# Patient Record
Sex: Female | Born: 1935 | Race: White | Hispanic: No | Marital: Married | Smoking: Former smoker
Health system: Southern US, Community
[De-identification: ages and names within clinical notes are randomized; demographics above are authoritative.]

## PROBLEM LIST (undated history)

## (undated) DIAGNOSIS — I10 Essential (primary) hypertension: Secondary | ICD-10-CM

## (undated) DIAGNOSIS — E7800 Pure hypercholesterolemia, unspecified: Secondary | ICD-10-CM

## (undated) DIAGNOSIS — I499 Cardiac arrhythmia, unspecified: Secondary | ICD-10-CM

## (undated) DIAGNOSIS — J449 Chronic obstructive pulmonary disease, unspecified: Secondary | ICD-10-CM

## (undated) DIAGNOSIS — C50919 Malignant neoplasm of unspecified site of unspecified female breast: Secondary | ICD-10-CM

## (undated) DIAGNOSIS — E538 Deficiency of other specified B group vitamins: Secondary | ICD-10-CM

## (undated) DIAGNOSIS — K551 Chronic vascular disorders of intestine: Secondary | ICD-10-CM

## (undated) DIAGNOSIS — G4730 Sleep apnea, unspecified: Secondary | ICD-10-CM

## (undated) DIAGNOSIS — E039 Hypothyroidism, unspecified: Secondary | ICD-10-CM

## (undated) DIAGNOSIS — G709 Myoneural disorder, unspecified: Secondary | ICD-10-CM

## (undated) DIAGNOSIS — I219 Acute myocardial infarction, unspecified: Secondary | ICD-10-CM

## (undated) DIAGNOSIS — Z87442 Personal history of urinary calculi: Secondary | ICD-10-CM

## (undated) DIAGNOSIS — C4490 Unspecified malignant neoplasm of skin, unspecified: Secondary | ICD-10-CM

## (undated) DIAGNOSIS — F419 Anxiety disorder, unspecified: Secondary | ICD-10-CM

## (undated) DIAGNOSIS — N289 Disorder of kidney and ureter, unspecified: Secondary | ICD-10-CM

## (undated) DIAGNOSIS — I2510 Atherosclerotic heart disease of native coronary artery without angina pectoris: Secondary | ICD-10-CM

## (undated) DIAGNOSIS — Z950 Presence of cardiac pacemaker: Secondary | ICD-10-CM

## (undated) DIAGNOSIS — F32A Depression, unspecified: Secondary | ICD-10-CM

## (undated) DIAGNOSIS — I6529 Occlusion and stenosis of unspecified carotid artery: Secondary | ICD-10-CM

## (undated) DIAGNOSIS — R32 Unspecified urinary incontinence: Secondary | ICD-10-CM

## (undated) DIAGNOSIS — M1990 Unspecified osteoarthritis, unspecified site: Secondary | ICD-10-CM

## (undated) DIAGNOSIS — E119 Type 2 diabetes mellitus without complications: Secondary | ICD-10-CM

## (undated) DIAGNOSIS — F329 Major depressive disorder, single episode, unspecified: Secondary | ICD-10-CM

## (undated) HISTORY — PX: CHOLECYSTECTOMY: SHX55

## (undated) HISTORY — PX: TUBAL LIGATION: SHX77

## (undated) HISTORY — DX: Occlusion and stenosis of unspecified carotid artery: I65.29

## (undated) HISTORY — DX: Essential (primary) hypertension: I10

## (undated) HISTORY — DX: Unspecified malignant neoplasm of skin, unspecified: C44.90

## (undated) HISTORY — PX: APPENDECTOMY: SHX54

## (undated) HISTORY — PX: TONSILLECTOMY: SUR1361

## (undated) HISTORY — DX: Chronic obstructive pulmonary disease, unspecified: J44.9

## (undated) HISTORY — DX: Pure hypercholesterolemia, unspecified: E78.00

## (undated) HISTORY — PX: EYE SURGERY: SHX253

## (undated) HISTORY — DX: Atherosclerotic heart disease of native coronary artery without angina pectoris: I25.10

## (undated) HISTORY — DX: Hypothyroidism, unspecified: E03.9

## (undated) HISTORY — PX: AUGMENTATION MAMMAPLASTY: SUR837

## (undated) HISTORY — PX: CORONARY ANGIOPLASTY WITH STENT PLACEMENT: SHX49

## (undated) HISTORY — DX: Sleep apnea, unspecified: G47.30

## (undated) HISTORY — DX: Deficiency of other specified B group vitamins: E53.8

## (undated) HISTORY — PX: SKIN CANCER EXCISION: SHX779

## (undated) HISTORY — DX: Type 2 diabetes mellitus without complications: E11.9

## (undated) HISTORY — DX: Unspecified osteoarthritis, unspecified site: M19.90

---

## 1980-09-02 DIAGNOSIS — I219 Acute myocardial infarction, unspecified: Secondary | ICD-10-CM

## 1980-09-02 DIAGNOSIS — C50919 Malignant neoplasm of unspecified site of unspecified female breast: Secondary | ICD-10-CM

## 1980-09-02 HISTORY — DX: Malignant neoplasm of unspecified site of unspecified female breast: C50.919

## 1980-09-02 HISTORY — DX: Acute myocardial infarction, unspecified: I21.9

## 1980-09-02 HISTORY — PX: MASTECTOMY: SHX3

## 2004-07-24 ENCOUNTER — Ambulatory Visit: Payer: Self-pay | Admitting: Internal Medicine

## 2004-09-13 ENCOUNTER — Ambulatory Visit: Payer: Self-pay | Admitting: General Practice

## 2004-09-21 ENCOUNTER — Ambulatory Visit: Payer: Self-pay | Admitting: General Practice

## 2005-08-06 ENCOUNTER — Ambulatory Visit: Payer: Self-pay | Admitting: Internal Medicine

## 2005-11-06 ENCOUNTER — Emergency Department: Payer: Self-pay | Admitting: Emergency Medicine

## 2005-11-06 ENCOUNTER — Other Ambulatory Visit: Payer: Self-pay

## 2006-10-16 ENCOUNTER — Encounter: Admission: RE | Admit: 2006-10-16 | Discharge: 2006-10-16 | Payer: Self-pay | Admitting: Internal Medicine

## 2006-10-27 ENCOUNTER — Ambulatory Visit: Payer: Self-pay | Admitting: Internal Medicine

## 2006-10-31 ENCOUNTER — Ambulatory Visit: Payer: Self-pay | Admitting: Cardiology

## 2007-01-20 ENCOUNTER — Ambulatory Visit (HOSPITAL_BASED_OUTPATIENT_CLINIC_OR_DEPARTMENT_OTHER): Admission: RE | Admit: 2007-01-20 | Discharge: 2007-01-20 | Payer: Self-pay | Admitting: Orthopaedic Surgery

## 2007-03-24 ENCOUNTER — Ambulatory Visit (HOSPITAL_BASED_OUTPATIENT_CLINIC_OR_DEPARTMENT_OTHER): Admission: RE | Admit: 2007-03-24 | Discharge: 2007-03-24 | Payer: Self-pay | Admitting: Orthopaedic Surgery

## 2007-05-13 ENCOUNTER — Ambulatory Visit: Payer: Self-pay | Admitting: Unknown Physician Specialty

## 2007-06-17 ENCOUNTER — Ambulatory Visit: Payer: Self-pay | Admitting: Unknown Physician Specialty

## 2007-09-15 ENCOUNTER — Other Ambulatory Visit: Payer: Self-pay

## 2007-09-15 ENCOUNTER — Inpatient Hospital Stay: Payer: Self-pay | Admitting: Internal Medicine

## 2007-10-30 ENCOUNTER — Ambulatory Visit: Payer: Self-pay | Admitting: Internal Medicine

## 2008-02-23 ENCOUNTER — Ambulatory Visit: Payer: Self-pay | Admitting: Otolaryngology

## 2008-03-15 ENCOUNTER — Ambulatory Visit: Payer: Self-pay | Admitting: Unknown Physician Specialty

## 2008-09-02 HISTORY — PX: CAROTID ARTERY ANGIOPLASTY: SHX1300

## 2008-09-02 HISTORY — PX: CAROTID ENDARTERECTOMY: SUR193

## 2008-09-29 ENCOUNTER — Emergency Department: Payer: Self-pay | Admitting: Emergency Medicine

## 2008-11-09 ENCOUNTER — Ambulatory Visit: Payer: Self-pay | Admitting: Internal Medicine

## 2008-12-06 ENCOUNTER — Ambulatory Visit: Payer: Self-pay | Admitting: Internal Medicine

## 2009-06-29 ENCOUNTER — Ambulatory Visit: Payer: Self-pay | Admitting: Internal Medicine

## 2009-07-05 ENCOUNTER — Ambulatory Visit: Payer: Self-pay | Admitting: Internal Medicine

## 2009-12-14 ENCOUNTER — Ambulatory Visit: Payer: Self-pay | Admitting: Internal Medicine

## 2010-01-02 ENCOUNTER — Ambulatory Visit: Payer: Self-pay | Admitting: Vascular Surgery

## 2010-01-31 ENCOUNTER — Ambulatory Visit: Payer: Self-pay | Admitting: Vascular Surgery

## 2010-02-07 ENCOUNTER — Inpatient Hospital Stay: Payer: Self-pay | Admitting: Vascular Surgery

## 2010-04-02 ENCOUNTER — Emergency Department: Payer: Self-pay | Admitting: Emergency Medicine

## 2010-06-11 ENCOUNTER — Ambulatory Visit: Payer: Self-pay | Admitting: Otolaryngology

## 2010-07-23 ENCOUNTER — Ambulatory Visit: Payer: Self-pay | Admitting: Internal Medicine

## 2010-07-25 ENCOUNTER — Ambulatory Visit: Payer: Self-pay | Admitting: Internal Medicine

## 2010-09-23 ENCOUNTER — Encounter: Payer: Self-pay | Admitting: Internal Medicine

## 2011-01-15 NOTE — Op Note (Signed)
NAME:  Cassandra [name], Cassandra [name]            ACCOUNT NO.:  [id number]   MEDICAL RECORD NO.:  [id number]          PATIENT TYPE:  AMB   LOCATION:  [place]                          FACILITY:  [facility]   PHYSICIAN:  Rennis Harding, M.D.        DATE OF BIRTH:  Jan 23, 1933   DATE OF PROCEDURE:  03/24/2007  DATE OF DISCHARGE:                               OPERATIVE REPORT   DIAGNOSIS:  Lumbar degenerative disk disease and spinal stenosis.   PROCEDURE:  1. Right L4-5 transforaminal epidural steroid injection.  2. Fluoroscopic imaging used for needle placement of the above      injection.   SURGEON:  Rennis Harding, M.D.   ASSISTANT:  None.   ANESTHESIA:  Monitored anesthesia care plus local.   COMPLICATIONS:  None.   INDICATION:  The patient is a pleasant 77 year old female with lumbar  spondylosis and spinal stenosis.  She has had good results with the  previous epidural injection.  She now presents for a repeat injection,  right L4-5 transforaminal.  We chose the right side because the patient  clearly indicated this was the more painful side.  Risks, benefits and  alternatives were reviewed.  She would like to proceed.   PROCEDURE IN DETAIL:  After informed consent, she was taken to the  operating room.  She was turned prone.  The back was prepped and draped  in the usual sterile fashion.  She underwent sedation by anesthesia.  Fluoroscopy was brought into the field.  Oblique imaging was obtained of  the L4-5 segment.  Using a 22 gauge Quincke spinal needle, a  posterolateral approach was taken, placing the needle tip just under the  right L4 pedicle.  Aspiration showed no blood or CSF.  Injection of 1 mL  Omnipaque showed epidural spread without any intravascular uptake.  We  then injected a solution of 160 mg Depo-Medrol and 8 mL 1% preservative  free lidocaine.  The patient tolerated the procedure well.  No apparent  complications.  She was transferred to recovery in stable condition, her  IV  postinjection instructions with her.  Follow up in 2-3 weeks.      Rennis Harding, M.D.  Electronically Signed     MC/MEDQ  D:  03/24/2007  T:  03/24/2007  Job:  [id number]

## 2011-01-18 NOTE — Op Note (Signed)
NAME:  DAVIS, ROSEBUSH            ACCOUNT NO.:  [id number]   MEDICAL RECORD NO.:  [id number]          PATIENT TYPE:  AMB   LOCATION:  [place]                          FACILITY:  [facility]   PHYSICIAN:  Rennis Harding, M.D.        DATE OF BIRTH:  [birth year]   DATE OF PROCEDURE:  01/20/2007  DATE OF DISCHARGE:                               OPERATIVE REPORT   DIAGNOSIS:  Lumbar spinal stenosis.   PROCEDURES:  1. Right L4-5 transforaminal epidural steroid injection.  2. Fluoroscopic imaging used for needle placement of the above      injection.   SURGEON:  Rennis Harding, MD   ASSISTANT:  None.   ANESTHESIA:  Local.   COMPLICATIONS:  None.   INDICATIONS:  The patient is a 77 year old female with persistent back  and bilateral lower extremity pain.  Her imaging studies show spinal  stenosis, most advanced at L4-5.  She now presents for epidural steroid  injection in hopes of improving her symptoms.  The risks, benefits and  alternatives were reviewed.   PROCEDURE:  After informed consent, she was taken to the operating room.  She was turned prone.  Her back prepped and draped in the usual sterile  fashion.  Fluoroscopy was brought into the field.  The L4-5 level was  identified and oblique images were obtained.  Using a posterior oblique  approach, a 22 gauge, 5 inch spinal needle was advanced underneath the  L4 pedicle.  Aspiration showed no blood or CSF.  A small amount of  Omnipaque was injected which showed some epidural spread.  We then  injected a solution of 80 mg Depo-Medrol and 4 mL 1% preservative-free  lidocaine.   The patient tolerated the procedure well.  No apparent complications.  She was transferred to recovery in stable condition neurologically  intact.  I reviewed post injection instructions with her.  She will  followup in two weeks.      Rennis Harding, M.D.  Electronically Signed     MC/MEDQ  D:  01/20/2007  T:  01/20/2007  Job:  [id number]

## 2011-04-19 ENCOUNTER — Ambulatory Visit: Payer: Self-pay | Admitting: Physician Assistant

## 2011-04-24 ENCOUNTER — Ambulatory Visit: Payer: Self-pay | Admitting: Internal Medicine

## 2011-04-26 ENCOUNTER — Observation Stay: Payer: Self-pay | Admitting: Surgery

## 2011-04-30 LAB — PATHOLOGY REPORT

## 2011-06-17 LAB — I-STAT EC8
Acid-Base Excess: 1
BUN: 18
Bicarbonate: 27.5 — ABNORMAL HIGH
Chloride: 103
Operator id: 123621
Potassium: 4
pH, Arterial: 7.362

## 2011-06-17 LAB — I-STAT 8, (EC8 V) (CONVERTED LAB)
Bicarbonate: 27.9 — ABNORMAL HIGH
Hemoglobin: 16 — ABNORMAL HIGH
Operator id: 123621
Sodium: 138
TCO2: 29
pCO2, Ven: 48.1
pH, Ven: 7.371 — ABNORMAL HIGH

## 2011-07-01 ENCOUNTER — Ambulatory Visit: Payer: Self-pay | Admitting: Otolaryngology

## 2011-07-02 ENCOUNTER — Ambulatory Visit: Payer: Self-pay | Admitting: Otolaryngology

## 2011-08-08 ENCOUNTER — Ambulatory Visit: Payer: Self-pay | Admitting: Internal Medicine

## 2012-04-23 ENCOUNTER — Ambulatory Visit: Payer: Self-pay | Admitting: Internal Medicine

## 2012-04-29 ENCOUNTER — Other Ambulatory Visit: Payer: Self-pay | Admitting: Vascular Surgery

## 2012-04-29 LAB — CBC WITH DIFFERENTIAL/PLATELET
Basophil #: 0.1 10*3/uL (ref 0.0–0.1)
Eosinophil #: 0.3 10*3/uL (ref 0.0–0.7)
HCT: 41.6 % (ref 35.0–47.0)
HGB: 13.6 g/dL (ref 12.0–16.0)
Lymphocyte #: 3.2 10*3/uL (ref 1.0–3.6)
Lymphocyte %: 24.1 %
MCHC: 32.7 g/dL (ref 32.0–36.0)
MCV: 83 fL (ref 80–100)
Monocyte %: 6.9 %
RDW: 15.2 % — ABNORMAL HIGH (ref 11.5–14.5)
WBC: 13.4 10*3/uL — ABNORMAL HIGH (ref 3.6–11.0)

## 2012-04-29 LAB — SEDIMENTATION RATE: Erythrocyte Sed Rate: 18 mm/hr (ref 0–30)

## 2012-05-11 ENCOUNTER — Ambulatory Visit: Payer: Self-pay | Admitting: Vascular Surgery

## 2012-05-11 DIAGNOSIS — I2510 Atherosclerotic heart disease of native coronary artery without angina pectoris: Secondary | ICD-10-CM

## 2012-05-11 LAB — URINALYSIS, COMPLETE
Bacteria: NONE SEEN
Bilirubin,UR: NEGATIVE
Blood: NEGATIVE
Ph: 5 (ref 4.5–8.0)
Specific Gravity: 1.008 (ref 1.003–1.030)
Squamous Epithelial: <1

## 2012-05-11 LAB — CBC
HGB: 13.2 g/dL (ref 12.0–16.0)
MCH: 27 pg (ref 26.0–34.0)
MCHC: 32.7 g/dL (ref 32.0–36.0)
RBC: 4.89 10*6/uL (ref 3.80–5.20)

## 2012-05-11 LAB — BASIC METABOLIC PANEL
BUN: 19 mg/dL — ABNORMAL HIGH (ref 7–18)
Calcium, Total: 9.5 mg/dL (ref 8.5–10.1)
Creatinine: 0.95 mg/dL (ref 0.60–1.30)
EGFR (African American): >60
EGFR (Non-African Amer.): 58 — ABNORMAL LOW
Glucose: 58 mg/dL — ABNORMAL LOW (ref 65–99)
Potassium: 3.7 mmol/L (ref 3.5–5.1)
Sodium: 141 mmol/L (ref 136–145)

## 2012-05-11 LAB — MRSA PCR SCREENING

## 2012-05-11 LAB — PROTIME-INR: Prothrombin Time: 12.8 secs (ref 11.5–14.7)

## 2012-05-20 ENCOUNTER — Inpatient Hospital Stay: Payer: Self-pay | Admitting: Vascular Surgery

## 2012-05-21 LAB — BASIC METABOLIC PANEL
BUN: 12 mg/dL (ref 7–18)
Creatinine: 0.78 mg/dL (ref 0.60–1.30)
EGFR (Non-African Amer.): >60
Glucose: 237 mg/dL — ABNORMAL HIGH (ref 65–99)
Osmolality: 285 (ref 275–301)
Potassium: 4.3 mmol/L (ref 3.5–5.1)
Sodium: 139 mmol/L (ref 136–145)

## 2012-05-21 LAB — CBC WITH DIFFERENTIAL/PLATELET
Basophil #: 0.1 10*3/uL (ref 0.0–0.1)
Eosinophil #: 0 10*3/uL (ref 0.0–0.7)
Lymphocyte #: 2.4 10*3/uL (ref 1.0–3.6)
MCH: 27.3 pg (ref 26.0–34.0)
MCHC: 32.7 g/dL (ref 32.0–36.0)
MCV: 84 fL (ref 80–100)
Monocyte #: 1.5 x10 3/mm — ABNORMAL HIGH (ref 0.2–0.9)
Neutrophil %: 81.6 %
Platelet: 340 10*3/uL (ref 150–440)
RDW: 14.9 % — ABNORMAL HIGH (ref 11.5–14.5)

## 2012-05-21 LAB — PROTIME-INR: INR: 1.1

## 2012-05-21 LAB — TROPONIN I
Troponin-I: <0.02 ng/mL
Troponin-I: <0.02 ng/mL

## 2012-05-21 LAB — APTT: Activated PTT: 27.5 secs (ref 23.6–35.9)

## 2012-05-22 LAB — CBC WITH DIFFERENTIAL/PLATELET
HCT: 38.7 % (ref 35.0–47.0)
Lymphocyte %: 15.6 %
Monocyte #: 1.4 x10 3/mm — ABNORMAL HIGH (ref 0.2–0.9)
Monocyte %: 8.1 %
Neutrophil #: 13.4 10*3/uL — ABNORMAL HIGH (ref 1.4–6.5)
RDW: 15.2 % — ABNORMAL HIGH (ref 11.5–14.5)
WBC: 17.9 10*3/uL — ABNORMAL HIGH (ref 3.6–11.0)

## 2012-05-22 LAB — PATHOLOGY REPORT

## 2012-05-22 LAB — BASIC METABOLIC PANEL
Anion Gap: 7 (ref 7–16)
BUN: 11 mg/dL (ref 7–18)
Chloride: 103 mmol/L (ref 98–107)
EGFR (African American): >60
Osmolality: 279 (ref 275–301)

## 2012-05-23 LAB — CBC WITH DIFFERENTIAL/PLATELET
Basophil %: 0.7 %
Eosinophil #: 0.2 10*3/uL (ref 0.0–0.7)
HGB: 11.4 g/dL — ABNORMAL LOW (ref 12.0–16.0)
MCH: 27.3 pg (ref 26.0–34.0)
MCHC: 33.8 g/dL (ref 32.0–36.0)
MCV: 81 fL (ref 80–100)
Monocyte #: 1.5 x10 3/mm — ABNORMAL HIGH (ref 0.2–0.9)
Neutrophil #: 10.7 10*3/uL — ABNORMAL HIGH (ref 1.4–6.5)
RBC: 4.19 10*6/uL (ref 3.80–5.20)

## 2012-05-23 LAB — BASIC METABOLIC PANEL
Anion Gap: 10 (ref 7–16)
Calcium, Total: 8.3 mg/dL — ABNORMAL LOW (ref 8.5–10.1)
Co2: 27 mmol/L (ref 21–32)
EGFR (African American): >60
Osmolality: 282 (ref 275–301)
Sodium: 139 mmol/L (ref 136–145)

## 2012-08-13 ENCOUNTER — Ambulatory Visit: Payer: Self-pay | Admitting: Internal Medicine

## 2013-02-11 ENCOUNTER — Ambulatory Visit: Payer: Self-pay | Admitting: Internal Medicine

## 2013-04-21 ENCOUNTER — Ambulatory Visit: Payer: Self-pay | Admitting: General Practice

## 2013-05-18 DIAGNOSIS — M5136 Other intervertebral disc degeneration, lumbar region: Secondary | ICD-10-CM | POA: Insufficient documentation

## 2013-05-18 DIAGNOSIS — M48061 Spinal stenosis, lumbar region without neurogenic claudication: Secondary | ICD-10-CM | POA: Insufficient documentation

## 2013-05-18 DIAGNOSIS — M47816 Spondylosis without myelopathy or radiculopathy, lumbar region: Secondary | ICD-10-CM | POA: Insufficient documentation

## 2013-05-24 ENCOUNTER — Ambulatory Visit: Payer: Self-pay | Admitting: Vascular Surgery

## 2013-05-26 ENCOUNTER — Ambulatory Visit: Payer: Self-pay | Admitting: Vascular Surgery

## 2013-07-22 ENCOUNTER — Ambulatory Visit: Payer: Self-pay | Admitting: Specialist

## 2013-08-20 ENCOUNTER — Ambulatory Visit: Payer: Self-pay | Admitting: Specialist

## 2013-11-19 ENCOUNTER — Ambulatory Visit: Payer: Self-pay | Admitting: Internal Medicine

## 2014-01-06 DIAGNOSIS — G4730 Sleep apnea, unspecified: Secondary | ICD-10-CM

## 2014-01-06 DIAGNOSIS — J449 Chronic obstructive pulmonary disease, unspecified: Secondary | ICD-10-CM | POA: Insufficient documentation

## 2014-01-06 DIAGNOSIS — J4489 Other specified chronic obstructive pulmonary disease: Secondary | ICD-10-CM

## 2014-01-06 DIAGNOSIS — E785 Hyperlipidemia, unspecified: Secondary | ICD-10-CM | POA: Insufficient documentation

## 2014-01-06 DIAGNOSIS — R519 Headache, unspecified: Secondary | ICD-10-CM | POA: Insufficient documentation

## 2014-01-06 DIAGNOSIS — I10 Essential (primary) hypertension: Secondary | ICD-10-CM

## 2014-01-06 DIAGNOSIS — G4710 Hypersomnia, unspecified: Secondary | ICD-10-CM | POA: Insufficient documentation

## 2014-01-06 DIAGNOSIS — E7800 Pure hypercholesterolemia, unspecified: Secondary | ICD-10-CM

## 2014-01-06 DIAGNOSIS — I2510 Atherosclerotic heart disease of native coronary artery without angina pectoris: Secondary | ICD-10-CM | POA: Insufficient documentation

## 2014-01-06 DIAGNOSIS — E039 Hypothyroidism, unspecified: Secondary | ICD-10-CM

## 2014-01-06 DIAGNOSIS — E118 Type 2 diabetes mellitus with unspecified complications: Secondary | ICD-10-CM | POA: Insufficient documentation

## 2014-01-06 DIAGNOSIS — R51 Headache: Secondary | ICD-10-CM

## 2014-01-06 DIAGNOSIS — E119 Type 2 diabetes mellitus without complications: Secondary | ICD-10-CM | POA: Insufficient documentation

## 2014-01-06 HISTORY — DX: Essential (primary) hypertension: I10

## 2014-01-06 HISTORY — DX: Chronic obstructive pulmonary disease, unspecified: J44.9

## 2014-01-06 HISTORY — DX: Pure hypercholesterolemia, unspecified: E78.00

## 2014-01-06 HISTORY — DX: Other specified chronic obstructive pulmonary disease: J44.89

## 2014-01-06 HISTORY — DX: Atherosclerotic heart disease of native coronary artery without angina pectoris: I25.10

## 2014-01-06 HISTORY — DX: Hypothyroidism, unspecified: E03.9

## 2014-02-08 ENCOUNTER — Ambulatory Visit: Payer: Self-pay | Admitting: Internal Medicine

## 2014-02-15 DIAGNOSIS — E538 Deficiency of other specified B group vitamins: Secondary | ICD-10-CM | POA: Insufficient documentation

## 2014-02-15 HISTORY — DX: Deficiency of other specified B group vitamins: E53.8

## 2014-02-23 ENCOUNTER — Ambulatory Visit: Payer: Self-pay | Admitting: Vascular Surgery

## 2014-02-23 DIAGNOSIS — R0989 Other specified symptoms and signs involving the circulatory and respiratory systems: Secondary | ICD-10-CM

## 2014-02-23 LAB — CBC
HCT: 40.6 % (ref 35.0–47.0)
HGB: 13.1 g/dL (ref 12.0–16.0)
MCH: 26.9 pg (ref 26.0–34.0)
MCHC: 32.3 g/dL (ref 32.0–36.0)
MCV: 83 fL (ref 80–100)
Platelet: 335 10*3/uL (ref 150–440)
RBC: 4.87 10*6/uL (ref 3.80–5.20)
RDW: 16.2 % — ABNORMAL HIGH (ref 11.5–14.5)
WBC: 12.3 10*3/uL — AB (ref 3.6–11.0)

## 2014-02-23 LAB — BASIC METABOLIC PANEL
Anion Gap: 7 (ref 7–16)
BUN: 16 mg/dL (ref 7–18)
CALCIUM: 9.3 mg/dL (ref 8.5–10.1)
CREATININE: 0.75 mg/dL (ref 0.60–1.30)
Chloride: 103 mmol/L (ref 98–107)
Co2: 30 mmol/L (ref 21–32)
GLUCOSE: 70 mg/dL (ref 65–99)
Osmolality: 279 (ref 275–301)
POTASSIUM: 3.9 mmol/L (ref 3.5–5.1)
Sodium: 140 mmol/L (ref 136–145)

## 2014-03-03 ENCOUNTER — Ambulatory Visit: Payer: Self-pay | Admitting: Vascular Surgery

## 2014-03-09 LAB — PATHOLOGY REPORT

## 2014-05-20 DIAGNOSIS — E559 Vitamin D deficiency, unspecified: Secondary | ICD-10-CM | POA: Insufficient documentation

## 2014-05-24 DIAGNOSIS — Z794 Long term (current) use of insulin: Secondary | ICD-10-CM | POA: Insufficient documentation

## 2014-08-01 ENCOUNTER — Ambulatory Visit: Payer: Self-pay | Admitting: Unknown Physician Specialty

## 2014-09-25 ENCOUNTER — Emergency Department: Payer: Self-pay | Admitting: Emergency Medicine

## 2014-12-19 ENCOUNTER — Ambulatory Visit (INDEPENDENT_AMBULATORY_CARE_PROVIDER_SITE_OTHER): Payer: Medicare Other

## 2014-12-19 ENCOUNTER — Encounter: Payer: Self-pay | Admitting: Podiatry

## 2014-12-19 ENCOUNTER — Ambulatory Visit (INDEPENDENT_AMBULATORY_CARE_PROVIDER_SITE_OTHER): Payer: Medicare Other | Admitting: Podiatry

## 2014-12-19 VITALS — BP 152/66 | HR 67 | Resp 18 | Ht 62.0 in | Wt 197.0 lb

## 2014-12-19 DIAGNOSIS — M79673 Pain in unspecified foot: Secondary | ICD-10-CM | POA: Diagnosis not present

## 2014-12-19 DIAGNOSIS — E119 Type 2 diabetes mellitus without complications: Secondary | ICD-10-CM

## 2014-12-19 NOTE — Progress Notes (Signed)
   Subjective:    Patient ID: Cassandra [name], female    DOB: 1933/06/06, 81 y.o.   MRN: [id number] Pt comes in today and would like to have a diabetic foot ck.  HPI    Review of Systems  HENT: Positive for sneezing.   Respiratory: Positive for shortness of breath and wheezing.   Hematological: Bruises/bleeds easily.  All other systems reviewed and are negative.      Objective:   Physical Exam: I have reviewed her past medical history medications allergy surgery social history and review of systems. Pulses are strongly palpable bilateral. Neurologic sensorium is intact per Semmes-Weinstein monofilament. Deep tendon reflexes are intact bilaterally muscle strength is 5 over 5 dorsiflexion plantar flexors and inverters and everters on physical musculatures intact. Orthopedic evaluation Mr. tall joints distal to the ankle full range of motion without crepitation. She does have mildly rigid hammertoe deformities 2 through 5 bilateral. Active hyperkeratosis or present to the bilateral foot particularly the digits. I see no open lesions and no signs of infection. Radiographic evaluation demonstrates normal osseous architecture with mild osteopenia. I see no osseous lesions.        Assessment & Plan:  Assessment: Hammertoe deformities with reactive hyperkeratosis bilateral. Diabetes mellitus type 2 without complications.  Plan: Debridement of all reactive hyperkeratosis. Follow-up with her as needed.

## 2014-12-20 NOTE — Discharge Summary (Signed)
PATIENT NAME:  Cassandra [name], ACHEY MR#:  [id number] DATE OF BIRTH:  1933/04/12  DATE OF ADMISSION:  05/20/2012 DATE OF DISCHARGE:  05/23/2012  FINAL DIAGNOSES:  1. 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  2. Pulmonary edema.   PROCEDURE PERFORMED DURING THIS ADMISSION: Left neck exploration with resection of the left carotid artery and infected patch with a common carotid artery to internal carotid artery  bypass with a graft. The procedure was done by Dr. Leotis Pain on 05/20/2012.   COMPLICATIONS: The patient developed pulmonary edema postoperatively.   CONSULTATIONS:  Dr. Heath Lark COURSE: The patient was admitted.  She underwent her procedure on [id number] with the complication of flash pulmonary edema postoperatively. The patient was transferred to the Critical Care Unit where she was given Lasix postoperatively. She did receive preoperative and postoperative IV antibiotics and required no transfusions during the admission. The patient's left carotid incision remained clean, dry, and intact. She was distally and neurovascularly intact. She progressed in her therapy for her edema as well as her wound care and was deemed stable for discharge on 05/23/2012.   LABORATORY DATA: As of 05/23/2012: Hemoglobin 11.4, hematocrit 33.9. Chemistries appeared stable.   DISCHARGE MEDICATIONS:  1. Citalopram 40 mg oral tablet.  2. Lovastatin 40 mg oral tablet.  3. Metformin 500 mg oral tablet.  4. Furosemide 40 mg oral tablet.  5. Lantus 100 units subcutaneous.  6. NovoLog Flex pen 100 units.  7. Lorazepam 1 mg oral tablet.  8. Ropinirole 1 mg oral tablet.  9. Methocarbamol 750 mg.  10. Magnesium oxide 250 mg.  11. Vitamin B12 1000 mg oral tablet.  12. Vitamin B6 100 mg.  13. Metoprolol 25 mg.  14. Aspirin 81 mg oral tablet.   DISPOSITION: The patient was discharged home in stable condition on 05/23/2012. She will continue her walking as well as physical therapy if  needed. The patient does have a follow-up appointment scheduled with [facility] in a few weeks postoperatively. The patient understands to call in the interim with any problems or questions.     ____________________________ Lane Hacker, PA-C jmk:bjt D: 06/01/2012 07:22:29 ET T: 06/01/2012 10:24:11 ET JOB#: [id number]  cc: Lane Hacker, PA-C, <Dictator> Lane Hacker PA ELECTRONICALLY SIGNED 06/03/2012 16:34

## 2014-12-20 NOTE — Consult Note (Signed)
PATIENT NAME:  Cassandra [name], Cassandra [name] MR#:  [id number] DATE OF BIRTH:  [birth year]  DATE OF CONSULTATION:  05/20/2012  REFERRING PHYSICIAN:   CONSULTING PHYSICIAN:  Ceasar Lund. Anselm Jungling, MD  ADDENDUM  HOME MEDICATIONS:  1. Vitamin B6 100 mg 1 tablet once a day. 2. Vitamin B12 1000 mcg 1 tablet once a day. 3. Ropinirole 1 mg oral tablet once a day. 4. NovoLog 6 units subcutaneous 3 times a day as needed after checking blood sugar level. 5. Metoprolol 25 mg 2 times a day.  6. Methocarbamol 750 mg 2 times a day. 7. Metformin 500 mg oral 2 times a day. 8. Magnesium oxide 250 mg oral tablet once a day. 9. Lovastatin 40 mg once a day. 10. Lorazepam 1 mg once a day. 11. Lantus 42 units subcutaneous at night.  12. Furosemide 40 mg once a day.  13. Citalopram 40 mg once a day. 14. Aspirin 81 mg once a day.  ____________________________ Ceasar Lund. Anselm Jungling, MD vgv:cms D: 05/20/2012 22:18:56 ET T: 05/21/2012 12:54:32 ET JOB#: [id number]  cc: Ceasar Lund. Anselm Jungling, MD, <Dictator> Vaughan Basta MD ELECTRONICALLY SIGNED 05/26/2012 15:30

## 2014-12-20 NOTE — Consult Note (Signed)
PATIENT NAME:  Cassandra [name], Cassandra [name] MR#:  [id number] DATE OF BIRTH:  12-31-1932  DATE OF CONSULTATION:  05/20/2012  REFERRING PHYSICIAN:  Leotis Pain, MD CONSULTING PHYSICIAN:  Ceasar Lund. Anselm Jungling, MD  REASON FOR CONSULTATION: Pulmonary edema, postoperative.   HISTORY OF PRESENTATION: This is a 81 year old female with past medical history of hypertension, diabetes, hyperlipidemia, left carotid artery surgery and infection who was admitted today for revision of her left carotid artery surgery and after extubation, in postoperative period, she was noticed having hypoxia. On x-ray it was evident that she has flash pulmonary edema and she was transferred to Critical Care Unit for further management. She was given IV Lasix and monitored in Critical Care Unit with oxygen delivered by mask. In her CCU stay, she improved secondary to diuresis. Medical consult is called for further management.   REVIEW OF SYSTEMS: CONSTITUTIONAL: She denies any weakness, fever, weight loss, or fatigue. HEAD/NECK: Denies any trauma, any eye pain or discharge, or any ear pain. NECK: Denies any pain or swelling. RESPIRATORY: Denies any feeling of shortness of breath or any cough. CARDIOVASCULAR: Denies any palpitation, chest pain, or any edema of the legs. GASTROINTESTINAL: Denies any nausea, vomiting, diarrhea, or abdominal pain. No blood in the urine or blood in the vomit. GENITOURINARY: Denies any frequency of the urine, denies any blood or burning in the urine. NEUROLOGICAL: Denies any weakness or numbness or tingling of the arms or legs   PAST MEDICAL HISTORY:  1. Hypertension.  2. Diabetes mellitus.  3. Carotid artery disease.  4. Restless leg syndrome. 5. Hypothyroidism. 6. Hyperlipidemia.  PAST SURGICAL HISTORY:  1. Carotid endarterectomy.  2. Gallbladder surgery. 3. Ligamental tear in knee.   ALLERGIES: No known drug allergies.   SOCIAL HISTORY: She denies smoking or drinking alcohol   FAMILY HISTORY:  Extensive coronary artery disease in her siblings and her father and diabetic history in her mother and her siblings.   PHYSICAL EXAMINATION:  VITALS: Currently temperature is 97.6, pulse rate 68, respirations 12, blood pressure 140/68, and pulse oximetry 94% with 3 liters nasal cannula.  GENERAL: Appears in no acute distress, reading comfortably while sitting in the bed.  HEAD/NECK: Conjunctivae pink. Oral mucosa moist. No icterus.   NECK: Left side of the neck with surgical stitches present. No JVD. Neck supple.   RESPIRATORY: Bilateral clear and equal air entry. No wheezing or rhonchi.   CARDIOVASCULAR: S1 and S2 normal, systolic murmur present   ABDOMEN: Soft and nontender, bowel sounds present. No organomegaly felt.   EXTREMITIES: No edema of the extremities.  SKIN: No rash.   NEUROLOGICAL: Follows commands. No tremor. No weakness or sensory loss.   LABS/RADIOLOGIC STUDIES: There are no current lab results in this admission, but as per the previous records available she had no echocardiogram done until now.   ASSESSMENT AND RECOMMENDATIONS:  1. Pulmonary edema, postoperative, as evident by x-ray, status post IV Lasix 40 mg total and had good diuresis up to 1.5 liters, feeling much better now on nasal cannula with 3 liters oxygen. We will get echocardiogram of her heart done as having extensive cardiac history in the family and carotid artery disease. Continue Lasix p.o. 40 mg daily. 2. Hypertension. Recommend continuation of her home medications of metoprolol and Lasix 40 mg daily.  3. Diabetes mellitus. Can monitor now on insulin sliding scale. She was taking Lantus 42 units daily at night, so can be started as her sugar tends to be higher.  4. Restless leg syndrome. Continue  Ropinirole. 5. Hyperlipidemia. Continue lovastatin 40 mg once a day.  6. Family history of coronary artery disease and she is a diabetic and hypertensive. Continue aspirin 81 mg daily.   TOTAL TIME SPENT  IN CONSULTATION: 45 minutes. ____________________________ Ceasar Lund Anselm Jungling, MD vgv:slb D: 05/20/2012 22:10:02 ET T: 05/21/2012 12:42:44 ET JOB#: [id number]  cc: Ceasar Lund. Anselm Jungling, MD, <Dictator> Algernon Huxley, MD Vaughan Basta MD ELECTRONICALLY SIGNED 05/26/2012 15:30

## 2014-12-20 NOTE — Op Note (Signed)
PATIENT NAME:  Cassandra [name], Cassandra [name] MR#:  [id number] DATE OF BIRTH:  03/13/1933  DATE OF PROCEDURE:  05/20/2012  PREOPERATIVE DIAGNOSES:  1. Carotid artery stenosis.  2. 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  3. Diabetes.  4. Hypertension.  5. Hyperlipidemia.  POSTOPERATIVE DIAGNOSES:  1. Carotid artery stenosis.  2. 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  3. Diabetes.  4. Hypertension.  5. Hyperlipidemia.  PROCEDURES PERFORMED: 1. Left neck exploration.  2. Excision of infected left carotid artery and Dacron patch with CCA to ICA bypass with reverse saphenous vein graft.   SURGEON: Algernon Huxley, MD  ASSISTANT: Hortencia Pilar, MD  ANESTHESIA: General.   ESTIMATED BLOOD LOSS: Approximately 200 mL.   INDICATIONS FOR THE PROCEDURE: The patient is 81 year old white female with a history of carotid artery disease and multiple other issues. Since her carotid endarterectomy she has apparently had a gangrenous gallbladder as well as infection from a ruptured breast prosthesis that has been treated, however, she has developed swelling and CT scan findings concerning for infection with pseudoaneurysm formation of a Dacron patch in the left neck. For these reasons, we recommended surgical excision with removal of all foreign body.   DESCRIPTION OF PROCEDURE: The patient was brought to the operative suite and after an adequate level of general anesthesia was obtained the patient was placed in modified beach chair position, the left neck was sterilely prepped and draped, and a sterile surgical field was created, was the left groin. An incision was created along the anterior border of the sternocleidomastoid through the previous incision and we tediously dissected through platysma and retracted the sternocleidomastoid laterally. The pseudoaneurysm was identified. I  dissected out the common carotid artery several centimeters proximal to this near the common carotid artery where it was reasonably normal. Dissection was quite tedious. We were able to dissect out the internal carotid artery a couple of centimeters beyond the inflammatory process as was the external carotid artery. The superior thyroid artery was ligated and divided between silk ties. Dr. Delana Meyer harvested the left great saphenous vein with a thigh incision and this was marked for orientation and prepared for use. The patient was systemically heparinized. We then opened the artery and the patch was removed in its entirety as was the pseudoaneurysm and the inflamed part of the carotid wall. The Pruitt-Inahara shunt was placed. We performed some completion endarterectomy at the proximal endpoint to get a nice proximal endpoint for our proximal anastomosis and the ICA was prepared for endpoint for our distal anastomosis. The vein was ligated out of the thigh and brought up to the field. Distal anastomosis was started with a 6-0 Prolene suture in the back wall and run out medially and laterally to the halfway point. At this point, the Pruitt-Inahara shunt was removed after we had excised all infected tissue. The suture line was finished vein. The vein was then cut to an appropriate length to match the location for our proximal anastomosis to the common carotid artery, beveled, and a 6-0 Prolene suture was started on the back wall for the proximal anastomosis. The anastomosis was created in the typical fashion. A single 6-0 Prolene patch suture was used for hemostasis in both proximal and distal end points with a small piece of the vein as a pledget, the remaining vein was a pledget, and we flushed several cardiac cycles prior to completing our suture lines. On  release of control there was a nice pulsatile flow in the internal carotid artery. The external carotid artery was doubly ligated. Surgicel and Evicyl topical  hemostatic agents were placed after we irrigated the wound and hemostasis was complete. The neck wound was then closed with three interrupted 3-0 Vicryl sutures in the deep space, the platysma was closed with a running 3-0 Vicryl, and the skin was closed with a 4-0 Monocryl. The groin was closed with a layer of 2-0 Vicryl, 3-0 Vicryl, and 4-0       Monocryl. A sterile dressing was placed. The patient was awakened from anesthesia and extubated following commands and moving all extremities. She was taken to the recovery room in stable condition. ____________________________ Algernon Huxley, MD jsd:slb D: 05/20/2012 15:21:54 ET T: 05/20/2012 16:13:45 ET JOB#: [id number]  cc: Algernon Huxley, MD, <Dictator> Leonie Douglas. [name], MD Algernon Huxley MD ELECTRONICALLY SIGNED 05/21/2012 8:20

## 2014-12-23 NOTE — Op Note (Signed)
PATIENT NAME:  Cassandra [name], Cassandra [name] MR#:  [id number] DATE OF BIRTH:  [birth year]  DATE OF PROCEDURE AND DATE OF DICTATION:  05/26/2013  PREOPERATIVE DIAGNOSES:  1. Large symptomatic cyst/seroma, left thigh.  2. Carotid artery stenosis.  3. Status post vein harvest from left leg.  4. Diabetes.   POSTOPERATIVE DIAGNOSES:  1. Large symptomatic cyst/seroma, left thigh.  2. Carotid artery stenosis.  3. Status post vein harvest from left leg.  4. Diabetes.   PROCEDURE: Ultrasound-guided aspiration of left thigh cyst/seroma.   SURGEON: Algernon Huxley, MD  ANESTHESIA: MAC.   ESTIMATED BLOOD LOSS: Minimal.   INDICATION FOR PROCEDURE: The patient has an enlarging seroma and fluid collection in the left thigh. Her saphenous vein harvest is somewhat remote, over a year ago, but the area is getting bigger, and she is now having pain and swelling in her leg. We are going to try to remove this cyst to help her symptoms. Risks and benefits were discussed. Informed consent was obtained.   DESCRIPTION OF PROCEDURE: The patient is brought back to the operative suite, and after an adequate level of intravenous sedation was obtained, the left thigh was sterilely prepped and draped, and a sterile surgical field was created. Ultrasound was used to visualize the fluid collection. It was then accessed under direct ultrasound guidance with a large-bore needle, and approximately 100 mL of very clear fluid returned immediately. At this point, on ultrasound, the cyst was decompressed. There was no obvious residual fluid. I removed the needle and wrapped the leg with an Ace bandage. The patient was then awakened from anesthesia and taken to the recovery room in stable condition, having tolerated the procedure well.   ____________________________ Algernon Huxley, MD jsd:OSi D: 05/26/2013 11:30:14 ET T: 05/26/2013 11:44:01 ET JOB#: [id number]  cc: Algernon Huxley, MD, <Dictator> Leonie Douglas. [name], MD Algernon Huxley  MD ELECTRONICALLY SIGNED 05/26/2013 12:59

## 2014-12-24 NOTE — Op Note (Signed)
PATIENT NAME:  Cassandra [name], Cassandra [name] MR#:  [id number] DATE OF BIRTH:  [birth year]  DATE OF PROCEDURE:  03/03/2014  PREOPERATIVE DIAGNOSES:  1. Symptomatic left thigh seroma with leg swelling and pain.  2. Carotid artery disease status post carotid endarterectomy and then carotid artery bypass with reversed greater saphenous vein harvested from the left leg.  3. Diabetes.   POSTOPERATIVE DIAGNOSES:  1. Symptomatic left thigh seroma with leg swelling and pain.  2. Carotid artery disease status post carotid endarterectomy and then carotid artery bypass with reversed greater saphenous vein harvested from the left leg.  3. Diabetes.   PROCEDURE:  Excision of left thigh seroma.   SURGEON:  Algernon Huxley, MD   ANESTHESIA: General.   BLOOD LOSS: Minimal.   INDICATION FOR PROCEDURE: This is a 81 year old female with an enlarging left thigh seroma.  This has been previously aspirated but has recurred and is larger. It is now causing compressive symptoms of swelling and pain in the leg. She desires to have this resected. Risks and benefits were discussed. Informed consent was obtained.   DESCRIPTION OF PROCEDURE: The patient is brought to the operative suite. After an adequate level of general anesthesia was obtained, the left lower extremity was sterilely prepped and draped and a sterile surgical field was created. An elliptical incision was created around the easily palpable seroma and we dissected this out.  It was then dissected out in its entirety.  A small nick was made and it was clear serous fluid that was seen with no signs of infection. The size of the lesion was approximately the size of a softball and this was dissected out in its entirety and resected and sent off for pathology. The wound was then dried up with electrocautery.  It was closed with a layer of 2-0 Vicryl, 2 layers of 3-0 Vicryl and 4-0 nylons were used on the skin in an interrupted fashion. Sterile dressing was placed. The patient  tolerated the procedure well and was taken to the recovery room in stable condition.    ____________________________ Algernon Huxley, MD jsd:dd D: 03/03/2014 15:43:13 ET T: 03/03/2014 21:47:57 ET JOB#: [id number]  cc: Algernon Huxley, MD, <Dictator> Leonie Douglas. [name], MD Algernon Huxley MD ELECTRONICALLY SIGNED 03/07/2014 15:47

## 2014-12-26 LAB — SURGICAL PATHOLOGY

## 2015-03-21 ENCOUNTER — Ambulatory Visit: Payer: Medicare Other | Admitting: Physical Therapy

## 2015-03-29 ENCOUNTER — Encounter: Payer: Medicare Other | Admitting: Physical Therapy

## 2015-04-03 ENCOUNTER — Encounter: Payer: Medicare Other | Admitting: Physical Therapy

## 2015-05-22 ENCOUNTER — Encounter: Payer: Self-pay | Admitting: Podiatry

## 2015-05-22 ENCOUNTER — Ambulatory Visit (INDEPENDENT_AMBULATORY_CARE_PROVIDER_SITE_OTHER): Payer: Medicare Other | Admitting: Podiatry

## 2015-05-22 ENCOUNTER — Telehealth: Payer: Self-pay | Admitting: Podiatry

## 2015-05-22 ENCOUNTER — Ambulatory Visit (INDEPENDENT_AMBULATORY_CARE_PROVIDER_SITE_OTHER): Payer: Medicare Other

## 2015-05-22 VITALS — BP 117/86 | HR 69 | Resp 12

## 2015-05-22 DIAGNOSIS — E1142 Type 2 diabetes mellitus with diabetic polyneuropathy: Secondary | ICD-10-CM | POA: Diagnosis not present

## 2015-05-22 DIAGNOSIS — M10071 Idiopathic gout, right ankle and foot: Secondary | ICD-10-CM

## 2015-05-22 DIAGNOSIS — R52 Pain, unspecified: Secondary | ICD-10-CM

## 2015-05-22 DIAGNOSIS — M779 Enthesopathy, unspecified: Secondary | ICD-10-CM

## 2015-05-22 MED ORDER — COLCHICINE 0.6 MG PO TABS: 0.6000 mg | ORAL_TABLET | Freq: Every day | ORAL | Status: DC

## 2015-05-22 NOTE — Progress Notes (Signed)
   Subjective:    Patient ID: Cassandra [name], female    DOB: [birth year], 81 y.o.   MRN: [id number]  HPI: She presents today with a month duration of pain to her right anterior ankle. She states that she has been to see the PCP who thinks he can possibly be gout but gave her only gabapentin to treat it.  Review of Systems  Cardiovascular: Positive for leg swelling.  Musculoskeletal: Positive for joint swelling.       Objective:   Physical Exam: 81 year old female chief complaint of pain to the right anterior ankle vital signs stable she is alert and oriented 3 in no acute distress. Pulses are palpable bilateral. Neurologic sensorium is slightly diminished per Semmes-Weinstein monofilament. Deep tendon reflexes are brisk and equal bilateral. Muscle strength is equal bilateral. Orthopedic evaluation demonstrates nonpulsatile mass to the anterior right ankle which is very warm to the touch. There is no skin opening or lesion or drainage. Radiographs demonstrate a lateral view anterior swelling as well as medial and lateral swelling on AP and oblique views. Cutaneous evaluation demonstrates supple well-hydrated acute is no erythema or edema saline as drainage or odor with exception of question area.      Assessment & Plan:  Capsulitis of the right ankle possible gouty arthritis right ankle.  Plan: Discussed etiology pathology conservative versus surgical therapies. At this point she denied any blood work and she declined an injection to the right foot. At this point I wrote a prescription for colchicine I feel that this is most closely related to gout. 0.6 mg 1 by mouth daily. I will follow up with her in 2-3 weeks

## 2015-05-22 NOTE — Telephone Encounter (Signed)
Pharmacy Rep called in about the price for Cocrys 0.6 MG tablets . 30 Tablets are $88.00 and that is a concern for the pt . Wanting too know if Dr. Milinda Pointer had something different too prescribe

## 2015-05-23 NOTE — Telephone Encounter (Signed)
Dr. Milinda Pointer,  Is there any other medication, I believe Colchicine is even more expensive.  Marcy Siren

## 2015-06-05 ENCOUNTER — Ambulatory Visit (INDEPENDENT_AMBULATORY_CARE_PROVIDER_SITE_OTHER): Payer: Medicare Other | Admitting: Podiatry

## 2015-06-05 ENCOUNTER — Encounter: Payer: Self-pay | Admitting: Podiatry

## 2015-06-05 VITALS — BP 130/69 | HR 78 | Resp 12

## 2015-06-05 DIAGNOSIS — M10071 Idiopathic gout, right ankle and foot: Secondary | ICD-10-CM | POA: Diagnosis not present

## 2015-06-05 DIAGNOSIS — L858 Other specified epidermal thickening: Secondary | ICD-10-CM | POA: Diagnosis not present

## 2015-06-05 DIAGNOSIS — L988 Other specified disorders of the skin and subcutaneous tissue: Secondary | ICD-10-CM

## 2015-06-05 NOTE — Progress Notes (Signed)
Cassandra [name] presents today for follow-up of her gouty capsulitis right ankle. She states that after that injection I am now 100% better. Other than some mild swelling I'm very happy. I also have question about a lesion on the lateral aspect of my right foot that appears to be very similar to 1 that resulted in excision of a large cancer on the left leg. I would like to have it removed if possible.  Objective: Vital signs are stable she is alert and oriented 3 pulses are palpable right foot. Moderate edema to the right lower extremity no erythema cellulitis drainage or odor. She has good range of motion of the joints particularly the ankle and subtalar joint without any symptoms. Cutaneous evaluation demonstrates supple well-hydrated cutis she does have a what appears to be a dysplastic nevus overlying the dorsolateral aspect of the right foot measuring less than 4 mm in diameter. To me this does not appear cancerous.  Assessment: Well-healing capsulitis/acute gout. Soft tissue lesion dorsolateral aspect of the right foot benign.  Plan: Discussed etiology pathology conservative versus surgical therapies. I suggest that she continue her non-steroidal and her gout medication. We performed a shave biopsy of the lesion on the right foot today and sent for pathologic evaluation. I will follow up with her once this comes in.    Roselind Messier DPM

## 2015-06-16 ENCOUNTER — Other Ambulatory Visit: Payer: Self-pay | Admitting: Internal Medicine

## 2015-06-16 DIAGNOSIS — Z1231 Encounter for screening mammogram for malignant neoplasm of breast: Secondary | ICD-10-CM

## 2015-06-22 ENCOUNTER — Ambulatory Visit
Admission: RE | Admit: 2015-06-22 | Discharge: 2015-06-22 | Disposition: A | Discharge status: Home / Self Care | Payer: Medicare Other | Source: Ambulatory Visit | Attending: Internal Medicine | Admitting: Internal Medicine

## 2015-06-22 ENCOUNTER — Other Ambulatory Visit: Payer: Self-pay | Admitting: Internal Medicine

## 2015-06-22 DIAGNOSIS — Z1231 Encounter for screening mammogram for malignant neoplasm of breast: Secondary | ICD-10-CM | POA: Diagnosis present

## 2015-06-22 HISTORY — DX: Malignant neoplasm of unspecified site of unspecified female breast: C50.919

## 2016-03-04 ENCOUNTER — Ambulatory Visit: Payer: Self-pay

## 2016-03-18 ENCOUNTER — Ambulatory Visit: Payer: Self-pay | Admitting: Urology

## 2016-04-03 ENCOUNTER — Ambulatory Visit (INDEPENDENT_AMBULATORY_CARE_PROVIDER_SITE_OTHER): Payer: Medicare Other | Admitting: Urology

## 2016-04-03 ENCOUNTER — Encounter: Payer: Self-pay | Admitting: Urology

## 2016-04-03 VITALS — BP 91/53 | HR 57 | Ht 62.0 in | Wt 195.0 lb

## 2016-04-03 DIAGNOSIS — N3946 Mixed incontinence: Secondary | ICD-10-CM | POA: Diagnosis not present

## 2016-04-03 NOTE — Progress Notes (Signed)
04/03/2016 4:22 PM   Cassandra [name] [birth year] [id number]  Referring provider: Idelle Crouch, MD [place] [facility] [place], [place] [address]  Chief Complaint  Patient presents with  . Urinary Incontinence    New Patient    HPI: For many months the patient has worsening urge incontinence. She sometimes leaks little bit when she coughs and sneezes. She will wear pads. She denies enuresis. She was every to 3 hours and has no nocturia  She is an insulin-dependent diabetic. Her bowel movements are normal. She's not had a hysterectomy  Modifying factors: There are no other modifying factors  Associated signs and symptoms: There are no other associated signs and symptoms Aggravating and relieving factors: There are no other aggravating or relieving factors Severity: Moderate Duration: Persistent     PMH: Past Medical History:  Diagnosis Date  . Arthritis   . Breast cancer [facility]) 1982   Right breast cancer - chemotherapy  . Diabetes mellitus without complication ([place])   . Sleep apnea     Surgical History: Past Surgical History:  Procedure Laterality Date  . AUGMENTATION MAMMAPLASTY Bilateral 1982/redo in 2014  . CORONARY ANGIOPLASTY    . MASTECTOMY Right 1982    Home Medications:    Medication List       Accurate as of 04/03/16  4:22 PM. Always use your most recent med list.          citalopram 40 MG tablet Commonly known as:  CELEXA TAKE ONE TABLET BY MOUTH EVERY DAY   colchicine 0.6 MG tablet Take 1 tablet (0.6 mg total) by mouth daily.   furosemide 40 MG tablet Commonly known as:  LASIX   gabapentin 300 MG capsule Commonly known as:  NEURONTIN   HUMALOG KWIKPEN 100 UNIT/ML KiwkPen Generic drug:  insulin lispro   LANTUS SOLOSTAR 100 UNIT/ML Solostar Pen Generic drug:  Insulin Glargine   levothyroxine 125 MCG tablet Commonly known as:  SYNTHROID, LEVOTHROID   LORazepam 1 MG tablet Commonly known as:  ATIVAN     metFORMIN 500 MG 24 hr tablet Commonly known as:  GLUCOPHAGE-XR Take by mouth.   metolazone 2.5 MG tablet Commonly known as:  ZAROXOLYN Take by mouth.   nitroGLYCERIN 0.4 MG SL tablet Commonly known as:  NITROSTAT Place under the tongue.   ropinirole 5 MG tablet Commonly known as:  REQUIP       Allergies: No Known Allergies  Family History: Family History  Problem Relation Age of Onset  . Prostate cancer Neg Hx   . Kidney cancer Neg Hx     Social History:  reports that she has never smoked. She has never used smokeless tobacco. She reports that she does not drink alcohol or use drugs.  ROS: UROLOGY Frequent Urination?: Yes Hard to postpone urination?: Yes Burning/pain with urination?: No Get up at night to urinate?: No Leakage of urine?: No Urine stream starts and stops?: No Trouble starting stream?: No Do you have to strain to urinate?: No Blood in urine?: No Urinary tract infection?: No Sexually transmitted disease?: No Injury to kidneys or bladder?: No Painful intercourse?: No Weak stream?: No Currently pregnant?: No Vaginal bleeding?: No Last menstrual period?: n  Gastrointestinal Nausea?: No Vomiting?: No Indigestion/heartburn?: No Diarrhea?: No Constipation?: No  Constitutional Fever: No Night sweats?: No Weight loss?: No Fatigue?: No  Skin Skin rash/lesions?: No Itching?: No  Eyes Blurred vision?: No Double vision?: No  Ears/Nose/Throat Sore throat?: No Sinus problems?: No  Hematologic/Lymphatic  Swollen glands?: No Easy bruising?: No  Cardiovascular Leg swelling?: Yes Chest pain?: No  Respiratory Cough?: No Shortness of breath?: Yes  Endocrine Excessive thirst?: Yes  Musculoskeletal Back pain?: No Joint pain?: No  Neurological Headaches?: No Dizziness?: No  Psychologic Depression?: No Anxiety?: No  Physical Exam: LMP  (LMP Unknown)   Constitutional:  Alert and oriented, No acute distress. HEENT: Warm [place] AT,  moist mucus membranes.  Trachea midline, no masses. Cardiovascular: No clubbing, cyanosis, or edema. Respiratory: Normal respiratory effort, no increased work of breathing. GI: Abdomen is soft, nontender, nondistended, no abdominal masses GU: No CVA tenderness. Well supported bladder neck and no stress incontinence Skin: No rashes, bruises or suspicious lesions. Lymph: No cervical or inguinal adenopathy. Neurologic: Grossly intact, no focal deficits, moving all 4 extremities. Psychiatric: Normal mood and affect.  Laboratory Data: Lab Results  Component Value Date   WBC 12.3 (H) 02/23/2014   HGB 13.1 02/23/2014   HCT 40.6 02/23/2014   MCV 83 02/23/2014   PLT 335 02/23/2014    Lab Results  Component Value Date   CREATININE 0.75 02/23/2014    No results found for: PSA  No results found for: TESTOSTERONE  No results found for: HGBA1C  Urinalysis    Component Value Date/Time   COLORURINE Straw 05/11/2012 1400   APPEARANCEUR Clear 05/11/2012 1400   LABSPEC 1.008 05/11/2012 1400   PHURINE 5.0 05/11/2012 1400   GLUCOSEU Negative 05/11/2012 1400   HGBUR Negative 05/11/2012 1400   BILIRUBINUR Negative 05/11/2012 1400   KETONESUR Negative 05/11/2012 1400   PROTEINUR Negative 05/11/2012 1400   NITRITE Negative 05/11/2012 1400   LEUKOCYTESUR Trace 05/11/2012 1400    Pertinent Imaging: None  Assessment & Plan:  The patient has mixed incontinence but primarily an overactive bladder. She has minimal to no stress incontinence. Role of medication and physical therapy discussed  The patient shows physical therapy. She'll make a follow-up appointment she wants to try medication  Urgency incontinence Increased frequency  There are no diagnoses linked to this encounter.  No Follow-up on file.  Reece Packer, MD  [facility] [address], [place], [place] [address] [phone]

## 2016-04-03 NOTE — Addendum Note (Signed)
Addended by: Wilson Singer on: 04/03/2016 05:03 PM   Modules accepted: Orders

## 2016-04-21 ENCOUNTER — Inpatient Hospital Stay
Admission: EM | Admit: 2016-04-21 | Discharge: 2016-04-24 | DRG: 247 | Disposition: A | Discharge status: Home / Self Care | Payer: Medicare Other | Attending: Internal Medicine | Admitting: Internal Medicine

## 2016-04-21 ENCOUNTER — Encounter: Payer: Self-pay | Admitting: Emergency Medicine

## 2016-04-21 DIAGNOSIS — R778 Other specified abnormalities of plasma proteins: Secondary | ICD-10-CM

## 2016-04-21 DIAGNOSIS — Z79899 Other long term (current) drug therapy: Secondary | ICD-10-CM

## 2016-04-21 DIAGNOSIS — E039 Hypothyroidism, unspecified: Secondary | ICD-10-CM | POA: Diagnosis present

## 2016-04-21 DIAGNOSIS — I214 Non-ST elevation (NSTEMI) myocardial infarction: Secondary | ICD-10-CM | POA: Diagnosis not present

## 2016-04-21 DIAGNOSIS — Z794 Long term (current) use of insulin: Secondary | ICD-10-CM

## 2016-04-21 DIAGNOSIS — I119 Hypertensive heart disease without heart failure: Secondary | ICD-10-CM | POA: Diagnosis present

## 2016-04-21 DIAGNOSIS — R7889 Finding of other specified substances, not normally found in blood: Secondary | ICD-10-CM | POA: Diagnosis present

## 2016-04-21 DIAGNOSIS — Z7982 Long term (current) use of aspirin: Secondary | ICD-10-CM

## 2016-04-21 DIAGNOSIS — E119 Type 2 diabetes mellitus without complications: Secondary | ICD-10-CM | POA: Diagnosis present

## 2016-04-21 DIAGNOSIS — Z85828 Personal history of other malignant neoplasm of skin: Secondary | ICD-10-CM

## 2016-04-21 DIAGNOSIS — R7989 Other specified abnormal findings of blood chemistry: Secondary | ICD-10-CM

## 2016-04-21 DIAGNOSIS — Z853 Personal history of malignant neoplasm of breast: Secondary | ICD-10-CM

## 2016-04-21 DIAGNOSIS — R001 Bradycardia, unspecified: Secondary | ICD-10-CM | POA: Diagnosis present

## 2016-04-21 DIAGNOSIS — I959 Hypotension, unspecified: Secondary | ICD-10-CM | POA: Diagnosis present

## 2016-04-21 DIAGNOSIS — R42 Dizziness and giddiness: Secondary | ICD-10-CM | POA: Diagnosis not present

## 2016-04-21 DIAGNOSIS — R531 Weakness: Secondary | ICD-10-CM

## 2016-04-21 DIAGNOSIS — R0989 Other specified symptoms and signs involving the circulatory and respiratory systems: Secondary | ICD-10-CM

## 2016-04-21 DIAGNOSIS — I2510 Atherosclerotic heart disease of native coronary artery without angina pectoris: Secondary | ICD-10-CM | POA: Diagnosis present

## 2016-04-21 DIAGNOSIS — I209 Angina pectoris, unspecified: Secondary | ICD-10-CM | POA: Insufficient documentation

## 2016-04-21 DIAGNOSIS — E118 Type 2 diabetes mellitus with unspecified complications: Secondary | ICD-10-CM | POA: Diagnosis present

## 2016-04-21 DIAGNOSIS — R079 Chest pain, unspecified: Secondary | ICD-10-CM

## 2016-04-21 DIAGNOSIS — Z7902 Long term (current) use of antithrombotics/antiplatelets: Secondary | ICD-10-CM

## 2016-04-21 DIAGNOSIS — E782 Mixed hyperlipidemia: Secondary | ICD-10-CM | POA: Diagnosis present

## 2016-04-21 LAB — BASIC METABOLIC PANEL
ANION GAP: 8 (ref 5–15)
BUN: 14 mg/dL (ref 6–20)
CHLORIDE: 105 mmol/L (ref 101–111)
CO2: 25 mmol/L (ref 22–32)
Calcium: 9 mg/dL (ref 8.9–10.3)
Creatinine, Ser: 0.86 mg/dL (ref 0.44–1.00)
GFR calc Af Amer: >60 mL/min (ref >60)
GLUCOSE: 370 mg/dL — AB (ref 65–99)
POTASSIUM: 4.4 mmol/L (ref 3.5–5.1)
Sodium: 138 mmol/L (ref 135–145)

## 2016-04-21 LAB — TROPONIN I
Troponin I: 0.43 ng/mL (ref <0.03)
Troponin I: 0.51 ng/mL (ref <0.03)

## 2016-04-21 LAB — CBC
HEMATOCRIT: 38.9 % (ref 35.0–47.0)
HEMOGLOBIN: 12.6 g/dL (ref 12.0–16.0)
MCH: 27.8 pg (ref 26.0–34.0)
MCHC: 32.5 g/dL (ref 32.0–36.0)
MCV: 85.7 fL (ref 80.0–100.0)
Platelets: 249 10*3/uL (ref 150–440)
RBC: 4.54 MIL/uL (ref 3.80–5.20)
RDW: 15.5 % — ABNORMAL HIGH (ref 11.5–14.5)
WBC: 10.9 10*3/uL (ref 3.6–11.0)

## 2016-04-21 LAB — GLUCOSE, CAPILLARY: GLUCOSE-CAPILLARY: 163 mg/dL — AB (ref 65–99)

## 2016-04-21 MED ORDER — ASPIRIN EC 81 MG PO TBEC
81.0000 mg | DELAYED_RELEASE_TABLET | Freq: Every day | ORAL | Status: DC
  Administered 2016-04-22 – 2016-04-24 (×4): 81 mg via ORAL
  Filled 2016-04-21 (×3): qty 1

## 2016-04-21 MED ORDER — BISACODYL 10 MG RE SUPP: 10.0000 mg | Freq: Every day | RECTAL | Status: DC | PRN

## 2016-04-21 MED ORDER — INSULIN ASPART 100 UNIT/ML ~~LOC~~ SOLN: 0.0000 [IU] | Freq: Three times a day (TID) | SUBCUTANEOUS | Status: DC

## 2016-04-21 MED ORDER — SODIUM CHLORIDE 0.9 % IV SOLN
INTRAVENOUS | Status: DC
Start: 2016-04-21 — End: 2016-04-24
  Administered 2016-04-21 – 2016-04-24 (×7): via INTRAVENOUS

## 2016-04-21 MED ORDER — ONDANSETRON HCL 4 MG PO TABS: 4.0000 mg | ORAL_TABLET | Freq: Four times a day (QID) | ORAL | Status: DC | PRN

## 2016-04-21 MED ORDER — DOCUSATE SODIUM 100 MG PO CAPS
100.0000 mg | ORAL_CAPSULE | Freq: Two times a day (BID) | ORAL | Status: DC
  Administered 2016-04-21 – 2016-04-24 (×3): 100 mg via ORAL
  Filled 2016-04-21 (×5): qty 1

## 2016-04-21 MED ORDER — NITROGLYCERIN 2 % TD OINT
1.0000 [in_us] | TOPICAL_OINTMENT | Freq: Four times a day (QID) | TRANSDERMAL | Status: DC
  Administered 2016-04-22 – 2016-04-23 (×2): 1 [in_us] via TOPICAL
  Filled 2016-04-21 (×2): qty 1

## 2016-04-21 MED ORDER — NITROGLYCERIN 0.4 MG SL SUBL: 0.4000 mg | SUBLINGUAL_TABLET | SUBLINGUAL | Status: DC | PRN

## 2016-04-21 MED ORDER — MORPHINE SULFATE (PF) 2 MG/ML IV SOLN: 2.0000 mg | INTRAVENOUS | Status: DC | PRN

## 2016-04-21 MED ORDER — ACETAMINOPHEN 650 MG RE SUPP: 650.0000 mg | Freq: Four times a day (QID) | RECTAL | Status: DC | PRN

## 2016-04-21 MED ORDER — HEPARIN SODIUM (PORCINE) 5000 UNIT/ML IJ SOLN
5000.0000 [IU] | Freq: Three times a day (TID) | INTRAMUSCULAR | Status: DC
  Administered 2016-04-21 – 2016-04-24 (×7): 5000 [IU] via SUBCUTANEOUS
  Filled 2016-04-21 (×7): qty 1

## 2016-04-21 MED ORDER — COLCHICINE 0.6 MG PO TABS
0.6000 mg | ORAL_TABLET | Freq: Every day | ORAL | Status: DC
Start: 2016-04-22 — End: 2016-04-24
  Administered 2016-04-22 – 2016-04-24 (×4): 0.6 mg via ORAL
  Filled 2016-04-21 (×3): qty 1

## 2016-04-21 MED ORDER — CITALOPRAM HYDROBROMIDE 20 MG PO TABS
40.0000 mg | ORAL_TABLET | Freq: Every day | ORAL | Status: DC
  Administered 2016-04-21 – 2016-04-23 (×2): 40 mg via ORAL
  Filled 2016-04-21 (×3): qty 2

## 2016-04-21 MED ORDER — ACETAMINOPHEN 325 MG PO TABS: 650.0000 mg | ORAL_TABLET | Freq: Four times a day (QID) | ORAL | Status: DC | PRN

## 2016-04-21 MED ORDER — LORAZEPAM 2 MG/ML IJ SOLN: 0.5000 mg | INTRAMUSCULAR | Status: DC | PRN

## 2016-04-21 MED ORDER — SODIUM CHLORIDE 0.9% FLUSH
3.0000 mL | Freq: Two times a day (BID) | INTRAVENOUS | Status: DC
  Administered 2016-04-21 – 2016-04-23 (×2): 3 mL via INTRAVENOUS

## 2016-04-21 MED ORDER — INSULIN GLARGINE 100 UNIT/ML ~~LOC~~ SOLN
42.0000 [IU] | Freq: Every day | SUBCUTANEOUS | Status: DC
  Administered 2016-04-21: 42 [IU] via SUBCUTANEOUS
  Filled 2016-04-21 (×2): qty 0.42

## 2016-04-21 MED ORDER — LEVOTHYROXINE SODIUM 137 MCG PO TABS
137.0000 ug | ORAL_TABLET | ORAL | Status: DC
  Administered 2016-04-22 – 2016-04-24 (×4): 137 ug via ORAL
  Filled 2016-04-21 (×4): qty 1

## 2016-04-21 MED ORDER — ASPIRIN EC 325 MG PO TBEC
325.0000 mg | DELAYED_RELEASE_TABLET | Freq: Once | ORAL | Status: AC
  Administered 2016-04-21: 325 mg via ORAL
  Filled 2016-04-21: qty 1

## 2016-04-21 MED ORDER — ROPINIROLE HCL 1 MG PO TABS
5.0000 mg | ORAL_TABLET | Freq: Every day | ORAL | Status: DC
  Administered 2016-04-21 – 2016-04-23 (×3): 5 mg via ORAL
  Filled 2016-04-21 (×3): qty 5

## 2016-04-21 MED ORDER — MECLIZINE HCL 25 MG PO TABS: 25.0000 mg | ORAL_TABLET | Freq: Three times a day (TID) | ORAL | Status: DC | PRN

## 2016-04-21 MED ORDER — ROSUVASTATIN CALCIUM 10 MG PO TABS
10.0000 mg | ORAL_TABLET | Freq: Every day | ORAL | Status: DC
  Administered 2016-04-21 – 2016-04-23 (×3): 10 mg via ORAL
  Filled 2016-04-21 (×3): qty 1

## 2016-04-21 MED ORDER — ONDANSETRON HCL 4 MG/2ML IJ SOLN: 4.0000 mg | Freq: Four times a day (QID) | INTRAMUSCULAR | Status: DC | PRN

## 2016-04-21 MED ORDER — HYDRALAZINE HCL 20 MG/ML IJ SOLN: 10.0000 mg | INTRAMUSCULAR | Status: DC | PRN

## 2016-04-21 MED ORDER — SODIUM CHLORIDE 0.9 % IV BOLUS (SEPSIS)
1000.0000 mL | Freq: Once | INTRAVENOUS | Status: AC
  Administered 2016-04-21: 1000 mL via INTRAVENOUS

## 2016-04-21 NOTE — H&P (Signed)
History and Physical    Cassandra [name] [id number] DOB: 05/12/1933 DOA: 04/21/2016  Referring physician: Dr. Archie Balboa PCP: Idelle Crouch, MD  Specialists: Dr. Ubaldo Glassing  Chief Complaint: CP and dizziness  HPI: Cassandra [name] is a 82 y.o. female has a past medical history significant for CAD, HTN, and DM now with recurrent episodes of CP, SOB, and dizziness. In ER, BP very labile with diastolics from 50 to [id number]. Bradycardic. Troponin elevated. Sugar elevated. She is now admitted. Currently pain free with no SOB or dizziness.  Review of Systems: The patient denies anorexia, fever, weight loss,, vision loss, decreased hearing, hoarseness, syncope, dyspnea on exertion, peripheral edema, balance deficits, hemoptysis, abdominal pain, melena, hematochezia, severe indigestion/heartburn, hematuria, incontinence, genital sores, muscle weakness, suspicious skin lesions, transient blindness, difficulty walking, depression, unusual weight change, abnormal bleeding, enlarged lymph nodes, angioedema, and breast masses.   Past Medical History:  Diagnosis Date  . Acquired hypothyroidism 01/06/2014  . Arthritis   . B12 deficiency 02/15/2014  . Benign essential hypertension 01/06/2014  . Breast cancer [facility]) 1982   Right breast cancer - chemotherapy  . CAD (coronary artery disease), native coronary artery 01/06/2014  . Carotid artery calcification   . Chronic airway obstruction, not elsewhere classified 01/06/2014  . Diabetes mellitus without complication ([place])   . Pure hypercholesterolemia 01/06/2014  . Skin cancer   . Sleep apnea    Past Surgical History:  Procedure Laterality Date  . AUGMENTATION MAMMAPLASTY Bilateral 1982/redo in 2014  . CAROTID ARTERY ANGIOPLASTY    . MASTECTOMY Right 1982  . SKIN CANCER EXCISION     Social History:  reports that she has never smoked. She has never used smokeless tobacco. She reports that she does not drink alcohol or use drugs.  No Known Allergies  Family  History  Problem Relation Age of Onset  . Prostate cancer Neg Hx   . Kidney cancer Neg Hx     Prior to Admission medications   Medication Sig Start Date End Date Taking? Authorizing Provider  citalopram (CELEXA) 40 MG tablet TAKE ONE TABLET BY MOUTH EVERY NIGHT AT BEDTIME. 07/07/14  Yes Historical Provider, MD  HUMALOG KWIKPEN 100 UNIT/ML KiwkPen Inject 10-14 Units into the skin 3 (three) times daily before meals. *Use as directed per sliding scale* 03/03/15  Yes Historical Provider, MD  LANTUS SOLOSTAR 100 UNIT/ML Solostar Pen Inject 42 Units into the skin at bedtime.  05/19/15  Yes Historical Provider, MD  levothyroxine (SYNTHROID, LEVOTHROID) 137 MCG tablet Take 137 mcg by mouth every morning. 04/16/16  Yes Historical Provider, MD  meclizine (ANTIVERT) 25 MG tablet Take 25 mg by mouth every other day.  02/26/16  Yes Historical Provider, MD  metFORMIN (GLUCOPHAGE-XR) 500 MG 24 hr tablet Take 1,000 mg by mouth 2 (two) times daily. 02/13/16  Yes Historical Provider, MD  metoprolol tartrate (LOPRESSOR) 25 MG tablet Take 25 mg by mouth 2 (two) times daily. 04/15/16  Yes Historical Provider, MD  ropinirole (REQUIP) 5 MG tablet Take 5 mg by mouth at bedtime.  03/29/15  Yes Historical Provider, MD  rosuvastatin (CRESTOR) 10 MG tablet Take 10 mg by mouth at bedtime.  03/25/16  Yes Historical Provider, MD  colchicine 0.6 MG tablet Take 1 tablet (0.6 mg total) by mouth daily. 05/22/15   Max Villa Herb, DPM   Physical Exam: Vitals:   04/21/16 1434 04/21/16 1435 04/21/16 1631 04/21/16 1700  BP: 124/83  120/61 (!) 141/68  Pulse: (!) 50  (!) 48 (!) 45  Resp:   11 13  Temp: 98 F (36.7 C)     SpO2: 96%  94% 95%  Weight:  88.5 kg (195 lb)    Height:  5\' 2"  (1.575 m)       General:  No apparent distress, WDWN, [place]/AT  Eyes: PERRL, EOMI, no scleral icterus, conjunctiva clear  ENT: moist oropharynx without exudate, TM's benign, dentition good  Neck: supple, no lymphadenopathy. No bruits or  thyromegaly  Cardiovascular: slow rate with regular rhythm without MRG; 2+ peripheral pulses, no JVD, no peripheral edema  Respiratory: CTA biL, good air movement without wheezing, rhonchi or crackled. Respiratory effort normal  Abdomen: soft, non tender to palpation, positive bowel sounds, no guarding, no rebound  Skin: no rashes or lesions  Musculoskeletal: normal bulk and tone, no joint swelling  Psychiatric: normal mood and affect, A&OX3  Neurologic: CN 2-12 grossly intact, Motor strength  5/5 in all 4 groups with symmetric DTR's and non-focal sensory exam  Labs on Admission:  Basic Metabolic Panel:  Recent Labs Lab 04/21/16 1458  NA 138  K 4.4  CL 105  CO2 25  GLUCOSE 370*  BUN 14  CREATININE 0.86  CALCIUM 9.0   Liver Function Tests: No results for input(s): AST, ALT, ALKPHOS, BILITOT, PROT, ALBUMIN in the last 168 hours. No results for input(s): LIPASE, AMYLASE in the last 168 hours. No results for input(s): AMMONIA in the last 168 hours. CBC:  Recent Labs Lab 04/21/16 1458  WBC 10.9  HGB 12.6  HCT 38.9  MCV 85.7  PLT 249   Cardiac Enzymes:  Recent Labs Lab 04/21/16 1458  TROPONINI 0.43*    BNP (last 3 results) No results for input(s): BNP in the last 8760 hours.  ProBNP (last 3 results) No results for input(s): PROBNP in the last 8760 hours.  CBG: No results for input(s): GLUCAP in the last 168 hours.  Radiological Exams on Admission: No results found.  EKG: Independently reviewed.  Assessment/Plan Principal Problem:   Chest pain Active Problems:   Type II diabetes mellitus with manifestations (HCC)   Elevated troponin   Labile hypertension   Will admit to telemetry and hold beta blocker. Follow sugars. Monitor BP closely. Follow enzymes, order echo and consult Cardiology. Begin IV NS. F/u labs in AM.  Diet: carb modified Fluids: NS@75  DVT Prophylaxis: SQ Heparin  Code Status: FULL  Family Communication: yes  Disposition  Plan: home  Time spent: 50 min

## 2016-04-21 NOTE — ED Provider Notes (Signed)
[facility] Emergency Department Provider Note  ____________________________________________   I have reviewed the triage vital signs and the nursing notes.   HISTORY  Chief Complaint Dizziness   History limited by: Not Limited   HPI Cassandra [name] is a 82 y.o. female who presents to the emergency department today because of concerns for low blood pressure and dizziness. The patient states that her blood pressure has been doing well on and off for the past week or so. Morning when she woke up she felt like it was again low. She felt dizzy when she would stand up. She did have a brief sensation of chest pain. This sensation lasted throughout the day. The patient denies any fevers. She did say she had a very brief episode of chest discomfort this morning.    Past Medical History:  Diagnosis Date  . Acquired hypothyroidism 01/06/2014  . Arthritis   . B12 deficiency 02/15/2014  . Benign essential hypertension 01/06/2014  . Breast cancer [facility]) 1982   Right breast cancer - chemotherapy  . CAD (coronary artery disease), native coronary artery 01/06/2014  . Carotid artery calcification   . Chronic airway obstruction, not elsewhere classified 01/06/2014  . Diabetes mellitus without complication ([place])   . Pure hypercholesterolemia 01/06/2014  . Skin cancer   . Sleep apnea     Patient Active Problem List   Diagnosis Date Noted  . Long-term insulin use ([place]) 05/24/2014  . Mild vitamin D deficiency 05/20/2014  . B12 deficiency 02/15/2014  . Acquired hypothyroidism 01/06/2014  . Chronic airway obstruction, not elsewhere classified 01/06/2014  . CAD (coronary artery disease), native coronary artery 01/06/2014  . Benign essential hypertension 01/06/2014  . Headache 01/06/2014  . Hypersomnia with sleep apnea 01/06/2014  . Pure hypercholesterolemia 01/06/2014  . Type II diabetes mellitus with manifestations ([place]) 01/06/2014    Past Surgical History:  Procedure  Laterality Date  . AUGMENTATION MAMMAPLASTY Bilateral 1982/redo in 2014  . CAROTID ARTERY ANGIOPLASTY    . MASTECTOMY Right 1982  . SKIN CANCER EXCISION      Prior to Admission medications   Medication Sig Start Date End Date Taking? Authorizing Provider  citalopram (CELEXA) 40 MG tablet TAKE ONE TABLET BY MOUTH EVERY DAY 07/07/14   Historical Provider, MD  colchicine 0.6 MG tablet Take 1 tablet (0.6 mg total) by mouth daily. 05/22/15   Max T Hyatt, DPM  furosemide (LASIX) 40 MG tablet  04/13/15   Historical Provider, MD  HUMALOG KWIKPEN 100 UNIT/ML KiwkPen  03/03/15   Historical Provider, MD  LANTUS SOLOSTAR 100 UNIT/ML Solostar Pen  05/19/15   Historical Provider, MD  levothyroxine (SYNTHROID, LEVOTHROID) 125 MCG tablet  05/11/15   Historical Provider, MD  meclizine (ANTIVERT) 25 MG tablet  02/26/16   Historical Provider, MD  metFORMIN (GLUCOPHAGE-XR) 500 MG 24 hr tablet Take by mouth. 12/28/14   Historical Provider, MD  ropinirole (REQUIP) 5 MG tablet  03/29/15   Historical Provider, MD  rosuvastatin (CRESTOR) 10 MG tablet  03/25/16   Historical Provider, MD    Allergies Review of patient's allergies indicates no known allergies.  Family History  Problem Relation Age of Onset  . Prostate cancer Neg Hx   . Kidney cancer Neg Hx     Social History Social History  Substance Use Topics  . Smoking status: Never Smoker  . Smokeless tobacco: Never Used  . Alcohol use No    Review of Systems  Constitutional: Negative for fever. Cardiovascular: Negative for chest pain.  Respiratory: Negative for shortness of breath. Gastrointestinal: Negative for abdominal pain, vomiting and diarrhea. Neurological: Negative for headaches, focal weakness or numbness.  10-point ROS otherwise negative.  ____________________________________________   PHYSICAL EXAM:  VITAL SIGNS: ED Triage Vitals  Enc Vitals Group     BP 04/21/16 1434 124/83     Pulse Rate 04/21/16 1434 (!) 50     Resp --      Temp  04/21/16 1434 98 F (36.7 C)     Temp src --      SpO2 04/21/16 1434 96 %     Weight 04/21/16 1435 195 lb (88.5 kg)     Height 04/21/16 1435 5\' 2"  (1.575 m)     Head Circumference --      Peak Flow --      Pain Score 04/21/16 1435 0   Constitutional: Alert and oriented. Well appearing and in no distress. Eyes: Conjunctivae are normal. PERRL. Normal extraocular movements. ENT   Head: Normocephalic and atraumatic.   Nose: No congestion/rhinnorhea.   Mouth/Throat: Mucous membranes are moist.   Neck: No stridor. Hematological/Lymphatic/Immunilogical: No cervical lymphadenopathy. Cardiovascular: Normal rate, regular rhythm.  No murmurs, rubs, or gallops. Respiratory: Normal respiratory effort without tachypnea nor retractions. Breath sounds are clear and equal bilaterally. No wheezes/rales/rhonchi. Gastrointestinal: Soft and nontender. No distention.  Genitourinary: Deferred Musculoskeletal: Normal range of motion in all extremities. No joint effusions.  No lower extremity tenderness nor edema. Neurologic:  Normal speech and language. No gross focal neurologic deficits are appreciated.  Skin:  Skin is warm, dry and intact. No rash noted. Psychiatric: Mood and affect are normal. Speech and behavior are normal. Patient exhibits appropriate insight and judgment.  ____________________________________________    LABS (pertinent positives/negatives)  Labs Reviewed  BASIC METABOLIC PANEL - Abnormal; Notable for the following:       Result Value   Glucose, Bld 370 (*)    All other components within normal limits  CBC - Abnormal; Notable for the following:    RDW 15.5 (*)    All other components within normal limits  TROPONIN I - Abnormal; Notable for the following:    Troponin I 0.43 (*)    All other components within normal limits  URINALYSIS COMPLETEWITH MICROSCOPIC ([facility] ONLY)     ____________________________________________   EKG  I, Nance Pear, attending  physician, personally viewed and interpreted this EKG  EKG Time: 1457 Rate: 50 Rhythm: sinus bradycardia Axis: left axis deviation Intervals: qtc 404 QRS: narrow, q waves V1 ST changes: no st elevation Impression: abnormal ekg   ____________________________________________    RADIOLOGY  None  ____________________________________________   PROCEDURES  Procedures  ____________________________________________   INITIAL IMPRESSION / ASSESSMENT AND PLAN / ED COURSE  Pertinent labs & imaging results that were available during my care of the patient were reviewed by me and considered in my medical decision making (see chart for details).  Patient presented to the emergency department today because of concerns for low blood pressure and weakness. Also had a complaint of a brief episode of chest discomfort. On exam patient appears well. In no acute distress. Will add on troponin to previous blood work orders.  Clinical Course   Troponin elevated to 0.43. Will plan on admission to the hospitalist service.  ____________________________________________   FINAL CLINICAL IMPRESSION(S) / ED DIAGNOSES  Final diagnoses:  Elevated troponin  Weakness     Note: This dictation was prepared with Dragon dictation. Any transcriptional errors that result from this process are unintentional  Nance Pear, MD 04/21/16 [phone]

## 2016-04-21 NOTE — ED Notes (Signed)
Pt transported to room 260AA

## 2016-04-21 NOTE — ED Notes (Signed)
Family at bedside. 

## 2016-04-21 NOTE — Care Management Obs Status (Signed)
[place] NOTIFICATION   Patient Details  Name: BETSEY GERSTLE MRN: [id number] Date of Birth: 26-Aug-1933   Medicare Observation Status Notification Given:   Yes    Beau Fanny, RN 04/21/2016, 8:32 PM

## 2016-04-21 NOTE — ED Triage Notes (Signed)
States took her bp 3 x this am and when it finally took was 130/30. vss here. States feels like can't get a good breath and dizzy. States chest pain then states not a pain

## 2016-04-22 ENCOUNTER — Observation Stay
Admit: 2016-04-22 | Discharge: 2016-04-22 | Disposition: A | Discharge status: Home / Self Care | Payer: Medicare Other | Attending: Internal Medicine | Admitting: Internal Medicine

## 2016-04-22 DIAGNOSIS — Z794 Long term (current) use of insulin: Secondary | ICD-10-CM | POA: Diagnosis not present

## 2016-04-22 DIAGNOSIS — Z7982 Long term (current) use of aspirin: Secondary | ICD-10-CM | POA: Diagnosis not present

## 2016-04-22 DIAGNOSIS — Z853 Personal history of malignant neoplasm of breast: Secondary | ICD-10-CM | POA: Diagnosis not present

## 2016-04-22 DIAGNOSIS — E039 Hypothyroidism, unspecified: Secondary | ICD-10-CM | POA: Diagnosis present

## 2016-04-22 DIAGNOSIS — E118 Type 2 diabetes mellitus with unspecified complications: Secondary | ICD-10-CM | POA: Diagnosis present

## 2016-04-22 DIAGNOSIS — R001 Bradycardia, unspecified: Secondary | ICD-10-CM | POA: Diagnosis present

## 2016-04-22 DIAGNOSIS — I2510 Atherosclerotic heart disease of native coronary artery without angina pectoris: Secondary | ICD-10-CM | POA: Diagnosis present

## 2016-04-22 DIAGNOSIS — I214 Non-ST elevation (NSTEMI) myocardial infarction: Secondary | ICD-10-CM | POA: Diagnosis present

## 2016-04-22 DIAGNOSIS — I119 Hypertensive heart disease without heart failure: Secondary | ICD-10-CM | POA: Diagnosis present

## 2016-04-22 DIAGNOSIS — I959 Hypotension, unspecified: Secondary | ICD-10-CM | POA: Diagnosis present

## 2016-04-22 DIAGNOSIS — Z85828 Personal history of other malignant neoplasm of skin: Secondary | ICD-10-CM | POA: Diagnosis not present

## 2016-04-22 DIAGNOSIS — Z79899 Other long term (current) drug therapy: Secondary | ICD-10-CM | POA: Diagnosis not present

## 2016-04-22 DIAGNOSIS — Z7902 Long term (current) use of antithrombotics/antiplatelets: Secondary | ICD-10-CM | POA: Diagnosis not present

## 2016-04-22 DIAGNOSIS — R42 Dizziness and giddiness: Secondary | ICD-10-CM | POA: Diagnosis present

## 2016-04-22 DIAGNOSIS — R7889 Finding of other specified substances, not normally found in blood: Secondary | ICD-10-CM | POA: Diagnosis present

## 2016-04-22 DIAGNOSIS — E782 Mixed hyperlipidemia: Secondary | ICD-10-CM | POA: Diagnosis present

## 2016-04-22 LAB — COMPREHENSIVE METABOLIC PANEL
ALBUMIN: 2.9 g/dL — AB (ref 3.5–5.0)
ALK PHOS: 43 U/L (ref 38–126)
ALT: 11 U/L — ABNORMAL LOW (ref 14–54)
ANION GAP: 4 — AB (ref 5–15)
AST: 17 U/L (ref 15–41)
BUN: 12 mg/dL (ref 6–20)
CALCIUM: 8.3 mg/dL — AB (ref 8.9–10.3)
CO2: 25 mmol/L (ref 22–32)
Chloride: 111 mmol/L (ref 101–111)
Creatinine, Ser: 0.66 mg/dL (ref 0.44–1.00)
GFR calc Af Amer: >60 mL/min (ref >60)
GFR calc non Af Amer: >60 mL/min (ref >60)
GLUCOSE: 96 mg/dL (ref 65–99)
Potassium: 3.7 mmol/L (ref 3.5–5.1)
SODIUM: 140 mmol/L (ref 135–145)
Total Bilirubin: 0.2 mg/dL — ABNORMAL LOW (ref 0.3–1.2)
Total Protein: 6 g/dL — ABNORMAL LOW (ref 6.5–8.1)

## 2016-04-22 LAB — GLUCOSE, CAPILLARY
GLUCOSE-CAPILLARY: 76 mg/dL (ref 65–99)
Glucose-Capillary: 67 mg/dL (ref 65–99)
Glucose-Capillary: 127 mg/dL — ABNORMAL HIGH (ref 65–99)
Glucose-Capillary: 179 mg/dL — ABNORMAL HIGH (ref 65–99)
Glucose-Capillary: 156 mg/dL — ABNORMAL HIGH (ref 65–99)

## 2016-04-22 LAB — ECHOCARDIOGRAM COMPLETE
Height: 62 in
Weight: 3136 oz

## 2016-04-22 LAB — CBC
HCT: 35.8 % (ref 35.0–47.0)
HEMOGLOBIN: 12 g/dL (ref 12.0–16.0)
MCH: 28.1 pg (ref 26.0–34.0)
MCHC: 33.5 g/dL (ref 32.0–36.0)
MCV: 84 fL (ref 80.0–100.0)
Platelets: 225 10*3/uL (ref 150–440)
RBC: 4.26 MIL/uL (ref 3.80–5.20)
RDW: 15.4 % — ABNORMAL HIGH (ref 11.5–14.5)
WBC: 9.9 10*3/uL (ref 3.6–11.0)

## 2016-04-22 LAB — TROPONIN I
TROPONIN I: 0.52 ng/mL — AB (ref <0.03)
Troponin I: 0.44 ng/mL (ref <0.03)

## 2016-04-22 LAB — TSH: TSH: 1.717 u[IU]/mL (ref 0.350–4.500)

## 2016-04-22 MED ORDER — INSULIN GLARGINE 100 UNIT/ML ~~LOC~~ SOLN: 35.0000 [IU] | Freq: Every day | SUBCUTANEOUS | 11 refills | Status: DC

## 2016-04-22 MED ORDER — INSULIN GLARGINE 100 UNIT/ML ~~LOC~~ SOLN
35.0000 [IU] | Freq: Every day | SUBCUTANEOUS | Status: DC
  Administered 2016-04-22 – 2016-04-23 (×2): 35 [IU] via SUBCUTANEOUS
  Filled 2016-04-22 (×3): qty 0.35

## 2016-04-22 MED ORDER — ROPINIROLE HCL 1 MG PO TABS: 5.0000 mg | ORAL_TABLET | Freq: Every day | ORAL | Status: DC

## 2016-04-22 MED ORDER — ASPIRIN 81 MG PO TBEC: 81.0000 mg | DELAYED_RELEASE_TABLET | Freq: Every day | ORAL | 0 refills | Status: DC

## 2016-04-22 MED ORDER — SODIUM CHLORIDE 0.9% FLUSH: 3.0000 mL | INTRAVENOUS | Status: DC | PRN

## 2016-04-22 MED ORDER — SODIUM CHLORIDE 0.9 % WEIGHT BASED INFUSION: 3.0000 mL/kg/h | INTRAVENOUS | Status: AC

## 2016-04-22 MED ORDER — ASPIRIN 81 MG PO CHEW
81.0000 mg | CHEWABLE_TABLET | ORAL | Status: AC
Start: 2016-04-23 — End: 2016-04-23
  Administered 2016-04-23: 81 mg via ORAL
  Filled 2016-04-22: qty 1

## 2016-04-22 MED ORDER — SODIUM CHLORIDE 0.9% FLUSH
3.0000 mL | Freq: Two times a day (BID) | INTRAVENOUS | Status: DC
  Administered 2016-04-23: 3 mL via INTRAVENOUS

## 2016-04-22 MED ORDER — SODIUM CHLORIDE 0.9 % WEIGHT BASED INFUSION
1.0000 mL/kg/h | INTRAVENOUS | Status: DC
  Administered 2016-04-23: 1 mL/kg/h via INTRAVENOUS

## 2016-04-22 MED ORDER — INSULIN ASPART 100 UNIT/ML ~~LOC~~ SOLN
0.0000 [IU] | Freq: Three times a day (TID) | SUBCUTANEOUS | Status: DC
  Administered 2016-04-22: 2 [IU] via SUBCUTANEOUS
  Filled 2016-04-22: qty 2

## 2016-04-22 MED ORDER — SODIUM CHLORIDE 0.9 % IV SOLN: 250.0000 mL | INTRAVENOUS | Status: DC | PRN

## 2016-04-22 MED ORDER — INSULIN ASPART 100 UNIT/ML ~~LOC~~ SOLN: 0.0000 [IU] | Freq: Every day | SUBCUTANEOUS | Status: DC

## 2016-04-22 NOTE — Progress Notes (Signed)
Ambulated patient.  HR in normal sinus rhythm between 60-70 while ambulating.  No dizziness, shortness of breath or chest pain experienced.

## 2016-04-22 NOTE — Progress Notes (Signed)
Dr. Nehemiah Massed planning to cath patient tomorrow.  Spoke to Dr. Benjie Karvonen and discharge cancelled.

## 2016-04-22 NOTE — Discharge Summary (Signed)
[facility] [place] NAME: Cassandra [name]    MR#:  [id number]  DATE OF BIRTH:  1933/03/07  DATE OF ADMISSION:  04/21/2016 ADMITTING PHYSICIAN: Idelle Crouch, MD  DATE OF DISCHARGE: 04/22/2016  PRIMARY CARE PHYSICIAN: SPARKS,JEFFREY D, MD    ADMISSION DIAGNOSIS:  dizziness low blood pressure non-ST elevation myocardial infarction  DISCHARGE DIAGNOSIS:  Principal Problem:   NSTEMIActive Problems:   Type II diabetes mellitus with manifestations (HCC)   Elevated troponin   Labile hypertension   SECONDARY DIAGNOSIS:   Past Medical History:  Diagnosis Date  . Acquired hypothyroidism 01/06/2014  . Arthritis   . B12 deficiency 02/15/2014  . Benign essential hypertension 01/06/2014  . Breast cancer [facility]) 1982   Right breast cancer - chemotherapy  . CAD (coronary artery disease), native coronary artery 01/06/2014  . Carotid artery calcification   . Chronic airway obstruction, not elsewhere classified 01/06/2014  . Diabetes mellitus without complication ([place])   . Pure hypercholesterolemia 01/06/2014  . Skin cancer   . Sleep apnea     HOSPITAL COURSE:   82 year old female with a history of diabetes and hypertension who presents with low blood pressure and dizzinessAnd found to have non-ST elevation MI.  1. Non-ST elevation [id number] shows normal LV systolic function with ejection fraction of 55-60% Cardiac catheterization shows normal LV systolic function with occluded right coronary artery with appropriate collateral flow nondominant Additionally the patient has a significant 75% proximal left anterior descending artery stenosis with calcification She underwent PCI of LAD. Medical management for RCA. Continue aspirin, Plavix, statin and isosorbide. Cardiac rehabilitation referral was made.  2. Bradycardia: Currently metoprolol is discontinued due to symptoms of bradycardia.  3. Diabetes: Continue current regimen.   4.  Hypothyroidism: Patient will continue Synthroid. TSH is within normal limits. Synthroid doses recently changed as per patient.  5. Hyperlipidemia: Continue Crestor  DISCHARGE CONDITIONS AND DIET:   Home  Diabetic diet  CONSULTS OBTAINED:  Treatment Team:  Corey Skains, MD  DRUG ALLERGIES:  No Known Allergies  DISCHARGE MEDICATIONS:   Discharge Medication List as of 04/24/2016 10:42 AM    START taking these medications   Details  aspirin EC 81 MG EC tablet Take 1 tablet (81 mg total) by mouth daily., Starting Mon 04/22/2016, Normal    clopidogrel (PLAVIX) 75 MG tablet Take 1 tablet (75 mg total) by mouth daily with breakfast., Starting Wed 04/24/2016, Normal    insulin glargine (LANTUS) 100 UNIT/ML injection Inject 0.35 mLs (35 Units total) into the skin at bedtime., Starting Mon 04/22/2016, Normal    isosorbide mononitrate (IMDUR) 30 MG 24 hr tablet Take 1 tablet (30 mg total) by mouth daily., Starting Wed 04/24/2016, Normal    lisinopril (PRINIVIL,ZESTRIL) 2.5 MG tablet Take 1 tablet (2.5 mg total) by mouth daily., Starting Wed 04/24/2016, Normal    nitroGLYCERIN (NITROSTAT) 0.4 MG SL tablet Place 1 tablet (0.4 mg total) under the tongue every 5 (five) minutes as needed for chest pain., Starting Wed 04/24/2016, Normal      CONTINUE these medications which have CHANGED   Details  metFORMIN (GLUCOPHAGE-XR) 500 MG 24 hr tablet Take 2 tablets (1,000 mg total) by mouth 2 (two) times daily. Please start this on 8/24, Starting Wed 04/24/2016, No Print      CONTINUE these medications which have NOT CHANGED   Details  citalopram (CELEXA) 40 MG tablet TAKE ONE TABLET BY MOUTH EVERY NIGHT AT BEDTIME., Historical Med    colchicine  0.6 MG tablet Take 1 tablet (0.6 mg total) by mouth daily., Starting Mon 05/22/2015, Normal    HUMALOG KWIKPEN 100 UNIT/ML KiwkPen Inject 10-14 Units into the skin 3 (three) times daily before meals. *Use as directed per sliding scale*, Starting Fri 03/03/2015,  Historical Med    levothyroxine (SYNTHROID, LEVOTHROID) 137 MCG tablet Take 137 mcg by mouth every morning., Starting Tue 04/16/2016, Historical Med    meclizine (ANTIVERT) 25 MG tablet Take 25 mg by mouth every other day. , Starting Mon 02/26/2016, Historical Med    ropinirole (REQUIP) 5 MG tablet Take 5 mg by mouth at bedtime. , Starting Wed 03/29/2015, Historical Med    rosuvastatin (CRESTOR) 10 MG tablet Take 10 mg by mouth at bedtime. , Starting Mon 03/25/2016, Historical Med      STOP taking these medications     LANTUS SOLOSTAR 100 UNIT/ML Solostar Pen      metoprolol tartrate (LOPRESSOR) 25 MG tablet               Today   CHIEF COMPLAINT:  No issues today no chest pain no SOB, no dizziness   VITAL SIGNS:  Blood pressure 102/62, pulse 71, temperature 97.9 F (36.6 C), temperature source Oral, resp. rate 18, height 5\' 2"  (1.575 m), weight 90.7 kg (199 lb 14.4 oz), SpO2 92 %.   REVIEW OF SYSTEMS:  Review of Systems  Constitutional: Negative.  Negative for chills, fever and malaise/fatigue.  HENT: Negative.  Negative for ear discharge, ear pain, hearing loss, nosebleeds and sore throat.   Eyes: Negative.  Negative for blurred vision and pain.  Respiratory: Negative.  Negative for cough, hemoptysis, shortness of breath and wheezing.   Cardiovascular: Negative.  Negative for chest pain, palpitations and leg swelling.  Gastrointestinal: Negative.  Negative for abdominal pain, blood in stool, diarrhea, nausea and vomiting.  Genitourinary: Negative.  Negative for dysuria.  Musculoskeletal: Negative.  Negative for back pain.  Skin: Negative.   Neurological: Negative for dizziness, tremors, speech change, focal weakness, seizures and headaches.  Endo/Heme/Allergies: Negative.  Does not bruise/bleed easily.  Psychiatric/Behavioral: Negative.  Negative for depression, hallucinations and suicidal ideas.     PHYSICAL EXAMINATION:  GENERAL:  82 y.o.-year-old patient lying  in the bed with no acute distress.  NECK:  Supple, no jugular venous distention. No thyroid enlargement, no tenderness.  LUNGS: Normal breath sounds bilaterally, no wheezing, rales,rhonchi  No use of accessory muscles of respiration.  CARDIOVASCULAR: S1, S2 normal. No murmurs, rubs, or gallops.  ABDOMEN: Soft, non-tender, non-distended. Bowel sounds present. No organomegaly or mass.  EXTREMITIES: No pedal edema, cyanosis, or clubbing.  PSYCHIATRIC: The patient is alert and oriented x 3.  SKIN: No obvious rash, lesion, or ulcer. Groin site without bleeding  DATA REVIEW:   CBC  Recent Labs Lab 04/22/16 0246  WBC 9.9  HGB 12.0  HCT 35.8  PLT 225    Chemistries   Recent Labs Lab 04/22/16 0246 04/24/16 0338  NA 140 139  K 3.7 3.9  CL 111 111  CO2 25 23  GLUCOSE 96 146*  BUN 12 9  CREATININE 0.66 0.66  CALCIUM 8.3* 8.0*  AST 17  --   ALT 11*  --   ALKPHOS 43  --   BILITOT 0.2*  --     Cardiac Enzymes  Recent Labs Lab 04/21/16 2057 04/22/16 0246 04/22/16 1016  TROPONINI 0.51* 0.52* 0.44*    Microbiology Results  @MICRORSLT48 @  RADIOLOGY:  No results found.    Management  plans discussed with the patient and she is in agreement. Stable for discharge home  Patient should follow up with cardiology  CODE STATUS:     Code Status Orders        Start     Ordered   04/21/16 2019  Full code  Continuous     04/21/16 2018    Code Status History    Date Active Date Inactive Code Status Order ID Comments User Context   04/21/2016  8:18 PM 04/22/2016  8:59 AM Full Code [id number]  Idelle Crouch, MD Inpatient    Advance Directive Documentation   Flowsheet Row Most Recent Value  Type of Advance Directive  Healthcare Power of Attorney, Living will  Pre-existing out of facility DNR order (yellow form or pink MOST form)  No data  "MOST" Form in Place?  No data      TOTAL TIME TAKING CARE OF THIS PATIENT: 37 minutes.    Note: This dictation was prepared  with Dragon dictation along with smaller phrase technology. Any transcriptional errors that result from this process are unintentional.  Shequilla Goodgame M.D on 04/24/2016 at 2:10 PM  Between 7am to 6pm - Pager - [phone] After 6pm go to [email or web address] - password [email or web address]  Office  [phone]  CC: Primary care physician; Idelle Crouch, MD

## 2016-04-22 NOTE — Care Management (Signed)
Met with patient. Presents from home where she lives with her husband.  She is independent, active and drives. Uses a cane and walker as needed. Uses walker mostly at home. PCP is Dr. [name]. Denies issues obtaining mediations, copays or medical care.  No home health needs anticipated.

## 2016-04-22 NOTE — Care Management Obs Status (Signed)
[place] NOTIFICATION   Patient Details  Name: Cassandra [name] MRN: [id number] Date of Birth: [birth year]   Medicare Observation Status Notification Given:  Yes    Jolly Mango, RN 04/22/2016, 9:46 AM

## 2016-04-22 NOTE — Progress Notes (Signed)
[facility] [place] NAME: Cassandra [name]    MR#:  [id number]  DATE OF BIRTH:  October 25, 1932  SUBJECTIVE:   Presented with weakness, functioning blood sugar and bradycardia. She was also having chest pain. All of that seems to have resolved.  REVIEW OF SYSTEMS:    Review of Systems  Constitutional: Negative.  Negative for chills, fever and malaise/fatigue.  HENT: Negative.  Negative for ear discharge, ear pain, hearing loss, nosebleeds and sore throat.   Eyes: Negative.  Negative for blurred vision and pain.  Respiratory: Negative.  Negative for cough, hemoptysis, shortness of breath and wheezing.   Cardiovascular: Negative.  Negative for chest pain, palpitations and leg swelling.  Gastrointestinal: Negative.  Negative for abdominal pain, blood in stool, diarrhea, nausea and vomiting.  Genitourinary: Negative.  Negative for dysuria.  Musculoskeletal: Negative.  Negative for back pain.  Skin: Negative.   Neurological: Negative for dizziness, tremors, speech change, focal weakness, seizures and headaches.  Endo/Heme/Allergies: Negative.  Does not bruise/bleed easily.  Psychiatric/Behavioral: Negative.  Negative for depression, hallucinations and suicidal ideas.    Tolerating Diet: yes      DRUG ALLERGIES:  No Known Allergies  VITALS:  Blood pressure (!) 159/46, pulse (!) 57, temperature 97.8 F (36.6 C), temperature source Oral, resp. rate 20, height 5\' 2"  (1.575 m), weight 88.9 kg (196 lb), SpO2 91 %.  PHYSICAL EXAMINATION:   Physical Exam  Constitutional: She is oriented to person, place, and time and well-developed, well-nourished, and in no distress. No distress.  HENT:  Head: Normocephalic.  Eyes: No scleral icterus.  Neck: Normal range of motion. Neck supple. No JVD present. No tracheal deviation present.  Cardiovascular: Normal rate, regular rhythm and normal heart sounds.  Exam reveals no gallop and no friction rub.   No  murmur heard. Pulmonary/Chest: Effort normal and breath sounds normal. No respiratory distress. She has no wheezes. She has no rales. She exhibits no tenderness.  Abdominal: Soft. Bowel sounds are normal. She exhibits no distension and no mass. There is no tenderness. There is no rebound and no guarding.  Musculoskeletal: Normal range of motion. She exhibits no edema.  Neurological: She is alert and oriented to person, place, and time.  Skin: Skin is warm. No rash noted. No erythema.  Psychiatric: Affect and judgment normal.      LABORATORY PANEL:   CBC  Recent Labs Lab 04/22/16 0246  WBC 9.9  HGB 12.0  HCT 35.8  PLT 225   ------------------------------------------------------------------------------------------------------------------  Chemistries   Recent Labs Lab 04/22/16 0246  NA 140  K 3.7  CL 111  CO2 25  GLUCOSE 96  BUN 12  CREATININE 0.66  CALCIUM 8.3*  AST 17  ALT 11*  ALKPHOS 43  BILITOT 0.2*   ------------------------------------------------------------------------------------------------------------------  Cardiac Enzymes  Recent Labs Lab 04/21/16 2057 04/22/16 0246 04/22/16 1016  TROPONINI 0.51* 0.52* 0.44*   ------------------------------------------------------------------------------------------------------------------  RADIOLOGY:  No results found.   ASSESSMENT AND PLAN:    82 year old female with a history of diabetes and hypertension who presents with low blood pressure and dizziness.  1. Bradycardia: Opal has been discontinued. Follow-up with cartilage or conditions. Follow-up on a cardiac arm.  2. Elevated troponin: This is likely due to demand ischemia and not ACS. Follow up on cardiac consult.  3. Diabetes: Patient has lower side blood sugars. Lantus has been discontinued. At discharge patient will continue metformin. And continue ADA diet. Next time patient will need follow-up with her  primary care physician.  4.  Hypothyroidism: Patient will continue Synthroid. TSH is within normal limits. Synthroid doses recently changed as per patient.  5. Hyperlipidemia: Continue Crestor.   Management plans discussed with the patient and she is in agreement.  CODE STATUS: full  TOTAL TIME TAKING CARE OF THIS PATIENT: 30 minutes.     POSSIBLE D/C today, DEPENDING ON CLINICAL CONDITION.   Norina Cowper M.D on 04/22/2016 at 1:30 PM  Between 7am to 6pm - Pager - [phone] After 6pm go to [email or web address] - password [email or web address] [facility]  Office  [phone]  CC: Primary care physician; Idelle Crouch, MD  Note: This dictation was prepared with Dragon dictation along with smaller phrase technology. Any transcriptional errors that result from this process are unintentional.

## 2016-04-22 NOTE — Progress Notes (Signed)
Getting cath in am should be Inpatient with NSTEMI

## 2016-04-22 NOTE — Progress Notes (Signed)
*  PRELIMINARY RESULTS* Echocardiogram 2D Echocardiogram has been performed.  Cassandra [name] 04/22/2016, 12:53 PM

## 2016-04-22 NOTE — Consult Note (Signed)
[facility] Consultation Note  Patient ID: Cassandra [name], MRN: [id number], DOB/AGE: Jun 12, 1933 82 y.o. Admit date: 04/21/2016   Date of Consult: 04/22/2016 Primary Physician: Idelle Crouch, MD Primary Cardiologist:Fath  Chief Complaint:  Chief Complaint  Patient presents with  . Dizziness   Reason for Consult: non-ST elevation myocardial infarction  HPI: 82 y.o. female with known coronary artery disease in the past with no evidence of previous myocardial infarction or intervention with diabetes with complication sleep apnea having new onset of chest discomfort weakness fatigue and shortness of breath. The patient has had an abnormal EKG as well with a troponin elevation of 0.52 consistent with non-ST elevation myocardial infarction. She has been short of breath weak fatigue with physical activity increasing in frequency and intensity over the last several months but now culminating when unable to do anything at all without having some chest discomfort and shortness of breath. She has had this when she was seen in the emergency room and now is completely resolved while at rest. She has been on appropriate statin therapy as well as hypertension medication management and currently stable  Past Medical History:  Diagnosis Date  . Acquired hypothyroidism 01/06/2014  . Arthritis   . B12 deficiency 02/15/2014  . Benign essential hypertension 01/06/2014  . Breast cancer [facility]) 1982   Right breast cancer - chemotherapy  . CAD (coronary artery disease), native coronary artery 01/06/2014  . Carotid artery calcification   . Chronic airway obstruction, not elsewhere classified 01/06/2014  . Diabetes mellitus without complication ([place])   . Pure hypercholesterolemia 01/06/2014  . Skin cancer   . Sleep apnea       Surgical History:  Past Surgical History:  Procedure Laterality Date  . AUGMENTATION MAMMAPLASTY Bilateral 1982/redo in 2014  . CAROTID ARTERY ANGIOPLASTY    . MASTECTOMY  Right 1982  . SKIN CANCER EXCISION       Home Meds: Prior to Admission medications   Medication Sig Start Date End Date Taking? Authorizing Provider  citalopram (CELEXA) 40 MG tablet TAKE ONE TABLET BY MOUTH EVERY NIGHT AT BEDTIME. 07/07/14  Yes Historical Provider, MD  HUMALOG KWIKPEN 100 UNIT/ML KiwkPen Inject 10-14 Units into the skin 3 (three) times daily before meals. *Use as directed per sliding scale* 03/03/15  Yes Historical Provider, MD  LANTUS SOLOSTAR 100 UNIT/ML Solostar Pen Inject 42 Units into the skin at bedtime.  05/19/15  Yes Historical Provider, MD  levothyroxine (SYNTHROID, LEVOTHROID) 137 MCG tablet Take 137 mcg by mouth every morning. 04/16/16  Yes Historical Provider, MD  meclizine (ANTIVERT) 25 MG tablet Take 25 mg by mouth every other day.  02/26/16  Yes Historical Provider, MD  metFORMIN (GLUCOPHAGE-XR) 500 MG 24 hr tablet Take 1,000 mg by mouth 2 (two) times daily. 02/13/16  Yes Historical Provider, MD  metoprolol tartrate (LOPRESSOR) 25 MG tablet Take 25 mg by mouth 2 (two) times daily. 04/15/16  Yes Historical Provider, MD  ropinirole (REQUIP) 5 MG tablet Take 5 mg by mouth at bedtime.  03/29/15  Yes Historical Provider, MD  rosuvastatin (CRESTOR) 10 MG tablet Take 10 mg by mouth at bedtime.  03/25/16  Yes Historical Provider, MD  aspirin EC 81 MG EC tablet Take 1 tablet (81 mg total) by mouth daily. 04/22/16   Bettey Costa, MD  colchicine 0.6 MG tablet Take 1 tablet (0.6 mg total) by mouth daily. 05/22/15   Max T Hyatt, DPM  insulin glargine (LANTUS) 100 UNIT/ML injection Inject 0.35 mLs (35 Units total)  into the skin at bedtime. 04/22/16   Bettey Costa, MD    Inpatient Medications:  . aspirin EC  81 mg Oral Daily  . citalopram  40 mg Oral QHS  . colchicine  0.6 mg Oral Daily  . docusate sodium  100 mg Oral BID  . heparin  5,000 Units Subcutaneous Q8H  . insulin aspart  0-5 Units Subcutaneous QHS  . insulin aspart  0-9 Units Subcutaneous TID WC  . insulin glargine  35 Units  Subcutaneous QHS  . levothyroxine  137 mcg Oral BH-[id number]  . nitroGLYCERIN  1 inch Topical Q6H  . ropinirole  5 mg Oral QHS  . rosuvastatin  10 mg Oral QHS  . sodium chloride flush  3 mL Intravenous Q12H   . sodium chloride 100 mL/hr at 04/22/16 1534    Allergies: No Known Allergies  Social History   Social History  . Marital status: Married    Spouse name: N/A  . Number of children: N/A  . Years of education: N/A   Occupational History  . Not on file.   Social History Main Topics  . Smoking status: Never Smoker  . Smokeless tobacco: Never Used  . Alcohol use No  . Drug use: No  . Sexual activity: Not on file   Other Topics Concern  . Not on file   Social History Narrative  . No narrative on file     Family History  Problem Relation Age of Onset  . Prostate cancer Neg Hx   . Kidney cancer Neg Hx      Review of Systems Positive forChest pain shortness of breath weakness fatigue Negative for: General:  chills, fever, night sweats or weight changes.  Cardiovascular: PND orthopnea syncope dizziness  Dermatological skin lesions rashes Respiratory: Cough congestion Urologic: Frequent urination urination at night and hematuria Abdominal: negative for nausea, vomiting, diarrhea, bright red blood per rectum, melena, or hematemesis Neurologic: negative for visual changes, and/or hearing changes  All other systems reviewed and are otherwise negative except as noted above.  Labs:  Recent Labs  04/21/16 1458 04/21/16 2057 04/22/16 0246 04/22/16 1016  TROPONINI 0.43* 0.51* 0.52* 0.44*   Lab Results  Component Value Date   WBC 9.9 04/22/2016   HGB 12.0 04/22/2016   HCT 35.8 04/22/2016   MCV 84.0 04/22/2016   PLT 225 04/22/2016    Recent Labs Lab 04/22/16 0246  NA 140  K 3.7  CL 111  CO2 25  BUN 12  CREATININE 0.66  CALCIUM 8.3*  PROT 6.0*  BILITOT 0.2*  ALKPHOS 43  ALT 11*  AST 17  GLUCOSE 96   No results found for: CHOL, HDL, LDLCALC, TRIG No  results found for: DDIMER  Radiology/Studies:  No results found.  [id number] sinus rhythm  Weights: Filed Weights   04/21/16 1435 04/22/16 0439  Weight: 195 lb (88.5 kg) 196 lb (88.9 kg)     Physical Exam: Blood pressure (!) 159/46, pulse (!) 57, temperature 97.8 F (36.6 C), temperature source Oral, resp. rate 20, height 5\' 2"  (1.575 m), weight 196 lb (88.9 kg), SpO2 91 %. Body mass index is 35.85 kg/m. General: Well developed, well nourished, in no acute distress. Head eyes ears nose throat: Normocephalic, atraumatic, sclera non-icteric, no xanthomas, nares are without discharge. No apparent thyromegaly and/or mass  Lungs: Normal respiratory effort.  no wheezes, no rales, no rhonchi.  Heart: RRR with normal S1 S2. no murmur gallop, no rub, PMI is normal size and placement, carotid upstroke  normal without bruit, jugular venous pressure is normal Abdomen: Soft, non-tender, non-distended with normoactive bowel sounds. No hepatomegaly. No rebound/guarding. No obvious abdominal masses. Abdominal aorta is normal size without bruit Extremities: No edema. no cyanosis, no clubbing, no ulcers  Peripheral : 2+ bilateral upper extremity pulses, 2+ bilateral femoral pulses, 2+ bilateral dorsal pedal pulse Neuro: Alert and oriented. No facial asymmetry. No focal deficit. Moves all extremities spontaneously. Musculoskeletal: Normal muscle tone without kyphosis Psych:  Responds to questions appropriately with a normal affect.    Assessment: 82 year old female with known coronary artery disease essential hypertension makes hyperlipidemia sleep apnea with the non-ST elevation myocardial infarction  Plan: 1. Aspirin for further risk reduction of myocardial infarction 2. Continue hypertension control with current medical regimen 3. High intensity cholesterol therapy with statin 4. Proceed to cardiac catheterization to assess coronary anatomy and further treatment thereof is necessary. Patient  understands the risk and benefits of cardiac catheterization. This includes a possibility of death stroke heart attack infection bleeding or blood clot. The patient is at lowest risk for conscious sedation  Signed, Corey Skains M.D. [facility] 04/22/2016, 5:01 PM

## 2016-04-22 NOTE — Progress Notes (Signed)
Pt. Slept throughout the night with no c/o pain, SOB or acute distress noted. Will continue monitoring pt.

## 2016-04-22 NOTE — Progress Notes (Addendum)
Inpatient Diabetes Program Recommendations  AACE/ADA: New Consensus Statement on Inpatient Glycemic Control (2015)  Target Ranges:  Prepandial:   less than 140 mg/dL      Peak postprandial:   less than 180 mg/dL (1-2 hours)      Critically ill patients:  140 - 180 mg/dL   Results for Cassandra [name], Cassandra [name] (MRN [id number]) as of 04/22/2016 10:49  Ref. Range 04/21/2016 21:30 04/22/2016 07:33 04/22/2016 07:52  Glucose-Capillary Latest Ref Range: 65 - 99 mg/dL 163 (H) 67 76   Review of Glycemic Control  Diabetes history: DM2 Outpatient Diabetes medications: Lantus 42 units QHS, Humalog 10-14 units TID with meals, Metformin XR 1000 mg BID Current orders for Inpatient glycemic control: Lantus 42 units QHS, Novolog 0-9 units TID with meals, Novolog 0-5 units QHS  Inpatient Diabetes Program Recommendations: Insulin - Basal: Fasting glucose 67 mg/dl this morning at 7:33 am. Please consider decreasing Lantus to 35 units QHS.  NOTE: In reviewing the chart, noted patient is followed by Dr. Eddie Dibbles for diabetes management and was last seen by Dr. Eddie Dibbles on 04/16/2016. Patient's A1C was 8.9% on 04/04/2016.   Thanks, Barnie Alderman, RN, MSN, CDE Diabetes Coordinator Inpatient Diabetes Program [phone] (Team Pager from [place] to [place]) [phone] ([facility] office) [phone] [facility] office) [phone] [facility] office)

## 2016-04-22 NOTE — Progress Notes (Signed)
Pt. Arrived via stretcher transferred to bed with stand by assist. Tele placed, verified with Merry Proud, CNA. Skin warm and dry second verifying nurse, Lexi, RN. Pt. Has had a right breast mastectomy. General room orientation given. Instruction on how to use ascom/call bell system given. Pt. Alert and oriented with no signs or c/o pain, acute distress or SOB observed. Pt. Joking with staff and family. Sandwich box given. Will continue to monitor pt.

## 2016-04-23 ENCOUNTER — Encounter
Admission: EM | Disposition: A | Discharge status: Home / Self Care | Payer: Self-pay | Source: Home / Self Care | Attending: Internal Medicine

## 2016-04-23 HISTORY — PX: CARDIAC CATHETERIZATION: SHX172

## 2016-04-23 LAB — GLUCOSE, CAPILLARY
Glucose-Capillary: 99 mg/dL (ref 65–99)
Glucose-Capillary: 88 mg/dL (ref 65–99)
Glucose-Capillary: 199 mg/dL — ABNORMAL HIGH (ref 65–99)

## 2016-04-23 SURGERY — LEFT HEART CATH AND CORONARY ANGIOGRAPHY
Anesthesia: Moderate Sedation

## 2016-04-23 MED ORDER — LABETALOL HCL 5 MG/ML IV SOLN
INTRAVENOUS | Status: AC
  Filled 2016-04-23: qty 4

## 2016-04-23 MED ORDER — HEPARIN (PORCINE) IN NACL 2-0.9 UNIT/ML-% IJ SOLN
INTRAMUSCULAR | Status: AC
  Filled 2016-04-23: qty 500

## 2016-04-23 MED ORDER — NITROGLYCERIN 5 MG/ML IV SOLN
INTRAVENOUS | Status: AC
  Filled 2016-04-23: qty 10

## 2016-04-23 MED ORDER — FENTANYL CITRATE (PF) 100 MCG/2ML IJ SOLN
INTRAMUSCULAR | Status: DC | PRN
  Administered 2016-04-23: 50 ug via INTRAVENOUS
  Administered 2016-04-23 (×2): 25 ug via INTRAVENOUS

## 2016-04-23 MED ORDER — SODIUM CHLORIDE 0.9 % IV SOLN: 250.0000 mL | INTRAVENOUS | Status: DC | PRN

## 2016-04-23 MED ORDER — CLOPIDOGREL BISULFATE 75 MG PO TABS
ORAL_TABLET | ORAL | Status: DC | PRN
  Administered 2016-04-23: 600 mg via ORAL

## 2016-04-23 MED ORDER — SODIUM CHLORIDE 0.9 % IV SOLN
INTRAVENOUS | Status: DC | PRN
  Administered 2016-04-23: 1.75 mg/kg/h via INTRAVENOUS

## 2016-04-23 MED ORDER — SODIUM CHLORIDE 0.9% FLUSH
3.0000 mL | Freq: Two times a day (BID) | INTRAVENOUS | Status: DC
  Administered 2016-04-23: 3 mL via INTRAVENOUS

## 2016-04-23 MED ORDER — SODIUM CHLORIDE 0.9 % IV SOLN
0.2500 mg/kg/h | INTRAVENOUS | Status: AC
  Filled 2016-04-23: qty 250

## 2016-04-23 MED ORDER — CLOPIDOGREL BISULFATE 75 MG PO TABS
ORAL_TABLET | ORAL | Status: AC
  Filled 2016-04-23: qty 8

## 2016-04-23 MED ORDER — BIVALIRUDIN BOLUS VIA INFUSION - CUPID
INTRAVENOUS | Status: DC | PRN
  Administered 2016-04-23: 66.675 mg via INTRAVENOUS

## 2016-04-23 MED ORDER — ACETAMINOPHEN 325 MG PO TABS: 650.0000 mg | ORAL_TABLET | ORAL | Status: DC | PRN

## 2016-04-23 MED ORDER — SODIUM CHLORIDE 0.9 % IV BOLUS (SEPSIS): 500.0000 mL | Freq: Once | INTRAVENOUS | Status: DC

## 2016-04-23 MED ORDER — IOPAMIDOL (ISOVUE-300) INJECTION 61%
INTRAVENOUS | Status: DC | PRN
  Administered 2016-04-23: 260 mL via INTRA_ARTERIAL
  Administered 2016-04-23: 170 mL via INTRA_ARTERIAL

## 2016-04-23 MED ORDER — MIDAZOLAM HCL 2 MG/2ML IJ SOLN
INTRAMUSCULAR | Status: AC
  Filled 2016-04-23: qty 2

## 2016-04-23 MED ORDER — ASPIRIN 81 MG PO CHEW
CHEWABLE_TABLET | ORAL | Status: AC
  Filled 2016-04-23: qty 3

## 2016-04-23 MED ORDER — ASPIRIN EC 325 MG PO TBEC
325.0000 mg | DELAYED_RELEASE_TABLET | Freq: Every day | ORAL | Status: DC
  Filled 2016-04-23: qty 1

## 2016-04-23 MED ORDER — ONDANSETRON HCL 4 MG/2ML IJ SOLN: 4.0000 mg | Freq: Four times a day (QID) | INTRAMUSCULAR | Status: DC | PRN

## 2016-04-23 MED ORDER — LABETALOL HCL 5 MG/ML IV SOLN
INTRAVENOUS | Status: DC | PRN
  Administered 2016-04-23: 20 mg via INTRAVENOUS

## 2016-04-23 MED ORDER — FENTANYL CITRATE (PF) 100 MCG/2ML IJ SOLN
INTRAMUSCULAR | Status: AC
  Filled 2016-04-23: qty 2

## 2016-04-23 MED ORDER — SODIUM CHLORIDE 0.9% FLUSH: 3.0000 mL | INTRAVENOUS | Status: DC | PRN

## 2016-04-23 MED ORDER — HYDRALAZINE HCL 20 MG/ML IJ SOLN
INTRAMUSCULAR | Status: AC
  Filled 2016-04-23: qty 1

## 2016-04-23 MED ORDER — CLOPIDOGREL BISULFATE 75 MG PO TABS
75.0000 mg | ORAL_TABLET | Freq: Every day | ORAL | Status: DC
  Administered 2016-04-24: 75 mg via ORAL
  Filled 2016-04-23: qty 1

## 2016-04-23 MED ORDER — ISOSORBIDE MONONITRATE ER 30 MG PO TB24
30.0000 mg | ORAL_TABLET | Freq: Every day | ORAL | Status: DC
  Administered 2016-04-23 – 2016-04-24 (×2): 30 mg via ORAL
  Filled 2016-04-23 (×2): qty 1

## 2016-04-23 MED ORDER — HYDRALAZINE HCL 20 MG/ML IJ SOLN
INTRAMUSCULAR | Status: DC | PRN
  Administered 2016-04-23 (×2): 10 mg via INTRAVENOUS

## 2016-04-23 MED ORDER — SODIUM CHLORIDE 0.9 % WEIGHT BASED INFUSION: 3.0000 mL/kg/h | INTRAVENOUS | Status: AC

## 2016-04-23 MED ORDER — MIDAZOLAM HCL 2 MG/2ML IJ SOLN
INTRAMUSCULAR | Status: DC | PRN
  Administered 2016-04-23: 0.5 mg via INTRAVENOUS
  Administered 2016-04-23: 1 mg via INTRAVENOUS
  Administered 2016-04-23: 0.5 mg via INTRAVENOUS

## 2016-04-23 MED ORDER — BIVALIRUDIN 250 MG IV SOLR
INTRAVENOUS | Status: AC
  Filled 2016-04-23: qty 250

## 2016-04-23 MED ORDER — NITROGLYCERIN 1 MG/10 ML FOR IR/CATH LAB
INTRA_ARTERIAL | Status: DC | PRN
  Administered 2016-04-23: 200 ug via INTRACORONARY

## 2016-04-23 MED ORDER — ASPIRIN 81 MG PO CHEW
CHEWABLE_TABLET | ORAL | Status: DC | PRN
  Administered 2016-04-23: 243 mg via ORAL

## 2016-04-23 SURGICAL SUPPLY — 16 items
BALLN TREK RX 2.5X15 (BALLOONS) ×4
BALLOON TREK RX 2.5X15 (BALLOONS) ×2 IMPLANT
CATH INFINITI 5FR ANG PIGTAIL (CATHETERS) ×4 IMPLANT
CATH INFINITI 5FR JL4 (CATHETERS) ×4 IMPLANT
CATH INFINITI JR4 5F (CATHETERS) ×4 IMPLANT
CATH VISTA GUIDE 6FR XB3 SH (CATHETERS) ×4 IMPLANT
DEVICE CLOSURE MYNXGRIP 6/7F (Vascular Products) ×4 IMPLANT
DEVICE INFLAT 30 PLUS (MISCELLANEOUS) ×4 IMPLANT
KIT MANI 3VAL PERCEP (MISCELLANEOUS) ×4 IMPLANT
NEEDLE PERC 18GX7CM (NEEDLE) ×4 IMPLANT
PACK CARDIAC CATH (CUSTOM PROCEDURE TRAY) ×4 IMPLANT
SHEATH AVANTI 6FR X 11CM (SHEATH) ×4 IMPLANT
SHEATH PINNACLE 5F 10CM (SHEATH) ×4 IMPLANT
STENT XIENCE ALPINE RX 3.25X15 (Permanent Stent) ×4 IMPLANT
WIRE EMERALD 3MM-J .035X150CM (WIRE) ×4 IMPLANT
WIRE G HI TQ BMW 190 (WIRE) ×4 IMPLANT

## 2016-04-23 NOTE — Progress Notes (Signed)
[facility] Cardiology [facility] Encounter Note  Patient: Cassandra [name] / Admit Date: 04/21/2016 / Date of Encounter: 04/23/2016, 12:46 PM   Subjective: Patient has had no evidence of chest discomfort. Still short of breath and weak. Echocardiogram shows normal LV systolic function with ejection fraction of 55-60% Cardiac catheterization shows normal LV systolic function with occluded right coronary artery with appropriate collateral flow nondominant Additionally the patient has a significant 75% proximal left anterior descending artery stenosis with calcification  Review of Systems: Positive for: Weakness shortness of breath Negative for: Vision change, hearing change, syncope, dizziness, nausea, vomiting,diarrhea, bloody stool, stomach pain, cough, congestion, diaphoresis, urinary frequency, urinary pain,skin lesions, skin rashes Others previously listed  Objective: Telemetry: Normal sinus rhythm Physical Exam: Blood pressure (!) 157/83, pulse (!) 56, temperature 98.2 F (36.8 C), temperature source Oral, resp. rate 17, height 5\' 2"  (1.575 m), weight 196 lb (88.9 kg), SpO2 93 %. Body mass index is 35.85 kg/m. General: Well developed, well nourished, in no acute distress. Head: Normocephalic, atraumatic, sclera non-icteric, no xanthomas, nares are without discharge. Neck: No apparent masses Lungs: Normal respirations with no wheezes, no rhonchi, no rales , no crackles   Heart: Regular rate and rhythm, normal S1 S2, no murmur, no rub, no gallop, PMI is normal size and placement, carotid upstroke normal without bruit, jugular venous pressure normal Abdomen: Soft, non-tender, non-distended with normoactive bowel sounds. No hepatosplenomegaly. Abdominal aorta is normal size without bruit Extremities: No edema, no clubbing, no cyanosis, no ulcers,  Peripheral: 2+ radial, 2+ femoral, 2+ dorsal pedal pulses Neuro: Alert and oriented. Moves all extremities spontaneously. Psych:  Responds  to questions appropriately with a normal affect.   Intake/Output Summary (Last 24 hours) at 04/23/16 1246 Last data filed at 04/22/16 2330  Gross per 24 hour  Intake          1146.67 ml  Output             2300 ml  Net         -1153.33 ml    Inpatient Medications:  . [MAR Hold] aspirin EC  81 mg Oral Daily  . [MAR Hold] citalopram  40 mg Oral QHS  . [MAR Hold] colchicine  0.6 mg Oral Daily  . [MAR Hold] docusate sodium  100 mg Oral BID  . [MAR Hold] heparin  5,000 Units Subcutaneous Q8H  . [MAR Hold] insulin aspart  0-5 Units Subcutaneous QHS  . [MAR Hold] insulin aspart  0-9 Units Subcutaneous TID WC  . [MAR Hold] insulin glargine  35 Units Subcutaneous QHS  . [MAR Hold] levothyroxine  137 mcg Oral BH-q7a  . [MAR Hold] nitroGLYCERIN  1 inch Topical Q6H  . [MAR Hold] ropinirole  5 mg Oral QHS  . [MAR Hold] rosuvastatin  10 mg Oral QHS  . [MAR Hold] sodium chloride flush  3 mL Intravenous Q12H  . sodium chloride flush  3 mL Intravenous Q12H   Infusions:  . sodium chloride 100 mL/hr at 04/23/16 0200  . sodium chloride 1 mL/kg/hr (04/23/16 0821)    Labs:  Recent Labs  04/21/16 1458 04/22/16 0246  NA 138 140  K 4.4 3.7  CL 105 111  CO2 25 25  GLUCOSE 370* 96  BUN 14 12  CREATININE 0.86 0.66  CALCIUM 9.0 8.3*    Recent Labs  04/22/16 0246  AST 17  ALT 11*  ALKPHOS 43  BILITOT 0.2*  PROT 6.0*  ALBUMIN 2.9*    Recent Labs  04/21/16 1458  04/22/16 0246  WBC 10.9 9.9  HGB 12.6 12.0  HCT 38.9 35.8  MCV 85.7 84.0  PLT 249 225    Recent Labs  04/21/16 1458 04/21/16 2057 04/22/16 0246 04/22/16 1016  TROPONINI 0.43* 0.51* 0.52* 0.44*   Invalid input(s): POCBNP No results for input(s): HGBA1C in the last 72 hours.   Weights: Filed Weights   04/21/16 1435 04/22/16 0439  Weight: 195 lb (88.5 kg) 196 lb (88.9 kg)     Radiology/Studies:  No results found.   Assessment and Recommendation  82 y.o. female with diabetes with complication essential  hypertension mixed hyperlipidemia and acute non-ST elevation myocardial infarction with an occluded right coronary artery and significant stenosis of left anterior descending artery likely causing symptoms and event 1. PCI and stent placement of left anterior descending artery had significant stenosis 2. Medical management of a chronically occluded right coronary artery with good collaterals 3. High intensity cholesterol therapy 4. Aspirin and Plavix for myocardial infarction X 5. Begin ambulation and follow for worsening symptoms or improvements and further recommendation of cardiac rehabilitation  Signed, Serafina Royals M.D. FACC

## 2016-04-23 NOTE — Progress Notes (Signed)
[facility] [place] NAME: Cassandra [name]    MR#:  [id number]  DATE OF BIRTH:  [birth year]  SUBJECTIVE:  Patient underwent cardiac catheterization today. She denies dizziness, chest pain or short of breath.  REVIEW OF SYSTEMS:    Review of Systems  Constitutional: Negative.  Negative for chills, fever and malaise/fatigue.  HENT: Negative.  Negative for ear discharge, ear pain, hearing loss, nosebleeds and sore throat.   Eyes: Negative.  Negative for blurred vision and pain.  Respiratory: Negative.  Negative for cough, hemoptysis, shortness of breath and wheezing.   Cardiovascular: Negative.  Negative for chest pain, palpitations and leg swelling.  Gastrointestinal: Negative.  Negative for abdominal pain, blood in stool, diarrhea, nausea and vomiting.  Genitourinary: Negative.  Negative for dysuria.  Musculoskeletal: Negative.  Negative for back pain.  Skin: Negative.   Neurological: Negative for dizziness, tremors, speech change, focal weakness, seizures and headaches.  Endo/Heme/Allergies: Negative.  Does not bruise/bleed easily.  Psychiatric/Behavioral: Negative.  Negative for depression, hallucinations and suicidal ideas.    Tolerating Diet: npo     DRUG ALLERGIES:  No Known Allergies  VITALS:  Blood pressure (!) 157/83, pulse (!) 56, temperature 98.2 F (36.8 C), temperature source Oral, resp. rate 17, height 5\' 2"  (1.575 m), weight 88.9 kg (196 lb), SpO2 93 %.  PHYSICAL EXAMINATION:   Physical Exam  Constitutional: She is oriented to person, place, and time and well-developed, well-nourished, and in no distress. No distress.  HENT:  Head: Normocephalic.  Eyes: No scleral icterus.  Neck: Normal range of motion. Neck supple. No JVD present. No tracheal deviation present.  Cardiovascular: Normal rate, regular rhythm and normal heart sounds.  Exam reveals no gallop and no friction rub.   No murmur heard. Pulmonary/Chest:  Effort normal and breath sounds normal. No respiratory distress. She has no wheezes. She has no rales. She exhibits no tenderness.  Abdominal: Soft. Bowel sounds are normal. She exhibits no distension and no mass. There is no tenderness. There is no rebound and no guarding.  Musculoskeletal: Normal range of motion. She exhibits no edema.  Neurological: She is alert and oriented to person, place, and time.  Skin: Skin is warm. No rash noted. No erythema.  Psychiatric: Affect and judgment normal.      LABORATORY PANEL:   CBC  Recent Labs Lab 04/22/16 0246  WBC 9.9  HGB 12.0  HCT 35.8  PLT 225   ------------------------------------------------------------------------------------------------------------------  Chemistries   Recent Labs Lab 04/22/16 0246  NA 140  K 3.7  CL 111  CO2 25  GLUCOSE 96  BUN 12  CREATININE 0.66  CALCIUM 8.3*  AST 17  ALT 11*  ALKPHOS 43  BILITOT 0.2*   ------------------------------------------------------------------------------------------------------------------  Cardiac Enzymes  Recent Labs Lab 04/21/16 2057 04/22/16 0246 04/22/16 1016  TROPONINI 0.51* 0.52* 0.44*   ------------------------------------------------------------------------------------------------------------------  RADIOLOGY:  No results found.   ASSESSMENT AND PLAN:    82 year old female with a history of diabetes and hypertension who presents with low blood pressure and dizzinessAnd found to have non-ST elevation MI.  1. Non-ST elevation [id number] shows normal LV systolic function with ejection fraction of 55-60% Cardiac catheterization shows normal LV systolic function with occluded right coronary artery with appropriate collateral flow nondominant Additionally the patient has a significant 75% proximal left anterior descending artery stenosis with calcification Medical management is advised. Continue aspirin, Plavix, high intensity statin. I will  also add isosorbide. Cardiac rehabilitation referral as an outpatient.  2. Bradycardia: Currently metoprolol is discontinued due to symptoms of bradycardia.  3. Diabetes: Continue current regimen. And any ADA diet and signs constant.  4. Hypothyroidism: Patient will continue Synthroid. TSH is within normal limits. Synthroid doses recently changed as per patient.  5. Hyperlipidemia: Continue Crestor.   Management plans discussed with the patient and she is in agreement.  CODE STATUS: full  TOTAL TIME TAKING CARE OF THIS PATIENT: 28 minutes.  D/w dr Nehemiah Massed   POSSIBLE D/C tomorrow, DEPENDING ON CLINICAL CONDITION.   Cassandra [name] M.D on 04/23/2016 at 1:17 PM  Between 7am to 6pm - Pager - [phone] After 6pm go to [email or web address] - password [email or web address] [facility]  Office  [phone]  CC: Primary care physician; Cassandra Crouch, MD  Note: This dictation was prepared with Dragon dictation along with smaller phrase technology. Any transcriptional errors that result from this process are unintentional.

## 2016-04-23 NOTE — Progress Notes (Signed)
Patient returned to unit from cardiac cath, alert and oriented, denies any pain right vascular site dry and no bleeding noted.

## 2016-04-23 NOTE — Progress Notes (Signed)
Paged Dr Nehemiah Massed and Dr Clayborn Bigness to relay message of low blood pressure, awaiting callback.

## 2016-04-23 NOTE — Progress Notes (Signed)
Patient ID: Cassandra [name], female   DOB: 05-30-1933, 82 y.o.   MRN: [id number]

## 2016-04-23 NOTE — Progress Notes (Signed)
Patient off the unit to specials at this time

## 2016-04-23 NOTE — Care Management (Signed)
Patient placed in observation 8/20 but admitted 8/21 with nstemi.  Cardiac cath today with pci.  There is an outpatient cardiac rehab order present.  At present, patient is not on any cost prohibitive ant platelet medication

## 2016-04-23 NOTE — Progress Notes (Signed)
Dr. Nehemiah Massed returned paged re low BP.  Order received for IVF bolus.

## 2016-04-23 NOTE — Progress Notes (Signed)
Patient receiving IVF bolus.  BP 103/61

## 2016-04-23 NOTE — Progress Notes (Signed)
Patient alert and oriented, resting in the bed at this time, denies any pain, patient NPO scheduled for cardiac cath today, report given to nurse in cath lab.

## 2016-04-24 ENCOUNTER — Encounter: Payer: Self-pay | Admitting: Internal Medicine

## 2016-04-24 LAB — BASIC METABOLIC PANEL
Anion gap: 5 (ref 5–15)
BUN: 9 mg/dL (ref 6–20)
CALCIUM: 8 mg/dL — AB (ref 8.9–10.3)
CO2: 23 mmol/L (ref 22–32)
CREATININE: 0.66 mg/dL (ref 0.44–1.00)
Chloride: 111 mmol/L (ref 101–111)
GFR calc non Af Amer: >60 mL/min (ref >60)
Glucose, Bld: 146 mg/dL — ABNORMAL HIGH (ref 65–99)
Potassium: 3.9 mmol/L (ref 3.5–5.1)
SODIUM: 139 mmol/L (ref 135–145)

## 2016-04-24 LAB — GLUCOSE, CAPILLARY: GLUCOSE-CAPILLARY: 97 mg/dL (ref 65–99)

## 2016-04-24 MED ORDER — CLOPIDOGREL BISULFATE 75 MG PO TABS: 75.0000 mg | ORAL_TABLET | Freq: Every day | ORAL | 0 refills | Status: DC

## 2016-04-24 MED ORDER — METFORMIN HCL ER 500 MG PO TB24
1000.0000 mg | ORAL_TABLET | Freq: Two times a day (BID) | ORAL | 0 refills | Status: DC
Start: 2016-04-24 — End: 2018-08-21

## 2016-04-24 MED ORDER — LISINOPRIL 2.5 MG PO TABS: 2.5000 mg | ORAL_TABLET | Freq: Every day | ORAL | 0 refills | Status: DC

## 2016-04-24 MED ORDER — ISOSORBIDE MONONITRATE ER 30 MG PO TB24: 30.0000 mg | ORAL_TABLET | Freq: Every day | ORAL | 0 refills | Status: DC

## 2016-04-24 MED ORDER — LISINOPRIL 5 MG PO TABS
2.5000 mg | ORAL_TABLET | Freq: Every day | ORAL | Status: DC
  Administered 2016-04-24: 2.5 mg via ORAL
  Filled 2016-04-24: qty 1

## 2016-04-24 MED ORDER — NITROGLYCERIN 0.4 MG SL SUBL: 0.4000 mg | SUBLINGUAL_TABLET | SUBLINGUAL | 0 refills | Status: AC | PRN

## 2016-04-24 NOTE — Progress Notes (Signed)
[facility] Cardiology [facility] Encounter Note  Patient: Cassandra [name] / Admit Date: 04/21/2016 / Date of Encounter: 04/24/2016, 8:42 AM   Subjective: Patient has had no evidence of chest discomfort. Significant improvements of symptoms after PCI and stent placement of left anterior descending artery Echocardiogram shows normal LV systolic function with ejection fraction of 55-60% Cardiac catheterization shows normal LV systolic function with occluded right coronary artery with appropriate collateral flow nondominant Additionally the patient has a significant 75% proximal left anterior descending artery stenosis with calcification now status post PCI and stent placement successfully  Review of Systems: Positive for: Weakness shortness of breath Negative for: Vision change, hearing change, syncope, dizziness, nausea, vomiting,diarrhea, bloody stool, stomach pain, cough, congestion, diaphoresis, urinary frequency, urinary pain,skin lesions, skin rashes Others previously listed  Objective: Telemetry: Normal sinus rhythm Physical Exam: Blood pressure (!) 133/44, pulse 66, temperature 97.9 F (36.6 C), temperature source Oral, resp. rate 20, height 5\' 2"  (1.575 m), weight 199 lb 14.4 oz (90.7 kg), SpO2 95 %. Body mass index is 36.56 kg/m. General: Well developed, well nourished, in no acute distress. Head: Normocephalic, atraumatic, sclera non-icteric, no xanthomas, nares are without discharge. Neck: No apparent masses Lungs: Normal respirations with no wheezes, no rhonchi, no rales , no crackles   Heart: Regular rate and rhythm, normal S1 S2, no murmur, no rub, no gallop, PMI is normal size and placement, carotid upstroke normal without bruit, jugular venous pressure normal Abdomen: Soft, non-tender, non-distended with normoactive bowel sounds. No hepatosplenomegaly. Abdominal aorta is normal size without bruit Extremities: No edema, no clubbing, no cyanosis, no ulcers,  Peripheral: 2+  radial, 2+ femoral, 2+ dorsal pedal pulses Neuro: Alert and oriented. Moves all extremities spontaneously. Psych:  Responds to questions appropriately with a normal affect.   Intake/Output Summary (Last 24 hours) at 04/24/16 0842 Last data filed at 04/24/16 0528  Gross per 24 hour  Intake             3520 ml  Output             1350 ml  Net             2170 ml    Inpatient Medications:  . aspirin EC  325 mg Oral Daily  . aspirin EC  81 mg Oral Daily  . citalopram  40 mg Oral QHS  . clopidogrel  75 mg Oral Q breakfast  . colchicine  0.6 mg Oral Daily  . docusate sodium  100 mg Oral BID  . heparin  5,000 Units Subcutaneous Q8H  . insulin aspart  0-5 Units Subcutaneous QHS  . insulin aspart  0-9 Units Subcutaneous TID WC  . insulin glargine  35 Units Subcutaneous QHS  . isosorbide mononitrate  30 mg Oral Daily  . levothyroxine  137 mcg Oral BH-q7a  . lisinopril  2.5 mg Oral Daily  . nitroGLYCERIN  1 inch Topical Q6H  . ropinirole  5 mg Oral QHS  . rosuvastatin  10 mg Oral QHS  . sodium chloride  500 mL Intravenous Once  . sodium chloride flush  3 mL Intravenous Q12H  . sodium chloride flush  3 mL Intravenous Q12H   Infusions:     Labs:  Recent Labs  04/22/16 0246 04/24/16 0338  NA 140 139  K 3.7 3.9  CL 111 111  CO2 25 23  GLUCOSE 96 146*  BUN 12 9  CREATININE 0.66 0.66  CALCIUM 8.3* 8.0*    Recent Labs  04/22/16 0246  AST 17  ALT 11*  ALKPHOS 43  BILITOT 0.2*  PROT 6.0*  ALBUMIN 2.9*    Recent Labs  04/21/16 1458 04/22/16 0246  WBC 10.9 9.9  HGB 12.6 12.0  HCT 38.9 35.8  MCV 85.7 84.0  PLT 249 225    Recent Labs  04/21/16 1458 04/21/16 2057 04/22/16 0246 04/22/16 1016  TROPONINI 0.43* 0.51* 0.52* 0.44*   Invalid input(s): POCBNP No results for input(s): HGBA1C in the last 72 hours.   Weights: Filed Weights   04/21/16 1435 04/22/16 0439 04/24/16 0531  Weight: 195 lb (88.5 kg) 196 lb (88.9 kg) 199 lb 14.4 oz (90.7 kg)      Radiology/Studies:  No results found.   Assessment and Recommendation  82 y.o. female with diabetes with complication essential hypertension mixed hyperlipidemia and acute non-ST elevation myocardial infarction with an occluded right coronary artery and significant stenosis of left anterior descending artery likely causing symptoms and event status post PCI and stent placement of left anterior descending artery 1. PCI and stent placement of left anterior descending artery had significant stenosis with success at this time with no evidence of symptoms 2. Medical management of a chronically occluded right coronary artery with good collaterals 3. High intensity cholesterol therapy 4. Aspirin and Plavix for myocardial infarction and recent stent placement 5. Begin ambulation and follow for worsening symptoms or improvements and further recommendation of cardiac rehabilitation 6. Okay for discharge to home today with follow-up within 2 weeks  Signed, Serafina Royals M.D. FACC

## 2016-04-24 NOTE — Care Management (Signed)
No discharge needs identified by members of the care team or patient 

## 2016-04-24 NOTE — Care Management Important Message (Signed)
Important Message  Patient Details  Name: Cassandra [name] MRN: [id number] Date of Birth: [birth year]   Medicare Important Message Given:  Yes    Katrina Stack, RN 04/24/2016, 9:45 AM

## 2016-05-22 ENCOUNTER — Other Ambulatory Visit: Payer: Self-pay | Admitting: Internal Medicine

## 2016-05-22 DIAGNOSIS — Z1231 Encounter for screening mammogram for malignant neoplasm of breast: Secondary | ICD-10-CM

## 2016-06-06 ENCOUNTER — Inpatient Hospital Stay
Admission: EM | Admit: 2016-06-06 | Discharge: 2016-06-08 | DRG: 310 | Disposition: A | Discharge status: Home / Self Care | Payer: Medicare Other | Attending: Internal Medicine | Admitting: Internal Medicine

## 2016-06-06 ENCOUNTER — Emergency Department: Payer: Medicare Other

## 2016-06-06 ENCOUNTER — Encounter: Payer: Self-pay | Admitting: Emergency Medicine

## 2016-06-06 DIAGNOSIS — I252 Old myocardial infarction: Secondary | ICD-10-CM

## 2016-06-06 DIAGNOSIS — Z794 Long term (current) use of insulin: Secondary | ICD-10-CM

## 2016-06-06 DIAGNOSIS — R55 Syncope and collapse: Secondary | ICD-10-CM | POA: Diagnosis not present

## 2016-06-06 DIAGNOSIS — G4730 Sleep apnea, unspecified: Secondary | ICD-10-CM | POA: Diagnosis present

## 2016-06-06 DIAGNOSIS — G9340 Encephalopathy, unspecified: Secondary | ICD-10-CM

## 2016-06-06 DIAGNOSIS — Z955 Presence of coronary angioplasty implant and graft: Secondary | ICD-10-CM

## 2016-06-06 DIAGNOSIS — Z7902 Long term (current) use of antithrombotics/antiplatelets: Secondary | ICD-10-CM

## 2016-06-06 DIAGNOSIS — T447X5A Adverse effect of beta-adrenoreceptor antagonists, initial encounter: Secondary | ICD-10-CM | POA: Diagnosis present

## 2016-06-06 DIAGNOSIS — I2510 Atherosclerotic heart disease of native coronary artery without angina pectoris: Secondary | ICD-10-CM | POA: Diagnosis present

## 2016-06-06 DIAGNOSIS — Z85828 Personal history of other malignant neoplasm of skin: Secondary | ICD-10-CM

## 2016-06-06 DIAGNOSIS — I10 Essential (primary) hypertension: Secondary | ICD-10-CM | POA: Diagnosis present

## 2016-06-06 DIAGNOSIS — E7800 Pure hypercholesterolemia, unspecified: Secondary | ICD-10-CM | POA: Diagnosis present

## 2016-06-06 DIAGNOSIS — Z79899 Other long term (current) drug therapy: Secondary | ICD-10-CM

## 2016-06-06 DIAGNOSIS — J449 Chronic obstructive pulmonary disease, unspecified: Secondary | ICD-10-CM | POA: Diagnosis present

## 2016-06-06 DIAGNOSIS — Z853 Personal history of malignant neoplasm of breast: Secondary | ICD-10-CM

## 2016-06-06 DIAGNOSIS — I214 Non-ST elevation (NSTEMI) myocardial infarction: Secondary | ICD-10-CM

## 2016-06-06 DIAGNOSIS — R001 Bradycardia, unspecified: Secondary | ICD-10-CM | POA: Diagnosis not present

## 2016-06-06 DIAGNOSIS — E039 Hypothyroidism, unspecified: Secondary | ICD-10-CM | POA: Diagnosis present

## 2016-06-06 DIAGNOSIS — E119 Type 2 diabetes mellitus without complications: Secondary | ICD-10-CM | POA: Diagnosis present

## 2016-06-06 DIAGNOSIS — Z7982 Long term (current) use of aspirin: Secondary | ICD-10-CM

## 2016-06-06 DIAGNOSIS — E118 Type 2 diabetes mellitus with unspecified complications: Secondary | ICD-10-CM

## 2016-06-06 LAB — URINE DRUG SCREEN, QUALITATIVE (ARMC ONLY)
Amphetamines, Ur Screen: NOT DETECTED
BARBITURATES, UR SCREEN: NOT DETECTED
Benzodiazepine, Ur Scrn: POSITIVE — AB
CANNABINOID 50 NG, UR ~~LOC~~: NOT DETECTED
COCAINE METABOLITE, UR ~~LOC~~: NOT DETECTED
MDMA (ECSTASY) UR SCREEN: NOT DETECTED
Methadone Scn, Ur: NOT DETECTED
Opiate, Ur Screen: NOT DETECTED
PHENCYCLIDINE (PCP) UR S: NOT DETECTED
Tricyclic, Ur Screen: NOT DETECTED

## 2016-06-06 LAB — URINALYSIS COMPLETE WITH MICROSCOPIC (ARMC ONLY)
Bacteria, UA: NONE SEEN
Bilirubin Urine: NEGATIVE
Glucose, UA: NEGATIVE mg/dL
KETONES UR: NEGATIVE mg/dL
Leukocytes, UA: NEGATIVE
Nitrite: NEGATIVE
PROTEIN: 100 mg/dL — AB
SPECIFIC GRAVITY, URINE: 1.019 (ref 1.005–1.030)
pH: 5 (ref 5.0–8.0)

## 2016-06-06 LAB — CBC WITH DIFFERENTIAL/PLATELET
Basophils Absolute: 0.1 10*3/uL (ref 0–0.1)
Basophils Relative: 1 %
Eosinophils Absolute: 0.5 10*3/uL (ref 0–0.7)
Eosinophils Relative: 5 %
HCT: 36.1 % (ref 35.0–47.0)
Hemoglobin: 12 g/dL (ref 12.0–16.0)
LYMPHS ABS: 1.9 10*3/uL (ref 1.0–3.6)
LYMPHS PCT: 17 %
MCH: 27.1 pg (ref 26.0–34.0)
MCHC: 33.2 g/dL (ref 32.0–36.0)
MCV: 81.7 fL (ref 80.0–100.0)
MONOS PCT: 8 %
Monocytes Absolute: 0.9 10*3/uL (ref 0.2–0.9)
NEUTROS PCT: 69 %
Neutro Abs: 7.6 10*3/uL — ABNORMAL HIGH (ref 1.4–6.5)
Platelets: 263 10*3/uL (ref 150–440)
RBC: 4.42 MIL/uL (ref 3.80–5.20)
RDW: 15.2 % — AB (ref 11.5–14.5)
WBC: 11 10*3/uL (ref 3.6–11.0)

## 2016-06-06 LAB — BASIC METABOLIC PANEL
Anion gap: 11 (ref 5–15)
BUN: 13 mg/dL (ref 6–20)
CHLORIDE: 102 mmol/L (ref 101–111)
CO2: 25 mmol/L (ref 22–32)
CREATININE: 0.87 mg/dL (ref 0.44–1.00)
Calcium: 9 mg/dL (ref 8.9–10.3)
GFR calc Af Amer: >60 mL/min (ref >60)
Glucose, Bld: 187 mg/dL — ABNORMAL HIGH (ref 65–99)
Potassium: 3.9 mmol/L (ref 3.5–5.1)
SODIUM: 138 mmol/L (ref 135–145)

## 2016-06-06 LAB — TROPONIN I: TROPONIN I: 0.55 ng/mL — AB (ref <0.03)
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
## 2016-06-07 ENCOUNTER — Inpatient Hospital Stay: Admit: 2016-06-07 | Payer: Medicare Other

## 2016-06-07 DIAGNOSIS — Z955 Presence of coronary angioplasty implant and graft: Secondary | ICD-10-CM | POA: Diagnosis not present

## 2016-06-07 DIAGNOSIS — R001 Bradycardia, unspecified: Secondary | ICD-10-CM

## 2016-06-07 DIAGNOSIS — Z7902 Long term (current) use of antithrombotics/antiplatelets: Secondary | ICD-10-CM | POA: Diagnosis not present

## 2016-06-07 DIAGNOSIS — R55 Syncope and collapse: Secondary | ICD-10-CM | POA: Diagnosis present

## 2016-06-07 DIAGNOSIS — E039 Hypothyroidism, unspecified: Secondary | ICD-10-CM | POA: Diagnosis present

## 2016-06-07 DIAGNOSIS — T447X5A Adverse effect of beta-adrenoreceptor antagonists, initial encounter: Secondary | ICD-10-CM | POA: Diagnosis present

## 2016-06-07 DIAGNOSIS — J449 Chronic obstructive pulmonary disease, unspecified: Secondary | ICD-10-CM | POA: Diagnosis present

## 2016-06-07 DIAGNOSIS — E119 Type 2 diabetes mellitus without complications: Secondary | ICD-10-CM | POA: Diagnosis present

## 2016-06-07 DIAGNOSIS — Z79899 Other long term (current) drug therapy: Secondary | ICD-10-CM | POA: Diagnosis not present

## 2016-06-07 DIAGNOSIS — I2510 Atherosclerotic heart disease of native coronary artery without angina pectoris: Secondary | ICD-10-CM | POA: Diagnosis present

## 2016-06-07 DIAGNOSIS — Z85828 Personal history of other malignant neoplasm of skin: Secondary | ICD-10-CM | POA: Diagnosis not present

## 2016-06-07 DIAGNOSIS — G4730 Sleep apnea, unspecified: Secondary | ICD-10-CM | POA: Diagnosis present

## 2016-06-07 DIAGNOSIS — I10 Essential (primary) hypertension: Secondary | ICD-10-CM | POA: Diagnosis present

## 2016-06-07 DIAGNOSIS — E7800 Pure hypercholesterolemia, unspecified: Secondary | ICD-10-CM | POA: Diagnosis present

## 2016-06-07 DIAGNOSIS — I252 Old myocardial infarction: Secondary | ICD-10-CM | POA: Diagnosis not present

## 2016-06-07 DIAGNOSIS — Z7982 Long term (current) use of aspirin: Secondary | ICD-10-CM | POA: Diagnosis not present

## 2016-06-07 DIAGNOSIS — Z794 Long term (current) use of insulin: Secondary | ICD-10-CM | POA: Diagnosis not present

## 2016-06-07 DIAGNOSIS — Z853 Personal history of malignant neoplasm of breast: Secondary | ICD-10-CM | POA: Diagnosis not present

## 2016-06-07 LAB — PROTIME-INR
INR: 1.11
Prothrombin Time: 14.3 seconds (ref 11.4–15.2)

## 2016-06-07 LAB — HEPARIN LEVEL (UNFRACTIONATED): HEPARIN UNFRACTIONATED: 0.1 [IU]/mL — AB (ref 0.30–0.70)

## 2016-06-07 LAB — GLUCOSE, CAPILLARY
GLUCOSE-CAPILLARY: 222 mg/dL — AB (ref 65–99)
GLUCOSE-CAPILLARY: 148 mg/dL — AB (ref 65–99)
GLUCOSE-CAPILLARY: 289 mg/dL — AB (ref 65–99)
GLUCOSE-CAPILLARY: 287 mg/dL — AB (ref 65–99)
Glucose-Capillary: 178 mg/dL — ABNORMAL HIGH (ref 65–99)
Glucose-Capillary: 280 mg/dL — ABNORMAL HIGH (ref 65–99)

## 2016-06-07 LAB — TROPONIN I
TROPONIN I: 0.51 ng/mL — AB (ref <0.03)
TROPONIN I: 0.48 ng/mL — AB (ref <0.03)
Troponin I: 0.51 ng/mL (ref <0.03)

## 2016-06-07 LAB — BASIC METABOLIC PANEL
ANION GAP: 6 (ref 5–15)
BUN: 13 mg/dL (ref 6–20)
CALCIUM: 8.7 mg/dL — AB (ref 8.9–10.3)
CO2: 28 mmol/L (ref 22–32)
Chloride: 105 mmol/L (ref 101–111)
Creatinine, Ser: 0.74 mg/dL (ref 0.44–1.00)
GFR calc Af Amer: >60 mL/min (ref >60)
Glucose, Bld: 154 mg/dL — ABNORMAL HIGH (ref 65–99)
POTASSIUM: 3.7 mmol/L (ref 3.5–5.1)
SODIUM: 139 mmol/L (ref 135–145)

## 2016-06-07 LAB — CBC
HEMATOCRIT: 36.3 % (ref 35.0–47.0)
HEMOGLOBIN: 11.8 g/dL — AB (ref 12.0–16.0)
MCH: 26.9 pg (ref 26.0–34.0)
MCHC: 32.6 g/dL (ref 32.0–36.0)
MCV: 82.4 fL (ref 80.0–100.0)
Platelets: 261 10*3/uL (ref 150–440)
RBC: 4.4 MIL/uL (ref 3.80–5.20)
RDW: 15 % — AB (ref 11.5–14.5)
WBC: 9.8 10*3/uL (ref 3.6–11.0)

## 2016-06-07 LAB — APTT: aPTT: 32 seconds (ref 24–36)

## 2016-06-07 MED ORDER — INSULIN ASPART 100 UNIT/ML ~~LOC~~ SOLN: 0.0000 [IU] | Freq: Three times a day (TID) | SUBCUTANEOUS | Status: DC

## 2016-06-07 MED ORDER — HEPARIN BOLUS VIA INFUSION
2000.0000 [IU] | Freq: Once | INTRAVENOUS | Status: AC
  Administered 2016-06-07: 2000 [IU] via INTRAVENOUS
  Filled 2016-06-07: qty 2000

## 2016-06-07 MED ORDER — INSULIN ASPART 100 UNIT/ML ~~LOC~~ SOLN
0.0000 [IU] | Freq: Three times a day (TID) | SUBCUTANEOUS | Status: DC
  Administered 2016-06-07 – 2016-06-08 (×2): 5 [IU] via SUBCUTANEOUS
  Filled 2016-06-07 (×2): qty 5

## 2016-06-07 MED ORDER — ACETAMINOPHEN 650 MG RE SUPP: 650.0000 mg | Freq: Four times a day (QID) | RECTAL | Status: DC | PRN

## 2016-06-07 MED ORDER — ROSUVASTATIN CALCIUM 10 MG PO TABS
10.0000 mg | ORAL_TABLET | Freq: Every day | ORAL | Status: DC
  Administered 2016-06-07 (×2): 10 mg via ORAL
  Filled 2016-06-07 (×2): qty 1

## 2016-06-07 MED ORDER — SODIUM CHLORIDE 0.9% FLUSH
3.0000 mL | Freq: Two times a day (BID) | INTRAVENOUS | Status: DC
  Administered 2016-06-07 – 2016-06-08 (×4): 3 mL via INTRAVENOUS

## 2016-06-07 MED ORDER — ACETAMINOPHEN 325 MG PO TABS
650.0000 mg | ORAL_TABLET | Freq: Four times a day (QID) | ORAL | Status: DC | PRN
  Administered 2016-06-07 – 2016-06-08 (×3): 650 mg via ORAL
  Filled 2016-06-07 (×3): qty 2

## 2016-06-07 MED ORDER — ONDANSETRON HCL 4 MG PO TABS: 4.0000 mg | ORAL_TABLET | Freq: Four times a day (QID) | ORAL | Status: DC | PRN

## 2016-06-07 MED ORDER — CITALOPRAM HYDROBROMIDE 20 MG PO TABS
40.0000 mg | ORAL_TABLET | Freq: Every day | ORAL | Status: DC
  Administered 2016-06-07 (×2): 40 mg via ORAL
  Filled 2016-06-07 (×2): qty 2

## 2016-06-07 MED ORDER — HEPARIN (PORCINE) IN NACL 100-0.45 UNIT/ML-% IJ SOLN
1150.0000 [IU]/h | INTRAMUSCULAR | Status: DC
  Administered 2016-06-07: 850 [IU]/h via INTRAVENOUS
  Administered 2016-06-07: 1150 [IU]/h via INTRAVENOUS
  Filled 2016-06-07: qty 250

## 2016-06-07 MED ORDER — LEVOTHYROXINE SODIUM 137 MCG PO TABS
137.0000 ug | ORAL_TABLET | ORAL | Status: DC
  Administered 2016-06-08: 137 ug via ORAL
  Filled 2016-06-07 (×2): qty 1

## 2016-06-07 MED ORDER — ASPIRIN EC 81 MG PO TBEC
81.0000 mg | DELAYED_RELEASE_TABLET | Freq: Every day | ORAL | Status: DC
  Administered 2016-06-07 – 2016-06-08 (×2): 81 mg via ORAL
  Filled 2016-06-07 (×2): qty 1

## 2016-06-07 MED ORDER — INSULIN ASPART 100 UNIT/ML ~~LOC~~ SOLN
0.0000 [IU] | Freq: Four times a day (QID) | SUBCUTANEOUS | Status: DC
  Administered 2016-06-07: 2 [IU] via SUBCUTANEOUS
  Administered 2016-06-07: 3 [IU] via SUBCUTANEOUS
  Administered 2016-06-07: 1 [IU] via SUBCUTANEOUS
  Filled 2016-06-07: qty 1
  Filled 2016-06-07: qty 2
  Filled 2016-06-07: qty 3

## 2016-06-07 MED ORDER — ROPINIROLE HCL 1 MG PO TABS
5.0000 mg | ORAL_TABLET | Freq: Every day | ORAL | Status: DC
  Administered 2016-06-07 (×2): 5 mg via ORAL
  Filled 2016-06-07 (×3): qty 5

## 2016-06-07 MED ORDER — CLOPIDOGREL BISULFATE 75 MG PO TABS
75.0000 mg | ORAL_TABLET | Freq: Every day | ORAL | Status: DC
Start: 2016-06-07 — End: 2016-06-08
  Administered 2016-06-07 – 2016-06-08 (×2): 75 mg via ORAL
  Filled 2016-06-07 (×2): qty 1

## 2016-06-07 MED ORDER — ONDANSETRON HCL 4 MG/2ML IJ SOLN: 4.0000 mg | Freq: Four times a day (QID) | INTRAMUSCULAR | Status: DC | PRN

## 2016-06-07 MED ORDER — ROPINIROLE HCL 1 MG PO TABS: 5.0000 mg | ORAL_TABLET | Freq: Every day | ORAL | Status: DC

## 2016-06-07 MED ORDER — METOPROLOL TARTRATE 25 MG PO TABS
25.0000 mg | ORAL_TABLET | Freq: Two times a day (BID) | ORAL | Status: DC
  Administered 2016-06-07: 25 mg via ORAL
  Filled 2016-06-07: qty 1

## 2016-06-07 MED ORDER — HEPARIN BOLUS VIA INFUSION
4000.0000 [IU] | Freq: Once | INTRAVENOUS | Status: AC
  Administered 2016-06-07: 4000 [IU] via INTRAVENOUS
  Filled 2016-06-07: qty 4000

## 2016-06-07 NOTE — Progress Notes (Signed)
ANTICOAGULATION CONSULT NOTE - Initial Consult  Pharmacy Consult for heparin drip Indication: ACS/STEMI  No Known Allergies  Patient Measurements: Height: 5\' 2"  (157.5 cm) Weight: 186 lb (84.4 kg) IBW/kg (Calculated) : 50.1 Heparin Dosing Weight: 69 kg  Vital Signs: Temp: 97.8 F (36.6 C) (10/05 1945) Temp Source: Oral (10/05 1945) BP: 153/57 (10/06 0057) Pulse Rate: 52 (10/05 2000)  Labs:  Recent Labs  06/06/16 1958 06/07/16 0006  HGB 12.0  --   HCT 36.1  --   PLT 263  --   APTT  --  32  LABPROT  --  14.3  INR  --  1.11  CREATININE 0.87  --   TROPONINI 0.55*  --     Estimated Creatinine Clearance: 51.1 mL/min (by C-G formula based on SCr of 0.87 mg/dL).   Medical History: Past Medical History:  Diagnosis Date  . Acquired hypothyroidism 01/06/2014  . Arthritis   . B12 deficiency 02/15/2014  . Benign essential hypertension 01/06/2014  . Breast cancer [facility]) 1982   Right breast cancer - chemotherapy  . CAD (coronary artery disease), native coronary artery 01/06/2014  . Carotid artery calcification   . Chronic airway obstruction, not elsewhere classified 01/06/2014  . Diabetes mellitus without complication ([place])   . Pure hypercholesterolemia 01/06/2014  . Skin cancer   . Sleep apnea     Medications:  No anticoagulation in PTA meds.  Assessment:  Goal of Therapy:  Heparin level 0.3-0.7 units/ml Monitor platelets by anticoagulation protocol: Yes   Plan:  4000 unit bolus and initial rate of 850 units/hr. First heparin level 8 hours after start of infusion.  Tadhg Eskew S 06/07/2016,1:39 AM
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
## 2016-06-07 NOTE — ED Notes (Signed)
Pt transported to Room 248
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
## 2016-06-07 NOTE — Progress Notes (Signed)
Nutrition Brief Note  Patient identified on the Malnutrition Screening Tool (MST) Report  Wt Readings from Last 15 Encounters:  06/06/16 186 lb (84.4 kg)  04/24/16 199 lb 14.4 oz (90.7 kg)  04/03/16 195 lb (88.5 kg)  12/19/14 197 lb (89.4 kg)    Body mass index is 34.02 kg/m. Patient meets criteria for obesity unspecified based on current BMI.   Current diet order is Carb Modified,pt reports good appetite at home and currently, eating food brought in by family on visit today. Pt reports intentional wt loss of 17 pounds by changing her dietary habits and increasing her physical activity. Labs and medications reviewed.   No nutrition interventions warranted at this time. If nutrition issues arise, please consult RD.   Kerman Passey [place], [place], LDN [phone] Pager  [phone] Weekend/On-Call Pager

## 2016-06-07 NOTE — Progress Notes (Signed)
ANTICOAGULATION CONSULT NOTE - follow up [facility] for heparin drip Indication: ACS/STEMI  No Known Allergies  Patient Measurements: Height: 5\' 2"  (157.5 cm) Weight: 186 lb (84.4 kg) IBW/kg (Calculated) : 50.1 Heparin Dosing Weight: 69 kg  Vital Signs: Temp: 97.8 F (36.6 C) (10/06 0828) Temp Source: Oral (10/06 0828) BP: 140/58 (10/06 1111) Pulse Rate: 52 (10/06 1111)  Labs:  Recent Labs  06/06/16 1958 06/07/16 0006 06/07/16 0114 06/07/16 0746 06/07/16 1118  HGB 12.0  --   --  11.8*  --   HCT 36.1  --   --  36.3  --   PLT 263  --   --  261  --   APTT  --  32  --   --   --   LABPROT  --  14.3  --   --   --   INR  --  1.11  --   --   --   HEPARINUNFRC  --   --   --   --  0.10*  CREATININE 0.87  --   --  0.74  --   TROPONINI 0.55*  --  0.51* 0.51*  --     Estimated Creatinine Clearance: 55.5 mL/min (by C-G formula based on SCr of 0.74 mg/dL).   Medical History: Past Medical History:  Diagnosis Date  . Acquired hypothyroidism 01/06/2014  . Arthritis   . B12 deficiency 02/15/2014  . Benign essential hypertension 01/06/2014  . Breast cancer [facility]) 1982   Right breast cancer - chemotherapy  . CAD (coronary artery disease), native coronary artery 01/06/2014  . Carotid artery calcification   . Chronic airway obstruction, not elsewhere classified 01/06/2014  . Diabetes mellitus without complication ([place])   . Pure hypercholesterolemia 01/06/2014  . Skin cancer   . Sleep apnea     Medications:  No anticoagulation in PTA meds.  Assessment:  Goal of Therapy:  Heparin level 0.3-0.7 units/ml Monitor platelets by anticoagulation protocol: Yes   Plan:  4000 unit bolus and initial rate of 850 units/hr. First heparin level 8 hours after start of infusion.  10/6: HL @ 1118= 0.10.  Will give 2000 units bolus and increase rate to 1150 units/hr. Will recheck in 8 hrs at 2030.    Aymar Whitfill A 06/07/2016,12:15 PM
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
## 2016-06-08 LAB — CBC
HCT: 35.1 % (ref 35.0–47.0)
HEMOGLOBIN: 12 g/dL (ref 12.0–16.0)
MCH: 27.9 pg (ref 26.0–34.0)
MCHC: 34.3 g/dL (ref 32.0–36.0)
MCV: 81.5 fL (ref 80.0–100.0)
Platelets: 240 10*3/uL (ref 150–440)
RBC: 4.31 MIL/uL (ref 3.80–5.20)
RDW: 15 % — AB (ref 11.5–14.5)
WBC: 8.3 10*3/uL (ref 3.6–11.0)

## 2016-06-08 LAB — HEMOGLOBIN A1C
HEMOGLOBIN A1C: 7.4 % — AB (ref 4.8–5.6)
MEAN PLASMA GLUCOSE: 166 mg/dL

## 2016-06-08 LAB — GLUCOSE, CAPILLARY
GLUCOSE-CAPILLARY: 195 mg/dL — AB (ref 65–99)
GLUCOSE-CAPILLARY: 252 mg/dL — AB (ref 65–99)

## 2016-06-08 MED ORDER — LISINOPRIL 5 MG PO TABS
2.5000 mg | ORAL_TABLET | Freq: Two times a day (BID) | ORAL | Status: DC
  Administered 2016-06-08: 2.5 mg via ORAL
  Filled 2016-06-08 (×2): qty 1

## 2016-06-08 MED ORDER — LISINOPRIL 5 MG PO TABS: 2.5000 mg | ORAL_TABLET | Freq: Two times a day (BID) | ORAL | Status: DC

## 2016-06-08 MED ORDER — LISINOPRIL 2.5 MG PO TABS: 2.5000 mg | ORAL_TABLET | Freq: Two times a day (BID) | ORAL | 0 refills | Status: DC

## 2016-06-08 MED ORDER — LISINOPRIL 5 MG PO TABS
2.5000 mg | ORAL_TABLET | Freq: Once | ORAL | Status: AC
Start: 2016-06-08 — End: 2016-06-08
  Administered 2016-06-08: 2.5 mg via ORAL
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
## 2016-06-08 NOTE — Progress Notes (Signed)
Cassandra [name]  SUBJECTIVE: Feels good. Anxious to go home.    Vitals:   06/07/16 1942 06/08/16 0534 06/08/16 0656 06/08/16 0834  BP: (!) 123/91 (!) 128/101 (!) 149/58 (!) 190/93  Pulse: 64 64 (!) 57 62  Resp: 18 18  18   Temp: 97.7 F (36.5 C) 97.8 F (36.6 C)    TempSrc: Oral Oral    SpO2: 94% 94%  93%  Weight:      Height:        Intake/Output Summary (Last 24 hours) at 06/08/16 0912 Last data filed at 06/07/16 2141  Gross per 24 hour  Intake                3 ml  Output                0 ml  Net                3 ml    LABS: Basic Metabolic Panel:  Recent Labs  06/06/16 1958 06/07/16 0746  NA 138 139  K 3.9 3.7  CL 102 105  CO2 25 28  GLUCOSE 187* 154*  BUN 13 13  CREATININE 0.87 0.74  CALCIUM 9.0 8.7*   Liver Function Tests: No results for input(s): AST, ALT, ALKPHOS, BILITOT, PROT, ALBUMIN in the last 72 hours. No results for input(s): LIPASE, AMYLASE in the last 72 hours. CBC:  Recent Labs  06/06/16 1958 06/07/16 0746 06/08/16 0350  WBC 11.0 9.8 8.3  NEUTROABS 7.6*  --   --   HGB 12.0 11.8* 12.0  HCT 36.1 36.3 35.1  MCV 81.7 82.4 81.5  PLT 263 261 240   Cardiac Enzymes:  Recent Labs  06/07/16 0114 06/07/16 0746 06/07/16 1519  TROPONINI 0.51* 0.51* 0.48*   BNP: Invalid input(s): POCBNP D-Dimer: No results for input(s): DDIMER in the last 72 hours. Hemoglobin A1C:  Recent Labs  06/07/16 0746  HGBA1C 7.4*   Fasting Lipid Panel: No results for input(s): CHOL, HDL, LDLCALC, TRIG, CHOLHDL, LDLDIRECT in the last 72 hours. Thyroid Function Tests: No results for input(s): TSH, T4TOTAL, T3FREE, THYROIDAB in the last 72 hours.  Invalid input(s): FREET3 Anemia Panel: No results for input(s): VITAMINB12, FOLATE, FERRITIN, TIBC, IRON, RETICCTPCT in the last 72 hours.   Physical Exam: Blood pressure (!) 190/93, pulse 62, temperature 97.8 F (36.6 C), temperature source Oral, resp. rate 18, height 5\' 2"   (1.575 m), weight 84.4 kg (186 lb), SpO2 93 %.   Wt Readings from Last 1 Encounters:  06/06/16 84.4 kg (186 lb)     General appearance: alert and cooperative Cardio: regular rate and rhythm GI: soft, non-tender; bowel sounds normal; no masses,  no organomegaly Extremities: extremities normal, atraumatic, no cyanosis or edema Neurologic: Grossly normal  TELEMETRY: Reviewed telemetry pt in nsr with occasional pvc. :  ASSESSMENT AND PLAN:  Active Problems:   Acquired hypothyroidism   CAD (coronary artery disease), native coronary artery-no evidence of acs. Continue with current meds.    Benign essential hypertension   Type II diabetes mellitus with manifestations ([place])   Syncope-appears to be secondary to bradycardia. [name] has resolved since stopping metoprolol. Does not appear to require ppm at this point. OK for discharge. Will follow up in the office with Holter. If she has significant tachycardia off of metoprolol will need to resume it with ppm back up.   Bradycardia, sinus-resloved     Teodoro Spray., MD, [facility] 06/08/2016 9:12 AM

## 2016-06-08 NOTE — Progress Notes (Signed)
Discharge instructions explained to pt and pts spouse/ verbalized an understanding/ iv and tele removed/ home med returned to pt/ transported off unit via wheelchair.

## 2016-06-08 NOTE — Progress Notes (Signed)
Dr. Posey Pronto made aware of high BP

## 2016-06-10 ENCOUNTER — Ambulatory Visit: Payer: Medicare Other | Admitting: Physical Therapy

## 2016-06-12 LAB — BLOOD GAS, VENOUS
Acid-Base Excess: 4.5 mmol/L — ABNORMAL HIGH (ref 0.0–2.0)
Bicarbonate: 30.8 mmol/L — ABNORMAL HIGH (ref 20.0–28.0)
PATIENT TEMPERATURE: 37
pCO2, Ven: 52 mmHg (ref 44.0–60.0)
pH, Ven: 7.38 (ref 7.250–7.430)

## 2016-06-14 ENCOUNTER — Inpatient Hospital Stay
Admission: EM | Admit: 2016-06-14 | Discharge: 2016-06-16 | DRG: 309 | Disposition: A | Discharge status: Home / Self Care | Payer: Medicare Other | Attending: Internal Medicine | Admitting: Internal Medicine

## 2016-06-14 ENCOUNTER — Emergency Department: Payer: Medicare Other

## 2016-06-14 DIAGNOSIS — R42 Dizziness and giddiness: Secondary | ICD-10-CM | POA: Diagnosis present

## 2016-06-14 DIAGNOSIS — M109 Gout, unspecified: Secondary | ICD-10-CM | POA: Diagnosis present

## 2016-06-14 DIAGNOSIS — Z9010 Acquired absence of unspecified breast and nipple: Secondary | ICD-10-CM

## 2016-06-14 DIAGNOSIS — I252 Old myocardial infarction: Secondary | ICD-10-CM

## 2016-06-14 DIAGNOSIS — E039 Hypothyroidism, unspecified: Secondary | ICD-10-CM | POA: Diagnosis present

## 2016-06-14 DIAGNOSIS — F329 Major depressive disorder, single episode, unspecified: Secondary | ICD-10-CM | POA: Diagnosis present

## 2016-06-14 DIAGNOSIS — I4892 Unspecified atrial flutter: Secondary | ICD-10-CM | POA: Diagnosis not present

## 2016-06-14 DIAGNOSIS — R001 Bradycardia, unspecified: Secondary | ICD-10-CM | POA: Diagnosis present

## 2016-06-14 DIAGNOSIS — Z853 Personal history of malignant neoplasm of breast: Secondary | ICD-10-CM | POA: Diagnosis not present

## 2016-06-14 DIAGNOSIS — Z85828 Personal history of other malignant neoplasm of skin: Secondary | ICD-10-CM | POA: Diagnosis not present

## 2016-06-14 DIAGNOSIS — E119 Type 2 diabetes mellitus without complications: Secondary | ICD-10-CM | POA: Diagnosis present

## 2016-06-14 DIAGNOSIS — Z9221 Personal history of antineoplastic chemotherapy: Secondary | ICD-10-CM | POA: Diagnosis not present

## 2016-06-14 DIAGNOSIS — R000 Tachycardia, unspecified: Secondary | ICD-10-CM | POA: Diagnosis not present

## 2016-06-14 DIAGNOSIS — J449 Chronic obstructive pulmonary disease, unspecified: Secondary | ICD-10-CM | POA: Diagnosis present

## 2016-06-14 DIAGNOSIS — N179 Acute kidney failure, unspecified: Secondary | ICD-10-CM | POA: Diagnosis present

## 2016-06-14 DIAGNOSIS — Z794 Long term (current) use of insulin: Secondary | ICD-10-CM | POA: Diagnosis not present

## 2016-06-14 DIAGNOSIS — G4730 Sleep apnea, unspecified: Secondary | ICD-10-CM | POA: Diagnosis present

## 2016-06-14 DIAGNOSIS — Z7982 Long term (current) use of aspirin: Secondary | ICD-10-CM

## 2016-06-14 DIAGNOSIS — R778 Other specified abnormalities of plasma proteins: Secondary | ICD-10-CM

## 2016-06-14 DIAGNOSIS — R7989 Other specified abnormal findings of blood chemistry: Secondary | ICD-10-CM

## 2016-06-14 DIAGNOSIS — E7800 Pure hypercholesterolemia, unspecified: Secondary | ICD-10-CM | POA: Diagnosis present

## 2016-06-14 DIAGNOSIS — I10 Essential (primary) hypertension: Secondary | ICD-10-CM | POA: Diagnosis present

## 2016-06-14 DIAGNOSIS — Z955 Presence of coronary angioplasty implant and graft: Secondary | ICD-10-CM

## 2016-06-14 DIAGNOSIS — I2510 Atherosclerotic heart disease of native coronary artery without angina pectoris: Secondary | ICD-10-CM | POA: Diagnosis present

## 2016-06-14 DIAGNOSIS — Z7902 Long term (current) use of antithrombotics/antiplatelets: Secondary | ICD-10-CM

## 2016-06-14 LAB — BASIC METABOLIC PANEL
ANION GAP: 12 (ref 5–15)
BUN: 40 mg/dL — ABNORMAL HIGH (ref 6–20)
CHLORIDE: 100 mmol/L — AB (ref 101–111)
CO2: 24 mmol/L (ref 22–32)
Calcium: 9.4 mg/dL (ref 8.9–10.3)
Creatinine, Ser: 1.41 mg/dL — ABNORMAL HIGH (ref 0.44–1.00)
GFR calc non Af Amer: 34 mL/min — ABNORMAL LOW (ref >60)
GFR, EST AFRICAN AMERICAN: 39 mL/min — AB (ref >60)
GLUCOSE: 174 mg/dL — AB (ref 65–99)
POTASSIUM: 4.3 mmol/L (ref 3.5–5.1)
Sodium: 136 mmol/L (ref 135–145)

## 2016-06-14 LAB — CBC WITH DIFFERENTIAL/PLATELET
Basophils Absolute: 0.1 10*3/uL (ref 0–0.1)
Basophils Relative: 1 %
Eosinophils Absolute: 0.5 10*3/uL (ref 0–0.7)
Eosinophils Relative: 4 %
HEMATOCRIT: 42.2 % (ref 35.0–47.0)
HEMOGLOBIN: 13.6 g/dL (ref 12.0–16.0)
LYMPHS ABS: 3.4 10*3/uL (ref 1.0–3.6)
Lymphocytes Relative: 28 %
MCH: 26.5 pg (ref 26.0–34.0)
MCHC: 32.1 g/dL (ref 32.0–36.0)
MCV: 82.5 fL (ref 80.0–100.0)
MONOS PCT: 9 %
Monocytes Absolute: 1.2 10*3/uL — ABNORMAL HIGH (ref 0.2–0.9)
NEUTROS ABS: 7.1 10*3/uL — AB (ref 1.4–6.5)
NEUTROS PCT: 58 %
Platelets: 324 10*3/uL (ref 150–440)
RBC: 5.12 MIL/uL (ref 3.80–5.20)
RDW: 15.5 % — ABNORMAL HIGH (ref 11.5–14.5)
WBC: 12.3 10*3/uL — AB (ref 3.6–11.0)

## 2016-06-14 LAB — TROPONIN I: Troponin I: 0.63 ng/mL (ref <0.03)

## 2016-06-14 MED ORDER — BISACODYL 5 MG PO TBEC: 5.0000 mg | DELAYED_RELEASE_TABLET | Freq: Every day | ORAL | Status: DC | PRN

## 2016-06-14 MED ORDER — ROPINIROLE HCL 1 MG PO TABS
5.0000 mg | ORAL_TABLET | Freq: Every day | ORAL | Status: DC
  Administered 2016-06-15 (×2): 5 mg via ORAL
  Filled 2016-06-14 (×2): qty 5

## 2016-06-14 MED ORDER — COLCHICINE 0.6 MG PO TABS
0.6000 mg | ORAL_TABLET | Freq: Every day | ORAL | Status: DC
  Administered 2016-06-15 – 2016-06-16 (×2): 0.6 mg via ORAL
  Filled 2016-06-14 (×2): qty 1

## 2016-06-14 MED ORDER — ACETAMINOPHEN 650 MG RE SUPP: 650.0000 mg | Freq: Four times a day (QID) | RECTAL | Status: DC | PRN

## 2016-06-14 MED ORDER — DILTIAZEM HCL 25 MG/5ML IV SOLN
10.0000 mg | Freq: Once | INTRAVENOUS | Status: AC
  Administered 2016-06-14: 10 mg via INTRAVENOUS
  Filled 2016-06-14: qty 5

## 2016-06-14 MED ORDER — ASPIRIN 81 MG PO CHEW
CHEWABLE_TABLET | ORAL | Status: AC
  Filled 2016-06-14: qty 4

## 2016-06-14 MED ORDER — ONDANSETRON HCL 4 MG/2ML IJ SOLN: 4.0000 mg | Freq: Four times a day (QID) | INTRAMUSCULAR | Status: DC | PRN

## 2016-06-14 MED ORDER — SODIUM CHLORIDE 0.9 % IV SOLN
INTRAVENOUS | Status: DC
  Administered 2016-06-15 – 2016-06-16 (×3): via INTRAVENOUS

## 2016-06-14 MED ORDER — MAGNESIUM CITRATE PO SOLN: 1.0000 | Freq: Once | ORAL | Status: DC | PRN

## 2016-06-14 MED ORDER — CLOPIDOGREL BISULFATE 75 MG PO TABS
75.0000 mg | ORAL_TABLET | Freq: Every day | ORAL | Status: DC
  Administered 2016-06-15 – 2016-06-16 (×2): 75 mg via ORAL
  Filled 2016-06-14 (×2): qty 1

## 2016-06-14 MED ORDER — SODIUM CHLORIDE 0.9% FLUSH
3.0000 mL | Freq: Two times a day (BID) | INTRAVENOUS | Status: DC
  Administered 2016-06-15 – 2016-06-16 (×4): 3 mL via INTRAVENOUS

## 2016-06-14 MED ORDER — DILTIAZEM HCL 25 MG/5ML IV SOLN: 10.0000 mg | Freq: Once | INTRAVENOUS | Status: DC

## 2016-06-14 MED ORDER — ADENOSINE 12 MG/4ML IV SOLN
INTRAVENOUS | Status: AC
  Filled 2016-06-14: qty 8

## 2016-06-14 MED ORDER — ASPIRIN EC 81 MG PO TBEC
81.0000 mg | DELAYED_RELEASE_TABLET | Freq: Every day | ORAL | Status: DC
  Administered 2016-06-15 – 2016-06-16 (×2): 81 mg via ORAL
  Filled 2016-06-14 (×2): qty 1

## 2016-06-14 MED ORDER — DEXTROSE 5 % IV SOLN: 5.0000 mg/h | INTRAVENOUS | Status: DC

## 2016-06-14 MED ORDER — ADENOSINE 6 MG/2ML IV SOLN
INTRAVENOUS | Status: AC
  Administered 2016-06-14: 6 mg via INTRAVENOUS
  Filled 2016-06-14: qty 2

## 2016-06-14 MED ORDER — HYDROCODONE-ACETAMINOPHEN 5-325 MG PO TABS: 1.0000 | ORAL_TABLET | ORAL | Status: DC | PRN

## 2016-06-14 MED ORDER — SODIUM CHLORIDE 0.9 % IV BOLUS (SEPSIS)
1000.0000 mL | Freq: Once | INTRAVENOUS | Status: AC
  Administered 2016-06-14: 1000 mL via INTRAVENOUS

## 2016-06-14 MED ORDER — CITALOPRAM HYDROBROMIDE 20 MG PO TABS
40.0000 mg | ORAL_TABLET | Freq: Every day | ORAL | Status: DC
  Administered 2016-06-15 (×2): 40 mg via ORAL
  Filled 2016-06-14 (×2): qty 2

## 2016-06-14 MED ORDER — SENNOSIDES-DOCUSATE SODIUM 8.6-50 MG PO TABS: 1.0000 | ORAL_TABLET | Freq: Every evening | ORAL | Status: DC | PRN

## 2016-06-14 MED ORDER — MECLIZINE HCL 25 MG PO TABS
25.0000 mg | ORAL_TABLET | ORAL | Status: DC
  Administered 2016-06-15: 25 mg via ORAL
  Filled 2016-06-14: qty 1

## 2016-06-14 MED ORDER — LEVOTHYROXINE SODIUM 137 MCG PO TABS
137.0000 ug | ORAL_TABLET | ORAL | Status: DC
  Administered 2016-06-15: 137 ug via ORAL
  Filled 2016-06-14: qty 1

## 2016-06-14 MED ORDER — INSULIN GLARGINE 100 UNIT/ML ~~LOC~~ SOLN
42.0000 [IU] | Freq: Every day | SUBCUTANEOUS | Status: DC
  Administered 2016-06-15 (×2): 42 [IU] via SUBCUTANEOUS
  Filled 2016-06-14 (×3): qty 0.42

## 2016-06-14 MED ORDER — ASPIRIN 81 MG PO CHEW
324.0000 mg | CHEWABLE_TABLET | Freq: Once | ORAL | Status: AC
  Administered 2016-06-14: 324 mg via ORAL

## 2016-06-14 MED ORDER — ADENOSINE 6 MG/2ML IV SOLN
6.0000 mg | Freq: Once | INTRAVENOUS | Status: AC
  Administered 2016-06-14: 6 mg via INTRAVENOUS

## 2016-06-14 MED ORDER — ZOLPIDEM TARTRATE 5 MG PO TABS: 5.0000 mg | ORAL_TABLET | Freq: Every evening | ORAL | Status: DC | PRN

## 2016-06-14 MED ORDER — INSULIN ASPART 100 UNIT/ML ~~LOC~~ SOLN
0.0000 [IU] | Freq: Three times a day (TID) | SUBCUTANEOUS | Status: DC
  Administered 2016-06-15 (×2): 3 [IU] via SUBCUTANEOUS
  Filled 2016-06-14 (×2): qty 3

## 2016-06-14 MED ORDER — ACETAMINOPHEN 325 MG PO TABS: 650.0000 mg | ORAL_TABLET | Freq: Four times a day (QID) | ORAL | Status: DC | PRN

## 2016-06-14 MED ORDER — ADENOSINE 12 MG/4ML IV SOLN: 12.0000 mg | Freq: Once | INTRAVENOUS | Status: DC

## 2016-06-14 MED ORDER — DEXTROSE 5 % IV SOLN
5.0000 mg/h | Freq: Once | INTRAVENOUS | Status: AC
  Administered 2016-06-14: 5 mg/h via INTRAVENOUS
  Filled 2016-06-14: qty 100

## 2016-06-14 MED ORDER — INSULIN ASPART 100 UNIT/ML ~~LOC~~ SOLN: 0.0000 [IU] | Freq: Every day | SUBCUTANEOUS | Status: DC

## 2016-06-14 MED ORDER — ROSUVASTATIN CALCIUM 10 MG PO TABS
10.0000 mg | ORAL_TABLET | Freq: Every day | ORAL | Status: DC
  Administered 2016-06-15 (×2): 10 mg via ORAL
  Filled 2016-06-14 (×2): qty 1

## 2016-06-14 MED ORDER — ENOXAPARIN SODIUM 40 MG/0.4ML ~~LOC~~ SOLN
40.0000 mg | SUBCUTANEOUS | Status: DC
  Administered 2016-06-15 (×2): 40 mg via SUBCUTANEOUS
  Filled 2016-06-14 (×2): qty 0.4

## 2016-06-14 MED ORDER — NITROGLYCERIN 0.4 MG SL SUBL: 0.4000 mg | SUBLINGUAL_TABLET | SUBLINGUAL | Status: DC | PRN

## 2016-06-14 MED ORDER — ONDANSETRON HCL 4 MG PO TABS: 4.0000 mg | ORAL_TABLET | Freq: Four times a day (QID) | ORAL | Status: DC | PRN

## 2016-06-14 MED ORDER — LISINOPRIL 5 MG PO TABS
2.5000 mg | ORAL_TABLET | Freq: Two times a day (BID) | ORAL | Status: DC
  Administered 2016-06-15 – 2016-06-16 (×3): 2.5 mg via ORAL
  Filled 2016-06-14 (×3): qty 1
  Filled 2016-06-14: qty 2

## 2016-06-14 NOTE — ED Provider Notes (Signed)
[facility] Emergency Department Provider Note ____________________________________________   I have reviewed the triage vital signs and the triage nursing note.  HISTORY  Chief Complaint Tachycardia   Historian Patient and spouse  HPI ASHARIA LOTTER is a 82 y.o. female presents today with what sounds like several hours of feeling flushed and dizzy and short of breath without specific chest pain and no true syncope. She states that she was in the hospital recently for a heart condition and taken off of Toprol.  Patient is a poor historian, but upon chart review it appears that she had symptomatic bradycardia and was taken off her Toprol. Patient states that she was told she might need a pacemaker.  She has a history of a heart attack in August with a stent placement. It sounds like she has a history of an nSTEMI recently.    Past Medical History:  Diagnosis Date  . Acquired hypothyroidism 01/06/2014  . Arthritis   . B12 deficiency 02/15/2014  . Benign essential hypertension 01/06/2014  . Breast cancer [facility]) 1982   Right breast cancer - chemotherapy  . CAD (coronary artery disease), native coronary artery 01/06/2014  . Carotid artery calcification   . Chronic airway obstruction, not elsewhere classified 01/06/2014  . Diabetes mellitus without complication ([place])   . Pure hypercholesterolemia 01/06/2014  . Skin cancer   . Sleep apnea     Patient Active Problem List   Diagnosis Date Noted  . Bradycardia, sinus 06/07/2016  . Syncope 06/06/2016  . Chest pain 04/21/2016  . Elevated troponin 04/21/2016  . Labile hypertension 04/21/2016  . Ischemic chest pain ([place]) 04/21/2016  . Long-term insulin use ([place]) 05/24/2014  . Mild vitamin D deficiency 05/20/2014  . B12 deficiency 02/15/2014  . Acquired hypothyroidism 01/06/2014  . Chronic airway obstruction, not elsewhere classified 01/06/2014  . CAD (coronary artery disease), native coronary artery 01/06/2014   . Benign essential hypertension 01/06/2014  . Headache 01/06/2014  . Hypersomnia with sleep apnea 01/06/2014  . Pure hypercholesterolemia 01/06/2014  . Type II diabetes mellitus with manifestations ([place]) 01/06/2014    Past Surgical History:  Procedure Laterality Date  . AUGMENTATION MAMMAPLASTY Bilateral 1982/redo in 2014  . CARDIAC CATHETERIZATION N/A 04/23/2016   Procedure: Left Heart Cath and Coronary Angiography;  Surgeon: Corey Skains, MD;  Location: [place] [facility];  Service: Cardiovascular;  Laterality: N/A;  . CARDIAC CATHETERIZATION Left 04/23/2016   Procedure: Coronary Stent Intervention;  Surgeon: Yolonda Kida, MD;  Location: [place] [facility];  Service: Cardiovascular;  Laterality: Left;  . CAROTID ARTERY ANGIOPLASTY    . CORONARY ANGIOPLASTY WITH STENT PLACEMENT  september 9th 2017  . MASTECTOMY Right 1982  . SKIN CANCER EXCISION      Prior to Admission medications   Medication Sig Start Date End Date Taking? Authorizing Provider  aspirin EC 81 MG EC tablet Take 1 tablet (81 mg total) by mouth daily. 04/22/16   Bettey Costa, MD  citalopram (CELEXA) 40 MG tablet TAKE ONE TABLET BY MOUTH EVERY NIGHT AT BEDTIME. 07/07/14   Historical Provider, MD  clopidogrel (PLAVIX) 75 MG tablet Take 1 tablet (75 mg total) by mouth daily with breakfast. 04/24/16   Bettey Costa, MD  colchicine 0.6 MG tablet Take 1 tablet (0.6 mg total) by mouth daily. 05/22/15   Max T Hyatt, DPM  HUMALOG KWIKPEN 100 UNIT/ML KiwkPen Inject 10-14 Units into the skin 3 (three) times daily before meals. *Use as directed per sliding scale* 03/03/15  Historical Provider, MD  insulin glargine (LANTUS) 100 UNIT/ML injection Inject 0.35 mLs (35 Units total) into the skin at bedtime. Patient taking differently: Inject 42 Units into the skin at bedtime.  04/22/16   Bettey Costa, MD  levothyroxine (SYNTHROID, LEVOTHROID) 137 MCG tablet Take 137 mcg by mouth every morning. 04/16/16   Historical Provider, MD   lisinopril (PRINIVIL,ZESTRIL) 2.5 MG tablet Take 1 tablet (2.5 mg total) by mouth 2 (two) times daily. 06/08/16   Fritzi Mandes, MD  meclizine (ANTIVERT) 25 MG tablet Take 25 mg by mouth every other day.  02/26/16   Historical Provider, MD  metFORMIN (GLUCOPHAGE-XR) 500 MG 24 hr tablet Take 2 tablets (1,000 mg total) by mouth 2 (two) times daily. Please start this on 8/24 04/24/16   Bettey Costa, MD  nitroGLYCERIN (NITROSTAT) 0.4 MG SL tablet Place 1 tablet (0.4 mg total) under the tongue every 5 (five) minutes as needed for chest pain. 04/24/16   Bettey Costa, MD  ropinirole (REQUIP) 5 MG tablet Take 5 mg by mouth at bedtime.  03/29/15   Historical Provider, MD  rosuvastatin (CRESTOR) 10 MG tablet Take 10 mg by mouth at bedtime.  03/25/16   Historical Provider, MD    No Known Allergies  Family History  Problem Relation Age of Onset  . Prostate cancer Neg Hx   . Kidney cancer Neg Hx     Social History Social History  Substance Use Topics  . Smoking status: Never Smoker  . Smokeless tobacco: Never Used  . Alcohol use No    Review of Systems  Constitutional: Negative for fever. Eyes: Negative for visual changes. ENT: Negative for sore throat. Cardiovascular: Negative for chest pain. Respiratory: Positive for shortness of breath. Gastrointestinal: Negative for abdominal pain, vomiting and diarrhea. Genitourinary: Negative for dysuria. Musculoskeletal: Negative for back pain. Skin: Negative for rash. Neurological: Negative for headache. 10 point Review of Systems otherwise negative ____________________________________________   PHYSICAL EXAM:  VITAL SIGNS: ED Triage Vitals  Enc Vitals Group     BP 06/14/16 1842 119/62     Pulse Rate 06/14/16 1842 (!) 156     Resp 06/14/16 1842 17     Temp 06/14/16 1842 97.3 F (36.3 C)     Temp Source 06/14/16 1842 Oral     SpO2 06/14/16 1842 95 %     Weight 06/14/16 1837 186 lb (84.4 kg)     Height 06/14/16 1837 5\' 2"  (1.575 m)     Head  Circumference --      Peak Flow --      Pain Score 06/14/16 1838 0     Pain Loc --      Pain Edu? --      Excl. in [place]? --      Constitutional: Alert and oriented. Slightly anxious HEENT   Head: Normocephalic and atraumatic.      Eyes: Conjunctivae are normal. PERRL. Normal extraocular movements.      Ears:         Nose: No congestion/rhinnorhea.   Mouth/Throat: Mucous membranes are moist.   Neck: No stridor. Cardiovascular/Chest: Tachycardic, regular.  No murmurs, rubs, or gallops. Respiratory: Normal respiratory effort without tachypnea nor retractions. Breath sounds are clear and equal bilaterally. No wheezes/rales/rhonchi. Gastrointestinal: Soft. No distention, no guarding, no rebound. Nontender.    Genitourinary/rectal:Deferred Musculoskeletal: Nontender with normal range of motion in all extremities. No joint effusions.  No lower extremity tenderness.  Trace lower extremity edema bilateral. No calf tenderness. Neurologic:  Normal speech  and language. No gross or focal neurologic deficits are appreciated. Skin:  Skin is warm, dry and intact. No rash noted. Psychiatric: Mood and affect are normal. Speech and behavior are normal. Patient exhibits appropriate insight and judgment.   ____________________________________________  LABS (pertinent positives/negatives)  Labs Reviewed  BASIC METABOLIC PANEL - Abnormal; Notable for the following:       Result Value   Chloride 100 (*)    Glucose, Bld 174 (*)    BUN 40 (*)    Creatinine, Ser 1.41 (*)    GFR calc non Af Amer 34 (*)    GFR calc Af Amer 39 (*)    All other components within normal limits  TROPONIN I - Abnormal; Notable for the following:    Troponin I 0.63 (*)    All other components within normal limits  CBC WITH DIFFERENTIAL/PLATELET - Abnormal; Notable for the following:    WBC 12.3 (*)    RDW 15.5 (*)    Neutro Abs 7.1 (*)    Monocytes Absolute 1.2 (*)    All other components within normal limits     ____________________________________________    EKG I, Lisa Roca, MD, the attending physician have personally viewed and interpreted all ECGs.  155 bpm. Narrow complex tachycardia. Left axis deviation. Nonspecific ST and T-wave  Repeat EKG. 117 atrial flutter. Left axis deviation. Nonspecific ST and T-wave ____________________________________________  RADIOLOGY All Xrays were viewed by me. Imaging interpreted by Radiologist.  Chest x-ray portable:IMPRESSION: Chronically increased interstitial markings noted. Lungs otherwise grossly clear. __________________________________________  PROCEDURES  Procedure(s) performed: None  Critical Care performed: CRITICAL CARE Performed by: Lisa Roca   Total critical care time: 30 minutes  Critical care time was exclusive of separately billable procedures and treating other patients.  Critical care was necessary to treat or prevent imminent or life-threatening deterioration.  Critical care was time spent personally by me on the following activities: development of treatment plan with patient and/or surrogate as well as nursing, discussions with consultants, evaluation of patient's response to treatment, examination of patient, obtaining history from patient or surrogate, ordering and performing treatments and interventions, ordering and review of laboratory studies, ordering and review of radiographic studies, pulse oximetry and re-evaluation of patient's condition.   ____________________________________________   ED COURSE / ASSESSMENT AND PLAN  Pertinent labs & imaging results that were available during my care of the patient were reviewed by me and considered in my medical decision making (see chart for details).   Patient had a narrow complex tachycardia, treated with adenosine, and underlying rhythm showed atrial flutter. She was then given 10 mg diltiazem which did bring her heart rate down into the 80s to 120s.  Patient was placed on a diltiazem drip.  Her troponin was 0.63. Her recent troponins have been between 0.4 and 0.6. She was given aspirin. I spoke with Dr. Saralyn Pilar (cardiology) and patient will be admitted to the hospitalist service.     CONSULTATIONS:   Cardiology Dr. Saralyn Pilar, and Hospitalist for admission.   Patient / Family / Caregiver informed of clinical course, medical decision-making process, and agree with plan.  ___________________________________________   FINAL CLINICAL IMPRESSION(S) / ED DIAGNOSES   Final diagnoses:  Acute renal failure, unspecified acute renal failure type ([place])  Atrial flutter with rapid ventricular response ([place])  Troponin I above reference range              Note: This dictation was prepared with Dragon dictation. Any transcriptional errors that result from this  process are unintentional    Lisa Roca, MD 06/14/16 2048

## 2016-06-14 NOTE — ED Notes (Signed)
cardiazem drip started at this time.

## 2016-06-14 NOTE — ED Notes (Signed)
Dr. Reita Cliche notified regarding pt's, blood pressure, hr and cardiazem drip. md states "let's hold the drip and see if her blood pressure comes up, we can start the drip if we need to if she doesn't convert." pt informed of holding drip at this time, ns and cardiazem drip at bedside if need for ns bolus and to start drip arises. Pt with pwd skin, appears in no acute distress.

## 2016-06-14 NOTE — ED Notes (Signed)
Cardiazem scanned in error, cardiazem not started at this time.

## 2016-06-14 NOTE — ED Triage Notes (Signed)
Pt here with c/o rapid HR.

## 2016-06-14 NOTE — ED Notes (Signed)
Report from [place], [place].

## 2016-06-14 NOTE — ED Notes (Signed)
Pt provided with meal tray and po fluids. Will continue to monitor hr. Pt updated on admission process.

## 2016-06-14 NOTE — ED Notes (Signed)
Critical troponin if 0.63 called from lab. Dr. Reita Cliche notified.

## 2016-06-15 LAB — CBC
HCT: 37.7 % (ref 35.0–47.0)
HEMATOCRIT: 34 % — AB (ref 35.0–47.0)
Hemoglobin: 11.6 g/dL — ABNORMAL LOW (ref 12.0–16.0)
Hemoglobin: 12.2 g/dL (ref 12.0–16.0)
MCH: 26.8 pg (ref 26.0–34.0)
MCH: 27.6 pg (ref 26.0–34.0)
MCHC: 32.5 g/dL (ref 32.0–36.0)
MCHC: 34.1 g/dL (ref 32.0–36.0)
MCV: 80.9 fL (ref 80.0–100.0)
MCV: 82.6 fL (ref 80.0–100.0)
PLATELETS: 234 10*3/uL (ref 150–440)
Platelets: 267 10*3/uL (ref 150–440)
RBC: 4.21 MIL/uL (ref 3.80–5.20)
RBC: 4.56 MIL/uL (ref 3.80–5.20)
RDW: 15.3 % — ABNORMAL HIGH (ref 11.5–14.5)
RDW: 15.3 % — ABNORMAL HIGH (ref 11.5–14.5)
WBC: 10.2 10*3/uL (ref 3.6–11.0)
WBC: 9.1 10*3/uL (ref 3.6–11.0)

## 2016-06-15 LAB — BASIC METABOLIC PANEL
Anion gap: 8 (ref 5–15)
BUN: 40 mg/dL — AB (ref 6–20)
CALCIUM: 8.5 mg/dL — AB (ref 8.9–10.3)
CHLORIDE: 104 mmol/L (ref 101–111)
CO2: 25 mmol/L (ref 22–32)
CREATININE: 1.44 mg/dL — AB (ref 0.44–1.00)
GFR, EST AFRICAN AMERICAN: 38 mL/min — AB (ref >60)
GFR, EST NON AFRICAN AMERICAN: 33 mL/min — AB (ref >60)
Glucose, Bld: 180 mg/dL — ABNORMAL HIGH (ref 65–99)
Potassium: 3.7 mmol/L (ref 3.5–5.1)
SODIUM: 137 mmol/L (ref 135–145)

## 2016-06-15 LAB — GLUCOSE, CAPILLARY
Glucose-Capillary: 127 mg/dL — ABNORMAL HIGH (ref 65–99)
Glucose-Capillary: 170 mg/dL — ABNORMAL HIGH (ref 65–99)
Glucose-Capillary: 184 mg/dL — ABNORMAL HIGH (ref 65–99)
Glucose-Capillary: 196 mg/dL — ABNORMAL HIGH (ref 65–99)
Glucose-Capillary: 87 mg/dL (ref 65–99)

## 2016-06-15 LAB — LIPID PANEL
CHOLESTEROL: 87 mg/dL (ref 0–200)
HDL: 29 mg/dL — AB (ref >40)
LDL Cholesterol: 28 mg/dL (ref 0–99)
TRIGLYCERIDES: 150 mg/dL — AB (ref <150)
Total CHOL/HDL Ratio: 3 RATIO
VLDL: 30 mg/dL (ref 0–40)

## 2016-06-15 LAB — TROPONIN I
TROPONIN I: 0.64 ng/mL — AB (ref <0.03)
Troponin I: 0.68 ng/mL (ref <0.03)
Troponin I: 0.71 ng/mL (ref <0.03)

## 2016-06-15 LAB — T4, FREE: Free T4: 1.4 ng/dL — ABNORMAL HIGH (ref 0.61–1.12)

## 2016-06-15 LAB — TSH: TSH: 0.715 u[IU]/mL (ref 0.350–4.500)

## 2016-06-15 LAB — MAGNESIUM: MAGNESIUM: 1.3 mg/dL — AB (ref 1.7–2.4)

## 2016-06-15 LAB — PHOSPHORUS: PHOSPHORUS: 4.6 mg/dL (ref 2.5–4.6)

## 2016-06-15 MED ORDER — DILTIAZEM HCL ER COATED BEADS 180 MG PO CP24
180.0000 mg | ORAL_CAPSULE | Freq: Every day | ORAL | Status: DC
  Administered 2016-06-15 – 2016-06-16 (×2): 180 mg via ORAL
  Filled 2016-06-15 (×2): qty 1

## 2016-06-15 MED ORDER — DILTIAZEM HCL 60 MG PO TABS
60.0000 mg | ORAL_TABLET | Freq: Three times a day (TID) | ORAL | Status: DC
  Administered 2016-06-15: 60 mg via ORAL
  Filled 2016-06-15: qty 1

## 2016-06-15 MED ORDER — LEVOTHYROXINE SODIUM 112 MCG PO TABS
112.0000 ug | ORAL_TABLET | Freq: Every day | ORAL | Status: DC
  Administered 2016-06-16: 112 ug via ORAL
  Filled 2016-06-15: qty 1

## 2016-06-15 NOTE — Progress Notes (Signed)
Patient arrived to 2A Room 260. Patient denies pain and all questions answered. Patient oriented to unit and Fall Safety Plan signed. Skin assessment completed with Suzzanne Cloud and skin intact. A&Ox4, VSS, and A-flutter on verified tele-box #[id number]. Cardizem drip infusing at 38mL/hr. Nursing staff will continue to monitor for any changes in patient status. Earleen Reaper, RN
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
## 2016-06-15 NOTE — Progress Notes (Signed)
Patient's HR 66-76 and BP 82/53-88/41 on Cardizem drip. Notified MD Hugelmeyer; MD said it was okay to take pt off of the drip and to monitor HR. Will notify MD if HR increases significantly. Earleen Reaper, RN

## 2016-06-15 NOTE — Progress Notes (Addendum)
[facility] NAME: Cassandra [name]    MR#:  [id number]  DATE OF BIRTH:  [birth year]  SUBJECTIVE:   Patient here withAtrial flutter. She denies palpitations, chest pain or shortness of breath. She had recent DES proximal LAD.  REVIEW OF SYSTEMS:    Review of Systems  Constitutional: Negative.  Negative for chills, fever and malaise/fatigue.  HENT: Negative.  Negative for ear discharge, ear pain, hearing loss, nosebleeds and sore throat.   Eyes: Negative.  Negative for blurred vision and pain.  Respiratory: Negative.  Negative for cough, hemoptysis, shortness of breath and wheezing.   Cardiovascular: Negative.  Negative for chest pain, palpitations and leg swelling.  Gastrointestinal: Negative.  Negative for abdominal pain, blood in stool, diarrhea, nausea and vomiting.  Genitourinary: Negative.  Negative for dysuria.  Musculoskeletal: Negative.  Negative for back pain.  Skin: Negative.   Neurological: Negative for dizziness, tremors, speech change, focal weakness, seizures and headaches.  Endo/Heme/Allergies: Negative.  Does not bruise/bleed easily.  Psychiatric/Behavioral: Negative.  Negative for depression, hallucinations and suicidal ideas.    Tolerating Diet:yes      DRUG ALLERGIES:  No Known Allergies  VITALS:  Blood pressure (!) 104/53, pulse 72, temperature 97.6 F (36.4 C), temperature source Oral, resp. rate 18, height 5\' 2"  (1.575 m), weight 83.5 kg (184 lb 1.6 oz), SpO2 93 %.  PHYSICAL EXAMINATION:   Physical Exam    LABORATORY PANEL:   CBC  Recent Labs Lab 06/15/16 0610  WBC 9.1  HGB 11.6*  HCT 34.0*  PLT 234   ------------------------------------------------------------------------------------------------------------------  Chemistries   Recent Labs Lab 06/15/16 0000  NA 137  K 3.7  CL 104  CO2 25  GLUCOSE 180*  BUN 40*  CREATININE 1.44*  CALCIUM 8.5*  MG 1.3*    ------------------------------------------------------------------------------------------------------------------  Cardiac Enzymes  Recent Labs Lab 06/14/16 1838 06/15/16 0000 06/15/16 0610  TROPONINI 0.63* 0.68* 0.64*   ------------------------------------------------------------------------------------------------------------------  RADIOLOGY:  Dg Chest Port 1 View  Result Date: 06/14/2016 CLINICAL DATA:  Acute onset of tachycardia.  Initial encounter. EXAM: PORTABLE CHEST 1 VIEW COMPARISON:  Chest radiograph performed 07/22/2013 FINDINGS: The lungs are relatively well expanded. Chronically increased interstitial markings are noted. No pleural effusion or pneumothorax is seen. The cardiomediastinal silhouette is borderline normal in size. No acute osseous abnormalities are seen. External pacing pads are noted. IMPRESSION: Chronically increased interstitial markings noted. Lungs otherwise grossly clear. Electronically Signed   By: Garald Balding M.D.   On: 06/14/2016 19:46     ASSESSMENT AND PLAN:   82 y/o female with recent DES proximal LAD 8/17 here with atrial flutter.   1. Atrial Flutter: Herat rate has improved Continue Cardizem to 180  Mg daily Follow up on ECHO. Appreciate cardiology consult Consider anticoagulation 2. Hypothyroidism: TSH is low. I will decrease Synthroid dose. Next line check T3 and T4. 3. CAD with recent DES to LAD: Continue diltiazem, aspirin, Plavix and statin.  4. Diabetes: Continue Lantus with sliding scale insulin and ADA diet.  5. Essential hypertension: Continue lisinopril and diltiazem. 6. Elevated troponin: This is due to demand ischemia and not ACS. Management plans discussed with the patient and she is in agreement.  CODE STATUS: full  TOTAL TIME TAKING CARE OF THIS PATIENT: 33 minutes.   Rounded the nursing staff  POSSIBLE D/C tomorrow, DEPENDING ON CLINICAL CONDITION.   Adil Tugwell M.D on 06/15/2016 at 10:49 AM  Between  7am to 6pm - Pager - [phone] After  6pm go to [email or web address] - password [email or web address] [facility]  Office  [phone]  CC: Primary care physician; Idelle Crouch, MD  Note: This dictation was prepared with Dragon dictation along with smaller phrase technology. Any transcriptional errors that result from this process are unintentional.
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
## 2016-06-15 NOTE — Progress Notes (Signed)
Patient reporting restless legs, asking to take ropinirole early, ahead of 2200 scheduled time. Per Dr. Benjie Karvonen, [place] to give early.

## 2016-06-15 NOTE — Plan of Care (Signed)
Problem: Activity: Goal: Ability to tolerate increased activity will improve Outcome: Progressing Patients HR remained controlled (70s-90s) while ambulating around the nurses station today. Remains A fib/flutter.

## 2016-06-16 ENCOUNTER — Inpatient Hospital Stay
Admit: 2016-06-16 | Discharge: 2016-06-16 | Disposition: A | Discharge status: Home / Self Care | Payer: Medicare Other | Attending: Cardiology | Admitting: Cardiology

## 2016-06-16 LAB — ECHOCARDIOGRAM COMPLETE
Height: 62 in
Weight: 2945.6 oz

## 2016-06-16 LAB — T3: T3 TOTAL: 104 ng/dL (ref 71–180)

## 2016-06-16 LAB — GLUCOSE, CAPILLARY: GLUCOSE-CAPILLARY: 99 mg/dL (ref 65–99)

## 2016-06-16 MED ORDER — APIXABAN 5 MG PO TABS: 5.0000 mg | ORAL_TABLET | Freq: Two times a day (BID) | ORAL | 0 refills | Status: DC

## 2016-06-16 MED ORDER — DILTIAZEM HCL ER COATED BEADS 180 MG PO CP24: 180.0000 mg | ORAL_CAPSULE | Freq: Every day | ORAL | 0 refills | Status: DC

## 2016-06-16 MED ORDER — LEVOTHYROXINE SODIUM 112 MCG PO TABS: 112.0000 ug | ORAL_TABLET | Freq: Every day | ORAL | 0 refills | Status: DC

## 2016-06-16 MED ORDER — APIXABAN 5 MG PO TABS
5.0000 mg | ORAL_TABLET | Freq: Two times a day (BID) | ORAL | Status: DC
  Administered 2016-06-16: 5 mg via ORAL
  Filled 2016-06-16: qty 1

## 2016-06-16 MED ORDER — INSULIN GLARGINE 100 UNIT/ML ~~LOC~~ SOLN
42.0000 [IU] | Freq: Every day | SUBCUTANEOUS | 0 refills | Status: DC
Start: 2016-06-16 — End: 2018-08-21

## 2016-06-16 NOTE — Discharge Summary (Signed)
[facility] [place] NAME: Cassandra [name]    MR#:  [id number]  DATE OF BIRTH:  25-Dec-1932  DATE OF ADMISSION:  06/14/2016 ADMITTING PHYSICIAN: Harvie Bridge, DO  DATE OF DISCHARGE: 06/16/2016 PRIMARY CARE PHYSICIAN: SPARKS,JEFFREY D, MD    ADMISSION DIAGNOSIS:  Atrial flutter with rapid ventricular response (HCC) [I48.92] Troponin I above reference range [R74.8] Acute renal failure, unspecified acute renal failure type ([place]) [N17.9]  DISCHARGE DIAGNOSIS:  Active Problems:   New onset atrial flutter ([place])   SECONDARY DIAGNOSIS:   Past Medical History:  Diagnosis Date  . Acquired hypothyroidism 01/06/2014  . Arthritis   . B12 deficiency 02/15/2014  . Benign essential hypertension 01/06/2014  . Breast cancer [facility]) 1982   Right breast cancer - chemotherapy  . CAD (coronary artery disease), native coronary artery 01/06/2014  . Carotid artery calcification   . Chronic airway obstruction, not elsewhere classified 01/06/2014  . Diabetes mellitus without complication ([place])   . Pure hypercholesterolemia 01/06/2014  . Skin cancer   . Sleep apnea     HOSPITAL COURSE:   82 y/o female with recent DES proximal LAD 8/17 here with atrial flutter.  1. Atrial Flutter: Her heart rate has improved. She was started on Cardizem to 180 mg daily. After discussing side effects, ulcers, risks and benefits of anticoagulation. Patient except service. Patient is started on Eliquis. Due to increased bleeding risks aspirin will be discontinued after her first dose of Arelia Sneddon was given. She will continue on Plavix due to recent cardiac stent placed She will have follow-up with Dr Kendall Flack next week.   2. Hypothyroidism: TSH and free T4 low. Synthroid dose was decreased. 3. CAD with recent DES to LAD: Continue diltiazem, Plavix and statin. Aspirin has been discontinued due to increased bleeding risks with Plavix and anticoagulation with eliquis.  4. Diabetes:  Continue Lantus with sliding scale insulin and ADA diet.  5. Essential hypertension: Continue lisinopril and diltiazem. 6. Elevated troponin: This is due to demand ischemia and not ACS.  DISCHARGE CONDITIONS AND DIET:   Stable for discharge on heart healthy/diabetic diet  CONSULTS OBTAINED:  Treatment Team:  Teodoro Spray, MD Isaias Cowman, MD  DRUG ALLERGIES:  No Known Allergies  DISCHARGE MEDICATIONS:   Current Discharge Medication List    START taking these medications   Details  apixaban (ELIQUIS) 5 MG TABS tablet Take 1 tablet (5 mg total) by mouth 2 (two) times daily. Qty: 60 tablet, Refills: 0    diltiazem (CARDIZEM CD) 180 MG 24 hr capsule Take 1 capsule (180 mg total) by mouth daily. Qty: 30 capsule, Refills: 0      CONTINUE these medications which have CHANGED   Details  insulin glargine (LANTUS) 100 UNIT/ML injection Inject 0.42 mLs (42 Units total) into the skin at bedtime. Qty: 0.42 mL, Refills: 0    levothyroxine (SYNTHROID, LEVOTHROID) 112 MCG tablet Take 1 tablet (112 mcg total) by mouth daily before breakfast. Qty: 30 tablet, Refills: 0      CONTINUE these medications which have NOT CHANGED   Details  citalopram (CELEXA) 40 MG tablet TAKE ONE TABLET BY MOUTH EVERY NIGHT AT BEDTIME.    clopidogrel (PLAVIX) 75 MG tablet Take 1 tablet (75 mg total) by mouth daily with breakfast. Qty: 30 tablet, Refills: 0    colchicine 0.6 MG tablet Take 1 tablet (0.6 mg total) by mouth daily. Qty: 60 tablet, Refills: 0    HUMALOG KWIKPEN 100 UNIT/ML KiwkPen Inject 10-14  Units into the skin 3 (three) times daily before meals. *Use as directed per sliding scale*    lisinopril (PRINIVIL,ZESTRIL) 2.5 MG tablet Take 1 tablet (2.5 mg total) by mouth 2 (two) times daily. Qty: 30 tablet, Refills: 0    meclizine (ANTIVERT) 25 MG tablet Take 25 mg by mouth every other day.     metFORMIN (GLUCOPHAGE-XR) 500 MG 24 hr tablet Take 2 tablets (1,000 mg total) by mouth 2  (two) times daily. Please start this on 8/24 Qty: 60 tablet, Refills: 0    nitroGLYCERIN (NITROSTAT) 0.4 MG SL tablet Place 1 tablet (0.4 mg total) under the tongue every 5 (five) minutes as needed for chest pain. Qty: 90 tablet, Refills: 0    ropinirole (REQUIP) 5 MG tablet Take 5 mg by mouth at bedtime.     rosuvastatin (CRESTOR) 10 MG tablet Take 10 mg by mouth at bedtime.       STOP taking these medications     aspirin EC 81 MG EC tablet               Today   CHIEF COMPLAINT:   Patient doing well this morning. She is hungry and eager to eat breakfast morning.   VITAL SIGNS:  Blood pressure (!) 150/61, pulse 69, temperature 97.6 F (36.4 C), temperature source Oral, resp. rate 18, height 5\' 2"  (1.575 m), weight 83.5 kg (184 lb 1.6 oz), SpO2 97 %.   REVIEW OF SYSTEMS:  Review of Systems  Constitutional: Negative.  Negative for chills, fever and malaise/fatigue.  HENT: Negative.  Negative for ear discharge, ear pain, hearing loss, nosebleeds and sore throat.   Eyes: Negative.  Negative for blurred vision and pain.  Respiratory: Negative.  Negative for cough, hemoptysis, shortness of breath and wheezing.   Cardiovascular: Negative.  Negative for chest pain, palpitations and leg swelling.  Gastrointestinal: Negative.  Negative for abdominal pain, blood in stool, diarrhea, nausea and vomiting.  Genitourinary: Negative.  Negative for dysuria.  Musculoskeletal: Negative.  Negative for back pain.  Skin: Negative.   Neurological: Negative for dizziness, tremors, speech change, focal weakness, seizures and headaches.  Endo/Heme/Allergies: Negative.  Does not bruise/bleed easily.  Psychiatric/Behavioral: Negative.  Negative for depression, hallucinations and suicidal ideas.     PHYSICAL EXAMINATION:  GENERAL:  82 y.o.-year-old patient lying in the bed with no acute distress.  NECK:  Supple, no jugular venous distention. No thyroid enlargement, no tenderness.  LUNGS:  Normal breath sounds bilaterally, no wheezing, rales,rhonchi  No use of accessory muscles of respiration.  CARDIOVASCULAR: ir,, irr nml HR. No murmurs, rubs, or gallops.  ABDOMEN: Soft, non-tender, non-distended. Bowel sounds present. No organomegaly or mass.  EXTREMITIES: No pedal edema, cyanosis, or clubbing.  PSYCHIATRIC: The patient is alert and oriented x 3.  SKIN: No obvious rash, lesion, or ulcer.   DATA REVIEW:   CBC  Recent Labs Lab 06/15/16 0610  WBC 9.1  HGB 11.6*  HCT 34.0*  PLT 234    Chemistries   Recent Labs Lab 06/15/16 0000  NA 137  K 3.7  CL 104  CO2 25  GLUCOSE 180*  BUN 40*  CREATININE 1.44*  CALCIUM 8.5*  MG 1.3*    Cardiac Enzymes  Recent Labs Lab 06/15/16 0000 06/15/16 0610 06/15/16 1149  TROPONINI 0.68* 0.64* 0.71*    Microbiology Results  @[email or web address] @  RADIOLOGY:  Dg Chest Port 1 View  Result Date: 06/14/2016 CLINICAL DATA:  Acute onset of tachycardia.  Initial encounter. EXAM: PORTABLE CHEST 1  VIEW COMPARISON:  Chest radiograph performed 07/22/2013 FINDINGS: The lungs are relatively well expanded. Chronically increased interstitial markings are noted. No pleural effusion or pneumothorax is seen. The cardiomediastinal silhouette is borderline normal in size. No acute osseous abnormalities are seen. External pacing pads are noted. IMPRESSION: Chronically increased interstitial markings noted. Lungs otherwise grossly clear. Electronically Signed   By: Garald Balding M.D.   On: 06/14/2016 19:46      Management plans discussed with the patient and she is in agreement. Stable for discharge home  Patient should follow up with cardiology Rounded with nursing  CODE STATUS:     Code Status Orders        Start     Ordered   06/14/16 2345  Full code  Continuous     06/14/16 2344    Code Status History    Date Active Date Inactive Code Status Order ID Comments User Context   06/07/2016  1:28 AM 06/08/2016  3:12 PM Full Code  [id number]  Lance Coon, MD Inpatient   04/23/2016  2:04 PM 04/24/2016  2:35 PM Full Code [id number]  Yolonda Kida, MD Inpatient   04/21/2016  8:18 PM 04/22/2016  8:59 AM Full Code [id number]  Idelle Crouch, MD Inpatient    Advance Directive Documentation   Flowsheet Row Most Recent Value  Type of Advance Directive  Healthcare Power of Attorney  Pre-existing out of facility DNR order (yellow form or pink MOST form)  No data  "MOST" Form in Place?  No data      TOTAL TIME TAKING CARE OF THIS PATIENT: 63 D/w dr Lorinda Creed minutes.    Note: This dictation was prepared with Dragon dictation along with smaller phrase technology. Any transcriptional errors that result from this process are unintentional.  Raea Magallon M.D on 06/16/2016 at 10:27 AM  Between 7am to 6pm - Pager - [phone] After 6pm go to [email or web address] - password [email or web address]  Office  [phone]  CC: Primary care physician; Idelle Crouch, MD

## 2016-06-16 NOTE — Progress Notes (Signed)
[facility] Cardiology  SUBJECTIVE: I feel better   Vitals:   06/15/16 1140 06/15/16 1718 06/15/16 2009 06/16/16 0419  BP: (!) 120/105 (!) 155/44 (!) 122/46 (!) 150/61  Pulse: 72 71 73 69  Resp: 16  18 18   Temp: 97.7 F (36.5 C)  97.8 F (36.6 C) 97.6 F (36.4 C)  TempSrc: Oral  Oral Oral  SpO2: 96%  96% 97%  Weight:      Height:         Intake/Output Summary (Last 24 hours) at 06/16/16 1040 Last data filed at 06/16/16 0932  Gross per 24 hour  Intake           1982.5 ml  Output             1500 ml  Net            482.5 ml      PHYSICAL EXAM  General: Well developed, well nourished, in no acute distress HEENT:  Normocephalic and atramatic Neck:  No JVD.  Lungs: Clear bilaterally to auscultation and percussion. Heart: HRRR . Normal S1 and S2 without gallops or murmurs.  Abdomen: Bowel sounds are positive, abdomen soft and non-tender  Msk:  Back normal, normal gait. Normal strength and tone for age. Extremities: No clubbing, cyanosis or edema.   Neuro: Alert and oriented X 3. Psych:  Good affect, responds appropriately   LABS: Basic Metabolic Panel:  Recent Labs  06/14/16 1838 06/15/16 0000  NA 136 137  K 4.3 3.7  CL 100* 104  CO2 24 25  GLUCOSE 174* 180*  BUN 40* 40*  CREATININE 1.41* 1.44*  CALCIUM 9.4 8.5*  MG  --  1.3*  PHOS  --  4.6   Liver Function Tests: No results for input(s): AST, ALT, ALKPHOS, BILITOT, PROT, ALBUMIN in the last 72 hours. No results for input(s): LIPASE, AMYLASE in the last 72 hours. CBC:  Recent Labs  06/14/16 1838 06/15/16 0000 06/15/16 0610  WBC 12.3* 10.2 9.1  NEUTROABS 7.1*  --   --   HGB 13.6 12.2 11.6*  HCT 42.2 37.7 34.0*  MCV 82.5 82.6 80.9  PLT 324 267 234   Cardiac Enzymes:  Recent Labs  06/15/16 0000 06/15/16 0610 06/15/16 1149  TROPONINI 0.68* 0.64* 0.71*   BNP: Invalid input(s): POCBNP D-Dimer: No results for input(s): DDIMER in the last 72 hours. Hemoglobin A1C: No results for input(s): HGBA1C  in the last 72 hours. Fasting Lipid Panel:  Recent Labs  06/15/16 0000  CHOL 87  HDL 29*  LDLCALC 28  TRIG 150*  CHOLHDL 3.0   Thyroid Function Tests:  Recent Labs  06/15/16 0000  TSH 0.715   Anemia Panel: No results for input(s): VITAMINB12, FOLATE, FERRITIN, TIBC, IRON, RETICCTPCT in the last 72 hours.  Dg Chest Port 1 View  Result Date: 06/14/2016 CLINICAL DATA:  Acute onset of tachycardia.  Initial encounter. EXAM: PORTABLE CHEST 1 VIEW COMPARISON:  Chest radiograph performed 07/22/2013 FINDINGS: The lungs are relatively well expanded. Chronically increased interstitial markings are noted. No pleural effusion or pneumothorax is seen. The cardiomediastinal silhouette is borderline normal in size. No acute osseous abnormalities are seen. External pacing pads are noted. IMPRESSION: Chronically increased interstitial markings noted. Lungs otherwise grossly clear. Electronically Signed   By: Garald Balding M.D.   On: 06/14/2016 19:46     Echo Pending  TELEMETRY: Atrial flutter:  ASSESSMENT AND PLAN:  Active Problems:   New onset atrial flutter ([place])    1. Atrial  flutter, of unknown duration, rate controlled 2. CAD, status post DES proximal LAD 04/23/2016 3. History of sinus bradycardia metoprolol  Recommendations  1. Agree with current therapy 2. Continue Cardizem CD 180 mg daily 3. Review 2-D echocardiogram 4. Start Eliquis 5 mg twice a day 5. Stop aspirin, continue Plavix   Isaias Cowman, MD, PhD, [facility] 06/16/2016 10:40 AM

## 2016-06-16 NOTE — Progress Notes (Signed)
Patient discharged via wheelchair and private vehicle. IV removed and catheter intact. All discharge instructions given and patient verbalizes understanding. Tele removed and returned. No prescriptions handed to patient No distress noted.

## 2016-06-24 ENCOUNTER — Encounter: Payer: Medicare Other | Admitting: Physical Therapy

## 2016-06-25 ENCOUNTER — Inpatient Hospital Stay: Admission: RE | Admit: 2016-06-25 | Payer: Medicare Other | Source: Ambulatory Visit

## 2016-06-26 ENCOUNTER — Ambulatory Visit: Payer: Medicare Other

## 2016-06-27 ENCOUNTER — Encounter
Admission: RE | Admit: 2016-06-27 | Discharge: 2016-06-27 | Disposition: A | Discharge status: Home / Self Care | Payer: Medicare Other | Source: Ambulatory Visit | Attending: Cardiology | Admitting: Cardiology

## 2016-06-27 DIAGNOSIS — R001 Bradycardia, unspecified: Secondary | ICD-10-CM | POA: Diagnosis not present

## 2016-06-27 DIAGNOSIS — Z01810 Encounter for preprocedural cardiovascular examination: Secondary | ICD-10-CM | POA: Diagnosis present

## 2016-06-27 DIAGNOSIS — Z01812 Encounter for preprocedural laboratory examination: Secondary | ICD-10-CM | POA: Diagnosis present

## 2016-06-27 DIAGNOSIS — Z01818 Encounter for other preprocedural examination: Secondary | ICD-10-CM

## 2016-06-27 LAB — BASIC METABOLIC PANEL
ANION GAP: 8 (ref 5–15)
BUN: 18 mg/dL (ref 6–20)
CALCIUM: 9.2 mg/dL (ref 8.9–10.3)
CO2: 28 mmol/L (ref 22–32)
Chloride: 102 mmol/L (ref 101–111)
Creatinine, Ser: 0.83 mg/dL (ref 0.44–1.00)
GLUCOSE: 152 mg/dL — AB (ref 65–99)
Potassium: 4.2 mmol/L (ref 3.5–5.1)
Sodium: 138 mmol/L (ref 135–145)

## 2016-06-27 LAB — DIFFERENTIAL
BASOS ABS: 0.1 10*3/uL (ref 0–0.1)
BASOS PCT: 1 %
EOS ABS: 0.5 10*3/uL (ref 0–0.7)
Eosinophils Relative: 5 %
Lymphocytes Relative: 24 %
Lymphs Abs: 2.4 10*3/uL (ref 1.0–3.6)
MONOS PCT: 9 %
Monocytes Absolute: 0.9 10*3/uL (ref 0.2–0.9)
NEUTROS PCT: 61 %
Neutro Abs: 6 10*3/uL (ref 1.4–6.5)

## 2016-06-27 LAB — APTT: APTT: 28 s (ref 24–36)

## 2016-06-27 LAB — CBC
HCT: 36.8 % (ref 35.0–47.0)
Hemoglobin: 12.4 g/dL (ref 12.0–16.0)
MCH: 27.2 pg (ref 26.0–34.0)
MCHC: 33.6 g/dL (ref 32.0–36.0)
MCV: 80.9 fL (ref 80.0–100.0)
Platelets: 236 10*3/uL (ref 150–440)
RBC: 4.55 MIL/uL (ref 3.80–5.20)
RDW: 15.3 % — AB (ref 11.5–14.5)
WBC: 9.9 10*3/uL (ref 3.6–11.0)

## 2016-06-27 LAB — PROTIME-INR
INR: 1.01
Prothrombin Time: 13.3 seconds (ref 11.4–15.2)

## 2016-06-27 LAB — SURGICAL PCR SCREEN
MRSA, PCR: NEGATIVE
STAPHYLOCOCCUS AUREUS: NEGATIVE

## 2016-06-27 NOTE — Patient Instructions (Signed)
Your procedure is scheduled on: Wednesday 07/03/16 Report to Day Surgery. 2ND FLOOR MEDICAL MALL ENTRANCE To find out your arrival time please call [phone] between 1PM - 3PM on Tuesday 07/02/16.  Remember: Instructions that are not followed completely may result in serious medical risk, up to and including death, or upon the discretion of your surgeon and anesthesiologist your surgery may need to be rescheduled.    __X__ 1. Do not eat food or drink liquids after midnight. No gum chewing or hard candies.     __X__ 2. No Alcohol/NO SMOKING for 24 hours before or after surgery.   __X__ 3. Bring all medications with you on the day of surgery if instructed.    __X__ 4. Notify your doctor if there is any change in your medical condition     (cold, fever, infections).     Do not wear jewelry, make-up, hairpins, clips or nail polish.  Do not wear lotions, powders, or perfumes.   Do not shave 48 hours prior to surgery. Men may shave face and neck.  Do not bring valuables to the hospital.    [facility] is not responsible for any belongings or valuables.               Contacts, dentures or bridgework may not be worn into surgery.  Leave your suitcase in the car. After surgery it may be brought to your room.  For patients admitted to the hospital, discharge time is determined by your                treatment team.   Patients discharged the day of surgery will not be allowed to drive home.   Please read over the following fact sheets that you were given:   Pain Booklet and MRSA Information   __X__ Take these medicines the morning of surgery with A SIP OF WATER:    1. DILTIAZEM  2. LEVOTHYROXINE  3.   4.  5.  6.  ____ Fleet Enema (as directed)   __X__ Use CHG Soap as directed  ____ Use inhalers on the day of surgery  __X__ Stop metformin 2 days prior to surgery    __X__ Take 1/2 of usual insulin dose the night before surgery and none on the morning of surgery.   __X__ Stop  Coumadin/Plavix/aspirin on AS INSTRUCTED. CONTACT CARDIOLOGY REGARDING ELOQUIS  ____ Stop Anti-inflammatories on    ____ Stop supplements until after surgery.    __X__ Bring C-Pap to the hospital.

## 2016-06-28 ENCOUNTER — Other Ambulatory Visit: Payer: Medicare Other

## 2016-07-01 ENCOUNTER — Encounter: Payer: Medicare Other | Admitting: Physical Therapy

## 2016-07-03 ENCOUNTER — Ambulatory Visit: Payer: Medicare Other | Admitting: Anesthesiology

## 2016-07-03 ENCOUNTER — Encounter
Admission: RE | Disposition: A | Discharge status: Home / Self Care | Payer: Self-pay | Source: Ambulatory Visit | Attending: Cardiology

## 2016-07-03 ENCOUNTER — Ambulatory Visit: Payer: Medicare Other

## 2016-07-03 ENCOUNTER — Encounter: Payer: Self-pay | Admitting: *Deleted

## 2016-07-03 ENCOUNTER — Observation Stay
Admission: RE | Admit: 2016-07-03 | Discharge: 2016-07-04 | Disposition: A | Discharge status: Home / Self Care | Payer: Medicare Other | Source: Ambulatory Visit | Attending: Cardiology | Admitting: Cardiology

## 2016-07-03 ENCOUNTER — Observation Stay: Payer: Medicare Other

## 2016-07-03 DIAGNOSIS — Z7901 Long term (current) use of anticoagulants: Secondary | ICD-10-CM | POA: Diagnosis not present

## 2016-07-03 DIAGNOSIS — I10 Essential (primary) hypertension: Secondary | ICD-10-CM | POA: Insufficient documentation

## 2016-07-03 DIAGNOSIS — Z8249 Family history of ischemic heart disease and other diseases of the circulatory system: Secondary | ICD-10-CM | POA: Diagnosis not present

## 2016-07-03 DIAGNOSIS — Z794 Long term (current) use of insulin: Secondary | ICD-10-CM | POA: Insufficient documentation

## 2016-07-03 DIAGNOSIS — M1990 Unspecified osteoarthritis, unspecified site: Secondary | ICD-10-CM | POA: Insufficient documentation

## 2016-07-03 DIAGNOSIS — Z833 Family history of diabetes mellitus: Secondary | ICD-10-CM | POA: Diagnosis not present

## 2016-07-03 DIAGNOSIS — Z7982 Long term (current) use of aspirin: Secondary | ICD-10-CM | POA: Insufficient documentation

## 2016-07-03 DIAGNOSIS — Z853 Personal history of malignant neoplasm of breast: Secondary | ICD-10-CM | POA: Insufficient documentation

## 2016-07-03 DIAGNOSIS — M109 Gout, unspecified: Secondary | ICD-10-CM | POA: Insufficient documentation

## 2016-07-03 DIAGNOSIS — I700 Atherosclerosis of aorta: Secondary | ICD-10-CM | POA: Insufficient documentation

## 2016-07-03 DIAGNOSIS — I2510 Atherosclerotic heart disease of native coronary artery without angina pectoris: Secondary | ICD-10-CM | POA: Diagnosis not present

## 2016-07-03 DIAGNOSIS — Z950 Presence of cardiac pacemaker: Secondary | ICD-10-CM

## 2016-07-03 DIAGNOSIS — I495 Sick sinus syndrome: Secondary | ICD-10-CM | POA: Diagnosis not present

## 2016-07-03 DIAGNOSIS — Z87891 Personal history of nicotine dependence: Secondary | ICD-10-CM | POA: Insufficient documentation

## 2016-07-03 DIAGNOSIS — G4730 Sleep apnea, unspecified: Secondary | ICD-10-CM | POA: Insufficient documentation

## 2016-07-03 DIAGNOSIS — E7800 Pure hypercholesterolemia, unspecified: Secondary | ICD-10-CM | POA: Insufficient documentation

## 2016-07-03 DIAGNOSIS — E1140 Type 2 diabetes mellitus with diabetic neuropathy, unspecified: Secondary | ICD-10-CM | POA: Diagnosis not present

## 2016-07-03 DIAGNOSIS — I4891 Unspecified atrial fibrillation: Secondary | ICD-10-CM | POA: Insufficient documentation

## 2016-07-03 DIAGNOSIS — R55 Syncope and collapse: Secondary | ICD-10-CM | POA: Diagnosis not present

## 2016-07-03 DIAGNOSIS — Z7902 Long term (current) use of antithrombotics/antiplatelets: Secondary | ICD-10-CM | POA: Diagnosis not present

## 2016-07-03 DIAGNOSIS — I252 Old myocardial infarction: Secondary | ICD-10-CM | POA: Diagnosis not present

## 2016-07-03 DIAGNOSIS — J449 Chronic obstructive pulmonary disease, unspecified: Secondary | ICD-10-CM | POA: Insufficient documentation

## 2016-07-03 HISTORY — DX: Presence of cardiac pacemaker: Z95.0

## 2016-07-03 HISTORY — PX: PACEMAKER INSERTION: SHX728

## 2016-07-03 LAB — GLUCOSE, CAPILLARY
GLUCOSE-CAPILLARY: 159 mg/dL — AB (ref 65–99)
GLUCOSE-CAPILLARY: 213 mg/dL — AB (ref 65–99)
Glucose-Capillary: 212 mg/dL — ABNORMAL HIGH (ref 65–99)
Glucose-Capillary: 191 mg/dL — ABNORMAL HIGH (ref 65–99)
Glucose-Capillary: 144 mg/dL — ABNORMAL HIGH (ref 65–99)

## 2016-07-03 SURGERY — INSERTION, CARDIAC PACEMAKER
Anesthesia: Monitor Anesthesia Care | Site: Chest | Laterality: Left | Wound class: Clean

## 2016-07-03 MED ORDER — ACETAMINOPHEN 325 MG PO TABS: 325.0000 mg | ORAL_TABLET | ORAL | Status: DC | PRN

## 2016-07-03 MED ORDER — SODIUM CHLORIDE 0.9 % IV SOLN
INTRAVENOUS | Status: DC | PRN
  Administered 2016-07-03: 14:00:00 via INTRAVENOUS

## 2016-07-03 MED ORDER — SODIUM CHLORIDE 0.9 % IV SOLN
INTRAVENOUS | Status: DC
  Administered 2016-07-03 (×2): via INTRAVENOUS

## 2016-07-03 MED ORDER — LIDOCAINE HCL (CARDIAC) 20 MG/ML IV SOLN
INTRAVENOUS | Status: DC | PRN
  Administered 2016-07-03: 40 mg via INTRAVENOUS

## 2016-07-03 MED ORDER — INSULIN NPH (HUMAN) (ISOPHANE) 100 UNIT/ML ~~LOC~~ SUSP: 12.0000 [IU] | Freq: Three times a day (TID) | SUBCUTANEOUS | Status: DC

## 2016-07-03 MED ORDER — CEFAZOLIN IN D5W 1 GM/50ML IV SOLN
INTRAVENOUS | Status: AC
  Filled 2016-07-03: qty 50

## 2016-07-03 MED ORDER — CEFAZOLIN IN D5W 1 GM/50ML IV SOLN
1.0000 g | Freq: Once | INTRAVENOUS | Status: AC
  Administered 2016-07-03: 1 g via INTRAVENOUS

## 2016-07-03 MED ORDER — CITALOPRAM HYDROBROMIDE 20 MG PO TABS
40.0000 mg | ORAL_TABLET | Freq: Every day | ORAL | Status: DC
  Administered 2016-07-03 – 2016-07-04 (×2): 40 mg via ORAL
  Filled 2016-07-03: qty 2
  Filled 2016-07-03: qty 1
  Filled 2016-07-03: qty 2

## 2016-07-03 MED ORDER — SODIUM CHLORIDE 0.9 % IR SOLN
Status: DC | PRN
  Administered 2016-07-03: 150 mL

## 2016-07-03 MED ORDER — ROSUVASTATIN CALCIUM 10 MG PO TABS
10.0000 mg | ORAL_TABLET | Freq: Every day | ORAL | Status: DC
  Administered 2016-07-03: 10 mg via ORAL
  Filled 2016-07-03: qty 1

## 2016-07-03 MED ORDER — ROPINIROLE HCL 0.25 MG PO TABS
0.5000 mg | ORAL_TABLET | Freq: Every day | ORAL | Status: DC
  Administered 2016-07-03: 0.5 mg via ORAL
  Filled 2016-07-03: qty 1

## 2016-07-03 MED ORDER — IOPAMIDOL (ISOVUE-300) INJECTION 61%
INTRAVENOUS | Status: DC | PRN
  Administered 2016-07-03: 30 mL

## 2016-07-03 MED ORDER — PROPOFOL 10 MG/ML IV BOLUS
INTRAVENOUS | Status: DC | PRN
  Administered 2016-07-03: 10 mg via INTRAVENOUS
  Administered 2016-07-03: 20 mg via INTRAVENOUS

## 2016-07-03 MED ORDER — INSULIN ASPART 100 UNIT/ML ~~LOC~~ SOLN
12.0000 [IU] | Freq: Two times a day (BID) | SUBCUTANEOUS | Status: DC
  Administered 2016-07-03 – 2016-07-04 (×2): 12 [IU] via SUBCUTANEOUS
  Filled 2016-07-03 (×2): qty 12

## 2016-07-03 MED ORDER — SODIUM CHLORIDE 0.9 % IR SOLN
Freq: Once | Status: DC
  Filled 2016-07-03 (×2): qty 2

## 2016-07-03 MED ORDER — FAMOTIDINE 20 MG PO TABS
ORAL_TABLET | ORAL | Status: AC
  Filled 2016-07-03: qty 1

## 2016-07-03 MED ORDER — PROPOFOL 500 MG/50ML IV EMUL
INTRAVENOUS | Status: DC | PRN
  Administered 2016-07-03: 50 ug/kg/min via INTRAVENOUS

## 2016-07-03 MED ORDER — FENTANYL CITRATE (PF) 100 MCG/2ML IJ SOLN
INTRAMUSCULAR | Status: DC | PRN
  Administered 2016-07-03: 50 ug via INTRAVENOUS

## 2016-07-03 MED ORDER — INSULIN GLARGINE 100 UNIT/ML ~~LOC~~ SOLN
36.0000 [IU] | Freq: Every day | SUBCUTANEOUS | Status: DC
  Administered 2016-07-03: 36 [IU] via SUBCUTANEOUS
  Filled 2016-07-03 (×2): qty 0.36

## 2016-07-03 MED ORDER — FENTANYL CITRATE (PF) 100 MCG/2ML IJ SOLN: 25.0000 ug | INTRAMUSCULAR | Status: DC | PRN

## 2016-07-03 MED ORDER — DILTIAZEM HCL ER COATED BEADS 120 MG PO CP24
120.0000 mg | ORAL_CAPSULE | Freq: Every day | ORAL | Status: DC
  Administered 2016-07-04: 120 mg via ORAL
  Filled 2016-07-03: qty 1

## 2016-07-03 MED ORDER — LEVOTHYROXINE SODIUM 112 MCG PO TABS
112.0000 ug | ORAL_TABLET | Freq: Every day | ORAL | Status: DC
  Administered 2016-07-04: 112 ug via ORAL
  Filled 2016-07-03: qty 1

## 2016-07-03 MED ORDER — MIDAZOLAM HCL 2 MG/2ML IJ SOLN
INTRAMUSCULAR | Status: DC | PRN
  Administered 2016-07-03: 0.5 mg via INTRAVENOUS

## 2016-07-03 MED ORDER — FAMOTIDINE 20 MG PO TABS
20.0000 mg | ORAL_TABLET | Freq: Once | ORAL | Status: AC
  Administered 2016-07-03: 20 mg via ORAL

## 2016-07-03 MED ORDER — ONDANSETRON HCL 4 MG/2ML IJ SOLN: 4.0000 mg | Freq: Four times a day (QID) | INTRAMUSCULAR | Status: DC | PRN

## 2016-07-03 MED ORDER — CEFAZOLIN IN D5W 1 GM/50ML IV SOLN
1.0000 g | Freq: Four times a day (QID) | INTRAVENOUS | Status: DC
  Administered 2016-07-03 – 2016-07-04 (×2): 1 g via INTRAVENOUS
  Filled 2016-07-03 (×3): qty 50

## 2016-07-03 MED ORDER — ONDANSETRON HCL 4 MG/2ML IJ SOLN: 4.0000 mg | Freq: Once | INTRAMUSCULAR | Status: DC | PRN

## 2016-07-03 SURGICAL SUPPLY — 39 items
BAG DECANTER FOR FLEXI CONT (MISCELLANEOUS) ×3 IMPLANT
BENZOIN TINCTURE PRP APPL 2/3 (GAUZE/BANDAGES/DRESSINGS) ×3 IMPLANT
BLADE PHOTON ILLUMINATED (MISCELLANEOUS) ×3 IMPLANT
BRUSH SCRUB 4% CHG (MISCELLANEOUS) ×3 IMPLANT
CABLE SURG 12 DISP A/V CHANNEL (MISCELLANEOUS) ×3 IMPLANT
CANISTER SUCT 1200ML W/VALVE (MISCELLANEOUS) ×3 IMPLANT
CHLORAPREP W/TINT 26ML (MISCELLANEOUS) ×3 IMPLANT
CLOSURE WOUND 1/2 X4 (GAUZE/BANDAGES/DRESSINGS) ×1
COVER LIGHT HANDLE STERIS (MISCELLANEOUS) ×6 IMPLANT
COVER MAYO STAND STRL (DRAPES) ×3 IMPLANT
DEVICE DISSECT PLASMABLAD 3.0S (MISCELLANEOUS) IMPLANT
DRAPE C-ARM XRAY 36X54 (DRAPES) ×3 IMPLANT
DRESSING TELFA 4X3 1S ST N-ADH (GAUZE/BANDAGES/DRESSINGS) ×3 IMPLANT
DRSG TEGADERM 4X4.75 (GAUZE/BANDAGES/DRESSINGS) ×3 IMPLANT
ELECT REM PT RETURN 9FT ADLT (ELECTROSURGICAL) ×3
ELECTRODE REM PT RTRN 9FT ADLT (ELECTROSURGICAL) ×1 IMPLANT
GLOVE BIO SURGEON STRL SZ7.5 (GLOVE) ×3 IMPLANT
GLOVE BIO SURGEON STRL SZ8 (GLOVE) ×3 IMPLANT
GOWN STRL REUS W/ TWL LRG LVL3 (GOWN DISPOSABLE) ×1 IMPLANT
GOWN STRL REUS W/ TWL XL LVL3 (GOWN DISPOSABLE) ×1 IMPLANT
GOWN STRL REUS W/TWL LRG LVL3 (GOWN DISPOSABLE) ×2
GOWN STRL REUS W/TWL XL LVL3 (GOWN DISPOSABLE) ×2
IMMOBILIZER SHDR MD LX WHT (SOFTGOODS) IMPLANT
IMMOBILIZER SHDR XL LX WHT (SOFTGOODS) IMPLANT
INTRO PACEMKR SHEATH II 7FR (MISCELLANEOUS) ×3
INTRODUCER PACEMKR SHTH II 7FR (MISCELLANEOUS) ×1 IMPLANT
IV NS 500ML (IV SOLUTION) ×2
IV NS 500ML BAXH (IV SOLUTION) ×1 IMPLANT
KIT RM TURNOVER STRD PROC AR (KITS) ×3 IMPLANT
LABEL OR SOLS (LABEL) ×3 IMPLANT
LEAD CAPSURE NOVUS 5076-52CM (Lead) ×3 IMPLANT
MARKER SKIN DUAL TIP RULER LAB (MISCELLANEOUS) ×3 IMPLANT
PACEMAKER LEAD ATRL (Lead) ×3 IMPLANT
PACK PACE INSERTION (MISCELLANEOUS) ×3 IMPLANT
PAD STATPAD (MISCELLANEOUS) ×3 IMPLANT
PLASMABLADE 3.0S (MISCELLANEOUS)
PPM ADVISA MRI DR A2DR01 (Pacemaker) ×3 IMPLANT
STRIP CLOSURE SKIN 1/2X4 (GAUZE/BANDAGES/DRESSINGS) ×2 IMPLANT
SUT SILK 0 SH 30 (SUTURE) ×9 IMPLANT

## 2016-07-03 NOTE — OR Nursing (Signed)
Belongings taken to PACU for transport with pt to inpt room at pt request (clothing/shoes/partial&denture/overnight bag)

## 2016-07-03 NOTE — Op Note (Signed)
[facility] Cardiology   07/03/2016                     3:17 PM  PATIENT:  Tamsen Roers [place]    PRE-OPERATIVE DIAGNOSIS:  sss  POST-OPERATIVE DIAGNOSIS:  Same  PROCEDURE:  INSERTION PACEMAKER  SURGEON:  Isaias Cowman, MD    ANESTHESIA:     PREOPERATIVE INDICATIONS:  AQSA SENSABAUGH is a  82 y.o. female with a diagnosis of sss who failed conservative measures and elected for surgical management.    The risks benefits and alternatives were discussed with the patient preoperatively including but not limited to the risks of infection, bleeding, cardiopulmonary complications, the need for revision surgery, among others, and the patient was willing to proceed.   OPERATIVE PROCEDURE: The patient was brought to the operating room in fasting state. The left pectoral region was prepped and draped in the usual sterile manner. Anesthesia was obtained 1% lidocaine locally. A 6 cm incision was performed along the pectoral region. The pocket was generated by electrocautery and blunt dissection. Access was obtained for left subclavian vein by fine-needle aspiration. MRI compatible leads were positioned in the right ventricular apical septum and right atrial appendage under fluoroscopic guidance. After proper thresholds were obtained the leads were sutured in place. The pacemaker pocket was irrigated gentleman solution. The leads were connected with MRI compatible dual chamber rate responsive pacemaker generator ([employer]onic Advias DR MR [id number]). Pacemaker generator disposition to the pocket and the pocket was closed with 2-0 and 4-0 Vicryl, respectively. Steri-Strips and pressure dressing were applied.

## 2016-07-03 NOTE — H&P (Signed)
<6>42349-1<7>Reason for Visit<6>29299-5<7>Encounter Details<6>46240-8<7>Social History<6>29762-2<7>Last Filed Vital Signs<6>8716-3<7>Progress Notes<6>10164-2<7>Plan of Treatment<6>18776-5<7>Miscellaneous Results<6>59776-5<7>Visit Diagnoses<6>51848-0<7>Discontinued Medications<6>X-DCMED<7>Historical Medications<6>X-HISTMED<7>Orders<6>77597-3<7>/"> Jump to Section ? Discontinued MedicationsDocument InformationEncounter DetailsHistorical MedicationsLast Filed Vital SignsMiscellaneous ResultsOrdersPatient DemographicsPlan of TreatmentProgress NotesReason for [employer] for VisitSocial HistoryVisit Diagnoses [employer], generated on Nov. 01, 2017 Printout Information  Document Contents Office Visit Document Received Date Nov. 01, 2017 [place]   Patient Demographics - 82 y.o. Female, born 09-Apr-1933   Patient Address Communication Language Race / Ethnicity  [address] [place], [place] [address] [phone] (Home) [email or web address] English (Preferred) White / Not Hispanic or Latino  Reason for Referral   Procedure (Routine) Procedure (Routine)  Status Reason Specialty Diagnoses / Procedures Referred By Contact Referred To Contact  Pending Review   Diagnoses  H/O syncope    Procedures  24/48 Hr Holter Monitor-Interpretation  Fath, Aloha Gell, MD  [address], [place] [address]  Phone: [phone]  Fax: [phone]         Procedure (Routine) Procedure (Routine)  Status Reason Specialty Diagnoses / Procedures Referred By Contact Referred To Contact  Pending Review   Diagnoses  H/O syncope    Procedures  24/48 Hr Holter Monitor-Scan/Analysis ([facility])  Fath, Aloha Gell, MD  [address]  [place], [place] [address]  Phone: [phone]  Fax: [phone]         Procedure (Routine) Procedure (Routine)  Status Reason Specialty Diagnoses / Procedures Referred By Contact Referred To Contact  Pending Review   Diagnoses  H/O syncope    Procedures  48 Hr Holter Monitor  Sydnee Levans, MD  [address]  [place], [place] [address]  Phone: [phone]  Fax: [phone]        Reason for Visit    Reason Comments  Hospital Follow Up went to sleep--passed out pulled in the parking lot  Bradycardia my heart rate was real low  Fatigue I really tired    Encounter Details    Date Type Department Care Team Description  06/11/2016 Office Visit [facility]  [place], [place] [address]  [phone]  Sydnee Levans, [place] [place]  [place],  [address]  [phone]  [phone] (Fax)  H/O syncope (Primary Dx);  Pure hypercholesterolemia;  Essential hypertension, benign;  Atherosclerosis of native coronary artery of native heart with stable angina pectoris (CMS-HCC);  Bradycardia, sinus;  Syncope, unspecified syncope type   Social History - as of this encounter   Tobacco Use Types Packs/Day Years Used Date  Former Smoker      Smokeless Tobacco: Never Used      Comments: quit 25 yrs ago   Alcohol Use Drinks/Week oz/Week Comments  Yes 0 Standard drinks or equivalent  0.0  occassionally   Sex Assigned at Birth Date Recorded  Not on file    Last Filed Vital Signs - in this encounter   Vital Sign Reading Time Taken  Blood Pressure 120/64 06/11/2016 4:23 PM EDT  Pulse 60 06/11/2016 4:23 PM EDT  Temperature - -  Respiratory Rate 12 06/11/2016 4:23 PM EDT  Oxygen Saturation - -  Inhaled Oxygen Concentration - -  Weight 83 kg (183 lb) 06/11/2016 4:23 PM EDT  Height 157.5 cm (5\' 2" ) 06/11/2016 4:23 PM EDT  Body Mass Index 33.47 06/11/2016 4:23 PM EDT  Progress Notes -  in this encounter   Sydnee Levans, MD - 06/11/2016 4:00 PM EDT Formatting of this note may be different from the original.   Chief Complaint: Chief Complaint  Patient presents with  . Hospital Follow Up  went to sleep--passed out pulled in the parking lot  . Bradycardia  my heart rate was real low  . Fatigue  I really tired  Date of Service: 06/11/2016 Date of Birth: [birth year] PCP: Idelle Crouch, MD, MD  History of Present Illness: Cassandra [name] is a 82 y.o.female patient who returns for follow-up visit. Has a history of hyperlipidemia, coronary artery disease, hypertension as well as carotid disease. She was recently hospitalized and felt to have a non-ST elevation myocardial infarction. Cardiac catheterization revealed insignificant circumflex disease, 100% proximal RCA, 75% proximal LAD. She underwent PCI of the LAD with placement of a Xience 3.25 x 15 mm drug-eluting stent. She had been having some chest pain and shortness of breath and the symptoms have improved post PCI. She was subsequently admitted with a syncopal episode. She was found to be relatively bradycardic by EMS. This was felt to be the etiology of her syncope. She was taken off of metoprolol and discharged. She now presents for follow-up. She complains of weakness and generalized fatigue. Her heart rate is greater than 60 today. Past Medical and Surgical History  Past Medical History Past Medical History:  Diagnosis Date  . Arrhythmia  . Arthritis  . Cancer (CMS-HCC)  breast  . Carotid artery occlusion  . Carotid bruit  . Colon polyp  . COPD (chronic obstructive pulmonary disease) , unspecified (CMS-HCC)  . Coronary artery disease  . Diabetes mellitus type 2, uncomplicated (CMS-HCC)  . Gout  . Headache  . History of bone density study 02/17/2013, 12/12/2009  . History of breast cancer in female  . History of cardiac dysrhythmia  . Hyperlipidemia, unspecified  . Hypertension  . Neuropathy  .  Non-cardiac chest pain  . Sleep apnea  . Syncope and collapse  . Thyroid disease  . Tobacco abuse  . Venous disease   Past Surgical History She has a past surgical history that includes Hernia repair; Endoscopic Carpal Tunnel Release (Bilateral); Carotid endarterectomy; Cholecystectomy (04/2011); Cataract extraction; Appendectomy; Resection of a lump on her left hand; Trigger finger repair; Mastectomy (Bilateral, 1992); Breast implant replaced (01/2012); Colonoscopy (07/20/1997); Colonoscopy (05/13/2001, 06/17/2007); Colonoscopy (08/01/2014); egd (06/17/2007); Laparoscopic Tubal Ligation; Carotid stent; and Carpal tunnel release.   Medications and Allergies  Current Medications  Current Outpatient Prescriptions  Medication Sig Dispense Refill  . clopidogrel (PLAVIX) 75 mg tablet TAKE ONE TABLET BY MOUTH EVERY DAY WITH BREAKFAST 30 tablet 6  . insulin GLARGINE (LANTUS SOLOSTAR) pen injector (concentration 100 units/mL) Inject 42 Units subcutaneously nightly. 15 mL 5  . insulin LISPRO (HUMALOG KWIKPEN) pen injector (concentration 100 units/mL) Inject 12 Units subcutaneously 3 (three) times daily with meals. 15 mL 3  . levothyroxine (SYNTHROID, LEVOTHROID) 137 MCG tablet Take 137 mcg by mouth once daily. Take on an empty stomach with a glass of water at least 30-60 minutes before breakfast. 30 tablet 11  . lisinopril (PRINIVIL,ZESTRIL) 2.5 MG tablet Take 1 tab by mouth bid  . rOPINIRole (REQUIP) 5 MG tablet TAKE 1 TABLET BY MOUTH AT BEDTIME 90 tablet 1  . rosuvastatin (CRESTOR) 10 MG tablet Take 1 tablet (10 mg total) by mouth nightly. 30 tablet 11  . aspirin 81 MG EC tablet Take 81 mg by mouth once daily.  [name]  citalopram (CELEXA) 40 MG tablet TAKE ONE TABLET BY MOUTH EVERY DAY (Patient not taking: Reported on 06/11/2016) 90 tablet 3  . codeine-guaifenesin 10-100 mg/5 mL oral liquid Take 5 mLs by mouth every 6 (six) hours as needed. (Patient not taking: Reported on 06/11/2016 ) 240 mL 0  .  metoprolol tartrate (LOPRESSOR) 25 MG tablet TAKE ONE TABLET BY MOUTH TWICE DAILY (Patient not taking: Reported on 06/11/2016) 180 tablet 3  . nitroGLYcerin (NITROSTAT) 0.4 MG SL tablet Place under the tongue.  Glory Rosebush ULTRA TEST test strip Use 1 strip via meter three times daily as directed. 300 each 3   Current Facility-Administered Medications  Medication Dose Route Frequency Provider Last Rate Last Dose  . cyanocobalamin (VITAMIN B12) injection 1,000 mcg 1,000 mcg Intramuscular Q30 Days Bhakti Shirley Friar, MD 1,000 mcg at 04/16/16 1701  . cyanocobalamin (VITAMIN B12) injection 1,000 mcg 1,000 mcg Intramuscular Q30 Days Bhakti Shirley Friar, MD 1,000 mcg at 04/04/16 1449   Allergies: Review of patient's allergies indicates no known allergies.  Social and Family History  Social History reports that she has quit smoking. She has never used smokeless tobacco. She reports that she drinks alcohol. She reports that she does not use illicit drugs.  Family History Family History  Problem Relation Age of Onset  . Gallbladder disease Mother  . Heart attack Father 87  . Coronary artery disease Father  . Heart attack Brother  . Diabetes type II Brother  . Heart attack Brother  . Heart attack Brother  . Diabetes type II Sister  . Hypertension Other  . Thyroid disease Other  . Rheum arthritis Other  . Diabetes type II Sister  . Diabetes type II Sister   Review of Systems  Review of Systems  Constitutional: Positive for malaise/fatigue. Negative for chills, diaphoresis, fever and weight loss.  HENT: Negative for congestion, ear discharge, hearing loss and tinnitus.  Eyes: Negative for blurred vision.  Respiratory: Negative for cough, hemoptysis, sputum production and wheezing.  Cardiovascular: Negative for palpitations, orthopnea, claudication, leg swelling and PND.  Gastrointestinal: Negative for abdominal pain, blood in stool, constipation, diarrhea, heartburn, melena, nausea and  vomiting.  Genitourinary: Negative for dysuria, frequency, hematuria and urgency.  Musculoskeletal: Negative for back pain, falls, joint pain and myalgias.  Skin: Negative for itching and rash.  Neurological: Negative for tingling, focal weakness, loss of consciousness, weakness and headaches.  Endo/Heme/Allergies: Negative for polydipsia. Does not bruise/bleed easily.  Psychiatric/Behavioral: Negative for depression, memory loss and substance abuse. The patient is not nervous/anxious.    Physical Examination   Vitals: BP 120/64  Pulse 60  Resp 12  Ht 157.5 cm (5\' 2" )  Wt 83 kg (183 lb)  BMI 33.47 kg/m2 Ht:157.5 cm (5\' 2" ) Wt:83 kg (183 lb) HQI:ONGE surface area is 1.91 meters squared. Body mass index is 33.47 kg/(m^2).  Wt Readings from Last 3 Encounters:  06/11/16 83 kg (183 lb)  05/28/16 86.2 kg (190 lb)  05/07/16 (P) 91.2 kg (201 lb)   BP Readings from Last 3 Encounters:  06/11/16 120/64  05/28/16 120/68  05/07/16 (P) 124/80   General appearance appears in no acute distress  Head Mouth and Eye exam Normocephalic, without obvious abnormality, atraumatic Dentition is good Eyes appear anicteric   Neck exam Thyroid: normal  Nodes: no obvious adenopathy  LUNGS Breath Sounds: Normal Percussion: Normal  CARDIOVASCULAR JVP CV wave: no HJR: no Elevation at 90 degrees: None Carotid Pulse: normal pulsation bilaterally Bruit: 1+ bilateral carotid bruits Apex: apical  impulse normal  Auscultation Rhythm: normal sinus rhythm S1: normal S2: normal Clicks: no Rub: no Murmurs: 1/6 medium pitched mid systolic blowing at lower left sternal border  Gallop: None ABDOMEN Liver enlargement: no Pulsatile aorta: no Ascites: no Bruits: no  EXTREMITIES Clubbing: no Edema: trace to 1+ bilateral pedal edema Pulses: peripheral pulses symmetrical Femoral Bruits: no Amputation: no SKIN Rash: no Cyanosis: no Embolic phemonenon: no Bruising: no NEURO Alert and  Oriented to person, place and time: yes Non focal: yes  PSYCH: Pt appears to have normal affect  LABS REVIEWED Last 3 CBC results: Lab Results  Component Value Date  WBC 8.8 05/07/2016  WBC 10.9 (H) 09/18/2015  WBC 8.9 03/30/2015   Lab Results  Component Value Date  HGB 10.9 (L) 05/07/2016  HGB 13.3 09/18/2015  HGB 11.9 (L) 03/30/2015   Lab Results  Component Value Date  HCT 34.8 (L) 05/07/2016  HCT 42.3 09/18/2015  HCT 38.7 03/30/2015   Lab Results  Component Value Date  PLT 285 05/07/2016  PLT 304 09/18/2015  PLT 299 03/30/2015   Lab Results  Component Value Date  CREATININE 0.8 05/07/2016  BUN 13 05/07/2016  NA 141 05/07/2016  K 4.7 05/07/2016  CL 104 05/07/2016  CO2 28.5 05/07/2016   Lab Results  Component Value Date  HGBA1C 8.9 (H) 04/04/2016   Lab Results  Component Value Date  HDL 50.2 09/18/2015  HDL 40.4 03/30/2015  HDL 45.5 08/15/2014   Lab Results  Component Value Date  LDLCALC 139 (H) 09/18/2015  LDLCALC 117 03/30/2015  LDLCALC 89 08/15/2014   Lab Results  Component Value Date  TRIG 223 (H) 09/18/2015  TRIG 127 03/30/2015  TRIG 134 08/15/2014   Lab Results  Component Value Date  ALT 10 05/07/2016  AST 17 05/07/2016  ALKPHOS 45 05/07/2016   Lab Results  Component Value Date  TSH 7.555 (H) 04/04/2016   Assessment and Plan   82 y.o. female with  ICD-10-CM ICD-9-CM  1. Right carotid bruit-etiology of dizziness is unclear. Followed by vascular surgery R09.89 785.9  2. Essential hypertension, benign-blood pressure is currently controlled. Would remain off of metoprolol. Will continue with this and dash diet I10 401.1  3. Pure hypercholesterolemia-remains on med lovastatin. LDL goal of less than 100 E78.0 272.0  4. Atherosclerosis of native coronary artery of native heart with stable angina pectoris-doing well status post PCI of the LAD where she is 75% stenosis felt to be the cause of her nonspecific ST elevation myocardial  infarction. She is doing well on current regimen. Will continue with DUAP for now and follow. Remain off of metoprolol. I25.119 414.01  413.9  5. Type II diabetes mellitus with manifestations-continue with metformin and insulin with a hemoglobin A1c goal of less than 6 E11.8 250.90   6. Syncope-etiology unclear but may have been over treatment with metoprolol. Will remain off of this and place a 48 hour Holter monitor determine if there is significant pauses or bradycardia remaining.  Return in about 4 weeks (around 07/09/2016).  These notes generated with voice recognition software. I apologize for typographical errors.  Sydnee Levans, MD      Plan of Treatment - as of this encounter   Upcoming Encounters Upcoming Encounters  Date Type Specialty Care Team Description  07/11/2016 Ancillary Orders Lab Idelle Crouch, MD  [address]  [facility] [place], [place] [address]  [phone]  [phone] (Fax)    07/11/2016 Office Visit Cardiology Fath, Aloha Gell, MD  [facility] [address]  [place], [place] [address]  [phone]  [phone] (Fax)    07/18/2016 Office Visit Internal Medicine Idelle Crouch, MD  [facility] [place], [place] [address]  [phone]  [phone] ([phone]    08/08/2016 Office Visit Cardiology Fath, Aloha Gell, MD  [address]  [place], [place] [address]  [phone]  [phone] (Fax)    08/13/2016 Office Visit Cardiology Fath, Aloha Gell, MD  [address] [address]  [place], [place] [address]  [phone]  [phone] (Fax)    09/12/2016 Ancillary Orders Lab Verne Carrow, MD  [facility] [address]  [place], [place] [address]  [phone]  [phone] (Fax)    09/19/2016 Office Visit Endocrinology Adella Hare [place], MD    [place]  [place], [place] [address]  [phone]  [phone] (Fax)     Miscellaneous Results - in this encounter    48 Hr Holter Monitor (06/20/2016 3:06 PM) 48 Hr Holter Monitor (06/20/2016 3:06 PM)  Narrative  Holter Results    Date of Study: June 11, 2016  Indication: Syncope    Hours recorded: 48  Total Beats: 422,016  Heart Rate:   Average: 147 bpm  Minimum: 96 bpm at 10:11 PM  Maximum: 169 bpm at 12:05 PM  Pauses > 2 seconds: 0  Longest RR interval: 1.2 sec at 9 : 53: P.m.    Ventricular Ectopy:Supraventricular Ectopy  Total: 77Total: 0  Isolated: 77Isolated: 0  Couplets: 0Pairs: 0  Runs: 0Runs: 0  Longest: N/ALongest: N/A  Fastest: N/AFastest: N/A    Summary:  1.Atrial fibrillation or.No pauses.Fairly rapid ventricular response with 2-1-3-1 block.      Sydnee Levans, MD       Visit Diagnoses    Diagnosis  H/O syncope - Primary  Pure hypercholesterolemia  Essential hypertension, benign  Atherosclerosis of native coronary artery of native heart with stable angina pectoris (CMS-HCC)  Bradycardia, sinus  Other specified cardiac dysrhythmias   Syncope, unspecified syncope type   Discontinued Medications - as of this encounter   Prescription Sig. Discontinue Reason Start Date End Date  gabapentin (NEURONTIN) 300 MG capsule  Indications: Neuropathy Take 1 capsule (300 mg total) by mouth 3 (three) times daily. Discontinued by another clinician 05/16/2015 06/11/2016   Historical Medications - added in this encounter   This list may reflect changes made after this encounter.  Medication Sig. Disp. Refills Start Date End Date  lisinopril (PRINIVIL,ZESTRIL) 2.5 MG tablet  2 (two) times daily. Take 1 tab by mouth bid   06/08/2016 06/20/2016   Orders - in this encounter  Cardiac Services Count Last Ordered Date First Ordered Date  24/48 [place],  [facility] AFFILIATE  06/11/2016   24/48 HR HOLTER MONITOR-SCAN/ANALYSIS, [facility] AFFILIATE  06/11/2016    Document Information   Service Providers Document Coverage Dates Oct. 10, 2017 - Oct. 11, 2017  [place] [phone] (Work) [place], [place] [address]   Encounter Providers Sydnee Levans MD (Attending) tel:[phone] (Work) fax:[phone] [place],  [address]   Encounter Date Oct. 10, 2017   Show All Sections

## 2016-07-03 NOTE — Transfer of Care (Signed)
Immediate Anesthesia Transfer of Care Note  Patient: Cassandra [name]  Procedure(s) Performed: Procedure(s): INSERTION PACEMAKER (Left)  Patient Location: PACU  Anesthesia Type:General  Level of Consciousness: awake and patient cooperative  Airway & Oxygen Therapy: Patient Spontanous Breathing  Post-op Assessment: Report given to RN and Post -op Vital signs reviewed and stable  Post vital signs: Reviewed and stable  Last Vitals:  Vitals:   07/03/16 1238 07/03/16 1521  BP: 129/62 143/80  Pulse: (!) 55 64  Resp: 16 17  Temp: 36.7 C (!) (P) 36 C    Last Pain:  Vitals:   07/03/16 1238  TempSrc: Oral  PainSc: 8          Complications: No apparent anesthesia complications

## 2016-07-03 NOTE — Interval H&P Note (Signed)
History and Physical Interval Note:  07/03/2016 1:05 PM  Cassandra [name]  has presented today for surgery, with the diagnosis of sss  The various methods of treatment have been discussed with the patient and family. After consideration of risks, benefits and other options for treatment, the patient has consented to  Procedure(s): INSERTION PACEMAKER (Left) as a surgical intervention .  The patient's history has been reviewed, patient examined, no change in status, stable for surgery.  I have reviewed the patient's chart and labs.  Questions were answered to the patient's satisfaction.     Larna Capelle [employer]

## 2016-07-03 NOTE — Anesthesia Preprocedure Evaluation (Addendum)
Anesthesia Evaluation  Patient identified by MRN, date of birth, ID band Patient awake    Reviewed: Allergy & Precautions, NPO status , Patient's Chart, lab work & pertinent test results  Airway Mallampati: II       Dental  (+) Upper Dentures, Partial Lower   Pulmonary COPD, former smoker,    breath sounds clear to auscultation       Cardiovascular Exercise Tolerance: Good hypertension, Pt. on medications + CAD and + Peripheral Vascular Disease   Rhythm:Regular Rate:Normal     Neuro/Psych  Headaches,    GI/Hepatic negative GI ROS, Neg liver ROS,   Endo/Other  diabetes, Type 1, Insulin DependentHypothyroidism   Renal/GU negative Renal ROS     Musculoskeletal   Abdominal   Peds  Hematology   Anesthesia Other Findings   Reproductive/Obstetrics                            Anesthesia Physical Anesthesia Plan  ASA: III  Anesthesia Plan: MAC   Post-op Pain Management:    Induction: Intravenous  Airway Management Planned: Natural Airway and Nasal Cannula  Additional Equipment:   Intra-op Plan:   Post-operative Plan:   Informed Consent: I have reviewed the patients History and Physical, chart, labs and discussed the procedure including the risks, benefits and alternatives for the proposed anesthesia with the patient or authorized representative who has indicated his/her understanding and acceptance.     Plan Discussed with: CRNA  Anesthesia Plan Comments:         Anesthesia Quick Evaluation

## 2016-07-04 ENCOUNTER — Encounter: Payer: Self-pay | Admitting: Cardiology

## 2016-07-04 DIAGNOSIS — I495 Sick sinus syndrome: Secondary | ICD-10-CM | POA: Diagnosis not present

## 2016-07-04 LAB — GLUCOSE, CAPILLARY: GLUCOSE-CAPILLARY: 189 mg/dL — AB (ref 65–99)

## 2016-07-04 MED ORDER — CEPHALEXIN 250 MG PO CAPS: 250.0000 mg | ORAL_CAPSULE | Freq: Four times a day (QID) | ORAL | 0 refills | Status: DC

## 2016-07-04 NOTE — Progress Notes (Signed)
Patient is discharge home in a stable condition , summary and f/u care given to both pt and husband , verbalized understanding , left with husband

## 2016-07-04 NOTE — Discharge Summary (Signed)
Physician Discharge Summary  Patient ID: Cassandra [name] MRN: [id number] DOB/AGE: Jun 02, 1933 82 y.o.  Admit date: 07/03/2016 Discharge date: 07/04/2016  Primary Discharge Diagnosis Sick sinus syndrome Secondary Discharge Diagnosis atrial fibrillation  Significant Diagnostic Studies: yes  Consults: None  Hospital Course: The patient underwent successful pacemaker implantation on 82/01/36 without complication. The patient was returned to the telemetry she had an uncomplicated hospital stay. On the morning of 07/04/2016 the patient was ambulating without difficulty and was discharged home.   Discharge Exam: Blood pressure (!) 185/73, pulse 67, temperature 98 F (36.7 C), temperature source Oral, resp. rate 18, height 5\' 2"  (1.575 m), weight 83.9 kg (185 lb), SpO2 96 %.   Head: Normocephalic, without obvious abnormality, atraumatic Eyes: conjunctivae/corneas clear. PERRL, EOM's intact. Fundi benign. Ears: normal TM's and external ear canals both ears Nose: Nares normal. Septum midline. Mucosa normal. No drainage or sinus tenderness. Throat: lips, mucosa, and tongue normal; teeth and gums normal Neck: no adenopathy, no carotid bruit, no JVD, supple, symmetrical, trachea midline and thyroid not enlarged, symmetric, no tenderness/mass/nodules Back: symmetric, no curvature. ROM normal. No CVA tenderness. Resp: clear to auscultation bilaterally Chest wall: no tenderness Breasts: normal appearance, no masses or tenderness Cardio: regular rate and rhythm, S1, S2 normal, no murmur, click, rub or gallop GI: soft, non-tender; bowel sounds normal; no masses,  no organomegaly Extremities: extremities normal, atraumatic, no cyanosis or edema Pulses: 2+ and symmetric Skin: Skin color, texture, turgor normal. No rashes or lesions Lymph nodes: Cervical, supraclavicular, and axillary nodes normal. Neurologic: Grossly normal Labs:   Lab Results  Component Value Date   WBC 9.9 06/27/2016   HGB 12.4 06/27/2016   HCT 36.8 06/27/2016   MCV 80.9 06/27/2016   PLT 236 06/27/2016    Recent Labs Lab 06/27/16 0913  NA 138  K 4.2  CL 102  CO2 28  BUN 18  CREATININE 0.83  CALCIUM 9.2  GLUCOSE 152*      Radiology: No pneumothorax EKG: Atrial paced with ventricular sensing  FOLLOW UP PLANS AND APPOINTMENTS    Medication List    TAKE these medications   apixaban 5 MG Tabs tablet Commonly known as:  ELIQUIS Take 1 tablet (5 mg total) by mouth 2 (two) times daily.   aspirin EC 81 MG tablet Take 81 mg by mouth daily.   cephALEXin 250 MG capsule Commonly known as:  KEFLEX Take 1 capsule (250 mg total) by mouth 4 (four) times daily.   citalopram 40 MG tablet Commonly known as:  CELEXA TAKE ONE TABLET BY MOUTH EVERY NIGHT AT BEDTIME.   clopidogrel 75 MG tablet Commonly known as:  PLAVIX Take 1 tablet (75 mg total) by mouth daily with breakfast.   colchicine 0.6 MG tablet Take 1 tablet (0.6 mg total) by mouth daily.   diltiazem 180 MG 24 hr capsule Commonly known as:  CARDIZEM CD Take 1 capsule (180 mg total) by mouth daily.   furosemide 40 MG tablet Commonly known as:  LASIX Take 40 mg by mouth daily.   HUMALOG KWIKPEN 100 UNIT/ML KiwkPen Generic drug:  insulin lispro Inject 12 Units into the skin 2 (two) times daily before a meal. *Use as directed per sliding scale*   insulin glargine 100 UNIT/ML injection Commonly known as:  LANTUS Inject 0.42 mLs (42 Units total) into the skin at bedtime. What changed:  how much to take   levothyroxine 112 MCG tablet Commonly known as:  SYNTHROID, LEVOTHROID Take 1 tablet (112 mcg total)  by mouth daily before breakfast.   lisinopril 2.5 MG tablet Commonly known as:  PRINIVIL,ZESTRIL Take 1 tablet (2.5 mg total) by mouth 2 (two) times daily.   metFORMIN 500 MG 24 hr tablet Commonly known as:  GLUCOPHAGE-XR Take 2 tablets (1,000 mg total) by mouth 2 (two) times daily. Please start this on 8/24   nitroGLYCERIN  0.4 MG SL tablet Commonly known as:  NITROSTAT Place 1 tablet (0.4 mg total) under the tongue every 5 (five) minutes as needed for chest pain.   ropinirole 5 MG tablet Commonly known as:  REQUIP Take 5 mg by mouth at bedtime.   rosuvastatin 10 MG tablet Commonly known as:  CRESTOR Take 10 mg by mouth at bedtime.   VITAMIN B-12 IJ Inject 1,000 mcg as directed every 30 (thirty) days.   Vitamin D3 2000 units Tabs Take 2,000 Units by mouth daily.      Follow-up Information    Teodoro Spray, MD Follow up in 1 week(s).   Specialty:  Cardiology Contact information: [place] [address] [phone]           BRING ALL MEDICATIONS WITH YOU TO FOLLOW UP APPOINTMENTS  Time spent with patient to include physician time: 25 minutes Signed:  Isaias Cowman MD, PhD, [facility] 07/04/2016, 8:36 AM

## 2016-07-04 NOTE — Discharge Instructions (Signed)
Do not left left arm above head. May shower on 07/05/2016. Resume Plavix 07/06/2016. Leave dressing until follow-up with Dr. Ubaldo Glassing.

## 2016-07-04 NOTE — Care Management Obs Status (Signed)
[place] NOTIFICATION   Patient Details  Name: Cassandra [name] MRN: [id number] Date of Birth: Sep 01, 1933   Medicare Observation Status Notification Given:  Yes    Beau Fanny, RN 07/04/2016, 9:09 AM

## 2016-07-05 NOTE — Anesthesia Postprocedure Evaluation (Signed)
Anesthesia Post Note  Patient: Cassandra [name]  Procedure(s) Performed: Procedure(s) (LRB): INSERTION PACEMAKER (Left)  Patient location during evaluation: PACU Anesthesia Type: MAC Level of consciousness: awake Pain management: pain level controlled Vital Signs Assessment: post-procedure vital signs reviewed and stable Respiratory status: spontaneous breathing Cardiovascular status: stable Anesthetic complications: no    Last Vitals:  Vitals:   07/04/16 0540 07/04/16 0833  BP: (!) 145/65 (!) 185/73  Pulse: 60 67  Resp: 18 18  Temp: 36.8 C 36.7 C    Last Pain:  Vitals:   07/04/16 0833  TempSrc: Oral  PainSc: 0-No pain                 VAN STAVEREN,Omarri Eich

## 2016-07-08 ENCOUNTER — Encounter: Payer: Medicare Other | Admitting: Physical Therapy

## 2016-07-22 ENCOUNTER — Encounter: Payer: Medicare Other | Admitting: Physical Therapy

## 2016-08-05 ENCOUNTER — Encounter: Payer: Medicare Other | Admitting: Physical Therapy

## 2016-08-19 ENCOUNTER — Encounter: Payer: Medicare Other | Admitting: Physical Therapy

## 2016-10-29 ENCOUNTER — Ambulatory Visit: Payer: Medicare Other | Attending: Internal Medicine

## 2016-11-25 ENCOUNTER — Ambulatory Visit
Admission: RE | Admit: 2016-11-25 | Discharge: 2016-11-25 | Disposition: A | Discharge status: Home / Self Care | Payer: Medicare Other | Source: Ambulatory Visit | Attending: Internal Medicine | Admitting: Internal Medicine

## 2016-11-25 DIAGNOSIS — Z1231 Encounter for screening mammogram for malignant neoplasm of breast: Secondary | ICD-10-CM | POA: Insufficient documentation

## 2017-02-14 ENCOUNTER — Ambulatory Visit (INDEPENDENT_AMBULATORY_CARE_PROVIDER_SITE_OTHER): Payer: Medicare Other | Admitting: Vascular Surgery

## 2017-02-14 ENCOUNTER — Other Ambulatory Visit (INDEPENDENT_AMBULATORY_CARE_PROVIDER_SITE_OTHER): Payer: Self-pay | Admitting: Vascular Surgery

## 2017-02-14 ENCOUNTER — Encounter (INDEPENDENT_AMBULATORY_CARE_PROVIDER_SITE_OTHER): Payer: Medicare Other

## 2017-02-14 DIAGNOSIS — I6523 Occlusion and stenosis of bilateral carotid arteries: Secondary | ICD-10-CM

## 2017-05-07 ENCOUNTER — Other Ambulatory Visit: Payer: Self-pay | Admitting: Internal Medicine

## 2017-05-07 DIAGNOSIS — R600 Localized edema: Secondary | ICD-10-CM

## 2017-05-09 ENCOUNTER — Ambulatory Visit
Admission: RE | Admit: 2017-05-09 | Discharge: 2017-05-09 | Disposition: A | Discharge status: Home / Self Care | Payer: Medicare Other | Source: Ambulatory Visit | Attending: Internal Medicine | Admitting: Internal Medicine

## 2017-05-09 DIAGNOSIS — M7122 Synovial cyst of popliteal space [Baker], left knee: Secondary | ICD-10-CM | POA: Insufficient documentation

## 2017-05-09 DIAGNOSIS — R600 Localized edema: Secondary | ICD-10-CM | POA: Diagnosis present

## 2017-07-03 ENCOUNTER — Other Ambulatory Visit: Payer: Self-pay | Admitting: Internal Medicine

## 2017-07-03 DIAGNOSIS — R270 Ataxia, unspecified: Secondary | ICD-10-CM

## 2017-08-04 ENCOUNTER — Ambulatory Visit
Admission: RE | Admit: 2017-08-04 | Discharge: 2017-08-04 | Disposition: A | Discharge status: Home / Self Care | Payer: Medicare Other | Source: Ambulatory Visit | Attending: Internal Medicine | Admitting: Internal Medicine

## 2017-08-04 DIAGNOSIS — R270 Ataxia, unspecified: Secondary | ICD-10-CM | POA: Insufficient documentation

## 2017-08-17 ENCOUNTER — Emergency Department: Payer: Medicare Other

## 2017-08-17 ENCOUNTER — Inpatient Hospital Stay: Payer: Medicare Other | Admitting: Anesthesiology

## 2017-08-17 ENCOUNTER — Other Ambulatory Visit: Payer: Self-pay

## 2017-08-17 ENCOUNTER — Encounter
Admission: EM | Disposition: A | Discharge status: Home / Self Care | Payer: Self-pay | Source: Home / Self Care | Attending: Internal Medicine

## 2017-08-17 ENCOUNTER — Inpatient Hospital Stay
Admission: EM | Admit: 2017-08-17 | Discharge: 2017-08-20 | DRG: 853 | Disposition: A | Discharge status: Home / Self Care | Payer: Medicare Other | Attending: Internal Medicine | Admitting: Internal Medicine

## 2017-08-17 ENCOUNTER — Encounter: Payer: Self-pay | Admitting: Internal Medicine

## 2017-08-17 DIAGNOSIS — Z9049 Acquired absence of other specified parts of digestive tract: Secondary | ICD-10-CM | POA: Diagnosis not present

## 2017-08-17 DIAGNOSIS — I248 Other forms of acute ischemic heart disease: Secondary | ICD-10-CM | POA: Diagnosis present

## 2017-08-17 DIAGNOSIS — M1990 Unspecified osteoarthritis, unspecified site: Secondary | ICD-10-CM | POA: Diagnosis present

## 2017-08-17 DIAGNOSIS — N201 Calculus of ureter: Secondary | ICD-10-CM

## 2017-08-17 DIAGNOSIS — N132 Hydronephrosis with renal and ureteral calculous obstruction: Secondary | ICD-10-CM

## 2017-08-17 DIAGNOSIS — Z950 Presence of cardiac pacemaker: Secondary | ICD-10-CM | POA: Diagnosis not present

## 2017-08-17 DIAGNOSIS — E7800 Pure hypercholesterolemia, unspecified: Secondary | ICD-10-CM | POA: Diagnosis present

## 2017-08-17 DIAGNOSIS — Z7901 Long term (current) use of anticoagulants: Secondary | ICD-10-CM

## 2017-08-17 DIAGNOSIS — E538 Deficiency of other specified B group vitamins: Secondary | ICD-10-CM | POA: Diagnosis present

## 2017-08-17 DIAGNOSIS — Z9011 Acquired absence of right breast and nipple: Secondary | ICD-10-CM

## 2017-08-17 DIAGNOSIS — A419 Sepsis, unspecified organism: Secondary | ICD-10-CM

## 2017-08-17 DIAGNOSIS — E872 Acidosis: Secondary | ICD-10-CM | POA: Diagnosis present

## 2017-08-17 DIAGNOSIS — Z955 Presence of coronary angioplasty implant and graft: Secondary | ICD-10-CM | POA: Diagnosis not present

## 2017-08-17 DIAGNOSIS — E875 Hyperkalemia: Secondary | ICD-10-CM | POA: Diagnosis present

## 2017-08-17 DIAGNOSIS — I482 Chronic atrial fibrillation: Secondary | ICD-10-CM | POA: Diagnosis present

## 2017-08-17 DIAGNOSIS — N136 Pyonephrosis: Secondary | ICD-10-CM | POA: Diagnosis present

## 2017-08-17 DIAGNOSIS — Z794 Long term (current) use of insulin: Secondary | ICD-10-CM

## 2017-08-17 DIAGNOSIS — A4150 Gram-negative sepsis, unspecified: Secondary | ICD-10-CM | POA: Diagnosis present

## 2017-08-17 DIAGNOSIS — E039 Hypothyroidism, unspecified: Secondary | ICD-10-CM | POA: Diagnosis present

## 2017-08-17 DIAGNOSIS — Z85828 Personal history of other malignant neoplasm of skin: Secondary | ICD-10-CM | POA: Diagnosis not present

## 2017-08-17 DIAGNOSIS — I10 Essential (primary) hypertension: Secondary | ICD-10-CM | POA: Diagnosis present

## 2017-08-17 DIAGNOSIS — N131 Hydronephrosis with ureteral stricture, not elsewhere classified: Secondary | ICD-10-CM

## 2017-08-17 DIAGNOSIS — Z9221 Personal history of antineoplastic chemotherapy: Secondary | ICD-10-CM

## 2017-08-17 DIAGNOSIS — Z23 Encounter for immunization: Secondary | ICD-10-CM | POA: Diagnosis present

## 2017-08-17 DIAGNOSIS — Z7902 Long term (current) use of antithrombotics/antiplatelets: Secondary | ICD-10-CM

## 2017-08-17 DIAGNOSIS — Z853 Personal history of malignant neoplasm of breast: Secondary | ICD-10-CM

## 2017-08-17 DIAGNOSIS — N390 Urinary tract infection, site not specified: Secondary | ICD-10-CM

## 2017-08-17 DIAGNOSIS — N179 Acute kidney failure, unspecified: Secondary | ICD-10-CM | POA: Diagnosis present

## 2017-08-17 DIAGNOSIS — J9621 Acute and chronic respiratory failure with hypoxia: Secondary | ICD-10-CM | POA: Diagnosis present

## 2017-08-17 DIAGNOSIS — Z87891 Personal history of nicotine dependence: Secondary | ICD-10-CM | POA: Diagnosis not present

## 2017-08-17 DIAGNOSIS — I2510 Atherosclerotic heart disease of native coronary artery without angina pectoris: Secondary | ICD-10-CM | POA: Diagnosis present

## 2017-08-17 DIAGNOSIS — G4733 Obstructive sleep apnea (adult) (pediatric): Secondary | ICD-10-CM | POA: Diagnosis present

## 2017-08-17 DIAGNOSIS — I252 Old myocardial infarction: Secondary | ICD-10-CM

## 2017-08-17 DIAGNOSIS — Z79899 Other long term (current) drug therapy: Secondary | ICD-10-CM

## 2017-08-17 DIAGNOSIS — J449 Chronic obstructive pulmonary disease, unspecified: Secondary | ICD-10-CM | POA: Diagnosis present

## 2017-08-17 DIAGNOSIS — N10 Acute tubulo-interstitial nephritis: Secondary | ICD-10-CM

## 2017-08-17 DIAGNOSIS — R0603 Acute respiratory distress: Secondary | ICD-10-CM

## 2017-08-17 HISTORY — PX: CYSTOSCOPY W/ URETERAL STENT PLACEMENT: SHX1429

## 2017-08-17 LAB — CBC
HCT: 42.7 % (ref 35.0–47.0)
HEMATOCRIT: 34.5 % — AB (ref 35.0–47.0)
HEMOGLOBIN: 13.2 g/dL (ref 12.0–16.0)
HEMOGLOBIN: 11 g/dL — AB (ref 12.0–16.0)
MCH: 24.8 pg — ABNORMAL LOW (ref 26.0–34.0)
MCH: 25.2 pg — AB (ref 26.0–34.0)
MCHC: 30.8 g/dL — AB (ref 32.0–36.0)
MCHC: 31.9 g/dL — AB (ref 32.0–36.0)
MCV: 80.3 fL (ref 80.0–100.0)
MCV: 78.9 fL — ABNORMAL LOW (ref 80.0–100.0)
PLATELETS: 354 10*3/uL (ref 150–440)
Platelets: 254 10*3/uL (ref 150–440)
RBC: 5.32 MIL/uL — AB (ref 3.80–5.20)
RBC: 4.37 MIL/uL (ref 3.80–5.20)
RDW: 16.4 % — ABNORMAL HIGH (ref 11.5–14.5)
RDW: 16.5 % — AB (ref 11.5–14.5)
WBC: 19.6 10*3/uL — ABNORMAL HIGH (ref 3.6–11.0)
WBC: 17.2 10*3/uL — ABNORMAL HIGH (ref 3.6–11.0)

## 2017-08-17 LAB — BRAIN NATRIURETIC PEPTIDE: B Natriuretic Peptide: 527 pg/mL — ABNORMAL HIGH (ref 0.0–100.0)

## 2017-08-17 LAB — BLOOD GAS, ARTERIAL
Acid-base deficit: 1.7 mmol/L (ref 0.0–2.0)
BICARBONATE: 23.7 mmol/L (ref 20.0–28.0)
DELIVERY SYSTEMS: POSITIVE
EXPIRATORY PAP: 5
FIO2: 0.4
Inspiratory PAP: 10
MECHANICAL RATE: 8
O2 Saturation: 96.4 %
Patient temperature: 37
pCO2 arterial: 42 mmHg (ref 32.0–48.0)
pH, Arterial: 7.36 (ref 7.350–7.450)
pO2, Arterial: 88 mmHg (ref 83.0–108.0)

## 2017-08-17 LAB — COMPREHENSIVE METABOLIC PANEL
ALK PHOS: 57 U/L (ref 38–126)
ALT: 12 U/L — ABNORMAL LOW (ref 14–54)
ALT: 11 U/L — ABNORMAL LOW (ref 14–54)
ANION GAP: 8 (ref 5–15)
AST: 28 U/L (ref 15–41)
AST: 25 U/L (ref 15–41)
Albumin: 4.1 g/dL (ref 3.5–5.0)
Albumin: 3.1 g/dL — ABNORMAL LOW (ref 3.5–5.0)
Alkaline Phosphatase: 80 U/L (ref 38–126)
Anion gap: 14 (ref 5–15)
BILIRUBIN TOTAL: 0.9 mg/dL (ref 0.3–1.2)
BUN: 23 mg/dL — ABNORMAL HIGH (ref 6–20)
BUN: 23 mg/dL — ABNORMAL HIGH (ref 6–20)
CALCIUM: 8.2 mg/dL — AB (ref 8.9–10.3)
CHLORIDE: 106 mmol/L (ref 101–111)
CO2: 24 mmol/L (ref 22–32)
CO2: 23 mmol/L (ref 22–32)
Calcium: 9.6 mg/dL (ref 8.9–10.3)
Chloride: 106 mmol/L (ref 101–111)
Creatinine, Ser: 1.14 mg/dL — ABNORMAL HIGH (ref 0.44–1.00)
Creatinine, Ser: 1.03 mg/dL — ABNORMAL HIGH (ref 0.44–1.00)
GFR calc non Af Amer: 44 mL/min — ABNORMAL LOW (ref >60)
GFR calc non Af Amer: 49 mL/min — ABNORMAL LOW (ref >60)
GFR, EST AFRICAN AMERICAN: 50 mL/min — AB (ref >60)
GFR, EST AFRICAN AMERICAN: 57 mL/min — AB (ref >60)
Glucose, Bld: 169 mg/dL — ABNORMAL HIGH (ref 65–99)
Glucose, Bld: 200 mg/dL — ABNORMAL HIGH (ref 65–99)
POTASSIUM: 5.5 mmol/L — AB (ref 3.5–5.1)
POTASSIUM: 4 mmol/L (ref 3.5–5.1)
SODIUM: 144 mmol/L (ref 135–145)
Sodium: 137 mmol/L (ref 135–145)
TOTAL PROTEIN: 6.3 g/dL — AB (ref 6.5–8.1)
Total Bilirubin: 0.6 mg/dL (ref 0.3–1.2)
Total Protein: 8.3 g/dL — ABNORMAL HIGH (ref 6.5–8.1)

## 2017-08-17 LAB — APTT: APTT: 33 s (ref 24–36)

## 2017-08-17 LAB — BLOOD GAS, VENOUS
ACID-BASE DEFICIT: 1.6 mmol/L (ref 0.0–2.0)
BICARBONATE: 22.9 mmol/L (ref 20.0–28.0)
O2 Saturation: 59.5 %
PCO2 VEN: 37 mmHg — AB (ref 44.0–60.0)
PH VEN: 7.4 (ref 7.250–7.430)
PO2 VEN: UNDETERMINED mmHg (ref 32.0–45.0)
Patient temperature: 37

## 2017-08-17 LAB — PHOSPHORUS: PHOSPHORUS: 4 mg/dL (ref 2.5–4.6)

## 2017-08-17 LAB — MRSA PCR SCREENING: MRSA BY PCR: NEGATIVE

## 2017-08-17 LAB — URINALYSIS, COMPLETE (UACMP) WITH MICROSCOPIC
BILIRUBIN URINE: NEGATIVE
GLUCOSE, UA: NEGATIVE mg/dL
Ketones, ur: NEGATIVE mg/dL
NITRITE: NEGATIVE
PH: 5 (ref 5.0–8.0)
PROTEIN: 100 mg/dL — AB
Specific Gravity, Urine: 1.014 (ref 1.005–1.030)

## 2017-08-17 LAB — MAGNESIUM: Magnesium: 1.5 mg/dL — ABNORMAL LOW (ref 1.7–2.4)

## 2017-08-17 LAB — LACTIC ACID, PLASMA
Lactic Acid, Venous: 7.7 mmol/L (ref 0.5–1.9)
Lactic Acid, Venous: 2.4 mmol/L (ref 0.5–1.9)

## 2017-08-17 LAB — TROPONIN I
TROPONIN I: 0.3 ng/mL — AB (ref <0.03)
TROPONIN I: 0.73 ng/mL — AB (ref <0.03)

## 2017-08-17 LAB — FIBRINOGEN: FIBRINOGEN: 447 mg/dL (ref 210–475)

## 2017-08-17 LAB — GLUCOSE, CAPILLARY
GLUCOSE-CAPILLARY: 176 mg/dL — AB (ref 65–99)
GLUCOSE-CAPILLARY: 217 mg/dL — AB (ref 65–99)

## 2017-08-17 LAB — PROTIME-INR
INR: 1.22
PROTHROMBIN TIME: 15.3 s — AB (ref 11.4–15.2)

## 2017-08-17 LAB — FIBRIN DERIVATIVES D-DIMER (ARMC ONLY): Fibrin derivatives D-dimer (ARMC): 783.78 ng/mL (FEU) — ABNORMAL HIGH (ref 0.00–499.00)

## 2017-08-17 SURGERY — CYSTOSCOPY, WITH RETROGRADE PYELOGRAM AND URETERAL STENT INSERTION
Anesthesia: General | Site: Ureter | Laterality: Left | Wound class: Clean Contaminated

## 2017-08-17 MED ORDER — CIPROFLOXACIN IN D5W 400 MG/200ML IV SOLN
400.0000 mg | Freq: Once | INTRAVENOUS | Status: AC
  Administered 2017-08-17: 400 mg via INTRAVENOUS

## 2017-08-17 MED ORDER — ONDANSETRON HCL 4 MG PO TABS: 4.0000 mg | ORAL_TABLET | Freq: Four times a day (QID) | ORAL | Status: DC | PRN

## 2017-08-17 MED ORDER — PIPERACILLIN-TAZOBACTAM 3.375 G IVPB
INTRAVENOUS | Status: AC
  Filled 2017-08-17: qty 50

## 2017-08-17 MED ORDER — SODIUM CHLORIDE 0.9 % IV BOLUS (SEPSIS): 500.0000 mL | Freq: Once | INTRAVENOUS | Status: DC

## 2017-08-17 MED ORDER — PHENYLEPHRINE HCL 10 MG/ML IJ SOLN
INTRAMUSCULAR | Status: DC | PRN
  Administered 2017-08-17 (×5): 100 ug via INTRAVENOUS

## 2017-08-17 MED ORDER — INSULIN GLARGINE 100 UNIT/ML ~~LOC~~ SOLN
50.0000 [IU] | Freq: Every day | SUBCUTANEOUS | Status: DC
  Administered 2017-08-17 – 2017-08-19 (×3): 50 [IU] via SUBCUTANEOUS
  Filled 2017-08-17 (×5): qty 0.5

## 2017-08-17 MED ORDER — ROPINIROLE HCL 1 MG PO TABS
5.0000 mg | ORAL_TABLET | Freq: Every day | ORAL | Status: DC
  Administered 2017-08-17 – 2017-08-19 (×3): 5 mg via ORAL
  Filled 2017-08-17 (×4): qty 5

## 2017-08-17 MED ORDER — ROCURONIUM BROMIDE 100 MG/10ML IV SOLN
INTRAVENOUS | Status: DC | PRN
  Administered 2017-08-17: 30 mg via INTRAVENOUS
  Administered 2017-08-17: 10 mg via INTRAVENOUS

## 2017-08-17 MED ORDER — VANCOMYCIN HCL IN DEXTROSE 1-5 GM/200ML-% IV SOLN
INTRAVENOUS | Status: AC
  Filled 2017-08-17: qty 200

## 2017-08-17 MED ORDER — VANCOMYCIN HCL IN DEXTROSE 1-5 GM/200ML-% IV SOLN
1000.0000 mg | INTRAVENOUS | Status: DC
  Administered 2017-08-18: 1000 mg via INTRAVENOUS
  Filled 2017-08-17 (×2): qty 200

## 2017-08-17 MED ORDER — PIPERACILLIN-TAZOBACTAM 3.375 G IVPB 30 MIN
3.3750 g | Freq: Once | INTRAVENOUS | Status: AC
  Administered 2017-08-17: 3.375 g via INTRAVENOUS

## 2017-08-17 MED ORDER — ACETAMINOPHEN 325 MG PO TABS
650.0000 mg | ORAL_TABLET | Freq: Four times a day (QID) | ORAL | Status: DC | PRN
  Administered 2017-08-19: 650 mg via ORAL
  Filled 2017-08-17: qty 2

## 2017-08-17 MED ORDER — ASPIRIN EC 81 MG PO TBEC: 81.0000 mg | DELAYED_RELEASE_TABLET | Freq: Every day | ORAL | Status: DC

## 2017-08-17 MED ORDER — SUGAMMADEX SODIUM 200 MG/2ML IV SOLN
INTRAVENOUS | Status: AC
  Filled 2017-08-17: qty 2

## 2017-08-17 MED ORDER — LEVOTHYROXINE SODIUM 112 MCG PO TABS
112.0000 ug | ORAL_TABLET | Freq: Every day | ORAL | Status: DC
  Administered 2017-08-18 – 2017-08-20 (×3): 112 ug via ORAL
  Filled 2017-08-17 (×4): qty 1

## 2017-08-17 MED ORDER — CITALOPRAM HYDROBROMIDE 20 MG PO TABS
40.0000 mg | ORAL_TABLET | Freq: Every day | ORAL | Status: DC
  Administered 2017-08-17 – 2017-08-19 (×3): 40 mg via ORAL
  Filled 2017-08-17 (×3): qty 2

## 2017-08-17 MED ORDER — SUCCINYLCHOLINE CHLORIDE 20 MG/ML IJ SOLN
INTRAMUSCULAR | Status: DC | PRN
  Administered 2017-08-17: 100 mg via INTRAVENOUS

## 2017-08-17 MED ORDER — FENTANYL CITRATE (PF) 100 MCG/2ML IJ SOLN: 25.0000 ug | INTRAMUSCULAR | Status: DC | PRN

## 2017-08-17 MED ORDER — IPRATROPIUM-ALBUTEROL 0.5-2.5 (3) MG/3ML IN SOLN
3.0000 mL | Freq: Four times a day (QID) | RESPIRATORY_TRACT | Status: DC
  Administered 2017-08-17 – 2017-08-19 (×5): 3 mL via RESPIRATORY_TRACT
  Filled 2017-08-17 (×5): qty 3

## 2017-08-17 MED ORDER — VANCOMYCIN HCL IN DEXTROSE 1-5 GM/200ML-% IV SOLN
1000.0000 mg | Freq: Once | INTRAVENOUS | Status: AC
  Administered 2017-08-17: 1000 mg via INTRAVENOUS

## 2017-08-17 MED ORDER — PANTOPRAZOLE SODIUM 40 MG IV SOLR
40.0000 mg | Freq: Two times a day (BID) | INTRAVENOUS | Status: DC
  Administered 2017-08-17 – 2017-08-18 (×2): 40 mg via INTRAVENOUS
  Filled 2017-08-17 (×2): qty 40

## 2017-08-17 MED ORDER — ACETAMINOPHEN 650 MG RE SUPP: 650.0000 mg | Freq: Four times a day (QID) | RECTAL | Status: DC | PRN

## 2017-08-17 MED ORDER — FUROSEMIDE 40 MG PO TABS
40.0000 mg | ORAL_TABLET | Freq: Every day | ORAL | Status: DC
  Administered 2017-08-18 – 2017-08-19 (×2): 40 mg via ORAL
  Filled 2017-08-17: qty 2
  Filled 2017-08-17: qty 1

## 2017-08-17 MED ORDER — CLOPIDOGREL BISULFATE 75 MG PO TABS
75.0000 mg | ORAL_TABLET | Freq: Every day | ORAL | Status: DC
  Administered 2017-08-18 – 2017-08-20 (×3): 75 mg via ORAL
  Filled 2017-08-17 (×3): qty 1

## 2017-08-17 MED ORDER — MIDAZOLAM HCL 2 MG/2ML IJ SOLN
INTRAMUSCULAR | Status: AC
  Filled 2017-08-17: qty 2

## 2017-08-17 MED ORDER — MORPHINE SULFATE (PF) 2 MG/ML IV SOLN: 2.0000 mg | INTRAVENOUS | Status: DC | PRN

## 2017-08-17 MED ORDER — SODIUM CHLORIDE 0.9 % IV BOLUS (SEPSIS): 1000.0000 mL | Freq: Once | INTRAVENOUS | Status: DC

## 2017-08-17 MED ORDER — VITAMIN D 1000 UNITS PO TABS
2000.0000 [IU] | ORAL_TABLET | Freq: Every day | ORAL | Status: DC
Start: 2017-08-18 — End: 2017-08-20
  Administered 2017-08-18 – 2017-08-19 (×2): 2000 [IU] via ORAL
  Filled 2017-08-17 (×2): qty 2

## 2017-08-17 MED ORDER — ROSUVASTATIN CALCIUM 10 MG PO TABS
10.0000 mg | ORAL_TABLET | Freq: Every day | ORAL | Status: DC
  Administered 2017-08-17 – 2017-08-19 (×3): 10 mg via ORAL
  Filled 2017-08-17 (×3): qty 1

## 2017-08-17 MED ORDER — NITROGLYCERIN 0.4 MG SL SUBL: 0.4000 mg | SUBLINGUAL_TABLET | SUBLINGUAL | Status: DC | PRN

## 2017-08-17 MED ORDER — DOCUSATE SODIUM 100 MG PO CAPS
100.0000 mg | ORAL_CAPSULE | Freq: Two times a day (BID) | ORAL | Status: DC
  Administered 2017-08-17 – 2017-08-19 (×5): 100 mg via ORAL
  Filled 2017-08-17 (×5): qty 1

## 2017-08-17 MED ORDER — SODIUM CHLORIDE 0.9 % IV BOLUS (SEPSIS)
1000.0000 mL | Freq: Once | INTRAVENOUS | Status: AC
  Administered 2017-08-17: 1000 mL via INTRAVENOUS

## 2017-08-17 MED ORDER — FENTANYL CITRATE (PF) 100 MCG/2ML IJ SOLN
INTRAMUSCULAR | Status: AC
  Filled 2017-08-17: qty 2

## 2017-08-17 MED ORDER — BISACODYL 10 MG RE SUPP
10.0000 mg | Freq: Every day | RECTAL | Status: DC | PRN
Start: 2017-08-17 — End: 2017-08-20

## 2017-08-17 MED ORDER — FENTANYL CITRATE (PF) 100 MCG/2ML IJ SOLN
INTRAMUSCULAR | Status: DC | PRN
  Administered 2017-08-17: 50 ug via INTRAVENOUS

## 2017-08-17 MED ORDER — ETOMIDATE 2 MG/ML IV SOLN
INTRAVENOUS | Status: DC | PRN
  Administered 2017-08-17: 16 mg via INTRAVENOUS

## 2017-08-17 MED ORDER — ONDANSETRON HCL 4 MG/2ML IJ SOLN: 4.0000 mg | Freq: Four times a day (QID) | INTRAMUSCULAR | Status: DC | PRN

## 2017-08-17 MED ORDER — LIDOCAINE HCL (CARDIAC) 20 MG/ML IV SOLN
INTRAVENOUS | Status: DC | PRN
  Administered 2017-08-17: 100 mg via INTRAVENOUS

## 2017-08-17 MED ORDER — MAGNESIUM SULFATE 2 GM/50ML IV SOLN
2.0000 g | Freq: Once | INTRAVENOUS | Status: AC
  Administered 2017-08-17: 2 g via INTRAVENOUS
  Filled 2017-08-17: qty 50

## 2017-08-17 MED ORDER — APIXABAN 5 MG PO TABS
5.0000 mg | ORAL_TABLET | Freq: Two times a day (BID) | ORAL | Status: DC
Start: 2017-08-18 — End: 2017-08-20
  Administered 2017-08-18 – 2017-08-19 (×4): 5 mg via ORAL
  Filled 2017-08-17 (×4): qty 1

## 2017-08-17 MED ORDER — ONDANSETRON HCL 4 MG/2ML IJ SOLN: 4.0000 mg | Freq: Once | INTRAMUSCULAR | Status: DC | PRN

## 2017-08-17 MED ORDER — DEXAMETHASONE SODIUM PHOSPHATE 10 MG/ML IJ SOLN
INTRAMUSCULAR | Status: DC | PRN
  Administered 2017-08-17: 5 mg via INTRAVENOUS

## 2017-08-17 MED ORDER — IPRATROPIUM-ALBUTEROL 0.5-2.5 (3) MG/3ML IN SOLN
3.0000 mL | Freq: Once | RESPIRATORY_TRACT | Status: AC
  Administered 2017-08-17: 3 mL via RESPIRATORY_TRACT
  Filled 2017-08-17: qty 3

## 2017-08-17 MED ORDER — INSULIN ASPART 100 UNIT/ML ~~LOC~~ SOLN
0.0000 [IU] | Freq: Three times a day (TID) | SUBCUTANEOUS | Status: DC
Start: 2017-08-18 — End: 2017-08-19
  Administered 2017-08-18: 7 [IU] via SUBCUTANEOUS
  Administered 2017-08-18: 5 [IU] via SUBCUTANEOUS
  Administered 2017-08-18 – 2017-08-19 (×2): 3 [IU] via SUBCUTANEOUS
  Administered 2017-08-19: 2 [IU] via SUBCUTANEOUS
  Filled 2017-08-17 (×5): qty 1

## 2017-08-17 MED ORDER — ALBUTEROL SULFATE (2.5 MG/3ML) 0.083% IN NEBU
5.0000 mg | INHALATION_SOLUTION | Freq: Once | RESPIRATORY_TRACT | Status: AC
  Administered 2017-08-17: 5 mg via RESPIRATORY_TRACT
  Filled 2017-08-17: qty 6

## 2017-08-17 MED ORDER — SUGAMMADEX SODIUM 200 MG/2ML IV SOLN
INTRAVENOUS | Status: DC | PRN
  Administered 2017-08-17: 163.2 mg via INTRAVENOUS

## 2017-08-17 MED ORDER — SODIUM CHLORIDE 0.9 % IV SOLN
INTRAVENOUS | Status: DC
  Administered 2017-08-17 – 2017-08-18 (×2): via INTRAVENOUS

## 2017-08-17 MED ORDER — LACTATED RINGERS IV SOLN
INTRAVENOUS | Status: DC | PRN
  Administered 2017-08-17: 19:00:00 via INTRAVENOUS

## 2017-08-17 MED ORDER — DILTIAZEM HCL ER COATED BEADS 180 MG PO CP24
180.0000 mg | ORAL_CAPSULE | Freq: Every day | ORAL | Status: DC
Start: 2017-08-18 — End: 2017-08-20
  Administered 2017-08-18: 180 mg via ORAL
  Filled 2017-08-17 (×3): qty 1

## 2017-08-17 MED ORDER — ONDANSETRON HCL 4 MG/2ML IJ SOLN
INTRAMUSCULAR | Status: DC | PRN
  Administered 2017-08-17: 4 mg via INTRAVENOUS

## 2017-08-17 MED ORDER — PIPERACILLIN-TAZOBACTAM 3.375 G IVPB
3.3750 g | Freq: Three times a day (TID) | INTRAVENOUS | Status: DC
  Administered 2017-08-17 – 2017-08-18 (×2): 3.375 g via INTRAVENOUS
  Filled 2017-08-17 (×2): qty 50

## 2017-08-17 MED ORDER — CIPROFLOXACIN IN D5W 400 MG/200ML IV SOLN
INTRAVENOUS | Status: AC
  Filled 2017-08-17: qty 200

## 2017-08-17 SURGICAL SUPPLY — 19 items
BAG DRAIN CYSTO-URO LG1000N (MISCELLANEOUS) ×3 IMPLANT
CATH URETL 5X70 OPEN END (CATHETERS) ×3 IMPLANT
CATH URETL OPEN END 6X70 (CATHETERS) IMPLANT
GLOVE BIO SURGEON STRL SZ7.5 (GLOVE) ×6 IMPLANT
GLOVE BIO SURGEON STRL SZ8 (GLOVE) ×6 IMPLANT
GOWN STRL REUS W/ TWL LRG LVL4 (GOWN DISPOSABLE) ×1 IMPLANT
GOWN STRL REUS W/ TWL XL LVL3 (GOWN DISPOSABLE) ×1 IMPLANT
GOWN STRL REUS W/TWL LRG LVL4 (GOWN DISPOSABLE) ×2
GOWN STRL REUS W/TWL XL LVL3 (GOWN DISPOSABLE) ×2
GUIDEWIRE STR ZIPWIRE 035X150 (MISCELLANEOUS) ×3 IMPLANT
PACK CYSTO AR (MISCELLANEOUS) ×3 IMPLANT
PREP PVP WINGED SPONGE (MISCELLANEOUS) IMPLANT
SET CYSTO W/LG BORE CLAMP LF (SET/KITS/TRAYS/PACK) ×3 IMPLANT
SOL .9 NS 3000ML IRR  AL (IV SOLUTION) ×4
SOL .9 NS 3000ML IRR UROMATIC (IV SOLUTION) ×2 IMPLANT
STENT URET 6FRX24 CONTOUR (STENTS) ×3 IMPLANT
STENT URET 6FRX26 CONTOUR (STENTS) IMPLANT
TRAY FOLEY MTR SLVR 16FR STAT (CATHETERS) ×3 IMPLANT
WATER STERILE IRR 1000ML POUR (IV SOLUTION) ×3 IMPLANT

## 2017-08-17 NOTE — Consult Note (Signed)
PULMONARY / CRITICAL CARE MEDICINE   Name: Cassandra [name] MRN: [id number] DOB: [birth year]    ADMISSION DATE:  08/17/2017   CONSULTATION DATE:  08/17/2017  REFERRING MD:  Dr Junious Silk  Reason: ICU management status post cystoscopy  HISTORY OF PRESENT ILLNESS:   This is an 83 year old Caucasian female with a past medical history as indicated below who presented to the ED with complaints of left flank pain.  Her CT abdomen showed a distal left ureteral stone with hydronephrosis and perinephric fat stranding.  She was taken for an emergent cystoscopy with left retrograde pyelogram and left ureteral stent placement.  Postoperatively, she was hypoxic and hence extubated to BiPAP. Patient is awake and offers no complaints.  Denies abdominal pain, fever, chills, nausea and vomiting she has an indwelling Foley that is draining mildly bloody urine.  She is already on antibiotics.  Her lactic acid level preop was 7.7.  She also had a WBC of 19.6 stop  PAST MEDICAL HISTORY :  She  has a past medical history of Acquired hypothyroidism (01/06/2014), Arthritis, B12 deficiency (02/15/2014), Benign essential hypertension (01/06/2014), Breast cancer ([place]) (1982), CAD (coronary artery disease), native coronary artery (01/06/2014), Carotid artery calcification, Chronic airway obstruction, not elsewhere classified (01/06/2014), Diabetes mellitus without complication ([place]), Pure hypercholesterolemia (01/06/2014), Skin cancer, and Sleep apnea.  PAST SURGICAL HISTORY: She  has a past surgical history that includes Mastectomy (Right, 1982); Augmentation mammaplasty (Bilateral, 1982/redo in 2014); Carotid angioplasty; Skin cancer excision; Cardiac catheterization (N/A, 04/23/2016); Cardiac catheterization (Left, 04/23/2016); Coronary angioplasty with stent (september 9th 2017); Tubal ligation; Appendectomy; Cholecystectomy; and Pacemaker insertion (Left, 07/03/2016).  No Known Allergies  No current facility-administered  medications on file prior to encounter.    Current Outpatient Medications on File Prior to Encounter  Medication Sig  . apixaban (ELIQUIS) 5 MG TABS tablet Take 1 tablet (5 mg total) by mouth 2 (two) times daily.  [name] aspirin EC 81 MG tablet Take 81 mg by mouth daily.  . Cholecalciferol (VITAMIN D3) 2000 units TABS Take 2,000 Units by mouth daily.  . citalopram (CELEXA) 40 MG tablet TAKE ONE TABLET BY MOUTH EVERY NIGHT AT BEDTIME.  . clopidogrel (PLAVIX) 75 MG tablet Take 1 tablet (75 mg total) by mouth daily with breakfast.  . diltiazem (CARDIZEM CD) 180 MG 24 hr capsule Take 1 capsule (180 mg total) by mouth daily.  . furosemide (LASIX) 40 MG tablet Take 40 mg by mouth daily.  [name] HUMALOG KWIKPEN 100 UNIT/ML KiwkPen Inject 12 Units into the skin 2 (two) times daily before a meal. *Use as directed per sliding scale*  . insulin glargine (LANTUS) 100 UNIT/ML injection Inject 0.42 mLs (42 Units total) into the skin at bedtime. (Patient taking differently: Inject 50 Units into the skin at bedtime. )  . levothyroxine (SYNTHROID, LEVOTHROID) 112 MCG tablet Take 1 tablet (112 mcg total) by mouth daily before breakfast.  . metFORMIN (GLUCOPHAGE-XR) 500 MG 24 hr tablet Take 2 tablets (1,000 mg total) by mouth 2 (two) times daily. Please start this on 8/24 (Patient taking differently: Take 1,000 mg by mouth 3 (three) times daily. Pl)  . nitroGLYCERIN (NITROSTAT) 0.4 MG SL tablet Place 1 tablet (0.4 mg total) under the tongue every 5 (five) minutes as needed for chest pain.  . ropinirole (REQUIP) 5 MG tablet Take 5 mg by mouth at bedtime.   . rosuvastatin (CRESTOR) 10 MG tablet Take 10 mg by mouth at bedtime.   . colchicine 0.6 MG tablet Take 1 tablet (0.6  mg total) by mouth daily. (Patient not taking: Reported on 06/27/2016)  . lisinopril (PRINIVIL,ZESTRIL) 2.5 MG tablet Take 1 tablet (2.5 mg total) by mouth 2 (two) times daily. (Patient not taking: Reported on 08/17/2017)    FAMILY HISTORY:  Her indicated  that the status of her neg hx is unknown.   SOCIAL HISTORY: She  reports that she has quit smoking. she has never used smokeless tobacco. She reports that she drinks alcohol. She reports that she does not use drugs.  REVIEW OF SYSTEMS:   Unable to obtain as patient is currently on continuous BiPAP  SUBJECTIVE:   VITAL SIGNS: BP (!) 142/46   Pulse 82   Temp 99.3 F (37.4 C) (Oral)   Resp 17   Ht 5\' 2"  (1.575 m)   Wt 81.6 kg (180 lb)   LMP  (LMP Unknown)   SpO2 96%   BMI 32.92 kg/m   HEMODYNAMICS:    VENTILATOR SETTINGS:    INTAKE / OUTPUT: I/O last 3 completed shifts: In: 250 [IV Piggyback:250] Out: -   PHYSICAL EXAMINATION: General: Well-nourished, well-developed, no acute distress Neuro: Alert and oriented x4, cranial nerves intact HEENT: PERRLA, neck is supple with good range of motion, no JVD Cardiovascular: Apical pulse regular, S1-S2, grade 2-3 murmur Lungs: Bilateral breath sounds, no wheezing, breath sounds diminished in the bases bilaterally Abdomen: Nondistended, normal bowel sounds in all 4 quadrants, palpation reveals no organomegaly Musculoskeletal: Warm and dry, positive range of motion Skin: Warm and dry  LABS:  BMET Recent Labs  Lab 08/17/17 1605  NA 144  K 5.5*  CL 106  CO2 24  BUN 23*  CREATININE 1.14*  GLUCOSE 169*    Electrolytes Recent Labs  Lab 08/17/17 1605  CALCIUM 9.6    CBC Recent Labs  Lab 08/17/17 1605  WBC 19.6*  HGB 13.2  HCT 42.7  PLT 354    Coag's No results for input(s): APTT, INR in the last 168 hours.  Sepsis Markers Recent Labs  Lab 08/17/17 1605  LATICACIDVEN 7.7*    ABG No results for input(s): PHART, PCO2ART, PO2ART in the last 168 hours.  Liver Enzymes Recent Labs  Lab 08/17/17 1605  AST 28  ALT 12*  ALKPHOS 80  BILITOT 0.6  ALBUMIN 4.1    Cardiac Enzymes Recent Labs  Lab 08/17/17 1605  TROPONINI 0.30*    Glucose No results for input(s): GLUCAP in the last 168  hours.  Imaging Dg Chest Portable 1 View  Result Date: 08/17/2017 CLINICAL DATA:  Left flank pain radiating to the left lower abdomen. EXAM: PORTABLE CHEST 1 VIEW COMPARISON:  08/02/2016 FINDINGS: Stable borderline cardiomegaly with aortic atherosclerosis. Left-sided pacemaker apparatus with right atrial and right ventricular leads. Mild chronic interstitial prominence some of which appear to represent areas of interstitial fibrosis but without alveolar airspace consolidations or CHF. No pneumothorax. No significant pleural effusions. IMPRESSION: Stable borderline cardiomegaly with aortic atherosclerosis. Chronic diffuse prominence pulmonary interstitium likely representing chronic interstitial lung disease. No active pulmonary disease. Electronically Signed   By: Ashley Royalty M.D.   On: 08/17/2017 16:49   Ct Renal Stone Study  Result Date: 08/17/2017 CLINICAL DATA:  Flank pain.  Evaluate for kidney stone. EXAM: CT ABDOMEN AND PELVIS WITHOUT CONTRAST TECHNIQUE: Multidetector CT imaging of the abdomen and pelvis was performed following the standard protocol without IV contrast. COMPARISON:  Ultrasound of the abdomen 07/05/2009 FINDINGS: Lower chest: No acute abnormality. Hepatobiliary: No focal liver abnormality is seen. Status post cholecystectomy. No biliary  dilatation. Pancreas: Unremarkable. No pancreatic ductal dilatation or surrounding inflammatory changes. Spleen: Normal in size without focal abnormality. Adrenals/Urinary Tract: The adrenal glands appear normal. Normal appearance of the right kidney. Left-sided perinephric fat stranding and hydronephrosis is identified. Stone within the distal left ureter measures 4 mm, image 72 of series 2. Bladder normal. Stomach/Bowel: Stomach is within normal limits. Appendix surgically absent. No evidence of bowel wall thickening, distention, or inflammatory changes. Distal colonic diverticula noted. Vascular/Lymphatic: Aortic atherosclerosis. No aneurysm. No  abdominal or pelvic adenopathy. No inguinal adenopathy. Reproductive: Status post hysterectomy. No adnexal masses. Other: No abdominal wall hernia or abnormality. No abdominopelvic ascites. Musculoskeletal: Degenerative disc disease identified within the lumbar spine. There is a compression fracture involving the L1 vertebra which appears new from the most recent previous dated 05/18/2013. IMPRESSION: 1. Distal left ureteral calculus measures 4 mm. This results in left-sided hydronephrosis and perinephric fat stranding. 2. Age-indeterminate L1 compression fracture. 3.  Aortic Atherosclerosis (ICD10-I70.0). Electronically Signed   By: Kerby Moors M.D.   On: 08/17/2017 18:12   CULTURES: Urine culture Blood culture  ANTIBIOTICS: Vancomycin Zosyn Ciprofloxacin  SIGNIFICANT EVENTS: 08/17/2017: Admitted with sepsis secondary to left kidney stone  LINES/TUBES: Peripheral IVs Foley catheter  DISCUSSION: 83 year old presenting with sepsis secondary to left nephrolithiasis, left hydronephrosis now status post cystoscopy with left ureteral stent placement.  Patient is hemodynamically stable postop but her oxygen requirements are still high.  She is currently on continuous BiPAP.  We will try to transition her off BiPAP to CPAP which she uses at home  ASSESSMENT  Sepsis secondary to left kidney stone Left distal ureteral stone, left hydronephrosis  Cystoscopy with left retrograde pyelogram, left ureteral stent placement Severe lactic acidosis secondary to sepsis Acute on chronic respiratory failure secondary to sepsis and underlying sleep apnea Acute kidney injury Elevated troponin Sleep apnea Type 2 diabetes mellitus History of COPD, hypertension, coronary artery disease and carotid stenosis  PLAN Transition from BiPAP to CPAP IV antibiotics as above Follow-up cultures IV fluids Trend creatinine DIC panel ABG Repeat CBC CMP Trend lactic acid Foley care per protocol Elevated  troponins likely due to demand ischemia from sepsis; EKG changes Resume all home medications GI and DVT prophylaxis  FAMILY  - Updates: Patient updated on current treatment plan  - Inter-disciplinary family meet or Palliative Care meeting due by:  day 7  Toshie Demelo S. [facility] ANP-BC Pulmonary and Critical Care Medicine [facility] Pager [phone] or [phone]  08/17/2017, 7:53 PM

## 2017-08-17 NOTE — H&P (Signed)
History and Physical    Cassandra [name]:[id number] DOB: 1933-04-20 DOA: 08/17/2017  Referring physician: Dr. Quentin Cornwall PCP: Idelle Crouch, MD  Specialists: none  Chief Complaint: back/flank pain  HPI: Cassandra [name] is a 83 y.o. female has a past medical history significant for CAD, pacer, DM, and HTN now with acute left flank and groin pain. In ER, pt noted to be in respiratory distress requiring BiPAP. Also noted to have kidney stone with UTI and obstruction causing hydronephrosis. Labs are c/w sepsis. She is now admitted. Currently afebrile. Does have N/V but no diarrhea. Denies CP. But does c/o SOB and worsening LE edema  Review of Systems: The patient denies anorexia, fever, weight loss,, vision loss, decreased hearing, hoarseness, chest pain, syncope, balance deficits, hemoptysis, abdominal pain, melena, hematochezia, severe indigestion/heartburn, hematuria, incontinence, genital sores, muscle weakness, suspicious skin lesions, transient blindness, difficulty walking, depression, unusual weight change, abnormal bleeding, enlarged lymph nodes, angioedema, and breast masses.   Past Medical History:  Diagnosis Date  . Acquired hypothyroidism 01/06/2014  . Arthritis   . B12 deficiency 02/15/2014  . Benign essential hypertension 01/06/2014  . Breast cancer [facility]) 1982   Right breast cancer - chemotherapy  . CAD (coronary artery disease), native coronary artery 01/06/2014  . Carotid artery calcification   . Chronic airway obstruction, not elsewhere classified 01/06/2014  . Diabetes mellitus without complication ([place])   . Pure hypercholesterolemia 01/06/2014  . Skin cancer   . Sleep apnea    Past Surgical History:  Procedure Laterality Date  . APPENDECTOMY    . AUGMENTATION MAMMAPLASTY Bilateral 1982/redo in 2014  . CARDIAC CATHETERIZATION N/A 04/23/2016   Procedure: Left Heart Cath and Coronary Angiography;  Surgeon: Corey Skains, MD;  Location: [facility];   Service: Cardiovascular;  Laterality: N/A;  . CARDIAC CATHETERIZATION Left 04/23/2016   Procedure: Coronary Stent Intervention;  Surgeon: Yolonda Kida, MD;  Location: [facility];  Service: Cardiovascular;  Laterality: Left;  . CAROTID ARTERY ANGIOPLASTY    . CHOLECYSTECTOMY    . CORONARY ANGIOPLASTY WITH STENT PLACEMENT  september 9th 2017  . MASTECTOMY Right 1982  . PACEMAKER INSERTION Left 07/03/2016   Procedure: INSERTION PACEMAKER;  Surgeon: Isaias Cowman, MD;  Location: [facility];  Service: Cardiovascular;  Laterality: Left;  . SKIN CANCER EXCISION    . TUBAL LIGATION     Social History:  reports that she has quit smoking. she has never used smokeless tobacco. She reports that she drinks alcohol. She reports that she does not use drugs.  No Known Allergies  Family History  Problem Relation Age of Onset  . Prostate cancer Neg Hx   . Kidney cancer Neg Hx   . Breast cancer Neg Hx     Prior to Admission medications   Medication Sig Start Date End Date Taking? Authorizing Provider  apixaban (ELIQUIS) 5 MG TABS tablet Take 1 tablet (5 mg total) by mouth 2 (two) times daily. 06/16/16  Yes Bettey Costa, MD  aspirin EC 81 MG tablet Take 81 mg by mouth daily.   Yes [provider]  Cholecalciferol (VITAMIN D3) 2000 units TABS Take 2,000 Units by mouth daily.   Yes [provider]  citalopram (CELEXA) 40 MG tablet TAKE ONE TABLET BY MOUTH EVERY NIGHT AT BEDTIME. 07/07/14  Yes [provider]  clopidogrel (PLAVIX) 75 MG tablet Take 1 tablet (75 mg total) by mouth daily with breakfast. 04/24/16  Yes Bettey Costa, MD  diltiazem (CARDIZEM  CD) 180 MG 24 hr capsule Take 1 capsule (180 mg total) by mouth daily. 06/16/16  Yes Mody, Ulice Bold, MD  furosemide (LASIX) 40 MG tablet Take 40 mg by mouth daily.   Yes [provider]  HUMALOG KWIKPEN 100 UNIT/ML KiwkPen Inject 12 Units into the skin 2 (two) times daily before a meal. *Use as directed per  sliding scale* 03/03/15  Yes [provider]  insulin glargine (LANTUS) 100 UNIT/ML injection Inject 0.42 mLs (42 Units total) into the skin at bedtime. Patient taking differently: Inject 50 Units into the skin at bedtime.  06/16/16  Yes Mody, Ulice Bold, MD  levothyroxine (SYNTHROID, LEVOTHROID) 112 MCG tablet Take 1 tablet (112 mcg total) by mouth daily before breakfast. 06/17/16  Yes Mody, Sital, MD  metFORMIN (GLUCOPHAGE-XR) 500 MG 24 hr tablet Take 2 tablets (1,000 mg total) by mouth 2 (two) times daily. Please start this on 8/24 Patient taking differently: Take 1,000 mg by mouth 3 (three) times daily. Pl 04/24/16  Yes Mody, Sital, MD  nitroGLYCERIN (NITROSTAT) 0.4 MG SL tablet Place 1 tablet (0.4 mg total) under the tongue every 5 (five) minutes as needed for chest pain. 04/24/16  Yes Mody, Ulice Bold, MD  ropinirole (REQUIP) 5 MG tablet Take 5 mg by mouth at bedtime.  03/29/15  Yes [provider]  rosuvastatin (CRESTOR) 10 MG tablet Take 10 mg by mouth at bedtime.  03/25/16  Yes [provider]  colchicine 0.6 MG tablet Take 1 tablet (0.6 mg total) by mouth daily. Patient not taking: Reported on 06/27/2016 05/22/15   Hyatt, Max T, DPM  lisinopril (PRINIVIL,ZESTRIL) 2.5 MG tablet Take 1 tablet (2.5 mg total) by mouth 2 (two) times daily. Patient not taking: Reported on 08/17/2017 06/08/16   Fritzi Mandes, MD   Physical Exam: Vitals:   08/17/17 1549 08/17/17 1607 08/17/17 1630 08/17/17 1647  BP:   (!) 134/57   Pulse:   84   Resp:  (!) 33 (!) 27   Temp:  99.3 F (37.4 C)    TempSrc:  Oral    SpO2:   91% 94%  Weight: 81.6 kg (180 lb)     Height: 5\' 2"  (1.575 m)        General:  WDWN, [place]/AT, in moderate distress  Eyes: PERRL, EOMI, no scleral icterus, conjunctiva clear  ENT: moist oropharynx without exudate, TM's benign, dentition good  Neck: supple, no lymphadenopathy. No bruits or thyromegaly  Cardiovascular: regular rate without MRG; 2+ peripheral pulses, no JVD, 2+  peripheral edema  Respiratory: diffuse rhonchi without wheezes or rales. No dullness. Respiratory effort increased  Abdomen: soft, non tender to palpation, positive bowel sounds, no guarding, no rebound  Skin: no rashes or lesions  Musculoskeletal: normal bulk and tone, no joint swelling  Psychiatric: normal mood and affect, A&OX3  Neurologic: CN 2-12 grossly intact, Motor strength 5/5 in all 4 groups with symmetric DTR's and non-focal sensory exam  Labs on Admission:  Basic Metabolic Panel: Recent Labs  Lab 08/17/17 1605  NA 144  K 5.5*  CL 106  CO2 24  GLUCOSE 169*  BUN 23*  CREATININE 1.14*  CALCIUM 9.6   Liver Function Tests: Recent Labs  Lab 08/17/17 1605  AST 28  ALT 12*  ALKPHOS 80  BILITOT 0.6  PROT 8.3*  ALBUMIN 4.1   No results for input(s): LIPASE, AMYLASE in the last 168 hours. No results for input(s): AMMONIA in the last 168 hours. CBC: Recent Labs  Lab 08/17/17 1605  WBC  19.6*  HGB 13.2  HCT 42.7  MCV 80.3  PLT 354   Cardiac Enzymes: Recent Labs  Lab 08/17/17 1605  TROPONINI 0.30*    BNP (last 3 results) Recent Labs    08/17/17 1605  BNP 527.0*    ProBNP (last 3 results) No results for input(s): PROBNP in the last 8760 hours.  CBG: No results for input(s): GLUCAP in the last 168 hours.  Radiological Exams on Admission: Dg Chest Portable 1 View  Result Date: 08/17/2017 CLINICAL DATA:  Left flank pain radiating to the left lower abdomen. EXAM: PORTABLE CHEST 1 VIEW COMPARISON:  08/02/2016 FINDINGS: Stable borderline cardiomegaly with aortic atherosclerosis. Left-sided pacemaker apparatus with right atrial and right ventricular leads. Mild chronic interstitial prominence some of which appear to represent areas of interstitial fibrosis but without alveolar airspace consolidations or CHF. No pneumothorax. No significant pleural effusions. IMPRESSION: Stable borderline cardiomegaly with aortic atherosclerosis. Chronic diffuse  prominence pulmonary interstitium likely representing chronic interstitial lung disease. No active pulmonary disease. Electronically Signed   By: Ashley Royalty M.D.   On: 08/17/2017 16:49   Ct Renal Stone Study  Result Date: 08/17/2017 CLINICAL DATA:  Flank pain.  Evaluate for kidney stone. EXAM: CT ABDOMEN AND PELVIS WITHOUT CONTRAST TECHNIQUE: Multidetector CT imaging of the abdomen and pelvis was performed following the standard protocol without IV contrast. COMPARISON:  Ultrasound of the abdomen 07/05/2009 FINDINGS: Lower chest: No acute abnormality. Hepatobiliary: No focal liver abnormality is seen. Status post cholecystectomy. No biliary dilatation. Pancreas: Unremarkable. No pancreatic ductal dilatation or surrounding inflammatory changes. Spleen: Normal in size without focal abnormality. Adrenals/Urinary Tract: The adrenal glands appear normal. Normal appearance of the right kidney. Left-sided perinephric fat stranding and hydronephrosis is identified. Stone within the distal left ureter measures 4 mm, image 72 of series 2. Bladder normal. Stomach/Bowel: Stomach is within normal limits. Appendix surgically absent. No evidence of bowel wall thickening, distention, or inflammatory changes. Distal colonic diverticula noted. Vascular/Lymphatic: Aortic atherosclerosis. No aneurysm. No abdominal or pelvic adenopathy. No inguinal adenopathy. Reproductive: Status post hysterectomy. No adnexal masses. Other: No abdominal wall hernia or abnormality. No abdominopelvic ascites. Musculoskeletal: Degenerative disc disease identified within the lumbar spine. There is a compression fracture involving the L1 vertebra which appears new from the most recent previous dated 05/18/2013. IMPRESSION: 1. Distal left ureteral calculus measures 4 mm. This results in left-sided hydronephrosis and perinephric fat stranding. 2. Age-indeterminate L1 compression fracture. 3.  Aortic Atherosclerosis (ICD10-I70.0). Electronically Signed    By: Kerby Moors M.D.   On: 08/17/2017 18:12    EKG: Independently reviewed.  Assessment/Plan Principal Problem:   Sepsis ([place]) Active Problems:   UTI (urinary tract infection)   Acute respiratory distress   Hydronephrosis due to obstruction of ureter   Will admit to ICU with IV fluids, IV ABX, and BiPAP. Will go to OR for nephrostomy and stent placement. Urology and Cardiology consults placed. Begin SVN's and wean off BiPAP as tolerated. Follow sugars.   Diet: clear liquids Fluids: NS@75  DVT Prophylaxis: SQ Heparin  Code Status: FULL  Family Communication: yes  Disposition Plan: home  Time spent: 50 min

## 2017-08-17 NOTE — Progress Notes (Signed)
CODE SEPSIS - PHARMACY COMMUNICATION  **Broad Spectrum Antibiotics should be administered within 1 hour of Sepsis diagnosis**  Time Code Sepsis Called/Page Received: 1700  Antibiotics Ordered: Vancomycin and Zosyn  Time of 1st antibiotic administration: Vancomycin @ 1701, Zosyn @ 1702  Additional action taken by pharmacy: N/A  If necessary, Name of Provider/Nurse Contacted: N/A    Lendon Ka, PharmD Pharmacy Resident 08/17/2017  5:10 PM

## 2017-08-17 NOTE — Transfer of Care (Signed)
Immediate Anesthesia Transfer of Care Note  Patient: Cassandra [name]  Procedure(s) Performed: CYSTOSCOPY WITH RETROGRADE PYELOGRAM/URETERAL STENT PLACEMENT (Left )  Patient Location: ICU07  Anesthesia Type:General  Level of Consciousness: awake and alert   Airway & Oxygen Therapy: Patient Spontanous Breathing and Patient connected to face mask oxygen  Post-op Assessment: Report given to RN and Post -op Vital signs reviewed and stable  Post vital signs: Reviewed and stable  Last Vitals:  Vitals:   08/17/17 1647 08/17/17 1800  BP:  (!) 142/46  Pulse:  82  Resp:  17  Temp:    SpO2: 94% 96%    Last Pain:  Vitals:   08/17/17 1607  TempSrc: Oral  PainSc:          Complications: No apparent anesthesia complications

## 2017-08-17 NOTE — ED Provider Notes (Signed)
[facility] Emergency Department Provider Note    First MD Initiated Contact with Patient 08/17/17 1612     (approximate)  I have reviewed the triage vital signs and the nursing notes.   HISTORY  Chief Complaint Flank Pain; Abdominal Pain; Shortness of Breath; and Leg Swelling    HPI Cassandra [name] is a 83 y.o. female with a history of CAD status post pacemaker placement on Eliquis with no recent hospitalizations presents with chief complaint of left flank pain as well as dysuria low-grade fever and chills as well as worsening shortness of breath.  Patient has been treated in the past for bronchitis but no formal diagnosis of COPD.  No recent antibiotics.  Also has a history of hypertension and is been compliant with her medications.  He does complain of the pain being moderate to severe and nonradiating.  No associated nausea.   Past Medical History:  Diagnosis Date  . Acquired hypothyroidism 01/06/2014  . Arthritis   . B12 deficiency 02/15/2014  . Benign essential hypertension 01/06/2014  . Breast cancer [facility]) 1982   Right breast cancer - chemotherapy  . CAD (coronary artery disease), native coronary artery 01/06/2014  . Carotid artery calcification   . Chronic airway obstruction, not elsewhere classified 01/06/2014  . Diabetes mellitus without complication ([place])   . Pure hypercholesterolemia 01/06/2014  . Skin cancer   . Sleep apnea    Family History  Problem Relation Age of Onset  . Prostate cancer Neg Hx   . Kidney cancer Neg Hx   . Breast cancer Neg Hx    Past Surgical History:  Procedure Laterality Date  . APPENDECTOMY    . AUGMENTATION MAMMAPLASTY Bilateral 1982/redo in 2014  . CARDIAC CATHETERIZATION N/A 04/23/2016   Procedure: Left Heart Cath and Coronary Angiography;  Surgeon: Corey Skains, MD;  Location: [place] CV LAB;  Service: Cardiovascular;  Laterality: N/A;  . CARDIAC CATHETERIZATION Left 04/23/2016   Procedure: Coronary  Stent Intervention;  Surgeon: Yolonda Kida, MD;  Location: [place] CV LAB;  Service: Cardiovascular;  Laterality: Left;  . CAROTID ARTERY ANGIOPLASTY    . CHOLECYSTECTOMY    . CORONARY ANGIOPLASTY WITH STENT PLACEMENT  september 9th 2017  . MASTECTOMY Right 1982  . PACEMAKER INSERTION Left 07/03/2016   Procedure: INSERTION PACEMAKER;  Surgeon: Isaias Cowman, MD;  Location: [facility] ORS;  Service: Cardiovascular;  Laterality: Left;  . SKIN CANCER EXCISION    . TUBAL LIGATION     Patient Active Problem List   Diagnosis Date Noted  . Sepsis ([place]) 08/17/2017  . UTI (urinary tract infection) 08/17/2017  . Acute respiratory distress 08/17/2017  . 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 Sick sinus syndrome ([place]) 07/03/2016  . New onset atrial flutter ([place]) 06/14/2016  . Bradycardia, sinus 06/07/2016  . Syncope 06/06/2016  . Chest pain 04/21/2016  . Elevated troponin 04/21/2016  . Labile hypertension 04/21/2016  . Ischemic chest pain 04/21/2016  . Long-term insulin use ([place]) 05/24/2014  . Mild vitamin D deficiency 05/20/2014  . B12 deficiency 02/15/2014  . Acquired hypothyroidism 01/06/2014  . Chronic airway obstruction, not elsewhere classified 01/06/2014  . CAD (coronary artery disease), native coronary artery 01/06/2014  . Benign essential hypertension 01/06/2014  . Headache 01/06/2014  . Hypersomnia with sleep apnea 01/06/2014  . Pure hypercholesterolemia 01/06/2014  . Type II diabetes mellitus with manifestations ([place]) 01/06/2014      Prior to Admission medications   Medication Sig Start Date  End Date Taking? Authorizing Provider  apixaban (ELIQUIS) 5 MG TABS tablet Take 1 tablet (5 mg total) by mouth 2 (two) times daily. 06/16/16  Yes Bettey Costa, MD  aspirin EC 81 MG tablet Take 81 mg by mouth daily.   Yes [provider]  Cholecalciferol (VITAMIN D3) 2000 units TABS Take 2,000 Units by mouth daily.   Yes [provider]    citalopram (CELEXA) 40 MG tablet TAKE ONE TABLET BY MOUTH EVERY NIGHT AT BEDTIME. 07/07/14  Yes [provider]  clopidogrel (PLAVIX) 75 MG tablet Take 1 tablet (75 mg total) by mouth daily with breakfast. 04/24/16  Yes Mody, Sital, MD  diltiazem (CARDIZEM CD) 180 MG 24 hr capsule Take 1 capsule (180 mg total) by mouth daily. 06/16/16  Yes Mody, Ulice Bold, MD  furosemide (LASIX) 40 MG tablet Take 40 mg by mouth daily.   Yes [provider]  HUMALOG KWIKPEN 100 UNIT/ML KiwkPen Inject 12 Units into the skin 2 (two) times daily before a meal. *Use as directed per sliding scale* 03/03/15  Yes [provider]  insulin glargine (LANTUS) 100 UNIT/ML injection Inject 0.42 mLs (42 Units total) into the skin at bedtime. Patient taking differently: Inject 50 Units into the skin at bedtime.  06/16/16  Yes Mody, Ulice Bold, MD  levothyroxine (SYNTHROID, LEVOTHROID) 112 MCG tablet Take 1 tablet (112 mcg total) by mouth daily before breakfast. 06/17/16  Yes Mody, Sital, MD  metFORMIN (GLUCOPHAGE-XR) 500 MG 24 hr tablet Take 2 tablets (1,000 mg total) by mouth 2 (two) times daily. Please start this on 8/24 Patient taking differently: Take 1,000 mg by mouth 3 (three) times daily. Pl 04/24/16  Yes Mody, Sital, MD  nitroGLYCERIN (NITROSTAT) 0.4 MG SL tablet Place 1 tablet (0.4 mg total) under the tongue every 5 (five) minutes as needed for chest pain. 04/24/16  Yes Mody, Ulice Bold, MD  ropinirole (REQUIP) 5 MG tablet Take 5 mg by mouth at bedtime.  03/29/15  Yes [provider]  rosuvastatin (CRESTOR) 10 MG tablet Take 10 mg by mouth at bedtime.  03/25/16  Yes [provider]  colchicine 0.6 MG tablet Take 1 tablet (0.6 mg total) by mouth daily. Patient not taking: Reported on 06/27/2016 05/22/15   Hyatt, Max T, DPM  lisinopril (PRINIVIL,ZESTRIL) 2.5 MG tablet Take 1 tablet (2.5 mg total) by mouth 2 (two) times daily. Patient not taking: Reported on 08/17/2017 06/08/16   Fritzi Mandes, MD     Allergies Patient has no known allergies.    Social History Social History   Tobacco Use  . Smoking status: Former Research scientist (life sciences)  . Smokeless tobacco: Never Used  Substance Use Topics  . Alcohol use: Yes    Alcohol/week: 0.0 oz    Comment: occasionally  . Drug use: No    Review of Systems Patient denies headaches, rhinorrhea, blurry vision, numbness, shortness of breath, chest pain, edema, cough, abdominal pain, nausea, vomiting, diarrhea, dysuria, fevers, rashes or hallucinations unless otherwise stated above in HPI. ____________________________________________   PHYSICAL EXAM:  VITAL SIGNS: Vitals:   08/17/17 1647 08/17/17 1800  BP:  (!) 142/46  Pulse:  82  Resp:  17  Temp:    SpO2: 94% 96%    Constitutional: Alert with moderate respiratory distress in no acute distress. Eyes: Conjunctivae are normal.  Head: Atraumatic. Nose: No congestion/rhinnorhea. Mouth/Throat: Mucous membranes are moist.  Neck: No stridor. Painless ROM.  Cardiovascular: Normal rate, regular rhythm. Grossly normal heart sounds.  Good peripheral circulation. Respiratory: Moderate respiratory distress  tachypnea with prolonged expiratory phase with diffuse wheezing. Gastrointestinal: Soft and nontender. No distention. No abdominal bruits. + left CVA tenderness. Genitourinary:  Musculoskeletal: No lower extremity tenderness nor edema.  No joint effusions. Neurologic:  Normal speech and language. No gross focal neurologic deficits are appreciated. No facial droop Skin:  Skin is warm, dry and intact. No rash noted. Psychiatric: Mood and affect are normal. Speech and behavior are normal. ____________________________________________   LABS (all labs ordered are listed, but only abnormal results are displayed)  Results for orders placed or performed during the hospital encounter of 08/17/17 (from the past 24 hour(s))  CBC     Status: Abnormal   Collection Time: 08/17/17  4:05 PM  Result Value Ref  Range   WBC 19.6 (H) 3.6 - 11.0 K/uL   RBC 5.32 (H) 3.80 - 5.20 MIL/uL   Hemoglobin 13.2 12.0 - 16.0 g/dL   HCT 42.7 35.0 - 47.0 %   MCV 80.3 80.0 - 100.0 fL   MCH 24.8 (L) 26.0 - 34.0 pg   MCHC 30.8 (L) 32.0 - 36.0 g/dL   RDW 16.4 (H) 11.5 - 14.5 %   Platelets 354 150 - 440 K/uL  Troponin I     Status: Abnormal   Collection Time: 08/17/17  4:05 PM  Result Value Ref Range   Troponin I 0.30 (HH) <0.03 ng/mL  Comprehensive metabolic panel     Status: Abnormal   Collection Time: 08/17/17  4:05 PM  Result Value Ref Range   Sodium 144 135 - 145 mmol/L   Potassium 5.5 (H) 3.5 - 5.1 mmol/L   Chloride 106 101 - 111 mmol/L   CO2 24 22 - 32 mmol/L   Glucose, Bld 169 (H) 65 - 99 mg/dL   BUN 23 (H) 6 - 20 mg/dL   Creatinine, Ser 1.14 (H) 0.44 - 1.00 mg/dL   Calcium 9.6 8.9 - 10.3 mg/dL   Total Protein 8.3 (H) 6.5 - 8.1 g/dL   Albumin 4.1 3.5 - 5.0 g/dL   AST 28 15 - 41 U/L   ALT 12 (L) 14 - 54 U/L   Alkaline Phosphatase 80 38 - 126 U/L   Total Bilirubin 0.6 0.3 - 1.2 mg/dL   GFR calc non Af Amer 44 (L) >60 mL/min   GFR calc Af Amer 50 (L) >60 mL/min   Anion gap 14 5 - 15  Lactic acid, plasma     Status: Abnormal   Collection Time: 08/17/17  4:05 PM  Result Value Ref Range   Lactic Acid, Venous 7.7 (HH) 0.5 - 1.9 mmol/L  Brain natriuretic peptide     Status: Abnormal   Collection Time: 08/17/17  4:05 PM  Result Value Ref Range   B Natriuretic Peptide 527.0 (H) 0.0 - 100.0 pg/mL  Blood gas, venous     Status: Abnormal   Collection Time: 08/17/17  4:38 PM  Result Value Ref Range   pH, Ven 7.40 7.250 - 7.430   pCO2, Ven 37 (L) 44.0 - 60.0 mmHg   pO2, Ven UNABLE TO CALCULATE. 32.0 - 45.0 mmHg   Bicarbonate 22.9 20.0 - 28.0 mmol/L   Acid-base deficit 1.6 0.0 - 2.0 mmol/L   O2 Saturation 59.5 %   Patient temperature 37.0    Collection site VEIN    Sample type VENIPUNCTURE   Urinalysis, Complete w Microscopic     Status: Abnormal   Collection Time: 08/17/17  5:07 PM  Result Value  Ref Range   Color, Urine YELLOW (  A) YELLOW   APPearance HAZY (A) CLEAR   Specific Gravity, Urine 1.014 1.005 - 1.030   pH 5.0 5.0 - 8.0   Glucose, UA NEGATIVE NEGATIVE mg/dL   Hgb urine dipstick LARGE (A) NEGATIVE   Bilirubin Urine NEGATIVE NEGATIVE   Ketones, ur NEGATIVE NEGATIVE mg/dL   Protein, ur 100 (A) NEGATIVE mg/dL   Nitrite NEGATIVE NEGATIVE   Leukocytes, UA MODERATE (A) NEGATIVE   RBC / HPF TOO NUMEROUS TO COUNT 0 - 5 RBC/hpf   WBC, UA TOO NUMEROUS TO COUNT 0 - 5 WBC/hpf   Bacteria, UA RARE (A) NONE SEEN   Squamous Epithelial / LPF 0-5 (A) NONE SEEN   WBC Clumps PRESENT    Mucus PRESENT    ____________________________________________  EKG My review and personal interpretation at Time: 16:18   Indication: resp distress  Rate: 90  Rhythm: sinus Axis: left Other: no stemi, normal intervals, poor r wave progressions ____________________________________________  RADIOLOGY  I personally reviewed all radiographic images ordered to evaluate for the above acute complaints and reviewed radiology reports and findings.  These findings were personally discussed with the patient.  Please see medical record for radiology report.  ____________________________________________   PROCEDURES  Procedure(s) performed:  .Critical Care Performed by: Merlyn Lot, MD Authorized by: Merlyn Lot, MD   Critical care provider statement:    Critical care time (minutes):  45   Critical care time was exclusive of:  Separately billable procedures and treating other patients   Critical care was time spent personally by me on the following activities:  Development of treatment plan with patient or surrogate, discussions with consultants, evaluation of patient's response to treatment, examination of patient, obtaining history from patient or surrogate, ordering and performing treatments and interventions, ordering and review of laboratory studies, ordering and review of radiographic  studies, pulse oximetry, re-evaluation of patient's condition and review of old charts      Critical Care performed: 45no ____________________________________________   INITIAL IMPRESSION / ASSESSMENT AND PLAN / ED COURSE  Pertinent labs & imaging results that were available during my care of the patient were reviewed by me and considered in my medical decision making (see chart for details).  DDX: Asthma, copd, CHF, pna, ptx, malignancy, Pe, anemia, pyelo, metabolic derrangements, sepsis   KONI KANNAN is a 83 y.o. who presents to the ED with acute respiratory distress with significant left-sided flank pain.  Patient placed on BiPAP just for respiratory support.  She is currently protecting her airway.  Blood work will be sent further sepsis.  No evidence of ACS on EKG.  The patient will be placed on continuous pulse oximetry and telemetry for monitoring.  Laboratory evaluation will be sent to evaluate for the above complaints.     Clinical Course as of Aug 17 1840  Cassandra [name] Aug 17, 2017  1712 Patient reassessed with improvement in symptoms on BiPAP.  [PR]  1741 Urinalysis does show concerning features for UTI.  Will send for culture.  Lactate elevated to 7.7 is certainly concerning.  Troponin is elevated at 0.3.  No evidence of STEMI.  Do suspect demand ischemia in the setting of known heart disease and probable sepsis.  Will give slow infusion of IV fluids while patient is on BiPAP.  We will continue nebulizer treatments as she does have a history of bronchitis.  Is currently going to CT scan to evaluate for stone or acute intra-abdominal process.  [PR]  2993 CT imaging is concerning for obstructing left ureteral  stone causing moderate hydro-.  Will call urology.  [PR]  3817 Spoke with urology, Dr. Junious Silk, regarding patient's presentation.  He does agree to come evaluate patient for stent placement.  Will speak with hospitalist for admission to the ICU.  Patient is vital signs  remained stable and improving on BiPAP after IV antibiotics and IV fluids.  [PR]  1841 I was contacted by anesthesia regarding the patient's elevated troponin and concern for ACS.  On my evaluation this elevation of troponin is most likely secondary to demand ischemia in the setting of her underlying urosepsis and I do believe that delaying for cardiac evaluation would be life-threatening and deleterious to the patient's clinical condition.  [PR]    Clinical Course User Index [PR] Merlyn Lot, MD     ____________________________________________   FINAL CLINICAL IMPRESSION(S) / ED DIAGNOSES  Final diagnoses:  Sepsis, due to unspecified organism [facility])  Acute pyelonephritis  Ureterolithiasis  Respiratory distress      NEW MEDICATIONS STARTED DURING THIS VISIT:  This SmartLink is deprecated. Use AVSMEDLIST instead to display the medication list for a patient.   Note:  This document was prepared using Dragon voice recognition software and may include unintentional dictation errors.    Merlyn Lot, MD 08/17/17 [phone]

## 2017-08-17 NOTE — Consult Note (Addendum)
Consult: Sepsis, left distal ureteral stone, hydronephrosis Requested by: Dr. Merlyn Lot  History of Present Illness: Patient is an 83 year old white female with about a 2-day history of left flank pain, low grade fever and dysuria.  Today she has felt very weak and began to have shortness of breath this evening.  She was brought to the emergency department and found to be septic with high lactic acid and white count of 19.  UA with rare bacteria but too numerous to count red cells and white cells and CT scan with a left 4 mm distal obstructing ureteral stone with proximal hydroureteronephrosis and perinephric stranding.  Her troponins are elevated and she has a cardiac history and I discussed that with anesthesia and emergency physician and they felt like she was stabilized enough for stent and important for source control and to drain the left kidney.  Patient denies prior history of stent.  Past Medical History:  Diagnosis Date  . Acquired hypothyroidism 01/06/2014  . Arthritis   . B12 deficiency 02/15/2014  . Benign essential hypertension 01/06/2014  . Breast cancer [facility]) 1982   Right breast cancer - chemotherapy  . CAD (coronary artery disease), native coronary artery 01/06/2014  . Carotid artery calcification   . Chronic airway obstruction, not elsewhere classified 01/06/2014  . Diabetes mellitus without complication ([place])   . Pure hypercholesterolemia 01/06/2014  . Skin cancer   . Sleep apnea    Past Surgical History:  Procedure Laterality Date  . APPENDECTOMY    . AUGMENTATION MAMMAPLASTY Bilateral 1982/redo in 2014  . CARDIAC CATHETERIZATION N/A 04/23/2016   Procedure: Left Heart Cath and Coronary Angiography;  Surgeon: Corey Skains, MD;  Location: [place] CV LAB;  Service: Cardiovascular;  Laterality: N/A;  . CARDIAC CATHETERIZATION Left 04/23/2016   Procedure: Coronary Stent Intervention;  Surgeon: Yolonda Kida, MD;  Location: [place] CV LAB;  Service:  Cardiovascular;  Laterality: Left;  . CAROTID ARTERY ANGIOPLASTY    . CHOLECYSTECTOMY    . CORONARY ANGIOPLASTY WITH STENT PLACEMENT  september 9th 2017  . MASTECTOMY Right 1982  . PACEMAKER INSERTION Left 07/03/2016   Procedure: INSERTION PACEMAKER;  Surgeon: Isaias Cowman, MD;  Location: [facility] ORS;  Service: Cardiovascular;  Laterality: Left;  . SKIN CANCER EXCISION    . TUBAL LIGATION      Home Medications:   (Not in a hospital admission) Allergies: No Known Allergies  Family History  Problem Relation Age of Onset  . Prostate cancer Neg Hx   . Kidney cancer Neg Hx   . Breast cancer Neg Hx    Social History:  reports that she has quit smoking. she has never used smokeless tobacco. She reports that she drinks alcohol. She reports that she does not use drugs.  ROS: A complete review of systems was performed.  All systems are negative except for pertinent findings as noted. Review of Systems  All other systems reviewed and are negative.    Physical Exam:  Vital signs in last 24 hours: Temp:  [97.3 F (36.3 C)-99.3 F (37.4 C)] 99.3 F (37.4 C) (12/16 1607) Pulse Rate:  [82-84] 82 (12/16 1800) Resp:  [17-33] 17 (12/16 1800) BP: (134-198)/(46-79) 142/46 (12/16 1800) SpO2:  [91 %-96 %] 96 % (12/16 1800) Weight:  [81.6 kg (180 lb)] 81.6 kg (180 lb) (12/16 1549) General:  Alert and oriented, No acute distress - on BiPAP HEENT: Normocephalic, atraumatic Cardiovascular: Regular rate and rhythm Lungs: Regular rate and effort Abdomen: Soft, nontender, nondistended, no  abdominal masses Back: No CVA tenderness Extremities: No edema Neurologic: Grossly intact  Laboratory Data:  Results for orders placed or performed during the hospital encounter of 08/17/17 (from the past 24 hour(s))  CBC     Status: Abnormal   Collection Time: 08/17/17  4:05 PM  Result Value Ref Range   WBC 19.6 (H) 3.6 - 11.0 K/uL   RBC 5.32 (H) 3.80 - 5.20 MIL/uL   Hemoglobin 13.2 12.0 - 16.0 g/dL    HCT 42.7 35.0 - 47.0 %   MCV 80.3 80.0 - 100.0 fL   MCH 24.8 (L) 26.0 - 34.0 pg   MCHC 30.8 (L) 32.0 - 36.0 g/dL   RDW 16.4 (H) 11.5 - 14.5 %   Platelets 354 150 - 440 K/uL  Troponin I     Status: Abnormal   Collection Time: 08/17/17  4:05 PM  Result Value Ref Range   Troponin I 0.30 (HH) <0.03 ng/mL  Comprehensive metabolic panel     Status: Abnormal   Collection Time: 08/17/17  4:05 PM  Result Value Ref Range   Sodium 144 135 - 145 mmol/L   Potassium 5.5 (H) 3.5 - 5.1 mmol/L   Chloride 106 101 - 111 mmol/L   CO2 24 22 - 32 mmol/L   Glucose, Bld 169 (H) 65 - 99 mg/dL   BUN 23 (H) 6 - 20 mg/dL   Creatinine, Ser 1.14 (H) 0.44 - 1.00 mg/dL   Calcium 9.6 8.9 - 10.3 mg/dL   Total Protein 8.3 (H) 6.5 - 8.1 g/dL   Albumin 4.1 3.5 - 5.0 g/dL   AST 28 15 - 41 U/L   ALT 12 (L) 14 - 54 U/L   Alkaline Phosphatase 80 38 - 126 U/L   Total Bilirubin 0.6 0.3 - 1.2 mg/dL   GFR calc non Af Amer 44 (L) >60 mL/min   GFR calc Af Amer 50 (L) >60 mL/min   Anion gap 14 5 - 15  Lactic acid, plasma     Status: Abnormal   Collection Time: 08/17/17  4:05 PM  Result Value Ref Range   Lactic Acid, Venous 7.7 (HH) 0.5 - 1.9 mmol/L  Brain natriuretic peptide     Status: Abnormal   Collection Time: 08/17/17  4:05 PM  Result Value Ref Range   B Natriuretic Peptide 527.0 (H) 0.0 - 100.0 pg/mL  Blood gas, venous     Status: Abnormal   Collection Time: 08/17/17  4:38 PM  Result Value Ref Range   pH, Ven 7.40 7.250 - 7.430   pCO2, Ven 37 (L) 44.0 - 60.0 mmHg   pO2, Ven UNABLE TO CALCULATE. 32.0 - 45.0 mmHg   Bicarbonate 22.9 20.0 - 28.0 mmol/L   Acid-base deficit 1.6 0.0 - 2.0 mmol/L   O2 Saturation 59.5 %   Patient temperature 37.0    Collection site VEIN    Sample type VENIPUNCTURE   Urinalysis, Complete w Microscopic     Status: Abnormal   Collection Time: 08/17/17  5:07 PM  Result Value Ref Range   Color, Urine YELLOW (A) YELLOW   APPearance HAZY (A) CLEAR   Specific Gravity, Urine 1.014  1.005 - 1.030   pH 5.0 5.0 - 8.0   Glucose, UA NEGATIVE NEGATIVE mg/dL   Hgb urine dipstick LARGE (A) NEGATIVE   Bilirubin Urine NEGATIVE NEGATIVE   Ketones, ur NEGATIVE NEGATIVE mg/dL   Protein, ur 100 (A) NEGATIVE mg/dL   Nitrite NEGATIVE NEGATIVE   Leukocytes, UA MODERATE (A) NEGATIVE  RBC / HPF TOO NUMEROUS TO COUNT 0 - 5 RBC/hpf   WBC, UA TOO NUMEROUS TO COUNT 0 - 5 WBC/hpf   Bacteria, UA RARE (A) NONE SEEN   Squamous Epithelial / LPF 0-5 (A) NONE SEEN   WBC Clumps PRESENT    Mucus PRESENT    No results found for this or any previous visit (from the past 240 hour(s)). Creatinine: Recent Labs    08/17/17 1605  CREATININE 1.14*   I reviewed the CT scan images.  Impression/Assessment:  Sepsis, left distal ureteral stone and hydronephrosis-  Plan:  I discussed with the patient and her husband the nature, potential benefits, risks and alternatives to cystoscopy, left retrograde pyelogram, left ureteral stent placement and Foley catheter, including side effects of the proposed treatment, the likelihood of the patient achieving the goals of the procedure, and any potential problems that might occur during the procedure or recuperation. All questions answered. Patient and husband elect to proceed.  Patient reports she continues to have some pain and has not seen a stone pass.  We discussed its possible she did pass it and it could be in the bladder or it may have passed in the emergency.   Festus Aloe 08/17/2017, 6:52 PM

## 2017-08-17 NOTE — Consult Note (Signed)
Pharmacy Antibiotic Note  Cassandra [name] is a 83 y.o. female admitted on 08/17/2017 with sepsis.  Pharmacy has been consulted for Zosyn and Vancomycin dosing.Patient received Vancomycin 1g IV x 1 dose and Zosyn 3.375 IV x 1 dose in ED.   Plan: Ke: 0.036   T1/2: 19   Vd: 44.8 DW: 64kg  Start Vancomycin 1g IV every 24 hours with 8 hour stack dosing. Calculated trough at Css is 18.5. Trough ordered prior to 4th dose. Will monitor renal function and adjust dose as needed.   Start Zosyn 3.375 IV EI every 8 hours.   Height: 5\' 2"  (157.5 cm) Weight: 180 lb (81.6 kg) IBW/kg (Calculated) : 50.1  Temp (24hrs), Avg:98.3 F (36.8 C), Min:97.3 F (36.3 C), Max:99.3 F (37.4 C)  Recent Labs  Lab 08/17/17 1605  WBC 19.6*  CREATININE 1.14*  LATICACIDVEN 7.7*    Estimated Creatinine Clearance: 37.7 mL/min (A) (by C-G formula based on SCr of 1.14 mg/dL (H)).    No Known Allergies  Antimicrobials this admission: 12/16 Zosyn  >>  12/16 Vancomycin >>   Dose adjustments this admission:   Microbiology results: 12/16  BCx: pending 12/16  UCx: pending   Thank you for allowing pharmacy to be a part of this patient's care.  Pernell Dupre, PharmD, BCPS Clinical Pharmacist 08/17/2017 7:18 PM

## 2017-08-17 NOTE — Anesthesia Preprocedure Evaluation (Addendum)
Anesthesia Evaluation  Patient identified by MRN, date of birth, ID band Patient awake    Reviewed: Allergy & Precautions, NPO status , Patient's Chart, lab work & pertinent test results  History of Anesthesia Complications Negative for: history of anesthetic complications  Airway Mallampati: III       Dental  (+) Partial Lower, Upper Dentures   Pulmonary sleep apnea and Continuous Positive Airway Pressure Ventilation , COPD, former smoker,  Pt complaining of SOB on admission, now on BIPAP          Cardiovascular hypertension, Pt. on medications + CAD and + Past MI  (-) CHF + pacemaker (-) Valvular Problems/Murmurs     Neuro/Psych neg Seizures    GI/Hepatic   Endo/Other  diabetes, Type 2, Oral Hypoglycemic AgentsHypothyroidism   Renal/GU Renal InsufficiencyRenal disease     Musculoskeletal   Abdominal   Peds  Hematology   Anesthesia Other Findings   Reproductive/Obstetrics                             Anesthesia Physical Anesthesia Plan  ASA: III and emergent  Anesthesia Plan: General   Post-op Pain Management:    Induction: Intravenous  PONV Risk Score and Plan: 3 and Midazolam, Dexamethasone and Ondansetron  Airway Management Planned: Oral ETT  Additional Equipment:   Intra-op Plan:   Post-operative Plan: Possible Post-op intubation/ventilation  Informed Consent: I have reviewed the patients History and Physical, chart, labs and discussed the procedure including the risks, benefits and alternatives for the proposed anesthesia with the patient or authorized representative who has indicated his/her understanding and acceptance.     Plan Discussed with:   Anesthesia Plan Comments: (Pt with SOB and septic. Will possibly need post-op ventilation. This was explained to pt and surgeon and they wish to proceed.)       Anesthesia Quick Evaluation

## 2017-08-17 NOTE — ED Notes (Signed)
Date and time results received: 08/17/17 1659  Test: Trop Critical Value: 0.3  Test: Lactic Critical Value: 7.7  Name of Provider Notified: Dr. Quentin Cornwall  Orders Received? Or Actions Taken?: Acknowledged, code sepsis started

## 2017-08-17 NOTE — ED Triage Notes (Signed)
Pt states that she woke up this am having left flank pain that radiates to her left lower abd, pt is currently labored to breathes, wheezing, reports chills, left lower leg swelling but states that she has past history of surg to that leg. Pt is on blood thinners for a pacemaker placed in oct

## 2017-08-17 NOTE — Progress Notes (Addendum)
RT assisted with patient transport to CT from ER 13 while pt. On V60 BiPap with no complications.

## 2017-08-17 NOTE — Anesthesia Post-op Follow-up Note (Signed)
Anesthesia QCDR form completed.        

## 2017-08-17 NOTE — Progress Notes (Signed)
eLink Physician-Brief Progress Note Patient Name: Cassandra [name] DOB: [birth year] MRN: [id number]   Date of Service  08/17/2017  HPI/Events of Note  83 y/o female admitted with left flank pain; found to have an obstructing left kidney stone with hydronephrosis; now s/p Cystoscopy with left retrograde pyelogram, left ureteral stent placement. Severe lactic acidosis, sepsis and acute hypoxic respiratory failure requiring BiPAP post procedure. IV abx, IV fluids and BiPAP. Already seen by PCCM who will assume care in the ICU. VSS.   eICU Interventions  No new orders.      Intervention Category Evaluation Type: New Patient Evaluation  Lysle Dingwall 08/17/2017, 8:22 PM

## 2017-08-17 NOTE — Progress Notes (Signed)
Patient transferred to PACU while on V60, once in PACU patient taken off and switched over to simple oxygen mask per Anesthesia prior to patient being take back to OR.

## 2017-08-17 NOTE — Anesthesia Procedure Notes (Signed)
Procedure Name: Intubation Date/Time: 08/17/2017 7:19 PM Performed by: Nelda Marseille, CRNA Pre-anesthesia Checklist: Patient identified, Patient being monitored, Timeout performed, Emergency Drugs available and Suction available Patient Re-evaluated:Patient Re-evaluated prior to induction Oxygen Delivery Method: Circle system utilized Preoxygenation: Pre-oxygenation with 100% oxygen Induction Type: IV induction Ventilation: Mask ventilation without difficulty Laryngoscope Size: Mac, 3 and McGraph Grade View: Grade I Tube type: Oral Tube size: 7.0 mm Number of attempts: 1 Airway Equipment and Method: Stylet Placement Confirmation: ETT inserted through vocal cords under direct vision,  positive ETCO2 and breath sounds checked- equal and bilateral Secured at: 21 cm Tube secured with: Tape Dental Injury: Teeth and Oropharynx as per pre-operative assessment  Difficulty Due To: Difficulty was anticipated and Difficult Airway- due to large tongue

## 2017-08-17 NOTE — Progress Notes (Signed)
[facility] called to inquire about the 3 L NS bolus ordered for patient in ED. One liter was documented given. NP ordered labs and will wait for repeat lactic acid to decide if patient does indeed need the additional liters. NS at 75 infusing at this time.

## 2017-08-17 NOTE — Op Note (Addendum)
Preoperative diagnosis: Sepsis, left distal ureteral stone, left hydronephrosis Postoperative diagnosis: Same  Procedure: Cystoscopy with left retrograde pyelogram, left ureteral stent placement  Surgeon: Junious Silk  Anesthesia: General  Indication for procedure: 83 year old with high white count, elevated lactic acid, dirty urine and left distal stone with hydronephrosis.  She was brought for urgent stent.  Findings: Urine cloudy with a lot of debris.  No stone or foreign body in the bladder.  Left retrograde pyelogram-this outlined a single ureter single collecting system unit without obvious filling defect or stone on scout imaging but mild hydroureteronephrosis of the system.  After wire was advanced, a significant amount of debris filled urine drained out of the left system.  There was sluggish but clear reflux from the right.  The fluoroscopy image was flipped such that the patient's left side was on the left side of the screen when facing it similar to a prone position.  This was confirmed by looking over at the right side and also the patient's gallbladder clips were on the right side of the screen when facing it.  Description of procedure: After consent was obtained the patient brought to the operating room.  After adequate anesthesia she was placed in lithotomy position and prepped and draped in the usual sterile fashion.  A timeout was performed to confirm the patient and procedure.  The images were flipped as described above so I looked the left and looked right and move the table left and right and confirmed the image was flipped.  A 5 French open-ended catheter was used to cannulate the left ureteral orifice and left retrograde injection of contrast was performed.  Findings as above.  A sensor wire was then advanced and over the sensor wire a 6 x 24 cm stent was advanced.  The wire was removed with the curl seen in the midpole of the collecting system and a good coil in the bladder.   Stent was draining appropriately and the scope was removed.  A 16 French Foley was placed to max drain the system.  The patient was taken to the ICU in guarded position mainly due to respiratory concerns. She remained hemodynamically stable throughout the case.  Complications: None Blood loss: Minimal Specimens: None  Drains: 6 x 24 cm left ureteral stent

## 2017-08-18 ENCOUNTER — Telehealth: Payer: Self-pay | Admitting: Urology

## 2017-08-18 ENCOUNTER — Other Ambulatory Visit: Payer: Self-pay

## 2017-08-18 ENCOUNTER — Encounter: Payer: Self-pay | Admitting: Urology

## 2017-08-18 DIAGNOSIS — N132 Hydronephrosis with renal and ureteral calculous obstruction: Secondary | ICD-10-CM

## 2017-08-18 DIAGNOSIS — A419 Sepsis, unspecified organism: Secondary | ICD-10-CM

## 2017-08-18 LAB — GLUCOSE, CAPILLARY
GLUCOSE-CAPILLARY: 325 mg/dL — AB (ref 65–99)
GLUCOSE-CAPILLARY: 247 mg/dL — AB (ref 65–99)
GLUCOSE-CAPILLARY: 295 mg/dL — AB (ref 65–99)
Glucose-Capillary: 289 mg/dL — ABNORMAL HIGH (ref 65–99)
Glucose-Capillary: 246 mg/dL — ABNORMAL HIGH (ref 65–99)

## 2017-08-18 LAB — COMPREHENSIVE METABOLIC PANEL
ALT: 12 U/L — ABNORMAL LOW (ref 14–54)
ANION GAP: 6 (ref 5–15)
AST: 22 U/L (ref 15–41)
Albumin: 3 g/dL — ABNORMAL LOW (ref 3.5–5.0)
Alkaline Phosphatase: 57 U/L (ref 38–126)
BUN: 20 mg/dL (ref 6–20)
CHLORIDE: 106 mmol/L (ref 101–111)
CO2: 24 mmol/L (ref 22–32)
Calcium: 8.6 mg/dL — ABNORMAL LOW (ref 8.9–10.3)
Creatinine, Ser: 1.08 mg/dL — ABNORMAL HIGH (ref 0.44–1.00)
GFR calc non Af Amer: 46 mL/min — ABNORMAL LOW (ref >60)
GFR, EST AFRICAN AMERICAN: 54 mL/min — AB (ref >60)
Glucose, Bld: 307 mg/dL — ABNORMAL HIGH (ref 65–99)
POTASSIUM: 4.4 mmol/L (ref 3.5–5.1)
SODIUM: 136 mmol/L (ref 135–145)
Total Bilirubin: 0.8 mg/dL (ref 0.3–1.2)
Total Protein: 6.7 g/dL (ref 6.5–8.1)

## 2017-08-18 LAB — CBC
HCT: 34.2 % — ABNORMAL LOW (ref 35.0–47.0)
HEMOGLOBIN: 10.9 g/dL — AB (ref 12.0–16.0)
MCH: 25.4 pg — ABNORMAL LOW (ref 26.0–34.0)
MCHC: 31.9 g/dL — ABNORMAL LOW (ref 32.0–36.0)
MCV: 79.5 fL — AB (ref 80.0–100.0)
Platelets: 230 10*3/uL (ref 150–440)
RBC: 4.31 MIL/uL (ref 3.80–5.20)
RDW: 16.3 % — ABNORMAL HIGH (ref 11.5–14.5)
WBC: 17.1 10*3/uL — ABNORMAL HIGH (ref 3.6–11.0)

## 2017-08-18 LAB — LACTIC ACID, PLASMA
LACTIC ACID, VENOUS: 2.2 mmol/L — AB (ref 0.5–1.9)
LACTIC ACID, VENOUS: 2.2 mmol/L — AB (ref 0.5–1.9)

## 2017-08-18 LAB — TROPONIN I
TROPONIN I: 0.68 ng/mL — AB (ref <0.03)
TROPONIN I: 0.53 ng/mL — AB (ref <0.03)

## 2017-08-18 LAB — PROCALCITONIN: Procalcitonin: 4.19 ng/mL

## 2017-08-18 LAB — MAGNESIUM: Magnesium: 2.1 mg/dL (ref 1.7–2.4)

## 2017-08-18 MED ORDER — PNEUMOCOCCAL VAC POLYVALENT 25 MCG/0.5ML IJ INJ
0.5000 mL | INJECTION | INTRAMUSCULAR | Status: AC
  Administered 2017-08-20: 0.5 mL via INTRAMUSCULAR
  Filled 2017-08-18: qty 0.5

## 2017-08-18 MED ORDER — INSULIN ASPART 100 UNIT/ML ~~LOC~~ SOLN
5.0000 [IU] | Freq: Three times a day (TID) | SUBCUTANEOUS | Status: DC
  Administered 2017-08-18 – 2017-08-19 (×3): 5 [IU] via SUBCUTANEOUS
  Filled 2017-08-18 (×3): qty 1

## 2017-08-18 MED ORDER — INFLUENZA VAC SPLIT HIGH-DOSE 0.5 ML IM SUSY
0.5000 mL | PREFILLED_SYRINGE | INTRAMUSCULAR | Status: DC
  Filled 2017-08-18: qty 0.5

## 2017-08-18 MED ORDER — SODIUM CHLORIDE 0.9 % IV BOLUS (SEPSIS)
1000.0000 mL | Freq: Once | INTRAVENOUS | Status: AC
  Administered 2017-08-18: 1000 mL via INTRAVENOUS

## 2017-08-18 MED ORDER — DEXTROSE 5 % IV SOLN
1.0000 g | Freq: Every day | INTRAVENOUS | Status: DC
  Administered 2017-08-18 – 2017-08-19 (×2): 1 g via INTRAVENOUS
  Filled 2017-08-18 (×3): qty 10

## 2017-08-18 NOTE — Progress Notes (Signed)
1 Day Post-Op Subjective: Patient reports flank pain resolved. Feels better.   Objective: Vital signs in last 24 hours: Temp:  [97.3 F (36.3 C)-100.4 F (38 C)] 98.1 F (36.7 C) (12/17 0800) Pulse Rate:  [60-86] 71 (12/17 1000) Resp:  [14-33] 21 (12/17 1000) BP: (98-198)/(46-116) 117/58 (12/17 1000) SpO2:  [91 %-100 %] 92 % (12/17 1000) FiO2 (%):  [40 %] 40 % (12/16 2020) Weight:  [81.6 kg (180 lb)-85.8 kg (189 lb 2.5 oz)] 85.8 kg (189 lb 2.5 oz) (12/17 0500)  Intake/Output from previous day: 12/16 0701 - 12/17 0700 In: 1145 [I.V.:645; IV Piggyback:500] Out: 1251 [Urine:1250; Blood:1] Intake/Output this shift: Total I/O In: 550 [Other:550] Out: -   Physical Exam:  NAD, alert and oriented x 3 On [place] Abd - soft, NT GU - urine clear   Lab Results: Recent Labs    08/17/17 1605 08/17/17 2015 08/18/17 0754  HGB 13.2 11.0* 10.9*  HCT 42.7 34.5* 34.2*   BMET Recent Labs    08/17/17 2015 08/18/17 0754  NA 137 136  K 4.0 4.4  CL 106 106  CO2 23 24  GLUCOSE 200* 307*  BUN 23* 20  CREATININE 1.03* 1.08*  CALCIUM 8.2* 8.6*   Recent Labs    08/17/17 2015  INR 1.22   No results for input(s): LABURIN in the last 72 hours. Results for orders placed or performed during the hospital encounter of 08/17/17  Blood culture (single)     Status: None (Preliminary result)   Collection Time: 08/17/17  4:05 PM  Result Value Ref Range Status   Specimen Description BLOOD LT ARM  Final   Special Requests   Final    BOTTLES DRAWN AEROBIC AND ANAEROBIC Blood Culture results may not be optimal due to an inadequate volume of blood received in culture bottles   Culture NO GROWTH < 24 HOURS  Final   Report Status PENDING  Incomplete  Blood Culture (routine x 2)     Status: None (Preliminary result)   Collection Time: 08/17/17  4:39 PM  Result Value Ref Range Status   Specimen Description BLOOD RT [facility]  Final   Special Requests   Final    BOTTLES DRAWN AEROBIC AND ANAEROBIC Blood  Culture results may not be optimal due to an inadequate volume of blood received in culture bottles   Culture NO GROWTH < 24 HOURS  Final   Report Status PENDING  Incomplete  Blood Culture (routine x 2)     Status: None (Preliminary result)   Collection Time: 08/17/17  4:52 PM  Result Value Ref Range Status   Specimen Description BLOOD RT [facility]  Final   Special Requests   Final    BOTTLES DRAWN AEROBIC AND ANAEROBIC Blood Culture adequate volume   Culture NO GROWTH < 24 HOURS  Final   Report Status PENDING  Incomplete  MRSA PCR Screening     Status: None   Collection Time: 08/17/17  8:33 PM  Result Value Ref Range Status   MRSA by PCR NEGATIVE NEGATIVE Final    Comment:        The GeneXpert MRSA Assay (FDA approved for NASAL specimens only), is one component of a comprehensive MRSA colonization surveillance program. It is not intended to diagnose MRSA infection nor to guide or monitor treatment for MRSA infections.     Studies/Results: Dg Chest Portable 1 View  Result Date: 08/17/2017 CLINICAL DATA:  Left flank pain radiating to the left lower abdomen. EXAM: PORTABLE CHEST  1 VIEW COMPARISON:  08/02/2016 FINDINGS: Stable borderline cardiomegaly with aortic atherosclerosis. Left-sided pacemaker apparatus with right atrial and right ventricular leads. Mild chronic interstitial prominence some of which appear to represent areas of interstitial fibrosis but without alveolar airspace consolidations or CHF. No pneumothorax. No significant pleural effusions. IMPRESSION: Stable borderline cardiomegaly with aortic atherosclerosis. Chronic diffuse prominence pulmonary interstitium likely representing chronic interstitial lung disease. No active pulmonary disease. Electronically Signed   By: Ashley Royalty M.D.   On: 08/17/2017 16:49   Ct Renal Stone Study  Result Date: 08/17/2017 CLINICAL DATA:  Flank pain.  Evaluate for kidney stone. EXAM: CT ABDOMEN AND PELVIS WITHOUT CONTRAST TECHNIQUE:  Multidetector CT imaging of the abdomen and pelvis was performed following the standard protocol without IV contrast. COMPARISON:  Ultrasound of the abdomen 07/05/2009 FINDINGS: Lower chest: No acute abnormality. Hepatobiliary: No focal liver abnormality is seen. Status post cholecystectomy. No biliary dilatation. Pancreas: Unremarkable. No pancreatic ductal dilatation or surrounding inflammatory changes. Spleen: Normal in size without focal abnormality. Adrenals/Urinary Tract: The adrenal glands appear normal. Normal appearance of the right kidney. Left-sided perinephric fat stranding and hydronephrosis is identified. Stone within the distal left ureter measures 4 mm, image 72 of series 2. Bladder normal. Stomach/Bowel: Stomach is within normal limits. Appendix surgically absent. No evidence of bowel wall thickening, distention, or inflammatory changes. Distal colonic diverticula noted. Vascular/Lymphatic: Aortic atherosclerosis. No aneurysm. No abdominal or pelvic adenopathy. No inguinal adenopathy. Reproductive: Status post hysterectomy. No adnexal masses. Other: No abdominal wall hernia or abnormality. No abdominopelvic ascites. Musculoskeletal: Degenerative disc disease identified within the lumbar spine. There is a compression fracture involving the L1 vertebra which appears new from the most recent previous dated 05/18/2013. IMPRESSION: 1. Distal left ureteral calculus measures 4 mm. This results in left-sided hydronephrosis and perinephric fat stranding. 2. Age-indeterminate L1 compression fracture. 3.  Aortic Atherosclerosis (ICD10-I70.0). Electronically Signed   By: Kerby Moors M.D.   On: 08/17/2017 18:12    Assessment/Plan:  sepsis picture - lactate, troponin, WBC improved. Follow-up on urine Cx. Transition to po abx. Can d/c foley when pt ambulatory.   Left ureteral stone - discussed need for ureteroscopy with patient and husband. Discussed it's possible the stone passed, but there was residual  dilation and thick urine that drained from left side with stent placement.   GU will sign off. F/u message sent to [facility] for f/u and I discussed again f/u with pt and husband.    LOS: 1 day   Festus Aloe 08/18/2017, 10:43 AM

## 2017-08-18 NOTE — Progress Notes (Signed)
[facility] for electrolyte and glucose management    Pharmacy consulted for electrolyte and glucose management for 83 yo female admitted with hydronephrosis. Patient is orderd furosemide 40mg  daily. Patient received decadron 5mg  on 12/16. Patient currently ordered SSI and Lantus 50 Units QHs.   Plan:  Will recheck electrolytes with am labs.  Will continue with current insulin regimen. Will follow blood glucose and adjust as clinically indicated.   No Known Allergies  Patient Measurements: Height: 5\' 2"  (157.5 cm) Weight: 189 lb 2.5 oz (85.8 kg) IBW/kg (Calculated) : 50.1  Vital Signs: Temp: 98 F (36.7 C) (12/17 1431) Temp Source: Oral (12/17 1431) BP: 146/44 (12/17 1431) Pulse Rate: 68 (12/17 1431) Intake/Output from previous day: 12/16 0701 - 12/17 0700 In: 2045 [I.V.:1545; IV Piggyback:500] Out: 1251 [Urine:1250; Blood:1] Intake/Output from this shift: No intake/output data recorded.  Labs: Recent Labs    08/17/17 1605 08/17/17 2015 08/18/17 0754  WBC 19.6* 17.2* 17.1*  HGB 13.2 11.0* 10.9*  HCT 42.7 34.5* 34.2*  PLT 354 254 230  APTT  --  33  --   CREATININE 1.14* 1.03* 1.08*  MG  --  1.5* 2.1  PHOS  --  4.0  --   ALBUMIN 4.1 3.1* 3.0*  PROT 8.3* 6.3* 6.7  AST 28 25 22   ALT 12* 11* 12*  ALKPHOS 80 57 57  BILITOT 0.6 0.9 0.8   Estimated Creatinine Clearance: 40.8 mL/min (A) (by C-G formula based on SCr of 1.08 mg/dL (H)).  Pharmacy will continue to monitor and adjust per consult.   Maki Sweetser L 08/18/2017,7:13 PM

## 2017-08-18 NOTE — Progress Notes (Signed)
Patient did not have to have additional boluses of NS. Lactic acid had come down to 2.4 then 2.2. Continue on ABX. No complaints of pain or discomfort. Tolerated po medications without incident. On NS at 100, rate increased from 75 to 100. Foley patent and draining and now straw colored urine, less bloody.

## 2017-08-18 NOTE — Progress Notes (Signed)
Inpatient Diabetes Program Recommendations  AACE/ADA: New Consensus Statement on Inpatient Glycemic Control (2015)  Target Ranges:  Prepandial:   less than 140 mg/dL      Peak postprandial:   less than 180 mg/dL (1-2 hours)      Critically ill patients:  140 - 180 mg/dL   Lab Results  Component Value Date   GLUCAP 289 (H) 08/18/2017   HGBA1C 7.4 (H) 06/07/2016   Review of Glycemic Control  Diabetes history: DM 2 Outpatient Diabetes medications: Lantus 50 units, Humalog 12 units BID with meals, Metformin 1000 mg tid with meals Current orders for Inpatient glycemic control: Lantus 50 units, Novolog Sensitive Correction 0-9 units tid  Inpatient Diabetes Program Recommendations:    Glucose 289 mg/dl. Patient received Decadron 5 mg yesterday. Prior to steroid dose glucose levels were  169/176. Patient currently on home dose of Lantus. If glucose remains elevated (should trend down after steroid dose) consider low dose Novolog meal coverage. Watch with current regimen for now.  Thanks,  Tama Headings RN, MSN, [facility] Inpatient Diabetes Coordinator Team Pager [phone] (8a-5p);

## 2017-08-18 NOTE — Telephone Encounter (Signed)
-----   Message from Festus Aloe, MD sent at 08/18/2017 10:47 AM EST ----- Needs f/u in about 2 weeks to arrange ureteroscopy. Pt is s/p left ureteral stent for left distal stone and sepsis.

## 2017-08-18 NOTE — Telephone Encounter (Signed)
App made ° ° °[name]  °

## 2017-08-18 NOTE — Anesthesia Postprocedure Evaluation (Signed)
Anesthesia Post Note  Patient: Cassandra [name]  Procedure(s) Performed: CYSTOSCOPY WITH RETROGRADE PYELOGRAM/URETERAL STENT PLACEMENT (Left Ureter)  Patient location during evaluation: ICU Anesthesia Type: General Post-procedure mental status: patient sleeping. Pain management: pain level controlled Vital Signs Assessment: post-procedure vital signs reviewed and stable Respiratory status: spontaneous breathing Cardiovascular status: blood pressure returned to baseline Postop Assessment: no apparent nausea or vomiting Anesthetic complications: no     Last Vitals:  Vitals:   08/18/17 0500 08/18/17 0600  BP: (!) 158/75 (!) 163/64  Pulse: 69 83  Resp: 15 (!) 25  Temp:    SpO2: 97% 91%    Last Pain:  Vitals:   08/18/17 0000  TempSrc: Axillary  PainSc: Asleep                 Hedda Slade

## 2017-08-18 NOTE — Progress Notes (Addendum)
[facility] [place] NAME: Cassandra [name]    MR#:  [id number]  DATE OF BIRTH:  01-29-1933  SUBJECTIVE:  CHIEF COMPLAINT:   Chief Complaint  Patient presents with  . Flank Pain  . Abdominal Pain  . Shortness of Breath  . Leg Swelling   The patient feels much better no complaints. REVIEW OF SYSTEMS:  Review of Systems  Constitutional: Negative for chills, fever and malaise/fatigue.  HENT: Negative for sore throat.   Eyes: Negative for blurred vision and double vision.  Respiratory: Negative for cough, hemoptysis, shortness of breath, wheezing and stridor.   Cardiovascular: Negative for chest pain, palpitations, orthopnea and leg swelling.  Gastrointestinal: Negative for abdominal pain, blood in stool, diarrhea, melena, nausea and vomiting.  Genitourinary: Negative for dysuria, flank pain and hematuria.  Musculoskeletal: Negative for back pain and joint pain.  Neurological: Negative for dizziness, sensory change, focal weakness, seizures, loss of consciousness, weakness and headaches.  Endo/Heme/Allergies: Negative for polydipsia.  Psychiatric/Behavioral: Negative for depression. The patient is not nervous/anxious.     DRUG ALLERGIES:  No Known Allergies VITALS:  Blood pressure (!) 146/44, pulse 68, temperature 98 F (36.7 C), temperature source Oral, resp. rate 12, height 5\' 2"  (1.575 m), weight 189 lb 2.5 oz (85.8 kg), SpO2 96 %. PHYSICAL EXAMINATION:  Physical Exam  Constitutional: She is oriented to person, place, and time and well-developed, well-nourished, and in no distress.  HENT:  Head: Normocephalic.  Mouth/Throat: Oropharynx is clear and moist.  Eyes: Conjunctivae and EOM are normal. Pupils are equal, round, and reactive to light. No scleral icterus.  Neck: Normal range of motion. Neck supple. No JVD present. No tracheal deviation present.  Cardiovascular: Normal rate, regular rhythm and normal heart sounds. Exam  reveals no gallop.  No murmur heard. Pulmonary/Chest: Effort normal and breath sounds normal. No respiratory distress. She has no wheezes. She has no rales.  Abdominal: Soft. Bowel sounds are normal. She exhibits no distension. There is no tenderness. There is no rebound.  Musculoskeletal: Normal range of motion. She exhibits no edema or tenderness.  Neurological: She is alert and oriented to person, place, and time. No cranial nerve deficit.  Skin: No rash noted. No erythema.  Psychiatric: Affect normal.   LABORATORY PANEL:  Female CBC Recent Labs  Lab 08/18/17 0754  WBC 17.1*  HGB 10.9*  HCT 34.2*  PLT 230   ------------------------------------------------------------------------------------------------------------------ Chemistries  Recent Labs  Lab 08/18/17 0754  NA 136  K 4.4  CL 106  CO2 24  GLUCOSE 307*  BUN 20  CREATININE 1.08*  CALCIUM 8.6*  MG 2.1  AST 22  ALT 12*  ALKPHOS 57  BILITOT 0.8   RADIOLOGY:  Dg Chest Portable 1 View  Result Date: 08/17/2017 CLINICAL DATA:  Left flank pain radiating to the left lower abdomen. EXAM: PORTABLE CHEST 1 VIEW COMPARISON:  08/02/2016 FINDINGS: Stable borderline cardiomegaly with aortic atherosclerosis. Left-sided pacemaker apparatus with right atrial and right ventricular leads. Mild chronic interstitial prominence some of which appear to represent areas of interstitial fibrosis but without alveolar airspace consolidations or CHF. No pneumothorax. No significant pleural effusions. IMPRESSION: Stable borderline cardiomegaly with aortic atherosclerosis. Chronic diffuse prominence pulmonary interstitium likely representing chronic interstitial lung disease. No active pulmonary disease. Electronically Signed   By: Ashley Royalty M.D.   On: 08/17/2017 16:49   Ct Renal Stone Study  Result Date: 08/17/2017 CLINICAL DATA:  Flank pain.  Evaluate for kidney stone. EXAM: CT  ABDOMEN AND PELVIS WITHOUT CONTRAST TECHNIQUE: Multidetector CT  imaging of the abdomen and pelvis was performed following the standard protocol without IV contrast. COMPARISON:  Ultrasound of the abdomen 07/05/2009 FINDINGS: Lower chest: No acute abnormality. Hepatobiliary: No focal liver abnormality is seen. Status post cholecystectomy. No biliary dilatation. Pancreas: Unremarkable. No pancreatic ductal dilatation or surrounding inflammatory changes. Spleen: Normal in size without focal abnormality. Adrenals/Urinary Tract: The adrenal glands appear normal. Normal appearance of the right kidney. Left-sided perinephric fat stranding and hydronephrosis is identified. Stone within the distal left ureter measures 4 mm, image 72 of series 2. Bladder normal. Stomach/Bowel: Stomach is within normal limits. Appendix surgically absent. No evidence of bowel wall thickening, distention, or inflammatory changes. Distal colonic diverticula noted. Vascular/Lymphatic: Aortic atherosclerosis. No aneurysm. No abdominal or pelvic adenopathy. No inguinal adenopathy. Reproductive: Status post hysterectomy. No adnexal masses. Other: No abdominal wall hernia or abnormality. No abdominopelvic ascites. Musculoskeletal: Degenerative disc disease identified within the lumbar spine. There is a compression fracture involving the L1 vertebra which appears new from the most recent previous dated 05/18/2013. IMPRESSION: 1. Distal left ureteral calculus measures 4 mm. This results in left-sided hydronephrosis and perinephric fat stranding. 2. Age-indeterminate L1 compression fracture. 3.  Aortic Atherosclerosis (ICD10-I70.0). Electronically Signed   By: Kerby Moors M.D.   On: 08/17/2017 18:12   ASSESSMENT AND PLAN:   Sepsis due to UTI (urinary tract infection) Still leukocytosis. Continue IV Rocephin and follow-up CBC, urine culture and blood culture.  Hydronephrosis due to Left ureteral stone S/p Cystoscopy with left retrograde pyelogram, left ureteral stent placement  Severe lactic acidosis  secondary to sepsis.  Improved. Acute respiratory failure with hypoxia due to above..  Off BiPAP and off oxygen.  Borderline elevated troponin, due to above. CAD. Continue Plavix to prevent stent thrombosis and discontinue aspirin to minimize bleeding per Dr. Saralyn Pilar.  Continue Crestor.  Chronic A. fib.  Continue Eliquis.  Diabetes.  Continue Lantus 50 units at bedtime with sliding scale.  Add NovoLog 5 units before meals.  OSA.  CPAP at night.  All the records are reviewed and case discussed with Care Management/Social Worker. Management plans discussed with the patient, her husband and they are in agreement.  CODE STATUS: Full Code  TOTAL TIME TAKING CARE OF THIS PATIENT: 37 minutes.   More than 50% of the time was spent in counseling/coordination of care: YES  POSSIBLE D/C IN 2 DAYS, DEPENDING ON CLINICAL CONDITION.   Demetrios Loll M.D on 08/18/2017 at 3:05 PM  Between 7am to 6pm - Pager - [phone]  After 6pm go to [email or web address] - [facility]

## 2017-08-18 NOTE — Consult Note (Signed)
Pharmacy Antibiotic Note  Cassandra [name] is a 83 y.o. female admitted on 08/17/2017 with possible urosepsis. Pharmacy has been consulted for Ceftriaxone dosing.  Plan: Will initiate ceftriaxone 1g IV Q24hr.   Height: 5\' 2"  (157.5 cm) Weight: 189 lb 2.5 oz (85.8 kg) IBW/kg (Calculated) : 50.1  Temp (24hrs), Avg:98.5 F (36.9 C), Min:97.3 F (36.3 C), Max:100.4 F (38 C)  Recent Labs  Lab 08/17/17 1605 08/17/17 2015 08/18/17 0215 08/18/17 0754  WBC 19.6* 17.2*  --  17.1*  CREATININE 1.14* 1.03*  --  1.08*  LATICACIDVEN 7.7* 2.4* 2.2*  --     Estimated Creatinine Clearance: 40.8 mL/min (A) (by C-G formula based on SCr of 1.08 mg/dL (H)).    No Known Allergies  Antimicrobials this admission: Zosyn  12/16 >> 12/17 Vancomycin 12/16 >> 12/17 Ceftriaxone 12/17 >>   Dose adjustments this admission: N/A  Microbiology results: 12/16  BCx: no growth < 24 hours  12/16  UCx: pending  12/16 MRSA PCR: negative   Thank you for allowing pharmacy to be a part of this patient's care.  Cassandra [name] L 08/18/2017 11:36 AM

## 2017-08-18 NOTE — Consult Note (Signed)
[facility]  CARDIOLOGY CONSULT NOTE  Patient ID: Cassandra [name] MRN: [id number] DOB/AGE: [birth year] 83 y.o.  Admit date: 08/17/2017 Referring Physician Bridgett Larsson Primary Physician [facility] Primary Cardiologist Fath Reason for Consultation elevated troponin  HPI: 83 year old female referred for evaluation of elevated troponin.  She was admitted on 08/17/2017 with acute left flank pain and respiratory distress.  Patient was found to have elevated white count, urinary tract infection, hydronephrosis secondary to left ureteral stone.  The patient underwent cystoscopic with left ureteral stent.  Lab work revealed borderline elevated troponin 0 0.68 and 0.53 in the absence of chest pain or new ECG changes.  The patient has known history of coronary disease, status post DES LAD and chronically occluded RCA 04/23/2016.  Patient has sick sinus syndrome, status post pacemaker 07/03/2016.  The patient reports she has been doing well from a cardiovascular perspective, without chest pain, shortness of breath, palpitations or heart racing.  Review of systems complete and found to be negative unless listed above     Past Medical History:  Diagnosis Date  . Acquired hypothyroidism 01/06/2014  . Arthritis   . B12 deficiency 02/15/2014  . Benign essential hypertension 01/06/2014  . Breast cancer [facility]) 1982   Right breast cancer - chemotherapy  . CAD (coronary artery disease), native coronary artery 01/06/2014  . Carotid artery calcification   . Chronic airway obstruction, not elsewhere classified 01/06/2014  . Diabetes mellitus without complication ([place])   . Pure hypercholesterolemia 01/06/2014  . Skin cancer   . Sleep apnea     Past Surgical History:  Procedure Laterality Date  . APPENDECTOMY    . AUGMENTATION MAMMAPLASTY Bilateral 1982/redo in 2014  . CARDIAC CATHETERIZATION N/A 04/23/2016   Procedure: Left Heart Cath and Coronary Angiography;  Surgeon: Corey Skains, MD;  Location: [place] [facility];   Service: Cardiovascular;  Laterality: N/A;  . CARDIAC CATHETERIZATION Left 04/23/2016   Procedure: Coronary Stent Intervention;  Surgeon: Yolonda Kida, MD;  Location: [place] [facility];  Service: Cardiovascular;  Laterality: Left;  . CAROTID ARTERY ANGIOPLASTY    . CHOLECYSTECTOMY    . CORONARY ANGIOPLASTY WITH STENT PLACEMENT  september 9th 2017  . CYSTOSCOPY W/ URETERAL STENT PLACEMENT Left 08/17/2017   Procedure: CYSTOSCOPY WITH RETROGRADE PYELOGRAM/URETERAL STENT PLACEMENT;  Surgeon: Festus Aloe, MD;  Location: [facility] ORS;  Service: Urology;  Laterality: Left;  [name] MASTECTOMY Right 1982  . PACEMAKER INSERTION Left 07/03/2016   Procedure: INSERTION PACEMAKER;  Surgeon: Isaias Cowman, MD;  Location: [facility] ORS;  Service: Cardiovascular;  Laterality: Left;  . SKIN CANCER EXCISION    . TUBAL LIGATION      Medications Prior to Admission  Medication Sig Dispense Refill Last Dose  . apixaban (ELIQUIS) 5 MG TABS tablet Take 1 tablet (5 mg total) by mouth 2 (two) times daily. 60 tablet 0 08/17/2017 at 0800  . aspirin EC 81 MG tablet Take 81 mg by mouth daily.   08/17/2017 at 0800  . Cholecalciferol (VITAMIN D3) 2000 units TABS Take 2,000 Units by mouth daily.   08/17/2017 at 0800  . citalopram (CELEXA) 40 MG tablet TAKE ONE TABLET BY MOUTH EVERY NIGHT AT BEDTIME.   08/16/2017 at 2000  . clopidogrel (PLAVIX) 75 MG tablet Take 1 tablet (75 mg total) by mouth daily with breakfast. 30 tablet 0 08/17/2017 at 0800  . diltiazem (CARDIZEM CD) 180 MG 24 hr capsule Take 1 capsule (180 mg total) by mouth daily. 30 capsule 0 08/17/2017 at 0800  . furosemide (  LASIX) 40 MG tablet Take 40 mg by mouth daily.   08/17/2017 at 0800  . HUMALOG KWIKPEN 100 UNIT/ML KiwkPen Inject 12 Units into the skin 2 (two) times daily before a meal. *Use as directed per sliding scale*   08/17/2017 at 0800  . insulin glargine (LANTUS) 100 UNIT/ML injection Inject 0.42 mLs (42 Units total) into the skin at bedtime.  (Patient taking differently: Inject 50 Units into the skin at bedtime. ) 0.42 mL 0 08/16/2017 at 2000  . levothyroxine (SYNTHROID, LEVOTHROID) 112 MCG tablet Take 1 tablet (112 mcg total) by mouth daily before breakfast. 30 tablet 0 08/17/2017 at 0800  . metFORMIN (GLUCOPHAGE-XR) 500 MG 24 hr tablet Take 2 tablets (1,000 mg total) by mouth 2 (two) times daily. Please start this on 8/24 (Patient taking differently: Take 1,000 mg by mouth 3 (three) times daily. Pl) 60 tablet 0 08/17/2017 at 0800  . nitroGLYCERIN (NITROSTAT) 0.4 MG SL tablet Place 1 tablet (0.4 mg total) under the tongue every 5 (five) minutes as needed for chest pain. 90 tablet 0 prn at prn  . ropinirole (REQUIP) 5 MG tablet Take 5 mg by mouth at bedtime.    08/16/2017 at 2000  . rosuvastatin (CRESTOR) 10 MG tablet Take 10 mg by mouth at bedtime.    08/16/2017 at 2000  . colchicine 0.6 MG tablet Take 1 tablet (0.6 mg total) by mouth daily. (Patient not taking: Reported on 06/27/2016) 60 tablet 0 Not Taking at Unknown time  . lisinopril (PRINIVIL,ZESTRIL) 2.5 MG tablet Take 1 tablet (2.5 mg total) by mouth 2 (two) times daily. (Patient not taking: Reported on 08/17/2017) 30 tablet 0 Not Taking at Unknown time   Social History   Socioeconomic History  . Marital status: Married    Spouse name: Not on file  . Number of children: Not on file  . Years of education: Not on file  . Highest education level: Not on file  Social Needs  . Financial resource strain: Not on file  . Food insecurity - worry: Not on file  . Food insecurity - inability: Not on file  . Transportation needs - medical: Not on file  . Transportation needs - non-medical: Not on file  Occupational History  . Not on file  Tobacco Use  . Smoking status: Former Research scientist (life sciences)  . Smokeless tobacco: Never Used  Substance and Sexual Activity  . Alcohol use: Yes    Alcohol/week: 0.0 oz    Comment: occasionally  . Drug use: No  . Sexual activity: Not on file  Other Topics  Concern  . Not on file  Social History Narrative  . Not on file    Family History  Problem Relation Age of Onset  . Prostate cancer Neg Hx   . Kidney cancer Neg Hx   . Breast cancer Neg Hx       Review of systems complete and found to be negative unless listed above      PHYSICAL EXAM  General: Well developed, well nourished, in no acute distress HEENT:  Normocephalic and atramatic Neck:  No JVD.  Lungs: Clear bilaterally to auscultation and percussion. Heart: HRRR . Normal S1 and S2 without gallops or murmurs.  Abdomen: Bowel sounds are positive, abdomen soft and non-tender  Msk:  Back normal, normal gait. Normal strength and tone for age. Extremities: No clubbing, cyanosis or edema.   Neuro: Alert and oriented X 3. Psych:  Good affect, responds appropriately  Labs:   Lab Results  Component  Value Date   WBC 17.1 (H) 08/18/2017   HGB 10.9 (L) 08/18/2017   HCT 34.2 (L) 08/18/2017   MCV 79.5 (L) 08/18/2017   PLT 230 08/18/2017    Recent Labs  Lab 08/18/17 0754  NA 136  K 4.4  CL 106  CO2 24  BUN 20  CREATININE 1.08*  CALCIUM 8.6*  PROT 6.7  BILITOT 0.8  ALKPHOS 57  ALT 12*  AST 22  GLUCOSE 307*   Lab Results  Component Value Date   TROPONINI 0.53 (HH) 08/18/2017    Lab Results  Component Value Date   CHOL 87 06/15/2016   Lab Results  Component Value Date   HDL 29 (L) 06/15/2016   Lab Results  Component Value Date   LDLCALC 28 06/15/2016   Lab Results  Component Value Date   TRIG 150 (H) 06/15/2016   Lab Results  Component Value Date   CHOLHDL 3.0 06/15/2016   No results found for: LDLDIRECT    Radiology: Ct Head Wo Contrast  Result Date: 08/05/2017 CLINICAL DATA:  Headache and dizziness for the last several years. Symptoms are intermittent. EXAM: CT HEAD WITHOUT CONTRAST TECHNIQUE: Contiguous axial images were obtained from the base of the skull through the vertex without intravenous contrast. COMPARISON:  06/06/2016 FINDINGS: Brain:  Minimal age related volume loss. No evidence of old or acute small or large vessel infarction. No mass lesion, hemorrhage, hydrocephalus or extra-axial collection. Vascular: There is atherosclerotic calcification of the major vessels at the base of the brain. Skull: Normal Sinuses/Orbits: Clear/ normal Other: None IMPRESSION: No cause of the presenting symptoms is identified. Normal exam for age. Electronically Signed   By: Nelson Chimes M.D.   On: 08/05/2017 08:02   Dg Chest Portable 1 View  Result Date: 08/17/2017 CLINICAL DATA:  Left flank pain radiating to the left lower abdomen. EXAM: PORTABLE CHEST 1 VIEW COMPARISON:  08/02/2016 FINDINGS: Stable borderline cardiomegaly with aortic atherosclerosis. Left-sided pacemaker apparatus with right atrial and right ventricular leads. Mild chronic interstitial prominence some of which appear to represent areas of interstitial fibrosis but without alveolar airspace consolidations or CHF. No pneumothorax. No significant pleural effusions. IMPRESSION: Stable borderline cardiomegaly with aortic atherosclerosis. Chronic diffuse prominence pulmonary interstitium likely representing chronic interstitial lung disease. No active pulmonary disease. Electronically Signed   By: Ashley Royalty M.D.   On: 08/17/2017 16:49   Ct Renal Stone Study  Result Date: 08/17/2017 CLINICAL DATA:  Flank pain.  Evaluate for kidney stone. EXAM: CT ABDOMEN AND PELVIS WITHOUT CONTRAST TECHNIQUE: Multidetector CT imaging of the abdomen and pelvis was performed following the standard protocol without IV contrast. COMPARISON:  Ultrasound of the abdomen 07/05/2009 FINDINGS: Lower chest: No acute abnormality. Hepatobiliary: No focal liver abnormality is seen. Status post cholecystectomy. No biliary dilatation. Pancreas: Unremarkable. No pancreatic ductal dilatation or surrounding inflammatory changes. Spleen: Normal in size without focal abnormality. Adrenals/Urinary Tract: The adrenal glands appear  normal. Normal appearance of the right kidney. Left-sided perinephric fat stranding and hydronephrosis is identified. Stone within the distal left ureter measures 4 mm, image 72 of series 2. Bladder normal. Stomach/Bowel: Stomach is within normal limits. Appendix surgically absent. No evidence of bowel wall thickening, distention, or inflammatory changes. Distal colonic diverticula noted. Vascular/Lymphatic: Aortic atherosclerosis. No aneurysm. No abdominal or pelvic adenopathy. No inguinal adenopathy. Reproductive: Status post hysterectomy. No adnexal masses. Other: No abdominal wall hernia or abnormality. No abdominopelvic ascites. Musculoskeletal: Degenerative disc disease identified within the lumbar spine. There is a compression fracture  involving the L1 vertebra which appears new from the most recent previous dated 05/18/2013. IMPRESSION: 1. Distal left ureteral calculus measures 4 mm. This results in left-sided hydronephrosis and perinephric fat stranding. 2. Age-indeterminate L1 compression fracture. 3.  Aortic Atherosclerosis (ICD10-I70.0). Electronically Signed   By: Kerby Moors M.D.   On: 08/17/2017 18:12    EKG: Normal sinus rhythm, without acute ischemic ST-T wave changes  ASSESSMENT AND PLAN:   1.  Borderline elevated troponin, in the absence of chest pain or ECG changes, in the setting of urosepsis, hydronephrosis, ureteral stone, status post left ureteral stent 2.  Known CAD, status post DES LAD, without chest pain or ECG changes 3.  Urosepsis/hydronephrosis/ureteral stone/status post ureteral stent 4.  Atrial fibrillation/atrial flutter, on Eliquis for stroke prevention  Recommendations  1.  Agree with current therapy 2.  Defer full dose anticoagulation 3.  Continue Eliquis for stroke prevention 4.  Continue Plavix to prevent stent thrombosis 5.  DC aspirin to minimize bleeding 6.  No further cardiac diagnostics at this time  Signed: Isaias Cowman MD,PhD,  [facility] 08/18/2017, 8:51 AM

## 2017-08-19 LAB — BASIC METABOLIC PANEL
Anion gap: 7 (ref 5–15)
BUN: 23 mg/dL — AB (ref 6–20)
CALCIUM: 8.4 mg/dL — AB (ref 8.9–10.3)
CHLORIDE: 104 mmol/L (ref 101–111)
CO2: 26 mmol/L (ref 22–32)
CREATININE: 1.01 mg/dL — AB (ref 0.44–1.00)
GFR calc Af Amer: 58 mL/min — ABNORMAL LOW (ref >60)
GFR calc non Af Amer: 50 mL/min — ABNORMAL LOW (ref >60)
GLUCOSE: 233 mg/dL — AB (ref 65–99)
Potassium: 4.1 mmol/L (ref 3.5–5.1)
Sodium: 137 mmol/L (ref 135–145)

## 2017-08-19 LAB — CBC
HEMATOCRIT: 33 % — AB (ref 35.0–47.0)
HEMOGLOBIN: 10.5 g/dL — AB (ref 12.0–16.0)
MCH: 25.3 pg — AB (ref 26.0–34.0)
MCHC: 31.8 g/dL — AB (ref 32.0–36.0)
MCV: 79.7 fL — AB (ref 80.0–100.0)
Platelets: 222 10*3/uL (ref 150–440)
RBC: 4.14 MIL/uL (ref 3.80–5.20)
RDW: 16.5 % — AB (ref 11.5–14.5)
WBC: 13.3 10*3/uL — ABNORMAL HIGH (ref 3.6–11.0)

## 2017-08-19 LAB — GLUCOSE, CAPILLARY
GLUCOSE-CAPILLARY: 180 mg/dL — AB (ref 65–99)
GLUCOSE-CAPILLARY: 113 mg/dL — AB (ref 65–99)
GLUCOSE-CAPILLARY: 201 mg/dL — AB (ref 65–99)
Glucose-Capillary: 235 mg/dL — ABNORMAL HIGH (ref 65–99)
Glucose-Capillary: 206 mg/dL — ABNORMAL HIGH (ref 65–99)

## 2017-08-19 LAB — PROCALCITONIN: Procalcitonin: 3.94 ng/mL

## 2017-08-19 LAB — LACTIC ACID, PLASMA: Lactic Acid, Venous: 1.5 mmol/L (ref 0.5–1.9)

## 2017-08-19 MED ORDER — INSULIN ASPART 100 UNIT/ML ~~LOC~~ SOLN: 0.0000 [IU] | Freq: Three times a day (TID) | SUBCUTANEOUS | Status: DC

## 2017-08-19 MED ORDER — IPRATROPIUM-ALBUTEROL 0.5-2.5 (3) MG/3ML IN SOLN: 3.0000 mL | Freq: Four times a day (QID) | RESPIRATORY_TRACT | Status: DC | PRN

## 2017-08-19 MED ORDER — INSULIN ASPART 100 UNIT/ML ~~LOC~~ SOLN
0.0000 [IU] | Freq: Every day | SUBCUTANEOUS | Status: DC
  Administered 2017-08-19: 2 [IU] via SUBCUTANEOUS
  Filled 2017-08-19: qty 1

## 2017-08-19 MED ORDER — FUROSEMIDE 10 MG/ML IJ SOLN
40.0000 mg | Freq: Once | INTRAMUSCULAR | Status: DC
  Filled 2017-08-19: qty 4

## 2017-08-19 MED ORDER — INSULIN ASPART 100 UNIT/ML ~~LOC~~ SOLN
7.0000 [IU] | Freq: Three times a day (TID) | SUBCUTANEOUS | Status: DC
  Administered 2017-08-19 – 2017-08-20 (×2): 7 [IU] via SUBCUTANEOUS
  Filled 2017-08-19 (×2): qty 1

## 2017-08-19 NOTE — Progress Notes (Signed)
Inpatient Diabetes Program Recommendations  AACE/ADA: New Consensus Statement on Inpatient Glycemic Control (2015)  Target Ranges:  Prepandial:   less than 140 mg/dL      Peak postprandial:   less than 180 mg/dL (1-2 hours)      Critically ill patients:  140 - 180 mg/dL   Lab Results  Component Value Date   GLUCAP 180 (H) 08/19/2017   HGBA1C 7.4 (H) 06/07/2016    Review of Glycemic Control  Results for Cassandra [name], Cassandra [name] (MRN [id number]) as of 08/19/2017 12:08  Ref. Range 08/18/2017 18:07 08/18/2017 22:12 08/19/2017 07:44 08/19/2017 11:41  Glucose-Capillary Latest Ref Range: 65 - 99 mg/dL 247 (H) 295 (H) 235 (H) 180 (H)    Diabetes history: Type 2 Outpatient Diabetes medications: Lantus 42 units qhs, Humalog 12 units bid Current orders for Inpatient glycemic control: Lantus 50 units qhs, Novolog 5 units tid with meals, Novolog correction 0-9 units tid  Inpatient Diabetes Program Recommendations: Consider adding Novolog 0-5 units qhs, consider increasing Novolog mealtime insulin to 7 units tid, continue Novolog 0-9 units tid as ordered.   Gentry Fitz, RN, BA, MHA, CDE Diabetes Coordinator Inpatient Diabetes Program  [phone] (Team Pager) [phone] ([place]) 08/19/2017 12:11 PM

## 2017-08-19 NOTE — Progress Notes (Signed)
Friona for electrolyte and glucose management    Pharmacy consulted for electrolyte and glucose management for 83 yo female admitted with hydronephrosis. Patient is orderd furosemide 40mg  daily. Patient received decadron 5mg  on 12/16. Patient currently ordered SSI and Lantus 50 Units QHs.   Plan:  Will recheck electrolytes with am labs.  Will continue with current insulin regimen. Will follow blood glucose and adjust as clinically indicated.   12/18 0810 K 4.1, Ca 8.4 (adusted Ca 9.2). On Lasix. Will recheck electrolytes tomorrow with Am labs. Glucose 223 and consistently greater than 200 mg/dL. PTA patient reports glargine 50 units HS, metformin 1000 mg TID, Humalog 12 unit BID AC sliding scale. Patient receuved 20 units sliding scale insulin yesterday. Suggest changing SSI to moderate.  Lizzeth Meder A. [place], [place].D., BCPS Clinical Pharmacist   No Known Allergies  Patient Measurements: Height: 5\' 2"  (157.5 cm) Weight: 185 lb 9.6 oz (84.2 kg) IBW/kg (Calculated) : 50.1  Vital Signs: Temp: 98.5 F (36.9 C) (12/18 0927) Temp Source: Oral (12/18 0927) BP: 122/40 (12/18 0927) Pulse Rate: 63 (12/18 0927) Intake/Output from previous day: 12/17 0701 - 12/18 0700 In: 2210 [P.O.:360; I.V.:300; IV Piggyback:1000] Out: 3325 [Urine:3325] Intake/Output from this shift: No intake/output data recorded.  Labs: Recent Labs    08/17/17 1605 08/17/17 2015 08/18/17 0754 08/19/17 0810  WBC 19.6* 17.2* 17.1* 13.3*  HGB 13.2 11.0* 10.9* 10.5*  HCT 42.7 34.5* 34.2* 33.0*  PLT 354 254 230 222  APTT  --  33  --   --   CREATININE 1.14* 1.03* 1.08* 1.01*  MG  --  1.5* 2.1  --   PHOS  --  4.0  --   --   ALBUMIN 4.1 3.1* 3.0*  --   PROT 8.3* 6.3* 6.7  --   AST 28 25 22   --   ALT 12* 11* 12*  --   ALKPHOS 80 57 57  --   BILITOT 0.6 0.9 0.8  --    Estimated Creatinine Clearance: 43.2 mL/min (A) (by C-G formula based on SCr of 1.01 mg/dL  (H)).  Pharmacy will continue to monitor and adjust per consult.   Bhavya Eschete A Andera Cranmer 08/19/2017,9:55 AM

## 2017-08-19 NOTE — Progress Notes (Addendum)
[facility] [place] NAME: Cassandra [name]    MR#:  [id number]  DATE OF BIRTH:  05/04/1933  SUBJECTIVE:  CHIEF COMPLAINT:   Chief Complaint  Patient presents with  . Flank Pain  . Abdominal Pain  . Shortness of Breath  . Leg Swelling   The patient has urine frequency. REVIEW OF SYSTEMS:  Review of Systems  Constitutional: Negative for chills, fever and malaise/fatigue.  HENT: Negative for sore throat.   Eyes: Negative for blurred vision and double vision.  Respiratory: Negative for cough, hemoptysis, shortness of breath, wheezing and stridor.   Cardiovascular: Negative for chest pain, palpitations, orthopnea and leg swelling.  Gastrointestinal: Negative for abdominal pain, blood in stool, diarrhea, melena, nausea and vomiting.  Genitourinary: Positive for frequency. Negative for dysuria, flank pain and hematuria.  Musculoskeletal: Negative for back pain and joint pain.  Neurological: Negative for dizziness, sensory change, focal weakness, seizures, loss of consciousness, weakness and headaches.  Endo/Heme/Allergies: Negative for polydipsia.  Psychiatric/Behavioral: Negative for depression. The patient is not nervous/anxious.     DRUG ALLERGIES:  No Known Allergies VITALS:  Blood pressure (!) 123/55, pulse 66, temperature 98.2 F (36.8 C), temperature source Oral, resp. rate 19, height 5\' 2"  (1.575 m), weight 185 lb 9.6 oz (84.2 kg), SpO2 93 %. PHYSICAL EXAMINATION:  Physical Exam  Constitutional: She is oriented to person, place, and time and well-developed, well-nourished, and in no distress.  HENT:  Head: Normocephalic.  Mouth/Throat: Oropharynx is clear and moist.  Eyes: Conjunctivae and EOM are normal. Pupils are equal, round, and reactive to light. No scleral icterus.  Neck: Normal range of motion. Neck supple. No JVD present. No tracheal deviation present.  Cardiovascular: Normal rate, regular rhythm and normal heart  sounds. Exam reveals no gallop.  No murmur heard. Pulmonary/Chest: Effort normal and breath sounds normal. No respiratory distress. She has no wheezes. She has no rales.  Abdominal: Soft. Bowel sounds are normal. She exhibits no distension. There is no tenderness. There is no rebound.  Musculoskeletal: Normal range of motion. She exhibits no edema or tenderness.  Neurological: She is alert and oriented to person, place, and time. No cranial nerve deficit.  Skin: No rash noted. No erythema.  Psychiatric: Affect normal.   LABORATORY PANEL:  Female CBC Recent Labs  Lab 08/19/17 0810  WBC 13.3*  HGB 10.5*  HCT 33.0*  PLT 222   ------------------------------------------------------------------------------------------------------------------ Chemistries  Recent Labs  Lab 08/18/17 0754 08/19/17 0810  NA 136 137  K 4.4 4.1  CL 106 104  CO2 24 26  GLUCOSE 307* 233*  BUN 20 23*  CREATININE 1.08* 1.01*  CALCIUM 8.6* 8.4*  MG 2.1  --   AST 22  --   ALT 12*  --   ALKPHOS 57  --   BILITOT 0.8  --    RADIOLOGY:  No results found. ASSESSMENT AND PLAN:   Sepsis due to UTI (urinary tract infection) Improving leukocytosis. Continue IV Rocephin and follow-up CBC, urine culture (KLEBSIELLA PNEUMONIAE )  and blood culture (negative so far).  Hydronephrosis due to Left ureteral stone S/p Cystoscopy with left retrograde pyelogram, left ureteral stent placement  Severe lactic acidosis secondary to sepsis.  Improved. Acute respiratory failure with hypoxia due to above..  Off BiPAP and off oxygen.  Borderline elevated troponin, due to above. CAD. Continue Plavix to prevent stent thrombosis and discontinued aspirin to minimize bleeding per Dr. Saralyn Pilar.  Continue Crestor.  Chronic A. fib.  Continue Eliquis.  Diabetes.  Continue Lantus 50 units at bedtime with sliding scale.  Increased NovoLog 7 units before meals.  OSA.  CPAP at night.  All the records are reviewed and case  discussed with Care Management/Social Worker. Management plans discussed with the patient, her husband and they are in agreement.  CODE STATUS: Full Code  TOTAL TIME TAKING CARE OF THIS PATIENT: 33 minutes.   More than 50% of the time was spent in counseling/coordination of care: YES  POSSIBLE D/C IN 1-2 DAYS, DEPENDING ON CLINICAL CONDITION.   Demetrios Loll M.D on 08/19/2017 at 4:03 PM  Between 7am to 6pm - Pager - [phone]  After 6pm go to [email or web address] - [facility]

## 2017-08-19 NOTE — Progress Notes (Signed)
Pt bp low. Dr Jannifer Franklin notified. Orders for repeat lactic acid. Lactic acid - 2.2. MD then ordered 1L NS bolus and given. BP improved but pt slightly SOB with some expiratory wheezes. Respiratory notified. Nightly CPAP placed. Pt slept throughout the night with no s/s of distress. This AM pt wanted to get OOB. I checked her VS (prior to ambulation), BP elevated O2 sats - low 90s. Pt noted to have some wheezing before and after ambulation. Pt was only able to ambulate a few feet. (c/o weakness and soreness). Dr Marcille Blanco notified of clinical s/s. Orders for IV lasix 40mg  x 1. Went back 1hour later to recheck BP and clinical s/s. No wheezing observed. Urine output is very adequate and Manual BP check was WDL. MD notified lasix held. Will continue to monitor.

## 2017-08-20 LAB — PROCALCITONIN: Procalcitonin: 1.95 ng/mL

## 2017-08-20 LAB — URINE CULTURE: Culture: >=100000 — AB

## 2017-08-20 LAB — CBC
HEMATOCRIT: 33.7 % — AB (ref 35.0–47.0)
HEMOGLOBIN: 10.8 g/dL — AB (ref 12.0–16.0)
MCH: 25.5 pg — AB (ref 26.0–34.0)
MCHC: 32.1 g/dL (ref 32.0–36.0)
MCV: 79.5 fL — AB (ref 80.0–100.0)
Platelets: 251 10*3/uL (ref 150–440)
RBC: 4.24 MIL/uL (ref 3.80–5.20)
RDW: 16.7 % — ABNORMAL HIGH (ref 11.5–14.5)
WBC: 8.8 10*3/uL (ref 3.6–11.0)

## 2017-08-20 LAB — BASIC METABOLIC PANEL
Anion gap: 8 (ref 5–15)
BUN: 20 mg/dL (ref 6–20)
CHLORIDE: 103 mmol/L (ref 101–111)
CO2: 28 mmol/L (ref 22–32)
Calcium: 8.6 mg/dL — ABNORMAL LOW (ref 8.9–10.3)
Creatinine, Ser: 0.94 mg/dL (ref 0.44–1.00)
GFR calc Af Amer: >60 mL/min (ref >60)
GFR calc non Af Amer: 55 mL/min — ABNORMAL LOW (ref >60)
GLUCOSE: 122 mg/dL — AB (ref 65–99)
POTASSIUM: 3.8 mmol/L (ref 3.5–5.1)
Sodium: 139 mmol/L (ref 135–145)

## 2017-08-20 LAB — PHOSPHORUS: Phosphorus: 3.2 mg/dL (ref 2.5–4.6)

## 2017-08-20 LAB — GLUCOSE, CAPILLARY: Glucose-Capillary: 93 mg/dL (ref 65–99)

## 2017-08-20 LAB — MAGNESIUM: Magnesium: 1.7 mg/dL (ref 1.7–2.4)

## 2017-08-20 MED ORDER — CEPHALEXIN 500 MG PO CAPS: 500.0000 mg | ORAL_CAPSULE | Freq: Two times a day (BID) | ORAL | 0 refills | Status: DC

## 2017-08-20 MED ORDER — CEPHALEXIN 500 MG PO CAPS: 500.0000 mg | ORAL_CAPSULE | Freq: Two times a day (BID) | ORAL | Status: DC

## 2017-08-20 NOTE — Progress Notes (Signed)
Discharge instructions, prescription, and follow up appointment information provided. Patient verbalized understanding. Discharged home. Escorted out via wheelchair. ELQ

## 2017-08-20 NOTE — Discharge Instructions (Signed)
Ureteral Stent Implantation, Care After Refer to this sheet in the next few weeks. These instructions provide you with information about caring for yourself after your procedure. Your health care provider may also give you more specific instructions. Your treatment has been planned according to current medical practices, but problems sometimes occur. Call your health care provider if you have any problems or questions after your procedure.  Removal of the stent: Be sure to follow-up with [facility]/Dr. Erlene Quan to plan stent/stone removal.  What can I expect after the procedure? After the procedure, it is common to have:  Nausea.  Mild pain when you urinate. You may feel this pain in your lower back or lower abdomen. Pain should stop within a few minutes after you urinate. This may last for up to 1 week.  A small amount of blood in your urine for several days.  Follow these instructions at home:  Medicines  Take over-the-counter and prescription medicines only as told by your health care provider.  If you were prescribed an antibiotic medicine, take it as told by your health care provider. Do not stop taking the antibiotic even if you start to feel better.  Do not drive for 24 hours if you received a sedative.  Do not drive or operate heavy machinery while taking prescription pain medicines. Activity  Return to your normal activities as told by your health care provider. Ask your health care provider what activities are safe for you.  Do not lift anything that is heavier than 10 lb (4.5 kg). Follow this limit for 1 week after your procedure, or for as long as told by your health care provider. General instructions  Watch for any blood in your urine. Call your health care provider if the amount of blood in your urine increases.  If you have a catheter: ? Follow instructions from your health care provider about taking care of your catheter and collection  bag. ? Do not take baths, swim, or use a hot tub until your health care provider approves.  Drink enough fluid to keep your urine clear or pale yellow.  Keep all follow-up visits as told by your health care provider. This is important. Contact a health care provider if:  You have pain that gets worse or does not get better with medicine, especially pain when you urinate.  You have difficulty urinating.  You feel nauseous or you vomit repeatedly during a period of more than 2 days after the procedure. Get help right away if:  Your urine is dark red or has blood clots in it.  You are leaking urine (have incontinence).  The end of the stent comes out of your urethra.  You cannot urinate.  You have sudden, sharp, or severe pain in your abdomen or lower back.  You have a fever. This information is not intended to replace advice given to you by your health care provider. Make sure you discuss any questions you have with your health care provider. Document Released: 04/21/2013 Document Revised: 01/25/2016 Document Reviewed: 03/03/2015 Elsevier Interactive Patient Education  2018 [employer].  heart healthy and ADA diet.

## 2017-08-20 NOTE — Care Management Important Message (Signed)
Important Message  Patient Details  Name: Cassandra [name] MRN: [id number] Date of Birth: September 15, 1932   Medicare Important Message Given:  Yes    Beverly Sessions, RN 08/20/2017, 11:41 AM

## 2017-08-20 NOTE — Progress Notes (Signed)
[place] for electrolyte and glucose management    Pharmacy consulted for electrolyte and glucose management for 83 yo female admitted with hydronephrosis. Patient is orderd furosemide 40mg  daily. Patient received decadron 5mg  on 12/16. Patient currently ordered SSI and Lantus 50 Units QHs.   Plan:  Will recheck electrolytes with am labs.  Will continue with current insulin regimen. Will follow blood glucose and adjust as clinically indicated.   12/18 0810 K 4.1, Ca 8.4 (adusted Ca 9.2). On Lasix. Will recheck electrolytes tomorrow with Am labs. Glucose 223 and consistently greater than 200 mg/dL. PTA patient reports glargine 50 units HS, metformin 1000 mg TID, Humalog 12 unit BID AC sliding scale. Patient receuved 20 units sliding scale insulin yesterday. Suggest changing SSI to moderate.  Stephanny Tsutsui A. [place], [place].D., BCPS Clinical Pharmacist   12/19 0436 K 3.8, Ca 8.6, phos 3.2, Mg 1.7. No further supplement required at this point. Will recheck electrolytes in 2 days. Glucose looks better controlled - MD added meal time insulin and kept on sensitive sliding scale. Will continue to follow. Whitaker Holderman A. [place], [place].D., BCPS Clinical Pharmacist  No Known Allergies  Patient Measurements: Height: 5\' 2"  (157.5 cm) Weight: 180 lb 6.4 oz (81.8 kg) IBW/kg (Calculated) : 50.1  Vital Signs: Temp: 98.9 F (37.2 C) (12/19 0535) Temp Source: Oral (12/19 0535) BP: 129/68 (12/19 0535) Pulse Rate: 60 (12/19 0535) Intake/Output from previous day: 12/18 0701 - 12/19 0700 In: 290 [P.O.:240; IV Piggyback:50] Out: 400 [Urine:400] Intake/Output from this shift: No intake/output data recorded.  Labs: Recent Labs    08/17/17 1605 08/17/17 2015 08/18/17 0754 08/19/17 0810 08/20/17 0436  WBC 19.6* 17.2* 17.1* 13.3* 8.8  HGB 13.2 11.0* 10.9* 10.5* 10.8*  HCT 42.7 34.5* 34.2* 33.0* 33.7*  PLT 354 254 230 222 251  APTT  --  33  --   --   --    CREATININE 1.14* 1.03* 1.08* 1.01* 0.94  MG  --  1.5* 2.1  --  1.7  PHOS  --  4.0  --   --  3.2  ALBUMIN 4.1 3.1* 3.0*  --   --   PROT 8.3* 6.3* 6.7  --   --   AST 28 25 22   --   --   ALT 12* 11* 12*  --   --   ALKPHOS 80 57 57  --   --   BILITOT 0.6 0.9 0.8  --   --    Estimated Creatinine Clearance: 45.7 mL/min (by C-G formula based on SCr of 0.94 mg/dL).  Pharmacy will continue to monitor and adjust per consult.   [place] 08/20/2017,7:06 AM

## 2017-08-20 NOTE — Discharge Summary (Signed)
[facility] [place] NAME: Cassandra [name]    MR#:  [id number]  DATE OF BIRTH:  1933/01/17  DATE OF ADMISSION:  08/17/2017   ADMITTING PHYSICIAN: Idelle Crouch, MD  DATE OF DISCHARGE: 08/20/2017 11:47 AM  PRIMARY CARE PHYSICIAN: Idelle Crouch, MD   ADMISSION DIAGNOSIS:  Ureterolithiasis [N20.1] Respiratory distress [R06.03] Acute pyelonephritis [N10] Sepsis, due to unspecified organism ([place]) [A41.9] Sepsis ([place]) [A41.9] DISCHARGE DIAGNOSIS:  Principal Problem:   Sepsis ([place]) Active Problems:   UTI (urinary tract infection)   Acute respiratory distress   Hydronephrosis due to obstruction of ureter  SECONDARY DIAGNOSIS:   Past Medical History:  Diagnosis Date  . Acquired hypothyroidism 01/06/2014  . Arthritis   . B12 deficiency 02/15/2014  . Benign essential hypertension 01/06/2014  . Breast cancer [facility]) 1982   Right breast cancer - chemotherapy  . CAD (coronary artery disease), native coronary artery 01/06/2014  . Carotid artery calcification   . Chronic airway obstruction, not elsewhere classified 01/06/2014  . Diabetes mellitus without complication ([place])   . Pure hypercholesterolemia 01/06/2014  . Skin cancer   . Sleep apnea    HOSPITAL COURSE:  Sepsis due to UTI (urinary tract infection) Improving leukocytosis. She was treated with IV Rocephin, leukocytosis improved, urine culture (KLEBSIELLA PNEUMONIAE, sensitive to Rocephin)  and blood culture (negative so far).  Changed to Keflex for 7 more days.  Hydronephrosis due to Left ureteral stone S/p Cystoscopy with left retrograde pyelogram, left ureteral stent placement.  Follow-up with urologist as outpatient.  Severe lactic acidosis secondary to sepsis.  Improved. Acute respiratory failure with hypoxia due to above..  Off BiPAP and off oxygen.  Borderline elevated troponin, due to above. CAD. Continue Plavix to prevent stent thrombosis and discontinued aspirin to  minimize bleeding per Dr. Saralyn Pilar.  Continue Crestor.  Chronic A. fib.  Continue Eliquis.  Diabetes.  Continue Lantus 50 units at bedtime with sliding scale.  Increased NovoLog 7 units before meals.  OSA.  CPAP at night.  DISCHARGE CONDITIONS:  Stable, discharged to home today. CONSULTS OBTAINED:  Treatment Team:  Festus Aloe, MD Isaias Cowman, MD DRUG ALLERGIES:  No Known Allergies DISCHARGE MEDICATIONS:   Allergies as of 08/20/2017   No Known Allergies     Medication List    STOP taking these medications   aspirin EC 81 MG tablet   lisinopril 2.5 MG tablet Commonly known as:  PRINIVIL,ZESTRIL     TAKE these medications   apixaban 5 MG Tabs tablet Commonly known as:  ELIQUIS Take 1 tablet (5 mg total) by mouth 2 (two) times daily.   cephALEXin 500 MG capsule Commonly known as:  KEFLEX Take 1 capsule (500 mg total) by mouth every 12 (twelve) hours.   citalopram 40 MG tablet Commonly known as:  CELEXA TAKE ONE TABLET BY MOUTH EVERY NIGHT AT BEDTIME.   clopidogrel 75 MG tablet Commonly known as:  PLAVIX Take 1 tablet (75 mg total) by mouth daily with breakfast.   colchicine 0.6 MG tablet Take 1 tablet (0.6 mg total) by mouth daily.   diltiazem 180 MG 24 hr capsule Commonly known as:  CARDIZEM CD Take 1 capsule (180 mg total) by mouth daily.   furosemide 40 MG tablet Commonly known as:  LASIX Take 40 mg by mouth daily.   HUMALOG KWIKPEN 100 UNIT/ML KiwkPen Generic drug:  insulin lispro Inject 12 Units into the skin 2 (two) times daily before a meal. *Use as directed  per sliding scale*   insulin glargine 100 UNIT/ML injection Commonly known as:  LANTUS Inject 0.42 mLs (42 Units total) into the skin at bedtime. What changed:  how much to take   levothyroxine 112 MCG tablet Commonly known as:  SYNTHROID, LEVOTHROID Take 1 tablet (112 mcg total) by mouth daily before breakfast.   metFORMIN 500 MG 24 hr tablet Commonly known as:   GLUCOPHAGE-XR Take 2 tablets (1,000 mg total) by mouth 2 (two) times daily. Please start this on 8/24 What changed:    when to take this  additional instructions   nitroGLYCERIN 0.4 MG SL tablet Commonly known as:  NITROSTAT Place 1 tablet (0.4 mg total) under the tongue every 5 (five) minutes as needed for chest pain.   ropinirole 5 MG tablet Commonly known as:  REQUIP Take 5 mg by mouth at bedtime.   rosuvastatin 10 MG tablet Commonly known as:  CRESTOR Take 10 mg by mouth at bedtime.   Vitamin D3 2000 units Tabs Take 2,000 Units by mouth daily.        DISCHARGE INSTRUCTIONS:  See AVS.  If you experience worsening of your admission symptoms, develop shortness of breath, life threatening emergency, suicidal or homicidal thoughts you must seek medical attention immediately by calling 911 or calling your MD immediately  if symptoms less severe.  You Must read complete instructions/literature along with all the possible adverse reactions/side effects for all the Medicines you take and that have been prescribed to you. Take any new Medicines after you have completely understood and accpet all the possible adverse reactions/side effects.   Please note  You were cared for by a hospitalist during your hospital stay. If you have any questions about your discharge medications or the care you received while you were in the hospital after you are discharged, you can call the unit and asked to speak with the hospitalist on call if the hospitalist that took care of you is not available. Once you are discharged, your primary care physician will handle any further medical issues. Please note that NO REFILLS for any discharge medications will be authorized once you are discharged, as it is imperative that you return to your primary care physician (or establish a relationship with a primary care physician if you do not have one) for your aftercare needs so that they can reassess your need for  medications and monitor your lab values.    On the day of Discharge:  VITAL SIGNS:  Blood pressure 129/68, pulse 60, temperature 98.9 F (37.2 C), temperature source Oral, resp. rate 18, height 5\' 2"  (1.575 m), weight 180 lb 6.4 oz (81.8 kg), SpO2 90 %. PHYSICAL EXAMINATION:  GENERAL:  83 y.o.-year-old patient lying in the bed with no acute distress.  EYES: Pupils equal, round, reactive to light and accommodation. No scleral icterus. Extraocular muscles intact.  HEENT: Head atraumatic, normocephalic. Oropharynx and nasopharynx clear.  NECK:  Supple, no jugular venous distention. No thyroid enlargement, no tenderness.  LUNGS: Normal breath sounds bilaterally, no wheezing, rales,rhonchi or crepitation. No use of accessory muscles of respiration.  CARDIOVASCULAR: S1, S2 normal. No murmurs, rubs, or gallops.  ABDOMEN: Soft, non-tender, non-distended. Bowel sounds present. No organomegaly or mass.  EXTREMITIES: No pedal edema, cyanosis, or clubbing.  NEUROLOGIC: Cranial nerves II through XII are intact. Muscle strength 5/5 in all extremities. Sensation intact. Gait not checked.  PSYCHIATRIC: The patient is alert and oriented x 3.  SKIN: No obvious rash, lesion, or ulcer.  DATA REVIEW:  CBC Recent Labs  Lab 08/20/17 0436  WBC 8.8  HGB 10.8*  HCT 33.7*  PLT 251    Chemistries  Recent Labs  Lab 08/18/17 0754  08/20/17 0436  NA 136   < > 139  K 4.4   < > 3.8  CL 106   < > 103  CO2 24   < > 28  GLUCOSE 307*   < > 122*  BUN 20   < > 20  CREATININE 1.08*   < > 0.94  CALCIUM 8.6*   < > 8.6*  MG 2.1  --  1.7  AST 22  --   --   ALT 12*  --   --   ALKPHOS 57  --   --   BILITOT 0.8  --   --    < > = values in this interval not displayed.     Microbiology Results  Results for orders placed or performed during the hospital encounter of 08/17/17  Blood culture (single)     Status: None (Preliminary result)   Collection Time: 08/17/17  4:05 PM  Result Value Ref Range Status    Specimen Description BLOOD LT ARM  Final   Special Requests   Final    BOTTLES DRAWN AEROBIC AND ANAEROBIC Blood Culture results may not be optimal due to an inadequate volume of blood received in culture bottles   Culture NO GROWTH 3 DAYS  Final   Report Status PENDING  Incomplete  Blood Culture (routine x 2)     Status: None (Preliminary result)   Collection Time: 08/17/17  4:39 PM  Result Value Ref Range Status   Specimen Description BLOOD RT [facility]  Final   Special Requests   Final    BOTTLES DRAWN AEROBIC AND ANAEROBIC Blood Culture results may not be optimal due to an inadequate volume of blood received in culture bottles   Culture NO GROWTH 3 DAYS  Final   Report Status PENDING  Incomplete  Blood Culture (routine x 2)     Status: None (Preliminary result)   Collection Time: 08/17/17  4:52 PM  Result Value Ref Range Status   Specimen Description BLOOD RT [facility]  Final   Special Requests   Final    BOTTLES DRAWN AEROBIC AND ANAEROBIC Blood Culture adequate volume   Culture NO GROWTH 3 DAYS  Final   Report Status PENDING  Incomplete  Urine culture     Status: Abnormal   Collection Time: 08/17/17  5:07 PM  Result Value Ref Range Status   Specimen Description URINE, RANDOM  Final   Special Requests NONE  Final   Culture >=100,000 COLONIES/mL KLEBSIELLA PNEUMONIAE (A)  Final   Report Status 08/20/2017 FINAL  Final   Organism ID, Bacteria KLEBSIELLA PNEUMONIAE (A)  Final      Susceptibility   Klebsiella pneumoniae - MIC*    AMPICILLIN >=32 RESISTANT Resistant     CEFAZOLIN <=4 SENSITIVE Sensitive     CEFTRIAXONE <=1 SENSITIVE Sensitive     CIPROFLOXACIN <=0.25 SENSITIVE Sensitive     GENTAMICIN <=1 SENSITIVE Sensitive     IMIPENEM <=0.25 SENSITIVE Sensitive     NITROFURANTOIN 128 RESISTANT Resistant     TRIMETH/SULFA <=20 SENSITIVE Sensitive     AMPICILLIN/SULBACTAM 4 SENSITIVE Sensitive     PIP/TAZO <=4 SENSITIVE Sensitive     Extended ESBL NEGATIVE Sensitive     * >=100,000  COLONIES/mL KLEBSIELLA PNEUMONIAE  MRSA PCR Screening     Status: None  Collection Time: 08/17/17  8:33 PM  Result Value Ref Range Status   MRSA by PCR NEGATIVE NEGATIVE Final    Comment:        The GeneXpert MRSA Assay (FDA approved for NASAL specimens only), is one component of a comprehensive MRSA colonization surveillance program. It is not intended to diagnose MRSA infection nor to guide or monitor treatment for MRSA infections.     RADIOLOGY:  No results found.   Management plans discussed with the patient, her husband and they are in agreement.  CODE STATUS: Prior   TOTAL TIME TAKING CARE OF THIS PATIENT: 35 minutes.    Demetrios Loll M.D on 08/20/2017 at 3:39 PM  Between 7am to 6pm - Pager - [phone]  After 6pm go to [email or web address] - Proofreader  [facility]  Office  [phone]  CC: Primary care physician; Idelle Crouch, MD   Note: This dictation was prepared with Dragon dictation along with smaller phrase technology. Any transcriptional errors that result from this process are unintentional.

## 2017-08-22 LAB — CULTURE, BLOOD (SINGLE): Culture: NO GROWTH

## 2017-08-22 LAB — CULTURE, BLOOD (ROUTINE X 2)
CULTURE: NO GROWTH
Culture: NO GROWTH
Special Requests: ADEQUATE

## 2017-09-02 DIAGNOSIS — Z87442 Personal history of urinary calculi: Secondary | ICD-10-CM

## 2017-09-02 HISTORY — DX: Personal history of urinary calculi: Z87.442

## 2017-09-04 ENCOUNTER — Encounter: Payer: Self-pay | Admitting: Urology

## 2017-09-04 ENCOUNTER — Ambulatory Visit (INDEPENDENT_AMBULATORY_CARE_PROVIDER_SITE_OTHER): Payer: Medicare Other | Admitting: Urology

## 2017-09-04 ENCOUNTER — Other Ambulatory Visit: Payer: Self-pay | Admitting: Radiology

## 2017-09-04 VITALS — BP 112/51 | HR 89 | Ht 62.0 in | Wt 176.0 lb

## 2017-09-04 DIAGNOSIS — N200 Calculus of kidney: Secondary | ICD-10-CM | POA: Diagnosis not present

## 2017-09-04 DIAGNOSIS — N201 Calculus of ureter: Secondary | ICD-10-CM

## 2017-09-04 NOTE — Progress Notes (Signed)
09/04/2017 11:25 AM   Cassandra [name] 01/09/1933 [id number]  Referring provider: Idelle Crouch, MD [place] [facility] [place], [place] [address]  Chief Complaint  Patient presents with  . Nephrolithiasis    HPI: The patient is an 84 year old female who presents today for follow-up after undergoing cystoscopy with left ureteral stent placement for 4 mm distal left ureteral calculus in the setting of infection.  She presents today to discuss definitive stone management.  She has completed her antibiotic course.  She is bothered by the stent.  She notes pain with voiding as well as urge and urge incontinence.  Both of these are new since his stent was placed.  She has bothered by this.  Other than this no other complaints since she was last seen.  Urine culture at the time of her stent placement grew Klebsiella resistant to ampicillin and nitrofurantoin but otherwise pansensitive.  CT personally reviewed by me which shows no other stones except for the previously mentioned distal left ureteral calculus.  PMH: Past Medical History:  Diagnosis Date  . Acquired hypothyroidism 01/06/2014  . Arthritis   . B12 deficiency 02/15/2014  . Benign essential hypertension 01/06/2014  . Breast cancer [facility]) 1982   Right breast cancer - chemotherapy  . CAD (coronary artery disease), native coronary artery 01/06/2014  . Carotid artery calcification   . Chronic airway obstruction, not elsewhere classified 01/06/2014  . Diabetes mellitus without complication ([place])   . Pure hypercholesterolemia 01/06/2014  . Skin cancer   . Sleep apnea     Surgical History: Past Surgical History:  Procedure Laterality Date  . APPENDECTOMY    . AUGMENTATION MAMMAPLASTY Bilateral 1982/redo in 2014  . CARDIAC CATHETERIZATION N/A 04/23/2016   Procedure: Left Heart Cath and Coronary Angiography;  Surgeon: Corey Skains, MD;  Location: [place] CV LAB;  Service: Cardiovascular;  Laterality:  N/A;  . CARDIAC CATHETERIZATION Left 04/23/2016   Procedure: Coronary Stent Intervention;  Surgeon: Yolonda Kida, MD;  Location: [place] CV LAB;  Service: Cardiovascular;  Laterality: Left;  . CAROTID ARTERY ANGIOPLASTY    . CHOLECYSTECTOMY    . CORONARY ANGIOPLASTY WITH STENT PLACEMENT  september 9th 2017  . CYSTOSCOPY W/ URETERAL STENT PLACEMENT Left 08/17/2017   Procedure: CYSTOSCOPY WITH RETROGRADE PYELOGRAM/URETERAL STENT PLACEMENT;  Surgeon: Festus Aloe, MD;  Location: [facility] ORS;  Service: Urology;  Laterality: Left;  [name] MASTECTOMY Right 1982  . PACEMAKER INSERTION Left 07/03/2016   Procedure: INSERTION PACEMAKER;  Surgeon: Isaias Cowman, MD;  Location: [facility] ORS;  Service: Cardiovascular;  Laterality: Left;  . SKIN CANCER EXCISION    . TUBAL LIGATION      Home Medications:  Allergies as of 09/04/2017   No Known Allergies     Medication List        Accurate as of 09/04/17 11:25 AM. Always use your most recent med list.          apixaban 5 MG Tabs tablet Commonly known as:  ELIQUIS Take 1 tablet (5 mg total) by mouth 2 (two) times daily.   citalopram 40 MG tablet Commonly known as:  CELEXA TAKE ONE TABLET BY MOUTH EVERY NIGHT AT BEDTIME.   clopidogrel 75 MG tablet Commonly known as:  PLAVIX Take 1 tablet (75 mg total) by mouth daily with breakfast.   colchicine 0.6 MG tablet Take 1 tablet (0.6 mg total) by mouth daily.   diltiazem 180 MG 24 hr capsule Commonly known as:  CARDIZEM CD Take  1 capsule (180 mg total) by mouth daily.   furosemide 40 MG tablet Commonly known as:  LASIX Take 40 mg by mouth daily.   HUMALOG KWIKPEN 100 UNIT/ML KiwkPen Generic drug:  insulin lispro Inject 12 Units into the skin 2 (two) times daily before a meal. *Use as directed per sliding scale*   insulin glargine 100 UNIT/ML injection Commonly known as:  LANTUS Inject 0.42 mLs (42 Units total) into the skin at bedtime.   levothyroxine 112 MCG tablet Commonly  known as:  SYNTHROID, LEVOTHROID Take 1 tablet (112 mcg total) by mouth daily before breakfast.   metFORMIN 500 MG 24 hr tablet Commonly known as:  GLUCOPHAGE-XR Take 2 tablets (1,000 mg total) by mouth 2 (two) times daily. Please start this on 8/24   nitroGLYCERIN 0.4 MG SL tablet Commonly known as:  NITROSTAT Place 1 tablet (0.4 mg total) under the tongue every 5 (five) minutes as needed for chest pain.   ropinirole 5 MG tablet Commonly known as:  REQUIP Take 5 mg by mouth at bedtime.   rosuvastatin 10 MG tablet Commonly known as:  CRESTOR Take 10 mg by mouth at bedtime.   tamsulosin 0.4 MG Caps capsule Commonly known as:  FLOMAX Take by mouth.   Vitamin D3 2000 units Tabs Take 2,000 Units by mouth daily.       Allergies: No Known Allergies  Family History: Family History  Problem Relation Age of Onset  . Prostate cancer Neg Hx   . Kidney cancer Neg Hx   . Breast cancer Neg Hx     Social History:  reports that she has quit smoking. she has never used smokeless tobacco. She reports that she drinks alcohol. She reports that she does not use drugs.  ROS: UROLOGY Frequent Urination?: Yes Hard to postpone urination?: Yes Burning/pain with urination?: Yes Get up at night to urinate?: No Leakage of urine?: No Urine stream starts and stops?: No Trouble starting stream?: No Do you have to strain to urinate?: No Blood in urine?: Yes Urinary tract infection?: No Sexually transmitted disease?: No Injury to kidneys or bladder?: No Painful intercourse?: No Weak stream?: No Currently pregnant?: No Vaginal bleeding?: No Last menstrual period?: n  Gastrointestinal Nausea?: No Vomiting?: No Indigestion/heartburn?: No Diarrhea?: No Constipation?: No  Constitutional Fever: No Night sweats?: No Weight loss?: No Fatigue?: Yes  Skin Skin rash/lesions?: No Itching?: No  Eyes Blurred vision?: No Double vision?: No  Ears/Nose/Throat Sore throat?: No Sinus  problems?: No  Hematologic/Lymphatic Swollen glands?: No Easy bruising?: No  Cardiovascular Leg swelling?: Yes Chest pain?: No  Respiratory Cough?: No Shortness of breath?: No  Endocrine Excessive thirst?: Yes  Musculoskeletal Back pain?: No Joint pain?: No  Neurological Headaches?: No Dizziness?: No  Psychologic Depression?: No Anxiety?: No  Physical Exam: BP (!) 112/51   Pulse 89   Ht 5\' 2"  (1.575 m)   Wt 176 lb (79.8 kg)   LMP  (LMP Unknown)   BMI 32.19 kg/m   Constitutional:  Alert and oriented, No acute distress. HEENT: [place] AT, moist mucus membranes.  Trachea midline, no masses. Cardiovascular: No clubbing, cyanosis, or edema. Respiratory: Normal respiratory effort, no increased work of breathing. GI: Abdomen is soft, nontender, nondistended, no abdominal masses GU: No CVA tenderness.  Skin: No rashes, bruises or suspicious lesions. Lymph: No cervical or inguinal adenopathy. Neurologic: Grossly intact, no focal deficits, moving all 4 extremities. Psychiatric: Normal mood and affect.  Laboratory Data: Lab Results  Component Value Date  WBC 8.8 08/20/2017   HGB 10.8 (L) 08/20/2017   HCT 33.7 (L) 08/20/2017   MCV 79.5 (L) 08/20/2017   PLT 251 08/20/2017    Lab Results  Component Value Date   CREATININE 0.94 08/20/2017    No results found for: PSA  No results found for: TESTOSTERONE  Lab Results  Component Value Date   HGBA1C 7.4 (H) 06/07/2016    Urinalysis    Component Value Date/Time   COLORURINE YELLOW (A) 08/17/2017 1707   APPEARANCEUR HAZY (A) 08/17/2017 1707   APPEARANCEUR Clear 05/11/2012 1400   LABSPEC 1.014 08/17/2017 1707   LABSPEC 1.008 05/11/2012 1400   PHURINE 5.0 08/17/2017 1707   GLUCOSEU NEGATIVE 08/17/2017 1707   GLUCOSEU Negative 05/11/2012 1400   HGBUR LARGE (A) 08/17/2017 1707   BILIRUBINUR NEGATIVE 08/17/2017 1707   BILIRUBINUR Negative 05/11/2012 1400   KETONESUR NEGATIVE 08/17/2017 1707   PROTEINUR 100 (A)  08/17/2017 1707   NITRITE NEGATIVE 08/17/2017 1707   LEUKOCYTESUR MODERATE (A) 08/17/2017 1707   LEUKOCYTESUR Trace 05/11/2012 1400    Pertinent Imaging: Previous CT from hospital reviewed as above  Assessment & Plan:    1.  4 mm distal left ureteral calculus status post stent Discussed definitive stone management the patient know that her infection is cleared.  We will send her urine for culture to be sure.  We discussed the risk, benefits, indications of cystoscopy with left ureteroscopy, laser lithotripsy, left ureteral stent exchange.  All questions were answered.  The patient has elected to proceed.  Nickie Retort, MD  [facility] [address], [place], [place] [address] [phone]

## 2017-09-04 NOTE — H&P (View-Only) (Signed)
09/04/2017 11:25 AM   Cassandra [name] Jun 15, 1933 [id number]  Referring provider: Idelle Crouch, MD [place] [facility], [place] [address]  Chief Complaint  Patient presents with  . Nephrolithiasis    HPI: The patient is an 84 year old female who presents today for follow-up after undergoing cystoscopy with left ureteral stent placement for 4 mm distal left ureteral calculus in the setting of infection.  She presents today to discuss definitive stone management.  She has completed her antibiotic course.  She is bothered by the stent.  She notes pain with voiding as well as urge and urge incontinence.  Both of these are new since his stent was placed.  She has bothered by this.  Other than this no other complaints since she was last seen.  Urine culture at the time of her stent placement grew Klebsiella resistant to ampicillin and nitrofurantoin but otherwise pansensitive.  CT personally reviewed by me which shows no other stones except for the previously mentioned distal left ureteral calculus.  PMH: Past Medical History:  Diagnosis Date  . Acquired hypothyroidism 01/06/2014  . Arthritis   . B12 deficiency 02/15/2014  . Benign essential hypertension 01/06/2014  . Breast cancer [facility]) 1982   Right breast cancer - chemotherapy  . CAD (coronary artery disease), native coronary artery 01/06/2014  . Carotid artery calcification   . Chronic airway obstruction, not elsewhere classified 01/06/2014  . Diabetes mellitus without complication ([place])   . Pure hypercholesterolemia 01/06/2014  . Skin cancer   . Sleep apnea     Surgical History: Past Surgical History:  Procedure Laterality Date  . APPENDECTOMY    . AUGMENTATION MAMMAPLASTY Bilateral 1982/redo in 2014  . CARDIAC CATHETERIZATION N/A 04/23/2016   Procedure: Left Heart Cath and Coronary Angiography;  Surgeon: Corey Skains, MD;  Location: [place] CV LAB;  Service: Cardiovascular;  Laterality:  N/A;  . CARDIAC CATHETERIZATION Left 04/23/2016   Procedure: Coronary Stent Intervention;  Surgeon: Yolonda Kida, MD;  Location: [place] CV LAB;  Service: Cardiovascular;  Laterality: Left;  . CAROTID ARTERY ANGIOPLASTY    . CHOLECYSTECTOMY    . CORONARY ANGIOPLASTY WITH STENT PLACEMENT  september 9th 2017  . CYSTOSCOPY W/ URETERAL STENT PLACEMENT Left 08/17/2017   Procedure: CYSTOSCOPY WITH RETROGRADE PYELOGRAM/URETERAL STENT PLACEMENT;  Surgeon: Festus Aloe, MD;  Location: [facility] ORS;  Service: Urology;  Laterality: Left;  [name] MASTECTOMY Right 1982  . PACEMAKER INSERTION Left 07/03/2016   Procedure: INSERTION PACEMAKER;  Surgeon: Isaias [place], MD;  Location: [facility] ORS;  Service: Cardiovascular;  Laterality: Left;  . SKIN CANCER EXCISION    . TUBAL LIGATION      Home Medications:  Allergies as of 09/04/2017   No Known Allergies     Medication List        Accurate as of 09/04/17 11:25 AM. Always use your most recent med list.          apixaban 5 MG Tabs tablet Commonly known as:  ELIQUIS Take 1 tablet (5 mg total) by mouth 2 (two) times daily.   citalopram 40 MG tablet Commonly known as:  CELEXA TAKE ONE TABLET BY MOUTH EVERY NIGHT AT BEDTIME.   clopidogrel 75 MG tablet Commonly known as:  PLAVIX Take 1 tablet (75 mg total) by mouth daily with breakfast.   colchicine 0.6 MG tablet Take 1 tablet (0.6 mg total) by mouth daily.   diltiazem 180 MG 24 hr capsule Commonly known as:  CARDIZEM CD Take  1 capsule (180 mg total) by mouth daily.   furosemide 40 MG tablet Commonly known as:  LASIX Take 40 mg by mouth daily.   HUMALOG KWIKPEN 100 UNIT/ML KiwkPen Generic drug:  insulin lispro Inject 12 Units into the skin 2 (two) times daily before a meal. *Use as directed per sliding scale*   insulin glargine 100 UNIT/ML injection Commonly known as:  LANTUS Inject 0.42 mLs (42 Units total) into the skin at bedtime.   levothyroxine 112 MCG tablet Commonly  known as:  SYNTHROID, LEVOTHROID Take 1 tablet (112 mcg total) by mouth daily before breakfast.   metFORMIN 500 MG 24 hr tablet Commonly known as:  GLUCOPHAGE-XR Take 2 tablets (1,000 mg total) by mouth 2 (two) times daily. Please start this on 8/24   nitroGLYCERIN 0.4 MG SL tablet Commonly known as:  NITROSTAT Place 1 tablet (0.4 mg total) under the tongue every 5 (five) minutes as needed for chest pain.   ropinirole 5 MG tablet Commonly known as:  REQUIP Take 5 mg by mouth at bedtime.   rosuvastatin 10 MG tablet Commonly known as:  CRESTOR Take 10 mg by mouth at bedtime.   tamsulosin 0.4 MG Caps capsule Commonly known as:  FLOMAX Take by mouth.   Vitamin D3 2000 units Tabs Take 2,000 Units by mouth daily.       Allergies: No Known Allergies  Family History: Family History  Problem Relation Age of Onset  . Prostate cancer Neg Hx   . Kidney cancer Neg Hx   . Breast cancer Neg Hx     Social History:  reports that she has quit smoking. she has never used smokeless tobacco. She reports that she drinks alcohol. She reports that she does not use drugs.  ROS: UROLOGY Frequent Urination?: Yes Hard to postpone urination?: Yes Burning/pain with urination?: Yes Get up at night to urinate?: No Leakage of urine?: No Urine stream starts and stops?: No Trouble starting stream?: No Do you have to strain to urinate?: No Blood in urine?: Yes Urinary tract infection?: No Sexually transmitted disease?: No Injury to kidneys or bladder?: No Painful intercourse?: No Weak stream?: No Currently pregnant?: No Vaginal bleeding?: No Last menstrual period?: n  Gastrointestinal Nausea?: No Vomiting?: No Indigestion/heartburn?: No Diarrhea?: No Constipation?: No  Constitutional Fever: No Night sweats?: No Weight loss?: No Fatigue?: Yes  Skin Skin rash/lesions?: No Itching?: No  Eyes Blurred vision?: No Double vision?: No  Ears/Nose/Throat Sore throat?: No Sinus  problems?: No  Hematologic/Lymphatic Swollen glands?: No Easy bruising?: No  Cardiovascular Leg swelling?: Yes Chest pain?: No  Respiratory Cough?: No Shortness of breath?: No  Endocrine Excessive thirst?: Yes  Musculoskeletal Back pain?: No Joint pain?: No  Neurological Headaches?: No Dizziness?: No  Psychologic Depression?: No Anxiety?: No  Physical Exam: BP (!) 112/51   Pulse 89   Ht 5\' 2"  (1.575 m)   Wt 176 lb (79.8 kg)   LMP  (LMP Unknown)   BMI 32.19 kg/m   Constitutional:  Alert and oriented, No acute distress. HEENT: [place] AT, moist mucus membranes.  Trachea midline, no masses. Cardiovascular: No clubbing, cyanosis, or edema. Respiratory: Normal respiratory effort, no increased work of breathing. GI: Abdomen is soft, nontender, nondistended, no abdominal masses GU: No CVA tenderness.  Skin: No rashes, bruises or suspicious lesions. Lymph: No cervical or inguinal adenopathy. Neurologic: Grossly intact, no focal deficits, moving all 4 extremities. Psychiatric: Normal mood and affect.  Laboratory Data: Lab Results  Component Value Date  WBC 8.8 08/20/2017   HGB 10.8 (L) 08/20/2017   HCT 33.7 (L) 08/20/2017   MCV 79.5 (L) 08/20/2017   PLT 251 08/20/2017    Lab Results  Component Value Date   CREATININE 0.94 08/20/2017    No results found for: PSA  No results found for: TESTOSTERONE  Lab Results  Component Value Date   HGBA1C 7.4 (H) 06/07/2016    Urinalysis    Component Value Date/Time   COLORURINE YELLOW (A) 08/17/2017 1707   APPEARANCEUR HAZY (A) 08/17/2017 1707   APPEARANCEUR Clear 05/11/2012 1400   LABSPEC 1.014 08/17/2017 1707   LABSPEC 1.008 05/11/2012 1400   PHURINE 5.0 08/17/2017 1707   GLUCOSEU NEGATIVE 08/17/2017 1707   GLUCOSEU Negative 05/11/2012 1400   HGBUR LARGE (A) 08/17/2017 1707   BILIRUBINUR NEGATIVE 08/17/2017 1707   BILIRUBINUR Negative 05/11/2012 1400   KETONESUR NEGATIVE 08/17/2017 1707   PROTEINUR 100 (A)  08/17/2017 1707   NITRITE NEGATIVE 08/17/2017 1707   LEUKOCYTESUR MODERATE (A) 08/17/2017 1707   LEUKOCYTESUR Trace 05/11/2012 1400    Pertinent Imaging: Previous CT from hospital reviewed as above  Assessment & Plan:    1.  4 mm distal left ureteral calculus status post stent Discussed definitive stone management the patient know that her infection is cleared.  We will send her urine for culture to be sure.  We discussed the risk, benefits, indications of cystoscopy with left ureteroscopy, laser lithotripsy, left ureteral stent exchange.  All questions were answered.  The patient has elected to proceed.  Nickie Retort, MD  [facility] [address], [place], [place] [address] [phone]

## 2017-09-06 LAB — URINE CULTURE: Organism ID, Bacteria: NO GROWTH

## 2017-09-11 ENCOUNTER — Telehealth: Payer: Self-pay | Admitting: Radiology

## 2017-09-11 ENCOUNTER — Inpatient Hospital Stay: Admission: RE | Admit: 2017-09-11 | Payer: Medicare Other | Source: Ambulatory Visit

## 2017-09-11 NOTE — Telephone Encounter (Signed)
Called pt to reschedule pre-admit testing appt. Call transferred to [facility] in [facility] to reschedule.

## 2017-09-12 ENCOUNTER — Encounter
Admission: RE | Admit: 2017-09-12 | Discharge: 2017-09-12 | Disposition: A | Discharge status: Home / Self Care | Payer: Medicare Other | Source: Ambulatory Visit | Attending: Urology | Admitting: Urology

## 2017-09-12 ENCOUNTER — Other Ambulatory Visit: Payer: Self-pay

## 2017-09-12 HISTORY — DX: Major depressive disorder, single episode, unspecified: F32.9

## 2017-09-12 HISTORY — DX: Depression, unspecified: F32.A

## 2017-09-12 HISTORY — DX: Acute myocardial infarction, unspecified: I21.9

## 2017-09-12 HISTORY — DX: Presence of cardiac pacemaker: Z95.0

## 2017-09-12 HISTORY — DX: Personal history of urinary calculi: Z87.442

## 2017-09-12 NOTE — Pre-Procedure Instructions (Signed)
Patient states that all her anticoagulant medications have been stopped by her physicians.

## 2017-09-12 NOTE — Pre-Procedure Instructions (Signed)
ECG 12-lead2/15/2018 [place] [facility] Component Name Value Ref Range  Vent Rate (bpm) 78   PR Interval (msec) 144   QRS Interval (msec) 86   QT Interval (msec) 392   QTc (msec) 446   Result Narrative  Normal sinus rhythm Left axis deviation Abnormal ECG When compared with ECG of 15-Jul-2016 10:31, Sinus rhythm has replaced Electronic atrial pacemaker I reviewed and concur with this report. Electronically signed KC:XWNP MD, KEN ([id number]) on 10/18/2016 10:03:51 AM

## 2017-09-12 NOTE — Patient Instructions (Signed)
Your procedure is scheduled on: Tuesday, January 156,2019  Report to [facility], 2ND FLOOR  To find out your arrival time please call [phone] between 1PM - 3PM on Monday, September 15, 2017  Remember: Instructions that are not followed completely may result in serious medical risk, up to and including death, or upon the discretion of your surgeon and anesthesiologist your surgery may need to be rescheduled.     _X__ 1. Do not eat food after midnight the night before your procedure.                 No gum chewing or hard candies. You may drink clear liquids up to 2 hours                 before you are scheduled to arrive for your surgery- DO not drink clear                 liquids within 2 hours of the start of your surgery.                 Clear Liquids include:  water, apple juice without pulp, clear carbohydrate                 drink such as Clearfast of Gart[place]ade, Black Coffee or Tea (Do not add                 anything to coffee or tea).  BEING DIABETIC IT IS ALWAYS PREFERABLE THAT YOU DRINK ONLY WATER.     _X__ 2.  No Alcohol for 24 hours before or after surgery.   _X__ 3.  Do Not Smoke or use e-cigarettes For 24 Hours Prior to Your Surgery.                 Do not use any chewable tobacco products for at least 6 hours prior to                 surgery.  ____  4.  Bring all medications with you on the day of surgery if instructed.   __X__  5.  Notify your doctor if there is any change in your medical condition      (cold, fever, infections).     Do not wear jewelry, make-up, hairpins, clips or nail polish. Do not wear lotions, powders, or perfumes. You may wear deodorant. Do not shave 48 hours prior to surgery. Men may shave face and neck. Do not bring valuables to the hospital.    [facility] is not responsible for any belongings or valuables.  Contacts, dentures or bridgework may not be worn into surgery. Leave your suitcase in the car. After  surgery it may be brought to your room. For patients admitted to the hospital, discharge time is determined by your treatment team.   Patients discharged the day of surgery will not be allowed to drive home.   Please read over the following fact sheets that you were given:   PREPARING FOR SURGERY    ____ Take these medicines the morning of surgery with A SIP OF WATER:    1. CELEXA  2. CARDIAZEM  3. SYNTHROID  4. FLOMAX   5.  6.  ____ Fleet Enema (as directed)   ____ Use ANTIBACTERIAL SOAP IN A SHOWER THE NIGHT BEFORE SURGERY  ____ Use inhalers on the day of surgery  __X__ Stop metformin 2 days prior to surgery . STOP ON 1/13, 2019   __X__  Take 1/2 of usual insulin dose the night before surgery. No insulin the morning          of surgery. TAKE ONLY 25 UNITS OF LANTUS ON THE NIGHT BEFORE SURGERY.  DO NOT TAKE HUMALOG IN THE MORNING OF SURGERY.  _X___ Stop Coumadin/Plavix/aspirin AS INSTRUCTED BY PHYSICIANS.  _X___ Stop Anti-inflammatories AS OF TODAY   _X___ Stop supplements until after surgery. STOP VITAMIN D3 AS OF TODAY.    __X__ Bring C-Pap to the hospital.   CONTINUE YOUR REGULAR MEDICATION UP UNTIL THE DAY OF SURGERY EXCEPT AS LISTED ABOVE.   DO NOT TAKE LASIX OR HUMALOG OR METFORMIN ON DAY OF SURGERY.

## 2017-09-15 MED ORDER — CEFAZOLIN SODIUM-DEXTROSE 2-4 GM/100ML-% IV SOLN
2.0000 g | INTRAVENOUS | Status: AC
  Administered 2017-09-16: 2 g via INTRAVENOUS

## 2017-09-15 MED ORDER — GENTAMICIN SULFATE 40 MG/ML IJ SOLN
120.0000 mg | INTRAVENOUS | Status: AC
  Administered 2017-09-16: 120 mg via INTRAVENOUS
  Filled 2017-09-15: qty 3

## 2017-09-16 ENCOUNTER — Ambulatory Visit: Payer: Medicare Other

## 2017-09-16 ENCOUNTER — Ambulatory Visit: Payer: Medicare Other | Admitting: Certified Registered Nurse Anesthetist

## 2017-09-16 ENCOUNTER — Encounter: Payer: Self-pay | Admitting: Certified Registered Nurse Anesthetist

## 2017-09-16 ENCOUNTER — Encounter
Admission: AD | Disposition: A | Discharge status: Home / Self Care | Payer: Self-pay | Source: Ambulatory Visit | Attending: Internal Medicine

## 2017-09-16 ENCOUNTER — Inpatient Hospital Stay
Admission: AD | Admit: 2017-09-16 | Discharge: 2017-09-23 | DRG: 853 | Disposition: A | Discharge status: Home / Self Care | Payer: Medicare Other | Source: Ambulatory Visit | Attending: Internal Medicine | Admitting: Internal Medicine

## 2017-09-16 DIAGNOSIS — I5033 Acute on chronic diastolic (congestive) heart failure: Secondary | ICD-10-CM | POA: Diagnosis present

## 2017-09-16 DIAGNOSIS — F329 Major depressive disorder, single episode, unspecified: Secondary | ICD-10-CM | POA: Diagnosis present

## 2017-09-16 DIAGNOSIS — B961 Klebsiella pneumoniae [K. pneumoniae] as the cause of diseases classified elsewhere: Secondary | ICD-10-CM | POA: Diagnosis present

## 2017-09-16 DIAGNOSIS — Z853 Personal history of malignant neoplasm of breast: Secondary | ICD-10-CM | POA: Diagnosis not present

## 2017-09-16 DIAGNOSIS — N390 Urinary tract infection, site not specified: Secondary | ICD-10-CM | POA: Diagnosis present

## 2017-09-16 DIAGNOSIS — I110 Hypertensive heart disease with heart failure: Secondary | ICD-10-CM | POA: Diagnosis present

## 2017-09-16 DIAGNOSIS — Z9221 Personal history of antineoplastic chemotherapy: Secondary | ICD-10-CM

## 2017-09-16 DIAGNOSIS — R7981 Abnormal blood-gas level: Secondary | ICD-10-CM

## 2017-09-16 DIAGNOSIS — Z9981 Dependence on supplemental oxygen: Secondary | ICD-10-CM

## 2017-09-16 DIAGNOSIS — N201 Calculus of ureter: Secondary | ICD-10-CM

## 2017-09-16 DIAGNOSIS — Z7901 Long term (current) use of anticoagulants: Secondary | ICD-10-CM | POA: Diagnosis not present

## 2017-09-16 DIAGNOSIS — Z85828 Personal history of other malignant neoplasm of skin: Secondary | ICD-10-CM

## 2017-09-16 DIAGNOSIS — J8410 Pulmonary fibrosis, unspecified: Secondary | ICD-10-CM | POA: Diagnosis present

## 2017-09-16 DIAGNOSIS — G4700 Insomnia, unspecified: Secondary | ICD-10-CM | POA: Diagnosis not present

## 2017-09-16 DIAGNOSIS — J95821 Acute postprocedural respiratory failure: Secondary | ICD-10-CM | POA: Diagnosis not present

## 2017-09-16 DIAGNOSIS — I2510 Atherosclerotic heart disease of native coronary artery without angina pectoris: Secondary | ICD-10-CM | POA: Diagnosis present

## 2017-09-16 DIAGNOSIS — R6521 Severe sepsis with septic shock: Secondary | ICD-10-CM | POA: Diagnosis present

## 2017-09-16 DIAGNOSIS — Z87891 Personal history of nicotine dependence: Secondary | ICD-10-CM

## 2017-09-16 DIAGNOSIS — Z7982 Long term (current) use of aspirin: Secondary | ICD-10-CM

## 2017-09-16 DIAGNOSIS — Z79899 Other long term (current) drug therapy: Secondary | ICD-10-CM | POA: Diagnosis not present

## 2017-09-16 DIAGNOSIS — E039 Hypothyroidism, unspecified: Secondary | ICD-10-CM | POA: Diagnosis present

## 2017-09-16 DIAGNOSIS — I252 Old myocardial infarction: Secondary | ICD-10-CM | POA: Diagnosis not present

## 2017-09-16 DIAGNOSIS — Z9049 Acquired absence of other specified parts of digestive tract: Secondary | ICD-10-CM

## 2017-09-16 DIAGNOSIS — Z1611 Resistance to penicillins: Secondary | ICD-10-CM | POA: Diagnosis present

## 2017-09-16 DIAGNOSIS — J9601 Acute respiratory failure with hypoxia: Secondary | ICD-10-CM | POA: Diagnosis present

## 2017-09-16 DIAGNOSIS — G4730 Sleep apnea, unspecified: Secondary | ICD-10-CM | POA: Diagnosis present

## 2017-09-16 DIAGNOSIS — J449 Chronic obstructive pulmonary disease, unspecified: Secondary | ICD-10-CM | POA: Diagnosis present

## 2017-09-16 DIAGNOSIS — Z87442 Personal history of urinary calculi: Secondary | ICD-10-CM | POA: Diagnosis not present

## 2017-09-16 DIAGNOSIS — E1165 Type 2 diabetes mellitus with hyperglycemia: Secondary | ICD-10-CM | POA: Diagnosis present

## 2017-09-16 DIAGNOSIS — N3941 Urge incontinence: Secondary | ICD-10-CM | POA: Diagnosis present

## 2017-09-16 DIAGNOSIS — Z955 Presence of coronary angioplasty implant and graft: Secondary | ICD-10-CM

## 2017-09-16 DIAGNOSIS — E7800 Pure hypercholesterolemia, unspecified: Secondary | ICD-10-CM | POA: Diagnosis present

## 2017-09-16 DIAGNOSIS — I4892 Unspecified atrial flutter: Secondary | ICD-10-CM | POA: Diagnosis present

## 2017-09-16 DIAGNOSIS — Z794 Long term (current) use of insulin: Secondary | ICD-10-CM

## 2017-09-16 DIAGNOSIS — A419 Sepsis, unspecified organism: Secondary | ICD-10-CM | POA: Diagnosis present

## 2017-09-16 DIAGNOSIS — I9581 Postprocedural hypotension: Secondary | ICD-10-CM | POA: Diagnosis not present

## 2017-09-16 DIAGNOSIS — Z950 Presence of cardiac pacemaker: Secondary | ICD-10-CM

## 2017-09-16 DIAGNOSIS — I509 Heart failure, unspecified: Secondary | ICD-10-CM

## 2017-09-16 DIAGNOSIS — Z9010 Acquired absence of unspecified breast and nipple: Secondary | ICD-10-CM

## 2017-09-16 DIAGNOSIS — R0902 Hypoxemia: Secondary | ICD-10-CM

## 2017-09-16 DIAGNOSIS — K5900 Constipation, unspecified: Secondary | ICD-10-CM | POA: Diagnosis not present

## 2017-09-16 HISTORY — PX: CYSTOSCOPY W/ URETERAL STENT PLACEMENT: SHX1429

## 2017-09-16 HISTORY — PX: CYSTOSCOPY/RETROGRADE/URETEROSCOPY/STONE EXTRACTION WITH BASKET: SHX5317

## 2017-09-16 LAB — GLUCOSE, CAPILLARY
GLUCOSE-CAPILLARY: 147 mg/dL — AB (ref 65–99)
GLUCOSE-CAPILLARY: 211 mg/dL — AB (ref 65–99)
Glucose-Capillary: 168 mg/dL — ABNORMAL HIGH (ref 65–99)
Glucose-Capillary: 192 mg/dL — ABNORMAL HIGH (ref 65–99)

## 2017-09-16 LAB — LACTIC ACID, PLASMA
LACTIC ACID, VENOUS: 1.5 mmol/L (ref 0.5–1.9)
Lactic Acid, Venous: 2.3 mmol/L (ref 0.5–1.9)

## 2017-09-16 LAB — MRSA PCR SCREENING: MRSA BY PCR: NEGATIVE

## 2017-09-16 SURGERY — CYSTOSCOPY, WITH CALCULUS REMOVAL USING BASKET
Anesthesia: General | Site: Ureter | Laterality: Left | Wound class: Clean Contaminated

## 2017-09-16 MED ORDER — IPRATROPIUM-ALBUTEROL 0.5-2.5 (3) MG/3ML IN SOLN
RESPIRATORY_TRACT | Status: AC
  Administered 2017-09-16: 3 mL via RESPIRATORY_TRACT
  Filled 2017-09-16: qty 3

## 2017-09-16 MED ORDER — FAMOTIDINE 20 MG PO TABS
20.0000 mg | ORAL_TABLET | Freq: Once | ORAL | Status: AC
  Administered 2017-09-16: 20 mg via ORAL

## 2017-09-16 MED ORDER — IPRATROPIUM-ALBUTEROL 0.5-2.5 (3) MG/3ML IN SOLN
RESPIRATORY_TRACT | Status: AC
  Filled 2017-09-16: qty 3

## 2017-09-16 MED ORDER — SODIUM CHLORIDE 0.9 % IV SOLN
0.0000 ug/min | INTRAVENOUS | Status: DC
  Administered 2017-09-16: 60 ug/min via INTRAVENOUS
  Administered 2017-09-16: 100 ug/min via INTRAVENOUS
  Filled 2017-09-16 (×2): qty 40

## 2017-09-16 MED ORDER — ASPIRIN EC 81 MG PO TBEC
81.0000 mg | DELAYED_RELEASE_TABLET | Freq: Every day | ORAL | Status: DC
  Filled 2017-09-16: qty 1

## 2017-09-16 MED ORDER — ROPINIROLE HCL 1 MG PO TABS
5.0000 mg | ORAL_TABLET | Freq: Every day | ORAL | Status: DC
  Administered 2017-09-16 – 2017-09-22 (×7): 5 mg via ORAL
  Filled 2017-09-16 (×8): qty 5

## 2017-09-16 MED ORDER — SODIUM CHLORIDE 0.9 % IV BOLUS (SEPSIS)
1000.0000 mL | Freq: Once | INTRAVENOUS | Status: AC
  Administered 2017-09-16: 1000 mL via INTRAVENOUS

## 2017-09-16 MED ORDER — HYDROCORTISONE NA SUCCINATE PF 100 MG IJ SOLR
50.0000 mg | Freq: Four times a day (QID) | INTRAMUSCULAR | Status: DC
  Administered 2017-09-16 – 2017-09-17 (×3): 50 mg via INTRAVENOUS
  Filled 2017-09-16 (×3): qty 2

## 2017-09-16 MED ORDER — ALBUTEROL SULFATE (2.5 MG/3ML) 0.083% IN NEBU
2.5000 mg | INHALATION_SOLUTION | RESPIRATORY_TRACT | Status: DC
  Administered 2017-09-16 – 2017-09-18 (×8): 2.5 mg via RESPIRATORY_TRACT
  Filled 2017-09-16 (×8): qty 3

## 2017-09-16 MED ORDER — PROPOFOL 10 MG/ML IV BOLUS
INTRAVENOUS | Status: AC
  Filled 2017-09-16: qty 20

## 2017-09-16 MED ORDER — FAMOTIDINE 20 MG PO TABS
ORAL_TABLET | ORAL | Status: AC
  Administered 2017-09-16: 08:00:00
  Filled 2017-09-16: qty 1

## 2017-09-16 MED ORDER — DEXTROSE 5 % IV SOLN
1.0000 g | INTRAVENOUS | Status: DC
  Administered 2017-09-16 – 2017-09-17 (×2): 1 g via INTRAVENOUS
  Filled 2017-09-16 (×3): qty 10

## 2017-09-16 MED ORDER — CITALOPRAM HYDROBROMIDE 20 MG PO TABS
40.0000 mg | ORAL_TABLET | Freq: Every day | ORAL | Status: DC
Start: 2017-09-17 — End: 2017-09-23
  Administered 2017-09-17 – 2017-09-22 (×6): 40 mg via ORAL
  Filled 2017-09-16 (×6): qty 2

## 2017-09-16 MED ORDER — TROSPIUM CHLORIDE 20 MG PO TABS: 20.0000 mg | ORAL_TABLET | Freq: Two times a day (BID) | ORAL | 0 refills | Status: DC | PRN

## 2017-09-16 MED ORDER — ASPIRIN EC 81 MG PO TBEC
81.0000 mg | DELAYED_RELEASE_TABLET | Freq: Every day | ORAL | Status: DC
  Administered 2017-09-17: 81 mg via ORAL
  Filled 2017-09-16: qty 1

## 2017-09-16 MED ORDER — TAMSULOSIN HCL 0.4 MG PO CAPS
0.4000 mg | ORAL_CAPSULE | Freq: Every day | ORAL | Status: DC
  Administered 2017-09-17 – 2017-09-23 (×7): 0.4 mg via ORAL
  Filled 2017-09-16 (×7): qty 1

## 2017-09-16 MED ORDER — IPRATROPIUM-ALBUTEROL 0.5-2.5 (3) MG/3ML IN SOLN
3.0000 mL | Freq: Once | RESPIRATORY_TRACT | Status: AC
  Administered 2017-09-16: 3 mL via RESPIRATORY_TRACT

## 2017-09-16 MED ORDER — ONDANSETRON HCL 4 MG/2ML IJ SOLN
4.0000 mg | Freq: Once | INTRAMUSCULAR | Status: AC | PRN
  Administered 2017-09-16 (×2): 4 mg via INTRAVENOUS

## 2017-09-16 MED ORDER — ONDANSETRON HCL 4 MG/2ML IJ SOLN: 4.0000 mg | Freq: Four times a day (QID) | INTRAMUSCULAR | Status: DC | PRN

## 2017-09-16 MED ORDER — DEXAMETHASONE SODIUM PHOSPHATE 10 MG/ML IJ SOLN
INTRAMUSCULAR | Status: AC
  Filled 2017-09-16: qty 1

## 2017-09-16 MED ORDER — ALBUTEROL SULFATE (2.5 MG/3ML) 0.083% IN NEBU: 2.5000 mg | INHALATION_SOLUTION | RESPIRATORY_TRACT | Status: DC | PRN

## 2017-09-16 MED ORDER — ACETAMINOPHEN 500 MG PO TABS
ORAL_TABLET | ORAL | Status: AC
  Administered 2017-09-16: 500 mg via ORAL
  Filled 2017-09-16: qty 1

## 2017-09-16 MED ORDER — LIDOCAINE HCL (CARDIAC) 20 MG/ML IV SOLN
INTRAVENOUS | Status: DC | PRN
  Administered 2017-09-16: 60 mg via INTRAVENOUS

## 2017-09-16 MED ORDER — FENTANYL CITRATE (PF) 100 MCG/2ML IJ SOLN
INTRAMUSCULAR | Status: DC | PRN
  Administered 2017-09-16 (×3): 25 ug via INTRAVENOUS

## 2017-09-16 MED ORDER — IOTHALAMATE MEGLUMINE 43 % IV SOLN
INTRAVENOUS | Status: DC | PRN
  Administered 2017-09-16: 15 mL via URETHRAL

## 2017-09-16 MED ORDER — APIXABAN 5 MG PO TABS
5.0000 mg | ORAL_TABLET | Freq: Two times a day (BID) | ORAL | Status: DC
  Administered 2017-09-16 – 2017-09-23 (×14): 5 mg via ORAL
  Filled 2017-09-16 (×14): qty 1

## 2017-09-16 MED ORDER — ALBUTEROL SULFATE (2.5 MG/3ML) 0.083% IN NEBU
2.5000 mg | INHALATION_SOLUTION | Freq: Four times a day (QID) | RESPIRATORY_TRACT | Status: DC
  Administered 2017-09-16: 2.5 mg via RESPIRATORY_TRACT
  Filled 2017-09-16: qty 3

## 2017-09-16 MED ORDER — FENTANYL CITRATE (PF) 100 MCG/2ML IJ SOLN
INTRAMUSCULAR | Status: AC
  Filled 2017-09-16: qty 2

## 2017-09-16 MED ORDER — VITAMIN D 1000 UNITS PO TABS
2000.0000 [IU] | ORAL_TABLET | Freq: Every day | ORAL | Status: DC
  Administered 2017-09-17 – 2017-09-23 (×7): 2000 [IU] via ORAL
  Filled 2017-09-16 (×7): qty 2

## 2017-09-16 MED ORDER — ACETAMINOPHEN 500 MG PO TABS
500.0000 mg | ORAL_TABLET | Freq: Once | ORAL | Status: AC
  Administered 2017-09-16: 500 mg via ORAL

## 2017-09-16 MED ORDER — TRAMADOL HCL 50 MG PO TABS
50.0000 mg | ORAL_TABLET | Freq: Four times a day (QID) | ORAL | Status: DC | PRN
  Administered 2017-09-18: 50 mg via ORAL
  Filled 2017-09-16: qty 1

## 2017-09-16 MED ORDER — FENTANYL CITRATE (PF) 100 MCG/2ML IJ SOLN: 25.0000 ug | INTRAMUSCULAR | Status: DC | PRN

## 2017-09-16 MED ORDER — SODIUM CHLORIDE 0.9 % IV SOLN
0.0000 ug/min | INTRAVENOUS | Status: DC
  Administered 2017-09-16: 120 ug/min via INTRAVENOUS
  Administered 2017-09-16: 70 ug/min via INTRAVENOUS
  Filled 2017-09-16 (×2): qty 10

## 2017-09-16 MED ORDER — ONDANSETRON HCL 4 MG/2ML IJ SOLN
INTRAMUSCULAR | Status: DC | PRN
  Administered 2017-09-16: 4 mg via INTRAVENOUS

## 2017-09-16 MED ORDER — POLYETHYLENE GLYCOL 3350 17 G PO PACK: 17.0000 g | PACK | Freq: Every day | ORAL | Status: DC | PRN

## 2017-09-16 MED ORDER — ONDANSETRON HCL 4 MG PO TABS: 4.0000 mg | ORAL_TABLET | Freq: Four times a day (QID) | ORAL | Status: DC | PRN

## 2017-09-16 MED ORDER — SODIUM CHLORIDE 0.9 % IV SOLN
INTRAVENOUS | Status: DC
  Administered 2017-09-16: 09:00:00 via INTRAVENOUS

## 2017-09-16 MED ORDER — LEVOTHYROXINE SODIUM 112 MCG PO TABS
112.0000 ug | ORAL_TABLET | Freq: Every day | ORAL | Status: DC
  Administered 2017-09-17 – 2017-09-23 (×7): 112 ug via ORAL
  Filled 2017-09-16 (×7): qty 1

## 2017-09-16 MED ORDER — INSULIN GLARGINE 100 UNIT/ML ~~LOC~~ SOLN
50.0000 [IU] | Freq: Every day | SUBCUTANEOUS | Status: DC
Start: 2017-09-16 — End: 2017-09-19
  Administered 2017-09-16 – 2017-09-18 (×3): 50 [IU] via SUBCUTANEOUS
  Filled 2017-09-16 (×4): qty 0.5

## 2017-09-16 MED ORDER — ROSUVASTATIN CALCIUM 10 MG PO TABS
10.0000 mg | ORAL_TABLET | Freq: Every day | ORAL | Status: DC
  Administered 2017-09-16 – 2017-09-22 (×7): 10 mg via ORAL
  Filled 2017-09-16 (×7): qty 1

## 2017-09-16 MED ORDER — ONDANSETRON HCL 4 MG/2ML IJ SOLN
INTRAMUSCULAR | Status: AC
  Filled 2017-09-16: qty 2

## 2017-09-16 MED ORDER — PHENYLEPHRINE HCL 10 MG/ML IJ SOLN
INTRAMUSCULAR | Status: DC | PRN
  Administered 2017-09-16 (×2): 100 ug via INTRAVENOUS
  Administered 2017-09-16: 200 ug via INTRAVENOUS

## 2017-09-16 MED ORDER — CEFAZOLIN SODIUM-DEXTROSE 2-4 GM/100ML-% IV SOLN
INTRAVENOUS | Status: AC
  Filled 2017-09-16: qty 100

## 2017-09-16 MED ORDER — VITAMIN D 1000 UNITS PO TABS
2000.0000 [IU] | ORAL_TABLET | Freq: Every day | ORAL | Status: DC
  Filled 2017-09-16: qty 2

## 2017-09-16 MED ORDER — METHYLPREDNISOLONE SODIUM SUCC 125 MG IJ SOLR
60.0000 mg | INTRAMUSCULAR | Status: DC
  Administered 2017-09-16: 60 mg via INTRAVENOUS

## 2017-09-16 MED ORDER — ONDANSETRON HCL 4 MG/2ML IJ SOLN
INTRAMUSCULAR | Status: AC
  Administered 2017-09-16: 4 mg via INTRAVENOUS
  Filled 2017-09-16: qty 2

## 2017-09-16 MED ORDER — ACETAMINOPHEN 325 MG PO TABS
650.0000 mg | ORAL_TABLET | Freq: Four times a day (QID) | ORAL | Status: DC | PRN
  Administered 2017-09-16 – 2017-09-18 (×2): 650 mg via ORAL
  Filled 2017-09-16: qty 2

## 2017-09-16 MED ORDER — ACETAMINOPHEN 650 MG RE SUPP: 650.0000 mg | Freq: Four times a day (QID) | RECTAL | Status: DC | PRN

## 2017-09-16 MED ORDER — METHYLPREDNISOLONE SODIUM SUCC 125 MG IJ SOLR
INTRAMUSCULAR | Status: AC
  Administered 2017-09-16: 60 mg via INTRAVENOUS
  Filled 2017-09-16: qty 2

## 2017-09-16 MED ORDER — DEXAMETHASONE SODIUM PHOSPHATE 10 MG/ML IJ SOLN
INTRAMUSCULAR | Status: DC | PRN
  Administered 2017-09-16: 6 mg via INTRAVENOUS

## 2017-09-16 MED ORDER — IPRATROPIUM-ALBUTEROL 0.5-2.5 (3) MG/3ML IN SOLN
3.0000 mL | RESPIRATORY_TRACT | Status: DC
  Administered 2017-09-16: 3 mL via RESPIRATORY_TRACT

## 2017-09-16 MED ORDER — SODIUM CHLORIDE 0.9 % IV SOLN: Freq: Once | INTRAVENOUS | Status: DC

## 2017-09-16 MED ORDER — PROPOFOL 10 MG/ML IV BOLUS
INTRAVENOUS | Status: DC | PRN
  Administered 2017-09-16: 120 mg via INTRAVENOUS

## 2017-09-16 MED ORDER — PHENYLEPHRINE HCL 10 MG/ML IJ SOLN
0.0000 ug/min | INTRAMUSCULAR | Status: DC
  Administered 2017-09-16: 70 ug/min via INTRAVENOUS
  Administered 2017-09-16: 20 ug/min via INTRAVENOUS
  Filled 2017-09-16: qty 10

## 2017-09-16 MED ORDER — LIDOCAINE HCL (PF) 2 % IJ SOLN
INTRAMUSCULAR | Status: AC
  Filled 2017-09-16: qty 10

## 2017-09-16 MED ORDER — ACETAMINOPHEN 325 MG PO TABS
ORAL_TABLET | ORAL | Status: AC
  Administered 2017-09-16: 650 mg via ORAL
  Filled 2017-09-16: qty 2

## 2017-09-16 SURGICAL SUPPLY — 27 items
BAG DRAIN CYSTO-URO LG1000N (MISCELLANEOUS) ×4 IMPLANT
BASKET ZERO TIP 1.9FR (BASKET) ×4 IMPLANT
BRUSH SCRUB EZ 1% IODOPHOR (MISCELLANEOUS) ×4 IMPLANT
CATH URETL 5X70 OPEN END (CATHETERS) IMPLANT
CNTNR SPEC 2.5X3XGRAD LEK (MISCELLANEOUS) ×2
CONRAY 43 FOR UROLOGY 50M (MISCELLANEOUS) ×4 IMPLANT
CONT SPEC 4OZ STER OR WHT (MISCELLANEOUS) ×2
CONTAINER SPEC 2.5X3XGRAD LEK (MISCELLANEOUS) ×2 IMPLANT
DRAPE UTILITY 15X26 TOWEL STRL (DRAPES) ×4 IMPLANT
GLOVE BIO SURGEON STRL SZ8 (GLOVE) ×4 IMPLANT
GOWN STRL REUS W/ TWL LRG LVL3 (GOWN DISPOSABLE) ×4 IMPLANT
GOWN STRL REUS W/TWL LRG LVL3 (GOWN DISPOSABLE) ×4
GUIDEWIRE GREEN .038 145CM (MISCELLANEOUS) IMPLANT
INFUSOR MANOMETER BAG 3000ML (MISCELLANEOUS) ×4 IMPLANT
INTRODUCER DILATOR DOUBLE (INTRODUCER) IMPLANT
KIT RM TURNOVER CYSTO AR (KITS) ×4 IMPLANT
PACK CYSTO AR (MISCELLANEOUS) ×4 IMPLANT
SENSORWIRE 0.038 NOT ANGLED (WIRE) ×4
SET CYSTO W/LG BORE CLAMP LF (SET/KITS/TRAYS/PACK) ×4 IMPLANT
SHEATH URETERAL 12FRX35CM (MISCELLANEOUS) IMPLANT
SOL .9 NS 3000ML IRR  AL (IV SOLUTION) ×2
SOL .9 NS 3000ML IRR UROMATIC (IV SOLUTION) ×2 IMPLANT
STENT URET 6FRX24 CONTOUR (STENTS) ×4 IMPLANT
STENT URET 6FRX26 CONTOUR (STENTS) IMPLANT
SURGILUBE 2OZ TUBE FLIPTOP (MISCELLANEOUS) ×4 IMPLANT
WATER STERILE IRR 1000ML POUR (IV SOLUTION) ×4 IMPLANT
WIRE SENSOR 0.038 NOT ANGLED (WIRE) ×2 IMPLANT

## 2017-09-16 NOTE — Transfer of Care (Signed)
Immediate Anesthesia Transfer of Care Note  Patient: Cassandra [name]  Procedure(s) Performed: CYSTOSCOPY/RETROGRADE/URETEROSCOPY/STONE EXTRACTION WITH BASKET (Left Ureter) CYSTOSCOPY WITH STENT REPLACEMENT (Left Ureter)  Patient Location: PACU  Anesthesia Type:General  Level of Consciousness: drowsy  Airway & Oxygen Therapy: Patient Spontanous Breathing and Patient connected to face mask oxygen  Post-op Assessment: Report given to RN and Post -op Vital signs reviewed and stable  Post vital signs: Reviewed and stable  Last Vitals:  Vitals:   09/16/17 0815 09/16/17 0925  BP: (!) 114/47 (P) 116/60  Pulse: 69   Resp: 17   Temp: 37.1 C (P) 37.1 C  SpO2: 96%     Last Pain:  Vitals:   09/16/17 0815  TempSrc: Temporal  PainSc: 0-No pain         Complications: No apparent anesthesia complications

## 2017-09-16 NOTE — Anesthesia Post-op Follow-up Note (Signed)
Anesthesia QCDR form completed.        

## 2017-09-16 NOTE — Op Note (Signed)
Preoperative diagnosis: Left distal ureteral calculus  Postoperative diagnosis: Left distal ureteral calculus  Procedure:  1. Cystoscopy 2. Left ureteroscopy and stone removal 3. Left ureteral stent placement 4. Left retrograde pyelography with interpretation   Surgeon: Nicki Reaper C. Stoioff, M.D.  Anesthesia: General  Complications: None  Intraoperative findings:  1.  Left retrograde pyelography post procedure showed no filling defects, stone fragments or contrast extravasation  EBL: Minimal  Specimens: 1. Calculus fragments for analysis   Indication: Cassandra [name] is a 84 y.o. year old patient with urolithiasis.  She underwent placement of a left ureteral stent on 08/17/2017 for an obstructing 4 mm left distal ureteral calculus with sepsis.  After reviewing the management options for treatment, the patient elected to proceed with the above surgical procedure(s). We have discussed the potential benefits and risks of the procedure, side effects of the proposed treatment, the likelihood of the patient achieving the goals of the procedure, and any potential problems that might occur during the procedure or recuperation. Informed consent has been obtained.  Description of procedure:  The patient was taken to the operating room and general anesthesia was induced.  The patient was placed in the dorsal lithotomy position, prepped and draped in the usual sterile fashion, and preoperative antibiotics were administered. A preoperative time-out was performed.   A 22 French cystoscope was lubricated and passed under direct vision.  The urethra was normal in caliber without stricture.    Panendoscopy was performed and the bladder mucosa showed no solid or papillary lesions.  There were inflammatory changes at the left ureteral orifice secondary to the indwelling stent  Attention was directed to the left ureteral orifice and the stent was grasped with endoscopic forceps and brought out to the  urethral meatus.  A 0.038 Sensor wire was then advanced through the stent and into the renal pelvis under fluoroscopic guidance.  A 6 Fr semirigid ureteroscope was then advanced into the ureter next to the guidewire and the calculus was identified.  The stone was grasped with a 1.9 [place] nitinol basket and removed without difficulty.  Left retrograde pyelogram was performed through the ureteroscope with findings as described above.  Retrograde pyelogram was performed with findings as described above.  A 6 FR/ 24 CM stent with dangler was placed under fluoroscopic guidance.  The wire was then removed with an adequate stent curl noted in the renal pelvis as well as in the bladder.  The bladder was then emptied and the procedure ended.  The patient appeared to tolerate the procedure well and without complications.  After anesthetic reversal the patient was transported to the PACU in stable condition.   Plan: She will return to the office on 1/18 for stent removal.

## 2017-09-16 NOTE — Anesthesia Procedure Notes (Signed)
Procedure Name: LMA Insertion Date/Time: 09/16/2017 8:48 AM Performed by: Eben Burow, CRNA Pre-anesthesia Checklist: Patient identified, Emergency Drugs available, Suction available, Patient being monitored and Timeout performed Patient Re-evaluated:Patient Re-evaluated prior to induction Oxygen Delivery Method: Circle system utilized Preoxygenation: Pre-oxygenation with 100% oxygen Induction Type: IV induction Ventilation: Mask ventilation without difficulty LMA: LMA inserted LMA Size: 4.0 Number of attempts: 1 Placement Confirmation: positive ETCO2 and breath sounds checked- equal and bilateral Tube secured with: Tape Dental Injury: Teeth and Oropharynx as per pre-operative assessment

## 2017-09-16 NOTE — Care Management (Addendum)
RNCM spoke with ICU nurse regarding flag for discharge today. Per nurse, patient will not discharge today.

## 2017-09-16 NOTE — H&P (Signed)
[place] [facility] [place] NAME: Cassandra [name]    MR#:  [id number]  DATE OF BIRTH:  [birth year]  DATE OF ADMISSION:  09/16/2017  PRIMARY CARE PHYSICIAN: Idelle Crouch, MD   REQUESTING/REFERRING PHYSICIAN: Dr Rosey Bath  CHIEF COMPLAINT:   Postop respiratory distress and hypoxia HISTORY OF PRESENT ILLNESS:  Cassandra [name]  is a 84 y.o. female with a known history of COPD/sleep apnea on CPAP at home, chronic pulmonary fibrosis, CAD, history of kidney stones was admitted for elective left ureteral distal calculus removal and stent placement.  Patient underwent procedure under general anesthesia.  Prior to her procedure her sats were 95% on room air.  Post surgery in PACU patient became hypoxic with sats dropping into the 80s.  She received several rounds of nebulizer placed on nonrebreather.  Patient was getting fatigued because of shortness of breath and appeared very tight and placed on BiPAP.  By the time I went to see the patient she her sats were 93-95% on BiPAP.  She was tolerating it very well.  Her blood pressure dropped in the 70s.  She received a bolus of 500 normal saline and was going to be started on Neo-Synephrine.  Orders were given by anesthesia.  Overall looks better proving slowly she is being admitted to stepdown with acute toxic respiratory failure in the postop setting.  PAST MEDICAL HISTORY:   Past Medical History:  Diagnosis Date  . Acquired hypothyroidism 01/06/2014  . Arthritis   . B12 deficiency 02/15/2014  . Benign essential hypertension 01/06/2014  . Breast cancer [facility]) 1982   Right breast cancer - chemotherapy  . CAD (coronary artery disease), native coronary artery 01/06/2014  . Carotid artery calcification   . Chronic airway obstruction, not elsewhere classified 01/06/2014  . Depression   . Diabetes mellitus without complication ([place])   . History of kidney stones 2019   left ureteral stone  . Myocardial infarction  ([place]) 1982   small infarct  . Presence of permanent cardiac pacemaker 07/2016  . Pure hypercholesterolemia 01/06/2014  . Skin cancer   . Sleep apnea    uses cpap    PAST SURGICAL HISTOIRY:   Past Surgical History:  Procedure Laterality Date  . APPENDECTOMY    . AUGMENTATION MAMMAPLASTY Bilateral 1982/redo in 2014  . CARDIAC CATHETERIZATION N/A 04/23/2016   Procedure: Left Heart Cath and Coronary Angiography;  Surgeon: Corey Skains, MD;  Location: [place] CV LAB;  Service: Cardiovascular;  Laterality: N/A;  . CARDIAC CATHETERIZATION Left 04/23/2016   Procedure: Coronary Stent Intervention;  Surgeon: Yolonda Kida, MD;  Location: [place] CV LAB;  Service: Cardiovascular;  Laterality: Left;  . CAROTID ARTERY ANGIOPLASTY Left 2010   had stent inserted and removed d/t infection. artery from leg inserted in left carotid  . CHOLECYSTECTOMY    . CORONARY ANGIOPLASTY WITH STENT PLACEMENT  september 9th 2017  . CYSTOSCOPY W/ URETERAL STENT PLACEMENT Left 08/17/2017   Procedure: CYSTOSCOPY WITH RETROGRADE PYELOGRAM/URETERAL STENT PLACEMENT;  Surgeon: Festus Aloe, MD;  Location: [facility] ORS;  Service: Urology;  Laterality: Left;  . EYE SURGERY Bilateral    cataract extraction  . MASTECTOMY Right 1982  . PACEMAKER INSERTION Left 07/03/2016   Procedure: INSERTION PACEMAKER;  Surgeon: Isaias Cowman, MD;  Location: [facility] ORS;  Service: Cardiovascular;  Laterality: Left;  . SKIN CANCER EXCISION    . TUBAL LIGATION      SOCIAL HISTORY:   Social History   Tobacco Use  .  Smoking status: Former Smoker    Packs/day: 0.50    Types: Cigarettes    Last attempt to quit: 09/02/1996    Years since quitting: 21.0  . Smokeless tobacco: Never Used  Substance Use Topics  . Alcohol use: Yes    Alcohol/week: 0.6 oz    Types: 1 Glasses of wine per week    Comment: occasionally    FAMILY HISTORY:   Family History  Problem Relation Age of Onset  . Prostate cancer Neg Hx   .  Kidney cancer Neg Hx   . Breast cancer Neg Hx     DRUG ALLERGIES:  No Known Allergies  REVIEW OF SYSTEMS:  Review of Systems  Constitutional: Negative for chills, fever and weight loss.  HENT: Negative for ear discharge, ear pain and nosebleeds.   Eyes: Negative for blurred vision, pain and discharge.  Respiratory: Positive for shortness of breath. Negative for sputum production, wheezing and stridor.   Cardiovascular: Negative for chest pain, palpitations, orthopnea and PND.  Gastrointestinal: Negative for abdominal pain, diarrhea, nausea and vomiting.  Genitourinary: Negative for frequency and urgency.  Musculoskeletal: Negative for back pain and joint pain.  Neurological: Positive for weakness. Negative for sensory change, speech change and focal weakness.  Psychiatric/Behavioral: Negative for depression and hallucinations. The patient is not nervous/anxious.      MEDICATIONS AT HOME:   Prior to Admission medications   Medication Sig Start Date End Date Taking? Authorizing Provider  apixaban (ELIQUIS) 5 MG TABS tablet Take 1 tablet (5 mg total) by mouth 2 (two) times daily. Patient taking differently: Take 5 mg by mouth 2 (two) times daily.  06/16/16  Yes Bettey Costa, MD  aspirin EC 81 MG tablet Take 81 mg by mouth daily.   Yes [provider]  Cholecalciferol (VITAMIN D3) 2000 units TABS Take 2,000 Units by mouth daily.   Yes [provider]  citalopram (CELEXA) 40 MG tablet TAKE ONE TABLET BY MOUTH EVERY NIGHT AT BEDTIME. 07/07/14  Yes [provider]  diltiazem (CARDIZEM CD) 180 MG 24 hr capsule Take 1 capsule (180 mg total) by mouth daily. 06/16/16  Yes Mody, Ulice Bold, MD  furosemide (LASIX) 40 MG tablet Take 40 mg by mouth daily.   Yes [provider]  HUMALOG KWIKPEN 100 UNIT/ML KiwkPen Inject 12 Units into the skin 2 (two) times daily before a meal. *Use as directed per sliding scale* 03/03/15  Yes [provider]  insulin glargine  (LANTUS) 100 UNIT/ML injection Inject 0.42 mLs (42 Units total) into the skin at bedtime. Patient taking differently: Inject 50 Units into the skin at bedtime.  06/16/16  Yes Mody, Ulice Bold, MD  levothyroxine (SYNTHROID, LEVOTHROID) 112 MCG tablet Take 1 tablet (112 mcg total) by mouth daily before breakfast. 06/17/16  Yes Mody, Sital, MD  metFORMIN (GLUCOPHAGE-XR) 500 MG 24 hr tablet Take 2 tablets (1,000 mg total) by mouth 2 (two) times daily. Please start this on 8/24 Patient taking differently: Take 1,000 mg by mouth 2 (two) times daily. Pl 04/24/16  Yes Mody, Sital, MD  ropinirole (REQUIP) 5 MG tablet Take 5 mg by mouth at bedtime.  03/29/15  Yes [provider]  rosuvastatin (CRESTOR) 10 MG tablet Take 10 mg by mouth at bedtime.  03/25/16  Yes [provider]  tamsulosin (FLOMAX) 0.4 MG CAPS capsule Take 0.4 mg by mouth daily.  09/03/17 09/03/18 Yes [provider]  nitroGLYCERIN (NITROSTAT) 0.4 MG SL tablet Place 1 tablet (0.4 mg total) under the tongue  every 5 (five) minutes as needed for chest pain. 04/24/16   Bettey Costa, MD  trospium (SANCTURA) 20 MG tablet Take 1 tablet (20 mg total) by mouth 2 (two) times daily as needed. For stent irritation 09/16/17   Stoioff, Ronda Fairly, MD      VITAL SIGNS:  Blood pressure (!) 83/42, pulse 88, temperature 98.7 F (37.1 C), resp. rate (!) 24, height 5\' 2"  (1.575 m), weight 75.8 kg (167 lb), SpO2 95 %.  PHYSICAL EXAMINATION:  GENERAL:  84 y.o.-year-old patient lying in the bed with no acute distress. Obese EYES: Pupils equal, round, reactive to light and accommodation. No scleral icterus. Extraocular muscles intact.  HEENT: Head atraumatic, normocephalic. Oropharynx and nasopharynx clear. BIPAP NECK:  Supple, no jugular venous distention. No thyroid enlargement, no tenderness.  LUNGS: Normal breath sounds bilaterally, no wheezing, rales,rhonchi or crepitation. No use of accessory muscles of respiration.  CARDIOVASCULAR: S1, S2  normal. No murmurs, rubs, or gallops.  ABDOMEN: Soft, nontender, nondistended. Bowel sounds present. No organomegaly or mass.  EXTREMITIES: No pedal edema, cyanosis, or clubbing.  NEUROLOGIC: Cranial nerves II through XII are intact. Muscle strength 5/5 in all extremities. Sensation intact. Gait not checked.  PSYCHIATRIC: The patient is alert and oriented x 3.  SKIN: No obvious rash, lesion, or ulcer.   LABORATORY PANEL:   CBC No results for input(s): WBC, HGB, HCT, PLT in the last 168 hours. ------------------------------------------------------------------------------------------------------------------  Chemistries  No results for input(s): NA, K, CL, CO2, GLUCOSE, BUN, CREATININE, CALCIUM, MG, AST, ALT, ALKPHOS, BILITOT in the last 168 hours.  Invalid input(s): GFRCGP ------------------------------------------------------------------------------------------------------------------  Cardiac Enzymes No results for input(s): TROPONINI in the last 168 hours. ------------------------------------------------------------------------------------------------------------------  RADIOLOGY:  Dg Chest 1 View  Result Date: 09/16/2017 CLINICAL DATA:  Hypoxemia post cystoscopy, history breast cancer, coronary artery disease post MI, diabetes mellitus, former smoker, hypertension EXAM: CHEST 1 VIEW PORTABLE COMPARISON:  Portable exam 1202 hours compared to 08/17/2017 FINDINGS: LEFT subclavian transvenous pacemaker leads project over RIGHT atrium and RIGHT ventricle. Minimal enlargement of cardiac silhouette. Atherosclerotic calcification aorta. Pulmonary vascularity normal. Chronic bronchitic and interstitial changes question fibrosis. Bibasilar atelectasis. No definite infiltrate, pleural effusion or pneumothorax. IMPRESSION: Chronic interstitial disease/fibrosis with bibasilar atelectasis. Electronically Signed   By: Lavonia Dana M.D.   On: 09/16/2017 12:21    EKG:    IMPRESSION AND PLAN:    Cassandra [name]  is a 84 y.o. female with a known history of COPD/sleep apnea on CPAP at home, chronic pulmonary fibrosis, CAD, history of kidney stones was admitted for elective left ureteral distal calculus removal and stent placement.  Patient underwent procedure under general anesthesia.  Prior to her procedure her sats were 95% on room air.  Post surgery in PACU patient became hypoxic with sats dropping into the 80s.    1.  Acute hypoxic respiratory failure in the setting of COPD/chronic pulmonary fibrosis -Patient currently is postop cystoscopy with left distal ureteral stone removal and stent placement by Dr. Bernardo Heater -Admit to stepdown ICU -Discussed with Dr. Jamal Collin -Nebulizer around-the-clock, BiPAP wean as tolerated -I will give her small dose of steroid  2.  Hypotension postop with BiPAP -Received bolus of 500 cc normal saline -Orders have been given by anesthesia to start Neo-Synephrine titrate to keep map greater than 65 -Hold BP meds -Continue some IV fluids  3.  history of chronic atrial flutter on oral anticoagulation. -We will resume Eliquis now the procedure is over  4.  Diabetes -Sliding scale insulin -Resume home meds  when able to  5.  DVT prophylaxis on Eliquis  Above was discussed with patient.  No family members present.  All the records are reviewed and case discussed with ED provider. Management plans discussed with the patient, family and they are in agreement.  CODE STATUS: Full  TOTAL critical TIME TAKING CARE OF THIS PATIENT: 50 minutes.    Fritzi Mandes M.D on 09/16/2017 at 1:16 PM  Between 7am to 6pm - Pager - [phone]  After 6pm go to [email or web address] - password [email or web address] [facility]  [facility]  Office  [phone]  CC: Primary care physician; Idelle Crouch, MD

## 2017-09-16 NOTE — Progress Notes (Signed)
Pharmacy Antibiotic Note  Cassandra [name] is a 84 y.o. female admitted on 09/16/2017 with pyelonephritis.  Pharmacy has been consulted for Ceftriaxone dosing.  Plan: Ceftriaxone 1g IV q24h  Height: 5\' 2"  (157.5 cm) Weight: 167 lb (75.8 kg) IBW/kg (Calculated) : 50.1  Temp (24hrs), Avg:100.2 F (37.9 C), Min:98.7 F (37.1 C), Max:102.1 F (38.9 C)  No results for input(s): WBC, CREATININE, LATICACIDVEN, VANCOTROUGH, VANCOPEAK, VANCORANDOM, GENTTROUGH, GENTPEAK, GENTRANDOM, TOBRATROUGH, TOBRAPEAK, TOBRARND, AMIKACINPEAK, AMIKACINTROU, AMIKACIN in the last 168 hours.  CrCl cannot be calculated (Patient's most recent lab result is older than the maximum 21 days allowed.).    No Known Allergies  Antimicrobials this admission: Ceftriaxone 1/15 >>     Thank you for allowing pharmacy to be a part of this patient's care.  Paulina Fusi, PharmD, BCPS 09/16/2017 4:14 PM

## 2017-09-16 NOTE — Interval H&P Note (Signed)
History and Physical Interval Note: Lungs clear; CV RRR.  Site marked  09/16/2017 8:19 AM  Cassandra [name]  has presented today for surgery, with the diagnosis of Left ureteral stone  The various methods of treatment have been discussed with the patient and family. After consideration of risks, benefits and other options for treatment, the patient has consented to  Procedure(s): CYSTOSCOPY/URETEROSCOPY/HOLMIUM LASER/STENT Exchange (Left) as a surgical intervention .  The patient's history has been reviewed, patient examined, no change in status, stable for surgery.  I have reviewed the patient's chart and labs.  Questions were answered to the patient's satisfaction.     [place]

## 2017-09-16 NOTE — Anesthesia Preprocedure Evaluation (Signed)
Anesthesia Evaluation  Patient identified by MRN, date of birth, ID band Patient awake    Reviewed: Allergy & Precautions, NPO status , Patient's Chart, lab work & pertinent test results  History of Anesthesia Complications Negative for: history of anesthetic complications  Airway Mallampati: III       Dental  (+) Partial Lower, Upper Dentures   Pulmonary sleep apnea and Continuous Positive Airway Pressure Ventilation , COPD, former smoker,  Pt complaining of SOB on admission, now on BIPAP          Cardiovascular hypertension, Pt. on medications + CAD, + Past MI and + Peripheral Vascular Disease  (-) CHF + pacemaker (-) Valvular Problems/Murmurs     Neuro/Psych neg Seizures PSYCHIATRIC DISORDERS    GI/Hepatic   Endo/Other  diabetes, Type 2, Oral Hypoglycemic AgentsHypothyroidism   Renal/GU Renal InsufficiencyRenal disease     Musculoskeletal   Abdominal   Peds  Hematology   Anesthesia Other Findings Past Medical History: 01/06/2014: Acquired hypothyroidism No date: Arthritis 02/15/2014: B12 deficiency 01/06/2014: Benign essential hypertension 1982: Breast cancer ([place])     Comment:  Right breast cancer - chemotherapy 01/06/2014: CAD (coronary artery disease), native coronary artery No date: Carotid artery calcification 01/06/2014: Chronic airway obstruction, not elsewhere classified No date: Depression No date: Diabetes mellitus without complication ([place]) 6468: History of kidney stones     Comment:  left ureteral stone 1982: Myocardial infarction [facility])     Comment:  small infarct 07/2016: Presence of permanent cardiac pacemaker 01/06/2014: Pure hypercholesterolemia No date: Skin cancer No date: Sleep apnea     Comment:  uses cpap   Reproductive/Obstetrics                             Anesthesia Physical  Anesthesia Plan  ASA: III and emergent  Anesthesia Plan: General   Post-op Pain  Management:    Induction: Intravenous  PONV Risk Score and Plan: 3 and Midazolam, Dexamethasone and Ondansetron  Airway Management Planned: LMA and Oral ETT  Additional Equipment:   Intra-op Plan:   Post-operative Plan: Possible Post-op intubation/ventilation and Extubation in OR  Informed Consent: I have reviewed the patients History and Physical, chart, labs and discussed the procedure including the risks, benefits and alternatives for the proposed anesthesia with the patient or authorized representative who has indicated his/her understanding and acceptance.     Plan Discussed with:   Anesthesia Plan Comments: (Pt with SOB and septic. Will possibly need post-op ventilation. This was explained to pt and surgeon and they wish to proceed.)        Anesthesia Quick Evaluation

## 2017-09-16 NOTE — Consult Note (Signed)
Name: Cassandra [name] MRN: [id number] DOB: Aug 17, 1933     CONSULTATION DATE: 09/16/2017  REFERRING MD :  Posey Pronto  CHIEF COMPLAINT:  resp distress  STUDIES:  CXR 09/16/17 I have Independently reviewed images of  cxr  on 09/16/2017 Interpretation:b/l interstitial opacities lower lobe predominant    HISTORY OF PRESENT ILLNESS:  84 yo WF admitted to the ICU for what further workup for hypotension Patient had and has history of kidney stones status post left ureteral distal calculus removal and stent replacement Upon further assessment and examination patient has hypotensive diaphoretic Patient noted to have acute respiratory distress was placed on BiPAP in the PACU and was transferred to the ICU where she is currently on 2 L nasal cannula Patient states she does not feel well however she does denies any chest pain nausea or vomiting Patient does have some shortness of breath Patient remains on vasopressors with a map of 65 Patient with a temperature of 102  Husband at bedside updated notified   PAST MEDICAL HISTORY :   has a past medical history of Acquired hypothyroidism (01/06/2014), Arthritis, B12 deficiency (02/15/2014), Benign essential hypertension (01/06/2014), Breast cancer ([place]) (1982), CAD (coronary artery disease), native coronary artery (01/06/2014), Carotid artery calcification, Chronic airway obstruction, not elsewhere classified (01/06/2014), Depression, Diabetes mellitus without complication ([place]), History of kidney stones (2019), Myocardial infarction ([place]) (1982), Presence of permanent cardiac pacemaker (07/2016), Pure hypercholesterolemia (01/06/2014), Skin cancer, and Sleep apnea.  has a past surgical history that includes Mastectomy (Right, 1982); Augmentation mammaplasty (Bilateral, 1982/redo in 2014); Carotid angioplasty (Left, 2010); Skin cancer excision; Cardiac catheterization (N/A, 04/23/2016); Cardiac catheterization (Left, 04/23/2016); Coronary angioplasty with stent  (september 9th 2017); Tubal ligation; Appendectomy; Cholecystectomy; Pacemaker insertion (Left, 07/03/2016); Cystoscopy w/ ureteral stent placement (Left, 08/17/2017); and Eye surgery (Bilateral). Prior to Admission medications   Medication Sig Start Date End Date Taking? Authorizing Provider  apixaban (ELIQUIS) 5 MG TABS tablet Take 1 tablet (5 mg total) by mouth 2 (two) times daily. Patient taking differently: Take 5 mg by mouth 2 (two) times daily.  06/16/16  Yes Bettey Costa, MD  aspirin EC 81 MG tablet Take 81 mg by mouth daily.   Yes [provider]  Cholecalciferol (VITAMIN D3) 2000 units TABS Take 2,000 Units by mouth daily.   Yes [provider]  citalopram (CELEXA) 40 MG tablet TAKE ONE TABLET BY MOUTH EVERY NIGHT AT BEDTIME. 07/07/14  Yes [provider]  diltiazem (CARDIZEM CD) 180 MG 24 hr capsule Take 1 capsule (180 mg total) by mouth daily. 06/16/16  Yes Mody, Ulice Bold, MD  furosemide (LASIX) 40 MG tablet Take 40 mg by mouth daily.   Yes [provider]  HUMALOG KWIKPEN 100 UNIT/ML KiwkPen Inject 12 Units into the skin 2 (two) times daily before a meal. *Use as directed per sliding scale* 03/03/15  Yes [provider]  insulin glargine (LANTUS) 100 UNIT/ML injection Inject 0.42 mLs (42 Units total) into the skin at bedtime. Patient taking differently: Inject 50 Units into the skin at bedtime.  06/16/16  Yes Mody, Ulice Bold, MD  levothyroxine (SYNTHROID, LEVOTHROID) 112 MCG tablet Take 1 tablet (112 mcg total) by mouth daily before breakfast. 06/17/16  Yes Mody, Sital, MD  metFORMIN (GLUCOPHAGE-XR) 500 MG 24 hr tablet Take 2 tablets (1,000 mg total) by mouth 2 (two) times daily. Please start this on 8/24 Patient taking differently: Take 1,000 mg by mouth 2 (two) times daily. Pl 04/24/16  Yes Mody, Ulice Bold, MD  ropinirole (REQUIP) 5 MG  tablet Take 5 mg by mouth at bedtime.  03/29/15  Yes [provider]  rosuvastatin (CRESTOR) 10 MG tablet Take 10  mg by mouth at bedtime.  03/25/16  Yes [provider]  tamsulosin (FLOMAX) 0.4 MG CAPS capsule Take 0.4 mg by mouth daily.  09/03/17 09/03/18 Yes [provider]  nitroGLYCERIN (NITROSTAT) 0.4 MG SL tablet Place 1 tablet (0.4 mg total) under the tongue every 5 (five) minutes as needed for chest pain. 04/24/16   Bettey Costa, MD  trospium (SANCTURA) 20 MG tablet Take 1 tablet (20 mg total) by mouth 2 (two) times daily as needed. For stent irritation 09/16/17   Stoioff, Ronda Fairly, MD   No Known Allergies  FAMILY HISTORY:  family history is not on file. SOCIAL HISTORY:  reports that she quit smoking about 21 years ago. Her smoking use included cigarettes. She smoked 0.50 packs per day. she has never used smokeless tobacco. She reports that she drinks about 0.6 oz of alcohol per week. She reports that she does not use drugs.  REVIEW OF SYSTEMS:   Constitutional: + malaise/fatigue and diaphoresis.  HENT: Negative for hearing loss, ear pain, nosebleeds, congestion, sore throat, neck pain, tinnitus and ear discharge.   Eyes: Negative for blurred vision, double vision, photophobia, pain, discharge and redness.  Respiratory: + shortness of breath,  Cardiovascular: Negative for chest pain, palpitations, orthopnea, claudication, leg swelling and PND.  Gastrointestinal:+nausea, +vomiting, Genitourinary: Negative for dysuria, urgency, frequency, hematuria and flank pain.  Musculoskeletal: Negative for myalgias, back pain, joint pain and falls.  Skin: Negative for itching and rash.  Neurological: Negative for dizziness, tingling, tremors, sensory change, speech change, focal weakness, seizures, loss of consciousness, weakness and headaches.  Endo/Heme/Allergies: Negative for environmental allergies and polydipsia. Does not bruise/bleed easily.  ALL OTHER ROS ARE NEGATIVE    VITAL SIGNS: Temp:  [98.7 F (37.1 C)-102.1 F (38.9 C)] 101.2 F (38.4 C) (01/15 1423) Pulse Rate:  [69-100] 86  (01/15 1423) Resp:  [12-27] 25 (01/15 1423) BP: (70-172)/(41-98) 99/50 (01/15 1423) SpO2:  [88 %-99 %] 93 % (01/15 1423) Weight:  [167 lb (75.8 kg)] 167 lb (75.8 kg) (01/15 0815)  Physical Examination:  GENERAL:critically ill appearing, +resp distress HEAD: Normocephalic, atraumatic.  EYES: Pupils equal, round, reactive to light.  No scleral icterus.  MOUTH: Moist mucosal membrane. NECK: Supple. No thyromegaly. No nodules. No JVD.  PULMONARY: CTA B/L CARDIOVASCULAR: S1 and S2. Regular rate and rhythm. No murmurs, rubs, or gallops.  GASTROINTESTINAL: Soft, nontender, -distended. No masses. Positive bowel sounds. No hepatosplenomegaly.  MUSCULOSKELETAL: +edema.  NEUROLOGIC: alert and awake, follows commands SKIN:intact,warm,dry   ASSESSMENT / PLAN: 84 year old white female admitted to the ICU for acute sepsis and septic shock secondary to infected distal kidney stone status post removal and stent replacement in the setting of underlying COPD and coronary artery disease with history of chronic pulmonary fibrosis  #1 continue oxygen as needed #2 continue vasopressor support will use peripheral Neo-Synephrine to keep map above 65 #3 check lactic acid and give IV fluids #4 Will consider stress dose steroids #5 start antibiotics, follow up blood cultures  Patient/Family are satisfied with Plan of action and management. All questions answered  Corrin Parker, M.D.  [facility]  Medical Director [place] Director [facility] Cardio-Pulmonary Department

## 2017-09-17 ENCOUNTER — Encounter: Payer: Self-pay | Admitting: Urology

## 2017-09-17 ENCOUNTER — Other Ambulatory Visit: Payer: Self-pay

## 2017-09-17 LAB — BASIC METABOLIC PANEL
Anion gap: 11 (ref 5–15)
BUN: 19 mg/dL (ref 6–20)
CALCIUM: 8.1 mg/dL — AB (ref 8.9–10.3)
CHLORIDE: 107 mmol/L (ref 101–111)
CO2: 22 mmol/L (ref 22–32)
CREATININE: 0.96 mg/dL (ref 0.44–1.00)
GFR calc non Af Amer: 54 mL/min — ABNORMAL LOW (ref >60)
Glucose, Bld: 368 mg/dL — ABNORMAL HIGH (ref 65–99)
Potassium: 4.1 mmol/L (ref 3.5–5.1)
SODIUM: 140 mmol/L (ref 135–145)

## 2017-09-17 LAB — MAGNESIUM: MAGNESIUM: 1.8 mg/dL (ref 1.7–2.4)

## 2017-09-17 LAB — GLUCOSE, CAPILLARY
Glucose-Capillary: 334 mg/dL — ABNORMAL HIGH (ref 65–99)
Glucose-Capillary: 387 mg/dL — ABNORMAL HIGH (ref 65–99)
Glucose-Capillary: 207 mg/dL — ABNORMAL HIGH (ref 65–99)
Glucose-Capillary: 259 mg/dL — ABNORMAL HIGH (ref 65–99)

## 2017-09-17 LAB — PHOSPHORUS: PHOSPHORUS: 3.4 mg/dL (ref 2.5–4.6)

## 2017-09-17 MED ORDER — METHYLPREDNISOLONE SODIUM SUCC 40 MG IJ SOLR
40.0000 mg | Freq: Two times a day (BID) | INTRAMUSCULAR | Status: DC
  Administered 2017-09-17 – 2017-09-18 (×2): 40 mg via INTRAVENOUS
  Filled 2017-09-17 (×2): qty 1

## 2017-09-17 MED ORDER — INSULIN ASPART 100 UNIT/ML ~~LOC~~ SOLN
0.0000 [IU] | Freq: Three times a day (TID) | SUBCUTANEOUS | Status: DC
  Administered 2017-09-17: 15 [IU] via SUBCUTANEOUS
  Administered 2017-09-17: 5 [IU] via SUBCUTANEOUS
  Administered 2017-09-18: 15 [IU] via SUBCUTANEOUS
  Administered 2017-09-18: 8 [IU] via SUBCUTANEOUS
  Administered 2017-09-18: 15 [IU] via SUBCUTANEOUS
  Administered 2017-09-19: 11 [IU] via SUBCUTANEOUS
  Administered 2017-09-20: 8 [IU] via SUBCUTANEOUS
  Administered 2017-09-20: 11 [IU] via SUBCUTANEOUS
  Administered 2017-09-20 – 2017-09-21 (×2): 2 [IU] via SUBCUTANEOUS
  Administered 2017-09-21: 8 [IU] via SUBCUTANEOUS
  Administered 2017-09-22 (×2): 5 [IU] via SUBCUTANEOUS
  Filled 2017-09-17 (×13): qty 1

## 2017-09-17 MED ORDER — INSULIN ASPART 100 UNIT/ML ~~LOC~~ SOLN
0.0000 [IU] | Freq: Every day | SUBCUTANEOUS | Status: DC
  Administered 2017-09-18: 5 [IU] via SUBCUTANEOUS
  Administered 2017-09-19: 4 [IU] via SUBCUTANEOUS
  Administered 2017-09-21: 2 [IU] via SUBCUTANEOUS
  Filled 2017-09-17 (×4): qty 1

## 2017-09-17 MED ORDER — INSULIN ASPART 100 UNIT/ML ~~LOC~~ SOLN
4.0000 [IU] | Freq: Three times a day (TID) | SUBCUTANEOUS | Status: DC
  Administered 2017-09-17 – 2017-09-18 (×3): 4 [IU] via SUBCUTANEOUS
  Filled 2017-09-17 (×4): qty 1

## 2017-09-17 NOTE — Progress Notes (Signed)
No distress Cognition intact No new complaints Off vasopressors  Vitals:   09/17/17 0800 09/17/17 0900 09/17/17 1100 09/17/17 1200  BP: (!) 130/94  110/65 (!) 106/47  Pulse: 73 (!) 115 74 76  Resp: (!) 23 (!) 21 (!) 24 18  Temp: 98 F (36.7 C)   (!) 97.5 F (36.4 C)  TempSrc: Axillary   Axillary  SpO2: 90% 92% 92% 90%  Weight:      Height:        Gen: WDWN in NAD HEENT: NCAT, sclerae white, oropharynx normal Neck: No LAN, no JVD noted Lungs: full BS, normal percussion note throughout, no adventitious sounds Cardiovascular: Regular, normal rate, no M noted Abdomen: Soft, NT, +BS Ext: no C/C/E Neuro: PERRL, EOMI, motor/sensory grossly intact Skin: No lesions noted   BMP Latest Ref Rng & Units 09/17/2017 08/20/2017 08/19/2017  Glucose 65 - 99 mg/dL 368(H) 122(H) 233(H)  BUN 6 - 20 mg/dL 19 20 23(H)  Creatinine 0.44 - 1.00 mg/dL 0.96 0.94 1.01(H)  Sodium 135 - 145 mmol/L 140 139 137  Potassium 3.5 - 5.1 mmol/L 4.1 3.8 4.1  Chloride 101 - 111 mmol/L 107 103 104  CO2 22 - 32 mmol/L 22 28 26   Calcium 8.9 - 10.3 mg/dL 8.1(L) 8.6(L) 8.4(L)    CBC Latest Ref Rng & Units 08/20/2017 08/19/2017 08/18/2017  WBC 3.6 - 11.0 K/uL 8.8 13.3(H) 17.1(H)  Hemoglobin 12.0 - 16.0 g/dL 10.8(L) 10.5(L) 10.9(L)  Hematocrit 35.0 - 47.0 % 33.7(L) 33.0(L) 34.2(L)  Platelets 150 - 440 K/uL 251 222 230    No new CXR  IMPRESSION: Severe sepsis/septic shock Urinary tract infection S/P stent placement Type 2 diabetes with severe hyperglycemia Chronic atrial fibrillation  PLAN/REC: Transfer to MedSurg floor with cardiac monitoring Continue current antibiotics Discontinue systemic steroids SSI adjusted to ACHS Begin car and modified diet  After transfer, PCCM will sign off. Please call if we can be of further assistance    Merton Border, MD PCCM service Mobile [phone] Pager [phone] 09/17/2017 1:18 PM

## 2017-09-17 NOTE — Progress Notes (Signed)
Pt wore CPAP mask for 3 1/2 hr

## 2017-09-17 NOTE — Progress Notes (Addendum)
Report given to Annabella, pt to transport on 2C bed, portable tank, meds, belongings and chart with her, VSS, pt agreeable to transfer, family in room CCMD notified of new tele box# prior to transport

## 2017-09-17 NOTE — Progress Notes (Signed)
Inpatient Diabetes Program Recommendations  AACE/ADA: New Consensus Statement on Inpatient Glycemic Control (2015)  Target Ranges:  Prepandial:   less than 140 mg/dL      Peak postprandial:   less than 180 mg/dL (1-2 hours)      Critically ill patients:  140 - 180 mg/dL   Lab Results  Component Value Date   GLUCAP 334 (H) 09/17/2017   HGBA1C 7.4 (H) 06/07/2016    Review of Glycemic Control Results for Cassandra [name], Cassandra [name] (MRN [id number]) as of 09/17/2017 09:36  Ref. Range 09/16/2017 13:27 09/16/2017 14:28 09/16/2017 14:34 09/16/2017 14:40 09/16/2017 16:08 09/16/2017 18:51 09/17/2017 05:07 09/17/2017 08:27  Glucose-Capillary Latest Ref Range: 65 - 99 mg/dL 192 (H) 211 (H)      334 (H)   Diabetes history: Type 2 DM Outpatient Diabetes medications: Humalog 12 Units BIDWC, Lantus 50 Units QHS Current orders for Inpatient glycemic control: Lantus 50 Units QHS  Inpatient Diabetes Program Recommendations:    Given steroids, would expect the increases to blood glucose. Would recommend initiating glycemic control order set to include:  A1C-   No recent A1C noted. Last one was on 09/12/16 @9 .0%  Correction-     Novolog 0-9 Units TID                Novolog 0-5 Units QHS Meal coverage- Novolog 3 Units TIDWC when patient consumes >50% of meal.  Thanks, Bronson Curb, MSN, RNC-OB Diabetes Coordinator [phone] (8a-5p)

## 2017-09-17 NOTE — Progress Notes (Signed)
Pharmacy Antibiotic Note  Cassandra [name] is a 84 y.o. female admitted on 09/16/2017 with sepsis secondary to UTI status post ureteral stent placement.  Pharmacy has been consulted for ceftriaxone dosing.  Plan: Will continue ceftriaxone 1 g IV q24h.  Height: 5\' 2"  (157.5 cm) Weight: 167 lb (75.8 kg) IBW/kg (Calculated) : 50.1  Temp (24hrs), Avg:99 F (37.2 C), Min:97.5 F (36.4 C), Max:101.2 F (38.4 C)  Recent Labs  Lab 09/16/17 1608 09/16/17 1851 09/17/17 0507  CREATININE  --   --  0.96  LATICACIDVEN 2.3* 1.5  --     Estimated Creatinine Clearance: 43.1 mL/min (by C-G formula based on SCr of 0.96 mg/dL).    No Known Allergies  Antimicrobials this admission: Ceftriaxone 1/15 >>   Microbiology results: 1/16 BCx: pending, no growth 1/15 MRSA PCR: NEG  Thank you for allowing pharmacy to be a part of this patient's care.  [place] Drelyn Pistilli 09/17/2017 2:14 PM

## 2017-09-17 NOTE — Anesthesia Postprocedure Evaluation (Signed)
Anesthesia Post Note  Patient: Cassandra [name]  Procedure(s) Performed: CYSTOSCOPY/RETROGRADE/URETEROSCOPY/STONE EXTRACTION WITH BASKET (Left Ureter) CYSTOSCOPY WITH STENT REPLACEMENT (Left Ureter)  Patient location during evaluation: ICU Anesthesia Type: General Level of consciousness: awake, awake and alert and oriented Pain management: pain level controlled Vital Signs Assessment: post-procedure vital signs reviewed and stable Respiratory status: spontaneous breathing, respiratory function stable and patient connected to nasal cannula oxygen Cardiovascular status: blood pressure returned to baseline and stable Postop Assessment: no apparent nausea or vomiting and adequate PO intake Anesthetic complications: no Comments: Continues with SOB states I feel better this morning. Pt states was SOB preop but not this bad     Last Vitals:  Vitals:   09/17/17 0600 09/17/17 0630  BP: (!) 109/58 (!) 96/56  Pulse: 67 67  Resp: 19 20  Temp:    SpO2: 91% 94%    Last Pain:  Vitals:   09/17/17 0100  TempSrc: Oral  PainSc: 0-No pain                 Proofreader

## 2017-09-17 NOTE — Progress Notes (Signed)
[place] [facility] [place] NAME: Cassandra [name]    MR#:  [id number]  DATE OF BIRTH:  30-Dec-1932  SUBJECTIVE:  CHIEF COMPLAINT:  No chief complaint on file.  - Complains of shortness of breath. Remains on nasal cannula. Wheezing on exam. -Off pressors now  REVIEW OF SYSTEMS:  Review of Systems  Constitutional: Negative for chills, fever and malaise/fatigue.  HENT: Negative for congestion, ear discharge, hearing loss and nosebleeds.   Eyes: Negative for blurred vision and double vision.  Respiratory: Positive for cough and shortness of breath. Negative for wheezing.   Cardiovascular: Negative for chest pain, palpitations and leg swelling.  Gastrointestinal: Negative for abdominal pain, constipation, diarrhea, nausea and vomiting.  Genitourinary: Negative for dysuria.  Musculoskeletal: Negative for myalgias.  Neurological: Negative for dizziness, speech change, focal weakness, seizures and headaches.  Psychiatric/Behavioral: Negative for depression.    DRUG ALLERGIES:  No Known Allergies  VITALS:  Blood pressure (!) 117/49, pulse 86, temperature (!) 97.5 F (36.4 C), temperature source Axillary, resp. rate 19, height 5\' 2"  (1.575 m), weight 75.8 kg (167 lb), SpO2 90 %.  PHYSICAL EXAMINATION:  Physical Exam  GENERAL:  84 y.o.-year-old patient lying in the bed with no acute distress.  EYES: Pupils equal, round, reactive to light and accommodation. No scleral icterus. Extraocular muscles intact.  HEENT: Head atraumatic, normocephalic. Oropharynx and nasopharynx clear.  NECK:  Supple, no jugular venous distention. No thyroid enlargement, no tenderness.  LUNGS: Few scattered expiratory wheezing noted. No rales or rhonchi or crepitations. No use of accessory muscles for breathing. Slightly tachypneic with minimal exertion.[name]  CARDIOVASCULAR: S1, S2 normal. No murmurs, rubs, or gallops.  ABDOMEN: Soft, nontender, nondistended. Bowel sounds  present. No organomegaly or mass.  EXTREMITIES: No pedal edema, cyanosis, or clubbing.  NEUROLOGIC: Cranial nerves II through XII are intact. Muscle strength 5/5 in all extremities. Sensation intact. Gait not checked.  PSYCHIATRIC: The patient is alert and oriented x 3.  SKIN: No obvious rash, lesion, or ulcer.    LABORATORY PANEL:   CBC No results for input(s): WBC, HGB, HCT, PLT in the last 168 hours. ------------------------------------------------------------------------------------------------------------------  Chemistries  Recent Labs  Lab 09/17/17 0507  NA 140  K 4.1  CL 107  CO2 22  GLUCOSE 368*  BUN 19  CREATININE 0.96  CALCIUM 8.1*  MG 1.8   ------------------------------------------------------------------------------------------------------------------  Cardiac Enzymes No results for input(s): TROPONINI in the last 168 hours. ------------------------------------------------------------------------------------------------------------------  RADIOLOGY:  Dg Chest 1 View  Result Date: 09/16/2017 CLINICAL DATA:  Hypoxemia post cystoscopy, history breast cancer, coronary artery disease post MI, diabetes mellitus, former smoker, hypertension EXAM: CHEST 1 VIEW PORTABLE COMPARISON:  Portable exam 1202 hours compared to 08/17/2017 FINDINGS: LEFT subclavian transvenous pacemaker leads project over RIGHT atrium and RIGHT ventricle. Minimal enlargement of cardiac silhouette. Atherosclerotic calcification aorta. Pulmonary vascularity normal. Chronic bronchitic and interstitial changes question fibrosis. Bibasilar atelectasis. No definite infiltrate, pleural effusion or pneumothorax. IMPRESSION: Chronic interstitial disease/fibrosis with bibasilar atelectasis. Electronically Signed   By: Lavonia Dana M.D.   On: 09/16/2017 12:21    EKG:   Orders placed or performed during the hospital encounter of 08/17/17  . ED EKG  . ED EKG  . EKG 12-Lead  . EKG 12-Lead    ASSESSMENT  AND PLAN:   Cassandra [name]  is a 84 y.o. female with a known history of COPD/sleep apnea on CPAP at home, chronic pulmonary fibrosis, CAD, history of kidney stones was admitted for elective  left ureteral distal calculus removal and stent placement. However went to ICU for postoperative hypoxia and hypotension   1.  Acute hypoxic respiratory failure in the setting of COPD/chronic pulmonary fibrosis -Patient currently is postop cystoscopy with left distal ureteral stone removal and stent placement by Dr. Bernardo Heater -Off BiPAP. Appreciate pulmonary consult -Still has some scattered wheezing. Continue duo nebs. Started on oral steroids.  2.   sepsis-likely UTI in the setting of new stent placement. -Blood pressure is improving. Off pressors -IV fluids as needed. -Hold blood pressure medications. On Rocephin.  3.  history of chronic atrial flutter on oral anticoagulation. -Rate controlled. She has a pacemaker for sick sinus syndrome. -On eliquis for anticoagulation  4.  Diabetes -Sliding scale insulin -Restarted Lantus 50 units at bedtime and pre-meal insulin  5.  DVT prophylaxis on Eliquis   Physical therapy consult tomorrow    All the records are reviewed and case discussed with Care Management/Social Workerr. Management plans discussed with the patient, family and they are in agreement.  CODE STATUS: Full code  TOTAL TIME TAKING CARE OF THIS PATIENT: 39 minutes.   POSSIBLE D/C IN 2 DAYS, DEPENDING ON CLINICAL CONDITION.   Gladstone Lighter M.D on 09/17/2017 at 1:41 PM  Between 7am to 6pm - Pager - [phone]  After 6pm go to [email or web address] - password [email or web address] [facility]  Office  [phone]  CC: Primary care physician; Idelle Crouch, MD

## 2017-09-18 ENCOUNTER — Inpatient Hospital Stay: Payer: Medicare Other

## 2017-09-18 LAB — GLUCOSE, CAPILLARY
Glucose-Capillary: 283 mg/dL — ABNORMAL HIGH (ref 65–99)
Glucose-Capillary: 375 mg/dL — ABNORMAL HIGH (ref 65–99)
Glucose-Capillary: 475 mg/dL — ABNORMAL HIGH (ref 65–99)
Glucose-Capillary: 360 mg/dL — ABNORMAL HIGH (ref 65–99)

## 2017-09-18 LAB — BASIC METABOLIC PANEL
Anion gap: 10 (ref 5–15)
BUN: 23 mg/dL — AB (ref 6–20)
CO2: 21 mmol/L — ABNORMAL LOW (ref 22–32)
CREATININE: 0.82 mg/dL (ref 0.44–1.00)
Calcium: 8.3 mg/dL — ABNORMAL LOW (ref 8.9–10.3)
Chloride: 103 mmol/L (ref 101–111)
GFR calc Af Amer: >60 mL/min (ref >60)
GLUCOSE: 304 mg/dL — AB (ref 65–99)
POTASSIUM: 5 mmol/L (ref 3.5–5.1)
SODIUM: 134 mmol/L — AB (ref 135–145)

## 2017-09-18 LAB — CBC
HCT: 30 % — ABNORMAL LOW (ref 35.0–47.0)
Hemoglobin: 9.2 g/dL — ABNORMAL LOW (ref 12.0–16.0)
MCH: 24.3 pg — ABNORMAL LOW (ref 26.0–34.0)
MCHC: 30.5 g/dL — AB (ref 32.0–36.0)
MCV: 79.8 fL — AB (ref 80.0–100.0)
PLATELETS: 177 10*3/uL (ref 150–440)
RBC: 3.76 MIL/uL — ABNORMAL LOW (ref 3.80–5.20)
RDW: 17.4 % — AB (ref 11.5–14.5)
WBC: 21.1 10*3/uL — ABNORMAL HIGH (ref 3.6–11.0)

## 2017-09-18 MED ORDER — METHYLPREDNISOLONE SODIUM SUCC 125 MG IJ SOLR
60.0000 mg | Freq: Two times a day (BID) | INTRAMUSCULAR | Status: DC
  Administered 2017-09-18 – 2017-09-20 (×4): 60 mg via INTRAVENOUS
  Filled 2017-09-18 (×4): qty 2

## 2017-09-18 MED ORDER — CEPHALEXIN 500 MG PO CAPS
500.0000 mg | ORAL_CAPSULE | Freq: Two times a day (BID) | ORAL | Status: DC
  Administered 2017-09-18 – 2017-09-20 (×5): 500 mg via ORAL
  Filled 2017-09-18 (×5): qty 1

## 2017-09-18 MED ORDER — MOMETASONE FURO-FORMOTEROL FUM 200-5 MCG/ACT IN AERO
2.0000 | INHALATION_SPRAY | Freq: Two times a day (BID) | RESPIRATORY_TRACT | Status: DC
  Administered 2017-09-18 – 2017-09-23 (×11): 2 via RESPIRATORY_TRACT
  Filled 2017-09-18: qty 8.8

## 2017-09-18 MED ORDER — CEPHALEXIN 250 MG PO CAPS
250.0000 mg | ORAL_CAPSULE | Freq: Three times a day (TID) | ORAL | Status: DC
  Filled 2017-09-18 (×2): qty 1

## 2017-09-18 MED ORDER — ALBUTEROL SULFATE (2.5 MG/3ML) 0.083% IN NEBU
2.5000 mg | INHALATION_SOLUTION | Freq: Four times a day (QID) | RESPIRATORY_TRACT | Status: DC
  Administered 2017-09-18 – 2017-09-23 (×16): 2.5 mg via RESPIRATORY_TRACT
  Filled 2017-09-18 (×19): qty 3

## 2017-09-18 MED ORDER — TIOTROPIUM BROMIDE MONOHYDRATE 18 MCG IN CAPS
18.0000 ug | ORAL_CAPSULE | Freq: Every morning | RESPIRATORY_TRACT | Status: DC
  Administered 2017-09-18 – 2017-09-23 (×6): 18 ug via RESPIRATORY_TRACT
  Filled 2017-09-18 (×2): qty 5

## 2017-09-18 MED ORDER — INSULIN ASPART 100 UNIT/ML ~~LOC~~ SOLN
8.0000 [IU] | Freq: Three times a day (TID) | SUBCUTANEOUS | Status: DC
  Administered 2017-09-18 – 2017-09-22 (×10): 8 [IU] via SUBCUTANEOUS
  Filled 2017-09-18 (×11): qty 1

## 2017-09-18 MED ORDER — MELATONIN 5 MG PO TABS
5.0000 mg | ORAL_TABLET | Freq: Every day | ORAL | Status: DC
  Administered 2017-09-18 – 2017-09-21 (×4): 5 mg via ORAL
  Filled 2017-09-18 (×5): qty 1

## 2017-09-18 NOTE — Consult Note (Signed)
Urology Ms. Cassandra [name] was admitted to ICU post basket extraction of a distal ureteral calculus.  She presented with hypoxia, hypotension and a sepsis-like picture.  She spiked a temp to 102 in the PACU.  Her preoperative urine culture was negative and she was covered with antibiotics preprocedure that were sensitive to her initial positive urine culture.  Recommendation: I had initially recommended stent removal on 1/18 however with her clinical picture would recommend discharging home on oral antibiotics and follow-up early next week for stent removal

## 2017-09-18 NOTE — Progress Notes (Signed)
Cassandra [name] NAME: Cassandra [name]    MR#:  [id number]  DATE OF BIRTH:  July 28, 1933  SUBJECTIVE:  CHIEF COMPLAINT:  No chief complaint on file.  -Breathing about the same. Still needing 3-4 L of oxygen which is acute. -Complains of insomnia  REVIEW OF SYSTEMS:  Review of Systems  Constitutional: Positive for malaise/fatigue. Negative for chills and fever.  HENT: Negative for congestion, ear discharge, hearing loss and nosebleeds.   Eyes: Negative for blurred vision and double vision.  Respiratory: Positive for shortness of breath. Negative for cough and wheezing.   Cardiovascular: Negative for chest pain, palpitations and leg swelling.  Gastrointestinal: Negative for abdominal pain, constipation, diarrhea, nausea and vomiting.  Genitourinary: Positive for hematuria. Negative for dysuria.  Musculoskeletal: Negative for myalgias.  Neurological: Negative for dizziness, speech change, focal weakness, seizures and headaches.  Psychiatric/Behavioral: Negative for depression. The patient has insomnia.     DRUG ALLERGIES:  No Known Allergies  VITALS:  Blood pressure (!) 151/84, pulse 71, temperature 97.9 F (36.6 C), resp. rate 20, height 5\' 2"  (1.575 m), weight 75.8 kg (167 lb), SpO2 93 %.  PHYSICAL EXAMINATION:  Physical Exam  GENERAL:  84 y.o.-year-old patient lying in the bed with no acute distress. Appears tachypneic at times EYES: Pupils equal, round, reactive to light and accommodation. No scleral icterus. Extraocular muscles intact.  HEENT: Head atraumatic, normocephalic. Oropharynx and nasopharynx clear.  NECK:  Supple, no jugular venous distention. No thyroid enlargement, no tenderness.  LUNGS: Few scattered expiratory wheezing noted. No rales or rhonchi or crepitations. No use of accessory muscles for breathing. Slightly tachypneic with minimal exertion.[name]  CARDIOVASCULAR: S1, S2 normal. No murmurs, rubs, or gallops.    ABDOMEN: Soft, nontender, nondistended. Bowel sounds present. No organomegaly or mass.  EXTREMITIES: No pedal edema, cyanosis, or clubbing.  NEUROLOGIC: Cranial nerves II through XII are intact. Muscle strength 5/5 in all extremities. Sensation intact. Gait not checked.  PSYCHIATRIC: The patient is alert and oriented x 3.  SKIN: No obvious rash, lesion, or ulcer.    LABORATORY PANEL:   CBC Recent Labs  Lab 09/18/17 0545  WBC 21.1*  HGB 9.2*  HCT 30.0*  PLT 177   ------------------------------------------------------------------------------------------------------------------  Chemistries  Recent Labs  Lab 09/17/17 0507 09/18/17 0545  NA 140 134*  K 4.1 5.0  CL 107 103  CO2 22 21*  GLUCOSE 368* 304*  BUN 19 23*  CREATININE 0.96 0.82  CALCIUM 8.1* 8.3*  MG 1.8  --    ------------------------------------------------------------------------------------------------------------------  Cardiac Enzymes No results for input(s): TROPONINI in the last 168 hours. ------------------------------------------------------------------------------------------------------------------  RADIOLOGY:  Dg Chest 1 View  Result Date: 09/16/2017 CLINICAL DATA:  Hypoxemia post cystoscopy, history breast cancer, coronary artery disease post MI, diabetes mellitus, former smoker, hypertension EXAM: CHEST 1 VIEW PORTABLE COMPARISON:  Portable exam 1202 hours compared to 08/17/2017 FINDINGS: LEFT subclavian transvenous pacemaker leads project over RIGHT atrium and RIGHT ventricle. Minimal enlargement of cardiac silhouette. Atherosclerotic calcification aorta. Pulmonary vascularity normal. Chronic bronchitic and interstitial changes question fibrosis. Bibasilar atelectasis. No definite infiltrate, pleural effusion or pneumothorax. IMPRESSION: Chronic interstitial disease/fibrosis with bibasilar atelectasis. Electronically Signed   By: Lavonia Dana M.D.   On: 09/16/2017 12:21    EKG:   Orders placed or  performed during the hospital encounter of 08/17/17  . ED EKG  . ED EKG  . EKG 12-Lead  . EKG 12-Lead    ASSESSMENT AND PLAN:   Cassandra  [name]  is a 84 y.o. female with a known history of COPD/sleep apnea on CPAP at home, chronic pulmonary fibrosis, CAD, history of kidney stones was admitted for elective left ureteral distal calculus removal and stent placement. However went to ICU for postoperative hypoxia and hypotension   1.  Acute hypoxic respiratory failure in the setting of COPD/chronic pulmonary fibrosis -Patient currently is postop cystoscopy with left distal ureteral stone removal and stent placement by Dr. Bernardo [name] -Off BiPAP. Appreciate pulmonary consult -Still has scattered wheezing. Continue duo nebs.  -Started IV steroids, inhalers and monitor. -Not on home oxygen at this time and requiring 3-4 L O2. Incentive spirometry  2.   sepsis-likely UTI in the setting of new stent placement. -Tent is supposed to be removed tomorrow. Consult urology prior to that -Blood pressure is improving. Off pressors -Hold blood pressure medications. On Rocephin. Change to oral antibiotics today  3.  history of chronic atrial flutter on oral anticoagulation. -Rate controlled. She has a pacemaker for sick sinus syndrome. -On eliquis for anticoagulation  4.  Diabetes -Sliding scale insulin -Restarted Lantus 50 units at bedtime and pre-meal insulin  5.  DVT prophylaxis on Eliquis Monitor with her hematuria   Physical therapy consult    All the records are reviewed and case discussed with Care Management/Social Workerr. Management plans discussed with the patient, family and they are in agreement.  CODE STATUS: Full code  TOTAL TIME TAKING CARE OF THIS PATIENT: 37 minutes.   POSSIBLE D/C IN 1-2 DAYS, DEPENDING ON CLINICAL CONDITION.   Cassandra [name] M.D on 09/18/2017 at 12:04 PM  Between 7am to 6pm - Pager - [phone]  After 6pm go to [email or web address] - password [email or web address]  [facility]  Office  [phone]  CC: Primary care physician; Cassandra Crouch, MD

## 2017-09-18 NOTE — Progress Notes (Signed)
PT Cancellation Note  Patient Details Name: Cassandra [name] MRN: [id number] DOB: Aug 23, 1933   Cancelled Treatment:    Reason Eval/Treat Not Completed: (Consult received and chart reviewed.  Family member at bedside reports patient did not rest well last night and "just fell asleep".  Requests therapist not wake patient at this time an re-attempt at later time/date as able.  Will hold and continue effort at later time/date as medically appropriate.)  Damyn Weitzel H. Owens Shark, PT, DPT, NCS 09/18/17, 10:25 AM [phone]

## 2017-09-18 NOTE — Progress Notes (Deleted)
Inpatient Diabetes Program Recommendations  AACE/ADA: New Consensus Statement on Inpatient Glycemic Control (2015)  Target Ranges:  Prepandial:   less than 140 mg/dL      Peak postprandial:   less than 180 mg/dL (1-2 hours)      Critically ill patients:  140 - 180 mg/dL   Lab Results  Component Value Date   GLUCAP 375 (H) 09/18/2017   HGBA1C 7.4 (H) 06/07/2016    Review of Glycemic Control  Results for [name], [name] (MRN [id number]) as of 09/18/2017 12:39  Ref. Range 09/17/2017 11:44 09/17/2017 16:44 09/17/2017 21:29 09/18/2017 07:41 09/18/2017 12:14  Glucose-Capillary Latest Ref Range: 65 - 99 mg/dL 387 (H) 207 (H) 259 (H) 283 (H) 375 (H)   Diabetes history: Type 2 DM  Outpatient Diabetes medications: Humalog 12 Units BIDWC, Lantus 50 Units QHS  Current orders for Inpatient glycemic control: Lantus 50 Units QHS, Novolog 0-15 units tid, Novolog 0-5 units qhs, Novolog 4 units tid  Inpatient Diabetes Program Recommendations:    Per ADA recommendations "consider performing an A1C on all patients with diabetes or hyperglycemia admitted to the hospital if not performed in the prior 3 months".  Consider increasing Novolog to 6 units tid  Gentry Fitz, RN, [place], , CDE Diabetes Coordinator Inpatient Diabetes Program  [phone] (Team Pager) [phone] ([place]) 09/18/2017 12:45 PM

## 2017-09-18 NOTE — Progress Notes (Signed)
Inpatient Diabetes Program Recommendations  AACE/ADA: New Consensus Statement on Inpatient Glycemic Control (2015)  Target Ranges:  Prepandial:   less than 140 mg/dL      Peak postprandial:   less than 180 mg/dL (1-2 hours)      Critically ill patients:  140 - 180 mg/dL   Lab Results  Component Value Date   GLUCAP 375 (H) 09/18/2017   HGBA1C 7.4 (H) 06/07/2016    Review of Glycemic Control  Diabetes history: Type 2 DM Outpatient Diabetes medications: Humalog 12 Units BIDWC, Lantus 50 Units QHS Current orders for Inpatient glycemic control: Lantus 50 Units QHS, Novolog 0-5 units QHS, Novolog 0-15 Units TIDAC, Novolog 4 units TID   Inpatient Diabetes Program Recommendations:    According to [facility], appears patient missed Novolog 0-5 QHS correction and Novolog 4 Units for supper. In the notes, it states that patient and family refused.  Spoke with patient regarding Novolog and feelings towards insulin administration. Patient states, "I am okay with whatever recommendations you feel is necessary." Doesn't remember anyone offering insulin yesterday evening.  Patient admits to eating a milkshake today and other unfit food options that she anticipates increases to blood sugar. We discussed the importance of good glycemic control. Patient reports checking blood sugars 3-4x a day and that blood sugars are usually less than 180 mg/dL. Verified her medication regimen to what is listed.   After speaking with the patient, would recommend increasing Novolog to 8 Units [facility]. Will continue to follow.  Thanks, Bronson Curb, MSN, RNC-OB Diabetes Coordinator [phone] (8a-5p)

## 2017-09-19 ENCOUNTER — Ambulatory Visit: Payer: Medicare Other

## 2017-09-19 LAB — CBC
HCT: 29.1 % — ABNORMAL LOW (ref 35.0–47.0)
HEMOGLOBIN: 9.2 g/dL — AB (ref 12.0–16.0)
MCH: 24.2 pg — AB (ref 26.0–34.0)
MCHC: 31.6 g/dL — AB (ref 32.0–36.0)
MCV: 76.6 fL — ABNORMAL LOW (ref 80.0–100.0)
Platelets: 237 10*3/uL (ref 150–440)
RBC: 3.8 MIL/uL (ref 3.80–5.20)
RDW: 16.5 % — ABNORMAL HIGH (ref 11.5–14.5)
WBC: 19.3 10*3/uL — ABNORMAL HIGH (ref 3.6–11.0)

## 2017-09-19 LAB — STONE ANALYSIS
Ca Oxalate,Monohydr.: 95 %
Ca phos cry stone ql IR: 5 %
STONE WEIGHT KSTONE: 40.9 mg

## 2017-09-19 LAB — BASIC METABOLIC PANEL
ANION GAP: 11 (ref 5–15)
BUN: 27 mg/dL — AB (ref 6–20)
CALCIUM: 8.5 mg/dL — AB (ref 8.9–10.3)
CO2: 21 mmol/L — ABNORMAL LOW (ref 22–32)
Chloride: 104 mmol/L (ref 101–111)
Creatinine, Ser: 0.89 mg/dL (ref 0.44–1.00)
GFR calc Af Amer: >60 mL/min (ref >60)
GFR, EST NON AFRICAN AMERICAN: 59 mL/min — AB (ref >60)
GLUCOSE: 328 mg/dL — AB (ref 65–99)
Potassium: 4.5 mmol/L (ref 3.5–5.1)
Sodium: 136 mmol/L (ref 135–145)

## 2017-09-19 LAB — GLUCOSE, CAPILLARY
GLUCOSE-CAPILLARY: 501 mg/dL — AB (ref 65–99)
GLUCOSE-CAPILLARY: 335 mg/dL — AB (ref 65–99)
GLUCOSE-CAPILLARY: 439 mg/dL — AB (ref 65–99)
Glucose-Capillary: 508 mg/dL (ref 65–99)
Glucose-Capillary: 319 mg/dL — ABNORMAL HIGH (ref 65–99)

## 2017-09-19 MED ORDER — LORAZEPAM 1 MG PO TABS: 1.0000 mg | ORAL_TABLET | Freq: Every evening | ORAL | Status: DC | PRN

## 2017-09-19 MED ORDER — FUROSEMIDE 10 MG/ML IJ SOLN
40.0000 mg | Freq: Once | INTRAMUSCULAR | Status: AC
  Administered 2017-09-19: 40 mg via INTRAVENOUS
  Filled 2017-09-19: qty 4

## 2017-09-19 MED ORDER — INSULIN ASPART 100 UNIT/ML ~~LOC~~ SOLN
20.0000 [IU] | Freq: Once | SUBCUTANEOUS | Status: AC
  Administered 2017-09-19: 20 [IU] via SUBCUTANEOUS
  Filled 2017-09-19: qty 1

## 2017-09-19 MED ORDER — INSULIN GLARGINE 100 UNIT/ML ~~LOC~~ SOLN
56.0000 [IU] | Freq: Every day | SUBCUTANEOUS | Status: DC
  Filled 2017-09-19: qty 0.56

## 2017-09-19 MED ORDER — INSULIN GLARGINE 100 UNIT/ML ~~LOC~~ SOLN
60.0000 [IU] | Freq: Every day | SUBCUTANEOUS | Status: DC
  Administered 2017-09-19 – 2017-09-20 (×2): 60 [IU] via SUBCUTANEOUS
  Filled 2017-09-19 (×3): qty 0.6

## 2017-09-19 MED ORDER — FUROSEMIDE 40 MG PO TABS
40.0000 mg | ORAL_TABLET | Freq: Every day | ORAL | Status: DC
  Administered 2017-09-19 – 2017-09-23 (×5): 40 mg via ORAL
  Filled 2017-09-19 (×5): qty 1

## 2017-09-19 MED ORDER — METFORMIN HCL 500 MG PO TABS
1000.0000 mg | ORAL_TABLET | Freq: Two times a day (BID) | ORAL | Status: DC
  Administered 2017-09-19 – 2017-09-23 (×7): 1000 mg via ORAL
  Filled 2017-09-19 (×7): qty 2

## 2017-09-19 NOTE — Progress Notes (Signed)
MD notified of elevated B.S. Orders given for one time dose of 20 units of Novolog and to change Lantus to 60 units tonight at bedtime.

## 2017-09-19 NOTE — Progress Notes (Signed)
MD notified of high glucose. Orders given for 20 units of insulin.

## 2017-09-19 NOTE — Progress Notes (Addendum)
Inpatient Diabetes Program Recommendations  AACE/ADA: New Consensus Statement on Inpatient Glycemic Control (2015)  Target Ranges:  Prepandial:   less than 140 mg/dL      Peak postprandial:   less than 180 mg/dL (1-2 hours)      Critically ill patients:  140 - 180 mg/dL   Lab Results  Component Value Date   GLUCAP 335 (H) 09/19/2017   HGBA1C 7.4 (H) 06/07/2016    Review of Glycemic Control  Results for Cassandra [name], Cassandra [name] (MRN [id number]) as of 09/19/2017 09:04  Ref. Range 09/18/2017 12:14 09/18/2017 17:04 09/18/2017 17:08 09/18/2017 21:38 09/19/2017 07:33  Glucose-Capillary Latest Ref Range: 65 - 99 mg/dL 375 (H) 501 (HH) 475 (H) 360 (H) 335 (H)    Diabetes history:Type 2 DM Outpatient Diabetes medications:Humalog 12 Units BIDWC, Lantus 50 Units QHS  Current orders for Inpatient glycemic control:Lantus 50 Units QHS, Novolog 0-5 units QHS, Novolog 0-15 Units TIDAC, Novolog 8 units TID  Steroids continue to drive blood sugars very high - Novolog increased to 8 units with meals yesterday and correction Novolog continues. If the steroids are going to continue- could increase Lantus to 56 units qhs and consider increasing Novolog correction to resistance scale 0-20 tid.    If the patient is going to be discharged home- she will need her Humalog tid and a sliding scale which will allow her to taper her insulin as the steroids are decreased.  Gentry Fitz, RN, BA, MHA, CDE Diabetes Coordinator Inpatient Diabetes Program  [phone] (Team Pager) [phone] ([place]) 09/19/2017 9:09 AM

## 2017-09-19 NOTE — Evaluation (Signed)
Physical Therapy Evaluation Patient Details Name: Cassandra [name] MRN: [id number] DOB: 22-Sep-1932 Today's Date: 09/19/2017   History of Present Illness  presented to hospital for elective removal of L ureteral distal calculus with stent placement. Post-op course complicated by hypoxia and hypotension, necessitating acute hospitalization secondary to sepsis related to UTI, acute hypoxic respiratory failure due to COPD/fibrosis.  Clinical Impression  Upon evaluation, patient alert and oriented; follows commands and demonstrates good effort with all functional activities.  Bilat UE/LE strength and ROM grossly symmetrical and WFL for basic transfers and mobility; denies pain, reporting only soreness/stiffness in bilat LEs.  Currently requiring min assist for sit/stand, basic transfers and short-distance gait (16') with RW; broad BOS with increased tendency towards posterior LOB (min assist from therapist for correction).  Desat to 85-86% on 5L with minimal distance, requiring seated rest and 60-90 seconds of pursed lip breathing for recovery  Additional distance deferred as result.  Will continue to assess/progress as medically appropriate. Would benefit from skilled PT to address above deficits and promote optimal return to PLOF; Recommend transition to [place] upon discharge from acute hospitalization.     Follow Up Recommendations Home health PT;Supervision/Assistance - 24 hour    Equipment Recommendations  Rolling walker with 5" wheels    Recommendations for Other Services       Precautions / Restrictions Precautions Precautions: Fall Restrictions Weight Bearing Restrictions: No      Mobility  Bed Mobility Overal bed mobility: Modified Independent                Transfers Overall transfer level: Needs assistance Equipment used: Rolling walker (2 wheeled) Transfers: Sit to/from Stand Sit to Stand: Min assist         General transfer comment: prefers to pull up on RW  despite cuing  Ambulation/Gait Ambulation/Gait assistance: Min assist Ambulation Distance (Feet): 15 Feet Assistive device: Rolling walker (2 wheeled)       General Gait Details: broad BOS, forward flexed posture; decreased heel strike/toe off bilat, decreased dynamic balance reactions.  Desat to 85-86% on 5L with minimal distance, requiring seated rest and 60-90 seconds of pursed lip breathing for recovery  Additional distance deferred as result.  Stairs            Wheelchair Mobility    Modified Rankin (Stroke Patients Only)       Balance Overall balance assessment: Needs assistance Sitting-balance support: No upper extremity supported;Feet supported Sitting balance-Leahy Scale: Good     Standing balance support: Bilateral upper extremity supported Standing balance-Leahy Scale: Fair                               Pertinent Vitals/Pain Pain Assessment: Faces Faces Pain Scale: Hurts little more Pain Location: bilat LEs Pain Descriptors / Indicators: Sore Pain Intervention(s): Limited activity within patient's tolerance;Monitored during session;Repositioned    Home Living Family/patient expects to be discharged to:: Private residence Living Arrangements: Spouse/significant other Available Help at Discharge: Family Type of Home: (condo) Home Access: Level entry              Prior Function Level of Independence: Independent         Comments: Indep with ADLs, household mobility; intermittent use of 4WRW with community distances.  Denies fall history; no home O2.     Hand Dominance        Extremity/Trunk Assessment   Upper Extremity Assessment Upper Extremity Assessment: Overall WFL for tasks  assessed    Lower Extremity Assessment Lower Extremity Assessment: Overall WFL for tasks assessed(grossly 4-/5 throughout)       Communication   Communication: No difficulties  Cognition Arousal/Alertness: Awake/alert Behavior During  Therapy: WFL for tasks assessed/performed Overall Cognitive Status: Within Functional Limits for tasks assessed                                        General Comments      Exercises Other Exercises Other Exercises: Toilet transfer, SPT with RW, min assist for dynamic balance and overall safety.  Slightly impuslive, frequent assist to prevent posteiror LOB Other Exercises: Rolling, briding in bed for peri-care and clothing management (prior to initial OOB activity), sup/mod indep.  Fair/good LE stength; able to fully clear buttocks with bridging.   Assessment/Plan    PT Assessment Patient needs continued PT services  PT Problem List Decreased strength;Decreased activity tolerance;Decreased balance;Decreased mobility;Decreased knowledge of use of DME;Decreased safety awareness;Decreased knowledge of precautions;Cardiopulmonary status limiting activity       PT Treatment Interventions DME instruction;Gait training;Functional mobility training;Therapeutic activities;Therapeutic exercise;Balance training;Cognitive remediation;Patient/family education    PT Goals (Current goals can be found in the Care Plan section)  Acute Rehab PT Goals Patient Stated Goal: to get moving again PT Goal Formulation: With patient Time For Goal Achievement: 10/03/17 Potential to Achieve Goals: Good Additional Goals Additional Goal #1: Indep with activity pacing and energy conservation techniques to maximize oxygenation with activity.    Frequency Min 2X/week   Barriers to discharge        Co-evaluation               AM-PAC PT "6 Clicks" Daily Activity  Outcome Measure Difficulty turning over in bed (including adjusting bedclothes, sheets and blankets)?: A Little Difficulty moving from lying on back to sitting on the side of the bed? : A Little Difficulty sitting down on and standing up from a chair with arms (e.g., wheelchair, bedside commode, etc,.)?: Unable Help needed  moving to and from a bed to chair (including a wheelchair)?: A Little Help needed walking in hospital room?: A Little Help needed climbing 3-5 steps with a railing? : A Lot 6 Click Score: 15    End of Session Equipment Utilized During Treatment: Gait belt;Oxygen Activity Tolerance: Patient tolerated treatment well Patient left: in chair;with call bell/phone within reach;with chair alarm set;with family/visitor present Nurse Communication: Mobility status PT Visit Diagnosis: Unsteadiness on feet (R26.81);Muscle weakness (generalized) (M62.81);Difficulty in walking, not elsewhere classified (R26.2)    Time: 5188-4166 PT Time Calculation (min) (ACUTE ONLY): 45 min   Charges:   PT Evaluation $PT Eval Moderate Complexity: 1 Mod PT Treatments $Therapeutic Activity: 23-37 mins   PT G Codes:        Arlyce Circle H. Owens Shark, PT, DPT, NCS 09/19/17, 2:10 PM [phone]

## 2017-09-19 NOTE — Care Management Important Message (Signed)
Important Message  Patient Details  Name: Cassandra [name] MRN: [id number] Date of Birth: 12/07/1932   Medicare Important Message Given:  Yes    Beverly Sessions, RN 09/19/2017, 4:40 PM

## 2017-09-19 NOTE — Progress Notes (Addendum)
[name] at [place] NAME: Cassandra [name]    MR#:  [id number]  DATE OF BIRTH:  [birth year]  SUBJECTIVE:  CHIEF COMPLAINT:  No chief complaint on file.  -Continues to be dyspneic, still requiring up to 4-5 L of oxygen which is acute. -Slept better last night.  Has mild hematuria  REVIEW OF SYSTEMS:  Review of Systems  Constitutional: Positive for malaise/fatigue. Negative for chills and fever.  HENT: Negative for congestion, ear discharge, hearing loss and nosebleeds.   Eyes: Negative for blurred vision and double vision.  Respiratory: Positive for shortness of breath. Negative for cough and wheezing.   Cardiovascular: Negative for chest pain, palpitations and leg swelling.  Gastrointestinal: Negative for abdominal pain, constipation, diarrhea, nausea and vomiting.  Genitourinary: Positive for hematuria. Negative for dysuria.  Musculoskeletal: Negative for myalgias.  Neurological: Negative for dizziness, speech change, focal weakness, seizures and headaches.  Psychiatric/Behavioral: Negative for depression. The patient has insomnia.     DRUG ALLERGIES:  No Known Allergies  VITALS:  Blood pressure (!) 149/60, pulse 67, temperature (!) 97.4 F (36.3 C), temperature source Oral, resp. rate 20, height 5\' 2"  (1.575 m), weight 75.8 kg (167 lb), SpO2 92 %.  PHYSICAL EXAMINATION:  Physical Exam  GENERAL:  84 y.o.-year-old patient lying in the bed with no acute distress. Appears tachypneic at times EYES: Pupils equal, round, reactive to light and accommodation. No scleral icterus. Extraocular muscles intact.  HEENT: Head atraumatic, normocephalic. Oropharynx and nasopharynx clear.  NECK:  Supple, no jugular venous distention. No thyroid enlargement, no tenderness.  LUNGS: Few scattered expiratory wheezing noted-wheezing is more upper airway.  Right basilar crackles heard.. No rhonchi or crepitations. No use of accessory muscles for  breathing. Slightly tachypneic with minimal exertion.[name]  CARDIOVASCULAR: S1, S2 normal. No murmurs, rubs, or gallops.  ABDOMEN: Soft, nontender, nondistended. Bowel sounds present. No organomegaly or mass.  EXTREMITIES: No pedal edema, cyanosis, or clubbing.  NEUROLOGIC: Cranial nerves II through XII are intact. Muscle strength 5/5 in all extremities. Sensation intact. Gait not checked.  PSYCHIATRIC: The patient is alert and oriented x 3.  SKIN: No obvious rash, lesion, or ulcer.    LABORATORY PANEL:   CBC Recent Labs  Lab 09/19/17 0516  WBC 19.3*  HGB 9.2*  HCT 29.1*  PLT 237   ------------------------------------------------------------------------------------------------------------------  Chemistries  Recent Labs  Lab 09/17/17 0507  09/19/17 0516  NA 140   < > 136  K 4.1   < > 4.5  CL 107   < > 104  CO2 22   < > 21*  GLUCOSE 368*   < > 328*  BUN 19   < > 27*  CREATININE 0.96   < > 0.89  CALCIUM 8.1*   < > 8.5*  MG 1.8  --   --    < > = values in this interval not displayed.   ------------------------------------------------------------------------------------------------------------------  Cardiac Enzymes No results for input(s): TROPONINI in the last 168 hours. ------------------------------------------------------------------------------------------------------------------  RADIOLOGY:  Dg Chest 2 View  Result Date: 09/18/2017 CLINICAL DATA:  Hypoxia. EXAM: CHEST  2 VIEW COMPARISON:  Radiograph of September 16, 2017. FINDINGS: The heart size and mediastinal contours are within normal limits. Atherosclerosis of thoracic aorta is noted. Left-sided pacemaker is unchanged in position. Stable diffuse interstitial densities are noted throughout both lungs consistent with pulmonary fibrosis. No acute pulmonary abnormality is noted. No pneumothorax is noted. Small bilateral pleural effusions are noted. The visualized skeletal structures  are unremarkable. IMPRESSION: Stable  diffuse interstitial densities are noted throughout both lungs consistent with pulmonary fibrosis. Small bilateral pleural effusions are noted. Electronically Signed   By: Marijo Conception, M.D.   On: 09/18/2017 16:29    EKG:   Orders placed or performed during the hospital encounter of 08/17/17  . ED EKG  . ED EKG  . EKG 12-Lead  . EKG 12-Lead    ASSESSMENT AND PLAN:   Cassandra [name]  is a 84 y.o. female with a known history of COPD/sleep apnea on CPAP at home, chronic pulmonary fibrosis, CAD, history of kidney stones was admitted for elective left ureteral distal calculus removal and stent placement. However went to ICU for postoperative hypoxia and hypotension   1.  Acute hypoxic respiratory failure in the setting of COPD/chronic pulmonary fibrosis -Patient currently is postop cystoscopy with left distal ureteral stone removal and stent placement by Dr. Bernardo Heater -Off BiPAP. Appreciate pulmonary consult -Still has scattered wheezing. Continue duo nebs.  -on IV steroids, inhalers and monitor. -Not on home oxygen at this time and requiring 4-5 L O2. Incentive spirometry -Restart her oral dose of home Lasix and 1 extra dose of IV Lasix today.  2.   sepsis-likely UTI in the setting of new stent placement. -Appreciate urology consult.  Plans to remove the stent as outpatient after discharge next week -Blood pressure is improving. Off pressors -Hold blood pressure medications.  -Was using Rocephin in the hospital.  Urine cultures are negative, likely because she received preop antibiotics.  Change to oral Keflex at this time - leukocytosis from steroids  3.  history of chronic atrial flutter on oral anticoagulation. -Rate controlled. She has a pacemaker for sick sinus syndrome. -On eliquis for anticoagulation  4.  Diabetes -Sliding scale insulin -Increase Lantus to 56 units at bedtime and pre-meal insulin -Sugars are elevated from steroids and also patient getting milkshakes  from home  5.  DVT prophylaxis on Eliquis Monitor with her hematuria   Physical therapy consulted    All the records are reviewed and case discussed with Care Management/Social Workerr. Management plans discussed with the patient, family and they are in agreement.  CODE STATUS: Full code  TOTAL TIME TAKING CARE OF THIS PATIENT: 38 minutes.   POSSIBLE D/C IN 1-2 DAYS, DEPENDING ON CLINICAL CONDITION.   Gladstone Lighter M.D on 09/19/2017 at 9:18 AM  Between 7am to 6pm - Pager - [phone]  After 6pm go to [email or web address] - password [email or web address] [facility]  Office  [phone]  CC: Primary care physician; Idelle Crouch, MD

## 2017-09-20 LAB — GLUCOSE, CAPILLARY
Glucose-Capillary: 299 mg/dL — ABNORMAL HIGH (ref 65–99)
Glucose-Capillary: 340 mg/dL — ABNORMAL HIGH (ref 65–99)
Glucose-Capillary: 150 mg/dL — ABNORMAL HIGH (ref 65–99)
Glucose-Capillary: 167 mg/dL — ABNORMAL HIGH (ref 65–99)

## 2017-09-20 LAB — BASIC METABOLIC PANEL
ANION GAP: 11 (ref 5–15)
BUN: 33 mg/dL — AB (ref 6–20)
CALCIUM: 8.7 mg/dL — AB (ref 8.9–10.3)
CO2: 28 mmol/L (ref 22–32)
Chloride: 98 mmol/L — ABNORMAL LOW (ref 101–111)
Creatinine, Ser: 0.88 mg/dL (ref 0.44–1.00)
GFR calc Af Amer: >60 mL/min (ref >60)
GFR, EST NON AFRICAN AMERICAN: 60 mL/min — AB (ref >60)
GLUCOSE: 276 mg/dL — AB (ref 65–99)
Potassium: 4.4 mmol/L (ref 3.5–5.1)
SODIUM: 137 mmol/L (ref 135–145)

## 2017-09-20 MED ORDER — FUROSEMIDE 10 MG/ML IJ SOLN
20.0000 mg | Freq: Once | INTRAMUSCULAR | Status: AC
  Administered 2017-09-20: 20 mg via INTRAVENOUS
  Filled 2017-09-20: qty 4

## 2017-09-20 MED ORDER — PREDNISONE 50 MG PO TABS
50.0000 mg | ORAL_TABLET | Freq: Every day | ORAL | Status: DC
  Administered 2017-09-21 – 2017-09-23 (×3): 50 mg via ORAL
  Filled 2017-09-20 (×3): qty 1

## 2017-09-20 MED ORDER — CEPHALEXIN 500 MG PO CAPS
500.0000 mg | ORAL_CAPSULE | Freq: Two times a day (BID) | ORAL | Status: AC
  Administered 2017-09-20 – 2017-09-23 (×6): 500 mg via ORAL
  Filled 2017-09-20 (×6): qty 1

## 2017-09-20 NOTE — Progress Notes (Signed)
Patient ambulated with walker and O2 at 4L, prior to ambulating sats were 94%. Upon ambulation with O2 at 4L patient initially de-sated to 83% sats then went back up to 94-96% and stayed within this range. Oxygen was removed after first lap and patient de- sated to 86%. Oxygen was re-applied, patient also complained of slight dizziness when de-sating without oxygen on. Patient ambulated back to room and is in bed sats at 94% at rest.

## 2017-09-20 NOTE — Evaluation (Signed)
Occupational Therapy Evaluation Patient Details Name: MADDEN PIAZZA MRN: [id number] DOB: [birth year] Today's Date: 09/20/2017    History of Present Illness presented to hospital for elective removal of L ureteral distal calculus with stent placement. Post-op course complicated by hypoxia and hypotension, necessitating acute hospitalization secondary to sepsis related to UTI, acute hypoxic respiratory failure due to COPD/fibrosis.   Clinical Impression   Patient seen for OT evaluation this date.  Patient lives with spouse in a condo, all one level with no steps to enter.  Patient was previously independent with all self care tasks, light meal prep, light homemaking and driving.  Patient now on 4L O2 this date and did not have oxygen previously at home.  She would benefit from skilled OT to maximize safety and independence in daily tasks including energy conservation techniques and adaptive equipment training.  Recommend HHOT upon discharge.     Follow Up Recommendations  Home health OT    Equipment Recommendations       Recommendations for Other Services       Precautions / Restrictions Precautions Precautions: Fall Restrictions Weight Bearing Restrictions: No      Mobility Bed Mobility Overal bed mobility: Modified Independent                Transfers Overall transfer level: Needs assistance Equipment used: Rolling walker (2 wheeled) Transfers: Sit to/from Stand Sit to Stand: Min assist              Balance Overall balance assessment: Needs assistance Sitting-balance support: No upper extremity supported;Feet supported Sitting balance-Leahy Scale: Good     Standing balance support: Bilateral upper extremity supported Standing balance-Leahy Scale: Fair                             ADL either performed or assessed with clinical judgement   ADL Overall ADL's : Needs assistance/impaired Eating/Feeding: Modified independent   Grooming:  Sitting;Modified independent   Upper Body Bathing: Modified independent   Lower Body Bathing: Minimal assistance   Upper Body Dressing : Modified independent   Lower Body Dressing: Minimal assistance   Toilet Transfer: Minimal assistance   Toileting- Clothing Manipulation and Hygiene: Min guard         General ADL Comments: Patient now on oxygen 4L this date and would benefit from energy conservation techniques and adaptive equipment training.      Vision Baseline Vision/History: Wears glasses Wears Glasses: Reading only       Perception     Praxis      Pertinent Vitals/Pain Pain Assessment: No/denies pain Pain Score: 0-No pain     Hand Dominance Right   Extremity/Trunk Assessment Upper Extremity Assessment Upper Extremity Assessment: Generalized weakness   Lower Extremity Assessment Lower Extremity Assessment: Defer to PT evaluation       Communication Communication Communication: No difficulties   Cognition Arousal/Alertness: Awake/alert Behavior During Therapy: WFL for tasks assessed/performed Overall Cognitive Status: Within Functional Limits for tasks assessed                                     General Comments       Exercises     Shoulder Instructions      Home Living Family/patient expects to be discharged to:: Private residence Living Arrangements: Spouse/significant other Available Help at Discharge: Family Type of Home: Apartment Home Access: Level entry  Home Layout: One level         Bathroom Toilet: Standard     Home Equipment: Walker - 2 wheels          Prior Functioning/Environment Level of Independence: Independent        Comments: Indep with ADLs, household mobility; intermittent use of 4WRW with community distances.  Denies fall history; no home O2.        OT Problem List: Decreased strength;Decreased activity tolerance;Decreased knowledge of use of DME or AE;Cardiopulmonary status  limiting activity;Impaired balance (sitting and/or standing)      OT Treatment/Interventions: Self-care/ADL training;DME and/or AE instruction;Therapeutic activities;Therapeutic exercise;Neuromuscular education;Patient/family education;Energy conservation;Balance training    OT Goals(Current goals can be found in the care plan section) Acute Rehab OT Goals Patient Stated Goal: to get moving again, be independent OT Goal Formulation: With patient Time For Goal Achievement: 10/02/17 Potential to Achieve Goals: Good  OT Frequency: Min 2X/week   Barriers to D/C:            Co-evaluation              AM-PAC PT "6 Clicks" Daily Activity     Outcome Measure                 End of Session Equipment Utilized During Treatment: Gait belt  Activity Tolerance: Patient tolerated treatment well Patient left: in bed;with call bell/phone within reach;with bed alarm set  OT Visit Diagnosis: Unsteadiness on feet (R26.81);Muscle weakness (generalized) (M62.81)                Time: 7824-2353 OT Time Calculation (min): 29 min Charges:  OT General Charges $OT Visit: 1 Visit OT Evaluation $OT Eval Low Complexity: 1 Low OT Treatments $Self Care/Home Management : 8-22 mins G-Codes:    Amy T Lovett, OTR/L, CLT   Lovett,Amy 09/20/2017, 3:34 PM

## 2017-09-20 NOTE — Progress Notes (Signed)
[place] at [place] NAME: Cassandra [name]    MR#:  [id number]  DATE OF BIRTH:  19-Sep-1932  SUBJECTIVE:  CHIEF COMPLAINT:  No chief complaint on file.  Feels weak and on 4 L O2 Afebrile  REVIEW OF SYSTEMS:  Review of Systems  Constitutional: Positive for malaise/fatigue. Negative for chills and fever.  HENT: Negative for congestion, ear discharge, hearing loss and nosebleeds.   Eyes: Negative for blurred vision and double vision.  Respiratory: Positive for shortness of breath. Negative for cough and wheezing.   Cardiovascular: Negative for chest pain, palpitations and leg swelling.  Gastrointestinal: Negative for abdominal pain, constipation, diarrhea, nausea and vomiting.  Genitourinary: Positive for hematuria. Negative for dysuria.  Musculoskeletal: Negative for myalgias.  Neurological: Negative for dizziness, speech change, focal weakness, seizures and headaches.  Psychiatric/Behavioral: Negative for depression. The patient has insomnia.     DRUG ALLERGIES:  No Known Allergies  VITALS:  Blood pressure (!) 143/53, pulse 69, temperature 97.9 F (36.6 C), temperature source Oral, resp. rate (!) 22, height 5\' 2"  (1.575 m), weight 75.8 kg (167 lb), SpO2 91 %.  PHYSICAL EXAMINATION:  Physical Exam  GENERAL:  84 y.o.-year-old patient lying in the bed with no acute distress. Appears tachypneic at times EYES: Pupils equal, round, reactive to light and accommodation. No scleral icterus. Extraocular muscles intact.  HEENT: Head atraumatic, normocephalic. Oropharynx and nasopharynx clear.  NECK:  Supple, no jugular venous distention. No thyroid enlargement, no tenderness.  LUNGS: Few scattered expiratory wheezing noted-wheezing is more upper airway.  Right basilar crackles heard.. No rhonchi or crepitations. No use of accessory muscles for breathing. Slightly tachypneic with minimal exertion.[name]  CARDIOVASCULAR: S1, S2 normal. No murmurs,  rubs, or gallops.  ABDOMEN: Soft, nontender, nondistended. Bowel sounds present. No organomegaly or mass.  EXTREMITIES: No pedal edema, cyanosis, or clubbing.  NEUROLOGIC: Cranial nerves II through XII are intact. Muscle strength 5/5 in all extremities. Sensation intact. Gait not checked.  PSYCHIATRIC: The patient is alert and oriented x 3.  SKIN: No obvious rash, lesion, or ulcer.    LABORATORY PANEL:   CBC Recent Labs  Lab 09/19/17 0516  WBC 19.3*  HGB 9.2*  HCT 29.1*  PLT 237   ------------------------------------------------------------------------------------------------------------------  Chemistries  Recent Labs  Lab 09/17/17 0507  09/20/17 0302  NA 140   < > 137  K 4.1   < > 4.4  CL 107   < > 98*  CO2 22   < > 28  GLUCOSE 368*   < > 276*  BUN 19   < > 33*  CREATININE 0.96   < > 0.88  CALCIUM 8.1*   < > 8.7*  MG 1.8  --   --    < > = values in this interval not displayed.   ------------------------------------------------------------------------------------------------------------------  Cardiac Enzymes No results for input(s): TROPONINI in the last 168 hours. ------------------------------------------------------------------------------------------------------------------  RADIOLOGY:  Dg Chest 2 View  Result Date: 09/18/2017 CLINICAL DATA:  Hypoxia. EXAM: CHEST  2 VIEW COMPARISON:  Radiograph of September 16, 2017. FINDINGS: The heart size and mediastinal contours are within normal limits. Atherosclerosis of thoracic aorta is noted. Left-sided pacemaker is unchanged in position. Stable diffuse interstitial densities are noted throughout both lungs consistent with pulmonary fibrosis. No acute pulmonary abnormality is noted. No pneumothorax is noted. Small bilateral pleural effusions are noted. The visualized skeletal structures are unremarkable. IMPRESSION: Stable diffuse interstitial densities are noted throughout both lungs consistent with pulmonary  fibrosis.  Small bilateral pleural effusions are noted. Electronically Signed   By: Marijo Conception, M.D.   On: 09/18/2017 16:29    EKG:   Orders placed or performed during the hospital encounter of 08/17/17  . ED EKG  . ED EKG  . EKG 12-Lead  . EKG 12-Lead    ASSESSMENT AND PLAN:   Cassandra [name]  is a 84 y.o. female with a known history of COPD/sleep apnea on CPAP at home, chronic pulmonary fibrosis, CAD, history of kidney stones was admitted for elective left ureteral distal calculus removal and stent placement. However went to ICU for postoperative hypoxia and hypotension   1.  Acute hypoxic respiratory failure in the setting of COPD/chronic pulmonary fibrosis -Off BiPAP. Appreciate pulmonary consult -Still has scattered wheezing/crackles. Continue duo nebs.  -on IV steroids, inhalers and monitor. -Not on home oxygen. Requiring 4-5 L O2. Incentive spirometry - Lasix IV 20 mg today Repeat CXR in AM Discussed with Dr. Leonidas Romberg  2.  Sepsis-likely UTI in the setting of new stent placement. -Appreciate urology consult.  Plans to remove the stent as outpatient after discharge next week -Blood pressure is improving. Off pressors -Blood pressure medications held -Was on rocephine and now keflex PO - leukocytosis from steroids  3.  history of chronic atrial flutter on oral anticoagulation. -Rate controlled. She has a pacemaker for sick sinus syndrome. -On eliquis for anticoagulation  4.  Diabetes - Sliding scale insulin - Increased Lantus to 60 units at bedtime and pre-meal insulin - Sugars are elevated from steroids and also patient getting milkshakes from home. Steroid dose decreased today  5.  DVT prophylaxis on Eliquis Monitor with her hematuria  All the records are reviewed and case discussed with Care Management/Social Worker Management plans discussed with the patient, family and they are in agreement.  CODE STATUS: Full code  TOTAL TIME TAKING CARE OF THIS PATIENT: 35  minutes.   POSSIBLE D/C IN 1-2 DAYS, DEPENDING ON CLINICAL CONDITION.  Neita Carp M.D on 09/20/2017 at 11:22 AM  Between 7am to 6pm - Pager - [phone]  After 6pm go to [email or web address] - password [email or web address] [facility]  Office  [phone]  CC: Primary care physician; Idelle Crouch, MD

## 2017-09-21 ENCOUNTER — Inpatient Hospital Stay: Payer: Medicare Other

## 2017-09-21 LAB — GLUCOSE, CAPILLARY
GLUCOSE-CAPILLARY: 100 mg/dL — AB (ref 65–99)
Glucose-Capillary: 46 mg/dL — ABNORMAL LOW (ref 65–99)
Glucose-Capillary: 46 mg/dL — ABNORMAL LOW (ref 65–99)
Glucose-Capillary: 150 mg/dL — ABNORMAL HIGH (ref 65–99)
Glucose-Capillary: 270 mg/dL — ABNORMAL HIGH (ref 65–99)
Glucose-Capillary: 232 mg/dL — ABNORMAL HIGH (ref 65–99)

## 2017-09-21 LAB — BASIC METABOLIC PANEL
Anion gap: 10 (ref 5–15)
BUN: 40 mg/dL — AB (ref 6–20)
CHLORIDE: 98 mmol/L — AB (ref 101–111)
CO2: 31 mmol/L (ref 22–32)
Calcium: 8.5 mg/dL — ABNORMAL LOW (ref 8.9–10.3)
Creatinine, Ser: 0.88 mg/dL (ref 0.44–1.00)
GFR calc Af Amer: >60 mL/min (ref >60)
GFR calc non Af Amer: 60 mL/min — ABNORMAL LOW (ref >60)
Glucose, Bld: 59 mg/dL — ABNORMAL LOW (ref 65–99)
POTASSIUM: 3.4 mmol/L — AB (ref 3.5–5.1)
SODIUM: 139 mmol/L (ref 135–145)

## 2017-09-21 LAB — CULTURE, BLOOD (ROUTINE X 2)
CULTURE: NO GROWTH
Culture: NO GROWTH
Special Requests: ADEQUATE

## 2017-09-21 LAB — MAGNESIUM: MAGNESIUM: 1.9 mg/dL (ref 1.7–2.4)

## 2017-09-21 MED ORDER — POTASSIUM CHLORIDE CRYS ER 20 MEQ PO TBCR
40.0000 meq | EXTENDED_RELEASE_TABLET | ORAL | Status: AC
Start: 2017-09-21 — End: 2017-09-21
  Administered 2017-09-21 (×2): 40 meq via ORAL
  Filled 2017-09-21 (×2): qty 2

## 2017-09-21 MED ORDER — ZOLPIDEM TARTRATE 5 MG PO TABS
5.0000 mg | ORAL_TABLET | Freq: Every day | ORAL | Status: DC
  Administered 2017-09-21 – 2017-09-22 (×2): 5 mg via ORAL
  Filled 2017-09-21 (×2): qty 1

## 2017-09-21 MED ORDER — INSULIN GLARGINE 100 UNIT/ML ~~LOC~~ SOLN
50.0000 [IU] | Freq: Every day | SUBCUTANEOUS | Status: DC
  Administered 2017-09-21: 50 [IU] via SUBCUTANEOUS
  Filled 2017-09-21 (×2): qty 0.5

## 2017-09-21 MED ORDER — FUROSEMIDE 10 MG/ML IJ SOLN
20.0000 mg | Freq: Once | INTRAMUSCULAR | Status: AC
  Administered 2017-09-21: 20 mg via INTRAVENOUS
  Filled 2017-09-21: qty 4

## 2017-09-21 NOTE — Progress Notes (Addendum)
Hypoglycemic Event  CBG: 46  Treatment: 4 oz orange juice  Symptoms: None  Follow-up CBG: Time:0756 and 0822 CBG Result:46 and 100  Possible Reasons for Event: Lantus at bedtime is at 60 units. Pt is also receiving metformin BID with meals.       Cassandra [name]

## 2017-09-21 NOTE — Progress Notes (Signed)
[place] [facility] [place] NAME: Cassandra [name]    MR#:  [id number]  DATE OF BIRTH:  10-Jan-1933  SUBJECTIVE:  CHIEF COMPLAINT:  No chief complaint on file.  Shortness of breath has improved since starting IV Lasix.  -7 L.  Has ongoing mild hematuria due to recent cystoscopy.  REVIEW OF SYSTEMS:  Review of Systems  Constitutional: Positive for malaise/fatigue. Negative for chills and fever.  HENT: Negative for congestion, ear discharge, hearing loss and nosebleeds.   Eyes: Negative for blurred vision and double vision.  Respiratory: Positive for shortness of breath. Negative for cough and wheezing.   Cardiovascular: Negative for chest pain, palpitations and leg swelling.  Gastrointestinal: Negative for abdominal pain, constipation, diarrhea, nausea and vomiting.  Genitourinary: Positive for hematuria. Negative for dysuria.  Musculoskeletal: Negative for myalgias.  Neurological: Negative for dizziness, speech change, focal weakness, seizures and headaches.  Psychiatric/Behavioral: Negative for depression. The patient has insomnia.     DRUG ALLERGIES:  No Known Allergies  VITALS:  Blood pressure (!) 119/45, pulse 60, temperature 97.6 F (36.4 C), resp. rate 18, height 5\' 2"  (1.575 m), weight 75.8 kg (167 lb), SpO2 96 %.  PHYSICAL EXAMINATION:  Physical Exam  GENERAL:  84 y.o.-year-old patient lying in the bed with no acute distress. Appears tachypneic at times EYES: Pupils equal, round, reactive to light and accommodation. No scleral icterus. Extraocular muscles intact.  HEENT: Head atraumatic, normocephalic. Oropharynx and nasopharynx clear.  NECK:  Supple, no jugular venous distention. No thyroid enlargement, no tenderness.  LUNGS: Few scattered expiratory wheezing noted-wheezing is more upper airway.  Right basilar crackles heard.. No rhonchi or crepitations. No use of accessory muscles for breathing. Slightly tachypneic with minimal  exertion.[name]  CARDIOVASCULAR: S1, S2 normal. No murmurs, rubs, or gallops.  ABDOMEN: Soft, nontender, nondistended. Bowel sounds present. No organomegaly or mass.  EXTREMITIES: No pedal edema, cyanosis, or clubbing.  NEUROLOGIC: Cranial nerves II through XII are intact. Muscle strength 5/5 in all extremities. Sensation intact. Gait not checked.  PSYCHIATRIC: The patient is alert and oriented x 3.  SKIN: No obvious rash, lesion, or ulcer.    LABORATORY PANEL:   CBC Recent Labs  Lab 09/19/17 0516  WBC 19.3*  HGB 9.2*  HCT 29.1*  PLT 237   ------------------------------------------------------------------------------------------------------------------  Chemistries  Recent Labs  Lab 09/21/17 0456  NA 139  K 3.4*  CL 98*  CO2 31  GLUCOSE 59*  BUN 40*  CREATININE 0.88  CALCIUM 8.5*  MG 1.9   ------------------------------------------------------------------------------------------------------------------  Cardiac Enzymes No results for input(s): TROPONINI in the last 168 hours. ------------------------------------------------------------------------------------------------------------------  RADIOLOGY:  Dg Chest 2 View  Result Date: 09/21/2017 CLINICAL DATA:  84 year old female with recent hypoxia.  CHF. EXAM: CHEST  2 VIEW COMPARISON:  09/18/2017 and earlier. FINDINGS: Stable left chest dual lead cardiac pacemaker. Stable mild cardiomegaly. Calcified aortic atherosclerosis. Visualized tracheal air column is within normal limits. No pneumothorax. Pulmonary vascularity does appear decreased since 09/18/2017, with residual chronic bilateral pulmonary interstitial changes. Small bilateral pleural effusions have regressed, including less fluid in the major fissures. No consolidation or confluent pulmonary opacity. Osteopenia. No acute osseous abnormality identified. Negative visible bowel gas pattern. IMPRESSION: 1. Regression of pulmonary interstitial edema and small bilateral  pleural effusions since 09/18/2017 with improved ventilation. 2. Underlying chronic pulmonary interstitial changes and Aortic Atherosclerosis (ICD10-I70.0). Electronically Signed   By: Genevie Ann M.D.   On: 09/21/2017 08:52    EKG:   Orders placed  or performed during the hospital encounter of 08/17/17  . ED EKG  . ED EKG  . EKG 12-Lead  . EKG 12-Lead    ASSESSMENT AND PLAN:   Cassandra [name]  is a 84 y.o. female with a known history of COPD/sleep apnea on CPAP at home, chronic pulmonary fibrosis, CAD, history of kidney stones was admitted for elective left ureteral distal calculus removal and stent placement. However went to ICU for postoperative hypoxia and hypotension  #Acute on chronic diastolic congestive heart failure.  Good diuresis with Lasix and shortness of breath is improving.  -7 L.  Will give 1 more dose of IV Lasix today.  Patient is on home Lasix 40 twice daily which needs to be continued at discharge.  #  Acute hypoxic respiratory failure in the setting of COPD/chronic pulmonary fibrosis/CHF -Off BiPAP. Appreciate pulmonary consult -Still has scattered wheezing/crackles. Continue duo nebs.  -on IV steroids, inhalers and monitor.  Change to oral prednisone -Not on home oxygen.  Weaned down to 2 L oxygen today  Pulmonary follow-up after discharge with Dr. Ashby Dawes.(New visit)  #  Sepsis-likely UTI in the setting of new stent placement. -Appreciate urology consult.  Plans to remove the stent as outpatient after discharge next week -Blood pressure is improving. Off pressors -Blood pressure medications held -Was on rocephine and now keflex PO.  Can stop Keflex on 09/23/2017 - leukocytosis from steroids  #  history of chronic atrial flutter on oral anticoagulation. -Rate controlled. She has a pacemaker for sick sinus syndrome. -On eliquis for anticoagulation  #  Diabetes uncontrolled with hyperglycemia due to IV steroids IV steroids stopped yesterday and blood  glucose at 48 today. Reduce dose of Lantus.  Continue pre-meal NovoLog.  #  DVT prophylaxis on Eliquis Monitor with her hematuria  All the records are reviewed and case discussed with Care Management/Social Worker Management plans discussed with the patient, family and they are in agreement.  CODE STATUS: Full code  TOTAL TIME TAKING CARE OF THIS PATIENT: 35 minutes.   POSSIBLE D/C IN 1-2 DAYS, DEPENDING ON CLINICAL CONDITION.  Home health at discharge PT/RN  Neita Carp M.D on 09/21/2017 at 12:04 PM  Between 7am to 6pm - Pager - [phone]  After 6pm go to [email or web address] - password [email or web address] [facility]  Office  [phone]  CC: Primary care physician; Idelle Crouch, MD

## 2017-09-21 NOTE — Care Management Note (Signed)
Case Management Note  Patient Details  Name: Cassandra [name] MRN: [id number] Date of Birth: [birth year]  Subjective/Objective:    Barriers to Discharge today: Glucose 48. Wheezes and crackles. Anticipate discharge on 09/22/17 if blood glucose stabilizes.                 Action/Plan:   Expected Discharge Date:  09/16/17               Expected Discharge Plan:     In-House Referral:     Discharge planning Services     Post Acute Care Choice:    Choice offered to:     DME Arranged:    DME Agency:     HH Arranged:    HH Agency:     Status of Service:     If discussed at [employer], dates discussed:    Additional Comments:  Raydin Bielinski A, RN 09/21/2017, 4:36 PM

## 2017-09-21 NOTE — Progress Notes (Signed)
SATURATION QUALIFICATIONS: (This note is used to comply with regulatory documentation for home oxygen)  Patient Saturations on Room Air at Rest = 94%  Patient Saturations on Room Air while Ambulating = 86%  Patient Saturations on 2 Liters of oxygen while Ambulating = 94%

## 2017-09-22 LAB — GLUCOSE, CAPILLARY
GLUCOSE-CAPILLARY: 83 mg/dL (ref 65–99)
GLUCOSE-CAPILLARY: 195 mg/dL — AB (ref 65–99)
Glucose-Capillary: 235 mg/dL — ABNORMAL HIGH (ref 65–99)
Glucose-Capillary: 189 mg/dL — ABNORMAL HIGH (ref 65–99)

## 2017-09-22 LAB — BASIC METABOLIC PANEL
ANION GAP: 11 (ref 5–15)
BUN: 37 mg/dL — AB (ref 6–20)
CHLORIDE: 97 mmol/L — AB (ref 101–111)
CO2: 29 mmol/L (ref 22–32)
Calcium: 8.3 mg/dL — ABNORMAL LOW (ref 8.9–10.3)
Creatinine, Ser: 0.92 mg/dL (ref 0.44–1.00)
GFR calc Af Amer: >60 mL/min (ref >60)
GFR, EST NON AFRICAN AMERICAN: 56 mL/min — AB (ref >60)
Glucose, Bld: 105 mg/dL — ABNORMAL HIGH (ref 65–99)
POTASSIUM: 4 mmol/L (ref 3.5–5.1)
Sodium: 137 mmol/L (ref 135–145)

## 2017-09-22 LAB — CBC WITH DIFFERENTIAL/PLATELET
BAND NEUTROPHILS: 1 %
BASOS ABS: 0 10*3/uL (ref 0–0.1)
BASOS PCT: 0 %
BLASTS: 0 %
EOS ABS: 0 10*3/uL (ref 0–0.7)
EOS PCT: 0 %
HCT: 32.3 % — ABNORMAL LOW (ref 35.0–47.0)
Hemoglobin: 10.5 g/dL — ABNORMAL LOW (ref 12.0–16.0)
LYMPHS ABS: 3.2 10*3/uL (ref 1.0–3.6)
LYMPHS PCT: 19 %
MCH: 24.5 pg — AB (ref 26.0–34.0)
MCHC: 32.4 g/dL (ref 32.0–36.0)
MCV: 75.6 fL — ABNORMAL LOW (ref 80.0–100.0)
METAMYELOCYTES PCT: 2 %
MONOS PCT: 7 %
Monocytes Absolute: 1.2 10*3/uL — ABNORMAL HIGH (ref 0.2–0.9)
Myelocytes: 1 %
NEUTROS ABS: 12.3 10*3/uL — AB (ref 1.4–6.5)
Neutrophils Relative %: 70 %
OTHER: 0 %
Platelets: 346 10*3/uL (ref 150–440)
Promyelocytes Absolute: 0 %
RBC: 4.28 MIL/uL (ref 3.80–5.20)
RDW: 16.7 % — AB (ref 11.5–14.5)
WBC: 16.7 10*3/uL — ABNORMAL HIGH (ref 3.6–11.0)
nRBC: 0 /100 WBC

## 2017-09-22 MED ORDER — ADULT MULTIVITAMIN W/MINERALS CH
1.0000 | ORAL_TABLET | Freq: Every day | ORAL | Status: DC
  Administered 2017-09-22 – 2017-09-23 (×2): 1 via ORAL
  Filled 2017-09-22 (×2): qty 1

## 2017-09-22 MED ORDER — INSULIN GLARGINE 100 UNIT/ML ~~LOC~~ SOLN
45.0000 [IU] | Freq: Every day | SUBCUTANEOUS | Status: DC
  Administered 2017-09-22: 45 [IU] via SUBCUTANEOUS
  Filled 2017-09-22 (×2): qty 0.45

## 2017-09-22 MED ORDER — PREMIER PROTEIN SHAKE
11.0000 [oz_av] | Freq: Two times a day (BID) | ORAL | Status: DC
  Administered 2017-09-22 – 2017-09-23 (×2): 11 [oz_av] via ORAL

## 2017-09-22 NOTE — Progress Notes (Signed)
PT Cancellation Note  Patient Details Name: Cassandra [name] MRN: [id number] DOB: 11/15/1932   Cancelled Treatment:    Reason Eval/Treat Not Completed: (Treatment session attempted.  Patient just completed 2 laps around nursing station on RA without difficulty this AM.  Planning for tentative discharge home this date.  Declines additional activity at this time.  will continue efforts at later time/date if patient remains hospitalized.)   Able Malloy H. Owens Shark, PT, DPT, NCS 09/22/17, 11:09 AM [phone]

## 2017-09-22 NOTE — Progress Notes (Signed)
[place] [facility] [place] NAME: Cassandra [name]    MR#:  [id number]  DATE OF BIRTH:  1933/08/10  SUBJECTIVE:  CHIEF COMPLAINT:  No chief complaint on file.  Shortness of breath has improved since starting IV Lasix.  -9 L, feels constipated, blood sugar somewhat low this am  REVIEW OF SYSTEMS:  Review of Systems  Constitutional: Positive for malaise/fatigue. Negative for chills and fever.  HENT: Negative for congestion, ear discharge, hearing loss and nosebleeds.   Eyes: Negative for blurred vision and double vision.  Respiratory: Positive for shortness of breath. Negative for cough and wheezing.   Cardiovascular: Negative for chest pain, palpitations and leg swelling.  Gastrointestinal: Negative for abdominal pain, constipation, diarrhea, nausea and vomiting.  Genitourinary: Positive for hematuria. Negative for dysuria.  Musculoskeletal: Negative for myalgias.  Neurological: Negative for dizziness, speech change, focal weakness, seizures and headaches.  Psychiatric/Behavioral: Negative for depression. The patient has insomnia.    DRUG ALLERGIES:  No Known Allergies  VITALS:  Blood pressure (!) 136/54, pulse 70, temperature 98.2 F (36.8 C), temperature source Oral, resp. rate 18, height 5\' 2"  (1.575 m), weight 75.8 kg (167 lb), SpO2 91 %.  PHYSICAL EXAMINATION:  Physical Exam  GENERAL:  84 y.o.-year-old patient lying in the bed with no acute distress. Appears tachypneic at times EYES: Pupils equal, round, reactive to light and accommodation. No scleral icterus. Extraocular muscles intact.  HEENT: Head atraumatic, normocephalic. Oropharynx and nasopharynx clear.  NECK:  Supple, no jugular venous distention. No thyroid enlargement, no tenderness.  LUNGS: Few scattered expiratory wheezing noted-wheezing is more upper airway.  Right basilar crackles heard.. No rhonchi or crepitations. No use of accessory muscles for breathing. Slightly  tachypneic with minimal exertion.[name]  CARDIOVASCULAR: S1, S2 normal. No murmurs, rubs, or gallops.  ABDOMEN: Soft, nontender, nondistended. Bowel sounds present. No organomegaly or mass.  EXTREMITIES: No pedal edema, cyanosis, or clubbing.  NEUROLOGIC: Cranial nerves II through XII are intact. Muscle strength 5/5 in all extremities. Sensation intact. Gait not checked.  PSYCHIATRIC: The patient is alert and oriented x 3.  SKIN: No obvious rash, lesion, or ulcer.    LABORATORY PANEL:   CBC Recent Labs  Lab 09/22/17 0500  WBC 16.7*  HGB 10.5*  HCT 32.3*  PLT 346   ------------------------------------------------------------------------------------------------------------------  Chemistries  Recent Labs  Lab 09/21/17 0456 09/22/17 0500  NA 139 137  K 3.4* 4.0  CL 98* 97*  CO2 31 29  GLUCOSE 59* 105*  BUN 40* 37*  CREATININE 0.88 0.92  CALCIUM 8.5* 8.3*  MG 1.9  --    ------------------------------------------------------------------------------------------------------------------  Cardiac Enzymes No results for input(s): TROPONINI in the last 168 hours. ------------------------------------------------------------------------------------------------------------------  RADIOLOGY:  Dg Chest 2 View  Result Date: 09/21/2017 CLINICAL DATA:  84 year old female with recent hypoxia.  CHF. EXAM: CHEST  2 VIEW COMPARISON:  09/18/2017 and earlier. FINDINGS: Stable left chest dual lead cardiac pacemaker. Stable mild cardiomegaly. Calcified aortic atherosclerosis. Visualized tracheal air column is within normal limits. No pneumothorax. Pulmonary vascularity does appear decreased since 09/18/2017, with residual chronic bilateral pulmonary interstitial changes. Small bilateral pleural effusions have regressed, including less fluid in the major fissures. No consolidation or confluent pulmonary opacity. Osteopenia. No acute osseous abnormality identified. Negative visible bowel gas pattern.  IMPRESSION: 1. Regression of pulmonary interstitial edema and small bilateral pleural effusions since 09/18/2017 with improved ventilation. 2. Underlying chronic pulmonary interstitial changes and Aortic Atherosclerosis (ICD10-I70.0). Electronically Signed   By: Genevie Ann  M.D.   On: 09/21/2017 08:52    EKG:   Orders placed or performed during the hospital encounter of 08/17/17  . ED EKG  . ED EKG  . EKG 12-Lead  . EKG 12-Lead    ASSESSMENT AND PLAN:   Cassandra [name]  is a 84 y.o. female with a known history of COPD/sleep apnea on CPAP at home, chronic pulmonary fibrosis, CAD, history of kidney stones was admitted for elective left ureteral distal calculus removal and stent placement. However went to ICU for postoperative hypoxia and hypotension  #Acute on chronic diastolic congestive heart failure.  Good diuresis with Lasix and shortness of breath is improving.  -9 L.   - continue lasix  #  Acute hypoxic respiratory failure in the setting of COPD/chronic pulmonary fibrosis/CHF -Off BiPAP. Appreciate pulmonary consult -Still has scattered wheezing/crackles. Continue duo nebs.  -on steroids, inhalers and monitor.  Change to oral prednisone -Not on home oxygen.  Weaned down to room air  Pulmonary follow-up after discharge with Dr. Ashby Dawes.(New visit)  #  Sepsis-likely UTI in the setting of new stent placement. -Appreciate urology consult.  Plans to remove the stent as outpatient after discharge next week -Blood pressure is improving. Off pressors -Blood pressure medications held -Was on rocephine and now keflex PO.  Can stop Keflex on 09/23/2017 - leukocytosis from steroids  #  history of chronic atrial flutter on oral anticoagulation. -Rate controlled. She has a pacemaker for sick sinus syndrome. -On eliquis for anticoagulation  #  Diabetes uncontrolled with hyperglycemia due to IV steroids - sugar low, reduce dose of Lantus to 45 units.  Continue pre-meal NovoLog.  *  constipation - agressive bowel regimen  #  DVT prophylaxis on Eliquis Monitor with her hematuria    All the records are reviewed and case discussed with Care Management/Social Worker Management plans discussed with the patient, family (husband at bedside) and they are in agreement.  CODE STATUS: Full code  TOTAL TIME TAKING CARE OF THIS PATIENT: 35 minutes.   POSSIBLE D/C IN 1 DAYS, DEPENDING ON CLINICAL CONDITION.  Home health at discharge PT/RN  Max Sane M.D on 09/22/2017 at 5:07 PM  Between 7am to 6pm - Pager - [phone]  After 6pm go to [email or web address] - password [email or web address] [facility]  Office  [phone]  CC: Primary care physician; Idelle Crouch, MD

## 2017-09-22 NOTE — Progress Notes (Signed)
Inpatient Diabetes Program Recommendations  AACE/ADA: New Consensus Statement on Inpatient Glycemic Control (2015)  Target Ranges:  Prepandial:   less than 140 mg/dL      Peak postprandial:   less than 180 mg/dL (1-2 hours)      Critically ill patients:  140 - 180 mg/dL   Results for ACELYN, BASHAM (MRN [id number]) as of 09/22/2017 10:08  Ref. Range 09/21/2017 07:35 09/21/2017 07:56 09/21/2017 08:22 09/21/2017 11:28 09/21/2017 16:45 09/21/2017 21:06  Glucose-Capillary Latest Ref Range: 65 - 99 mg/dL 46 (L) 46 (L) 100 (H) 150 (H) 270 (H) 232 (H)   Results for SHAUNAE, SIELOFF (MRN [id number]) as of 09/22/2017 10:08  Ref. Range 09/22/2017 07:32  Glucose-Capillary Latest Ref Range: 65 - 99 mg/dL 83    Home DM Meds: Humalog 12 Units BIDWC     Lantus 50 Units QHS    Metformin 1000 mg BID   Current Insulin Orders: Lantus 50 units QHS      Novolog Moderate Correction Scale/ SSI (0-15 units) TID AC + HS      Novolog 8 units TID      Metformin 1000 mg BID     MD- Note patient had Hypoglycemia yesterday AM after receiving 60 units Lantus the night prior.  Lantus dose reduced to 50 units QHS last night.  AM CBG today improved: 83 mg/dl.  May consider the following:  1. Reduce Lantus slightly to 45 units QHS  2. Increase Novolog Meal Coverage to: Novolog 10 units TID with meals (hold if pt eats <50% of meal)      --Will follow patient during hospitalization--  Wyn Quaker RN, MSN, CDE Diabetes Coordinator Inpatient Glycemic Control Team Team Pager: [phone] (8a-5p)

## 2017-09-22 NOTE — Progress Notes (Signed)
cpap on standby. Patient will have RN to call if she decides she wants to wear tonight.

## 2017-09-22 NOTE — Plan of Care (Signed)
Pt sats on Rm air are WNL. No s/s of SOB or exertions.Ambulating independently and sats are normal on rm air.

## 2017-09-22 NOTE — Progress Notes (Signed)
SATURATION QUALIFICATIONS: (This note is used to comply with regulatory documentation for home oxygen)  Patient Saturations on Room Air at Rest = 99%  Patient Saturations on Room Air while Ambulating = 95%   Please briefly explain why patient needs home oxygen: Pt does not need home O2. Respiratory status has improved significantly.

## 2017-09-22 NOTE — Progress Notes (Signed)
Family Meeting Note  Advance Directive:yes  Today a meeting took place with the Patient and spouse.  The following clinical team members were present during this meeting:MD  The following were discussed:Patient's diagnosis:   [name] a84 y.o.femalewith a known history of COPD/sleep apnea on CPAP at home, chronic pulmonary fibrosis, CAD, history of kidney stones was admitted for elective left ureteral distal calculus removal and stent placement. However went to ICU for postoperative hypoxia and hypotension  #Acute on chronic diastolic congestive heart failure  # Acute hypoxic respiratory failure in the setting of COPD/chronic pulmonary fibrosis/CHF  # Sepsis-likely UTI in the setting of new stent placement.   Patient's progosis: > 12 months and Goals for treatment: Full Code  Additional follow-up to be provided: Ongoing discussion with Patient and family for consideration for DNR  Time spent during discussion:20 minutes  Max Sane, MD

## 2017-09-22 NOTE — Progress Notes (Signed)
Occupational Therapy Treatment Patient Details Name: Cassandra [name] MRN: [id number] DOB: 29-Jan-1933 Today's Date: 09/22/2017    History of present illness 84yo female presented to hospital for elective removal of L ureteral distal calculus with stent placement. Post-op course complicated by hypoxia and hypotension, necessitating acute hospitalization secondary to sepsis related to UTI, acute hypoxic respiratory failure due to COPD/fibrosis.   OT comments  Pt seen for OT tx this date, on 2L O2. Pt/spouse educated in energy conservation strategies including AE/DME, home/routines modifications, activity pacing, work simplification, and falls prevention strategies to support maximal safety and independence with ADL and mobility while minimizing falls risk and over exertion. Handout provided. Pt/spouse verbalized understanding. Will continue to progress.   Follow Up Recommendations  Home health OT    Equipment Recommendations       Recommendations for Other Services      Precautions / Restrictions Precautions Precautions: Fall Restrictions Weight Bearing Restrictions: No       Mobility Bed Mobility                  Transfers                      Balance                                           ADL either performed or assessed with clinical judgement   ADL Overall ADL's : Needs assistance/impaired Eating/Feeding: Modified independent   Grooming: Sitting;Modified independent   Upper Body Bathing: Modified independent   Lower Body Bathing: Minimal assistance   Upper Body Dressing : Modified independent   Lower Body Dressing: Minimal assistance   Toilet Transfer: Minimal assistance   Toileting- Clothing Manipulation and Hygiene: Min guard         General ADL Comments: Patient now on oxygen 2L this date. Pt/spouse educated in energy conservation strategies including AE/DME, home/routines modifications, activity pacing, work  simplification, and falls prevention strategies to support maximal safety and independence with ADL and mobility while minimizing falls risk and over exertion. Handout provided. Pt/spouse verbalized understanding.      Vision Baseline Vision/History: Wears glasses Wears Glasses: Reading only Patient Visual Report: No change from baseline     Perception     Praxis      Cognition Arousal/Alertness: Awake/alert Behavior During Therapy: WFL for tasks assessed/performed Overall Cognitive Status: Within Functional Limits for tasks assessed                                          Exercises     Shoulder Instructions       General Comments      Pertinent Vitals/ Pain       Pain Assessment: No/denies pain  Home Living                                          Prior Functioning/Environment              Frequency  Min 2X/week        Progress Toward Goals  OT Goals(current goals can now be found in the care plan section)  Progress towards OT goals: Progressing  toward goals  Acute Rehab OT Goals Patient Stated Goal: to get moving again, be independent OT Goal Formulation: With patient Time For Goal Achievement: 10/02/17 Potential to Achieve Goals: Good  Plan Discharge plan remains appropriate;Frequency remains appropriate    Co-evaluation                 AM-PAC PT "6 Clicks" Daily Activity     Outcome Measure   Help from another person eating meals?: None Help from another person taking care of personal grooming?: None Help from another person toileting, which includes using toliet, bedpan, or urinal?: A Little Help from another person bathing (including washing, rinsing, drying)?: A Little Help from another person to put on and taking off regular upper body clothing?: None Help from another person to put on and taking off regular lower body clothing?: A Little 6 Click Score: 21    End of Session    OT Visit  Diagnosis: Unsteadiness on feet (R26.81);Muscle weakness (generalized) (M62.81)   Activity Tolerance Patient tolerated treatment well   Patient Left in bed;with call bell/phone within reach;with bed alarm set;with family/visitor present   Nurse Communication          Time: 0272-5366 OT Time Calculation (min): 11 min  Charges: OT General Charges $OT Visit: 1 Visit OT Treatments $Self Care/Home Management : 8-22 mins   Jeni Salles, MPH, MS, OTR/L ascom [phone] 09/22/17, 12:48 PM

## 2017-09-23 ENCOUNTER — Ambulatory Visit: Payer: Medicare Other

## 2017-09-23 LAB — GLUCOSE, CAPILLARY: Glucose-Capillary: 63 mg/dL — ABNORMAL LOW (ref 65–99)

## 2017-09-23 MED ORDER — PREDNISONE 10 MG (21) PO TBPK: ORAL_TABLET | ORAL | 0 refills | Status: DC

## 2017-09-23 MED ORDER — OPTICHAMBER DIAMOND MISC
1.0000 | Freq: Once | Status: DC
  Filled 2017-09-23: qty 1

## 2017-09-23 MED ORDER — MOMETASONE FURO-FORMOTEROL FUM 200-5 MCG/ACT IN AERO: 2.0000 | INHALATION_SPRAY | Freq: Two times a day (BID) | RESPIRATORY_TRACT | 0 refills | Status: DC

## 2017-09-23 MED ORDER — TIOTROPIUM BROMIDE MONOHYDRATE 18 MCG IN CAPS: 18.0000 ug | ORAL_CAPSULE | Freq: Every morning | RESPIRATORY_TRACT | 12 refills | Status: DC

## 2017-09-23 MED ORDER — ADULT MULTIVITAMIN W/MINERALS CH: 1.0000 | ORAL_TABLET | Freq: Every day | ORAL | 0 refills | Status: AC

## 2017-09-23 NOTE — Care Management Important Message (Signed)
Important Message  Patient Details  Name: Cassandra [name] MRN: [id number] Date of Birth: 1933-09-01   Medicare Important Message Given:  Yes    Beverly Sessions, RN 09/23/2017, 10:24 AM

## 2017-09-23 NOTE — Progress Notes (Signed)
Pt refuses cpap tonight-

## 2017-09-23 NOTE — Progress Notes (Addendum)
AACE/ADA: New Consensus Statement on Inpatient Glycemic Control (2015)  Target Ranges:  Prepandial:   less than 140 mg/dL      Peak postprandial:   less than 180 mg/dL (1-2 hours)      Critically ill patients:  140 - 180 mg/dL   Results for MAXIE, DEBOSE (MRN [id number]) as of 09/23/2017 09:39  Ref. Range 09/22/2017 07:32 09/22/2017 11:32 09/22/2017 16:36 09/22/2017 21:17  Glucose-Capillary Latest Ref Range: 65 - 99 mg/dL 83 235 (H) 195 (H) 189 (H)   Results for DEBBI, STRANDBERG (MRN [id number]) as of 09/23/2017 09:39  Ref. Range 09/23/2017 07:32  Glucose-Capillary Latest Ref Range: 65 - 99 mg/dL 63 (L)     Home DM Meds: Humalog 12 Units BIDWC                                      Lantus 50 Units QHS                                     Metformin 1000 mg BID   Current Insulin Orders: Lantus 45 units QHS                                       Novolog Moderate Correction Scale/ SSI (0-15 units) TID AC + HS                                       Novolog 8 units TID                                       Metformin 1000 mg BID     MD- Note Lantus dose reduced to 45 units QHS last night.  Hypoglycemic this AM: CBG 63 mg/dl.  Afternoon CBGs mildly elevated likely due to Prednisone.  May consider the following:  1. Reduce Lantus slightly to 40 units QHS  2. Increase Novolog Meal Coverage to: Novolog 9 units TID with meals (hold if pt eats <50% of meal)     --Will follow patient during hospitalization--  Wyn Quaker RN, MSN, CDE Diabetes Coordinator Inpatient Glycemic Control Team Team Pager: [phone] (8a-5p)

## 2017-09-23 NOTE — Care Management (Signed)
RNCM met with patient 09/22/17.  Patient admitted with respiratory failure.  Patient lives at home with spouse.  PCP [facility]. Has RW and cane in the home for ambulation if needed.  PT has assessed patient and recommends home health PT.  RNCM discussed having a Therapist, sports and PT follow her at home.  Patient declines services at discharge.  Husband in agreement.  Patient has been weaned from RA and tolerates with ambulation.  Informed patient that should be change her mind prior to discharge to contact RNCM.  Should she decide after discharge that she would like services informed patient that she would need to follow up with PCP.  RNCM signing off.  Please reconsult if indicated.

## 2017-09-23 NOTE — Discharge Instructions (Signed)

## 2017-09-23 NOTE — Progress Notes (Signed)
Discharge teaching given to patient, patient verbalized understanding and had no questions. Patient IV removed. Patient will be transported home by family. All patient belongings gathered prior to leaving.  

## 2017-09-25 ENCOUNTER — Telehealth: Payer: Self-pay

## 2017-09-25 ENCOUNTER — Ambulatory Visit: Payer: Medicare Other

## 2017-09-25 DIAGNOSIS — N200 Calculus of kidney: Secondary | ICD-10-CM

## 2017-09-25 NOTE — Progress Notes (Signed)
Pt presented today for stent removal. Pt tolerated well. No s/s of adverse reaction noted.

## 2017-09-25 NOTE — Telephone Encounter (Signed)
-----   Message from Alisa Graff, [place] sent at 09/24/2017  9:38 AM EST ----- Regarding: Please call Contact: [phone] hospitalist made referral Discharged 1/22

## 2017-09-25 NOTE — Telephone Encounter (Signed)
Patient will follow up with CHF clinic Monday January 28th at 3:00 PM. Thank you for the referral

## 2017-09-26 NOTE — Progress Notes (Deleted)
   Patient ID: Cassandra [name], female    DOB: Jun 08, 1933, 84 y.o.   MRN: [id number]  HPI  Cassandra [name] is a 84 y/o female with a history of  Echo report from 06/16/16 showed an EF of 55-60% due to mild MR. Cardiac catheterization done 04/23/16 showed severe 2 vessel CAD with occlusion of the right coronary artery and reasonable collateralization.   Admitted 09/16/17 due to HF, COPD and pulmonary fibrosis exacerbation. Developed sepsis thought to be due to a UTI. Was discharged after 7 days. Admitted 08/17/17 due to sepsis related to a UTI. Cardiology consult was obtained. Initially given IV antibiotics and then transitioned to oral antibiotics. Discharged after 3 days.   She presents for her initial visit with a chief complaint of   Review of Systems    Physical Exam    Assessment & Plan:  1: Chronic heart failure with preserved ejection fraction- - NYHA class - saw cardiology ([facility]) 06/23/17 - BNP on 08/17/17 was 527.0  2: HTN- - saw PCP ([facility]ks) 09/03/17 - BMP from 09/22/17 reviewed and showed sodium 137, potassium 4.0 and GFR 56  3: Diabetes- - A1c on 07/02/17 was 9.2%

## 2017-09-27 NOTE — Discharge Summary (Signed)
[facility] [place] NAME: Cassandra [name]    MR#:  [id number]  DATE OF BIRTH:  Oct 09, 1932  DATE OF ADMISSION:  09/16/2017   ADMITTING PHYSICIAN: Abbie Sons, MD  DATE OF DISCHARGE: 09/23/2017 12:25 PM  PRIMARY CARE PHYSICIAN: Idelle Crouch, MD   ADMISSION DIAGNOSIS:  Left ureteral stone DISCHARGE DIAGNOSIS:  Active Problems:   Acute respiratory failure with hypoxia ([place])  SECONDARY DIAGNOSIS:   Past Medical History:  Diagnosis Date  . Acquired hypothyroidism 01/06/2014  . Arthritis   . B12 deficiency 02/15/2014  . Benign essential hypertension 01/06/2014  . Breast cancer [facility]) 1982   Right breast cancer - chemotherapy  . CAD (coronary artery disease), native coronary artery 01/06/2014  . Carotid artery calcification   . Chronic airway obstruction, not elsewhere classified 01/06/2014  . Depression   . Diabetes mellitus without complication ([place])   . History of kidney stones 2019   left ureteral stone  . Myocardial infarction ([place]) 1982   small infarct  . Presence of permanent cardiac pacemaker 07/2016  . Pure hypercholesterolemia 01/06/2014  . Skin cancer   . Sleep apnea    uses cpap   HOSPITAL COURSE:  [name] a84 y.o.femalewith a known history of COPD/sleep apnea on CPAP at home, chronic pulmonary fibrosis, CAD, history of kidney stones was admitted for elective left ureteral distal calculus removal and stent placement. However went to ICU for postoperative hypoxia and hypotension  #Acute on chronic diastolic congestive heart failure.  Good diuresis with Lasix and shortness of breath is improving.  -9 L.   - improved with diuresis by lasix  # Acute hypoxic respiratory failure in the setting of COPD/chronic pulmonary fibrosis/CHF - improved on steroids, inhalers and monitor.   - Pulmonary follow-up after discharge with Dr. Ashby Dawes.(New visit) - Patient will follow up with CHF clinic Monday January  28th at 3:00 PM.  # Sepsis-likely UTI in the setting of new stent placement. -Plans to remove the stent as outpatient after discharge next week - Finished course of rocephine   - leukocytosis from steroids  # history of chronic atrial flutter on oralanticoagulation. -Rate controlled. She has a pacemaker for sick sinus syndrome. -On eliquis for anticoagulation  DISCHARGE CONDITIONS:  stable CONSULTS OBTAINED:   DRUG ALLERGIES:  No Known Allergies DISCHARGE MEDICATIONS:   Allergies as of 09/23/2017   No Known Allergies     Medication List    TAKE these medications   apixaban 5 MG Tabs tablet Commonly known as:  ELIQUIS Take 1 tablet (5 mg total) by mouth 2 (two) times daily.   aspirin EC 81 MG tablet Take 81 mg by mouth daily.   citalopram 40 MG tablet Commonly known as:  CELEXA TAKE ONE TABLET BY MOUTH EVERY NIGHT AT BEDTIME.   diltiazem 180 MG 24 hr capsule Commonly known as:  CARDIZEM CD Take 1 capsule (180 mg total) by mouth daily.   furosemide 40 MG tablet Commonly known as:  LASIX Take 40 mg by mouth daily.   HUMALOG KWIKPEN 100 UNIT/ML KiwkPen Generic drug:  insulin lispro Inject 12 Units into the skin 2 (two) times daily before a meal. *Use as directed per sliding scale*   insulin glargine 100 UNIT/ML injection Commonly known as:  LANTUS Inject 0.42 mLs (42 Units total) into the skin at bedtime. What changed:  how much to take   levothyroxine 112 MCG tablet Commonly known as:  SYNTHROID, LEVOTHROID Take 1 tablet (112  mcg total) by mouth daily before breakfast.   metFORMIN 500 MG 24 hr tablet Commonly known as:  GLUCOPHAGE-XR Take 2 tablets (1,000 mg total) by mouth 2 (two) times daily. Please start this on 8/24 What changed:  additional instructions   mometasone-formoterol 200-5 MCG/ACT Aero Commonly known as:  DULERA Inhale 2 puffs into the lungs 2 (two) times daily.   multivitamin with minerals Tabs tablet Take 1 tablet by mouth daily.     nitroGLYCERIN 0.4 MG SL tablet Commonly known as:  NITROSTAT Place 1 tablet (0.4 mg total) under the tongue every 5 (five) minutes as needed for chest pain.   predniSONE 10 MG (21) Tbpk tablet Commonly known as:  STERAPRED UNI-PAK 21 TAB Prednisone 60 mg po daily, taper 10 mg daily until done   ropinirole 5 MG tablet Commonly known as:  REQUIP Take 5 mg by mouth at bedtime.   rosuvastatin 10 MG tablet Commonly known as:  CRESTOR Take 10 mg by mouth at bedtime.   tamsulosin 0.4 MG Caps capsule Commonly known as:  FLOMAX Take 0.4 mg by mouth daily.   tiotropium 18 MCG inhalation capsule Commonly known as:  SPIRIVA Place 1 capsule (18 mcg total) into inhaler and inhale every morning.   trospium 20 MG tablet Commonly known as:  SANCTURA Take 1 tablet (20 mg total) by mouth 2 (two) times daily as needed. For stent irritation   Vitamin D3 2000 units Tabs Take 2,000 Units by mouth daily.        DISCHARGE INSTRUCTIONS:   DIET:  Cardiac diet DISCHARGE CONDITION:  Good ACTIVITY:  Activity as tolerated OXYGEN:  Home Oxygen: No.  Oxygen Delivery: room air DISCHARGE LOCATION:  home   If you experience worsening of your admission symptoms, develop shortness of breath, life threatening emergency, suicidal or homicidal thoughts you must seek medical attention immediately by calling 911 or calling your MD immediately  if symptoms less severe.  You Must read complete instructions/literature along with all the possible adverse reactions/side effects for all the Medicines you take and that have been prescribed to you. Take any new Medicines after you have completely understood and accpet all the possible adverse reactions/side effects.   Please note  You were cared for by a hospitalist during your hospital stay. If you have any questions about your discharge medications or the care you received while you were in the hospital after you are discharged, you can call the unit and  asked to speak with the hospitalist on call if the hospitalist that took care of you is not available. Once you are discharged, your primary care physician will handle any further medical issues. Please note that NO REFILLS for any discharge medications will be authorized once you are discharged, as it is imperative that you return to your primary care physician (or establish a relationship with a primary care physician if you do not have one) for your aftercare needs so that they can reassess your need for medications and monitor your lab values.    On the day of Discharge:  VITAL SIGNS:  Blood pressure 120/60, pulse 84, temperature 98.7 F (37.1 C), temperature source Oral, resp. rate 20, height 5\' 2"  (1.575 m), weight 75.8 kg (167 lb), SpO2 96 %. PHYSICAL EXAMINATION:  GENERAL:  84 y.o.-year-old patient lying in the bed with no acute distress.  EYES: Pupils equal, round, reactive to light and accommodation. No scleral icterus. Extraocular muscles intact.  HEENT: Head atraumatic, normocephalic. Oropharynx and nasopharynx clear.  NECK:  Supple, no jugular venous distention. No thyroid enlargement, no tenderness.  LUNGS: Normal breath sounds bilaterally, no wheezing, rales,rhonchi or crepitation. No use of accessory muscles of respiration.  CARDIOVASCULAR: S1, S2 normal. No murmurs, rubs, or gallops.  ABDOMEN: Soft, non-tender, non-distended. Bowel sounds present. No organomegaly or mass.  EXTREMITIES: No pedal edema, cyanosis, or clubbing.  NEUROLOGIC: Cranial nerves II through XII are intact. Muscle strength 5/5 in all extremities. Sensation intact. Gait not checked.  PSYCHIATRIC: The patient is alert and oriented x 3.  SKIN: No obvious rash, lesion, or ulcer.  DATA REVIEW:   CBC Recent Labs  Lab 09/22/17 0500  WBC 16.7*  HGB 10.5*  HCT 32.3*  PLT 346    Chemistries  Recent Labs  Lab 09/21/17 0456 09/22/17 0500  NA 139 137  K 3.4* 4.0  CL 98* 97*  CO2 31 29  GLUCOSE 59*  105*  BUN 40* 37*  CREATININE 0.88 0.92  CALCIUM 8.5* 8.3*  MG 1.9  --      Follow-up Information    Idelle Crouch, MD. Go on 10/02/2017.   Specialty:  Internal Medicine Why:  Thursday at 11:00am for hospital follow-up Contact information: [place] [address] [phone]        Laverle Hobby, MD. Go on 09/30/2017.   Specialty:  Pulmonary Disease Why:  Tuesday at 9:45am for hospital follow-up/pulm fibrosis Contact information: [place] [address] [phone]        Teodoro Spray, MD. Go on 10/10/2017.   Specialty:  Cardiology Why:  Friday at 2:00pm for hospital follow-up Contact information: [place] [address] [phone]           Please follow up with CHF clinic Monday January 28th at 3:00 PM as scheduled.    Management plans discussed with the patient, family and they are in agreement.  CODE STATUS: Prior   TOTAL TIME TAKING CARE OF THIS PATIENT: 45 minutes.    Max Sane M.D on 09/27/2017 at 4:05 PM  Between 7am to 6pm - Pager - [phone]  After 6pm go to [email or web address] - Proofreader  [facility]  Office  [phone]  CC: Primary care physician; Idelle Crouch, MD   Note: This dictation was prepared with Dragon dictation along with smaller phrase technology. Any transcriptional errors that result from this process are unintentional.

## 2017-09-29 ENCOUNTER — Ambulatory Visit: Payer: Medicare Other | Admitting: Family

## 2017-09-29 ENCOUNTER — Telehealth: Payer: Self-pay | Admitting: Family

## 2017-09-29 NOTE — Telephone Encounter (Signed)
Patient missed her initial appointment at the [facility] on 09/29/17. Will attempt to reschedule.

## 2017-09-30 ENCOUNTER — Ambulatory Visit (INDEPENDENT_AMBULATORY_CARE_PROVIDER_SITE_OTHER): Payer: Medicare Other | Admitting: Internal Medicine

## 2017-09-30 ENCOUNTER — Encounter: Payer: Self-pay | Admitting: Internal Medicine

## 2017-09-30 VITALS — BP 126/70 | HR 92 | Ht 62.0 in | Wt 177.0 lb

## 2017-09-30 DIAGNOSIS — G4733 Obstructive sleep apnea (adult) (pediatric): Secondary | ICD-10-CM | POA: Diagnosis not present

## 2017-09-30 NOTE — Progress Notes (Signed)
[facility] Consultation      Assessment and Plan:  Sleep apnea.  -Patient was diagnosed with sleep apnea remotely, possibly more than 20 years ago. -She is having recurrent symptoms of excessive daytime sleepiness, explained that in order to get her a new machine, we would need to order a new sleep study as per insurance requirements.  Chronic pulmonary edema --Interstitial changes seen on CT chest and chest x-ray, was likely secondary to acute pulmonary edema due to heart failure.  Her dyspnea now seems to be significantly better, therefore we will not pursue further diagnosis of interstitial lung disease at this time, but will continue to monitor. -Continue treatment of underlying volume overload.  Orders Placed This Encounter  Procedures  . Split night study    Standing Status:   Future    Standing Expiration Date:   09/30/2018    Order Specific Question:   Where should this test be performed:    Answer:   [facility] - Pulmonary   Return in about 3 months (around 12/29/2017).    Date: 09/30/2017  MRN# [id number] Cassandra [name] 08/22/1933  Referring Physician: Hospitalist Physician.   Cassandra [name] is a 84 y.o. old female seen in consultation for chief complaint of:    Chief Complaint  Patient presents with  . Hospitalization Follow-up    pt here for hfu: She only c/o cough (non productive). Pt denies sob/wheezing, or chest tightness.  . Sleep Apnea    Sleep Med patient wears 6 hrs nightly.    HPI:   Cassandra [name] a known history of COPD/sleep apnea on CPAP at home, chronic pulmonary fibrosis, CAD, history of kidney stones was admitted on 09/16/17 for elective left ureteral distal calculus removal and stent placement.She is here today with her grand daughter who gives some of the history.   However went to ICU for postoperative hypoxia and hypotension, she was noted to have interstitial pulmonary edema.  She was subsequently  discharged on 09/23/17, and asked to follow-up with pulmonary. She saw Dr. Vella Kohler "100 years ago" she is not sure why she was seen there.   Before she went to the hospital she had mild dyspnea on exertion. She lives with her husband and she drives a car. She does not limited by breathing per se. She is using albuterol a few puffs per day, and spiriva once per day, and she feels that they help.   She worked for [employer], no occupational exposures. She smoked about a ppd until 20 yrs ago. She denies reflux symptoms, she does have a lot of sinus drainage she has tried a nasal spray but it does not help.   She has continued on lasix. She continues to have LE edema and is progressively improving.   She has a history of OSA, she was diagnosed with OSA several years ago. She is using CPAP every night, but it is very old. She has not gotten a new machine since her original machine in around 80.   CBC 07/02/17 ([place]); eosinophils equals 320. Imaging personally reviewed, chest x-ray 09/21/17; interstitial edema.  Bronchiectasis.    PMHX:   Past Medical History:  Diagnosis Date  . Acquired hypothyroidism 01/06/2014  . Arthritis   . B12 deficiency 02/15/2014  . Benign essential hypertension 01/06/2014  . Breast cancer [facility]) 1982   Right breast cancer - chemotherapy  . CAD (coronary artery disease), native coronary artery 01/06/2014  . Carotid artery calcification   . Chronic airway obstruction,  not elsewhere classified 01/06/2014  . Depression   . Diabetes mellitus without complication ([place])   . History of kidney stones 2019   left ureteral stone  . Myocardial infarction ([place]) 1982   small infarct  . Presence of permanent cardiac pacemaker 07/2016  . Pure hypercholesterolemia 01/06/2014  . Skin cancer   . Sleep apnea    uses cpap   Surgical Hx:  Past Surgical History:  Procedure Laterality Date  . APPENDECTOMY    . AUGMENTATION MAMMAPLASTY Bilateral 1982/redo in 2014  . CARDIAC CATHETERIZATION N/A  04/23/2016   Procedure: Left Heart Cath and Coronary Angiography;  Surgeon: Corey Skains, MD;  Location: [facility];  Service: Cardiovascular;  Laterality: N/A;  . CARDIAC CATHETERIZATION Left 04/23/2016   Procedure: Coronary Stent Intervention;  Surgeon: Yolonda Kida, MD;  Location: [facility];  Service: Cardiovascular;  Laterality: Left;  . CAROTID ARTERY ANGIOPLASTY Left 2010   had stent inserted and removed d/t infection. artery from leg inserted in left carotid  . CHOLECYSTECTOMY    . CORONARY ANGIOPLASTY WITH STENT PLACEMENT  september 9th 2017  . CYSTOSCOPY W/ URETERAL STENT PLACEMENT Left 08/17/2017   Procedure: CYSTOSCOPY WITH RETROGRADE PYELOGRAM/URETERAL STENT PLACEMENT;  Surgeon: Festus Aloe, MD;  Location: [facility] OR[facility];  Service: Urology;  Laterality: Left;  . CYSTOSCOPY W/ URETERAL STENT PLACEMENT Left 09/16/2017   Procedure: CYSTOSCOPY WITH STENT REPLACEMENT;  Surgeon: Abbie Sons, MD;  Location: [facility] OR[facility];  Service: Urology;  Laterality: Left;  . CYSTOSCOPY/RETROGRADE/URETEROSCOPY/STONE EXTRACTION WITH BASKET Left 09/16/2017   Procedure: CYSTOSCOPY/RETROGRADE/URETEROSCOPY/STONE EXTRACTION WITH BASKET;  Surgeon: Abbie Sons, MD;  Location: [facility];  Service: Urology;  Laterality: Left;  . EYE SURGERY Bilateral    cataract extraction  . MASTECTOMY Right 1982  . PACEMAKER INSERTION Left 07/03/2016   Procedure: INSERTION PACEMAKER;  Surgeon: Isaias Cowman, MD;  Location: [facility] OR[facility];  Service: Cardiovascular;  Laterality: Left;  . SKIN CANCER EXCISION    . TUBAL LIGATION     Family Hx:  Family History  Problem Relation Age of Onset  . Prostate cancer Neg Hx   . Kidney cancer Neg Hx   . Breast cancer Neg Hx    Social Hx:   Social History   Tobacco Use  . Smoking status: Former Smoker    Packs/day: 0.50    Types: Cigarettes    Last attempt to quit: 09/02/1996    Years since quitting: 21.0  . Smokeless tobacco: Never Used  Substance  Use Topics  . Alcohol use: Yes    Alcohol/week: 0.6 oz    Types: 1 Glasses of wine per week    Comment: occasionally  . Drug use: No   Medication:    Current Outpatient Medications:  .  apixaban (ELIQUIS) 5 MG TABS tablet, Take 1 tablet (5 mg total) by mouth 2 (two) times daily., Disp: 60 tablet, Rfl: 0 .  aspirin EC 81 MG tablet, Take 81 mg by mouth daily., Disp: , Rfl:  .  Cholecalciferol (VITAMIN D3) 2000 units TABS, Take 2,000 Units by mouth daily., Disp: , Rfl:  .  citalopram (CELEXA) 40 MG tablet, TAKE ONE TABLET BY MOUTH EVERY NIGHT AT BEDTIME., Disp: , Rfl:  .  diltiazem (CARDIZEM CD) 180 MG 24 hr capsule, Take 1 capsule (180 mg total) by mouth daily., Disp: 30 capsule, Rfl: 0 .  furosemide (LASIX) 40 MG tablet, Take 40 mg by mouth daily., Disp: , Rfl:  .  HUMALOG KWIKPEN 100 UNIT/ML KiwkPen,  Inject 12 Units into the skin 2 (two) times daily before a meal. *Use as directed per sliding scale*, Disp: , Rfl:  .  insulin glargine (LANTUS) 100 UNIT/ML injection, Inject 0.42 mLs (42 Units total) into the skin at bedtime. (Patient taking differently: Inject 50 Units into the skin at bedtime. ), Disp: 0.42 mL, Rfl: 0 .  levothyroxine (SYNTHROID, LEVOTHROID) 112 MCG tablet, Take 1 tablet (112 mcg total) by mouth daily before breakfast., Disp: 30 tablet, Rfl: 0 .  metFORMIN (GLUCOPHAGE-XR) 500 MG 24 hr tablet, Take 2 tablets (1,000 mg total) by mouth 2 (two) times daily. Please start this on 8/24 (Patient taking differently: Take 1,000 mg by mouth 2 (two) times daily. Pl), Disp: 60 tablet, Rfl: 0 .  mometasone-formoterol (DULERA) 200-5 MCG/ACT AERO, Inhale 2 puffs into the lungs 2 (two) times daily., Disp: 1 Inhaler, Rfl: 0 .  Multiple Vitamin (MULTIVITAMIN WITH MINERALS) TABS tablet, Take 1 tablet by mouth daily., Disp: 30 tablet, Rfl: 0 .  nitroGLYCERIN (NITROSTAT) 0.4 MG SL tablet, Place 1 tablet (0.4 mg total) under the tongue every 5 (five) minutes as needed for chest pain., Disp: 90 tablet,  Rfl: 0 .  ropinirole (REQUIP) 5 MG tablet, Take 5 mg by mouth at bedtime. , Disp: , Rfl:  .  rosuvastatin (CRESTOR) 10 MG tablet, Take 10 mg by mouth at bedtime. , Disp: , Rfl:  .  tamsulosin (FLOMAX) 0.4 MG CAPS capsule, Take 0.4 mg by mouth daily. , Disp: , Rfl:  .  tiotropium (SPIRIVA) 18 MCG inhalation capsule, Place 1 capsule (18 mcg total) into inhaler and inhale every morning., Disp: 30 capsule, Rfl: 12 .  trospium (SANCTURA) 20 MG tablet, Take 1 tablet (20 mg total) by mouth 2 (two) times daily as needed. For stent irritation, Disp: 10 tablet, Rfl: 0   Allergies:  Patient has no known allergies.  Review of Systems: Gen:  Denies  fever, sweats, chills HEENT: Denies blurred vision, double vision. bleeds, sore throat Cvc:  No dizziness, chest pain. Resp:   Denies cough or sputum production, shortness of breath Gi: Denies swallowing difficulty, stomach pain. Gu:  Denies bladder incontinence, burning urine Ext:   No Joint pain, stiffness. Skin: No skin rash,  hives  Endoc:  No polyuria, polydipsia. Psych: No depression, insomnia. Other:  All other systems were reviewed with the patient and were negative other that what is mentioned in the HPI.   Physical Examination:   VS: BP 126/70 (BP Location: Left Arm, Cuff Size: Normal)   Pulse 92   Ht 5\' 2"  (1.575 m)   Wt 177 lb (80.3 kg)   LMP  (LMP Unknown)   SpO2 93%   BMI 32.37 kg/m   General Appearance: No distress  Neuro:without focal findings,  speech normal,  HEENT: PERRLA, EOM intact.   Pulmonary: normal breath sounds, No wheezing.  CardiovascularNormal S1,S2.  No m/r/g.   Abdomen: Benign, Soft, non-tender. Renal:  No costovertebral tenderness  GU:  No performed at this time. Endoc: No evident thyromegaly, no signs of acromegaly. Skin:   warm, no rashes, no ecchymosis  Extremities: normal, no cyanosis, clubbing.  Other findings:    LABORATORY PANEL:   CBC No results for input(s): WBC, HGB, HCT, PLT in the last 168  hours. ------------------------------------------------------------------------------------------------------------------  Chemistries  No results for input(s): NA, K, CL, CO2, GLUCOSE, BUN, CREATININE, CALCIUM, MG, AST, ALT, ALKPHOS, BILITOT in the last 168 hours.  Invalid input(s): GFRCGP ------------------------------------------------------------------------------------------------------------------  Cardiac Enzymes No results for input(s):  TROPONINI in the last 168 hours. ------------------------------------------------------------  RADIOLOGY:  No results found.     Thank  you for the consultation and for allowing [facility] to assist in the care of your patient. Our recommendations are noted above.  Please contact us if we can be of further service.   Marda Stalker, MD.  Board Certified in Internal Medicine, Pulmonary Medicine, [place], and Sleep Medicine.  [facility] Office Number: [phone]  Patricia Pesa, M.D.  Merton Border, M.D  09/30/2017

## 2017-09-30 NOTE — Patient Instructions (Addendum)
Continue using water pills as directed.  Continue to follow up with your primary care doctor and with your cardiologist.  We will send you for a new sleep study in order to qualify you for a new CPAP.

## 2017-10-14 ENCOUNTER — Ambulatory Visit: Payer: Medicare Other | Admitting: Urology

## 2017-10-21 ENCOUNTER — Ambulatory Visit: Payer: Medicare Other | Admitting: Urology

## 2017-10-24 ENCOUNTER — Ambulatory Visit: Payer: Medicare Other | Attending: Internal Medicine

## 2017-10-24 DIAGNOSIS — G4733 Obstructive sleep apnea (adult) (pediatric): Secondary | ICD-10-CM | POA: Diagnosis present

## 2017-10-27 ENCOUNTER — Ambulatory Visit: Payer: Medicare Other | Admitting: Urology

## 2017-10-27 ENCOUNTER — Ambulatory Visit (INDEPENDENT_AMBULATORY_CARE_PROVIDER_SITE_OTHER): Payer: Medicare Other | Admitting: Urology

## 2017-10-27 DIAGNOSIS — R350 Frequency of micturition: Secondary | ICD-10-CM

## 2017-10-27 LAB — URINALYSIS, COMPLETE
BILIRUBIN UA: NEGATIVE
Glucose, UA: NEGATIVE
Ketones, UA: NEGATIVE
Nitrite, UA: NEGATIVE
PH UA: 5.5 (ref 5.0–7.5)
Protein, UA: NEGATIVE
RBC UA: NEGATIVE
Specific Gravity, UA: <1.005 — ABNORMAL LOW (ref 1.005–1.030)
UUROB: 0.2 mg/dL (ref 0.2–1.0)

## 2017-10-27 LAB — MICROSCOPIC EXAMINATION: RBC, UA: NONE SEEN /hpf (ref 0–?)

## 2017-10-28 ENCOUNTER — Ambulatory Visit: Payer: Medicare Other | Admitting: Urology

## 2017-10-28 ENCOUNTER — Encounter: Payer: Self-pay | Admitting: Urology

## 2017-10-28 NOTE — Progress Notes (Signed)
Appointment was rescheduled.

## 2017-10-31 DIAGNOSIS — G4733 Obstructive sleep apnea (adult) (pediatric): Secondary | ICD-10-CM | POA: Diagnosis not present

## 2017-11-04 ENCOUNTER — Telehealth: Payer: Self-pay | Admitting: *Deleted

## 2017-11-04 DIAGNOSIS — G4733 Obstructive sleep apnea (adult) (pediatric): Secondary | ICD-10-CM

## 2017-11-04 NOTE — Telephone Encounter (Signed)
LMTCB for result of sleep study.  Pt positive for sleep apnea. Pt needs cpap titration and 02 titration. 02 baseline 90.2% Lowest 02 63.0% 39 episodes of obstructive apneic events.

## 2017-11-05 NOTE — Telephone Encounter (Signed)
Pt informed of results and made aware the CPAP titration order is being placed. Nothing further needed.

## 2017-11-07 ENCOUNTER — Other Ambulatory Visit: Payer: Self-pay

## 2017-11-07 ENCOUNTER — Emergency Department: Payer: Medicare Other

## 2017-11-07 ENCOUNTER — Observation Stay
Admission: EM | Admit: 2017-11-07 | Discharge: 2017-11-08 | Disposition: A | Discharge status: Home / Self Care | Payer: Medicare Other | Attending: Internal Medicine | Admitting: Internal Medicine

## 2017-11-07 DIAGNOSIS — Z79890 Hormone replacement therapy (postmenopausal): Secondary | ICD-10-CM | POA: Insufficient documentation

## 2017-11-07 DIAGNOSIS — R748 Abnormal levels of other serum enzymes: Secondary | ICD-10-CM | POA: Insufficient documentation

## 2017-11-07 DIAGNOSIS — G2581 Restless legs syndrome: Secondary | ICD-10-CM | POA: Diagnosis not present

## 2017-11-07 DIAGNOSIS — Z794 Long term (current) use of insulin: Secondary | ICD-10-CM | POA: Insufficient documentation

## 2017-11-07 DIAGNOSIS — Z853 Personal history of malignant neoplasm of breast: Secondary | ICD-10-CM | POA: Diagnosis not present

## 2017-11-07 DIAGNOSIS — Z7901 Long term (current) use of anticoagulants: Secondary | ICD-10-CM | POA: Diagnosis not present

## 2017-11-07 DIAGNOSIS — Z87891 Personal history of nicotine dependence: Secondary | ICD-10-CM | POA: Insufficient documentation

## 2017-11-07 DIAGNOSIS — G8929 Other chronic pain: Secondary | ICD-10-CM | POA: Diagnosis not present

## 2017-11-07 DIAGNOSIS — Z955 Presence of coronary angioplasty implant and graft: Secondary | ICD-10-CM | POA: Insufficient documentation

## 2017-11-07 DIAGNOSIS — I4892 Unspecified atrial flutter: Secondary | ICD-10-CM | POA: Insufficient documentation

## 2017-11-07 DIAGNOSIS — M545 Low back pain: Secondary | ICD-10-CM | POA: Insufficient documentation

## 2017-11-07 DIAGNOSIS — I110 Hypertensive heart disease with heart failure: Secondary | ICD-10-CM | POA: Insufficient documentation

## 2017-11-07 DIAGNOSIS — Z79899 Other long term (current) drug therapy: Secondary | ICD-10-CM | POA: Insufficient documentation

## 2017-11-07 DIAGNOSIS — I252 Old myocardial infarction: Secondary | ICD-10-CM | POA: Diagnosis not present

## 2017-11-07 DIAGNOSIS — E7800 Pure hypercholesterolemia, unspecified: Secondary | ICD-10-CM | POA: Diagnosis not present

## 2017-11-07 DIAGNOSIS — R55 Syncope and collapse: Principal | ICD-10-CM | POA: Insufficient documentation

## 2017-11-07 DIAGNOSIS — J449 Chronic obstructive pulmonary disease, unspecified: Secondary | ICD-10-CM | POA: Insufficient documentation

## 2017-11-07 DIAGNOSIS — I509 Heart failure, unspecified: Secondary | ICD-10-CM | POA: Insufficient documentation

## 2017-11-07 DIAGNOSIS — M549 Dorsalgia, unspecified: Secondary | ICD-10-CM

## 2017-11-07 DIAGNOSIS — E039 Hypothyroidism, unspecified: Secondary | ICD-10-CM | POA: Diagnosis not present

## 2017-11-07 DIAGNOSIS — Z950 Presence of cardiac pacemaker: Secondary | ICD-10-CM | POA: Insufficient documentation

## 2017-11-07 DIAGNOSIS — N179 Acute kidney failure, unspecified: Secondary | ICD-10-CM | POA: Diagnosis not present

## 2017-11-07 DIAGNOSIS — I2510 Atherosclerotic heart disease of native coronary artery without angina pectoris: Secondary | ICD-10-CM | POA: Diagnosis not present

## 2017-11-07 DIAGNOSIS — F329 Major depressive disorder, single episode, unspecified: Secondary | ICD-10-CM | POA: Insufficient documentation

## 2017-11-07 DIAGNOSIS — Z7982 Long term (current) use of aspirin: Secondary | ICD-10-CM | POA: Insufficient documentation

## 2017-11-07 DIAGNOSIS — E119 Type 2 diabetes mellitus without complications: Secondary | ICD-10-CM | POA: Insufficient documentation

## 2017-11-07 DIAGNOSIS — R7989 Other specified abnormal findings of blood chemistry: Secondary | ICD-10-CM

## 2017-11-07 DIAGNOSIS — E785 Hyperlipidemia, unspecified: Secondary | ICD-10-CM | POA: Insufficient documentation

## 2017-11-07 DIAGNOSIS — I959 Hypotension, unspecified: Secondary | ICD-10-CM | POA: Diagnosis not present

## 2017-11-07 DIAGNOSIS — Z9221 Personal history of antineoplastic chemotherapy: Secondary | ICD-10-CM | POA: Diagnosis not present

## 2017-11-07 DIAGNOSIS — G4733 Obstructive sleep apnea (adult) (pediatric): Secondary | ICD-10-CM | POA: Insufficient documentation

## 2017-11-07 DIAGNOSIS — Z85828 Personal history of other malignant neoplasm of skin: Secondary | ICD-10-CM | POA: Insufficient documentation

## 2017-11-07 DIAGNOSIS — R778 Other specified abnormalities of plasma proteins: Secondary | ICD-10-CM

## 2017-11-07 LAB — CBC
HCT: 32.8 % — ABNORMAL LOW (ref 35.0–47.0)
Hemoglobin: 10.5 g/dL — ABNORMAL LOW (ref 12.0–16.0)
MCH: 23.3 pg — ABNORMAL LOW (ref 26.0–34.0)
MCHC: 32 g/dL (ref 32.0–36.0)
MCV: 73 fL — ABNORMAL LOW (ref 80.0–100.0)
Platelets: 312 10*3/uL (ref 150–440)
RBC: 4.5 MIL/uL (ref 3.80–5.20)
RDW: 18.1 % — ABNORMAL HIGH (ref 11.5–14.5)
WBC: 10.8 10*3/uL (ref 3.6–11.0)

## 2017-11-07 LAB — BASIC METABOLIC PANEL
Anion gap: 11 (ref 5–15)
BUN: 24 mg/dL — ABNORMAL HIGH (ref 6–20)
CO2: 26 mmol/L (ref 22–32)
Calcium: 8.6 mg/dL — ABNORMAL LOW (ref 8.9–10.3)
Chloride: 97 mmol/L — ABNORMAL LOW (ref 101–111)
Creatinine, Ser: 1.19 mg/dL — ABNORMAL HIGH (ref 0.44–1.00)
GFR calc Af Amer: 48 mL/min — ABNORMAL LOW (ref >60)
GFR, EST NON AFRICAN AMERICAN: 41 mL/min — AB (ref >60)
GLUCOSE: 236 mg/dL — AB (ref 65–99)
Potassium: 3.6 mmol/L (ref 3.5–5.1)
Sodium: 134 mmol/L — ABNORMAL LOW (ref 135–145)

## 2017-11-07 LAB — TROPONIN I
TROPONIN I: 1.39 ng/mL — AB (ref <0.03)
TROPONIN I: 1.39 ng/mL — AB (ref <0.03)
TROPONIN I: 1.37 ng/mL — AB (ref <0.03)

## 2017-11-07 LAB — GLUCOSE, CAPILLARY
GLUCOSE-CAPILLARY: 122 mg/dL — AB (ref 65–99)
GLUCOSE-CAPILLARY: 184 mg/dL — AB (ref 65–99)

## 2017-11-07 MED ORDER — DOCUSATE SODIUM 100 MG PO CAPS: 100.0000 mg | ORAL_CAPSULE | Freq: Two times a day (BID) | ORAL | Status: DC | PRN

## 2017-11-07 MED ORDER — INSULIN ASPART 100 UNIT/ML ~~LOC~~ SOLN
0.0000 [IU] | Freq: Three times a day (TID) | SUBCUTANEOUS | Status: DC
  Administered 2017-11-07: 1 [IU] via SUBCUTANEOUS
  Administered 2017-11-08: 5 [IU] via SUBCUTANEOUS
  Administered 2017-11-08: 3 [IU] via SUBCUTANEOUS
  Filled 2017-11-07 (×3): qty 1

## 2017-11-07 MED ORDER — SODIUM CHLORIDE 0.9 % IV BOLUS (SEPSIS)
250.0000 mL | Freq: Once | INTRAVENOUS | Status: AC
  Administered 2017-11-07: 250 mL via INTRAVENOUS

## 2017-11-07 MED ORDER — VITAMIN D 1000 UNITS PO TABS: 2000.0000 [IU] | ORAL_TABLET | Freq: Every day | ORAL | Status: DC

## 2017-11-07 MED ORDER — DARIFENACIN HYDROBROMIDE ER 7.5 MG PO TB24
7.5000 mg | ORAL_TABLET | Freq: Every day | ORAL | Status: DC | PRN
  Filled 2017-11-07: qty 1

## 2017-11-07 MED ORDER — ADULT MULTIVITAMIN W/MINERALS CH
1.0000 | ORAL_TABLET | Freq: Every day | ORAL | Status: DC
  Administered 2017-11-08: 1 via ORAL
  Filled 2017-11-07: qty 1

## 2017-11-07 MED ORDER — ASPIRIN EC 81 MG PO TBEC
81.0000 mg | DELAYED_RELEASE_TABLET | Freq: Every day | ORAL | Status: DC
  Administered 2017-11-08: 81 mg via ORAL
  Filled 2017-11-07: qty 1

## 2017-11-07 MED ORDER — INSULIN ASPART 100 UNIT/ML ~~LOC~~ SOLN: 0.0000 [IU] | Freq: Every day | SUBCUTANEOUS | Status: DC

## 2017-11-07 MED ORDER — APIXABAN 5 MG PO TABS
5.0000 mg | ORAL_TABLET | Freq: Two times a day (BID) | ORAL | Status: DC
  Administered 2017-11-07 – 2017-11-08 (×2): 5 mg via ORAL
  Filled 2017-11-07 (×2): qty 1

## 2017-11-07 MED ORDER — TIOTROPIUM BROMIDE MONOHYDRATE 18 MCG IN CAPS
18.0000 ug | ORAL_CAPSULE | Freq: Every morning | RESPIRATORY_TRACT | Status: DC
  Filled 2017-11-07: qty 5

## 2017-11-07 MED ORDER — ROPINIROLE HCL 1 MG PO TABS
5.0000 mg | ORAL_TABLET | Freq: Every day | ORAL | Status: DC
  Filled 2017-11-07: qty 5

## 2017-11-07 MED ORDER — MOMETASONE FURO-FORMOTEROL FUM 200-5 MCG/ACT IN AERO
2.0000 | INHALATION_SPRAY | Freq: Two times a day (BID) | RESPIRATORY_TRACT | Status: DC
  Administered 2017-11-07 – 2017-11-08 (×2): 2 via RESPIRATORY_TRACT
  Filled 2017-11-07: qty 8.8

## 2017-11-07 MED ORDER — CITALOPRAM HYDROBROMIDE 20 MG PO TABS
40.0000 mg | ORAL_TABLET | Freq: Every day | ORAL | Status: DC
  Administered 2017-11-07: 40 mg via ORAL
  Filled 2017-11-07: qty 2

## 2017-11-07 MED ORDER — DARIFENACIN HYDROBROMIDE ER 7.5 MG PO TB24: 7.5000 mg | ORAL_TABLET | Freq: Every day | ORAL | Status: DC

## 2017-11-07 MED ORDER — ROSUVASTATIN CALCIUM 10 MG PO TABS
10.0000 mg | ORAL_TABLET | Freq: Every day | ORAL | Status: DC
  Administered 2017-11-07: 10 mg via ORAL
  Filled 2017-11-07: qty 1

## 2017-11-07 MED ORDER — LEVOTHYROXINE SODIUM 112 MCG PO TABS
112.0000 ug | ORAL_TABLET | Freq: Every day | ORAL | Status: DC
  Administered 2017-11-08: 112 ug via ORAL
  Filled 2017-11-07: qty 1

## 2017-11-07 MED ORDER — SODIUM CHLORIDE 0.9% FLUSH
3.0000 mL | Freq: Two times a day (BID) | INTRAVENOUS | Status: DC
  Administered 2017-11-07 – 2017-11-08 (×2): 3 mL via INTRAVENOUS

## 2017-11-07 MED ORDER — VITAMIN D 1000 UNITS PO TABS
2000.0000 [IU] | ORAL_TABLET | Freq: Every day | ORAL | Status: DC
  Administered 2017-11-08: 2000 [IU] via ORAL
  Filled 2017-11-07: qty 2

## 2017-11-07 MED ORDER — INSULIN GLARGINE 100 UNIT/ML ~~LOC~~ SOLN
20.0000 [IU] | Freq: Every day | SUBCUTANEOUS | Status: DC
  Administered 2017-11-07: 20 [IU] via SUBCUTANEOUS
  Filled 2017-11-07 (×2): qty 0.2

## 2017-11-07 MED ORDER — TAMSULOSIN HCL 0.4 MG PO CAPS
0.4000 mg | ORAL_CAPSULE | Freq: Every day | ORAL | Status: DC
  Filled 2017-11-07: qty 1

## 2017-11-07 MED ORDER — ASPIRIN 81 MG PO CHEW
243.0000 mg | CHEWABLE_TABLET | Freq: Once | ORAL | Status: AC
Start: 2017-11-07 — End: 2017-11-07
  Administered 2017-11-07: 243 mg via ORAL
  Filled 2017-11-07: qty 3

## 2017-11-07 MED ORDER — ONDANSETRON HCL 4 MG/2ML IJ SOLN: 4.0000 mg | Freq: Four times a day (QID) | INTRAMUSCULAR | Status: DC | PRN

## 2017-11-07 MED ORDER — ONDANSETRON HCL 4 MG PO TABS: 4.0000 mg | ORAL_TABLET | Freq: Four times a day (QID) | ORAL | Status: DC | PRN

## 2017-11-07 MED ORDER — NITROGLYCERIN 0.4 MG SL SUBL: 0.4000 mg | SUBLINGUAL_TABLET | SUBLINGUAL | Status: DC | PRN

## 2017-11-07 NOTE — ED Triage Notes (Addendum)
Pt to ER via POV c/o back pain since fall on Sunday. Pt reports that she fell in bathroom floor, "straight back". Unwitnessed fall, was assisted up shortly after fall occurred. Pt reports lower back pain has gotten better, continued pain to left flank area. Denies pain in head, neck or upper back currently.

## 2017-11-07 NOTE — Progress Notes (Signed)
Patient admitted to unit. Oriented to room, call bell, and staff. Bed in lowest position. Fall safety plan reviewed. Full assessment to Epic. Skin assessment verified with Gildardo Pounds RN. Telemetry box verification with tele clerk- Box#: [id number]. Will continue to monitor.

## 2017-11-07 NOTE — ED Notes (Signed)
Troponin 1.39 verbal in person to Dr. Lisa Roca, informed that patient needs bed. Agricultural consultant, Microbiologist and first nurse [place], Therapist, sports

## 2017-11-07 NOTE — ED Notes (Signed)
Patient transported to X-ray via stretcher 

## 2017-11-07 NOTE — ED Provider Notes (Signed)
[facility] Emergency Department Provider Note ____________________________________________   I have reviewed the triage vital signs and the triage nursing note.  HISTORY  Chief Complaint Back Pain   Historian Patient And spouse at the bedside  HPI Cassandra [name] is a 84 y.o. female presents for evaluation of left low back pain since Sunday.  She states that she was in her bathroom landing on her makeup to go out with her granddaughter and she passed out and fell onto her left side where she is having some left low back pain into her left hip since then.  She has been walking, she does also use a walker at baseline.  Pain is not gone away so she decided to come in for evaluation.  Denies palpitations or chest pain or shortness of breath on Sunday or since then.  States that she does have congestive heart failure and several weeks ago was admitted in the hospital and had 11 L pulled off.  Reports history of "mild "heart attack many years ago.  Follows with Dr. Ubaldo Glassing, cardiology.   Past Medical History:  Diagnosis Date  . Acquired hypothyroidism 01/06/2014  . Arthritis   . B12 deficiency 02/15/2014  . Benign essential hypertension 01/06/2014  . Breast cancer [facility]) 1982   Right breast cancer - chemotherapy  . CAD (coronary artery disease), native coronary artery 01/06/2014  . Carotid artery calcification   . Chronic airway obstruction, not elsewhere classified 01/06/2014  . Depression   . Diabetes mellitus without complication ([place])   . History of kidney stones 2019   left ureteral stone  . Myocardial infarction ([place]) 1982   small infarct  . Presence of permanent cardiac pacemaker 07/2016  . Pure hypercholesterolemia 01/06/2014  . Skin cancer   . Sleep apnea    uses cpap    Patient Active Problem List   Diagnosis Date Noted  . Acute respiratory failure with hypoxia ([place]) 09/16/2017  . Sepsis ([place]) 08/17/2017  . UTI (urinary tract infection) 08/17/2017   . Acute respiratory distress 08/17/2017  . 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 Sick sinus syndrome ([place]) 07/03/2016  . New onset atrial flutter ([place]) 06/14/2016  . Bradycardia, sinus 06/07/2016  . Syncope 06/06/2016  . Chest pain 04/21/2016  . Elevated troponin 04/21/2016  . Labile hypertension 04/21/2016  . Ischemic chest pain 04/21/2016  . Long-term insulin use ([place]) 05/24/2014  . Mild vitamin D deficiency 05/20/2014  . B12 deficiency 02/15/2014  . Acquired hypothyroidism 01/06/2014  . Chronic airway obstruction, not elsewhere classified 01/06/2014  . CAD (coronary artery disease), native coronary artery 01/06/2014  . Benign essential hypertension 01/06/2014  . Headache 01/06/2014  . Hypersomnia with sleep apnea 01/06/2014  . Pure hypercholesterolemia 01/06/2014  . Type II diabetes mellitus with manifestations ([place]) 01/06/2014    Past Surgical History:  Procedure Laterality Date  . APPENDECTOMY    . AUGMENTATION MAMMAPLASTY Bilateral 1982/redo in 2014  . CARDIAC CATHETERIZATION N/A 04/23/2016   Procedure: Left Heart Cath and Coronary Angiography;  Surgeon: Corey Skains, MD;  Location: [place] [facility];  Service: Cardiovascular;  Laterality: N/A;  . CARDIAC CATHETERIZATION Left 04/23/2016   Procedure: Coronary Stent Intervention;  Surgeon: Yolonda Kida, MD;  Location: [place] [facility];  Service: Cardiovascular;  Laterality: Left;  . CAROTID ARTERY ANGIOPLASTY Left 2010   had stent inserted and removed d/t infection. artery from leg inserted in left carotid  . CHOLECYSTECTOMY    . CORONARY ANGIOPLASTY WITH  STENT PLACEMENT  september 9th 2017  . CYSTOSCOPY W/ URETERAL STENT PLACEMENT Left 08/17/2017   Procedure: CYSTOSCOPY WITH RETROGRADE PYELOGRAM/URETERAL STENT PLACEMENT;  Surgeon: Festus Aloe, MD;  Location: [facility];  Service: Urology;  Laterality: Left;  . CYSTOSCOPY W/ URETERAL STENT PLACEMENT Left 09/16/2017   Procedure:  CYSTOSCOPY WITH STENT REPLACEMENT;  Surgeon: Abbie Sons, MD;  Location: [facility];  Service: Urology;  Laterality: Left;  . CYSTOSCOPY/RETROGRADE/URETEROSCOPY/STONE EXTRACTION WITH BASKET Left 09/16/2017   Procedure: CYSTOSCOPY/RETROGRADE/URETEROSCOPY/STONE EXTRACTION WITH BASKET;  Surgeon: Abbie Sons, MD;  Location: [facility];  Service: Urology;  Laterality: Left;  . EYE SURGERY Bilateral    cataract extraction  . MASTECTOMY Right 1982  . PACEMAKER INSERTION Left 07/03/2016   Procedure: INSERTION PACEMAKER;  Surgeon: Isaias Cowman, MD;  Location: [facility];  Service: Cardiovascular;  Laterality: Left;  . SKIN CANCER EXCISION    . TUBAL LIGATION      Prior to Admission medications   Medication Sig Start Date End Date Taking? Authorizing Provider  apixaban (ELIQUIS) 5 MG TABS tablet Take 1 tablet (5 mg total) by mouth 2 (two) times daily. 06/16/16   Bettey Costa, MD  aspirin EC 81 MG tablet Take 81 mg by mouth daily.    [provider]  Cholecalciferol (VITAMIN D3) 2000 units TABS Take 2,000 Units by mouth daily.    [provider]  citalopram (CELEXA) 40 MG tablet TAKE ONE TABLET BY MOUTH EVERY NIGHT AT BEDTIME. 07/07/14   [provider]  diltiazem (CARDIZEM CD) 180 MG 24 hr capsule Take 1 capsule (180 mg total) by mouth daily. 06/16/16   Bettey Costa, MD  furosemide (LASIX) 40 MG tablet Take 40 mg by mouth daily.    [provider]  HUMALOG KWIKPEN 100 UNIT/ML KiwkPen Inject 12 Units into the skin 2 (two) times daily before a meal. *Use as directed per sliding scale* 03/03/15   [provider]  insulin glargine (LANTUS) 100 UNIT/ML injection Inject 0.42 mLs (42 Units total) into the skin at bedtime. Patient taking differently: Inject 50 Units into the skin at bedtime.  06/16/16   Bettey Costa, MD  levothyroxine (SYNTHROID, LEVOTHROID) 112 MCG tablet Take 1 tablet (112 mcg total) by mouth daily before breakfast. 06/17/16   Bettey Costa, MD   metFORMIN (GLUCOPHAGE-XR) 500 MG 24 hr tablet Take 2 tablets (1,000 mg total) by mouth 2 (two) times daily. Please start this on 8/24 Patient taking differently: Take 1,000 mg by mouth 2 (two) times daily. Pl 04/24/16   Bettey Costa, MD  mometasone-formoterol (DULERA) 200-5 MCG/ACT AERO Inhale 2 puffs into the lungs 2 (two) times daily. 09/23/17   Max Sane, MD  Multiple Vitamin (MULTIVITAMIN WITH MINERALS) TABS tablet Take 1 tablet by mouth daily. 09/24/17   Max Sane, MD  nitroGLYCERIN (NITROSTAT) 0.4 MG SL tablet Place 1 tablet (0.4 mg total) under the tongue every 5 (five) minutes as needed for chest pain. 04/24/16   Bettey Costa, MD  ropinirole (REQUIP) 5 MG tablet Take 5 mg by mouth at bedtime.  03/29/15   [provider]  rosuvastatin (CRESTOR) 10 MG tablet Take 10 mg by mouth at bedtime.  03/25/16   [provider]  tamsulosin (FLOMAX) 0.4 MG CAPS capsule Take 0.4 mg by mouth daily.  09/03/17 09/03/18  [provider]  tiotropium (SPIRIVA) 18 MCG inhalation capsule Place 1 capsule (18 mcg total) into inhaler and inhale every morning. 09/23/17   Max Sane, MD  trospium (SANCTURA) 20 MG tablet  Take 1 tablet (20 mg total) by mouth 2 (two) times daily as needed. For stent irritation 09/16/17   Stoioff, Ronda Fairly, MD    No Known Allergies  Family History  Problem Relation Age of Onset  . Prostate cancer Neg Hx   . Kidney cancer Neg Hx   . Breast cancer Neg Hx     Social History Social History   Tobacco Use  . Smoking status: Former Smoker    Packs/day: 0.50    Types: Cigarettes    Last attempt to quit: 09/02/1996    Years since quitting: 21.1  . Smokeless tobacco: Never Used  Substance Use Topics  . Alcohol use: Yes    Alcohol/week: 0.6 oz    Types: 1 Glasses of wine per week    Comment: occasionally  . Drug use: No    Review of Systems  Constitutional: Negative for fever. Eyes: Negative for visual changes. ENT: Negative for sore throat. Cardiovascular:  Negative for chest pain.  Syncope on Sunday as per HPI. Respiratory: Negative for shortness of breath. Gastrointestinal: Negative for abdominal pain, vomiting and diarrhea. Genitourinary: Negative for dysuria. Musculoskeletal: Positive as per HPI for back pain. Skin: Negative for rash. Neurological: Negative for headache.  ____________________________________________   PHYSICAL EXAM:  VITAL SIGNS: ED Triage Vitals [11/07/17 1158]  Enc Vitals Group     BP (!) 115/45     Pulse Rate 63     Resp 18     Temp (!) 97.4 F (36.3 C)     Temp Source Oral     SpO2 96 %     Weight 171 lb (77.6 kg)     Height 5\' 2"  (1.575 m)     Head Circumference      Peak Flow      Pain Score 10     Pain Loc      Pain Edu?      Excl. in [place]?      Constitutional: Alert and oriented. Well appearing and in no distress. HEENT   Head: Normocephalic and atraumatic.      Eyes: Conjunctivae are normal. Pupils equal and round.       Ears:         Nose: No congestion/rhinnorhea.   Mouth/Throat: Mucous membranes are moist.   Neck: No stridor. Cardiovascular/Chest: Normal rate, regular rhythm.  No murmurs, rubs, or gallops. Respiratory: Normal respiratory effort without tachypnea nor retractions. Breath sounds are clear and equal bilaterally. No wheezes/rales/rhonchi. Gastrointestinal: Soft. No distention, no guarding, no rebound. Nontender.    Genitourinary/rectal:Deferred Musculoskeletal: Low back pain left side lower near into the top of the pelvis.  Some pain with movement, but more so patient reports pain to that side when she bears weight.  No joint effusions.  No lower extremity tenderness.  No edema. Neurologic:  Normal speech and language. No gross or focal neurologic deficits are appreciated. Skin:  Skin is warm, dry and intact. No rash noted. Psychiatric: Mood and affect are normal. Speech and behavior are normal. Patient exhibits appropriate insight and  judgment.   ____________________________________________  LABS (pertinent positives/negatives) I, Lisa Roca, MD the attending physician have reviewed the labs noted below.  Labs Reviewed  TROPONIN I - Abnormal; Notable for the following components:      Result Value   Troponin I 1.39 (*)    All other components within normal limits  BASIC METABOLIC PANEL - Abnormal; Notable for the following components:   Sodium 134 (*)  Chloride 97 (*)    Glucose, Bld 236 (*)    BUN 24 (*)    Creatinine, Ser 1.19 (*)    Calcium 8.6 (*)    GFR calc non Af Amer 41 (*)    GFR calc Af Amer 48 (*)    All other components within normal limits  CBC - Abnormal; Notable for the following components:   Hemoglobin 10.5 (*)    HCT 32.8 (*)    MCV 73.0 (*)    MCH 23.3 (*)    RDW 18.1 (*)    All other components within normal limits    ____________________________________________    EKG I, Lisa Roca, MD, the attending physician have personally viewed and interpreted all ECGs.  63 bpm.  Atrially paced rhythm.  Left axis deviation.  Normal ST and T wave ____________________________________________  RADIOLOGY   Chest x-ray reviewed and interpreted by me: Negative Radiologist report:  IMPRESSION: Chronic changes without acute abnormality.  DG lumbar spine reviewed and interpreted by me: L1 compression Radiologist report:  IMPRESSION: Chronic L1 compression deformity.  Multilevel degenerative changes stable from the previous exam.  Left hip x-ray with pelvis reviewed and interpreted by me: No acute fracture seen Radiologist report:  IMPRESSION: No acute abnormality noted. __________________________________________  PROCEDURES  Procedure(s) performed: None  Critical Care performed: None   ____________________________________________  ED COURSE / ASSESSMENT AND PLAN  Pertinent labs & imaging results that were available during my care of the patient were reviewed by me and  considered in my medical decision making (see chart for details).   Patient came in for evaluation of several days of back pain after a fall on Sunday.  It sound like it is actually more of a syncope.  Laboratory studies were drawn by protocol at triage and came back elevated with her troponin above 1.  I spoke with the patient about obtaining chest x-ray as well as hospitalization given significantly abnormal troponin.  His EKG is overall reassuring.  BUN and creatinine are just slightly above baseline, however she does have a very significant CHF history, I am a little concerned about significant hydration.  We will give 250 cc bolus.  Do not think abnormal troponin is only from mild acute renal failure.  In terms of the back pain/left flank pain, uncertain etiology but at this point would still suspect musculoskeletal.  X-rays are reassuring for no acute finding.  There is a chronic lumbar compression fracture.  I spoke with Dr. Henrene Pastor shows, cardiology on-call regarding asymptomatic finding of troponin above 1.  He recommended in this setting, hold off for now in terms of any significant hand coagulation such as heparin, with hospitalist admission, repeat troponin and if elevated certainly pursue from there.   CONSULTATIONS:   Hospitalist for admission, discussed with Dr. Margaretmary Eddy.   Patient / Family / Caregiver informed of clinical course, medical decision-making process, and agree with plan.    ___________________________________________   FINAL CLINICAL IMPRESSION(S) / ED DIAGNOSES   Final diagnoses:  Troponin level elevated  Back pain, unspecified back location, unspecified back pain laterality, unspecified chronicity  Acute renal failure, unspecified acute renal failure type ([place])      ___________________________________________        Note: This dictation was prepared with Dragon dictation. Any transcriptional errors that result from this process are  unintentional    Lisa Roca, MD 11/07/17 1513

## 2017-11-07 NOTE — ED Notes (Signed)
Returned from x-ray, out ob bed to commode with minimal assistance

## 2017-11-07 NOTE — H&P (Signed)
[facility] [place] NAME: Cassandra [name]    MR#:  [id number]  DATE OF BIRTH:  21-May-1933  DATE OF ADMISSION:  11/07/2017  PRIMARY CARE PHYSICIAN: Idelle Crouch, MD   REQUESTING/REFERRING PHYSICIAN: Lord  CHIEF COMPLAINT:   Back pain and passed out on Sunday HISTORY OF PRESENT ILLNESS:  Cassandra [name]  is a 84 y.o. female with a known history of hypertension, coronary artery disease,Insulin requiring diabetes mellitus, hyperlipidemia, obstructive sleep apnea, history of breast cancer status post chemotherapy and multiple other medical problems is brought into the ED for worsening of low back pain.patient reports that she had a syncopal episode on last Sunday and landed on her back and head.  Since her last syncopal episode patient has been experiencing back pain which has been progressively getting worse and came into the ED today.  Lumbar spine x-ray has revealed chronic L1 compression fracture and no hip fractures were noticed.  During my examination patient is resting comfortably and denies any complaints.  Denies any chest pain.  Troponin is elevated at 1.39 EKG with no acute ST-T wave changes.  ED physician has discussed with the patient's primary cardiologist Dr. Ubaldo Glassing who has recommended overnight observation on telemetry monitor. PAST MEDICAL HISTORY:   Past Medical History:  Diagnosis Date  . Acquired hypothyroidism 01/06/2014  . Arthritis   . B12 deficiency 02/15/2014  . Benign essential hypertension 01/06/2014  . Breast cancer [facility]) 1982   Right breast cancer - chemotherapy  . CAD (coronary artery disease), native coronary artery 01/06/2014  . Carotid artery calcification   . Chronic airway obstruction, not elsewhere classified 01/06/2014  . Depression   . Diabetes mellitus without complication ([place])   . History of kidney stones 2019   left ureteral stone  . Myocardial infarction ([place]) 1982   small infarct  . Presence of  permanent cardiac pacemaker 07/2016  . Pure hypercholesterolemia 01/06/2014  . Skin cancer   . Sleep apnea    uses cpap    PAST SURGICAL HISTOIRY:   Past Surgical History:  Procedure Laterality Date  . APPENDECTOMY    . AUGMENTATION MAMMAPLASTY Bilateral 1982/redo in 2014  . CARDIAC CATHETERIZATION N/A 04/23/2016   Procedure: Left Heart Cath and Coronary Angiography;  Surgeon: Corey Skains, MD;  Location: [place] [facility];  Service: Cardiovascular;  Laterality: N/A;  . CARDIAC CATHETERIZATION Left 04/23/2016   Procedure: Coronary Stent Intervention;  Surgeon: Yolonda Kida, MD;  Location: [place] [facility];  Service: Cardiovascular;  Laterality: Left;  . CAROTID ARTERY ANGIOPLASTY Left 2010   had stent inserted and removed d/t infection. artery from leg inserted in left carotid  . CHOLECYSTECTOMY    . CORONARY ANGIOPLASTY WITH STENT PLACEMENT  september 9th 2017  . CYSTOSCOPY W/ URETERAL STENT PLACEMENT Left 08/17/2017   Procedure: CYSTOSCOPY WITH RETROGRADE PYELOGRAM/URETERAL STENT PLACEMENT;  Surgeon: Festus Aloe, MD;  Location: [facility];  Service: Urology;  Laterality: Left;  . CYSTOSCOPY W/ URETERAL STENT PLACEMENT Left 09/16/2017   Procedure: CYSTOSCOPY WITH STENT REPLACEMENT;  Surgeon: Abbie Sons, MD;  Location: [facility];  Service: Urology;  Laterality: Left;  . CYSTOSCOPY/RETROGRADE/URETEROSCOPY/STONE EXTRACTION WITH BASKET Left 09/16/2017   Procedure: CYSTOSCOPY/RETROGRADE/URETEROSCOPY/STONE EXTRACTION WITH BASKET;  Surgeon: Abbie Sons, MD;  Location: [facility];  Service: Urology;  Laterality: Left;  . EYE SURGERY Bilateral    cataract extraction  . MASTECTOMY Right 1982  . PACEMAKER INSERTION Left 07/03/2016   Procedure: INSERTION PACEMAKER;  Surgeon: Isaias Cowman, MD;  Location: [facility];  Service: Cardiovascular;  Laterality: Left;  . SKIN CANCER EXCISION    . TUBAL LIGATION      SOCIAL HISTORY:   Social History   Tobacco Use  .  Smoking status: Former Smoker    Packs/day: 0.50    Types: Cigarettes    Last attempt to quit: 09/02/1996    Years since quitting: 21.1  . Smokeless tobacco: Never Used  Substance Use Topics  . Alcohol use: Yes    Alcohol/week: 0.6 oz    Types: 1 Glasses of wine per week    Comment: occasionally    FAMILY HISTORY:   Family History  Problem Relation Age of Onset  . Prostate cancer Neg Hx   . Kidney cancer Neg Hx   . Breast cancer Neg Hx     DRUG ALLERGIES:  No Known Allergies  REVIEW OF SYSTEMS:  CONSTITUTIONAL: No fever, fatigue or weakness.  EYES: No blurred or double vision.  EARS, NOSE, AND THROAT: No tinnitus or ear pain.  RESPIRATORY: No cough, shortness of breath, wheezing or hemoptysis.  CARDIOVASCULAR: No chest pain, orthopnea, edema.  GASTROINTESTINAL: No nausea, vomiting, diarrhea or abdominal pain.  GENITOURINARY: No dysuria, hematuria.  ENDOCRINE: No polyuria, nocturia,  HEMATOLOGY: No anemia, easy bruising or bleeding SKIN: No rash or lesion. MUSCULOSKELETAL:  reporting chronic low back pain which is getting worse NEUROLOGIC: No tingling, numbness, weakness.  PSYCHIATRY: No anxiety or depression.   MEDICATIONS AT HOME:   Prior to Admission medications   Medication Sig Start Date End Date Taking? Authorizing Provider  apixaban (ELIQUIS) 5 MG TABS tablet Take 1 tablet (5 mg total) by mouth 2 (two) times daily. 06/16/16   Bettey Costa, MD  aspirin EC 81 MG tablet Take 81 mg by mouth daily.    [provider]  Cholecalciferol (VITAMIN D3) 2000 units TABS Take 2,000 Units by mouth daily.    [provider]  citalopram (CELEXA) 40 MG tablet TAKE ONE TABLET BY MOUTH EVERY NIGHT AT BEDTIME. 07/07/14   [provider]  diltiazem (CARDIZEM CD) 180 MG 24 hr capsule Take 1 capsule (180 mg total) by mouth daily. 06/16/16   Bettey Costa, MD  furosemide (LASIX) 40 MG tablet Take 40 mg by mouth daily.    [provider]  HUMALOG KWIKPEN  100 UNIT/ML KiwkPen Inject 12 Units into the skin 2 (two) times daily before a meal. *Use as directed per sliding scale* 03/03/15   [provider]  insulin glargine (LANTUS) 100 UNIT/ML injection Inject 0.42 mLs (42 Units total) into the skin at bedtime. Patient taking differently: Inject 50 Units into the skin at bedtime.  06/16/16   Bettey Costa, MD  levothyroxine (SYNTHROID, LEVOTHROID) 112 MCG tablet Take 1 tablet (112 mcg total) by mouth daily before breakfast. 06/17/16   Bettey Costa, MD  metFORMIN (GLUCOPHAGE-XR) 500 MG 24 hr tablet Take 2 tablets (1,000 mg total) by mouth 2 (two) times daily. Please start this on 8/24 Patient taking differently: Take 1,000 mg by mouth 2 (two) times daily. Pl 04/24/16   Bettey Costa, MD  mometasone-formoterol (DULERA) 200-5 MCG/ACT AERO Inhale 2 puffs into the lungs 2 (two) times daily. 09/23/17   Max Sane, MD  Multiple Vitamin (MULTIVITAMIN WITH MINERALS) TABS tablet Take 1 tablet by mouth daily. 09/24/17   Max Sane, MD  nitroGLYCERIN (NITROSTAT) 0.4 MG SL tablet Place 1 tablet (0.4 mg total) under the tongue every 5 (five) minutes as needed for  chest pain. 04/24/16   Bettey Costa, MD  ropinirole (REQUIP) 5 MG tablet Take 5 mg by mouth at bedtime.  03/29/15   [provider]  rosuvastatin (CRESTOR) 10 MG tablet Take 10 mg by mouth at bedtime.  03/25/16   [provider]  tamsulosin (FLOMAX) 0.4 MG CAPS capsule Take 0.4 mg by mouth daily.  09/03/17 09/03/18  [provider]  tiotropium (SPIRIVA) 18 MCG inhalation capsule Place 1 capsule (18 mcg total) into inhaler and inhale every morning. 09/23/17   Max Sane, MD  trospium (SANCTURA) 20 MG tablet Take 1 tablet (20 mg total) by mouth 2 (two) times daily as needed. For stent irritation 09/16/17   Stoioff, Ronda Fairly, MD      VITAL SIGNS:  Blood pressure 121/66, pulse 64, temperature (!) 97.4 F (36.3 C), temperature source Oral, resp. rate 18, height 5\' 2"  (1.575 m), weight 77.6 kg  (171 lb), SpO2 93 %.  PHYSICAL EXAMINATION:  GENERAL:  84 y.o.-year-old patient lying in the bed with no acute distress.  EYES: Pupils equal, round, reactive to light and accommodation. No scleral icterus. Extraocular muscles intact.  HEENT: Head atraumatic, normocephalic. Oropharynx and nasopharynx clear.  NECK:  Supple, no jugular venous distention. No thyroid enlargement, no tenderness.  LUNGS: Normal breath sounds bilaterally, no wheezing, rales,rhonchi or crepitation. No use of accessory muscles of respiration.  CARDIOVASCULAR: S1, S2 normal. No murmurs, rubs, or gallops.  ABDOMEN: Soft, nontender, nondistended. Bowel sounds present. No organomegaly or mass.  EXTREMITIES: No pedal edema, cyanosis, or clubbing. diffuse  lower back tenderness NEUROLOGIC: Cranial nerves II through XII are intact.  Sensation intact. Gait not checked.  PSYCHIATRIC: The patient is alert and oriented x 3.  SKIN: No obvious rash, lesion, or ulcer.   LABORATORY PANEL:   CBC Recent Labs  Lab 11/07/17 1216  WBC 10.8  HGB 10.5*  HCT 32.8*  PLT 312   ------------------------------------------------------------------------------------------------------------------  Chemistries  Recent Labs  Lab 11/07/17 1216  NA 134*  K 3.6  CL 97*  CO2 26  GLUCOSE 236*  BUN 24*  CREATININE 1.19*  CALCIUM 8.6*   ------------------------------------------------------------------------------------------------------------------  Cardiac Enzymes Recent Labs  Lab 11/07/17 1216  TROPONINI 1.39*   ------------------------------------------------------------------------------------------------------------------  RADIOLOGY:  Dg Lumbar Spine 2-3 Views  Result Date: 11/07/2017 CLINICAL DATA:  Recent fall with low back pain EXAM: LUMBAR SPINE - 3 VIEW COMPARISON:  08/17/2017. FINDINGS: Five lumbar type vertebral bodies are well visualized. Vertebral body height is well maintained with the exception of inferior  endplate compression deformity at L1 which is stable from the prior CT examination. No new compression deformity is seen. Disc space narrowing is noted at L2-3 and L3-4 as well as L4-5. Mild osteophytic changes are noted. Pelvic ring is intact. No soft tissue abnormality is noted. Aortic calcifications are seen. IMPRESSION: Chronic L1 compression deformity. Multilevel degenerative changes stable from the previous exam. Electronically Signed   By: Inez Catalina M.D.   On: 11/07/2017 15:02   Dg Chest Port 1 View  Result Date: 11/07/2017 CLINICAL DATA:  Recent fall EXAM: PORTABLE CHEST 1 VIEW COMPARISON:  09/21/2017 FINDINGS: Cardiac shadow is mildly enlarged but stable. Pacing device is again seen. Aortic calcifications are again noted. Mild interstitial changes are again seen bilaterally. No focal infiltrate or sizable effusion is noted. No bony abnormality is seen. IMPRESSION: Chronic changes without acute abnormality. Electronically Signed   By: Inez Catalina M.D.   On: 11/07/2017 14:11   Dg Hip Unilat W Or Wo  Pelvis 2-3 Views Left  Result Date: 11/07/2017 CLINICAL DATA:  Recent fall left hip pain, initial encounter EXAM: DG HIP (WITH OR WITHOUT PELVIS) 3V LEFT COMPARISON:  None. FINDINGS: Pelvic ring is intact. Degenerative changes of the hip joints are noted bilaterally. No acute fracture or dislocation is noted. Degenerative changes of the lumbar spine are seen. IMPRESSION: No acute abnormality noted. Electronically Signed   By: Inez Catalina M.D.   On: 11/07/2017 15:05    EKG:   Orders placed or performed during the hospital encounter of 11/07/17  . ED EKG  . ED EKG  . EKG 12-Lead  . EKG 12-Lead    IMPRESSION AND PLAN:   Cassandra [name]  is a 84 y.o. female with a known history of hypertension, coronary artery disease,Insulin requiring diabetes mellitus, hyperlipidemia, obstructive sleep apnea, history of breast cancer status post chemotherapy and multiple other medical problems is brought  into the ED for worsening of low back pain.patient reports that she had a syncopal episode on last Sunday and landed on her back and head.  Since her last syncopal episode patient has been experiencing back pain which has been progressively getting worse and came into the ED today.  Lumbar spine x-ray has revealed chronic L1 compression fracture and no hip fractures were noticed.  During my examination patient is resting comfortably and denies any complaints   #Acute on chronic low back pain-secondary to fall X-ray with no acute fractures Pain management as needed PT evaluation    #syncope could be cardiogenic-or  vasovagal or orthostatic Patient sustained a fall on last Sunday  monitor patient on telemetry Blood pressure is soft, will check orthostatic blood pressures Holding Lasix and Cardizem CD at this time Cycle cardiac biomarkers Echocardiogram Cardiology consult is placed Has chronic history of carotid artery calcification we will get carotid ultrasound  #Elevated troponin Monitor patient on telemetry cycle cardiac biomarkers ED physician Dr. Reita Cliche has discussed with Dr. Ubaldo Glassing who has recommended close monitoring and overnight observation on telemetry at this point but not considering heparin drip as patient is asymptomatic  #Chronic history of atrial flutter Continue Eliquis Holding Cardizem CD in view of hypotension at this time Holding patient's home medication Lasix also patient denies any history of congestive heart failure   #Restless leg syndrome continue Requip  #nsulin requiring diabetes mellitus  sliding scale insulin Patient takes 52 units of Lantus at bedtime and 12 units of Humalog a.m. and at bedtime at home.  Will start Lantus at a lower dose for basal coverage    All the records are reviewed and case discussed with ED provider. Management plans discussed with the patient, family and they are in agreement.  CODE STATUS: FC  TOTAL TIME TAKING CARE OF THIS  PATIENT: 43  minutes.   Note: This dictation was prepared with Dragon dictation along with smaller phrase technology. Any transcriptional errors that result from this process are unintentional.  Nicholes Mango M.D on 11/07/2017 at 4:01 PM  Between 7am to 6pm - Pager - [phone]  After 6pm go to [email or web address] - password [email or web address] [facility]  Office  [phone]  CC: Primary care physician; Idelle Crouch, MD

## 2017-11-07 NOTE — Progress Notes (Signed)
Family Meeting Note  Advance Directive:yes  Today a meeting took place with the Patient, Spouse at bedside     The following clinical team members were present during this meeting:MD  The following were discussed:Patient's diagnosis: Syncope, elevated troponin, chronic congestive heart failure, restless leg syndrome, chronic low back pain, management and plan of care discussed in detail with the patient and her husband at bedside  patient's progosis: Unable to determine and Goals for treatment: Full code, husband is the healthcare power of attorney  Additional follow-up to be provided: Hospitalist and [facility] cardiologist  Time spent during discussion:18 MIN   Nicholes Mango, MD

## 2017-11-07 NOTE — ED Notes (Addendum)
Pt has used cane this week to assist with walking. Does not use cane or walker at baseline. No pain with sitting. Pain with standing to left flank area, denies pain radiating down to hip.  Alert and oriented X 4.

## 2017-11-07 NOTE — Progress Notes (Signed)
Repeat troponin at same level (1.39). Only notify MD if level increases per Dr. Margaretmary Eddy. Patient resting quietly, no chest pain. Will continue to monitor.

## 2017-11-08 ENCOUNTER — Observation Stay
Admit: 2017-11-08 | Discharge: 2017-11-08 | Disposition: A | Discharge status: Home / Self Care | Payer: Medicare Other | Attending: Internal Medicine | Admitting: Internal Medicine

## 2017-11-08 LAB — LIPID PANEL
CHOL/HDL RATIO: 3.1 ratio
Cholesterol: 112 mg/dL (ref 0–200)
HDL: 36 mg/dL — AB (ref >40)
LDL CALC: 57 mg/dL (ref 0–99)
TRIGLYCERIDES: 93 mg/dL (ref <150)
VLDL: 19 mg/dL (ref 0–40)

## 2017-11-08 LAB — BASIC METABOLIC PANEL
Anion gap: 9 (ref 5–15)
BUN: 23 mg/dL — ABNORMAL HIGH (ref 6–20)
CALCIUM: 8.4 mg/dL — AB (ref 8.9–10.3)
CO2: 30 mmol/L (ref 22–32)
Chloride: 100 mmol/L — ABNORMAL LOW (ref 101–111)
Creatinine, Ser: 0.98 mg/dL (ref 0.44–1.00)
GFR calc Af Amer: >60 mL/min (ref >60)
GFR, EST NON AFRICAN AMERICAN: 52 mL/min — AB (ref >60)
GLUCOSE: 175 mg/dL — AB (ref 65–99)
POTASSIUM: 3.3 mmol/L — AB (ref 3.5–5.1)
Sodium: 139 mmol/L (ref 135–145)

## 2017-11-08 LAB — GLUCOSE, CAPILLARY
GLUCOSE-CAPILLARY: 218 mg/dL — AB (ref 65–99)
Glucose-Capillary: 230 mg/dL — ABNORMAL HIGH (ref 65–99)

## 2017-11-08 LAB — ECHOCARDIOGRAM COMPLETE
Height: 62 in
Weight: 2832 oz

## 2017-11-08 LAB — TSH: TSH: 1.196 u[IU]/mL (ref 0.350–4.500)

## 2017-11-08 LAB — TROPONIN I: Troponin I: 1.2 ng/mL (ref <0.03)

## 2017-11-08 MED ORDER — METOPROLOL SUCCINATE ER 50 MG PO TB24: 50.0000 mg | ORAL_TABLET | Freq: Every day | ORAL | 0 refills | Status: DC

## 2017-11-08 MED ORDER — DOCUSATE SODIUM 100 MG PO CAPS: 100.0000 mg | ORAL_CAPSULE | Freq: Two times a day (BID) | ORAL | 0 refills | Status: DC | PRN

## 2017-11-08 MED ORDER — POTASSIUM CHLORIDE CRYS ER 20 MEQ PO TBCR
40.0000 meq | EXTENDED_RELEASE_TABLET | Freq: Once | ORAL | Status: AC
  Administered 2017-11-08: 40 meq via ORAL
  Filled 2017-11-08: qty 2

## 2017-11-08 MED ORDER — METOPROLOL SUCCINATE ER 50 MG PO TB24
50.0000 mg | ORAL_TABLET | Freq: Every day | ORAL | Status: DC
  Administered 2017-11-08: 50 mg via ORAL
  Filled 2017-11-08: qty 1

## 2017-11-08 MED ORDER — FUROSEMIDE 20 MG PO TABS: 20.0000 mg | ORAL_TABLET | Freq: Every day | ORAL | 1 refills | Status: DC

## 2017-11-08 NOTE — Discharge Instructions (Signed)
Follow-up with primary care physician in 5 days Follow-up with Dr. Ubaldo Glassing in a week for outpatient Holter monitoring

## 2017-11-08 NOTE — Progress Notes (Signed)
PT Cancellation Note  Patient Details Name: AYAN YANKEY MRN: [id number] DOB: [birth year]   Cancelled Treatment:    Reason Eval/Treat Not Completed: Medical issues which prohibited therapy.  Elevating troponins and will ck later to see how pt is progressing.   Ramond Dial 11/08/2017, 9:21 AM   Mee Hives, PT MS Acute Rehab Dept. Number: [place] and [place]

## 2017-11-08 NOTE — Consult Note (Signed)
Cardiology Consultation Note    Patient ID: Cassandra [name], MRN: [id number], DOB/AGE: September 20, 1932 84 y.o. Admit date: 11/07/2017   Date of Consult: 11/08/2017 Primary Physician: Idelle Crouch, MD Primary Cardiologist: Dr. Ubaldo Glassing  Chief Complaint: dizzy Reason for Consultation: elevated troponin Requesting MD: Dr. Margaretmary Eddy  HPI: Cassandra [name] is a 84 y.o. female with history of chronic dizziness, cad s/p pci, hypertension, afib , high grade heart block s/p ppm who was admitted after presenting to the er with complaints of dizziness. She had no chest pain. Her serum troponin was mildly elevated at 1.39. Subsequent values were 1.37 and 1.20. Her baseline troponin is 0.60.  Earlier in the week she suffered a fall while standing in her bathroom. She fell on her buttocks and had some mild head trauma. She did not seek medical attention. Her ekg on presentation showed atrial paced and ventricular sensed with no ischemia. Sine admission she has been relatively hypotensive with sbp 110-120. No arrhythmia. No syncope. Had mild dizziness when standing up. CXR showed no pulmonary edema. Echo showed preserved lv function with no wall motion abnormality.   Past Medical History:  Diagnosis Date  . Acquired hypothyroidism 01/06/2014  . Arthritis   . B12 deficiency 02/15/2014  . Benign essential hypertension 01/06/2014  . Breast cancer [facility]) 1982   Right breast cancer - chemotherapy  . CAD (coronary artery disease), native coronary artery 01/06/2014  . Carotid artery calcification   . Chronic airway obstruction, not elsewhere classified 01/06/2014  . Depression   . Diabetes mellitus without complication ([place])   . History of kidney stones 2019   left ureteral stone  . Myocardial infarction ([place]) 1982   small infarct  . Presence of permanent cardiac pacemaker 07/2016  . Pure hypercholesterolemia 01/06/2014  . Skin cancer   . Sleep apnea    uses cpap      Surgical History:  Past Surgical History:   Procedure Laterality Date  . APPENDECTOMY    . AUGMENTATION MAMMAPLASTY Bilateral 1982/redo in 2014  . CARDIAC CATHETERIZATION N/A 04/23/2016   Procedure: Left Heart Cath and Coronary Angiography;  Surgeon: Corey Skains, MD;  Location: [place] [facility];  Service: Cardiovascular;  Laterality: N/A;  . CARDIAC CATHETERIZATION Left 04/23/2016   Procedure: Coronary Stent Intervention;  Surgeon: Yolonda Kida, MD;  Location: [place] [facility];  Service: Cardiovascular;  Laterality: Left;  . CAROTID ARTERY ANGIOPLASTY Left 2010   had stent inserted and removed d/t infection. artery from leg inserted in left carotid  . CHOLECYSTECTOMY    . CORONARY ANGIOPLASTY WITH STENT PLACEMENT  september 9th 2017  . CYSTOSCOPY W/ URETERAL STENT PLACEMENT Left 08/17/2017   Procedure: CYSTOSCOPY WITH RETROGRADE PYELOGRAM/URETERAL STENT PLACEMENT;  Surgeon: Festus Aloe, MD;  Location: [facility];  Service: Urology;  Laterality: Left;  . CYSTOSCOPY W/ URETERAL STENT PLACEMENT Left 09/16/2017   Procedure: CYSTOSCOPY WITH STENT REPLACEMENT;  Surgeon: Abbie Sons, MD;  Location: [facility];  Service: Urology;  Laterality: Left;  . CYSTOSCOPY/RETROGRADE/URETEROSCOPY/STONE EXTRACTION WITH BASKET Left 09/16/2017   Procedure: CYSTOSCOPY/RETROGRADE/URETEROSCOPY/STONE EXTRACTION WITH BASKET;  Surgeon: Abbie Sons, MD;  Location: [facility];  Service: Urology;  Laterality: Left;  . EYE SURGERY Bilateral    cataract extraction  . MASTECTOMY Right 1982  . PACEMAKER INSERTION Left 07/03/2016   Procedure: INSERTION PACEMAKER;  Surgeon: Isaias Cowman, MD;  Location: [facility];  Service: Cardiovascular;  Laterality: Left;  . SKIN CANCER EXCISION    . TUBAL LIGATION  Home Meds: Prior to Admission medications   Medication Sig Start Date End Date Taking? Authorizing Provider  apixaban (ELIQUIS) 5 MG TABS tablet Take 1 tablet (5 mg total) by mouth 2 (two) times daily. 06/16/16   Bettey Costa, MD   aspirin EC 81 MG tablet Take 81 mg by mouth daily.    [provider]  Cholecalciferol (VITAMIN D3) 2000 units TABS Take 2,000 Units by mouth daily.    [provider]  citalopram (CELEXA) 40 MG tablet TAKE ONE TABLET BY MOUTH EVERY NIGHT AT BEDTIME. 07/07/14   [provider]  diltiazem (CARDIZEM CD) 180 MG 24 hr capsule Take 1 capsule (180 mg total) by mouth daily. 06/16/16   Bettey Costa, MD  furosemide (LASIX) 40 MG tablet Take 40 mg by mouth daily.    [provider]  HUMALOG KWIKPEN 100 UNIT/ML KiwkPen Inject 12 Units into the skin 2 (two) times daily before a meal. *Use as directed per sliding scale* 03/03/15   [provider]  insulin glargine (LANTUS) 100 UNIT/ML injection Inject 0.42 mLs (42 Units total) into the skin at bedtime. Patient taking differently: Inject 50 Units into the skin at bedtime.  06/16/16   Bettey Costa, MD  levothyroxine (SYNTHROID, LEVOTHROID) 112 MCG tablet Take 1 tablet (112 mcg total) by mouth daily before breakfast. 06/17/16   Bettey Costa, MD  metFORMIN (GLUCOPHAGE-XR) 500 MG 24 hr tablet Take 2 tablets (1,000 mg total) by mouth 2 (two) times daily. Please start this on 8/24 Patient taking differently: Take 1,000 mg by mouth 2 (two) times daily. Pl 04/24/16   Bettey Costa, MD  mometasone-formoterol (DULERA) 200-5 MCG/ACT AERO Inhale 2 puffs into the lungs 2 (two) times daily. 09/23/17   Max Sane, MD  Multiple Vitamin (MULTIVITAMIN WITH MINERALS) TABS tablet Take 1 tablet by mouth daily. 09/24/17   Max Sane, MD  nitroGLYCERIN (NITROSTAT) 0.4 MG SL tablet Place 1 tablet (0.4 mg total) under the tongue every 5 (five) minutes as needed for chest pain. 04/24/16   Bettey Costa, MD  ropinirole (REQUIP) 5 MG tablet Take 5 mg by mouth at bedtime.  03/29/15   [provider]  rosuvastatin (CRESTOR) 10 MG tablet Take 10 mg by mouth at bedtime.  03/25/16   [provider]  tamsulosin (FLOMAX) 0.4 MG CAPS capsule Take  0.4 mg by mouth daily.  09/03/17 09/03/18  [provider]  tiotropium (SPIRIVA) 18 MCG inhalation capsule Place 1 capsule (18 mcg total) into inhaler and inhale every morning. 09/23/17   Max Sane, MD  trospium (SANCTURA) 20 MG tablet Take 1 tablet (20 mg total) by mouth 2 (two) times daily as needed. For stent irritation 09/16/17   Stoioff, Ronda Fairly, MD    Inpatient Medications:  . apixaban  5 mg Oral BID  . aspirin EC  81 mg Oral Daily  . cholecalciferol  2,000 Units Oral Daily  . citalopram  40 mg Oral QHS  . insulin aspart  0-5 Units Subcutaneous QHS  . insulin aspart  0-9 Units Subcutaneous TID WC  . insulin glargine  20 Units Subcutaneous QHS  . levothyroxine  112 mcg Oral QAC breakfast  . metoprolol succinate  50 mg Oral Daily  . mometasone-formoterol  2 puff Inhalation BID  . multivitamin with minerals  1 tablet Oral Daily  . ropinirole  5 mg Oral QHS  . rosuvastatin  10 mg Oral QHS  . sodium chloride flush  3 mL Intravenous Q12H  . tiotropium  18  mcg Inhalation q morning - 10a     Allergies: No Known Allergies  Social History   Socioeconomic History  . Marital status: Married    Spouse name: Not on file  . Number of children: Not on file  . Years of education: Not on file  . Highest education level: Not on file  Social Needs  . Financial resource strain: Not on file  . Food insecurity - worry: Not on file  . Food insecurity - inability: Not on file  . Transportation needs - medical: Not on file  . Transportation needs - non-medical: Not on file  Occupational History  . Not on file  Tobacco Use  . Smoking status: Former Smoker    Packs/day: 0.50    Types: Cigarettes    Last attempt to quit: 09/02/1996    Years since quitting: 21.1  . Smokeless tobacco: Never Used  Substance and Sexual Activity  . Alcohol use: Yes    Alcohol/week: 0.6 oz    Types: 1 Glasses of wine per week    Comment: occasionally  . Drug use: No  . Sexual activity: Not on file   Other Topics Concern  . Not on file  Social History Narrative  . Not on file     Family History  Problem Relation Age of Onset  . Prostate cancer Neg Hx   . Kidney cancer Neg Hx   . Breast cancer Neg Hx      Review of Systems: A 12-system review of systems was performed and is negative except as noted in the HPI.  Labs: Recent Labs    11/07/17 1216 11/07/17 1714 11/07/17 2323 11/08/17 0509  TROPONINI 1.39* 1.39* 1.37* 1.20*   Lab Results  Component Value Date   WBC 10.8 11/07/2017   HGB 10.5 (L) 11/07/2017   HCT 32.8 (L) 11/07/2017   MCV 73.0 (L) 11/07/2017   PLT 312 11/07/2017    Recent Labs  Lab 11/08/17 0509  NA 139  K 3.3*  CL 100*  CO2 30  BUN 23*  CREATININE 0.98  CALCIUM 8.4*  GLUCOSE 175*   Lab Results  Component Value Date   CHOL 112 11/08/2017   HDL 36 (L) 11/08/2017   LDLCALC 57 11/08/2017   TRIG 93 11/08/2017   No results found for: DDIMER  Radiology/Studies:  Dg Lumbar Spine 2-3 Views  Result Date: 11/07/2017 CLINICAL DATA:  Recent fall with low back pain EXAM: LUMBAR SPINE - 3 VIEW COMPARISON:  08/17/2017. FINDINGS: Five lumbar type vertebral bodies are well visualized. Vertebral body height is well maintained with the exception of inferior endplate compression deformity at L1 which is stable from the prior CT examination. No new compression deformity is seen. Disc space narrowing is noted at L2-3 and L3-4 as well as L4-5. Mild osteophytic changes are noted. Pelvic ring is intact. No soft tissue abnormality is noted. Aortic calcifications are seen. IMPRESSION: Chronic L1 compression deformity. Multilevel degenerative changes stable from the previous exam. Electronically Signed   By: Inez Catalina M.D.   On: 11/07/2017 15:02   Dg Chest Port 1 View  Result Date: 11/07/2017 CLINICAL DATA:  Recent fall EXAM: PORTABLE CHEST 1 VIEW COMPARISON:  09/21/2017 FINDINGS: Cardiac shadow is mildly enlarged but stable. Pacing device is again seen. Aortic  calcifications are again noted. Mild interstitial changes are again seen bilaterally. No focal infiltrate or sizable effusion is noted. No bony abnormality is seen. IMPRESSION: Chronic changes without acute abnormality. Electronically Signed   By: Elta Guadeloupe  Lukens M.D.   On: 11/07/2017 14:11   Dg Hip Unilat W Or Wo Pelvis 2-3 Views Left  Result Date: 11/07/2017 CLINICAL DATA:  Recent fall left hip pain, initial encounter EXAM: DG HIP (WITH OR WITHOUT PELVIS) 3V LEFT COMPARISON:  None. FINDINGS: Pelvic ring is intact. Degenerative changes of the hip joints are noted bilaterally. No acute fracture or dislocation is noted. Degenerative changes of the lumbar spine are seen. IMPRESSION: No acute abnormality noted. Electronically Signed   By: Inez Catalina M.D.   On: 11/07/2017 15:05    Wt Readings from Last 3 Encounters:  11/08/17 80.3 kg (177 lb)  09/30/17 80.3 kg (177 lb)  09/16/17 75.8 kg (167 lb)    EKG: atrial paced with ventricular sensed.   Physical Exam:  Blood pressure (!) 127/46, pulse 61, temperature 98.1 F (36.7 C), temperature source Oral, resp. rate 18, height 5\' 2"  (1.575 m), weight 80.3 kg (177 lb), SpO2 93 %. Body mass index is 32.37 kg/m. General: Well developed, well nourished, in no acute distress. Head: Normocephalic, atraumatic, sclera non-icteric, no xanthomas, nares are without discharge.  Neck: Negative for carotid bruits. JVD not elevated. Lungs: Clear bilaterally to auscultation without wheezes, rales, or rhonchi. Breathing is unlabored. Heart: RRR with S1 S2. No murmurs, rubs, or gallops appreciated. Abdomen: Soft, non-tender, non-distended with normoactive bowel sounds. No hepatomegaly. No rebound/guarding. No obvious abdominal masses. Msk:  Strength and tone appear normal for age. Extremities: No clubbing or cyanosis. No edema.  Distal pedal pulses are 2+ and equal bilaterally. Neuro: Alert and oriented X 3. No facial asymmetry. No focal deficit. Moves all extremities  spontaneously. Psych:  Responds to questions appropriately with a normal affect.     Assessment and Plan  84 yo with history of afib treated with cardizem 120 mg daily and metoprolol succinate 50 mg daily and anticoagulated with apixiban. She has high grade heart block and has a functioning dual chamber pacemaker. Her serum tro[pnin elevation does not appear to be due to acs. Likely demand ischemia. Etiology of her fall is unclear but appears to be due to relative hypotension with orthostasis. Will not proceed with invasive evaluation at present. Would continue with apixiban and will resume metoprolol succinate at 50 mg daily. Will remain off of diltiazem for now. Will also discontinue flomax. Would ambulate and if stable consider discharge to home. Will see next week as outpatient for consideration for holter.   Signed, Teodoro Spray MD 11/08/2017, 11:42 AM Pager: [phone]

## 2017-11-08 NOTE — Plan of Care (Signed)
Medications, pain relief techniques with hot and cold. Discharge instructions. AVS material.

## 2017-11-08 NOTE — Discharge Summary (Signed)
[facility] NAME: Cassandra [name]    MR#:  [id number]  DATE OF BIRTH:  1933-07-13  DATE OF ADMISSION:  11/07/2017 ADMITTING PHYSICIAN: Nicholes Mango, MD  DATE OF DISCHARGE: 11/08/17  PRIMARY CARE PHYSICIAN: Idelle Crouch, MD    ADMISSION DIAGNOSIS:  Troponin level elevated [R74.8] Acute renal failure, unspecified acute renal failure type ([place]) [N17.9] Back pain, unspecified back location, unspecified back pain laterality, unspecified chronicity [M54.9]  DISCHARGE DIAGNOSIS:  Active Problems:   Syncope Elevated troponin  SECONDARY DIAGNOSIS:   Past Medical History:  Diagnosis Date  . Acquired hypothyroidism 01/06/2014  . Arthritis   . B12 deficiency 02/15/2014  . Benign essential hypertension 01/06/2014  . Breast cancer [facility]) 1982   Right breast cancer - chemotherapy  . CAD (coronary artery disease), native coronary artery 01/06/2014  . Carotid artery calcification   . Chronic airway obstruction, not elsewhere classified 01/06/2014  . Depression   . Diabetes mellitus without complication ([place])   . History of kidney stones 2019   left ureteral stone  . Myocardial infarction ([place]) 1982   small infarct  . Presence of permanent cardiac pacemaker 07/2016  . Pure hypercholesterolemia 01/06/2014  . Skin cancer   . Sleep apnea    uses cpap    HOSPITAL COURSE:   HPI Cassandra [name]  is a 84 y.o. female with a known history of hypertension, coronary artery disease,Insulin requiring diabetes mellitus, hyperlipidemia, obstructive sleep apnea, history of breast cancer status post chemotherapy and multiple other medical problems is brought into the ED for worsening of low back pain.patient reports that she had a syncopal episode on last Sunday and landed on her back and head.  Since her last syncopal episode patient has been experiencing back pain which has been progressively getting worse and came into the ED today.  Lumbar spine  x-ray has revealed chronic L1 compression fracture and no hip fractures were noticed.  During my examination patient is resting comfortably and denies any complaints.  Denies any chest pain.  Troponin is elevated at 1.39 EKG with no acute ST-T wave changes.  ED physician has discussed with the patient's primary cardiologist Dr. Ubaldo Glassing who has recommended overnight observation on telemetry monitor.   #Acute on chronic low back pain-secondary to fall X-ray with no acute fractures Pain management as needed PT evaluation    #syncope could be cardiogenic-or  vasovagal or orthostatic Patient sustained a fall on last Sunday  monitored patient on telemetry.  Evaluated by cardiology Dr. Ubaldo Glassing who is recommending outpatient Holter monitoring Blood pressure is soft,  discontinued Cardizem CD and patient is started on metoprolol 50 mg  Lasix dose decreased to 20 mg Troponin I.37-1.20 TSH is normal Echocardiogram done results are pending.  Cardiology to follow-up on the results Cardiology  recommendations appreciated.  Outpatient follow-up with cardiology Dr. Ubaldo Glassing has chronic history of carotid artery calcification-PCP to consider repeating carotid ultrasound  #Elevated troponin-probably from demand ischemia Patient is a symptomatic with no chest pain or shortness of breath Monitored patient on telemetry-patient has high-grade heart block and has functioning dual-chamber pacemaker  Troponins 1.37-1.20 LDL 57 Okay to discharge patient from cardiology standpoint and outpatient follow-up with cardiology in 1 week and considering  Holter monitoring  #Chronic history of atrial flutter Continue Eliquis D/c Cardizem CD in view of hypotension at this time patient's Lasix was held during the hospital course and will continue to hold until patient is seen and evaluated by  primary care physician, patient denies any history of congestive heart failure   #Restless leg syndrome continue Requip  #nsulin  requiring diabetes mellitus  sliding scale insulin Patient takes 52 units of Lantus at bedtime and 12 units of Humalog a.m. and at bedtime at home.   Will discharge patient home with 42 units of Lantus and to continue Humalog at mealtime  PT consult  DISCHARGE CONDITIONS:   fair  CONSULTS OBTAINED:  Treatment Team:  Teodoro Spray, MD   PROCEDURES  None   DRUG ALLERGIES:  No Known Allergies  DISCHARGE MEDICATIONS:   Allergies as of 11/08/2017   No Known Allergies     Medication List    STOP taking these medications   diltiazem 180 MG 24 hr capsule Commonly known as:  CARDIZEM CD   furosemide 40 MG tablet Commonly known as:  LASIX   tamsulosin 0.4 MG Caps capsule Commonly known as:  FLOMAX     TAKE these medications   apixaban 5 MG Tabs tablet Commonly known as:  ELIQUIS Take 1 tablet (5 mg total) by mouth 2 (two) times daily.   aspirin EC 81 MG tablet Take 81 mg by mouth daily.   citalopram 40 MG tablet Commonly known as:  CELEXA TAKE ONE TABLET BY MOUTH EVERY NIGHT AT BEDTIME.   docusate sodium 100 MG capsule Commonly known as:  COLACE Take 1 capsule (100 mg total) by mouth 2 (two) times daily as needed for mild constipation.   HUMALOG KWIKPEN 100 UNIT/ML KiwkPen Generic drug:  insulin lispro Inject 12 Units into the skin 2 (two) times daily before a meal. *Use as directed per sliding scale*   insulin glargine 100 UNIT/ML injection Commonly known as:  LANTUS Inject 0.42 mLs (42 Units total) into the skin at bedtime. What changed:  how much to take   levothyroxine 112 MCG tablet Commonly known as:  SYNTHROID, LEVOTHROID Take 1 tablet (112 mcg total) by mouth daily before breakfast.   metFORMIN 500 MG 24 hr tablet Commonly known as:  GLUCOPHAGE-XR Take 2 tablets (1,000 mg total) by mouth 2 (two) times daily. Please start this on 8/24 What changed:  additional instructions   metoprolol succinate 50 MG 24 hr tablet Commonly known as:   TOPROL-XL Take 1 tablet (50 mg total) by mouth daily. Take with or immediately following a meal. Start taking on:  11/09/2017   mometasone-formoterol 200-5 MCG/ACT Aero Commonly known as:  DULERA Inhale 2 puffs into the lungs 2 (two) times daily.   multivitamin with minerals Tabs tablet Take 1 tablet by mouth daily.   nitroGLYCERIN 0.4 MG SL tablet Commonly known as:  NITROSTAT Place 1 tablet (0.4 mg total) under the tongue every 5 (five) minutes as needed for chest pain.   ropinirole 5 MG tablet Commonly known as:  REQUIP Take 5 mg by mouth at bedtime.   rosuvastatin 10 MG tablet Commonly known as:  CRESTOR Take 10 mg by mouth at bedtime.   tiotropium 18 MCG inhalation capsule Commonly known as:  SPIRIVA Place 1 capsule (18 mcg total) into inhaler and inhale every morning.   trospium 20 MG tablet Commonly known as:  SANCTURA Take 1 tablet (20 mg total) by mouth 2 (two) times daily as needed. For stent irritation   Vitamin D3 2000 units Tabs Take 2,000 Units by mouth daily.        DISCHARGE INSTRUCTIONS:   Follow-up with primary care physician in 5 days Follow-up with Dr. Ubaldo Glassing in a week  for outpatient Holter monitoring  DIET:  Cardiac diet and Diabetic diet  DISCHARGE CONDITION:  Stable  ACTIVITY:  Activity as tolerated  OXYGEN:  Home Oxygen: No.   Oxygen Delivery: room air  DISCHARGE LOCATION:  home   If you experience worsening of your admission symptoms, develop shortness of breath, life threatening emergency, suicidal or homicidal thoughts you must seek medical attention immediately by calling 911 or calling your MD immediately  if symptoms less severe.  You Must read complete instructions/literature along with all the possible adverse reactions/side effects for all the Medicines you take and that have been prescribed to you. Take any new Medicines after you have completely understood and accpet all the possible adverse reactions/side effects.    Please note  You were cared for by a hospitalist during your hospital stay. If you have any questions about your discharge medications or the care you received while you were in the hospital after you are discharged, you can call the unit and asked to speak with the hospitalist on call if the hospitalist that took care of you is not available. Once you are discharged, your primary care physician will handle any further medical issues. Please note that NO REFILLS for any discharge medications will be authorized once you are discharged, as it is imperative that you return to your primary care physician (or establish a relationship with a primary care physician if you do not have one) for your aftercare needs so that they can reassess your need for medications and monitor your lab values.     Today  Chief Complaint  Patient presents with  . Back Pain   Patient is feeling much better.  Denies any dizziness today.  According to cardiology patient has chronic  dizziness.  No chest pain.  Orthostatics are negative.  Okay to discharge patient from cardiology standpoint  ROS:  CONSTITUTIONAL: Denies fevers, chills. Denies any fatigue, weakness.  EYES: Denies blurry vision, double vision, eye pain. EARS, NOSE, THROAT: Denies tinnitus, ear pain, hearing loss. RESPIRATORY: Denies cough, wheeze, shortness of breath.  CARDIOVASCULAR: Denies chest pain, palpitations, edema.  GASTROINTESTINAL: Denies nausea, vomiting, diarrhea, abdominal pain. Denies bright red blood per rectum. GENITOURINARY: Denies dysuria, hematuria. ENDOCRINE: Denies nocturia or thyroid problems. HEMATOLOGIC AND LYMPHATIC: Denies easy bruising or bleeding. SKIN: Denies rash or lesion. MUSCULOSKELETAL: Denies pain in neck, back, shoulder, knees, hips or arthritic symptoms.  NEUROLOGIC: Denies paralysis, paresthesias.  PSYCHIATRIC: Denies anxiety or depressive symptoms.   VITAL SIGNS:  Blood pressure (!) 127/46, pulse 61,  temperature 98.1 F (36.7 C), temperature source Oral, resp. rate 18, height 5\' 2"  (1.575 m), weight 80.3 kg (177 lb), SpO2 93 %.  I/O:    Intake/Output Summary (Last 24 hours) at 11/08/2017 1222 Last data filed at 11/08/2017 1100 Gross per 24 hour  Intake 250 ml  Output 1350 ml  Net -1100 ml    PHYSICAL EXAMINATION:  GENERAL:  84 y.o.-year-old patient lying in the bed with no acute distress.  EYES: Pupils equal, round, reactive to light and accommodation. No scleral icterus. Extraocular muscles intact.  HEENT: Head atraumatic, normocephalic. Oropharynx and nasopharynx clear.  NECK:  Supple, no jugular venous distention. No thyroid enlargement, no tenderness.  LUNGS: Normal breath sounds bilaterally, no wheezing, rales,rhonchi or crepitation. No use of accessory muscles of respiration.  CARDIOVASCULAR: S1, S2 normal. No murmurs, rubs, or gallops.  ABDOMEN: Soft, non-tender, non-distended. Bowel sounds present. No organomegaly or mass.  EXTREMITIES: No pedal edema, cyanosis, or clubbing.  NEUROLOGIC:  Cranial nerves II through XII are intact. Muscle strength 5/5 in all extremities. Sensation intact. Gait not checked.  PSYCHIATRIC: The patient is alert and oriented x 3.  SKIN: No obvious rash, lesion, or ulcer.   DATA REVIEW:   CBC Recent Labs  Lab 11/07/17 1216  WBC 10.8  HGB 10.5*  HCT 32.8*  PLT 312    Chemistries  Recent Labs  Lab 11/08/17 0509  NA 139  K 3.3*  CL 100*  CO2 30  GLUCOSE 175*  BUN 23*  CREATININE 0.98  CALCIUM 8.4*    Cardiac Enzymes Recent Labs  Lab 11/08/17 0509  TROPONINI 1.20*    Microbiology Results  Results for orders placed or performed in visit on 10/27/17  Microscopic Examination     Status: Abnormal   Collection Time: 10/27/17  3:08 PM  Result Value Ref Range Status   WBC, UA 0-5 0 - 5 /hpf Final   RBC, UA None seen 0 - 2 /hpf Final   Epithelial Cells (non renal) 0-10 0 - 10 /hpf Final   Casts Present (A) None seen /lpf Final    Cast Type Hyaline casts N/A Final   Mucus, UA Present (A) Not Estab. Final   Bacteria, UA Few (A) None seen/Few Final    RADIOLOGY:  Dg Lumbar Spine 2-3 Views  Result Date: 11/07/2017 CLINICAL DATA:  Recent fall with low back pain EXAM: LUMBAR SPINE - 3 VIEW COMPARISON:  08/17/2017. FINDINGS: Five lumbar type vertebral bodies are well visualized. Vertebral body height is well maintained with the exception of inferior endplate compression deformity at L1 which is stable from the prior CT examination. No new compression deformity is seen. Disc space narrowing is noted at L2-3 and L3-4 as well as L4-5. Mild osteophytic changes are noted. Pelvic ring is intact. No soft tissue abnormality is noted. Aortic calcifications are seen. IMPRESSION: Chronic L1 compression deformity. Multilevel degenerative changes stable from the previous exam. Electronically Signed   By: Inez Catalina M.D.   On: 11/07/2017 15:02   Dg Chest Port 1 View  Result Date: 11/07/2017 CLINICAL DATA:  Recent fall EXAM: PORTABLE CHEST 1 VIEW COMPARISON:  09/21/2017 FINDINGS: Cardiac shadow is mildly enlarged but stable. Pacing device is again seen. Aortic calcifications are again noted. Mild interstitial changes are again seen bilaterally. No focal infiltrate or sizable effusion is noted. No bony abnormality is seen. IMPRESSION: Chronic changes without acute abnormality. Electronically Signed   By: Inez Catalina M.D.   On: 11/07/2017 14:11   Dg Hip Unilat W Or Wo Pelvis 2-3 Views Left  Result Date: 11/07/2017 CLINICAL DATA:  Recent fall left hip pain, initial encounter EXAM: DG HIP (WITH OR WITHOUT PELVIS) 3V LEFT COMPARISON:  None. FINDINGS: Pelvic ring is intact. Degenerative changes of the hip joints are noted bilaterally. No acute fracture or dislocation is noted. Degenerative changes of the lumbar spine are seen. IMPRESSION: No acute abnormality noted. Electronically Signed   By: Inez Catalina M.D.   On: 11/07/2017 15:05    EKG:    Orders placed or performed during the hospital encounter of 11/07/17  . ED EKG  . ED EKG  . EKG 12-Lead  . EKG 12-Lead      Management plans discussed with the patient, family and they are in agreement.  CODE STATUS:     Code Status Orders  (From admission, onward)        Start     Ordered   11/07/17 1658  Full code  Continuous     11/07/17 1657    Code Status History    Date Active Date Inactive Code Status Order ID Comments User Context   09/16/2017 14:23 09/23/2017 15:25 Full Code [id number]  Fritzi Mandes, MD Inpatient   08/17/2017 19:58 08/20/2017 14:47 Full Code [id number]  Idelle Crouch, MD Inpatient   07/03/2016 16:37 07/04/2016 12:55 Full Code [id number]  Isaias Cowman, MD Inpatient   06/14/2016 23:44 06/16/2016 13:58 Full Code [id number]  Harvie Bridge, DO Inpatient   06/07/2016 01:28 06/08/2016 15:12 Full Code [id number]  Lance Coon, MD Inpatient   04/23/2016 14:04 04/24/2016 14:35 Full Code [id number]  Yolonda Kida, MD Inpatient   04/21/2016 20:18 04/22/2016 08:59 Full Code [id number]  Idelle Crouch, MD Inpatient    Advance Directive Documentation     Most Recent Value  Type of Advance Directive  Healthcare Power of Attorney, Living will  Pre-existing out of facility DNR order (yellow form or pink MOST form)  No data  "MOST" Form in Place?  No data      TOTAL TIME TAKING CARE OF THIS PATIENT: 43 minutes.   Note: This dictation was prepared with Dragon dictation along with smaller phrase technology. Any transcriptional errors that result from this process are unintentional.   @[facility] @  on 11/08/2017 at 12:22 PM  Between 7am to 6pm - Pager - [phone]  After 6pm go to [email or web address] - password [email or web address] [place] [facility]  Office  [phone]  CC: Primary care physician; Idelle Crouch, MD

## 2017-11-08 NOTE — Evaluation (Signed)
Physical Therapy Evaluation Patient Details Name: Cassandra [name] MRN: [id number] DOB: [birth year] Today's Date: 11/08/2017   History of Present Illness  84 yo female with fall on 3/2 was admitted with syncopal episode and noted acute renal failure, back pain with old compression fracture, elevated troponin from demand ischemia.  PMHx:  B12 defic, OA, CAD, DM, MI, pacemaker, sleep apnea, OSA, atrial flutter,   Clinical Impression  Pt is set up to dc soon but established goals for PT due to her sudden increase in pain complaints with short walk.  Her plan is to be referred to HHPT and follow up with outpatient to give her some pain management within the parameters of a person with pacemaker.  Follow acutely if dc is not done today, progress her mobility and instruct for ROM to upper body and ice with nursing to apply.    Follow Up Recommendations Home health PT;Supervision for mobility/OOB    Equipment Recommendations  None recommended by PT(has canes and walkers)    Recommendations for Other Services       Precautions / Restrictions Precautions Precautions: Fall;ICD/Pacemaker(telemetry) Restrictions Weight Bearing Restrictions: No      Mobility  Bed Mobility Overal bed mobility: Needs Assistance Bed Mobility: Supine to Sit;Sit to Supine     Supine to sit: Supervision;Min guard Sit to supine: Min guard;Supervision   General bed mobility comments: reminders for sequence  Transfers Overall transfer level: Needs assistance Equipment used: Rolling walker (2 wheeled);1 person hand held assist Transfers: Sit to/from Stand Sit to Stand: Min guard;Min assist         General transfer comment: mainly assisting to support for her feeling of fatigue as she is    Ambulation/Gait Ambulation/Gait assistance: Min guard;Min assist Ambulation Distance (Feet): 50 Feet Assistive device: Rolling walker (2 wheeled);1 person hand held assist Gait Pattern/deviations: Step-to  pattern;Step-through pattern;Decreased stride length;Wide base of support;Trunk flexed Gait velocity: reduced Gait velocity interpretation: Below normal speed for age/gender General Gait Details: walked to the hall and back with RW as pt declined to sit and rest but complained of increased back pain  Stairs            Wheelchair Mobility    Modified Rankin (Stroke Patients Only)       Balance Overall balance assessment: History of Falls                                           Pertinent Vitals/Pain Pain Assessment: 0-10 Pain Score: 10-Worst pain ever Pain Location: L flank Pain Descriptors / Indicators: Aching;Sharp Pain Intervention(s): Limited activity within patient's tolerance;Monitored during session;Premedicated before session;Repositioned;Patient requesting pain meds-RN notified;RN gave pain meds during session    Home Living Family/patient expects to be discharged to:: Private residence Living Arrangements: Spouse/significant other Available Help at Discharge: Family Type of Home: Apartment Home Access: Level entry     Home Layout: One level Home Equipment: Environmental consultant - 2 wheels;Cane - single point;Walker - 4 wheels      Prior Function Level of Independence: Independent         Comments: Indep with ADLs, household mobility; intermittent use of 4WRW with community distances.  Denies fall history; no home O2.     Hand Dominance   Dominant Hand: Right    Extremity/Trunk Assessment   Upper Extremity Assessment Upper Extremity Assessment: Overall WFL for tasks assessed    Lower Extremity  Assessment Lower Extremity Assessment: Overall WFL for tasks assessed    Cervical / Trunk Assessment Cervical / Trunk Assessment: Normal  Communication   Communication: No difficulties  Cognition Arousal/Alertness: Awake/alert Behavior During Therapy: WFL for tasks assessed/performed Overall Cognitive Status: Within Functional Limits for tasks  assessed                                 General Comments: Pt agitated about her back pain but did not have any notes to indicate her back was hurting regarding nursing communication       General Comments      Exercises     Assessment/Plan    PT Assessment Patient needs continued PT services  PT Problem List Decreased range of motion;Decreased activity tolerance;Decreased balance;Decreased mobility;Decreased coordination;Decreased knowledge of use of DME;Decreased safety awareness;Cardiopulmonary status limiting activity;Decreased skin integrity;Pain       PT Treatment Interventions DME instruction;Gait training;Functional mobility training;Therapeutic activities;Therapeutic exercise;Balance training;Neuromuscular re-education;Patient/family education    PT Goals (Current goals can be found in the Care Plan section)  Acute Rehab PT Goals Patient Stated Goal: to hurt less PT Goal Formulation: With patient/family Time For Goal Achievement: 11/22/17 Potential to Achieve Goals: Good    Frequency Min 2X/week   Barriers to discharge Other (comment) home with husband to assist her    Co-evaluation               AM-PAC PT "6 Clicks" Daily Activity  Outcome Measure Difficulty turning over in bed (including adjusting bedclothes, sheets and blankets)?: A Little Difficulty moving from lying on back to sitting on the side of the bed? : A Little Difficulty sitting down on and standing up from a chair with arms (e.g., wheelchair, bedside commode, etc,.)?: A Little Help needed moving to and from a bed to chair (including a wheelchair)?: A Little Help needed walking in hospital room?: A Little Help needed climbing 3-5 steps with a railing? : A Little 6 Click Score: 18    End of Session Equipment Utilized During Treatment: Gait belt Activity Tolerance: Patient limited by pain Patient left: in bed;with call bell/phone within reach;with bed alarm set;with  family/visitor present;with nursing/sitter in room Nurse Communication: Mobility status PT Visit Diagnosis: Unsteadiness on feet (R26.81);Other abnormalities of gait and mobility (R26.89);Pain Pain - Right/Left: Left Pain - part of body: (flank)    Time: 1194-1740 PT Time Calculation (min) (ACUTE ONLY): 38 min   Charges:   PT Evaluation $PT Eval Moderate Complexity: 1 Mod PT Treatments $Gait Training: 8-22 mins   PT G Codes:   PT G-Codes **NOT FOR INPATIENT CLASS** Functional Assessment Tool Used: AM-PAC 6 Clicks Basic Mobility    Ramond Dial 11/08/2017, 2:40 PM   Mee Hives, PT MS Acute Rehab Dept. Number: [place] [facility] [place]

## 2017-11-11 ENCOUNTER — Ambulatory Visit: Payer: Medicare Other | Attending: Internal Medicine

## 2017-11-11 DIAGNOSIS — G4733 Obstructive sleep apnea (adult) (pediatric): Secondary | ICD-10-CM | POA: Diagnosis present

## 2017-11-13 DIAGNOSIS — G4733 Obstructive sleep apnea (adult) (pediatric): Secondary | ICD-10-CM | POA: Diagnosis not present

## 2017-11-14 ENCOUNTER — Telehealth: Payer: Self-pay | Admitting: *Deleted

## 2017-11-14 DIAGNOSIS — G4733 Obstructive sleep apnea (adult) (pediatric): Secondary | ICD-10-CM

## 2017-11-14 NOTE — Telephone Encounter (Signed)
Pt informed that we are ordering a cpap machine and she will need to f/u in approx 31-90 days. nothing further needed.

## 2017-11-24 ENCOUNTER — Telehealth: Payer: Self-pay | Admitting: Internal Medicine

## 2017-11-24 NOTE — Telephone Encounter (Signed)
Robin with [facility] is calling to make sure we are aware that patient has been on BiPap since May 2015. Pressure EPAP 12 IPAP 16. New order is for Auto CPAP Range 12-20 cm H20.   Per [name] at [facility], she just wanted to make [place] aware that patient has a BiPap and has been on that since May 2015. She can change BiPap Range to CPAP Mode but was just wanting to make sure before pressure has been changed that we were aware that patient is on BiPap before she changes it to CPAP Mode.     Please advise. Rhonda J Cobb

## 2017-11-24 NOTE — Telephone Encounter (Signed)
Yes, can change to CPAP.

## 2017-11-25 NOTE — Telephone Encounter (Signed)
Called and spoke with Robin at [facility]. Advised [name] that per Dr. Juanell Fairly, stated to change to CPAP mode.  Robin voiced understanding and will change patient's machine to CPAP.  Nothing else needed at this time. Rhonda J Cobb

## 2017-12-29 ENCOUNTER — Ambulatory Visit: Payer: Medicare Other | Admitting: Internal Medicine

## 2018-01-07 ENCOUNTER — Ambulatory Visit
Admission: RE | Admit: 2018-01-07 | Discharge: 2018-01-07 | Disposition: A | Discharge status: Home / Self Care | Payer: Medicare Other | Source: Ambulatory Visit | Attending: Family Medicine | Admitting: Family Medicine

## 2018-01-07 ENCOUNTER — Other Ambulatory Visit: Payer: Self-pay | Admitting: Family Medicine

## 2018-01-07 DIAGNOSIS — K5730 Diverticulosis of large intestine without perforation or abscess without bleeding: Secondary | ICD-10-CM | POA: Diagnosis not present

## 2018-01-07 DIAGNOSIS — R1032 Left lower quadrant pain: Secondary | ICD-10-CM

## 2018-01-07 DIAGNOSIS — M4856XA Collapsed vertebra, not elsewhere classified, lumbar region, initial encounter for fracture: Secondary | ICD-10-CM | POA: Diagnosis not present

## 2018-01-07 DIAGNOSIS — R109 Unspecified abdominal pain: Secondary | ICD-10-CM | POA: Diagnosis present

## 2018-01-07 DIAGNOSIS — R29890 Loss of height: Secondary | ICD-10-CM | POA: Diagnosis not present

## 2018-01-07 DIAGNOSIS — I700 Atherosclerosis of aorta: Secondary | ICD-10-CM | POA: Diagnosis not present

## 2018-01-07 DIAGNOSIS — I2510 Atherosclerotic heart disease of native coronary artery without angina pectoris: Secondary | ICD-10-CM | POA: Diagnosis not present

## 2018-01-07 DIAGNOSIS — Z950 Presence of cardiac pacemaker: Secondary | ICD-10-CM | POA: Diagnosis not present

## 2018-01-07 HISTORY — DX: Disorder of kidney and ureter, unspecified: N28.9

## 2018-01-07 MED ORDER — IOPAMIDOL (ISOVUE-370) INJECTION 76%
75.0000 mL | Freq: Once | INTRAVENOUS | Status: AC | PRN
  Administered 2018-01-07: 75 mL via INTRAVENOUS

## 2018-01-16 ENCOUNTER — Other Ambulatory Visit: Payer: Self-pay | Admitting: Internal Medicine

## 2018-01-16 ENCOUNTER — Ambulatory Visit
Admission: RE | Admit: 2018-01-16 | Discharge: 2018-01-16 | Disposition: A | Discharge status: Home / Self Care | Payer: Medicare Other | Source: Ambulatory Visit | Attending: Internal Medicine | Admitting: Internal Medicine

## 2018-01-16 DIAGNOSIS — M7989 Other specified soft tissue disorders: Secondary | ICD-10-CM

## 2018-01-16 DIAGNOSIS — M7122 Synovial cyst of popliteal space [Baker], left knee: Secondary | ICD-10-CM | POA: Diagnosis not present

## 2018-01-19 ENCOUNTER — Other Ambulatory Visit: Payer: Self-pay | Admitting: Orthopedic Surgery

## 2018-01-19 DIAGNOSIS — S22080A Wedge compression fracture of T11-T12 vertebra, initial encounter for closed fracture: Secondary | ICD-10-CM

## 2018-01-20 ENCOUNTER — Encounter
Admission: RE | Admit: 2018-01-20 | Discharge: 2018-01-20 | Disposition: A | Discharge status: Home / Self Care | Payer: Medicare Other | Source: Ambulatory Visit | Attending: Orthopedic Surgery | Admitting: Orthopedic Surgery

## 2018-01-20 DIAGNOSIS — Z9012 Acquired absence of left breast and nipple: Secondary | ICD-10-CM | POA: Insufficient documentation

## 2018-01-20 DIAGNOSIS — I10 Essential (primary) hypertension: Secondary | ICD-10-CM | POA: Diagnosis not present

## 2018-01-20 DIAGNOSIS — M5136 Other intervertebral disc degeneration, lumbar region: Secondary | ICD-10-CM | POA: Diagnosis not present

## 2018-01-20 DIAGNOSIS — E119 Type 2 diabetes mellitus without complications: Secondary | ICD-10-CM | POA: Diagnosis not present

## 2018-01-20 DIAGNOSIS — S22080A Wedge compression fracture of T11-T12 vertebra, initial encounter for closed fracture: Secondary | ICD-10-CM | POA: Diagnosis not present

## 2018-01-20 DIAGNOSIS — Z853 Personal history of malignant neoplasm of breast: Secondary | ICD-10-CM | POA: Diagnosis not present

## 2018-01-20 MED ORDER — TECHNETIUM TC 99M MEDRONATE IV KIT
20.0000 | PACK | Freq: Once | INTRAVENOUS | Status: AC | PRN
  Administered 2018-01-20: 22.14 via INTRAVENOUS

## 2018-01-21 ENCOUNTER — Other Ambulatory Visit: Payer: Self-pay | Admitting: Nurse Practitioner

## 2018-01-21 DIAGNOSIS — R1012 Left upper quadrant pain: Secondary | ICD-10-CM

## 2018-01-28 ENCOUNTER — Other Ambulatory Visit: Payer: Self-pay | Admitting: Nurse Practitioner

## 2018-01-28 DIAGNOSIS — R1012 Left upper quadrant pain: Secondary | ICD-10-CM

## 2018-02-02 ENCOUNTER — Ambulatory Visit
Admission: RE | Admit: 2018-02-02 | Discharge: 2018-02-02 | Disposition: A | Discharge status: Home / Self Care | Payer: Medicare Other | Source: Ambulatory Visit | Attending: Nurse Practitioner | Admitting: Nurse Practitioner

## 2018-02-02 ENCOUNTER — Encounter
Admission: RE | Admit: 2018-02-02 | Discharge: 2018-02-02 | Disposition: A | Discharge status: Home / Self Care | Payer: Medicare Other | Source: Ambulatory Visit | Attending: Orthopedic Surgery | Admitting: Orthopedic Surgery

## 2018-02-02 ENCOUNTER — Other Ambulatory Visit: Payer: Self-pay

## 2018-02-02 ENCOUNTER — Ambulatory Visit: Admission: RE | Admit: 2018-02-02 | Payer: Medicare Other | Source: Ambulatory Visit

## 2018-02-02 DIAGNOSIS — R1012 Left upper quadrant pain: Secondary | ICD-10-CM

## 2018-02-02 DIAGNOSIS — K222 Esophageal obstruction: Secondary | ICD-10-CM | POA: Insufficient documentation

## 2018-02-02 DIAGNOSIS — K228 Other specified diseases of esophagus: Secondary | ICD-10-CM | POA: Insufficient documentation

## 2018-02-02 DIAGNOSIS — K219 Gastro-esophageal reflux disease without esophagitis: Secondary | ICD-10-CM | POA: Insufficient documentation

## 2018-02-02 HISTORY — DX: Unspecified urinary incontinence: R32

## 2018-02-02 HISTORY — DX: Anxiety disorder, unspecified: F41.9

## 2018-02-02 HISTORY — DX: Presence of cardiac pacemaker: Z95.0

## 2018-02-02 MED ORDER — CEFAZOLIN SODIUM-DEXTROSE 2-4 GM/100ML-% IV SOLN
2.0000 g | Freq: Once | INTRAVENOUS | Status: AC
  Administered 2018-02-03: 2 g via INTRAVENOUS

## 2018-02-02 NOTE — Patient Instructions (Addendum)
Your procedure is scheduled on: 02/03/18  At 13:30 Report to Same Day Surgery 2nd floor medical mall [facility] Entrance-take elevator on left to 2nd floor.  Check in with surgery information desk.)   Remember: Instructions that are not followed completely may result in serious medical risk, up to and including death, or upon the discretion of your surgeon and anesthesiologist your surgery may need to be rescheduled.    _x___ 1. Do not eat food after midnight the night before your procedure. You may drink clear liquids up to 2 hours before you are scheduled to arrive at the hospital for your procedure.  Do not drink clear liquids within 2 hours of your scheduled arrival to the hospital.  Clear liquids include  --Water or Apple juice without pulp  --Clear carbohydrate beverage such as ClearFast or Gatorade  --Black Coffee or Clear Tea (No milk, no creamers, do not add anything to                  the coffee or Tea Type 1 and type 2 diabetics should only drink water.  No gum chewing or hard candies.     __x__ 2. No Alcohol for 24 hours before or after surgery.   __x__3. No Smoking or e-cigarettes for 24 prior to surgery.  Do not use any chewable tobacco products for at least 6 hour prior to surgery   ____  4. Bring all medications with you on the day of surgery if instructed.    __x__ 5. Notify your doctor if there is any change in your medical condition     (cold, fever, infections).    x___6. On the morning of surgery brush your teeth with toothpaste and water.  You may rinse your mouth with mouth wash if you wish.  Do not swallow any toothpaste or mouthwash.   Do not wear jewelry, make-up, hairpins, clips or nail polish.  Do not wear lotions, powders, or perfumes. You may wear deodorant.  Do not shave 48 hours prior to surgery. Men may shave face and neck.  Do not bring valuables to the hospital.    [facility] is not responsible for any belongings or valuables.    Contacts, dentures or bridgework may not be worn into surgery.  Leave your suitcase in the car. After surgery it may be brought to your room.  For patients admitted to the hospital, discharge time is determined by your                       treatment team.  _  Patients discharged the day of surgery will not be allowed to drive home.  You will need someone to drive you home and stay with you the night of your procedure.    Please read over the following fact sheets that you were given:   [facility] Preparing for Surgery and or MRSA Information   _x___ Take anti-hypertensive listed below, cardiac, seizure, asthma,     anti-reflux and psychiatric medicines. These include:  1. levothyroxine (SYNTHROID, LEVOTHROID) 112 MCG tablet  2.metoprolol succinate (TOPROL-XL) 50 MG 24 hr tablet  3.mometasone-formoterol (DULERA) 200-5 MCG/ACT AERO  4.tiotropium (SPIRIVA) 18 MCG inhalation capsule  5.  6.  ____Fleets enema or Magnesium Citrate as directed.   _x___ Use CHG Soap or sage wipes as directed on instruction sheet   ____ Use inhalers on the day of surgery and bring to hospital day of surgery  __x__ Stop Metformin and Janumet  today  __x__ Take 1/2 of usual insulin dose the night before surgery and none on the morning     surgery.   _x___ Follow recommendations from Cardiologist, Pulmonologist or PCP regarding          stopping Aspirin, Coumadin, Plavix ,Eliquis, Effient, or Pradaxa, and Pletal.  X____Stop Anti-inflammatories such as Advil, Aleve, Ibuprofen, Motrin, Naproxen, Naprosyn, Goodies powders or aspirin products. OK to take Tylenol and                          Celebrex.   _x___ Stop supplements until after surgery.  But may continue Vitamin D, Vitamin B,       and multivitamin.   ____ Bring C-Pap to the hospital.

## 2018-02-03 ENCOUNTER — Encounter
Admission: RE | Disposition: A | Discharge status: Home / Self Care | Payer: Self-pay | Source: Ambulatory Visit | Attending: Orthopedic Surgery

## 2018-02-03 ENCOUNTER — Ambulatory Visit
Admission: RE | Admit: 2018-02-03 | Discharge: 2018-02-03 | Disposition: A | Discharge status: Home / Self Care | Payer: Medicare Other | Source: Ambulatory Visit | Attending: Orthopedic Surgery | Admitting: Orthopedic Surgery

## 2018-02-03 ENCOUNTER — Encounter: Payer: Self-pay | Admitting: Anesthesiology

## 2018-02-03 ENCOUNTER — Ambulatory Visit: Payer: Medicare Other

## 2018-02-03 ENCOUNTER — Ambulatory Visit: Payer: Medicare Other | Admitting: Anesthesiology

## 2018-02-03 ENCOUNTER — Encounter (INDEPENDENT_AMBULATORY_CARE_PROVIDER_SITE_OTHER): Payer: Medicare Other | Admitting: Vascular Surgery

## 2018-02-03 DIAGNOSIS — Z9011 Acquired absence of right breast and nipple: Secondary | ICD-10-CM | POA: Insufficient documentation

## 2018-02-03 DIAGNOSIS — I2510 Atherosclerotic heart disease of native coronary artery without angina pectoris: Secondary | ICD-10-CM | POA: Diagnosis not present

## 2018-02-03 DIAGNOSIS — G4730 Sleep apnea, unspecified: Secondary | ICD-10-CM | POA: Diagnosis not present

## 2018-02-03 DIAGNOSIS — Z9989 Dependence on other enabling machines and devices: Secondary | ICD-10-CM | POA: Insufficient documentation

## 2018-02-03 DIAGNOSIS — I252 Old myocardial infarction: Secondary | ICD-10-CM | POA: Diagnosis not present

## 2018-02-03 DIAGNOSIS — Z87891 Personal history of nicotine dependence: Secondary | ICD-10-CM | POA: Insufficient documentation

## 2018-02-03 DIAGNOSIS — I6523 Occlusion and stenosis of bilateral carotid arteries: Secondary | ICD-10-CM

## 2018-02-03 DIAGNOSIS — Z950 Presence of cardiac pacemaker: Secondary | ICD-10-CM | POA: Insufficient documentation

## 2018-02-03 DIAGNOSIS — I10 Essential (primary) hypertension: Secondary | ICD-10-CM | POA: Insufficient documentation

## 2018-02-03 DIAGNOSIS — Z853 Personal history of malignant neoplasm of breast: Secondary | ICD-10-CM | POA: Diagnosis not present

## 2018-02-03 DIAGNOSIS — Z9221 Personal history of antineoplastic chemotherapy: Secondary | ICD-10-CM | POA: Insufficient documentation

## 2018-02-03 DIAGNOSIS — Z85828 Personal history of other malignant neoplasm of skin: Secondary | ICD-10-CM | POA: Insufficient documentation

## 2018-02-03 DIAGNOSIS — Z79899 Other long term (current) drug therapy: Secondary | ICD-10-CM | POA: Insufficient documentation

## 2018-02-03 DIAGNOSIS — E039 Hypothyroidism, unspecified: Secondary | ICD-10-CM | POA: Insufficient documentation

## 2018-02-03 DIAGNOSIS — W1830XA Fall on same level, unspecified, initial encounter: Secondary | ICD-10-CM | POA: Diagnosis not present

## 2018-02-03 DIAGNOSIS — T148XXA Other injury of unspecified body region, initial encounter: Secondary | ICD-10-CM

## 2018-02-03 DIAGNOSIS — J449 Chronic obstructive pulmonary disease, unspecified: Secondary | ICD-10-CM | POA: Insufficient documentation

## 2018-02-03 DIAGNOSIS — Z7982 Long term (current) use of aspirin: Secondary | ICD-10-CM | POA: Insufficient documentation

## 2018-02-03 DIAGNOSIS — F419 Anxiety disorder, unspecified: Secondary | ICD-10-CM | POA: Diagnosis not present

## 2018-02-03 DIAGNOSIS — E1151 Type 2 diabetes mellitus with diabetic peripheral angiopathy without gangrene: Secondary | ICD-10-CM | POA: Insufficient documentation

## 2018-02-03 DIAGNOSIS — S22089A Unspecified fracture of T11-T12 vertebra, initial encounter for closed fracture: Secondary | ICD-10-CM | POA: Diagnosis not present

## 2018-02-03 DIAGNOSIS — M549 Dorsalgia, unspecified: Secondary | ICD-10-CM | POA: Diagnosis present

## 2018-02-03 DIAGNOSIS — Z79890 Hormone replacement therapy (postmenopausal): Secondary | ICD-10-CM | POA: Insufficient documentation

## 2018-02-03 DIAGNOSIS — Z794 Long term (current) use of insulin: Secondary | ICD-10-CM | POA: Insufficient documentation

## 2018-02-03 DIAGNOSIS — F329 Major depressive disorder, single episode, unspecified: Secondary | ICD-10-CM | POA: Diagnosis not present

## 2018-02-03 HISTORY — PX: KYPHOPLASTY: SHX5884

## 2018-02-03 LAB — POCT I-STAT 4, (NA,K, GLUC, HGB,HCT)
GLUCOSE: 202 mg/dL — AB (ref 65–99)
HCT: 31 % — ABNORMAL LOW (ref 36.0–46.0)
Hemoglobin: 10.5 g/dL — ABNORMAL LOW (ref 12.0–15.0)
Potassium: 3.7 mmol/L (ref 3.5–5.1)
Sodium: 139 mmol/L (ref 135–145)

## 2018-02-03 LAB — GLUCOSE, CAPILLARY
GLUCOSE-CAPILLARY: 192 mg/dL — AB (ref 65–99)
GLUCOSE-CAPILLARY: 177 mg/dL — AB (ref 65–99)

## 2018-02-03 SURGERY — KYPHOPLASTY
Anesthesia: General | Site: Back | Wound class: "Clean "

## 2018-02-03 MED ORDER — BUPIVACAINE-EPINEPHRINE (PF) 0.5% -1:200000 IJ SOLN
INTRAMUSCULAR | Status: AC
  Filled 2018-02-03: qty 30

## 2018-02-03 MED ORDER — METOPROLOL SUCCINATE ER 25 MG PO TB24
50.0000 mg | ORAL_TABLET | Freq: Once | ORAL | Status: AC
  Administered 2018-02-03: 50 mg via ORAL
  Filled 2018-02-03: qty 2

## 2018-02-03 MED ORDER — FENTANYL CITRATE (PF) 100 MCG/2ML IJ SOLN
INTRAMUSCULAR | Status: AC
  Filled 2018-02-03: qty 2

## 2018-02-03 MED ORDER — METOCLOPRAMIDE HCL 10 MG PO TABS: 5.0000 mg | ORAL_TABLET | Freq: Three times a day (TID) | ORAL | Status: DC | PRN

## 2018-02-03 MED ORDER — SODIUM CHLORIDE 0.9 % IV SOLN
INTRAVENOUS | Status: DC
  Administered 2018-02-03: 15:00:00 via INTRAVENOUS

## 2018-02-03 MED ORDER — PROPOFOL 500 MG/50ML IV EMUL
INTRAVENOUS | Status: DC | PRN
  Administered 2018-02-03: 25 ug/kg/min via INTRAVENOUS

## 2018-02-03 MED ORDER — METOCLOPRAMIDE HCL 5 MG/ML IJ SOLN: 5.0000 mg | Freq: Three times a day (TID) | INTRAMUSCULAR | Status: DC | PRN

## 2018-02-03 MED ORDER — ONDANSETRON HCL 4 MG PO TABS: 4.0000 mg | ORAL_TABLET | Freq: Four times a day (QID) | ORAL | Status: DC | PRN

## 2018-02-03 MED ORDER — HYDROCODONE-ACETAMINOPHEN 5-325 MG PO TABS: 1.0000 | ORAL_TABLET | ORAL | Status: DC | PRN

## 2018-02-03 MED ORDER — BUPIVACAINE-EPINEPHRINE (PF) 0.5% -1:200000 IJ SOLN
INTRAMUSCULAR | Status: DC | PRN
  Administered 2018-02-03: 15 mL via PERINEURAL

## 2018-02-03 MED ORDER — OXYCODONE HCL 5 MG/5ML PO SOLN: 5.0000 mg | Freq: Once | ORAL | Status: DC | PRN

## 2018-02-03 MED ORDER — IOPAMIDOL (ISOVUE-M 200) INJECTION 41%
INTRAMUSCULAR | Status: AC
  Filled 2018-02-03: qty 10

## 2018-02-03 MED ORDER — FENTANYL CITRATE (PF) 100 MCG/2ML IJ SOLN
25.0000 ug | INTRAMUSCULAR | Status: DC | PRN
Start: 2018-02-03 — End: 2018-02-03

## 2018-02-03 MED ORDER — OXYCODONE HCL 5 MG PO TABS
ORAL_TABLET | ORAL | Status: AC
  Filled 2018-02-03: qty 1

## 2018-02-03 MED ORDER — IOPAMIDOL (ISOVUE-M 200) INJECTION 41%
INTRAMUSCULAR | Status: DC | PRN
  Administered 2018-02-03 (×2): 20 mL

## 2018-02-03 MED ORDER — IPRATROPIUM-ALBUTEROL 0.5-2.5 (3) MG/3ML IN SOLN
3.0000 mL | Freq: Once | RESPIRATORY_TRACT | Status: AC
  Administered 2018-02-03: 3 mL via RESPIRATORY_TRACT

## 2018-02-03 MED ORDER — FENTANYL CITRATE (PF) 100 MCG/2ML IJ SOLN
INTRAMUSCULAR | Status: DC | PRN
  Administered 2018-02-03: 50 ug via INTRAVENOUS
  Administered 2018-02-03 (×2): 25 ug via INTRAVENOUS

## 2018-02-03 MED ORDER — FAMOTIDINE 20 MG PO TABS
20.0000 mg | ORAL_TABLET | Freq: Once | ORAL | Status: AC
  Administered 2018-02-03: 20 mg via ORAL

## 2018-02-03 MED ORDER — METOPROLOL TARTRATE 50 MG PO TABS
ORAL_TABLET | ORAL | Status: AC
  Filled 2018-02-03: qty 1

## 2018-02-03 MED ORDER — PROPOFOL 10 MG/ML IV BOLUS
INTRAVENOUS | Status: AC
  Filled 2018-02-03: qty 20

## 2018-02-03 MED ORDER — PROPOFOL 10 MG/ML IV BOLUS
INTRAVENOUS | Status: DC | PRN
  Administered 2018-02-03: 40 mg via INTRAVENOUS

## 2018-02-03 MED ORDER — FAMOTIDINE 20 MG PO TABS
ORAL_TABLET | ORAL | Status: AC
  Administered 2018-02-03: 20 mg via ORAL
  Filled 2018-02-03: qty 1

## 2018-02-03 MED ORDER — IPRATROPIUM-ALBUTEROL 0.5-2.5 (3) MG/3ML IN SOLN
RESPIRATORY_TRACT | Status: AC
  Administered 2018-02-03: 3 mL via RESPIRATORY_TRACT
  Filled 2018-02-03: qty 3

## 2018-02-03 MED ORDER — LIDOCAINE HCL 1 % IJ SOLN
INTRAMUSCULAR | Status: DC | PRN
  Administered 2018-02-03: 10 mL
  Administered 2018-02-03: 15 mL

## 2018-02-03 MED ORDER — ONDANSETRON HCL 4 MG/2ML IJ SOLN: 4.0000 mg | Freq: Four times a day (QID) | INTRAMUSCULAR | Status: DC | PRN

## 2018-02-03 MED ORDER — SODIUM CHLORIDE 0.9 % IV SOLN: INTRAVENOUS | Status: DC

## 2018-02-03 MED ORDER — OXYCODONE HCL 5 MG PO TABS: 5.0000 mg | ORAL_TABLET | Freq: Once | ORAL | Status: DC | PRN

## 2018-02-03 MED ORDER — LIDOCAINE HCL (PF) 1 % IJ SOLN
INTRAMUSCULAR | Status: AC
  Filled 2018-02-03: qty 60

## 2018-02-03 MED ORDER — CEFAZOLIN SODIUM-DEXTROSE 2-4 GM/100ML-% IV SOLN
INTRAVENOUS | Status: AC
  Filled 2018-02-03: qty 100

## 2018-02-03 SURGICAL SUPPLY — 16 items
CEMENT KYPHON CX01A KIT/MIXER (Cement) ×3 IMPLANT
DERMABOND ADVANCED (GAUZE/BANDAGES/DRESSINGS) ×2
DERMABOND ADVANCED .7 DNX12 (GAUZE/BANDAGES/DRESSINGS) ×1 IMPLANT
DEVICE BIOPSY BONE KYPHX (INSTRUMENTS) ×3 IMPLANT
DRAPE C-ARM XRAY 36X54 (DRAPES) ×3 IMPLANT
DURAPREP 26ML APPLICATOR (WOUND CARE) ×3 IMPLANT
GLOVE SURG SYN 9.0  PF PI (GLOVE) ×2
GLOVE SURG SYN 9.0 PF PI (GLOVE) ×1 IMPLANT
GOWN SRG 2XL LVL 4 RGLN SLV (GOWNS) ×1 IMPLANT
GOWN STRL NON-REIN 2XL LVL4 (GOWNS) ×2
GOWN STRL REUS W/ TWL LRG LVL3 (GOWN DISPOSABLE) ×1 IMPLANT
GOWN STRL REUS W/TWL LRG LVL3 (GOWN DISPOSABLE) ×2
PACK KYPHOPLASTY (MISCELLANEOUS) ×3 IMPLANT
STRAP SAFETY 5IN WIDE (MISCELLANEOUS) ×3 IMPLANT
TRAY KYPHOPAK 15/3 EXPRESS 1ST (MISCELLANEOUS) ×1 IMPLANT
TRAY KYPHOPAK 20/3 EXPRESS 1ST (MISCELLANEOUS) ×3 IMPLANT

## 2018-02-03 NOTE — H&P (Signed)
Reviewed paper H+P, will be scanned into chart. No changes noted. SPECT scan showed T11 fracture she is having persistent pain and comes in for T11 kyphoplasty

## 2018-02-03 NOTE — Anesthesia Preprocedure Evaluation (Signed)
Anesthesia Evaluation  Patient identified by MRN, date of birth, ID band Patient awake    Reviewed: Allergy & Precautions, H&P , NPO status , Patient's Chart, lab work & pertinent test results  History of Anesthesia Complications Negative for: history of anesthetic complications  Airway Mallampati: III  TM Distance: <3 FB Neck ROM: limited    Dental  (+) Chipped, Poor Dentition, Missing, Partial Lower, Upper Dentures   Pulmonary neg shortness of breath, sleep apnea and Continuous Positive Airway Pressure Ventilation , COPD, former smoker,           Cardiovascular Exercise Tolerance: Good hypertension, + CAD, + Past MI and + Peripheral Vascular Disease  + pacemaker      Neuro/Psych  Headaches, PSYCHIATRIC DISORDERS Anxiety Depression    GI/Hepatic negative GI ROS, Neg liver ROS,   Endo/Other  diabetes, Type 2Hypothyroidism   Renal/GU Renal disease  negative genitourinary   Musculoskeletal  (+) Arthritis ,   Abdominal   Peds  Hematology negative hematology ROS (+)   Anesthesia Other Findings Past Medical History: 01/06/2014: Acquired hypothyroidism No date: Anxiety No date: Arthritis 02/15/2014: B12 deficiency 01/06/2014: Benign essential hypertension 1982: Breast cancer ([place])     Comment:  Right breast cancer - chemotherapy 01/06/2014: CAD (coronary artery disease), native coronary artery No date: Carotid artery calcification 01/06/2014: Chronic airway obstruction, not elsewhere classified No date: Depression No date: Diabetes mellitus without complication ([place]) 0865: History of kidney stones     Comment:  left ureteral stone No date: Incontinence of urine 1982: Myocardial infarction [facility])     Comment:  small infarct No date: Pacemaker 07/2016: Presence of permanent cardiac pacemaker 01/06/2014: Pure hypercholesterolemia No date: Renal insufficiency No date: Skin cancer No date: Sleep apnea     Comment:  uses  cpap  Past Surgical History: No date: APPENDECTOMY 1982/redo in 2014: [place]; Bilateral 04/23/2016: CARDIAC CATHETERIZATION; N/A     Comment:  Procedure: Left Heart Cath and Coronary Angiography;                Surgeon: Corey Skains, MD;  Location: [place]               CV LAB;  Service: Cardiovascular;  Laterality: N/A; 04/23/2016: CARDIAC CATHETERIZATION; Left     Comment:  Procedure: Coronary Stent Intervention;  Surgeon: Yolonda Kida, MD;  Location: [place] [address] CV [facility];                Service: Cardiovascular;  Laterality: Left; 2010: CAROTID ARTERY ANGIOPLASTY; Left     Comment:  had stent inserted and removed d/t infection. artery               from leg inserted in left carotid No date: CHOLECYSTECTOMY september 9th 2017: CORONARY ANGIOPLASTY WITH STENT PLACEMENT 08/17/2017: CYSTOSCOPY W/ URETERAL STENT PLACEMENT; Left     Comment:  Procedure: CYSTOSCOPY WITH RETROGRADE PYELOGRAM/URETERAL              STENT PLACEMENT;  Surgeon: Festus Aloe, MD;                Location: [facility] ORS;  Service: Urology;  Laterality: Left; 09/16/2017: CYSTOSCOPY W/ URETERAL STENT PLACEMENT; Left     Comment:  Procedure: CYSTOSCOPY WITH STENT REPLACEMENT;  Surgeon:               Abbie Sons, MD;  Location: [facility] OR[facility];  Service:  Urology;  Laterality: Left; 09/16/2017: CYSTOSCOPY/RETROGRADE/URETEROSCOPY/STONE EXTRACTION WITH  BASKET; Left     Comment:  Procedure: CYSTOSCOPY/RETROGRADE/URETEROSCOPY/STONE               EXTRACTION WITH BASKET;  Surgeon: Abbie Sons, MD;                Location: [facility] ORS;  Service: Urology;  Laterality: Left; No date: EYE SURGERY; Bilateral     Comment:  cataract extraction 1982: MASTECTOMY; Right 07/03/2016: PACEMAKER INSERTION; Left     Comment:  Procedure: INSERTION PACEMAKER;  Surgeon: Isaias Cowman, MD;  Location: [facility] OR[facility];  Service:               Cardiovascular;  Laterality:  Left; No date: SKIN CANCER EXCISION No date: TUBAL LIGATION  BMI    Body Mass Index:  31.28 kg/m      Reproductive/Obstetrics negative OB ROS                             Anesthesia Physical Anesthesia Plan  ASA: III  Anesthesia Plan: General   Post-op Pain Management:    Induction: Intravenous  PONV Risk Score and Plan: Propofol infusion, Midazolam and TIVA  Airway Management Planned: Natural Airway and Nasal Cannula  Additional Equipment:   Intra-op Plan:   Post-operative Plan:   Informed Consent: I have reviewed the patients History and Physical, chart, labs and discussed the procedure including the risks, benefits and alternatives for the proposed anesthesia with the patient or authorized representative who has indicated his/her understanding and acceptance.   Dental Advisory Given  Plan Discussed with: Anesthesiologist, CRNA and Surgeon  Anesthesia Plan Comments: (Patient consented for risks of anesthesia including but not limited to:  - adverse reactions to medications - risk of intubation if required - damage to teeth, lips or other oral mucosa - sore throat or hoarseness - Damage to heart, brain, lungs or loss of life  Patient voiced understanding.)        Anesthesia Quick Evaluation

## 2018-02-03 NOTE — Transfer of Care (Signed)
Immediate Anesthesia Transfer of Care Note  Patient: Cassandra [name]  Procedure(s) Performed: Truitt Merle (N/A Back)  Patient Location: PACU  Anesthesia Type:General  Level of Consciousness: awake, alert  and oriented  Airway & Oxygen Therapy: Patient Spontanous Breathing  Post-op Assessment: Report given to RN and Post -op Vital signs reviewed and stable  Post vital signs: Reviewed and stable  Last Vitals:  Vitals Value Taken Time  BP 144/61 02/03/2018  3:56 PM  Temp 36.9 C 02/03/2018  3:56 PM  Pulse 65 02/03/2018  3:58 PM  Resp 16 02/03/2018  3:58 PM  SpO2 93 % 02/03/2018  3:58 PM  Vitals shown include unvalidated device data.  Last Pain:  Vitals:   02/03/18 1347  TempSrc: Tympanic  PainSc: 7       Patients Stated Pain Goal: 1 (14/78/29 5621)  Complications: No apparent anesthesia complications

## 2018-02-03 NOTE — Discharge Instructions (Addendum)
Take it easy today and tomorrow.  Remove Band-Aid on Thursday, okay to shower after that.  Resume more normal activities starting Thursday.  Call if having problems [phone]   AMBULATORY SURGERY  DISCHARGE INSTRUCTIONS   1) The drugs that you were given will stay in your system until tomorrow so for the next 24 hours you should not:  A) Drive an automobile B) Make any legal decisions C) Drink any alcoholic beverage   2) You may resume regular meals tomorrow.  Today it is better to start with liquids and gradually work up to solid foods.  You may eat anything you prefer, but it is better to start with liquids, then soup and crackers, and gradually work up to solid foods.   3) Please notify your doctor immediately if you have any unusual bleeding, trouble breathing, redness and pain at the surgery site, drainage, fever, or pain not relieved by medication.    4) Additional Instructions:        Please contact your physician with any problems or Same Day Surgery at [phone], Monday through Friday 6 am to 4 pm, or  at [facility] number at [phone].

## 2018-02-03 NOTE — Anesthesia Post-op Follow-up Note (Signed)
Anesthesia QCDR form completed.        

## 2018-02-03 NOTE — Anesthesia Postprocedure Evaluation (Signed)
Anesthesia Post Note  Patient: Cassandra [name]  Procedure(s) Performed: [id number] (N/A Back)  Patient location during evaluation: PACU Anesthesia Type: General Level of consciousness: awake and alert Pain management: pain level controlled Vital Signs Assessment: post-procedure vital signs reviewed and stable Respiratory status: spontaneous breathing, nonlabored ventilation, respiratory function stable and patient connected to nasal cannula oxygen Cardiovascular status: blood pressure returned to baseline and stable Postop Assessment: no apparent nausea or vomiting Anesthetic complications: no     Last Vitals:  Vitals:   02/03/18 1706 02/03/18 1710  BP:  (!) 118/49  Pulse: 65 66  Resp: 16 16  Temp:  36.7 C  SpO2: 96% 95%    Last Pain:  Vitals:   02/03/18 1710  TempSrc:   PainSc: 0-No pain    LLE Motor Response: Purposeful movement (02/03/18 1710)   RLE Motor Response: Purposeful movement (02/03/18 1710)        Doshia Dalia S

## 2018-02-03 NOTE — Op Note (Signed)
02/03/2018  3:53 PM  PATIENT:  Cassandra [name]  84 y.o. female  PRE-OPERATIVE DIAGNOSIS:  traumatic compression fracture of T11 THORACIC  POST-OPERATIVE DIAGNOSIS:  traumatic compression fracture of T11 THORACIC  PROCEDURE:  Procedure(s): KYPHOPLASTY-T11 (N/A)  SURGEON: Laurene Footman, MD  ASSISTANTS: None  ANESTHESIA:   local and MAC  EBL:  No intake/output data recorded.  BLOOD ADMINISTERED:none  DRAINS: none   LOCAL MEDICATIONS USED:  MARCAINE    and XYLOCAINE   SPECIMEN:  Source of Specimen:  T11 vertebral body  DISPOSITION OF SPECIMEN:  PATHOLOGY  COUNTS:  YES  TOURNIQUET:  * No tourniquets in log *  IMPLANTS: Bone cement  DICTATION: .Dragon Dictation patient was brought to the operating room and after adequate sedation was given she was placed prone.  Good visualization of the T11 body was obtained in both AP and lateral projections.  After appropriate patient identification and timeout procedure local anesthetic was infiltrated on the right side near the planned incision.  The back was then prepped and draped you sterile fashion and repeat timeout procedure carried out.  A final needle was then used to get down to the pedicle on the right and a 50-50 mix of 1% Xylocaine half percent Sensorcaine with epinephrine was infiltrated along the tract from the pedicle to the skin.  After allowing this to set a small incision was made and trocar advanced in a transpedicular fashion.  Biopsy was obtained followed by drilling and placement of the balloon with inflation.  When the cement was mixed in the appropriate consistency approximately 3 cc of bone cement was used to fill the body with good fill from left to right side superior to inferior endplate.  When this had set trochars removed without any extravasation.  The wound was closed with Dermabond followed by Band-Aid  PLAN OF CARE: Discharge to home after PACU  PATIENT DISPOSITION:  PACU - hemodynamically stable.

## 2018-02-04 ENCOUNTER — Encounter: Payer: Self-pay | Admitting: Orthopedic Surgery

## 2018-02-05 LAB — SURGICAL PATHOLOGY

## 2018-02-23 ENCOUNTER — Encounter (INDEPENDENT_AMBULATORY_CARE_PROVIDER_SITE_OTHER): Payer: Self-pay | Admitting: Vascular Surgery

## 2018-02-23 ENCOUNTER — Ambulatory Visit (INDEPENDENT_AMBULATORY_CARE_PROVIDER_SITE_OTHER): Payer: Medicare Other | Admitting: Vascular Surgery

## 2018-02-23 VITALS — BP 144/60 | HR 78 | Resp 17 | Ht 62.0 in | Wt 177.0 lb

## 2018-02-23 DIAGNOSIS — E118 Type 2 diabetes mellitus with unspecified complications: Secondary | ICD-10-CM | POA: Diagnosis not present

## 2018-02-23 DIAGNOSIS — I25118 Atherosclerotic heart disease of native coronary artery with other forms of angina pectoris: Secondary | ICD-10-CM

## 2018-02-23 DIAGNOSIS — I890 Lymphedema, not elsewhere classified: Secondary | ICD-10-CM | POA: Diagnosis not present

## 2018-02-23 DIAGNOSIS — I10 Essential (primary) hypertension: Secondary | ICD-10-CM | POA: Diagnosis not present

## 2018-03-01 ENCOUNTER — Encounter (INDEPENDENT_AMBULATORY_CARE_PROVIDER_SITE_OTHER): Payer: Self-pay | Admitting: Vascular Surgery

## 2018-03-01 DIAGNOSIS — I890 Lymphedema, not elsewhere classified: Secondary | ICD-10-CM | POA: Insufficient documentation

## 2018-03-01 NOTE — Progress Notes (Signed)
MRN : [id number]  Cassandra [name] is a 84 y.o. ([birth year]) female who presents with chief complaint of  Chief Complaint  Patient presents with  . Follow-up    Edema in lower extremeties  .  History of Present Illness:   The patient returns to the office for followup evaluation regarding leg swelling.  The swelling has improved quite a bit and the pain associated with swelling has decreased substantially. There have not been any interval development of a ulcerations or wounds.  Since the previous visit the patient has been wearing graduated compression stockings and has noted little significant improvement in the lymphedema. The patient has been using compression routinely morning until night.  The patient also states elevation during the day and exercise is being done too.   Current Meds  Medication Sig  . apixaban (ELIQUIS) 5 MG TABS tablet Take 1 tablet (5 mg total) by mouth 2 (two) times daily.  [name] aspirin EC 81 MG tablet Take 81 mg by mouth daily.  . Cholecalciferol (VITAMIN D3) 2000 units TABS Take 2,000 Units by mouth daily.  . citalopram (CELEXA) 40 MG tablet TAKE ONE TABLET BY MOUTH EVERY NIGHT AT BEDTIME.  [name] docusate sodium (COLACE) 100 MG capsule Take 1 capsule (100 mg total) by mouth 2 (two) times daily as needed for mild constipation.  . furosemide (LASIX) 40 MG tablet TAKE ONE TABLET TWICE DAILY  . gabapentin (NEURONTIN) 300 MG capsule Take by mouth.  [name] HUMALOG KWIKPEN 100 UNIT/ML KiwkPen Inject 12 Units into the skin 2 (two) times daily before a meal. *Use as directed per sliding scale*  . HYDROcodone-acetaminophen (NORCO/VICODIN) 5-325 MG tablet   . insulin glargine (LANTUS) 100 UNIT/ML injection Inject 0.42 mLs (42 Units total) into the skin at bedtime. (Patient taking differently: Inject 50 Units into the skin at bedtime. )  . levothyroxine (SYNTHROID, LEVOTHROID) 112 MCG tablet Take 1 tablet (112 mcg total) by mouth daily before breakfast.  . LORazepam (ATIVAN)  1 MG tablet TAKE ONE TABLET BY MOUTH AT BEDTIME  . metFORMIN (GLUCOPHAGE-XR) 500 MG 24 hr tablet Take 2 tablets (1,000 mg total) by mouth 2 (two) times daily. Please start this on 8/24 (Patient taking differently: Take 1,000 mg by mouth 2 (two) times daily. Pl)  . metoprolol succinate (TOPROL-XL) 50 MG 24 hr tablet Take 1 tablet (50 mg total) by mouth daily. Take with or immediately following a meal.  . mometasone-formoterol (DULERA) 200-5 MCG/ACT AERO Inhale 2 puffs into the lungs 2 (two) times daily. (Patient taking differently: Inhale 2 puffs into the lungs 2 (two) times daily as needed. )  . Multiple Vitamin (MULTIVITAMIN WITH MINERALS) TABS tablet Take 1 tablet by mouth daily.  . nitroGLYCERIN (NITROSTAT) 0.4 MG SL tablet Place 1 tablet (0.4 mg total) under the tongue every 5 (five) minutes as needed for chest pain.  [name] nystatin (MYCOSTATIN) 100000 UNIT/ML suspension   . omeprazole (PRILOSEC) 40 MG capsule Take by mouth.  . ropinirole (REQUIP) 5 MG tablet Take 5 mg by mouth at bedtime.   . rosuvastatin (CRESTOR) 10 MG tablet Take 10 mg by mouth at bedtime.   . solifenacin (VESICARE) 10 MG tablet Take by mouth.  . sucralfate (CARAFATE) 1 GM/10ML suspension Take by mouth.  . tiotropium (SPIRIVA) 18 MCG inhalation capsule Place 1 capsule (18 mcg total) into inhaler and inhale every morning. (Patient taking differently: Place 18 mcg into inhaler and inhale daily as needed. )  . trospium (SANCTURA) 20 MG tablet Take  1 tablet (20 mg total) by mouth 2 (two) times daily as needed. For stent irritation    Past Medical History:  Diagnosis Date  . Acquired hypothyroidism 01/06/2014  . Anxiety   . Arthritis   . B12 deficiency 02/15/2014  . Benign essential hypertension 01/06/2014  . Breast cancer [facility]) 1982   Right breast cancer - chemotherapy  . CAD (coronary artery disease), native coronary artery 01/06/2014  . Carotid artery calcification   . Chronic airway obstruction, not elsewhere classified  01/06/2014  . Depression   . Diabetes mellitus without complication ([place])   . History of kidney stones 2019   left ureteral stone  . Incontinence of urine   . Myocardial infarction ([place]) 1982   small infarct  . Pacemaker   . Presence of permanent cardiac pacemaker 07/2016  . Pure hypercholesterolemia 01/06/2014  . Renal insufficiency   . Skin cancer   . Sleep apnea    uses cpap    Past Surgical History:  Procedure Laterality Date  . APPENDECTOMY    . AUGMENTATION MAMMAPLASTY Bilateral 1982/redo in 2014   prior mastectomy  . CARDIAC CATHETERIZATION N/A 04/23/2016   Procedure: Left Heart Cath and Coronary Angiography;  Surgeon: Corey Skains, MD;  Location: [facility];  Service: Cardiovascular;  Laterality: N/A;  . CARDIAC CATHETERIZATION Left 04/23/2016   Procedure: Coronary Stent Intervention;  Surgeon: Yolonda Kida, MD;  Location: [facility];  Service: Cardiovascular;  Laterality: Left;  . CAROTID ARTERY ANGIOPLASTY Left 2010   had stent inserted and removed d/t infection. artery from leg inserted in left carotid  . CAROTID ENDARTERECTOMY Left 2010  . CHOLECYSTECTOMY    . CORONARY ANGIOPLASTY WITH STENT PLACEMENT  september 9th 2017  . CYSTOSCOPY W/ URETERAL STENT PLACEMENT Left 08/17/2017   Procedure: CYSTOSCOPY WITH RETROGRADE PYELOGRAM/URETERAL STENT PLACEMENT;  Surgeon: Festus Aloe, MD;  Location: [facility];  Service: Urology;  Laterality: Left;  . CYSTOSCOPY W/ URETERAL STENT PLACEMENT Left 09/16/2017   Procedure: CYSTOSCOPY WITH STENT REPLACEMENT;  Surgeon: Abbie Sons, MD;  Location: [facility];  Service: Urology;  Laterality: Left;  . CYSTOSCOPY/RETROGRADE/URETEROSCOPY/STONE EXTRACTION WITH BASKET Left 09/16/2017   Procedure: CYSTOSCOPY/RETROGRADE/URETEROSCOPY/STONE EXTRACTION WITH BASKET;  Surgeon: Abbie Sons, MD;  Location: [facility];  Service: Urology;  Laterality: Left;  . EYE SURGERY Bilateral    cataract extraction  . KYPHOPLASTY  N/A 02/03/2018   Procedure: [id number];  Surgeon: Hessie Knows, MD;  Location: [facility];  Service: Orthopedics;  Laterality: N/A;  . MASTECTOMY Right 1982  . PACEMAKER INSERTION Left 07/03/2016   Procedure: INSERTION PACEMAKER;  Surgeon: Isaias Cowman, MD;  Location: [facility];  Service: Cardiovascular;  Laterality: Left;  . SKIN CANCER EXCISION    . TUBAL LIGATION      Social History Social History   Tobacco Use  . Smoking status: Former Smoker    Packs/day: 0.50    Types: Cigarettes    Last attempt to quit: 09/02/1996    Years since quitting: 21.5  . Smokeless tobacco: Never Used  Substance Use Topics  . Alcohol use: Yes    Alcohol/week: 0.6 oz    Types: 1 Glasses of wine per week    Comment: occasionally  . Drug use: No    Family History Family History  Problem Relation Age of Onset  . Prostate cancer Neg Hx   . Kidney cancer Neg Hx   . Breast cancer Neg Hx     No Known Allergies   REVIEW  OF SYSTEMS (Negative unless checked)  Constitutional: [] Weight loss  [] Fever  [] Chills Cardiac: [] Chest pain   [] Chest pressure   [] Palpitations   [] Shortness of breath when laying flat   [] Shortness of breath with exertion. Vascular:  [] Pain in legs with walking   [] Pain in legs at rest  [] History of DVT   [] Phlebitis   [x] Swelling in legs   [] Varicose veins   [] Non-healing ulcers Pulmonary:   [] Uses home oxygen   [] Productive cough   [] Hemoptysis   [] Wheeze  [] COPD   [] Asthma Neurologic:  [] Dizziness   [] Seizures   [] History of stroke   [] History of TIA  [] Aphasia   [] Vissual changes   [] Weakness or numbness in arm   [] Weakness or numbness in leg Musculoskeletal:   [] Joint swelling   [] Joint pain   [] Low back pain Hematologic:  [] Easy bruising  [] Easy bleeding   [] Hypercoagulable state   [] Anemic Gastrointestinal:  [] Diarrhea   [] Vomiting  [] Gastroesophageal reflux/heartburn   [] Difficulty swallowing. Genitourinary:  [] Chronic kidney disease   [] Difficult urination   [] Frequent urination   [] Blood in urine Skin:  [] Rashes   [] Ulcers  Psychological:  [] History of anxiety   []  History of major depression.  Physical Examination  Vitals:   02/23/18 1453  BP: (!) 144/60  Pulse: 78  Resp: 17  Weight: 177 lb (80.3 kg)  Height: 5\' 2"  (1.575 m)   Body mass index is 32.37 kg/m. Gen: WD/WN, NAD Head: [place]/AT, No temporalis wasting.  Ear/Nose/Throat: Hearing grossly intact, nares w/o erythema or drainage Eyes: PER, EOMI, sclera nonicteric.  Neck: Supple, no large masses.   Pulmonary:  Good air movement, no audible wheezing bilaterally, no use of accessory muscles.  Cardiac: RRR, no JVD Vascular: scattered varicosities present bilaterally.  Mild venous stasis changes to the legs bilaterally.  2-3+ soft pitting edema Vessel Right Left  Radial Palpable Palpable  PT Palpable Palpable  DP Palpable Palpable  Gastrointestinal: Non-distended. No guarding/no peritoneal signs.  Musculoskeletal: M/S 5/5 throughout.  No deformity or atrophy.  Neurologic: CN 2-12 intact. Symmetrical.  Speech is fluent. Motor exam as listed above. Psychiatric: Judgment intact, Mood & affect appropriate for pt's clinical situation. Dermatologic: mild venous rashes no ulcers noted.  No changes consistent with cellulitis. Lymph : No lichenification or skin changes of chronic lymphedema.  CBC Lab Results  Component Value Date   WBC 10.8 11/07/2017   HGB 10.5 (L) 02/03/2018   HCT 31.0 (L) 02/03/2018   MCV 73.0 (L) 11/07/2017   PLT 312 11/07/2017    BMET    Component Value Date/Time   NA 139 02/03/2018 1422   NA 140 02/23/2014 1406   K 3.7 02/03/2018 1422   K 3.9 02/23/2014 1406   CL 100 (L) 11/08/2017 0509   CL 103 02/23/2014 1406   CO2 30 11/08/2017 0509   CO2 30 02/23/2014 1406   GLUCOSE 202 (H) 02/03/2018 1422   GLUCOSE 70 02/23/2014 1406   BUN 23 (H) 11/08/2017 0509   BUN 16 02/23/2014 1406   CREATININE 0.98 11/08/2017 0509   CREATININE 0.75 02/23/2014 1406    CALCIUM 8.4 (L) 11/08/2017 0509   CALCIUM 9.3 02/23/2014 1406   GFRNONAA 52 (L) 11/08/2017 0509   GFRNONAA >60 02/23/2014 1406   GFRAA >60 11/08/2017 0509   GFRAA >60 02/23/2014 1406   CrCl cannot be calculated (Patient's most recent lab result is older than the maximum 21 days allowed.).  COAG Lab Results  Component Value Date   INR 1.22 08/17/2017  INR 1.01 06/27/2016   INR 1.11 06/07/2016    Radiology Dg Thoracic Spine 2 View  Result Date: 02/03/2018 CLINICAL DATA:  T11 kyphoplasty. EXAM: THORACIC SPINE 2 VIEWS; DG C-ARM 61-120 MIN COMPARISON:  CT abdomen pelvis dated Jan 07, 2018. FINDINGS: Interval T11 kyphoplasty. Vertebral body height is not significantly changed. No acute abnormality. IMPRESSION: 1. Interval T11 kyphoplasty. FLUOROSCOPY TIME:  1 minutes, 15 seconds. C-arm fluoroscopic images were obtained intraoperatively and submitted for post operative interpretation. Electronically Signed   By: Titus Dubin M.D.   On: 02/03/2018 16:08   Dg Ugi W/o Kub  Result Date: 02/02/2018 CLINICAL DATA:  84 year old female with left upper quadrant pain for 3 weeks. Evaluate for possible ulcer. Initial encounter. EXAM: UPPER GI SERIES WITHOUT KUB TECHNIQUE: Routine upper GI series was performed with thin/high density barium. FLUOROSCOPY TIME:  Fluoroscopy Time:  2 minutes and 30 seconds. Radiation Exposure Index: 50.9 mGy. COMPARISON:  01/07/2018 CT abdomen and pelvis. FINDINGS: Exam had to be performed in the upright position secondary to T11 compression fracture. Patient not able to tolerate supine imaging. Initially, no laryngeal penetration or aspiration. When the patient was placed into a supine position, gastroesophageal reflux occurred with mild laryngeal aspiration. Slightly blunted primary esophageal stripping wave. Mild smooth narrowing distal esophagus. 13 mm barium tablet becomes lodged in the distal esophagus and only clears with ingestion of several mouthfuls of water. No obvious  gastric mass or ulceration. No duodenal bulb ulceration. Proximally visualized small bowel within normal limits. Post cholecystectomy.  Sequential pacemaker in place. IMPRESSION: Initially, no laryngeal penetration or aspiration. When the patient was placed into a supine position, gastroesophageal reflux occurred with mild laryngeal aspiration. Mild presbyesophagus. Mild smooth narrowing distal esophagus. 13 mm barium tablet becomes lodged in the distal esophagus and only clears with ingestion of several mouthfuls of water. Tablet sent home with patient and patient advised to make sure ingested food was smaller than size of tablet. Upper GI series had to be performed predominantly in an upright position as patient not able to tolerate supine imaging. No gastric mass or ulceration noted. No duodenal ulceration detected. Electronically Signed   By: Genia Del M.D.   On: 02/02/2018 10:18   Dg C-arm 1-60 Min  Result Date: 02/03/2018 CLINICAL DATA:  T11 kyphoplasty. EXAM: THORACIC SPINE 2 VIEWS; DG C-ARM 61-120 MIN COMPARISON:  CT abdomen pelvis dated Jan 07, 2018. FINDINGS: Interval T11 kyphoplasty. Vertebral body height is not significantly changed. No acute abnormality. IMPRESSION: 1. Interval T11 kyphoplasty. FLUOROSCOPY TIME:  1 minutes, 15 seconds. C-arm fluoroscopic images were obtained intraoperatively and submitted for post operative interpretation. Electronically Signed   By: Titus Dubin M.D.   On: 02/03/2018 16:08      Assessment/Plan 1. Lymphedema  No surgery or intervention at this point in time.    I have reviewed my discussion with the patient regarding lymphedema and why it  causes symptoms.  Patient will continue wearing graduated compression stockings class 1 (20-30 mmHg) on a daily basis a prescription was given. The patient is reminded to put the stockings on first thing in the morning and removing them in the evening. The patient is instructed specifically not to sleep in the  stockings.   In addition, behavioral modification throughout the day will be continued.  This will include frequent elevation (such as in a recliner), use of over the counter pain medications as needed and exercise such as walking.  I have reviewed systemic causes for chronic edema such as  liver, kidney and cardiac etiologies and there does not appear to be any significant changes in these organ systems over the past year.  The patient is under the impression that these organ systems are all stable and unchanged.    The patient will continue aggressive use of the  lymph pump.  This will continue to improve the edema control and prevent sequela such as ulcers and infections. Her pump is old and she notes it doesn't work well so I will see if we can get her an updated machine.  The patient will follow-up with me on an annual basis.    2. Coronary artery disease of native artery of native heart with stable angina pectoris ([place]) Continue cardiac and antihypertensive medications as already ordered and reviewed, no changes at this time.  Continue statin as ordered and reviewed, no changes at this time  Nitrates PRN for chest pain   3. Benign essential hypertension Continue antihypertensive medications as already ordered, these medications have been reviewed and there are no changes at this time.   4. Type 2 diabetes mellitus with complication, without long-term current use of insulin ([facility]) Continue hypoglycemic medications as already ordered, these medications have been reviewed and there are no changes at this time.  Hgb A1C to be monitored as already arranged by primary service     Hortencia Pilar, MD  03/01/2018 2:10 PM

## 2018-03-16 ENCOUNTER — Telehealth (INDEPENDENT_AMBULATORY_CARE_PROVIDER_SITE_OTHER): Payer: Self-pay

## 2018-03-16 NOTE — Telephone Encounter (Signed)
Patient called to see I her lymph pump had been ordered? She is just wanting to know if her insurance is going to improve it?

## 2018-03-16 NOTE — Telephone Encounter (Signed)
I spoke with Dr. Delana Meyer and he did say that this patient is supposed to be doing the lymph pump therapy, aggresively, according to his note. So the lymph pump does need to be ordered for this patient, I think he forgot to put that on her billing ticket at the time of her checkout at her last appointment.

## 2018-03-16 NOTE — Telephone Encounter (Signed)
If ordered then the information will be in [employer], and the company gets the approval for the lympedema pump.

## 2018-03-31 DIAGNOSIS — R933 Abnormal findings on diagnostic imaging of other parts of digestive tract: Secondary | ICD-10-CM | POA: Insufficient documentation

## 2018-06-26 ENCOUNTER — Other Ambulatory Visit: Payer: Self-pay | Admitting: Internal Medicine

## 2018-06-26 DIAGNOSIS — R42 Dizziness and giddiness: Secondary | ICD-10-CM

## 2018-07-01 ENCOUNTER — Ambulatory Visit: Payer: Medicare Other

## 2018-07-15 ENCOUNTER — Other Ambulatory Visit: Payer: Self-pay

## 2018-07-15 ENCOUNTER — Emergency Department: Payer: Medicare Other

## 2018-07-15 ENCOUNTER — Emergency Department
Admission: EM | Admit: 2018-07-15 | Discharge: 2018-07-15 | Disposition: A | Discharge status: Home / Self Care | Payer: Medicare Other | Attending: Emergency Medicine | Admitting: Emergency Medicine

## 2018-07-15 DIAGNOSIS — W101XXA Fall (on)(from) sidewalk curb, initial encounter: Secondary | ICD-10-CM | POA: Diagnosis not present

## 2018-07-15 DIAGNOSIS — I2510 Atherosclerotic heart disease of native coronary artery without angina pectoris: Secondary | ICD-10-CM | POA: Insufficient documentation

## 2018-07-15 DIAGNOSIS — Z7982 Long term (current) use of aspirin: Secondary | ICD-10-CM | POA: Insufficient documentation

## 2018-07-15 DIAGNOSIS — E119 Type 2 diabetes mellitus without complications: Secondary | ICD-10-CM | POA: Insufficient documentation

## 2018-07-15 DIAGNOSIS — R42 Dizziness and giddiness: Secondary | ICD-10-CM | POA: Diagnosis not present

## 2018-07-15 DIAGNOSIS — Y999 Unspecified external cause status: Secondary | ICD-10-CM | POA: Diagnosis not present

## 2018-07-15 DIAGNOSIS — S0081XA Abrasion of other part of head, initial encounter: Secondary | ICD-10-CM

## 2018-07-15 DIAGNOSIS — Z7901 Long term (current) use of anticoagulants: Secondary | ICD-10-CM | POA: Diagnosis not present

## 2018-07-15 DIAGNOSIS — Z79899 Other long term (current) drug therapy: Secondary | ICD-10-CM | POA: Insufficient documentation

## 2018-07-15 DIAGNOSIS — Z794 Long term (current) use of insulin: Secondary | ICD-10-CM | POA: Insufficient documentation

## 2018-07-15 DIAGNOSIS — E039 Hypothyroidism, unspecified: Secondary | ICD-10-CM | POA: Insufficient documentation

## 2018-07-15 DIAGNOSIS — Z87891 Personal history of nicotine dependence: Secondary | ICD-10-CM | POA: Insufficient documentation

## 2018-07-15 DIAGNOSIS — Y929 Unspecified place or not applicable: Secondary | ICD-10-CM | POA: Insufficient documentation

## 2018-07-15 DIAGNOSIS — Y93K1 Activity, walking an animal: Secondary | ICD-10-CM | POA: Insufficient documentation

## 2018-07-15 DIAGNOSIS — W19XXXA Unspecified fall, initial encounter: Secondary | ICD-10-CM

## 2018-07-15 NOTE — Discharge Instructions (Addendum)
Please seek medical attention for any high fevers, chest pain, shortness of breath, change in behavior, persistent vomiting, bloody stool or any other new or concerning symptoms.  

## 2018-07-15 NOTE — ED Triage Notes (Addendum)
Pt to ED via EMS. Pt states she was out walking her dog when the dog got under her feet and tripped her. Pt fell and landed face first onto pavement. Pt has multiple abrasions to face, bleeding controlled. Pt denies any pain at this time. Pt takes 81mg  Asprin daily. Pt denies any dizziness or LOC.

## 2018-07-15 NOTE — ED Provider Notes (Signed)
[facility] Emergency Department Provider Note  ____________________________________________   I have reviewed the triage vital signs and the nursing notes.   HISTORY  Chief Complaint Fall   History limited by: Not Limited   HPI AEDYN MCKEON is a 84 y.o. female who presents to the emergency department today because of concern for facial trauma after a fall.  The patient was out walking her dog when she got tripped up with the dog.  She fell onto her face onto a sidewalk.  She denies loss of consciousness.  She denies any extremity injury with the fall.  States that she has been dealing with some vertigo recently but denies any other recent medical complaints. States that she is no longer on eliquis.    Per medical record review patient has a history of mi, on aspirin.   Past Medical History:  Diagnosis Date  . Acquired hypothyroidism 01/06/2014  . Anxiety   . Arthritis   . B12 deficiency 02/15/2014  . Benign essential hypertension 01/06/2014  . Breast cancer [facility]) 1982   Right breast cancer - chemotherapy  . CAD (coronary artery disease), native coronary artery 01/06/2014  . Carotid artery calcification   . Chronic airway obstruction, not elsewhere classified 01/06/2014  . Depression   . Diabetes mellitus without complication ([place])   . History of kidney stones 2019   left ureteral stone  . Incontinence of urine   . Myocardial infarction ([place]) 1982   small infarct  . Pacemaker   . Presence of permanent cardiac pacemaker 07/2016  . Pure hypercholesterolemia 01/06/2014  . Renal insufficiency   . Skin cancer   . Sleep apnea    uses cpap    Patient Active Problem List   Diagnosis Date Noted  . Lymphedema 03/01/2018  . Acute respiratory failure with hypoxia ([place]) 09/16/2017  . Sepsis ([place]) 08/17/2017  . UTI (urinary tract infection) 08/17/2017  . Acute respiratory distress 08/17/2017  . 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  Sick sinus syndrome ([place]) 07/03/2016  . New onset atrial flutter ([place]) 06/14/2016  . Bradycardia, sinus 06/07/2016  . Syncope 06/06/2016  . Chest pain 04/21/2016  . Elevated troponin 04/21/2016  . Labile hypertension 04/21/2016  . Ischemic chest pain ([place]) 04/21/2016  . Long-term insulin use ([place]) 05/24/2014  . Mild vitamin D deficiency 05/20/2014  . B12 deficiency 02/15/2014  . Acquired hypothyroidism 01/06/2014  . Chronic airway obstruction, not elsewhere classified 01/06/2014  . CAD (coronary artery disease), native coronary artery 01/06/2014  . Benign essential hypertension 01/06/2014  . Headache 01/06/2014  . Hypersomnia with sleep apnea 01/06/2014  . Pure hypercholesterolemia 01/06/2014  . Type II diabetes mellitus with manifestations ([place]) 01/06/2014    Past Surgical History:  Procedure Laterality Date  . APPENDECTOMY    . AUGMENTATION MAMMAPLASTY Bilateral 1982/redo in 2014   prior mastectomy  . CARDIAC CATHETERIZATION N/A 04/23/2016   Procedure: Left Heart Cath and Coronary Angiography;  Surgeon: Corey Skains, MD;  Location: [place] CV [facility];  Service: Cardiovascular;  Laterality: N/A;  . CARDIAC CATHETERIZATION Left 04/23/2016   Procedure: Coronary Stent Intervention;  Surgeon: Yolonda Kida, MD;  Location: [place] [facility];  Service: Cardiovascular;  Laterality: Left;  . CAROTID ARTERY ANGIOPLASTY Left 2010   had stent inserted and removed d/t infection. artery from leg inserted in left carotid  . CAROTID ENDARTERECTOMY Left 2010  . CHOLECYSTECTOMY    . CORONARY ANGIOPLASTY WITH STENT PLACEMENT  september 9th 2017  .  CYSTOSCOPY W/ URETERAL STENT PLACEMENT Left 08/17/2017   Procedure: CYSTOSCOPY WITH RETROGRADE PYELOGRAM/URETERAL STENT PLACEMENT;  Surgeon: Festus Aloe, MD;  Location: [facility];  Service: Urology;  Laterality: Left;  . CYSTOSCOPY W/ URETERAL STENT PLACEMENT Left 09/16/2017   Procedure: CYSTOSCOPY WITH STENT REPLACEMENT;  Surgeon:  Abbie Sons, MD;  Location: [facility];  Service: Urology;  Laterality: Left;  . CYSTOSCOPY/RETROGRADE/URETEROSCOPY/STONE EXTRACTION WITH BASKET Left 09/16/2017   Procedure: CYSTOSCOPY/RETROGRADE/URETEROSCOPY/STONE EXTRACTION WITH BASKET;  Surgeon: Abbie Sons, MD;  Location: [facility];  Service: Urology;  Laterality: Left;  . EYE SURGERY Bilateral    cataract extraction  . KYPHOPLASTY N/A 02/03/2018   Procedure: [id number];  Surgeon: Hessie Knows, MD;  Location: [facility] OR[facility];  Service: Orthopedics;  Laterality: N/A;  . MASTECTOMY Right 1982  . PACEMAKER INSERTION Left 07/03/2016   Procedure: INSERTION PACEMAKER;  Surgeon: Isaias Cowman, MD;  Location: [facility] OR[facility];  Service: Cardiovascular;  Laterality: Left;  . SKIN CANCER EXCISION    . TUBAL LIGATION      Prior to Admission medications   Medication Sig Start Date End Date Taking? Authorizing Provider  apixaban (ELIQUIS) 5 MG TABS tablet Take 1 tablet (5 mg total) by mouth 2 (two) times daily. 06/16/16   Bettey Costa, MD  aspirin EC 81 MG tablet Take 81 mg by mouth daily.    [provider]  Cholecalciferol (VITAMIN D3) 2000 units TABS Take 2,000 Units by mouth daily.    [provider]  citalopram (CELEXA) 40 MG tablet TAKE ONE TABLET BY MOUTH EVERY NIGHT AT BEDTIME. 07/07/14   [provider]  docusate sodium (COLACE) 100 MG capsule Take 1 capsule (100 mg total) by mouth 2 (two) times daily as needed for mild constipation. 11/08/17   Gouru, Illene Silver, MD  furosemide (LASIX) 40 MG tablet TAKE ONE TABLET TWICE DAILY 02/16/18   [provider]  gabapentin (NEURONTIN) 300 MG capsule Take by mouth. 02/16/18 02/16/19  [provider]  HUMALOG KWIKPEN 100 UNIT/ML KiwkPen Inject 12 Units into the skin 2 (two) times daily before a meal. *Use as directed per sliding scale* 03/03/15   [provider]  HYDROcodone-acetaminophen (NORCO/VICODIN) 5-325 MG tablet  01/07/18   [provider]  insulin  glargine (LANTUS) 100 UNIT/ML injection Inject 0.42 mLs (42 Units total) into the skin at bedtime. Patient taking differently: Inject 50 Units into the skin at bedtime.  06/16/16   Bettey Costa, MD  levothyroxine (SYNTHROID, LEVOTHROID) 112 MCG tablet Take 1 tablet (112 mcg total) by mouth daily before breakfast. 06/17/16   Bettey Costa, MD  LORazepam (ATIVAN) 1 MG tablet TAKE ONE TABLET BY MOUTH AT BEDTIME 11/19/17   [provider]  metFORMIN (GLUCOPHAGE-XR) 500 MG 24 hr tablet Take 2 tablets (1,000 mg total) by mouth 2 (two) times daily. Please start this on 8/24 Patient taking differently: Take 1,000 mg by mouth 2 (two) times daily. Pl 04/24/16   Bettey Costa, MD  metoprolol succinate (TOPROL-XL) 50 MG 24 hr tablet Take 1 tablet (50 mg total) by mouth daily. Take with or immediately following a meal. 11/09/17   Gouru, Aruna, MD  mometasone-formoterol (DULERA) 200-5 MCG/ACT AERO Inhale 2 puffs into the lungs 2 (two) times daily. Patient taking differently: Inhale 2 puffs into the lungs 2 (two) times daily as needed.  09/23/17   Max Sane, MD  Multiple Vitamin (MULTIVITAMIN WITH MINERALS) TABS tablet Take 1 tablet by mouth daily. 09/24/17   Max Sane, MD  nitroGLYCERIN (NITROSTAT) 0.4 MG SL tablet  Place 1 tablet (0.4 mg total) under the tongue every 5 (five) minutes as needed for chest pain. 04/24/16   Bettey Costa, MD  nystatin (MYCOSTATIN) 100000 UNIT/ML suspension  01/01/18   [provider]  omeprazole (PRILOSEC) 40 MG capsule Take by mouth. 01/16/18 01/16/19  [provider]  ropinirole (REQUIP) 5 MG tablet Take 5 mg by mouth at bedtime.  03/29/15   [provider]  rosuvastatin (CRESTOR) 10 MG tablet Take 10 mg by mouth at bedtime.  03/25/16   [provider]  solifenacin (VESICARE) 10 MG tablet Take by mouth. 10/27/17 10/27/18  [provider]  sucralfate (CARAFATE) 1 GM/10ML suspension Take by mouth. 01/16/18 01/16/19  [provider]   tiotropium (SPIRIVA) 18 MCG inhalation capsule Place 1 capsule (18 mcg total) into inhaler and inhale every morning. Patient taking differently: Place 18 mcg into inhaler and inhale daily as needed.  09/23/17   Max Sane, MD  trospium (SANCTURA) 20 MG tablet Take 1 tablet (20 mg total) by mouth 2 (two) times daily as needed. For stent irritation 09/16/17   Stoioff, Ronda Fairly, MD    Allergies Patient has no known allergies.  Family History  Problem Relation Age of Onset  . Prostate cancer Neg Hx   . Kidney cancer Neg Hx   . Breast cancer Neg Hx     Social History Social History   Tobacco Use  . Smoking status: Former Smoker    Packs/day: 0.50    Types: Cigarettes    Last attempt to quit: 09/02/1996    Years since quitting: 21.8  . Smokeless tobacco: Never Used  Substance Use Topics  . Alcohol use: Yes    Alcohol/week: 1.0 standard drinks    Types: 1 Glasses of wine per week    Comment: occasionally  . Drug use: No    Review of Systems Constitutional: No fever/chills Eyes: No visual changes. ENT: No sore throat. Cardiovascular: Denies chest pain. Respiratory: Denies shortness of breath. Gastrointestinal: No abdominal pain.  No nausea, no vomiting.  No diarrhea.   Genitourinary: Negative for dysuria. Musculoskeletal: Positive for chronic back pain. Skin: Positive for facial abrasions. Neurological: Positive for dizziness.  ____________________________________________   PHYSICAL EXAM:  VITAL SIGNS: ED Triage Vitals  Enc Vitals Group     BP 07/15/18 1646 (!) 166/81     Pulse Rate 07/15/18 1647 70     Resp 07/15/18 1648 14     Temp 07/15/18 1648 98 F (36.7 C)     Temp Source 07/15/18 1648 Oral     SpO2 07/15/18 1647 98 %     Weight 07/15/18 1648 172 lb (78 kg)     Height 07/15/18 1648 5\' 3"  (1.6 m)     Head Circumference --      Peak Flow --      Pain Score 07/15/18 1647 0   Constitutional: Alert and oriented.  Eyes: Conjunctivae are normal.  ENT       Head: Normocephalic. Multiple abrasions over face. No hemotympanum.       Nose: No congestion/rhinnorhea. No septal hematomas, some dried blood in nares.      Mouth/Throat: Mucous membranes are moist.      Neck: No stridor. No midline tenderness. Hematological/Lymphatic/Immunilogical: No cervical lymphadenopathy. Cardiovascular: Normal rate, regular rhythm.  No murmurs, rubs, or gallops.  Respiratory: Normal respiratory effort without tachypnea nor retractions. Breath sounds are clear and equal bilaterally. No wheezes/rales/rhonchi. Gastrointestinal: Soft and non tender. No rebound. No guarding.  Genitourinary: Deferred Musculoskeletal: Normal range of motion in all extremities. No lower extremity edema. Neurologic:  Normal speech and language. No gross focal neurologic deficits are appreciated.  Skin:  Skin is warm, dry and intact. No rash noted. Psychiatric: Mood and affect are normal. Speech and behavior are normal. Patient exhibits appropriate insight and judgment.  ____________________________________________    LABS (pertinent positives/negatives)  None  ____________________________________________   EKG  None  ____________________________________________    RADIOLOGY  CT head/max face/c-spine No acute osseous injury. No intracranial bleed.  ____________________________________________   PROCEDURES  Procedures  ____________________________________________   INITIAL IMPRESSION / ASSESSMENT AND PLAN / ED COURSE  Pertinent labs & imaging results that were available during my care of the patient were reviewed by me and considered in my medical decision making (see chart for details).   Patient presented to the emergency department today after a fall.  On exam patient had some facial abrasions however no findings for a significant head injury.  However given mechanism of injury and age imaging was performed.  This did not show any acute osseous injury or  intracranial bleed.  At this point do think is reasonable for patient be discharged home.  ____________________________________________   FINAL CLINICAL IMPRESSION(S) / ED DIAGNOSES  Final diagnoses:  Fall, initial encounter  Abrasion of face, initial encounter     Note: This dictation was prepared with Diplomatic Services operational officer dictation. Any transcriptional errors that result from this process are unintentional     Nance Pear, MD 07/15/18 [phone]

## 2018-07-20 ENCOUNTER — Ambulatory Visit: Admission: RE | Admit: 2018-07-20 | Payer: Medicare Other | Source: Ambulatory Visit

## 2018-08-18 ENCOUNTER — Observation Stay
Admission: EM | Admit: 2018-08-18 | Discharge: 2018-08-21 | Disposition: A | Discharge status: Skilled Nursing Facility | Payer: Medicare Other | Attending: Internal Medicine | Admitting: Internal Medicine

## 2018-08-18 ENCOUNTER — Other Ambulatory Visit: Payer: Self-pay

## 2018-08-18 ENCOUNTER — Encounter: Payer: Self-pay | Admitting: Internal Medicine

## 2018-08-18 ENCOUNTER — Emergency Department: Payer: Medicare Other

## 2018-08-18 DIAGNOSIS — R4781 Slurred speech: Secondary | ICD-10-CM | POA: Diagnosis present

## 2018-08-18 DIAGNOSIS — E039 Hypothyroidism, unspecified: Secondary | ICD-10-CM | POA: Insufficient documentation

## 2018-08-18 DIAGNOSIS — E785 Hyperlipidemia, unspecified: Secondary | ICD-10-CM | POA: Diagnosis not present

## 2018-08-18 DIAGNOSIS — I119 Hypertensive heart disease without heart failure: Secondary | ICD-10-CM | POA: Insufficient documentation

## 2018-08-18 DIAGNOSIS — R739 Hyperglycemia, unspecified: Secondary | ICD-10-CM | POA: Diagnosis present

## 2018-08-18 DIAGNOSIS — F419 Anxiety disorder, unspecified: Secondary | ICD-10-CM | POA: Diagnosis not present

## 2018-08-18 DIAGNOSIS — Z79899 Other long term (current) drug therapy: Secondary | ICD-10-CM | POA: Insufficient documentation

## 2018-08-18 DIAGNOSIS — E538 Deficiency of other specified B group vitamins: Secondary | ICD-10-CM | POA: Insufficient documentation

## 2018-08-18 DIAGNOSIS — Z79890 Hormone replacement therapy (postmenopausal): Secondary | ICD-10-CM | POA: Insufficient documentation

## 2018-08-18 DIAGNOSIS — I248 Other forms of acute ischemic heart disease: Secondary | ICD-10-CM | POA: Insufficient documentation

## 2018-08-18 DIAGNOSIS — R296 Repeated falls: Secondary | ICD-10-CM | POA: Diagnosis not present

## 2018-08-18 DIAGNOSIS — R7989 Other specified abnormal findings of blood chemistry: Secondary | ICD-10-CM | POA: Diagnosis not present

## 2018-08-18 DIAGNOSIS — I2510 Atherosclerotic heart disease of native coronary artery without angina pectoris: Secondary | ICD-10-CM | POA: Diagnosis not present

## 2018-08-18 DIAGNOSIS — G4730 Sleep apnea, unspecified: Secondary | ICD-10-CM | POA: Diagnosis not present

## 2018-08-18 DIAGNOSIS — F329 Major depressive disorder, single episode, unspecified: Secondary | ICD-10-CM | POA: Insufficient documentation

## 2018-08-18 DIAGNOSIS — E1165 Type 2 diabetes mellitus with hyperglycemia: Secondary | ICD-10-CM | POA: Diagnosis not present

## 2018-08-18 DIAGNOSIS — R55 Syncope and collapse: Secondary | ICD-10-CM | POA: Insufficient documentation

## 2018-08-18 DIAGNOSIS — R778 Other specified abnormalities of plasma proteins: Secondary | ICD-10-CM | POA: Diagnosis present

## 2018-08-18 DIAGNOSIS — W19XXXA Unspecified fall, initial encounter: Secondary | ICD-10-CM | POA: Insufficient documentation

## 2018-08-18 DIAGNOSIS — Z955 Presence of coronary angioplasty implant and graft: Secondary | ICD-10-CM | POA: Insufficient documentation

## 2018-08-18 DIAGNOSIS — E860 Dehydration: Secondary | ICD-10-CM | POA: Diagnosis not present

## 2018-08-18 DIAGNOSIS — M25561 Pain in right knee: Secondary | ICD-10-CM | POA: Diagnosis not present

## 2018-08-18 DIAGNOSIS — Z9011 Acquired absence of right breast and nipple: Secondary | ICD-10-CM | POA: Insufficient documentation

## 2018-08-18 DIAGNOSIS — Z9049 Acquired absence of other specified parts of digestive tract: Secondary | ICD-10-CM | POA: Insufficient documentation

## 2018-08-18 DIAGNOSIS — Z853 Personal history of malignant neoplasm of breast: Secondary | ICD-10-CM | POA: Insufficient documentation

## 2018-08-18 DIAGNOSIS — Z794 Long term (current) use of insulin: Secondary | ICD-10-CM | POA: Insufficient documentation

## 2018-08-18 DIAGNOSIS — Z87891 Personal history of nicotine dependence: Secondary | ICD-10-CM | POA: Insufficient documentation

## 2018-08-18 DIAGNOSIS — R0989 Other specified symptoms and signs involving the circulatory and respiratory systems: Secondary | ICD-10-CM | POA: Insufficient documentation

## 2018-08-18 DIAGNOSIS — I495 Sick sinus syndrome: Secondary | ICD-10-CM | POA: Insufficient documentation

## 2018-08-18 DIAGNOSIS — J449 Chronic obstructive pulmonary disease, unspecified: Secondary | ICD-10-CM | POA: Insufficient documentation

## 2018-08-18 DIAGNOSIS — Z85828 Personal history of other malignant neoplasm of skin: Secondary | ICD-10-CM | POA: Insufficient documentation

## 2018-08-18 DIAGNOSIS — I252 Old myocardial infarction: Secondary | ICD-10-CM | POA: Insufficient documentation

## 2018-08-18 DIAGNOSIS — E7800 Pure hypercholesterolemia, unspecified: Secondary | ICD-10-CM | POA: Insufficient documentation

## 2018-08-18 DIAGNOSIS — Z9221 Personal history of antineoplastic chemotherapy: Secondary | ICD-10-CM | POA: Insufficient documentation

## 2018-08-18 DIAGNOSIS — Z950 Presence of cardiac pacemaker: Secondary | ICD-10-CM | POA: Diagnosis not present

## 2018-08-18 DIAGNOSIS — Z7982 Long term (current) use of aspirin: Secondary | ICD-10-CM | POA: Insufficient documentation

## 2018-08-18 DIAGNOSIS — I214 Non-ST elevation (NSTEMI) myocardial infarction: Principal | ICD-10-CM | POA: Insufficient documentation

## 2018-08-18 LAB — URINALYSIS, COMPLETE (UACMP) WITH MICROSCOPIC
Bacteria, UA: NONE SEEN
Bilirubin Urine: NEGATIVE
Hgb urine dipstick: NEGATIVE
KETONES UR: NEGATIVE mg/dL
Leukocytes, UA: NEGATIVE
Nitrite: NEGATIVE
PROTEIN: 100 mg/dL — AB
Specific Gravity, Urine: 1.02 (ref 1.005–1.030)
pH: 7 (ref 5.0–8.0)

## 2018-08-18 LAB — CBC
HCT: 42.2 % (ref 36.0–46.0)
Hemoglobin: 13.3 g/dL (ref 12.0–15.0)
MCH: 26.4 pg (ref 26.0–34.0)
MCHC: 31.5 g/dL (ref 30.0–36.0)
MCV: 83.7 fL (ref 80.0–100.0)
NRBC: 0 % (ref 0.0–0.2)
PLATELETS: 273 10*3/uL (ref 150–400)
RBC: 5.04 MIL/uL (ref 3.87–5.11)
RDW: 16.4 % — ABNORMAL HIGH (ref 11.5–15.5)
WBC: 9.9 10*3/uL (ref 4.0–10.5)

## 2018-08-18 LAB — BLOOD GAS, ARTERIAL
Acid-base deficit: 0.9 mmol/L (ref 0.0–2.0)
Bicarbonate: 22 mmol/L (ref 20.0–28.0)
FIO2: 0.21
O2 Saturation: 97.1 %
Patient temperature: 37
pCO2 arterial: 31 mmHg — ABNORMAL LOW (ref 32.0–48.0)
pH, Arterial: 7.46 — ABNORMAL HIGH (ref 7.350–7.450)
pO2, Arterial: 87 mmHg (ref 83.0–108.0)

## 2018-08-18 LAB — URINE DRUG SCREEN, QUALITATIVE (ARMC ONLY)
Amphetamines, Ur Screen: NOT DETECTED
Barbiturates, Ur Screen: NOT DETECTED
Benzodiazepine, Ur Scrn: POSITIVE — AB
COCAINE METABOLITE, UR ~~LOC~~: NOT DETECTED
Cannabinoid 50 Ng, Ur ~~LOC~~: NOT DETECTED
MDMA (ECSTASY) UR SCREEN: NOT DETECTED
Methadone Scn, Ur: NOT DETECTED
OPIATE, UR SCREEN: NOT DETECTED
PHENCYCLIDINE (PCP) UR S: NOT DETECTED
TRICYCLIC, UR SCREEN: NOT DETECTED

## 2018-08-18 LAB — COMPREHENSIVE METABOLIC PANEL
ALBUMIN: 3.9 g/dL (ref 3.5–5.0)
ALT: 13 U/L (ref 0–44)
AST: 17 U/L (ref 15–41)
Alkaline Phosphatase: 99 U/L (ref 38–126)
Anion gap: 10 (ref 5–15)
BUN: 17 mg/dL (ref 8–23)
CHLORIDE: 100 mmol/L (ref 98–111)
CO2: 26 mmol/L (ref 22–32)
Calcium: 8.9 mg/dL (ref 8.9–10.3)
Creatinine, Ser: 0.83 mg/dL (ref 0.44–1.00)
GFR calc Af Amer: >60 mL/min (ref >60)
GFR calc non Af Amer: >60 mL/min (ref >60)
GLUCOSE: 360 mg/dL — AB (ref 70–99)
POTASSIUM: 3.8 mmol/L (ref 3.5–5.1)
SODIUM: 136 mmol/L (ref 135–145)
Total Bilirubin: 0.6 mg/dL (ref 0.3–1.2)
Total Protein: 7.8 g/dL (ref 6.5–8.1)

## 2018-08-18 LAB — TROPONIN I
Troponin I: 0.77 ng/mL (ref <0.03)
Troponin I: 0.57 ng/mL (ref <0.03)
Troponin I: 0.63 ng/mL (ref <0.03)

## 2018-08-18 LAB — GLUCOSE, CAPILLARY
Glucose-Capillary: 237 mg/dL — ABNORMAL HIGH (ref 70–99)
Glucose-Capillary: 304 mg/dL — ABNORMAL HIGH (ref 70–99)
Glucose-Capillary: 276 mg/dL — ABNORMAL HIGH (ref 70–99)

## 2018-08-18 LAB — PROTIME-INR
INR: 0.99
PROTHROMBIN TIME: 13 s (ref 11.4–15.2)

## 2018-08-18 LAB — CK: Total CK: 136 U/L (ref 38–234)

## 2018-08-18 LAB — AMMONIA: Ammonia: 14 umol/L (ref 9–35)

## 2018-08-18 LAB — HEPARIN LEVEL (UNFRACTIONATED): Heparin Unfractionated: <0.1 IU/mL — ABNORMAL LOW (ref 0.30–0.70)

## 2018-08-18 LAB — APTT: aPTT: 28 seconds (ref 24–36)

## 2018-08-18 MED ORDER — HEPARIN BOLUS VIA INFUSION
3600.0000 [IU] | Freq: Once | INTRAVENOUS | Status: AC
  Administered 2018-08-18: 3600 [IU] via INTRAVENOUS
  Filled 2018-08-18: qty 3600

## 2018-08-18 MED ORDER — GABAPENTIN 300 MG PO CAPS
300.0000 mg | ORAL_CAPSULE | Freq: Every day | ORAL | Status: DC
  Administered 2018-08-18 – 2018-08-20 (×3): 300 mg via ORAL
  Filled 2018-08-18 (×3): qty 1

## 2018-08-18 MED ORDER — ATORVASTATIN CALCIUM 20 MG PO TABS: 40.0000 mg | ORAL_TABLET | Freq: Every day | ORAL | Status: DC

## 2018-08-18 MED ORDER — ONDANSETRON HCL 4 MG/2ML IJ SOLN: 4.0000 mg | Freq: Four times a day (QID) | INTRAMUSCULAR | Status: DC | PRN

## 2018-08-18 MED ORDER — TRAMADOL HCL 50 MG PO TABS
50.0000 mg | ORAL_TABLET | Freq: Once | ORAL | Status: AC
  Administered 2018-08-18: 50 mg via ORAL

## 2018-08-18 MED ORDER — SODIUM CHLORIDE 0.9% FLUSH: 3.0000 mL | INTRAVENOUS | Status: DC | PRN

## 2018-08-18 MED ORDER — METOPROLOL TARTRATE 5 MG/5ML IV SOLN
10.0000 mg | Freq: Once | INTRAVENOUS | Status: AC
  Administered 2018-08-18: 10 mg via INTRAVENOUS
  Filled 2018-08-18: qty 10

## 2018-08-18 MED ORDER — DIAZEPAM 2 MG PO TABS
2.0000 mg | ORAL_TABLET | Freq: Two times a day (BID) | ORAL | Status: DC
  Administered 2018-08-18 – 2018-08-19 (×2): 2 mg via ORAL
  Filled 2018-08-18 (×2): qty 1

## 2018-08-18 MED ORDER — LISINOPRIL 20 MG PO TABS
20.0000 mg | ORAL_TABLET | Freq: Every day | ORAL | Status: DC
  Administered 2018-08-18 – 2018-08-21 (×4): 20 mg via ORAL
  Filled 2018-08-18 (×4): qty 1

## 2018-08-18 MED ORDER — TRAMADOL HCL 50 MG PO TABS
ORAL_TABLET | ORAL | Status: AC
  Filled 2018-08-18: qty 1

## 2018-08-18 MED ORDER — SODIUM CHLORIDE 0.9 % IV SOLN
INTRAVENOUS | Status: DC
  Administered 2018-08-18 – 2018-08-19 (×3): via INTRAVENOUS

## 2018-08-18 MED ORDER — ROPINIROLE HCL 1 MG PO TABS
5.0000 mg | ORAL_TABLET | Freq: Every day | ORAL | Status: DC
  Administered 2018-08-18 – 2018-08-20 (×3): 5 mg via ORAL
  Filled 2018-08-18 (×3): qty 5

## 2018-08-18 MED ORDER — INSULIN ASPART 100 UNIT/ML ~~LOC~~ SOLN
10.0000 [IU] | Freq: Once | SUBCUTANEOUS | Status: AC
  Administered 2018-08-18: 10 [IU] via INTRAVENOUS
  Filled 2018-08-18: qty 1

## 2018-08-18 MED ORDER — ACETAMINOPHEN 325 MG PO TABS: 650.0000 mg | ORAL_TABLET | ORAL | Status: DC | PRN

## 2018-08-18 MED ORDER — VENLAFAXINE HCL ER 75 MG PO CP24
75.0000 mg | ORAL_CAPSULE | Freq: Every day | ORAL | Status: DC
  Administered 2018-08-18 – 2018-08-21 (×4): 75 mg via ORAL
  Filled 2018-08-18 (×4): qty 1

## 2018-08-18 MED ORDER — ASPIRIN 81 MG PO CHEW
324.0000 mg | CHEWABLE_TABLET | Freq: Once | ORAL | Status: AC
  Administered 2018-08-18: 324 mg via ORAL
  Filled 2018-08-18: qty 4

## 2018-08-18 MED ORDER — DARIFENACIN HYDROBROMIDE ER 7.5 MG PO TB24
7.5000 mg | ORAL_TABLET | Freq: Every day | ORAL | Status: DC
  Administered 2018-08-19 – 2018-08-20 (×2): 7.5 mg via ORAL
  Filled 2018-08-18 (×5): qty 1

## 2018-08-18 MED ORDER — ASPIRIN 300 MG RE SUPP: 300.0000 mg | RECTAL | Status: DC

## 2018-08-18 MED ORDER — ADULT MULTIVITAMIN W/MINERALS CH
1.0000 | ORAL_TABLET | Freq: Every day | ORAL | Status: DC
  Administered 2018-08-18 – 2018-08-21 (×4): 1 via ORAL
  Filled 2018-08-18 (×4): qty 1

## 2018-08-18 MED ORDER — LEVOTHYROXINE SODIUM 112 MCG PO TABS
112.0000 ug | ORAL_TABLET | Freq: Every day | ORAL | Status: DC
  Administered 2018-08-18 – 2018-08-21 (×4): 112 ug via ORAL
  Filled 2018-08-18 (×4): qty 1

## 2018-08-18 MED ORDER — HEPARIN (PORCINE) 25000 UT/250ML-% IV SOLN
700.0000 [IU]/h | INTRAVENOUS | Status: DC
  Administered 2018-08-18: 700 [IU]/h via INTRAVENOUS
  Filled 2018-08-18 (×2): qty 250

## 2018-08-18 MED ORDER — SODIUM CHLORIDE 0.9% FLUSH
3.0000 mL | Freq: Two times a day (BID) | INTRAVENOUS | Status: DC
  Administered 2018-08-18 – 2018-08-21 (×3): 3 mL via INTRAVENOUS

## 2018-08-18 MED ORDER — ASPIRIN 81 MG PO CHEW: 324.0000 mg | CHEWABLE_TABLET | ORAL | Status: DC

## 2018-08-18 MED ORDER — HYDRALAZINE HCL 20 MG/ML IJ SOLN
10.0000 mg | INTRAMUSCULAR | Status: DC | PRN
  Administered 2018-08-18: 10 mg via INTRAVENOUS
  Filled 2018-08-18: qty 1

## 2018-08-18 MED ORDER — ASPIRIN EC 81 MG PO TBEC
81.0000 mg | DELAYED_RELEASE_TABLET | Freq: Every day | ORAL | Status: DC
  Administered 2018-08-19 – 2018-08-21 (×3): 81 mg via ORAL
  Filled 2018-08-18 (×3): qty 1

## 2018-08-18 MED ORDER — NITROGLYCERIN 0.4 MG SL SUBL: 0.4000 mg | SUBLINGUAL_TABLET | SUBLINGUAL | Status: DC | PRN

## 2018-08-18 MED ORDER — ROPINIROLE HCL 1 MG PO TABS: 5.0000 mg | ORAL_TABLET | Freq: Once | ORAL | Status: DC

## 2018-08-18 MED ORDER — CITALOPRAM HYDROBROMIDE 20 MG PO TABS
40.0000 mg | ORAL_TABLET | Freq: Every day | ORAL | Status: DC
  Administered 2018-08-18 – 2018-08-20 (×3): 40 mg via ORAL
  Filled 2018-08-18 (×3): qty 2

## 2018-08-18 MED ORDER — INSULIN ASPART 100 UNIT/ML ~~LOC~~ SOLN
0.0000 [IU] | Freq: Every day | SUBCUTANEOUS | Status: DC
  Administered 2018-08-18 – 2018-08-19 (×2): 3 [IU] via SUBCUTANEOUS
  Administered 2018-08-20: 2 [IU] via SUBCUTANEOUS
  Filled 2018-08-18 (×3): qty 1

## 2018-08-18 MED ORDER — SODIUM CHLORIDE 0.9 % IV BOLUS
1000.0000 mL | Freq: Once | INTRAVENOUS | Status: AC
  Administered 2018-08-18: 1000 mL via INTRAVENOUS

## 2018-08-18 MED ORDER — SODIUM CHLORIDE 0.9 % IV SOLN: 250.0000 mL | INTRAVENOUS | Status: DC | PRN

## 2018-08-18 MED ORDER — METOPROLOL TARTRATE 25 MG PO TABS
25.0000 mg | ORAL_TABLET | Freq: Two times a day (BID) | ORAL | Status: DC
  Administered 2018-08-19 – 2018-08-20 (×2): 25 mg via ORAL
  Filled 2018-08-18 (×6): qty 1

## 2018-08-18 MED ORDER — PANTOPRAZOLE SODIUM 40 MG PO TBEC
40.0000 mg | DELAYED_RELEASE_TABLET | Freq: Every day | ORAL | Status: DC
  Administered 2018-08-18 – 2018-08-21 (×4): 40 mg via ORAL
  Filled 2018-08-18 (×4): qty 1

## 2018-08-18 MED ORDER — VITAMIN D3 25 MCG (1000 UNIT) PO TABS
2000.0000 [IU] | ORAL_TABLET | Freq: Every day | ORAL | Status: DC
  Administered 2018-08-18 – 2018-08-20 (×3): 2000 [IU] via ORAL
  Filled 2018-08-18 (×5): qty 2

## 2018-08-18 MED ORDER — INSULIN ASPART 100 UNIT/ML ~~LOC~~ SOLN
0.0000 [IU] | Freq: Three times a day (TID) | SUBCUTANEOUS | Status: DC
  Administered 2018-08-18 – 2018-08-19 (×2): 8 [IU] via SUBCUTANEOUS
  Administered 2018-08-19: 3 [IU] via SUBCUTANEOUS
  Administered 2018-08-20: 5 [IU] via SUBCUTANEOUS
  Administered 2018-08-20: 2 [IU] via SUBCUTANEOUS
  Administered 2018-08-20 – 2018-08-21 (×2): 5 [IU] via SUBCUTANEOUS
  Administered 2018-08-21: 3 [IU] via SUBCUTANEOUS
  Filled 2018-08-18 (×9): qty 1

## 2018-08-18 MED ORDER — ROSUVASTATIN CALCIUM 10 MG PO TABS
10.0000 mg | ORAL_TABLET | Freq: Every day | ORAL | Status: DC
  Administered 2018-08-18 – 2018-08-20 (×3): 10 mg via ORAL
  Filled 2018-08-18 (×3): qty 1

## 2018-08-18 NOTE — ED Notes (Signed)
Sent redraw to lab. 

## 2018-08-18 NOTE — Consult Note (Signed)
[facility] Cardiology  CARDIOLOGY CONSULT NOTE  Patient ID: Cassandra [name] MRN: [id number] DOB/AGE: Sep 07, 1932 84 y.o.  Admit date: 08/18/2018 Referring Physician [place] Primary Physician [facility] Primary Cardiologist Fath Reason for Consultation Elevated troponin  HPI: 84 year old female referred for evaluation of elevated troponin.  The patient has a history of known severe two-vessel coronary artery disease, status post stent of LAD with residual occluded RCA with collaterals in 2017, hypertension, carotid artery disease, COPD, type 2 diabetes, sick sinus syndrome status post pacemaker, and sleep apnea, with minimal use of CPAP.  The patient sustained a mechanical fall off of a high stool early this morning, and felt too weak to stand up.  Of note, the patient had reportedly taken Valium and drink a Margarita the previous evening.  The patient was noted to have some slurred speech, which had resolved by the time she presented to the ER.  ECG revealed no evidence of ischemia.  Chest x-ray was unremarkable.  Troponin was borderline elevated to 0.77, followed by 0.57.  The patient was started on heparin drip.  The patient denies any chest pain. She has chronic exertional shortness of breath. Her husband at the bedside reports that he has noticed for the past month that she falls asleep easily. She uses her CPAP once in a while, he reports. She denies any syncope. She reports dizziness with bending forward for which she was recently prescribed Valium. Currently, the patient reports feeling "fine."  Review of systems complete and found to be negative unless listed above     Past Medical History:  Diagnosis Date  . Acquired hypothyroidism 01/06/2014  . Anxiety   . Arthritis   . B12 deficiency 02/15/2014  . Benign essential hypertension 01/06/2014  . Breast cancer [facility]) 1982   Right breast cancer - chemotherapy  . CAD (coronary artery disease), native coronary artery 01/06/2014  . Carotid artery  calcification   . Chronic airway obstruction, not elsewhere classified 01/06/2014  . Depression   . Diabetes mellitus without complication ([place])   . History of kidney stones 2019   left ureteral stone  . Incontinence of urine   . Myocardial infarction ([place]) 1982   small infarct  . Pacemaker   . Presence of permanent cardiac pacemaker 07/2016  . Pure hypercholesterolemia 01/06/2014  . Renal insufficiency   . Skin cancer   . Sleep apnea    uses cpap    Past Surgical History:  Procedure Laterality Date  . APPENDECTOMY    . AUGMENTATION MAMMAPLASTY Bilateral 1982/redo in 2014   prior mastectomy  . CARDIAC CATHETERIZATION N/A 04/23/2016   Procedure: Left Heart Cath and Coronary Angiography;  Surgeon: Corey Skains, MD;  Location: [place] CV [facility];  Service: Cardiovascular;  Laterality: N/A;  . CARDIAC CATHETERIZATION Left 04/23/2016   Procedure: Coronary Stent Intervention;  Surgeon: Yolonda Kida, MD;  Location: [place] [facility];  Service: Cardiovascular;  Laterality: Left;  . CAROTID ARTERY ANGIOPLASTY Left 2010   had stent inserted and removed d/t infection. artery from leg inserted in left carotid  . CAROTID ENDARTERECTOMY Left 2010  . CHOLECYSTECTOMY    . CORONARY ANGIOPLASTY WITH STENT PLACEMENT  september 9th 2017  . CYSTOSCOPY W/ URETERAL STENT PLACEMENT Left 08/17/2017   Procedure: CYSTOSCOPY WITH RETROGRADE PYELOGRAM/URETERAL STENT PLACEMENT;  Surgeon: Festus Aloe, MD;  Location: [facility] OR[facility];  Service: Urology;  Laterality: Left;  . CYSTOSCOPY W/ URETERAL STENT PLACEMENT Left 09/16/2017   Procedure: CYSTOSCOPY WITH STENT REPLACEMENT;  Surgeon: Abbie Sons,  MD;  Location: [facility] OR[facility];  Service: Urology;  Laterality: Left;  . CYSTOSCOPY/RETROGRADE/URETEROSCOPY/STONE EXTRACTION WITH BASKET Left 09/16/2017   Procedure: CYSTOSCOPY/RETROGRADE/URETEROSCOPY/STONE EXTRACTION WITH BASKET;  Surgeon: Abbie Sons, MD;  Location: [facility] OR[facility];  Service: Urology;  Laterality:  Left;  . EYE SURGERY Bilateral    cataract extraction  . KYPHOPLASTY N/A 02/03/2018   Procedure: [id number];  Surgeon: Hessie Knows, MD;  Location: [facility] ORS;  Service: Orthopedics;  Laterality: N/A;  . MASTECTOMY Right 1982  . PACEMAKER INSERTION Left 07/03/2016   Procedure: INSERTION PACEMAKER;  Surgeon: Isaias Cowman, MD;  Location: [facility] ORS;  Service: Cardiovascular;  Laterality: Left;  . SKIN CANCER EXCISION    . TUBAL LIGATION      Medications Prior to Admission  Medication Sig Dispense Refill Last Dose  . aspirin EC 81 MG tablet Take 81 mg by mouth daily.   08/17/2018 at 0800  . Cholecalciferol (VITAMIN D3) 2000 units TABS Take 2,000 Units by mouth daily.   08/17/2018 at 0800  . citalopram (CELEXA) 40 MG tablet TAKE ONE TABLET BY MOUTH EVERY NIGHT AT BEDTIME.   08/17/2018 at 2100  . diazepam (VALIUM) 2 MG tablet Take 2 mg by mouth 2 (two) times daily.   08/17/2018 at 2100  . furosemide (LASIX) 40 MG tablet TAKE ONE TABLET TWICE DAILY   08/17/2018 at 0800  . HUMALOG KWIKPEN 100 UNIT/ML KiwkPen Inject 12 Units into the skin 3 (three) times daily. *Use as directed per sliding scale*   08/17/2018 at 0800  . insulin glargine (LANTUS) 100 UNIT/ML injection Inject 0.42 mLs (42 Units total) into the skin at bedtime. (Patient taking differently: Inject 50 Units into the skin at bedtime. ) 0.42 mL 0 08/17/2018 at 2100  . levothyroxine (SYNTHROID, LEVOTHROID) 112 MCG tablet Take 1 tablet (112 mcg total) by mouth daily before breakfast. 30 tablet 0 08/17/2018 at 0800  . LORazepam (ATIVAN) 1 MG tablet TAKE ONE TABLET BY MOUTH AT BEDTIME   08/17/2018 at 2100  . metFORMIN (GLUCOPHAGE-XR) 500 MG 24 hr tablet Take 2 tablets (1,000 mg total) by mouth 2 (two) times daily. Please start this on 8/24 (Patient taking differently: Take 1,000 mg by mouth 2 (two) times daily. Pl) 60 tablet 0 08/17/2018 at 2100  . metoprolol succinate (TOPROL-XL) 50 MG 24 hr tablet Take 1 tablet (50 mg total) by mouth  daily. Take with or immediately following a meal. (Patient taking differently: Take 25 mg by mouth daily. Take with or immediately following a meal.) 30 tablet 0 08/17/2018 at 0800  . Multiple Vitamin (MULTIVITAMIN WITH MINERALS) TABS tablet Take 1 tablet by mouth daily. 30 tablet 0 08/17/2018 at 0800  . nitroGLYCERIN (NITROSTAT) 0.4 MG SL tablet Place 1 tablet (0.4 mg total) under the tongue every 5 (five) minutes as needed for chest pain. 90 tablet 0 prn at prn  . omeprazole (PRILOSEC) 40 MG capsule Take 40 mg by mouth daily.    08/17/2018 at 0800  . ropinirole (REQUIP) 5 MG tablet Take 5 mg by mouth at bedtime.    08/17/2018 at 2100  . solifenacin (VESICARE) 10 MG tablet Take 10 mg by mouth daily.    08/17/2018 at 2100  . venlafaxine XR (EFFEXOR-XR) 75 MG 24 hr capsule Take 75 mg by mouth daily.   08/17/2018 at 2100  . gabapentin (NEURONTIN) 300 MG capsule Take by mouth.   Not Taking at Unknown time  . HYDROcodone-acetaminophen (NORCO/VICODIN) 5-325 MG tablet    Not Taking at Unknown time  .  nystatin (MYCOSTATIN) 100000 UNIT/ML suspension    Not Taking at Unknown time  . rosuvastatin (CRESTOR) 10 MG tablet Take 10 mg by mouth at bedtime.    Not Taking at Unknown time  . sucralfate (CARAFATE) 1 GM/10ML suspension Take by mouth.   Not Taking at Unknown time  . tiotropium (SPIRIVA) 18 MCG inhalation capsule Place 1 capsule (18 mcg total) into inhaler and inhale every morning. (Patient not taking: Reported on 08/18/2018) 30 capsule 12 Not Taking at Unknown time   Social History   Socioeconomic History  . Marital status: Married    Spouse name: Not on file  . Number of children: Not on file  . Years of education: Not on file  . Highest education level: Not on file  Occupational History  . Not on file  Social Needs  . Financial resource strain: Not on file  . Food insecurity:    Worry: Not on file    Inability: Not on file  . Transportation needs:    Medical: Not on file    Non-medical:  Not on file  Tobacco Use  . Smoking status: Former Smoker    Packs/day: 0.50    Types: Cigarettes    Last attempt to quit: 09/02/1996    Years since quitting: 21.9  . Smokeless tobacco: Never Used  Substance and Sexual Activity  . Alcohol use: Yes    Alcohol/week: 1.0 standard drinks    Types: 1 Glasses of wine per week    Comment: occasionally  . Drug use: No  . Sexual activity: Not on file  Lifestyle  . Physical activity:    Days per week: Not on file    Minutes per session: Not on file  . Stress: Not on file  Relationships  . Social connections:    Talks on phone: Not on file    Gets together: Not on file    Attends religious service: Not on file    Active member of club or organization: Not on file    Attends meetings of clubs or organizations: Not on file    Relationship status: Not on file  . Intimate partner violence:    Fear of current or ex partner: Not on file    Emotionally abused: Not on file    Physically abused: Not on file    Forced sexual activity: Not on file  Other Topics Concern  . Not on file  Social History Narrative  . Not on file    Family History  Problem Relation Age of Onset  . Prostate cancer Neg Hx   . Kidney cancer Neg Hx   . Breast cancer Neg Hx       Review of systems complete and found to be negative unless listed above      PHYSICAL EXAM  General: Well developed, well nourished, in no acute distress HEENT:  Normocephalic and atramatic Neck:  No JVD.  Lungs: Clear bilaterally to auscultation/. Normal effort of breathing on room air. Heart: HRRR . Normal S1 and S2 without gallops or murmurs.  Abdomen: nondistended  Msk:  Back normal, gait not assessed. Extremities:  1+ bilateral lower extremity edema. Neuro: Alert and oriented X 3. Psych:  Good affect, responds appropriately. Drowsy.   Labs:   Lab Results  Component Value Date   WBC 9.9 08/18/2018   HGB 13.3 08/18/2018   HCT 42.2 08/18/2018   MCV 83.7 08/18/2018    PLT 273 08/18/2018    Recent Labs  Lab 08/18/18  1127  NA 136  K 3.8  CL 100  CO2 26  BUN 17  CREATININE 0.83  CALCIUM 8.9  PROT 7.8  BILITOT 0.6  ALKPHOS 99  ALT 13  AST 17  GLUCOSE 360*   Lab Results  Component Value Date   CKTOTAL 136 08/18/2018   TROPONINI 0.57 ([place]) 08/18/2018    Lab Results  Component Value Date   CHOL 112 11/08/2017   CHOL 87 06/15/2016   Lab Results  Component Value Date   HDL 36 (L) 11/08/2017   HDL 29 (L) 06/15/2016   Lab Results  Component Value Date   LDLCALC 57 11/08/2017   LDLCALC 28 06/15/2016   Lab Results  Component Value Date   TRIG 93 11/08/2017   TRIG 150 (H) 06/15/2016   Lab Results  Component Value Date   CHOLHDL 3.1 11/08/2017   CHOLHDL 3.0 06/15/2016   No results found for: LDLDIRECT    Radiology: Dg Chest 2 View  Result Date: 08/18/2018 CLINICAL DATA:  Status post fall.  Fell from standing. EXAM: CHEST - 2 VIEW COMPARISON:  02/03/2018 FINDINGS: There is mild bilateral interstitial thickening. There is a trace pleural effusion bilaterally. There is no focal consolidation or pneumothorax. There is mild stable cardiomegaly. There is a dual lead cardiac pacemaker. The heart and mediastinal contours are unremarkable. The osseous structures are unremarkable. IMPRESSION: Cardiomegaly with mild pulmonary vascular congestion. Likely underlying mild chronic interstitial lung disease. Electronically Signed   By: Kathreen Devoid   On: 08/18/2018 12:19   Dg Thoracic Spine 2 View  Result Date: 08/18/2018 CLINICAL DATA:  Fall with back pain. EXAM: THORACIC SPINE 2 VIEWS COMPARISON:  Abdomen and pelvis CT 01/07/2018 FINDINGS: T11 compression fracture and cement augmentation. Remote L1 inferior endplate fracture. No evidence of acute fracture or traumatic malalignment. Posterior mediastinal fat planes are preserved. Chronic interstitial coarsening. Chronic cardiomegaly with dual-chamber pacer. IMPRESSION: 1. No acute finding. 2. Remote  T11 and L1 compression fractures. Electronically Signed   By: Monte Fantasia M.D.   On: 08/18/2018 12:17   Dg Pelvis 1-2 Views  Result Date: 08/18/2018 CLINICAL DATA:  Fall at home.  Confusion. EXAM: PELVIS - 1-2 VIEW COMPARISON:  Abdomen and pelvis CT 01/07/2018 FINDINGS: There is no evidence of pelvic fracture or diastasis. No pelvic bone lesions are seen. Generalized osteopenia. IMPRESSION: Negative. Electronically Signed   By: Monte Fantasia M.D.   On: 08/18/2018 12:18   Ct Head Wo Contrast  Result Date: 08/18/2018 CLINICAL DATA:  Confusion today. Status post fall today. Initial encounter. EXAM: CT HEAD WITHOUT CONTRAST TECHNIQUE: Contiguous axial images were obtained from the base of the skull through the vertex without intravenous contrast. COMPARISON:  Head CT scan 07/15/2018 and 08/04/2017. FINDINGS: Brain: No evidence of acute infarction, hemorrhage, hydrocephalus, extra-axial collection or mass lesion/mass effect. There is some atrophy and chronic microvascular ischemic change. Vascular: Atherosclerosis noted. Skull: Intact.  No focal lesion. Sinuses/Orbits: Negative. Other: None. IMPRESSION: No acute abnormality. Electronically Signed   By: Inge Rise M.D.   On: 08/18/2018 11:50    EKG: Sinus rhythm  ASSESSMENT AND PLAN:  1. Elevated troponin in the absence of chest pain. Likely demand supply ischemia, not ACS. Troponin 0.77, followed by 0.57. On heparin drip. ECG  2. Known severe 2-vessel CAD, status post PCI of LAD in 2017 with residual occluded RCA with collaterals in 2017, on aspirin 3. Hypertension 4. Frequent falls with recent mechanical fall off of high stool, after taking Valium and drinking  alcohol 5. Sleep apnea, with minimal CPAP use with recent history of falling asleep easily throughout the day 6. Sick sinus syndrome, status post pacemaker with interrogation in the ER  Recommendations: 1. Discontinue heparin drip 2. 2D echocardiogram 3. Recommend CPAP use at  night 4. Continue to trend cardiac enzymes  Signed: Clabe Seal PA-C 08/18/2018, 5:15 PM

## 2018-08-18 NOTE — Progress Notes (Signed)
[place] for heparin Indication: chest pain/ACS  No Known Allergies  Patient Measurements: Height: 5' (152.4 cm) Weight: 160 lb (72.6 kg) IBW/kg (Calculated) : 45.5 Heparin Dosing Weight: 61.6 kg  Vital Signs: Temp: 97.7 F (36.5 C) (12/17 1123) Temp Source: Oral (12/17 1123) BP: 168/86 (12/17 1331) Pulse Rate: 66 (12/17 1332)  Labs: Recent Labs    08/18/18 1127  HGB 13.3  HCT 42.2  PLT 273  CREATININE 0.83  CKTOTAL 136  TROPONINI 0.77*    Estimated Creatinine Clearance: 45.6 mL/min (by C-G formula based on SCr of 0.83 mg/dL).   Medical History: Past Medical History:  Diagnosis Date  . Acquired hypothyroidism 01/06/2014  . Anxiety   . Arthritis   . B12 deficiency 02/15/2014  . Benign essential hypertension 01/06/2014  . Breast cancer [facility]) 1982   Right breast cancer - chemotherapy  . CAD (coronary artery disease), native coronary artery 01/06/2014  . Carotid artery calcification   . Chronic airway obstruction, not elsewhere classified 01/06/2014  . Depression   . Diabetes mellitus without complication ([place])   . History of kidney stones 2019   left ureteral stone  . Incontinence of urine   . Myocardial infarction ([place]) 1982   small infarct  . Pacemaker   . Presence of permanent cardiac pacemaker 07/2016  . Pure hypercholesterolemia 01/06/2014  . Renal insufficiency   . Skin cancer   . Sleep apnea    uses cpap   Assessment: 84 year old female admitted with a fall. Initial troponin 0.77. No anticoagulation PTA.  Goal of Therapy:  Heparin level 0.3-0.7 units/ml Monitor platelets by anticoagulation protocol: Yes   Plan:  Will give heparin 3600 unit bolus followed by heparin drip at 700 units/hr. Heparin level ordered for 2230. CBC with morning labs.  Tawnya Crook, PharmD Pharmacy Resident  08/18/2018 2:10 PM

## 2018-08-18 NOTE — H&P (Signed)
[facility] [place] NAME: Cassandra [name]    MR#:  [id number]  DATE OF BIRTH:  03-Apr-1933  DATE OF ADMISSION:  08/18/2018  PRIMARY CARE PHYSICIAN: Idelle Crouch, MD   REQUESTING/REFERRING PHYSICIAN:   CHIEF COMPLAINT:   Chief Complaint  Patient presents with  . Fall  . Knee Pain    HISTORY OF PRESENT ILLNESS: Cassandra [name]  is a 84 y.o. female with a known history of hypothyroidism, B12 deficiency, hypertension, breast cancer, chemotherapy in the past, coronary disease, type 2 diabetes mellitus, pacemaker presented to the emergency room for fall from a high stool.  Patient fell down from high stool and was on the floor when her husband checked on her this morning.  He was very weak and unable to stand up.  Patient noticed some slurred speech this morning but mental status was normal.  When patient presented to the emergency room her speech has been normal.  She has been weak and had frequent falls for the last couple of months.  No history of any head injury.  Patient had margarita last evening along with Valium.  She was worked up with CT head which showed no acute abnormality.  During the work-up in the emergency room her troponin is elevated.  CK is only 136.  No complaints of any chest pain.  EKG showed no ST segment changes.  Pacemaker was interrogated.  Hospitalist service was consulted for further care.  X-ray of the pelvis and lumbar thoracic spine did not show any acute abnormalities.  Chest x-ray did not reveal any pneumonia.  PAST MEDICAL HISTORY:   Past Medical History:  Diagnosis Date  . Acquired hypothyroidism 01/06/2014  . Anxiety   . Arthritis   . B12 deficiency 02/15/2014  . Benign essential hypertension 01/06/2014  . Breast cancer [facility]) 1982   Right breast cancer - chemotherapy  . CAD (coronary artery disease), native coronary artery 01/06/2014  . Carotid artery calcification   . Chronic airway obstruction, not  elsewhere classified 01/06/2014  . Depression   . Diabetes mellitus without complication ([place])   . History of kidney stones 2019   left ureteral stone  . Incontinence of urine   . Myocardial infarction ([place]) 1982   small infarct  . Pacemaker   . Presence of permanent cardiac pacemaker 07/2016  . Pure hypercholesterolemia 01/06/2014  . Renal insufficiency   . Skin cancer   . Sleep apnea    uses cpap    PAST SURGICAL HISTORY:  Past Surgical History:  Procedure Laterality Date  . APPENDECTOMY    . AUGMENTATION MAMMAPLASTY Bilateral 1982/redo in 2014   prior mastectomy  . CARDIAC CATHETERIZATION N/A 04/23/2016   Procedure: Left Heart Cath and Coronary Angiography;  Surgeon: Corey Skains, MD;  Location: [place] [facility];  Service: Cardiovascular;  Laterality: N/A;  . CARDIAC CATHETERIZATION Left 04/23/2016   Procedure: Coronary Stent Intervention;  Surgeon: Yolonda Kida, MD;  Location: [place] [facility];  Service: Cardiovascular;  Laterality: Left;  . CAROTID ARTERY ANGIOPLASTY Left 2010   had stent inserted and removed d/t infection. artery from leg inserted in left carotid  . CAROTID ENDARTERECTOMY Left 2010  . CHOLECYSTECTOMY    . CORONARY ANGIOPLASTY WITH STENT PLACEMENT  september 9th 2017  . CYSTOSCOPY W/ URETERAL STENT PLACEMENT Left 08/17/2017   Procedure: CYSTOSCOPY WITH RETROGRADE PYELOGRAM/URETERAL STENT PLACEMENT;  Surgeon: Festus Aloe, MD;  Location: [facility];  Service: Urology;  Laterality:  Left;  . CYSTOSCOPY W/ URETERAL STENT PLACEMENT Left 09/16/2017   Procedure: CYSTOSCOPY WITH STENT REPLACEMENT;  Surgeon: Abbie Sons, MD;  Location: [facility];  Service: Urology;  Laterality: Left;  . CYSTOSCOPY/RETROGRADE/URETEROSCOPY/STONE EXTRACTION WITH BASKET Left 09/16/2017   Procedure: CYSTOSCOPY/RETROGRADE/URETEROSCOPY/STONE EXTRACTION WITH BASKET;  Surgeon: Abbie Sons, MD;  Location: [facility];  Service: Urology;  Laterality: Left;  . EYE SURGERY  Bilateral    cataract extraction  . KYPHOPLASTY N/A 02/03/2018   Procedure: [id number];  Surgeon: Hessie Knows, MD;  Location: [facility];  Service: Orthopedics;  Laterality: N/A;  . MASTECTOMY Right 1982  . PACEMAKER INSERTION Left 07/03/2016   Procedure: INSERTION PACEMAKER;  Surgeon: Isaias Cowman, MD;  Location: [facility];  Service: Cardiovascular;  Laterality: Left;  . SKIN CANCER EXCISION    . TUBAL LIGATION      SOCIAL HISTORY:  Social History   Tobacco Use  . Smoking status: Former Smoker    Packs/day: 0.50    Types: Cigarettes    Last attempt to quit: 09/02/1996    Years since quitting: 21.9  . Smokeless tobacco: Never Used  Substance Use Topics  . Alcohol use: Yes    Alcohol/week: 1.0 standard drinks    Types: 1 Glasses of wine per week    Comment: occasionally    FAMILY HISTORY:  Family History  Problem Relation Age of Onset  . Prostate cancer Neg Hx   . Kidney cancer Neg Hx   . Breast cancer Neg Hx     DRUG ALLERGIES: No Known Allergies  REVIEW OF SYSTEMS:   CONSTITUTIONAL: No fever,has fatigue and weakness.  EYES: No blurred or double vision.  EARS, NOSE, AND THROAT: No tinnitus or ear pain.  RESPIRATORY: No cough, shortness of breath, wheezing or hemoptysis.  CARDIOVASCULAR: No chest pain, orthopnea, edema.  GASTROINTESTINAL: No nausea, vomiting, diarrhea or abdominal pain.  GENITOURINARY: No dysuria, hematuria.  ENDOCRINE: No polyuria, nocturia,  HEMATOLOGY: No anemia, easy bruising or bleeding SKIN: No rash or lesion. MUSCULOSKELETAL: No joint pain or arthritis.   Has muscle cramps NEUROLOGIC: No tingling, numbness, weakness.  PSYCHIATRY: No anxiety or depression.   MEDICATIONS AT HOME:  Prior to Admission medications   Medication Sig Start Date End Date Taking? Authorizing Provider  aspirin EC 81 MG tablet Take 81 mg by mouth daily.   Yes [provider]  Cholecalciferol (VITAMIN D3) 2000 units TABS Take 2,000 Units by mouth  daily.   Yes [provider]  citalopram (CELEXA) 40 MG tablet TAKE ONE TABLET BY MOUTH EVERY NIGHT AT BEDTIME. 07/07/14  Yes [provider]  diazepam (VALIUM) 2 MG tablet Take 2 mg by mouth 2 (two) times daily. 08/10/18 09/09/18 Yes [provider]  furosemide (LASIX) 40 MG tablet TAKE ONE TABLET TWICE DAILY 02/16/18  Yes [provider]  HUMALOG KWIKPEN 100 UNIT/ML KiwkPen Inject 12 Units into the skin 3 (three) times daily. *Use as directed per sliding scale* 03/03/15  Yes [provider]  insulin glargine (LANTUS) 100 UNIT/ML injection Inject 0.42 mLs (42 Units total) into the skin at bedtime. Patient taking differently: Inject 50 Units into the skin at bedtime.  06/16/16  Yes Mody, Ulice Bold, MD  levothyroxine (SYNTHROID, LEVOTHROID) 112 MCG tablet Take 1 tablet (112 mcg total) by mouth daily before breakfast. 06/17/16  Yes Mody, Sital, MD  LORazepam (ATIVAN) 1 MG tablet TAKE ONE TABLET BY MOUTH AT BEDTIME 11/19/17  Yes [provider]  metFORMIN (GLUCOPHAGE-XR) 500 MG 24 hr tablet Take  2 tablets (1,000 mg total) by mouth 2 (two) times daily. Please start this on 8/24 Patient taking differently: Take 1,000 mg by mouth 2 (two) times daily. Pl 04/24/16  Yes Mody, Sital, MD  metoprolol succinate (TOPROL-XL) 50 MG 24 hr tablet Take 1 tablet (50 mg total) by mouth daily. Take with or immediately following a meal. Patient taking differently: Take 25 mg by mouth daily. Take with or immediately following a meal. 11/09/17  Yes Gouru, Aruna, MD  Multiple Vitamin (MULTIVITAMIN WITH MINERALS) TABS tablet Take 1 tablet by mouth daily. 09/24/17  Yes Max Sane, MD  nitroGLYCERIN (NITROSTAT) 0.4 MG SL tablet Place 1 tablet (0.4 mg total) under the tongue every 5 (five) minutes as needed for chest pain. 04/24/16  Yes Mody, Ulice Bold, MD  omeprazole (PRILOSEC) 40 MG capsule Take 40 mg by mouth daily.  01/16/18 01/16/19 Yes [provider]  ropinirole (REQUIP) 5 MG  tablet Take 5 mg by mouth at bedtime.  03/29/15  Yes [provider]  solifenacin (VESICARE) 10 MG tablet Take 10 mg by mouth daily.  10/27/17 10/27/18 Yes [provider]  venlafaxine XR (EFFEXOR-XR) 75 MG 24 hr capsule Take 75 mg by mouth daily. 03/30/18 03/30/19 Yes [provider]  gabapentin (NEURONTIN) 300 MG capsule Take by mouth. 02/16/18 02/16/19  [provider]  HYDROcodone-acetaminophen (NORCO/VICODIN) 5-325 MG tablet  01/07/18   [provider]  nystatin (MYCOSTATIN) 100000 UNIT/ML suspension  01/01/18   [provider]  rosuvastatin (CRESTOR) 10 MG tablet Take 10 mg by mouth at bedtime.  03/25/16   [provider]  sucralfate (CARAFATE) 1 GM/10ML suspension Take by mouth. 01/16/18 01/16/19  [provider]  tiotropium (SPIRIVA) 18 MCG inhalation capsule Place 1 capsule (18 mcg total) into inhaler and inhale every morning. Patient not taking: Reported on 08/18/2018 09/23/17   Max Sane, MD      PHYSICAL EXAMINATION:   VITAL SIGNS: Blood pressure (!) 192/69, pulse (!) 59, temperature 97.6 F (36.4 C), temperature source Oral, resp. rate 16, height 5' (1.524 m), weight 72.6 kg, SpO2 96 %.  GENERAL:  84 y.o.-year-old patient lying in the bed with no acute distress.  EYES: Pupils equal, round, reactive to light and accommodation. No scleral icterus. Extraocular muscles intact.  HEENT: Head atraumatic, normocephalic. Oropharynx dry and nasopharynx clear.  NECK:  Supple, no jugular venous distention. No thyroid enlargement, no tenderness.  LUNGS: Normal breath sounds bilaterally, no wheezing, rales,rhonchi or crepitation. No use of accessory muscles of respiration.  CARDIOVASCULAR: S1, S2 normal. No murmurs, rubs, or gallops.  ABDOMEN: Soft, nontender, nondistended. Bowel sounds present. No organomegaly or mass.  EXTREMITIES: No pedal edema, cyanosis, or clubbing.  NEUROLOGIC: Cranial nerves II through XII are intact. Muscle  strength 5/5 in all extremities. Sensation intact. Gait not checked.  PSYCHIATRIC: The patient is alert and oriented x 3.  SKIN: No obvious rash, lesion, or ulcer.   LABORATORY PANEL:   CBC Recent Labs  Lab 08/18/18 1127  WBC 9.9  HGB 13.3  HCT 42.2  PLT 273  MCV 83.7  MCH 26.4  MCHC 31.5  RDW 16.4*   ------------------------------------------------------------------------------------------------------------------  Chemistries  Recent Labs  Lab 08/18/18 1127  NA 136  K 3.8  CL 100  CO2 26  GLUCOSE 360*  BUN 17  CREATININE 0.83  CALCIUM 8.9  AST 17  ALT 13  ALKPHOS 99  BILITOT 0.6   ------------------------------------------------------------------------------------------------------------------ estimated creatinine clearance is 45.6 mL/min (by C-G formula based on SCr of  0.83 mg/dL). ------------------------------------------------------------------------------------------------------------------ No results for input(s): TSH, T4TOTAL, T3FREE, THYROIDAB in the last 72 hours.  Invalid input(s): FREET3   Coagulation profile Recent Labs  Lab 08/18/18 1127  INR 0.99   ------------------------------------------------------------------------------------------------------------------- No results for input(s): DDIMER in the last 72 hours. -------------------------------------------------------------------------------------------------------------------  Cardiac Enzymes Recent Labs  Lab 08/18/18 1127  TROPONINI 0.77*   ------------------------------------------------------------------------------------------------------------------ Invalid input(s): POCBNP  ---------------------------------------------------------------------------------------------------------------  Urinalysis    Component Value Date/Time   COLORURINE STRAW (A) 08/18/2018 1127   APPEARANCEUR CLEAR (A) 08/18/2018 1127   APPEARANCEUR Clear 10/27/2017 1508   LABSPEC 1.020 08/18/2018 1127    LABSPEC 1.008 05/11/2012 1400   PHURINE 7.0 08/18/2018 1127   GLUCOSEU >=500 (A) 08/18/2018 1127   GLUCOSEU Negative 05/11/2012 1400   HGBUR NEGATIVE 08/18/2018 1127   BILIRUBINUR NEGATIVE 08/18/2018 1127   BILIRUBINUR Negative 10/27/2017 1508   BILIRUBINUR Negative 05/11/2012 1400   KETONESUR NEGATIVE 08/18/2018 1127   PROTEINUR 100 (A) 08/18/2018 1127   NITRITE NEGATIVE 08/18/2018 1127   LEUKOCYTESUR NEGATIVE 08/18/2018 1127   LEUKOCYTESUR 1+ (A) 10/27/2017 1508   LEUKOCYTESUR Trace 05/11/2012 1400     RADIOLOGY: Dg Chest 2 View  Result Date: 08/18/2018 CLINICAL DATA:  Status post fall.  Fell from standing. EXAM: CHEST - 2 VIEW COMPARISON:  02/03/2018 FINDINGS: There is mild bilateral interstitial thickening. There is a trace pleural effusion bilaterally. There is no focal consolidation or pneumothorax. There is mild stable cardiomegaly. There is a dual lead cardiac pacemaker. The heart and mediastinal contours are unremarkable. The osseous structures are unremarkable. IMPRESSION: Cardiomegaly with mild pulmonary vascular congestion. Likely underlying mild chronic interstitial lung disease. Electronically Signed   By: Kathreen Devoid   On: 08/18/2018 12:19   Dg Thoracic Spine 2 View  Result Date: 08/18/2018 CLINICAL DATA:  Fall with back pain. EXAM: THORACIC SPINE 2 VIEWS COMPARISON:  Abdomen and pelvis CT 01/07/2018 FINDINGS: T11 compression fracture and cement augmentation. Remote L1 inferior endplate fracture. No evidence of acute fracture or traumatic malalignment. Posterior mediastinal fat planes are preserved. Chronic interstitial coarsening. Chronic cardiomegaly with dual-chamber pacer. IMPRESSION: 1. No acute finding. 2. Remote T11 and L1 compression fractures. Electronically Signed   By: Monte Fantasia M.D.   On: 08/18/2018 12:17   Dg Pelvis 1-2 Views  Result Date: 08/18/2018 CLINICAL DATA:  Fall at home.  Confusion. EXAM: PELVIS - 1-2 VIEW COMPARISON:  Abdomen and pelvis  CT 01/07/2018 FINDINGS: There is no evidence of pelvic fracture or diastasis. No pelvic bone lesions are seen. Generalized osteopenia. IMPRESSION: Negative. Electronically Signed   By: Monte Fantasia M.D.   On: 08/18/2018 12:18   Ct Head Wo Contrast  Result Date: 08/18/2018 CLINICAL DATA:  Confusion today. Status post fall today. Initial encounter. EXAM: CT HEAD WITHOUT CONTRAST TECHNIQUE: Contiguous axial images were obtained from the base of the skull through the vertex without intravenous contrast. COMPARISON:  Head CT scan 07/15/2018 and 08/04/2017. FINDINGS: Brain: No evidence of acute infarction, hemorrhage, hydrocephalus, extra-axial collection or mass lesion/mass effect. There is some atrophy and chronic microvascular ischemic change. Vascular: Atherosclerosis noted. Skull: Intact.  No focal lesion. Sinuses/Orbits: Negative. Other: None. IMPRESSION: No acute abnormality. Electronically Signed   By: Inge Rise M.D.   On: 08/18/2018 11:50    EKG: Orders placed or performed during the hospital encounter of 08/18/18  . ED EKG  . ED EKG    IMPRESSION AND PLAN: 84 year old elderly female patient with history of hypothyroidism, myocardial infarction in the past, coronary disease, diabetes mellitus type  2, hypertension, breast cancer, hyperlipidemia presented to the emergency room for a fall from the high stool.  She had Margarita and Valium last evening.  -Syncope and collapse Admit patient to telemetry IV fluids Cycle troponin Check echocardiogram Telemetry monitoring Pacemaker interrogated in the emergency room  -Elevated troponin Non-STEMI versus demand ischemia Cycle troponin Heparin drip for anticoagulation Cardiology consultation Resume cardiac meds which includes aspirin, beta-blocker  -DVT prophylaxis On full dose anticoagulation with heparin  -History of breast cancer Supportive care  -Dehydration IV fluids  -Uncontrolled hypertension Control blood pressure  with beta-blocker with metoprolol and PRN hydralazine Add Ace inhibitor  -Uncontrolled diabetes mellitus Diabetic diet with sliding scale coverage with insulin Hold metformin for now in view of any further cardiac intervention if needed  All the records are reviewed and case discussed with ED provider. Management plans discussed with the patient, family and they are in agreement.  CODE STATUS:Full code    Code Status Orders  (From admission, onward)         Start     Ordered   08/18/18 1540  Full code  Continuous     08/18/18 1539        Code Status History    Date Active Date Inactive Code Status Order ID Comments User Context   02/03/2018 1644 02/03/2018 2036 Full Code [id number]  Hessie Knows, MD Inpatient   11/07/2017 1658 11/08/2017 1800 Full Code [id number]  Nicholes Mango, MD Inpatient   09/16/2017 1423 09/23/2017 1525 Full Code [id number]  Fritzi Mandes, MD Inpatient   08/17/2017 1958 08/20/2017 1447 Full Code [id number]  Idelle Crouch, MD Inpatient   07/03/2016 1637 07/04/2016 1255 Full Code [id number]  Isaias Cowman, MD Inpatient   06/14/2016 2344 06/16/2016 1358 Full Code [id number]  Harvie Bridge, DO Inpatient   06/07/2016 0128 06/08/2016 1512 Full Code [id number]  Lance Coon, MD Inpatient   04/23/2016 1404 04/24/2016 1435 Full Code [id number]  Yolonda Kida, MD Inpatient   04/21/2016 2018 04/22/2016 0859 Full Code [id number]  Idelle Crouch, MD Inpatient    Advance Directive Documentation     Most Recent Value  Type of Advance Directive  Healthcare Power of Attorney, Living will  Pre-existing out of facility DNR order (yellow form or pink MOST form)  -  "MOST" Form in Place?  -       TOTAL TIME TAKING CARE OF THIS PATIENT:54 minutes.    Saundra Shelling M.D on 08/18/2018 at 3:46 PM  Between 7am to 6pm - Pager - [phone]  After 6pm go to [email or web address] - password [email or web address] [facility] [place] [facility]  Office  [phone]  CC: Primary care  physician; Idelle Crouch, MD

## 2018-08-18 NOTE — ED Notes (Signed)
Date and time results received: 08/18/18 12:19 PM  Test: troponin Critical Value: 0.77  Name of Provider Notified: Dr Mariea Clonts

## 2018-08-18 NOTE — Progress Notes (Signed)
Patient is alert and oriented and then suddenly falls asleep.  She reports that she hasn't used her CPAP machine for about a week.  Discussed with Dr. Estanislado Pandy.  CPAP ordered for tonight.

## 2018-08-18 NOTE — Progress Notes (Signed)
Advanced care plan.  Purpose of the Encounter: CODE STATUS  Parties in Attendance: Patient and family  Patient's Decision Capacity: Good  Subjective/Patient's story: Presented to the emergency room because patient passed out and fell down   Objective/Medical story Patient has elevated troponin Needs cardiology work-up for myocardial infarction She also needs work-up with a CT head to assess for any CVA   Goals of care determination:  Advance care directives goals of care and treatment plan discussed Patient and family want everything done which includes CPR, intubation ventilator if the need arises   CODE STATUS: Full code   Time spent discussing advanced care planning: 16 minutes

## 2018-08-18 NOTE — ED Provider Notes (Addendum)
[facility] Emergency Department Provider Note  ____________________________________________  Time seen: Approximately 11:13 AM  I have reviewed the triage vital signs and the nursing notes.   HISTORY  Chief Complaint Fall and Knee Pain    HPI Cassandra [name] is a 84 y.o. female with a history of CAD status post MI, sick sinus syndrome status post pacemaker, HTN, HL, DM, presenting for fall.  The patient and her husband report that she has been having frequent falls for the last month, as well as daytime somnolence.  They were seen by Dr. [name], their PCP yesterday, and she took her first dose of 2 mg Valium in the evening.  At 3 AM, she was not sleepy so she was working on her Christmas cards at a high table on a tall chair, when the chair fell over and she fell down.  She states that she was too weak to be able to stand up, so she went to sleep.  She did not hit her head, and denies any neck pain although she does have some high back pain without any numbness tingling or weakness.  She did not have any associated chest pain, shortness of breath, palpitations, lightheadedness or syncope.  This morning, her husband woke her up and he was unable to stand her up by himself so EMS was called.  At this time, she has a normal mental status but slurred speech with very dry mouth, which is reportedly unusual for her.  Initially, the patient stated that she had bilateral knee pain, but this has since resolved.  Past Medical History:  Diagnosis Date  . Acquired hypothyroidism 01/06/2014  . Anxiety   . Arthritis   . B12 deficiency 02/15/2014  . Benign essential hypertension 01/06/2014  . Breast cancer [facility]) 1982   Right breast cancer - chemotherapy  . CAD (coronary artery disease), native coronary artery 01/06/2014  . Carotid artery calcification   . Chronic airway obstruction, not elsewhere classified 01/06/2014  . Depression   . Diabetes mellitus without complication ([place])    . History of kidney stones 2019   left ureteral stone  . Incontinence of urine   . Myocardial infarction ([place]) 1982   small infarct  . Pacemaker   . Presence of permanent cardiac pacemaker 07/2016  . Pure hypercholesterolemia 01/06/2014  . Renal insufficiency   . Skin cancer   . Sleep apnea    uses cpap    Patient Active Problem List   Diagnosis Date Noted  . Lymphedema 03/01/2018  . Acute respiratory failure with hypoxia ([place]) 09/16/2017  . Sepsis ([place]) 08/17/2017  . UTI (urinary tract infection) 08/17/2017  . Acute respiratory distress 08/17/2017  . 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 Sick sinus syndrome ([place]) 07/03/2016  . New onset atrial flutter ([place]) 06/14/2016  . Bradycardia, sinus 06/07/2016  . Syncope 06/06/2016  . Chest pain 04/21/2016  . Elevated troponin 04/21/2016  . Labile hypertension 04/21/2016  . Ischemic chest pain ([place]) 04/21/2016  . Long-term insulin use ([place]) 05/24/2014  . Mild vitamin D deficiency 05/20/2014  . B12 deficiency 02/15/2014  . Acquired hypothyroidism 01/06/2014  . Chronic airway obstruction, not elsewhere classified 01/06/2014  . CAD (coronary artery disease), native coronary artery 01/06/2014  . Benign essential hypertension 01/06/2014  . Headache 01/06/2014  . Hypersomnia with sleep apnea 01/06/2014  . Pure hypercholesterolemia 01/06/2014  . Type II diabetes mellitus with manifestations ([place]) 01/06/2014    Past Surgical History:  Procedure Laterality Date  .  APPENDECTOMY    . AUGMENTATION MAMMAPLASTY Bilateral 1982/redo in 2014   prior mastectomy  . CARDIAC CATHETERIZATION N/A 04/23/2016   Procedure: Left Heart Cath and Coronary Angiography;  Surgeon: Corey Skains, MD;  Location: [place] [facility];  Service: Cardiovascular;  Laterality: N/A;  . CARDIAC CATHETERIZATION Left 04/23/2016   Procedure: Coronary Stent Intervention;  Surgeon: Yolonda Kida, MD;  Location: [place] [facility];  Service:  Cardiovascular;  Laterality: Left;  . CAROTID ARTERY ANGIOPLASTY Left 2010   had stent inserted and removed d/t infection. artery from leg inserted in left carotid  . CAROTID ENDARTERECTOMY Left 2010  . CHOLECYSTECTOMY    . CORONARY ANGIOPLASTY WITH STENT PLACEMENT  september 9th 2017  . CYSTOSCOPY W/ URETERAL STENT PLACEMENT Left 08/17/2017   Procedure: CYSTOSCOPY WITH RETROGRADE PYELOGRAM/URETERAL STENT PLACEMENT;  Surgeon: Festus Aloe, MD;  Location: [facility] OR[facility];  Service: Urology;  Laterality: Left;  . CYSTOSCOPY W/ URETERAL STENT PLACEMENT Left 09/16/2017   Procedure: CYSTOSCOPY WITH STENT REPLACEMENT;  Surgeon: Abbie Sons, MD;  Location: [facility];  Service: Urology;  Laterality: Left;  . CYSTOSCOPY/RETROGRADE/URETEROSCOPY/STONE EXTRACTION WITH BASKET Left 09/16/2017   Procedure: CYSTOSCOPY/RETROGRADE/URETEROSCOPY/STONE EXTRACTION WITH BASKET;  Surgeon: Abbie Sons, MD;  Location: [facility];  Service: Urology;  Laterality: Left;  . EYE SURGERY Bilateral    cataract extraction  . KYPHOPLASTY N/A 02/03/2018   Procedure: [id number];  Surgeon: Hessie Knows, MD;  Location: [facility] OR[facility];  Service: Orthopedics;  Laterality: N/A;  . MASTECTOMY Right 1982  . PACEMAKER INSERTION Left 07/03/2016   Procedure: INSERTION PACEMAKER;  Surgeon: Isaias Cowman, MD;  Location: [facility] OR[facility];  Service: Cardiovascular;  Laterality: Left;  . SKIN CANCER EXCISION    . TUBAL LIGATION      Current Outpatient Rx  . Order #: [id number] Class: Normal  . Order #: [id number] Class: Historical Med  . Order #: [id number] Class: Historical Med  . Order #: [id number] Class: Historical Med  . Order #: [id number] Class: Normal  . Order #: [id number] Class: Historical Med  . Order #: [id number] Class: Historical Med  . Order #: [id number] Class: Historical Med  . Order #: [id number] Class: Historical Med  . Order #: [id number] Class: Normal  . Order #: [id number] Class: Normal  . Order #: [id number] Class: Historical Med  .  Order #: [id number] Class: No Print  . Order #: [id number] Class: Print  . Order #: [id number] Class: Normal  . Order #: [id number] Class: Print  . Order #: [id number] Class: Normal  . Order #: [id number] Class: Historical Med  . Order #: [id number] Class: Historical Med  . Order #: [id number] Class: Historical Med  . Order #: [id number] Class: Historical Med  . Order #: [id number] Class: Historical Med  . Order #: [id number] Class: Historical Med  . Order #: [id number] Class: Normal  . Order #: [id number] Class: Normal    Allergies Patient has no known allergies.  Family History  Problem Relation Age of Onset  . Prostate cancer Neg Hx   . Kidney cancer Neg Hx   . Breast cancer Neg Hx     Social History Social History   Tobacco Use  . Smoking status: Former Smoker    Packs/day: 0.50    Types: Cigarettes    Last attempt to quit: 09/02/1996    Years since quitting: 21.9  . Smokeless tobacco: Never Used  Substance Use Topics  . Alcohol use: Yes    Alcohol/week: 1.0 standard drinks    Types: 1 Glasses of wine per week    Comment: occasionally  . Drug use: No    Review of Systems Constitutional:  No fever/chills.  No lightheadedness or syncope.  Positive mechanical fall without loss of consciousness.  No diaphoresis.  Positive recurrent falls. Eyes: No visual changes.  No blurred or double vision. ENT: No sore throat. No congestion or rhinorrhea. Cardiovascular: Denies chest pain. Denies palpitations. Respiratory: Denies shortness of breath.  No cough. Gastrointestinal: No abdominal pain.  No nausea, no vomiting.  No diarrhea.  No constipation. Genitourinary: Negative for dysuria. Musculoskeletal: Positive for midthoracic back pain.  Denies any neck or lower back pain.  No pain in the hips, knees, ankles or the upper extremities.  Initially, the patient stated she had pain in both knees, but this has resolved. Skin: Negative for rash. Neurological: Negative for headaches. No focal numbness,  tingling or weakness.  Positive slurred speech.  No changes in mental status.    ____________________________________________   PHYSICAL EXAM:  VITAL SIGNS: ED Triage Vitals  Enc Vitals Group     BP      Pulse      Resp      Temp      Temp src      SpO2      Weight      Height      Head Circumference      Peak Flow      Pain Score      Pain Loc      Pain Edu?      Excl. in [place]?     Constitutional: Patient is alert and oriented x3, but does have slurred speech and is sometimes slow to answer questions.  GCS is 15. Eyes: Conjunctivae are normal.  EOMI. nipples are small, 2 mm, symmetric and bilaterally reactive.  No vertical or horizontal nystagmus.  No raccoon eyes.  No scleral icterus. Head: Atraumatic.  No battle sign. Nose: No congestion/rhinnorhea.  No swelling over the nose or septal hematoma. Mouth/Throat: Mucous membranes are very dry.  No malocclusion or dental injury..  Neck: No stridor.  Supple.  No midline C-spine tenderness to palpation, step-offs or deformities.  Full range of motion without pain. Cardiovascular: Normal rate, regular rhythm. No murmurs, rubs or gallops.  Respiratory: Normal respiratory effort.  No accessory muscle use or retractions. Lungs CTAB.  No wheezes, rales or ronchi. Gastrointestinal: Soft, nontender and nondistended.  No guarding or rebound.  No peritoneal signs. Musculoskeletal: Pelvis is stable.  The patient has full range of motion of the bilateral hips, knees and ankles without pain.  She has no effusions in the knees.  There is no bruising or trauma evidence on the legs.  Full range of motion of the bilateral shoulders, elbows and wrist without pain.  The patient has some mid thoracic midline tenderness to palpation without any step-offs or deformities; no overlying skin changes including ecchymosis.  No midline L-spine tenderness to palpation, step-offs or deformities. Neurologic: Alert and oriented 3. Speech is slurred.  Face and  smile symmetric. Tongue is midline.  EOMI.  PERRLA although pupils are pinpoint.  No vertical or horizontal nystagmus.  No pronator drift. 5 out of 5 grip, biceps, triceps,  plantar flexion and dorsiflexion.  4+ out of 5 bilateral hip flexors.  Normal sensation to light touch in the bilateral upper and lower extremities, and face.  Skin:  Skin is warm, dry and intact. No rash noted. Psychiatric: Mood and affect are normal. Speech and behavior are normal.  Normal judgement.  ____________________________________________   LABS (all labs ordered are listed, but only abnormal results are displayed)  Labs Reviewed  CBC - Abnormal; Notable for the following components:      Result Value   RDW 16.4 (*)    All other components within normal limits  COMPREHENSIVE METABOLIC PANEL - Abnormal; Notable for the following components:   Glucose, Bld 360 (*)    All other components within normal limits  URINALYSIS, COMPLETE (UACMP) WITH MICROSCOPIC - Abnormal; Notable for the following components:   Color, Urine STRAW (*)    APPearance CLEAR (*)    Glucose, UA >=500 (*)    Protein, ur 100 (*)    All other components within normal limits  TROPONIN I - Abnormal; Notable for the following components:   Troponin I 0.77 (*)    All other components within normal limits  CK  URINE DRUG SCREEN, QUALITATIVE ([facility] ONLY)  AMMONIA  ETHANOL   ____________________________________________  EKG  ED ECG REPORT I, Anne-Caroline Mariea Clonts, the attending physician, personally viewed and interpreted this ECG.   Date: 08/18/2018  EKG Time: 1120  Rate: 69 Paced  ____________________________________________  RADIOLOGY  Dg Chest 2 View  Result Date: 08/18/2018 CLINICAL DATA:  Status post fall.  Fell from standing. EXAM: CHEST - 2 VIEW COMPARISON:  02/03/2018 FINDINGS: There is mild bilateral interstitial thickening. There is a trace pleural effusion bilaterally. There is no focal consolidation or pneumothorax.  There is mild stable cardiomegaly. There is a dual lead cardiac pacemaker. The heart and mediastinal contours are unremarkable. The osseous structures are unremarkable. IMPRESSION: Cardiomegaly with mild pulmonary vascular congestion. Likely underlying mild chronic interstitial lung disease. Electronically Signed   By: Kathreen Devoid   On: 08/18/2018 12:19   Dg Thoracic Spine 2 View  Result Date: 08/18/2018 CLINICAL DATA:  Fall with back pain. EXAM: THORACIC SPINE 2 VIEWS COMPARISON:  Abdomen and pelvis CT 01/07/2018 FINDINGS: T11 compression fracture and cement augmentation. Remote L1 inferior endplate fracture. No evidence of acute fracture or traumatic malalignment. Posterior mediastinal fat planes are preserved. Chronic interstitial coarsening. Chronic cardiomegaly with dual-chamber pacer. IMPRESSION: 1. No acute finding. 2. Remote T11 and L1 compression fractures. Electronically Signed   By: Monte Fantasia M.D.   On: 08/18/2018 12:17   Dg Pelvis 1-2 Views  Result Date: 08/18/2018 CLINICAL DATA:  Fall at home.  Confusion. EXAM: PELVIS - 1-2 VIEW COMPARISON:  Abdomen and pelvis CT 01/07/2018 FINDINGS: There is no evidence of pelvic fracture or diastasis. No pelvic bone lesions are seen. Generalized osteopenia. IMPRESSION: Negative. Electronically Signed   By: Monte Fantasia M.D.   On: 08/18/2018 12:18   Ct Head Wo Contrast  Result Date: 08/18/2018 CLINICAL DATA:  Confusion today. Status post fall today. Initial encounter. EXAM: CT HEAD WITHOUT CONTRAST TECHNIQUE: Contiguous axial images were obtained from the base of the skull through the vertex without intravenous contrast. COMPARISON:  Head CT scan 07/15/2018 and 08/04/2017. FINDINGS: Brain: No evidence of acute infarction, hemorrhage, hydrocephalus, extra-axial collection or mass lesion/mass effect. There is some atrophy and chronic microvascular ischemic change. Vascular: Atherosclerosis noted. Skull: Intact.  No focal lesion. Sinuses/Orbits:  Negative. Other: None. IMPRESSION: No acute abnormality. Electronically Signed   By: Inge Rise M.D.   On: 08/18/2018 11:50    ____________________________________________   PROCEDURES  Procedure(s) performed: None  Procedures  Critical Care performed: Yes ____________________________________________   INITIAL IMPRESSION / ASSESSMENT AND PLAN / ED COURSE  Pertinent labs & imaging results that were available during my care of the patient were reviewed by me and considered in my medical decision making (see  chart for details).  84 y.o. female with a mechanical fall off stool yesterday, after her first time taking Valium.  Overall, the patient has some signs and symptoms which would be consistent with the Valium, especially since this is a new medicine.  Her slurred speech, and fall could be attributable to this medication.  However, other etiologies must be excluded including CVA, UTI, electrolyte disturbance, or cardiac abnormality.  Imaging and laboratory studies have been ordered.  The patient will receive intravenous fluids and be monitored closely for final disposition.  ----------------------------------------- 12:24 PM on 08/18/2018 -----------------------------------------  The patient CT scan does not show any acute intracranial process.  However, given her slurred speech, we will proceed with MRI of the brain to complete her stroke evaluation.  The patient has a paced rhythm on her EKG, and I am awaiting the results of her interrogation.  She does have an elevated troponin today at 0.77; I have treated her with aspirin and will admit her to the hospital for continued cardiac evaluation.  Her CK is normal at 136.  Her electrolytes are normal with the exception of hyperglycemia without DKA.  I will treat her hyperglycemia with insulin.  The patient will be admitted in stable condition.  CRITICAL CARE Performed by: Eula Listen   Total critical care time: 35  minutes  Critical care time was exclusive of separately billable procedures and treating other patients.  Critical care was necessary to treat or prevent imminent or life-threatening deterioration.  Critical care was time spent personally by me on the following activities: development of treatment plan with patient and/or surrogate as well as nursing, discussions with consultants, evaluation of patient's response to treatment, examination of patient, obtaining history from patient or surrogate, ordering and performing treatments and interventions, ordering and review of laboratory studies, ordering and review of radiographic studies, pulse oximetry and re-evaluation of patient's condition.   ____________________________________________  FINAL CLINICAL IMPRESSION(S) / ED DIAGNOSES  Final diagnoses:  Hyperglycemia  NSTEMI (non-ST elevated myocardial infarction) ([place])  Slurred speech         NEW MEDICATIONS STARTED DURING THIS VISIT:  New Prescriptions   No medications on file      Eula Listen, MD 08/18/18 1224    Eula Listen, MD 09/08/18 1400

## 2018-08-18 NOTE — ED Triage Notes (Signed)
Pt arrives to ED from home via [facility] with c/c of knee and low back pain s/p fall at 0300 this morining. Pt states she was sitting at a table, felt sleepy, and fell out of her chair landing on her back. She was unable to stand and her husband was not able to get her up. Husband called EMS. Pt has Hx of DBM, HTN, breast cancer, pace maker. Husband states that she was started on valium several days ago and has been lethargic since then.

## 2018-08-18 NOTE — ED Notes (Signed)
ED TO INPATIENT HANDOFF REPORT  Name/Age/Gender Cassandra [name] 84 y.o. female  Code Status Code Status History    Date Active Date Inactive Code Status Order ID Comments User Context   02/03/2018 1644 02/03/2018 2036 Full Code [id number]  Hessie Knows, MD Inpatient   11/07/2017 1658 11/08/2017 1800 Full Code [id number]  Nicholes Mango, MD Inpatient   09/16/2017 1423 09/23/2017 1525 Full Code [id number]  Fritzi Mandes, MD Inpatient   08/17/2017 1958 08/20/2017 1447 Full Code [id number]  Idelle Crouch, MD Inpatient   07/03/2016 1637 07/04/2016 1255 Full Code [id number]  Isaias Cowman, MD Inpatient   06/14/2016 2344 06/16/2016 1358 Full Code [id number]  Harvie Bridge, DO Inpatient   06/07/2016 0128 06/08/2016 1512 Full Code [id number]  Lance Coon, MD Inpatient   04/23/2016 1404 04/24/2016 1435 Full Code [id number]  Yolonda Kida, MD Inpatient   04/21/2016 2018 04/22/2016 0859 Full Code [id number]  Idelle Crouch, MD Inpatient    Advance Directive Documentation     Most Recent Value  Type of Advance Directive  Healthcare Power of Attorney, Living will  Pre-existing out of facility DNR order (yellow form or pink MOST form)  -  "MOST" Form in Place?  -      Home/SNF/Other Home  Chief Complaint fall  Level of Care/Admitting Diagnosis ED Disposition    ED Disposition Condition [place]: [place] [[id number]]  Level of Care: Telemetry [5]  Diagnosis: Non-STEMI (non-ST elevated myocardial infarction) [facility]) [[id number]]  Admitting Physician: Saundra Shelling [[id number]]  Attending Physician: Saundra Shelling [[id number]]  Estimated length of stay: past midnight tomorrow  Certification:: I certify this patient will need inpatient services for at least 2 midnights  PT Class (Do Not Modify): Inpatient [101]  PT Acc Code (Do Not Modify): Private [1]       Medical History Past Medical History:  Diagnosis Date  . Acquired hypothyroidism 01/06/2014  .  Anxiety   . Arthritis   . B12 deficiency 02/15/2014  . Benign essential hypertension 01/06/2014  . Breast cancer [facility]) 1982   Right breast cancer - chemotherapy  . CAD (coronary artery disease), native coronary artery 01/06/2014  . Carotid artery calcification   . Chronic airway obstruction, not elsewhere classified 01/06/2014  . Depression   . Diabetes mellitus without complication ([place])   . History of kidney stones 2019   left ureteral stone  . Incontinence of urine   . Myocardial infarction ([place]) 1982   small infarct  . Pacemaker   . Presence of permanent cardiac pacemaker 07/2016  . Pure hypercholesterolemia 01/06/2014  . Renal insufficiency   . Skin cancer   . Sleep apnea    uses cpap    Allergies No Known Allergies  IV Location/Drains/Wounds Patient Lines/Drains/Airways Status   Active Line/Drains/Airways    Name:   Placement date:   Placement time:   Site:   Days:   Peripheral IV 08/18/18 Left Antecubital   08/18/18    1131    Antecubital   less than 1   Peripheral IV 08/18/18 Right Antecubital   08/18/18    1455    Antecubital   less than 1   Incision (Closed) 02/03/18 Back Other (Comment)   02/03/18    1439     196          Labs/Imaging Results for orders placed or performed during the hospital encounter of 08/18/18 (from the past 48 hour(s))  CBC     Status:  Abnormal   Collection Time: 08/18/18 11:27 AM  Result Value Ref Range   WBC 9.9 4.0 - 10.5 K/uL   RBC 5.04 3.87 - 5.11 MIL/uL   Hemoglobin 13.3 12.0 - 15.0 g/dL   HCT 42.2 36.0 - 46.0 %   MCV 83.7 80.0 - 100.0 fL   MCH 26.4 26.0 - 34.0 pg   MCHC 31.5 30.0 - 36.0 g/dL   RDW 16.4 (H) 11.5 - 15.5 %   Platelets 273 150 - 400 K/uL   nRBC 0.0 0.0 - 0.2 %    Comment: Performed at [facility], [address] [place], [place] [address]  Comprehensive metabolic panel     Status: Abnormal   Collection Time: 08/18/18 11:27 AM  Result Value Ref Range   Sodium 136 135 - 145 mmol/L   Potassium 3.8 3.5 -  5.1 mmol/L   Chloride 100 98 - 111 mmol/L   CO2 26 22 - 32 mmol/L   Glucose, Bld 360 (H) 70 - 99 mg/dL   BUN 17 8 - 23 mg/dL   Creatinine, Ser 0.83 0.44 - 1.00 mg/dL   Calcium 8.9 8.9 - 10.3 mg/dL   Total Protein 7.8 6.5 - 8.1 g/dL   Albumin 3.9 3.5 - 5.0 g/dL   AST 17 15 - 41 U/L   ALT 13 0 - 44 U/L   Alkaline Phosphatase 99 38 - 126 U/L   Total Bilirubin 0.6 0.3 - 1.2 mg/dL   GFR calc non Af Amer >60 >60 mL/min   GFR calc Af Amer >60 >60 mL/min   Anion gap 10 5 - 15    Comment: Performed at [facility], [address], [place], [place] [address]  Urine Drug Screen, Qualitative ([facility] only)     Status: Abnormal   Collection Time: 08/18/18 11:27 AM  Result Value Ref Range   Tricyclic, Ur Screen NONE DETECTED NONE DETECTED   Amphetamines, Ur Screen NONE DETECTED NONE DETECTED   MDMA (Ecstasy)Ur Screen NONE DETECTED NONE DETECTED   Cocaine Metabolite,Ur [place] NONE DETECTED NONE DETECTED   Opiate, Ur Screen NONE DETECTED NONE DETECTED   Phencyclidine (PCP) Ur S NONE DETECTED NONE DETECTED   Cannabinoid 50 Ng, Ur [place] NONE DETECTED NONE DETECTED   Barbiturates, Ur Screen NONE DETECTED NONE DETECTED   Benzodiazepine, Ur Scrn POSITIVE (A) NONE DETECTED   Methadone Scn, Ur NONE DETECTED NONE DETECTED    Comment: (NOTE) Tricyclics + metabolites, urine    Cutoff 1000 ng/mL Amphetamines + metabolites, urine  Cutoff 1000 ng/mL MDMA (Ecstasy), urine              Cutoff 500 ng/mL Cocaine Metabolite, urine          Cutoff 300 ng/mL Opiate + metabolites, urine        Cutoff 300 ng/mL Phencyclidine (PCP), urine         Cutoff 25 ng/mL Cannabinoid, urine                 Cutoff 50 ng/mL Barbiturates + metabolites, urine  Cutoff 200 ng/mL Benzodiazepine, urine              Cutoff 200 ng/mL Methadone, urine                   Cutoff 300 ng/mL The urine drug screen provides only a preliminary, unconfirmed analytical test result and should not be used for non-medical purposes. Clinical  consideration and professional judgment should be applied to any positive drug  screen result due to possible interfering substances. A more specific alternate chemical method must be used in order to obtain a confirmed analytical result. Gas chromatography / mass spectrometry (GC/MS) is the preferred confirmat ory method. Performed at [facility], [address] [place], [place] [address]   Urinalysis, Complete w Microscopic     Status: Abnormal   Collection Time: 08/18/18 11:27 AM  Result Value Ref Range   Color, Urine STRAW (A) YELLOW   APPearance CLEAR (A) CLEAR   Specific Gravity, Urine 1.020 1.005 - 1.030   pH 7.0 5.0 - 8.0   Glucose, UA >=500 (A) NEGATIVE mg/dL   Hgb urine dipstick NEGATIVE NEGATIVE   Bilirubin Urine NEGATIVE NEGATIVE   Ketones, ur NEGATIVE NEGATIVE mg/dL   Protein, ur 100 (A) NEGATIVE mg/dL   Nitrite NEGATIVE NEGATIVE   Leukocytes, UA NEGATIVE NEGATIVE   RBC / HPF 0-5 0 - 5 RBC/hpf   WBC, UA 0-5 0 - 5 WBC/hpf   Bacteria, UA NONE SEEN NONE SEEN   Squamous Epithelial / LPF 0-5 0 - 5   Mucus PRESENT     Comment: Performed at [facility], [address] [place], [place] [address]  CK     Status: None   Collection Time: 08/18/18 11:27 AM  Result Value Ref Range   Total CK 136 38 - 234 U/L    Comment: Performed at [facility], [address] [place], [place] [address]  Troponin I - ONCE - STAT     Status: Abnormal   Collection Time: 08/18/18 11:27 AM  Result Value Ref Range   Troponin I 0.77 (HH) <0.03 ng/mL    Comment: CRITICAL RESULT CALLED TO, READ BACK BY AND VERIFIED WITH  TOM Caly Pellum AT 1216 08/18/18 SDR Performed at [facility], [address] [place], [place] [address]   APTT     Status: None   Collection Time: 08/18/18 11:27 AM  Result Value Ref Range   aPTT 28 24 - 36 seconds    Comment: Performed at [facility], [address] [place], [place] [address]  Protime-INR     Status: None    Collection Time: 08/18/18 11:27 AM  Result Value Ref Range   Prothrombin Time 13.0 11.4 - 15.2 seconds   INR 0.99     Comment: Performed at [facility], [address] [place], [place] [address]  Ammonia     Status: None   Collection Time: 08/18/18 12:13 PM  Result Value Ref Range   Ammonia 14 9 - 35 umol/L    Comment: HEMOLYSIS AT THIS LEVEL MAY AFFECT RESULT Performed at [facility], [address] [place], [place] [address]   Glucose, capillary     Status: Abnormal   Collection Time: 08/18/18  2:41 PM  Result Value Ref Range   Glucose-Capillary 237 (H) 70 - 99 mg/dL   Dg Chest 2 View  Result Date: 08/18/2018 CLINICAL DATA:  Status post fall.  Fell from standing. EXAM: CHEST - 2 VIEW COMPARISON:  02/03/2018 FINDINGS: There is mild bilateral interstitial thickening. There is a trace pleural effusion bilaterally. There is no focal consolidation or pneumothorax. There is mild stable cardiomegaly. There is a dual lead cardiac pacemaker. The heart and mediastinal contours are unremarkable. The osseous structures are unremarkable. IMPRESSION: Cardiomegaly with mild pulmonary vascular congestion. Likely underlying mild chronic interstitial lung disease. Electronically Signed   By: Kathreen Devoid   On: 08/18/2018 12:19   Dg Thoracic  Spine 2 View  Result Date: 08/18/2018 CLINICAL DATA:  Fall with back pain. EXAM: THORACIC SPINE 2 VIEWS COMPARISON:  Abdomen and pelvis CT 01/07/2018 FINDINGS: T11 compression fracture and cement augmentation. Remote L1 inferior endplate fracture. No evidence of acute fracture or traumatic malalignment. Posterior mediastinal fat planes are preserved. Chronic interstitial coarsening. Chronic cardiomegaly with dual-chamber pacer. IMPRESSION: 1. No acute finding. 2. Remote T11 and L1 compression fractures. Electronically Signed   By: Monte Fantasia M.D.   On: 08/18/2018 12:17   Dg Pelvis 1-2 Views  Result Date: 08/18/2018 CLINICAL DATA:   Fall at home.  Confusion. EXAM: PELVIS - 1-2 VIEW COMPARISON:  Abdomen and pelvis CT 01/07/2018 FINDINGS: There is no evidence of pelvic fracture or diastasis. No pelvic bone lesions are seen. Generalized osteopenia. IMPRESSION: Negative. Electronically Signed   By: Monte Fantasia M.D.   On: 08/18/2018 12:18   Ct Head Wo Contrast  Result Date: 08/18/2018 CLINICAL DATA:  Confusion today. Status post fall today. Initial encounter. EXAM: CT HEAD WITHOUT CONTRAST TECHNIQUE: Contiguous axial images were obtained from the base of the skull through the vertex without intravenous contrast. COMPARISON:  Head CT scan 07/15/2018 and 08/04/2017. FINDINGS: Brain: No evidence of acute infarction, hemorrhage, hydrocephalus, extra-axial collection or mass lesion/mass effect. There is some atrophy and chronic microvascular ischemic change. Vascular: Atherosclerosis noted. Skull: Intact.  No focal lesion. Sinuses/Orbits: Negative. Other: None. IMPRESSION: No acute abnormality. Electronically Signed   By: Inge Rise M.D.   On: 08/18/2018 11:50    Pending Labs Unresulted Labs (From admission, onward)    Start     Ordered   08/19/18 0500  CBC  Tomorrow morning,   STAT     08/18/18 1410   08/18/18 2230  Heparin level (unfractionated)  Once-Timed,   STAT     08/18/18 1403   Signed and Held  Troponin I - Now Then Q6H  Now then every 6 hours,   STAT     Signed and Held   Signed and Held  Basic metabolic panel  Tomorrow morning,   R     Signed and Held   Signed and Held  Lipid panel  Tomorrow morning,   R     Signed and Held   Signed and Held  CBC  Tomorrow morning,   R     Signed and Held          Vitals/Pain Today's Vitals   08/18/18 1331 08/18/18 1332 08/18/18 1400 08/18/18 1430  BP: (!) 168/86  (!) 187/78 (!) 144/56  Pulse: 69 66 (!) 59 60  Resp:   19 18  Temp:      TempSrc:      SpO2:  98% 97% 94%  Weight:      Height:      PainSc:    0-No pain    Isolation Precautions No active  isolations  Medications Medications  heparin bolus via infusion 3,600 Units (has no administration in time range)  heparin ADULT infusion 100 units/mL (25000 units/241mL sodium chloride 0.45%) (has no administration in time range)  sodium chloride 0.9 % bolus 1,000 mL (0 mLs Intravenous Stopped 08/18/18 1343)  metoprolol tartrate (LOPRESSOR) injection 10 mg (10 mg Intravenous Given 08/18/18 1226)  aspirin chewable tablet 324 mg (324 mg Oral Given 08/18/18 1336)  insulin aspart (novoLOG) injection 10 Units (10 Units Intravenous Given 08/18/18 1300)  traMADol (ULTRAM) tablet 50 mg (50 mg Oral Not Given 08/18/18 1353)    Mobility walks with device

## 2018-08-19 ENCOUNTER — Inpatient Hospital Stay
Admit: 2018-08-19 | Discharge: 2018-08-19 | Disposition: A | Discharge status: Home / Self Care | Payer: Medicare Other | Attending: Internal Medicine | Admitting: Internal Medicine

## 2018-08-19 LAB — BASIC METABOLIC PANEL
Anion gap: 6 (ref 5–15)
BUN: 18 mg/dL (ref 8–23)
CO2: 22 mmol/L (ref 22–32)
Calcium: 8 mg/dL — ABNORMAL LOW (ref 8.9–10.3)
Chloride: 106 mmol/L (ref 98–111)
Creatinine, Ser: 0.94 mg/dL (ref 0.44–1.00)
GFR calc Af Amer: >60 mL/min (ref >60)
GFR calc non Af Amer: 56 mL/min — ABNORMAL LOW (ref >60)
Glucose, Bld: 191 mg/dL — ABNORMAL HIGH (ref 70–99)
Potassium: 3.4 mmol/L — ABNORMAL LOW (ref 3.5–5.1)
Sodium: 134 mmol/L — ABNORMAL LOW (ref 135–145)

## 2018-08-19 LAB — GLUCOSE, CAPILLARY
GLUCOSE-CAPILLARY: 260 mg/dL — AB (ref 70–99)
Glucose-Capillary: 199 mg/dL — ABNORMAL HIGH (ref 70–99)
Glucose-Capillary: 110 mg/dL — ABNORMAL HIGH (ref 70–99)
Glucose-Capillary: 290 mg/dL — ABNORMAL HIGH (ref 70–99)

## 2018-08-19 LAB — CBC
HCT: 36 % (ref 36.0–46.0)
Hemoglobin: 11.2 g/dL — ABNORMAL LOW (ref 12.0–15.0)
MCH: 26.5 pg (ref 26.0–34.0)
MCHC: 31.1 g/dL (ref 30.0–36.0)
MCV: 85.1 fL (ref 80.0–100.0)
Platelets: 241 10*3/uL (ref 150–400)
RBC: 4.23 MIL/uL (ref 3.87–5.11)
RDW: 16.6 % — ABNORMAL HIGH (ref 11.5–15.5)
WBC: 8.8 10*3/uL (ref 4.0–10.5)
nRBC: 0 % (ref 0.0–0.2)

## 2018-08-19 LAB — ECHOCARDIOGRAM COMPLETE
Height: 60 in
Weight: 2557.34 oz

## 2018-08-19 LAB — BLOOD GAS, ARTERIAL
Acid-base deficit: 0.4 mmol/L (ref 0.0–2.0)
Bicarbonate: 24.8 mmol/L (ref 20.0–28.0)
FIO2: 0.21
O2 Saturation: 89.6 %
Patient temperature: 37
pCO2 arterial: 42 mmHg (ref 32.0–48.0)
pH, Arterial: 7.38 (ref 7.350–7.450)
pO2, Arterial: 59 mmHg — ABNORMAL LOW (ref 83.0–108.0)

## 2018-08-19 LAB — LIPID PANEL
Cholesterol: 210 mg/dL — ABNORMAL HIGH (ref 0–200)
HDL: 42 mg/dL (ref >40)
LDL CALC: 127 mg/dL — AB (ref 0–99)
Total CHOL/HDL Ratio: 5 RATIO
Triglycerides: 204 mg/dL — ABNORMAL HIGH (ref <150)
VLDL: 41 mg/dL — ABNORMAL HIGH (ref 0–40)

## 2018-08-19 LAB — TROPONIN I: Troponin I: 0.59 ng/mL (ref <0.03)

## 2018-08-19 LAB — MAGNESIUM: Magnesium: 1.8 mg/dL (ref 1.7–2.4)

## 2018-08-19 MED ORDER — INSULIN GLARGINE 100 UNIT/ML ~~LOC~~ SOLN
25.0000 [IU] | Freq: Every day | SUBCUTANEOUS | Status: DC
  Administered 2018-08-19: 25 [IU] via SUBCUTANEOUS
  Filled 2018-08-19 (×2): qty 0.25

## 2018-08-19 MED ORDER — DIAZEPAM 2 MG PO TABS
2.0000 mg | ORAL_TABLET | Freq: Two times a day (BID) | ORAL | Status: DC
  Filled 2018-08-19: qty 1

## 2018-08-19 MED ORDER — POTASSIUM CHLORIDE CRYS ER 20 MEQ PO TBCR
40.0000 meq | EXTENDED_RELEASE_TABLET | Freq: Once | ORAL | Status: AC
  Administered 2018-08-19: 40 meq via ORAL
  Filled 2018-08-19: qty 2

## 2018-08-19 MED ORDER — DIAZEPAM 2 MG PO TABS: 2.0000 mg | ORAL_TABLET | Freq: Every day | ORAL | 0 refills | Status: DC

## 2018-08-19 MED ORDER — INSULIN ASPART 100 UNIT/ML ~~LOC~~ SOLN
6.0000 [IU] | Freq: Three times a day (TID) | SUBCUTANEOUS | Status: DC
  Administered 2018-08-19 – 2018-08-21 (×6): 6 [IU] via SUBCUTANEOUS
  Filled 2018-08-19 (×5): qty 1

## 2018-08-19 MED ORDER — ROSUVASTATIN CALCIUM 10 MG PO TABS: 20.0000 mg | ORAL_TABLET | Freq: Every day | ORAL | 1 refills | Status: DC

## 2018-08-19 NOTE — Progress Notes (Signed)
[facility]  SUBJECTIVE: The patient reports feeling fine this morning. She denies any chest pain or shortness of breath. She reports a cramping sensation in her leg intermittently, which is not new for her.    Vitals:   08/18/18 2304 08/19/18 0458 08/19/18 0557 08/19/18 0806  BP:  135/61  (!) 124/53  Pulse: 60 60  (!) 59  Resp: 16 (!) 22    Temp:  (!) 96.9 F (36.1 C)    TempSrc:      SpO2: 93% 93%  91%  Weight:   72.5 kg   Height:         Intake/Output Summary (Last 24 hours) at 08/19/2018 0926 Last data filed at 08/19/2018 2355 Gross per 24 hour  Intake 2003.42 ml  Output -  Net 2003.42 ml      PHYSICAL EXAM  General: Well developed, well nourished, in no acute distress, sitting up in bed taking medications HEENT:  Normocephalic and atramatic Neck:  No JVD.  Lungs: normal effort of breathing without accessory respiratory muscle use. Heart: HRRR . Normal S1 and S2 without gallops or murmurs.  Abdomen: nondistended  Msk:  Back normal, gait not assessed. Extremities:  1+ bilateral lower extremity edema. Neuro: Alert and oriented X 3. Psych:  Good affect, responds appropriately. Drowsy.    LABS: Basic Metabolic Panel: Recent Labs    08/18/18 1127 08/19/18 0349  NA 136 134*  K 3.8 3.4*  CL 100 106  CO2 26 22  GLUCOSE 360* 191*  BUN 17 18  CREATININE 0.83 0.94  CALCIUM 8.9 8.0*  MG  --  1.8   Liver Function Tests: Recent Labs    08/18/18 1127  AST 17  ALT 13  ALKPHOS 99  BILITOT 0.6  PROT 7.8  ALBUMIN 3.9   No results for input(s): LIPASE, AMYLASE in the last 72 hours. CBC: Recent Labs    08/18/18 1127 08/19/18 0349  WBC 9.9 8.8  HGB 13.3 11.2*  HCT 42.2 36.0  MCV 83.7 85.1  PLT 273 241   Cardiac Enzymes: Recent Labs    08/18/18 1127 08/18/18 1549 08/18/18 2204 08/19/18 0349  CKTOTAL 136  --   --   --   TROPONINI 0.77* 0.57* 0.63* 0.59*   BNP: Invalid input(s): POCBNP D-Dimer: No results for input(s): DDIMER in the last 72  hours. Hemoglobin A1C: No results for input(s): HGBA1C in the last 72 hours. Fasting Lipid Panel: Recent Labs    08/19/18 0349  CHOL 210*  HDL 42  LDLCALC 127*  TRIG 204*  CHOLHDL 5.0   Thyroid Function Tests: No results for input(s): TSH, T4TOTAL, T3FREE, THYROIDAB in the last 72 hours.  Invalid input(s): FREET3 Anemia Panel: No results for input(s): VITAMINB12, FOLATE, FERRITIN, TIBC, IRON, RETICCTPCT in the last 72 hours.  Dg Chest 2 View  Result Date: 08/18/2018 CLINICAL DATA:  Status post fall.  Fell from standing. EXAM: CHEST - 2 VIEW COMPARISON:  02/03/2018 FINDINGS: There is mild bilateral interstitial thickening. There is a trace pleural effusion bilaterally. There is no focal consolidation or pneumothorax. There is mild stable cardiomegaly. There is a dual lead cardiac pacemaker. The heart and mediastinal contours are unremarkable. The osseous structures are unremarkable. IMPRESSION: Cardiomegaly with mild pulmonary vascular congestion. Likely underlying mild chronic interstitial lung disease. Electronically Signed   By: Kathreen Devoid   On: 08/18/2018 12:19   Dg Thoracic Spine 2 View  Result Date: 08/18/2018 CLINICAL DATA:  Fall with back pain. EXAM: THORACIC SPINE 2  VIEWS COMPARISON:  Abdomen and pelvis CT 01/07/2018 FINDINGS: T11 compression fracture and cement augmentation. Remote L1 inferior endplate fracture. No evidence of acute fracture or traumatic malalignment. Posterior mediastinal fat planes are preserved. Chronic interstitial coarsening. Chronic cardiomegaly with dual-chamber pacer. IMPRESSION: 1. No acute finding. 2. Remote T11 and L1 compression fractures. Electronically Signed   By: Monte Fantasia M.D.   On: 08/18/2018 12:17   Dg Pelvis 1-2 Views  Result Date: 08/18/2018 CLINICAL DATA:  Fall at home.  Confusion. EXAM: PELVIS - 1-2 VIEW COMPARISON:  Abdomen and pelvis CT 01/07/2018 FINDINGS: There is no evidence of pelvic fracture or diastasis. No pelvic bone  lesions are seen. Generalized osteopenia. IMPRESSION: Negative. Electronically Signed   By: Monte Fantasia M.D.   On: 08/18/2018 12:18   Ct Head Wo Contrast  Result Date: 08/18/2018 CLINICAL DATA:  Confusion today. Status post fall today. Initial encounter. EXAM: CT HEAD WITHOUT CONTRAST TECHNIQUE: Contiguous axial images were obtained from the base of the skull through the vertex without intravenous contrast. COMPARISON:  Head CT scan 07/15/2018 and 08/04/2017. FINDINGS: Brain: No evidence of acute infarction, hemorrhage, hydrocephalus, extra-axial collection or mass lesion/mass effect. There is some atrophy and chronic microvascular ischemic change. Vascular: Atherosclerosis noted. Skull: Intact.  No focal lesion. Sinuses/Orbits: Negative. Other: None. IMPRESSION: No acute abnormality. Electronically Signed   By: Inge Rise M.D.   On: 08/18/2018 11:50     Echo Pending  TELEMETRY: Sinus rhythm 67 bpm  ASSESSMENT AND PLAN:  Active Problems:   Non-STEMI (non-ST elevated myocardial infarction) ([place])    1. Elevated troponin in the absence of chest pain. No peak or trough. Likely demand supply ischemia, less likely ACS. Troponin 0.77, followed by 0.57, 0.63, and 0.59. ECG without ST changes. 2. Known severe 2-vessel CAD, status post PCI of LAD in 2017 with residual occluded RCA with collaterals in 2017, on aspirin 3. Hypertension, had been uncontrolled, much better today 4. Frequent falls with recent mechanical fall off of high stool, after taking Valium and drinking alcohol 5. Sleep apnea, with minimal CPAP use with recent history of falling asleep easily throughout the day 6. Sick sinus syndrome, status post pacemaker with interrogation in the ER  Recommendations: 1. Review 2D echocardiogram 3. CPAP at night 4. Defer further cardiac diagnostics at this time 5. Continue aspirin, lisinopril, metoprolol, Crestor   Clabe Seal, PA-C 08/19/2018 9:26 AM

## 2018-08-19 NOTE — Progress Notes (Signed)
Inpatient Diabetes Program Recommendations  AACE/ADA: New Consensus Statement on Inpatient Glycemic Control (2015)  Target Ranges:  Prepandial:   less than 140 mg/dL      Peak postprandial:   less than 180 mg/dL (1-2 hours)      Critically ill patients:  140 - 180 mg/dL   Lab Results  Component Value Date   GLUCAP 199 (H) 08/19/2018   HGBA1C 7.4 (H) 06/07/2016    Review of Glycemic Control Results for Cassandra [name], Cassandra [name] (MRN [id number]) as of 08/19/2018 09:43  Ref. Range 08/18/2018 14:41 08/18/2018 16:53 08/18/2018 20:51 08/19/2018 08:07  Glucose-Capillary Latest Ref Range: 70 - 99 mg/dL 237 (H) 304 (H) 276 (H) 199 (H)   Diabetes history: DM 2 Outpatient Diabetes medications:  Lantus 50 units q HS, Novolog 12 units tid with meals, Metformin 1000 mg bid Current orders for Inpatient glycemic control:  Novolog moderate tid with meals and HS Inpatient Diabetes Program Recommendations:   Please consider adding Lantus 25 units daily and Novolog 6 units tid with meals for meal coverage (this is 1/2 of home dose).   Thanks,  Adah Perl, RN, BC-ADM Inpatient Diabetes Coordinator Pager [phone] (8a-5p)

## 2018-08-19 NOTE — Progress Notes (Signed)
*  PRELIMINARY RESULTS* Echocardiogram 2D Echocardiogram has been performed.  Cassandra [name] [name] 08/19/2018, 11:52 AM

## 2018-08-19 NOTE — Progress Notes (Signed)
[place] at [place] NAME: Cassandra [name]    MR#:  [id number]  DATE OF BIRTH:  1933-01-02  SUBJECTIVE:  CHIEF COMPLAINT:   Chief Complaint  Patient presents with  . Fall  . Knee Pain   The patient is drowsy and weak. REVIEW OF SYSTEMS:  Review of Systems  Constitutional: Positive for malaise/fatigue. Negative for chills and fever.  HENT: Negative for sore throat.   Eyes: Negative for blurred vision and double vision.  Respiratory: Negative for cough, hemoptysis, shortness of breath, wheezing and stridor.   Cardiovascular: Negative for chest pain, palpitations, orthopnea and leg swelling.  Gastrointestinal: Negative for abdominal pain, blood in stool, diarrhea, melena, nausea and vomiting.  Genitourinary: Negative for dysuria, flank pain and hematuria.  Musculoskeletal: Negative for back pain and joint pain.  Neurological: Negative for dizziness, sensory change, focal weakness, seizures, loss of consciousness, weakness and headaches.  Endo/Heme/Allergies: Negative for polydipsia.  Psychiatric/Behavioral: Negative for depression. The patient is not nervous/anxious.     DRUG ALLERGIES:  No Known Allergies VITALS:  Blood pressure (!) 117/50, pulse 60, temperature (!) 96.9 F (36.1 C), resp. rate (!) 22, height 5' (1.524 m), weight 72.5 kg, SpO2 98 %. PHYSICAL EXAMINATION:  Physical Exam HENT:     Head: Normocephalic.     Mouth/Throat:     Mouth: Mucous membranes are moist.  Eyes:     General: No scleral icterus.    Conjunctiva/sclera: Conjunctivae normal.     Pupils: Pupils are equal, round, and reactive to light.  Neck:     Musculoskeletal: Normal range of motion and neck supple.     Vascular: No JVD.     Trachea: No tracheal deviation.  Cardiovascular:     Rate and Rhythm: Normal rate and regular rhythm.     Heart sounds: Normal heart sounds. No murmur. No gallop.   Pulmonary:     Effort: Pulmonary effort is normal. No  respiratory distress.     Breath sounds: Normal breath sounds. No wheezing or rales.  Abdominal:     General: Bowel sounds are normal. There is no distension.     Palpations: Abdomen is soft.     Tenderness: There is no abdominal tenderness. There is no rebound.  Musculoskeletal: Normal range of motion.        General: No tenderness.  Skin:    Findings: No erythema or rash.  Neurological:     Mental Status: She is alert and oriented to person, place, and time.     Cranial Nerves: No cranial nerve deficit.     Comments: Drowsy.    LABORATORY PANEL:  Female CBC Recent Labs  Lab 08/19/18 0349  WBC 8.8  HGB 11.2*  HCT 36.0  PLT 241   ------------------------------------------------------------------------------------------------------------------ Chemistries  Recent Labs  Lab 08/18/18 1127 08/19/18 0349  NA 136 134*  K 3.8 3.4*  CL 100 106  CO2 26 22  GLUCOSE 360* 191*  BUN 17 18  CREATININE 0.83 0.94  CALCIUM 8.9 8.0*  MG  --  1.8  AST 17  --   ALT 13  --   ALKPHOS 99  --   BILITOT 0.6  --    RADIOLOGY:  No results found. ASSESSMENT AND PLAN:   84 year old elderly female patient with history of hypothyroidism, myocardial infarction in the past, coronary disease, diabetes mellitus type 2, hypertension, breast cancer, hyperlipidemia presented to the emergency room for a fall from the high stool.  She  had Margarita and Valium last evening.  -Syncope and collapse, possible due to benzodiazepine sedation. Need to taper Valium and Ativan. Pacemaker interrogated in the emergency room  -Elevated troponin, demand ischemia Continue aspirin, beta-blocker, heparin drip was discontinued by cardiologist.  Echo is unremarkable. EF: 55% -   65%  -History of breast cancer Supportive care  -Dehydration Improved with IV fluids  hypertension Control blood pressure with beta-blocker with metoprolol and PRN hydralazine Added Ace inhibitor  Hyperglycemia due to  diabetes mellitus Diabetic diet with sliding scale coverage with insulin, start Lantus and NovoLog AC. Hold metformin.  Hyperlipidemia.  Increase Crestor to 20 mg at bedtime. Sleep apnea.  CPAP at night. Generalized weakness and frequent falls,  the patient did need home health and PT. All the records are reviewed and case discussed with Care Management/Social Worker. Management plans discussed with the patient, her son and husband and they are in agreement.  CODE STATUS: Full Code  TOTAL TIME TAKING CARE OF THIS PATIENT: 33 minutes.   More than 50% of the time was spent in counseling/coordination of care: YES  POSSIBLE D/C IN 1 DAYS, DEPENDING ON CLINICAL CONDITION.   Demetrios Loll M.D on 08/19/2018 at 5:29 PM  Between 7am to 6pm - Pager - [phone]  After 6pm go to [email or web address] - [facility]

## 2018-08-19 NOTE — Discharge Instructions (Signed)
Diet control

## 2018-08-20 LAB — GLUCOSE, CAPILLARY
Glucose-Capillary: 216 mg/dL — ABNORMAL HIGH (ref 70–99)
Glucose-Capillary: 150 mg/dL — ABNORMAL HIGH (ref 70–99)
Glucose-Capillary: 245 mg/dL — ABNORMAL HIGH (ref 70–99)
Glucose-Capillary: 206 mg/dL — ABNORMAL HIGH (ref 70–99)

## 2018-08-20 MED ORDER — INSULIN GLARGINE 100 UNIT/ML ~~LOC~~ SOLN
25.0000 [IU] | Freq: Every day | SUBCUTANEOUS | Status: DC
  Administered 2018-08-20: 25 [IU] via SUBCUTANEOUS
  Filled 2018-08-20 (×2): qty 0.25

## 2018-08-20 NOTE — NC FL2 (Signed)
[place] LEVEL OF CARE SCREENING TOOL     IDENTIFICATION  Patient Name: Cassandra [name] Birthdate: 02/22/1933 Sex: female Admission Date (Current Location): 08/18/2018  [place] and [place] Number:  Engineering geologist and Address:  [facility], [address] [place], [place] [address]      Provider Number: [id number]  Attending Physician Name and Address:  Bettey Costa, MD  Relative Name and Phone Number:  Mikaila, Grunert [phone]  [phone] or Cheek,Paige Daughter [phone]  or      Current Level of Care: Hospital Recommended Level of Care: [place] Prior Approval Number:    Date Approved/Denied:   PASRR Number: [id number]  Discharge Plan: SNF    Current Diagnoses: Patient Active Problem List   Diagnosis Date Noted  . Non-STEMI (non-ST elevated myocardial infarction) ([place]) 08/18/2018  . Lymphedema 03/01/2018  . Acute respiratory failure with hypoxia ([place]) 09/16/2017  . Sepsis ([place]) 08/17/2017  . UTI (urinary tract infection) 08/17/2017  . Acute respiratory distress 08/17/2017  . 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 Sick sinus syndrome ([place]) 07/03/2016  . New onset atrial flutter ([place]) 06/14/2016  . Bradycardia, sinus 06/07/2016  . Syncope 06/06/2016  . Chest pain 04/21/2016  . Elevated troponin 04/21/2016  . Labile hypertension 04/21/2016  . Ischemic chest pain ([place]) 04/21/2016  . Long-term insulin use ([place]) 05/24/2014  . Mild vitamin D deficiency 05/20/2014  . B12 deficiency 02/15/2014  . Acquired hypothyroidism 01/06/2014  . Chronic airway obstruction, not elsewhere classified 01/06/2014  . CAD (coronary artery disease), native coronary artery 01/06/2014  . Benign essential hypertension 01/06/2014  . Headache 01/06/2014  . Hypersomnia with sleep apnea 01/06/2014  . Pure hypercholesterolemia 01/06/2014  . Type II diabetes mellitus with manifestations ([place])  01/06/2014    Orientation RESPIRATION BLADDER Height & Weight     Self, Time, Situation, Place  Other (Comment)(CPAP) Continent Weight: 159 lb 13.3 oz (72.5 kg) Height:  5' (152.4 cm)  BEHAVIORAL SYMPTOMS/MOOD NEUROLOGICAL BOWEL NUTRITION STATUS      Continent Diet(Carb modified)  AMBULATORY STATUS COMMUNICATION OF NEEDS Skin   Limited Assist Verbally Normal                       Personal Care Assistance Level of Assistance  Dressing, Feeding, Bathing Bathing Assistance: Limited assistance Feeding assistance: Independent Dressing Assistance: Limited assistance     Functional Limitations Info  Sight, Hearing, Speech Sight Info: Adequate Hearing Info: Adequate Speech Info: Adequate    SPECIAL CARE FACTORS FREQUENCY  PT (By licensed PT), OT (By licensed OT)     PT Frequency: 5x a week OT Frequency: 5x a week            Contractures Contractures Info: Not present    Additional Factors Info  Code Status, Allergies Code Status Info: Full Code Allergies Info: No Known Allergies            Current Medications (08/20/2018):  This is the current hospital active medication list Current Facility-Administered Medications  Medication Dose Route Frequency Provider Last Rate Last Dose  . 0.9 %  sodium chloride infusion  250 mL Intravenous PRN Pyreddy, Pavan, MD      . 0.9 %  sodium chloride infusion   Intravenous Continuous Saundra [place]ing, MD 75 mL/hr at 08/20/18 0536    . acetaminophen (TYLENOL) tablet 650 mg  650 mg Oral Q4H PRN Saundra [place]ing, MD      . aspirin EC  tablet 81 mg  81 mg Oral Daily Saundra [place]ing, MD   81 mg at 08/20/18 0852  . cholecalciferol (VITAMIN D) tablet 2,000 Units  2,000 Units Oral Daily Saundra [place]ing, MD   2,000 Units at 08/20/18 0853  . citalopram (CELEXA) tablet 40 mg  40 mg Oral QHS Saundra [place]ing, MD   40 mg at 08/19/18 2124  . darifenacin (ENABLEX) 24 hr tablet 7.5 mg  7.5 mg Oral Daily Pyreddy, Pavan, MD   7.5 mg at 08/20/18 0852  .  diazepam (VALIUM) tablet 2 mg  2 mg Oral BID Demetrios Loll, MD      . gabapentin (NEURONTIN) capsule 300 mg  300 mg Oral QHS Pyreddy, Reatha Harps, MD   300 mg at 08/19/18 2124  . hydrALAZINE (APRESOLINE) injection 10 mg  10 mg Intravenous Q4H PRN Saundra [place]ing, MD   10 mg at 08/18/18 1606  . insulin aspart (novoLOG) injection 0-15 Units  0-15 Units Subcutaneous TID WC Saundra [place]ing, MD   2 Units at 08/20/18 1357  . insulin aspart (novoLOG) injection 0-5 Units  0-5 Units Subcutaneous QHS Saundra Shelling, MD   3 Units at 08/19/18 2133  . insulin aspart (novoLOG) injection 6 Units  6 Units Subcutaneous TID WC Demetrios Loll, MD   6 Units at 08/20/18 1357  . insulin glargine (LANTUS) injection 25 Units  25 Units Subcutaneous QHS Mody, Sital, MD      . levothyroxine (SYNTHROID, LEVOTHROID) tablet 112 mcg  112 mcg Oral Q0600 Saundra Shelling, MD   112 mcg at 08/20/18 0549  . lisinopril (PRINIVIL,ZESTRIL) tablet 20 mg  20 mg Oral Daily Pyreddy, Reatha Harps, MD   20 mg at 08/20/18 0852  . metoprolol tartrate (LOPRESSOR) tablet 25 mg  25 mg Oral BID Saundra Shelling, MD   25 mg at 08/20/18 0852  . multivitamin with minerals tablet 1 tablet  1 tablet Oral Daily Saundra Shelling, MD   1 tablet at 08/20/18 0853  . nitroGLYCERIN (NITROSTAT) SL tablet 0.4 mg  0.4 mg Sublingual Q5 Min x 3 PRN Pyreddy, Reatha Harps, MD      . ondansetron (ZOFRAN) injection 4 mg  4 mg Intravenous Q6H PRN Pyreddy, Pavan, MD      . pantoprazole (PROTONIX) EC tablet 40 mg  40 mg Oral Daily Pyreddy, Reatha Harps, MD   40 mg at 08/20/18 0852  . rOPINIRole (REQUIP) tablet 5 mg  5 mg Oral QHS Saundra Shelling, MD   5 mg at 08/19/18 2124  . rosuvastatin (CRESTOR) tablet 10 mg  10 mg Oral QHS Saundra Shelling, MD   10 mg at 08/19/18 2124  . sodium chloride flush (NS) 0.9 % injection 3 mL  3 mL Intravenous Q12H Pyreddy, Reatha Harps, MD   3 mL at 08/18/18 1608  . sodium chloride flush (NS) 0.9 % injection 3 mL  3 mL Intravenous PRN Pyreddy, Reatha Harps, MD      . venlafaxine XR (EFFEXOR-XR) 24  hr capsule 75 mg  75 mg Oral Daily Pyreddy, Reatha Harps, MD   75 mg at 08/20/18 0762     Discharge Medications: Please see discharge summary for a list of discharge medications.  Relevant Imaging Results:  Relevant Lab Results:   Additional Information SSN [id number]  Ross Ludwig, [place]

## 2018-08-20 NOTE — Care Management CC44 (Signed)
Condition Code 44 Documentation Completed  Patient Details  Name: Cassandra [name] MRN: [id number] Date of Birth: [birth year]   Condition Code 44 given:  Yes Patient signature on Condition Code 44 notice:  Yes Documentation of 2 MD's agreement:  Yes Code 44 added to claim:  Yes    Elza Rafter, RN 08/20/2018, 12:03 PM

## 2018-08-20 NOTE — Evaluation (Signed)
Physical Therapy Evaluation Patient Details Name: Cassandra [name] MRN: [id number] DOB: Jun 27, 1933 Today's Date: 08/20/2018   History of Present Illness  84 y.o. female with a known history of hypothyroidism, B12 deficiency, hypertension, breast cancer, chemotherapy in the past, coronary disease, type 2 diabetes mellitus, pacemaker presented to the emergency room for fall from a high stool.  Clinical Impression  Patient with family at bedside, A&Ox4, no complaints of pain and able to confirm PLOF and living situation with family. Pt reported in the last month she has had a decrease in activity due to weakness and falling asleep sporadically. Independent in ADLs, uses 4WW as needed for community ambulation, RW in one story house. Endorses 2 significant falls that have led to ER visits (and this admission).   Patient BP in bed at 105/52. Pt demonstrated bed mobility with supervision and heavy use of bed rails. Sit <> Stand x3 with RW and minAx1. Pt ambulated a total of 39ft during session with modAx1 and RW, close chair follow for safety. Pt demonstrated posterior lean and inability to maintain upright posture without physical assist. Slow, shuffling steps. BP in sitting 92/81 pt asymptomatic, improved to 101/61, HR and SpO2 stable throughout. Overall the patient demonstrated limitations (see "PT Problem List") that impede the patients ability to perform functional activities. The patient would benefit from further skilled PT intervention to address these deficits. Recommendation for SNF due to safety concerns, limitations in mobility and level of assistance needed as well as decreased caregiver support (husband works part time with varying hours).     Follow Up Recommendations SNF    Equipment Recommendations  Other (comment)(TBD, pt has 4WW and RW at home)    Recommendations for Other Services       Precautions / Restrictions Precautions Precautions: Fall Restrictions Weight Bearing  Restrictions: No      Mobility  Bed Mobility Overal bed mobility: Needs Assistance Bed Mobility: Supine to Sit     Supine to sit: Modified independent (Device/Increase time);HOB elevated     General bed mobility comments: Heavy use of bed rails, verbal cues to initiate movements  Transfers Overall transfer level: Needs assistance Equipment used: Rolling walker (2 wheeled) Transfers: Sit to/from Stand Sit to Stand: Mod assist         General transfer comment: x3 this session, heavy use of chair arms 3rd attempt. PT with posterior lean throughout transfer  Ambulation/Gait Ambulation/Gait assistance: +2 safety/equipment;Mod assist Gait Distance (Feet): 4 Feet Assistive device: Rolling walker (2 wheeled) Gait Pattern/deviations: Shuffle Gait velocity: decreased   General Gait Details: Shuffling step, posterior lean, difficulty maintaining upright posture, modAx1 throughout. with MinAx1 pt unable to initiate steps.  Stairs            Wheelchair Mobility    Modified Rankin (Stroke Patients Only)       Balance Overall balance assessment: Needs assistance Sitting-balance support: Feet supported Sitting balance-Leahy Scale: Fair     Standing balance support: No upper extremity supported Standing balance-Leahy Scale: Poor                               Pertinent Vitals/Pain Pain Assessment: No/denies pain    Home Living Family/patient expects to be discharged to:: Private residence Living Arrangements: Spouse/significant other Available Help at Discharge: Family Type of Home: Apartment Home Access: Level entry     Home Layout: One level Home Equipment: Environmental consultant - 2 wheels;Cane - single point;Walker - 4  wheels;Shower seat      Prior Function           Comments: Family and pt reported in the last month pt has a decline in mobility. Previously used 0FH as needed for community distances, RW in home. Reports 2 falls in the last 6 months,  hospital visits after each fall. independent in ADLs.     Hand Dominance        Extremity/Trunk Assessment   Upper Extremity Assessment Upper Extremity Assessment: Generalized weakness(Grossly 4/5)    Lower Extremity Assessment Lower Extremity Assessment: Generalized weakness(grossly 4/5)       Communication   Communication: No difficulties  Cognition Arousal/Alertness: Awake/alert Behavior During Therapy: WFL for tasks assessed/performed Overall Cognitive Status: Within Functional Limits for tasks assessed                                        General Comments      Exercises     Assessment/Plan    PT Assessment Patient needs continued PT services  PT Problem List Decreased strength;Decreased activity tolerance;Decreased knowledge of use of DME;Decreased balance;Decreased safety awareness;Decreased mobility       PT Treatment Interventions DME instruction;Therapeutic exercise;Gait training;Balance training;Stair training;Neuromuscular re-education;Functional mobility training;Therapeutic activities;Patient/family education    PT Goals (Current goals can be found in the Care Plan section)  Acute Rehab PT Goals Patient Stated Goal: Pt would like to go home PT Goal Formulation: With patient Time For Goal Achievement: 09/03/18 Potential to Achieve Goals: Fair    Frequency Min 2X/week   Barriers to discharge Decreased caregiver support      Co-evaluation               AM-PAC PT "6 Clicks" Mobility  Outcome Measure Help needed turning from your back to your side while in a flat bed without using bedrails?: A Lot Help needed moving from lying on your back to sitting on the side of a flat bed without using bedrails?: A Lot Help needed moving to and from a bed to a chair (including a wheelchair)?: A Lot Help needed standing up from a chair using your arms (e.g., wheelchair or bedside chair)?: A Lot Help needed to walk in hospital room?: A  Lot Help needed climbing 3-5 steps with a railing? : Total 6 Click Score: 11    End of Session Equipment Utilized During Treatment: Gait belt Activity Tolerance: Patient limited by fatigue Patient left: with chair alarm set;in chair;with family/visitor present;with call bell/phone within reach Nurse Communication: Mobility status PT Visit Diagnosis: Unsteadiness on feet (R26.81);Difficulty in walking, not elsewhere classified (R26.2);History of falling (Z91.81);Muscle weakness (generalized) (M62.81);Other abnormalities of gait and mobility (R26.89)    Time: 1004-1050 PT Time Calculation (min) (ACUTE ONLY): 46 min   Charges:   PT Evaluation $PT Eval Moderate Complexity: 1 Mod PT Treatments $Therapeutic Activity: 23-37 mins        Lieutenant Diego PT, DPT 1:20 PM,08/20/18 [phone]

## 2018-08-20 NOTE — Clinical Social Work Note (Signed)
Clinical Social Work Assessment  Patient Details  Name: Cassandra [name] MRN: [id number] Date of Birth: 01/21/1933  Date of referral:  08/20/18               Reason for consult:  Facility Placement                Permission sought to share information with:  Family Supports, Customer service manager Permission granted to share information::  Yes, Verbal Permission Granted  Name::     Danyetta, [place] [phone]  [phone]   Agency::  SNF admissions  Relationship::     Contact Information:     Housing/Transportation Living arrangements for the past 2 months:  Single Family Home Source of Information:  Patient, Adult Children, Spouse Patient Interpreter Needed:  None Criminal Activity/Legal Involvement Pertinent to Current Situation/Hospitalization:  No - Comment as needed Significant Relationships:  Adult Children, Spouse Lives with:  Spouse Do you feel safe going back to the place where you live?  No Need for family participation in patient care:  No (Coment)  Care giving concerns:  Patient and husband feels she needs some short term rehab before she is able to return back home.   Social Worker assessment / plan: Patient is an 84 year old female who is alert and oriented x4.  Patient lives with her husband and states she has not been to SNF before for rehab.  CSW explained to patient and her husband how insurance will pay for stay and what the process is for bed search.  CSW informed patient and her husband, insurance may take about 24 hours for approval.  Patient and her husband were explained what to expect at SNF, and what the steps are to get her from hospital to SNF.  Patient would like CSW to begin bed search in [facility], she prefers [facility], [facility], or [facility].  CSW began bed search in [facility], patient and family did not have any other questions or concerns.  Employment status:  Retired Office manager PT Recommendations:  [place] / Referral to community resources:  [place]  Patient/Family's Response to care:  Patient and family agreeable to going to SNF for short term rehab.  Patient/Family's Understanding of and Emotional Response to Diagnosis, Current Treatment, and Prognosis: Patient and family are hopeful that she will not have to be in SNF for very long.  Emotional Assessment Appearance:  Appears stated age Attitude/Demeanor/Rapport:    Affect (typically observed):  Appropriate, Stable Orientation:  Oriented to Self, Oriented to Place, Oriented to  Time, Oriented to Situation Alcohol / Substance use:  Alcohol Use Psych involvement (Current and /or in the community):  No (Comment)  Discharge Needs  Concerns to be addressed:  Lack of Support Readmission within the last 30 days:  No Current discharge risk:  Lack of support system Barriers to Discharge:  Ship broker, Continued Medical Work up   [facility], [place] 08/20/2018, 5:11 PM

## 2018-08-20 NOTE — Progress Notes (Signed)
[facility] [place] NAME: Cassandra [name]    MR#:  [id number]  DATE OF BIRTH:  [birth year]  SUBJECTIVE:  Family at bedside.  Physical therapy is recommending skilled nursing facility.  Patient is feeling weak without focal neurological deficit  REVIEW OF SYSTEMS:    Review of Systems  Constitutional: Negative for fever, chills weight loss HENT: Negative for ear pain, nosebleeds, congestion, facial swelling, rhinorrhea, neck pain, neck stiffness and ear discharge.   Respiratory: Negative for cough, shortness of breath, wheezing  Cardiovascular: Negative for chest pain, palpitations and leg swelling.  Gastrointestinal: Negative for heartburn, abdominal pain, vomiting, diarrhea or consitpation Genitourinary: Negative for dysuria, urgency, frequency, hematuria Musculoskeletal: Negative for back pain or joint pain Neurological: Negative for dizziness, seizures, syncope, focal weakness,  numbness and headaches.  Hematological: Does not bruise/bleed easily.  Psychiatric/Behavioral: Negative for hallucinations, confusion, dysphoric mood    Tolerating Diet: yes      DRUG ALLERGIES:  No Known Allergies  VITALS:  Blood pressure 101/70, pulse 63, temperature (!) 96.2 F (35.7 C), resp. rate 18, height 5' (1.524 m), weight 72.5 kg, SpO2 94 %.  PHYSICAL EXAMINATION:  Constitutional: Appears well-developed and well-nourished. No distress. HENT: Normocephalic. [name] Oropharynx is clear and moist.  Eyes: Conjunctivae and EOM are normal. PERRLA, no scleral icterus.  Neck: Normal ROM. Neck supple. No JVD. No tracheal deviation. CVS: RRR, S1/S2 +, no murmurs, no gallops, no carotid bruit.  Pulmonary: Effort and breath sounds normal, no stridor, rhonchi, wheezes, rales.  Abdominal: Soft. BS +,  no distension, tenderness, rebound or guarding.  Musculoskeletal: Normal range of motion. No edema and no tenderness.  Neuro: Alert. CN 2-12 grossly intact. No  focal deficits. Skin: Skin is warm and dry. No rash noted. Psychiatric: Normal mood and affect.      LABORATORY PANEL:   CBC Recent Labs  Lab 08/19/18 0349  WBC 8.8  HGB 11.2*  HCT 36.0  PLT 241   ------------------------------------------------------------------------------------------------------------------  Chemistries  Recent Labs  Lab 08/18/18 1127 08/19/18 0349  NA 136 134*  K 3.8 3.4*  CL 100 106  CO2 26 22  GLUCOSE 360* 191*  BUN 17 18  CREATININE 0.83 0.94  CALCIUM 8.9 8.0*  MG  --  1.8  AST 17  --   ALT 13  --   ALKPHOS 99  --   BILITOT 0.6  --    ------------------------------------------------------------------------------------------------------------------  Cardiac Enzymes Recent Labs  Lab 08/18/18 1549 08/18/18 2204 08/19/18 0349  TROPONINI 0.57* 0.63* 0.59*   ------------------------------------------------------------------------------------------------------------------  RADIOLOGY:  No results found.   ASSESSMENT AND PLAN:   84 year old female with a history of CAD and diabetes who presented to the emergency room due to a fall  1.  Syncope: This is due to benzodiazepine overuse and EtOH use  Pacemaker was interrogated in the emergency room and there is no dysfunction of the pacemaker.  Plan to taper Valium and Ativan as an outpatient.  2.  Elevated troponin: This is due to demand ischemia Echocardiogram shows normal ejection fraction without wall motion abnormalities.  3.  Sleep apnea: Patient is not compliant with CPAP  4.  Diabetes: Continue sliding scale  5.  Hypothyroidism: Continue Synthroid  6.  Essential hypertension: Continue lisinopril and metoprolol  Physical therapy is recommending skilled nursing facility upon discharge      Management plans discussed with the patient and family and they are in agreement.  CODE STATUS: full  TOTAL TIME TAKING  CARE OF THIS PATIENT: 28 minutes.     POSSIBLE D/C  today or tomorrow, DEPENDING ON CLINICAL CONDITION.   Anjelo Pullman M.D on 08/20/2018 at 1:56 PM  Between 7am to 6pm - Pager - [phone] After 6pm go to [email or web address] - password [email or web address] [facility]  Office  [phone]  CC: Primary care physician; Idelle Crouch, MD  Note: This dictation was prepared with Dragon dictation along with smaller phrase technology. Any transcriptional errors that result from this process are unintentional.

## 2018-08-20 NOTE — Plan of Care (Signed)
  Problem: Nutrition: Goal: Adequate nutrition will be maintained Outcome: Progressing   Problem: Coping: Goal: Level of anxiety will decrease Outcome: Progressing   Problem: Elimination: Goal: Will not experience complications related to urinary retention Outcome: Progressing   Problem: Pain Managment: Goal: General experience of comfort will improve Outcome: Progressing Note:  No complaints of pain this shift   Problem: Safety: Goal: Ability to remain free from injury will improve Outcome: Progressing Note:  Low bed in place   Problem: Skin Integrity: Goal: Risk for impaired skin integrity will decrease Outcome: Progressing   Problem: Education: Goal: Knowledge of General Education information will improve Description Including pain rating scale, medication(s)/side effects and non-pharmacologic comfort measures Outcome: Completed/Met

## 2018-08-20 NOTE — Care Management Obs Status (Signed)
[place] NOTIFICATION   Patient Details  Name: Cassandra [name] MRN: [id number] Date of Birth: [birth year]   Medicare Observation Status Notification Given:  Yes    Elza Rafter, RN 08/20/2018, 12:03 PM

## 2018-08-20 NOTE — Clinical Social Work Note (Signed)
CSW spoke to patient and presented bed offers, she chose [facility] [place].  [facility] contacted [facility] and [facility] can accept patient, and will start insurance auth today.  Jones Broom. [place], MSW, [place]  08/20/2018 5:00 PM

## 2018-08-21 LAB — GLUCOSE, CAPILLARY
Glucose-Capillary: 190 mg/dL — ABNORMAL HIGH (ref 70–99)
Glucose-Capillary: 109 mg/dL — ABNORMAL HIGH (ref 70–99)
Glucose-Capillary: 215 mg/dL — ABNORMAL HIGH (ref 70–99)

## 2018-08-21 MED ORDER — LORAZEPAM 0.5 MG PO TABS: 0.5000 mg | ORAL_TABLET | Freq: Every day | ORAL | 0 refills | Status: DC

## 2018-08-21 MED ORDER — METFORMIN HCL ER 500 MG PO TB24: 1000.0000 mg | ORAL_TABLET | Freq: Two times a day (BID) | ORAL | Status: DC

## 2018-08-21 MED ORDER — METOPROLOL SUCCINATE ER 50 MG PO TB24: 25.0000 mg | ORAL_TABLET | Freq: Every day | ORAL | Status: DC

## 2018-08-21 MED ORDER — DIAZEPAM 2 MG PO TABS: 2.0000 mg | ORAL_TABLET | Freq: Two times a day (BID) | ORAL | 0 refills | Status: DC | PRN

## 2018-08-21 MED ORDER — LISINOPRIL 20 MG PO TABS: 20.0000 mg | ORAL_TABLET | Freq: Every day | ORAL | Status: DC

## 2018-08-21 MED ORDER — INSULIN GLARGINE 100 UNIT/ML ~~LOC~~ SOLN: 50.0000 [IU] | Freq: Every day | SUBCUTANEOUS | Status: DC

## 2018-08-21 NOTE — Discharge Summary (Signed)
[facility] [place] NAME: Cassandra [name]    MR#:  [id number]  DATE OF BIRTH:  [birth year]  DATE OF ADMISSION:  08/18/2018 ADMITTING PHYSICIAN: Saundra Shelling, MD  DATE OF DISCHARGE: 08/21/2018  PRIMARY CARE PHYSICIAN: Idelle Crouch, MD    ADMISSION DIAGNOSIS:  Slurred speech [R47.81] Hyperglycemia [R73.9] NSTEMI (non-ST elevated myocardial infarction) ([place]) [I21.4]  DISCHARGE DIAGNOSIS:  Active Problems:   Elevated troponin   Non-STEMI (non-ST elevated myocardial infarction) ([place])   SECONDARY DIAGNOSIS:   Past Medical History:  Diagnosis Date  . Acquired hypothyroidism 01/06/2014  . Anxiety   . Arthritis   . B12 deficiency 02/15/2014  . Benign essential hypertension 01/06/2014  . Breast cancer [facility]) 1982   Right breast cancer - chemotherapy  . CAD (coronary artery disease), native coronary artery 01/06/2014  . Carotid artery calcification   . Chronic airway obstruction, not elsewhere classified 01/06/2014  . Depression   . Diabetes mellitus without complication ([place])   . History of kidney stones 2019   left ureteral stone  . Incontinence of urine   . Myocardial infarction ([place]) 1982   small infarct  . Pacemaker   . Presence of permanent cardiac pacemaker 07/2016  . Pure hypercholesterolemia 01/06/2014  . Renal insufficiency   . Skin cancer   . Sleep apnea    uses cpap    HOSPITAL COURSE:  84 year old female with a history of CAD and diabetes who presented to the emergency room due to a fall  1.  Syncope: This is due to benzodiazepine overuse with  EtOH use  Pacemaker was interrogated in the emergency room and there is no dysfunction of the pacemaker.  Plan to taper Valium and Ativan as an outpatient.  2.  Elevated troponin: This is due to demand ischemia Echocardiogram shows normal ejection fraction without wall motion abnormalities.  3.  Sleep apnea: Patient is not compliant with CPAP  4.  Diabetes:  Continue ADA diet and outpatient medications  5.  Hypothyroidism: Continue Synthroid  6.  Essential hypertension: Continue lisinopril and metoprolol  Physical therapy is recommending skilled nursing facility upon discharge   DISCHARGE CONDITIONS AND DIET:   Stable Cardiac diet diabetic diet  CONSULTS OBTAINED:    DRUG ALLERGIES:  No Known Allergies  DISCHARGE MEDICATIONS:   Allergies as of 08/21/2018   No Known Allergies     Medication List    STOP taking these medications   HYDROcodone-acetaminophen 5-325 MG tablet Commonly known as:  NORCO/VICODIN   nystatin 100000 UNIT/ML suspension Commonly known as:  MYCOSTATIN     TAKE these medications   aspirin EC 81 MG tablet Take 81 mg by mouth daily.   CARAFATE 1 GM/10ML suspension Generic drug:  sucralfate Take by mouth.   citalopram 40 MG tablet Commonly known as:  CELEXA TAKE ONE TABLET BY MOUTH EVERY NIGHT AT BEDTIME.   diazepam 2 MG tablet Commonly known as:  VALIUM Take 1 tablet (2 mg total) by mouth 2 (two) times daily as needed for anxiety. What changed:    when to take this  reasons to take this   furosemide 40 MG tablet Commonly known as:  LASIX TAKE ONE TABLET TWICE DAILY   gabapentin 300 MG capsule Commonly known as:  NEURONTIN Take by mouth.   HUMALOG KWIKPEN 100 UNIT/ML KwikPen Generic drug:  insulin lispro Inject 12 Units into the skin 3 (three) times daily. *Use as directed per sliding scale*   insulin glargine 100 UNIT/ML  injection Commonly known as:  LANTUS Inject 0.5 mLs (50 Units total) into the skin at bedtime.   levothyroxine 112 MCG tablet Commonly known as:  SYNTHROID, LEVOTHROID Take 1 tablet (112 mcg total) by mouth daily before breakfast.   lisinopril 20 MG tablet Commonly known as:  PRINIVIL,ZESTRIL Take 1 tablet (20 mg total) by mouth daily. Start taking on:  August 22, 2018   LORazepam 0.5 MG tablet Commonly known as:  ATIVAN Take 1 tablet (0.5 mg total)  by mouth at bedtime. What changed:    medication strength  how much to take   metFORMIN 500 MG 24 hr tablet Commonly known as:  GLUCOPHAGE-XR Take 2 tablets (1,000 mg total) by mouth 2 (two) times daily. Please start this on 8/24 What changed:  additional instructions   metoprolol succinate 50 MG 24 hr tablet Commonly known as:  TOPROL-XL Take 1 tablet (50 mg total) by mouth daily. Take with or immediately following a meal. What changed:  how much to take   multivitamin with minerals Tabs tablet Take 1 tablet by mouth daily.   nitroGLYCERIN 0.4 MG SL tablet Commonly known as:  NITROSTAT Place 1 tablet (0.4 mg total) under the tongue every 5 (five) minutes as needed for chest pain.   omeprazole 40 MG capsule Commonly known as:  PRILOSEC Take 40 mg by mouth daily.   ropinirole 5 MG tablet Commonly known as:  REQUIP Take 5 mg by mouth at bedtime.   rosuvastatin 10 MG tablet Commonly known as:  CRESTOR Take 2 tablets (20 mg total) by mouth at bedtime. What changed:  how much to take   tiotropium 18 MCG inhalation capsule Commonly known as:  SPIRIVA Place 1 capsule (18 mcg total) into inhaler and inhale every morning.   venlafaxine XR 75 MG 24 hr capsule Commonly known as:  EFFEXOR-XR Take 75 mg by mouth daily.   VESICARE 10 MG tablet Generic drug:  solifenacin Take 10 mg by mouth daily.   Vitamin D3 50 MCG (2000 UT) Tabs Take 2,000 Units by mouth daily.         Today   CHIEF COMPLAINT:  Doing ok this am   VITAL SIGNS:  Blood pressure (!) 149/64, pulse 60, temperature (!) 97.5 F (36.4 C), temperature source Oral, resp. rate 19, height 5' (1.524 m), weight 72.5 kg, SpO2 94 %.   REVIEW OF SYSTEMS:  Review of Systems  Constitutional: Negative.  Negative for chills, fever and malaise/fatigue.  HENT: Negative.  Negative for ear discharge, ear pain, hearing loss, nosebleeds and sore throat.   Eyes: Negative.  Negative for blurred vision and pain.   Respiratory: Negative.  Negative for cough, hemoptysis, shortness of breath and wheezing.   Cardiovascular: Negative.  Negative for chest pain, palpitations and leg swelling.  Gastrointestinal: Negative.  Negative for abdominal pain, blood in stool, diarrhea, nausea and vomiting.  Genitourinary: Negative.  Negative for dysuria.  Musculoskeletal: Negative.  Negative for back pain.  Skin: Negative.   Neurological: Negative for dizziness, tremors, speech change, focal weakness, seizures and headaches.  Endo/Heme/Allergies: Negative.  Does not bruise/bleed easily.  Psychiatric/Behavioral: Negative.  Negative for depression, hallucinations and suicidal ideas.     PHYSICAL EXAMINATION:  GENERAL:  84 y.o.-year-old patient lying in the bed with no acute distress.  NECK:  Supple, no jugular venous distention. No thyroid enlargement, no tenderness.  LUNGS: Normal breath sounds bilaterally, no wheezing, rales,rhonchi  No use of accessory muscles of respiration.  CARDIOVASCULAR: S1, S2 normal. No  murmurs, rubs, or gallops.  ABDOMEN: Soft, non-tender, non-distended. Bowel sounds present. No organomegaly or mass.  EXTREMITIES: No pedal edema, cyanosis, or clubbing.  PSYCHIATRIC: The patient is alert and oriented x 3.  SKIN: No obvious rash, lesion, or ulcer.   DATA REVIEW:   CBC Recent Labs  Lab 08/19/18 0349  WBC 8.8  HGB 11.2*  HCT 36.0  PLT 241    Chemistries  Recent Labs  Lab 08/18/18 1127 08/19/18 0349  NA 136 134*  K 3.8 3.4*  CL 100 106  CO2 26 22  GLUCOSE 360* 191*  BUN 17 18  CREATININE 0.83 0.94  CALCIUM 8.9 8.0*  MG  --  1.8  AST 17  --   ALT 13  --   ALKPHOS 99  --   BILITOT 0.6  --     Cardiac Enzymes Recent Labs  Lab 08/18/18 1549 08/18/18 2204 08/19/18 0349  TROPONINI 0.57* 0.63* 0.59*    Microbiology Results  @MICRORSLT48 @  RADIOLOGY:  No results found.    Allergies as of 08/21/2018   No Known Allergies     Medication List    STOP  taking these medications   HYDROcodone-acetaminophen 5-325 MG tablet Commonly known as:  NORCO/VICODIN   nystatin 100000 UNIT/ML suspension Commonly known as:  MYCOSTATIN     TAKE these medications   aspirin EC 81 MG tablet Take 81 mg by mouth daily.   CARAFATE 1 GM/10ML suspension Generic drug:  sucralfate Take by mouth.   citalopram 40 MG tablet Commonly known as:  CELEXA TAKE ONE TABLET BY MOUTH EVERY NIGHT AT BEDTIME.   diazepam 2 MG tablet Commonly known as:  VALIUM Take 1 tablet (2 mg total) by mouth 2 (two) times daily as needed for anxiety. What changed:    when to take this  reasons to take this   furosemide 40 MG tablet Commonly known as:  LASIX TAKE ONE TABLET TWICE DAILY   gabapentin 300 MG capsule Commonly known as:  NEURONTIN Take by mouth.   HUMALOG KWIKPEN 100 UNIT/ML KwikPen Generic drug:  insulin lispro Inject 12 Units into the skin 3 (three) times daily. *Use as directed per sliding scale*   insulin glargine 100 UNIT/ML injection Commonly known as:  LANTUS Inject 0.5 mLs (50 Units total) into the skin at bedtime.   levothyroxine 112 MCG tablet Commonly known as:  SYNTHROID, LEVOTHROID Take 1 tablet (112 mcg total) by mouth daily before breakfast.   lisinopril 20 MG tablet Commonly known as:  PRINIVIL,ZESTRIL Take 1 tablet (20 mg total) by mouth daily. Start taking on:  August 22, 2018   LORazepam 0.5 MG tablet Commonly known as:  ATIVAN Take 1 tablet (0.5 mg total) by mouth at bedtime. What changed:    medication strength  how much to take   metFORMIN 500 MG 24 hr tablet Commonly known as:  GLUCOPHAGE-XR Take 2 tablets (1,000 mg total) by mouth 2 (two) times daily. Please start this on 8/24 What changed:  additional instructions   metoprolol succinate 50 MG 24 hr tablet Commonly known as:  TOPROL-XL Take 1 tablet (50 mg total) by mouth daily. Take with or immediately following a meal. What changed:  how much to take    multivitamin with minerals Tabs tablet Take 1 tablet by mouth daily.   nitroGLYCERIN 0.4 MG SL tablet Commonly known as:  NITROSTAT Place 1 tablet (0.4 mg total) under the tongue every 5 (five) minutes as needed for chest pain.   omeprazole  40 MG capsule Commonly known as:  PRILOSEC Take 40 mg by mouth daily.   ropinirole 5 MG tablet Commonly known as:  REQUIP Take 5 mg by mouth at bedtime.   rosuvastatin 10 MG tablet Commonly known as:  CRESTOR Take 2 tablets (20 mg total) by mouth at bedtime. What changed:  how much to take   tiotropium 18 MCG inhalation capsule Commonly known as:  SPIRIVA Place 1 capsule (18 mcg total) into inhaler and inhale every morning.   venlafaxine XR 75 MG 24 hr capsule Commonly known as:  EFFEXOR-XR Take 75 mg by mouth daily.   VESICARE 10 MG tablet Generic drug:  solifenacin Take 10 mg by mouth daily.   Vitamin D3 50 MCG (2000 UT) Tabs Take 2,000 Units by mouth daily.          Management plans discussed with the patient and she is in agreement. Stable for discharge   Patient should follow up with pcp  CODE STATUS:     Code Status Orders  (From admission, onward)         Start     Ordered   08/18/18 1540  Full code  Continuous     08/18/18 1539        Code Status History    Date Active Date Inactive Code Status Order ID Comments User Context   02/03/2018 1644 02/03/2018 2036 Full Code [id number]  Hessie Knows, MD Inpatient   11/07/2017 1658 11/08/2017 1800 Full Code [id number]  Nicholes Mango, MD Inpatient   09/16/2017 1423 09/23/2017 1525 Full Code [id number]  Fritzi Mandes, MD Inpatient   08/17/2017 1958 08/20/2017 1447 Full Code [id number]  Idelle Crouch, MD Inpatient   07/03/2016 1637 07/04/2016 1255 Full Code [id number]  Isaias Cowman, MD Inpatient   06/14/2016 2344 06/16/2016 1358 Full Code [id number]  Harvie Bridge, DO Inpatient   06/07/2016 0128 06/08/2016 1512 Full Code [id number]  Lance Coon, MD Inpatient    04/23/2016 1404 04/24/2016 1435 Full Code [id number]  Yolonda Kida, MD Inpatient   04/21/2016 2018 04/22/2016 0859 Full Code [id number]  Idelle Crouch, MD Inpatient    Advance Directive Documentation     Most Recent Value  Type of Advance Directive  Healthcare Power of Attorney, Living will  Pre-existing out of facility DNR order (yellow form or pink MOST form)  -  "MOST" Form in Place?  -      TOTAL TIME TAKING CARE OF THIS PATIENT: 38 minutes.    Note: This dictation was prepared with Dragon dictation along with smaller phrase technology. Any transcriptional errors that result from this process are unintentional.  Marlene Beidler M.D on 08/21/2018 at 10:23 AM  Between 7am to 6pm - Pager - [phone] After 6pm go to [email or web address] - password [email or web address]  Office  [phone]  CC: Primary care physician; Idelle Crouch, MD

## 2018-08-21 NOTE — Progress Notes (Signed)
Pt to d/c to [facility], Report given to [place], LPN, [facility] called for transportation.

## 2018-08-21 NOTE — Progress Notes (Signed)
Patient was escorted by [facility] to [facility]. Patient is in agreement with the care plan patient is A&O x4. No reports of pain.

## 2018-08-21 NOTE — Clinical Social Work Note (Addendum)
CSW received phone call that patient has been approved to go to [facility] [place].  CSW updated patient and her husband.  Patient to be d/c'ed today to [facility] [place] room 501.  Patient and family agreeable to plans will transport via ems RN to call report to [phone].  Patient's husband was at bedside and is aware of patient discharging today.  Evette Cristal, MSW, [place]

## 2018-08-21 NOTE — Progress Notes (Signed)
I called to [facility] to verify pt is on their list, Per EMS they are running behind today and stated she could not give me a ET time but pt is on their list for transport. Pt and husband aware.  Will pass on to oncoming nurse.

## 2018-08-21 NOTE — Clinical Social Work Placement (Signed)
   CLINICAL SOCIAL WORK PLACEMENT  NOTE  Date:  08/21/2018  Patient Details  Name: Cassandra [name] MRN: [id number] Date of Birth: [birth year]  Clinical Social Work is seeking post-discharge placement for this patient at the [place] level of care (*CSW will initial, date and re-position this form in  chart as items are completed):  Yes   Patient/family provided with [facility] Work Department's list of facilities offering this level of care within the geographic area requested by the patient (or if unable, by the patient's family).  Yes   Patient/family informed of their freedom to choose among providers that offer the needed level of care, that participate in Medicare, Medicaid or managed care program needed by the patient, have an available bed and are willing to accept the patient.  Yes   Patient/family informed of [facility]'s ownership interest in [facility] and [facility], as well as of the fact that they are under no obligation to receive care at these facilities.  PASRR submitted to EDS on 08/20/18     PASRR number received on 08/20/18     Existing PASRR number confirmed on       FL2 transmitted to all facilities in geographic area requested by pt/family on 08/20/18     FL2 transmitted to all facilities within larger geographic area on       Patient informed that his/her managed care company has contracts with or will negotiate with certain facilities, including the following:        Yes   Patient/family informed of bed offers received.  Patient chooses bed at [facility]     Physician recommends and patient chooses bed at      Patient to be transferred to [facility] on 08/21/18.  Patient to be transferred to facility by [facility]     Patient family notified on 08/21/18 of transfer.  Name of family member notified:  [name] patient's husband was at bedside and is aware of patient  discharging today.     PHYSICIAN Please prepare prescriptions, Please sign FL2     Additional Comment:    _______________________________________________ Ross Ludwig, LCSWA 08/21/2018, 1:48 PM

## 2018-08-31 ENCOUNTER — Other Ambulatory Visit: Payer: Self-pay

## 2018-08-31 ENCOUNTER — Encounter (INDEPENDENT_AMBULATORY_CARE_PROVIDER_SITE_OTHER): Payer: Self-pay | Admitting: Vascular Surgery

## 2018-08-31 ENCOUNTER — Ambulatory Visit (INDEPENDENT_AMBULATORY_CARE_PROVIDER_SITE_OTHER): Payer: Medicare Other | Admitting: Vascular Surgery

## 2018-08-31 VITALS — BP 91/61 | HR 85 | Resp 14 | Ht 62.0 in | Wt 177.0 lb

## 2018-08-31 DIAGNOSIS — Z87891 Personal history of nicotine dependence: Secondary | ICD-10-CM

## 2018-08-31 DIAGNOSIS — E118 Type 2 diabetes mellitus with unspecified complications: Secondary | ICD-10-CM

## 2018-08-31 DIAGNOSIS — I890 Lymphedema, not elsewhere classified: Secondary | ICD-10-CM

## 2018-08-31 DIAGNOSIS — E7800 Pure hypercholesterolemia, unspecified: Secondary | ICD-10-CM

## 2018-08-31 DIAGNOSIS — I25118 Atherosclerotic heart disease of native coronary artery with other forms of angina pectoris: Secondary | ICD-10-CM

## 2018-08-31 DIAGNOSIS — I10 Essential (primary) hypertension: Secondary | ICD-10-CM

## 2018-08-31 NOTE — Progress Notes (Signed)
MRN : [id number]  Cassandra [name] is a 84 y.o. ([birth year]) female who presents with chief complaint of  Chief Complaint  Patient presents with  . Follow-up  .  History of Present Illness:   The patient returns to the office for followup evaluation regarding leg swelling.  The swelling has persisted but with the lymph pump is much, much better controlled. The pain associated with swelling is essentially eliminated. There have not been any interval development of a ulcerations or wounds.  The patient denies problems with the pump, noting it is working well and the leggings are in good condition.  Since the previous visit the patient has been wearing graduated compression stockings and using the lymph pump on a routine basis and  has noted significant improvement in the lymphedema.   Patient stated the lymph pump has been a very positive factor in her care.    Current Meds  Medication Sig  . aspirin EC 81 MG tablet Take 81 mg by mouth daily.  . Cholecalciferol (VITAMIN D3) 2000 units TABS Take 2,000 Units by mouth daily.  . citalopram (CELEXA) 40 MG tablet TAKE ONE TABLET BY MOUTH EVERY NIGHT AT BEDTIME.  . furosemide (LASIX) 40 MG tablet TAKE ONE TABLET TWICE DAILY  . gabapentin (NEURONTIN) 300 MG capsule Take by mouth.  [name] HUMALOG KWIKPEN 100 UNIT/ML KiwkPen Inject 12 Units into the skin 3 (three) times daily. *Use as directed per sliding scale*  . insulin glargine (LANTUS) 100 UNIT/ML injection Inject 0.5 mLs (50 Units total) into the skin at bedtime.  [name] levothyroxine (SYNTHROID, LEVOTHROID) 112 MCG tablet Take 1 tablet (112 mcg total) by mouth daily before breakfast.  . lisinopril (PRINIVIL,ZESTRIL) 20 MG tablet Take 1 tablet (20 mg total) by mouth daily.  [name] LORazepam (ATIVAN) 0.5 MG tablet Take 1 tablet (0.5 mg total) by mouth at bedtime.  . metFORMIN (GLUCOPHAGE-XR) 500 MG 24 hr tablet Take 2 tablets (1,000 mg total) by mouth 2 (two) times daily. Please start this on 8/24  .  metoprolol succinate (TOPROL-XL) 50 MG 24 hr tablet Take 1 tablet (50 mg total) by mouth daily. Take with or immediately following a meal.  . Multiple Vitamin (MULTIVITAMIN WITH MINERALS) TABS tablet Take 1 tablet by mouth daily.  . nitroGLYCERIN (NITROSTAT) 0.4 MG SL tablet Place 1 tablet (0.4 mg total) under the tongue every 5 (five) minutes as needed for chest pain.  [name] omeprazole (PRILOSEC) 40 MG capsule Take 40 mg by mouth daily.   . ropinirole (REQUIP) 5 MG tablet Take 5 mg by mouth at bedtime.   . rosuvastatin (CRESTOR) 10 MG tablet Take 2 tablets (20 mg total) by mouth at bedtime.  . solifenacin (VESICARE) 10 MG tablet Take 10 mg by mouth daily.   . sucralfate (CARAFATE) 1 GM/10ML suspension Take by mouth.  . tiotropium (SPIRIVA) 18 MCG inhalation capsule Place 1 capsule (18 mcg total) into inhaler and inhale every morning.  . venlafaxine XR (EFFEXOR-XR) 75 MG 24 hr capsule Take 75 mg by mouth daily.  . [DISCONTINUED] diazepam (VALIUM) 2 MG tablet Take 1 tablet (2 mg total) by mouth 2 (two) times daily as needed for anxiety.    Past Medical History:  Diagnosis Date  . Acquired hypothyroidism 01/06/2014  . Anxiety   . Arthritis   . B12 deficiency 02/15/2014  . Benign essential hypertension 01/06/2014  . Breast cancer [facility]) 1982   Right breast cancer - chemotherapy  . CAD (coronary artery disease), native coronary artery  01/06/2014  . Carotid artery calcification   . Chronic airway obstruction, not elsewhere classified 01/06/2014  . Depression   . Diabetes mellitus without complication ([place])   . History of kidney stones 2019   left ureteral stone  . Incontinence of urine   . Myocardial infarction ([place]) 1982   small infarct  . Pacemaker   . Presence of permanent cardiac pacemaker 07/2016  . Pure hypercholesterolemia 01/06/2014  . Renal insufficiency   . Skin cancer   . Sleep apnea    uses cpap    Past Surgical History:  Procedure Laterality Date  . APPENDECTOMY    . AUGMENTATION  MAMMAPLASTY Bilateral 1982/redo in 2014   prior mastectomy  . CARDIAC CATHETERIZATION N/A 04/23/2016   Procedure: Left Heart Cath and Coronary Angiography;  Surgeon: Corey Skains, MD;  Location: [place] [facility];  Service: Cardiovascular;  Laterality: N/A;  . CARDIAC CATHETERIZATION Left 04/23/2016   Procedure: Coronary Stent Intervention;  Surgeon: Yolonda Kida, MD;  Location: [place] [facility];  Service: Cardiovascular;  Laterality: Left;  . CAROTID ARTERY ANGIOPLASTY Left 2010   had stent inserted and removed d/t infection. artery from leg inserted in left carotid  . CAROTID ENDARTERECTOMY Left 2010  . CHOLECYSTECTOMY    . CORONARY ANGIOPLASTY WITH STENT PLACEMENT  september 9th 2017  . CYSTOSCOPY W/ URETERAL STENT PLACEMENT Left 08/17/2017   Procedure: CYSTOSCOPY WITH RETROGRADE PYELOGRAM/URETERAL STENT PLACEMENT;  Surgeon: Festus Aloe, MD;  Location: [facility];  Service: Urology;  Laterality: Left;  . CYSTOSCOPY W/ URETERAL STENT PLACEMENT Left 09/16/2017   Procedure: CYSTOSCOPY WITH STENT REPLACEMENT;  Surgeon: Abbie Sons, MD;  Location: [facility];  Service: Urology;  Laterality: Left;  . CYSTOSCOPY/RETROGRADE/URETEROSCOPY/STONE EXTRACTION WITH BASKET Left 09/16/2017   Procedure: CYSTOSCOPY/RETROGRADE/URETEROSCOPY/STONE EXTRACTION WITH BASKET;  Surgeon: Abbie Sons, MD;  Location: [facility];  Service: Urology;  Laterality: Left;  . EYE SURGERY Bilateral    cataract extraction  . KYPHOPLASTY N/A 02/03/2018   Procedure: [id number];  Surgeon: Hessie Knows, MD;  Location: [facility];  Service: Orthopedics;  Laterality: N/A;  . MASTECTOMY Right 1982  . PACEMAKER INSERTION Left 07/03/2016   Procedure: INSERTION PACEMAKER;  Surgeon: Isaias Cowman, MD;  Location: [facility];  Service: Cardiovascular;  Laterality: Left;  . SKIN CANCER EXCISION    . TUBAL LIGATION      Social History Social History   Tobacco Use  . Smoking status: Former Smoker     Packs/day: 0.50    Types: Cigarettes    Last attempt to quit: 09/02/1996    Years since quitting: 22.0  . Smokeless tobacco: Never Used  Substance Use Topics  . Alcohol use: Yes    Alcohol/week: 1.0 standard drinks    Types: 1 Glasses of wine per week    Comment: occasionally  . Drug use: No    Family History Family History  Problem Relation Age of Onset  . Prostate cancer Neg Hx   . Kidney cancer Neg Hx   . Breast cancer Neg Hx     No Known Allergies   REVIEW OF SYSTEMS (Negative unless checked)  Constitutional: [] Weight loss  [] Fever  [] Chills Cardiac: [] Chest pain   [] Chest pressure   [] Palpitations   [] Shortness of breath when laying flat   [x] Shortness of breath with exertion. Vascular:  [] Pain in legs with walking   [x] Pain in legs with dependency  [] History of DVT   [] Phlebitis   [x] Swelling in legs   [] Varicose veins   []   Non-healing ulcers Pulmonary:   [] Uses home oxygen   [] Productive cough   [] Hemoptysis   [] Wheeze  [] COPD   [] Asthma Neurologic:  [] Dizziness   [] Seizures   [] History of stroke   [] History of TIA  [] Aphasia   [] Vissual changes   [] Weakness or numbness in arm   [] Weakness or numbness in leg Musculoskeletal:   [] Joint swelling   [x] Joint pain   [] Low back pain Hematologic:  [] Easy bruising  [] Easy bleeding   [] Hypercoagulable state   [] Anemic Gastrointestinal:  [] Diarrhea   [] Vomiting  [] Gastroesophageal reflux/heartburn   [] Difficulty swallowing. Genitourinary:  [] Chronic kidney disease   [] Difficult urination  [] Frequent urination   [] Blood in urine Skin:  [] Rashes   [] Ulcers  Psychological:  [] History of anxiety   []  History of major depression.  Physical Examination  Vitals:   08/31/18 1438  BP: 91/61  Pulse: 85  Resp: 14  Weight: 177 lb (80.3 kg)  Height: 5\' 2"  (1.575 m)   Body mass index is 32.37 kg/m. Gen: WD/WN, NAD Head: [place]/AT, No temporalis wasting.  Ear/Nose/Throat: Hearing grossly intact, nares w/o erythema or drainage Eyes: PER,  EOMI, sclera nonicteric.  Neck: Supple, no large masses.   Pulmonary:  Good air movement, no audible wheezing bilaterally, no use of accessory muscles.  Cardiac: RRR, no JVD Vascular: scattered varicosities present bilaterally.  Mild-moderate venous stasis changes to the legs bilaterally.  2-3+ soft pitting edema left > right Vessel Right Left  Radial Palpable Palpable  PT Palpable Palpable  DP Palpable Palpable  Gastrointestinal: Non-distended. No guarding/no peritoneal signs.  Musculoskeletal: M/S 5/5 throughout.  No deformity or atrophy.  Neurologic: CN 2-12 intact. Symmetrical.  Speech is fluent. Motor exam as listed above. Psychiatric: Judgment intact, Mood & affect appropriate for pt's clinical situation. Dermatologic: Venous rashes no ulcers noted.  No changes consistent with cellulitis. Lymph : No lichenification or skin changes of chronic lymphedema.  CBC Lab Results  Component Value Date   WBC 8.8 08/19/2018   HGB 11.2 (L) 08/19/2018   HCT 36.0 08/19/2018   MCV 85.1 08/19/2018   PLT 241 08/19/2018    BMET    Component Value Date/Time   NA 134 (L) 08/19/2018 0349   NA 140 02/23/2014 1406   K 3.4 (L) 08/19/2018 0349   K 3.9 02/23/2014 1406   CL 106 08/19/2018 0349   CL 103 02/23/2014 1406   CO2 22 08/19/2018 0349   CO2 30 02/23/2014 1406   GLUCOSE 191 (H) 08/19/2018 0349   GLUCOSE 70 02/23/2014 1406   BUN 18 08/19/2018 0349   BUN 16 02/23/2014 1406   CREATININE 0.94 08/19/2018 0349   CREATININE 0.75 02/23/2014 1406   CALCIUM 8.0 (L) 08/19/2018 0349   CALCIUM 9.3 02/23/2014 1406   GFRNONAA 56 (L) 08/19/2018 0349   GFRNONAA >60 02/23/2014 1406   GFRAA >60 08/19/2018 0349   GFRAA >60 02/23/2014 1406   Estimated Creatinine Clearance: 44.5 mL/min (by C-G formula based on SCr of 0.94 mg/dL).  COAG Lab Results  Component Value Date   INR 0.99 08/18/2018   INR 1.22 08/17/2017   INR 1.01 06/27/2016    Radiology Dg Chest 2 View  Result Date:  08/18/2018 CLINICAL DATA:  Status post fall.  Fell from standing. EXAM: CHEST - 2 VIEW COMPARISON:  02/03/2018 FINDINGS: There is mild bilateral interstitial thickening. There is a trace pleural effusion bilaterally. There is no focal consolidation or pneumothorax. There is mild stable cardiomegaly. There is a dual lead cardiac pacemaker. The  heart and mediastinal contours are unremarkable. The osseous structures are unremarkable. IMPRESSION: Cardiomegaly with mild pulmonary vascular congestion. Likely underlying mild chronic interstitial lung disease. Electronically Signed   By: Kathreen Devoid   On: 08/18/2018 12:19   Dg Thoracic Spine 2 View  Result Date: 08/18/2018 CLINICAL DATA:  Fall with back pain. EXAM: THORACIC SPINE 2 VIEWS COMPARISON:  Abdomen and pelvis CT 01/07/2018 FINDINGS: T11 compression fracture and cement augmentation. Remote L1 inferior endplate fracture. No evidence of acute fracture or traumatic malalignment. Posterior mediastinal fat planes are preserved. Chronic interstitial coarsening. Chronic cardiomegaly with dual-chamber pacer. IMPRESSION: 1. No acute finding. 2. Remote T11 and L1 compression fractures. Electronically Signed   By: Monte Fantasia M.D.   On: 08/18/2018 12:17   Dg Pelvis 1-2 Views  Result Date: 08/18/2018 CLINICAL DATA:  Fall at home.  Confusion. EXAM: PELVIS - 1-2 VIEW COMPARISON:  Abdomen and pelvis CT 01/07/2018 FINDINGS: There is no evidence of pelvic fracture or diastasis. No pelvic bone lesions are seen. Generalized osteopenia. IMPRESSION: Negative. Electronically Signed   By: Monte Fantasia M.D.   On: 08/18/2018 12:18   Ct Head Wo Contrast  Result Date: 08/18/2018 CLINICAL DATA:  Confusion today. Status post fall today. Initial encounter. EXAM: CT HEAD WITHOUT CONTRAST TECHNIQUE: Contiguous axial images were obtained from the base of the skull through the vertex without intravenous contrast. COMPARISON:  Head CT scan 07/15/2018 and 08/04/2017. FINDINGS:  Brain: No evidence of acute infarction, hemorrhage, hydrocephalus, extra-axial collection or mass lesion/mass effect. There is some atrophy and chronic microvascular ischemic change. Vascular: Atherosclerosis noted. Skull: Intact.  No focal lesion. Sinuses/Orbits: Negative. Other: None. IMPRESSION: No acute abnormality. Electronically Signed   By: Inge Rise M.D.   On: 08/18/2018 11:50     Assessment/Plan 1. Lymphedema  No surgery or intervention at this point in time.    I have reviewed my discussion with the patient regarding lymphedema and why it  causes symptoms.  Patient will continue wearing graduated compression stockings class 1 (20-30 mmHg) on a daily basis a prescription was given. The patient is reminded to put the stockings on first thing in the morning and removing them in the evening. The patient is instructed specifically not to sleep in the stockings.   In addition, behavioral modification throughout the day will be continued.  This will include frequent elevation (such as in a recliner), use of over the counter pain medications as needed and exercise such as walking.  I have reviewed systemic causes for chronic edema such as liver, kidney and cardiac etiologies and there does not appear to be any significant changes in these organ systems over the past year.  The patient is under the impression that these organ systems are all stable and unchanged.    The patient will continue aggressive use of the  lymph pump.  This will continue to improve the edema control and prevent sequela such as ulcers and infections.   The patient will follow-up with me on an annual basis.    2. Type II diabetes mellitus with manifestations ([place]) Continue hypoglycemic medications as already ordered, these medications have been reviewed and there are no changes at this time.  Hgb A1C to be monitored as already arranged by primary service   3. Pure hypercholesterolemia Continue statin as  ordered and reviewed, no changes at this time   4. Coronary artery disease of native artery of native heart with stable angina pectoris ([facility]) Continue cardiac and antihypertensive medications as already  ordered and reviewed, no changes at this time.  Continue statin as ordered and reviewed, no changes at this time  Nitrates PRN for chest pain   5. Benign essential hypertension Continue antihypertensive medications as already ordered, these medications have been reviewed and there are no changes at this time.    Hortencia Pilar, MD  08/31/2018 3:21 PM

## 2018-10-04 ENCOUNTER — Encounter: Payer: Self-pay | Admitting: Emergency Medicine

## 2018-10-04 ENCOUNTER — Emergency Department: Payer: Medicare Other

## 2018-10-04 ENCOUNTER — Observation Stay
Admission: EM | Admit: 2018-10-04 | Discharge: 2018-10-05 | Disposition: A | Discharge status: Home / Self Care | Payer: Medicare Other | Attending: Family Medicine | Admitting: Family Medicine

## 2018-10-04 DIAGNOSIS — Z79899 Other long term (current) drug therapy: Secondary | ICD-10-CM | POA: Diagnosis not present

## 2018-10-04 DIAGNOSIS — R7989 Other specified abnormal findings of blood chemistry: Secondary | ICD-10-CM | POA: Insufficient documentation

## 2018-10-04 DIAGNOSIS — R55 Syncope and collapse: Secondary | ICD-10-CM | POA: Diagnosis present

## 2018-10-04 DIAGNOSIS — I252 Old myocardial infarction: Secondary | ICD-10-CM | POA: Diagnosis not present

## 2018-10-04 DIAGNOSIS — E7800 Pure hypercholesterolemia, unspecified: Secondary | ICD-10-CM | POA: Diagnosis not present

## 2018-10-04 DIAGNOSIS — I495 Sick sinus syndrome: Secondary | ICD-10-CM | POA: Insufficient documentation

## 2018-10-04 DIAGNOSIS — E039 Hypothyroidism, unspecified: Secondary | ICD-10-CM | POA: Insufficient documentation

## 2018-10-04 DIAGNOSIS — Z853 Personal history of malignant neoplasm of breast: Secondary | ICD-10-CM | POA: Insufficient documentation

## 2018-10-04 DIAGNOSIS — J449 Chronic obstructive pulmonary disease, unspecified: Secondary | ICD-10-CM | POA: Insufficient documentation

## 2018-10-04 DIAGNOSIS — Z950 Presence of cardiac pacemaker: Secondary | ICD-10-CM | POA: Insufficient documentation

## 2018-10-04 DIAGNOSIS — Z794 Long term (current) use of insulin: Secondary | ICD-10-CM | POA: Diagnosis not present

## 2018-10-04 DIAGNOSIS — M6281 Muscle weakness (generalized): Secondary | ICD-10-CM | POA: Diagnosis not present

## 2018-10-04 DIAGNOSIS — I10 Essential (primary) hypertension: Secondary | ICD-10-CM | POA: Insufficient documentation

## 2018-10-04 DIAGNOSIS — E119 Type 2 diabetes mellitus without complications: Secondary | ICD-10-CM | POA: Diagnosis not present

## 2018-10-04 DIAGNOSIS — N179 Acute kidney failure, unspecified: Principal | ICD-10-CM | POA: Insufficient documentation

## 2018-10-04 DIAGNOSIS — Z7982 Long term (current) use of aspirin: Secondary | ICD-10-CM | POA: Diagnosis not present

## 2018-10-04 DIAGNOSIS — I2510 Atherosclerotic heart disease of native coronary artery without angina pectoris: Secondary | ICD-10-CM | POA: Insufficient documentation

## 2018-10-04 DIAGNOSIS — F419 Anxiety disorder, unspecified: Secondary | ICD-10-CM | POA: Diagnosis not present

## 2018-10-04 DIAGNOSIS — F329 Major depressive disorder, single episode, unspecified: Secondary | ICD-10-CM | POA: Diagnosis not present

## 2018-10-04 DIAGNOSIS — E785 Hyperlipidemia, unspecified: Secondary | ICD-10-CM | POA: Diagnosis not present

## 2018-10-04 DIAGNOSIS — R531 Weakness: Secondary | ICD-10-CM

## 2018-10-04 DIAGNOSIS — G4730 Sleep apnea, unspecified: Secondary | ICD-10-CM | POA: Diagnosis not present

## 2018-10-04 DIAGNOSIS — R778 Other specified abnormalities of plasma proteins: Secondary | ICD-10-CM

## 2018-10-04 LAB — CBC
HCT: 36.6 % (ref 36.0–46.0)
Hemoglobin: 11.7 g/dL — ABNORMAL LOW (ref 12.0–15.0)
MCH: 27.2 pg (ref 26.0–34.0)
MCHC: 32 g/dL (ref 30.0–36.0)
MCV: 85.1 fL (ref 80.0–100.0)
Platelets: 266 10*3/uL (ref 150–400)
RBC: 4.3 MIL/uL (ref 3.87–5.11)
RDW: 15.2 % (ref 11.5–15.5)
WBC: 11.2 10*3/uL — ABNORMAL HIGH (ref 4.0–10.5)
nRBC: 0 % (ref 0.0–0.2)

## 2018-10-04 LAB — COMPREHENSIVE METABOLIC PANEL
ALT: 11 U/L (ref 0–44)
ANION GAP: 13 (ref 5–15)
AST: 17 U/L (ref 15–41)
Albumin: 3.4 g/dL — ABNORMAL LOW (ref 3.5–5.0)
Alkaline Phosphatase: 50 U/L (ref 38–126)
BUN: 37 mg/dL — ABNORMAL HIGH (ref 8–23)
CALCIUM: 9 mg/dL (ref 8.9–10.3)
CO2: 24 mmol/L (ref 22–32)
CREATININE: 1.48 mg/dL — AB (ref 0.44–1.00)
Chloride: 100 mmol/L (ref 98–111)
GFR calc non Af Amer: 32 mL/min — ABNORMAL LOW (ref >60)
GFR, EST AFRICAN AMERICAN: 37 mL/min — AB (ref >60)
Glucose, Bld: 216 mg/dL — ABNORMAL HIGH (ref 70–99)
POTASSIUM: 3.9 mmol/L (ref 3.5–5.1)
Sodium: 137 mmol/L (ref 135–145)
Total Bilirubin: 0.5 mg/dL (ref 0.3–1.2)
Total Protein: 6.5 g/dL (ref 6.5–8.1)

## 2018-10-04 LAB — TROPONIN I: Troponin I: 0.7 ng/mL (ref <0.03)

## 2018-10-04 MED ORDER — ACETAMINOPHEN 325 MG PO TABS: 650.0000 mg | ORAL_TABLET | Freq: Four times a day (QID) | ORAL | Status: DC | PRN

## 2018-10-04 MED ORDER — INSULIN GLARGINE 100 UNIT/ML ~~LOC~~ SOLN
50.0000 [IU] | Freq: Every day | SUBCUTANEOUS | Status: DC
  Administered 2018-10-05: 42 [IU] via SUBCUTANEOUS
  Filled 2018-10-04 (×2): qty 0.5

## 2018-10-04 MED ORDER — METOPROLOL SUCCINATE ER 25 MG PO TB24
25.0000 mg | ORAL_TABLET | Freq: Every day | ORAL | Status: DC
  Administered 2018-10-05: 25 mg via ORAL
  Filled 2018-10-04: qty 1

## 2018-10-04 MED ORDER — ACETAMINOPHEN 650 MG RE SUPP: 650.0000 mg | Freq: Four times a day (QID) | RECTAL | Status: DC | PRN

## 2018-10-04 MED ORDER — LORAZEPAM 0.5 MG PO TABS: 0.5000 mg | ORAL_TABLET | Freq: Every day | ORAL | Status: DC

## 2018-10-04 MED ORDER — INSULIN ASPART 100 UNIT/ML ~~LOC~~ SOLN: 0.0000 [IU] | Freq: Every day | SUBCUTANEOUS | Status: DC

## 2018-10-04 MED ORDER — SODIUM CHLORIDE 0.9 % IV SOLN
INTRAVENOUS | Status: AC
  Administered 2018-10-05: 01:00:00 via INTRAVENOUS

## 2018-10-04 MED ORDER — ONDANSETRON HCL 4 MG PO TABS: 4.0000 mg | ORAL_TABLET | Freq: Four times a day (QID) | ORAL | Status: DC | PRN

## 2018-10-04 MED ORDER — PIPERACILLIN-TAZOBACTAM 3.375 G IVPB 30 MIN: 3.3750 g | Freq: Once | INTRAVENOUS | Status: DC

## 2018-10-04 MED ORDER — VENLAFAXINE HCL ER 75 MG PO CP24
75.0000 mg | ORAL_CAPSULE | Freq: Every day | ORAL | Status: DC
  Filled 2018-10-04: qty 1

## 2018-10-04 MED ORDER — TIOTROPIUM BROMIDE MONOHYDRATE 18 MCG IN CAPS
18.0000 ug | ORAL_CAPSULE | Freq: Every morning | RESPIRATORY_TRACT | Status: DC
  Filled 2018-10-04: qty 5

## 2018-10-04 MED ORDER — BISACODYL 5 MG PO TBEC: 5.0000 mg | DELAYED_RELEASE_TABLET | Freq: Every day | ORAL | Status: DC | PRN

## 2018-10-04 MED ORDER — CITALOPRAM HYDROBROMIDE 20 MG PO TABS: 40.0000 mg | ORAL_TABLET | Freq: Every day | ORAL | Status: DC

## 2018-10-04 MED ORDER — SENNOSIDES-DOCUSATE SODIUM 8.6-50 MG PO TABS: 1.0000 | ORAL_TABLET | Freq: Every evening | ORAL | Status: DC | PRN

## 2018-10-04 MED ORDER — INSULIN ASPART 100 UNIT/ML ~~LOC~~ SOLN
0.0000 [IU] | Freq: Three times a day (TID) | SUBCUTANEOUS | Status: DC
  Administered 2018-10-05: 5 [IU] via SUBCUTANEOUS
  Filled 2018-10-04: qty 1

## 2018-10-04 MED ORDER — ASPIRIN EC 81 MG PO TBEC
81.0000 mg | DELAYED_RELEASE_TABLET | Freq: Every day | ORAL | Status: DC
  Administered 2018-10-05: 81 mg via ORAL
  Filled 2018-10-04: qty 1

## 2018-10-04 MED ORDER — ENOXAPARIN SODIUM 30 MG/0.3ML ~~LOC~~ SOLN
30.0000 mg | SUBCUTANEOUS | Status: DC
  Administered 2018-10-05: 30 mg via SUBCUTANEOUS
  Filled 2018-10-04: qty 0.3

## 2018-10-04 MED ORDER — SODIUM CHLORIDE 0.9 % IV BOLUS
500.0000 mL | Freq: Once | INTRAVENOUS | Status: AC
  Administered 2018-10-04: 500 mL via INTRAVENOUS

## 2018-10-04 MED ORDER — ASPIRIN 81 MG PO CHEW
324.0000 mg | CHEWABLE_TABLET | Freq: Once | ORAL | Status: AC
  Administered 2018-10-04: 324 mg via ORAL
  Filled 2018-10-04: qty 4

## 2018-10-04 MED ORDER — ROPINIROLE HCL 1 MG PO TABS: 5.0000 mg | ORAL_TABLET | Freq: Every day | ORAL | Status: DC

## 2018-10-04 MED ORDER — VITAMIN D3 25 MCG (1000 UNIT) PO TABS
2000.0000 [IU] | ORAL_TABLET | Freq: Every day | ORAL | Status: DC
  Filled 2018-10-04 (×2): qty 2

## 2018-10-04 MED ORDER — ONDANSETRON HCL 4 MG/2ML IJ SOLN: 4.0000 mg | Freq: Four times a day (QID) | INTRAMUSCULAR | Status: DC | PRN

## 2018-10-04 MED ORDER — PANTOPRAZOLE SODIUM 40 MG PO TBEC
40.0000 mg | DELAYED_RELEASE_TABLET | Freq: Every day | ORAL | Status: DC
  Administered 2018-10-05: 40 mg via ORAL
  Filled 2018-10-04: qty 1

## 2018-10-04 MED ORDER — ROSUVASTATIN CALCIUM 10 MG PO TABS: 20.0000 mg | ORAL_TABLET | Freq: Every day | ORAL | Status: DC

## 2018-10-04 MED ORDER — LEVOTHYROXINE SODIUM 112 MCG PO TABS
112.0000 ug | ORAL_TABLET | Freq: Every day | ORAL | Status: DC
  Administered 2018-10-05: 112 ug via ORAL
  Filled 2018-10-04: qty 1

## 2018-10-04 MED ORDER — SODIUM CHLORIDE 0.9% FLUSH
3.0000 mL | Freq: Two times a day (BID) | INTRAVENOUS | Status: DC
  Administered 2018-10-05: 3 mL via INTRAVENOUS

## 2018-10-04 MED ORDER — DARIFENACIN HYDROBROMIDE ER 15 MG PO TB24
15.0000 mg | ORAL_TABLET | Freq: Every day | ORAL | Status: DC
  Filled 2018-10-04: qty 1

## 2018-10-04 NOTE — ED Notes (Signed)
ED Provider at bedside. 

## 2018-10-04 NOTE — ED Provider Notes (Signed)
[facility] Emergency Department Provider Note  ____________________________________________   I have reviewed the triage vital signs and the nursing notes. Where available I have reviewed prior notes and, if possible and indicated, outside hospital notes.    HISTORY  Chief Complaint Dizziness and Weakness    HPI VAYLA WILHELMI is a 85 y.o. female  who presents today complaining of feeling weak.  She just feels generally weak.  The rest of the review of systems is negative no chest pain or shortness of breath over her baseline, no leg swelling no dysuria no urinary frequency no hematuria no melena no bright red blood per rectum no abdominal pain no chest pain, she has been feeling generally weak for some time and her blood pressures have been running low.  As a result, they did change her blood pressure medications about a week ago.  She was on 20 of lisinopril now she is down to 10 she states.  However, she still feels lightheaded and weak.  She has been checking her blood pressure and it is been low at home.  Today, she felt very lightheaded had a low blood pressure and came in.  No other complaints.  No other alleviating or aggravating symptoms.  She does not know when she takes her blood pressure medications because it comes in a package from the pharmacy however, she does note that in the morning she is the most orthostatic but sometimes during the day gets worse     Past Medical History:  Diagnosis Date  . Acquired hypothyroidism 01/06/2014  . Anxiety   . Arthritis   . B12 deficiency 02/15/2014  . Benign essential hypertension 01/06/2014  . Breast cancer [facility]) 1982   Right breast cancer - chemotherapy  . CAD (coronary artery disease), native coronary artery 01/06/2014  . Carotid artery calcification   . Chronic airway obstruction, not elsewhere classified 01/06/2014  . Depression   . Diabetes mellitus without complication ([place])   . History of kidney stones  2019   left ureteral stone  . Incontinence of urine   . Myocardial infarction ([place]) 1982   small infarct  . Pacemaker   . Presence of permanent cardiac pacemaker 07/2016  . Pure hypercholesterolemia 01/06/2014  . Renal insufficiency   . Skin cancer   . Sleep apnea    uses cpap    Patient Active Problem List   Diagnosis Date Noted  . Non-STEMI (non-ST elevated myocardial infarction) ([place]) 08/18/2018  . Abnormal UGI series 03/31/2018  . Lymphedema 03/01/2018  . Acute respiratory failure with hypoxia ([place]) 09/16/2017  . Sepsis ([place]) 08/17/2017  . UTI (urinary tract infection) 08/17/2017  . Acute respiratory distress 08/17/2017  . 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 Sick sinus syndrome ([place]) 07/03/2016  . New onset atrial flutter ([place]) 06/14/2016  . Bradycardia, sinus 06/07/2016  . Syncope 06/06/2016  . Chest pain 04/21/2016  . Elevated troponin 04/21/2016  . Labile hypertension 04/21/2016  . Ischemic chest pain ([place]) 04/21/2016  . Long-term insulin use ([place]) 05/24/2014  . Mild vitamin D deficiency 05/20/2014  . B12 deficiency 02/15/2014  . Acquired hypothyroidism 01/06/2014  . Chronic airway obstruction, not elsewhere classified 01/06/2014  . CAD (coronary artery disease), native coronary artery 01/06/2014  . Benign essential hypertension 01/06/2014  . Headache 01/06/2014  . Hypersomnia with sleep apnea 01/06/2014  . Pure hypercholesterolemia 01/06/2014  . Type II diabetes mellitus with manifestations ([place]) 01/06/2014    Past Surgical History:  Procedure Laterality  Date  . APPENDECTOMY    . AUGMENTATION MAMMAPLASTY Bilateral 1982/redo in 2014   prior mastectomy  . CARDIAC CATHETERIZATION N/A 04/23/2016   Procedure: Left Heart Cath and Coronary Angiography;  Surgeon: Corey Skains, MD;  Location: [place] CV LAB;  Service: Cardiovascular;  Laterality: N/A;  . CARDIAC CATHETERIZATION Left 04/23/2016   Procedure: Coronary Stent Intervention;   Surgeon: Yolonda Kida, MD;  Location: [place] CV LAB;  Service: Cardiovascular;  Laterality: Left;  . CAROTID ARTERY ANGIOPLASTY Left 2010   had stent inserted and removed d/t infection. artery from leg inserted in left carotid  . CAROTID ENDARTERECTOMY Left 2010  . CHOLECYSTECTOMY    . CORONARY ANGIOPLASTY WITH STENT PLACEMENT  september 9th 2017  . CYSTOSCOPY W/ URETERAL STENT PLACEMENT Left 08/17/2017   Procedure: CYSTOSCOPY WITH RETROGRADE PYELOGRAM/URETERAL STENT PLACEMENT;  Surgeon: Festus Aloe, MD;  Location: [facility] ORS;  Service: Urology;  Laterality: Left;  . CYSTOSCOPY W/ URETERAL STENT PLACEMENT Left 09/16/2017   Procedure: CYSTOSCOPY WITH STENT REPLACEMENT;  Surgeon: Abbie Sons, MD;  Location: [facility] ORS;  Service: Urology;  Laterality: Left;  . CYSTOSCOPY/RETROGRADE/URETEROSCOPY/STONE EXTRACTION WITH BASKET Left 09/16/2017   Procedure: CYSTOSCOPY/RETROGRADE/URETEROSCOPY/STONE EXTRACTION WITH BASKET;  Surgeon: Abbie Sons, MD;  Location: [facility] ORS;  Service: Urology;  Laterality: Left;  . EYE SURGERY Bilateral    cataract extraction  . KYPHOPLASTY N/A 02/03/2018   Procedure: [id number];  Surgeon: Hessie Knows, MD;  Location: [facility] ORS;  Service: Orthopedics;  Laterality: N/A;  . MASTECTOMY Right 1982  . PACEMAKER INSERTION Left 07/03/2016   Procedure: INSERTION PACEMAKER;  Surgeon: Isaias Cowman, MD;  Location: [facility] ORS;  Service: Cardiovascular;  Laterality: Left;  . SKIN CANCER EXCISION    . TUBAL LIGATION      Prior to Admission medications   Medication Sig Start Date End Date Taking? Authorizing Provider  aspirin EC 81 MG tablet Take 81 mg by mouth daily.    [provider]  Cholecalciferol (VITAMIN D3) 2000 units TABS Take 2,000 Units by mouth daily.    [provider]  citalopram (CELEXA) 40 MG tablet TAKE ONE TABLET BY MOUTH EVERY NIGHT AT BEDTIME. 07/07/14   [provider]  furosemide (LASIX) 40 MG tablet TAKE ONE  TABLET TWICE DAILY 02/16/18   [provider]  gabapentin (NEURONTIN) 300 MG capsule Take by mouth. 02/16/18 02/16/19  [provider]  HUMALOG KWIKPEN 100 UNIT/ML KiwkPen Inject 12 Units into the skin 3 (three) times daily. *Use as directed per sliding scale* 03/03/15   [provider]  insulin glargine (LANTUS) 100 UNIT/ML injection Inject 0.5 mLs (50 Units total) into the skin at bedtime. 08/21/18   Bettey Costa, MD  levothyroxine (SYNTHROID, LEVOTHROID) 112 MCG tablet Take 1 tablet (112 mcg total) by mouth daily before breakfast. 06/17/16   Bettey Costa, MD  lisinopril (PRINIVIL,ZESTRIL) 20 MG tablet Take 1 tablet (20 mg total) by mouth daily. 08/22/18   Bettey Costa, MD  LORazepam (ATIVAN) 0.5 MG tablet Take 1 tablet (0.5 mg total) by mouth at bedtime. 08/21/18   Bettey Costa, MD  metFORMIN (GLUCOPHAGE-XR) 500 MG 24 hr tablet Take 2 tablets (1,000 mg total) by mouth 2 (two) times daily. Please start this on 8/24 08/21/18   Bettey Costa, MD  metoprolol succinate (TOPROL-XL) 50 MG 24 hr tablet Take 1 tablet (50 mg total) by mouth daily. Take with or immediately following a meal. 08/21/18   Bettey Costa, MD  Multiple Vitamin (MULTIVITAMIN WITH MINERALS) TABS tablet Take  1 tablet by mouth daily. 09/24/17   Max Sane, MD  nitroGLYCERIN (NITROSTAT) 0.4 MG SL tablet Place 1 tablet (0.4 mg total) under the tongue every 5 (five) minutes as needed for chest pain. 04/24/16   Bettey Costa, MD  omeprazole (PRILOSEC) 40 MG capsule Take 40 mg by mouth daily.  01/16/18 01/16/19  [provider]  ropinirole (REQUIP) 5 MG tablet Take 5 mg by mouth at bedtime.  03/29/15   [provider]  rosuvastatin (CRESTOR) 10 MG tablet Take 2 tablets (20 mg total) by mouth at bedtime. 08/19/18   Demetrios Loll, MD  solifenacin (VESICARE) 10 MG tablet Take 10 mg by mouth daily.  10/27/17 10/27/18  [provider]  sucralfate (CARAFATE) 1 GM/10ML suspension Take by mouth. 01/16/18 01/16/19   [provider]  tiotropium (SPIRIVA) 18 MCG inhalation capsule Place 1 capsule (18 mcg total) into inhaler and inhale every morning. 09/23/17   Max Sane, MD  venlafaxine XR (EFFEXOR-XR) 75 MG 24 hr capsule Take 75 mg by mouth daily. 03/30/18 03/30/19  [provider]    Allergies Patient has no known allergies.  Family History  Problem Relation Age of Onset  . Prostate cancer Neg Hx   . Kidney cancer Neg Hx   . Breast cancer Neg Hx     Social History Social History   Tobacco Use  . Smoking status: Former Smoker    Packs/day: 0.50    Types: Cigarettes    Last attempt to quit: 09/02/1996    Years since quitting: 22.1  . Smokeless tobacco: Never Used  Substance Use Topics  . Alcohol use: Not Currently    Alcohol/week: 1.0 standard drinks    Types: 1 Glasses of wine per week  . Drug use: No    Review of Systems Constitutional: No fever/chills Eyes: No visual changes. ENT: No sore throat. No stiff neck no neck pain Cardiovascular: Denies chest pain. Respiratory: Denies shortness of breath. Gastrointestinal:   no vomiting.  No diarrhea.  No constipation. Genitourinary: Negative for dysuria. Musculoskeletal: Negative lower extremity swelling Skin: Negative for rash. Neurological: Negative for severe headaches, focal weakness or numbness.   ____________________________________________   PHYSICAL EXAM:  VITAL SIGNS: ED Triage Vitals  Enc Vitals Group     BP 10/04/18 2052 (!) 116/22     Pulse Rate 10/04/18 2052 61     Resp 10/04/18 2052 18     Temp 10/04/18 2052 97.8 F (36.6 C)     Temp Source 10/04/18 2052 Oral     SpO2 10/04/18 2052 95 %     Weight 10/04/18 2051 167 lb (75.8 kg)     Height 10/04/18 2051 5\' 2"  (1.575 m)     Head Circumference --      Peak Flow --      Pain Score 10/04/18 2051 0     Pain Loc --      Pain Edu? --      Excl. in [place]? --     Constitutional: Alert and oriented. Well appearing and in no acute distress. Eyes:  Conjunctivae are normal Head: Atraumatic HEENT: No congestion/rhinnorhea. Mucous membranes are moist.  Oropharynx non-erythematous Neck:   Nontender with no meningismus, no masses, no stridor Cardiovascular: Normal rate, regular rhythm. Grossly normal heart sounds.  Good peripheral circulation. Respiratory: Normal respiratory effort.  No retractions. Lungs CTAB. Abdominal: Soft and nontender. No distention. No guarding no rebound Back:  There is no focal tenderness or step off.  there is no  midline tenderness there are no lesions noted. there is no CVA tenderness Musculoskeletal: No lower extremity tenderness, no upper extremity tenderness. No joint effusions, no DVT signs strong distal pulses no edema Neurologic:  Normal speech and language. No gross focal neurologic deficits are appreciated.  Skin:  Skin is warm, dry and intact. No rash noted. Psychiatric: Mood and affect are normal. Speech and behavior are normal.  ____________________________________________   LABS (all labs ordered are listed, but only abnormal results are displayed)  Labs Reviewed  CBC - Abnormal; Notable for the following components:      Result Value   WBC 11.2 (*)    Hemoglobin 11.7 (*)    All other components within normal limits  COMPREHENSIVE METABOLIC PANEL - Abnormal; Notable for the following components:   Glucose, Bld 216 (*)    BUN 37 (*)    Creatinine, Ser 1.48 (*)    Albumin 3.4 (*)    GFR calc non Af Amer 32 (*)    GFR calc Af Amer 37 (*)    All other components within normal limits  TROPONIN I - Abnormal; Notable for the following components:   Troponin I 0.70 (*)    All other components within normal limits  URINALYSIS, COMPLETE (UACMP) WITH MICROSCOPIC    Pertinent labs  results that were available during my care of the patient were reviewed by me and considered in my medical decision making (see chart for details). ____________________________________________  EKG  I personally  interpreted any EKGs ordered by me or triage Paced rhythm, atrially, right bundle branch block appreciated no acute ischemic changes ____________________________________________  RADIOLOGY  Pertinent labs & imaging results that were available during my care of the patient were reviewed by me and considered in my medical decision making (see chart for details). If possible, patient and/or family made aware of any abnormal findings.  Dg Chest Port 1 View  Result Date: 10/04/2018 CLINICAL DATA:  Lightheaded and weak today. Shortness of breath. EXAM: PORTABLE CHEST 1 VIEW COMPARISON:  08/18/2018 FINDINGS: Cardiac pacemaker. Mild cardiac enlargement. No vascular congestion, edema, or consolidation. Suggestion of mild fibrosis in the lung apices. Mediastinal contours appear intact. Calcification of the aorta. IMPRESSION: Cardiac enlargement. No evidence of active pulmonary disease. Electronically Signed   By: Lucienne Capers M.D.   On: 10/04/2018 22:01   ____________________________________________    PROCEDURES  Procedure(s) performed: None  Procedures  Critical Care performed: None  ____________________________________________   INITIAL IMPRESSION / ASSESSMENT AND PLAN / ED COURSE  Pertinent labs & imaging results that were available during my care of the patient were reviewed by me and considered in my medical decision making (see chart for details).  Patient here with weakness and lightheadedness, vaguely described, exam is reassuring, blood pressure is rooming on its own, we are giving her IV fluid here, I do suspect this is largely because of overmedication, however, her creatinine is up and her troponin is up, her troponin opponent appears to be chronically elevated, but given elevated creatinine at the same time we feel the patient with her labile blood pressures and advanced age would better off being admitted for observation.     ____________________________________________   FINAL CLINICAL IMPRESSION(S) / ED DIAGNOSES  Final diagnoses:  Weakness      This chart was dictated using voice recognition software.  Despite best efforts to proofread,  errors can occur which can change meaning.      Schuyler Amor, MD 10/04/18 2228

## 2018-10-04 NOTE — ED Notes (Signed)
Called patient's husband per patient request to let him know patient being transferred to room 256.

## 2018-10-04 NOTE — ED Triage Notes (Signed)
Patient states that she has been feeling weak and dizzy all day. Patient reports that she checked her blood pressure at home and that it was low. Patient is unsure of what her blood pressure was at home.

## 2018-10-04 NOTE — ED Notes (Signed)
Attempted to call report to floor. Primary RN unavailable.

## 2018-10-04 NOTE — ED Notes (Signed)
ED TO INPATIENT HANDOFF REPORT  Name/Age/Gender Cassandra [name] 85 y.o. female  Code Status Code Status History    Date Active Date Inactive Code Status Order ID Comments User Context   08/18/2018 1539 08/21/2018 2246 Full Code [id number]  Saundra Shelling, MD Inpatient   02/03/2018 1644 02/03/2018 2036 Full Code [id number]  Hessie Knows, MD Inpatient   11/07/2017 1658 11/08/2017 1800 Full Code [id number]  Nicholes Mango, MD Inpatient   09/16/2017 1423 09/23/2017 1525 Full Code [id number]  Fritzi Mandes, MD Inpatient   08/17/2017 1958 08/20/2017 1447 Full Code [id number]  Idelle Crouch, MD Inpatient   07/03/2016 1637 07/04/2016 1255 Full Code [id number]  Isaias Cowman, MD Inpatient   06/14/2016 2344 06/16/2016 1358 Full Code [id number]  Harvie Bridge, DO Inpatient   06/07/2016 0128 06/08/2016 1512 Full Code [id number]  Lance Coon, MD Inpatient   04/23/2016 1404 04/24/2016 1435 Full Code [id number]  Yolonda Kida, MD Inpatient   04/21/2016 2018 04/22/2016 0859 Full Code [id number]  Idelle Crouch, MD Inpatient      Home/SNF/Other Home  Chief Complaint Hypotension  Level of Care/Admitting Diagnosis ED Disposition    ED Disposition Condition [place]: [place] [100120]  Level of Care: Telemetry [5]  Diagnosis: Near syncope [id number]  Admitting Physician: Arta Silence [[id number]]  Attending Physician: Arta Silence [[id number]]  PT Class (Do Not Modify): Observation [104]  PT Acc Code (Do Not Modify): Observation [10022]       Medical History Past Medical History:  Diagnosis Date  . Acquired hypothyroidism 01/06/2014  . Anxiety   . Arthritis   . B12 deficiency 02/15/2014  . Benign essential hypertension 01/06/2014  . Breast cancer [facility]) 1982   Right breast cancer - chemotherapy  . CAD (coronary artery disease), native coronary artery 01/06/2014  . Carotid artery calcification   . Chronic airway obstruction, not elsewhere  classified 01/06/2014  . Depression   . Diabetes mellitus without complication ([place])   . History of kidney stones 2019   left ureteral stone  . Incontinence of urine   . Myocardial infarction ([place]) 1982   small infarct  . Pacemaker   . Presence of permanent cardiac pacemaker 07/2016  . Pure hypercholesterolemia 01/06/2014  . Renal insufficiency   . Skin cancer   . Sleep apnea    uses cpap    Allergies No Known Allergies  IV Location/Drains/Wounds Patient Lines/Drains/Airways Status   Active Line/Drains/Airways    Name:   Placement date:   Placement time:   Site:   Days:   Peripheral IV 10/04/18 Left Antecubital   10/04/18    2125    Antecubital   less than 1          Labs/Imaging Results for orders placed or performed during the hospital encounter of 10/04/18 (from the past 48 hour(s))  CBC     Status: Abnormal   Collection Time: 10/04/18  9:25 PM  Result Value Ref Range   WBC 11.2 (H) 4.0 - 10.5 K/uL   RBC 4.30 3.87 - 5.11 MIL/uL   Hemoglobin 11.7 (L) 12.0 - 15.0 g/dL   HCT 36.6 36.0 - 46.0 %   MCV 85.1 80.0 - 100.0 fL   MCH 27.2 26.0 - 34.0 pg   MCHC 32.0 30.0 - 36.0 g/dL   RDW 15.2 11.5 - 15.5 %   Platelets 266 150 - 400 K/uL   nRBC 0.0 0.0 - 0.2 %    Comment: Performed at [facility], [address] [place], [place] [address]  Comprehensive metabolic panel     Status: Abnormal   Collection Time: 10/04/18  9:25 PM  Result Value Ref Range   Sodium 137 135 - 145 mmol/L   Potassium 3.9 3.5 - 5.1 mmol/L   Chloride 100 98 - 111 mmol/L   CO2 24 22 - 32 mmol/L   Glucose, Bld 216 (H) 70 - 99 mg/dL   BUN 37 (H) 8 - 23 mg/dL   Creatinine, Ser 1.48 (H) 0.44 - 1.00 mg/dL   Calcium 9.0 8.9 - 10.3 mg/dL   Total Protein 6.5 6.5 - 8.1 g/dL   Albumin 3.4 (L) 3.5 - 5.0 g/dL   AST 17 15 - 41 U/L   ALT 11 0 - 44 U/L   Alkaline Phosphatase 50 38 - 126 U/L   Total Bilirubin 0.5 0.3 - 1.2 mg/dL   GFR calc non Af Amer 32 (L) >60 mL/min   GFR calc Af Amer 37 (L) >60  mL/min   Anion gap 13 5 - 15    Comment: Performed at [facility], [address] [place], [place] [address]  Troponin I - ONCE - STAT     Status: Abnormal   Collection Time: 10/04/18  9:25 PM  Result Value Ref Range   Troponin I 0.70 (HH) <0.03 ng/mL    Comment: CRITICAL RESULT CALLED TO, READ BACK BY AND VERIFIED WITH Othelia Riederer ON 10/04/18 AT 2216 BY JAG Performed at [facility], [address] [place], [place] [address]    Dg Chest Port 1 View  Result Date: 10/04/2018 CLINICAL DATA:  Lightheaded and weak today. Shortness of breath. EXAM: PORTABLE CHEST 1 VIEW COMPARISON:  08/18/2018 FINDINGS: Cardiac pacemaker. Mild cardiac enlargement. No vascular congestion, edema, or consolidation. Suggestion of mild fibrosis in the lung apices. Mediastinal contours appear intact. Calcification of the aorta. IMPRESSION: Cardiac enlargement. No evidence of active pulmonary disease. Electronically Signed   By: Lucienne Capers M.D.   On: 10/04/2018 22:01    Pending Labs Unresulted Labs (From admission, onward)    Start     Ordered   10/05/18 0500  Troponin I - Tomorrow AM 0500  Tomorrow morning,   STAT     10/04/18 2242   10/05/18 0100  Troponin I - Once  Once,   STAT     10/04/18 2242   10/04/18 2244  Magnesium  Add-on,   AD    Question:  Specimen collection method  Answer:  Unit=Unit collect   10/04/18 2243   10/04/18 2244  Phosphorus  Add-on,   AD    Question:  Specimen collection method  Answer:  Unit=Unit collect   10/04/18 2243   10/04/18 2244  Prealbumin  Add-on,   AD    Question:  Specimen collection method  Answer:  Unit=Unit collect   10/04/18 2243   10/04/18 2243  Urinalysis, Routine w reflex microscopic  Once,   STAT     10/04/18 2243   10/04/18 2243  Na and K (sodium & potassium), rand urine  Once,   STAT     10/04/18 2243   10/04/18 2243  Creatinine, urine, random  Once,   STAT     10/04/18 2243   10/04/18 2243  Urea nitrogen, urine  Once,    STAT     10/04/18 2243   10/04/18 2243  Protein, urine, random  Once,   STAT     10/04/18 2243   10/04/18  2126  Urinalysis, Complete w Microscopic  ONCE - STAT,   STAT     10/04/18 2125          Vitals/Pain Today's Vitals   10/04/18 2140 10/04/18 2150 10/04/18 2200 10/04/18 2229  BP: (!) 107/55 (!) 123/48 112/75 (!) 102/49  Pulse: (!) 58 62 61 (!) 59  Resp: 18 13 16 13   Temp:      TempSrc:      SpO2: 93% 92% 95% 94%  Weight:      Height:      PainSc:        Isolation Precautions No active isolations  Medications Medications  sodium chloride 0.9 % bolus 500 mL (500 mLs Intravenous New Bag/Given 10/04/18 2205)  aspirin chewable tablet 324 mg (324 mg Oral Given 10/04/18 2230)    Mobility walks

## 2018-10-04 NOTE — H&P (Signed)
[facility] NAME: Cassandra [name]    MR#:  [id number]  DATE OF BIRTH:  08/13/1933  DATE OF ADMISSION:  10/04/2018  PRIMARY CARE PHYSICIAN: Idelle Crouch, MD   REQUESTING/REFERRING PHYSICIAN: Schuyler Amor, MD  CHIEF COMPLAINT:   Chief Complaint  Patient presents with  . Dizziness  . Weakness    HISTORY OF PRESENT ILLNESS:  Cassandra [name]  is a 85 y.o. female with a known history of T2IDDM, HTN, HLD, CAD/MI (s/p stent), hypothyroidism, bradycardia/SSS (s/p PPM) p/w fatigue/malaise/generalized weakness, lightheadedness/near-syncope. Pt states she has had mild intermittent lightheadedness x~2wks now, but states that it has been severe x1d. She states that she woke up on Sunday (10/04/2018) morning, sat up in bed and felt lightheaded. She states she continued to sit on the edge of the bed for some time. Her symptoms were persistent, and did not improve. She used her walker to get herself up, and walked to the living room, but states she was "worn out", (+) fatigue/malaise/generalized weakness, and sat down for ~1hr. She states she continued to feel poorly throughout the day, and endorses dehydration, poor PO intake and loss of appetite. She endorses intermittent lightheadedness that was random at times, but was also made worse w/ position change and exertion. She states she went out for dinner, and then checked her BP when she was back home (@~2000PM), and states it was "low". Her husband drove her to the ED. SBP 90s-100s at the time of my assessment. Comfortable, does not appear septic/toxic. Saw Dr. Ubaldo Glassing 09/28/2018, note states, "Her pacemaker is functioning normally." Lisinopril dose was reduced (I believe from 20mg  to 10mg ) at that visit as well. Last Echo 08/19/2018, EF 55-65%.  PAST MEDICAL HISTORY:   Past Medical History:  Diagnosis Date  . Acquired hypothyroidism 01/06/2014  . Anxiety   . Arthritis   . B12 deficiency  02/15/2014  . Benign essential hypertension 01/06/2014  . Breast cancer [facility]) 1982   Right breast cancer - chemotherapy  . CAD (coronary artery disease), native coronary artery 01/06/2014  . Carotid artery calcification   . Chronic airway obstruction, not elsewhere classified 01/06/2014  . Depression   . Diabetes mellitus without complication ([place])   . History of kidney stones 2019   left ureteral stone  . Incontinence of urine   . Myocardial infarction ([place]) 1982   small infarct  . Pacemaker   . Presence of permanent cardiac pacemaker 07/2016  . Pure hypercholesterolemia 01/06/2014  . Renal insufficiency   . Skin cancer   . Sleep apnea    uses cpap    PAST SURGICAL HISTORY:   Past Surgical History:  Procedure Laterality Date  . APPENDECTOMY    . AUGMENTATION MAMMAPLASTY Bilateral 1982/redo in 2014   prior mastectomy  . CARDIAC CATHETERIZATION N/A 04/23/2016   Procedure: Left Heart Cath and Coronary Angiography;  Surgeon: Corey Skains, MD;  Location: [facility];  Service: Cardiovascular;  Laterality: N/A;  . CARDIAC CATHETERIZATION Left 04/23/2016   Procedure: Coronary Stent Intervention;  Surgeon: Yolonda Kida, MD;  Location: [facility];  Service: Cardiovascular;  Laterality: Left;  . CAROTID ARTERY ANGIOPLASTY Left 2010   had stent inserted and removed d/t infection. artery from leg inserted in left carotid  . CAROTID ENDARTERECTOMY Left 2010  . CHOLECYSTECTOMY    . CORONARY ANGIOPLASTY WITH STENT PLACEMENT  september 9th 2017  . CYSTOSCOPY W/ URETERAL STENT PLACEMENT Left 08/17/2017  Procedure: CYSTOSCOPY WITH RETROGRADE PYELOGRAM/URETERAL STENT PLACEMENT;  Surgeon: Festus Aloe, MD;  Location: [facility];  Service: Urology;  Laterality: Left;  . CYSTOSCOPY W/ URETERAL STENT PLACEMENT Left 09/16/2017   Procedure: CYSTOSCOPY WITH STENT REPLACEMENT;  Surgeon: Abbie Sons, MD;  Location: [facility];  Service: Urology;  Laterality: Left;  .  CYSTOSCOPY/RETROGRADE/URETEROSCOPY/STONE EXTRACTION WITH BASKET Left 09/16/2017   Procedure: CYSTOSCOPY/RETROGRADE/URETEROSCOPY/STONE EXTRACTION WITH BASKET;  Surgeon: Abbie Sons, MD;  Location: [facility];  Service: Urology;  Laterality: Left;  . EYE SURGERY Bilateral    cataract extraction  . KYPHOPLASTY N/A 02/03/2018   Procedure: [id number];  Surgeon: Hessie Knows, MD;  Location: [facility];  Service: Orthopedics;  Laterality: N/A;  . MASTECTOMY Right 1982  . PACEMAKER INSERTION Left 07/03/2016   Procedure: INSERTION PACEMAKER;  Surgeon: Isaias Cowman, MD;  Location: [facility];  Service: Cardiovascular;  Laterality: Left;  . SKIN CANCER EXCISION    . TUBAL LIGATION      SOCIAL HISTORY:   Social History   Tobacco Use  . Smoking status: Former Smoker    Packs/day: 0.50    Types: Cigarettes    Last attempt to quit: 09/02/1996    Years since quitting: 22.1  . Smokeless tobacco: Never Used  Substance Use Topics  . Alcohol use: Not Currently    Alcohol/week: 1.0 standard drinks    Types: 1 Glasses of wine per week    FAMILY HISTORY:   Family History  Problem Relation Age of Onset  . Prostate cancer Neg Hx   . Kidney cancer Neg Hx   . Breast cancer Neg Hx     DRUG ALLERGIES:  No Known Allergies  REVIEW OF SYSTEMS:   Review of Systems  Constitutional: Positive for malaise/fatigue. Negative for chills, diaphoresis, fever and weight loss.  HENT: Negative for congestion, ear pain, hearing loss, nosebleeds, sinus pain, sore throat and tinnitus.   Eyes: Negative for blurred vision, double vision and photophobia.  Respiratory: Negative for cough, hemoptysis, sputum production, shortness of breath and wheezing.   Cardiovascular: Negative for chest pain, palpitations, orthopnea, claudication, leg swelling and PND.  Gastrointestinal: Negative for abdominal pain, blood in stool, constipation, diarrhea, heartburn, melena, nausea and vomiting.  Genitourinary: Negative for  dysuria, frequency, hematuria and urgency.  Musculoskeletal: Negative for back pain, falls, joint pain, myalgias and neck pain.  Skin: Negative for itching and rash.  Neurological: Positive for dizziness and weakness. Negative for tingling, tremors, sensory change, speech change, focal weakness, seizures, loss of consciousness and headaches.  Psychiatric/Behavioral: Negative for depression and memory loss. The patient is not nervous/anxious and does not have insomnia.    MEDICATIONS AT HOME:   Prior to Admission medications   Medication Sig Start Date End Date Taking? Authorizing Provider  aspirin EC 81 MG tablet Take 81 mg by mouth daily.   Yes [provider]  Cholecalciferol (VITAMIN D3) 2000 units TABS Take 2,000 Units by mouth daily.   Yes [provider]  citalopram (CELEXA) 40 MG tablet TAKE ONE TABLET BY MOUTH EVERY NIGHT AT BEDTIME. 07/07/14   [provider]  furosemide (LASIX) 40 MG tablet TAKE ONE TABLET TWICE DAILY 02/16/18   [provider]  gabapentin (NEURONTIN) 300 MG capsule Take by mouth. 02/16/18 02/16/19  [provider]  HUMALOG KWIKPEN 100 UNIT/ML KiwkPen Inject 12 Units into the skin 3 (three) times daily. *Use as directed per sliding scale* 03/03/15   [provider]  insulin glargine (LANTUS) 100 UNIT/ML injection Inject 0.5 mLs (  50 Units total) into the skin at bedtime. 08/21/18   Bettey Costa, MD  levothyroxine (SYNTHROID, LEVOTHROID) 112 MCG tablet Take 1 tablet (112 mcg total) by mouth daily before breakfast. 06/17/16   Bettey Costa, MD  lisinopril (PRINIVIL,ZESTRIL) 20 MG tablet Take 1 tablet (20 mg total) by mouth daily. 08/22/18   Bettey Costa, MD  LORazepam (ATIVAN) 0.5 MG tablet Take 1 tablet (0.5 mg total) by mouth at bedtime. 08/21/18   Bettey Costa, MD  metFORMIN (GLUCOPHAGE-XR) 500 MG 24 hr tablet Take 2 tablets (1,000 mg total) by mouth 2 (two) times daily. Please start this on 8/24 08/21/18   Bettey Costa, MD    metoprolol succinate (TOPROL-XL) 50 MG 24 hr tablet Take 1 tablet (50 mg total) by mouth daily. Take with or immediately following a meal. 08/21/18   Bettey Costa, MD  Multiple Vitamin (MULTIVITAMIN WITH MINERALS) TABS tablet Take 1 tablet by mouth daily. 09/24/17   Max Sane, MD  nitroGLYCERIN (NITROSTAT) 0.4 MG SL tablet Place 1 tablet (0.4 mg total) under the tongue every 5 (five) minutes as needed for chest pain. 04/24/16   Bettey Costa, MD  omeprazole (PRILOSEC) 40 MG capsule Take 40 mg by mouth daily.  01/16/18 01/16/19  [provider]  ropinirole (REQUIP) 5 MG tablet Take 5 mg by mouth at bedtime.  03/29/15   [provider]  rosuvastatin (CRESTOR) 10 MG tablet Take 2 tablets (20 mg total) by mouth at bedtime. 08/19/18   Demetrios Loll, MD  solifenacin (VESICARE) 10 MG tablet Take 10 mg by mouth daily.  10/27/17 10/27/18  [provider]  sucralfate (CARAFATE) 1 GM/10ML suspension Take by mouth. 01/16/18 01/16/19  [provider]  tiotropium (SPIRIVA) 18 MCG inhalation capsule Place 1 capsule (18 mcg total) into inhaler and inhale every morning. 09/23/17   Max Sane, MD  venlafaxine XR (EFFEXOR-XR) 75 MG 24 hr capsule Take 75 mg by mouth daily. 03/30/18 03/30/19  [provider]      VITAL SIGNS:  Blood pressure (!) 102/49, pulse (!) 59, temperature 97.8 F (36.6 C), temperature source Oral, resp. rate 13, height 5\' 2"  (1.575 m), weight 75.8 kg, SpO2 94 %.  PHYSICAL EXAMINATION:  Physical Exam Constitutional:      General: She is awake. She is not in acute distress.    Appearance: Normal appearance. She is well-developed. She is obese. She is not ill-appearing, toxic-appearing or diaphoretic.  HENT:     Head: Atraumatic.     Mouth/Throat:     Pharynx: Oropharynx is clear.  Eyes:     General: Lids are normal. No scleral icterus.    Extraocular Movements: Extraocular movements intact.     Conjunctiva/sclera: Conjunctivae normal.  Neck:      Musculoskeletal: Neck supple.  Cardiovascular:     Rate and Rhythm: Normal rate and regular rhythm.     Heart sounds: Normal heart sounds. No murmur. No friction rub. No gallop.   Pulmonary:     Effort: No respiratory distress.     Breath sounds: Normal breath sounds. No stridor. No wheezing, rhonchi or rales.  Abdominal:     General: Bowel sounds are normal. There is no distension.     Palpations: Abdomen is soft.     Tenderness: There is no abdominal tenderness. There is no guarding or rebound.  Musculoskeletal: Normal range of motion.        General: No swelling or tenderness.     Right lower leg: No edema.  Left lower leg: No edema.  Lymphadenopathy:     Cervical: No cervical adenopathy.  Skin:    General: Skin is warm and dry.     Findings: No erythema or rash.  Neurological:     Mental Status: She is alert and oriented to person, place, and time. Mental status is at baseline.  Psychiatric:        Mood and Affect: Mood normal.        Behavior: Behavior normal. Behavior is cooperative.        Thought Content: Thought content normal.        Judgment: Judgment normal.    LABORATORY PANEL:   CBC Recent Labs  Lab 10/04/18 2125  WBC 11.2*  HGB 11.7*  HCT 36.6  PLT 266   ------------------------------------------------------------------------------------------------------------------  Chemistries  Recent Labs  Lab 10/04/18 2125  NA 137  K 3.9  CL 100  CO2 24  GLUCOSE 216*  BUN 37*  CREATININE 1.48*  CALCIUM 9.0  AST 17  ALT 11  ALKPHOS 50  BILITOT 0.5   ------------------------------------------------------------------------------------------------------------------  Cardiac Enzymes Recent Labs  Lab 10/04/18 2125  TROPONINI 0.70*   ------------------------------------------------------------------------------------------------------------------  RADIOLOGY:  Dg Chest Port 1 View  Result Date: 10/04/2018 CLINICAL DATA:  Lightheaded and weak today.  Shortness of breath. EXAM: PORTABLE CHEST 1 VIEW COMPARISON:  08/18/2018 FINDINGS: Cardiac pacemaker. Mild cardiac enlargement. No vascular congestion, edema, or consolidation. Suggestion of mild fibrosis in the lung apices. Mediastinal contours appear intact. Calcification of the aorta. IMPRESSION: Cardiac enlargement. No evidence of active pulmonary disease. Electronically Signed   By: Lucienne Capers M.D.   On: 10/04/2018 22:01   IMPRESSION AND PLAN:   A/P: 67F w/ PMHx T2IDDM, HTN, HLD, CAD/MI (s/p stent), hypothyroidism, bradycardia/SSS (s/p PPM) p/w fatigue/malaise/generalized weakness, lightheadedness/near-syncope. Dehydration, Cr elevation, prerenal AKI (superimposed on CKD III), hypotension. Hyperglycemia (w/ T2IDDM), hypoalbuminemia, Troponin elevation, mild leukocytosis, mild normocytic anemia. U/A (+) UTI. -Fatigue/malaise/generalized weakness, lightheadedness/near-syncope, dehydration, Cr elevation, prerenal AKI: Endorses dehydration. On diuretics. Cr 1.48 on admission (increased from 0.94 on 08/19/2018), likely prerenal AKI (2/2 dehydration, intravascular volume depletion) superimposed on CKD III (2/2 DM, HTN, aged kidney). BUN/Cr ratio 37/1.48 = 25. IVF, urine studies, renal U/S pending. Monitor BMP, avoid nephrotoxins. Hold diuretics, ACE-I. -Hypotension: Hold home antihypertensives. -Hyperglycemia, T2IDDM: SSI, long-acting insulin. -Hypoalbuminemia: Prealbumin. -Troponin elevation: Trop-I 0.70, 0.78. Appears to have a baseline Troponin leak (0.57-0.77 on 08/18/2018; 1.20-1.39 on 11/08/2017; 0.30-0.73 on 08/17/2018-08/18/2018; and so on, and so forth, dating back to 04/21/2016). Trend. Tele, continuous cardiac monitoring. c/w home cardiac meds. Saw Dr. Ubaldo Glassing 09/28/2018, note states, "Her pacemaker is functioning normally." -Normocytic anemia: Mild, Hgb stable. No evidence of active/acute blood loss at present time. -UTI, leukocytosis: WBC 11.2. SIRS (-). U/A (+). Ceftriaxone. UCx  pending. -c/w other home meds/formulary subs as tolerated. -FEN/GI: Cardiac diabetic diet. -DVT PPx: Lovenox. -Code status: Full code. -Disposition: Observation, < 2 midnights. Meets UR criteria for inpatient admission, but I do not expect the pt to remain hospitalized > 2 midnights based on her presentation/current condition.   All the records are reviewed and case discussed with ED provider. Management plans discussed with the patient, family and they are in agreement.  CODE STATUS: Full code.  TOTAL TIME TAKING CARE OF THIS PATIENT: 75 minutes.    Arta Silence M.D on 10/04/2018 at 11:23 PM  Between 7am to 6pm - Pager - [phone]  After 6pm go to [email or web address] - Proofreader  [employer]  [phone]  CC: Primary care physician; Idelle Crouch, MD   Note: This dictation was prepared with Dragon dictation along with smaller phrase technology. Any transcriptional errors that result from this process are unintentional.

## 2018-10-05 ENCOUNTER — Observation Stay: Payer: Medicare Other

## 2018-10-05 ENCOUNTER — Other Ambulatory Visit: Payer: Self-pay

## 2018-10-05 LAB — URINALYSIS, COMPLETE (UACMP) WITH MICROSCOPIC
Bacteria, UA: NONE SEEN
Bilirubin Urine: NEGATIVE
Glucose, UA: NEGATIVE mg/dL
HGB URINE DIPSTICK: NEGATIVE
Ketones, ur: NEGATIVE mg/dL
Nitrite: NEGATIVE
Protein, ur: NEGATIVE mg/dL
Specific Gravity, Urine: 1.017 (ref 1.005–1.030)
pH: 5 (ref 5.0–8.0)

## 2018-10-05 LAB — NA AND K (SODIUM & POTASSIUM), RAND UR
Potassium Urine: 36 mmol/L
Sodium, Ur: 50 mmol/L

## 2018-10-05 LAB — CREATININE, URINE, RANDOM: Creatinine, Urine: 146 mg/dL

## 2018-10-05 LAB — PROTEIN, URINE, RANDOM: Total Protein, Urine: 26 mg/dL

## 2018-10-05 LAB — PHOSPHORUS: Phosphorus: 3.9 mg/dL (ref 2.5–4.6)

## 2018-10-05 LAB — PREALBUMIN: Prealbumin: 21.4 mg/dL (ref 18–38)

## 2018-10-05 LAB — GLUCOSE, CAPILLARY
Glucose-Capillary: 133 mg/dL — ABNORMAL HIGH (ref 70–99)
Glucose-Capillary: 237 mg/dL — ABNORMAL HIGH (ref 70–99)

## 2018-10-05 LAB — MAGNESIUM: Magnesium: 1.6 mg/dL — ABNORMAL LOW (ref 1.7–2.4)

## 2018-10-05 LAB — TROPONIN I
Troponin I: 0.63 ng/mL (ref <0.03)
Troponin I: 0.78 ng/mL (ref <0.03)

## 2018-10-05 MED ORDER — ENSURE MAX PROTEIN PO LIQD
11.0000 [oz_av] | Freq: Every day | ORAL | Status: DC
  Filled 2018-10-05: qty 330

## 2018-10-05 MED ORDER — METOPROLOL SUCCINATE ER 25 MG PO TB24: 25.0000 mg | ORAL_TABLET | Freq: Every day | ORAL | 0 refills | Status: DC

## 2018-10-05 MED ORDER — ESCITALOPRAM OXALATE 10 MG PO TABS
20.0000 mg | ORAL_TABLET | Freq: Every day | ORAL | Status: DC
  Filled 2018-10-05: qty 2

## 2018-10-05 MED ORDER — MECLIZINE HCL 12.5 MG PO TABS
12.5000 mg | ORAL_TABLET | Freq: Three times a day (TID) | ORAL | Status: DC | PRN
  Filled 2018-10-05: qty 1

## 2018-10-05 MED ORDER — CARBIDOPA-LEVODOPA 25-100 MG PO TABS
1.0000 | ORAL_TABLET | Freq: Every day | ORAL | Status: DC
  Filled 2018-10-05: qty 1

## 2018-10-05 MED ORDER — CEFDINIR 300 MG PO CAPS: 300.0000 mg | ORAL_CAPSULE | Freq: Two times a day (BID) | ORAL | 0 refills | Status: DC

## 2018-10-05 MED ORDER — CEPHALEXIN 250 MG PO CAPS: 250.0000 mg | ORAL_CAPSULE | Freq: Two times a day (BID) | ORAL | Status: DC

## 2018-10-05 MED ORDER — SODIUM CHLORIDE 0.9 % IV SOLN
1.0000 g | INTRAVENOUS | Status: DC
  Administered 2018-10-05: 1 g via INTRAVENOUS
  Filled 2018-10-05: qty 1
  Filled 2018-10-05: qty 10

## 2018-10-05 NOTE — Consult Note (Signed)
Reason for Consult: Vertigo weakness Referring Physician: Georgie Chard primary Dr. Jerelyn Charles hospitalist Cardiologist Dr. Ma Hillock Cassandra [name] is an 85 y.o. female.  HPI: Patient is a 85 year old female with multiple medical problems including diabetes hypertension hyperlipidemia coronary disease with myocardial infarction status post PCI and stenting hypothyroidism bradycardia with sick sinus syndrome status post permanent pacemaker fatigue malaise generalized weakness lightheadedness near syncope presented to the emergency room with lightheadedness over the last 2 weeks.  Patient states that over the last day has been relatively severe.  Patient woke up on Sunday sat up out of bed and felt lightheaded did not have any discrete blackout spells or syncope denies any fever chills or sweats no chest pain no severe palpitations or tachycardia patient was seen by cardiologist not long ago and had blood pressure medication was reduced  Past Medical History:  Diagnosis Date  . Acquired hypothyroidism 01/06/2014  . Anxiety   . Arthritis   . B12 deficiency 02/15/2014  . Benign essential hypertension 01/06/2014  . Breast cancer [facility]) 1982   Right breast cancer - chemotherapy  . CAD (coronary artery disease), native coronary artery 01/06/2014  . Carotid artery calcification   . Chronic airway obstruction, not elsewhere classified 01/06/2014  . Depression   . Diabetes mellitus without complication ([place])   . History of kidney stones 2019   left ureteral stone  . Incontinence of urine   . Myocardial infarction ([place]) 1982   small infarct  . Pacemaker   . Presence of permanent cardiac pacemaker 07/2016  . Pure hypercholesterolemia 01/06/2014  . Renal insufficiency   . Skin cancer   . Sleep apnea    uses cpap    Past Surgical History:  Procedure Laterality Date  . APPENDECTOMY    . AUGMENTATION MAMMAPLASTY Bilateral 1982/redo in 2014   prior mastectomy  . CARDIAC CATHETERIZATION N/A 04/23/2016    Procedure: Left Heart Cath and Coronary Angiography;  Surgeon: Corey Skains, MD;  Location: [facility];  Service: Cardiovascular;  Laterality: N/A;  . CARDIAC CATHETERIZATION Left 04/23/2016   Procedure: Coronary Stent Intervention;  Surgeon: Yolonda Kida, MD;  Location: [place] [place] [facility];  Service: Cardiovascular;  Laterality: Left;  . CAROTID ARTERY ANGIOPLASTY Left 2010   had stent inserted and removed d/t infection. artery from leg inserted in left carotid  . CAROTID ENDARTERECTOMY Left 2010  . CHOLECYSTECTOMY    . CORONARY ANGIOPLASTY WITH STENT PLACEMENT  september 9th 2017  . CYSTOSCOPY W/ URETERAL STENT PLACEMENT Left 08/17/2017   Procedure: CYSTOSCOPY WITH RETROGRADE PYELOGRAM/URETERAL STENT PLACEMENT;  Surgeon: Festus Aloe, MD;  Location: [facility];  Service: Urology;  Laterality: Left;  . CYSTOSCOPY W/ URETERAL STENT PLACEMENT Left 09/16/2017   Procedure: CYSTOSCOPY WITH STENT REPLACEMENT;  Surgeon: Abbie Sons, MD;  Location: [facility];  Service: Urology;  Laterality: Left;  . CYSTOSCOPY/RETROGRADE/URETEROSCOPY/STONE EXTRACTION WITH BASKET Left 09/16/2017   Procedure: CYSTOSCOPY/RETROGRADE/URETEROSCOPY/STONE EXTRACTION WITH BASKET;  Surgeon: Abbie Sons, MD;  Location: [facility];  Service: Urology;  Laterality: Left;  . EYE SURGERY Bilateral    cataract extraction  . KYPHOPLASTY N/A 02/03/2018   Procedure: [id number];  Surgeon: Hessie Knows, MD;  Location: [facility];  Service: Orthopedics;  Laterality: N/A;  . MASTECTOMY Right 1982  . PACEMAKER INSERTION Left 07/03/2016   Procedure: INSERTION PACEMAKER;  Surgeon: Isaias Cowman, MD;  Location: [facility];  Service: Cardiovascular;  Laterality: Left;  . SKIN CANCER EXCISION    . TUBAL LIGATION  Family History  Problem Relation Age of Onset  . Prostate cancer Neg Hx   . Kidney cancer Neg Hx   . Breast cancer Neg Hx     Social History:  reports that she quit smoking about 22 years ago.  Her smoking use included cigarettes. She smoked 0.50 packs per day. She has never used smokeless tobacco. She reports previous alcohol use of about 1.0 standard drinks of alcohol per week. She reports that she does not use drugs.  Allergies: No Known Allergies  Medications: I have reviewed the patient's current medications.  Results for orders placed or performed during the hospital encounter of 10/04/18 (from the past 48 hour(s))  CBC     Status: Abnormal   Collection Time: 10/04/18  9:25 PM  Result Value Ref Range   WBC 11.2 (H) 4.0 - 10.5 K/uL   RBC 4.30 3.87 - 5.11 MIL/uL   Hemoglobin 11.7 (L) 12.0 - 15.0 g/dL   HCT 36.6 36.0 - 46.0 %   MCV 85.1 80.0 - 100.0 fL   MCH 27.2 26.0 - 34.0 pg   MCHC 32.0 30.0 - 36.0 g/dL   RDW 15.2 11.5 - 15.5 %   Platelets 266 150 - 400 K/uL   nRBC 0.0 0.0 - 0.2 %    Comment: Performed at [facility], [address] [place], [place] [address]  Comprehensive metabolic panel     Status: Abnormal   Collection Time: 10/04/18  9:25 PM  Result Value Ref Range   Sodium 137 135 - 145 mmol/L   Potassium 3.9 3.5 - 5.1 mmol/L   Chloride 100 98 - 111 mmol/L   CO2 24 22 - 32 mmol/L   Glucose, Bld 216 (H) 70 - 99 mg/dL   BUN 37 (H) 8 - 23 mg/dL   Creatinine, Ser 1.48 (H) 0.44 - 1.00 mg/dL   Calcium 9.0 8.9 - 10.3 mg/dL   Total Protein 6.5 6.5 - 8.1 g/dL   Albumin 3.4 (L) 3.5 - 5.0 g/dL   AST 17 15 - 41 U/L   ALT 11 0 - 44 U/L   Alkaline Phosphatase 50 38 - 126 U/L   Total Bilirubin 0.5 0.3 - 1.2 mg/dL   GFR calc non Af Amer 32 (L) >60 mL/min   GFR calc Af Amer 37 (L) >60 mL/min   Anion gap 13 5 - 15    Comment: Performed at [facility], [address] [place], [place] [address]  Troponin I - ONCE - STAT     Status: Abnormal   Collection Time: 10/04/18  9:25 PM  Result Value Ref Range   Troponin I 0.70 (HH) <0.03 ng/mL    Comment: CRITICAL RESULT CALLED TO, READ BACK BY AND VERIFIED WITH JENNIFER ELLINGTON ON 10/04/18 AT 2216 BY  JAG Performed at [facility], [address] [place], [place] [address]   Prealbumin     Status: None   Collection Time: 10/04/18  9:25 PM  Result Value Ref Range   Prealbumin 21.4 18 - 38 mg/dL    Comment: Performed at [facility], [place], [place], [place] [address]  Troponin I - Once     Status: Abnormal   Collection Time: 10/05/18 12:11 AM  Result Value Ref Range   Troponin I 0.78 (HH) <0.03 ng/mL    Comment: CRITICAL VALUE NOTED. VALUE IS CONSISTENT WITH PREVIOUSLY REPORTED/CALLED VALUE.  TFK Performed at [facility], [address] [place], [place] [address]  Magnesium     Status: Abnormal   Collection Time: 10/05/18 12:11 AM  Result Value Ref Range   Magnesium 1.6 (L) 1.7 - 2.4 mg/dL    Comment: Performed at [facility], [address] [place], [place] [address]  Phosphorus     Status: None   Collection Time: 10/05/18 12:11 AM  Result Value Ref Range   Phosphorus 3.9 2.5 - 4.6 mg/dL    Comment: Performed at [facility], [address] [place], [place] [address]  Glucose, capillary     Status: Abnormal   Collection Time: 10/05/18  1:17 AM  Result Value Ref Range   Glucose-Capillary 133 (H) 70 - 99 mg/dL  Urinalysis, Complete w Microscopic     Status: Abnormal   Collection Time: 10/05/18  1:56 AM  Result Value Ref Range   Color, Urine YELLOW (A) YELLOW   APPearance CLEAR (A) CLEAR   Specific Gravity, Urine 1.017 1.005 - 1.030   pH 5.0 5.0 - 8.0   Glucose, UA NEGATIVE NEGATIVE mg/dL   Hgb urine dipstick NEGATIVE NEGATIVE   Bilirubin Urine NEGATIVE NEGATIVE   Ketones, ur NEGATIVE NEGATIVE mg/dL   Protein, ur NEGATIVE NEGATIVE mg/dL   Nitrite NEGATIVE NEGATIVE   Leukocytes, UA SMALL (A) NEGATIVE   RBC / HPF 0-5 0 - 5 RBC/hpf   WBC, UA 11-20 0 - 5 WBC/hpf   Bacteria, UA NONE SEEN NONE SEEN   Squamous Epithelial / LPF 0-5 0 - 5   Mucus PRESENT    Hyaline Casts, UA PRESENT    Cellular Cast, UA PRESENT      Comment: Performed at [facility], [address] [place], [place] [address]  Na and K (sodium & potassium), rand urine     Status: None   Collection Time: 10/05/18  1:56 AM  Result Value Ref Range   Sodium, Ur 50 mmol/L   Potassium Urine 36 mmol/L    Comment: Performed at [facility], [address] [place], [place] [address]  Creatinine, urine, random     Status: None   Collection Time: 10/05/18  1:56 AM  Result Value Ref Range   Creatinine, Urine 146 mg/dL    Comment: Performed at [facility], [address] [place], [place] [address]  Protein, urine, random     Status: None   Collection Time: 10/05/18  1:56 AM  Result Value Ref Range   Total Protein, Urine 26 mg/dL    Comment: NO NORMAL RANGE ESTABLISHED FOR THIS TEST Performed at [facility], [address] [place], [place] [address]   Troponin I - Tomorrow AM 0500     Status: Abnormal   Collection Time: 10/05/18  4:40 AM  Result Value Ref Range   Troponin I 0.63 (HH) <0.03 ng/mL    Comment: CRITICAL VALUE NOTED. VALUE IS CONSISTENT WITH PREVIOUSLY REPORTED/CALLED VALUE.  TFK Performed at [facility], [place], [place] [address]   Glucose, capillary     Status: Abnormal   Collection Time: 10/05/18 10:45 AM  Result Value Ref Range   Glucose-Capillary 237 (H) 70 - 99 mg/dL   Comment 1 Notify RN    Comment 2 Document in Chart     US Renal  Result Date: 10/05/2018 CLINICAL DATA:  Acute kidney injury. EXAM: RENAL / URINARY TRACT ULTRASOUND COMPLETE COMPARISON:  CT abdomen and pelvis 01/07/2018. FINDINGS: Right Kidney: Renal measurements: 9.1 x 4.7 x 6.2 cm = volume: 136.5 mL. Cortical echogenicity  is mildly increased. No mass or hydronephrosis visualized. Left Kidney: Renal measurements: 11.0 x 5.0 x 4.6 cm = volume: 131.0 ML. Cortical echogenicity is mildly increased. No mass or hydronephrosis visualized. Bladder: Appears normal for degree of bladder  distention. IMPRESSION: No acute abnormality.  Negative for hydronephrosis. Mildly increased cortical echogenicity compatible with medical renal disease. Electronically Signed   By: Inge Rise M.D.   On: 10/05/2018 10:13   US Carotid Bilateral  Result Date: 10/05/2018 CLINICAL DATA:  85 year old female with syncope, history of prior left carotid endarterectomy. EXAM: BILATERAL CAROTID DUPLEX ULTRASOUND TECHNIQUE: Pearline Cables scale imaging, color Doppler and duplex ultrasound were performed of bilateral carotid and vertebral arteries in the neck. COMPARISON:  Prior carotid duplex ultrasound 11/17/2017 FINDINGS: Criteria: Quantification of carotid stenosis is based on velocity parameters that correlate the residual internal carotid diameter with NASCET-based stenosis levels, using the diameter of the distal internal carotid lumen as the denominator for stenosis measurement. The following velocity measurements were obtained: RIGHT ICA: 150/25 cm/sec CCA: 509/32 cm/sec SYSTOLIC ICA/CCA RATIO:  1.0 ECA:  168 cm/sec LEFT ICA: 121/29 cm/sec CCA: 67/12 cm/sec SYSTOLIC ICA/CCA RATIO:  1.4 ECA:  176 cm/sec RIGHT CAROTID ARTERY: Heterogeneous and slightly irregular atherosclerotic plaque in the distal common carotid artery extending into the proximal internal carotid artery. By peak systolic velocity criteria, the estimated stenosis falls in the 50-69% diameter range. RIGHT VERTEBRAL ARTERY:  Patent with normal antegrade flow. LEFT CAROTID ARTERY: Heterogeneous atherosclerotic plaque in the distal common carotid artery. Surgical changes of carotid endarterectomy. The proximal internal carotid artery is widely patent. No significant atherosclerotic plaque. LEFT VERTEBRAL ARTERY: Relatively small in caliber but patent with antegrade flow. IMPRESSION: 1. Moderate (50-69%) stenosis proximal right internal carotid artery secondary to heterogenous atherosclerotic plaque. This represents slight interval progression of underlying  disease compared to 11/17/2017 when only mild stenosis was identified. 2. Stable postsurgical changes of prior left carotid endarterectomy without evidence of residual disease or stenosis. 3. Vertebral arteries remain patent with antegrade flow. Signed, Criselda Peaches, MD, [facility] Electronically Signed   By: Jacqulynn Cadet M.D.   On: 10/05/2018 12:33   Dg Chest Port 1 View  Result Date: 10/04/2018 CLINICAL DATA:  Lightheaded and weak today. Shortness of breath. EXAM: PORTABLE CHEST 1 VIEW COMPARISON:  08/18/2018 FINDINGS: Cardiac pacemaker. Mild cardiac enlargement. No vascular congestion, edema, or consolidation. Suggestion of mild fibrosis in the lung apices. Mediastinal contours appear intact. Calcification of the aorta. IMPRESSION: Cardiac enlargement. No evidence of active pulmonary disease. Electronically Signed   By: Lucienne Capers M.D.   On: 10/04/2018 22:01    Review of Systems  Constitutional: Positive for malaise/fatigue.  HENT: Negative.   Eyes: Negative.   Respiratory: Negative.   Cardiovascular: Positive for palpitations.  Gastrointestinal: Negative.   Genitourinary: Negative.   Musculoskeletal: Positive for myalgias.  Skin: Negative.   Neurological: Positive for dizziness and weakness.  Endo/Heme/Allergies: Negative.   Psychiatric/Behavioral: Negative.    Blood pressure (!) 148/59, pulse 62, temperature (!) 97.3 F (36.3 C), temperature source Oral, resp. rate 18, height 5\' 2"  (1.575 m), weight 78 kg, SpO2 93 %. Physical Exam  Nursing note and vitals reviewed. Constitutional: She is oriented to person, place, and time. She appears well-developed and well-nourished.  HENT:  Head: Normocephalic and atraumatic.  Eyes: Pupils are equal, round, and reactive to light. Conjunctivae and EOM are normal.  Neck: Normal range of motion. Neck supple.  Cardiovascular: Normal rate and regular  rhythm.  Murmur  heard. Respiratory: Effort normal and breath sounds normal.  GI: Soft. Bowel sounds are normal.  Musculoskeletal: Normal range of motion.  Neurological: She is alert and oriented to person, place, and time. She has normal reflexes.  Skin: Skin is warm and dry.  Psychiatric: She has a normal mood and affect.    Assessment/Plan: Vertigo Weakness Sick sinus syndrome Diabetes type 2 Hypertension Hyper lipidemia Coronary disease History of PCI and stent Dehydration Generalized fatigue Permanent pacemaker in place . Plan Recommend gentle hydration Continue regular medications consider reducing blood pressure medication Avoid alcohol consumption Consider support stockings Continue diabetes management and control Agree with lipid management and therapy Have the patient follow-up with cardiology 1 to 2 weeks  Bertis Hustead D Kolston [place] 10/05/2018, 6:57 PM

## 2018-10-05 NOTE — Discharge Summary (Signed)
[facility] NAME: Cassandra [name]    MR#:  [id number]  DATE OF BIRTH:  [birth year]  DATE OF ADMISSION:  10/04/2018 ADMITTING PHYSICIAN: Arta Silence, MD  DATE OF DISCHARGE: No discharge date for patient encounter.  PRIMARY CARE PHYSICIAN: Idelle Crouch, MD    ADMISSION DIAGNOSIS:  Weakness [R53.1] Troponin I above reference range [R79.89] AKI (acute kidney injury) ([place]) [N17.9]  DISCHARGE DIAGNOSIS:  Active Problems:   Near syncope   SECONDARY DIAGNOSIS:   Past Medical History:  Diagnosis Date  . Acquired hypothyroidism 01/06/2014  . Anxiety   . Arthritis   . B12 deficiency 02/15/2014  . Benign essential hypertension 01/06/2014  . Breast cancer [facility]) 1982   Right breast cancer - chemotherapy  . CAD (coronary artery disease), native coronary artery 01/06/2014  . Carotid artery calcification   . Chronic airway obstruction, not elsewhere classified 01/06/2014  . Depression   . Diabetes mellitus without complication ([place])   . History of kidney stones 2019   left ureteral stone  . Incontinence of urine   . Myocardial infarction ([place]) 1982   small infarct  . Pacemaker   . Presence of permanent cardiac pacemaker 07/2016  . Pure hypercholesterolemia 01/06/2014  . Renal insufficiency   . Skin cancer   . Sleep apnea    uses cpap    HOSPITAL COURSE:  *Acute hypotension With associated dizziness, weakness, near syncope Resolved Beta-blocker therapy was reduced in half, lisinopril was discontinued, patient did rule out for acute coronary syndrome with cardiac enzymes negative, cardiology did see patient while in house, and patient did well  *Acute near syncope Most likely secondary to above Resolved Echocardiogram from December 2019 noted for normal systolic ejection fraction, carotid Dopplers negative for increased right ICA stenosis 50-69% slightly worse than previous, patient to follow-up with cardiology/Dr.  Ubaldo Glassing for continued medical management of ICA stenosis  *Acute elevated troponins Inconsistent with ACS, cardiac enzymes negative Cardiology did see patient while in house, echocardiogram noted above, patient follow cardiology status post discharge for reevaluation in 1 to 2 weeks  *Acute questionable UTI Treated with Rocephin while in house, will complete antibiotic course of Omnicef for 2 more days  *History of sick sinus syndrome Stable Status post pacer  *Chronic diabetes mellitus type 2 Controlled on current regiment    DISCHARGE CONDITIONS:   stable  CONSULTS OBTAINED:  Treatment Team:  Yolonda Kida, MD  DRUG ALLERGIES:  No Known Allergies  DISCHARGE MEDICATIONS:   Allergies as of 10/05/2018   No Known Allergies     Medication List    STOP taking these medications   cephALEXin 250 MG capsule Commonly known as:  KEFLEX   lisinopril 20 MG tablet Commonly known as:  PRINIVIL,ZESTRIL     TAKE these medications   aspirin EC 81 MG tablet Take 81 mg by mouth daily.   CARAFATE 1 GM/10ML suspension Generic drug:  sucralfate Take by mouth.   carbidopa-levodopa 25-100 MG tablet Commonly known as:  SINEMET IR Take 1 tablet by mouth at bedtime.   cefdinir 300 MG capsule Commonly known as:  OMNICEF Take 1 capsule (300 mg total) by mouth 2 (two) times daily.   citalopram 40 MG tablet Commonly known as:  CELEXA TAKE ONE TABLET BY MOUTH EVERY NIGHT AT BEDTIME.   furosemide 40 MG tablet Commonly known as:  LASIX TAKE ONE TABLET TWICE DAILY   gabapentin 300 MG capsule Commonly known as:  NEURONTIN  Take by mouth.   HUMALOG KWIKPEN 100 UNIT/ML KwikPen Generic drug:  insulin lispro Inject 12 Units into the skin 3 (three) times daily. *Use as directed per sliding scale*   insulin glargine 100 UNIT/ML injection Commonly known as:  LANTUS Inject 0.5 mLs (50 Units total) into the skin at bedtime. What changed:  how much to take   levothyroxine 112 MCG  tablet Commonly known as:  SYNTHROID, LEVOTHROID Take 1 tablet (112 mcg total) by mouth daily before breakfast.   LORazepam 0.5 MG tablet Commonly known as:  ATIVAN Take 1 tablet (0.5 mg total) by mouth at bedtime.   metFORMIN 500 MG 24 hr tablet Commonly known as:  GLUCOPHAGE-XR Take 2 tablets (1,000 mg total) by mouth 2 (two) times daily. Please start this on 8/24   metoprolol succinate 25 MG 24 hr tablet Commonly known as:  TOPROL-XL Take 1 tablet (25 mg total) by mouth daily. Start taking on:  October 06, 2018 What changed:   medication strength how much to take additional instructions   multivitamin with minerals Tabs tablet Take 1 tablet by mouth daily.   nitroGLYCERIN 0.4 MG SL tablet Commonly known as:  NITROSTAT Place 1 tablet (0.4 mg total) under the tongue every 5 (five) minutes as needed for chest pain.   omeprazole 40 MG capsule Commonly known as:  PRILOSEC Take 40 mg by mouth daily.   ropinirole 5 MG tablet Commonly known as:  REQUIP Take 5 mg by mouth at bedtime.   rosuvastatin 10 MG tablet Commonly known as:  CRESTOR Take 2 tablets (20 mg total) by mouth at bedtime. What changed:  how much to take   tiotropium 18 MCG inhalation capsule Commonly known as:  SPIRIVA Place 1 capsule (18 mcg total) into inhaler and inhale every morning.   venlafaxine XR 75 MG 24 hr capsule Commonly known as:  EFFEXOR-XR Take 75 mg by mouth daily.   VESICARE 10 MG tablet Generic drug:  solifenacin Take 10 mg by mouth daily.   Vitamin D3 50 MCG (2000 UT) Tabs Take 2,000 Units by mouth daily.        DISCHARGE INSTRUCTIONS:      If you experience worsening of your admission symptoms, develop shortness of breath, life threatening emergency, suicidal or homicidal thoughts you must seek medical attention immediately by calling 911 or calling your MD immediately  if symptoms less severe.  You Must read complete instructions/literature along with all the possible  adverse reactions/side effects for all the Medicines you take and that have been prescribed to you. Take any new Medicines after you have completely understood and accept all the possible adverse reactions/side effects.   Please note  You were cared for by a hospitalist during your hospital stay. If you have any questions about your discharge medications or the care you received while you were in the hospital after you are discharged, you can call the unit and asked to speak with the hospitalist on call if the hospitalist that took care of you is not available. Once you are discharged, your primary care physician will handle any further medical issues. Please note that NO REFILLS for any discharge medications will be authorized once you are discharged, as it is imperative that you return to your primary care physician (or establish a relationship with a primary care physician if you do not have one) for your aftercare needs so that they can reassess your need for medications and monitor your lab values.    Today  CHIEF COMPLAINT:   Chief Complaint  Patient presents with  . Dizziness  . Weakness    HISTORY OF PRESENT ILLNESS:  85 y.o. female with a known history of T2IDDM, HTN, HLD, CAD/MI (s/p stent), hypothyroidism, bradycardia/SSS (s/p PPM) p/w fatigue/malaise/generalized weakness, lightheadedness/near-syncope. Pt states she has had mild intermittent lightheadedness x~2wks now, but states that it has been severe x1d. She states that she woke up on Sunday (10/04/2018) morning, sat up in bed and felt lightheaded. She states she continued to sit on the edge of the bed for some time. Her symptoms were persistent, and did not improve. She used her walker to get herself up, and walked to the living room, but states she was "worn out", (+) fatigue/malaise/generalized weakness, and sat down for ~1hr. She states she continued to feel poorly throughout the day, and endorses dehydration, poor PO intake  and loss of appetite. She endorses intermittent lightheadedness that was random at times, but was also made worse w/ position change and exertion. She states she went out for dinner, and then checked her BP when she was back home (@~2000PM), and states it was "low". Her husband drove her to the ED. SBP 90s-100s at the time of my assessment. Comfortable, does not appear septic/toxic. Saw Dr. Ubaldo Glassing 09/28/2018, note states, "Her pacemaker is functioning normally." Lisinopril dose was reduced (I believe from 20mg  to 10mg ) at that visit as well. Last Echo 08/19/2018, EF 55-65%.    VITAL SIGNS:  Blood pressure (!) 148/59, pulse 62, temperature (!) 97.3 F (36.3 C), temperature source Oral, resp. rate 18, height 5\' 2"  (1.575 m), weight 78 kg, SpO2 93 %.  I/O:    Intake/Output Summary (Last 24 hours) at 10/05/2018 1347 Last data filed at 10/05/2018 0949 Gross per 24 hour  Intake 1160.99 ml  Output 500 ml  Net 660.99 ml    PHYSICAL EXAMINATION:  GENERAL:  85 y.o.-year-old patient lying in the bed with no acute distress.  EYES: Pupils equal, round, reactive to light and accommodation. No scleral icterus. Extraocular muscles intact.  HEENT: Head atraumatic, normocephalic. Oropharynx and nasopharynx clear.  NECK:  Supple, no jugular venous distention. No thyroid enlargement, no tenderness.  LUNGS: Normal breath sounds bilaterally, no wheezing, rales,rhonchi or crepitation. No use of accessory muscles of respiration.  CARDIOVASCULAR: S1, S2 normal. No murmurs, rubs, or gallops.  ABDOMEN: Soft, non-tender, non-distended. Bowel sounds present. No organomegaly or mass.  EXTREMITIES: No pedal edema, cyanosis, or clubbing.  NEUROLOGIC: Cranial nerves II through XII are intact. Muscle strength 5/5 in all extremities. Sensation intact. Gait not checked.  PSYCHIATRIC: The patient is alert and oriented x 3.  SKIN: No obvious rash, lesion, or ulcer.   DATA REVIEW:   CBC Recent Labs  Lab 10/04/18 2125  WBC  11.2*  HGB 11.7*  HCT 36.6  PLT 266    Chemistries  Recent Labs  Lab 10/04/18 2125 10/05/18 0011  NA 137  --   K 3.9  --   CL 100  --   CO2 24  --   GLUCOSE 216*  --   BUN 37*  --   CREATININE 1.48*  --   CALCIUM 9.0  --   MG  --  1.6*  AST 17  --   ALT 11  --   ALKPHOS 50  --   BILITOT 0.5  --     Cardiac Enzymes Recent Labs  Lab 10/05/18 0440  TROPONINI 0.63*    Microbiology Results  Results for orders placed  or performed in visit on 10/27/17  Microscopic Examination     Status: Abnormal   Collection Time: 10/27/17  3:08 PM  Result Value Ref Range Status   WBC, UA 0-5 0 - 5 /hpf Final   RBC, UA None seen 0 - 2 /hpf Final   Epithelial Cells (non renal) 0-10 0 - 10 /hpf Final   Casts Present (A) None seen /lpf Final   Cast Type Hyaline casts N/A Final   Mucus, UA Present (A) Not Estab. Final   Bacteria, UA Few (A) None seen/Few Final    RADIOLOGY:  US Renal  Result Date: 10/05/2018 CLINICAL DATA:  Acute kidney injury. EXAM: RENAL / URINARY TRACT ULTRASOUND COMPLETE COMPARISON:  CT abdomen and pelvis 01/07/2018. FINDINGS: Right Kidney: Renal measurements: 9.1 x 4.7 x 6.2 cm = volume: 136.5 mL. Cortical echogenicity is mildly increased. No mass or hydronephrosis visualized. Left Kidney: Renal measurements: 11.0 x 5.0 x 4.6 cm = volume: 131.0 ML. Cortical echogenicity is mildly increased. No mass or hydronephrosis visualized. Bladder: Appears normal for degree of bladder distention. IMPRESSION: No acute abnormality.  Negative for hydronephrosis. Mildly increased cortical echogenicity compatible with medical renal disease. Electronically Signed   By: Inge Rise M.D.   On: 10/05/2018 10:13   US Carotid Bilateral  Result Date: 10/05/2018 CLINICAL DATA:  85 year old female with syncope, history of prior left carotid endarterectomy. EXAM: BILATERAL CAROTID DUPLEX ULTRASOUND TECHNIQUE: Pearline Cables scale imaging, color Doppler and duplex ultrasound were performed of bilateral  carotid and vertebral arteries in the neck. COMPARISON:  Prior carotid duplex ultrasound 11/17/2017 FINDINGS: Criteria: Quantification of carotid stenosis is based on velocity parameters that correlate the residual internal carotid diameter with NASCET-based stenosis levels, using the diameter of the distal internal carotid lumen as the denominator for stenosis measurement. The following velocity measurements were obtained: RIGHT ICA: 150/25 cm/sec CCA: 595/63 cm/sec SYSTOLIC ICA/CCA RATIO:  1.0 ECA:  168 cm/sec LEFT ICA: 121/29 cm/sec CCA: 87/56 cm/sec SYSTOLIC ICA/CCA RATIO:  1.4 ECA:  176 cm/sec RIGHT CAROTID ARTERY: Heterogeneous and slightly irregular atherosclerotic plaque in the distal common carotid artery extending into the proximal internal carotid artery. By peak systolic velocity criteria, the estimated stenosis falls in the 50-69% diameter range. RIGHT VERTEBRAL ARTERY:  Patent with normal antegrade flow. LEFT CAROTID ARTERY: Heterogeneous atherosclerotic plaque in the distal common carotid artery. Surgical changes of carotid endarterectomy. The proximal internal carotid artery is widely patent. No significant atherosclerotic plaque. LEFT VERTEBRAL ARTERY: Relatively small in caliber but patent with antegrade flow. IMPRESSION: 1. Moderate (50-69%) stenosis proximal right internal carotid artery secondary to heterogenous atherosclerotic plaque. This represents slight interval progression of underlying disease compared to 11/17/2017 when only mild stenosis was identified. 2. Stable postsurgical changes of prior left carotid endarterectomy without evidence of residual disease or stenosis. 3. Vertebral arteries remain patent with antegrade flow. Signed, Criselda Peaches, MD, [facility] Electronically Signed   By: Jacqulynn Cadet M.D.   On: 10/05/2018 12:33   Dg Chest Port 1 View  Result Date: 10/04/2018 CLINICAL DATA:  Lightheaded and  weak today. Shortness of breath. EXAM: PORTABLE CHEST 1 VIEW COMPARISON:  08/18/2018 FINDINGS: Cardiac pacemaker. Mild cardiac enlargement. No vascular congestion, edema, or consolidation. Suggestion of mild fibrosis in the lung apices. Mediastinal contours appear intact. Calcification of the aorta. IMPRESSION: Cardiac enlargement. No evidence of active pulmonary disease. Electronically Signed   By: Lucienne Capers M.D.   On: 10/04/2018 22:01    EKG:  Orders placed or performed during the hospital encounter of 10/04/18  . EKG 12-Lead  . EKG 12-Lead      Management plans discussed with the patient, family and they are in agreement.  CODE STATUS:     Code Status Orders  (From admission, onward)         Start     Ordered   10/04/18 2348  Full code  Continuous     10/04/18 2347        Code Status History    Date Active Date Inactive Code Status Order ID Comments User Context   08/18/2018 1539 08/21/2018 2246 Full Code [id number]  Saundra Shelling, MD Inpatient   02/03/2018 1644 02/03/2018 2036 Full Code [id number]  Hessie Knows, MD Inpatient   11/07/2017 1658 11/08/2017 1800 Full Code [id number]  Nicholes Mango, MD Inpatient   09/16/2017 1423 09/23/2017 1525 Full Code [id number]  Fritzi Mandes, MD Inpatient   08/17/2017 1958 08/20/2017 1447 Full Code [id number]  Idelle Crouch, MD Inpatient   07/03/2016 1637 07/04/2016 1255 Full Code [id number]  Isaias Cowman, MD Inpatient   06/14/2016 2344 06/16/2016 1358 Full Code [id number]  Harvie Bridge, DO Inpatient   06/07/2016 0128 06/08/2016 1512 Full Code [id number]  Lance Coon, MD Inpatient   04/23/2016 1404 04/24/2016 1435 Full Code [id number]  Yolonda Kida, MD Inpatient   04/21/2016 2018 04/22/2016 0859 Full Code [id number]  Idelle Crouch, MD Inpatient    Advance Directive Documentation     Most Recent Value  Type of Advance Directive  Healthcare Power of Attorney  Pre-existing out of facility DNR order (yellow form or pink MOST  form)  -  "MOST" Form in Place?  -      TOTAL TIME TAKING CARE OF THIS PATIENT: 40 minutes.    Avel Peace Koden Hunzeker M.D on 10/05/2018 at 1:47 PM  Between 7am to 6pm - Pager - [phone]  After 6pm go to [email or web address] - password [email or web address] [facility]  Office  [phone]  CC: Primary care physician; Idelle Crouch, MD   Note: This dictation was prepared with Dragon dictation along with smaller phrase technology. Any transcriptional errors that result from this process are unintentional.

## 2018-10-05 NOTE — Evaluation (Signed)
Physical Therapy Evaluation Patient Details Name: Cassandra [name] MRN: [id number] DOB: Aug 07, 1933 Today's Date: 10/05/2018   History of Present Illness  presented to ER secondary to progressive weakness, near-syncope; admitted for management of dehydration, AKI and mild elevation in troponin (ACS ruled out)  Clinical Impression  Upon evaluation, patient alert and oriented; follows commands and demonstrates good insight into deficits and safety needs.  Bilat UE/LE strength and ROM grossly symmetrical and WFL; no focal weakness, pain reported.  Vitals stable and WFL; no objective/subjective signs or symptoms of orthostasis.  Able to complete bed mobility with mod indep; sit/stand, basic transfers and gait (200') with RW, sup/mod indep.   Slow, but fairly steady gait performance (10' walk time, 10 seconds) without overt buckling or LOB; mildly antalgic towards the R. Would benefit from skilled PT to address above deficits and promote optimal return to PLOF; Recommend transition to [place] upon discharge from acute hospitalization (patient requesting resumption/completion of previous course of HHPT).     Follow Up Recommendations Home health PT(resumption of previous HHPT (nearing completion per patient report))    Equipment Recommendations  (has necessary equipment )    Recommendations for Other Services       Precautions / Restrictions Precautions Precautions: Fall;ICD/Pacemaker Restrictions Weight Bearing Restrictions: No      Mobility  Bed Mobility Overal bed mobility: Modified Independent Bed Mobility: Sit to Supine              Transfers Overall transfer level: Modified independent   Transfers: Sit to/from Stand           General transfer comment: performs with and without RW from various seating surfaces (recliner, edge of bed, standard toilet), mod indep.  Fair/good strength/power to bilat LEs  Ambulation/Gait Ambulation/Gait assistance: Supervision Gait  Distance (Feet): 200 Feet Assistive device: Rolling walker (2 wheeled)   Gait velocity: 10' walk time, 10 seconds Gait velocity interpretation: <1.31 ft/sec, indicative of household ambulator General Gait Details: reciprocal stepping, mildly antalgic towards R (patient reports as baseline).  Fair stability; completing gentle dynamic gait components without buckling or LOB.  Stairs            Wheelchair Mobility    Modified Rankin (Stroke Patients Only)       Balance Overall balance assessment: Needs assistance Sitting-balance support: No upper extremity supported;Feet supported Sitting balance-Leahy Scale: Good     Standing balance support: Bilateral upper extremity supported Standing balance-Leahy Scale: Fair                               Pertinent Vitals/Pain Pain Assessment: No/denies pain    Home Living Family/patient expects to be discharged to:: Private residence Living Arrangements: Spouse/significant other Available Help at Discharge: Family Type of Home: Apartment Home Access: Level entry     Home Layout: One level Home Equipment: Environmental consultant - 2 wheels;Cane - single point;Walker - 4 wheels;Shower seat      Prior Function Level of Independence: Independent         Comments: Mod indep without assist device for ADLs, household distances; intermittent use of 4WRW "when I feel weak or dizzy". Husband assists with driving and community needs, household chores/responsibilities.  Does endorse at least 2 falls in previous six months.     Hand Dominance        Extremity/Trunk Assessment   Upper Extremity Assessment Upper Extremity Assessment: Overall WFL for tasks assessed    Lower Extremity Assessment  Lower Extremity Assessment: Overall WFL for tasks assessed(grossly at least 4/5)       Communication   Communication: No difficulties  Cognition Arousal/Alertness: Awake/alert Behavior During Therapy: WFL for tasks  assessed/performed Overall Cognitive Status: Within Functional Limits for tasks assessed                                        General Comments      Exercises Other Exercises Other Exercises: 20' without assist device, close sup-mild sway with turns, obstacle negotiation, but self-corrects with LE step strategy. Intermittently reaches for walls/furniture as needed Other Exercises: Toilet transfer, ambulatory without RW, cga/close sup; sit/stand from standard toilet, mod indep.  Standing balance at sink for hand hygiene, close sup; good awareness of limits of stability and safety needs.   Assessment/Plan    PT Assessment Patient needs continued PT services  PT Problem List Decreased strength;Decreased mobility;Decreased balance       PT Treatment Interventions DME instruction;Gait training;Functional mobility training;Therapeutic activities;Therapeutic exercise;Balance training;Patient/family education    PT Goals (Current goals can be found in the Care Plan section)  Acute Rehab PT Goals Patient Stated Goal: to go home! PT Goal Formulation: With patient Time For Goal Achievement: 10/19/18 Potential to Achieve Goals: Good    Frequency Min 2X/week   Barriers to discharge        Co-evaluation               AM-PAC PT "6 Clicks" Mobility  Outcome Measure Help needed turning from your back to your side while in a flat bed without using bedrails?: None Help needed moving from lying on your back to sitting on the side of a flat bed without using bedrails?: None Help needed moving to and from a bed to a chair (including a wheelchair)?: None Help needed standing up from a chair using your arms (e.g., wheelchair or bedside chair)?: None Help needed to walk in hospital room?: None Help needed climbing 3-5 steps with a railing? : A Little 6 Click Score: 23    End of Session Equipment Utilized During Treatment: Gait belt Activity Tolerance: Patient tolerated  treatment well Patient left: in bed;with call bell/phone within reach;with bed alarm set;with family/visitor present Nurse Communication: Mobility status PT Visit Diagnosis: Muscle weakness (generalized) (M62.81)    Time: 2297-9892 PT Time Calculation (min) (ACUTE ONLY): 25 min   Charges:   PT Evaluation $PT Eval Low Complexity: 1 Low PT Treatments $Therapeutic Activity: 8-22 mins        Ellena Kamen H. Owens Shark, PT, DPT, NCS 10/05/18, 3:13 PM [phone]

## 2018-10-05 NOTE — Progress Notes (Signed)
Nutrition Brief Note  Patient identified on the Malnutrition Screening Tool (MST) Report  Cassandra [name] w/ PMHx T2IDDM, HTN, HLD, CAD/MI (s/p stent), hypothyroidism, bradycardia/SSS (s/p PPM) p/w fatigue/malaise/generalized weakness, lightheadedness/near-syncope.   Met with pt in room. Pt reports a slow decline in her appetite as she has gotten older but reports she eats well at home. Pt eating 100% of meals in hospital. Per chart, pt is weight stable. Pt would like to have Ensure as she reports that she wants to start drinking these at home. Pt unhappy that she could not have salt with her eggs this morning. RD will liberalize diet as pt not likely eating enough to exceed nutrient limits. RD will order Ensure Max protein supplement daily, each supplement provides 150kcal and 30g of protein.   Wt Readings from Last 15 Encounters:  10/05/18 78 kg  08/31/18 80.3 kg  08/19/18 72.5 kg  07/15/18 78 kg  02/23/18 80.3 kg  02/03/18 77.6 kg  02/02/18 77.6 kg  11/08/17 80.3 kg  09/30/17 80.3 kg  09/16/17 75.8 kg  09/12/17 75.8 kg  09/04/17 79.8 kg  08/20/17 81.8 kg  07/03/16 83.9 kg  06/27/16 83.9 kg    Body mass index is 31.45 kg/m. Patient meets criteria for obesity based on current BMI.   Current diet order is HH/CHO, patient is consuming approximately 100% of meals at this time. Labs and medications reviewed.   No nutrition interventions warranted at this time. If nutrition issues arise, please consult RD.   Koleen Distance MS, RD, LDN Pager #- [phone] Office#- [phone] After Hours Pager: [phone]

## 2018-10-05 NOTE — Progress Notes (Signed)
Discharged to home with husband.  Encouraged to increase her fluid intake, and to monitor her BP prior to take cardiac meds. Appointments set up, prescriptions given.

## 2018-10-06 LAB — UREA NITROGEN, URINE: Urea Nitrogen, Ur: 858 mg/dL

## 2018-10-06 LAB — URINE CULTURE: CULTURE: NO GROWTH

## 2018-12-14 DIAGNOSIS — F331 Major depressive disorder, recurrent, moderate: Secondary | ICD-10-CM | POA: Insufficient documentation

## 2019-04-12 ENCOUNTER — Other Ambulatory Visit: Payer: Self-pay

## 2019-04-12 ENCOUNTER — Ambulatory Visit: Payer: Medicare Other | Attending: Unknown Physician Specialty | Admitting: Physical Therapy

## 2019-04-12 ENCOUNTER — Encounter: Payer: Self-pay | Admitting: Physical Therapy

## 2019-04-12 DIAGNOSIS — R2681 Unsteadiness on feet: Secondary | ICD-10-CM | POA: Insufficient documentation

## 2019-04-12 DIAGNOSIS — R42 Dizziness and giddiness: Secondary | ICD-10-CM | POA: Diagnosis present

## 2019-04-12 DIAGNOSIS — H8112 Benign paroxysmal vertigo, left ear: Secondary | ICD-10-CM | POA: Diagnosis present

## 2019-04-12 DIAGNOSIS — R262 Difficulty in walking, not elsewhere classified: Secondary | ICD-10-CM | POA: Diagnosis present
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
## 2019-04-19 ENCOUNTER — Ambulatory Visit: Payer: Medicare Other | Admitting: Physical Therapy

## 2019-04-26 ENCOUNTER — Ambulatory Visit: Payer: Medicare Other | Admitting: Physical Therapy

## 2019-05-03 ENCOUNTER — Encounter: Payer: Medicare Other | Admitting: Physical Therapy

## 2019-05-17 ENCOUNTER — Encounter: Payer: Medicare Other | Admitting: Physical Therapy

## 2019-05-25 ENCOUNTER — Encounter: Payer: Medicare Other | Admitting: Physical Therapy

## 2019-06-01 ENCOUNTER — Encounter: Payer: Medicare Other | Admitting: Physical Therapy

## 2019-10-05 ENCOUNTER — Other Ambulatory Visit: Payer: Self-pay

## 2019-10-05 ENCOUNTER — Ambulatory Visit (INDEPENDENT_AMBULATORY_CARE_PROVIDER_SITE_OTHER): Payer: Medicare Other | Admitting: Podiatry

## 2019-10-05 DIAGNOSIS — M79671 Pain in right foot: Secondary | ICD-10-CM | POA: Diagnosis not present

## 2019-10-05 DIAGNOSIS — R234 Changes in skin texture: Secondary | ICD-10-CM

## 2019-10-05 DIAGNOSIS — L853 Xerosis cutis: Secondary | ICD-10-CM

## 2019-10-05 MED ORDER — AMMONIUM LACTATE 12 % EX LOTN: 1.0000 "application " | TOPICAL_LOTION | CUTANEOUS | 0 refills | Status: DC | PRN

## 2019-10-06 ENCOUNTER — Encounter: Payer: Self-pay | Admitting: Podiatry

## 2019-10-06 NOTE — Progress Notes (Signed)
Subjective:  Patient ID: Cassandra [name], female    DOB: 1933-07-10,  MRN: [id number]  Chief Complaint  Patient presents with  . Foot Pain    pt is here for an open lesion on the back of the left heel, pt states it has been going on for about a week. pt states that it is painful when walking.    86 y.o. female presents with the above complaint.  Patient presents for a open lesion in the back of the heel that it appears to be a fissure.  Patient states it started hurting about a week ago and has progressively gotten worse.  Patient states that the skin cracked open and has been causing her a lot of pain when ambulating.  She states that she does moisturize her feet daily with over-the-counter lotion.  Patient is a type II diabetic was concern for an open lesion/wound given her diabetic status.  Her last A1c was well controlled at 7.4.  She denies any other acute treatment options.  She is denies seeing anyone else for this.   Review of Systems: Negative except as noted in the HPI. Denies N/V/F/Ch.  Past Medical History:  Diagnosis Date  . Acquired hypothyroidism 01/06/2014  . Anxiety   . Arthritis   . B12 deficiency 02/15/2014  . Benign essential hypertension 01/06/2014  . Breast cancer [facility]) 1982   Right breast cancer - chemotherapy  . CAD (coronary artery disease), native coronary artery 01/06/2014  . Carotid artery calcification   . Chronic airway obstruction, not elsewhere classified 01/06/2014  . Depression   . Diabetes mellitus without complication ([place])   . History of kidney stones 2019   left ureteral stone  . Incontinence of urine   . Myocardial infarction ([place]) 1982   small infarct  . Pacemaker   . Presence of permanent cardiac pacemaker 07/2016  . Pure hypercholesterolemia 01/06/2014  . Renal insufficiency   . Skin cancer   . Sleep apnea    uses cpap    Current Outpatient Medications:  .  aspirin EC 81 MG tablet, Take 81 mg by mouth daily., Disp: , Rfl:  .  buPROPion  (WELLBUTRIN XL) 150 MG 24 hr tablet, Take by mouth daily., Disp: , Rfl:  .  carbidopa-levodopa (SINEMET IR) 25-100 MG tablet, Take 1 tablet by mouth at bedtime., Disp: , Rfl:  .  cefdinir (OMNICEF) 300 MG capsule, Take 1 capsule (300 mg total) by mouth 2 (two) times daily., Disp: 4 capsule, Rfl: 0 .  Cholecalciferol (VITAMIN D3) 2000 units TABS, Take 2,000 Units by mouth daily., Disp: , Rfl:  .  citalopram (CELEXA) 40 MG tablet, TAKE ONE TABLET BY MOUTH EVERY NIGHT AT BEDTIME., Disp: , Rfl:  .  Continuous Blood Gluc Receiver (FREESTYLE LIBRE 14 DAY READER) DEVI, Use 1 each as needed .  Use to test blood sugar, Disp: , Rfl:  .  Continuous Blood Gluc Sensor (FREESTYLE LIBRE 14 DAY SENSOR) MISC, Use 1 each every 14 (fourteen) days, Disp: , Rfl:  .  DULoxetine (CYMBALTA) 60 MG capsule, Take by mouth., Disp: , Rfl:  .  furosemide (LASIX) 40 MG tablet, TAKE ONE TABLET TWICE DAILY, Disp: , Rfl:  .  HUMALOG KWIKPEN 100 UNIT/ML KiwkPen, Inject 12 Units into the skin 3 (three) times daily. *Use as directed per sliding scale*, Disp: , Rfl:  .  insulin glargine (LANTUS) 100 UNIT/ML injection, Inject 0.5 mLs (50 Units total) into the skin at bedtime. (Patient taking differently: Inject 42 Units  into the skin at bedtime. ), Disp: , Rfl:  .  levothyroxine (SYNTHROID, LEVOTHROID) 112 MCG tablet, Take 1 tablet (112 mcg total) by mouth daily before breakfast., Disp: 30 tablet, Rfl: 0 .  LORazepam (ATIVAN) 0.5 MG tablet, Take 1 tablet (0.5 mg total) by mouth at bedtime., Disp: 30 tablet, Rfl: 0 .  magnesium oxide (MAG-OX) 400 MG tablet, Take 1 tablet by mouth 2 (two) times daily., Disp: , Rfl:  .  metFORMIN (GLUCOPHAGE-XR) 500 MG 24 hr tablet, Take 2 tablets (1,000 mg total) by mouth 2 (two) times daily. Please start this on 8/24, Disp: , Rfl:  .  metoprolol succinate (TOPROL-XL) 25 MG 24 hr tablet, Take 1 tablet (25 mg total) by mouth daily., Disp: 30 tablet, Rfl: 0 .  Multiple Vitamin (MULTIVITAMIN WITH MINERALS)  TABS tablet, Take 1 tablet by mouth daily., Disp: 30 tablet, Rfl: 0 .  nitroGLYCERIN (NITROSTAT) 0.4 MG SL tablet, Place 1 tablet (0.4 mg total) under the tongue every 5 (five) minutes as needed for chest pain., Disp: 90 tablet, Rfl: 0 .  pramipexole (MIRAPEX) 0.5 MG tablet, Take 0.5 mg by mouth at bedtime., Disp: , Rfl:  .  ropinirole (REQUIP) 5 MG tablet, Take 5 mg by mouth at bedtime. , Disp: , Rfl:  .  rosuvastatin (CRESTOR) 10 MG tablet, Take 2 tablets (20 mg total) by mouth at bedtime. (Patient taking differently: Take 10 mg by mouth at bedtime. ), Disp: 60 tablet, Rfl: 1 .  tiotropium (SPIRIVA) 18 MCG inhalation capsule, Place 1 capsule (18 mcg total) into inhaler and inhale every morning., Disp: 30 capsule, Rfl: 12 .  triamterene-hydrochlorothiazide (DYAZIDE) 37.5-25 MG capsule, TAKE 1 CAPSULE BY MOUTH EACH MORNING, Disp: , Rfl:  .  ammonium lactate (AMLACTIN) 12 % lotion, Apply 1 application topically as needed for dry skin., Disp: 400 g, Rfl: 0 .  omeprazole (PRILOSEC) 40 MG capsule, Take 40 mg by mouth daily. , Disp: , Rfl:  .  sucralfate (CARAFATE) 1 GM/10ML suspension, Take by mouth., Disp: , Rfl:  .  venlafaxine XR (EFFEXOR-XR) 75 MG 24 hr capsule, Take 75 mg by mouth daily., Disp: , Rfl:   Social History   Tobacco Use  Smoking Status Former Smoker  . Packs/day: 0.50  . Types: Cigarettes  . Quit date: 09/02/1996  . Years since quitting: 23.1  Smokeless Tobacco Never Used    No Known Allergies Objective:  There were no vitals filed for this visit. There is no height or weight on file to calculate BMI. Constitutional Well developed. Well nourished.  Vascular Dorsalis pedis pulses palpable bilaterally. Posterior tibial pulses palpable bilaterally. Capillary refill normal to all digits.  No cyanosis or clubbing noted. Pedal hair growth normal.  Neurologic Normal speech. Oriented to person, place, and time. Epicritic sensation to light touch grossly present bilaterally.   Dermatologic  right heel fissure superficial noted.  No underlying wounds or clinical concerns for infection noted.  Xerosis noted to the bilateral lower extremity.  No itching associated with this.  Orthopedic: Normal joint ROM without pain or crepitus bilaterally. No visible deformities. No bony tenderness.   Radiographs: None Assessment:   1. Fissure in skin   2. Xerosis of skin   3. Pain of right heel    Plan:  Patient was evaluated and treated and all questions answered.  Right heel fissure superficial secondary to xerosis -I explained to the patient the etiology of heel fissure and various treatment options associated with it.  Given the cold  weather outside patient is much more susceptible to dry skin especially in the setting of diabetes and therefore aggressive moisturization is recommended.  I explained to the patient that she should moisturize twice a day.  I also believe patient will benefit from ammonium lactate lotion to help decrease xerosis given that over-the-counter lotion treatments are not helping her. -At this time there is no clinical concern for developing wound as this fissure is superficial in nature without any exposure to deeper tissue. -Ammonium lactate was dispensed for severe xerosis. -Steri-Strips was applied to close the break in the superficial fissure in an attempt to allow the epidermal layer of the skin to close. No follow-ups on file.

## 2019-10-08 ENCOUNTER — Other Ambulatory Visit: Payer: Self-pay | Admitting: Internal Medicine

## 2019-10-08 DIAGNOSIS — R410 Disorientation, unspecified: Secondary | ICD-10-CM

## 2019-10-08 DIAGNOSIS — R441 Visual hallucinations: Secondary | ICD-10-CM

## 2019-10-08 DIAGNOSIS — Z950 Presence of cardiac pacemaker: Secondary | ICD-10-CM | POA: Insufficient documentation

## 2019-10-12 ENCOUNTER — Ambulatory Visit
Admission: RE | Admit: 2019-10-12 | Discharge: 2019-10-12 | Disposition: A | Discharge status: Home / Self Care | Payer: Medicare Other | Source: Ambulatory Visit | Attending: Internal Medicine | Admitting: Internal Medicine

## 2019-10-12 ENCOUNTER — Other Ambulatory Visit: Payer: Self-pay

## 2019-10-12 DIAGNOSIS — R410 Disorientation, unspecified: Secondary | ICD-10-CM | POA: Diagnosis present

## 2019-10-12 DIAGNOSIS — R441 Visual hallucinations: Secondary | ICD-10-CM | POA: Insufficient documentation

## 2019-10-13 ENCOUNTER — Ambulatory Visit: Payer: Medicare Other

## 2019-10-18 ENCOUNTER — Ambulatory Visit (INDEPENDENT_AMBULATORY_CARE_PROVIDER_SITE_OTHER): Payer: Medicare Other | Admitting: Vascular Surgery

## 2019-10-18 ENCOUNTER — Encounter (INDEPENDENT_AMBULATORY_CARE_PROVIDER_SITE_OTHER): Payer: Self-pay | Admitting: Vascular Surgery

## 2019-10-18 ENCOUNTER — Other Ambulatory Visit: Payer: Self-pay

## 2019-10-18 VITALS — BP 146/84 | HR 80 | Resp 16 | Wt 137.0 lb

## 2019-10-18 DIAGNOSIS — R634 Abnormal weight loss: Secondary | ICD-10-CM

## 2019-10-18 DIAGNOSIS — I10 Essential (primary) hypertension: Secondary | ICD-10-CM

## 2019-10-18 DIAGNOSIS — E1169 Type 2 diabetes mellitus with other specified complication: Secondary | ICD-10-CM

## 2019-10-18 DIAGNOSIS — I6523 Occlusion and stenosis of bilateral carotid arteries: Secondary | ICD-10-CM

## 2019-10-18 DIAGNOSIS — I25118 Atherosclerotic heart disease of native coronary artery with other forms of angina pectoris: Secondary | ICD-10-CM

## 2019-10-18 DIAGNOSIS — E118 Type 2 diabetes mellitus with unspecified complications: Secondary | ICD-10-CM

## 2019-10-18 DIAGNOSIS — E785 Hyperlipidemia, unspecified: Secondary | ICD-10-CM

## 2019-10-19 ENCOUNTER — Other Ambulatory Visit (INDEPENDENT_AMBULATORY_CARE_PROVIDER_SITE_OTHER): Payer: Self-pay | Admitting: Vascular Surgery

## 2019-10-19 DIAGNOSIS — K551 Chronic vascular disorders of intestine: Secondary | ICD-10-CM

## 2019-10-19 DIAGNOSIS — I6523 Occlusion and stenosis of bilateral carotid arteries: Secondary | ICD-10-CM

## 2019-10-19 DIAGNOSIS — I700 Atherosclerosis of aorta: Secondary | ICD-10-CM

## 2019-10-20 ENCOUNTER — Telehealth (INDEPENDENT_AMBULATORY_CARE_PROVIDER_SITE_OTHER): Payer: Self-pay

## 2019-10-20 ENCOUNTER — Encounter (INDEPENDENT_AMBULATORY_CARE_PROVIDER_SITE_OTHER): Payer: Self-pay | Admitting: Nurse Practitioner

## 2019-10-20 ENCOUNTER — Ambulatory Visit (INDEPENDENT_AMBULATORY_CARE_PROVIDER_SITE_OTHER): Payer: Medicare Other | Admitting: Nurse Practitioner

## 2019-10-20 ENCOUNTER — Ambulatory Visit (INDEPENDENT_AMBULATORY_CARE_PROVIDER_SITE_OTHER): Payer: Medicare Other

## 2019-10-20 ENCOUNTER — Other Ambulatory Visit: Payer: Self-pay

## 2019-10-20 VITALS — BP 118/74 | HR 65 | Resp 16 | Ht 62.0 in | Wt 137.0 lb

## 2019-10-20 DIAGNOSIS — I700 Atherosclerosis of aorta: Secondary | ICD-10-CM | POA: Diagnosis not present

## 2019-10-20 DIAGNOSIS — I6523 Occlusion and stenosis of bilateral carotid arteries: Secondary | ICD-10-CM

## 2019-10-20 DIAGNOSIS — K551 Chronic vascular disorders of intestine: Secondary | ICD-10-CM

## 2019-10-20 DIAGNOSIS — I10 Essential (primary) hypertension: Secondary | ICD-10-CM | POA: Diagnosis not present

## 2019-10-20 DIAGNOSIS — E1169 Type 2 diabetes mellitus with other specified complication: Secondary | ICD-10-CM | POA: Diagnosis not present

## 2019-10-20 DIAGNOSIS — I6529 Occlusion and stenosis of unspecified carotid artery: Secondary | ICD-10-CM | POA: Insufficient documentation

## 2019-10-20 DIAGNOSIS — E785 Hyperlipidemia, unspecified: Secondary | ICD-10-CM

## 2019-10-20 NOTE — Telephone Encounter (Signed)
Spoke with the patient's husband and she is now scheduled with Dr. Delana Meyer for a mesenteric angio on 11/03/19 with a 6:45 am arrival time to the [facility]. Patient will do covid testing on 11/01/19 between 12:30-2:30 pm. Pre-procedure instructions were discussed and will be mailed to the patient.

## 2019-10-20 NOTE — Progress Notes (Signed)
SUBJECTIVE:  Patient ID: Cassandra [name], female    DOB: 05-12-1933, 86 y.o.   MRN: [id number] Chief Complaint  Patient presents with  . Follow-up    ultrasound patient nauesous and dizzy    HPI  Cassandra [name] is a 86 y.o. female presents today for follow-up studies related to some dizziness as well as nausea that she has been having recently.  The patient states that the dizziness has been going on for several days however no exact cause of the dizziness has yet been found.  She also states that about a week ago she began to have random nausea spells and about 2 nights ago she has had several episodes of vomiting.  She also endorses having consistent constipation however stool softeners to help with this.  The patient denies any fever, chills, nausea, vomiting or diarrhea.  She denies any food phobia or abdominal pain after eating.  She denies any massive weight loss recently.  She denies any specific precipitating factor to any of this.  Today she had noninvasive studies done.  She had a carotid artery duplex done.  The patient previously had a left carotid endarterectomy.  Today noninvasive studies show a 1 to 39% stenosis in the bilateral internal carotid arteries.  Previous study suggested 50 to 69% in the right ICA on 10/05/2018.  Patient also underwent a mesenteric duplex.  The patient had her largest aortic diameter measurement at 1.8 cm.  She had a normal celiac artery, hepatic artery and splenic artery findings.  However, the patient had a 70 to 99% stenosis in the superior mesenteric artery, with velocities consistent with high-grade stenosis.  Past Medical History:  Diagnosis Date  . Acquired hypothyroidism 01/06/2014  . Anxiety   . Arthritis   . B12 deficiency 02/15/2014  . Benign essential hypertension 01/06/2014  . Breast cancer [facility]) 1982   Right breast cancer - chemotherapy  . CAD (coronary artery disease), native coronary artery 01/06/2014  . Carotid artery calcification    . Chronic airway obstruction, not elsewhere classified 01/06/2014  . Depression   . Diabetes mellitus without complication ([place])   . History of kidney stones 2019   left ureteral stone  . Incontinence of urine   . Myocardial infarction ([place]) 1982   small infarct  . Pacemaker   . Presence of permanent cardiac pacemaker 07/2016  . Pure hypercholesterolemia 01/06/2014  . Renal insufficiency   . Skin cancer   . Sleep apnea    uses cpap    Past Surgical History:  Procedure Laterality Date  . APPENDECTOMY    . AUGMENTATION MAMMAPLASTY Bilateral 1982/redo in 2014   prior mastectomy  . CARDIAC CATHETERIZATION N/A 04/23/2016   Procedure: Left Heart Cath and Coronary Angiography;  Surgeon: Corey Skains, MD;  Location: [place] [facility];  Service: Cardiovascular;  Laterality: N/A;  . CARDIAC CATHETERIZATION Left 04/23/2016   Procedure: Coronary Stent Intervention;  Surgeon: Yolonda Kida, MD;  Location: [place] [facility];  Service: Cardiovascular;  Laterality: Left;  . CAROTID ARTERY ANGIOPLASTY Left 2010   had stent inserted and removed d/t infection. artery from leg inserted in left carotid  . CAROTID ENDARTERECTOMY Left 2010  . CHOLECYSTECTOMY    . CORONARY ANGIOPLASTY WITH STENT PLACEMENT  september 9th 2017  . CYSTOSCOPY W/ URETERAL STENT PLACEMENT Left 08/17/2017   Procedure: CYSTOSCOPY WITH RETROGRADE PYELOGRAM/URETERAL STENT PLACEMENT;  Surgeon: Festus Aloe, MD;  Location: [facility];  Service: Urology;  Laterality: Left;  . CYSTOSCOPY  W/ URETERAL STENT PLACEMENT Left 09/16/2017   Procedure: CYSTOSCOPY WITH STENT REPLACEMENT;  Surgeon: Abbie Sons, MD;  Location: [facility];  Service: Urology;  Laterality: Left;  . CYSTOSCOPY/RETROGRADE/URETEROSCOPY/STONE EXTRACTION WITH BASKET Left 09/16/2017   Procedure: CYSTOSCOPY/RETROGRADE/URETEROSCOPY/STONE EXTRACTION WITH BASKET;  Surgeon: Abbie Sons, MD;  Location: [facility];  Service: Urology;  Laterality: Left;  . EYE  SURGERY Bilateral    cataract extraction  . KYPHOPLASTY N/A 02/03/2018   Procedure: [id number];  Surgeon: Hessie Knows, MD;  Location: [facility];  Service: Orthopedics;  Laterality: N/A;  . MASTECTOMY Right 1982  . PACEMAKER INSERTION Left 07/03/2016   Procedure: INSERTION PACEMAKER;  Surgeon: Isaias Cowman, MD;  Location: [facility];  Service: Cardiovascular;  Laterality: Left;  . SKIN CANCER EXCISION    . TUBAL LIGATION      Social History   Socioeconomic History  . Marital status: Married    Spouse name: Not on file  . Number of children: Not on file  . Years of education: Not on file  . Highest education level: Not on file  Occupational History  . Not on file  Tobacco Use  . Smoking status: Former Smoker    Packs/day: 0.50    Types: Cigarettes    Quit date: 09/02/1996    Years since quitting: 23.1  . Smokeless tobacco: Never Used  Substance and Sexual Activity  . Alcohol use: Not Currently    Alcohol/week: 1.0 standard drinks    Types: 1 Glasses of wine per week  . Drug use: No  . Sexual activity: Not Currently  Other Topics Concern  . Not on file  Social History Narrative  . Not on file   Social Determinants of Health   Financial Resource Strain:   . Difficulty of Paying Living Expenses: Not on file  Food Insecurity:   . Worried About Charity fundraiser in the Last Year: Not on file  . Ran Out of Food in the Last Year: Not on file  Transportation Needs:   . Lack of Transportation (Medical): Not on file  . Lack of Transportation (Non-Medical): Not on file  Physical Activity:   . Days of Exercise per Week: Not on file  . Minutes of Exercise per Session: Not on file  Stress:   . Feeling of Stress : Not on file  Social Connections:   . Frequency of Communication with Friends and Family: Not on file  . Frequency of Social Gatherings with Friends and Family: Not on file  . Attends Religious Services: Not on file  . Active Member of Clubs or Organizations:  Not on file  . Attends Archivist Meetings: Not on file  . Marital Status: Not on file  Intimate Partner Violence:   . Fear of Current or Ex-Partner: Not on file  . Emotionally Abused: Not on file  . Physically Abused: Not on file  . Sexually Abused: Not on file    Family History  Problem Relation Age of Onset  . Prostate cancer Neg Hx   . Kidney cancer Neg Hx   . Breast cancer Neg Hx     No Known Allergies   Review of Systems   Review of Systems: Negative Unless Checked Constitutional: [] Weight loss  [] Fever  [] Chills Cardiac: [] Chest pain   []  Atrial Fibrillation  [] Palpitations   [] Shortness of breath when laying flat   [] Shortness of breath with exertion. [] Shortness of breath at rest Vascular:  [] Pain in legs with walking   [] Pain  in legs with standing [] Pain in legs when laying flat   [] Claudication    [] Pain in feet when laying flat    [] History of DVT   [] Phlebitis   [x] Swelling in legs   [] Varicose veins   [] Non-healing ulcers Pulmonary:   [] Uses home oxygen   [] Productive cough   [] Hemoptysis   [] Wheeze  [] COPD   [] Asthma Neurologic:  [x] Dizziness   [] Seizures  [] Blackouts [] History of stroke   [] History of TIA  [] Aphasia   [] Temporary Blindness   [] Weakness or numbness in arm   [] Weakness or numbness in leg Musculoskeletal:   [] Joint swelling   [] Joint pain   [] Low back pain  []  History of Knee Replacement [] Arthritis [] back Surgeries  []  Spinal Stenosis    Hematologic:  [] Easy bruising  [] Easy bleeding   [] Hypercoagulable state   [] Anemic Gastrointestinal:  [x] Diarrhea   [x] Vomiting  [] Gastroesophageal reflux/heartburn   [] Difficulty swallowing. [] Abdominal pain Genitourinary:  [] Chronic kidney disease   [] Difficult urination  [] Anuric   [] Blood in urine [] Frequent urination  [] Burning with urination   [] Hematuria Skin:  [] Rashes   [] Ulcers [] Wounds Psychological:  [] History of anxiety   []  History of major depression  []  Memory Difficulties      OBJECTIVE:    Physical Exam  BP 118/74 (BP Location: Left Arm)   Pulse 65   Resp 16   Ht 5\' 2"  (1.575 m)   Wt 137 lb (62.1 kg)   LMP  (LMP Unknown) Comment: age 68  BMI 25.06 kg/m   Gen: WD/WN, NAD Head: [place]/AT, No temporalis wasting.  Ear/Nose/Throat: Hearing grossly intact, nares w/o erythema or drainage Eyes: PER, EOMI, sclera nonicteric.  Neck: Supple, no masses.  No JVD.  Pulmonary:  Good air movement, no use of accessory muscles.  Cardiac: RRR Vascular: no carotid bruit  Vessel Right Left  Radial Palpable Palpable   Gastrointestinal: soft, non-distended. No guarding/no peritoneal signs.  Musculoskeletal: M/S 5/5 throughout.  No deformity or atrophy.  Neurologic: Pain and light touch intact in extremities.  Symmetrical.  Speech is fluent. Motor exam as listed above. Psychiatric: Judgment intact, Mood & affect appropriate for pt's clinical situation. Dermatologic: No Venous rashes. No Ulcers Noted.  No changes consistent with cellulitis. Lymph : No Cervical lymphadenopathy, no lichenification or skin changes of chronic lymphedema.       ASSESSMENT AND PLAN:  1. Atherosclerosis of superior mesenteric artery (HCC) Recommend:  The patient has evidence of severe atherosclerotic changes of the mesenteric arteries associated with weight loss as well as abdominal pain and N/V.  This represents a high risk for bowel infarction and death.  Patient should undergo angiography of the mesenteric arteries with the hope for intervention to eliminate the ischemic symptoms.    The risks and benefits as well as the alternative therapies was discussed in detail with the patient.  All questions were answered.  Patient agrees to proceed with angiography and intervention.  The patient will follow up with me after the angiogram.  2. Bilateral carotid artery stenosis Recommend:  Given the patient's asymptomatic subcritical stenosis no further invasive testing or surgery at this time.  Duplex  ultrasound shows 1-39% stenosis bilaterally.  Continue antiplatelet therapy as prescribed Continue management of CAD, HTN and Hyperlipidemia Healthy heart diet,  encouraged exercise at least 4 times per week Follow up in 12 months with duplex ultrasound and physical exam   3. Benign essential hypertension Continue antihypertensive medications as already ordered, these medications have been reviewed and there are  no changes at this time.   4. Hyperlipidemia associated with type 2 diabetes mellitus ([place]) Continue statin as ordered and reviewed, no changes at this time    Current Outpatient Medications on File Prior to Visit  Medication Sig Dispense Refill  . ammonium lactate (AMLACTIN) 12 % lotion Apply 1 application topically as needed for dry skin. 400 g 0  . aspirin EC 81 MG tablet Take 81 mg by mouth daily.    [name] buPROPion (WELLBUTRIN XL) 150 MG 24 hr tablet Take by mouth daily.    . carbidopa-levodopa (SINEMET IR) 25-100 MG tablet Take 1 tablet by mouth at bedtime.    . cefdinir (OMNICEF) 300 MG capsule Take 1 capsule (300 mg total) by mouth 2 (two) times daily. 4 capsule 0  . Cholecalciferol (VITAMIN D3) 2000 units TABS Take 2,000 Units by mouth daily.    . citalopram (CELEXA) 40 MG tablet TAKE ONE TABLET BY MOUTH EVERY NIGHT AT BEDTIME.    . clopidogrel (PLAVIX) 75 MG tablet Take 75 mg by mouth daily.    . Continuous Blood Gluc Receiver (FREESTYLE LIBRE 14 DAY READER) DEVI Use 1 each as needed .  Use to test blood sugar    . Continuous Blood Gluc Sensor (FREESTYLE LIBRE 14 DAY SENSOR) MISC Use 1 each every 14 (fourteen) days    . DULoxetine (CYMBALTA) 60 MG capsule Take by mouth.    . furosemide (LASIX) 40 MG tablet TAKE ONE TABLET TWICE DAILY    . HUMALOG KWIKPEN 100 UNIT/ML KiwkPen Inject 12 Units into the skin 3 (three) times daily. *Use as directed per sliding scale*    . insulin glargine (LANTUS) 100 UNIT/ML injection Inject 0.5 mLs (50 Units total) into the skin at bedtime.  (Patient taking differently: Inject 42 Units into the skin at bedtime. )    . levothyroxine (SYNTHROID, LEVOTHROID) 112 MCG tablet Take 1 tablet (112 mcg total) by mouth daily before breakfast. 30 tablet 0  . LORazepam (ATIVAN) 0.5 MG tablet Take 1 tablet (0.5 mg total) by mouth at bedtime. 30 tablet 0  . magnesium oxide (MAG-OX) 400 MG tablet Take 1 tablet by mouth 2 (two) times daily.    . metFORMIN (GLUCOPHAGE-XR) 500 MG 24 hr tablet Take 2 tablets (1,000 mg total) by mouth 2 (two) times daily. Please start this on 8/24    . metoprolol succinate (TOPROL-XL) 25 MG 24 hr tablet Take 1 tablet (25 mg total) by mouth daily. 30 tablet 0  . Multiple Vitamin (MULTIVITAMIN WITH MINERALS) TABS tablet Take 1 tablet by mouth daily. 30 tablet 0  . nitroGLYCERIN (NITROSTAT) 0.4 MG SL tablet Place 1 tablet (0.4 mg total) under the tongue every 5 (five) minutes as needed for chest pain. 90 tablet 0  . pramipexole (MIRAPEX) 0.5 MG tablet Take 0.5 mg by mouth at bedtime.    . ropinirole (REQUIP) 5 MG tablet Take 5 mg by mouth at bedtime.     . rosuvastatin (CRESTOR) 10 MG tablet Take 2 tablets (20 mg total) by mouth at bedtime. (Patient taking differently: Take 10 mg by mouth at bedtime. ) 60 tablet 1  . tiotropium (SPIRIVA) 18 MCG inhalation capsule Place 1 capsule (18 mcg total) into inhaler and inhale every morning. 30 capsule 12  . triamterene-hydrochlorothiazide (DYAZIDE) 37.5-25 MG capsule TAKE 1 CAPSULE BY MOUTH EACH MORNING    . omeprazole (PRILOSEC) 40 MG capsule Take 40 mg by mouth daily.     . sucralfate (CARAFATE) 1 GM/10ML suspension Take by  mouth.    . venlafaxine XR (EFFEXOR-XR) 75 MG 24 hr capsule Take 75 mg by mouth daily.     No current facility-administered medications on file prior to visit.    There are no Patient Instructions on file for this visit. No follow-ups on file.   Kris Hartmann, NP  This note was completed with Sales executive.  Any errors are purely unintentional.

## 2019-10-21 ENCOUNTER — Encounter (INDEPENDENT_AMBULATORY_CARE_PROVIDER_SITE_OTHER): Payer: Self-pay | Admitting: Vascular Surgery

## 2019-10-21 DIAGNOSIS — R634 Abnormal weight loss: Secondary | ICD-10-CM | POA: Insufficient documentation

## 2019-10-21 NOTE — Progress Notes (Signed)
MRN : [id number]  Cassandra [name] is a 86 y.o. (Dec 09, 1932) female who presents with chief complaint of  Chief Complaint  Patient presents with  . Follow-up    ref [place] carotid stenosis  .  History of Present Illness:   The patient is seen for evaluation of a syncopal episode. The patient describes it as a light headedness and denies the "room spinning"..  It lasted on the order of minutes and resolved completely.  There was no loss of consciousness.  There have been two or three prior episodes over the past several years.  There is no recent history of TIA symptoms or focal motor deficits. There is no prior documented CVA.  The patient was not taking enteric-coated aspirin 81 mg daily at the time.  There is no history of migraine headaches or prior diagnosis of ocular migraine. There is no history of seizures.  The patient has also noted some weight loss as well as nausea.  The patient does substantiate food fear, particular foods do not seem to aggravate or alleviate the symptoms.  The patient denies bloody bowel movements or diarrhea.  The patient has a history of colonoscopy which was not diagnostic.  No history of peptic ulcer disease.   No prior peripheral angiograms or vascular interventions.  The patient has a history of coronary artery disease, no recent episodes of angina or shortness of breath. The patient denies PAD or claudication symptoms. There is a history of hyperlipidemia which is being treated with a statin.    Current Meds  Medication Sig  . ammonium lactate (AMLACTIN) 12 % lotion Apply 1 application topically as needed for dry skin.  [name] aspirin EC 81 MG tablet Take 81 mg by mouth daily.  [name] buPROPion (WELLBUTRIN XL) 150 MG 24 hr tablet Take by mouth daily.  . carbidopa-levodopa (SINEMET IR) 25-100 MG tablet Take 1 tablet by mouth at bedtime.  . cefdinir (OMNICEF) 300 MG capsule Take 1 capsule (300 mg total) by mouth 2 (two) times daily.  .  Cholecalciferol (VITAMIN D3) 2000 units TABS Take 2,000 Units by mouth daily.  . citalopram (CELEXA) 40 MG tablet TAKE ONE TABLET BY MOUTH EVERY NIGHT AT BEDTIME.  [name] Continuous Blood Gluc Receiver (FREESTYLE LIBRE 14 DAY READER) DEVI Use 1 each as needed .  Use to test blood sugar  . Continuous Blood Gluc Sensor (FREESTYLE LIBRE 14 DAY SENSOR) MISC Use 1 each every 14 (fourteen) days  . DULoxetine (CYMBALTA) 60 MG capsule Take by mouth.  . furosemide (LASIX) 40 MG tablet TAKE ONE TABLET TWICE DAILY  . HUMALOG KWIKPEN 100 UNIT/ML KiwkPen Inject 12 Units into the skin 3 (three) times daily. *Use as directed per sliding scale*  . insulin glargine (LANTUS) 100 UNIT/ML injection Inject 0.5 mLs (50 Units total) into the skin at bedtime. (Patient taking differently: Inject 42 Units into the skin at bedtime. )  . levothyroxine (SYNTHROID, LEVOTHROID) 112 MCG tablet Take 1 tablet (112 mcg total) by mouth daily before breakfast.  . LORazepam (ATIVAN) 0.5 MG tablet Take 1 tablet (0.5 mg total) by mouth at bedtime.  . magnesium oxide (MAG-OX) 400 MG tablet Take 1 tablet by mouth 2 (two) times daily.  . metFORMIN (GLUCOPHAGE-XR) 500 MG 24 hr tablet Take 2 tablets (1,000 mg total) by mouth 2 (two) times daily. Please start this on 8/24  . metoprolol succinate (TOPROL-XL) 25 MG 24 hr tablet Take 1 tablet (25 mg total) by mouth daily.  . Multiple Vitamin (  MULTIVITAMIN WITH MINERALS) TABS tablet Take 1 tablet by mouth daily.  . nitroGLYCERIN (NITROSTAT) 0.4 MG SL tablet Place 1 tablet (0.4 mg total) under the tongue every 5 (five) minutes as needed for chest pain.  . pramipexole (MIRAPEX) 0.5 MG tablet Take 0.5 mg by mouth at bedtime.  . ropinirole (REQUIP) 5 MG tablet Take 5 mg by mouth at bedtime.   . rosuvastatin (CRESTOR) 10 MG tablet Take 2 tablets (20 mg total) by mouth at bedtime. (Patient taking differently: Take 10 mg by mouth at bedtime. )  . tiotropium (SPIRIVA) 18 MCG inhalation capsule Place 1 capsule  (18 mcg total) into inhaler and inhale every morning.  . triamterene-hydrochlorothiazide (DYAZIDE) 37.5-25 MG capsule TAKE 1 CAPSULE BY MOUTH EACH MORNING    Past Medical History:  Diagnosis Date  . Acquired hypothyroidism 01/06/2014  . Anxiety   . Arthritis   . B12 deficiency 02/15/2014  . Benign essential hypertension 01/06/2014  . Breast cancer [facility]) 1982   Right breast cancer - chemotherapy  . CAD (coronary artery disease), native coronary artery 01/06/2014  . Carotid artery calcification   . Chronic airway obstruction, not elsewhere classified 01/06/2014  . Depression   . Diabetes mellitus without complication ([place])   . History of kidney stones 2019   left ureteral stone  . Incontinence of urine   . Myocardial infarction ([place]) 1982   small infarct  . Pacemaker   . Presence of permanent cardiac pacemaker 07/2016  . Pure hypercholesterolemia 01/06/2014  . Renal insufficiency   . Skin cancer   . Sleep apnea    uses cpap    Past Surgical History:  Procedure Laterality Date  . APPENDECTOMY    . AUGMENTATION MAMMAPLASTY Bilateral 1982/redo in 2014   prior mastectomy  . CARDIAC CATHETERIZATION N/A 04/23/2016   Procedure: Left Heart Cath and Coronary Angiography;  Surgeon: Corey Skains, MD;  Location: [place] [facility];  Service: Cardiovascular;  Laterality: N/A;  . CARDIAC CATHETERIZATION Left 04/23/2016   Procedure: Coronary Stent Intervention;  Surgeon: Yolonda Kida, MD;  Location: [place] [facility];  Service: Cardiovascular;  Laterality: Left;  . CAROTID ARTERY ANGIOPLASTY Left 2010   had stent inserted and removed d/t infection. artery from leg inserted in left carotid  . CAROTID ENDARTERECTOMY Left 2010  . CHOLECYSTECTOMY    . CORONARY ANGIOPLASTY WITH STENT PLACEMENT  september 9th 2017  . CYSTOSCOPY W/ URETERAL STENT PLACEMENT Left 08/17/2017   Procedure: CYSTOSCOPY WITH RETROGRADE PYELOGRAM/URETERAL STENT PLACEMENT;  Surgeon: Festus Aloe, MD;  Location:  [facility];  Service: Urology;  Laterality: Left;  . CYSTOSCOPY W/ URETERAL STENT PLACEMENT Left 09/16/2017   Procedure: CYSTOSCOPY WITH STENT REPLACEMENT;  Surgeon: Abbie Sons, MD;  Location: [facility];  Service: Urology;  Laterality: Left;  . CYSTOSCOPY/RETROGRADE/URETEROSCOPY/STONE EXTRACTION WITH BASKET Left 09/16/2017   Procedure: CYSTOSCOPY/RETROGRADE/URETEROSCOPY/STONE EXTRACTION WITH BASKET;  Surgeon: Abbie Sons, MD;  Location: [facility];  Service: Urology;  Laterality: Left;  . EYE SURGERY Bilateral    cataract extraction  . KYPHOPLASTY N/A 02/03/2018   Procedure: [id number];  Surgeon: Hessie Knows, MD;  Location: [facility];  Service: Orthopedics;  Laterality: N/A;  . MASTECTOMY Right 1982  . PACEMAKER INSERTION Left 07/03/2016   Procedure: INSERTION PACEMAKER;  Surgeon: Isaias Cowman, MD;  Location: [facility];  Service: Cardiovascular;  Laterality: Left;  . SKIN CANCER EXCISION    . TUBAL LIGATION      Social History Social History   Tobacco Use  . Smoking  status: Former Smoker    Packs/day: 0.50    Types: Cigarettes    Quit date: 09/02/1996    Years since quitting: 23.1  . Smokeless tobacco: Never Used  Substance Use Topics  . Alcohol use: Not Currently    Alcohol/week: 1.0 standard drinks    Types: 1 Glasses of wine per week  . Drug use: No    Family History Family History  Problem Relation Age of Onset  . Prostate cancer Neg Hx   . Kidney cancer Neg Hx   . Breast cancer Neg Hx     No Known Allergies   REVIEW OF SYSTEMS (Negative unless checked)  Constitutional: [] Weight loss  [] Fever  [] Chills Cardiac: [] Chest pain   [] Chest pressure   [] Palpitations   [] Shortness of breath when laying flat   [] Shortness of breath with exertion. Vascular:  [] Pain in legs with walking   [] Pain in legs at rest  [] History of DVT   [] Phlebitis   [] Swelling in legs   [] Varicose veins   [] Non-healing ulcers Pulmonary:   [] Uses home oxygen   [] Productive cough    [] Hemoptysis   [] Wheeze  [] COPD   [] Asthma Neurologic:  [x] Dizziness   [] Seizures   [] History of stroke   [x] History of TIA  [] Aphasia   [] Vissual changes   [] Weakness or numbness in arm   [] Weakness or numbness in leg Musculoskeletal:   [] Joint swelling   [] Joint pain   [] Low back pain Hematologic:  [] Easy bruising  [] Easy bleeding   [] Hypercoagulable state   [] Anemic Gastrointestinal:  [] Diarrhea   [] Vomiting  [x] Gastroesophageal reflux/heartburn   [] Difficulty swallowing. Genitourinary:  [] Chronic kidney disease   [] Difficult urination  [] Frequent urination   [] Blood in urine Skin:  [] Rashes   [] Ulcers  Psychological:  [] History of anxiety   []  History of major depression.  Physical Examination  Vitals:   10/18/19 1614  BP: (!) 146/84  Pulse: 80  Resp: 16  Weight: 137 lb (62.1 kg)   Body mass index is 25.06 kg/m. Gen: WD/WN, NAD Head: [place]/AT, No temporalis wasting.  Ear/Nose/Throat: Hearing grossly intact, nares w/o erythema or drainage Eyes: PER, EOMI, sclera nonicteric.  Neck: Supple, no large masses.   Pulmonary:  Good air movement, no audible wheezing bilaterally, no use of accessory muscles.  Cardiac: RRR, no JVD Vascular:  No bruits Vessel Right Left  Radial Palpable Palpable  Brachial Palpable Palpable  Carotid Palpable Palpable  Gastrointestinal: Non-distended. No guarding/no peritoneal signs.  Musculoskeletal: M/S 5/5 throughout.  No deformity or atrophy.  Neurologic: CN 2-12 intact. Symmetrical.  Speech is fluent. Motor exam as listed above. Psychiatric: Judgment intact, Mood & affect appropriate for pt's clinical situation. Dermatologic: No rashes or ulcers noted.  No changes consistent with cellulitis. Lymph : No lichenification or skin changes of chronic lymphedema.  CBC Lab Results  Component Value Date   WBC 11.2 (H) 10/04/2018   HGB 11.7 (L) 10/04/2018   HCT 36.6 10/04/2018   MCV 85.1 10/04/2018   PLT 266 10/04/2018    BMET    Component Value  Date/Time   NA 137 10/04/2018 2125   NA 140 02/23/2014 1406   K 3.9 10/04/2018 2125   K 3.9 02/23/2014 1406   CL 100 10/04/2018 2125   CL 103 02/23/2014 1406   CO2 24 10/04/2018 2125   CO2 30 02/23/2014 1406   GLUCOSE 216 (H) 10/04/2018 2125   GLUCOSE 70 02/23/2014 1406   BUN 37 (H) 10/04/2018 2125   BUN 16 02/23/2014  1406   CREATININE 1.48 (H) 10/04/2018 2125   CREATININE 0.75 02/23/2014 1406   CALCIUM 9.0 10/04/2018 2125   CALCIUM 9.3 02/23/2014 1406   GFRNONAA 32 (L) 10/04/2018 2125   GFRNONAA >60 02/23/2014 1406   GFRAA 37 (L) 10/04/2018 2125   GFRAA >60 02/23/2014 1406   CrCl cannot be calculated (Patient's most recent lab result is older than the maximum 21 days allowed.).  COAG Lab Results  Component Value Date   INR 0.99 08/18/2018   INR 1.22 08/17/2017   INR 1.01 06/27/2016    Radiology CT HEAD WO CONTRAST  Result Date: 10/13/2019 CLINICAL DATA:  Disorientation. Hallucination, visual. Additional history provided by scanning technologist: Patient reports fall hitting right forehead on Saturday, confusion and disorientation yesterday, history of breast cancer. EXAM: CT HEAD WITHOUT CONTRAST TECHNIQUE: Contiguous axial images were obtained from the base of the skull through the vertex without intravenous contrast. COMPARISON:  Head CT 08/18/2018 FINDINGS: Brain: No evidence of acute intracranial hemorrhage. No demarcated cortical infarction. No evidence of intracranial mass. No midline shift or extra-axial fluid collection. A small lacunar infarct within the lateral left cerebellar hemisphere is new as compared to prior examination 08/18/2018 but otherwise age indeterminate (series 3, image 7) (series 4, image 74). Mild generalized parenchymal atrophy. Partially empty sella turcica. Vascular: No hyperdense vessel.  Atherosclerotic calcifications. Skull: Normal. Negative for fracture or focal lesion. Sinuses/Orbits: Visualized orbits demonstrate no acute abnormality. No  significant paranasal sinus disease or mastoid effusion at the imaged levels. These results will be called to the ordering clinician or representative by the Radiologist Assistant, and communication documented in the PACS or zVision Dashboard. IMPRESSION: 1. A small lacunar infarct within the lateral left cerebellar hemisphere is new as compared to prior examination 08/18/2018 but otherwise age indeterminate. 2. No evidence of acute intracranial hemorrhage. 3. Mild generalized parenchymal atrophy. Electronically Signed   By: Kellie Simmering DO   On: 10/13/2019 07:42     Assessment/Plan 1. Bilateral carotid artery stenosis Recommend:  Given the patient's dizziness.  Duplex ultrasound will be ordered stat.  Continue antiplatelet therapy as prescribed Continue management of CAD, HTN and Hyperlipidemia Healthy heart diet,  encouraged exercise at least 4 times per week Follow up in one week with duplex ultrasound and physical exam   - VAS US CAROTID; Future  2. Loss of weight Recommend:  The patient has evidence of severe atherosclerotic changes of the mesenteric arteries associated with weight loss as well as abdominal pain and N/V.  This represents a high risk for bowel infarction and death.  Patient should undergo duplex ultrasound of the mesenteric arteries ASAP.    The patient will follow up with me after the duplex. - VAS [place] MESENTERIC; Future  3. Benign essential hypertension Continue antihypertensive medications as already ordered, these medications have been reviewed and there are no changes at this time.   4. Coronary artery disease of native artery of native heart with stable angina pectoris (HCC) Continue cardiac and antihypertensive medications as already ordered and reviewed, no changes at this time.  Continue statin as ordered and reviewed, no changes at this time  Nitrates PRN for chest pain   5. Type II diabetes mellitus with manifestations ([place]) Continue  hypoglycemic medications as already ordered, these medications have been reviewed and there are no changes at this time.  Hgb A1C to be monitored as already arranged by primary service   6. Hyperlipidemia associated with type 2 diabetes mellitus ([place]) Continue statin as ordered  and reviewed, no changes at this time     Hortencia Pilar, MD  10/21/2019 12:09 PM

## 2019-11-01 ENCOUNTER — Other Ambulatory Visit: Payer: Self-pay

## 2019-11-01 ENCOUNTER — Other Ambulatory Visit
Admission: RE | Admit: 2019-11-01 | Discharge: 2019-11-01 | Disposition: A | Discharge status: Home / Self Care | Payer: Medicare Other | Source: Ambulatory Visit | Attending: Vascular Surgery | Admitting: Vascular Surgery

## 2019-11-01 DIAGNOSIS — Z01812 Encounter for preprocedural laboratory examination: Secondary | ICD-10-CM | POA: Insufficient documentation

## 2019-11-01 DIAGNOSIS — R441 Visual hallucinations: Secondary | ICD-10-CM | POA: Insufficient documentation

## 2019-11-01 DIAGNOSIS — R296 Repeated falls: Secondary | ICD-10-CM | POA: Insufficient documentation

## 2019-11-01 DIAGNOSIS — Z20822 Contact with and (suspected) exposure to covid-19: Secondary | ICD-10-CM | POA: Diagnosis not present

## 2019-11-01 DIAGNOSIS — R42 Dizziness and giddiness: Secondary | ICD-10-CM | POA: Insufficient documentation

## 2019-11-01 DIAGNOSIS — R1110 Vomiting, unspecified: Secondary | ICD-10-CM | POA: Insufficient documentation

## 2019-11-02 LAB — SARS CORONAVIRUS 2 (TAT 6-24 HRS): SARS Coronavirus 2: NEGATIVE

## 2019-11-03 ENCOUNTER — Ambulatory Visit
Admission: RE | Admit: 2019-11-03 | Discharge: 2019-11-03 | Disposition: A | Discharge status: Home / Self Care | Payer: Medicare Other | Attending: Vascular Surgery | Admitting: Vascular Surgery

## 2019-11-03 ENCOUNTER — Other Ambulatory Visit (INDEPENDENT_AMBULATORY_CARE_PROVIDER_SITE_OTHER): Payer: Self-pay | Admitting: Nurse Practitioner

## 2019-11-03 ENCOUNTER — Encounter: Payer: Self-pay | Admitting: Vascular Surgery

## 2019-11-03 ENCOUNTER — Encounter
Admission: RE | Disposition: A | Discharge status: Home / Self Care | Payer: Self-pay | Source: Home / Self Care | Attending: Vascular Surgery

## 2019-11-03 ENCOUNTER — Other Ambulatory Visit: Payer: Self-pay

## 2019-11-03 DIAGNOSIS — Z7902 Long term (current) use of antithrombotics/antiplatelets: Secondary | ICD-10-CM | POA: Insufficient documentation

## 2019-11-03 DIAGNOSIS — E1169 Type 2 diabetes mellitus with other specified complication: Secondary | ICD-10-CM | POA: Insufficient documentation

## 2019-11-03 DIAGNOSIS — I6523 Occlusion and stenosis of bilateral carotid arteries: Secondary | ICD-10-CM | POA: Diagnosis not present

## 2019-11-03 DIAGNOSIS — E785 Hyperlipidemia, unspecified: Secondary | ICD-10-CM | POA: Insufficient documentation

## 2019-11-03 DIAGNOSIS — Z794 Long term (current) use of insulin: Secondary | ICD-10-CM | POA: Diagnosis not present

## 2019-11-03 DIAGNOSIS — Z87891 Personal history of nicotine dependence: Secondary | ICD-10-CM | POA: Insufficient documentation

## 2019-11-03 DIAGNOSIS — Z7982 Long term (current) use of aspirin: Secondary | ICD-10-CM | POA: Insufficient documentation

## 2019-11-03 DIAGNOSIS — M1990 Unspecified osteoarthritis, unspecified site: Secondary | ICD-10-CM | POA: Diagnosis not present

## 2019-11-03 DIAGNOSIS — G4730 Sleep apnea, unspecified: Secondary | ICD-10-CM | POA: Diagnosis not present

## 2019-11-03 DIAGNOSIS — E039 Hypothyroidism, unspecified: Secondary | ICD-10-CM | POA: Insufficient documentation

## 2019-11-03 DIAGNOSIS — Z9011 Acquired absence of right breast and nipple: Secondary | ICD-10-CM | POA: Diagnosis not present

## 2019-11-03 DIAGNOSIS — Z79899 Other long term (current) drug therapy: Secondary | ICD-10-CM | POA: Insufficient documentation

## 2019-11-03 DIAGNOSIS — Z79890 Hormone replacement therapy (postmenopausal): Secondary | ICD-10-CM | POA: Insufficient documentation

## 2019-11-03 DIAGNOSIS — I252 Old myocardial infarction: Secondary | ICD-10-CM | POA: Insufficient documentation

## 2019-11-03 DIAGNOSIS — K551 Chronic vascular disorders of intestine: Secondary | ICD-10-CM

## 2019-11-03 DIAGNOSIS — K559 Vascular disorder of intestine, unspecified: Secondary | ICD-10-CM

## 2019-11-03 DIAGNOSIS — I10 Essential (primary) hypertension: Secondary | ICD-10-CM | POA: Insufficient documentation

## 2019-11-03 DIAGNOSIS — Z960 Presence of urogenital implants: Secondary | ICD-10-CM | POA: Insufficient documentation

## 2019-11-03 DIAGNOSIS — Z955 Presence of coronary angioplasty implant and graft: Secondary | ICD-10-CM | POA: Insufficient documentation

## 2019-11-03 HISTORY — DX: Myoneural disorder, unspecified: G70.9

## 2019-11-03 HISTORY — PX: VISCERAL ANGIOGRAPHY: CATH118276

## 2019-11-03 LAB — CREATININE, SERUM
Creatinine, Ser: 0.96 mg/dL (ref 0.44–1.00)
GFR calc Af Amer: >60 mL/min (ref >60)
GFR calc non Af Amer: 54 mL/min — ABNORMAL LOW (ref >60)

## 2019-11-03 LAB — GLUCOSE, CAPILLARY: Glucose-Capillary: 112 mg/dL — ABNORMAL HIGH (ref 70–99)

## 2019-11-03 LAB — BUN: BUN: 17 mg/dL (ref 8–23)

## 2019-11-03 SURGERY — VISCERAL ANGIOGRAPHY
Anesthesia: Moderate Sedation

## 2019-11-03 MED ORDER — CLOPIDOGREL BISULFATE 300 MG PO TABS
300.0000 mg | ORAL_TABLET | ORAL | Status: AC
  Administered 2019-11-03: 300 mg via ORAL

## 2019-11-03 MED ORDER — SODIUM CHLORIDE 0.9 % IV SOLN: INTRAVENOUS | Status: DC

## 2019-11-03 MED ORDER — SODIUM CHLORIDE 0.9% FLUSH: 3.0000 mL | INTRAVENOUS | Status: DC | PRN

## 2019-11-03 MED ORDER — CLOPIDOGREL BISULFATE 75 MG PO TABS: 75.0000 mg | ORAL_TABLET | Freq: Every day | ORAL | 4 refills | Status: DC

## 2019-11-03 MED ORDER — CEFAZOLIN SODIUM-DEXTROSE 2-4 GM/100ML-% IV SOLN
2.0000 g | Freq: Once | INTRAVENOUS | Status: AC
  Administered 2019-11-03: 2 g via INTRAVENOUS

## 2019-11-03 MED ORDER — MIDAZOLAM HCL 5 MG/5ML IJ SOLN
INTRAMUSCULAR | Status: AC
  Filled 2019-11-03: qty 5

## 2019-11-03 MED ORDER — HYDRALAZINE HCL 20 MG/ML IJ SOLN: 5.0000 mg | INTRAMUSCULAR | Status: DC | PRN

## 2019-11-03 MED ORDER — FENTANYL CITRATE (PF) 100 MCG/2ML IJ SOLN
INTRAMUSCULAR | Status: AC
  Filled 2019-11-03: qty 2

## 2019-11-03 MED ORDER — LABETALOL HCL 5 MG/ML IV SOLN: 10.0000 mg | INTRAVENOUS | Status: DC | PRN

## 2019-11-03 MED ORDER — SODIUM CHLORIDE 0.9 % IV BOLUS: 250.0000 mL | Freq: Once | INTRAVENOUS | Status: DC

## 2019-11-03 MED ORDER — HEPARIN SODIUM (PORCINE) 1000 UNIT/ML IJ SOLN
INTRAMUSCULAR | Status: DC | PRN
  Administered 2019-11-03: 4000 [IU] via INTRAVENOUS

## 2019-11-03 MED ORDER — DIPHENHYDRAMINE HCL 50 MG/ML IJ SOLN: 50.0000 mg | Freq: Once | INTRAMUSCULAR | Status: DC | PRN

## 2019-11-03 MED ORDER — OXYCODONE HCL 5 MG PO TABS: 5.0000 mg | ORAL_TABLET | ORAL | Status: DC | PRN

## 2019-11-03 MED ORDER — CEFAZOLIN SODIUM-DEXTROSE 2-4 GM/100ML-% IV SOLN
INTRAVENOUS | Status: AC
  Filled 2019-11-03: qty 100

## 2019-11-03 MED ORDER — SODIUM CHLORIDE 0.9% FLUSH: 3.0000 mL | Freq: Two times a day (BID) | INTRAVENOUS | Status: DC

## 2019-11-03 MED ORDER — MIDAZOLAM HCL 2 MG/ML PO SYRP: 8.0000 mg | ORAL_SOLUTION | Freq: Once | ORAL | Status: DC | PRN

## 2019-11-03 MED ORDER — MIDAZOLAM HCL 2 MG/2ML IJ SOLN
INTRAMUSCULAR | Status: DC | PRN
  Administered 2019-11-03: 1 mg via INTRAVENOUS
  Administered 2019-11-03: 2 mg via INTRAVENOUS

## 2019-11-03 MED ORDER — FENTANYL CITRATE (PF) 100 MCG/2ML IJ SOLN
INTRAMUSCULAR | Status: DC | PRN
  Administered 2019-11-03 (×2): 50 ug via INTRAVENOUS

## 2019-11-03 MED ORDER — HYDROMORPHONE HCL 1 MG/ML IJ SOLN: 1.0000 mg | Freq: Once | INTRAMUSCULAR | Status: DC | PRN

## 2019-11-03 MED ORDER — ONDANSETRON HCL 4 MG/2ML IJ SOLN: 4.0000 mg | Freq: Four times a day (QID) | INTRAMUSCULAR | Status: DC | PRN

## 2019-11-03 MED ORDER — IODIXANOL 320 MG/ML IV SOLN
INTRAVENOUS | Status: DC | PRN
  Administered 2019-11-03: 50 mL via INTRA_ARTERIAL

## 2019-11-03 MED ORDER — FAMOTIDINE 20 MG PO TABS: 40.0000 mg | ORAL_TABLET | Freq: Once | ORAL | Status: DC | PRN

## 2019-11-03 MED ORDER — ATORVASTATIN CALCIUM 10 MG PO TABS
10.0000 mg | ORAL_TABLET | Freq: Every day | ORAL | Status: DC
  Filled 2019-11-03: qty 1

## 2019-11-03 MED ORDER — SODIUM CHLORIDE 0.9 % IV SOLN
INTRAVENOUS | Status: AC | PRN
  Administered 2019-11-03: 250 mL/h via INTRAVENOUS

## 2019-11-03 MED ORDER — HEPARIN SODIUM (PORCINE) 1000 UNIT/ML IJ SOLN
INTRAMUSCULAR | Status: AC
  Filled 2019-11-03: qty 1

## 2019-11-03 MED ORDER — MORPHINE SULFATE (PF) 4 MG/ML IV SOLN: 2.0000 mg | INTRAVENOUS | Status: DC | PRN

## 2019-11-03 MED ORDER — SODIUM CHLORIDE 0.9 % IV SOLN: 250.0000 mL | INTRAVENOUS | Status: DC | PRN

## 2019-11-03 MED ORDER — METHYLPREDNISOLONE SODIUM SUCC 125 MG IJ SOLR: 125.0000 mg | Freq: Once | INTRAMUSCULAR | Status: DC | PRN

## 2019-11-03 MED ORDER — ACETAMINOPHEN 325 MG PO TABS: 650.0000 mg | ORAL_TABLET | ORAL | Status: DC | PRN

## 2019-11-03 SURGICAL SUPPLY — 20 items
CANNULA 5F STIFF (CANNULA) ×3 IMPLANT
CATH BEACON 5 .035 65 KMP TIP (CATHETERS) ×3 IMPLANT
CATH PIG 70CM (CATHETERS) ×3 IMPLANT
CATH VS15FR (CATHETERS) ×3 IMPLANT
DEVICE PRESTO INFLATION (MISCELLANEOUS) ×3 IMPLANT
DEVICE STARCLOSE SE CLOSURE (Vascular Products) ×3 IMPLANT
DEVICE TORQUE .025-.038 (MISCELLANEOUS) ×3 IMPLANT
GLIDEWIRE STIFF .35X180X3 HYDR (WIRE) ×3 IMPLANT
GUIDEWIRE SUPER STIFF .035X180 (WIRE) ×3 IMPLANT
NEEDLE ENTRY 21GA 7CM ECHOTIP (NEEDLE) ×3 IMPLANT
PACK ANGIOGRAPHY (CUSTOM PROCEDURE TRAY) ×3 IMPLANT
SET INTRO CAPELLA COAXIAL (SET/KITS/TRAYS/PACK) ×3 IMPLANT
SHEATH BRITE TIP 5FRX11 (SHEATH) ×3 IMPLANT
SHEATH BRITE TIP 6FRX11 (SHEATH) ×3 IMPLANT
SHEATH DESTIN RDC 6FR 45 (SHEATH) ×3 IMPLANT
STENT LIFESTREAM 6X16X80 (Permanent Stent) ×3 IMPLANT
STENT LIFESTREAM 7X16X80 (Permanent Stent) ×3 IMPLANT
SYR MEDRAD MARK 7 150ML (SYRINGE) ×3 IMPLANT
TUBING CONTRAST HIGH PRESS 72 (TUBING) ×3 IMPLANT
WIRE GUIDERIGHT .035X150 (WIRE) ×3 IMPLANT

## 2019-11-03 NOTE — H&P (Signed)
[facility] History & Physical Update  The patient was interviewed and re-examined.  The patient's previous History and Physical has been reviewed and is unchanged.  There is no change in the plan of care. We plan to proceed with the scheduled procedure.  Hortencia Pilar, MD  11/03/2019, 8:02 AM

## 2019-11-03 NOTE — Op Note (Signed)
[facility] Percutaneous Study/Intervention Procedural Note   Date of Surgery: 11/03/2019  Surgeon(s): Hortencia Pilar   Assistants:None  Pre-operative Diagnosis: Chronic mesenteric ischemia Post-operative diagnosis: Same  Procedure(s) Performed: 1. Ultrasound guidance for vascular access right antegrade right femoral artery 2. Catheter placement into SMA from right femoral approach 3. Aortogram and selective SMA angiogram 4. Balloon expandable stent placement to the superior mesenteric artery using a 6 mm diameter x 16 mm length lifestream balloon expandable stent and then a 7 mm x 16 mm length lifestream in series 5. StarClose closure device right femoral artery  Anesthesia: local with moderate conscious sedation for approximately 40 minutes using Versed and Fentanyl intravenously.  Continuous ECG pulse oximetry and cardiopulmonary monitoring was performed throughout the entire procedure by the interventional radiology nurse total sedation time is 40 minutes  Fluoro Time: 7.4 minutes  Contrast: 50 cc  Indications: Patient is a chronic mesenteric ischemia.  Angiogram is performed to evaluate the lesion more thoroughly and potentially allow treatment. Risks and benefits were discussed and informed consent was obtained  Procedure: The patient was identified and appropriate procedural time out was performed. The patient was then placed supine on the table and prepped and draped in the usual sterile fashion.Moderate conscious sedation was administered during a face to face encounter with the patient with the RN monitoring their vital signs, mental status, telemetry and pulse oximetry throughout the procedure.  Ultrasound was used to evaluate the right common femoral artery. It was patent . A digital ultrasound image was acquired. A Seldinger needle was used to access the right common  femoral artery under direct ultrasound guidance and a permanent image was performed. A 0.035 J wire was advanced without resistance and a 5Fr sheath was placed. Pigtail catheter was then placed into the aorta and a lateral projection view was performed which showed typical orientations of the celiac and SMA.  I then selective cannulated the SMA with a VS 1 catheter. Selective imaging of the superior mesenteric artery demonstrated greater than 90% stenosis at the origin of the SMA. The patient was then given 4000 units of intravenous heparin and I exchanged for a 6 French sheath and a stiff angled glide wire to perform treatment. I was able to navigate across the lesion without difficulty with a Glidewire and then exchanged for a Magic torque wire. I then selected a 6 mm diameter by 16 mm length lifestream balloon expandable stent and deployed this across the stenosis at the origin of the superior mesenteric artery I then used a 7 mm x 16 mm lifestream balloon expandable stent in series so that the stent ended just back into the aorta in excellent location. The balloon was inflated to 12 atm.  Less than 5% was seen on completion angiogram and I elected to terminate the procedure. The guide catheter was removed and oblique arteriogram was performed of the right femoral artery. StarClose closure device was deployed in the usual fashion with excellent hemostatic result.  Findings:  Aortogram:Diffuse atherosclerotic changes but there are no hemodynamically significant lesions noted EXB:[id number] than 90% stenosis at the origin of the SMA.  Distally the SMA is diffusely calcific and disease but there are no focal hemodynamically significant lesions noted.  Following angioplasty the 16 mm stent was not quite long enough and I added a second stent extending slightly back into the aorta in excellent position.  There is less than 5% residual stenosis.     Disposition: Patient  was taken to the  recovery room in stable condition having tolerated the procedure well.   Hortencia Pilar 11/03/2019 9:41 AM   This note was created with Dragon Medical transcription system. Any errors in dictation are purely unintentional.

## 2019-11-03 NOTE — Progress Notes (Signed)
MD made aware of BUN/CRE. GFR : 54. 250 ml NS bolus ordered and started.

## 2019-11-03 NOTE — Discharge Instructions (Signed)

## 2019-11-05 ENCOUNTER — Encounter: Payer: Self-pay | Admitting: Cardiology

## 2019-11-05 ENCOUNTER — Telehealth (INDEPENDENT_AMBULATORY_CARE_PROVIDER_SITE_OTHER): Payer: Self-pay

## 2019-11-05 NOTE — Telephone Encounter (Signed)
There should not be a needle left in her groin, as we typically the use sheaths and those are very large and hard to miss.  The patient likely still has a dressing covering her site.  She may feel something hard which is the closure device that we used to close the artery after entering.  She can remove the dressing tomorrow.  As far as the closure device it stays and it does not need removal.  The patient can continue to keep her follow-up appointment that is scheduled for 11/29/2019.

## 2019-11-05 NOTE — Telephone Encounter (Signed)
The pt called and left a voicemail saying she has what she thinks is a  needle in her groin area from her procedure earlier this week and wants to know is it ok for her to remove it or does she need to come into the office for an appointment. Please advise.

## 2019-11-05 NOTE — Telephone Encounter (Signed)
I called the pt and made her aware of the above.

## 2019-11-08 ENCOUNTER — Ambulatory Visit: Payer: Medicare Other | Admitting: Podiatry

## 2019-11-11 ENCOUNTER — Other Ambulatory Visit: Payer: Self-pay | Admitting: Gastroenterology

## 2019-11-11 DIAGNOSIS — R1310 Dysphagia, unspecified: Secondary | ICD-10-CM

## 2019-11-11 DIAGNOSIS — R1319 Other dysphagia: Secondary | ICD-10-CM

## 2019-11-11 DIAGNOSIS — K222 Esophageal obstruction: Secondary | ICD-10-CM

## 2019-11-16 ENCOUNTER — Other Ambulatory Visit: Payer: Self-pay | Admitting: Gastroenterology

## 2019-11-16 ENCOUNTER — Ambulatory Visit
Admission: RE | Admit: 2019-11-16 | Discharge: 2019-11-16 | Disposition: A | Discharge status: Home / Self Care | Payer: Medicare Other | Source: Ambulatory Visit | Attending: Gastroenterology | Admitting: Gastroenterology

## 2019-11-16 ENCOUNTER — Other Ambulatory Visit: Payer: Self-pay

## 2019-11-16 DIAGNOSIS — R1310 Dysphagia, unspecified: Secondary | ICD-10-CM

## 2019-11-16 DIAGNOSIS — R1319 Other dysphagia: Secondary | ICD-10-CM

## 2019-11-16 DIAGNOSIS — K222 Esophageal obstruction: Secondary | ICD-10-CM | POA: Insufficient documentation

## 2019-11-29 ENCOUNTER — Encounter (INDEPENDENT_AMBULATORY_CARE_PROVIDER_SITE_OTHER): Payer: Self-pay | Admitting: Vascular Surgery

## 2019-11-29 ENCOUNTER — Ambulatory Visit (INDEPENDENT_AMBULATORY_CARE_PROVIDER_SITE_OTHER): Payer: Medicare Other

## 2019-11-29 ENCOUNTER — Ambulatory Visit (INDEPENDENT_AMBULATORY_CARE_PROVIDER_SITE_OTHER): Payer: Medicare Other | Admitting: Vascular Surgery

## 2019-11-29 ENCOUNTER — Other Ambulatory Visit: Payer: Self-pay

## 2019-11-29 VITALS — BP 195/88 | HR 70 | Ht 62.0 in | Wt 139.0 lb

## 2019-11-29 DIAGNOSIS — I6523 Occlusion and stenosis of bilateral carotid arteries: Secondary | ICD-10-CM | POA: Diagnosis not present

## 2019-11-29 DIAGNOSIS — I4892 Unspecified atrial flutter: Secondary | ICD-10-CM | POA: Diagnosis not present

## 2019-11-29 DIAGNOSIS — I25118 Atherosclerotic heart disease of native coronary artery with other forms of angina pectoris: Secondary | ICD-10-CM

## 2019-11-29 DIAGNOSIS — R634 Abnormal weight loss: Secondary | ICD-10-CM | POA: Diagnosis not present

## 2019-11-29 DIAGNOSIS — E785 Hyperlipidemia, unspecified: Secondary | ICD-10-CM

## 2019-11-29 DIAGNOSIS — E1169 Type 2 diabetes mellitus with other specified complication: Secondary | ICD-10-CM

## 2019-11-29 DIAGNOSIS — K551 Chronic vascular disorders of intestine: Secondary | ICD-10-CM

## 2019-11-29 DIAGNOSIS — E7800 Pure hypercholesterolemia, unspecified: Secondary | ICD-10-CM

## 2019-11-29 NOTE — Progress Notes (Signed)
MRN : [id number]  Cassandra [name] is a 86 y.o. (Jan 02, 1933) female who presents with chief complaint of No chief complaint on file. [name]  History of Present Illness: The patient returns to the office for follow-up regarding chronic mesenteric ischemia associated with stenosis of the SMA.  Angiogram on 11/03/2019: She is s/p balloon expandable stent placement to the superior mesenteric artery using a 6 mm diameter x 16 mm length lifestream balloon expandable stent and then a 7 mm x 16 mm length lifestream in series    The patient denies abdominal pain or postprandial symptoms.  The patient denies weight loss as well as nausea.  The patient does not substantiate food fear, particular foods do not seem to aggravate or alleviate the symptoms.  The patient denies bloody bowel movements or diarrhea.  The patient has a history of colonoscopy which was not diagnostic.  No history of peptic ulcer disease.   No prior peripheral angiograms or vascular interventions.  The patient denies amaurosis fugax or recent TIA symptoms. There are no recent neurological changes noted. The patient denies claudication symptoms or rest pain symptoms. The patient denies history of DVT, PE or superficial thrombophlebitis. The patient denies recent episodes of angina   Duplex ultrasound of the superior mesenteric artery shows the stent is patent and the velocities are significantly improved compared to the preintervention study.  No outpatient medications have been marked as taking for the 11/29/19 encounter (Appointment) with Delana Meyer, Dolores Lory, MD.    Past Medical History:  Diagnosis Date  . Acquired hypothyroidism 01/06/2014  . Anxiety   . Arthritis   . B12 deficiency 02/15/2014  . Benign essential hypertension 01/06/2014  . Breast cancer [facility]) 1982   Right breast cancer - chemotherapy  . CAD (coronary artery disease), native coronary artery 01/06/2014  . Carotid artery calcification   . Chronic airway  obstruction, not elsewhere classified 01/06/2014  . Depression   . Diabetes mellitus without complication ([place])   . History of kidney stones 2019   left ureteral stone  . Incontinence of urine   . Myocardial infarction ([place]) 1982   small infarct  . Neuromuscular disorder (HCC)    restless legs  . Pacemaker   . Presence of permanent cardiac pacemaker 07/2016  . Pure hypercholesterolemia 01/06/2014  . Renal insufficiency   . Skin cancer   . Sleep apnea    uses cpap    Past Surgical History:  Procedure Laterality Date  . APPENDECTOMY    . AUGMENTATION MAMMAPLASTY Bilateral 1982/redo in 2014   prior mastectomy  . CARDIAC CATHETERIZATION N/A 04/23/2016   Procedure: Left Heart Cath and Coronary Angiography;  Surgeon: Corey Skains, MD;  Location: [place] [facility];  Service: Cardiovascular;  Laterality: N/A;  . CARDIAC CATHETERIZATION Left 04/23/2016   Procedure: Coronary Stent Intervention;  Surgeon: Yolonda Kida, MD;  Location: [place] [facility];  Service: Cardiovascular;  Laterality: Left;  . CAROTID ARTERY ANGIOPLASTY Left 2010   had stent inserted and removed d/t infection. artery from leg inserted in left carotid  . CAROTID ENDARTERECTOMY Left 2010  . CHOLECYSTECTOMY    . CORONARY ANGIOPLASTY WITH STENT PLACEMENT  september 9th 2017  . CYSTOSCOPY W/ URETERAL STENT PLACEMENT Left 08/17/2017   Procedure: CYSTOSCOPY WITH RETROGRADE PYELOGRAM/URETERAL STENT PLACEMENT;  Surgeon: Festus Aloe, MD;  Location: [facility];  Service: Urology;  Laterality: Left;  . CYSTOSCOPY W/ URETERAL STENT PLACEMENT Left 09/16/2017   Procedure: CYSTOSCOPY WITH STENT REPLACEMENT;  Surgeon: Bernardo Heater,  Ronda Fairly, MD;  Location: [facility];  Service: Urology;  Laterality: Left;  . CYSTOSCOPY/RETROGRADE/URETEROSCOPY/STONE EXTRACTION WITH BASKET Left 09/16/2017   Procedure: CYSTOSCOPY/RETROGRADE/URETEROSCOPY/STONE EXTRACTION WITH BASKET;  Surgeon: Abbie Sons, MD;  Location: [facility];  Service:  Urology;  Laterality: Left;  . EYE SURGERY Bilateral    cataract extraction  . KYPHOPLASTY N/A 02/03/2018   Procedure: [id number];  Surgeon: Hessie Knows, MD;  Location: [facility];  Service: Orthopedics;  Laterality: N/A;  . MASTECTOMY Right 1982  . PACEMAKER INSERTION Left 07/03/2016   Procedure: INSERTION PACEMAKER;  Surgeon: Isaias Cowman, MD;  Location: [facility];  Service: Cardiovascular;  Laterality: Left;  . SKIN CANCER EXCISION    . TUBAL LIGATION    . VISCERAL ANGIOGRAPHY N/A 11/03/2019   Procedure: VISCERAL ANGIOGRAPHY;  Surgeon: Katha Cabal, MD;  Location: [place] [facility];  Service: Cardiovascular;  Laterality: N/A;    Social History Social History   Tobacco Use  . Smoking status: Former Smoker    Packs/day: 0.50    Types: Cigarettes    Quit date: 09/02/1996    Years since quitting: 23.2  . Smokeless tobacco: Never Used  Substance Use Topics  . Alcohol use: Not Currently    Alcohol/week: 1.0 standard drinks    Types: 1 Glasses of wine per week  . Drug use: No    Family History Family History  Problem Relation Age of Onset  . Prostate cancer Neg Hx   . Kidney cancer Neg Hx   . Breast cancer Neg Hx     Not on File   REVIEW OF SYSTEMS (Negative unless checked)  Constitutional: [] Weight loss  [] Fever  [] Chills Cardiac: [] Chest pain   [] Chest pressure   [] Palpitations   [] Shortness of breath when laying flat   [] Shortness of breath with exertion. Vascular:  [] Pain in legs with walking   [] Pain in legs at rest  [] History of DVT   [] Phlebitis   [] Swelling in legs   [] Varicose veins   [] Non-healing ulcers Pulmonary:   [] Uses home oxygen   [] Productive cough   [] Hemoptysis   [] Wheeze  [] COPD   [] Asthma Neurologic:  [] Dizziness   [] Seizures   [] History of stroke   [] History of TIA  [] Aphasia   [] Vissual changes   [] Weakness or numbness in arm   [] Weakness or numbness in leg Musculoskeletal:   [] Joint swelling   [] Joint pain   [] Low back  pain Hematologic:  [] Easy bruising  [] Easy bleeding   [] Hypercoagulable state   [] Anemic Gastrointestinal:  [] Diarrhea   [] Vomiting  [] Gastroesophageal reflux/heartburn   [] Difficulty swallowing. Genitourinary:  [] Chronic kidney disease   [] Difficult urination  [] Frequent urination   [] Blood in urine Skin:  [] Rashes   [] Ulcers  Psychological:  [] History of anxiety   []  History of major depression.  Physical Examination  There were no vitals filed for this visit. There is no height or weight on file to calculate BMI. Gen: WD/WN, NAD Head: [address]/AT, No temporalis wasting.  Ear/Nose/Throat: Hearing grossly intact, nares w/o erythema or drainage Eyes: PER, EOMI, sclera nonicteric.  Neck: Supple, no large masses.   Pulmonary:  Good air movement, no audible wheezing bilaterally, no use of accessory muscles.  Cardiac: RRR, no JVD Vascular:  Vessel Right Left  Radial Palpable Palpable  Gastrointestinal: Non-distended. No guarding/no peritoneal signs.  Musculoskeletal: M/S 5/5 throughout.  No deformity or atrophy.  Neurologic: CN 2-12 intact. Symmetrical.  Speech is fluent. Motor exam as listed above. Psychiatric: Judgment intact, Mood & affect  appropriate for pt's clinical situation. Dermatologic: No rashes or ulcers noted.  No changes consistent with cellulitis.  CBC Lab Results  Component Value Date   WBC 11.2 (H) 10/04/2018   HGB 11.7 (L) 10/04/2018   HCT 36.6 10/04/2018   MCV 85.1 10/04/2018   PLT 266 10/04/2018    BMET    Component Value Date/Time   NA 137 10/04/2018 2125   NA 140 02/23/2014 1406   K 3.9 10/04/2018 2125   K 3.9 02/23/2014 1406   CL 100 10/04/2018 2125   CL 103 02/23/2014 1406   CO2 24 10/04/2018 2125   CO2 30 02/23/2014 1406   GLUCOSE 216 (H) 10/04/2018 2125   GLUCOSE 70 02/23/2014 1406   BUN 17 11/03/2019 0731   BUN 16 02/23/2014 1406   CREATININE 0.96 11/03/2019 0731   CREATININE 0.75 02/23/2014 1406   CALCIUM 9.0 10/04/2018 2125   CALCIUM 9.3  02/23/2014 1406   GFRNONAA 54 (L) 11/03/2019 0731   GFRNONAA >60 02/23/2014 1406   GFRAA >60 11/03/2019 0731   GFRAA >60 02/23/2014 1406   CrCl cannot be calculated (Patient's most recent lab result is older than the maximum 21 days allowed.).  COAG Lab Results  Component Value Date   INR 0.99 08/18/2018   INR 1.22 08/17/2017   INR 1.01 06/27/2016    Radiology PERIPHERAL VASCULAR CATHETERIZATION  Result Date: 11/03/2019 See op note  DG ESOPHAGUS W SINGLE CM (SOL OR THIN BA)  Result Date: 11/16/2019 CLINICAL DATA:  Dysphagia.  History of esophageal stricture. EXAM: ESOPHOGRAM/BARIUM SWALLOW TECHNIQUE: Single contrast examination was performed using thin barium or water soluble. FLUOROSCOPY TIME:  Fluoroscopy Time:  1 minutes 54 seconds Radiation Exposure Index (if provided by the fluoroscopic device): 31.0 mGy COMPARISON:  Chest x-ray 10/04/2018.  Upper GI 02/02/2018. FINDINGS: Limited exam due to the patient's age. Cardiac pacer noted. Cardiomegaly noted. Mild prominence of the cricopharyngeus fold noted. Cervical esophagus is otherwise unremarkable. No evidence of aspiration. Thoracic esophagus is widely patent. No significant stricture noted. No reflux noted. Standard barium tablet passed normally. IMPRESSION: Mild prominence of the cricopharyngeus fold noted. Cervical esophagus is otherwise unremarkable. No aspiration. Thoracic esophagus is widely patent. No significant stricture. No reflux. Electronically Signed   By: Marcello Moores  Register   On: 11/16/2019 11:47     Assessment/Plan 1. Mesenteric artery stenosis (HCC) Recommend:  The patient is status post successful angiogram with intervention of the mesenteric vessels.  Stenting of the SMA  was performed.  The patient reports that the abdominal pain is improved and the post prandial symptoms are essentially gone.   The patient denies lifestyle limiting changes at this point in time.  No further invasive studies, angiography or surgery  at this time The patient should continue walking and begin a more formal exercise program.  The patient should continue antiplatelet therapy and aggressive treatment of the lipid abnormalities  Smoking cessation was again discussed  Patient should undergo noninvasive studies as ordered. The patient will follow up with me after the studies.   - VAS [place] MESENTERIC; Future  2. Bilateral carotid artery stenosis Recommend:  Given the patient's asymptomatic subcritical stenosis no further invasive testing or surgery at this time.  Continue antiplatelet therapy as prescribed Continue management of CAD, HTN and Hyperlipidemia Healthy heart diet,  encouraged exercise at least 4 times per week Follow up in 12 months with duplex ultrasound and physical exam   3. New onset atrial flutter (HCC) Continue antiarrhythmia medications as already ordered, these  medications have been reviewed and there are no changes at this time.  Continue anticoagulation as ordered by Cardiology Service   4. Coronary artery disease of native artery of native heart with stable angina pectoris (HCC) Continue cardiac and antihypertensive medications as already ordered and reviewed, no changes at this time.  Continue statin as ordered and reviewed, no changes at this time  Nitrates PRN for chest pain   5. Hyperlipidemia associated with type 2 diabetes mellitus ([place]) Continue statin as ordered and reviewed, no changes at this time   6. Pure hypercholesterolemia Continue statin as ordered and reviewed, no changes at this time    Hortencia Pilar, MD  11/29/2019 8:37 AM

## 2019-11-30 ENCOUNTER — Encounter (INDEPENDENT_AMBULATORY_CARE_PROVIDER_SITE_OTHER): Payer: Self-pay | Admitting: Vascular Surgery

## 2019-11-30 DIAGNOSIS — K551 Chronic vascular disorders of intestine: Secondary | ICD-10-CM | POA: Insufficient documentation

## 2020-04-14 ENCOUNTER — Other Ambulatory Visit: Payer: Self-pay | Admitting: Internal Medicine

## 2020-04-14 DIAGNOSIS — M7989 Other specified soft tissue disorders: Secondary | ICD-10-CM

## 2020-04-21 ENCOUNTER — Other Ambulatory Visit: Payer: Self-pay | Admitting: Internal Medicine

## 2020-04-24 ENCOUNTER — Other Ambulatory Visit: Payer: Self-pay | Admitting: Gastroenterology

## 2020-04-24 ENCOUNTER — Other Ambulatory Visit: Payer: Self-pay

## 2020-04-24 ENCOUNTER — Ambulatory Visit
Admission: RE | Admit: 2020-04-24 | Discharge: 2020-04-24 | Disposition: A | Discharge status: Home / Self Care | Payer: Medicare Other | Source: Ambulatory Visit | Attending: Internal Medicine | Admitting: Internal Medicine

## 2020-04-24 DIAGNOSIS — R1314 Dysphagia, pharyngoesophageal phase: Secondary | ICD-10-CM

## 2020-04-24 DIAGNOSIS — M7989 Other specified soft tissue disorders: Secondary | ICD-10-CM | POA: Diagnosis not present

## 2020-04-24 DIAGNOSIS — R1319 Other dysphagia: Secondary | ICD-10-CM

## 2020-05-15 ENCOUNTER — Ambulatory Visit: Admission: RE | Admit: 2020-05-15 | Payer: Medicare Other | Source: Ambulatory Visit

## 2020-05-31 ENCOUNTER — Other Ambulatory Visit: Payer: Medicare Other

## 2020-06-01 ENCOUNTER — Encounter (INDEPENDENT_AMBULATORY_CARE_PROVIDER_SITE_OTHER): Payer: Medicare Other

## 2020-06-01 ENCOUNTER — Ambulatory Visit (INDEPENDENT_AMBULATORY_CARE_PROVIDER_SITE_OTHER): Payer: Medicare Other | Admitting: Vascular Surgery

## 2020-06-05 ENCOUNTER — Other Ambulatory Visit: Payer: Self-pay | Admitting: Internal Medicine

## 2020-06-05 DIAGNOSIS — Z1231 Encounter for screening mammogram for malignant neoplasm of breast: Secondary | ICD-10-CM

## 2020-06-21 ENCOUNTER — Ambulatory Visit (INDEPENDENT_AMBULATORY_CARE_PROVIDER_SITE_OTHER): Payer: Medicare Other | Admitting: Nurse Practitioner

## 2020-06-21 ENCOUNTER — Encounter (INDEPENDENT_AMBULATORY_CARE_PROVIDER_SITE_OTHER): Payer: Medicare Other

## 2020-06-26 ENCOUNTER — Encounter (INDEPENDENT_AMBULATORY_CARE_PROVIDER_SITE_OTHER): Payer: Self-pay | Admitting: Vascular Surgery

## 2020-06-26 ENCOUNTER — Ambulatory Visit (INDEPENDENT_AMBULATORY_CARE_PROVIDER_SITE_OTHER): Payer: Medicare Other

## 2020-06-26 ENCOUNTER — Other Ambulatory Visit: Payer: Self-pay

## 2020-06-26 ENCOUNTER — Ambulatory Visit (INDEPENDENT_AMBULATORY_CARE_PROVIDER_SITE_OTHER): Payer: Medicare Other | Admitting: Vascular Surgery

## 2020-06-26 VITALS — BP 146/83 | HR 82 | Ht 62.0 in | Wt 152.0 lb

## 2020-06-26 DIAGNOSIS — I890 Lymphedema, not elsewhere classified: Secondary | ICD-10-CM | POA: Diagnosis not present

## 2020-06-26 DIAGNOSIS — K551 Chronic vascular disorders of intestine: Secondary | ICD-10-CM

## 2020-06-26 DIAGNOSIS — I6523 Occlusion and stenosis of bilateral carotid arteries: Secondary | ICD-10-CM

## 2020-06-26 DIAGNOSIS — I25118 Atherosclerotic heart disease of native coronary artery with other forms of angina pectoris: Secondary | ICD-10-CM

## 2020-06-26 DIAGNOSIS — R0989 Other specified symptoms and signs involving the circulatory and respiratory systems: Secondary | ICD-10-CM

## 2020-06-26 NOTE — Progress Notes (Signed)
MRN : [id number]  Cassandra [name] is a 86 y.o. ([birth year]) female who presents with chief complaint of No chief complaint on file. [name]  History of Present Illness:   The patient returns to the office for follow-up regarding chronic mesenteric ischemia associated with stenosis of the SMA.  Angiogram on 11/03/2019: She is s/p balloon expandable stent placement to the superior mesenteric artery using a6 mmdiameter x53mm length lifestreamballoon expandable stentand then a 7 mm x 16 mm length lifestream in series    The patient denies abdominal pain or postprandial symptoms.  The patient denies weight loss as well as nausea.  The patient does not substantiate food fear, particular foods do not seem to aggravate or alleviate the symptoms.  The patient denies bloody bowel movements or diarrhea.  The patient has a history of colonoscopy which was not diagnostic.  No history of peptic ulcer disease.   The patient denies amaurosis fugax or recent TIA symptoms. There are no recent neurological changes noted.  She also notes worsening of the discoloration of her legs.  She has been using her pump and her swelling has been fairly well controlled.  She has not been wearing compression.  The patient denies claudication symptoms or rest pain symptoms. The patient denies history of DVT, PE or superficial thrombophlebitis. The patient denies recent episodes of angina   Duplex ultrasound of the superior mesenteric artery shows the stent is patent and the velocities are remain improved compared to the preintervention study.  Peak systolic velocities on today's study demonstrate 400 cm/s this is similar to the study dated November 03, 2019.   No outpatient medications have been marked as taking for the 06/26/20 encounter (Appointment) with Delana Meyer, Dolores Lory, MD.    Past Medical History:  Diagnosis Date  . Acquired hypothyroidism 01/06/2014  . Anxiety   . Arthritis   . B12 deficiency 02/15/2014    . Benign essential hypertension 01/06/2014  . Breast cancer [facility]) 1982   Right breast cancer - chemotherapy  . CAD (coronary artery disease), native coronary artery 01/06/2014  . Carotid artery calcification   . Chronic airway obstruction, not elsewhere classified 01/06/2014  . Depression   . Diabetes mellitus without complication ([place])   . History of kidney stones 2019   left ureteral stone  . Incontinence of urine   . Myocardial infarction ([place]) 1982   small infarct  . Neuromuscular disorder (HCC)    restless legs  . Pacemaker   . Presence of permanent cardiac pacemaker 07/2016  . Pure hypercholesterolemia 01/06/2014  . Renal insufficiency   . Skin cancer   . Sleep apnea    uses cpap    Past Surgical History:  Procedure Laterality Date  . APPENDECTOMY    . AUGMENTATION MAMMAPLASTY Bilateral 1982/redo in 2014   prior mastectomy  . CARDIAC CATHETERIZATION N/A 04/23/2016   Procedure: Left Heart Cath and Coronary Angiography;  Surgeon: Corey Skains, MD;  Location: [place] [facility];  Service: Cardiovascular;  Laterality: N/A;  . CARDIAC CATHETERIZATION Left 04/23/2016   Procedure: Coronary Stent Intervention;  Surgeon: Yolonda Kida, MD;  Location: [place] [facility];  Service: Cardiovascular;  Laterality: Left;  . CAROTID ARTERY ANGIOPLASTY Left 2010   had stent inserted and removed d/t infection. artery from leg inserted in left carotid  . CAROTID ENDARTERECTOMY Left 2010  . CHOLECYSTECTOMY    . CORONARY ANGIOPLASTY WITH STENT PLACEMENT  september 9th 2017  . CYSTOSCOPY W/ URETERAL STENT PLACEMENT Left  08/17/2017   Procedure: CYSTOSCOPY WITH RETROGRADE PYELOGRAM/URETERAL STENT PLACEMENT;  Surgeon: Festus Aloe, MD;  Location: [facility];  Service: Urology;  Laterality: Left;  . CYSTOSCOPY W/ URETERAL STENT PLACEMENT Left 09/16/2017   Procedure: CYSTOSCOPY WITH STENT REPLACEMENT;  Surgeon: Abbie Sons, MD;  Location: [facility];  Service: Urology;  Laterality: Left;   . CYSTOSCOPY/RETROGRADE/URETEROSCOPY/STONE EXTRACTION WITH BASKET Left 09/16/2017   Procedure: CYSTOSCOPY/RETROGRADE/URETEROSCOPY/STONE EXTRACTION WITH BASKET;  Surgeon: Abbie Sons, MD;  Location: [facility];  Service: Urology;  Laterality: Left;  . EYE SURGERY Bilateral    cataract extraction  . KYPHOPLASTY N/A 02/03/2018   Procedure: [id number];  Surgeon: Hessie Knows, MD;  Location: [facility];  Service: Orthopedics;  Laterality: N/A;  . MASTECTOMY Right 1982  . PACEMAKER INSERTION Left 07/03/2016   Procedure: INSERTION PACEMAKER;  Surgeon: Isaias Cowman, MD;  Location: [facility];  Service: Cardiovascular;  Laterality: Left;  . SKIN CANCER EXCISION    . TUBAL LIGATION    . VISCERAL ANGIOGRAPHY N/A 11/03/2019   Procedure: VISCERAL ANGIOGRAPHY;  Surgeon: Katha Cabal, MD;  Location: [place] [facility];  Service: Cardiovascular;  Laterality: N/A;    Social History Social History   Tobacco Use  . Smoking status: Former Smoker    Packs/day: 0.50    Types: Cigarettes    Quit date: 09/02/1996    Years since quitting: 23.8  . Smokeless tobacco: Never Used  Vaping Use  . Vaping Use: Never used  Substance Use Topics  . Alcohol use: Not Currently    Alcohol/week: 1.0 standard drink    Types: 1 Glasses of wine per week  . Drug use: No    Family History Family History  Problem Relation Age of Onset  . Prostate cancer Neg Hx   . Kidney cancer Neg Hx   . Breast cancer Neg Hx     Not on File   REVIEW OF SYSTEMS (Negative unless checked)  Constitutional: [] Weight loss  [] Fever  [] Chills Cardiac: [] Chest pain   [] Chest pressure   [] Palpitations   [] Shortness of breath when laying flat   [] Shortness of breath with exertion. Vascular:  [] Pain in legs with walking   [] Pain in legs at rest  [] History of DVT   [] Phlebitis   [x] Swelling in legs   [x] Varicose veins   [] Non-healing ulcers Pulmonary:   [] Uses home oxygen   [] Productive cough   [] Hemoptysis   [] Wheeze  [] COPD    [] Asthma Neurologic:  [] Dizziness   [] Seizures   [] History of stroke   [] History of TIA  [] Aphasia   [] Vissual changes   [] Weakness or numbness in arm   [] Weakness or numbness in leg Musculoskeletal:   [] Joint swelling   [] Joint pain   [] Low back pain Hematologic:  [] Easy bruising  [] Easy bleeding   [] Hypercoagulable state   [] Anemic Gastrointestinal:  [] Diarrhea   [] Vomiting  [] Gastroesophageal reflux/heartburn   [] Difficulty swallowing. Genitourinary:  [] Chronic kidney disease   [] Difficult urination  [] Frequent urination   [] Blood in urine Skin:  [x] Rashes   [] Ulcers  Psychological:  [] History of anxiety   []  History of major depression.  Physical Examination  There were no vitals filed for this visit. There is no height or weight on file to calculate BMI. Gen: WD/WN, NAD Head: [place]/AT, No temporalis wasting.  Ear/Nose/Throat: Hearing grossly intact, nares w/o erythema or drainage Eyes: PER, EOMI, sclera nonicteric.  Neck: Supple, no large masses.   Pulmonary:  Good air movement, no audible wheezing bilaterally, no use of accessory  muscles.  Cardiac: RRR, no JVD Vascular: Soft bilateral carotid bruits; 1+ pitting edema bilaterally moderate venous stasis changes with diffuse discoloration Vessel Right Left  Radial Palpable Palpable  Gastrointestinal: Non-distended. No guarding/no peritoneal signs.  Musculoskeletal: M/S 5/5 throughout.  No deformity or atrophy.  Neurologic: CN 2-12 intact. Symmetrical.  Speech is fluent. Motor exam as listed above. Psychiatric: Judgment intact, Mood & affect appropriate for pt's clinical situation. Dermatologic: Moderate rashes no ulcers noted.  No changes consistent with cellulitis. Lymph : + lichenification and skin changes of chronic lymphedema.  CBC Lab Results  Component Value Date   WBC 11.2 (H) 10/04/2018   HGB 11.7 (L) 10/04/2018   HCT 36.6 10/04/2018   MCV 85.1 10/04/2018   PLT 266 10/04/2018    BMET    Component Value Date/Time    NA 137 10/04/2018 2125   NA 140 02/23/2014 1406   K 3.9 10/04/2018 2125   K 3.9 02/23/2014 1406   CL 100 10/04/2018 2125   CL 103 02/23/2014 1406   CO2 24 10/04/2018 2125   CO2 30 02/23/2014 1406   GLUCOSE 216 (H) 10/04/2018 2125   GLUCOSE 70 02/23/2014 1406   BUN 17 11/03/2019 0731   BUN 16 02/23/2014 1406   CREATININE 0.96 11/03/2019 0731   CREATININE 0.75 02/23/2014 1406   CALCIUM 9.0 10/04/2018 2125   CALCIUM 9.3 02/23/2014 1406   GFRNONAA 54 (L) 11/03/2019 0731   GFRNONAA >60 02/23/2014 1406   GFRAA >60 11/03/2019 0731   GFRAA >60 02/23/2014 1406   CrCl cannot be calculated (Patient's most recent lab result is older than the maximum 21 days allowed.).  COAG Lab Results  Component Value Date   INR 0.99 08/18/2018   INR 1.22 08/17/2017   INR 1.01 06/27/2016    Radiology No results found.   Assessment/Plan 1. Mesenteric artery stenosis (HCC) Recommend:  The patient is status post successful angiogram with intervention of the mesenteric vessels.  Stenting of the SMA  was performed.  The patient reports that the abdominal pain is improved and the post prandial symptoms are essentially gone.   The patient denies lifestyle limiting changes at this point in time.  No further invasive studies, angiography or surgery at this time The patient should continue walking and begin a more formal exercise program.  The patient should continue antiplatelet therapy and aggressive treatment of the lipid abnormalities  Patient should undergo noninvasive studies as ordered. The patient will follow up with me after the studies.   - VAS [place] MESENTERIC; Future  2. Bilateral carotid artery stenosis Recommend:  Given the patient's asymptomatic subcritical stenosis no further invasive testing or surgery at this time.  Continue antiplatelet therapy as prescribed Continue management of CAD, HTN and Hyperlipidemia Healthy heart diet,  encouraged exercise at least 4 times per  week Follow up every 12 months with duplex ultrasound and physical exam   - VAS US CAROTID; Future  3. Lymphedema  No surgery or intervention at this point in time.    I have reviewed my discussion with the patient regarding lymphedema and why it  causes symptoms.  Patient will wear [place] moderate graduated compression stockings on a daily basis a prescription was given. The patient is reminded to put the stockings on first thing in the morning and removing them in the evening. The patient is instructed specifically not to sleep in the stockings.   She will begin moisturizing 1-2 times a day with see knives extra relief lotion.  In  addition, behavioral modification throughout the day will be continued.  This will include frequent elevation (such as in a recliner), use of over the counter pain medications as needed and exercise such as walking.  I have reviewed systemic causes for chronic edema such as liver, kidney and cardiac etiologies and there does not appear to be any significant changes in these organ systems over the past year.  The patient is under the impression that these organ systems are all stable and unchanged.    The patient will continue aggressive use of the  lymph pump.  This will continue to improve the edema control and prevent sequela such as ulcers and infections.   The patient will follow-up with me on an annual basis.    4. Coronary artery disease of native artery of native heart with stable angina pectoris (HCC) Continue cardiac and antihypertensive medications as already ordered and reviewed, no changes at this time.  Continue statin as ordered and reviewed, no changes at this time  Nitrates PRN for chest pain   5. Labile hypertension Continue antihypertensive medications as already ordered, these medications have been reviewed and there are no changes at this time.     Hortencia Pilar, MD  06/26/2020 8:22 AM

## 2020-07-03 NOTE — Progress Notes (Deleted)
07/04/2020 9:09 PM   ALIANA KREISCHER March 22, 1933 [id number]  Referring provider: Idelle Crouch, MD [facility] [place],  [place] [address]  No chief complaint on file.   HPI: Cassandra [name] is a 86 y.o. female with nephrolithiasis, incontinence and urinary retention who presents today for an appointment.   Nephrolithiasis Episode of septic left ureteral stone - s/p urs.  RUS in 2020 was negative for nephrolithiasis.  Incontinence Seen by Dr. Matilde Sprang in 2017 and had PT.  Urinary retention ***    PMH: Past Medical History:  Diagnosis Date   Acquired hypothyroidism 01/06/2014   Anxiety    Arthritis    B12 deficiency 02/15/2014   Benign essential hypertension 01/06/2014   Breast cancer ([place]) 1982   Right breast cancer - chemotherapy   CAD (coronary artery disease), native coronary artery 01/06/2014   Carotid artery calcification    Chronic airway obstruction, not elsewhere classified 01/06/2014   Depression    Diabetes mellitus without complication ([place])    History of kidney stones 2019   left ureteral stone   Incontinence of urine    Myocardial infarction [facility]) 1982   small infarct   Neuromuscular disorder (HCC)    restless legs   Pacemaker    Presence of permanent cardiac pacemaker 07/2016   Pure hypercholesterolemia 01/06/2014   Renal insufficiency    Skin cancer    Sleep apnea    uses cpap    Surgical History: Past Surgical History:  Procedure Laterality Date   APPENDECTOMY     AUGMENTATION MAMMAPLASTY Bilateral 1982/redo in 2014   prior mastectomy   CARDIAC CATHETERIZATION N/A 04/23/2016   Procedure: Left Heart Cath and Coronary Angiography;  Surgeon: Corey Skains, MD;  Location: [place] [facility];  Service: Cardiovascular;  Laterality: N/A;   CARDIAC CATHETERIZATION Left 04/23/2016   Procedure: Coronary Stent Intervention;  Surgeon: Yolonda Kida, MD;  Location: [place] [facility];   Service: Cardiovascular;  Laterality: Left;   CAROTID ARTERY ANGIOPLASTY Left 2010   had stent inserted and removed d/t infection. artery from leg inserted in left carotid   CAROTID ENDARTERECTOMY Left 2010   CHOLECYSTECTOMY     CORONARY ANGIOPLASTY WITH STENT PLACEMENT  september 9th 2017   CYSTOSCOPY W/ URETERAL STENT PLACEMENT Left 08/17/2017   Procedure: CYSTOSCOPY WITH RETROGRADE PYELOGRAM/URETERAL STENT PLACEMENT;  Surgeon: Festus Aloe, MD;  Location: [facility];  Service: Urology;  Laterality: Left;   CYSTOSCOPY W/ URETERAL STENT PLACEMENT Left 09/16/2017   Procedure: CYSTOSCOPY WITH STENT REPLACEMENT;  Surgeon: Abbie Sons, MD;  Location: [facility];  Service: Urology;  Laterality: Left;   CYSTOSCOPY/RETROGRADE/URETEROSCOPY/STONE EXTRACTION WITH BASKET Left 09/16/2017   Procedure: CYSTOSCOPY/RETROGRADE/URETEROSCOPY/STONE EXTRACTION WITH BASKET;  Surgeon: Abbie Sons, MD;  Location: [facility];  Service: Urology;  Laterality: Left;   EYE SURGERY Bilateral    cataract extraction   KYPHOPLASTY N/A 02/03/2018   Procedure: [id number];  Surgeon: Hessie Knows, MD;  Location: [facility];  Service: Orthopedics;  Laterality: N/A;   MASTECTOMY Right 1982   PACEMAKER INSERTION Left 07/03/2016   Procedure: INSERTION PACEMAKER;  Surgeon: Isaias Cowman, MD;  Location: [facility];  Service: Cardiovascular;  Laterality: Left;   SKIN CANCER EXCISION     TUBAL LIGATION     VISCERAL ANGIOGRAPHY N/A 11/03/2019   Procedure: VISCERAL ANGIOGRAPHY;  Surgeon: Katha Cabal, MD;  Location: [place] [facility];  Service: Cardiovascular;  Laterality: N/A;    Home Medications:  Allergies  as of 07/04/2020   Not on File     Medication List       Accurate as of July 03, 2020  9:09 PM. If you have any questions, ask your nurse or doctor.        ammonium lactate 12 % lotion Commonly known as: AmLactin Apply 1 application topically as needed for dry skin.   aspirin EC  81 MG tablet Take 81 mg by mouth daily.   buPROPion 150 MG 24 hr tablet Commonly known as: WELLBUTRIN XL Take 150 mg by mouth daily.   carbidopa-levodopa 25-100 MG tablet Commonly known as: SINEMET IR Take 1 tablet by mouth at bedtime.   cefdinir 300 MG capsule Commonly known as: OMNICEF Take 1 capsule (300 mg total) by mouth 2 (two) times daily.   clopidogrel 75 MG tablet Commonly known as: Plavix Take 1 tablet (75 mg total) by mouth daily.   FreeStyle Libre 14 Day Reader Kerrin Mo Use 1 each as needed .  Use to test blood sugar   FreeStyle Libre 14 Day Sensor Misc Use 1 each every 14 (fourteen) days   furosemide 20 MG tablet Commonly known as: LASIX Take by mouth.   HumaLOG KwikPen 100 UNIT/ML KwikPen Generic drug: insulin lispro Inject 10 Units into the skin 3 (three) times daily. *Use as directed per sliding scale*   insulin glargine 100 UNIT/ML injection Commonly known as: LANTUS Inject 0.5 mLs (50 Units total) into the skin at bedtime. What changed: how much to take   levothyroxine 112 MCG tablet Commonly known as: SYNTHROID Take 1 tablet (112 mcg total) by mouth daily before breakfast.   magnesium oxide 400 MG tablet Commonly known as: MAG-OX Take 400 mg by mouth at bedtime.   metFORMIN 500 MG 24 hr tablet Commonly known as: GLUCOPHAGE-XR Take 2 tablets (1,000 mg total) by mouth 2 (two) times daily. Please start this on 8/24   metoprolol succinate 25 MG 24 hr tablet Commonly known as: TOPROL-XL Take 1 tablet (25 mg total) by mouth daily.   multivitamin with minerals Tabs tablet Take 1 tablet by mouth daily.   nitroGLYCERIN 0.4 MG SL tablet Commonly known as: NITROSTAT Place 1 tablet (0.4 mg total) under the tongue every 5 (five) minutes as needed for chest pain.   pantoprazole 40 MG tablet Commonly known as: PROTONIX TAKE 1 TABLET BY MOUTH DAILY TAKE 15-20 MINS BEFORE A MEAL   PARoxetine 10 MG tablet Commonly known as: PAXIL Take 1 tablet by mouth  daily.   pioglitazone 30 MG tablet Commonly known as: ACTOS Take by mouth.   pramipexole 0.5 MG tablet Commonly known as: MIRAPEX Take 0.5 mg by mouth at bedtime.   rOPINIRole 0.5 MG tablet Commonly known as: REQUIP Take 0.5 mg by mouth in the morning and at bedtime.   rosuvastatin 10 MG tablet Commonly known as: CRESTOR Take 2 tablets (20 mg total) by mouth at bedtime. What changed: how much to take   solifenacin 10 MG tablet Commonly known as: VESICARE Take by mouth.   triamterene-hydrochlorothiazide 37.5-25 MG capsule Commonly known as: DYAZIDE Take 1 capsule by mouth daily.   Vitamin D3 50 MCG (2000 UT) Tabs Take 2,000 Units by mouth daily.       Allergies: Not on File  Family History: Family History  Problem Relation Age of Onset   Prostate cancer Neg Hx    Kidney cancer Neg Hx    Breast cancer Neg Hx     Social History:  reports  that she quit smoking about 23 years ago. Her smoking use included cigarettes. She smoked 0.50 packs per day. She has never used smokeless tobacco. She reports previous alcohol use of about 1.0 standard drink of alcohol per week. She reports that she does not use drugs.  ROS: Pertinent ROS in HPI  Physical Exam: LMP  (LMP Unknown) Comment: age 64  Constitutional:  Well nourished. Alert and oriented, No acute distress. HEENT: [place] AT, moist mucus membranes.  Trachea midline, no masses. Cardiovascular: No clubbing, cyanosis, or edema. Respiratory: Normal respiratory effort, no increased work of breathing. GI: Abdomen is soft, non tender, non distended, no abdominal masses. Liver and spleen not palpable.  No hernias appreciated.  Stool sample for occult testing is not indicated.   GU: No CVA tenderness.  No bladder fullness or masses.  *** external genitalia, *** pubic hair distribution, no lesions.  Normal urethral meatus, no lesions, no prolapse, no discharge.   No urethral masses, tenderness and/or tenderness. No bladder fullness,  tenderness or masses. *** vagina mucosa, *** estrogen effect, no discharge, no lesions, *** pelvic support, *** cystocele and *** rectocele noted.  No cervical motion tenderness.  Uterus is freely mobile and non-fixed.  No adnexal/parametria masses or tenderness noted.  Anus and perineum are without rashes or lesions.   ***  Skin: No rashes, bruises or suspicious lesions. Lymph: No cervical or inguinal adenopathy. Neurologic: Grossly intact, no focal deficits, moving all 4 extremities. Psychiatric: Normal mood and affect.   Laboratory Data: Lab Results  Component Value Date   WBC 11.2 (H) 10/04/2018   HGB 11.7 (L) 10/04/2018   HCT 36.6 10/04/2018   MCV 85.1 10/04/2018   PLT 266 10/04/2018    Lab Results  Component Value Date   CREATININE 0.96 11/03/2019    Lab Results  Component Value Date   HGBA1C 7.4 (H) 06/07/2016    Lab Results  Component Value Date   TSH 1.196 11/08/2017       Component Value Date/Time   CHOL 210 (H) 08/19/2018 0349   HDL 42 08/19/2018 0349   CHOLHDL 5.0 08/19/2018 0349   VLDL 41 (H) 08/19/2018 0349   LDLCALC 127 (H) 08/19/2018 0349    Lab Results  Component Value Date   AST 17 10/04/2018   Lab Results  Component Value Date   ALT 11 10/04/2018    Urinalysis ***  I have reviewed the labs.   Pertinent Imaging: ***  Assessment & Plan:  ***  1. Urinary retention ***  No follow-ups on file.  These notes generated with voice recognition software. I apologize for typographical errors.  Zara Council, PA-C  [facility] [address]  [place], [place] [address] [phone]

## 2020-07-04 ENCOUNTER — Ambulatory Visit: Payer: Medicare Other | Admitting: Urology

## 2020-07-04 DIAGNOSIS — R339 Retention of urine, unspecified: Secondary | ICD-10-CM

## 2020-07-11 ENCOUNTER — Ambulatory Visit: Payer: Medicare Other | Admitting: Urology

## 2020-07-11 NOTE — Progress Notes (Signed)
Provider change

## 2020-07-12 ENCOUNTER — Ambulatory Visit (INDEPENDENT_AMBULATORY_CARE_PROVIDER_SITE_OTHER): Payer: Medicare Other | Admitting: Urology

## 2020-07-12 ENCOUNTER — Encounter: Payer: Self-pay | Admitting: Urology

## 2020-07-12 ENCOUNTER — Other Ambulatory Visit: Payer: Self-pay

## 2020-07-12 VITALS — BP 144/80 | HR 81 | Ht 62.0 in | Wt 152.0 lb

## 2020-07-12 DIAGNOSIS — Z87442 Personal history of urinary calculi: Secondary | ICD-10-CM

## 2020-07-12 DIAGNOSIS — R339 Retention of urine, unspecified: Secondary | ICD-10-CM

## 2020-07-12 DIAGNOSIS — N3941 Urge incontinence: Secondary | ICD-10-CM

## 2020-07-12 DIAGNOSIS — N200 Calculus of kidney: Secondary | ICD-10-CM

## 2020-07-12 LAB — BLADDER SCAN AMB NON-IMAGING

## 2020-07-12 NOTE — Progress Notes (Signed)
07/12/2020 10:09 AM   Cassandra [name] 1933-03-16 [id number]  Referring provider: Idelle Crouch, MD [facility] [place],  [place] [address]  Chief Complaint  Patient presents with  . Urinary Retention    Urologic history: 1. Stone disease -Left ureteroscopic stone removal 09/2017 -Stent placement prior due to sepsis  2. Urinary incontinence -PT referral Dr. Matilde Sprang 2017   HPI: 86 y.o. female presents for evaluation of worsening urinary tract symptoms.   Complains urinary frequency, urgency with urge incontinence  Symptoms worse since starting Lasix for edema  Denies dysuria, gross hematuria  Denies flank, abdominal or pelvic pain  Has been on solifenacin in the past without improvement   PMH: Past Medical History:  Diagnosis Date  . Acquired hypothyroidism 01/06/2014  . Anxiety   . Arthritis   . B12 deficiency 02/15/2014  . Benign essential hypertension 01/06/2014  . Breast cancer [facility]) 1982   Right breast cancer - chemotherapy  . CAD (coronary artery disease), native coronary artery 01/06/2014  . Carotid artery calcification   . Chronic airway obstruction, not elsewhere classified 01/06/2014  . Depression   . Diabetes mellitus without complication ([place])   . History of kidney stones 2019   left ureteral stone  . Incontinence of urine   . Myocardial infarction ([place]) 1982   small infarct  . Neuromuscular disorder (HCC)    restless legs  . Pacemaker   . Presence of permanent cardiac pacemaker 07/2016  . Pure hypercholesterolemia 01/06/2014  . Renal insufficiency   . Skin cancer   . Sleep apnea    uses cpap    Surgical History: Past Surgical History:  Procedure Laterality Date  . APPENDECTOMY    . AUGMENTATION MAMMAPLASTY Bilateral 1982/redo in 2014   prior mastectomy  . CARDIAC CATHETERIZATION N/A 04/23/2016   Procedure: Left Heart Cath and Coronary Angiography;  Surgeon: Corey Skains, MD;  Location: [place]  [facility];  Service: Cardiovascular;  Laterality: N/A;  . CARDIAC CATHETERIZATION Left 04/23/2016   Procedure: Coronary Stent Intervention;  Surgeon: Yolonda Kida, MD;  Location: [place] [facility];  Service: Cardiovascular;  Laterality: Left;  . CAROTID ARTERY ANGIOPLASTY Left 2010   had stent inserted and removed d/t infection. artery from leg inserted in left carotid  . CAROTID ENDARTERECTOMY Left 2010  . CHOLECYSTECTOMY    . CORONARY ANGIOPLASTY WITH STENT PLACEMENT  september 9th 2017  . CYSTOSCOPY W/ URETERAL STENT PLACEMENT Left 08/17/2017   Procedure: CYSTOSCOPY WITH RETROGRADE PYELOGRAM/URETERAL STENT PLACEMENT;  Surgeon: Festus Aloe, MD;  Location: [facility];  Service: Urology;  Laterality: Left;  . CYSTOSCOPY W/ URETERAL STENT PLACEMENT Left 09/16/2017   Procedure: CYSTOSCOPY WITH STENT REPLACEMENT;  Surgeon: Abbie Sons, MD;  Location: [facility];  Service: Urology;  Laterality: Left;  . CYSTOSCOPY/RETROGRADE/URETEROSCOPY/STONE EXTRACTION WITH BASKET Left 09/16/2017   Procedure: CYSTOSCOPY/RETROGRADE/URETEROSCOPY/STONE EXTRACTION WITH BASKET;  Surgeon: Abbie Sons, MD;  Location: [facility];  Service: Urology;  Laterality: Left;  . EYE SURGERY Bilateral    cataract extraction  . KYPHOPLASTY N/A 02/03/2018   Procedure: [id number];  Surgeon: Hessie Knows, MD;  Location: [facility];  Service: Orthopedics;  Laterality: N/A;  . MASTECTOMY Right 1982  . PACEMAKER INSERTION Left 07/03/2016   Procedure: INSERTION PACEMAKER;  Surgeon: Isaias Cowman, MD;  Location: [facility];  Service: Cardiovascular;  Laterality: Left;  . SKIN CANCER EXCISION    . TUBAL LIGATION    . VISCERAL ANGIOGRAPHY N/A 11/03/2019   Procedure: VISCERAL  ANGIOGRAPHY;  Surgeon: Katha Cabal, MD;  Location: [place] [facility];  Service: Cardiovascular;  Laterality: N/A;    Home Medications:  Allergies as of 07/12/2020   Not on File     Medication List       Accurate as of July 12, 2020 10:09 AM. If you have any questions, ask your nurse or doctor.        ammonium lactate 12 % lotion Commonly known as: AmLactin Apply 1 application topically as needed for dry skin.   aspirin EC 81 MG tablet Take 81 mg by mouth daily.   buPROPion 150 MG 24 hr tablet Commonly known as: WELLBUTRIN XL Take 150 mg by mouth daily.   carbidopa-levodopa 25-100 MG tablet Commonly known as: SINEMET IR Take 1 tablet by mouth at bedtime.   cefdinir 300 MG capsule Commonly known as: OMNICEF Take 1 capsule (300 mg total) by mouth 2 (two) times daily.   clopidogrel 75 MG tablet Commonly known as: Plavix Take 1 tablet (75 mg total) by mouth daily.   FreeStyle Libre 14 Day Reader Kerrin Mo Use 1 each as needed .  Use to test blood sugar   FreeStyle Libre 14 Day Sensor Misc Use 1 each every 14 (fourteen) days   furosemide 20 MG tablet Commonly known as: LASIX Take by mouth.   HumaLOG KwikPen 100 UNIT/ML KwikPen Generic drug: insulin lispro Inject 10 Units into the skin 3 (three) times daily. *Use as directed per sliding scale*   insulin glargine 100 UNIT/ML injection Commonly known as: LANTUS Inject 0.5 mLs (50 Units total) into the skin at bedtime. What changed: how much to take   levothyroxine 112 MCG tablet Commonly known as: SYNTHROID Take 1 tablet (112 mcg total) by mouth daily before breakfast.   magnesium oxide 400 MG tablet Commonly known as: MAG-OX Take 400 mg by mouth at bedtime.   metFORMIN 500 MG 24 hr tablet Commonly known as: GLUCOPHAGE-XR Take 2 tablets (1,000 mg total) by mouth 2 (two) times daily. Please start this on 8/24   metoprolol succinate 25 MG 24 hr tablet Commonly known as: TOPROL-XL Take 1 tablet (25 mg total) by mouth daily.   multivitamin with minerals Tabs tablet Take 1 tablet by mouth daily.   nitroGLYCERIN 0.4 MG SL tablet Commonly known as: NITROSTAT Place 1 tablet (0.4 mg total) under the tongue every 5 (five) minutes as needed for  chest pain.   pantoprazole 40 MG tablet Commonly known as: PROTONIX TAKE 1 TABLET BY MOUTH DAILY TAKE 15-20 MINS BEFORE A MEAL   PARoxetine 10 MG tablet Commonly known as: PAXIL Take 1 tablet by mouth daily.   pioglitazone 30 MG tablet Commonly known as: ACTOS Take by mouth.   pramipexole 0.5 MG tablet Commonly known as: MIRAPEX Take 0.5 mg by mouth at bedtime.   rOPINIRole 0.5 MG tablet Commonly known as: REQUIP Take 0.5 mg by mouth in the morning and at bedtime.   rosuvastatin 10 MG tablet Commonly known as: CRESTOR Take 2 tablets (20 mg total) by mouth at bedtime. What changed: how much to take   solifenacin 10 MG tablet Commonly known as: VESICARE Take by mouth.   triamterene-hydrochlorothiazide 37.5-25 MG capsule Commonly known as: DYAZIDE Take 1 capsule by mouth daily.   Vitamin D3 50 MCG (2000 UT) Tabs Take 2,000 Units by mouth daily.       Allergies: Not on File  Family History: Family History  Problem Relation Age of Onset  . Prostate  cancer Neg Hx   . Kidney cancer Neg Hx   . Breast cancer Neg Hx     Social History:  reports that she quit smoking about 23 years ago. Her smoking use included cigarettes. She smoked 0.50 packs per day. She has never used smokeless tobacco. She reports previous alcohol use of about 1.0 standard drink of alcohol per week. She reports that she does not use drugs.   Physical Exam: BP (!) 144/80 (BP Location: Left Arm, Patient Position: Sitting, Cuff Size: Normal)   Pulse 81   Ht 5\' 2"  (1.575 m)   Wt 152 lb (68.9 kg)   LMP  (LMP Unknown) Comment: age 31  BMI 27.80 kg/m   Constitutional:  Alert and oriented, No acute distress. HEENT: [place] AT, moist mucus membranes.  Trachea midline, no masses. Cardiovascular: No clubbing, cyanosis, or edema. Respiratory: Normal respiratory effort, no increased work of breathing. GI: Abdomen is soft, nontender, nondistended, no abdominal masses Skin: No rashes, bruises or suspicious  lesions. Neurologic: Grossly intact, no focal deficits, moving all 4 extremities. Psychiatric: Normal mood and affect.    Assessment & Plan:    1. Urge incontinence  Bladder scan PVR 0 mL  She was interested in medical management and given Myrbetriq 25 mg samples x4 weeks  Potential side effects including elevated BP discussed  Follow-up [facility] 4 weeks for symptom recheck  2. Personal history nephrolithiasis  Last imaging renal ultrasound 2020  KUB ordered   Abbie Sons, MD  [facility] [address], [place], [place] [address] [phone]

## 2020-07-16 DIAGNOSIS — N3941 Urge incontinence: Secondary | ICD-10-CM | POA: Insufficient documentation

## 2020-07-16 DIAGNOSIS — Z87442 Personal history of urinary calculi: Secondary | ICD-10-CM | POA: Insufficient documentation

## 2020-07-21 ENCOUNTER — Emergency Department: Payer: Medicare Other

## 2020-07-21 ENCOUNTER — Other Ambulatory Visit: Payer: Self-pay

## 2020-07-21 ENCOUNTER — Emergency Department
Admission: EM | Admit: 2020-07-21 | Discharge: 2020-07-21 | Disposition: A | Discharge status: Home / Self Care | Payer: Medicare Other | Attending: Emergency Medicine | Admitting: Emergency Medicine

## 2020-07-21 DIAGNOSIS — Z87891 Personal history of nicotine dependence: Secondary | ICD-10-CM | POA: Diagnosis not present

## 2020-07-21 DIAGNOSIS — R0602 Shortness of breath: Secondary | ICD-10-CM | POA: Insufficient documentation

## 2020-07-21 DIAGNOSIS — I2510 Atherosclerotic heart disease of native coronary artery without angina pectoris: Secondary | ICD-10-CM | POA: Diagnosis not present

## 2020-07-21 DIAGNOSIS — I10 Essential (primary) hypertension: Secondary | ICD-10-CM | POA: Diagnosis not present

## 2020-07-21 DIAGNOSIS — Z955 Presence of coronary angioplasty implant and graft: Secondary | ICD-10-CM | POA: Insufficient documentation

## 2020-07-21 DIAGNOSIS — E039 Hypothyroidism, unspecified: Secondary | ICD-10-CM | POA: Diagnosis not present

## 2020-07-21 DIAGNOSIS — E119 Type 2 diabetes mellitus without complications: Secondary | ICD-10-CM | POA: Diagnosis not present

## 2020-07-21 DIAGNOSIS — Z853 Personal history of malignant neoplasm of breast: Secondary | ICD-10-CM | POA: Diagnosis not present

## 2020-07-21 DIAGNOSIS — Z7901 Long term (current) use of anticoagulants: Secondary | ICD-10-CM | POA: Insufficient documentation

## 2020-07-21 DIAGNOSIS — Z85828 Personal history of other malignant neoplasm of skin: Secondary | ICD-10-CM | POA: Insufficient documentation

## 2020-07-21 DIAGNOSIS — Z79899 Other long term (current) drug therapy: Secondary | ICD-10-CM | POA: Diagnosis not present

## 2020-07-21 DIAGNOSIS — Z950 Presence of cardiac pacemaker: Secondary | ICD-10-CM | POA: Insufficient documentation

## 2020-07-21 DIAGNOSIS — Z794 Long term (current) use of insulin: Secondary | ICD-10-CM | POA: Diagnosis not present

## 2020-07-21 DIAGNOSIS — R609 Edema, unspecified: Secondary | ICD-10-CM

## 2020-07-21 DIAGNOSIS — R600 Localized edema: Secondary | ICD-10-CM | POA: Insufficient documentation

## 2020-07-21 DIAGNOSIS — Z7982 Long term (current) use of aspirin: Secondary | ICD-10-CM | POA: Diagnosis not present

## 2020-07-21 LAB — CBC
HCT: 38.6 % (ref 36.0–46.0)
Hemoglobin: 12.5 g/dL (ref 12.0–15.0)
MCH: 27.4 pg (ref 26.0–34.0)
MCHC: 32.4 g/dL (ref 30.0–36.0)
MCV: 84.6 fL (ref 80.0–100.0)
Platelets: 247 10*3/uL (ref 150–400)
RBC: 4.56 MIL/uL (ref 3.87–5.11)
RDW: 15.7 % — ABNORMAL HIGH (ref 11.5–15.5)
WBC: 7.2 10*3/uL (ref 4.0–10.5)
nRBC: 0 % (ref 0.0–0.2)

## 2020-07-21 LAB — BASIC METABOLIC PANEL
Anion gap: 13 (ref 5–15)
BUN: 40 mg/dL — ABNORMAL HIGH (ref 8–23)
CO2: 26 mmol/L (ref 22–32)
Calcium: 9.4 mg/dL (ref 8.9–10.3)
Chloride: 98 mmol/L (ref 98–111)
Creatinine, Ser: 1.41 mg/dL — ABNORMAL HIGH (ref 0.44–1.00)
GFR, Estimated: 37 mL/min — ABNORMAL LOW (ref >60)
Glucose, Bld: 165 mg/dL — ABNORMAL HIGH (ref 70–99)
Potassium: 3.6 mmol/L (ref 3.5–5.1)
Sodium: 137 mmol/L (ref 135–145)

## 2020-07-21 LAB — CBG MONITORING, ED: Glucose-Capillary: 128 mg/dL — ABNORMAL HIGH (ref 70–99)

## 2020-07-21 LAB — BRAIN NATRIURETIC PEPTIDE: B Natriuretic Peptide: 157.5 pg/mL — ABNORMAL HIGH (ref 0.0–100.0)

## 2020-07-21 MED ORDER — FUROSEMIDE 10 MG/ML IJ SOLN
40.0000 mg | Freq: Once | INTRAMUSCULAR | Status: AC
  Administered 2020-07-21: 40 mg via INTRAVENOUS
  Filled 2020-07-21: qty 4

## 2020-07-21 NOTE — ED Notes (Signed)
Patient transported to X-ray 

## 2020-07-21 NOTE — ED Triage Notes (Signed)
PT to ED via POV from home. PT has had incrased bilat leg and ankle swelling today. HX of same. Took her 40mg  lasix today with no relief. More short of breath than usual.

## 2020-07-21 NOTE — Discharge Instructions (Signed)
As we discussed you can try using compression stockings to help decrease the swelling in your legs, especially if you are going to be on your feet. You can also try to elevate your legs if you are lying or sitting down. Please seek medical attention for any high fevers, chest pain, shortness of breath, change in behavior, persistent vomiting, bloody stool or any other new or concerning symptoms.

## 2020-07-21 NOTE — ED Notes (Signed)
Pt arrived with c/o bilateral leg swelling two days ago. Pt denies pain or trouble ambulating at this time. Pt uses walker at baseline.   Pt c/o SOB starting this morning.   Pt states she's been hospitalized for this before, but is unsure what her dx was at that time.

## 2020-07-21 NOTE — ED Provider Notes (Signed)
[facility] Emergency Department Provider Note  ____________________________________________   I have reviewed the triage vital signs and the nursing notes.   HISTORY  Chief Complaint Leg Swelling   History limited by: Not Limited   HPI Cassandra [name] is a 86 y.o. female who presents to the emergency department today because of concerns for leg swelling and shortness of breath.  Patient states that the symptoms started yesterday.  She has noticed bilateral leg swelling.  There is no associated pain.  Patient is also felt some shortness of breath.  She states she is quite anxious about this because she has history of significant fluid overload requiring multiple liters of fluid being pulled off.  Patient denies any chest pain.  She states that she did take double dose of her Lasix earlier this morning.  This has resulted in a fair amount of urine output although the patient has not noticed any decrease in her leg swelling. She denies any chest pain.  Records reviewed. Per medical record review patient has a history of CAD  Past Medical History:  Diagnosis Date  . Acquired hypothyroidism 01/06/2014  . Anxiety   . Arthritis   . B12 deficiency 02/15/2014  . Benign essential hypertension 01/06/2014  . Breast cancer [facility]) 1982   Right breast cancer - chemotherapy  . CAD (coronary artery disease), native coronary artery 01/06/2014  . Carotid artery calcification   . Chronic airway obstruction, not elsewhere classified 01/06/2014  . Depression   . Diabetes mellitus without complication ([place])   . History of kidney stones 2019   left ureteral stone  . Incontinence of urine   . Myocardial infarction ([place]) 1982   small infarct  . Neuromuscular disorder (HCC)    restless legs  . Pacemaker   . Presence of permanent cardiac pacemaker 07/2016  . Pure hypercholesterolemia 01/06/2014  . Renal insufficiency   . Skin cancer   . Sleep apnea    uses cpap    Patient Active  Problem List   Diagnosis Date Noted  . Urge incontinence 07/16/2020  . History of nephrolithiasis 07/16/2020  . 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 Acute vomiting 11/01/2019  . Dizziness 11/01/2019  . Falls frequently 11/01/2019  . Visual hallucinations 11/01/2019  . Loss of weight 10/21/2019  . Carotid stenosis 10/20/2019  . Pacemaker 10/08/2019  . Moderate episode of recurrent major depressive disorder ([place]) 12/14/2018  . Near syncope 10/04/2018  . Non-STEMI (non-ST elevated myocardial infarction) ([place]) 08/18/2018  . Abnormal UGI series 03/31/2018  . Lymphedema 03/01/2018  . Acute respiratory failure with hypoxia ([place]) 09/16/2017  . Sepsis ([place]) 08/17/2017  . UTI (urinary tract infection) 08/17/2017  . Acute respiratory distress 08/17/2017  . 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 Sick sinus syndrome ([place]) 07/03/2016  . New onset atrial flutter ([place]) 06/14/2016  . Bradycardia, sinus 06/07/2016  . Syncope 06/06/2016  . Chest pain 04/21/2016  . Elevated troponin 04/21/2016  . Labile hypertension 04/21/2016  . Ischemic chest pain ([place]) 04/21/2016  . Long-term insulin use ([place]) 05/24/2014  . Mild vitamin D deficiency 05/20/2014  . Vitamin D deficiency 05/20/2014  . B12 deficiency 02/15/2014  . Acquired hypothyroidism 01/06/2014  . Chronic airway obstruction, not elsewhere classified 01/06/2014  . CAD (coronary artery disease), native coronary artery 01/06/2014  . Benign essential hypertension 01/06/2014  . Headache 01/06/2014  . Hypersomnia with sleep apnea 01/06/2014  . Pure hypercholesterolemia 01/06/2014  . Type II diabetes mellitus with manifestations ([place])  01/06/2014  . Hyperlipidemia associated with type 2 diabetes mellitus ([place]) 01/06/2014    Past Surgical History:  Procedure Laterality Date  . APPENDECTOMY    . AUGMENTATION MAMMAPLASTY Bilateral 1982/redo in 2014   prior mastectomy  . CARDIAC CATHETERIZATION N/A 04/23/2016    Procedure: Left Heart Cath and Coronary Angiography;  Surgeon: Corey Skains, MD;  Location: [place] [facility];  Service: Cardiovascular;  Laterality: N/A;  . CARDIAC CATHETERIZATION Left 04/23/2016   Procedure: Coronary Stent Intervention;  Surgeon: Yolonda Kida, MD;  Location: [place] [facility];  Service: Cardiovascular;  Laterality: Left;  . CAROTID ARTERY ANGIOPLASTY Left 2010   had stent inserted and removed d/t infection. artery from leg inserted in left carotid  . CAROTID ENDARTERECTOMY Left 2010  . CHOLECYSTECTOMY    . CORONARY ANGIOPLASTY WITH STENT PLACEMENT  september 9th 2017  . CYSTOSCOPY W/ URETERAL STENT PLACEMENT Left 08/17/2017   Procedure: CYSTOSCOPY WITH RETROGRADE PYELOGRAM/URETERAL STENT PLACEMENT;  Surgeon: Festus Aloe, MD;  Location: [facility];  Service: Urology;  Laterality: Left;  . CYSTOSCOPY W/ URETERAL STENT PLACEMENT Left 09/16/2017   Procedure: CYSTOSCOPY WITH STENT REPLACEMENT;  Surgeon: Abbie Sons, MD;  Location: [facility];  Service: Urology;  Laterality: Left;  . CYSTOSCOPY/RETROGRADE/URETEROSCOPY/STONE EXTRACTION WITH BASKET Left 09/16/2017   Procedure: CYSTOSCOPY/RETROGRADE/URETEROSCOPY/STONE EXTRACTION WITH BASKET;  Surgeon: Abbie Sons, MD;  Location: [facility];  Service: Urology;  Laterality: Left;  . EYE SURGERY Bilateral    cataract extraction  . KYPHOPLASTY N/A 02/03/2018   Procedure: [id number];  Surgeon: Hessie Knows, MD;  Location: [facility];  Service: Orthopedics;  Laterality: N/A;  . MASTECTOMY Right 1982  . PACEMAKER INSERTION Left 07/03/2016   Procedure: INSERTION PACEMAKER;  Surgeon: Isaias Cowman, MD;  Location: [facility];  Service: Cardiovascular;  Laterality: Left;  . SKIN CANCER EXCISION    . TUBAL LIGATION    . VISCERAL ANGIOGRAPHY N/A 11/03/2019   Procedure: VISCERAL ANGIOGRAPHY;  Surgeon: Katha Cabal, MD;  Location: [place] [facility];  Service: Cardiovascular;  Laterality: N/A;    Prior to  Admission medications   Medication Sig Start Date End Date Taking? Authorizing Provider  ammonium lactate (AMLACTIN) 12 % lotion Apply 1 application topically as needed for dry skin. 10/05/19   Felipa Furnace, DPM  aspirin EC 81 MG tablet Take 81 mg by mouth daily.     [provider]  buPROPion (WELLBUTRIN XL) 150 MG 24 hr tablet Take 150 mg by mouth daily.  09/06/19   [provider]  carbidopa-levodopa (SINEMET IR) 25-100 MG tablet Take 1 tablet by mouth at bedtime.    [provider]  cefdinir (OMNICEF) 300 MG capsule Take 1 capsule (300 mg total) by mouth 2 (two) times daily. 10/05/18   Salary, Holly Bodily D, MD  Cholecalciferol (VITAMIN D3) 2000 units TABS Take 2,000 Units by mouth daily.    [provider]  clopidogrel (PLAVIX) 75 MG tablet Take 1 tablet (75 mg total) by mouth daily. 11/04/19   Schnier, Dolores Lory, MD  Continuous Blood Gluc Receiver (FREESTYLE LIBRE 14 DAY READER) DEVI Use 1 each as needed .  Use to test blood sugar 10/22/18   [provider]  Continuous Blood Gluc Sensor (FREESTYLE LIBRE 14 DAY SENSOR) MISC Use 1 each every 14 (fourteen) days 10/22/18   [provider]  furosemide (LASIX) 20 MG tablet Take by mouth. 06/20/20 06/20/21  [provider]  HUMALOG KWIKPEN 100 UNIT/ML KiwkPen Inject 10 Units into the skin  3 (three) times daily. *Use as directed per sliding scale* 03/03/15   [provider]  insulin glargine (LANTUS) 100 UNIT/ML injection Inject 0.5 mLs (50 Units total) into the skin at bedtime. Patient taking differently: Inject 12 Units into the skin at bedtime.  08/21/18   Bettey Costa, MD  levothyroxine (SYNTHROID, LEVOTHROID) 112 MCG tablet Take 1 tablet (112 mcg total) by mouth daily before breakfast. 06/17/16   Bettey Costa, MD  magnesium oxide (MAG-OX) 400 MG tablet Take 400 mg by mouth at bedtime.  09/24/19   [provider]  metFORMIN (GLUCOPHAGE-XR) 500 MG 24 hr tablet Take 2 tablets (1,000 mg  total) by mouth 2 (two) times daily. Please start this on 8/24 08/21/18   Bettey Costa, MD  metoprolol succinate (TOPROL-XL) 25 MG 24 hr tablet Take 1 tablet (25 mg total) by mouth daily. 10/06/18   Salary, Avel Peace, MD  Multiple Vitamin (MULTIVITAMIN WITH MINERALS) TABS tablet Take 1 tablet by mouth daily. 09/24/17   Max Sane, MD  nitroGLYCERIN (NITROSTAT) 0.4 MG SL tablet Place 1 tablet (0.4 mg total) under the tongue every 5 (five) minutes as needed for chest pain. 04/24/16   Bettey Costa, MD  pantoprazole (PROTONIX) 40 MG tablet TAKE 1 TABLET BY MOUTH DAILY TAKE 15-20 MINS BEFORE A MEAL 05/22/20   [provider]  PARoxetine (PAXIL) 10 MG tablet Take 1 tablet by mouth daily. 04/20/20   [provider]  pioglitazone (ACTOS) 30 MG tablet Take by mouth. 04/14/20 04/14/21  [provider]  pramipexole (MIRAPEX) 0.5 MG tablet Take 0.5 mg by mouth at bedtime. 09/24/19   [provider]  rOPINIRole (REQUIP) 0.5 MG tablet Take 0.5 mg by mouth in the morning and at bedtime.    [provider]  rosuvastatin (CRESTOR) 10 MG tablet Take 2 tablets (20 mg total) by mouth at bedtime. Patient taking differently: Take 10 mg by mouth at bedtime.  08/19/18   Demetrios Loll, MD  solifenacin (VESICARE) 10 MG tablet Take by mouth. 04/14/20 04/14/21  [provider]  triamterene-hydrochlorothiazide (DYAZIDE) 37.5-25 MG capsule Take 1 capsule by mouth daily.  09/24/19   [provider]    Allergies Patient has no known allergies.  Family History  Problem Relation Age of Onset  . Prostate cancer Neg Hx   . Kidney cancer Neg Hx   . Breast cancer Neg Hx     Social History Social History   Tobacco Use  . Smoking status: Former Smoker    Packs/day: 0.50    Types: Cigarettes    Quit date: 09/02/1996    Years since quitting: 23.8  . Smokeless tobacco: Never Used  Vaping Use  . Vaping Use: Never used  Substance Use Topics  . Alcohol use: Not Currently     Alcohol/week: 1.0 standard drink    Types: 1 Glasses of wine per week  . Drug use: No    Review of Systems Constitutional: No fever/chills Eyes: No visual changes. ENT: No sore throat. Cardiovascular: Denies chest pain. Respiratory: Positive for shortness of breath. Gastrointestinal: No abdominal pain.  No nausea, no vomiting.  No diarrhea.   Genitourinary: Negative for dysuria. Musculoskeletal: Positive for bilateral leg swelling. Skin: Negative for rash. Neurological: Negative for headaches, focal weakness or numbness.  ____________________________________________   PHYSICAL EXAM:  VITAL SIGNS: ED Triage Vitals [07/21/20 1655]  Enc Vitals Group     BP (!) 146/53     Pulse Rate 61     Resp 20  Temp (!) 97.5 F (36.4 C)     Temp Source Oral     SpO2 96 %     Weight 152 lb (68.9 kg)     Height 5\' 2"  (1.575 m)     Head Circumference      Peak Flow      Pain Score 0   Constitutional: Alert and oriented.  Eyes: Conjunctivae are normal.  ENT      Head: Normocephalic and atraumatic.      Nose: No congestion/rhinnorhea.      Mouth/Throat: Mucous membranes are moist.      Neck: No stridor. Hematological/Lymphatic/Immunilogical: No cervical lymphadenopathy. Cardiovascular: Normal rate, regular rhythm.  No murmurs, rubs, or gallops.  Respiratory: Normal respiratory effort without tachypnea nor retractions. Crackles in bilateral bases.  Gastrointestinal: Soft and non tender. No rebound. No guarding.  Genitourinary: Deferred Musculoskeletal: Normal range of motion in all extremities. Bilateral lower extremity edema.  Neurologic:  Normal speech and language. No gross focal neurologic deficits are appreciated.  Skin:  Skin is warm, dry and intact. No rash noted. Psychiatric: Mood and affect are normal. Speech and behavior are normal. Patient exhibits appropriate insight and judgment.  ____________________________________________    LABS (pertinent  positives/negatives)  BMP wnl except glu 165, bun 40, cr 1.41 CBC wbc 7.2, hgb 12.5, plt 247 BNP 157.5 ____________________________________________   EKG  I, Nance Pear, attending physician, personally viewed and interpreted this EKG  EKG Time: 1657 Rate: 66 Rhythm: atrial paced rhythm Axis: left axis deviation Intervals: qtc 467 QRS: LAFB ST changes: no st elevation Impression: abnormal ekg ____________________________________________    RADIOLOGY  CXR No active cardiopulmonary disease  ____________________________________________   PROCEDURES  Procedures  ____________________________________________   INITIAL IMPRESSION / ASSESSMENT AND PLAN / ED COURSE  Pertinent labs & imaging results that were available during my care of the patient were reviewed by me and considered in my medical decision making (see chart for details).   Presented to the emergency department today because of concerns for leg swelling and shortness of breath.  Patient does have a history of heart failure and took double her normal dose of Lasix this morning.  On exam patient did have some mild crackles in the lung bases bilaterally.  No significant respiratory distress.  Patient was given IV Lasix here with good urine output.  She stated she felt much improved.  At this point do think is reasonable for patient be discharged home.  Did discuss with patient importance of follow-up with primary care.   ____________________________________________   FINAL CLINICAL IMPRESSION(S) / ED DIAGNOSES  Final diagnoses:  Peripheral edema     Note: This dictation was prepared with Dragon dictation. Any transcriptional errors that result from this process are unintentional     Nance Pear, MD 07/21/20 2206

## 2020-08-15 NOTE — Progress Notes (Signed)
08/16/2020 11:52 AM   Cassandra [name] 1932/11/01 [id number]  Referring provider: Idelle Crouch, MD Cassandra [name] [facility] [place],  [place] [address]  Chief Complaint  Patient presents with  . Follow-up    Urologic history: 1. Stone disease -Left ureteroscopic stone removal 09/2017 -Stent placement prior due to sepsis  2. Urinary incontinence -PT referral Dr. Matilde [name] 2017   HPI: 86 y.o. female presents for evaluation of worsening urinary tract symptoms.   Complains urinary frequency, urgency with urge incontinence  Symptoms worse since starting Lasix for edema  Denies dysuria, gross hematuria  Denies flank, abdominal or pelvic pain  Started on Mybetriq 25 mg daily at last visit    KUB today notes a 2 mm right renal stone.    She feels the Myrbetriq samples did not make a difference with her urinary symptoms.  She feels that her urgency and urge incontinence is mostly due to her delaying voiding.  I asked her if she went to use the restroom every time she felt the urge to urinate would be excessive and she stated no.  I then asked her how long she could go without using the rest room and not have the urge incontinence and she stated 2 hours.  She is not wanting to take another medication at this time as she is taking Lasix and does not want to interfere with her bladder emptying.  She will continue the Vesicare.    Patient denies any modifying or aggravating factors.  Patient denies any gross hematuria, dysuria or suprapubic/flank pain.  Patient denies any fevers, chills, nausea or vomiting.   PVR is 24 mL.  PMH: Past Medical History:  Diagnosis Date  . Acquired hypothyroidism 01/06/2014  . Anxiety   . Arthritis   . B12 deficiency 02/15/2014  . Benign essential hypertension 01/06/2014  . Breast cancer [name]) 1982   Right breast cancer - chemotherapy  . CAD (coronary artery disease), native coronary artery 01/06/2014  . Carotid artery  calcification   . Chronic airway obstruction, not elsewhere classified 01/06/2014  . Depression   . Diabetes mellitus without complication ([place])   . History of kidney stones 2019   left ureteral stone  . Incontinence of urine   . Myocardial infarction ([place]) 1982   small infarct  . Neuromuscular disorder (HCC)    restless legs  . Pacemaker   . Presence of permanent cardiac pacemaker 07/2016  . Pure hypercholesterolemia 01/06/2014  . Renal insufficiency   . Skin cancer   . Sleep apnea    uses cpap    Surgical History: Past Surgical History:  Procedure Laterality Date  . APPENDECTOMY    . AUGMENTATION MAMMAPLASTY Bilateral 1982/redo in 2014   prior mastectomy  . CARDIAC CATHETERIZATION N/A 04/23/2016   Procedure: Left Heart Cath and Coronary Angiography;  Surgeon: Corey Skains, MD;  Location: [place] [facility];  Service: Cardiovascular;  Laterality: N/A;  . CARDIAC CATHETERIZATION Left 04/23/2016   Procedure: Coronary Stent Intervention;  Surgeon: Yolonda Kida, MD;  Location: [place] [facility];  Service: Cardiovascular;  Laterality: Left;  . CAROTID ARTERY ANGIOPLASTY Left 2010   had stent inserted and removed d/t infection. artery from leg inserted in left carotid  . CAROTID ENDARTERECTOMY Left 2010  . CHOLECYSTECTOMY    . CORONARY ANGIOPLASTY WITH STENT PLACEMENT  september 9th 2017  . CYSTOSCOPY W/ URETERAL STENT PLACEMENT Left 08/17/2017   Procedure: CYSTOSCOPY WITH RETROGRADE PYELOGRAM/URETERAL STENT PLACEMENT;  Surgeon: Junious Silk,  Rodman Key, MD;  Location: [facility];  Service: Urology;  Laterality: Left;  . CYSTOSCOPY W/ URETERAL STENT PLACEMENT Left 09/16/2017   Procedure: CYSTOSCOPY WITH STENT REPLACEMENT;  Surgeon: Abbie Sons, MD;  Location: [facility];  Service: Urology;  Laterality: Left;  . CYSTOSCOPY/RETROGRADE/URETEROSCOPY/STONE EXTRACTION WITH BASKET Left 09/16/2017   Procedure: CYSTOSCOPY/RETROGRADE/URETEROSCOPY/STONE EXTRACTION WITH BASKET;  Surgeon:  Abbie Sons, MD;  Location: [facility];  Service: Urology;  Laterality: Left;  . EYE SURGERY Bilateral    cataract extraction  . KYPHOPLASTY N/A 02/03/2018   Procedure: [id number];  Surgeon: Hessie Knows, MD;  Location: [facility];  Service: Orthopedics;  Laterality: N/A;  . MASTECTOMY Right 1982  . PACEMAKER INSERTION Left 07/03/2016   Procedure: INSERTION PACEMAKER;  Surgeon: Isaias Cowman, MD;  Location: [facility];  Service: Cardiovascular;  Laterality: Left;  . SKIN CANCER EXCISION    . TUBAL LIGATION    . VISCERAL ANGIOGRAPHY N/A 11/03/2019   Procedure: VISCERAL ANGIOGRAPHY;  Surgeon: Katha Cabal, MD;  Location: [place] [facility];  Service: Cardiovascular;  Laterality: N/A;    Home Medications:  Allergies as of 08/16/2020   No Known Allergies     Medication List       Accurate as of August 16, 2020 11:52 AM. If you have any questions, ask your nurse or doctor.        amitriptyline 10 MG tablet Commonly known as: ELAVIL Take 10 mg by mouth at bedtime.   ammonium lactate 12 % lotion Commonly known as: AmLactin Apply 1 application topically as needed for dry skin.   aspirin EC 81 MG tablet Take 81 mg by mouth daily.   buPROPion 150 MG 24 hr tablet Commonly known as: WELLBUTRIN XL Take 150 mg by mouth daily.   carbidopa-levodopa 25-100 MG tablet Commonly known as: SINEMET IR Take 1 tablet by mouth at bedtime.   cefdinir 300 MG capsule Commonly known as: OMNICEF Take 1 capsule (300 mg total) by mouth 2 (two) times daily.   clopidogrel 75 MG tablet Commonly known as: Plavix Take 1 tablet (75 mg total) by mouth daily.   diazepam 2 MG tablet Commonly known as: VALIUM Take 2 mg by mouth 2 (two) times daily.   FreeStyle Libre 14 Day Reader Kerrin Mo Use 1 each as needed .  Use to test blood sugar   FreeStyle Libre 14 Day Sensor Misc Use 1 each every 14 (fourteen) days   furosemide 20 MG tablet Commonly known as: LASIX Take by mouth.    HumaLOG KwikPen 100 UNIT/ML KwikPen Generic drug: insulin lispro Inject 10 Units into the skin 3 (three) times daily. *Use as directed per sliding scale*   insulin glargine 100 UNIT/ML injection Commonly known as: LANTUS Inject 0.5 mLs (50 Units total) into the skin at bedtime. What changed: how much to take   levothyroxine 112 MCG tablet Commonly known as: SYNTHROID Take 1 tablet (112 mcg total) by mouth daily before breakfast.   magnesium oxide 400 MG tablet Commonly known as: MAG-OX Take 400 mg by mouth at bedtime.   metFORMIN 500 MG 24 hr tablet Commonly known as: GLUCOPHAGE-XR Take 2 tablets (1,000 mg total) by mouth 2 (two) times daily. Please start this on 8/24   metoprolol succinate 25 MG 24 hr tablet Commonly known as: TOPROL-XL Take 1 tablet (25 mg total) by mouth daily.   multivitamin with minerals Tabs tablet Take 1 tablet by mouth daily.   nitroGLYCERIN 0.4 MG SL tablet Commonly known as: NITROSTAT Place 1  tablet (0.4 mg total) under the tongue every 5 (five) minutes as needed for chest pain.   pantoprazole 40 MG tablet Commonly known as: PROTONIX TAKE 1 TABLET BY MOUTH DAILY TAKE 15-20 MINS BEFORE A MEAL   PARoxetine 10 MG tablet Commonly known as: PAXIL Take 1 tablet by mouth daily.   pioglitazone 30 MG tablet Commonly known as: ACTOS Take by mouth.   pramipexole 0.5 MG tablet Commonly known as: MIRAPEX Take 0.5 mg by mouth at bedtime.   rOPINIRole 0.5 MG tablet Commonly known as: REQUIP Take 0.5 mg by mouth in the morning and at bedtime.   rosuvastatin 10 MG tablet Commonly known as: CRESTOR Take 2 tablets (20 mg total) by mouth at bedtime. What changed: how much to take   solifenacin 10 MG tablet Commonly known as: VESICARE Take by mouth.   triamterene-hydrochlorothiazide 37.5-25 MG capsule Commonly known as: DYAZIDE Take 1 capsule by mouth daily.   Vitamin D3 50 MCG (2000 UT) Tabs Take 2,000 Units by mouth daily.        Allergies: No Known Allergies  Family History: Family History  Problem Relation Age of Onset  . Prostate cancer Neg Hx   . Kidney cancer Neg Hx   . Breast cancer Neg Hx     Social History:  reports that she quit smoking about 23 years ago. Her smoking use included cigarettes. She smoked 0.50 packs per day. She has never used smokeless tobacco. She reports previous alcohol use of about 1.0 standard drink of alcohol per week. She reports that she does not use drugs.   Physical Exam: BP (!) 141/81 (BP Location: Left Arm, Patient Position: Sitting, Cuff Size: Normal)   Pulse 69   Ht 5\' 2"  (1.575 m)   Wt 152 lb (68.9 kg)   LMP  (LMP Unknown) Comment: age 74  BMI 27.80 kg/m   Constitutional:  Well nourished. Alert and oriented, No acute distress. HEENT: [place] AT, mask in place.  Trachea midline Cardiovascular: No clubbing, cyanosis, or edema. Respiratory: Normal respiratory effort, no increased work of breathing. Neurologic: Grossly intact, no focal deficits, moving all 4 extremities.  In wheelchair. Psychiatric: Normal mood and affect.   Pertinent Imaging Narrative & Impression  CLINICAL DATA:  Nephrolithiasis.  EXAM: ABDOMEN - 1 VIEW  COMPARISON:  CT abdomen and pelvis 01/07/2018  FINDINGS: A 2 mm calcification projecting over the upper pole of the right kidney likely corresponds to the punctate renal stone on the prior CT. No other definite renal or ureteral calculi are identified. There is a large amount of stool in the colon. No dilated loops of bowel are seen to suggest obstruction. Right upper quadrant abdominal surgical clips, pacemaker leads, and a chronic, augmented T11 compression fracture are noted.  IMPRESSION: Punctate right renal calculus.   Electronically Signed   By: Logan Bores M.D.   On: 08/16/2020 15:30   Results for JAMISYN, LANGER (MRN [id number]) as of 08/16/2020 11:54  Ref. Range 08/16/2020 11:11  Scan Result Unknown 45mL  I have  independently reviewed the films.  See HPI.     Assessment & Plan:    1. Urge incontinence  Bladder scan PVR 24 mL She did not find the Myrbetriq samples effective in controlling her urge incontinence She will try not to postpone her voids as she feels that is the issue She will continue the Vesicare  2. Personal history nephrolithiasis  KUB small right renal stone  KUB in one year    [facility]  Marry Guan  [facility] [address] [place], [place] [address] [phone]

## 2020-08-16 ENCOUNTER — Ambulatory Visit
Admission: RE | Admit: 2020-08-16 | Discharge: 2020-08-16 | Disposition: A | Discharge status: Home / Self Care | Payer: Medicare Other | Source: Ambulatory Visit | Attending: Urology | Admitting: Urology

## 2020-08-16 ENCOUNTER — Encounter: Payer: Self-pay | Admitting: Urology

## 2020-08-16 ENCOUNTER — Other Ambulatory Visit: Payer: Self-pay

## 2020-08-16 ENCOUNTER — Ambulatory Visit (INDEPENDENT_AMBULATORY_CARE_PROVIDER_SITE_OTHER): Payer: Medicare Other | Admitting: Urology

## 2020-08-16 VITALS — BP 141/81 | HR 69 | Ht 62.0 in | Wt 152.0 lb

## 2020-08-16 DIAGNOSIS — Z87442 Personal history of urinary calculi: Secondary | ICD-10-CM

## 2020-08-16 DIAGNOSIS — N3941 Urge incontinence: Secondary | ICD-10-CM

## 2020-08-16 DIAGNOSIS — N200 Calculus of kidney: Secondary | ICD-10-CM

## 2020-08-16 LAB — BLADDER SCAN AMB NON-IMAGING

## 2020-08-16 NOTE — Patient Instructions (Signed)
Please get your X-ray prior to your appointment

## 2020-09-06 ENCOUNTER — Encounter: Payer: Self-pay | Admitting: Emergency Medicine

## 2020-09-06 ENCOUNTER — Other Ambulatory Visit: Payer: Self-pay

## 2020-09-06 ENCOUNTER — Emergency Department
Admission: EM | Admit: 2020-09-06 | Discharge: 2020-09-07 | Disposition: A | Discharge status: Home / Self Care | Payer: Medicare Other | Attending: Emergency Medicine | Admitting: Emergency Medicine

## 2020-09-06 ENCOUNTER — Emergency Department: Payer: Medicare Other

## 2020-09-06 DIAGNOSIS — Z85828 Personal history of other malignant neoplasm of skin: Secondary | ICD-10-CM | POA: Insufficient documentation

## 2020-09-06 DIAGNOSIS — E119 Type 2 diabetes mellitus without complications: Secondary | ICD-10-CM | POA: Insufficient documentation

## 2020-09-06 DIAGNOSIS — Z955 Presence of coronary angioplasty implant and graft: Secondary | ICD-10-CM | POA: Diagnosis not present

## 2020-09-06 DIAGNOSIS — Z950 Presence of cardiac pacemaker: Secondary | ICD-10-CM | POA: Diagnosis not present

## 2020-09-06 DIAGNOSIS — S022XXA Fracture of nasal bones, initial encounter for closed fracture: Secondary | ICD-10-CM | POA: Diagnosis not present

## 2020-09-06 DIAGNOSIS — Z7984 Long term (current) use of oral hypoglycemic drugs: Secondary | ICD-10-CM | POA: Insufficient documentation

## 2020-09-06 DIAGNOSIS — J449 Chronic obstructive pulmonary disease, unspecified: Secondary | ICD-10-CM | POA: Insufficient documentation

## 2020-09-06 DIAGNOSIS — S0992XA Unspecified injury of nose, initial encounter: Secondary | ICD-10-CM | POA: Diagnosis present

## 2020-09-06 DIAGNOSIS — Z794 Long term (current) use of insulin: Secondary | ICD-10-CM | POA: Insufficient documentation

## 2020-09-06 DIAGNOSIS — Z79899 Other long term (current) drug therapy: Secondary | ICD-10-CM | POA: Insufficient documentation

## 2020-09-06 DIAGNOSIS — S0990XA Unspecified injury of head, initial encounter: Secondary | ICD-10-CM | POA: Diagnosis not present

## 2020-09-06 DIAGNOSIS — Z87891 Personal history of nicotine dependence: Secondary | ICD-10-CM | POA: Insufficient documentation

## 2020-09-06 DIAGNOSIS — I2510 Atherosclerotic heart disease of native coronary artery without angina pectoris: Secondary | ICD-10-CM | POA: Insufficient documentation

## 2020-09-06 DIAGNOSIS — I10 Essential (primary) hypertension: Secondary | ICD-10-CM | POA: Diagnosis not present

## 2020-09-06 DIAGNOSIS — E039 Hypothyroidism, unspecified: Secondary | ICD-10-CM | POA: Diagnosis not present

## 2020-09-06 DIAGNOSIS — Z7982 Long term (current) use of aspirin: Secondary | ICD-10-CM | POA: Insufficient documentation

## 2020-09-06 DIAGNOSIS — W108XXA Fall (on) (from) other stairs and steps, initial encounter: Secondary | ICD-10-CM | POA: Diagnosis not present

## 2020-09-06 DIAGNOSIS — Z853 Personal history of malignant neoplasm of breast: Secondary | ICD-10-CM | POA: Insufficient documentation

## 2020-09-06 MED ORDER — OXYMETAZOLINE HCL 0.05 % NA SOLN: 1.0000 | Freq: Two times a day (BID) | NASAL | 0 refills | Status: AC

## 2020-09-06 MED ORDER — AMOXICILLIN-POT CLAVULANATE 875-125 MG PO TABS: 1.0000 | ORAL_TABLET | Freq: Two times a day (BID) | ORAL | 0 refills | Status: AC

## 2020-09-06 NOTE — ED Triage Notes (Signed)
Pt states misstepped off of the curb and hit her face, pt with noted wound to bridge of nose that is oozing, controlled with pressure. Pt with noted bruising from bridge of nose to chin. Pt denies LOC, pt states is on blood thinner

## 2020-09-06 NOTE — Discharge Instructions (Addendum)
Take the antibiotic as prescribed and finish the full course.  Use the Afrin nasal spray twice daily in each nostril for the next 3 days.  Do not blow your nose.  Follow-up with Dr. Richardson Landry in approximately 1 week.  Follow-up with Dr. [name] on Monday as scheduled.  Return to the ER immediately for new, worsening, or persistent severe bleeding, pain, swelling, severe headache, vomiting, or any other new or worsening symptoms that concern you.

## 2020-09-06 NOTE — ED Notes (Signed)
First RN note:  Pt comes into the ED via [facility] from a store where she missed a step and fell hitting her face.  Pt does present with epistaxis that is controlled.  Afrin spray given in each nare.  Pt A&Ox4, last BP 164/77, CBG 169, paced rhythm with Hr at 73.

## 2020-09-06 NOTE — ED Provider Notes (Signed)
[facility] Emergency Department Provider Note ____________________________________________   Event Date/Time   First MD Initiated Contact with Patient 09/06/20 1637     (approximate)  I have reviewed the triage vital signs and the nursing notes.   HISTORY  Chief Complaint Fall    HPI Cassandra [name] is a 87 y.o. female with PMH as noted below including history of CAD on aspirin and Plavix who presents with a nasal injury, acute onset when the patient states that she missed a step and fell forward.  She did not lose consciousness.  She reports a mild headache but primarily pain to her nose and bleeding from the nostrils.  She states that she used Afrin and the bleeding has subsided somewhat.  She denies any other injuries.  Past Medical History:  Diagnosis Date  . Acquired hypothyroidism 01/06/2014  . Anxiety   . Arthritis   . B12 deficiency 02/15/2014  . Benign essential hypertension 01/06/2014  . Breast cancer [facility]) 1982   Right breast cancer - chemotherapy  . CAD (coronary artery disease), native coronary artery 01/06/2014  . Carotid artery calcification   . Chronic airway obstruction, not elsewhere classified 01/06/2014  . Depression   . Diabetes mellitus without complication ([place])   . History of kidney stones 2019   left ureteral stone  . Incontinence of urine   . Myocardial infarction ([place]) 1982   small infarct  . Neuromuscular disorder (HCC)    restless legs  . Pacemaker   . Presence of permanent cardiac pacemaker 07/2016  . Pure hypercholesterolemia 01/06/2014  . Renal insufficiency   . Skin cancer   . Sleep apnea    uses cpap    Patient Active Problem List   Diagnosis Date Noted  . Urge incontinence 07/16/2020  . History of nephrolithiasis 07/16/2020  . 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 Acute vomiting 11/01/2019  . Dizziness 11/01/2019  . Falls frequently 11/01/2019  . Visual hallucinations 11/01/2019  . Loss of weight  10/21/2019  . Carotid stenosis 10/20/2019  . Pacemaker 10/08/2019  . Moderate episode of recurrent major depressive disorder ([place]) 12/14/2018  . Near syncope 10/04/2018  . Non-STEMI (non-ST elevated myocardial infarction) ([place]) 08/18/2018  . Abnormal UGI series 03/31/2018  . Lymphedema 03/01/2018  . Acute respiratory failure with hypoxia ([place]) 09/16/2017  . Sepsis ([place]) 08/17/2017  . UTI (urinary tract infection) 08/17/2017  . Acute respiratory distress 08/17/2017  . 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 Sick sinus syndrome ([place]) 07/03/2016  . New onset atrial flutter ([place]) 06/14/2016  . Bradycardia, sinus 06/07/2016  . Syncope 06/06/2016  . Chest pain 04/21/2016  . Elevated troponin 04/21/2016  . Labile hypertension 04/21/2016  . Ischemic chest pain ([place]) 04/21/2016  . Long-term insulin use ([place]) 05/24/2014  . Mild vitamin D deficiency 05/20/2014  . Vitamin D deficiency 05/20/2014  . B12 deficiency 02/15/2014  . Acquired hypothyroidism 01/06/2014  . Chronic airway obstruction, not elsewhere classified 01/06/2014  . CAD (coronary artery disease), native coronary artery 01/06/2014  . Benign essential hypertension 01/06/2014  . Headache 01/06/2014  . Hypersomnia with sleep apnea 01/06/2014  . Pure hypercholesterolemia 01/06/2014  . Type II diabetes mellitus with manifestations ([place]) 01/06/2014  . Hyperlipidemia associated with type 2 diabetes mellitus ([place]) 01/06/2014    Past Surgical History:  Procedure Laterality Date  . APPENDECTOMY    . AUGMENTATION MAMMAPLASTY Bilateral 1982/redo in 2014   prior mastectomy  . CARDIAC CATHETERIZATION N/A 04/23/2016   Procedure:  Left Heart Cath and Coronary Angiography;  Surgeon: Corey Skains, MD;  Location: [place] [facility];  Service: Cardiovascular;  Laterality: N/A;  . CARDIAC CATHETERIZATION Left 04/23/2016   Procedure: Coronary Stent Intervention;  Surgeon: Yolonda Kida, MD;  Location: [place] [facility]  [facility];  Service: Cardiovascular;  Laterality: Left;  . CAROTID ARTERY ANGIOPLASTY Left 2010   had stent inserted and removed d/t infection. artery from leg inserted in left carotid  . CAROTID ENDARTERECTOMY Left 2010  . CHOLECYSTECTOMY    . CORONARY ANGIOPLASTY WITH STENT PLACEMENT  september 9th 2017  . CYSTOSCOPY W/ URETERAL STENT PLACEMENT Left 08/17/2017   Procedure: CYSTOSCOPY WITH RETROGRADE PYELOGRAM/URETERAL STENT PLACEMENT;  Surgeon: Festus Aloe, MD;  Location: [facility] OR[facility];  Service: Urology;  Laterality: Left;  . CYSTOSCOPY W/ URETERAL STENT PLACEMENT Left 09/16/2017   Procedure: CYSTOSCOPY WITH STENT REPLACEMENT;  Surgeon: Abbie Sons, MD;  Location: [facility] ORS;  Service: Urology;  Laterality: Left;  . CYSTOSCOPY/RETROGRADE/URETEROSCOPY/STONE EXTRACTION WITH BASKET Left 09/16/2017   Procedure: CYSTOSCOPY/RETROGRADE/URETEROSCOPY/STONE EXTRACTION WITH BASKET;  Surgeon: Abbie Sons, MD;  Location: [facility] ORS;  Service: Urology;  Laterality: Left;  . EYE SURGERY Bilateral    cataract extraction  . KYPHOPLASTY N/A 02/03/2018   Procedure: [id number];  Surgeon: Hessie Knows, MD;  Location: [facility] ORS;  Service: Orthopedics;  Laterality: N/A;  . MASTECTOMY Right 1982  . PACEMAKER INSERTION Left 07/03/2016   Procedure: INSERTION PACEMAKER;  Surgeon: Isaias Cowman, MD;  Location: [facility] OR[facility];  Service: Cardiovascular;  Laterality: Left;  . SKIN CANCER EXCISION    . TUBAL LIGATION    . VISCERAL ANGIOGRAPHY N/A 11/03/2019   Procedure: VISCERAL ANGIOGRAPHY;  Surgeon: Katha Cabal, MD;  Location: [place] [facility];  Service: Cardiovascular;  Laterality: N/A;    Prior to Admission medications   Medication Sig Start Date End Date Taking? Authorizing Provider  amoxicillin-clavulanate (AUGMENTIN) 875-125 MG tablet Take 1 tablet by mouth 2 (two) times daily for 7 days. 09/06/20 09/13/20 Yes Arta Silence, MD  oxymetazoline (AFRIN) 0.05 % nasal spray Place 1 spray into both  nostrils 2 (two) times daily for 3 days. 09/06/20 09/09/20 Yes Arta Silence, MD  amitriptyline (ELAVIL) 10 MG tablet Take 10 mg by mouth at bedtime. 08/14/20   [provider]  ammonium lactate (AMLACTIN) 12 % lotion Apply 1 application topically as needed for dry skin. Patient not taking: Reported on 08/16/2020 10/05/19   Felipa Furnace, DPM  aspirin EC 81 MG tablet Take 81 mg by mouth daily.     [provider]  buPROPion (WELLBUTRIN XL) 150 MG 24 hr tablet Take 150 mg by mouth daily.  Patient not taking: Reported on 08/16/2020 09/06/19   [provider]  carbidopa-levodopa (SINEMET IR) 25-100 MG tablet Take 1 tablet by mouth at bedtime. Patient not taking: Reported on 08/16/2020    [provider]  cefdinir (OMNICEF) 300 MG capsule Take 1 capsule (300 mg total) by mouth 2 (two) times daily. Patient not taking: Reported on 08/16/2020 10/05/18   Salary, Holly Bodily D, MD  Cholecalciferol (VITAMIN D3) 2000 units TABS Take 2,000 Units by mouth daily.    [provider]  clopidogrel (PLAVIX) 75 MG tablet Take 1 tablet (75 mg total) by mouth daily. 11/04/19   Schnier, Dolores Lory, MD  Continuous Blood Gluc Receiver (FREESTYLE LIBRE 14 DAY READER) DEVI Use 1 each as needed .  Use to test blood sugar 10/22/18   [provider]  Continuous Blood Gluc Sensor (FREESTYLE  LIBRE 14 DAY SENSOR) MISC Use 1 each every 14 (fourteen) days 10/22/18   [provider]  diazepam (VALIUM) 2 MG tablet Take 2 mg by mouth 2 (two) times daily. 07/25/20   [provider]  furosemide (LASIX) 20 MG tablet Take by mouth. Patient not taking: Reported on 08/16/2020 06/20/20 06/20/21  [provider]  HUMALOG KWIKPEN 100 UNIT/ML KiwkPen Inject 10 Units into the skin 3 (three) times daily. *Use as directed per sliding scale* 03/03/15   [provider]  insulin glargine (LANTUS) 100 UNIT/ML injection Inject 0.5 mLs (50 Units total) into the skin at  bedtime. Patient taking differently: Inject 12 Units into the skin at bedtime. 08/21/18   Bettey Costa, MD  levothyroxine (SYNTHROID, LEVOTHROID) 112 MCG tablet Take 1 tablet (112 mcg total) by mouth daily before breakfast. 06/17/16   Bettey Costa, MD  magnesium oxide (MAG-OX) 400 MG tablet Take 400 mg by mouth at bedtime.  09/24/19   [provider]  metFORMIN (GLUCOPHAGE-XR) 500 MG 24 hr tablet Take 2 tablets (1,000 mg total) by mouth 2 (two) times daily. Please start this on 8/24 08/21/18   Bettey Costa, MD  metoprolol succinate (TOPROL-XL) 25 MG 24 hr tablet Take 1 tablet (25 mg total) by mouth daily. 10/06/18   Salary, Avel Peace, MD  Multiple Vitamin (MULTIVITAMIN WITH MINERALS) TABS tablet Take 1 tablet by mouth daily. 09/24/17   Max Sane, MD  nitroGLYCERIN (NITROSTAT) 0.4 MG SL tablet Place 1 tablet (0.4 mg total) under the tongue every 5 (five) minutes as needed for chest pain. 04/24/16   Bettey Costa, MD  pantoprazole (PROTONIX) 40 MG tablet TAKE 1 TABLET BY MOUTH DAILY TAKE 15-20 MINS BEFORE A MEAL Patient not taking: Reported on 08/16/2020 05/22/20   [provider]  PARoxetine (PAXIL) 10 MG tablet Take 1 tablet by mouth daily. 04/20/20   [provider]  pioglitazone (ACTOS) 30 MG tablet Take by mouth. Patient not taking: Reported on 08/16/2020 04/14/20 04/14/21  [provider]  pramipexole (MIRAPEX) 0.5 MG tablet Take 0.5 mg by mouth at bedtime. 09/24/19   [provider]  rOPINIRole (REQUIP) 0.5 MG tablet Take 0.5 mg by mouth in the morning and at bedtime. Patient not taking: Reported on 08/16/2020    [provider]  rosuvastatin (CRESTOR) 10 MG tablet Take 2 tablets (20 mg total) by mouth at bedtime. Patient taking differently: Take 10 mg by mouth at bedtime. 08/19/18   Demetrios Loll, MD  solifenacin (VESICARE) 10 MG tablet Take by mouth. 04/14/20 04/14/21  [provider]  triamterene-hydrochlorothiazide (DYAZIDE) 37.5-25 MG capsule  Take 1 capsule by mouth daily.  09/24/19   [provider]    Allergies Patient has no known allergies.  Family History  Problem Relation Age of Onset  . Prostate cancer Neg Hx   . Kidney cancer Neg Hx   . Breast cancer Neg Hx     Social History Social History   Tobacco Use  . Smoking status: Former Smoker    Packs/day: 0.50    Types: Cigarettes    Quit date: 09/02/1996    Years since quitting: 24.0  . Smokeless tobacco: Never Used  Vaping Use  . Vaping Use: Never used  Substance Use Topics  . Alcohol use: Not Currently    Alcohol/week: 1.0 standard drink    Types: 1 Glasses of wine per week  . Drug use: No    Review of Systems  Constitutional: No fever/chills Eyes: No visual changes.  ENT: No neck pain. Cardiovascular: Denies chest pain. Respiratory: Denies shortness of breath. Gastrointestinal: No vomiting. Genitourinary: Negative for dysuria.  Musculoskeletal: Negative for back pain. Skin: Negative for rash. Neurological: Positive for headache   ____________________________________________   PHYSICAL EXAM:  VITAL SIGNS: ED Triage Vitals  Enc Vitals Group     BP 09/06/20 1337 (!) 110/49     Pulse Rate 09/06/20 1337 62     Resp 09/06/20 1337 20     Temp 09/06/20 1337 (!) 97.5 F (36.4 C)     Temp Source 09/06/20 1337 Oral     SpO2 09/06/20 1337 95 %     Weight --      Height --      Head Circumference --      Peak Flow --      Pain Score 09/06/20 1343 10     Pain Loc --      Pain Edu? --      Excl. in [place]? --     Constitutional: Alert and oriented. Well appearing and in no acute distress. Eyes: Conjunctivae are normal.  EOMI.  Para light. Head: Nasal swelling and bruising.  1 cm superficial laceration to the nasal bridge. Nose: Nasal bone tender bilaterally.  Bilateral nares with some blood but no active hemorrhage.  No septal hematoma. Mouth/Throat: Mucous membranes are moist.  Posterior oropharynx with some dried blood but otherwise  clear. Neck: Normal range of motion.  Cardiovascular: Normal rate, regular rhythm.  Good peripheral circulation. Respiratory: Normal respiratory effort.  No retractions.  Gastrointestinal:  No distention.  Musculoskeletal: Extremities warm and well perfused.  Neurologic:  Normal speech and language.  Motor intact in all extremities.  Normal gait.  No gross focal neurologic deficits are appreciated.  Skin:  Skin is warm and dry. No rash noted. Psychiatric: Mood and affect are normal. Speech and behavior are normal.  ____________________________________________   LABS (all labs ordered are listed, but only abnormal results are displayed)  Labs Reviewed - No data to display ____________________________________________  EKG   ____________________________________________  RADIOLOGY  CT head: No ICH or other acute abnormality CT maxillofacial bilateral nasal bone fractures and nasal septal fracture CT cervical spine: No acute fracture  ____________________________________________   PROCEDURES  Procedure(s) performed: Yes  .Marland KitchenLaceration Repair  Date/Time: 09/06/2020 6:21 PM Performed by: Arta Silence, MD Authorized by: Arta Silence, MD   Consent:    Consent obtained:  Verbal   Consent given by:  Patient Anesthesia:    Anesthesia method:  None Laceration details:    Location:  Face   Face location:  Nose   Length (cm):  1   Depth (mm):  2 Exploration:    Contaminated: no   Treatment:    Amount of cleaning:  Standard   Visualized foreign bodies/material removed: no   Skin repair:    Repair method:  Tissue adhesive Approximation:    Approximation:  Close Repair type:    Repair type:  Simple Post-procedure details:    Dressing:  Open (no dressing)   Procedure completion:  Tolerated well, no immediate complications    Critical Care performed: No ____________________________________________   INITIAL IMPRESSION / ASSESSMENT AND PLAN / ED  COURSE  Pertinent labs & imaging results that were available during my care of the patient were reviewed by me and considered in my medical decision making (see chart for details).  86 year old female with PMH as noted above including CAD on Plavix and aspirin presents with a nasal injury after  a mechanical fall from standing height.  The patient denies LOC.  She has no other areas of injury.  On exam, her vital signs are normal.  Neurologic exam is nonfocal.  She has ecchymosis and swelling to the nasal bridge with a 1 cm superficial laceration.  She had some epistaxis earlier which has almost completely resolved and there is a small amount of blood in the nares but no active hemorrhage.  There is no evidence of septal hematoma.  CTs were obtained from triage and reveal nasal bone and septal fractures, minimally displaced.  CT head and cervical spine are both negative for acute traumatic findings.  At this time, the patient does not require further work-up.  The epistaxis has almost completely resolved with Afrin.  We will prescribe antibiotics and plan for ENT follow-up.  I discussed the case with Dr. Richardson Landry from ENT who agrees with the above plan.  Return precautions given, the patient expresses understanding. ____________________________________________   FINAL CLINICAL IMPRESSION(S) / ED DIAGNOSES  Final diagnoses:  Closed fracture of nasal bone, initial encounter  Minor head injury, initial encounter      NEW MEDICATIONS STARTED DURING THIS VISIT:  New Prescriptions   AMOXICILLIN-CLAVULANATE (AUGMENTIN) 875-125 MG TABLET    Take 1 tablet by mouth 2 (two) times daily for 7 days.   OXYMETAZOLINE (AFRIN) 0.05 % NASAL SPRAY    Place 1 spray into both nostrils 2 (two) times daily for 3 days.     Note:  This document was prepared using Dragon voice recognition software and may include unintentional dictation errors.   Arta Silence, MD 09/06/20 [phone]

## 2020-09-25 ENCOUNTER — Other Ambulatory Visit (INDEPENDENT_AMBULATORY_CARE_PROVIDER_SITE_OTHER): Payer: Self-pay | Admitting: Vascular Surgery

## 2020-09-27 ENCOUNTER — Other Ambulatory Visit: Payer: Self-pay

## 2020-09-27 ENCOUNTER — Emergency Department: Payer: Medicare Other

## 2020-09-27 ENCOUNTER — Encounter: Payer: Self-pay | Admitting: Emergency Medicine

## 2020-09-27 DIAGNOSIS — Z9842 Cataract extraction status, left eye: Secondary | ICD-10-CM

## 2020-09-27 DIAGNOSIS — R296 Repeated falls: Secondary | ICD-10-CM | POA: Diagnosis present

## 2020-09-27 DIAGNOSIS — U071 COVID-19: Principal | ICD-10-CM | POA: Diagnosis present

## 2020-09-27 DIAGNOSIS — I2510 Atherosclerotic heart disease of native coronary artery without angina pectoris: Secondary | ICD-10-CM | POA: Diagnosis present

## 2020-09-27 DIAGNOSIS — Y92009 Unspecified place in unspecified non-institutional (private) residence as the place of occurrence of the external cause: Secondary | ICD-10-CM

## 2020-09-27 DIAGNOSIS — E860 Dehydration: Secondary | ICD-10-CM | POA: Diagnosis present

## 2020-09-27 DIAGNOSIS — G2581 Restless legs syndrome: Secondary | ICD-10-CM | POA: Diagnosis present

## 2020-09-27 DIAGNOSIS — Z794 Long term (current) use of insulin: Secondary | ICD-10-CM

## 2020-09-27 DIAGNOSIS — Y9301 Activity, walking, marching and hiking: Secondary | ICD-10-CM | POA: Diagnosis present

## 2020-09-27 DIAGNOSIS — W19XXXA Unspecified fall, initial encounter: Secondary | ICD-10-CM | POA: Diagnosis present

## 2020-09-27 DIAGNOSIS — Z79899 Other long term (current) drug therapy: Secondary | ICD-10-CM

## 2020-09-27 DIAGNOSIS — E1165 Type 2 diabetes mellitus with hyperglycemia: Secondary | ICD-10-CM | POA: Diagnosis not present

## 2020-09-27 DIAGNOSIS — E039 Hypothyroidism, unspecified: Secondary | ICD-10-CM | POA: Diagnosis present

## 2020-09-27 DIAGNOSIS — S0011XA Contusion of right eyelid and periocular area, initial encounter: Secondary | ICD-10-CM | POA: Diagnosis present

## 2020-09-27 DIAGNOSIS — Y92239 Unspecified place in hospital as the place of occurrence of the external cause: Secondary | ICD-10-CM | POA: Diagnosis not present

## 2020-09-27 DIAGNOSIS — E7800 Pure hypercholesterolemia, unspecified: Secondary | ICD-10-CM | POA: Diagnosis present

## 2020-09-27 DIAGNOSIS — Z7902 Long term (current) use of antithrombotics/antiplatelets: Secondary | ICD-10-CM

## 2020-09-27 DIAGNOSIS — J9601 Acute respiratory failure with hypoxia: Secondary | ICD-10-CM | POA: Diagnosis present

## 2020-09-27 DIAGNOSIS — Z79890 Hormone replacement therapy (postmenopausal): Secondary | ICD-10-CM

## 2020-09-27 DIAGNOSIS — G4730 Sleep apnea, unspecified: Secondary | ICD-10-CM | POA: Diagnosis present

## 2020-09-27 DIAGNOSIS — Z9841 Cataract extraction status, right eye: Secondary | ICD-10-CM

## 2020-09-27 DIAGNOSIS — I252 Old myocardial infarction: Secondary | ICD-10-CM

## 2020-09-27 DIAGNOSIS — Z7982 Long term (current) use of aspirin: Secondary | ICD-10-CM

## 2020-09-27 DIAGNOSIS — J9811 Atelectasis: Secondary | ICD-10-CM | POA: Diagnosis present

## 2020-09-27 DIAGNOSIS — Z87442 Personal history of urinary calculi: Secondary | ICD-10-CM

## 2020-09-27 DIAGNOSIS — Z950 Presence of cardiac pacemaker: Secondary | ICD-10-CM

## 2020-09-27 DIAGNOSIS — E1151 Type 2 diabetes mellitus with diabetic peripheral angiopathy without gangrene: Secondary | ICD-10-CM | POA: Diagnosis present

## 2020-09-27 DIAGNOSIS — J440 Chronic obstructive pulmonary disease with acute lower respiratory infection: Secondary | ICD-10-CM | POA: Diagnosis present

## 2020-09-27 DIAGNOSIS — J441 Chronic obstructive pulmonary disease with (acute) exacerbation: Secondary | ICD-10-CM | POA: Diagnosis present

## 2020-09-27 DIAGNOSIS — R42 Dizziness and giddiness: Secondary | ICD-10-CM | POA: Diagnosis present

## 2020-09-27 DIAGNOSIS — E538 Deficiency of other specified B group vitamins: Secondary | ICD-10-CM | POA: Diagnosis present

## 2020-09-27 DIAGNOSIS — T380X5A Adverse effect of glucocorticoids and synthetic analogues, initial encounter: Secondary | ICD-10-CM | POA: Diagnosis not present

## 2020-09-27 DIAGNOSIS — G4710 Hypersomnia, unspecified: Secondary | ICD-10-CM | POA: Diagnosis present

## 2020-09-27 DIAGNOSIS — Z87891 Personal history of nicotine dependence: Secondary | ICD-10-CM

## 2020-09-27 DIAGNOSIS — F32A Depression, unspecified: Secondary | ICD-10-CM | POA: Diagnosis present

## 2020-09-27 DIAGNOSIS — I10 Essential (primary) hypertension: Secondary | ICD-10-CM | POA: Diagnosis present

## 2020-09-27 DIAGNOSIS — J1282 Pneumonia due to coronavirus disease 2019: Secondary | ICD-10-CM | POA: Diagnosis present

## 2020-09-27 DIAGNOSIS — Z853 Personal history of malignant neoplasm of breast: Secondary | ICD-10-CM

## 2020-09-27 DIAGNOSIS — I495 Sick sinus syndrome: Secondary | ICD-10-CM | POA: Diagnosis present

## 2020-09-27 DIAGNOSIS — Z9010 Acquired absence of unspecified breast and nipple: Secondary | ICD-10-CM

## 2020-09-27 DIAGNOSIS — Z85828 Personal history of other malignant neoplasm of skin: Secondary | ICD-10-CM

## 2020-09-27 DIAGNOSIS — F419 Anxiety disorder, unspecified: Secondary | ICD-10-CM | POA: Diagnosis present

## 2020-09-27 DIAGNOSIS — E785 Hyperlipidemia, unspecified: Secondary | ICD-10-CM | POA: Diagnosis present

## 2020-09-27 LAB — CBC
HCT: 33.8 % — ABNORMAL LOW (ref 36.0–46.0)
Hemoglobin: 10.8 g/dL — ABNORMAL LOW (ref 12.0–15.0)
MCH: 28.1 pg (ref 26.0–34.0)
MCHC: 32 g/dL (ref 30.0–36.0)
MCV: 87.8 fL (ref 80.0–100.0)
Platelets: 368 10*3/uL (ref 150–400)
RBC: 3.85 MIL/uL — ABNORMAL LOW (ref 3.87–5.11)
RDW: 15.9 % — ABNORMAL HIGH (ref 11.5–15.5)
WBC: 7.8 10*3/uL (ref 4.0–10.5)
nRBC: 0 % (ref 0.0–0.2)

## 2020-09-27 LAB — BASIC METABOLIC PANEL
Anion gap: 12 (ref 5–15)
BUN: 38 mg/dL — ABNORMAL HIGH (ref 8–23)
CO2: 24 mmol/L (ref 22–32)
Calcium: 9.2 mg/dL (ref 8.9–10.3)
Chloride: 103 mmol/L (ref 98–111)
Creatinine, Ser: 1.58 mg/dL — ABNORMAL HIGH (ref 0.44–1.00)
GFR, Estimated: 32 mL/min — ABNORMAL LOW (ref >60)
Glucose, Bld: 163 mg/dL — ABNORMAL HIGH (ref 70–99)
Potassium: 4.8 mmol/L (ref 3.5–5.1)
Sodium: 139 mmol/L (ref 135–145)

## 2020-09-27 LAB — CBG MONITORING, ED: Glucose-Capillary: 143 mg/dL — ABNORMAL HIGH (ref 70–99)

## 2020-09-27 NOTE — ED Triage Notes (Signed)
Pt comes into the ED via POV c/o dizziness today.  Pt had a fall on Monday where she hit her head and has a laceration over the right eye.  Pt was not checked out from the fall on Monday and now comes in today with increased dizziness.  Pt is currently on Eliquis.  Pt states she feels as though she is going to have a syncopal episode today from how dizzy she is.  Pt has even and unlabored respirations at this time. Pt states that when her eyes are open "everything is moving".

## 2020-09-28 ENCOUNTER — Other Ambulatory Visit: Payer: Self-pay

## 2020-09-28 ENCOUNTER — Emergency Department: Payer: Medicare Other

## 2020-09-28 ENCOUNTER — Inpatient Hospital Stay
Admission: EM | Admit: 2020-09-28 | Discharge: 2020-09-30 | DRG: 177 | Disposition: A | Discharge status: Home Health | Payer: Medicare Other | Attending: Internal Medicine | Admitting: Internal Medicine

## 2020-09-28 DIAGNOSIS — I10 Essential (primary) hypertension: Secondary | ICD-10-CM | POA: Diagnosis present

## 2020-09-28 DIAGNOSIS — Y92239 Unspecified place in hospital as the place of occurrence of the external cause: Secondary | ICD-10-CM | POA: Diagnosis not present

## 2020-09-28 DIAGNOSIS — E860 Dehydration: Secondary | ICD-10-CM | POA: Diagnosis present

## 2020-09-28 DIAGNOSIS — J9811 Atelectasis: Secondary | ICD-10-CM | POA: Diagnosis present

## 2020-09-28 DIAGNOSIS — R42 Dizziness and giddiness: Secondary | ICD-10-CM | POA: Diagnosis present

## 2020-09-28 DIAGNOSIS — J440 Chronic obstructive pulmonary disease with acute lower respiratory infection: Secondary | ICD-10-CM | POA: Diagnosis present

## 2020-09-28 DIAGNOSIS — E538 Deficiency of other specified B group vitamins: Secondary | ICD-10-CM | POA: Diagnosis present

## 2020-09-28 DIAGNOSIS — U071 COVID-19: Secondary | ICD-10-CM | POA: Diagnosis present

## 2020-09-28 DIAGNOSIS — I252 Old myocardial infarction: Secondary | ICD-10-CM | POA: Diagnosis not present

## 2020-09-28 DIAGNOSIS — R296 Repeated falls: Secondary | ICD-10-CM | POA: Diagnosis present

## 2020-09-28 DIAGNOSIS — J1282 Pneumonia due to coronavirus disease 2019: Secondary | ICD-10-CM | POA: Diagnosis present

## 2020-09-28 DIAGNOSIS — I495 Sick sinus syndrome: Secondary | ICD-10-CM | POA: Diagnosis present

## 2020-09-28 DIAGNOSIS — J441 Chronic obstructive pulmonary disease with (acute) exacerbation: Secondary | ICD-10-CM | POA: Diagnosis present

## 2020-09-28 DIAGNOSIS — T380X5A Adverse effect of glucocorticoids and synthetic analogues, initial encounter: Secondary | ICD-10-CM | POA: Diagnosis not present

## 2020-09-28 DIAGNOSIS — R531 Weakness: Secondary | ICD-10-CM

## 2020-09-28 DIAGNOSIS — E785 Hyperlipidemia, unspecified: Secondary | ICD-10-CM | POA: Diagnosis present

## 2020-09-28 DIAGNOSIS — E7800 Pure hypercholesterolemia, unspecified: Secondary | ICD-10-CM | POA: Diagnosis present

## 2020-09-28 DIAGNOSIS — R55 Syncope and collapse: Secondary | ICD-10-CM | POA: Diagnosis not present

## 2020-09-28 DIAGNOSIS — G2581 Restless legs syndrome: Secondary | ICD-10-CM | POA: Diagnosis present

## 2020-09-28 DIAGNOSIS — E039 Hypothyroidism, unspecified: Secondary | ICD-10-CM

## 2020-09-28 DIAGNOSIS — J9601 Acute respiratory failure with hypoxia: Secondary | ICD-10-CM | POA: Diagnosis present

## 2020-09-28 DIAGNOSIS — E1165 Type 2 diabetes mellitus with hyperglycemia: Secondary | ICD-10-CM | POA: Diagnosis not present

## 2020-09-28 DIAGNOSIS — W19XXXA Unspecified fall, initial encounter: Secondary | ICD-10-CM | POA: Diagnosis present

## 2020-09-28 DIAGNOSIS — F32A Depression, unspecified: Secondary | ICD-10-CM | POA: Diagnosis present

## 2020-09-28 DIAGNOSIS — F419 Anxiety disorder, unspecified: Secondary | ICD-10-CM | POA: Diagnosis present

## 2020-09-28 DIAGNOSIS — S0011XA Contusion of right eyelid and periocular area, initial encounter: Secondary | ICD-10-CM | POA: Diagnosis present

## 2020-09-28 DIAGNOSIS — Z950 Presence of cardiac pacemaker: Secondary | ICD-10-CM | POA: Diagnosis not present

## 2020-09-28 DIAGNOSIS — I2510 Atherosclerotic heart disease of native coronary artery without angina pectoris: Secondary | ICD-10-CM | POA: Diagnosis present

## 2020-09-28 DIAGNOSIS — Y9301 Activity, walking, marching and hiking: Secondary | ICD-10-CM | POA: Diagnosis present

## 2020-09-28 DIAGNOSIS — Y92009 Unspecified place in unspecified non-institutional (private) residence as the place of occurrence of the external cause: Secondary | ICD-10-CM | POA: Diagnosis not present

## 2020-09-28 LAB — HEMOGLOBIN A1C
Hgb A1c MFr Bld: 8.2 % — ABNORMAL HIGH (ref 4.8–5.6)
Mean Plasma Glucose: 188.64 mg/dL

## 2020-09-28 LAB — LACTATE DEHYDROGENASE: LDH: 139 U/L (ref 98–192)

## 2020-09-28 LAB — URINALYSIS, COMPLETE (UACMP) WITH MICROSCOPIC
Bilirubin Urine: NEGATIVE
Glucose, UA: 50 mg/dL — AB
Hgb urine dipstick: NEGATIVE
Ketones, ur: NEGATIVE mg/dL
Leukocytes,Ua: NEGATIVE
Nitrite: NEGATIVE
Protein, ur: NEGATIVE mg/dL
Specific Gravity, Urine: 1.013 (ref 1.005–1.030)
Squamous Epithelial / HPF: NONE SEEN (ref 0–5)
pH: 5 (ref 5.0–8.0)

## 2020-09-28 LAB — FIBRINOGEN: Fibrinogen: 442 mg/dL (ref 210–475)

## 2020-09-28 LAB — CBG MONITORING, ED
Glucose-Capillary: 126 mg/dL — ABNORMAL HIGH (ref 70–99)
Glucose-Capillary: 223 mg/dL — ABNORMAL HIGH (ref 70–99)

## 2020-09-28 LAB — SARS CORONAVIRUS 2 BY RT PCR (HOSPITAL ORDER, PERFORMED IN ~~LOC~~ HOSPITAL LAB): SARS Coronavirus 2: POSITIVE — AB

## 2020-09-28 LAB — TROPONIN I (HIGH SENSITIVITY)
Troponin I (High Sensitivity): 5 ng/L (ref <18)
Troponin I (High Sensitivity): 5 ng/L (ref <18)
Troponin I (High Sensitivity): 5 ng/L (ref <18)

## 2020-09-28 LAB — GLUCOSE, CAPILLARY
Glucose-Capillary: 203 mg/dL — ABNORMAL HIGH (ref 70–99)
Glucose-Capillary: 156 mg/dL — ABNORMAL HIGH (ref 70–99)

## 2020-09-28 LAB — FERRITIN: Ferritin: 89 ng/mL (ref 11–307)

## 2020-09-28 LAB — FIBRIN DERIVATIVES D-DIMER (ARMC ONLY): Fibrin derivatives D-dimer (ARMC): 1234.71 ng/mL (FEU) — ABNORMAL HIGH (ref 0.00–499.00)

## 2020-09-28 LAB — C-REACTIVE PROTEIN: CRP: <0.5 mg/dL (ref <1.0)

## 2020-09-28 LAB — TSH: TSH: 0.44 u[IU]/mL (ref 0.350–4.500)

## 2020-09-28 LAB — PROCALCITONIN: Procalcitonin: <0.1 ng/mL

## 2020-09-28 MED ORDER — INSULIN ASPART 100 UNIT/ML ~~LOC~~ SOLN
0.0000 [IU] | Freq: Three times a day (TID) | SUBCUTANEOUS | Status: DC
  Administered 2020-09-28: 5 [IU] via SUBCUTANEOUS
  Administered 2020-09-28: 2 [IU] via SUBCUTANEOUS
  Administered 2020-09-28: 19:00:00 5 [IU] via SUBCUTANEOUS
  Administered 2020-09-28: 3 [IU] via SUBCUTANEOUS
  Administered 2020-09-29 (×2): 11 [IU] via SUBCUTANEOUS
  Filled 2020-09-28 (×5): qty 1

## 2020-09-28 MED ORDER — ENOXAPARIN SODIUM 40 MG/0.4ML ~~LOC~~ SOLN: 40.0000 mg | SUBCUTANEOUS | Status: DC

## 2020-09-28 MED ORDER — ONDANSETRON HCL 4 MG PO TABS: 4.0000 mg | ORAL_TABLET | Freq: Four times a day (QID) | ORAL | Status: DC | PRN

## 2020-09-28 MED ORDER — SODIUM CHLORIDE 0.9 % IV SOLN
100.0000 mg | Freq: Every day | INTRAVENOUS | Status: DC
  Administered 2020-09-29 – 2020-09-30 (×2): 100 mg via INTRAVENOUS
  Filled 2020-09-28 (×2): qty 20

## 2020-09-28 MED ORDER — ADULT MULTIVITAMIN W/MINERALS CH
1.0000 | ORAL_TABLET | Freq: Every day | ORAL | Status: DC
  Administered 2020-09-28 – 2020-09-30 (×3): 1 via ORAL
  Filled 2020-09-28 (×3): qty 1

## 2020-09-28 MED ORDER — ASPIRIN EC 81 MG PO TBEC: 81.0000 mg | DELAYED_RELEASE_TABLET | Freq: Every day | ORAL | Status: DC

## 2020-09-28 MED ORDER — ACETAMINOPHEN 325 MG PO TABS: 650.0000 mg | ORAL_TABLET | Freq: Four times a day (QID) | ORAL | Status: DC | PRN

## 2020-09-28 MED ORDER — ENOXAPARIN SODIUM 30 MG/0.3ML ~~LOC~~ SOLN
30.0000 mg | SUBCUTANEOUS | Status: DC
  Administered 2020-09-28 – 2020-09-29 (×2): 30 mg via SUBCUTANEOUS
  Filled 2020-09-28 (×2): qty 0.3

## 2020-09-28 MED ORDER — DARIFENACIN HYDROBROMIDE ER 7.5 MG PO TB24
7.5000 mg | ORAL_TABLET | Freq: Every day | ORAL | Status: DC
  Administered 2020-09-28 – 2020-09-30 (×3): 7.5 mg via ORAL
  Filled 2020-09-28 (×3): qty 1

## 2020-09-28 MED ORDER — AMMONIUM LACTATE 12 % EX LOTN
1.0000 "application " | TOPICAL_LOTION | CUTANEOUS | Status: DC | PRN
  Filled 2020-09-28: qty 400

## 2020-09-28 MED ORDER — ASCORBIC ACID 500 MG PO TABS
500.0000 mg | ORAL_TABLET | Freq: Every day | ORAL | Status: DC
  Administered 2020-09-28 – 2020-09-30 (×3): 500 mg via ORAL
  Filled 2020-09-28 (×3): qty 1

## 2020-09-28 MED ORDER — SODIUM CHLORIDE 0.9 % IV SOLN
200.0000 mg | Freq: Once | INTRAVENOUS | Status: AC
  Administered 2020-09-28: 200 mg via INTRAVENOUS
  Filled 2020-09-28: qty 40

## 2020-09-28 MED ORDER — METHYLPREDNISOLONE SODIUM SUCC 125 MG IJ SOLR: 1.0000 mg/kg | Freq: Once | INTRAMUSCULAR | Status: DC

## 2020-09-28 MED ORDER — GUAIFENESIN ER 600 MG PO TB12
600.0000 mg | ORAL_TABLET | Freq: Two times a day (BID) | ORAL | Status: DC
  Administered 2020-09-28 – 2020-09-30 (×5): 600 mg via ORAL
  Filled 2020-09-28 (×5): qty 1

## 2020-09-28 MED ORDER — ONDANSETRON HCL 4 MG/2ML IJ SOLN: 4.0000 mg | Freq: Four times a day (QID) | INTRAMUSCULAR | Status: DC | PRN

## 2020-09-28 MED ORDER — PREDNISONE 50 MG PO TABS: 50.0000 mg | ORAL_TABLET | Freq: Every day | ORAL | Status: DC

## 2020-09-28 MED ORDER — PAROXETINE HCL 10 MG PO TABS
10.0000 mg | ORAL_TABLET | Freq: Every day | ORAL | Status: DC
  Administered 2020-09-28 – 2020-09-30 (×3): 10 mg via ORAL
  Filled 2020-09-28 (×3): qty 1

## 2020-09-28 MED ORDER — METHYLPREDNISOLONE SODIUM SUCC 125 MG IJ SOLR
1.0000 mg/kg | Freq: Two times a day (BID) | INTRAMUSCULAR | Status: AC
  Administered 2020-09-28 – 2020-09-30 (×6): 68.75 mg via INTRAVENOUS
  Filled 2020-09-28 (×6): qty 2

## 2020-09-28 MED ORDER — SODIUM CHLORIDE 0.9 % IV BOLUS (SEPSIS): 1000.0000 mL | Freq: Once | INTRAVENOUS | Status: DC

## 2020-09-28 MED ORDER — SODIUM CHLORIDE 0.9 % IV SOLN: INTRAVENOUS | Status: DC

## 2020-09-28 MED ORDER — ROSUVASTATIN CALCIUM 10 MG PO TABS
10.0000 mg | ORAL_TABLET | Freq: Every day | ORAL | Status: DC
Start: 2020-09-28 — End: 2020-09-30
  Administered 2020-09-28 – 2020-09-29 (×2): 10 mg via ORAL
  Filled 2020-09-28 (×3): qty 1

## 2020-09-28 MED ORDER — NITROGLYCERIN 0.4 MG SL SUBL: 0.4000 mg | SUBLINGUAL_TABLET | SUBLINGUAL | Status: DC | PRN

## 2020-09-28 MED ORDER — TRAZODONE HCL 50 MG PO TABS: 25.0000 mg | ORAL_TABLET | Freq: Every evening | ORAL | Status: DC | PRN

## 2020-09-28 MED ORDER — CLOPIDOGREL BISULFATE 75 MG PO TABS
75.0000 mg | ORAL_TABLET | Freq: Every day | ORAL | Status: DC
Start: 2020-09-28 — End: 2020-09-30
  Administered 2020-09-28 – 2020-09-30 (×3): 75 mg via ORAL
  Filled 2020-09-28 (×3): qty 1

## 2020-09-28 MED ORDER — FAMOTIDINE 20 MG PO TABS
10.0000 mg | ORAL_TABLET | Freq: Every day | ORAL | Status: DC
  Administered 2020-09-28 – 2020-09-30 (×3): 10 mg via ORAL
  Filled 2020-09-28 (×3): qty 1

## 2020-09-28 MED ORDER — ZINC SULFATE 220 (50 ZN) MG PO CAPS
220.0000 mg | ORAL_CAPSULE | Freq: Every day | ORAL | Status: DC
  Administered 2020-09-28 – 2020-09-30 (×3): 220 mg via ORAL
  Filled 2020-09-28 (×3): qty 1

## 2020-09-28 MED ORDER — PRAMIPEXOLE DIHYDROCHLORIDE 1 MG PO TABS
0.5000 mg | ORAL_TABLET | Freq: Every day | ORAL | Status: DC
  Administered 2020-09-28: 22:00:00 0.5 mg via ORAL
  Filled 2020-09-28: qty 2
  Filled 2020-09-28: qty 1

## 2020-09-28 MED ORDER — MAGNESIUM HYDROXIDE 400 MG/5ML PO SUSP
30.0000 mL | Freq: Every day | ORAL | Status: DC | PRN
  Filled 2020-09-28: qty 30

## 2020-09-28 MED ORDER — HYDROCOD POLST-CPM POLST ER 10-8 MG/5ML PO SUER: 5.0000 mL | Freq: Two times a day (BID) | ORAL | Status: DC | PRN

## 2020-09-28 MED ORDER — VITAMIN D3 50 MCG (2000 UT) PO TABS: 2000.0000 [IU] | ORAL_TABLET | Freq: Every day | ORAL | Status: DC

## 2020-09-28 MED ORDER — INSULIN GLARGINE 100 UNIT/ML ~~LOC~~ SOLN
12.0000 [IU] | Freq: Every day | SUBCUTANEOUS | Status: DC
  Administered 2020-09-28 – 2020-09-29 (×2): 12 [IU] via SUBCUTANEOUS
  Filled 2020-09-28 (×4): qty 0.12

## 2020-09-28 MED ORDER — FAMOTIDINE 20 MG PO TABS: 20.0000 mg | ORAL_TABLET | Freq: Two times a day (BID) | ORAL | Status: DC

## 2020-09-28 MED ORDER — GUAIFENESIN-DM 100-10 MG/5ML PO SYRP: 10.0000 mL | ORAL_SOLUTION | ORAL | Status: DC | PRN

## 2020-09-28 MED ORDER — DIAZEPAM 2 MG PO TABS
2.0000 mg | ORAL_TABLET | Freq: Two times a day (BID) | ORAL | Status: DC
  Administered 2020-09-28 – 2020-09-30 (×5): 2 mg via ORAL
  Filled 2020-09-28 (×5): qty 1

## 2020-09-28 MED ORDER — CARBIDOPA-LEVODOPA 25-100 MG PO TABS
1.0000 | ORAL_TABLET | Freq: Every day | ORAL | Status: DC
  Administered 2020-09-28: 1 via ORAL
  Filled 2020-09-28 (×3): qty 1

## 2020-09-28 MED ORDER — ASPIRIN EC 81 MG PO TBEC
81.0000 mg | DELAYED_RELEASE_TABLET | Freq: Every day | ORAL | Status: DC
  Administered 2020-09-28 – 2020-09-30 (×3): 81 mg via ORAL
  Filled 2020-09-28 (×3): qty 1

## 2020-09-28 MED ORDER — MECLIZINE HCL 25 MG PO TABS
25.0000 mg | ORAL_TABLET | Freq: Once | ORAL | Status: AC
  Administered 2020-09-28: 25 mg via ORAL
  Filled 2020-09-28: qty 1

## 2020-09-28 MED ORDER — METOPROLOL SUCCINATE ER 25 MG PO TB24
25.0000 mg | ORAL_TABLET | Freq: Every day | ORAL | Status: DC
  Administered 2020-09-29 – 2020-09-30 (×2): 25 mg via ORAL
  Filled 2020-09-28 (×3): qty 1

## 2020-09-28 MED ORDER — MAGNESIUM OXIDE 400 MG PO TABS
400.0000 mg | ORAL_TABLET | Freq: Every day | ORAL | Status: DC
  Administered 2020-09-28 – 2020-09-29 (×2): 400 mg via ORAL
  Filled 2020-09-28 (×5): qty 1

## 2020-09-28 MED ORDER — LEVOTHYROXINE SODIUM 112 MCG PO TABS
112.0000 ug | ORAL_TABLET | Freq: Every day | ORAL | Status: DC
  Administered 2020-09-28 – 2020-09-30 (×2): 112 ug via ORAL
  Filled 2020-09-28 (×5): qty 1

## 2020-09-28 MED ORDER — VITAMIN D 25 MCG (1000 UNIT) PO TABS
1000.0000 [IU] | ORAL_TABLET | Freq: Every day | ORAL | Status: DC
  Administered 2020-09-28 – 2020-09-30 (×3): 1000 [IU] via ORAL
  Filled 2020-09-28 (×3): qty 1

## 2020-09-28 MED ORDER — TRIAMTERENE-HCTZ 37.5-25 MG PO TABS
1.0000 | ORAL_TABLET | Freq: Every day | ORAL | Status: DC
  Administered 2020-09-28 – 2020-09-30 (×3): 1 via ORAL
  Filled 2020-09-28 (×3): qty 1

## 2020-09-28 NOTE — ED Notes (Signed)
HUSBAND: HOME- [phone] CELL- [phone]

## 2020-09-28 NOTE — Progress Notes (Signed)
PHARMACIST - PHYSICIAN COMMUNICATION  CONCERNING:  Enoxaparin (Lovenox) for DVT Prophylaxis    RECOMMENDATION: Patient was prescribed enoxaprin 40mg  q24 hours for VTE prophylaxis.   Filed Weights   09/27/20 1228  Weight: 68.9 kg (152 lb)    Body mass index is 27.8 kg/m.  Estimated Creatinine Clearance: 23.2 mL/min (A) (by C-G formula based on SCr of 1.58 mg/dL (H)).   Patient is candidate for enoxaparin 30mg  every 24 hours based on CrCl <28ml/min or Weight <45kg  DESCRIPTION: Pharmacy has adjusted enoxaparin dose per [facility] policy.  Patient is now receiving enoxaparin 30 mg every 24 hours   Renda Rolls, PharmD, [facility] 09/28/2020 3:25 AM

## 2020-09-28 NOTE — ED Notes (Signed)
Pt given meal tray.

## 2020-09-28 NOTE — ED Notes (Signed)
Pt given breakfast; requesting to take all of her morning mediations with breakfast.

## 2020-09-28 NOTE — ED Provider Notes (Addendum)
[facility] Emergency Department Provider Note  ____________________________________________   Event Date/Time   First MD Initiated Contact with Patient 09/28/20 0034     (approximate)  I have reviewed the triage vital signs and the nursing notes.   HISTORY  Chief Complaint Near Syncope    HPI Cassandra [name] is a 87 y.o. female with history of hypertension, diabetes, hyperlipidemia, hypothyroidism, a flutter, sick sinus syndrome status post pacemaker, PAD on Plavix who presents to the emergency department with lightheadedness and vertigo that started at 11 AM yesterday.  She reports on Monday 09/25/20 she was walking in her house and her walker got caught on a rug causing her to fall.  She did hit her head and has a small laceration just underneath the right eyebrow.  No loss of consciousness.  States her tetanus vaccination is up-to-date.  Reports she was feeling fine until 11 AM yesterday when she started to feel like she was going to pass out.  Describes it as feeling lightheaded and like everything is moving.  States she feels weak all over.  No fevers, cough, chest pain but does feel short of breath.  No numbness or focal weakness.  No nausea or vomiting.  States she has had diarrhea.  She has been vaccinated for COVID-19.  No known sick contacts.  Ambulates with a walker at baseline.  No other injury.        Past Medical History:  Diagnosis Date  . Acquired hypothyroidism 01/06/2014  . Anxiety   . Arthritis   . B12 deficiency 02/15/2014  . Benign essential hypertension 01/06/2014  . Breast cancer [facility]) 1982   Right breast cancer - chemotherapy  . CAD (coronary artery disease), native coronary artery 01/06/2014  . Carotid artery calcification   . Chronic airway obstruction, not elsewhere classified 01/06/2014  . Depression   . Diabetes mellitus without complication ([place])   . History of kidney stones 2019   left ureteral stone  . Incontinence of urine    . Myocardial infarction ([place]) 1982   small infarct  . Neuromuscular disorder (HCC)    restless legs  . Pacemaker   . Presence of permanent cardiac pacemaker 07/2016  . Pure hypercholesterolemia 01/06/2014  . Renal insufficiency   . Skin cancer   . Sleep apnea    uses cpap    Patient Active Problem List   Diagnosis Date Noted  . Urge incontinence 07/16/2020  . History of nephrolithiasis 07/16/2020  . 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 Acute vomiting 11/01/2019  . Dizziness 11/01/2019  . Falls frequently 11/01/2019  . Visual hallucinations 11/01/2019  . Loss of weight 10/21/2019  . Carotid stenosis 10/20/2019  . Pacemaker 10/08/2019  . Moderate episode of recurrent major depressive disorder ([place]) 12/14/2018  . Near syncope 10/04/2018  . Non-STEMI (non-ST elevated myocardial infarction) ([place]) 08/18/2018  . Abnormal UGI series 03/31/2018  . Lymphedema 03/01/2018  . Acute respiratory failure with hypoxia ([place]) 09/16/2017  . Sepsis ([place]) 08/17/2017  . UTI (urinary tract infection) 08/17/2017  . Acute respiratory distress 08/17/2017  . 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 Sick sinus syndrome ([place]) 07/03/2016  . New onset atrial flutter ([place]) 06/14/2016  . Bradycardia, sinus 06/07/2016  . Syncope 06/06/2016  . Chest pain 04/21/2016  . Elevated troponin 04/21/2016  . Labile hypertension 04/21/2016  . Ischemic chest pain ([place]) 04/21/2016  . Long-term insulin use ([place]) 05/24/2014  . Mild vitamin D deficiency 05/20/2014  . Vitamin  D deficiency 05/20/2014  . B12 deficiency 02/15/2014  . Acquired hypothyroidism 01/06/2014  . Chronic airway obstruction, not elsewhere classified 01/06/2014  . CAD (coronary artery disease), native coronary artery 01/06/2014  . Benign essential hypertension 01/06/2014  . Headache 01/06/2014  . Hypersomnia with sleep apnea 01/06/2014  . Pure hypercholesterolemia 01/06/2014  . Type II diabetes mellitus with  manifestations ([place]) 01/06/2014  . Hyperlipidemia associated with type 2 diabetes mellitus ([place]) 01/06/2014    Past Surgical History:  Procedure Laterality Date  . APPENDECTOMY    . AUGMENTATION MAMMAPLASTY Bilateral 1982/redo in 2014   prior mastectomy  . CARDIAC CATHETERIZATION N/A 04/23/2016   Procedure: Left Heart Cath and Coronary Angiography;  Surgeon: Corey Skains, MD;  Location: [place] [facility];  Service: Cardiovascular;  Laterality: N/A;  . CARDIAC CATHETERIZATION Left 04/23/2016   Procedure: Coronary Stent Intervention;  Surgeon: Yolonda Kida, MD;  Location: [place] [facility];  Service: Cardiovascular;  Laterality: Left;  . CAROTID ARTERY ANGIOPLASTY Left 2010   had stent inserted and removed d/t infection. artery from leg inserted in left carotid  . CAROTID ENDARTERECTOMY Left 2010  . CHOLECYSTECTOMY    . CORONARY ANGIOPLASTY WITH STENT PLACEMENT  september 9th 2017  . CYSTOSCOPY W/ URETERAL STENT PLACEMENT Left 08/17/2017   Procedure: CYSTOSCOPY WITH RETROGRADE PYELOGRAM/URETERAL STENT PLACEMENT;  Surgeon: Festus Aloe, MD;  Location: [facility];  Service: Urology;  Laterality: Left;  . CYSTOSCOPY W/ URETERAL STENT PLACEMENT Left 09/16/2017   Procedure: CYSTOSCOPY WITH STENT REPLACEMENT;  Surgeon: Abbie Sons, MD;  Location: [facility];  Service: Urology;  Laterality: Left;  . CYSTOSCOPY/RETROGRADE/URETEROSCOPY/STONE EXTRACTION WITH BASKET Left 09/16/2017   Procedure: CYSTOSCOPY/RETROGRADE/URETEROSCOPY/STONE EXTRACTION WITH BASKET;  Surgeon: Abbie Sons, MD;  Location: [facility];  Service: Urology;  Laterality: Left;  . EYE SURGERY Bilateral    cataract extraction  . KYPHOPLASTY N/A 02/03/2018   Procedure: [id number];  Surgeon: Hessie Knows, MD;  Location: [facility];  Service: Orthopedics;  Laterality: N/A;  . MASTECTOMY Right 1982  . PACEMAKER INSERTION Left 07/03/2016   Procedure: INSERTION PACEMAKER;  Surgeon: Isaias Cowman, MD;  Location:  [facility];  Service: Cardiovascular;  Laterality: Left;  . SKIN CANCER EXCISION    . TUBAL LIGATION    . VISCERAL ANGIOGRAPHY N/A 11/03/2019   Procedure: VISCERAL ANGIOGRAPHY;  Surgeon: Katha Cabal, MD;  Location: [place] [facility];  Service: Cardiovascular;  Laterality: N/A;    Prior to Admission medications   Medication Sig Start Date End Date Taking? Authorizing Provider  amitriptyline (ELAVIL) 10 MG tablet Take 10 mg by mouth at bedtime. 08/14/20   [provider]  ammonium lactate (AMLACTIN) 12 % lotion Apply 1 application topically as needed for dry skin. Patient not taking: Reported on 08/16/2020 10/05/19   Felipa Furnace, DPM  aspirin EC 81 MG tablet Take 81 mg by mouth daily.     [provider]  buPROPion (WELLBUTRIN XL) 150 MG 24 hr tablet Take 150 mg by mouth daily.  Patient not taking: Reported on 08/16/2020 09/06/19   [provider]  carbidopa-levodopa (SINEMET IR) 25-100 MG tablet Take 1 tablet by mouth at bedtime. Patient not taking: Reported on 08/16/2020    [provider]  cefdinir (OMNICEF) 300 MG capsule Take 1 capsule (300 mg total) by mouth 2 (two) times daily. Patient not taking: Reported on 08/16/2020 10/05/18   Salary, Holly Bodily D, MD  Cholecalciferol (VITAMIN D3) 2000 units TABS Take 2,000 Units by mouth daily.  [provider]  clopidogrel (PLAVIX) 75 MG tablet TAKE 1 TABLET BY MOUTH DAILY 09/25/20   Schnier, Dolores Lory, MD  Continuous Blood Gluc Receiver (FREESTYLE LIBRE 14 DAY READER) DEVI Use 1 each as needed .  Use to test blood sugar 10/22/18   [provider]  Continuous Blood Gluc Sensor (FREESTYLE LIBRE 14 DAY SENSOR) MISC Use 1 each every 14 (fourteen) days 10/22/18   [provider]  diazepam (VALIUM) 2 MG tablet Take 2 mg by mouth 2 (two) times daily. 07/25/20   [provider]  furosemide (LASIX) 20 MG tablet Take by mouth. Patient not taking: Reported on 08/16/2020 06/20/20 06/20/21   [provider]  HUMALOG KWIKPEN 100 UNIT/ML KiwkPen Inject 10 Units into the skin 3 (three) times daily. *Use as directed per sliding scale* 03/03/15   [provider]  insulin glargine (LANTUS) 100 UNIT/ML injection Inject 0.5 mLs (50 Units total) into the skin at bedtime. Patient taking differently: Inject 12 Units into the skin at bedtime. 08/21/18   Bettey Costa, MD  levothyroxine (SYNTHROID, LEVOTHROID) 112 MCG tablet Take 1 tablet (112 mcg total) by mouth daily before breakfast. 06/17/16   Bettey Costa, MD  magnesium oxide (MAG-OX) 400 MG tablet Take 400 mg by mouth at bedtime.  09/24/19   [provider]  metFORMIN (GLUCOPHAGE-XR) 500 MG 24 hr tablet Take 2 tablets (1,000 mg total) by mouth 2 (two) times daily. Please start this on 8/24 08/21/18   Bettey Costa, MD  metoprolol succinate (TOPROL-XL) 25 MG 24 hr tablet Take 1 tablet (25 mg total) by mouth daily. 10/06/18   Salary, Avel Peace, MD  Multiple Vitamin (MULTIVITAMIN WITH MINERALS) TABS tablet Take 1 tablet by mouth daily. 09/24/17   Max Sane, MD  nitroGLYCERIN (NITROSTAT) 0.4 MG SL tablet Place 1 tablet (0.4 mg total) under the tongue every 5 (five) minutes as needed for chest pain. 04/24/16   Bettey Costa, MD  pantoprazole (PROTONIX) 40 MG tablet TAKE 1 TABLET BY MOUTH DAILY TAKE 15-20 MINS BEFORE A MEAL Patient not taking: Reported on 08/16/2020 05/22/20   [provider]  PARoxetine (PAXIL) 10 MG tablet Take 1 tablet by mouth daily. 04/20/20   [provider]  pioglitazone (ACTOS) 30 MG tablet Take by mouth. Patient not taking: Reported on 08/16/2020 04/14/20 04/14/21  [provider]  pramipexole (MIRAPEX) 0.5 MG tablet Take 0.5 mg by mouth at bedtime. 09/24/19   [provider]  rOPINIRole (REQUIP) 0.5 MG tablet Take 0.5 mg by mouth in the morning and at bedtime. Patient not taking: Reported on 08/16/2020    [provider]  rosuvastatin (CRESTOR) 10 MG tablet Take 2  tablets (20 mg total) by mouth at bedtime. Patient taking differently: Take 10 mg by mouth at bedtime. 08/19/18   Demetrios Loll, MD  solifenacin (VESICARE) 10 MG tablet Take by mouth. 04/14/20 04/14/21  [provider]  triamterene-hydrochlorothiazide (DYAZIDE) 37.5-25 MG capsule Take 1 capsule by mouth daily.  09/24/19   [provider]    Allergies Patient has no known allergies.  Family History  Problem Relation Age of Onset  . Prostate cancer Neg Hx   . Kidney cancer Neg Hx   . Breast cancer Neg Hx     Social History Social History   Tobacco Use  . Smoking status: Former Smoker    Packs/day: 0.50    Types: Cigarettes    Quit date: 09/02/1996    Years since quitting: 24.0  . Smokeless tobacco:  Never Used  Vaping Use  . Vaping Use: Never used  Substance Use Topics  . Alcohol use: Not Currently    Alcohol/week: 1.0 standard drink    Types: 1 Glasses of wine per week  . Drug use: No    Review of Systems Constitutional: No fever. Eyes: No visual changes. ENT: No sore throat. Cardiovascular: Denies chest pain. Respiratory: + shortness of breath. Gastrointestinal: No nausea, vomiting.  +diarrhea. Genitourinary: Negative for dysuria. Musculoskeletal: Negative for back pain. Skin: Negative for rash. Neurological: Negative for focal weakness or numbness.  ____________________________________________   PHYSICAL EXAM:  VITAL SIGNS: ED Triage Vitals  Enc Vitals Group     BP 09/27/20 1226 (!) 148/47     Pulse Rate 09/27/20 1226 62     Resp 09/27/20 1226 17     Temp 09/27/20 1226 97.6 F (36.4 C)     Temp Source 09/27/20 1226 Oral     SpO2 09/27/20 1226 100 %     Weight 09/27/20 1228 152 lb (68.9 kg)     Height 09/27/20 1228 5\' 2"  (1.575 m)     Head Circumference --      Peak Flow --      Pain Score 09/27/20 1228 0     Pain Loc --      Pain Edu? --      Excl. in [place]? --    CONSTITUTIONAL: Alert and oriented and responds appropriately to questions.   Elderly. HEAD: Normocephalic, patient has soft tissue swelling and ecchymosis around the right eye, 1 cm laceration underneath the right medial eyebrow EYES: Conjunctivae clear, pupils appear equal, EOM appear intact, no hyphema, globe appears intact, no subconjunctival hemorrhage ENT: normal nose; moist mucous membranes NECK: Supple, normal ROM, no midline spinal tenderness or step-off or deformity CARD: RRR; S1 and S2 appreciated; no murmurs, no clicks, no rubs, no gallops RESP: Normal chest excursion without splinting or tachypnea; breath sounds clear and equal bilaterally; no wheezes, no rhonchi, no rales, no hypoxia or respiratory distress, speaking full sentences ABD/GI: Normal bowel sounds; non-distended; soft, non-tender, no rebound, no guarding, no peritoneal signs, no hepatosplenomegaly BACK: The back appears normal, no midline spinal tenderness or step-off or deformity EXT: Normal ROM in all joints; no deformity noted, no edema; no cyanosis SKIN: Normal color for age and race; warm; no rash on exposed skin NEURO: Moves all extremities equally, no pronator drift, sensation to light touch intact diffusely, cranial nerves II through XII intact, normal speech PSYCH: The patient's mood and manner are appropriate.  ____________________________________________   LABS (all labs ordered are listed, but only abnormal results are displayed)  Labs Reviewed  SARS CORONAVIRUS 2 BY RT PCR (HOSPITAL ORDER, [place] [facility]) - Abnormal; Notable for the following components:      Result Value   SARS Coronavirus 2 POSITIVE (*)    All other components within normal limits  BASIC METABOLIC PANEL - Abnormal; Notable for the following components:   Glucose, Bld 163 (*)    BUN 38 (*)    Creatinine, Ser 1.58 (*)    GFR, Estimated 32 (*)    All other components within normal limits  CBC - Abnormal; Notable for the following components:   RBC 3.85 (*)    Hemoglobin 10.8 (*)     HCT 33.8 (*)    RDW 15.9 (*)    All other components within normal limits  CBG MONITORING, ED - Abnormal; Notable for the following components:  Glucose-Capillary 143 (*)    All other components within normal limits  URINALYSIS, COMPLETE (UACMP) WITH MICROSCOPIC  TROPONIN I (HIGH SENSITIVITY)   ____________________________________________  EKG   EKG Interpretation  Date/Time:  Wednesday September 27 2020 12:29:58 EST Ventricular Rate:  65 PR Interval:    QRS Duration: 130 QT Interval:  424 QTC Calculation: 440 R Axis:   -75 Text Interpretation: AV dual-paced rhythm Abnormal ECG Confirmed by UNCONFIRMED, DOCTOR ([id number]), editor Mel Almond, Lynelle Smoke [phone]) on 09/27/2020 2:10:11 PM       ____________________________________________  RADIOLOGY Jessie Foot Eira Alpert, personally viewed and evaluated these images (plain radiographs) as part of my medical decision making, as well as reviewing the written report by the radiologist.  ED MD interpretation: Chest x-ray shows no infiltrate or edema.  Head CT unremarkable.  Official radiology report(s): DG Chest 2 View  Result Date: 09/28/2020 CLINICAL DATA:  Shortness of breath EXAM: CHEST - 2 VIEW COMPARISON:  07/21/2020 FINDINGS: Cardiac pacemaker. Shallow inspiration with atelectasis in the lung bases. Mild cardiac enlargement. Emphysematous changes and fibrosis in the lungs. No definite consolidation or effusion. Degenerative changes in the spine. Lower thoracic kyphoplasty. IMPRESSION: Shallow inspiration with atelectasis in the lung bases. Emphysematous changes and fibrosis. Electronically Signed   By: Lucienne Capers M.D.   On: 09/28/2020 01:28   CT HEAD WO CONTRAST  Result Date: 09/27/2020 CLINICAL DATA:  Fall.  RIGHT eye laceration.  Dizziness EXAM: CT HEAD WITHOUT CONTRAST CT MAXILLOFACIAL WITHOUT CONTRAST CT CERVICAL SPINE WITHOUT CONTRAST TECHNIQUE: Multidetector CT imaging of the head, cervical spine, and maxillofacial structures were  performed using the standard protocol without intravenous contrast. Multiplanar CT image reconstructions of the cervical spine and maxillofacial structures were also generated. COMPARISON:  09/06/2020 FINDINGS: CT HEAD FINDINGS Brain: No acute intracranial hemorrhage. No focal mass lesion. No CT evidence of acute infarction. No midline shift or mass effect. No hydrocephalus. Basilar cisterns are patent. There are periventricular and subcortical white matter hypodensities. Generalized cortical atrophy. Vascular: No hyperdense vessel or unexpected calcification. Skull: Normal. Negative for fracture or focal lesion. Sinuses/Orbits: Paranasal sinuses and mastoid air cells are clear. Orbits are clear. Other: None. CT MAXILLOFACIAL FINDINGS Osseous: Orbital rims are intact. Zygomatic arches are intact. No fracture of the maxillary sinus walls. Acute nasal bone fracture. Pterygoid plates are normal. Mandibular condyles are located. No mandibular fracture is. Orbits: Globes are normal.  Intraconal contents are normal. Sinuses: Interval clearing of hematoma from the ethmoid air cells and posterior nasopharynx from comparison CT. No evidence fluid in the paranasal sinuses. Soft tissues: Soft tissue thickening over the RIGHT orbit. No fracture. CT CERVICAL SPINE FINDINGS Alignment: Normal alignment of the cervical vertebral bodies. Skull base and vertebrae: Normal craniocervical junction. No loss of vertebral body height or disc height. Normal facet articulation. No evidence of fracture. Soft tissues and spinal canal: No prevertebral soft tissue swelling. No perispinal or epidural hematoma. Disc levels:  Unremarkable Upper chest: Centrilobular emphysema the upper lobes. Pleuroparenchymal thickening at the LEFT upper lobe appears benign. Other: None IMPRESSION: 1. No acute intracranial findings. 2. Soft tissue injury over the RIGHT orbit. No orbital fracture. No injury to the orbit. 3. No cervical spine fracture. 4. Scarring  and pleural thickening at the lung apices. Electronically Signed   By: Suzy Bouchard M.D.   On: 09/27/2020 13:38   CT CERVICAL SPINE WO CONTRAST  Result Date: 09/27/2020 CLINICAL DATA:  Fall.  RIGHT eye laceration.  Dizziness EXAM: CT HEAD WITHOUT CONTRAST CT MAXILLOFACIAL WITHOUT CONTRAST CT  CERVICAL SPINE WITHOUT CONTRAST TECHNIQUE: Multidetector CT imaging of the head, cervical spine, and maxillofacial structures were performed using the standard protocol without intravenous contrast. Multiplanar CT image reconstructions of the cervical spine and maxillofacial structures were also generated. COMPARISON:  09/06/2020 FINDINGS: CT HEAD FINDINGS Brain: No acute intracranial hemorrhage. No focal mass lesion. No CT evidence of acute infarction. No midline shift or mass effect. No hydrocephalus. Basilar cisterns are patent. There are periventricular and subcortical white matter hypodensities. Generalized cortical atrophy. Vascular: No hyperdense vessel or unexpected calcification. Skull: Normal. Negative for fracture or focal lesion. Sinuses/Orbits: Paranasal sinuses and mastoid air cells are clear. Orbits are clear. Other: None. CT MAXILLOFACIAL FINDINGS Osseous: Orbital rims are intact. Zygomatic arches are intact. No fracture of the maxillary sinus walls. Acute nasal bone fracture. Pterygoid plates are normal. Mandibular condyles are located. No mandibular fracture is. Orbits: Globes are normal.  Intraconal contents are normal. Sinuses: Interval clearing of hematoma from the ethmoid air cells and posterior nasopharynx from comparison CT. No evidence fluid in the paranasal sinuses. Soft tissues: Soft tissue thickening over the RIGHT orbit. No fracture. CT CERVICAL SPINE FINDINGS Alignment: Normal alignment of the cervical vertebral bodies. Skull base and vertebrae: Normal craniocervical junction. No loss of vertebral body height or disc height. Normal facet articulation. No evidence of fracture. Soft tissues  and spinal canal: No prevertebral soft tissue swelling. No perispinal or epidural hematoma. Disc levels:  Unremarkable Upper chest: Centrilobular emphysema the upper lobes. Pleuroparenchymal thickening at the LEFT upper lobe appears benign. Other: None IMPRESSION: 1. No acute intracranial findings. 2. Soft tissue injury over the RIGHT orbit. No orbital fracture. No injury to the orbit. 3. No cervical spine fracture. 4. Scarring and pleural thickening at the lung apices. Electronically Signed   By: Suzy Bouchard M.D.   On: 09/27/2020 13:38   CT MAXILLOFACIAL WO CONTRAST  Result Date: 09/27/2020 CLINICAL DATA:  Fall.  RIGHT eye laceration.  Dizziness EXAM: CT HEAD WITHOUT CONTRAST CT MAXILLOFACIAL WITHOUT CONTRAST CT CERVICAL SPINE WITHOUT CONTRAST TECHNIQUE: Multidetector CT imaging of the head, cervical spine, and maxillofacial structures were performed using the standard protocol without intravenous contrast. Multiplanar CT image reconstructions of the cervical spine and maxillofacial structures were also generated. COMPARISON:  09/06/2020 FINDINGS: CT HEAD FINDINGS Brain: No acute intracranial hemorrhage. No focal mass lesion. No CT evidence of acute infarction. No midline shift or mass effect. No hydrocephalus. Basilar cisterns are patent. There are periventricular and subcortical white matter hypodensities. Generalized cortical atrophy. Vascular: No hyperdense vessel or unexpected calcification. Skull: Normal. Negative for fracture or focal lesion. Sinuses/Orbits: Paranasal sinuses and mastoid air cells are clear. Orbits are clear. Other: None. CT MAXILLOFACIAL FINDINGS Osseous: Orbital rims are intact. Zygomatic arches are intact. No fracture of the maxillary sinus walls. Acute nasal bone fracture. Pterygoid plates are normal. Mandibular condyles are located. No mandibular fracture is. Orbits: Globes are normal.  Intraconal contents are normal. Sinuses: Interval clearing of hematoma from the ethmoid air  cells and posterior nasopharynx from comparison CT. No evidence fluid in the paranasal sinuses. Soft tissues: Soft tissue thickening over the RIGHT orbit. No fracture. CT CERVICAL SPINE FINDINGS Alignment: Normal alignment of the cervical vertebral bodies. Skull base and vertebrae: Normal craniocervical junction. No loss of vertebral body height or disc height. Normal facet articulation. No evidence of fracture. Soft tissues and spinal canal: No prevertebral soft tissue swelling. No perispinal or epidural hematoma. Disc levels:  Unremarkable Upper chest: Centrilobular emphysema the upper lobes. Pleuroparenchymal thickening at  the LEFT upper lobe appears benign. Other: None IMPRESSION: 1. No acute intracranial findings. 2. Soft tissue injury over the RIGHT orbit. No orbital fracture. No injury to the orbit. 3. No cervical spine fracture. 4. Scarring and pleural thickening at the lung apices. Electronically Signed   By: Suzy Bouchard M.D.   On: 09/27/2020 13:38    ____________________________________________   PROCEDURES  Procedure(s) performed (including Critical Care):  Procedures  CRITICAL CARE Performed by: Cyril Mourning [place] Z[place]i   Total critical care time: 45 minutes  Critical care time was exclusive of separately billable procedures and treating other patients.  Critical care was necessary to treat or prevent imminent or life-threatening deterioration.  Critical care was time spent personally by me on the following activities: development of treatment plan with patient and/or surrogate as well as nursing, discussions with consultants, evaluation of patient's response to treatment, examination of patient, obtaining history from patient or surrogate, ordering and performing treatments and interventions, ordering and review of laboratory studies, ordering and review of radiographic studies, pulse oximetry and re-evaluation of patient's  condition.  ____________________________________________   INITIAL IMPRESSION / ASSESSMENT AND PLAN / ED COURSE  As part of my medical decision making, I reviewed the following data within the [place] History obtained from family, Nursing notes reviewed and incorporated, Labs reviewed, EKG interpreted NSR, Old EKG reviewed, Old chart reviewed, Radiograph reviewed and Notes from prior ED visits         Patient here with generalized weakness, lightheadedness, vertigo.  She did have a fall at the beginning of this week and was not evaluated.  She is on Plavix.  Head CT obtained as well as cervical spine, maxillofacial CT showed no acute abnormality other than soft tissue swelling around the right eye.  Unable to obtain an MRI of her brain at this time given she has a pacemaker.  Will obtain CT of the head and cervical spine given she is at risk for stroke.  She has however outside of TPA window with symptoms starting at 11 AM.  Will give IV hydration, meclizine for symptomatic relief.  Labs show no acute abnormality.  No significant anemia, electrolyte derangement.  Has chronic kidney disease which appears stable.  Will add on troponin given she is complaining of shortness of breath as well as a chest x-ray.  I am also concerned that this could be COVID-19.  Will obtain swab today.  She may need admission to the hospital.  Patient reports her oxygen saturation on arrival was 47% but this is not documented anywhere with the nursing notes.  She is on 2 L right now and does not wear oxygen chronically.  We have taken her off of oxygen and sats are 96% on room air.    1:53 AM  Pt now satting 88% on room air at rest.  Will place back on 2 L nasal cannula.  Troponin is normal.  Chest x-ray shows no acute abnormality other than emphysematous changes and fibrosis.  She does not wear oxygen chronically.  Covid test pending.  2:40 AM  Pt is not orthostatic.  She reports feeling lightheaded  with standing and having some vertiginous symptoms as well but has no nystagmus, dysmetria to finger-to-nose testing.  She is at risk for stroke but unable to obtain MRI of the brain due to pacemaker or CTA of the head or neck due to patient's GFR.  Will give meclizine and reassess.  Her Covid test has come back positive.  Suspect  this is the cause of her symptoms. I have recommended admission to a new oxygen requirement.  Will give steroids and remdesivir here.  Will hold on IV fluids given positive COVID-19 test.  2:47 AM Discussed patient's case with hospitalist, Dr. Sidney Ace.  I have recommended admission and patient (and family if present) agree with this plan. Admitting physician will place admission orders.   I reviewed all nursing notes, vitals, pertinent previous records and reviewed/interpreted all EKGs, lab and urine results, imaging (as available).  4:34 AM  Pt's [employer] pacemaker interrogated.  No acute events.  Pacemaker working appropriately. ____________________________________________   FINAL CLINICAL IMPRESSION(S) / ED DIAGNOSES  Final diagnoses:  Near syncope  Generalized weakness  Acute respiratory failure with hypoxia ([place])  COVID-19     ED Discharge Orders    None      *Please note:  CLARYCE FRIEL was evaluated in Emergency Department on 09/28/2020 for the symptoms described in the history of present illness. She was evaluated in the context of the global COVID-19 pandemic, which necessitated consideration that the patient might be at risk for infection with the SARS-CoV-2 virus that causes COVID-19. Institutional protocols and algorithms that pertain to the evaluation of patients at risk for COVID-19 are in a state of rapid change based on information released by regulatory bodies including the CDC and federal and state organizations. These policies and algorithms were followed during the patient's care in the ED.  Some ED evaluations and interventions may be  delayed as a result of limited staffing during and the pandemic.*   Note:  This document was prepared using Dragon voice recognition software and may include unintentional dictation errors.   Tawan Corkern, Delice Bison, DO 09/28/20 [place], Delice Bison, DO 09/28/20 [phone]

## 2020-09-28 NOTE — ED Notes (Signed)
Patient placed on purewick, states she uses this at home. Brief changed, chuck placed under patient. Patient placed in gown and clothing placed in belongings bag at bedside. Given warm blankets.

## 2020-09-28 NOTE — ED Notes (Signed)
[facility] faxed results. Given to MD Ward. [facility] reports "pt has huge p waves but that is historical looking back at her record". MD Ward aware.

## 2020-09-28 NOTE — ED Notes (Signed)
Room air sats at this time are 96% including while standing at bedside for orthostatic VS. Pt transported to xray at this time.

## 2020-09-28 NOTE — ED Notes (Signed)
Lab called this RN and informed pt is covid positive. MD Ward made aware.

## 2020-09-28 NOTE — H&P (Signed)
[place]   PATIENT NAME: Cassandra [name]    MR#:  [id number]  DATE OF BIRTH:  06/23/1933  DATE OF ADMISSION:  09/28/2020  PRIMARY CARE PHYSICIAN: Idelle Crouch, MD   REQUESTING/REFERRING PHYSICIAN: Andee Lineman, DO CHIEF COMPLAINT:   Chief Complaint  Patient presents with  . Near Syncope    HISTORY OF PRESENT ILLNESS:  Cassandra [name]  is a 87 y.o. Caucasian female with a known history of hypothyroidism, hypertension, coronary artery disease, depression, type diabetes mellitus and dyslipidemia, who presented to the emergency room with acute onset of worsening dyspnea with associated nonproductive cough and diarrhea as well as vertigo and presyncope.  She is not sure if she lost consciousness but she has a right periorbital contusion.  She was hypoxic initially in the ER.  She admitted to chills and possibly tactile fever.  No chest pain or palpitations.  No dysuria, oliguria or hematuria or flank pain.  She had 2 vaccine injections for Covid 19.   Upon presentation to the ER, blood pressure was 145/54 and pulse oximetry was 88 to 89% on room air and later 97 to 90% on 2 L of O2 by nasal cannula with otherwise normal vital signs.  Labs revealed a BUN of 38 and creatinine 1.58 slightly above 1.41 on 07/21/2020.  High-sensitivity troponin I came back 5.  CBC showed mild anemia lower than previous levels.  COVID-19 PCR came back positive.  Chest x-ray showed emphysematous changes and fibrosis with shallow inspiration with atelectasis in the lung bases.  The patient was given 25 mg p.o. meclizine, IV remdesivir and Solu-Medrol.  She will be admitted to medical monitored isolation bed for further evaluation and management. PAST MEDICAL HISTORY:   Past Medical History:  Diagnosis Date  . Acquired hypothyroidism 01/06/2014  . Anxiety   . Arthritis   . B12 deficiency 02/15/2014  . Benign essential hypertension 01/06/2014  . Breast cancer [facility]) 1982   Right breast cancer -  chemotherapy  . CAD (coronary artery disease), native coronary artery 01/06/2014  . Carotid artery calcification   . Chronic airway obstruction, not elsewhere classified 01/06/2014  . Depression   . Diabetes mellitus without complication ([place])   . History of kidney stones 2019   left ureteral stone  . Incontinence of urine   . Myocardial infarction ([place]) 1982   small infarct  . Neuromuscular disorder ([facility])    restless legs  . Pacemaker   . Presence of permanent cardiac pacemaker 07/2016  . Pure hypercholesterolemia 01/06/2014  . Renal insufficiency   . Skin cancer   . Sleep apnea    uses cpap    PAST SURGICAL HISTORY:   Past Surgical History:  Procedure Laterality Date  . APPENDECTOMY    . AUGMENTATION MAMMAPLASTY Bilateral 1982/redo in 2014   prior mastectomy  . CARDIAC CATHETERIZATION N/A 04/23/2016   Procedure: Left Heart Cath and Coronary Angiography;  Surgeon: Corey Skains, MD;  Location: [place] [facility];  Service: Cardiovascular;  Laterality: N/A;  . CARDIAC CATHETERIZATION Left 04/23/2016   Procedure: Coronary Stent Intervention;  Surgeon: Yolonda Kida, MD;  Location: [place] [facility];  Service: Cardiovascular;  Laterality: Left;  . CAROTID ARTERY ANGIOPLASTY Left 2010   had stent inserted and removed d/t infection. artery from leg inserted in left carotid  . CAROTID ENDARTERECTOMY Left 2010  . CHOLECYSTECTOMY    . CORONARY ANGIOPLASTY WITH STENT PLACEMENT  september 9th 2017  . CYSTOSCOPY W/ URETERAL  STENT PLACEMENT Left 08/17/2017   Procedure: CYSTOSCOPY WITH RETROGRADE PYELOGRAM/URETERAL STENT PLACEMENT;  Surgeon: Festus Aloe, MD;  Location: [facility];  Service: Urology;  Laterality: Left;  . CYSTOSCOPY W/ URETERAL STENT PLACEMENT Left 09/16/2017   Procedure: CYSTOSCOPY WITH STENT REPLACEMENT;  Surgeon: Abbie Sons, MD;  Location: [facility];  Service: Urology;  Laterality: Left;  . CYSTOSCOPY/RETROGRADE/URETEROSCOPY/STONE EXTRACTION WITH BASKET  Left 09/16/2017   Procedure: CYSTOSCOPY/RETROGRADE/URETEROSCOPY/STONE EXTRACTION WITH BASKET;  Surgeon: Abbie Sons, MD;  Location: [facility];  Service: Urology;  Laterality: Left;  . EYE SURGERY Bilateral    cataract extraction  . KYPHOPLASTY N/A 02/03/2018   Procedure: [id number];  Surgeon: Hessie Knows, MD;  Location: [facility];  Service: Orthopedics;  Laterality: N/A;  . MASTECTOMY Right 1982  . PACEMAKER INSERTION Left 07/03/2016   Procedure: INSERTION PACEMAKER;  Surgeon: Isaias Cowman, MD;  Location: [facility];  Service: Cardiovascular;  Laterality: Left;  . SKIN CANCER EXCISION    . TUBAL LIGATION    . VISCERAL ANGIOGRAPHY N/A 11/03/2019   Procedure: VISCERAL ANGIOGRAPHY;  Surgeon: Katha Cabal, MD;  Location: [place] [facility];  Service: Cardiovascular;  Laterality: N/A;    SOCIAL HISTORY:   Social History   Tobacco Use  . Smoking status: Former Smoker    Packs/day: 0.50    Types: Cigarettes    Quit date: 09/02/1996    Years since quitting: 24.0  . Smokeless tobacco: Never Used  Substance Use Topics  . Alcohol use: Not Currently    Alcohol/week: 1.0 standard drink    Types: 1 Glasses of wine per week    FAMILY HISTORY:   Family History  Problem Relation Age of Onset  . Prostate cancer Neg Hx   . Kidney cancer Neg Hx   . Breast cancer Neg Hx     DRUG ALLERGIES:  No Known Allergies  REVIEW OF SYSTEMS:   ROS As per history of present illness. All pertinent systems were reviewed above. Constitutional, HEENT, cardiovascular, respiratory, GI, GU, musculoskeletal, neuro, psychiatric, endocrine, integumentary and hematologic systems were reviewed and are otherwise negative/unremarkable except for positive findings mentioned above in the HPI.   MEDICATIONS AT HOME:   Prior to Admission medications   Medication Sig Start Date End Date Taking? Authorizing Provider  amitriptyline (ELAVIL) 10 MG tablet Take 10 mg by mouth at bedtime. 08/14/20    [provider]  ammonium lactate (AMLACTIN) 12 % lotion Apply 1 application topically as needed for dry skin. Patient not taking: Reported on 08/16/2020 10/05/19   Felipa Furnace, DPM  aspirin EC 81 MG tablet Take 81 mg by mouth daily.     [provider]  buPROPion (WELLBUTRIN XL) 150 MG 24 hr tablet Take 150 mg by mouth daily.  Patient not taking: Reported on 08/16/2020 09/06/19   [provider]  carbidopa-levodopa (SINEMET IR) 25-100 MG tablet Take 1 tablet by mouth at bedtime. Patient not taking: Reported on 08/16/2020    [provider]  cefdinir (OMNICEF) 300 MG capsule Take 1 capsule (300 mg total) by mouth 2 (two) times daily. Patient not taking: Reported on 08/16/2020 10/05/18   Salary, Holly Bodily D, MD  Cholecalciferol (VITAMIN D3) 2000 units TABS Take 2,000 Units by mouth daily.    [provider]  clopidogrel (PLAVIX) 75 MG tablet TAKE 1 TABLET BY MOUTH DAILY 09/25/20   Schnier, Dolores Lory, MD  Continuous Blood Gluc Receiver (FREESTYLE LIBRE 14 DAY READER) DEVI Use 1 each as needed .  Use to  test blood sugar 10/22/18   [provider]  Continuous Blood Gluc Sensor (FREESTYLE LIBRE 14 DAY SENSOR) MISC Use 1 each every 14 (fourteen) days 10/22/18   [provider]  diazepam (VALIUM) 2 MG tablet Take 2 mg by mouth 2 (two) times daily. 07/25/20   [provider]  furosemide (LASIX) 20 MG tablet Take by mouth. Patient not taking: Reported on 08/16/2020 06/20/20 06/20/21  [provider]  HUMALOG KWIKPEN 100 UNIT/ML KiwkPen Inject 10 Units into the skin 3 (three) times daily. *Use as directed per sliding scale* 03/03/15   [provider]  insulin glargine (LANTUS) 100 UNIT/ML injection Inject 0.5 mLs (50 Units total) into the skin at bedtime. Patient taking differently: Inject 12 Units into the skin at bedtime. 08/21/18   Bettey Costa, MD  levothyroxine (SYNTHROID, LEVOTHROID) 112 MCG tablet Take 1 tablet (112 mcg  total) by mouth daily before breakfast. 06/17/16   Bettey Costa, MD  magnesium oxide (MAG-OX) 400 MG tablet Take 400 mg by mouth at bedtime.  09/24/19   [provider]  metFORMIN (GLUCOPHAGE-XR) 500 MG 24 hr tablet Take 2 tablets (1,000 mg total) by mouth 2 (two) times daily. Please start this on 8/24 08/21/18   Bettey Costa, MD  metoprolol succinate (TOPROL-XL) 25 MG 24 hr tablet Take 1 tablet (25 mg total) by mouth daily. 10/06/18   Salary, Avel Peace, MD  Multiple Vitamin (MULTIVITAMIN WITH MINERALS) TABS tablet Take 1 tablet by mouth daily. 09/24/17   Max Sane, MD  nitroGLYCERIN (NITROSTAT) 0.4 MG SL tablet Place 1 tablet (0.4 mg total) under the tongue every 5 (five) minutes as needed for chest pain. 04/24/16   Bettey Costa, MD  pantoprazole (PROTONIX) 40 MG tablet TAKE 1 TABLET BY MOUTH DAILY TAKE 15-20 MINS BEFORE A MEAL Patient not taking: Reported on 08/16/2020 05/22/20   [provider]  PARoxetine (PAXIL) 10 MG tablet Take 1 tablet by mouth daily. 04/20/20   [provider]  pioglitazone (ACTOS) 30 MG tablet Take by mouth. Patient not taking: Reported on 08/16/2020 04/14/20 04/14/21  [provider]  pramipexole (MIRAPEX) 0.5 MG tablet Take 0.5 mg by mouth at bedtime. 09/24/19   [provider]  rOPINIRole (REQUIP) 0.5 MG tablet Take 0.5 mg by mouth in the morning and at bedtime. Patient not taking: Reported on 08/16/2020    [provider]  rosuvastatin (CRESTOR) 10 MG tablet Take 2 tablets (20 mg total) by mouth at bedtime. Patient taking differently: Take 10 mg by mouth at bedtime. 08/19/18   Demetrios Loll, MD  solifenacin (VESICARE) 10 MG tablet Take by mouth. 04/14/20 04/14/21  [provider]  triamterene-hydrochlorothiazide (DYAZIDE) 37.5-25 MG capsule Take 1 capsule by mouth daily.  09/24/19   [provider]      VITAL SIGNS:  Blood pressure 133/89, pulse (!) 58, temperature (!) 97.5 F (36.4 C), temperature source  Oral, resp. rate 16, height 5\' 2"  (1.575 m), weight 68.9 kg, SpO2 98 %.  PHYSICAL EXAMINATION:  Physical Exam  GENERAL:  87 y.o.-year-old Caucasian female patient lying in the bed with mild respiratory stress with conversational dyspnea.  She was drowsy but arousable.[name]  EYES: Pupils equal, round, reactive to light and accommodation. No scleral icterus. Extraocular muscles intact.  HEENT: Head atraumatic, normocephalic. Oropharynx and nasopharynx clear.  NECK:  Supple, no jugular venous distention. No thyroid enlargement, no tenderness.  LUNGS: Slightly diminished bibasal breath sounds with bibasal crackles.   CARDIOVASCULAR: Regular rate and rhythm, S1, S2  normal. No murmurs, rubs, or gallops.  ABDOMEN: Soft, nondistended, nontender. Bowel sounds present. No organomegaly or mass.  EXTREMITIES: No pedal edema, cyanosis, or clubbing.  NEUROLOGIC: Cranial nerves II through XII are intact. Muscle strength 5/5 in all extremities. Sensation intact. Gait not checked.  PSYCHIATRIC: The patient is drowsy but arousable.  Normal affect with poor eye contact. SKIN: No obvious rash, lesion, or ulcer.   LABORATORY PANEL:   CBC Recent Labs  Lab 09/27/20 1230  WBC 7.8  HGB 10.8*  HCT 33.8*  PLT 368   ------------------------------------------------------------------------------------------------------------------  Chemistries  Recent Labs  Lab 09/27/20 1230  NA 139  K 4.8  CL 103  CO2 24  GLUCOSE 163*  BUN 38*  CREATININE 1.58*  CALCIUM 9.2   ------------------------------------------------------------------------------------------------------------------  Cardiac Enzymes No results for input(s): TROPONINI in the last 168 hours. ------------------------------------------------------------------------------------------------------------------  RADIOLOGY:  DG Chest 2 View  Result Date: 09/28/2020 CLINICAL DATA:  Shortness of breath EXAM: CHEST - 2 VIEW COMPARISON:  07/21/2020  FINDINGS: Cardiac pacemaker. Shallow inspiration with atelectasis in the lung bases. Mild cardiac enlargement. Emphysematous changes and fibrosis in the lungs. No definite consolidation or effusion. Degenerative changes in the spine. Lower thoracic kyphoplasty. IMPRESSION: Shallow inspiration with atelectasis in the lung bases. Emphysematous changes and fibrosis. Electronically Signed   By: Lucienne Capers M.D.   On: 09/28/2020 01:28   CT HEAD WO CONTRAST  Result Date: 09/27/2020 CLINICAL DATA:  Fall.  RIGHT eye laceration.  Dizziness EXAM: CT HEAD WITHOUT CONTRAST CT MAXILLOFACIAL WITHOUT CONTRAST CT CERVICAL SPINE WITHOUT CONTRAST TECHNIQUE: Multidetector CT imaging of the head, cervical spine, and maxillofacial structures were performed using the standard protocol without intravenous contrast. Multiplanar CT image reconstructions of the cervical spine and maxillofacial structures were also generated. COMPARISON:  09/06/2020 FINDINGS: CT HEAD FINDINGS Brain: No acute intracranial hemorrhage. No focal mass lesion. No CT evidence of acute infarction. No midline shift or mass effect. No hydrocephalus. Basilar cisterns are patent. There are periventricular and subcortical white matter hypodensities. Generalized cortical atrophy. Vascular: No hyperdense vessel or unexpected calcification. Skull: Normal. Negative for fracture or focal lesion. Sinuses/Orbits: Paranasal sinuses and mastoid air cells are clear. Orbits are clear. Other: None. CT MAXILLOFACIAL FINDINGS Osseous: Orbital rims are intact. Zygomatic arches are intact. No fracture of the maxillary sinus walls. Acute nasal bone fracture. Pterygoid plates are normal. Mandibular condyles are located. No mandibular fracture is. Orbits: Globes are normal.  Intraconal contents are normal. Sinuses: Interval clearing of hematoma from the ethmoid air cells and posterior nasopharynx from comparison CT. No evidence fluid in the paranasal sinuses. Soft tissues: Soft  tissue thickening over the RIGHT orbit. No fracture. CT CERVICAL SPINE FINDINGS Alignment: Normal alignment of the cervical vertebral bodies. Skull base and vertebrae: Normal craniocervical junction. No loss of vertebral body height or disc height. Normal facet articulation. No evidence of fracture. Soft tissues and spinal canal: No prevertebral soft tissue swelling. No perispinal or epidural hematoma. Disc levels:  Unremarkable Upper chest: Centrilobular emphysema the upper lobes. Pleuroparenchymal thickening at the LEFT upper lobe appears benign. Other: None IMPRESSION: 1. No acute intracranial findings. 2. Soft tissue injury over the RIGHT orbit. No orbital fracture. No injury to the orbit. 3. No cervical spine fracture. 4. Scarring and pleural thickening at the lung apices. Electronically Signed   By: Suzy Bouchard M.D.   On: 09/27/2020 13:38   CT CERVICAL SPINE WO CONTRAST  Result Date: 09/27/2020 CLINICAL DATA:  Fall.  RIGHT eye laceration.  Dizziness EXAM: CT  HEAD WITHOUT CONTRAST CT MAXILLOFACIAL WITHOUT CONTRAST CT CERVICAL SPINE WITHOUT CONTRAST TECHNIQUE: Multidetector CT imaging of the head, cervical spine, and maxillofacial structures were performed using the standard protocol without intravenous contrast. Multiplanar CT image reconstructions of the cervical spine and maxillofacial structures were also generated. COMPARISON:  09/06/2020 FINDINGS: CT HEAD FINDINGS Brain: No acute intracranial hemorrhage. No focal mass lesion. No CT evidence of acute infarction. No midline shift or mass effect. No hydrocephalus. Basilar cisterns are patent. There are periventricular and subcortical white matter hypodensities. Generalized cortical atrophy. Vascular: No hyperdense vessel or unexpected calcification. Skull: Normal. Negative for fracture or focal lesion. Sinuses/Orbits: Paranasal sinuses and mastoid air cells are clear. Orbits are clear. Other: None. CT MAXILLOFACIAL FINDINGS Osseous: Orbital rims are  intact. Zygomatic arches are intact. No fracture of the maxillary sinus walls. Acute nasal bone fracture. Pterygoid plates are normal. Mandibular condyles are located. No mandibular fracture is. Orbits: Globes are normal.  Intraconal contents are normal. Sinuses: Interval clearing of hematoma from the ethmoid air cells and posterior nasopharynx from comparison CT. No evidence fluid in the paranasal sinuses. Soft tissues: Soft tissue thickening over the RIGHT orbit. No fracture. CT CERVICAL SPINE FINDINGS Alignment: Normal alignment of the cervical vertebral bodies. Skull base and vertebrae: Normal craniocervical junction. No loss of vertebral body height or disc height. Normal facet articulation. No evidence of fracture. Soft tissues and spinal canal: No prevertebral soft tissue swelling. No perispinal or epidural hematoma. Disc levels:  Unremarkable Upper chest: Centrilobular emphysema the upper lobes. Pleuroparenchymal thickening at the LEFT upper lobe appears benign. Other: None IMPRESSION: 1. No acute intracranial findings. 2. Soft tissue injury over the RIGHT orbit. No orbital fracture. No injury to the orbit. 3. No cervical spine fracture. 4. Scarring and pleural thickening at the lung apices. Electronically Signed   By: Suzy Bouchard M.D.   On: 09/27/2020 13:38   CT MAXILLOFACIAL WO CONTRAST  Result Date: 09/27/2020 CLINICAL DATA:  Fall.  RIGHT eye laceration.  Dizziness EXAM: CT HEAD WITHOUT CONTRAST CT MAXILLOFACIAL WITHOUT CONTRAST CT CERVICAL SPINE WITHOUT CONTRAST TECHNIQUE: Multidetector CT imaging of the head, cervical spine, and maxillofacial structures were performed using the standard protocol without intravenous contrast. Multiplanar CT image reconstructions of the cervical spine and maxillofacial structures were also generated. COMPARISON:  09/06/2020 FINDINGS: CT HEAD FINDINGS Brain: No acute intracranial hemorrhage. No focal mass lesion. No CT evidence of acute infarction. No midline  shift or mass effect. No hydrocephalus. Basilar cisterns are patent. There are periventricular and subcortical white matter hypodensities. Generalized cortical atrophy. Vascular: No hyperdense vessel or unexpected calcification. Skull: Normal. Negative for fracture or focal lesion. Sinuses/Orbits: Paranasal sinuses and mastoid air cells are clear. Orbits are clear. Other: None. CT MAXILLOFACIAL FINDINGS Osseous: Orbital rims are intact. Zygomatic arches are intact. No fracture of the maxillary sinus walls. Acute nasal bone fracture. Pterygoid plates are normal. Mandibular condyles are located. No mandibular fracture is. Orbits: Globes are normal.  Intraconal contents are normal. Sinuses: Interval clearing of hematoma from the ethmoid air cells and posterior nasopharynx from comparison CT. No evidence fluid in the paranasal sinuses. Soft tissues: Soft tissue thickening over the RIGHT orbit. No fracture. CT CERVICAL SPINE FINDINGS Alignment: Normal alignment of the cervical vertebral bodies. Skull base and vertebrae: Normal craniocervical junction. No loss of vertebral body height or disc height. Normal facet articulation. No evidence of fracture. Soft tissues and spinal canal: No prevertebral soft tissue swelling. No perispinal or epidural hematoma. Disc levels:  Unremarkable Upper chest:  Centrilobular emphysema the upper lobes. Pleuroparenchymal thickening at the LEFT upper lobe appears benign. Other: None IMPRESSION: 1. No acute intracranial findings. 2. Soft tissue injury over the RIGHT orbit. No orbital fracture. No injury to the orbit. 3. No cervical spine fracture. 4. Scarring and pleural thickening at the lung apices. Electronically Signed   By: Suzy Bouchard M.D.   On: 09/27/2020 13:38      IMPRESSION AND PLAN:   1.  Acute hypoxemic respiratory failure secondary to COVID-19 with subsequent presyncope. -The patient will be admitted to a medically monitored isolation bed. -O2 protocol will be  followed to keep O2 saturation above 93. -We will follow orthostatics. -The patient will be hydrated with IV normal saline. -She will be monitored for arrhythmias.   2.  Suspected  pneumonia secondary to COVID-19. -The patient will be admitted to an isolation monitored bed with droplet and contact precautions. -Given multifocal pneumonia we will empirically place the patient on IV Rocephin and Zithromax for possible bacterial superinfection only with elevated Procalcitonin. -The patient will be placed on scheduled Mucinex and as needed Tussionex. -We will avoid nebulization as much as we can, give bronchodilator MDI if needed, and with deterioration of oxygenation try to avoid BiPAP/CPAP if possible.    -Will obtain sputum Gram stain culture and sensitivity and follow blood cultures. -O2 protocol will be followed. -We will follow CRP, ferritin, LDH and D-dimer. -Will follow manual differential for ANC/ALC ratio as well as follow troponin I and daily CBC with manual differential and CMP. - Will place the patient on IV Remdesivir and IV steroid therapy with IV Solu-Medrol with elevated inflammatory markers. -The patient will be placed on vitamin D3, vitamin C, zinc sulfate, p.o. Pepcid and aspirin.  3.  Hypothyroidism. -We will continue Synthroid and check TSH.  4.  Type 2 diabetes mellitus. -The patient will be placed on supplement coverage with NovoLog and will continue basal coverage and Actos.  5.  Coronary artery disease. -We will continue Toprol-XL and aspirin.  6.  Dyslipidemia. -Statin therapy will be resumed.  7.  Essential hypertension. -We will hold off thiazides and continue Toprol-XL.  8.  DVT prophylaxis. -Subcutaneous Lovenox   All the records are reviewed and case discussed with ED provider. The plan of care was discussed in details with the patient (and family). I answered all questions. The patient agreed to proceed with the above mentioned plan. Further  management will depend upon hospital course.   CODE STATUS: Full code  Status is: Inpatient  Remains inpatient appropriate because:Ongoing diagnostic testing needed not appropriate for outpatient work up, Unsafe d/c plan, IV treatments appropriate due to intensity of illness or inability to take PO and Inpatient level of care appropriate due to severity of illness   Dispo: The patient is from: SNF              Anticipated d/c is to: SNF              Anticipated d/c date is: > 3 days              Patient currently is not medically stable to d/c.   Difficult to place patient No  TOTAL TIME TAKING CARE OF THIS PATIENT: 55 minutes.    Christel Mormon M.D on 09/28/2020 at 3:19 AM  [facility]   From 7 PM-7 AM, contact night-coverage [email or web address]  CC: Primary care physician; Idelle Crouch, MD

## 2020-09-28 NOTE — ED Notes (Signed)
Pt's husband attempted to be called for update; no answer.

## 2020-09-28 NOTE — Progress Notes (Signed)
Remdesivir - Pharmacy Brief Note   O:  CXR: "Shallow inspiration with atelectasis in the lung bases. Emphysematous changes and fibrosis." SpO2: 98-100% on 2L/min via [place]   A/P:  Remdesivir 200 mg IVPB once followed by 100 mg IVPB daily x 4 days.   Renda Rolls, PharmD, [facility] 09/28/2020 2:42 AM

## 2020-09-28 NOTE — ED Notes (Signed)
Patient now noted to be 88% room air, placed on 3Lnc.

## 2020-09-28 NOTE — ED Notes (Signed)
Pacemaker interrogated using [employer] system. Data sent.

## 2020-09-28 NOTE — Progress Notes (Signed)
  PROGRESS NOTE    Cassandra [name]  UDJ:[id number] DOB: 09/13/1932 DOA: 09/28/2020  PCP: Cassandra Crouch, MD    LOS - 0    Same day of admission rounding note  Patient admitted earlier this morning with Covid-19 infection with hypoxia and near syncope.  Interval subjective: Pt feeling a little better.  Says she falls a lot and isn't sure why.  Denies having prodromal symptoms.  Says sensation in her feet is fine.  Exam: Right periorbital ecchymosis with mild swelling, patient awake alert and oriented x3.  Symmetric chest rise and normal respiratory effort.  On 2 L/min nasal cannula oxygen.  Abdomen soft and nontender.  No lower extremity edema.   Active Problems:   Acute hypoxemic respiratory failure due to COVID-19 [facility])    I have reviewed the full H&P by Dr. Sidney Ace in detail, and I agree with the assessment and plan as outlined therein. In addition: --Recurrent falls - PT and OT evaluations --D/C fluids, pt eating and drinking well --follow up orthostatic vitals   No Charge    Ezekiel Slocumb, DO [facility]   If 7PM-7AM, please contact night-coverage [email or web address] 09/28/2020, 8:05 AM

## 2020-09-29 DIAGNOSIS — U071 COVID-19: Secondary | ICD-10-CM | POA: Diagnosis not present

## 2020-09-29 DIAGNOSIS — J9601 Acute respiratory failure with hypoxia: Secondary | ICD-10-CM | POA: Diagnosis not present

## 2020-09-29 LAB — COMPREHENSIVE METABOLIC PANEL
ALT: 8 U/L (ref 0–44)
AST: 12 U/L — ABNORMAL LOW (ref 15–41)
Albumin: 3 g/dL — ABNORMAL LOW (ref 3.5–5.0)
Alkaline Phosphatase: 49 U/L (ref 38–126)
Anion gap: 11 (ref 5–15)
BUN: 34 mg/dL — ABNORMAL HIGH (ref 8–23)
CO2: 26 mmol/L (ref 22–32)
Calcium: 9.1 mg/dL (ref 8.9–10.3)
Chloride: 102 mmol/L (ref 98–111)
Creatinine, Ser: 0.93 mg/dL (ref 0.44–1.00)
GFR, Estimated: 60 mL/min — ABNORMAL LOW (ref >60)
Glucose, Bld: 196 mg/dL — ABNORMAL HIGH (ref 70–99)
Potassium: 4.7 mmol/L (ref 3.5–5.1)
Sodium: 139 mmol/L (ref 135–145)
Total Bilirubin: 0.5 mg/dL (ref 0.3–1.2)
Total Protein: 7.1 g/dL (ref 6.5–8.1)

## 2020-09-29 LAB — CBC WITH DIFFERENTIAL/PLATELET
Abs Immature Granulocytes: 0.1 10*3/uL — ABNORMAL HIGH (ref 0.00–0.07)
Basophils Absolute: 0 10*3/uL (ref 0.0–0.1)
Basophils Relative: 0 %
Eosinophils Absolute: 0 10*3/uL (ref 0.0–0.5)
Eosinophils Relative: 0 %
HCT: 34.3 % — ABNORMAL LOW (ref 36.0–46.0)
Hemoglobin: 10.8 g/dL — ABNORMAL LOW (ref 12.0–15.0)
Immature Granulocytes: 1 %
Lymphocytes Relative: 7 %
Lymphs Abs: 1 10*3/uL (ref 0.7–4.0)
MCH: 27.4 pg (ref 26.0–34.0)
MCHC: 31.5 g/dL (ref 30.0–36.0)
MCV: 87.1 fL (ref 80.0–100.0)
Monocytes Absolute: 0.5 10*3/uL (ref 0.1–1.0)
Monocytes Relative: 4 %
Neutro Abs: 11.5 10*3/uL — ABNORMAL HIGH (ref 1.7–7.7)
Neutrophils Relative %: 88 %
Platelets: 364 10*3/uL (ref 150–400)
RBC: 3.94 MIL/uL (ref 3.87–5.11)
RDW: 15.5 % (ref 11.5–15.5)
WBC: 13 10*3/uL — ABNORMAL HIGH (ref 4.0–10.5)
nRBC: 0 % (ref 0.0–0.2)

## 2020-09-29 LAB — GLUCOSE, CAPILLARY
Glucose-Capillary: 223 mg/dL — ABNORMAL HIGH (ref 70–99)
Glucose-Capillary: 313 mg/dL — ABNORMAL HIGH (ref 70–99)
Glucose-Capillary: 304 mg/dL — ABNORMAL HIGH (ref 70–99)
Glucose-Capillary: 257 mg/dL — ABNORMAL HIGH (ref 70–99)
Glucose-Capillary: 207 mg/dL — ABNORMAL HIGH (ref 70–99)

## 2020-09-29 LAB — C-REACTIVE PROTEIN: CRP: 0.6 mg/dL (ref <1.0)

## 2020-09-29 LAB — FIBRIN DERIVATIVES D-DIMER (ARMC ONLY): Fibrin derivatives D-dimer (ARMC): 914.47 ng/mL (FEU) — ABNORMAL HIGH (ref 0.00–499.00)

## 2020-09-29 MED ORDER — QUETIAPINE FUMARATE 25 MG PO TABS
25.0000 mg | ORAL_TABLET | Freq: Every day | ORAL | Status: DC
  Administered 2020-09-29: 20:00:00 25 mg via ORAL
  Filled 2020-09-29: qty 1

## 2020-09-29 MED ORDER — ENOXAPARIN SODIUM 40 MG/0.4ML ~~LOC~~ SOLN
40.0000 mg | SUBCUTANEOUS | Status: DC
  Administered 2020-09-30: 40 mg via SUBCUTANEOUS
  Filled 2020-09-29: qty 0.4

## 2020-09-29 MED ORDER — INSULIN ASPART 100 UNIT/ML ~~LOC~~ SOLN
0.0000 [IU] | Freq: Three times a day (TID) | SUBCUTANEOUS | Status: DC
  Administered 2020-09-29: 17:00:00 3 [IU] via SUBCUTANEOUS
  Administered 2020-09-30: 13:00:00 4 [IU] via SUBCUTANEOUS
  Administered 2020-09-30: 3 [IU] via SUBCUTANEOUS
  Filled 2020-09-29 (×3): qty 1

## 2020-09-29 MED ORDER — INSULIN ASPART 100 UNIT/ML ~~LOC~~ SOLN
5.0000 [IU] | Freq: Three times a day (TID) | SUBCUTANEOUS | Status: DC
  Administered 2020-09-29 – 2020-09-30 (×3): 5 [IU] via SUBCUTANEOUS
  Filled 2020-09-29 (×3): qty 1

## 2020-09-29 NOTE — Progress Notes (Signed)
Inpatient Diabetes Program Recommendations  AACE/ADA: New Consensus Statement on Inpatient Glycemic Control (2015)  Target Ranges:  Prepandial:   less than 140 mg/dL      Peak postprandial:   less than 180 mg/dL (1-2 hours)      Critically ill patients:  140 - 180 mg/dL   Lab Results  Component Value Date   GLUCAP 313 (H) 09/29/2020   HGBA1C 8.2 (H) 09/28/2020    Review of Glycemic Control Results for Cassandra [name], Cassandra [name] (MRN [id number]) as of 09/29/2020 11:36  Ref. Range 09/28/2020 08:05 09/28/2020 12:30 09/28/2020 18:49 09/28/2020 20:58 09/29/2020 07:35 09/29/2020 10:24  Glucose-Capillary Latest Ref Range: 70 - 99 mg/dL 126 (H) 223 (H) 203 (H) 156 (H) 223 (H) 313 (H)   Diabetes history: DM2 Outpatient Diabetes medications: Lantus 12 units q hs + Humalog 10 units tid + Actos 30 mg qd Current orders for Inpatient glycemic control: Lantus 12 units qd +Novolog 0-15 units qid + Solumedrol 69 mg bid then transition to Prednisone 50 mg qd starting 1/30  Inpatient Diabetes Program Recommendations:   -Add Novolog 5 units tid meal coverage if eats 50% -Decrease hs Novolog correction to 0-5 units Secure chat sent to Dr. Nicole Kindred.  Thank you, Nani Gasser. Chanon Loney, RN, MSN, CDE  Diabetes Coordinator Inpatient Glycemic Control Team Team Pager [phone] (8am-5pm) 09/29/2020 11:43 AM

## 2020-09-29 NOTE — Progress Notes (Signed)
PROGRESS NOTE    Cassandra [name]   DUK:[id number]  DOB: April 02, 1933  PCP: Idelle Crouch, MD    DOA: 09/28/2020 LOS: 1   Brief Narrative / Hospital Course to-date   87 y.o. female with a history of hypothyroidism, hypertension, coronary artery disease, depression, type 2 diabetes mellitus and dyslipidemia, who presented to the ED on 09/28/20 with worsening dyspnea, nonproductive cough, diarrhea as well as vertigo and presyncope that led to a fall and right periorbital contusion.    Patient's spO2 was in upper 80's on room air, improved to mid 90's on 2 L/min oxygen.  Covid-19 PCR was positive.  Chest xray negative for infiltrates, but showed emphysematous changes and fibrosis, and shallow inspirations with bibasilar atelectasis.  Admitted to hospitalist service.  Started on remdesivir and IV steroids.    Significant Events: - 09/28/20 admitted, on 2 L/min oxygen  Date of +Covid Test: 09/28/20  Vaccination status: 2 doses reported  Assessment & Plan   Active Problems:   Acute hypoxemic respiratory failure due to COVID-19 [facility])   Acute respiratory failure with hypoxia secondary to COVID-19 infection -patient presented with dyspnea, hypoxia and confirmed Covid positive.  Requiring 2 L/min nasal cannula oxygen. Chest x-ray did not show any clear infiltrates however bibasilar atelectasis can mask infiltrate.  She also has emphysematous changes on her x-ray and likely has underlying COPD with some exacerbation. --Continue remdesivir and steroids --Supportive care with antitussives, bronchodilators --Follow inflammatory markers, CMP, CBC daily --Continue vitamins, Pepcid, aspirin --O2 to maintain sats 88 to 93%, wean as tolerated --Mobilize, up in chair as tolerated --Prone or side-lying when in bed is much as possible --Follow cultures obtained on admission   Presyncope -leading to fall and right periorbital contusion.  Suspect this is due to relative dehydration and hypoxia  in the setting of Covid infection.  Monitor on telemetry.  Recurrent falls -patient reportedly had 3 falls in the past 6 months.  PT and OT evaluations.   Orthostatic vitals were negative.  Patient only has history of vertigo which may contribute to falls.  Steroid-induced hyperglycemia Type 2 diabetes -uncontrolled, A1c this admission is 8.2%. --sliding scale NovoLog --basal insulin --titrate insulin for inpatient CBG goal goal 140-180 --Diabetes coordinator following, recs appreciated  Sick sinus syndrome -status post pacemaker.  No acute issues.  Monitor telemetry. Pacemaker was interrogated a data sent to [employer] in the ED.  Coronary artery disease -stable, patient without any chest pain.  Continue metoprolol and aspirin.  Hyperlipidemia -continue statin  Essential hypertension -continue home regimen with Maxzide and Toprol-XL  Hypothyroidism -continue Synthroid.  TSH was checked on admission and normal.  Depression/anxiety -continue home Valium, Paxil and Seroquel  DVT prophylaxis: enoxaparin (LOVENOX) injection 40 mg Start: 09/30/20 1000   Diet:  Diet Orders (From admission, onward)    Start     Ordered   09/28/20 0339  Diet Carb Modified Fluid consistency: Thin; Room service appropriate? Yes  Diet effective now       Question Answer Comment  Diet-HS Snack? Nothing   Calorie Level Medium 1600-2000   Fluid consistency: Thin   Room service appropriate? Yes      09/28/20 0338            Code Status: Full Code    Subjective 09/29/20    Patient up in chair when seen today.  She reports feeling fairly well.  Asks for some water or tea to drink.  States she feels nasal congestion due to breaking her  nose and a fall not too long ago.  States she hopes to get home tomorrow.   Disposition Plan & Communication   Status is: Inpatient  Remains inpatient appropriate because:Inpatient level of care appropriate due to severity of illness, receiving IV therapies for  COVID-19 infection and still requiring supplemental oxygen, weaning.   Dispo:  Patient From: Home  Planned Disposition: Home  Expected discharge date: 09/30/2020  Medically stable for discharge: No      Family Communication: called patient's husband this afternoon, updated on potential discharge home tomorrow with [facility] PT and maybe oxygen if not weaned off yet.  He is agreeable.   Consults, Procedures, Treatments   Consultants:   None  Procedures:   None  Covid-specific treatments:  Antimicrobials:  Anti-infectives (From admission, onward)   Start     Dose/Rate Route Frequency Ordered Stop   09/29/20 1000  remdesivir 100 mg in sodium chloride 0.9 % 100 mL IVPB       "Followed by" Linked Group Details   100 mg 200 mL/hr over 30 Minutes Intravenous Daily 09/28/20 0244 10/03/20 0959   09/28/20 0330  remdesivir 200 mg in sodium chloride 0.9% 250 mL IVPB       "Followed by" Linked Group Details   200 mg 580 mL/hr over 30 Minutes Intravenous Once 09/28/20 0244 09/28/20 0826        Objective   Vitals:   09/29/20 0636 09/29/20 0642 09/29/20 0645 09/29/20 0737  BP: (!) 158/54 (!) 116/103 (!) 157/74 (!) 135/43  Pulse: 65 77 83 60  Resp:  20  20  Temp: (!) 97.5 F (36.4 C)   97.8 F (36.6 C)  TempSrc: Oral     SpO2: 99% 94% 99% 91%  Weight:      Height:        Intake/Output Summary (Last 24 hours) at 09/29/2020 1438 Last data filed at 09/29/2020 0500 Gross per 24 hour  Intake --  Output 700 ml  Net -700 ml   Filed Weights   09/27/20 1228  Weight: 68.9 kg    Physical Exam:  General exam: awake, alert, no acute distress HEENT: right periorbital ecchymosis is resolving, moist mucus membranes, hearing grossly normal  Respiratory system: decreased breath sounds, no wheezes or rhonchi, normal respiratory effort at rest, on 2 L/min [place]. Cardiovascular system: normal S1/S2, RRR, no pedal edema.   Central nervous system: A&O x4. no gross focal neurologic deficits,  normal speech Extremities: moves all, no cyanosis, normal tone Skin: dry, intact, normal temperature Psychiatry: normal mood, congruent affect, judgement and insight appear normal  Labs   Data Reviewed: I have personally reviewed following labs and imaging studies  CBC: Recent Labs  Lab 09/27/20 1230 09/29/20 0441  WBC 7.8 13.0*  NEUTROABS  --  11.5*  HGB 10.8* 10.8*  HCT 33.8* 34.3*  MCV 87.8 87.1  PLT 368 017   Basic Metabolic Panel: Recent Labs  Lab 09/27/20 1230 09/29/20 0441  NA 139 139  K 4.8 4.7  CL 103 102  CO2 24 26  GLUCOSE 163* 196*  BUN 38* 34*  CREATININE 1.58* 0.93  CALCIUM 9.2 9.1   GFR: Estimated Creatinine Clearance: 39.5 mL/min (by C-G formula based on SCr of 0.93 mg/dL). Liver Function Tests: Recent Labs  Lab 09/29/20 0441  AST 12*  ALT 8  ALKPHOS 49  BILITOT 0.5  PROT 7.1  ALBUMIN 3.0*   No results for input(s): LIPASE, AMYLASE in the last 168 hours. No results for input(s):  AMMONIA in the last 168 hours. Coagulation Profile: No results for input(s): INR, PROTIME in the last 168 hours. Cardiac Enzymes: No results for input(s): CKTOTAL, CKMB, CKMBINDEX, TROPONINI in the last 168 hours. BNP (last 3 results) No results for input(s): PROBNP in the last 8760 hours. HbA1C: Recent Labs    09/28/20 0530  HGBA1C 8.2*   CBG: Recent Labs  Lab 09/28/20 1849 09/28/20 2058 09/29/20 0735 09/29/20 1024 09/29/20 1308  GLUCAP 203* 156* 223* 313* 304*   Lipid Profile: No results for input(s): CHOL, HDL, LDLCALC, TRIG, CHOLHDL, LDLDIRECT in the last 72 hours. Thyroid Function Tests: Recent Labs    09/28/20 0530  TSH 0.440   Anemia Panel: Recent Labs    09/28/20 0530  FERRITIN 89   Sepsis Labs: Recent Labs  Lab 09/28/20 0530  PROCALCITON <0.10    Recent Results (from the past 240 hour(s))  SARS Coronavirus 2 by RT PCR (hospital order, performed in [facility] hospital lab) Nasopharyngeal Nasopharyngeal Swab     Status:  Abnormal   Collection Time: 09/28/20 12:57 AM   Specimen: Nasopharyngeal Swab  Result Value Ref Range Status   SARS Coronavirus 2 POSITIVE (A) NEGATIVE Final    Comment: RESULT CALLED TO, READ BACK BY AND VERIFIED WITH: Melanie Crazier @0222  09/28/20 RH (NOTE) SARS-CoV-2 target nucleic acids are DETECTED  SARS-CoV-2 RNA is generally detectable in upper respiratory specimens  during the acute phase of infection.  Positive results are indicative  of the presence of the identified virus, but do not rule out bacterial infection or co-infection with other pathogens not detected by the test.  Clinical correlation with patient history and  other diagnostic information is necessary to determine patient infection status.  The expected result is negative.  Fact Sheet for Patients:   StrictlyIdeas.no   Fact Sheet for Healthcare Providers:   BankingDealers.co.za    This test is not yet approved or cleared by the [place] FDA and  has been authorized for detection and/or diagnosis of SARS-CoV-2 by FDA under an Emergency Use Authorization (EUA).  This EUA will remain in effect (meaning this test  can be used) for the duration of  the COVID-19 declaration under Section 564(b)(1) of the Act, 21 U.S.C. section 360-bbb-3(b)(1), unless the authorization is terminated or revoked sooner.  Performed at [facility], [address], [place], [place] [address]       Imaging Studies   DG Chest 2 View  Result Date: 09/28/2020 CLINICAL DATA:  Shortness of breath EXAM: CHEST - 2 VIEW COMPARISON:  07/21/2020 FINDINGS: Cardiac pacemaker. Shallow inspiration with atelectasis in the lung bases. Mild cardiac enlargement. Emphysematous changes and fibrosis in the lungs. No definite consolidation or effusion. Degenerative changes in the spine. Lower thoracic kyphoplasty. IMPRESSION: Shallow inspiration with atelectasis in the lung bases.  Emphysematous changes and fibrosis. Electronically Signed   By: Lucienne Capers M.D.   On: 09/28/2020 01:28     Medications   Scheduled Meds: . vitamin C  500 mg Oral Daily  . aspirin EC  81 mg Oral Daily  . cholecalciferol  1,000 Units Oral Daily  . clopidogrel  75 mg Oral Daily  . darifenacin  7.5 mg Oral Daily  . diazepam  2 mg Oral BID  . [START ON 09/30/2020] enoxaparin (LOVENOX) injection  40 mg Subcutaneous Q24H  . famotidine  10 mg Oral Daily  . guaiFENesin  600 mg Oral BID  . insulin aspart  0-6 Units Subcutaneous TID WC  . insulin  aspart  5 Units Subcutaneous TID WC  . insulin glargine  12 Units Subcutaneous QHS  . levothyroxine  112 mcg Oral QAC breakfast  . magnesium oxide  400 mg Oral QHS  . methylPREDNISolone (SOLU-MEDROL) injection  1 mg/kg Intravenous Q12H   Followed by  . [START ON 10/01/2020] predniSONE  50 mg Oral Daily  . metoprolol succinate  25 mg Oral Daily  . multivitamin with minerals  1 tablet Oral Daily  . PARoxetine  10 mg Oral Daily  . pramipexole  0.5 mg Oral QHS  . QUEtiapine  25 mg Oral QHS  . rosuvastatin  10 mg Oral QHS  . triamterene-hydrochlorothiazide  1 tablet Oral Daily  . zinc sulfate  220 mg Oral Daily   Continuous Infusions: . remdesivir 100 mg in NS 100 mL 100 mg (09/29/20 1037)       LOS: 1 day    Time spent: 30 minutes    Ezekiel Slocumb, DO [facility]  09/29/2020, 2:38 PM    If 7PM-7AM, please contact night-coverage. How to contact the [facility] Attending or Consulting provider [place] or covering provider during after hours [place], for this patient?    1. Check the care team in [facility] and look for a) attending/consulting TRH provider listed and b) the [facility] team listed 2. Log into [email or web address] and use [place]'s universal password to access. If you do not have the password, please contact the hospital operator. 3. Locate the [facility] provider you are looking for under [facility] and page to a number that you  can be directly reached. 4. If you still have difficulty reaching the provider, please page the [facility] (Director on Call) for the Hospitalists listed on amion for assistance.

## 2020-09-29 NOTE — Hospital Course (Addendum)
87 y.o. female with a history of hypothyroidism, hypertension, coronary artery disease, depression, type 2 diabetes mellitus and dyslipidemia, who presented to the ED on 09/28/20 with worsening dyspnea, nonproductive cough, diarrhea as well as vertigo and presyncope that led to a fall and right periorbital contusion.    Patient's spO2 was in upper 80's on room air, improved to mid 90's on 2 L/min oxygen.  Covid-19 PCR was positive.  Chest xray negative for infiltrates, but showed emphysematous changes and fibrosis, and shallow inspirations with bibasilar atelectasis.  Admitted to hospitalist service.  Started on remdesivir and IV steroids.

## 2020-09-29 NOTE — TOC Initial Note (Signed)
Transition of Care [facility]) - Initial/Assessment Note    Patient Details  Name: Cassandra [name] MRN: [id number] Date of Birth: 06/15/1933  Transition of Care [facility]) CM/SW Contact:    Shelbie Hutching, RN Phone Number: 09/29/2020, 4:04 PM  Clinical Narrative:                 Patient admitted with COVID currently requiring [place] 2L.  Patient reports that she has used oxygen at home in the past but cannot remember the company.  Patient is from home with her husband.  Patient walks with a walker all the time.  Her daughter can provide transportation for her.  Patient is current with PCP.  Patient agrees to home health through [facility].  Corene Cornea accepted referral for RN, PT, and OT.    Expected Discharge Plan: [place] Barriers to Discharge: Continued Medical Work up   Patient Goals and CMS Choice Patient states their goals for this hospitalization and ongoing recovery are:: Would like to go home and agrees to home health services CMS Medicare.gov Compare Post Acute Care list provided to:: Patient Choice offered to / list presented to : Patient  Expected Discharge Plan and Services Expected Discharge Plan: [place]   Discharge Planning Services: CM Consult Post Acute Care Choice: [place] arrangements for the past 2 months: Apartment                           HH Arranged: RN,PT,OT [place] Agency: [place] ([place]) Date [place]: 09/29/20 Time [place]: 1603 Representative spoke with at [place]: Corene Cornea  Prior Living Arrangements/Services Living arrangements for the past 2 months: [place] with:: Spouse Patient language and need for interpreter reviewed:: Yes Do you feel safe going back to the place where you live?: Yes      Need for Family Participation in Patient Care: Yes (Comment) (COVID) Care giver support system in place?: Yes (comment) (husband and daughter) Current home services: DME  (walker, cane, wheelchair) Criminal Activity/Legal Involvement Pertinent to Current Situation/Hospitalization: No - Comment as needed  Activities of Daily Living Home Assistive Devices/Equipment: Engineer, drilling (specify type) ADL Screening (condition at time of admission) Patient's cognitive ability adequate to safely complete daily activities?: Yes Is the patient deaf or have difficulty hearing?: Yes Does the patient have difficulty seeing, even when wearing glasses/contacts?: No Does the patient have difficulty concentrating, remembering, or making decisions?: No Patient able to express need for assistance with ADLs?: Yes Does the patient have difficulty dressing or bathing?: No Independently performs ADLs?: Yes (appropriate for developmental age) Does the patient have difficulty walking or climbing stairs?: Yes Weakness of Legs: Both Weakness of Arms/Hands: None  Permission Sought/Granted Permission sought to share information with : Case Manager,Family Supports,Other (comment) Permission granted to share information with : Yes, Verbal Permission Granted  Share Information with NAME: Arby [place] and [place] granted to share info w AGENCY: [facility]  Permission granted to share info w Relationship: daughter and husband     Emotional Assessment   Attitude/Demeanor/Rapport: Engaged Affect (typically observed): Accepting Orientation: : Oriented to Self,Oriented to Place,Oriented to  Time,Oriented to Situation Alcohol / Substance Use: Not Applicable Psych Involvement: No (comment)  Admission diagnosis:  Acute respiratory failure with hypoxia (HCC) [J96.01] Generalized weakness [R53.1] Near syncope [R55] Acute hypoxemic respiratory failure due to COVID-19 ([place]) [U07.1, J96.01] COVID-19 [U07.1] Patient Active Problem List   Diagnosis Date  Noted  . Acute hypoxemic respiratory failure due to COVID-19 ([place]) 09/28/2020  . Urge incontinence 07/16/2020  . History of  nephrolithiasis 07/16/2020  . 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 Acute vomiting 11/01/2019  . Dizziness 11/01/2019  . Falls frequently 11/01/2019  . Visual hallucinations 11/01/2019  . Loss of weight 10/21/2019  . Carotid stenosis 10/20/2019  . Pacemaker 10/08/2019  . Moderate episode of recurrent major depressive disorder ([place]) 12/14/2018  . Near syncope 10/04/2018  . Non-STEMI (non-ST elevated myocardial infarction) ([place]) 08/18/2018  . Abnormal UGI series 03/31/2018  . Lymphedema 03/01/2018  . Acute respiratory failure with hypoxia ([place]) 09/16/2017  . Sepsis ([place]) 08/17/2017  . UTI (urinary tract infection) 08/17/2017  . Acute respiratory distress 08/17/2017  . 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 Sick sinus syndrome ([place]) 07/03/2016  . New onset atrial flutter ([place]) 06/14/2016  . Bradycardia, sinus 06/07/2016  . Syncope 06/06/2016  . Chest pain 04/21/2016  . Elevated troponin 04/21/2016  . Labile hypertension 04/21/2016  . Ischemic chest pain ([place]) 04/21/2016  . Long-term insulin use ([place]) 05/24/2014  . Mild vitamin D deficiency 05/20/2014  . Vitamin D deficiency 05/20/2014  . B12 deficiency 02/15/2014  . Acquired hypothyroidism 01/06/2014  . Chronic airway obstruction, not elsewhere classified 01/06/2014  . CAD (coronary artery disease), native coronary artery 01/06/2014  . Benign essential hypertension 01/06/2014  . Headache 01/06/2014  . Hypersomnia with sleep apnea 01/06/2014  . Pure hypercholesterolemia 01/06/2014  . Type II diabetes mellitus with manifestations ([place]) 01/06/2014  . Hyperlipidemia associated with type 2 diabetes mellitus ([place]) 01/06/2014   PCP:  Idelle Crouch, MD Pharmacy:   [facility], [place] [address] Phone: [phone] Fax: [phone]  [place], [place] [address] Phone:  [phone] Fax: [phone]     Social Determinants of Health (SDOH) Interventions    Readmission Risk Interventions No flowsheet data found.

## 2020-09-29 NOTE — Evaluation (Signed)
Physical Therapy Evaluation Patient Details Name: Cassandra [name] MRN: [id number] DOB: 1932-11-22 Today's Date: 09/29/2020   History of Present Illness  Pt is an 87 y.o. Caucasian female with a known history of hypothyroidism, hypertension, coronary artery disease, depression, type diabetes mellitus and dyslipidemia, who presented to the emergency room with acute onset of worsening dyspnea with associated nonproductive cough and diarrhea as well as vertigo and presyncope.    Clinical Impression  Pt alert, agreeable to PT, denied pain, oriented. Pt reported at baseline she is modI/I for ADLs, husband assists as needed and helps with IADLs. Pt ambulatory with walker, 3 falls in the last 6 months.  The patient demonstrated supine to sit with minA ultimately to assist with weight shifting to EOB. She was able to sit for several minutes with supervision once positioned in midline. SpO2 >90% throughout on 3L, BP assessed and elevated (200/91) but pt very motivated to transfer to chair and reading questionable due to pt movement. Pt also declined use of RW and stood and pivoted to recliner with handheld assist and minA. Repositioned with all needs in reach, including breakfast. RN notified of pt elevated BP reading.  Overall the patient demonstrated deficits (see "PT Problem List") that impede the patient's functional abilities, safety, and mobility and would benefit from skilled PT intervention. Recommendation is HHPT with supervision intermittently as well as for mobility/OOB.      Follow Up Recommendations Home health PT;Supervision for mobility/OOB;Supervision - Intermittent    Equipment Recommendations  None recommended by PT (pt has RW and cane at home)    Recommendations for Other Services       Precautions / Restrictions Precautions Precautions: Fall Restrictions Weight Bearing Restrictions: No      Mobility  Bed Mobility Overal bed mobility: Needs Assistance Bed Mobility: Supine  to Sit     Supine to sit: Min assist;HOB elevated     General bed mobility comments: light minA to assist pt with weight shift towards EOB in preparation for standing    Transfers Overall transfer level: Needs assistance Equipment used: 1 person hand held assist Transfers: Sit to/from [facility] Sit to Stand: Min assist Stand pivot transfers: Min assist       General transfer comment: Pt eager to get to chair to eat breakfast, declined using RW. minA handheld assist to stand, and stand pivot to recliner in room  Ambulation/Gait             General Gait Details: declined true ambulation due to wanting to eat breakfast  Stairs            Wheelchair Mobility    Modified Rankin (Stroke Patients Only)       Balance Overall balance assessment: Needs assistance Sitting-balance support: Feet supported Sitting balance-Leahy Scale: Good       Standing balance-Leahy Scale: Poor Standing balance comment: reliant on handheld assist and recliner chair arms                             Pertinent Vitals/Pain Pain Assessment: No/denies pain    Home Living Family/patient expects to be discharged to:: Private residence Living Arrangements: Spouse/significant other Available Help at Discharge: Family;Available PRN/intermittently (pt reported her husband is retired but does work part time) Type of Home: Apartment ((condo)) Home Access: Level entry     [place]: One [place]: Environmental consultant - 2 wheels;Walker - 4 wheels;Grab bars - tub/shower;Cane - single point  Prior Function Level of Independence: Needs assistance   Gait / Transfers Assistance Needed: pt does not normally need physical assist to get out of bed/ambulation. Does use a walker  ADL's / Homemaking Assistance Needed: pt assists with homemaking as able, husband drives. Pt does go shopping, reported modI/I for ADLs  Comments: 3 falls in the last 6 months, pt unsure  of what causes them     Hand Dominance   Dominant Hand: Left    Extremity/Trunk Assessment   Upper Extremity Assessment Upper Extremity Assessment: Overall WFL for tasks assessed    Lower Extremity Assessment Lower Extremity Assessment: Generalized weakness    Cervical / Trunk Assessment Cervical / Trunk Assessment: Normal  Communication   Communication: No difficulties  Cognition Arousal/Alertness: Awake/alert Behavior During Therapy: WFL for tasks assessed/performed Overall Cognitive Status: Within Functional Limits for tasks assessed                                        General Comments General comments (skin integrity, edema, etc.): pt on 3L via [place] throughout session    Exercises     Assessment/Plan    PT Assessment Patient needs continued PT services  PT Problem List Decreased mobility;Decreased activity tolerance;Decreased balance       PT Treatment Interventions DME instruction;Therapeutic exercise;Gait training;Balance training;Neuromuscular re-education;Stair training;Functional mobility training;Patient/family education;Therapeutic activities    PT Goals (Current goals can be found in the Care Plan section)  Acute Rehab PT Goals Patient Stated Goal: to go home PT Goal Formulation: With patient Time For Goal Achievement: 10/13/20 Potential to Achieve Goals: Good    Frequency Min 2X/week   Barriers to discharge        Co-evaluation               AM-PAC PT "6 Clicks" Mobility  Outcome Measure Help needed turning from your back to your side while in a flat bed without using bedrails?: None Help needed moving from lying on your back to sitting on the side of a flat bed without using bedrails?: A Little Help needed moving to and from a bed to a chair (including a wheelchair)?: A Little Help needed standing up from a chair using your arms (e.g., wheelchair or bedside chair)?: A Little Help needed to walk in hospital room?: A  Little Help needed climbing 3-5 steps with a railing? : A Lot 6 Click Score: 18    End of Session Equipment Utilized During Treatment: Oxygen (3L) Activity Tolerance: Patient tolerated treatment well Patient left: in chair;with chair alarm set;with call bell/phone within reach Nurse Communication: Mobility status PT Visit Diagnosis: Other abnormalities of gait and mobility (R26.89);Muscle weakness (generalized) (M62.81);Difficulty in walking, not elsewhere classified (R26.2)    Time: 2952-8413 PT Time Calculation (min) (ACUTE ONLY): 22 min   Charges:   PT Evaluation $PT Eval Low Complexity: 1 Low PT Treatments $Therapeutic Activity: 8-22 mins        Lieutenant Diego PT, DPT 10:15 AM,09/29/20

## 2020-09-29 NOTE — Evaluation (Signed)
Occupational Therapy Evaluation Patient Details Name: Cassandra [name] MRN: [id number] DOB: [birth year] Today's Date: 09/29/2020    History of Present Illness Pt is an 87 y.o. Caucasian female with a known history of hypothyroidism, hypertension, coronary artery disease, depression, type diabetes mellitus and dyslipidemia, who presented to the emergency room with acute onset of worsening dyspnea with associated nonproductive cough and diarrhea as well as vertigo and presyncope.   Clinical Impression   Pt seen for OT evaluation this date in setting of acute hospitalization d/t falls and incidentally found to have COVID. Pt reports being INDEP with basic self care at baseline, MOD I with RW for fxl mobility. reports some help for IADLs such as transportation, but reports she is usually able to do things like shop for groceries and cook. This date on assessment, pt is on 3Lnc and demos decreased fxl activity tolerance, some weakness, and she is fearful of falling again which is impacting her ability to complete standing fxl ADLs, ADL mobility and fxl transfers. Pt requires MIN A with seated LB ADLs, SETUP with seated UB ADLs, MIN A with transfers/fxl mobility with RW. Anticipate pt will require OT f/u both while in acute setting as well as upon d/c from hospital in her home setting to ensure safety with ADLs.     Follow Up Recommendations  Home health OT;Supervision/Assistance - 24 hour    Equipment Recommendations  3 in 1 bedside commode;Tub/shower seat    Recommendations for Other Services       Precautions / Restrictions Precautions Precautions: Fall Restrictions Weight Bearing Restrictions: No      Mobility Bed Mobility Overal bed mobility: Needs Assistance Bed Mobility: Sit to Supine       Sit to supine: Min assist;HOB elevated   General bed mobility comments: light assist for LEs back to bed    Transfers Overall transfer level: Needs assistance Equipment used: Rolling  walker (2 wheeled) Transfers: Sit to/from [facility] Sit to Stand: Min assist Stand pivot transfers: Min assist       General transfer comment: Pt with almost hyper awwareness of lines leads and actually tagling them in an attempt to manage them despite OT cueing to allow OT to manage lines/leads.    Balance Overall balance assessment: Needs assistance Sitting-balance support: Feet supported Sitting balance-Leahy Scale: Good       Standing balance-Leahy Scale: Poor Standing balance comment: reliant on UE support and fearful of faling d/t 3 recent falls.                           ADL either performed or assessed with clinical judgement   ADL                                         General ADL Comments: Pt requires MIN A with seated LB ADLs, SETUP with seated UB ADLs, MIN A with transfers/fxl mobility with RW.     Vision Patient Visual Report: No change from baseline       Perception     Praxis      Pertinent Vitals/Pain Pain Assessment: No/denies pain     Hand Dominance Left   Extremity/Trunk Assessment Upper Extremity Assessment Upper Extremity Assessment: Overall WFL for tasks assessed;Generalized weakness (ROM WFL, MMT grossly 4-/5)   Lower Extremity Assessment Lower Extremity Assessment: Defer to PT evaluation;Generalized weakness  Cervical / Trunk Assessment Cervical / Trunk Assessment: Normal   Communication Communication Communication: No difficulties   Cognition Arousal/Alertness: Awake/alert Behavior During Therapy: WFL for tasks assessed/performed Overall Cognitive Status: Within Functional Limits for tasks assessed                                     General Comments  pt on 3Lnc throughout session, spO2 monitor with difficulty maintaining pleth d/t pt's acrylic nails. Pt noted to have large bruise and healing gash over R eye which she reports is from her 2nd of 3 recent falls at  home.    Exercises Other Exercises Other Exercises: OT facilitates ed re: role of OT, safety with ADLs/ADL mobility, Importance of OOB activity, importance of posture with fxl mobility for safety and balance. Pt with good reception of education.   Shoulder Instructions      Home Living Family/patient expects to be discharged to:: Private residence Living Arrangements: Spouse/significant other Available Help at Discharge: Family;Available PRN/intermittently Type of Home: Apartment (condo) Home Access: Level entry     Home Layout: One level     Bathroom Shower/Tub: Teacher, early years/pre: Standard Bathroom Accessibility: Yes   Home Equipment: Environmental consultant - 2 wheels;Walker - 4 wheels;Grab bars - tub/shower;Cane - single point          Prior Functioning/Environment Level of Independence: Needs assistance  Gait / Transfers Assistance Needed: pt does not normally need physical assist to get out of bed/ambulation. Does use a walker ADL's / Homemaking Assistance Needed: pt assists with homemaking as able, husband drives. Pt does go shopping, reported modI/I for ADLs   Comments: 3 falls in the last 6 months, pt unsure of what causes them        OT Problem List: Decreased strength;Decreased activity tolerance;Decreased safety awareness;Decreased knowledge of use of DME or AE;Cardiopulmonary status limiting activity      OT Treatment/Interventions: Self-care/ADL training;Therapeutic exercise;DME and/or AE instruction;Therapeutic activities;Balance training;Energy conservation;Patient/family education    OT Goals(Current goals can be found in the care plan section) Acute Rehab OT Goals Patient Stated Goal: to go home OT Goal Formulation: With patient Time For Goal Achievement: 10/13/20 Potential to Achieve Goals: Good ADL Goals Pt Will Perform Lower Body Dressing: with supervision;sit to/from stand Pt Will Transfer to Toilet: with supervision;ambulating (wiht LRAD) Pt  Will Perform Toileting - Clothing Manipulation and hygiene: with supervision;sit to/from stand (with LRAD/BSC) Pt/caregiver will Perform Home Exercise Program: Increased strength;Both right and left upper extremity;With Supervision  OT Frequency: Min 1X/week   Barriers to D/C:            Co-evaluation              AM-PAC OT "6 Clicks" Daily Activity     Outcome Measure Help from another person eating meals?: None Help from another person taking care of personal grooming?: A Little Help from another person toileting, which includes using toliet, bedpan, or urinal?: A Little Help from another person bathing (including washing, rinsing, drying)?: A Little Help from another person to put on and taking off regular upper body clothing?: A Little Help from another person to put on and taking off regular lower body clothing?: A Little 6 Click Score: 19   End of Session Equipment Utilized During Treatment: Gait belt;Rolling walker;Oxygen Nurse Communication: Mobility status;Other (comment) (notified pt's spO2 monitor is not reading accurately d/t her acrylic nail whether she's  at rest or with activity.)  Activity Tolerance: Patient tolerated treatment well Patient left: in bed;with call bell/phone within reach;with bed alarm set  OT Visit Diagnosis: Unsteadiness on feet (R26.81);Muscle weakness (generalized) (M62.81)                Time: 8069-9967 OT Time Calculation (min): 79 min Charges:  OT General Charges $OT Visit: 1 Visit OT Evaluation $OT Eval Moderate Complexity: 1 Mod OT Treatments $Self Care/Home Management : 38-52 mins $Therapeutic Activity: 23-37 mins  Gerrianne Scale, MS, OTR/L ascom [phone] 09/29/20, 3:04 PM

## 2020-09-29 NOTE — Progress Notes (Signed)
PHARMACIST - PHYSICIAN COMMUNICATION  CONCERNING:  Enoxaparin (Lovenox) for DVT Prophylaxis    RECOMMENDATION: Patient was prescribed enoxaprin 30mg  q24 hours for VTE prophylaxis.   Filed Weights   09/27/20 1228  Weight: 68.9 kg (152 lb)    Body mass index is 27.8 kg/m.  Estimated Creatinine Clearance: 39.5 mL/min (by C-G formula based on SCr of 0.93 mg/dL).   Patient is candidate for enoxaparin 40mg  every 24 hours based on CrCl >33ml/min  DESCRIPTION: Pharmacy has adjusted enoxaparin dose per [facility] policy.  Patient is now receiving enoxaparin 40mg  every 24 hours   Lorna Dibble, [facility] 09/29/2020 9:59 AM

## 2020-09-30 LAB — CBC WITH DIFFERENTIAL/PLATELET
Abs Immature Granulocytes: 0.12 10*3/uL — ABNORMAL HIGH (ref 0.00–0.07)
Basophils Absolute: 0 10*3/uL (ref 0.0–0.1)
Basophils Relative: 0 %
Eosinophils Absolute: 0 10*3/uL (ref 0.0–0.5)
Eosinophils Relative: 0 %
HCT: 37 % (ref 36.0–46.0)
Hemoglobin: 11.9 g/dL — ABNORMAL LOW (ref 12.0–15.0)
Immature Granulocytes: 1 %
Lymphocytes Relative: 6 %
Lymphs Abs: 0.9 10*3/uL (ref 0.7–4.0)
MCH: 27.6 pg (ref 26.0–34.0)
MCHC: 32.2 g/dL (ref 30.0–36.0)
MCV: 85.8 fL (ref 80.0–100.0)
Monocytes Absolute: 0.4 10*3/uL (ref 0.1–1.0)
Monocytes Relative: 3 %
Neutro Abs: 14.6 10*3/uL — ABNORMAL HIGH (ref 1.7–7.7)
Neutrophils Relative %: 90 %
Platelets: 401 10*3/uL — ABNORMAL HIGH (ref 150–400)
RBC: 4.31 MIL/uL (ref 3.87–5.11)
RDW: 15.6 % — ABNORMAL HIGH (ref 11.5–15.5)
WBC: 16.1 10*3/uL — ABNORMAL HIGH (ref 4.0–10.5)
nRBC: 0 % (ref 0.0–0.2)

## 2020-09-30 LAB — FIBRIN DERIVATIVES D-DIMER (ARMC ONLY): Fibrin derivatives D-dimer (ARMC): 757.41 ng/mL (FEU) — ABNORMAL HIGH (ref 0.00–499.00)

## 2020-09-30 LAB — COMPREHENSIVE METABOLIC PANEL
ALT: 8 U/L (ref 0–44)
AST: 14 U/L — ABNORMAL LOW (ref 15–41)
Albumin: 3.3 g/dL — ABNORMAL LOW (ref 3.5–5.0)
Alkaline Phosphatase: 52 U/L (ref 38–126)
Anion gap: 10 (ref 5–15)
BUN: 42 mg/dL — ABNORMAL HIGH (ref 8–23)
CO2: 28 mmol/L (ref 22–32)
Calcium: 9.3 mg/dL (ref 8.9–10.3)
Chloride: 99 mmol/L (ref 98–111)
Creatinine, Ser: 1.07 mg/dL — ABNORMAL HIGH (ref 0.44–1.00)
GFR, Estimated: 51 mL/min — ABNORMAL LOW (ref >60)
Glucose, Bld: 234 mg/dL — ABNORMAL HIGH (ref 70–99)
Potassium: 4.8 mmol/L (ref 3.5–5.1)
Sodium: 137 mmol/L (ref 135–145)
Total Bilirubin: 0.4 mg/dL (ref 0.3–1.2)
Total Protein: 7.4 g/dL (ref 6.5–8.1)

## 2020-09-30 LAB — GLUCOSE, CAPILLARY
Glucose-Capillary: 261 mg/dL — ABNORMAL HIGH (ref 70–99)
Glucose-Capillary: 341 mg/dL — ABNORMAL HIGH (ref 70–99)

## 2020-09-30 LAB — C-REACTIVE PROTEIN: CRP: <0.5 mg/dL (ref <1.0)

## 2020-09-30 MED ORDER — PREDNISONE 10 MG PO TABS: ORAL_TABLET | ORAL | 0 refills | Status: AC

## 2020-09-30 MED ORDER — HYDROCOD POLST-CPM POLST ER 10-8 MG/5ML PO SUER: 5.0000 mL | Freq: Every evening | ORAL | 0 refills | Status: DC | PRN

## 2020-09-30 MED ORDER — IPRATROPIUM BROMIDE HFA 17 MCG/ACT IN AERS
2.0000 | INHALATION_SPRAY | RESPIRATORY_TRACT | Status: DC
  Filled 2020-09-30: qty 12.9

## 2020-09-30 MED ORDER — AMMONIUM LACTATE 12 % EX LOTN: 1.0000 "application " | TOPICAL_LOTION | CUTANEOUS | 0 refills | Status: DC | PRN

## 2020-09-30 MED ORDER — GUAIFENESIN ER 600 MG PO TB12: 600.0000 mg | ORAL_TABLET | Freq: Two times a day (BID) | ORAL | 0 refills | Status: AC

## 2020-09-30 MED ORDER — ALBUTEROL SULFATE HFA 108 (90 BASE) MCG/ACT IN AERS: 2.0000 | INHALATION_SPRAY | Freq: Four times a day (QID) | RESPIRATORY_TRACT | 0 refills | Status: DC | PRN

## 2020-09-30 MED ORDER — GUAIFENESIN-DM 100-10 MG/5ML PO SYRP: 10.0000 mL | ORAL_SOLUTION | ORAL | 0 refills | Status: DC | PRN

## 2020-09-30 NOTE — TOC Transition Note (Signed)
Transition of Care [facility]) - CM/SW Discharge Note   Patient Details  Name: CORALEE EDBERG MRN: [id number] Date of Birth: [birth year]  [facility]) CM/SW Contact:  Truitt Merle, LCSW Phone Number: 09/30/2020, 5:07 PM   Clinical Narrative:    Notified of O2 need. Confirmed with Jermaine at [facility] for O2 delivery today. Updated RN Laverle Patter and Dr. Arbutus Ped. Patient to discharge home today.      Barriers to Discharge: Continued Medical Work up   Patient Goals and CMS Choice Patient states their goals for this hospitalization and ongoing recovery are:: Would like to go home and agrees to home health services CMS Medicare.gov Compare Post Acute Care list provided to:: Patient Choice offered to / list presented to : Patient  Discharge Placement                       Discharge Plan and Services   Discharge Planning Services: CM Consult Post Acute Care Choice: [place]: RN,PT,OT [place] Agency: [place] ([facility]) Date HH Agency Contacted: 09/29/20 Time [place]: [id number] Representative spoke with at [place]: [place] ([place]) Interventions     Readmission Risk Interventions No flowsheet data found.

## 2020-09-30 NOTE — Progress Notes (Signed)
Pt Sp02 92% on room air at rest Pt Sp02 80% while ambulating on room air Pt Sp02 95% and above on 2L [place] while ambulating

## 2020-09-30 NOTE — Discharge Summary (Signed)
Physician Discharge Summary  Cassandra [name] XBJ:[id number] DOB: 1933/06/02 DOA: 09/28/2020  PCP: Idelle Crouch, MD  Admit date: 09/28/2020 Discharge date: 10/06/2020  Admitted From: home Disposition:  home  Recommendations for Outpatient Follow-up:  1. Follow up with PCP in 1-2 weeks 2. Please obtain BMP/CBC in one week 3. Please follow up on patient's requirement for oxygen as she recovers from Covid.     Home Health: PT, OT, RN  Equipment/Devices: Oxygen, 3-n-1, shower seat    Discharge Condition: Stable  CODE STATUS: Full  Diet recommendation: Carb Modified    Discharge Diagnoses: Active Problems:   Acute hypoxemic respiratory failure due to COVID-19 [facility])    Summary of HPI and Hospital Course:  87 y.o. female with a history of hypothyroidism, hypertension, coronary artery disease, depression, type 2 diabetes mellitus and dyslipidemia, who presented to the ED on 09/28/20 with worsening dyspnea, nonproductive cough, diarrhea as well as vertigo and presyncope that led to a fall and right periorbital contusion.    Patient's spO2 was in upper 80's on room air, improved to mid 90's on 2 L/min oxygen.  Covid-19 PCR was positive.  Chest xray negative for infiltrates, but showed emphysematous changes and fibrosis, and shallow inspirations with bibasilar atelectasis.  Admitted to hospitalist service.  Started on remdesivir and IV steroids.    Acute respiratory failure with hypoxia secondary to COVID-19 infection -patient presented with dyspnea, hypoxia and confirmed Covid positive.  Requiring 2 L/min nasal cannula oxygen. Chest x-ray did not show any clear infiltrates however bibasilar atelectasis can mask infiltrate.  She also has emphysematous changes on her x-ray and likely has underlying COPD with some exacerbation. Treated with remdesivir and steroids  Supportive care with antitussives, bronchodilators, vitamins, Pepcid, aspirin Inflammatory markers were monitored and  improved O2 to maintain sats 88 to 93%, wean as tolerated  Presyncope -leading to fall and right periorbital contusion.  Suspect this is due to relative dehydration and hypoxia in the setting of Covid infection.  No events seen on telemetry.  Recurrent falls -patient reportedly had 3 falls in the past 6 months.  PT and OT recommended home health which was arranged.  Orthostatic vitals were negative.  Patient has history of vertigo which may contribute to falls.  Consider referral to vestibular therapy. Steroid-induced hyperglycemia Type 2 diabetes -uncontrolled, A1c this admission is 8.2%. Covered with sliding scale NovoLog and basal insulin.   Sick sinus syndrome -status post pacemaker.  No acute issues. Pacemaker was interrogated a data sent to [employer] in the ED. Coronary artery disease -stable, patient without any chest pain.  Continue metoprolol and aspirin. Hyperlipidemia -continue statin Essential hypertension -continue home regimen with Maxzide and Toprol-XL Hypothyroidism -continue Synthroid.  TSH was checked on admission and normal. Depression/anxiety -continue home Valium, Paxil and Seroquel   Discharge Instructions   Discharge Instructions    Call MD for:   Complete by: As directed    Worsening shortness of breath that does not improve with rest. Having to turn up oxygen above 3 L/min if unable to turn it back down with in 30 minutes of resting.   Call MD for:  extreme fatigue   Complete by: As directed    Call MD for:  persistant dizziness or light-headedness   Complete by: As directed    Call MD for:  persistant nausea and vomiting   Complete by: As directed    Call MD for:  severe uncontrolled pain   Complete by: As directed    Call  MD for:  temperature >100.4   Complete by: As directed    Diet - low sodium heart healthy   Complete by: As directed    Increase activity slowly   Complete by: As directed      Allergies as of 09/30/2020   No Known Allergies      Medication List    STOP taking these medications   carbidopa-levodopa 25-100 MG tablet Commonly known as: SINEMET IR   pramipexole 0.5 MG tablet Commonly known as: MIRAPEX     TAKE these medications   albuterol 108 (90 Base) MCG/ACT inhaler Commonly known as: VENTOLIN HFA Inhale 2 puffs into the lungs every 6 (six) hours as needed for wheezing or shortness of breath.   amitriptyline 10 MG tablet Commonly known as: ELAVIL Take 10 mg by mouth at bedtime.   ammonium lactate 12 % lotion Commonly known as: AmLactin Apply 1 application topically as needed for dry skin.   aspirin EC 81 MG tablet Take 81 mg by mouth daily.   chlorpheniramine-HYDROcodone 10-8 MG/5ML Suer Commonly known as: TUSSIONEX Take 5 mLs by mouth at bedtime as needed for cough.   cholecalciferol 25 MCG (1000 UNIT) tablet Commonly known as: VITAMIN D Take 1,000 Units by mouth daily.   clopidogrel 75 MG tablet Commonly known as: PLAVIX TAKE 1 TABLET BY MOUTH DAILY   diazepam 2 MG tablet Commonly known as: VALIUM Take 2 mg by mouth 2 (two) times daily.   guaiFENesin 600 MG 12 hr tablet Commonly known as: MUCINEX Take 1 tablet (600 mg total) by mouth 2 (two) times daily for 10 days.   guaiFENesin-dextromethorphan 100-10 MG/5ML syrup Commonly known as: ROBITUSSIN DM Take 10 mLs by mouth every 4 (four) hours as needed for cough.   HumaLOG KwikPen 100 UNIT/ML KwikPen Generic drug: insulin lispro Inject 10 Units into the skin 3 (three) times daily. *Use as directed per sliding scale*   insulin glargine 100 UNIT/ML injection Commonly known as: LANTUS Inject 0.5 mLs (50 Units total) into the skin at bedtime. What changed: how much to take   levothyroxine 150 MCG tablet Commonly known as: SYNTHROID Take 150 mcg by mouth daily before breakfast. What changed: Another medication with the same name was removed. Continue taking this medication, and follow the directions you see here.   magnesium oxide  400 MG tablet Commonly known as: MAG-OX Take 400 mg by mouth 2 (two) times daily.   metFORMIN 500 MG 24 hr tablet Commonly known as: GLUCOPHAGE-XR Take 2 tablets (1,000 mg total) by mouth 2 (two) times daily. Please start this on 8/24   metoprolol succinate 25 MG 24 hr tablet Commonly known as: TOPROL-XL Take 1 tablet (25 mg total) by mouth daily.   multivitamin with minerals Tabs tablet Take 1 tablet by mouth daily.   nitroGLYCERIN 0.4 MG SL tablet Commonly known as: NITROSTAT Place 1 tablet (0.4 mg total) under the tongue every 5 (five) minutes as needed for chest pain.   pantoprazole 40 MG tablet Commonly known as: PROTONIX Take 40 mg by mouth daily.   PARoxetine 10 MG tablet Commonly known as: PAXIL Take 10 mg by mouth daily.   pioglitazone 30 MG tablet Commonly known as: ACTOS Take by mouth daily.   predniSONE 10 MG tablet Commonly known as: DELTASONE Take 4 tablets (40 mg total) by mouth daily with breakfast for 2 days, THEN 3 tablets (30 mg total) daily with breakfast for 2 days, THEN 2 tablets (20 mg total) daily with breakfast  for 2 days, THEN 1 tablet (10 mg total) daily with breakfast for 2 days. Start taking on: September 30, 2020   QUEtiapine 25 MG tablet Commonly known as: SEROQUEL Take 25 mg by mouth at bedtime.   rosuvastatin 10 MG tablet Commonly known as: CRESTOR Take 2 tablets (20 mg total) by mouth at bedtime. What changed: how much to take   solifenacin 10 MG tablet Commonly known as: VESICARE Take by mouth.   triamterene-hydrochlorothiazide 37.5-25 MG capsule Commonly known as: DYAZIDE Take 1 capsule by mouth daily.       No Known Allergies   If you experience worsening of your admission symptoms, develop shortness of breath, life threatening emergency, suicidal or homicidal thoughts you must seek medical attention immediately by calling 911 or calling your MD immediately  if symptoms less severe.    Please note   You were cared for  by a hospitalist during your hospital stay. If you have any questions about your discharge medications or the care you received while you were in the hospital after you are discharged, you can call the unit and asked to speak with the hospitalist on call if the hospitalist that took care of you is not available. Once you are discharged, your primary care physician will handle any further medical issues. Please note that NO REFILLS for any discharge medications will be authorized once you are discharged, as it is imperative that you return to your primary care physician (or establish a relationship with a primary care physician if you do not have one) for your aftercare needs so that they can reassess your need for medications and monitor your lab values.   Consultations:  none   Procedures/Studies: DG Chest 2 View  Result Date: 09/28/2020 CLINICAL DATA:  Shortness of breath EXAM: CHEST - 2 VIEW COMPARISON:  07/21/2020 FINDINGS: Cardiac pacemaker. Shallow inspiration with atelectasis in the lung bases. Mild cardiac enlargement. Emphysematous changes and fibrosis in the lungs. No definite consolidation or effusion. Degenerative changes in the spine. Lower thoracic kyphoplasty. IMPRESSION: Shallow inspiration with atelectasis in the lung bases. Emphysematous changes and fibrosis. Electronically Signed   By: Lucienne Capers M.D.   On: 09/28/2020 01:28   CT HEAD WO CONTRAST  Result Date: 09/27/2020 CLINICAL DATA:  Fall.  RIGHT eye laceration.  Dizziness EXAM: CT HEAD WITHOUT CONTRAST CT MAXILLOFACIAL WITHOUT CONTRAST CT CERVICAL SPINE WITHOUT CONTRAST TECHNIQUE: Multidetector CT imaging of the head, cervical spine, and maxillofacial structures were performed using the standard protocol without intravenous contrast. Multiplanar CT image reconstructions of the cervical spine and maxillofacial structures were also generated. COMPARISON:  09/06/2020 FINDINGS: CT HEAD FINDINGS Brain: No acute intracranial  hemorrhage. No focal mass lesion. No CT evidence of acute infarction. No midline shift or mass effect. No hydrocephalus. Basilar cisterns are patent. There are periventricular and subcortical white matter hypodensities. Generalized cortical atrophy. Vascular: No hyperdense vessel or unexpected calcification. Skull: Normal. Negative for fracture or focal lesion. Sinuses/Orbits: Paranasal sinuses and mastoid air cells are clear. Orbits are clear. Other: None. CT MAXILLOFACIAL FINDINGS Osseous: Orbital rims are intact. Zygomatic arches are intact. No fracture of the maxillary sinus walls. Acute nasal bone fracture. Pterygoid plates are normal. Mandibular condyles are located. No mandibular fracture is. Orbits: Globes are normal.  Intraconal contents are normal. Sinuses: Interval clearing of hematoma from the ethmoid air cells and posterior nasopharynx from comparison CT. No evidence fluid in the paranasal sinuses. Soft tissues: Soft tissue thickening over the RIGHT orbit. No fracture. CT CERVICAL  SPINE FINDINGS Alignment: Normal alignment of the cervical vertebral bodies. Skull base and vertebrae: Normal craniocervical junction. No loss of vertebral body height or disc height. Normal facet articulation. No evidence of fracture. Soft tissues and spinal canal: No prevertebral soft tissue swelling. No perispinal or epidural hematoma. Disc levels:  Unremarkable Upper chest: Centrilobular emphysema the upper lobes. Pleuroparenchymal thickening at the LEFT upper lobe appears benign. Other: None IMPRESSION: 1. No acute intracranial findings. 2. Soft tissue injury over the RIGHT orbit. No orbital fracture. No injury to the orbit. 3. No cervical spine fracture. 4. Scarring and pleural thickening at the lung apices. Electronically Signed   By: Suzy Bouchard M.D.   On: 09/27/2020 13:38   CT CERVICAL SPINE WO CONTRAST  Result Date: 09/27/2020 CLINICAL DATA:  Fall.  RIGHT eye laceration.  Dizziness EXAM: CT HEAD WITHOUT  CONTRAST CT MAXILLOFACIAL WITHOUT CONTRAST CT CERVICAL SPINE WITHOUT CONTRAST TECHNIQUE: Multidetector CT imaging of the head, cervical spine, and maxillofacial structures were performed using the standard protocol without intravenous contrast. Multiplanar CT image reconstructions of the cervical spine and maxillofacial structures were also generated. COMPARISON:  09/06/2020 FINDINGS: CT HEAD FINDINGS Brain: No acute intracranial hemorrhage. No focal mass lesion. No CT evidence of acute infarction. No midline shift or mass effect. No hydrocephalus. Basilar cisterns are patent. There are periventricular and subcortical white matter hypodensities. Generalized cortical atrophy. Vascular: No hyperdense vessel or unexpected calcification. Skull: Normal. Negative for fracture or focal lesion. Sinuses/Orbits: Paranasal sinuses and mastoid air cells are clear. Orbits are clear. Other: None. CT MAXILLOFACIAL FINDINGS Osseous: Orbital rims are intact. Zygomatic arches are intact. No fracture of the maxillary sinus walls. Acute nasal bone fracture. Pterygoid plates are normal. Mandibular condyles are located. No mandibular fracture is. Orbits: Globes are normal.  Intraconal contents are normal. Sinuses: Interval clearing of hematoma from the ethmoid air cells and posterior nasopharynx from comparison CT. No evidence fluid in the paranasal sinuses. Soft tissues: Soft tissue thickening over the RIGHT orbit. No fracture. CT CERVICAL SPINE FINDINGS Alignment: Normal alignment of the cervical vertebral bodies. Skull base and vertebrae: Normal craniocervical junction. No loss of vertebral body height or disc height. Normal facet articulation. No evidence of fracture. Soft tissues and spinal canal: No prevertebral soft tissue swelling. No perispinal or epidural hematoma. Disc levels:  Unremarkable Upper chest: Centrilobular emphysema the upper lobes. Pleuroparenchymal thickening at the LEFT upper lobe appears benign. Other: None  IMPRESSION: 1. No acute intracranial findings. 2. Soft tissue injury over the RIGHT orbit. No orbital fracture. No injury to the orbit. 3. No cervical spine fracture. 4. Scarring and pleural thickening at the lung apices. Electronically Signed   By: Suzy Bouchard M.D.   On: 09/27/2020 13:38   CT MAXILLOFACIAL WO CONTRAST  Result Date: 09/27/2020 CLINICAL DATA:  Fall.  RIGHT eye laceration.  Dizziness EXAM: CT HEAD WITHOUT CONTRAST CT MAXILLOFACIAL WITHOUT CONTRAST CT CERVICAL SPINE WITHOUT CONTRAST TECHNIQUE: Multidetector CT imaging of the head, cervical spine, and maxillofacial structures were performed using the standard protocol without intravenous contrast. Multiplanar CT image reconstructions of the cervical spine and maxillofacial structures were also generated. COMPARISON:  09/06/2020 FINDINGS: CT HEAD FINDINGS Brain: No acute intracranial hemorrhage. No focal mass lesion. No CT evidence of acute infarction. No midline shift or mass effect. No hydrocephalus. Basilar cisterns are patent. There are periventricular and subcortical white matter hypodensities. Generalized cortical atrophy. Vascular: No hyperdense vessel or unexpected calcification. Skull: Normal. Negative for fracture or focal lesion. Sinuses/Orbits: Paranasal sinuses and mastoid  air cells are clear. Orbits are clear. Other: None. CT MAXILLOFACIAL FINDINGS Osseous: Orbital rims are intact. Zygomatic arches are intact. No fracture of the maxillary sinus walls. Acute nasal bone fracture. Pterygoid plates are normal. Mandibular condyles are located. No mandibular fracture is. Orbits: Globes are normal.  Intraconal contents are normal. Sinuses: Interval clearing of hematoma from the ethmoid air cells and posterior nasopharynx from comparison CT. No evidence fluid in the paranasal sinuses. Soft tissues: Soft tissue thickening over the RIGHT orbit. No fracture. CT CERVICAL SPINE FINDINGS Alignment: Normal alignment of the cervical vertebral  bodies. Skull base and vertebrae: Normal craniocervical junction. No loss of vertebral body height or disc height. Normal facet articulation. No evidence of fracture. Soft tissues and spinal canal: No prevertebral soft tissue swelling. No perispinal or epidural hematoma. Disc levels:  Unremarkable Upper chest: Centrilobular emphysema the upper lobes. Pleuroparenchymal thickening at the LEFT upper lobe appears benign. Other: None IMPRESSION: 1. No acute intracranial findings. 2. Soft tissue injury over the RIGHT orbit. No orbital fracture. No injury to the orbit. 3. No cervical spine fracture. 4. Scarring and pleural thickening at the lung apices. Electronically Signed   By: Suzy Bouchard M.D.   On: 09/27/2020 13:38       Subjective: Pt reports feeling well this AM.  She does continue to feel a bit weak.  No significant SOB when up to bathroom.  No other acute complaints.   Discharge Exam: Vitals:   09/30/20 0822 09/30/20 1300  BP: (!) 153/50 (!) 145/50  Pulse: (!) 58 64  Resp: 16   Temp: 98.4 F (36.9 C) 98 F (36.7 C)  SpO2: 98% 98%   Vitals:   09/30/20 0131 09/30/20 0555 09/30/20 0822 09/30/20 1300  BP: (!) 151/76 (!) 161/67 (!) 153/50 (!) 145/50  Pulse: 63 60 (!) 58 64  Resp: 16 16 16    Temp: 97.6 F (36.4 C) (!) 97.4 F (36.3 C) 98.4 F (36.9 C) 98 F (36.7 C)  TempSrc: Oral Oral    SpO2: 97% 98% 98% 98%  Weight:      Height:        General: Pt is alert, awake, not in acute distress Cardiovascular: RRR, S1/S2 +, no rubs, no gallops Respiratory: decreased breaths sounds, no wheezing, no rhonchi Abdominal: Soft, NT, ND, bowel sounds + Extremities: no edema, no cyanosis    The results of significant diagnostics from this hospitalization (including imaging, microbiology, ancillary and laboratory) are listed below for reference.     Microbiology: Recent Results (from the past 240 hour(s))  SARS Coronavirus 2 by RT PCR (hospital order, performed in [facility]  hospital lab) Nasopharyngeal Nasopharyngeal Swab     Status: Abnormal   Collection Time: 09/28/20 12:57 AM   Specimen: Nasopharyngeal Swab  Result Value Ref Range Status   SARS Coronavirus 2 POSITIVE (A) NEGATIVE Final    Comment: RESULT CALLED TO, READ BACK BY AND VERIFIED WITH: Melanie Crazier @0222  09/28/20 RH (NOTE) SARS-CoV-2 target nucleic acids are DETECTED  SARS-CoV-2 RNA is generally detectable in upper respiratory specimens  during the acute phase of infection.  Positive results are indicative  of the presence of the identified virus, but do not rule out bacterial infection or co-infection with other pathogens not detected by the test.  Clinical correlation with patient history and  other diagnostic information is necessary to determine patient infection status.  The expected result is negative.  Fact Sheet for Patients:   StrictlyIdeas.no   Fact Sheet for Healthcare  Providers:   BankingDealers.co.za    This test is not yet approved or cleared by the [place] and  has been authorized for detection and/or diagnosis of SARS-CoV-2 by FDA under an Emergency Use Authorization (EUA).  This EUA will remain in effect (meaning this test  can be used) for the duration of  the COVID-19 declaration under Section 564(b)(1) of the Act, 21 U.S.C. section 360-bbb-3(b)(1), unless the authorization is terminated or revoked sooner.  Performed at [facility], [address] [place], [place] [address]      Labs: BNP (last 3 results) Recent Labs    07/21/20 1659  BNP 824.2*   Basic Metabolic Panel: Recent Labs  Lab 09/30/20 0518  NA 137  K 4.8  CL 99  CO2 28  GLUCOSE 234*  BUN 42*  CREATININE 1.07*  CALCIUM 9.3   Liver Function Tests: Recent Labs  Lab 09/30/20 0518  AST 14*  ALT 8  ALKPHOS 52  BILITOT 0.4  PROT 7.4  ALBUMIN 3.3*   No results for input(s): LIPASE, AMYLASE in the last 168  hours. No results for input(s): AMMONIA in the last 168 hours. CBC: Recent Labs  Lab 09/30/20 0518  WBC 16.1*  NEUTROABS 14.6*  HGB 11.9*  HCT 37.0  MCV 85.8  PLT 401*   Cardiac Enzymes: No results for input(s): CKTOTAL, CKMB, CKMBINDEX, TROPONINI in the last 168 hours. BNP: Invalid input(s): POCBNP CBG: Recent Labs  Lab 09/29/20 1647 09/29/20 2052 09/30/20 0819 09/30/20 1232  GLUCAP 257* 207* 261* 341*   D-Dimer No results for input(s): DDIMER in the last 72 hours. Hgb A1c No results for input(s): HGBA1C in the last 72 hours. Lipid Profile No results for input(s): CHOL, HDL, LDLCALC, TRIG, CHOLHDL, LDLDIRECT in the last 72 hours. Thyroid function studies No results for input(s): TSH, T4TOTAL, T3FREE, THYROIDAB in the last 72 hours.  Invalid input(s): FREET3 Anemia work up No results for input(s): VITAMINB12, FOLATE, FERRITIN, TIBC, IRON, RETICCTPCT in the last 72 hours. Urinalysis    Component Value Date/Time   COLORURINE STRAW (A) 09/28/2020 0530   APPEARANCEUR CLEAR (A) 09/28/2020 0530   APPEARANCEUR Clear 10/27/2017 1508   LABSPEC 1.013 09/28/2020 0530   LABSPEC 1.008 05/11/2012 1400   PHURINE 5.0 09/28/2020 0530   GLUCOSEU 50 (A) 09/28/2020 0530   GLUCOSEU Negative 05/11/2012 1400   HGBUR NEGATIVE 09/28/2020 0530   BILIRUBINUR NEGATIVE 09/28/2020 0530   BILIRUBINUR Negative 10/27/2017 1508   BILIRUBINUR Negative 05/11/2012 1400   KETONESUR NEGATIVE 09/28/2020 0530   PROTEINUR NEGATIVE 09/28/2020 0530   NITRITE NEGATIVE 09/28/2020 0530   LEUKOCYTESUR NEGATIVE 09/28/2020 0530   LEUKOCYTESUR Trace 05/11/2012 1400   Sepsis Labs Invalid input(s): PROCALCITONIN,  WBC,  LACTICIDVEN Microbiology Recent Results (from the past 240 hour(s))  SARS Coronavirus 2 by RT PCR (hospital order, performed in [place] hospital lab) Nasopharyngeal Nasopharyngeal Swab     Status: Abnormal   Collection Time: 09/28/20 12:57 AM   Specimen: Nasopharyngeal Swab  Result  Value Ref Range Status   SARS Coronavirus 2 POSITIVE (A) NEGATIVE Final    Comment: RESULT CALLED TO, READ BACK BY AND VERIFIED WITH: Melanie Crazier @0222  09/28/20 RH (NOTE) SARS-CoV-2 target nucleic acids are DETECTED  SARS-CoV-2 RNA is generally detectable in upper respiratory specimens  during the acute phase of infection.  Positive results are indicative  of the presence of the identified virus, but do not rule out bacterial infection or co-infection with other pathogens not detected by the test.  Clinical correlation with patient history and  other diagnostic information is necessary to determine patient infection status.  The expected result is negative.  Fact Sheet for Patients:   StrictlyIdeas.no   Fact Sheet for Healthcare Providers:   BankingDealers.co.za    This test is not yet approved or cleared by the [place] FDA and  has been authorized for detection and/or diagnosis of SARS-CoV-2 by FDA under an Emergency Use Authorization (EUA).  This EUA will remain in effect (meaning this test  can be used) for the duration of  the COVID-19 declaration under Section 564(b)(1) of the Act, 21 U.S.C. section 360-bbb-3(b)(1), unless the authorization is terminated or revoked sooner.  Performed at [facility], [address] [place], [place] [address]      Time coordinating discharge: Over 30 minutes  SIGNED:   Ezekiel Slocumb, DO [facility] 10/06/2020, 3:53 PM   If 7PM-7AM, please contact night-coverage [email or web address]

## 2020-11-19 IMAGING — RF DG ESOPHAGUS
8 series · 14 of 18 positions shown · non-contrast
Comparison: Chest x-ray 10/04/2018.  Upper GI 02/02/2018.

CLINICAL DATA: Dysphagia.  History of esophageal stricture.

EXAM:
ESOPHOGRAM/BARIUM SWALLOW
TECHNIQUE: Single contrast examination was performed using thin barium or water
soluble.
FLUOROSCOPY TIME:  Fluoroscopy Time:  1 minutes 54 seconds
Radiation Exposure Index (if provided by the fluoroscopic device):
31.0 mGy

[Series 1: fluoro_barium 2fps_bw · 0.18mm/px · 3 of 15 frames shown (1 of 7)]
[frame 3/15]
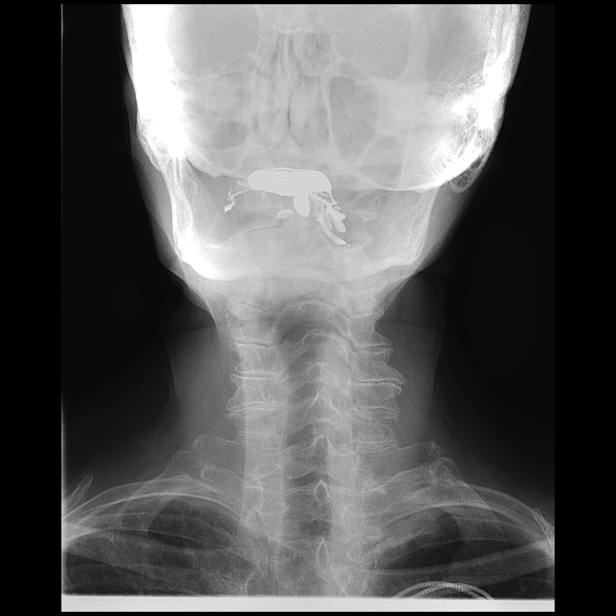
[frame 6/15]
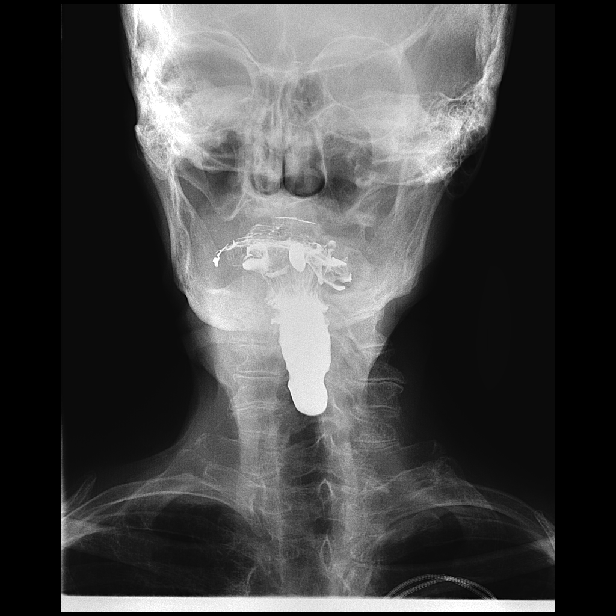
[frame 13/15]
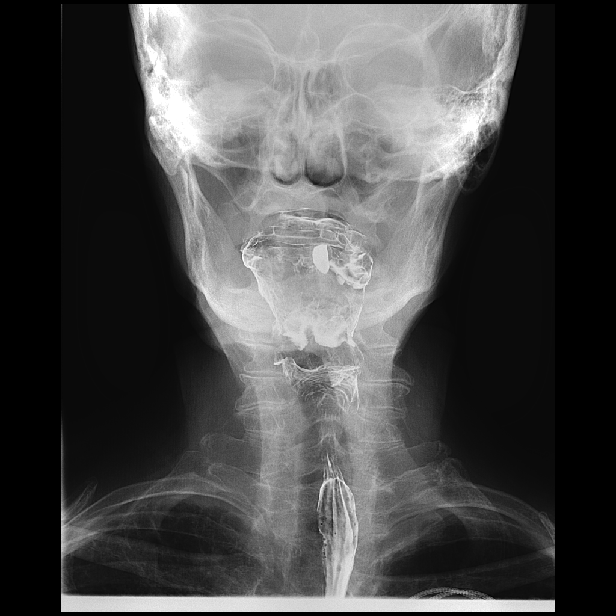

[Series 2: fluoro_barium 2fps_bw · 0.18mm/px · 2 of 8 frames shown (2 of 7)]
[frame 2/8]
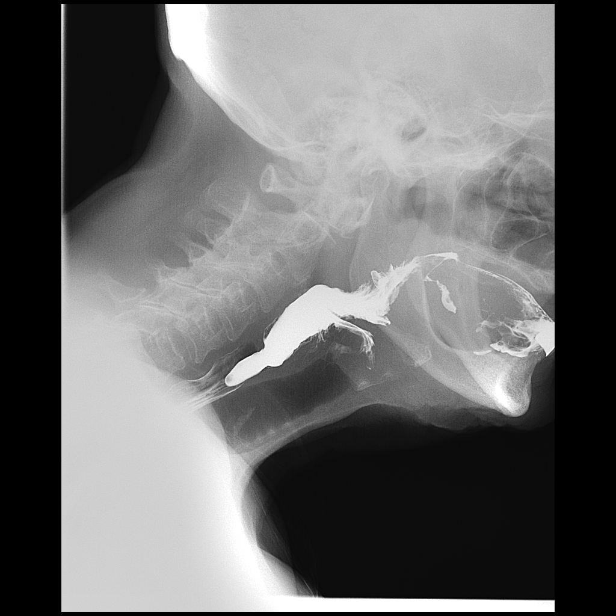
[frame 5/8]
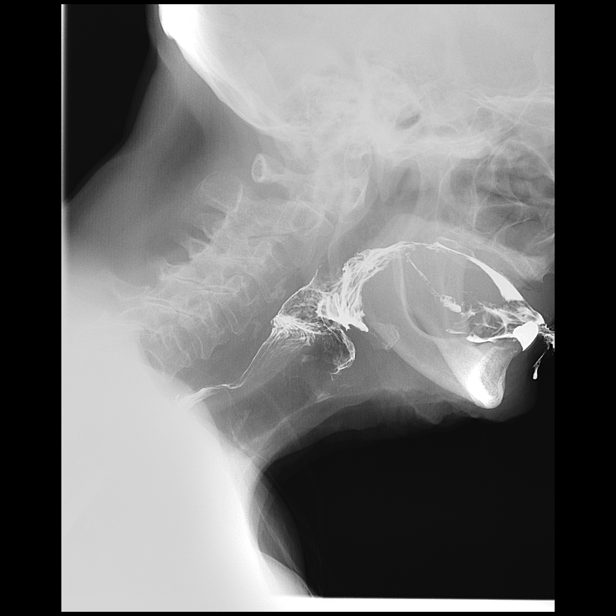

[Series 3: fluoro_barium 2fps_bw · 0.18mm/px · 1 of 1 slices shown (3 of 7)]
[im 1/1]
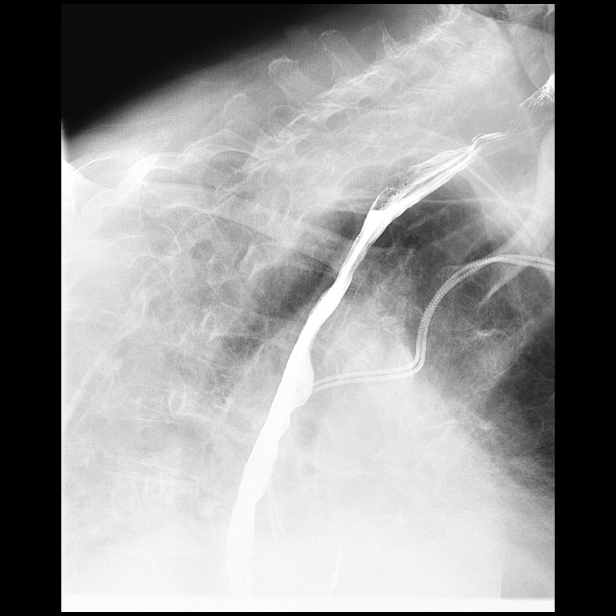

[Series 4: fluoro_barium 2fps_bw · 0.18mm/px · 2 of 2 frames shown (4 of 7)]
[frame 1/2]
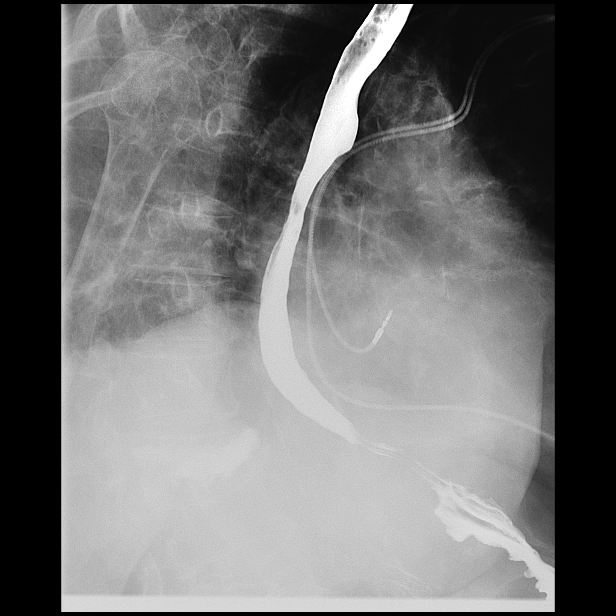
[frame 2/2]
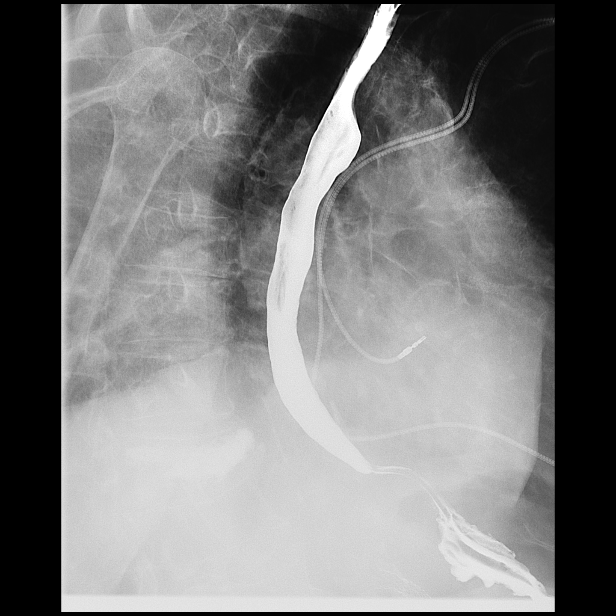

[Series 5: fluoro_barium 2fps_bw · 0.18mm/px · 1 of 2 frames shown (5 of 7)]
[frame 1/2]
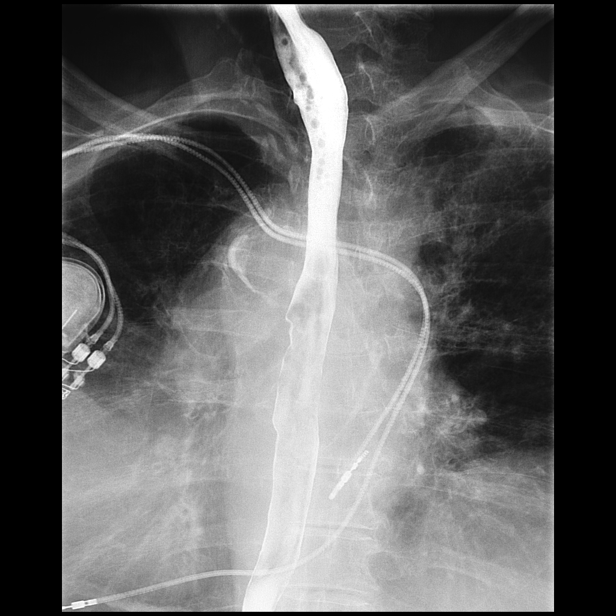

[Series 6: fluoro_barium 2fps_bw · 0.18mm/px · 1 of 1 slices shown (6 of 7)]
[im 1/1]
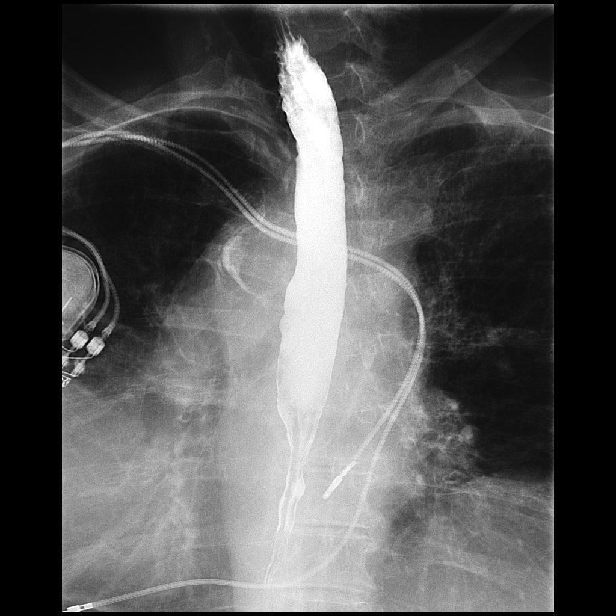

[Series 7: fluoro_barium 2fps_bw · 0.18mm/px · 1 of 1 slices shown (7 of 7)]
[im 1/1]
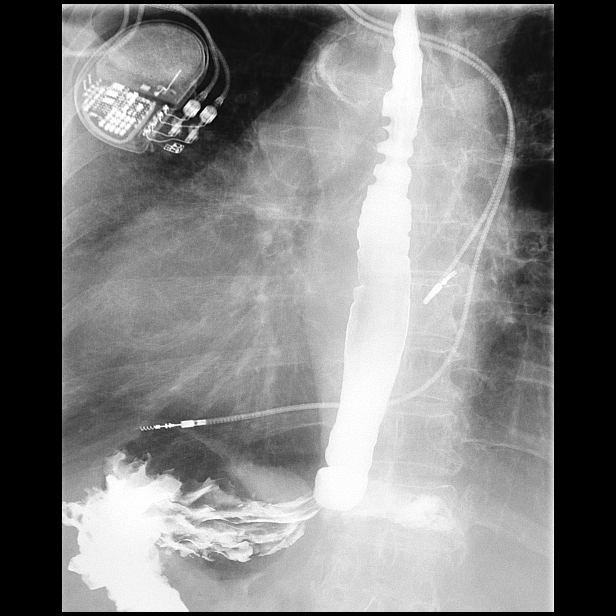

[Series 8: cp_standard · 0.17mm/px · 3 of 235 frames shown]
[frame 36/235]
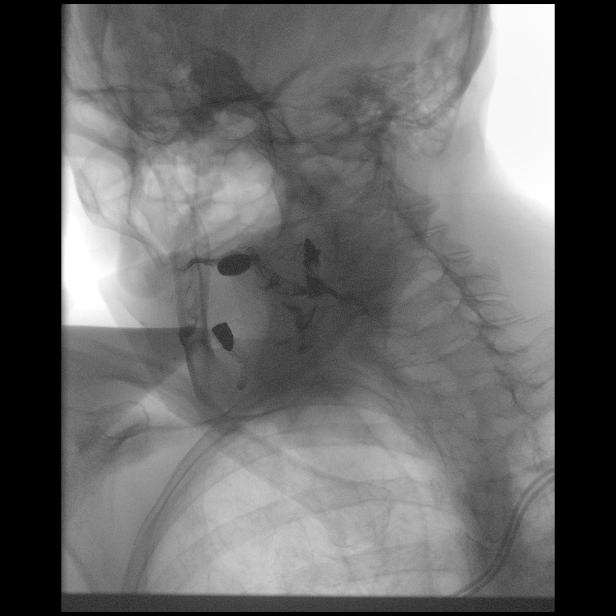
[frame 118/235]
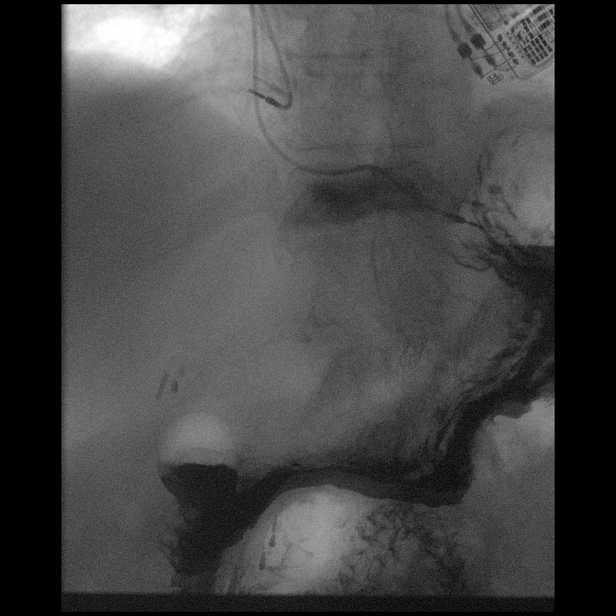
[frame 200/235]
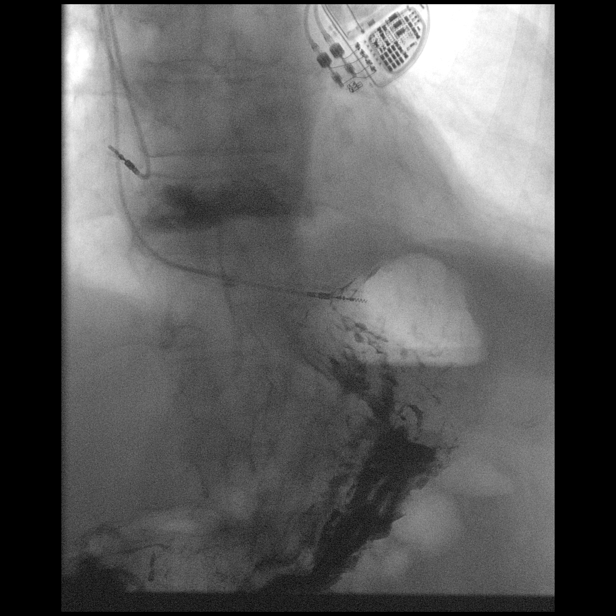

[14 of 18 positions shown; findings below may reference images not displayed]

FINDINGS: Limited exam due to the patient's age. Cardiac pacer noted.
Cardiomegaly noted. Mild prominence of the cricopharyngeus fold
noted. Cervical esophagus is otherwise unremarkable. No evidence of
aspiration. Thoracic esophagus is widely patent. No significant
stricture noted. No reflux noted. Standard barium tablet passed
normally.
IMPRESSION: Mild prominence of the cricopharyngeus fold noted. Cervical
esophagus is otherwise unremarkable. No aspiration. Thoracic
esophagus is widely patent. No significant stricture. No reflux.

## 2020-12-06 ENCOUNTER — Other Ambulatory Visit: Payer: Self-pay | Admitting: Gastroenterology

## 2020-12-06 DIAGNOSIS — R1314 Dysphagia, pharyngoesophageal phase: Secondary | ICD-10-CM

## 2020-12-18 ENCOUNTER — Other Ambulatory Visit: Payer: Self-pay

## 2020-12-18 ENCOUNTER — Ambulatory Visit
Admission: RE | Admit: 2020-12-18 | Discharge: 2020-12-18 | Disposition: A | Discharge status: Home / Self Care | Payer: Medicare Other | Source: Ambulatory Visit | Attending: Gastroenterology | Admitting: Gastroenterology

## 2020-12-18 DIAGNOSIS — R1314 Dysphagia, pharyngoesophageal phase: Secondary | ICD-10-CM

## 2020-12-18 NOTE — Therapy (Signed)
[place], [place], [address] Phone: [phone]   Fax:     Modified Barium Swallow  Patient Details  Name: Cassandra [name] MRN: [id number] Date of Birth: 1932-10-22 No data recorded  Encounter Date: 12/18/2020   End of Session - 12/18/20 1639    Visit Number 1    Number of Visits 1    Date for SLP Re-Evaluation 12/18/20    SLP Start Time 4401    SLP Stop Time  1303    SLP Time Calculation (min) 23 min    Activity Tolerance Patient tolerated treatment well           There were no vitals filed for this visit.   Subjective Assessment - 12/18/20 1628    Subjective "I get strangled." (when she drinks liquids)    Currently in Pain? No/denies          Objective Swallowing Evaluation: Type of Study: MBS-Modified Barium Swallow Study   Patient Details  Name: Cassandra [name] MRN: [id number] Date of Birth: Oct 19, 1932  Today's Date: 12/18/2020 Time: SLP Start Time (ACUTE ONLY): 1240 -SLP Stop Time (ACUTE ONLY): 1303  SLP Time Calculation (min) (ACUTE ONLY): 23 min   Past Medical History:  Past Medical History:  Diagnosis Date  . Acquired hypothyroidism 01/06/2014  . Anxiety   . Arthritis   . B12 deficiency 02/15/2014  . Benign essential hypertension 01/06/2014  . Breast cancer [facility]) 1982   Right breast cancer - chemotherapy  . CAD (coronary artery disease), native coronary artery 01/06/2014  . Carotid artery calcification   . Chronic airway obstruction, not elsewhere classified 01/06/2014  . Depression   . Diabetes mellitus without complication ([place])   . History of kidney stones 2019   left ureteral stone  . Incontinence of urine   . Myocardial infarction ([place]) 1982   small infarct  . Neuromuscular disorder ([facility])    restless legs  . Pacemaker   . Presence of permanent cardiac pacemaker 07/2016  . Pure hypercholesterolemia 01/06/2014  . Renal insufficiency   . Skin cancer   .  Sleep apnea    uses cpap   Past Surgical History:  Past Surgical History:  Procedure Laterality Date  . APPENDECTOMY    . AUGMENTATION MAMMAPLASTY Bilateral 1982/redo in 2014   prior mastectomy  . CARDIAC CATHETERIZATION N/A 04/23/2016   Procedure: Left Heart Cath and Coronary Angiography;  Surgeon: Corey Skains, MD;  Location: [place] [facility];  Service: Cardiovascular;  Laterality: N/A;  . CARDIAC CATHETERIZATION Left 04/23/2016   Procedure: Coronary Stent Intervention;  Surgeon: Yolonda Kida, MD;  Location: [place] [facility];  Service: Cardiovascular;  Laterality: Left;  . CAROTID ARTERY ANGIOPLASTY Left 2010   had stent inserted and removed d/t infection. artery from leg inserted in left carotid  . CAROTID ENDARTERECTOMY Left 2010  . CHOLECYSTECTOMY    . CORONARY ANGIOPLASTY WITH STENT PLACEMENT  september 9th 2017  . CYSTOSCOPY W/ URETERAL STENT PLACEMENT Left 08/17/2017   Procedure: CYSTOSCOPY WITH RETROGRADE PYELOGRAM/URETERAL STENT PLACEMENT;  Surgeon: Festus Aloe, MD;  Location: [facility];  Service: Urology;  Laterality: Left;  . CYSTOSCOPY W/ URETERAL STENT PLACEMENT Left 09/16/2017   Procedure: CYSTOSCOPY WITH STENT REPLACEMENT;  Surgeon: Abbie Sons, MD;  Location: [facility];  Service: Urology;  Laterality: Left;  . CYSTOSCOPY/RETROGRADE/URETEROSCOPY/STONE EXTRACTION WITH BASKET Left 09/16/2017   Procedure: CYSTOSCOPY/RETROGRADE/URETEROSCOPY/STONE EXTRACTION WITH BASKET;  Surgeon: Abbie Sons, MD;  Location:  [facility];  Service: Urology;  Laterality: Left;  . EYE SURGERY Bilateral    cataract extraction  . KYPHOPLASTY N/A 02/03/2018   Procedure: [id number];  Surgeon: Hessie Knows, MD;  Location: [facility];  Service: Orthopedics;  Laterality: N/A;  . MASTECTOMY Right 1982  . PACEMAKER INSERTION Left 07/03/2016   Procedure: INSERTION PACEMAKER;  Surgeon: Isaias Cowman, MD;  Location: [facility];  Service: Cardiovascular;  Laterality: Left;  .  SKIN CANCER EXCISION    . TUBAL LIGATION    . VISCERAL ANGIOGRAPHY N/A 11/03/2019   Procedure: VISCERAL ANGIOGRAPHY;  Surgeon: Katha Cabal, MD;  Location: [place] [facility];  Service: Cardiovascular;  Laterality: N/A;   HPI: Patient is an 87 y.o. female with history of hypothyroidism, HTN, CAD, DM II, NSTEMI, COPD, on home 02, lower esophageal stricture, "progressive esophageal dysphagia," recent hospital admission in January 2022 for Covid-19. Patient reports she was supposed to have her esophagus stretched a couple of years ago, but the appointment was never made. Barium swallow from 11/2019 showed no stricture, barium study 01/2018 showed mild smooth narrowing in distal esophagus restricting passage of 13 mm barium tablet. No initial aspiration was noted on the 6/19 exam, however reflux with aspiration in supine. Per referring provider report, pt complained of dysphagia to solids and liquids. Patient today reports difficulty only with liquids, and denies difficulty with pills, meats, breads or other solid foods.   Subjective: pleasant, cooperative    Assessment / Plan / Recommendation  CHL IP CLINICAL IMPRESSIONS 12/18/2020  Clinical Impression Patient presents with grossly functional oropharyngeal swallowing, in keeping with normative age-related changes in swallow function. Oral stage characterized by adequate oral containment, rotary mastication, coordination and anterior-to-posterior transfer. Swallow initiation occurs at the valleculae, but with thin liquids is delayed to the level of the pyriform sinuses. This results in shallow laryngeal penetration before the swallow due to delayed laryngeal closure, which is transient during 3/4 occurences, and with trace shallow residual x1 which pt clears with throat clear. There is no aspiration. Pharyngeal stage is characterized by adequate tongue base retraction, hyolaryngeal excursion and pharyngeal constriction. There is no abnormal residue  remaining in the pharynx post-swallow. Amplitude/duration of cricopharyngeus opening is within functional limits; prominent cricopharyngeus does not impede bolus flow or clearance, which was adequate through the cervical esophagus. Patient reported symptoms of clearing her throat, coughing with liquids 1-2 times per week; feel these symptoms may correlate with laryngeal penetration, however pt appears to have adequate sensation and airway protection; no further ST is indicated at this time. Patient was educated regarding the study findings using images for teachback. Patient denied difficulty with solids to SLP during exam today; consider esophageal assessment should pt report difficulties. Recommend she continue regular diet with thin liquids; general aspiration precautions: slow rate, small bites and sips, upright posture for all intake.  SLP Visit Diagnosis Dysphagia, pharyngeal phase (R13.13);Dysphagia, pharyngoesophageal phase (R13.14)  Attention and concentration deficit following --  Frontal lobe and executive function deficit following --  Impact on safety and function Mild aspiration risk      CHL IP TREATMENT RECOMMENDATION 12/18/2020  Treatment Recommendations No treatment recommended at this time     No flowsheet data found.  CHL IP DIET RECOMMENDATION 12/18/2020  SLP Diet Recommendations Regular solids;Thin liquid  Liquid Administration via Cup  Medication Administration Whole meds with liquid  Compensations Slow rate;Small sips/bites;Clear throat intermittently  Postural Changes Remain semi-upright after after feeds/meals (Comment);Seated upright at 90 degrees  CHL IP OTHER RECOMMENDATIONS 12/18/2020  Recommended Consults Consider esophageal assessment  Oral Care Recommendations Oral care BID  Other Recommendations --      CHL IP FOLLOW UP RECOMMENDATIONS 12/18/2020  Follow up Recommendations None      No flowsheet data found.         CHL IP ORAL PHASE 12/18/2020  Oral  Phase WFL  Oral - Pudding Teaspoon --  Oral - Pudding Cup --  Oral - Honey Teaspoon --  Oral - Honey Cup --  Oral - Nectar Teaspoon --  Oral - Nectar Cup --  Oral - Nectar Straw --  Oral - Thin Teaspoon --  Oral - Thin Cup --  Oral - Thin Straw --  Oral - Puree --  Oral - Mech Soft --  Oral - Regular --  Oral - Multi-Consistency --  Oral - Pill --  Oral Phase - Comment --    CHL IP PHARYNGEAL PHASE 12/18/2020  Pharyngeal Phase WFL  Pharyngeal- Pudding Teaspoon --  Pharyngeal --  Pharyngeal- Pudding Cup --  Pharyngeal --  Pharyngeal- Honey Teaspoon --  Pharyngeal --  Pharyngeal- Honey Cup --  Pharyngeal --  Pharyngeal- Nectar Teaspoon --  Pharyngeal --  Pharyngeal- Nectar Cup Delayed swallow initiation-vallecula  Pharyngeal Material does not enter airway  Pharyngeal- Nectar Straw --  Pharyngeal --  Pharyngeal- Thin Teaspoon --  Pharyngeal --  Pharyngeal- Thin Cup Delayed swallow initiation-pyriform sinuses;Penetration/Aspiration before swallow  Pharyngeal Material enters airway, remains ABOVE vocal cords then ejected out  Pharyngeal- Thin Straw --  Pharyngeal --  Pharyngeal- Puree Delayed swallow initiation-vallecula  Pharyngeal Material does not enter airway  Pharyngeal- Mechanical Soft Delayed swallow initiation-vallecula  Pharyngeal Material does not enter airway  Pharyngeal- Regular --  Pharyngeal --  Pharyngeal- Multi-consistency --  Pharyngeal --  Pharyngeal- Pill --  Pharyngeal --  Pharyngeal Comment --     CHL IP CERVICAL ESOPHAGEAL PHASE 12/18/2020  Cervical Esophageal Phase WFL  Pudding Teaspoon --  Pudding Cup --  Honey Teaspoon --  Honey Cup --  Nectar Teaspoon --  Nectar Cup --  Nectar Straw --  Thin Teaspoon --  Thin Cup --  Thin Straw --  Puree --  Mechanical Soft --  Regular --  Multi-consistency --  Pill --  Cervical Esophageal Comment Prominent cricopharyngeus does not impede bolus flow or clearance.     Aliene Altes 12/18/2020, 5:12 PM   Deneise Lever, MS, CCC-SLP Speech-Language Pathologist     Dysphagia, pharyngoesophageal - Plan: DG SWALLOW FUNC OP MEDICARE SPEECH PATH, DG SWALLOW FUNC OP MEDICARE SPEECH PATH        Problem List Patient Active Problem List   Diagnosis Date Noted  . Acute hypoxemic respiratory failure due to COVID-19 ([place]) 09/28/2020  . Urge incontinence 07/16/2020  . History of nephrolithiasis 07/16/2020  . 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 Acute vomiting 11/01/2019  . Dizziness 11/01/2019  . Falls frequently 11/01/2019  . Visual hallucinations 11/01/2019  . Loss of weight 10/21/2019  . Carotid stenosis 10/20/2019  . Pacemaker 10/08/2019  . Moderate episode of recurrent major depressive disorder ([place]) 12/14/2018  . Near syncope 10/04/2018  . Non-STEMI (non-ST elevated myocardial infarction) ([place]) 08/18/2018  . Abnormal UGI series 03/31/2018  . Lymphedema 03/01/2018  . Acute respiratory failure with hypoxia ([place]) 09/16/2017  . Sepsis ([place]) 08/17/2017  . UTI (urinary tract infection) 08/17/2017  . Acute respiratory distress 08/17/2017  . 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  Sick sinus syndrome ([place]) 07/03/2016  . New onset atrial flutter ([place]) 06/14/2016  . Bradycardia, sinus 06/07/2016  . Syncope 06/06/2016  . Chest pain 04/21/2016  . Elevated troponin 04/21/2016  . Labile hypertension 04/21/2016  . Ischemic chest pain ([place]) 04/21/2016  . Long-term insulin use ([place]) 05/24/2014  . Mild vitamin D deficiency 05/20/2014  . Vitamin D deficiency 05/20/2014  . B12 deficiency 02/15/2014  . Acquired hypothyroidism 01/06/2014  . Chronic airway obstruction, not elsewhere classified 01/06/2014  . CAD (coronary artery disease), native coronary artery 01/06/2014  . Benign essential hypertension 01/06/2014  . Headache 01/06/2014  . Hypersomnia with sleep apnea 01/06/2014  . Pure hypercholesterolemia 01/06/2014  . Type II  diabetes mellitus with manifestations ([place]) 01/06/2014  . Hyperlipidemia associated with type 2 diabetes mellitus ([place]) 01/06/2014    Aliene Altes 12/18/2020, 5:11 PM  [place], [place], [address] Phone: [phone]   Fax:     Name: MIKAELLA ESCALONA MRN: [id number] Date of Birth: 06-Apr-1933

## 2020-12-25 ENCOUNTER — Ambulatory Visit (INDEPENDENT_AMBULATORY_CARE_PROVIDER_SITE_OTHER): Payer: Medicare Other

## 2020-12-25 ENCOUNTER — Encounter (INDEPENDENT_AMBULATORY_CARE_PROVIDER_SITE_OTHER): Payer: Self-pay | Admitting: Vascular Surgery

## 2020-12-25 ENCOUNTER — Ambulatory Visit (INDEPENDENT_AMBULATORY_CARE_PROVIDER_SITE_OTHER): Payer: Medicare Other | Admitting: Vascular Surgery

## 2020-12-25 ENCOUNTER — Other Ambulatory Visit: Payer: Self-pay

## 2020-12-25 VITALS — BP 190/83 | HR 87 | Resp 16 | Wt 167.6 lb

## 2020-12-25 DIAGNOSIS — E118 Type 2 diabetes mellitus with unspecified complications: Secondary | ICD-10-CM

## 2020-12-25 DIAGNOSIS — K551 Chronic vascular disorders of intestine: Secondary | ICD-10-CM

## 2020-12-25 DIAGNOSIS — I6523 Occlusion and stenosis of bilateral carotid arteries: Secondary | ICD-10-CM

## 2020-12-25 DIAGNOSIS — I25118 Atherosclerotic heart disease of native coronary artery with other forms of angina pectoris: Secondary | ICD-10-CM

## 2020-12-25 DIAGNOSIS — I10 Essential (primary) hypertension: Secondary | ICD-10-CM

## 2020-12-25 NOTE — Progress Notes (Signed)
MRN : [id number]  ZILDA NO is a 87 y.o. ([birth year]) female who presents with chief complaint of No chief complaint on file. [name]  History of Present Illness:   The patient returns to the office for followup regarding carotid stenosis as well as mesenteric stenosis status post intervention.   11/03/2019: Balloon expandable stent placement to the superior mesenteric artery using a 6 mm diameter x 16 mm length lifestream balloon expandable stent and then a 7 mm x 16 mm length lifestream in series  Today she denies amaurosis fugax TIA or recent neurological changes.  She also denies postprandial pain or other abdominal complaints.  She denies weight loss.  Significant change in patient's health care.  Since her COVID-19 infection her pulmonary status has been compromised and she is now utilizing oxygen at home  The patient denies amaurosis fugax or recent TIA symptoms. There are no recent neurological changes noted. The patient denies history of DVT, PE or superficial thrombophlebitis. The patient denies recent episodes of angina or shortness of breath.   Duplex US of the mesenteric arteries shows widely patent celiac SMA stent is patent with velocities of approximately 440 cm/s.  Previous study showed velocities of 410 cm/s.  There does not appear to be significant difference.  Duplex US of the carotid arteries shows 40 to 59% diameter reduction of the bilateral ICAs.   No outpatient medications have been marked as taking for the 12/25/20 encounter (Appointment) with Delana Meyer, Dolores Lory, MD.    Past Medical History:  Diagnosis Date  . Acquired hypothyroidism 01/06/2014  . Anxiety   . Arthritis   . B12 deficiency 02/15/2014  . Benign essential hypertension 01/06/2014  . Breast cancer [facility]) 1982   Right breast cancer - chemotherapy  . CAD (coronary artery disease), native coronary artery 01/06/2014  . Carotid artery calcification   . Chronic airway obstruction, not elsewhere  classified 01/06/2014  . Depression   . Diabetes mellitus without complication ([place])   . History of kidney stones 2019   left ureteral stone  . Incontinence of urine   . Myocardial infarction ([place]) 1982   small infarct  . Neuromuscular disorder (HCC)    restless legs  . Pacemaker   . Presence of permanent cardiac pacemaker 07/2016  . Pure hypercholesterolemia 01/06/2014  . Renal insufficiency   . Skin cancer   . Sleep apnea    uses cpap    Past Surgical History:  Procedure Laterality Date  . APPENDECTOMY    . AUGMENTATION MAMMAPLASTY Bilateral 1982/redo in 2014   prior mastectomy  . CARDIAC CATHETERIZATION N/A 04/23/2016   Procedure: Left Heart Cath and Coronary Angiography;  Surgeon: Corey Skains, MD;  Location: [place] CV LAB;  Service: Cardiovascular;  Laterality: N/A;  . CARDIAC CATHETERIZATION Left 04/23/2016   Procedure: Coronary Stent Intervention;  Surgeon: Yolonda Kida, MD;  Location: [place] CV LAB;  Service: Cardiovascular;  Laterality: Left;  . CAROTID ARTERY ANGIOPLASTY Left 2010   had stent inserted and removed d/t infection. artery from leg inserted in left carotid  . CAROTID ENDARTERECTOMY Left 2010  . CHOLECYSTECTOMY    . CORONARY ANGIOPLASTY WITH STENT PLACEMENT  september 9th 2017  . CYSTOSCOPY W/ URETERAL STENT PLACEMENT Left 08/17/2017   Procedure: CYSTOSCOPY WITH RETROGRADE PYELOGRAM/URETERAL STENT PLACEMENT;  Surgeon: Festus Aloe, MD;  Location: [facility];  Service: Urology;  Laterality: Left;  . CYSTOSCOPY W/ URETERAL STENT PLACEMENT Left 09/16/2017   Procedure: CYSTOSCOPY WITH STENT REPLACEMENT;  Surgeon:  Stoioff, Ronda Fairly, MD;  Location: [facility];  Service: Urology;  Laterality: Left;  . CYSTOSCOPY/RETROGRADE/URETEROSCOPY/STONE EXTRACTION WITH BASKET Left 09/16/2017   Procedure: CYSTOSCOPY/RETROGRADE/URETEROSCOPY/STONE EXTRACTION WITH BASKET;  Surgeon: Abbie Sons, MD;  Location: [facility];  Service: Urology;  Laterality: Left;  .  EYE SURGERY Bilateral    cataract extraction  . KYPHOPLASTY N/A 02/03/2018   Procedure: [id number];  Surgeon: Hessie Knows, MD;  Location: [facility];  Service: Orthopedics;  Laterality: N/A;  . MASTECTOMY Right 1982  . PACEMAKER INSERTION Left 07/03/2016   Procedure: INSERTION PACEMAKER;  Surgeon: Isaias Cowman, MD;  Location: [facility];  Service: Cardiovascular;  Laterality: Left;  . SKIN CANCER EXCISION    . TUBAL LIGATION    . VISCERAL ANGIOGRAPHY N/A 11/03/2019   Procedure: VISCERAL ANGIOGRAPHY;  Surgeon: Katha Cabal, MD;  Location: [place] CV LAB;  Service: Cardiovascular;  Laterality: N/A;    Social History Social History   Tobacco Use  . Smoking status: Former Smoker    Packs/day: 0.50    Types: Cigarettes    Quit date: 09/02/1996    Years since quitting: 24.3  . Smokeless tobacco: Never Used  Vaping Use  . Vaping Use: Never used  Substance Use Topics  . Alcohol use: Not Currently    Alcohol/week: 1.0 standard drink    Types: 1 Glasses of wine per week  . Drug use: No    Family History Family History  Problem Relation Age of Onset  . Prostate cancer Neg Hx   . Kidney cancer Neg Hx   . Breast cancer Neg Hx     No Known Allergies   REVIEW OF SYSTEMS (Negative unless checked)  Constitutional: [] Weight loss  [] Fever  [] Chills Cardiac: [] Chest pain   [] Chest pressure   [] Palpitations   [] Shortness of breath when laying flat   [] Shortness of breath with exertion. Vascular:  [] Pain in legs with walking   [] Pain in legs at rest  [] History of DVT   [] Phlebitis   [] Swelling in legs   [] Varicose veins   [] Non-healing ulcers Pulmonary:   [] Uses home oxygen   [] Productive cough   [] Hemoptysis   [] Wheeze  [] COPD   [] Asthma Neurologic:  [] Dizziness   [] Seizures   [] History of stroke   [] History of TIA  [] Aphasia   [] Vissual changes   [] Weakness or numbness in arm   [] Weakness or numbness in leg Musculoskeletal:   [] Joint swelling   [] Joint pain   [] Low back  pain Hematologic:  [] Easy bruising  [] Easy bleeding   [] Hypercoagulable state   [] Anemic Gastrointestinal:  [] Diarrhea   [] Vomiting  [] Gastroesophageal reflux/heartburn   [] Difficulty swallowing. Genitourinary:  [] Chronic kidney disease   [] Difficult urination  [] Frequent urination   [] Blood in urine Skin:  [] Rashes   [] Ulcers  Psychological:  [] History of anxiety   []  History of major depression.  Physical Examination  There were no vitals filed for this visit. There is no height or weight on file to calculate BMI. Gen: WD/WN, NAD Head: [place]/AT, No temporalis wasting.  Ear/Nose/Throat: Hearing grossly intact, nares w/o erythema or drainage Eyes: PER, EOMI, sclera nonicteric.  Neck: Supple, no large masses.   Pulmonary:  Good air movement, no audible wheezing bilaterally, no use of accessory muscles.  Cardiac: RRR, no JVD Vascular:  Vessel Right Left  Radial Palpable Palpable  Gastrointestinal: Non-distended. No guarding/no peritoneal signs.  Musculoskeletal: M/S 5/5 throughout.  No deformity or atrophy.  Neurologic: CN 2-12 intact. Symmetrical.  Speech is fluent. Motor  exam as listed above. Psychiatric: Judgment intact, Mood & affect appropriate for pt's clinical situation. Dermatologic: No rashes or ulcers noted.  No changes consistent with cellulitis.   CBC Lab Results  Component Value Date   WBC 16.1 (H) 09/30/2020   HGB 11.9 (L) 09/30/2020   HCT 37.0 09/30/2020   MCV 85.8 09/30/2020   PLT 401 (H) 09/30/2020    BMET    Component Value Date/Time   NA 137 09/30/2020 0518   NA 140 02/23/2014 1406   K 4.8 09/30/2020 0518   K 3.9 02/23/2014 1406   CL 99 09/30/2020 0518   CL 103 02/23/2014 1406   CO2 28 09/30/2020 0518   CO2 30 02/23/2014 1406   GLUCOSE 234 (H) 09/30/2020 0518   GLUCOSE 70 02/23/2014 1406   BUN 42 (H) 09/30/2020 0518   BUN 16 02/23/2014 1406   CREATININE 1.07 (H) 09/30/2020 0518   CREATININE 0.75 02/23/2014 1406   CALCIUM 9.3 09/30/2020 0518    CALCIUM 9.3 02/23/2014 1406   GFRNONAA 51 (L) 09/30/2020 0518   GFRNONAA >60 02/23/2014 1406   GFRAA >60 11/03/2019 0731   GFRAA >60 02/23/2014 1406   CrCl cannot be calculated (Patient's most recent lab result is older than the maximum 21 days allowed.).  COAG Lab Results  Component Value Date   INR 0.99 08/18/2018   INR 1.22 08/17/2017   INR 1.01 06/27/2016    Radiology DG [facility] MEDICARE SPEECH PATH  Result Date: 12/18/2020 CLINICAL DATA:  Dysphagia. Cough/GE reflux disease/other secondary diagnosis EXAM: MODIFIED BARIUM SWALLOW TECHNIQUE: Different consistencies of barium were administered orally to the patient by the Speech Pathologist. Imaging of the pharynx was performed in the lateral projection. The radiologist was present in the fluoroscopy room for this study, providing personal supervision. FLUOROSCOPY TIME:  Fluoroscopy Time: 0.9 min Radiation Exposure Index (if provided by the fluoroscopic device): 1.9 mGy Number of Acquired Spot Images: 0 COMPARISON:  None. FINDINGS: Real-time fluoroscopy of the swallowing function was performed with a speech pathologist present. Multiple consistencies of barium were administered which included water, nectar, applesauce, and Graham cracker. Laryngeal penetration with thin liquid without tracheal aspiration. Clearing with coughing. Otherwise normal modified barium swallow. Otherwise no laryngeal penetration or tracheal aspiration. IMPRESSION: Modified barium swallow as detailed above. Please refer to the Speech Pathologists report for complete details and recommendations. Electronically Signed   By: Kathreen Devoid   On: 12/18/2020 14:11 Objective Swallowing Evaluation: Type of Study: MBS-Modified Barium Swallow Study  Patient Details Name: LONISHA BOBBY MRN: [id number] Date of Birth: 1933-03-19 Today's Date: 12/18/2020 Time: SLP Start Time (ACUTE ONLY): 1240 -SLP Stop Time (ACUTE ONLY): 1303 SLP Time Calculation (min) (ACUTE ONLY): 23 min  Past Medical History: Past Medical History: Diagnosis Date . Acquired hypothyroidism 01/06/2014 . Anxiety  . Arthritis  . B12 deficiency 02/15/2014 . Benign essential hypertension 01/06/2014 . Breast cancer [facility]) 1982  Right breast cancer - chemotherapy . CAD (coronary artery disease), native coronary artery 01/06/2014 . Carotid artery calcification  . Chronic airway obstruction, not elsewhere classified 01/06/2014 . Depression  . Diabetes mellitus without complication ([place])  . History of kidney stones 2019  left ureteral stone . Incontinence of urine  . Myocardial infarction ([place]) 1982  small infarct . Neuromuscular disorder (HCC)   restless legs . Pacemaker  . Presence of permanent cardiac pacemaker 07/2016 . Pure hypercholesterolemia 01/06/2014 . Renal insufficiency  . Skin cancer  . Sleep apnea   uses cpap Past Surgical History: Past  Surgical History: Procedure Laterality Date . APPENDECTOMY   . AUGMENTATION MAMMAPLASTY Bilateral 1982/redo in 2014  prior mastectomy . CARDIAC CATHETERIZATION N/A 04/23/2016  Procedure: Left Heart Cath and Coronary Angiography;  Surgeon: Corey Skains, MD;  Location: [place] CV LAB;  Service: Cardiovascular;  Laterality: N/A; . CARDIAC CATHETERIZATION Left 04/23/2016  Procedure: Coronary Stent Intervention;  Surgeon: Yolonda Kida, MD;  Location: [place] CV LAB;  Service: Cardiovascular;  Laterality: Left; . CAROTID ARTERY ANGIOPLASTY Left 2010  had stent inserted and removed d/t infection. artery from leg inserted in left carotid . CAROTID ENDARTERECTOMY Left 2010 . CHOLECYSTECTOMY   . CORONARY ANGIOPLASTY WITH STENT PLACEMENT  september 9th 2017 . CYSTOSCOPY W/ URETERAL STENT PLACEMENT Left 08/17/2017  Procedure: CYSTOSCOPY WITH RETROGRADE PYELOGRAM/URETERAL STENT PLACEMENT;  Surgeon: Festus Aloe, MD;  Location: [facility];  Service: Urology;  Laterality: Left; . CYSTOSCOPY W/ URETERAL STENT PLACEMENT Left 09/16/2017  Procedure: CYSTOSCOPY WITH STENT REPLACEMENT;   Surgeon: Abbie Sons, MD;  Location: [facility];  Service: Urology;  Laterality: Left; . CYSTOSCOPY/RETROGRADE/URETEROSCOPY/STONE EXTRACTION WITH BASKET Left 09/16/2017  Procedure: CYSTOSCOPY/RETROGRADE/URETEROSCOPY/STONE EXTRACTION WITH BASKET;  Surgeon: Abbie Sons, MD;  Location: [facility];  Service: Urology;  Laterality: Left; . EYE SURGERY Bilateral   cataract extraction . KYPHOPLASTY N/A 02/03/2018  Procedure: [id number];  Surgeon: Hessie Knows, MD;  Location: [facility];  Service: Orthopedics;  Laterality: N/A; . MASTECTOMY Right 1982 . PACEMAKER INSERTION Left 07/03/2016  Procedure: INSERTION PACEMAKER;  Surgeon: Isaias Cowman, MD;  Location: [facility];  Service: Cardiovascular;  Laterality: Left; . SKIN CANCER EXCISION   . TUBAL LIGATION   . VISCERAL ANGIOGRAPHY N/A 11/03/2019  Procedure: VISCERAL ANGIOGRAPHY;  Surgeon: Katha Cabal, MD;  Location: [place] [facility] LAB;  Service: Cardiovascular;  Laterality: N/A; HPI: Patient is an 87 y.o. female with history of hypothyroidism, HTN, CAD, DM II, NSTEMI, COPD, on home 02, lower esophageal stricture, "progressive esophageal dysphagia," recent hospital admission in January 2022 for Covid-19. Patient reports she was supposed to have her esophagus stretched a couple of years ago, but the appointment was never made. Barium swallow from 11/2019 showed no stricture, barium study 01/2018 showed mild smooth narrowing in distal esophagus restricting passage of 13 mm barium tablet. No initial aspiration was noted on the 6/19 exam, however reflux with aspiration in supine. Per referring provider report, pt complained of dysphagia to solids and liquids. Patient today reports difficulty only with liquids, and denies difficulty with pills, meats, breads or other solid foods.  Subjective: pleasant, cooperative Assessment / Plan / Recommendation CHL IP CLINICAL IMPRESSIONS 12/18/2020 Clinical Impression Patient presents with grossly functional oropharyngeal  swallowing, in keeping with normative age-related changes in swallow function. Oral stage characterized by adequate oral containment, rotary mastication, coordination and anterior-to-posterior transfer. Swallow initiation occurs at the valleculae, but with thin liquids is delayed to the level of the pyriform sinuses. This results in shallow laryngeal penetration before the swallow due to delayed laryngeal closure, which is transient during 3/4 occurences, and with trace shallow residual x1 which pt clears with throat clear. There is no aspiration. Pharyngeal stage is characterized by adequate tongue base retraction, hyolaryngeal excursion and pharyngeal constriction. There is no abnormal residue remaining in the pharynx post-swallow. Amplitude/duration of cricopharyngeus opening is within functional limits; prominent cricopharyngeus does not impede bolus flow or clearance, which was adequate through the cervical esophagus. Patient reported symptoms of clearing her throat, coughing with liquids 1-2 times per week; feel these symptoms may correlate with laryngeal penetration,  however pt appears to have adequate sensation and airway protection; no further ST is indicated at this time. Patient was educated regarding the study findings using images for teachback. Patient denied difficulty with solids to SLP during exam today; consider esophageal assessment should pt report difficulties. Recommend she continue regular diet with thin liquids; general aspiration precautions: slow rate, small bites and sips, upright posture for all intake. SLP Visit Diagnosis Dysphagia, pharyngeal phase (R13.13);Dysphagia, pharyngoesophageal phase (R13.14) Attention and concentration deficit following -- Frontal lobe and executive function deficit following -- Impact on safety and function Mild aspiration risk   CHL IP TREATMENT RECOMMENDATION 12/18/2020 Treatment Recommendations No treatment recommended at this time   No flowsheet data  found. CHL IP DIET RECOMMENDATION 12/18/2020 SLP Diet Recommendations Regular solids;Thin liquid Liquid Administration via Cup Medication Administration Whole meds with liquid Compensations Slow rate;Small sips/bites;Clear throat intermittently Postural Changes Remain semi-upright after after feeds/meals (Comment);Seated upright at 90 degrees   CHL IP OTHER RECOMMENDATIONS 12/18/2020 Recommended Consults Consider esophageal assessment Oral Care Recommendations Oral care BID Other Recommendations --   CHL IP FOLLOW UP RECOMMENDATIONS 12/18/2020 Follow up Recommendations None   No flowsheet data found.     CHL IP ORAL PHASE 12/18/2020 Oral Phase WFL Oral - Pudding Teaspoon -- Oral - Pudding Cup -- Oral - Honey Teaspoon -- Oral - Honey Cup -- Oral - Nectar Teaspoon -- Oral - Nectar Cup -- Oral - Nectar Straw -- Oral - Thin Teaspoon -- Oral - Thin Cup -- Oral - Thin Straw -- Oral - Puree -- Oral - Mech Soft -- Oral - Regular -- Oral - Multi-Consistency -- Oral - Pill -- Oral Phase - Comment --  CHL IP PHARYNGEAL PHASE 12/18/2020 Pharyngeal Phase WFL Pharyngeal- Pudding Teaspoon -- Pharyngeal -- Pharyngeal- Pudding Cup -- Pharyngeal -- Pharyngeal- Honey Teaspoon -- Pharyngeal -- Pharyngeal- Honey Cup -- Pharyngeal -- Pharyngeal- Nectar Teaspoon -- Pharyngeal -- Pharyngeal- Nectar Cup Delayed swallow initiation-vallecula Pharyngeal Material does not enter airway Pharyngeal- Nectar Straw -- Pharyngeal -- Pharyngeal- Thin Teaspoon -- Pharyngeal -- Pharyngeal- Thin Cup Delayed swallow initiation-pyriform sinuses;Penetration/Aspiration before swallow Pharyngeal Material enters airway, remains ABOVE vocal cords then ejected out Pharyngeal- Thin Straw -- Pharyngeal -- Pharyngeal- Puree Delayed swallow initiation-vallecula Pharyngeal Material does not enter airway Pharyngeal- Mechanical Soft Delayed swallow initiation-vallecula Pharyngeal Material does not enter airway Pharyngeal- Regular -- Pharyngeal -- Pharyngeal-  Multi-consistency -- Pharyngeal -- Pharyngeal- Pill -- Pharyngeal -- Pharyngeal Comment --  CHL IP CERVICAL ESOPHAGEAL PHASE 12/18/2020 Cervical Esophageal Phase WFL Pudding Teaspoon -- Pudding Cup -- Honey Teaspoon -- Honey Cup -- Nectar Teaspoon -- Nectar Cup -- Nectar Straw -- Thin Teaspoon -- Thin Cup -- Thin Straw -- Puree -- Mechanical Soft -- Regular -- Multi-consistency -- Pill -- Cervical Esophageal Comment Prominent cricopharyngeus does not impede bolus flow or clearance. Aliene Altes 12/18/2020, 5:15 PM  Deneise Lever, MS, CCC-SLP Speech-Language Pathologist               Assessment/Plan 1. Mesenteric artery stenosis (HCC) Recommend:  The patient is status post successful angiogram with intervention of the mesenteric vessels.Stenting of the [employer] performed. The patient reports that the abdominal pain is improved and the post prandial symptoms are essentially gone. The patient denies lifestyle limiting changes at this point in time.  No further invasive studies, angiography or surgery at this time The patient should continue walking and begin a more formal exercise program.  The patient should continue antiplatelet therapy and aggressive treatment of the lipid  abnormalities  Patient should undergo noninvasive studies as ordered. The patient will follow up with me after the studies. - VAS [place] MESENTERIC; Future  2. Bilateral carotid artery stenosis Recommend:  Given the patient's asymptomatic subcritical stenosis no further invasive testing or surgery at this time.  Duplex US of the carotid arteries shows 40 to 59% diameter reduction of the bilateral ICAs.  Continue antiplatelet therapy as prescribed Continue management of CAD, HTN and Hyperlipidemia Healthy heart diet, encouraged exercise at least 4 times per week.  Follow up every31months with duplex ultrasound and physical exam  - VAS US CAROTID; Future  3. Coronary artery disease of native artery of  native heart with stable angina pectoris (HCC) Continue cardiac and antihypertensive medications as already ordered and reviewed, no changes at this time.  Continue statin as ordered and reviewed, no changes at this time  Nitrates PRN for chest pain   4. Benign essential hypertension Continue antihypertensive medications as already ordered, these medications have been reviewed and there are no changes at this time.   5. Type II diabetes mellitus with manifestations ([place]) Continue hypoglycemic medications as already ordered, these medications have been reviewed and there are no changes at this time.  Hgb A1C to be monitored as already arranged by primary service    Hortencia Pilar, MD  12/25/2020 10:35 AM

## 2021-02-12 ENCOUNTER — Other Ambulatory Visit (INDEPENDENT_AMBULATORY_CARE_PROVIDER_SITE_OTHER): Payer: Self-pay | Admitting: Vascular Surgery

## 2021-02-24 ENCOUNTER — Emergency Department (HOSPITAL_COMMUNITY): Payer: Medicare Other

## 2021-02-24 ENCOUNTER — Inpatient Hospital Stay (HOSPITAL_COMMUNITY): Payer: Medicare Other | Admitting: Anesthesiology

## 2021-02-24 ENCOUNTER — Inpatient Hospital Stay (HOSPITAL_COMMUNITY): Payer: Medicare Other

## 2021-02-24 ENCOUNTER — Other Ambulatory Visit: Payer: Self-pay

## 2021-02-24 ENCOUNTER — Inpatient Hospital Stay (HOSPITAL_COMMUNITY)
Admission: EM | Admit: 2021-02-24 | Discharge: 2021-03-16 | DRG: 958 | Disposition: A | Discharge status: Rehabilitation Facility | Payer: Medicare Other | Attending: Internal Medicine | Admitting: Internal Medicine

## 2021-02-24 ENCOUNTER — Encounter (HOSPITAL_COMMUNITY)
Admission: EM | Disposition: A | Discharge status: Rehabilitation Facility | Payer: Self-pay | Source: Home / Self Care | Attending: Internal Medicine

## 2021-02-24 DIAGNOSIS — R778 Other specified abnormalities of plasma proteins: Secondary | ICD-10-CM | POA: Diagnosis not present

## 2021-02-24 DIAGNOSIS — Z794 Long term (current) use of insulin: Secondary | ICD-10-CM

## 2021-02-24 DIAGNOSIS — N1832 Chronic kidney disease, stage 3b: Secondary | ICD-10-CM | POA: Diagnosis present

## 2021-02-24 DIAGNOSIS — S82852S Displaced trimalleolar fracture of left lower leg, sequela: Secondary | ICD-10-CM | POA: Diagnosis not present

## 2021-02-24 DIAGNOSIS — Z9882 Breast implant status: Secondary | ICD-10-CM

## 2021-02-24 DIAGNOSIS — I248 Other forms of acute ischemic heart disease: Secondary | ICD-10-CM | POA: Diagnosis not present

## 2021-02-24 DIAGNOSIS — D509 Iron deficiency anemia, unspecified: Secondary | ICD-10-CM | POA: Diagnosis present

## 2021-02-24 DIAGNOSIS — S82852B Displaced trimalleolar fracture of left lower leg, initial encounter for open fracture type I or II: Secondary | ICD-10-CM | POA: Diagnosis present

## 2021-02-24 DIAGNOSIS — E039 Hypothyroidism, unspecified: Secondary | ICD-10-CM | POA: Diagnosis present

## 2021-02-24 DIAGNOSIS — Z9010 Acquired absence of unspecified breast and nipple: Secondary | ICD-10-CM

## 2021-02-24 DIAGNOSIS — I10 Essential (primary) hypertension: Secondary | ICD-10-CM

## 2021-02-24 DIAGNOSIS — I959 Hypotension, unspecified: Secondary | ICD-10-CM

## 2021-02-24 DIAGNOSIS — Z85528 Personal history of other malignant neoplasm of kidney: Secondary | ICD-10-CM

## 2021-02-24 DIAGNOSIS — E7800 Pure hypercholesterolemia, unspecified: Secondary | ICD-10-CM

## 2021-02-24 DIAGNOSIS — E861 Hypovolemia: Secondary | ICD-10-CM | POA: Diagnosis present

## 2021-02-24 DIAGNOSIS — S82832A Other fracture of upper and lower end of left fibula, initial encounter for closed fracture: Secondary | ICD-10-CM

## 2021-02-24 DIAGNOSIS — I358 Other nonrheumatic aortic valve disorders: Secondary | ICD-10-CM | POA: Diagnosis present

## 2021-02-24 DIAGNOSIS — E119 Type 2 diabetes mellitus without complications: Secondary | ICD-10-CM | POA: Diagnosis not present

## 2021-02-24 DIAGNOSIS — T148XXA Other injury of unspecified body region, initial encounter: Secondary | ICD-10-CM

## 2021-02-24 DIAGNOSIS — J432 Centrilobular emphysema: Secondary | ICD-10-CM | POA: Diagnosis present

## 2021-02-24 DIAGNOSIS — S82872B Displaced pilon fracture of left tibia, initial encounter for open fracture type I or II: Principal | ICD-10-CM | POA: Diagnosis present

## 2021-02-24 DIAGNOSIS — E1122 Type 2 diabetes mellitus with diabetic chronic kidney disease: Secondary | ICD-10-CM | POA: Diagnosis present

## 2021-02-24 DIAGNOSIS — I129 Hypertensive chronic kidney disease with stage 1 through stage 4 chronic kidney disease, or unspecified chronic kidney disease: Secondary | ICD-10-CM | POA: Diagnosis present

## 2021-02-24 DIAGNOSIS — R61 Generalized hyperhidrosis: Secondary | ICD-10-CM

## 2021-02-24 DIAGNOSIS — Z79899 Other long term (current) drug therapy: Secondary | ICD-10-CM

## 2021-02-24 DIAGNOSIS — S82453A Displaced comminuted fracture of shaft of unspecified fibula, initial encounter for closed fracture: Secondary | ICD-10-CM | POA: Diagnosis present

## 2021-02-24 DIAGNOSIS — S82892C Other fracture of left lower leg, initial encounter for open fracture type IIIA, IIIB, or IIIC: Secondary | ICD-10-CM | POA: Diagnosis not present

## 2021-02-24 DIAGNOSIS — D62 Acute posthemorrhagic anemia: Secondary | ICD-10-CM | POA: Diagnosis not present

## 2021-02-24 DIAGNOSIS — Z8249 Family history of ischemic heart disease and other diseases of the circulatory system: Secondary | ICD-10-CM

## 2021-02-24 DIAGNOSIS — S82832B Other fracture of upper and lower end of left fibula, initial encounter for open fracture type I or II: Secondary | ICD-10-CM | POA: Diagnosis present

## 2021-02-24 DIAGNOSIS — Z20822 Contact with and (suspected) exposure to covid-19: Secondary | ICD-10-CM | POA: Diagnosis present

## 2021-02-24 DIAGNOSIS — E860 Dehydration: Secondary | ICD-10-CM | POA: Diagnosis present

## 2021-02-24 DIAGNOSIS — I2583 Coronary atherosclerosis due to lipid rich plaque: Secondary | ICD-10-CM | POA: Diagnosis not present

## 2021-02-24 DIAGNOSIS — S82892A Other fracture of left lower leg, initial encounter for closed fracture: Secondary | ICD-10-CM | POA: Diagnosis not present

## 2021-02-24 DIAGNOSIS — E538 Deficiency of other specified B group vitamins: Secondary | ICD-10-CM | POA: Diagnosis present

## 2021-02-24 DIAGNOSIS — D638 Anemia in other chronic diseases classified elsewhere: Secondary | ICD-10-CM | POA: Diagnosis present

## 2021-02-24 DIAGNOSIS — E1142 Type 2 diabetes mellitus with diabetic polyneuropathy: Secondary | ICD-10-CM | POA: Diagnosis present

## 2021-02-24 DIAGNOSIS — M25579 Pain in unspecified ankle and joints of unspecified foot: Secondary | ICD-10-CM | POA: Diagnosis not present

## 2021-02-24 DIAGNOSIS — E1151 Type 2 diabetes mellitus with diabetic peripheral angiopathy without gangrene: Secondary | ICD-10-CM | POA: Diagnosis present

## 2021-02-24 DIAGNOSIS — I700 Atherosclerosis of aorta: Secondary | ICD-10-CM | POA: Diagnosis present

## 2021-02-24 DIAGNOSIS — I2510 Atherosclerotic heart disease of native coronary artery without angina pectoris: Secondary | ICD-10-CM

## 2021-02-24 DIAGNOSIS — S066X1S Traumatic subarachnoid hemorrhage with loss of consciousness of 30 minutes or less, sequela: Secondary | ICD-10-CM | POA: Diagnosis not present

## 2021-02-24 DIAGNOSIS — Z9981 Dependence on supplemental oxygen: Secondary | ICD-10-CM

## 2021-02-24 DIAGNOSIS — S82209A Unspecified fracture of shaft of unspecified tibia, initial encounter for closed fracture: Secondary | ICD-10-CM | POA: Diagnosis present

## 2021-02-24 DIAGNOSIS — E1165 Type 2 diabetes mellitus with hyperglycemia: Secondary | ICD-10-CM | POA: Diagnosis present

## 2021-02-24 DIAGNOSIS — Z9842 Cataract extraction status, left eye: Secondary | ICD-10-CM

## 2021-02-24 DIAGNOSIS — N179 Acute kidney failure, unspecified: Secondary | ICD-10-CM | POA: Diagnosis present

## 2021-02-24 DIAGNOSIS — J9611 Chronic respiratory failure with hypoxia: Secondary | ICD-10-CM | POA: Diagnosis present

## 2021-02-24 DIAGNOSIS — Z6836 Body mass index (BMI) 36.0-36.9, adult: Secondary | ICD-10-CM

## 2021-02-24 DIAGNOSIS — S066X9A Traumatic subarachnoid hemorrhage with loss of consciousness of unspecified duration, initial encounter: Secondary | ICD-10-CM | POA: Diagnosis present

## 2021-02-24 DIAGNOSIS — Z9841 Cataract extraction status, right eye: Secondary | ICD-10-CM

## 2021-02-24 DIAGNOSIS — S9305XA Dislocation of left ankle joint, initial encounter: Secondary | ICD-10-CM | POA: Diagnosis present

## 2021-02-24 DIAGNOSIS — E876 Hypokalemia: Secondary | ICD-10-CM | POA: Diagnosis not present

## 2021-02-24 DIAGNOSIS — S301XXA Contusion of abdominal wall, initial encounter: Secondary | ICD-10-CM | POA: Diagnosis present

## 2021-02-24 DIAGNOSIS — N6310 Unspecified lump in the right breast, unspecified quadrant: Secondary | ICD-10-CM | POA: Diagnosis present

## 2021-02-24 DIAGNOSIS — M7989 Other specified soft tissue disorders: Secondary | ICD-10-CM | POA: Diagnosis not present

## 2021-02-24 DIAGNOSIS — Z23 Encounter for immunization: Secondary | ICD-10-CM | POA: Diagnosis present

## 2021-02-24 DIAGNOSIS — S93432A Sprain of tibiofibular ligament of left ankle, initial encounter: Secondary | ICD-10-CM | POA: Diagnosis present

## 2021-02-24 DIAGNOSIS — Z87891 Personal history of nicotine dependence: Secondary | ICD-10-CM

## 2021-02-24 DIAGNOSIS — E669 Obesity, unspecified: Secondary | ICD-10-CM | POA: Diagnosis present

## 2021-02-24 DIAGNOSIS — I609 Nontraumatic subarachnoid hemorrhage, unspecified: Secondary | ICD-10-CM

## 2021-02-24 DIAGNOSIS — T1490XA Injury, unspecified, initial encounter: Secondary | ICD-10-CM

## 2021-02-24 DIAGNOSIS — M109 Gout, unspecified: Secondary | ICD-10-CM | POA: Diagnosis present

## 2021-02-24 DIAGNOSIS — I272 Pulmonary hypertension, unspecified: Secondary | ICD-10-CM | POA: Diagnosis present

## 2021-02-24 DIAGNOSIS — Y92410 Unspecified street and highway as the place of occurrence of the external cause: Secondary | ICD-10-CM | POA: Diagnosis not present

## 2021-02-24 DIAGNOSIS — R0789 Other chest pain: Secondary | ICD-10-CM

## 2021-02-24 DIAGNOSIS — S066X9D Traumatic subarachnoid hemorrhage with loss of consciousness of unspecified duration, subsequent encounter: Secondary | ICD-10-CM | POA: Diagnosis not present

## 2021-02-24 DIAGNOSIS — Z955 Presence of coronary angioplasty implant and graft: Secondary | ICD-10-CM

## 2021-02-24 DIAGNOSIS — Z950 Presence of cardiac pacemaker: Secondary | ICD-10-CM

## 2021-02-24 DIAGNOSIS — S32019A Unspecified fracture of first lumbar vertebra, initial encounter for closed fracture: Secondary | ICD-10-CM | POA: Diagnosis present

## 2021-02-24 DIAGNOSIS — K5900 Constipation, unspecified: Secondary | ICD-10-CM | POA: Diagnosis not present

## 2021-02-24 DIAGNOSIS — Z9049 Acquired absence of other specified parts of digestive tract: Secondary | ICD-10-CM

## 2021-02-24 DIAGNOSIS — S92143A Displaced dome fracture of unspecified talus, initial encounter for closed fracture: Secondary | ICD-10-CM | POA: Diagnosis present

## 2021-02-24 DIAGNOSIS — Z9889 Other specified postprocedural states: Secondary | ICD-10-CM | POA: Diagnosis not present

## 2021-02-24 DIAGNOSIS — Z7902 Long term (current) use of antithrombotics/antiplatelets: Secondary | ICD-10-CM

## 2021-02-24 DIAGNOSIS — Z87442 Personal history of urinary calculi: Secondary | ICD-10-CM

## 2021-02-24 DIAGNOSIS — K219 Gastro-esophageal reflux disease without esophagitis: Secondary | ICD-10-CM | POA: Diagnosis present

## 2021-02-24 DIAGNOSIS — Z853 Personal history of malignant neoplasm of breast: Secondary | ICD-10-CM

## 2021-02-24 DIAGNOSIS — Z419 Encounter for procedure for purposes other than remedying health state, unspecified: Secondary | ICD-10-CM

## 2021-02-24 DIAGNOSIS — I252 Old myocardial infarction: Secondary | ICD-10-CM

## 2021-02-24 DIAGNOSIS — R296 Repeated falls: Secondary | ICD-10-CM | POA: Diagnosis present

## 2021-02-24 DIAGNOSIS — S82302B Unspecified fracture of lower end of left tibia, initial encounter for open fracture type I or II: Secondary | ICD-10-CM

## 2021-02-24 DIAGNOSIS — I517 Cardiomegaly: Secondary | ICD-10-CM | POA: Diagnosis not present

## 2021-02-24 DIAGNOSIS — G4733 Obstructive sleep apnea (adult) (pediatric): Secondary | ICD-10-CM | POA: Diagnosis present

## 2021-02-24 DIAGNOSIS — R7989 Other specified abnormal findings of blood chemistry: Secondary | ICD-10-CM | POA: Diagnosis not present

## 2021-02-24 HISTORY — PX: EXTERNAL FIXATION LEG: SHX1549

## 2021-02-24 HISTORY — PX: APPLICATION OF WOUND VAC: SHX5189

## 2021-02-24 HISTORY — PX: I & D EXTREMITY: SHX5045

## 2021-02-24 LAB — I-STAT CHEM 8, ED
BUN: 29 mg/dL — ABNORMAL HIGH (ref 8–23)
Calcium, Ion: 1.03 mmol/L — ABNORMAL LOW (ref 1.15–1.40)
Chloride: 108 mmol/L (ref 98–111)
Creatinine, Ser: 1.4 mg/dL — ABNORMAL HIGH (ref 0.44–1.00)
Glucose, Bld: 245 mg/dL — ABNORMAL HIGH (ref 70–99)
HCT: 28 % — ABNORMAL LOW (ref 36.0–46.0)
Hemoglobin: 9.5 g/dL — ABNORMAL LOW (ref 12.0–15.0)
Potassium: 4.1 mmol/L (ref 3.5–5.1)
Sodium: 140 mmol/L (ref 135–145)
TCO2: 21 mmol/L — ABNORMAL LOW (ref 22–32)

## 2021-02-24 LAB — CBC
HCT: 30.1 % — ABNORMAL LOW (ref 36.0–46.0)
Hemoglobin: 9 g/dL — ABNORMAL LOW (ref 12.0–15.0)
MCH: 27.2 pg (ref 26.0–34.0)
MCHC: 29.9 g/dL — ABNORMAL LOW (ref 30.0–36.0)
MCV: 90.9 fL (ref 80.0–100.0)
Platelets: 228 10*3/uL (ref 150–400)
RBC: 3.31 MIL/uL — ABNORMAL LOW (ref 3.87–5.11)
RDW: 16.4 % — ABNORMAL HIGH (ref 11.5–15.5)
WBC: 10.8 10*3/uL — ABNORMAL HIGH (ref 4.0–10.5)
nRBC: 0 % (ref 0.0–0.2)

## 2021-02-24 LAB — COMPREHENSIVE METABOLIC PANEL
ALT: 14 U/L (ref 0–44)
AST: 22 U/L (ref 15–41)
Albumin: 2.8 g/dL — ABNORMAL LOW (ref 3.5–5.0)
Alkaline Phosphatase: 47 U/L (ref 38–126)
Anion gap: 8 (ref 5–15)
BUN: 29 mg/dL — ABNORMAL HIGH (ref 8–23)
CO2: 22 mmol/L (ref 22–32)
Calcium: 8.4 mg/dL — ABNORMAL LOW (ref 8.9–10.3)
Chloride: 108 mmol/L (ref 98–111)
Creatinine, Ser: 1.49 mg/dL — ABNORMAL HIGH (ref 0.44–1.00)
GFR, Estimated: 34 mL/min — ABNORMAL LOW (ref >60)
Glucose, Bld: 252 mg/dL — ABNORMAL HIGH (ref 70–99)
Potassium: 4.1 mmol/L (ref 3.5–5.1)
Sodium: 138 mmol/L (ref 135–145)
Total Bilirubin: 0.1 mg/dL — ABNORMAL LOW (ref 0.3–1.2)
Total Protein: 5.7 g/dL — ABNORMAL LOW (ref 6.5–8.1)

## 2021-02-24 LAB — CBG MONITORING, ED: Glucose-Capillary: 304 mg/dL — ABNORMAL HIGH (ref 70–99)

## 2021-02-24 LAB — ETHANOL: Alcohol, Ethyl (B): <10 mg/dL (ref <10)

## 2021-02-24 LAB — LACTIC ACID, PLASMA
Lactic Acid, Venous: 3.1 mmol/L (ref 0.5–1.9)
Lactic Acid, Venous: 0.7 mmol/L (ref 0.5–1.9)

## 2021-02-24 LAB — PREPARE RBC (CROSSMATCH)

## 2021-02-24 LAB — SAMPLE TO BLOOD BANK

## 2021-02-24 LAB — RESP PANEL BY RT-PCR (FLU A&B, COVID) ARPGX2
Influenza A by PCR: NEGATIVE
Influenza B by PCR: NEGATIVE
SARS Coronavirus 2 by RT PCR: NEGATIVE

## 2021-02-24 LAB — PROTIME-INR
INR: 1.1 (ref 0.8–1.2)
Prothrombin Time: 14 seconds (ref 11.4–15.2)

## 2021-02-24 LAB — ABO/RH: ABO/RH(D): A POS

## 2021-02-24 SURGERY — IRRIGATION AND DEBRIDEMENT EXTREMITY
Anesthesia: General | Site: Ankle | Laterality: Left

## 2021-02-24 MED ORDER — HYDROMORPHONE HCL 1 MG/ML IJ SOLN
1.0000 mg | INTRAMUSCULAR | Status: DC | PRN
  Administered 2021-02-25: 1 mg via INTRAVENOUS
  Filled 2021-02-24: qty 1

## 2021-02-24 MED ORDER — SODIUM CHLORIDE 0.9 % IV BOLUS
1000.0000 mL | Freq: Once | INTRAVENOUS | Status: AC
  Administered 2021-02-24: 1000 mL via INTRAVENOUS

## 2021-02-24 MED ORDER — POLYETHYLENE GLYCOL 3350 17 G PO PACK: 17.0000 g | PACK | Freq: Every day | ORAL | Status: DC | PRN

## 2021-02-24 MED ORDER — PANTOPRAZOLE SODIUM 40 MG IV SOLR: 40.0000 mg | Freq: Once | INTRAVENOUS | Status: DC | PRN

## 2021-02-24 MED ORDER — SENNOSIDES-DOCUSATE SODIUM 8.6-50 MG PO TABS
2.0000 | ORAL_TABLET | Freq: Every day | ORAL | Status: AC
  Administered 2021-02-25 – 2021-02-28 (×3): 2 via ORAL
  Filled 2021-02-24 (×3): qty 2

## 2021-02-24 MED ORDER — IPRATROPIUM-ALBUTEROL 0.5-2.5 (3) MG/3ML IN SOLN: 3.0000 mL | RESPIRATORY_TRACT | Status: DC | PRN

## 2021-02-24 MED ORDER — CEFAZOLIN SODIUM-DEXTROSE 2-4 GM/100ML-% IV SOLN: 2.0000 g | Freq: Once | INTRAVENOUS | Status: DC

## 2021-02-24 MED ORDER — TETANUS-DIPHTH-ACELL PERTUSSIS 5-2.5-18.5 LF-MCG/0.5 IM SUSY
0.5000 mL | PREFILLED_SYRINGE | Freq: Once | INTRAMUSCULAR | Status: AC
  Administered 2021-02-24: 0.5 mL via INTRAMUSCULAR

## 2021-02-24 MED ORDER — PROPOFOL 10 MG/ML IV BOLUS
INTRAVENOUS | Status: AC
  Filled 2021-02-24: qty 40

## 2021-02-24 MED ORDER — LEVOTHYROXINE SODIUM 100 MCG/5ML IV SOLN
25.0000 ug | Freq: Every day | INTRAVENOUS | Status: DC
  Administered 2021-02-25: 25 ug via INTRAVENOUS
  Filled 2021-02-24: qty 5

## 2021-02-24 MED ORDER — PROPOFOL 10 MG/ML IV BOLUS
1.0000 mg/kg | Freq: Once | INTRAVENOUS | Status: AC
  Administered 2021-02-24: 0.1 mg via INTRAVENOUS
  Filled 2021-02-24: qty 20

## 2021-02-24 MED ORDER — PHENYLEPHRINE HCL-NACL 10-0.9 MG/250ML-% IV SOLN
INTRAVENOUS | Status: DC | PRN
  Administered 2021-02-24: 50 ug/min via INTRAVENOUS

## 2021-02-24 MED ORDER — INSULIN ASPART 100 UNIT/ML IJ SOLN
0.0000 [IU] | INTRAMUSCULAR | Status: DC
  Administered 2021-02-25: 7 [IU] via SUBCUTANEOUS

## 2021-02-24 MED ORDER — PANTOPRAZOLE SODIUM 40 MG IV SOLR: 40.0000 mg | INTRAVENOUS | Status: DC

## 2021-02-24 MED ORDER — FENTANYL CITRATE (PF) 250 MCG/5ML IJ SOLN
INTRAMUSCULAR | Status: DC | PRN
  Administered 2021-02-24 (×2): 50 ug via INTRAVENOUS
  Administered 2021-02-24: 100 ug via INTRAVENOUS
  Administered 2021-02-24: 50 ug via INTRAVENOUS

## 2021-02-24 MED ORDER — IOHEXOL 300 MG/ML  SOLN
80.0000 mL | Freq: Once | INTRAMUSCULAR | Status: AC | PRN
  Administered 2021-02-24: 80 mL via INTRAVENOUS

## 2021-02-24 MED ORDER — CLEVIDIPINE BUTYRATE 0.5 MG/ML IV EMUL
0.0000 mg/h | INTRAVENOUS | Status: DC
  Administered 2021-02-24: 1 mg/h via INTRAVENOUS
  Filled 2021-02-24: qty 50

## 2021-02-24 MED ORDER — FENTANYL CITRATE (PF) 100 MCG/2ML IJ SOLN
50.0000 ug | Freq: Once | INTRAMUSCULAR | Status: AC
  Administered 2021-02-24: 50 ug via INTRAVENOUS
  Filled 2021-02-24: qty 2

## 2021-02-24 MED ORDER — FENTANYL CITRATE (PF) 250 MCG/5ML IJ SOLN
INTRAMUSCULAR | Status: AC
  Filled 2021-02-24: qty 5

## 2021-02-24 MED ORDER — ALBUMIN HUMAN 5 % IV SOLN: INTRAVENOUS | Status: DC | PRN

## 2021-02-24 MED ORDER — LIDOCAINE HCL (CARDIAC) PF 100 MG/5ML IV SOSY
PREFILLED_SYRINGE | INTRAVENOUS | Status: DC | PRN
  Administered 2021-02-24: 60 mg via INTRATRACHEAL

## 2021-02-24 MED ORDER — PROPOFOL 10 MG/ML IV BOLUS
INTRAVENOUS | Status: AC | PRN
  Administered 2021-02-24: 10 mg via INTRAVENOUS
  Administered 2021-02-24: 40 mg via INTRAVENOUS

## 2021-02-24 MED ORDER — SODIUM CHLORIDE 0.9 % IR SOLN
Status: DC | PRN
  Administered 2021-02-24: 3000 mL

## 2021-02-24 MED ORDER — CEFAZOLIN SODIUM 1 G IJ SOLR
INTRAMUSCULAR | Status: AC
  Filled 2021-02-24: qty 40

## 2021-02-24 MED ORDER — OXYCODONE HCL 5 MG PO TABS
5.0000 mg | ORAL_TABLET | Freq: Four times a day (QID) | ORAL | Status: DC | PRN
  Administered 2021-02-25: 5 mg via ORAL
  Filled 2021-02-24: qty 1

## 2021-02-24 MED ORDER — SODIUM CHLORIDE 0.9% IV SOLUTION: Freq: Once | INTRAVENOUS | Status: DC

## 2021-02-24 MED ORDER — FENTANYL CITRATE (PF) 100 MCG/2ML IJ SOLN
100.0000 ug | Freq: Once | INTRAMUSCULAR | Status: AC
  Administered 2021-02-24: 100 ug via INTRAVENOUS
  Filled 2021-02-24: qty 2

## 2021-02-24 MED ORDER — MELATONIN 3 MG PO TABS
3.0000 mg | ORAL_TABLET | Freq: Every evening | ORAL | Status: DC | PRN
  Filled 2021-02-24: qty 1

## 2021-02-24 MED ORDER — LACTATED RINGERS IV BOLUS: 1000.0000 mL | Freq: Once | INTRAVENOUS | Status: DC

## 2021-02-24 MED ORDER — SUCCINYLCHOLINE CHLORIDE 20 MG/ML IJ SOLN
INTRAMUSCULAR | Status: DC | PRN
  Administered 2021-02-24: 100 mg via INTRAVENOUS

## 2021-02-24 MED ORDER — CEFAZOLIN SODIUM-DEXTROSE 2-3 GM-%(50ML) IV SOLR
INTRAVENOUS | Status: DC | PRN
  Administered 2021-02-24: 2 g via INTRAVENOUS

## 2021-02-24 MED ORDER — CEFAZOLIN SODIUM-DEXTROSE 2-4 GM/100ML-% IV SOLN: 2.0000 g | Freq: Three times a day (TID) | INTRAVENOUS | Status: DC

## 2021-02-24 MED ORDER — ETOMIDATE 2 MG/ML IV SOLN
INTRAVENOUS | Status: DC | PRN
  Administered 2021-02-24: 14 mg via INTRAVENOUS

## 2021-02-24 MED ORDER — PROCHLORPERAZINE EDISYLATE 10 MG/2ML IJ SOLN
10.0000 mg | Freq: Four times a day (QID) | INTRAMUSCULAR | Status: DC | PRN
  Filled 2021-02-24: qty 2

## 2021-02-24 MED ORDER — LACTATED RINGERS IV SOLN: INTRAVENOUS | Status: DC

## 2021-02-24 MED ORDER — LACTATED RINGERS IV SOLN: INTRAVENOUS | Status: DC | PRN

## 2021-02-24 MED ORDER — SODIUM CHLORIDE 0.9 % IV SOLN
20.0000 ug | Freq: Once | INTRAVENOUS | Status: AC
  Administered 2021-02-24: 20 ug via INTRAVENOUS
  Filled 2021-02-24: qty 5

## 2021-02-24 MED ORDER — ACETAMINOPHEN 325 MG PO TABS: 650.0000 mg | ORAL_TABLET | Freq: Four times a day (QID) | ORAL | Status: DC | PRN

## 2021-02-24 SURGICAL SUPPLY — 45 items
BANDAGE ESMARK 6X9 LF (GAUZE/BANDAGES/DRESSINGS) ×1 IMPLANT
BAR EXFX 400X11 NS LF (EXFIX) ×2
BAR GLASS FIBER EXFX 11X400 (EXFIX) ×4 IMPLANT
BNDG CMPR 9X6 STRL LF SNTH (GAUZE/BANDAGES/DRESSINGS) ×1
BNDG ELASTIC 6X5.8 VLCR STR LF (GAUZE/BANDAGES/DRESSINGS) ×2 IMPLANT
BNDG ESMARK 6X9 LF (GAUZE/BANDAGES/DRESSINGS) ×2
BNDG GAUZE ELAST 4 BULKY (GAUZE/BANDAGES/DRESSINGS) ×2 IMPLANT
CANISTER WOUNDNEG PRESSURE 500 (CANNISTER) ×2 IMPLANT
CAP PROTECTIVE TRANSFX 4.5X5MM (EXFIX) ×2 IMPLANT
CLAMP BLUE BAR TO PIN (EXFIX) ×4 IMPLANT
COVER SURGICAL LIGHT HANDLE (MISCELLANEOUS) ×2 IMPLANT
CUFF TOURN SGL QUICK 18X4 (TOURNIQUET CUFF) IMPLANT
CUFF TOURN SGL QUICK 34 (TOURNIQUET CUFF)
CUFF TRNQT CYL 34X4.125X (TOURNIQUET CUFF) IMPLANT
DRAPE DERMATAC (DRAPES) ×4 IMPLANT
DRESSING PEEL AND PLC PRVNA 13 (GAUZE/BANDAGES/DRESSINGS) ×1 IMPLANT
DRSG PAD ABDOMINAL 8X10 ST (GAUZE/BANDAGES/DRESSINGS) ×2 IMPLANT
DRSG PEEL AND PLACE PREVENA 13 (GAUZE/BANDAGES/DRESSINGS) ×2
DRSG XEROFORM 1X8 (GAUZE/BANDAGES/DRESSINGS) ×2 IMPLANT
DURAPREP 26ML APPLICATOR (WOUND CARE) ×2 IMPLANT
GAUZE SPONGE 4X4 12PLY STRL (GAUZE/BANDAGES/DRESSINGS) ×2 IMPLANT
GLOVE SURG ENC TEXT LTX SZ7 (GLOVE) ×2 IMPLANT
GLOVE SURG ENC TEXT LTX SZ7.5 (GLOVE) IMPLANT
GLOVE SURG LTX SZ8 (GLOVE) IMPLANT
GLOVE SURG ORTHO LTX SZ7.5 (GLOVE) ×4 IMPLANT
GLOVE SURG ORTHO LTX SZ8.5 (GLOVE) ×6 IMPLANT
GLOVE SURG UNDER POLY LF SZ7.5 (GLOVE) ×4 IMPLANT
GLOVE SURG UNDER POLY LF SZ8.5 (GLOVE) ×2 IMPLANT
GOWN STRL REUS W/ TWL LRG LVL3 (GOWN DISPOSABLE) ×1 IMPLANT
GOWN STRL REUS W/ TWL XL LVL3 (GOWN DISPOSABLE) ×3 IMPLANT
GOWN STRL REUS W/TWL LRG LVL3 (GOWN DISPOSABLE) ×2
GOWN STRL REUS W/TWL XL LVL3 (GOWN DISPOSABLE) ×6
HALF PIN 4MM (EXFIX) ×4 IMPLANT
HANDPIECE INTERPULSE COAX TIP (DISPOSABLE) ×4
KIT BASIN OR (CUSTOM PROCEDURE TRAY) ×2 IMPLANT
MANIFOLD NEPTUNE II (INSTRUMENTS) ×2 IMPLANT
PACK ORTHO EXTREMITY (CUSTOM PROCEDURE TRAY) ×2 IMPLANT
PAD CAST 4YDX4 CTTN HI CHSV (CAST SUPPLIES) ×1 IMPLANT
PADDING CAST COTTON 4X4 STRL (CAST SUPPLIES) ×2
PIN CLAMP 2BAR 75MM BLUE (EXFIX) ×2 IMPLANT
PIN TRANSFIXING 5.0 (EXFIX) ×2 IMPLANT
SET HNDPC FAN SPRY TIP SCT (DISPOSABLE) ×2 IMPLANT
SUT ETHILON 2 0 PSLX (SUTURE) ×4 IMPLANT
SYR CONTROL 10ML LL (SYRINGE) ×2 IMPLANT
TOWEL GREEN STERILE (TOWEL DISPOSABLE) ×4 IMPLANT

## 2021-02-24 NOTE — ED Notes (Signed)
Waiting call from OR .

## 2021-02-24 NOTE — Consult Note (Signed)
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 Persistent left ankle dislocation with non-alignment of the tibiotalar joint and tibia fibula. 2. Comminuted distal left fibula with slightly improved angulation. Associated a 2.2 cm posteromedially displaced and impacted fibular fracture. 3. Medial malleolar fracture poorly visualized. 4. Likely medial talar fracture. Electronically Signed   By: Iven Finn M.D.   On: 02/24/2021 19:40    Positive ROS: All other systems have been reviewed and were otherwise negative with the exception of those mentioned in the HPI and as above.  Physical Exam: General: Alert, no acute distress Cardiovascular: No pedal edema Respiratory: No cyanosis, no use of accessory musculature GI: No organomegaly, abdomen is soft and non-tender Skin: No lesions in the area of chief complaint Neurologic: Sensation intact distally Psychiatric: Patient is competent for consent with normal mood and affect Lymphatic: No axillary or cervical lymphadenopathy  MUSCULOSKELETAL: Examination of the left lower extremity reveals that she is in a  splint.  Her foot is warm and well-perfused.  She reports subjective sensory change that is baseline due to diabetic neuropathy.  She is able to wiggle her toes.  Assessment: Left open ankle fracture  dislocation  Plan: I discussed the findings with the patient and her family.  We discussed staged operative management.  Tonight, she needs debridement and placement of an external fixator.  We will get a postoperative CT scan.  She will need surgery next week, and I have already discussed her with Dr. Doreatha Martin.  All questions were solicited and answered.    Bertram Savin, MD [phone]    02/24/2021 9:25 PM

## 2021-02-24 NOTE — Anesthesia Preprocedure Evaluation (Signed)
Anesthesia Evaluation  Patient identified by MRN, date of birth, ID band Patient awake    Reviewed: Allergy & Precautions, NPO status   Airway Mallampati: II  TM Distance: >3 FB     Dental   Pulmonary    breath sounds clear to auscultation       Cardiovascular + CAD and + Peripheral Vascular Disease   Rhythm:Regular Rate:Normal  History noted Dr. Nyoka Cowden   Neuro/Psych    GI/Hepatic negative GI ROS, Neg liver ROS,   Endo/Other  diabetes  Renal/GU negative Renal ROS     Musculoskeletal   Abdominal   Peds  Hematology   Anesthesia Other Findings   Reproductive/Obstetrics                             Anesthesia Physical Anesthesia Plan  ASA: 3 and emergent  Anesthesia Plan: General   Post-op Pain Management:    Induction:   PONV Risk Score and Plan: 3 and Ondansetron and Dexamethasone  Airway Management Planned: Oral ETT  Additional Equipment:   Intra-op Plan:   Post-operative Plan: Possible Post-op intubation/ventilation  Informed Consent: I have reviewed the patients History and Physical, chart, labs and discussed the procedure including the risks, benefits and alternatives for the proposed anesthesia with the patient or authorized representative who has indicated his/her understanding and acceptance.     Dental advisory given  Plan Discussed with: CRNA and Anesthesiologist  Anesthesia Plan Comments:         Anesthesia Quick Evaluation

## 2021-02-24 NOTE — Anesthesia Procedure Notes (Signed)
Procedure Name: Intubation Date/Time: 02/24/2021 10:10 PM Performed by: Clovis Cao, CRNA Pre-anesthesia Checklist: Patient identified, Emergency Drugs available, Suction available and Patient being monitored Patient Re-evaluated:Patient Re-evaluated prior to induction Oxygen Delivery Method: Circle system utilized Preoxygenation: Pre-oxygenation with 100% oxygen Induction Type: IV induction, Rapid sequence and Cricoid Pressure applied Laryngoscope Size: Miller and 2 Grade View: Grade I Tube type: Oral Tube size: 7.0 mm Number of attempts: 1 Airway Equipment and Method: Stylet and Oral airway Placement Confirmation: ETT inserted through vocal cords under direct vision, positive ETCO2 and breath sounds checked- equal and bilateral Secured at: 21 cm Tube secured with: Tape Dental Injury: Teeth and Oropharynx as per pre-operative assessment

## 2021-02-24 NOTE — Progress Notes (Signed)
Orthopedic Tech Progress Note Patient Details:  Cassandra [name] 1932/09/26 [id number] Level 2 Trauma Ortho Devices Type of Ortho Device: Post (short leg) splint, Stirrup splint Ortho Device/Splint Location: lle Ortho Device/Splint Interventions: Ordered, Application   Post Interventions Patient Tolerated: Well  Tarvares Lant A Stephani Janak 02/24/2021, 6:07 PM

## 2021-02-24 NOTE — Progress Notes (Signed)
Patient arrived to PACU from OR with one unit PRBC's infusing, started by anesthesia in OR. Pt tolerating well. 02/24/2021 @ 2357 Cyndi Bender, RN

## 2021-02-24 NOTE — ED Notes (Signed)
Patient transported to CT 

## 2021-02-24 NOTE — ED Notes (Signed)
2nd pink top tube for type and screen collected and sent to lab.

## 2021-02-24 NOTE — Progress Notes (Signed)
Neurosurgery  Reviewed CT head.  R parietal lobe hyderdensity in the subarachnoid space appear more consistent with dystrophic calcification appears than traumatic subarachnoid hemorrhage or contusion.  Nevertheless, would recommend repeat CT head in am, hold Plavix, keep MAPs 70-100.  No need for AED prophylaxis.  No neurosurgical intervention is foreseen.
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
## 2021-02-24 NOTE — ED Notes (Signed)
Transported to OR

## 2021-02-24 NOTE — Transfer of Care (Signed)
Immediate Anesthesia Transfer of Care Note  Patient: Cassandra [name]  Procedure(s) Performed: IRRIGATION AND DEBRIDEMENT EXTREMITY (Left: Ankle) APPLICATION OF WOUND VAC (Left: Ankle) EXTERNAL FIXATION  ANKLE (Left: Ankle)  Patient Location: PACU  Anesthesia Type:General  Level of Consciousness: awake  Airway & Oxygen Therapy: Patient Spontanous Breathing and Patient connected to face mask oxygen  Post-op Assessment: Report given to RN and Post -op Vital signs reviewed and stable  Post vital signs: Reviewed and stable  Last Vitals:  Vitals Value Taken Time  BP 145/50 02/24/21 2326  Temp    Pulse 104 02/24/21 2333  Resp 17 02/24/21 2333  SpO2 97 % 02/24/21 2333  Vitals shown include unvalidated device data.  Last Pain:  Vitals:   02/24/21 2016  TempSrc:   PainSc: 0-No pain         Complications: No notable events documented.

## 2021-02-25 ENCOUNTER — Encounter (HOSPITAL_COMMUNITY): Payer: Self-pay | Admitting: Orthopedic Surgery

## 2021-02-25 ENCOUNTER — Inpatient Hospital Stay (HOSPITAL_COMMUNITY): Payer: Medicare Other

## 2021-02-25 DIAGNOSIS — S82892A Other fracture of left lower leg, initial encounter for closed fracture: Secondary | ICD-10-CM | POA: Diagnosis not present

## 2021-02-25 LAB — BASIC METABOLIC PANEL
Anion gap: 8 (ref 5–15)
BUN: 26 mg/dL — ABNORMAL HIGH (ref 8–23)
CO2: 22 mmol/L (ref 22–32)
Calcium: 8.2 mg/dL — ABNORMAL LOW (ref 8.9–10.3)
Chloride: 110 mmol/L (ref 98–111)
Creatinine, Ser: 1.35 mg/dL — ABNORMAL HIGH (ref 0.44–1.00)
GFR, Estimated: 38 mL/min — ABNORMAL LOW (ref >60)
Glucose, Bld: 240 mg/dL — ABNORMAL HIGH (ref 70–99)
Potassium: 5 mmol/L (ref 3.5–5.1)
Sodium: 140 mmol/L (ref 135–145)

## 2021-02-25 LAB — CBC
HCT: 26.9 % — ABNORMAL LOW (ref 36.0–46.0)
Hemoglobin: 8.6 g/dL — ABNORMAL LOW (ref 12.0–15.0)
MCH: 29 pg (ref 26.0–34.0)
MCHC: 32 g/dL (ref 30.0–36.0)
MCV: 90.6 fL (ref 80.0–100.0)
Platelets: 189 10*3/uL (ref 150–400)
RBC: 2.97 MIL/uL — ABNORMAL LOW (ref 3.87–5.11)
RDW: 15.9 % — ABNORMAL HIGH (ref 11.5–15.5)
WBC: 12.2 10*3/uL — ABNORMAL HIGH (ref 4.0–10.5)
nRBC: 0 % (ref 0.0–0.2)

## 2021-02-25 LAB — GLUCOSE, CAPILLARY
Glucose-Capillary: 222 mg/dL — ABNORMAL HIGH (ref 70–99)
Glucose-Capillary: 140 mg/dL — ABNORMAL HIGH (ref 70–99)
Glucose-Capillary: 251 mg/dL — ABNORMAL HIGH (ref 70–99)
Glucose-Capillary: 201 mg/dL — ABNORMAL HIGH (ref 70–99)

## 2021-02-25 LAB — CK: Total CK: 385 U/L — ABNORMAL HIGH (ref 38–234)

## 2021-02-25 MED ORDER — DOCUSATE SODIUM 100 MG PO CAPS
100.0000 mg | ORAL_CAPSULE | Freq: Two times a day (BID) | ORAL | Status: DC
  Administered 2021-02-25 (×2): 100 mg via ORAL
  Filled 2021-02-25: qty 1

## 2021-02-25 MED ORDER — MORPHINE SULFATE (PF) 2 MG/ML IV SOLN
2.0000 mg | INTRAVENOUS | Status: DC | PRN
  Administered 2021-02-25 (×2): 2 mg via INTRAVENOUS
  Filled 2021-02-25 (×3): qty 1

## 2021-02-25 MED ORDER — CEFAZOLIN SODIUM-DEXTROSE 2-4 GM/100ML-% IV SOLN: 2.0000 g | INTRAVENOUS | Status: AC

## 2021-02-25 MED ORDER — LEVOTHYROXINE SODIUM 25 MCG PO TABS
125.0000 ug | ORAL_TABLET | Freq: Every day | ORAL | Status: DC
  Administered 2021-02-27 – 2021-03-16 (×17): 125 ug via ORAL
  Filled 2021-02-25 (×17): qty 1

## 2021-02-25 MED ORDER — SODIUM CHLORIDE 0.9 % IV SOLN
2.0000 g | INTRAVENOUS | Status: DC
  Administered 2021-02-25 – 2021-02-26 (×2): 2 g via INTRAVENOUS
  Filled 2021-02-25: qty 2
  Filled 2021-02-25: qty 20
  Filled 2021-02-25: qty 2

## 2021-02-25 MED ORDER — CHLORHEXIDINE GLUCONATE 4 % EX LIQD: 60.0000 mL | Freq: Once | CUTANEOUS | Status: DC

## 2021-02-25 MED ORDER — INSULIN GLARGINE 100 UNIT/ML ~~LOC~~ SOLN
5.0000 [IU] | Freq: Every day | SUBCUTANEOUS | Status: DC
  Administered 2021-02-25: 5 [IU] via SUBCUTANEOUS
  Filled 2021-02-25 (×2): qty 0.05

## 2021-02-25 MED ORDER — ENOXAPARIN SODIUM 30 MG/0.3ML IJ SOSY
30.0000 mg | PREFILLED_SYRINGE | INTRAMUSCULAR | Status: DC
  Administered 2021-02-25 – 2021-02-26 (×2): 30 mg via SUBCUTANEOUS
  Filled 2021-02-25: qty 0.3

## 2021-02-25 MED ORDER — INSULIN ASPART 100 UNIT/ML IJ SOLN
0.0000 [IU] | Freq: Three times a day (TID) | INTRAMUSCULAR | Status: DC
  Administered 2021-02-26 (×2): 2 [IU] via SUBCUTANEOUS
  Administered 2021-02-27: 8 [IU] via SUBCUTANEOUS
  Administered 2021-02-27 (×2): 3 [IU] via SUBCUTANEOUS
  Administered 2021-02-28: 8 [IU] via SUBCUTANEOUS
  Administered 2021-02-28 – 2021-03-01 (×2): 3 [IU] via SUBCUTANEOUS
  Administered 2021-03-01: 5 [IU] via SUBCUTANEOUS
  Administered 2021-03-01: 2 [IU] via SUBCUTANEOUS
  Administered 2021-03-02: 3 [IU] via SUBCUTANEOUS
  Administered 2021-03-02: 2 [IU] via SUBCUTANEOUS
  Administered 2021-03-02: 3 [IU] via SUBCUTANEOUS
  Administered 2021-03-03: 2 [IU] via SUBCUTANEOUS
  Administered 2021-03-03: 3 [IU] via SUBCUTANEOUS
  Administered 2021-03-03: 2 [IU] via SUBCUTANEOUS
  Administered 2021-03-04: 5 [IU] via SUBCUTANEOUS
  Administered 2021-03-04: 2 [IU] via SUBCUTANEOUS
  Administered 2021-03-04: 3 [IU] via SUBCUTANEOUS
  Administered 2021-03-05: 5 [IU] via SUBCUTANEOUS
  Administered 2021-03-05: 3 [IU] via SUBCUTANEOUS
  Administered 2021-03-06: 2 [IU] via SUBCUTANEOUS
  Administered 2021-03-06: 3 [IU] via SUBCUTANEOUS
  Administered 2021-03-06: 5 [IU] via SUBCUTANEOUS
  Administered 2021-03-07: 3 [IU] via SUBCUTANEOUS
  Administered 2021-03-07 – 2021-03-08 (×2): 2 [IU] via SUBCUTANEOUS
  Administered 2021-03-08 (×2): 3 [IU] via SUBCUTANEOUS
  Administered 2021-03-09 (×2): 2 [IU] via SUBCUTANEOUS
  Administered 2021-03-10 – 2021-03-11 (×2): 3 [IU] via SUBCUTANEOUS
  Administered 2021-03-11: 2 [IU] via SUBCUTANEOUS
  Administered 2021-03-12: 5 [IU] via SUBCUTANEOUS
  Administered 2021-03-13: 2 [IU] via SUBCUTANEOUS
  Administered 2021-03-14 – 2021-03-15 (×3): 3 [IU] via SUBCUTANEOUS
  Administered 2021-03-15 – 2021-03-16 (×2): 2 [IU] via SUBCUTANEOUS

## 2021-02-25 MED ORDER — ROSUVASTATIN CALCIUM 5 MG PO TABS
10.0000 mg | ORAL_TABLET | Freq: Every day | ORAL | Status: DC
  Administered 2021-02-27 – 2021-03-16 (×18): 10 mg via ORAL
  Filled 2021-02-25 (×18): qty 2

## 2021-02-25 MED ORDER — AMITRIPTYLINE HCL 10 MG PO TABS
10.0000 mg | ORAL_TABLET | Freq: Every day | ORAL | Status: DC
  Administered 2021-02-25 – 2021-03-15 (×18): 10 mg via ORAL
  Filled 2021-02-25 (×20): qty 1

## 2021-02-25 MED ORDER — ACETAMINOPHEN 325 MG PO TABS
650.0000 mg | ORAL_TABLET | Freq: Four times a day (QID) | ORAL | Status: DC | PRN
  Administered 2021-02-25: 650 mg via ORAL
  Filled 2021-02-25: qty 2

## 2021-02-25 MED ORDER — QUETIAPINE FUMARATE 25 MG PO TABS
25.0000 mg | ORAL_TABLET | Freq: Every day | ORAL | Status: DC
  Administered 2021-02-25 – 2021-03-12 (×15): 25 mg via ORAL
  Filled 2021-02-25 (×16): qty 1

## 2021-02-25 MED ORDER — METOCLOPRAMIDE HCL 5 MG/ML IJ SOLN: 5.0000 mg | Freq: Three times a day (TID) | INTRAMUSCULAR | Status: DC | PRN

## 2021-02-25 MED ORDER — METOPROLOL TARTRATE 5 MG/5ML IV SOLN
2.5000 mg | Freq: Four times a day (QID) | INTRAVENOUS | Status: DC | PRN
  Administered 2021-02-25 – 2021-03-15 (×3): 2.5 mg via INTRAVENOUS
  Filled 2021-02-25 (×4): qty 5

## 2021-02-25 MED ORDER — FENTANYL CITRATE (PF) 100 MCG/2ML IJ SOLN: 25.0000 ug | INTRAMUSCULAR | Status: DC | PRN

## 2021-02-25 MED ORDER — ONDANSETRON HCL 4 MG/2ML IJ SOLN: 4.0000 mg | Freq: Four times a day (QID) | INTRAMUSCULAR | Status: DC | PRN

## 2021-02-25 MED ORDER — POVIDONE-IODINE 10 % EX SWAB
2.0000 "application " | Freq: Once | CUTANEOUS | Status: AC
  Administered 2021-02-26: 2 via TOPICAL

## 2021-02-25 MED ORDER — HYDROMORPHONE HCL 1 MG/ML IJ SOLN
0.5000 mg | INTRAMUSCULAR | Status: DC | PRN
  Administered 2021-02-25: 0.5 mg via INTRAVENOUS
  Filled 2021-02-25: qty 1

## 2021-02-25 MED ORDER — PAROXETINE HCL 10 MG PO TABS
10.0000 mg | ORAL_TABLET | Freq: Every day | ORAL | Status: DC
  Administered 2021-02-27 – 2021-03-16 (×18): 10 mg via ORAL
  Filled 2021-02-25 (×20): qty 1

## 2021-02-25 MED ORDER — OXYCODONE HCL 5 MG PO TABS
5.0000 mg | ORAL_TABLET | ORAL | Status: DC | PRN
Start: 2021-02-25 — End: 2021-02-27

## 2021-02-25 MED ORDER — ONDANSETRON HCL 4 MG PO TABS: 4.0000 mg | ORAL_TABLET | Freq: Four times a day (QID) | ORAL | Status: DC | PRN

## 2021-02-25 MED ORDER — METOPROLOL TARTRATE 5 MG/5ML IV SOLN
INTRAVENOUS | Status: AC
  Filled 2021-02-25: qty 5

## 2021-02-25 MED ORDER — METOCLOPRAMIDE HCL 5 MG PO TABS: 5.0000 mg | ORAL_TABLET | Freq: Three times a day (TID) | ORAL | Status: DC | PRN

## 2021-02-25 MED ORDER — SUCRALFATE 1 G PO TABS
2.0000 g | ORAL_TABLET | Freq: Four times a day (QID) | ORAL | Status: DC
  Administered 2021-02-25 – 2021-03-16 (×68): 2 g via ORAL
  Filled 2021-02-25 (×67): qty 2

## 2021-02-25 NOTE — Progress Notes (Addendum)
[facility] night shift MedSurg coverage note.  The nursing staff reported that the patient is having a low-grade temperature and requested acetaminophen as needed.  Acetaminophen 650 mg every 6 hours PRN ordered.  Tennis Must, MD.  [phone] addendum: The patient's hemoglobin level was 6.6 g/dL.  A PRBC unit transfusion has been ordered.  Tennis Must, MD.
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
## 2021-02-25 NOTE — Anesthesia Postprocedure Evaluation (Signed)
Anesthesia Post Note  Patient: Cassandra [name]  Procedure(s) Performed: IRRIGATION AND DEBRIDEMENT EXTREMITY (Left: Ankle) APPLICATION OF WOUND VAC (Left: Ankle) EXTERNAL FIXATION  ANKLE (Left: Ankle)     Patient location during evaluation: PACU Anesthesia Type: General Level of consciousness: awake Pain management: pain level controlled Vital Signs Assessment: post-procedure vital signs reviewed and stable Respiratory status: spontaneous breathing Cardiovascular status: stable Postop Assessment: no apparent nausea or vomiting Anesthetic complications: no   No notable events documented.  Last Vitals:  Vitals:   02/24/21 2340 02/25/21 0000  BP: (!) 167/54 (!) 162/48  Pulse: 78 74  Resp: 13 14  Temp:  36.6 C  SpO2: 100% 100%    Last Pain:  Vitals:   02/25/21 0000  TempSrc:   PainSc: 0-No pain                 Vienna Folden
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
## 2021-02-25 NOTE — Progress Notes (Signed)
RN, Moishe Spice texted patients MD to inform of patients blood sugar of 236 and to inquire if she wanted [place] to provide coverage for that resukt. No new orders wer given by Dr. Irene Pap at this time. 02/25/2021 @ 0015 Cyndi Bender, RN

## 2021-02-25 NOTE — Progress Notes (Signed)
Subjective: 1 Day Post-Op Procedure(s) (LRB): IRRIGATION AND DEBRIDEMENT EXTREMITY (Left) APPLICATION OF WOUND VAC (Left) EXTERNAL FIXATION  ANKLE (Left) Patient reports pain as mild.   Other than her left ankle injury she does not report any other pain at this point  Objective: Vital signs in last 24 hours: Temp:  [97.6 F (36.4 C)-98.3 F (36.8 C)] 97.6 F (36.4 C) (06/26 0800) Pulse Rate:  [59-92] 76 (06/26 0800) Resp:  [11-22] 16 (06/26 0800) BP: (107-214)/(33-140) 120/54 (06/26 0800) SpO2:  [8 %-100 %] 100 % (06/26 0800) Weight:  [72.6 kg] 72.6 kg (06/25 1656)  Intake/Output from previous day: 06/25 0701 - 06/26 0700 In: 3415.9 [I.V.:2800.9; Blood:315; IV Piggyback:300] Out: 950 [Urine:900; Blood:50] Intake/Output this shift: No intake/output data recorded.  Recent Labs    02/24/21 1643 02/24/21 1657 02/25/21 0119  HGB 9.0* 9.5* 8.6*   Recent Labs    02/24/21 1643 02/24/21 1657 02/25/21 0119  WBC 10.8*  --  12.2*  RBC 3.31*  --  2.97*  HCT 30.1* 28.0* 26.9*  PLT 228  --  189   Recent Labs    02/24/21 1643 02/24/21 1657 02/25/21 0119  NA 138 140 140  K 4.1 4.1 5.0  CL 108 108 110  CO2 22  --  22  BUN 29* 29* 26*  CREATININE 1.49* 1.40* 1.35*  GLUCOSE 252* 245* 240*  CALCIUM 8.4*  --  8.2*   Recent Labs    02/24/21 1643  INR 1.1    Neurovascular intact Incision: dressing C/D/I Compartment soft She is in an ex-fix  Wiggles toes No active drainage   Assessment/Plan: 1 Day Post-Op Procedure(s) (LRB): IRRIGATION AND DEBRIDEMENT EXTREMITY (Left) APPLICATION OF WOUND VAC (Left) EXTERNAL FIXATION  ANKLE (Left)  Plan: Regular diet today Dr Lyla Glassing had reviewed case with Dr Doreatha Martin He is planning on taking her to the OR tomorrow to definitely treat her left ankle injury NPO after midnight     Mauri Pole 02/25/2021, 9:31 AM

## 2021-02-25 NOTE — TOC CAGE-AID Note (Signed)
Transition of Care [facility]) - CAGE-AID Screening   Patient Details  Name: Cassandra [name] MRN: [id number] Date of Birth: Nov 03, 1932  [facility] CM/SW Contact:    Oretha Milch, LCSW Phone Number: 02/25/2021, 10:11 AM   Clinical Narrative: CSW met with patient for CAGE/AID per trauma protocol. CSW met with patient and introduced self and role. Patient reported she feels she never had any problems with drugs or alcohol and her family rarely drinks at all. Patient reports she did have a margerita with lunch but denies any regular drinking or negative impacts from drug and alcohol use. Patient reported if she has to go to rehab she would be open to [facility] but not [facility]. CSW informed patient TOC will support as her medical course progresses.    CAGE-AID Screening:    Have You Ever Felt You Ought to Cut Down on Your Drinking or Drug Use?: No Have People Annoyed You By Critizing Your Drinking Or Drug Use?: No Have You Felt Bad Or Guilty About Your Drinking Or Drug Use?: No Have You Ever Had a Drink or Used Drugs First Thing In The Morning to Steady Your Nerves or to Get Rid of a Hangover?: No CAGE-AID Score: 0  Substance Abuse Education Offered: No
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
## 2021-02-26 ENCOUNTER — Inpatient Hospital Stay (HOSPITAL_COMMUNITY): Payer: Medicare Other

## 2021-02-26 ENCOUNTER — Encounter (HOSPITAL_COMMUNITY)
Admission: EM | Disposition: A | Discharge status: Rehabilitation Facility | Payer: Self-pay | Source: Home / Self Care | Attending: Internal Medicine

## 2021-02-26 ENCOUNTER — Inpatient Hospital Stay (HOSPITAL_COMMUNITY): Payer: Medicare Other | Admitting: Anesthesiology

## 2021-02-26 ENCOUNTER — Encounter (HOSPITAL_COMMUNITY): Payer: Self-pay | Admitting: Internal Medicine

## 2021-02-26 DIAGNOSIS — R7989 Other specified abnormal findings of blood chemistry: Secondary | ICD-10-CM

## 2021-02-26 DIAGNOSIS — R778 Other specified abnormalities of plasma proteins: Secondary | ICD-10-CM | POA: Diagnosis not present

## 2021-02-26 DIAGNOSIS — S82892A Other fracture of left lower leg, initial encounter for closed fracture: Secondary | ICD-10-CM | POA: Diagnosis not present

## 2021-02-26 HISTORY — PX: ORIF ANKLE FRACTURE: SHX5408

## 2021-02-26 HISTORY — PX: EXTERNAL FIXATION REMOVAL: SHX5040

## 2021-02-26 LAB — BLOOD GAS, VENOUS
Acid-Base Excess: 0.1 mmol/L (ref 0.0–2.0)
Bicarbonate: 25.5 mmol/L (ref 20.0–28.0)
Drawn by: 1447
O2 Saturation: 78.5 %
Patient temperature: 37
pCO2, Ven: 51.9 mmHg (ref 44.0–60.0)
pH, Ven: 7.312 (ref 7.250–7.430)
pO2, Ven: 47.5 mmHg — ABNORMAL HIGH (ref 32.0–45.0)

## 2021-02-26 LAB — GLUCOSE, CAPILLARY
Glucose-Capillary: 200 mg/dL — ABNORMAL HIGH (ref 70–99)
Glucose-Capillary: 142 mg/dL — ABNORMAL HIGH (ref 70–99)
Glucose-Capillary: 147 mg/dL — ABNORMAL HIGH (ref 70–99)
Glucose-Capillary: 236 mg/dL — ABNORMAL HIGH (ref 70–99)
Glucose-Capillary: 144 mg/dL — ABNORMAL HIGH (ref 70–99)
Glucose-Capillary: 126 mg/dL — ABNORMAL HIGH (ref 70–99)
Glucose-Capillary: 190 mg/dL — ABNORMAL HIGH (ref 70–99)
Glucose-Capillary: 313 mg/dL — ABNORMAL HIGH (ref 70–99)

## 2021-02-26 LAB — CBC
HCT: 21 % — ABNORMAL LOW (ref 36.0–46.0)
HCT: 22 % — ABNORMAL LOW (ref 36.0–46.0)
Hemoglobin: 6.6 g/dL — CL (ref 12.0–15.0)
Hemoglobin: 7.3 g/dL — ABNORMAL LOW (ref 12.0–15.0)
MCH: 27.8 pg (ref 26.0–34.0)
MCH: 29.6 pg (ref 26.0–34.0)
MCHC: 31.4 g/dL (ref 30.0–36.0)
MCHC: 33.2 g/dL (ref 30.0–36.0)
MCV: 88.6 fL (ref 80.0–100.0)
MCV: 89.1 fL (ref 80.0–100.0)
Platelets: 142 10*3/uL — ABNORMAL LOW (ref 150–400)
Platelets: 128 10*3/uL — ABNORMAL LOW (ref 150–400)
RBC: 2.37 MIL/uL — ABNORMAL LOW (ref 3.87–5.11)
RBC: 2.47 MIL/uL — ABNORMAL LOW (ref 3.87–5.11)
RDW: 16.6 % — ABNORMAL HIGH (ref 11.5–15.5)
RDW: 15.9 % — ABNORMAL HIGH (ref 11.5–15.5)
WBC: 10.7 10*3/uL — ABNORMAL HIGH (ref 4.0–10.5)
WBC: 10.5 10*3/uL (ref 4.0–10.5)
nRBC: 0 % (ref 0.0–0.2)
nRBC: 0 % (ref 0.0–0.2)

## 2021-02-26 LAB — COMPREHENSIVE METABOLIC PANEL
ALT: 11 U/L (ref 0–44)
AST: 22 U/L (ref 15–41)
Albumin: 2.3 g/dL — ABNORMAL LOW (ref 3.5–5.0)
Alkaline Phosphatase: 45 U/L (ref 38–126)
Anion gap: 6 (ref 5–15)
BUN: 16 mg/dL (ref 8–23)
CO2: 26 mmol/L (ref 22–32)
Calcium: 8 mg/dL — ABNORMAL LOW (ref 8.9–10.3)
Chloride: 103 mmol/L (ref 98–111)
Creatinine, Ser: 1.17 mg/dL — ABNORMAL HIGH (ref 0.44–1.00)
GFR, Estimated: 45 mL/min — ABNORMAL LOW (ref >60)
Glucose, Bld: 346 mg/dL — ABNORMAL HIGH (ref 70–99)
Potassium: 4.4 mmol/L (ref 3.5–5.1)
Sodium: 135 mmol/L (ref 135–145)
Total Bilirubin: 0.6 mg/dL (ref 0.3–1.2)
Total Protein: 4.9 g/dL — ABNORMAL LOW (ref 6.5–8.1)

## 2021-02-26 LAB — BASIC METABOLIC PANEL
Anion gap: 7 (ref 5–15)
BUN: 16 mg/dL (ref 8–23)
CO2: 26 mmol/L (ref 22–32)
Calcium: 8.3 mg/dL — ABNORMAL LOW (ref 8.9–10.3)
Chloride: 103 mmol/L (ref 98–111)
Creatinine, Ser: 1.05 mg/dL — ABNORMAL HIGH (ref 0.44–1.00)
GFR, Estimated: 52 mL/min — ABNORMAL LOW (ref >60)
Glucose, Bld: 181 mg/dL — ABNORMAL HIGH (ref 70–99)
Potassium: 3.8 mmol/L (ref 3.5–5.1)
Sodium: 136 mmol/L (ref 135–145)

## 2021-02-26 LAB — PREPARE RBC (CROSSMATCH)

## 2021-02-26 LAB — PLATELET MAPPING ADP AND AA
AA Aggregation: 77 % — ABNORMAL LOW (ref 89–100)
AA Inhibition: 23 % — ABNORMAL HIGH (ref 0–11)
ADP Aggregation: 80.5 % — ABNORMAL LOW (ref 83–100)
ADP Inhibition: 19.5 % — ABNORMAL HIGH (ref 0–17)
Activator F: 25.2 mm — ABNORMAL HIGH (ref 2–19)
Adenosine 5 Diphosphate (MA): 59.9 mm (ref 45–69)
Arachidonic Acid (MA): 58.4 mm (ref 51–71)
Kaolin with Heparinase: 68.3 mm — ABNORMAL HIGH (ref 53–68)

## 2021-02-26 LAB — MAGNESIUM: Magnesium: 1.6 mg/dL — ABNORMAL LOW (ref 1.7–2.4)

## 2021-02-26 LAB — LACTIC ACID, PLASMA: Lactic Acid, Venous: 1.2 mmol/L (ref 0.5–1.9)

## 2021-02-26 LAB — PROTIME-INR
INR: 1.2 (ref 0.8–1.2)
Prothrombin Time: 15.6 seconds — ABNORMAL HIGH (ref 11.4–15.2)

## 2021-02-26 LAB — HEMOGLOBIN A1C
Hgb A1c MFr Bld: 7.7 % — ABNORMAL HIGH (ref 4.8–5.6)
Mean Plasma Glucose: 174 mg/dL

## 2021-02-26 LAB — VITAMIN D 25 HYDROXY (VIT D DEFICIENCY, FRACTURES): Vit D, 25-Hydroxy: 25.81 ng/mL — ABNORMAL LOW (ref 30–100)

## 2021-02-26 LAB — TROPONIN I (HIGH SENSITIVITY): Troponin I (High Sensitivity): 784 ng/L (ref <18)

## 2021-02-26 SURGERY — OPEN REDUCTION INTERNAL FIXATION (ORIF) ANKLE FRACTURE
Anesthesia: General | Site: Ankle | Laterality: Left

## 2021-02-26 MED ORDER — POLYETHYLENE GLYCOL 3350 17 G PO PACK: 17.0000 g | PACK | Freq: Every day | ORAL | Status: DC | PRN

## 2021-02-26 MED ORDER — LACTATED RINGERS IV SOLN: INTRAVENOUS | Status: DC

## 2021-02-26 MED ORDER — LIDOCAINE 2% (20 MG/ML) 5 ML SYRINGE
INTRAMUSCULAR | Status: DC | PRN
  Administered 2021-02-26: 60 mg via INTRAVENOUS

## 2021-02-26 MED ORDER — MIDODRINE HCL 5 MG PO TABS
10.0000 mg | ORAL_TABLET | Freq: Once | ORAL | Status: AC
  Administered 2021-02-26: 10 mg via ORAL
  Filled 2021-02-26: qty 2

## 2021-02-26 MED ORDER — ORAL CARE MOUTH RINSE
15.0000 mL | Freq: Once | OROMUCOSAL | Status: AC
Start: 2021-02-26 — End: 2021-02-26

## 2021-02-26 MED ORDER — PROPOFOL 10 MG/ML IV BOLUS
INTRAVENOUS | Status: AC
  Filled 2021-02-26: qty 20

## 2021-02-26 MED ORDER — ACETAMINOPHEN 325 MG PO TABS: 325.0000 mg | ORAL_TABLET | Freq: Four times a day (QID) | ORAL | Status: DC | PRN

## 2021-02-26 MED ORDER — KETOROLAC TROMETHAMINE 15 MG/ML IJ SOLN
INTRAMUSCULAR | Status: DC | PRN
  Administered 2021-02-26: 15 mg via INTRAVENOUS

## 2021-02-26 MED ORDER — MIDAZOLAM HCL 2 MG/2ML IJ SOLN
INTRAMUSCULAR | Status: AC
  Filled 2021-02-26: qty 2

## 2021-02-26 MED ORDER — 0.9 % SODIUM CHLORIDE (POUR BTL) OPTIME
TOPICAL | Status: DC | PRN
  Administered 2021-02-26: 1000 mL

## 2021-02-26 MED ORDER — HYDROMORPHONE HCL 1 MG/ML IJ SOLN: 0.2500 mg | INTRAMUSCULAR | Status: DC | PRN

## 2021-02-26 MED ORDER — ONDANSETRON HCL 4 MG/2ML IJ SOLN
INTRAMUSCULAR | Status: AC
  Filled 2021-02-26: qty 2

## 2021-02-26 MED ORDER — LACTATED RINGERS IV BOLUS
1000.0000 mL | Freq: Once | INTRAVENOUS | Status: AC
  Administered 2021-02-26: 1000 mL via INTRAVENOUS

## 2021-02-26 MED ORDER — MAGNESIUM SULFATE 2 GM/50ML IV SOLN
2.0000 g | Freq: Once | INTRAVENOUS | Status: AC
  Administered 2021-02-26: 2 g via INTRAVENOUS
  Filled 2021-02-26: qty 50

## 2021-02-26 MED ORDER — HYDROCODONE-ACETAMINOPHEN 5-325 MG PO TABS: 1.0000 | ORAL_TABLET | ORAL | Status: DC | PRN

## 2021-02-26 MED ORDER — HYDROCODONE-ACETAMINOPHEN 7.5-325 MG PO TABS
1.0000 | ORAL_TABLET | ORAL | Status: DC | PRN
  Administered 2021-02-27 – 2021-02-28 (×2): 2 via ORAL
  Filled 2021-02-26 (×2): qty 2

## 2021-02-26 MED ORDER — SODIUM CHLORIDE 0.9% IV SOLUTION: Freq: Once | INTRAVENOUS | Status: DC

## 2021-02-26 MED ORDER — FENTANYL CITRATE (PF) 250 MCG/5ML IJ SOLN
INTRAMUSCULAR | Status: AC
  Filled 2021-02-26: qty 5

## 2021-02-26 MED ORDER — DOCUSATE SODIUM 100 MG PO CAPS
100.0000 mg | ORAL_CAPSULE | Freq: Two times a day (BID) | ORAL | Status: DC
  Administered 2021-02-27 – 2021-03-15 (×27): 100 mg via ORAL
  Filled 2021-02-26 (×28): qty 1

## 2021-02-26 MED ORDER — PHENYLEPHRINE HCL-NACL 10-0.9 MG/250ML-% IV SOLN
INTRAVENOUS | Status: DC | PRN
  Administered 2021-02-26: 50 ug/min via INTRAVENOUS

## 2021-02-26 MED ORDER — SODIUM CHLORIDE 0.9 % IV SOLN
2.0000 g | INTRAVENOUS | Status: AC
  Administered 2021-02-26 – 2021-02-28 (×3): 2 g via INTRAVENOUS
  Filled 2021-02-26: qty 2
  Filled 2021-02-26: qty 20
  Filled 2021-02-26: qty 2

## 2021-02-26 MED ORDER — VANCOMYCIN HCL 1000 MG IV SOLR
INTRAVENOUS | Status: AC
  Filled 2021-02-26: qty 1000

## 2021-02-26 MED ORDER — DEXAMETHASONE SODIUM PHOSPHATE 10 MG/ML IJ SOLN
INTRAMUSCULAR | Status: AC
  Filled 2021-02-26: qty 1

## 2021-02-26 MED ORDER — PROPOFOL 10 MG/ML IV BOLUS
INTRAVENOUS | Status: DC | PRN
  Administered 2021-02-26: 60 mg via INTRAVENOUS

## 2021-02-26 MED ORDER — FENTANYL CITRATE (PF) 100 MCG/2ML IJ SOLN
50.0000 ug | Freq: Once | INTRAMUSCULAR | Status: AC
Start: 2021-02-26 — End: 2021-02-26
  Filled 2021-02-26: qty 1

## 2021-02-26 MED ORDER — ONDANSETRON HCL 4 MG/2ML IJ SOLN: 4.0000 mg | Freq: Once | INTRAMUSCULAR | Status: DC | PRN

## 2021-02-26 MED ORDER — ONDANSETRON HCL 4 MG/2ML IJ SOLN
INTRAMUSCULAR | Status: DC | PRN
  Administered 2021-02-26: 4 mg via INTRAVENOUS

## 2021-02-26 MED ORDER — ACETAMINOPHEN 10 MG/ML IV SOLN: 1000.0000 mg | Freq: Once | INTRAVENOUS | Status: DC | PRN

## 2021-02-26 MED ORDER — CHLORHEXIDINE GLUCONATE 0.12 % MT SOLN
OROMUCOSAL | Status: AC
  Administered 2021-02-26: 15 mL via OROMUCOSAL
  Filled 2021-02-26: qty 15

## 2021-02-26 MED ORDER — ROPIVACAINE HCL 5 MG/ML IJ SOLN
INTRAMUSCULAR | Status: DC | PRN
  Administered 2021-02-26: 30 mL via PERINEURAL

## 2021-02-26 MED ORDER — CEFAZOLIN SODIUM-DEXTROSE 2-3 GM-%(50ML) IV SOLR
INTRAVENOUS | Status: DC | PRN
  Administered 2021-02-26: 2 g via INTRAVENOUS

## 2021-02-26 MED ORDER — VANCOMYCIN HCL 1000 MG IV SOLR
INTRAVENOUS | Status: DC | PRN
  Administered 2021-02-26: 1000 mg via TOPICAL

## 2021-02-26 MED ORDER — FENTANYL CITRATE (PF) 100 MCG/2ML IJ SOLN
INTRAMUSCULAR | Status: AC
  Administered 2021-02-26: 50 ug via INTRAVENOUS
  Filled 2021-02-26: qty 2

## 2021-02-26 MED ORDER — ROCURONIUM BROMIDE 10 MG/ML (PF) SYRINGE
PREFILLED_SYRINGE | INTRAVENOUS | Status: DC | PRN
  Administered 2021-02-26: 70 mg via INTRAVENOUS

## 2021-02-26 MED ORDER — CHLORHEXIDINE GLUCONATE 0.12 % MT SOLN: 15.0000 mL | Freq: Once | OROMUCOSAL | Status: AC

## 2021-02-26 MED ORDER — SUGAMMADEX SODIUM 200 MG/2ML IV SOLN
INTRAVENOUS | Status: DC | PRN
  Administered 2021-02-26: 150 mg via INTRAVENOUS

## 2021-02-26 MED ORDER — MORPHINE SULFATE (PF) 2 MG/ML IV SOLN: 0.5000 mg | INTRAVENOUS | Status: DC | PRN

## 2021-02-26 MED ORDER — SODIUM CHLORIDE 0.9 % IR SOLN
Status: DC | PRN
  Administered 2021-02-26: 3000 mL

## 2021-02-26 MED ORDER — CEFAZOLIN SODIUM-DEXTROSE 2-4 GM/100ML-% IV SOLN
INTRAVENOUS | Status: AC
  Filled 2021-02-26: qty 100

## 2021-02-26 MED ORDER — SODIUM CHLORIDE 0.9 % IV SOLN: INTRAVENOUS | Status: DC

## 2021-02-26 MED ORDER — DEXAMETHASONE SODIUM PHOSPHATE 10 MG/ML IJ SOLN
INTRAMUSCULAR | Status: DC | PRN
  Administered 2021-02-26: 4 mg via INTRAVENOUS

## 2021-02-26 SURGICAL SUPPLY — 78 items
APL PRP STRL LF DISP 70% ISPRP (MISCELLANEOUS) ×2
BANDAGE ESMARK 6X9 LF (GAUZE/BANDAGES/DRESSINGS) ×2 IMPLANT
BIT DRILL QC 2.0 SHORT EVOS SM (DRILL) ×2 IMPLANT
BIT DRILL QC 2.5MM SHRT EVO SM (DRILL) ×2 IMPLANT
BNDG CMPR 9X6 STRL LF SNTH (GAUZE/BANDAGES/DRESSINGS) ×2
BNDG COHESIVE 4X5 TAN STRL (GAUZE/BANDAGES/DRESSINGS) ×3 IMPLANT
BNDG ELASTIC 4X5.8 VLCR STR LF (GAUZE/BANDAGES/DRESSINGS) ×3 IMPLANT
BNDG ELASTIC 6X5.8 VLCR STR LF (GAUZE/BANDAGES/DRESSINGS) ×3 IMPLANT
BNDG ESMARK 6X9 LF (GAUZE/BANDAGES/DRESSINGS) ×3
BNDG GAUZE ELAST 4 BULKY (GAUZE/BANDAGES/DRESSINGS) ×6 IMPLANT
BRUSH SCRUB EZ PLAIN DRY (MISCELLANEOUS) ×6 IMPLANT
CHLORAPREP W/TINT 26 (MISCELLANEOUS) ×3 IMPLANT
COVER SURGICAL LIGHT HANDLE (MISCELLANEOUS) ×6 IMPLANT
COVER WAND RF STERILE (DRAPES) ×3 IMPLANT
DRAPE C-ARM 42X72 X-RAY (DRAPES) ×3 IMPLANT
DRAPE C-ARMOR (DRAPES) ×3 IMPLANT
DRAPE ORTHO SPLIT 77X108 STRL (DRAPES) ×6
DRAPE SURG ORHT 6 SPLT 77X108 (DRAPES) ×4 IMPLANT
DRAPE U-SHAPE 47X51 STRL (DRAPES) ×3 IMPLANT
DRILL QC 2.0 SHORT EVOS SM (DRILL) ×3
DRILL QC 2.5MM SHORT EVOS SM (DRILL) ×3
DRSG ADAPTIC 3X8 NADH LF (GAUZE/BANDAGES/DRESSINGS) ×3 IMPLANT
DRSG MEPITEL 4X7.2 (GAUZE/BANDAGES/DRESSINGS) ×3 IMPLANT
ELECT REM PT RETURN 9FT ADLT (ELECTROSURGICAL) ×3
ELECTRODE REM PT RTRN 9FT ADLT (ELECTROSURGICAL) ×2 IMPLANT
GAUZE SPONGE 4X4 12PLY STRL (GAUZE/BANDAGES/DRESSINGS) ×3 IMPLANT
GAUZE SPONGE 4X4 12PLY STRL LF (GAUZE/BANDAGES/DRESSINGS) ×3 IMPLANT
GLOVE SURG ENC MOIS LTX SZ6.5 (GLOVE) ×9 IMPLANT
GLOVE SURG ENC MOIS LTX SZ7.5 (GLOVE) ×12 IMPLANT
GLOVE SURG UNDER POLY LF SZ6.5 (GLOVE) ×3 IMPLANT
GLOVE SURG UNDER POLY LF SZ7.5 (GLOVE) ×3 IMPLANT
GOWN STRL REUS W/ TWL LRG LVL3 (GOWN DISPOSABLE) ×4 IMPLANT
GOWN STRL REUS W/TWL LRG LVL3 (GOWN DISPOSABLE) ×6
K-WIRE 1.25 (WIRE) ×3
K-WIRE 1.6 (WIRE) ×3
K-WIRE FX150X1.6XTROC PNT (WIRE) ×2
KIT BASIN OR (CUSTOM PROCEDURE TRAY) ×3 IMPLANT
KIT TURNOVER KIT B (KITS) ×3 IMPLANT
KWIRE FX150X1.6XTROC PNT (WIRE) ×2 IMPLANT
MANIFOLD NEPTUNE II (INSTRUMENTS) ×3 IMPLANT
NEEDLE HYPO 21X1.5 SAFETY (NEEDLE) IMPLANT
NEEDLE HYPO 25GX1X1/2 BEV (NEEDLE) ×3 IMPLANT
NS IRRIG 1000ML POUR BTL (IV SOLUTION) ×3 IMPLANT
PACK TOTAL JOINT (CUSTOM PROCEDURE TRAY) ×3 IMPLANT
PAD ARMBOARD 7.5X6 YLW CONV (MISCELLANEOUS) ×6 IMPLANT
PAD CAST 3X4 CTTN HI CHSV (CAST SUPPLIES) ×2 IMPLANT
PAD CAST 4YDX4 CTTN HI CHSV (CAST SUPPLIES) IMPLANT
PADDING CAST COTTON 3X4 STRL (CAST SUPPLIES) ×3
PADDING CAST COTTON 4X4 STRL (CAST SUPPLIES)
PADDING CAST COTTON 6X4 STRL (CAST SUPPLIES) ×9 IMPLANT
PLATE FIB EVOS 2.7X125 9H LT (Plate) ×3 IMPLANT
SCREW CORT 2.7X15 T8 ST EVOS (Screw) ×3 IMPLANT
SCREW CORT 2.7X16 STAR T8 EVOS (Screw) ×9 IMPLANT
SCREW CORT 2.7X17 T8 ST EVOS (Screw) ×6 IMPLANT
SCREW CORT 2.7X18 T8 ST EVOS (Screw) ×3 IMPLANT
SCREW CORT EVOS ST 3.5X12 (Screw) ×9 IMPLANT
SCREW CTX 3.5X50MM EVOS (Screw) ×3 IMPLANT
SCREW CTX ST EVOS 3.5X40 (Screw) ×3 IMPLANT
SPLINT PLASTER CAST XFAST 5X30 (CAST SUPPLIES) ×2 IMPLANT
SPLINT PLASTER XFAST SET 5X30 (CAST SUPPLIES) ×1
SPONGE LAP 18X18 RF (DISPOSABLE) ×3 IMPLANT
STAPLER VISISTAT 35W (STAPLE) ×3 IMPLANT
SUCTION FRAZIER HANDLE 10FR (MISCELLANEOUS) ×3
SUCTION TUBE FRAZIER 10FR DISP (MISCELLANEOUS) ×2 IMPLANT
SUT ETHILON 3 0 PS 1 (SUTURE) ×6 IMPLANT
SUT MNCRL AB 3-0 PS2 18 (SUTURE) ×3 IMPLANT
SUT MON AB 2-0 CT1 36 (SUTURE) ×3 IMPLANT
SUT PROLENE 0 CT (SUTURE) IMPLANT
SUT VIC AB 0 CT1 27 (SUTURE) ×3
SUT VIC AB 0 CT1 27XBRD ANBCTR (SUTURE) ×2 IMPLANT
SUT VIC AB 2-0 CT1 27 (SUTURE) ×6
SUT VIC AB 2-0 CT1 TAPERPNT 27 (SUTURE) ×4 IMPLANT
SYR CONTROL 10ML LL (SYRINGE) ×3 IMPLANT
TOWEL GREEN STERILE (TOWEL DISPOSABLE) ×6 IMPLANT
TOWEL GREEN STERILE FF (TOWEL DISPOSABLE) ×6 IMPLANT
UNDERPAD 30X36 HEAVY ABSORB (UNDERPADS AND DIAPERS) ×3 IMPLANT
WATER STERILE IRR 1000ML POUR (IV SOLUTION) ×6 IMPLANT
WIRE FX150X1.25XTROC PNT (WIRE) ×2 IMPLANT

## 2021-02-26 NOTE — Transfer of Care (Signed)
Immediate Anesthesia Transfer of Care Note  Patient: Cassandra [name]  Procedure(s) Performed: OPEN REDUCTION INTERNAL FIXATION (ORIF) ANKLE FRACTURE (Left: Ankle) REMOVAL EXTERNAL FIXATION LEG (Left)  Patient Location: PACU  Anesthesia Type:General  Level of Consciousness: awake, alert , oriented and patient cooperative  Airway & Oxygen Therapy: Patient Spontanous Breathing and Patient connected to nasal cannula oxygen  Post-op Assessment: Report given to RN and Post -op Vital signs reviewed and stable  Post vital signs: Reviewed and stable  Last Vitals:  Vitals Value Taken Time  BP 171/44 02/26/21 1658  Temp    Pulse 84 02/26/21 1708  Resp 28 02/26/21 1708  SpO2 100 % 02/26/21 1708  Vitals shown include unvalidated device data.  Last Pain:  Vitals:   02/26/21 1413  TempSrc:   PainSc: 1       Patients Stated Pain Goal: 0 (53/97/67 3419)  Complications: No notable events documented.
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
## 2021-02-26 NOTE — Progress Notes (Signed)
[facility] night shift MedSurg coverage note.  The patient was seen earlier due to somnolence after returning from the OR.  She developed diaphoresis and has had wide pulse pressure with a normal SBP, but the DBP often in the 30s and 40s.  The rest of her vital signs were stable.  General: Somnolent, but in NAD. HEENT: PERRLA, oral mucosa and lips are very dry. Neck: Supple, no JVD. Lungs: Decreased breath sounds in bases, otherwise CTA. Cardiovascular: S1-S2, RRR, no murmur. Abdomen: Soft, nontender Extremities: Hematoma and ecchymosis on left shoulder/brachial area. Musculoskeletal: LLE immobilization and dressing in place. Neuro: Grossly nonfocal  General: Somnolent, but easily awakened.  Oriented x3 with some difficulty to remember time/date.  Troponin I (High Sensitivity) [[id number]] (Abnormal)   Collected: 02/26/21 2128   Updated: 02/26/21 2258    Troponin I (High Sensitivity) 784 High Panic   ng/L  Comprehensive metabolic panel [[id number]] (Abnormal)   Collected: 02/26/21 2128   Updated: 02/26/21 2228   Specimen Type: Blood    Sodium 135 mmol/L   Potassium 4.4 mmol/L   Chloride 103 mmol/L   CO2 26 mmol/L   Glucose, Bld 346 High  mg/dL   BUN 16 mg/dL   Creatinine, Ser 1.17 High  mg/dL   Calcium 8.0 Low  mg/dL   Total Protein 4.9 Low  g/dL   Albumin 2.3 Low  g/dL   AST 22 U/L   ALT 11 U/L   Alkaline Phosphatase 45 U/L   Total Bilirubin 0.6 mg/dL   GFR, Estimated 45 Low  mL/min   Anion gap 6  Lactic acid, plasma [[id number]]   Collected: 02/26/21 2128   Updated: 02/26/21 2227   Specimen Type: Blood    Lactic Acid, Venous 1.2 mmol/L  Protime-INR [[id number]] (Abnormal)   Collected: 02/26/21 2128   Updated: 02/26/21 2205   Specimen Type: Blood    Prothrombin Time 15.6 High  seconds   INR 1.2  CBC [[id number]] (Abnormal)   Collected: 02/26/21 2128   Updated: 02/26/21 2203   Specimen Type: Blood    WBC 10.5 K/uL   RBC 2.47 Low  MIL/uL   Hemoglobin 7.3 Low  g/dL   HCT  22.0 Low  %   MCV 89.1 fL   MCH 29.6 pg   MCHC 33.2 g/dL   RDW 15.9 High  %   Platelets 128 Low  K/uL   nRBC 0.0 %  Blood gas, venous [[id number]] (Abnormal)   Collected: 02/26/21 2136   Updated: 02/26/21 2155   Specimen Type: Blood    pH, Ven 7.312   pCO2, Ven 51.9 mmHg   pO2, Ven 47.5 High  mmHg   Bicarbonate 25.5 mmol/L   Acid-Base Excess 0.1 mmol/L   O2 Saturation 78.5 %   Patient temperature 37.0   Collection site VENOUS   Drawn by 1224   Sample type VEIN   Assessment: Postoperative diaphoresis and somnolence. Elevated troponin level Acute blood loss anemia  Plan: Transfer to PCU Echocardiogram in a.m. Trend troponin level. Cardiology consult appreciated. Mental status closer to baseline now. Monitor hematocrit and hemoglobin. Transfuse as needed. Postop care per orthopedic surgery.  Tennis Must, MD.  About 55 minutes of critical care time were spent during the process of this emergent event.  This document was prepared using Dragon voice recognition software and may contain some unintended transcription errors.
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
## 2021-02-26 NOTE — Significant Event (Signed)
Rapid Response Event Note   Reason for Call :  Lethargy  Pt had L ankle ORIF today. She was admitted to 4E27 at 1815 today.   Initial Focused Assessment:  Pt lying in bed with eyes closed. She will awaken to repeated verbal stimulation, is alert and oriented, follow commands, and moves all extremities equally(her LLE is in a cast so pt only asked to wiggles her toes on the L). Pt denies chest pain/SOB. Pt repeatedly falls asleep while talking to RN. Lungs clear, diminished in the bases. Skin cool and diaphoretic.   T-98, HR-68, BP-123/37, RR-16, SpO2-100%  on 2L [place], CBG-313  Interventions:  EKG-A-V paced  CBC, CMP, PT/INR, Trop, LA, VBG PCXR 1L LR bolus Plan of Care:  Give bolus and await labs and PCXR results. Continue to monitor pt closely. Call RRT if further assistance needed.   Event Summary:   MD Notified: Dr. Olevia Bowens notified and came to bedside  Call [place], Remedios Mckone Anderson, RN

## 2021-02-26 NOTE — ED Provider Notes (Signed)
.  Sedation  Date/Time: 02/26/2021 11:58 AM Performed by: Lucrezia Starch, MD Authorized by: Lucrezia Starch, MD   Consent:    Consent obtained:  Written and verbal   Consent given by:  Patient   Risks discussed:  Allergic reaction, dysrhythmia, inadequate sedation, respiratory compromise necessitating ventilatory assistance and intubation, prolonged sedation necessitating reversal, prolonged hypoxia resulting in organ damage, nausea and vomiting   Alternatives discussed:  Analgesia without sedation, anxiolysis and regional anesthesia Universal protocol:    Immediately prior to procedure, a time out was called: yes   Indications:    Procedure performed:  Fracture reduction (fx and dislocation)   Procedure necessitating sedation performed by:  Physician performing sedation Pre-sedation assessment:    Time since last food or drink:  Greater than 4 hours   ASA classification: class 2 - patient with mild systemic disease     Mouth opening:  3 or more finger widths   Mallampati score:  II - soft palate, uvula, fauces visible   Neck mobility: normal     Pre-sedation assessments completed and reviewed: airway patency, cardiovascular function, hydration status, mental status, nausea/vomiting and pain level   Immediate pre-procedure details:    Reviewed: vital signs, relevant labs/tests and NPO status     Verified: bag valve mask available, emergency equipment available, intubation equipment available, IV patency confirmed, oxygen available, reversal medications available and suction available   Procedure details (see MAR for exact dosages):    Preoxygenation:  Nasal cannula   Sedation:  Propofol   Intended level of sedation: deep   Intra-procedure events: none     Intra-procedure management:  Airway repositioning and supplemental oxygen   Total Provider sedation time (minutes):  13 Post-procedure details:    Attendance: Constant attendance by certified staff until patient recovered      Recovery: Patient returned to pre-procedure baseline     Post-sedation assessments completed and reviewed: airway patency, cardiovascular function, hydration status, mental status, nausea/vomiting, pain level, respiratory function and temperature     Patient is stable for discharge or admission: yes     Procedure completion:  Tolerated well, no immediate complications Comments:     Patient tolerated procedure well.  Please note patient only required fairly small dose of propofol to achieve adequate sedation (~40mg  or 0.5mg /kg). Did provide another couple small doses to maintain appropriate sedation. Tolerated well. No complications with sedation.     Lucrezia Starch, MD 02/26/21 [phone]

## 2021-02-26 NOTE — Progress Notes (Signed)
Orthopedic Tech Progress Note Patient Details:  [name] 16-Mar-1933 [id number] RN called requesting a TRAPEZE for patient per order, due to patient age she's isn't able to have frame.  Patient ID: Cassandra [name], female   DOB: Dec 04, 1932, 87 y.o.   MRN: [id number]  Janit Pagan 02/26/2021, 6:23 PM

## 2021-02-26 NOTE — Anesthesia Procedure Notes (Signed)
Procedure Name: Intubation Date/Time: 02/26/2021 3:12 PM Performed by: Reeves Dam, CRNA Pre-anesthesia Checklist: Patient identified, Patient being monitored, Emergency Drugs available, Timeout performed and Suction available Patient Re-evaluated:Patient Re-evaluated prior to induction Oxygen Delivery Method: Circle System Utilized Preoxygenation: Pre-oxygenation with 100% oxygen Induction Type: IV induction Ventilation: Mask ventilation without difficulty Laryngoscope Size: 3 and Miller Grade View: Grade I Tube type: Oral Tube size: 7.0 mm Number of attempts: 1 Airway Equipment and Method: stylet and Stylet Placement Confirmation: positive ETCO2 and breath sounds checked- equal and bilateral Secured at: 21 cm Tube secured with: Tape Dental Injury: Teeth and Oropharynx as per pre-operative assessment
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
## 2021-02-26 NOTE — Anesthesia Preprocedure Evaluation (Signed)
Anesthesia Evaluation  Patient identified by MRN, date of birth, ID band Patient awake    Reviewed: Allergy & Precautions, NPO status , Patient's Chart, lab work & pertinent test results  Airway Mallampati: II  TM Distance: >3 FB Neck ROM: Full    Dental no notable dental hx.    Pulmonary sleep apnea ,    Pulmonary exam normal breath sounds clear to auscultation       Cardiovascular hypertension, + CAD  Normal cardiovascular exam Rhythm:Regular Rate:Normal     Neuro/Psych negative neurological ROS  negative psych ROS   GI/Hepatic negative GI ROS, Neg liver ROS,   Endo/Other  diabetesHypothyroidism   Renal/GU negative Renal ROS  negative genitourinary   Musculoskeletal negative musculoskeletal ROS (+)   Abdominal   Peds negative pediatric ROS (+)  Hematology  (+) anemia ,   Anesthesia Other Findings   Reproductive/Obstetrics negative OB ROS                             Anesthesia Physical Anesthesia Plan  ASA: 3  Anesthesia Plan: General   Post-op Pain Management:    Induction: Intravenous  PONV Risk Score and Plan: 3 and Ondansetron, Dexamethasone and Treatment may vary due to age or medical condition  Airway Management Planned: Oral ETT  Additional Equipment:   Intra-op Plan:   Post-operative Plan: Extubation in OR  Informed Consent: I have reviewed the patients History and Physical, chart, labs and discussed the procedure including the risks, benefits and alternatives for the proposed anesthesia with the patient or authorized representative who has indicated his/her understanding and acceptance.     Dental advisory given  Plan Discussed with: CRNA and Surgeon  Anesthesia Plan Comments:         Anesthesia Quick Evaluation

## 2021-02-26 NOTE — Anesthesia Procedure Notes (Signed)
Anesthesia Regional Block: Popliteal block   Pre-Anesthetic Checklist: , timeout performed,  Correct Patient, Correct Site, Correct Laterality,  Correct Procedure, Correct Position, site marked,  Risks and benefits discussed,  Surgical consent,  Pre-op evaluation,  At surgeon's request and post-op pain management  Laterality: Left  Prep: chloraprep       Needles:  Injection technique: Single-shot  Needle Type: Echogenic Needle     Needle Length: 9cm      Additional Needles:   Procedures:,,,, ultrasound used (permanent image in chart),,    Narrative:  Start time: 02/26/2021 2:35 PM End time: 02/26/2021 2:44 PM Injection made incrementally with aspirations every 5 mL.  Performed by: Personally  Anesthesiologist: Myrtie Soman, MD  Additional Notes: Patient tolerated the procedure well without complications

## 2021-02-26 NOTE — Anesthesia Procedure Notes (Signed)
Anesthesia Procedure Image    

## 2021-02-26 NOTE — Plan of Care (Signed)
  Problem: Education: Goal: Knowledge of General Education information will improve Description Including pain rating scale, medication(s)/side effects and non-pharmacologic comfort measures Outcome: Progressing   

## 2021-02-27 ENCOUNTER — Encounter (HOSPITAL_COMMUNITY): Payer: Self-pay | Admitting: Internal Medicine

## 2021-02-27 ENCOUNTER — Inpatient Hospital Stay (HOSPITAL_COMMUNITY): Payer: Medicare Other

## 2021-02-27 DIAGNOSIS — S82892A Other fracture of left lower leg, initial encounter for closed fracture: Secondary | ICD-10-CM | POA: Diagnosis not present

## 2021-02-27 DIAGNOSIS — I2510 Atherosclerotic heart disease of native coronary artery without angina pectoris: Secondary | ICD-10-CM | POA: Diagnosis not present

## 2021-02-27 DIAGNOSIS — R778 Other specified abnormalities of plasma proteins: Secondary | ICD-10-CM

## 2021-02-27 DIAGNOSIS — E7800 Pure hypercholesterolemia, unspecified: Secondary | ICD-10-CM | POA: Diagnosis not present

## 2021-02-27 DIAGNOSIS — R7989 Other specified abnormal findings of blood chemistry: Secondary | ICD-10-CM

## 2021-02-27 DIAGNOSIS — I517 Cardiomegaly: Secondary | ICD-10-CM

## 2021-02-27 DIAGNOSIS — I2583 Coronary atherosclerosis due to lipid rich plaque: Secondary | ICD-10-CM

## 2021-02-27 DIAGNOSIS — I10 Essential (primary) hypertension: Secondary | ICD-10-CM

## 2021-02-27 LAB — ECHOCARDIOGRAM COMPLETE
AR max vel: 2.49 cm2
AV Area VTI: 2.59 cm2
AV Area mean vel: 2.58 cm2
AV Mean grad: 3 mmHg
AV Peak grad: 6.4 mmHg
Ao pk vel: 1.26 m/s
Area-P 1/2: 3.21 cm2
Height: 59 in
S' Lateral: 2.8 cm
Weight: 2945.35 oz

## 2021-02-27 LAB — BASIC METABOLIC PANEL
Anion gap: 7 (ref 5–15)
BUN: 19 mg/dL (ref 8–23)
CO2: 25 mmol/L (ref 22–32)
Calcium: 8.2 mg/dL — ABNORMAL LOW (ref 8.9–10.3)
Chloride: 103 mmol/L (ref 98–111)
Creatinine, Ser: 1.05 mg/dL — ABNORMAL HIGH (ref 0.44–1.00)
GFR, Estimated: 52 mL/min — ABNORMAL LOW (ref >60)
Glucose, Bld: 298 mg/dL — ABNORMAL HIGH (ref 70–99)
Potassium: 4.3 mmol/L (ref 3.5–5.1)
Sodium: 135 mmol/L (ref 135–145)

## 2021-02-27 LAB — CBC
HCT: 21.5 % — ABNORMAL LOW (ref 36.0–46.0)
HCT: 24.1 % — ABNORMAL LOW (ref 36.0–46.0)
Hemoglobin: 7.2 g/dL — ABNORMAL LOW (ref 12.0–15.0)
Hemoglobin: 8 g/dL — ABNORMAL LOW (ref 12.0–15.0)
MCH: 29.4 pg (ref 26.0–34.0)
MCH: 29.6 pg (ref 26.0–34.0)
MCHC: 33.2 g/dL (ref 30.0–36.0)
MCHC: 33.5 g/dL (ref 30.0–36.0)
MCV: 88.5 fL (ref 80.0–100.0)
MCV: 88.6 fL (ref 80.0–100.0)
Platelets: 127 10*3/uL — ABNORMAL LOW (ref 150–400)
Platelets: 134 10*3/uL — ABNORMAL LOW (ref 150–400)
RBC: 2.43 MIL/uL — ABNORMAL LOW (ref 3.87–5.11)
RBC: 2.72 MIL/uL — ABNORMAL LOW (ref 3.87–5.11)
RDW: 16 % — ABNORMAL HIGH (ref 11.5–15.5)
RDW: 16.1 % — ABNORMAL HIGH (ref 11.5–15.5)
WBC: 11.2 10*3/uL — ABNORMAL HIGH (ref 4.0–10.5)
WBC: 9.7 10*3/uL (ref 4.0–10.5)
nRBC: 0 % (ref 0.0–0.2)
nRBC: 0 % (ref 0.0–0.2)

## 2021-02-27 LAB — BPAM PLATELET PHERESIS
Blood Product Expiration Date: 202206282359
Unit Type and Rh: 6200

## 2021-02-27 LAB — PREPARE PLATELET PHERESIS: Unit division: 0

## 2021-02-27 LAB — GLUCOSE, CAPILLARY
Glucose-Capillary: 253 mg/dL — ABNORMAL HIGH (ref 70–99)
Glucose-Capillary: 199 mg/dL — ABNORMAL HIGH (ref 70–99)
Glucose-Capillary: 168 mg/dL — ABNORMAL HIGH (ref 70–99)
Glucose-Capillary: 275 mg/dL — ABNORMAL HIGH (ref 70–99)

## 2021-02-27 LAB — IRON AND TIBC
Iron: 18 ug/dL — ABNORMAL LOW (ref 28–170)
Saturation Ratios: 8 % — ABNORMAL LOW (ref 10.4–31.8)
TIBC: 227 ug/dL — ABNORMAL LOW (ref 250–450)
UIBC: 209 ug/dL

## 2021-02-27 LAB — MAGNESIUM: Magnesium: 2 mg/dL (ref 1.7–2.4)

## 2021-02-27 LAB — FOLATE: Folate: 10.6 ng/mL (ref >5.9)

## 2021-02-27 LAB — VITAMIN B12: Vitamin B-12: 149 pg/mL — ABNORMAL LOW (ref 180–914)

## 2021-02-27 LAB — TROPONIN I (HIGH SENSITIVITY): Troponin I (High Sensitivity): 695 ng/L (ref <18)

## 2021-02-27 MED ORDER — VITAMIN D 25 MCG (1000 UNIT) PO TABS
1000.0000 [IU] | ORAL_TABLET | Freq: Every day | ORAL | Status: DC
  Administered 2021-02-27 – 2021-03-16 (×18): 1000 [IU] via ORAL
  Filled 2021-02-27 (×18): qty 1

## 2021-02-27 MED ORDER — INSULIN GLARGINE 100 UNIT/ML ~~LOC~~ SOLN
12.0000 [IU] | Freq: Every day | SUBCUTANEOUS | Status: DC
  Administered 2021-02-27: 12 [IU] via SUBCUTANEOUS
  Filled 2021-02-27 (×2): qty 0.12

## 2021-02-27 MED ORDER — SODIUM CHLORIDE 0.9 % IV SOLN
200.0000 mg | Freq: Every day | INTRAVENOUS | Status: AC
  Administered 2021-02-27 – 2021-03-01 (×3): 200 mg via INTRAVENOUS
  Filled 2021-02-27 (×5): qty 10

## 2021-02-27 MED ORDER — VITAMIN B-12 1000 MCG PO TABS
1000.0000 ug | ORAL_TABLET | Freq: Every day | ORAL | Status: DC
  Administered 2021-02-27 – 2021-03-16 (×18): 1000 ug via ORAL
  Filled 2021-02-27 (×18): qty 1

## 2021-02-27 MED ORDER — PERFLUTREN LIPID MICROSPHERE
1.0000 mL | INTRAVENOUS | Status: AC | PRN
  Administered 2021-02-27: 5 mL via INTRAVENOUS
  Filled 2021-02-27: qty 10
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
## 2021-02-27 NOTE — Progress Notes (Signed)
Orthopaedic Trauma Progress Note  SUBJECTIVE: Sleepy this morning.  Reports minimal to mild pain about operative site. No chest pain. No SOB. No nausea/vomiting. No other complaints.   OBJECTIVE:  Vitals:   02/27/21 0548 02/27/21 0747  BP: (!) 146/52 (!) 142/58  Pulse: 61 65  Resp: 12 17  Temp:  (!) 97.5 F (36.4 C)  SpO2: 99% 98%    General: Resting in bed comfortably, no acute distress Respiratory: No increased work of breathing.  Left lower extremity: Splint in place, remains clean, dry, intact.  Endorses sensation to light touch over toes with a slightly diminished due to nerve block.  Able to wiggle toes small amount.  Toes warm and well-perfused.  Nontender above splint  IMAGING: Stable post op imaging.   LABS:  Results for orders placed or performed during the hospital encounter of 02/24/21 (from the past 24 hour(s))  Glucose, capillary     Status: Abnormal   Collection Time: 02/26/21 10:01 AM  Result Value Ref Range   Glucose-Capillary 142 (H) 70 - 99 mg/dL  Glucose, capillary     Status: Abnormal   Collection Time: 02/26/21 11:12 AM  Result Value Ref Range   Glucose-Capillary 147 (H) 70 - 99 mg/dL  Prepare RBC (crossmatch)     Status: None   Collection Time: 02/26/21 11:19 AM  Result Value Ref Range   Order Confirmation      ORDER PROCESSED BY BLOOD BANK Performed at [facility], [address] [place], [place] [address]   Prepare Pheresed Platelets     Status: None   Collection Time: 02/26/21 11:49 AM  Result Value Ref Range   Unit Number [id number]    Blood Component Type PLTP2 PSORALEN TREATED    Unit division 00    Status of Unit REL FROM [facility]    Transfusion Status      OK TO TRANSFUSE Performed at [facility], [place] [address] [place], [place] [address]   Platelet Mapping ADP and AA     Status: Abnormal   Collection Time: 02/26/21 12:13 PM  Result Value Ref Range   Kaolin with Heparinase 68.3 (H) 53 - 68 mm   Activator F 25.2 (H) 2  - 19 mm   Adenosine 5 Diphosphate (MA) 59.9 45 - 69 mm   ADP Aggregation 80.5 (L) 83 - 100 %   ADP Inhibition 19.5 (H) 0 - 17 %   Arachidonic Acid (MA) 58.4 51 - 71 mm   AA Inhibition 23.0 (H) 0 - 11 %   AA Aggregation 77.0 (L) 89 - 100 %  Glucose, capillary     Status: Abnormal   Collection Time: 02/26/21  1:59 PM  Result Value Ref Range   Glucose-Capillary 144 (H) 70 - 99 mg/dL  Glucose, capillary     Status: Abnormal   Collection Time: 02/26/21  4:57 PM  Result Value Ref Range   Glucose-Capillary 126 (H) 70 - 99 mg/dL  Glucose, capillary     Status: Abnormal   Collection Time: 02/26/21  6:10 PM  Result Value Ref Range   Glucose-Capillary 190 (H) 70 - 99 mg/dL  VITAMIN D 25 Hydroxy (Vit-D Deficiency, Fractures)     Status: Abnormal   Collection Time: 02/26/21  6:34 PM  Result Value Ref Range   Vit D, 25-Hydroxy 25.81 (L) 30 - 100 ng/mL  Glucose, capillary     Status: Abnormal   Collection Time: 02/26/21  8:38 PM  Result Value Ref Range  Glucose-Capillary 313 (H) 70 - 99 mg/dL  CBC     Status: Abnormal   Collection Time: 02/26/21  9:28 PM  Result Value Ref Range   WBC 10.5 4.0 - 10.5 K/uL   RBC 2.47 (L) 3.87 - 5.11 MIL/uL   Hemoglobin 7.3 (L) 12.0 - 15.0 g/dL   HCT 22.0 (L) 36.0 - 46.0 %   MCV 89.1 80.0 - 100.0 fL   MCH 29.6 26.0 - 34.0 pg   MCHC 33.2 30.0 - 36.0 g/dL   RDW 15.9 (H) 11.5 - 15.5 %   Platelets 128 (L) 150 - 400 K/uL   nRBC 0.0 0.0 - 0.2 %  Protime-INR     Status: Abnormal   Collection Time: 02/26/21  9:28 PM  Result Value Ref Range   Prothrombin Time 15.6 (H) 11.4 - 15.2 seconds   INR 1.2 0.8 - 1.2  Comprehensive metabolic panel     Status: Abnormal   Collection Time: 02/26/21  9:28 PM  Result Value Ref Range   Sodium 135 135 - 145 mmol/L   Potassium 4.4 3.5 - 5.1 mmol/L   Chloride 103 98 - 111 mmol/L   CO2 26 22 - 32 mmol/L   Glucose, Bld 346 (H) 70 - 99 mg/dL   BUN 16 8 - 23 mg/dL   Creatinine, Ser 1.17 (H) 0.44 - 1.00 mg/dL   Calcium 8.0 (L)  8.9 - 10.3 mg/dL   Total Protein 4.9 (L) 6.5 - 8.1 g/dL   Albumin 2.3 (L) 3.5 - 5.0 g/dL   AST 22 15 - 41 U/L   ALT 11 0 - 44 U/L   Alkaline Phosphatase 45 38 - 126 U/L   Total Bilirubin 0.6 0.3 - 1.2 mg/dL   GFR, Estimated 45 (L) >60 mL/min   Anion gap 6 5 - 15  Lactic acid, plasma     Status: None   Collection Time: 02/26/21  9:28 PM  Result Value Ref Range   Lactic Acid, Venous 1.2 0.5 - 1.9 mmol/L  Troponin I (High Sensitivity)     Status: Abnormal   Collection Time: 02/26/21  9:28 PM  Result Value Ref Range   Troponin I (High Sensitivity) 784 (HH) <18 ng/L  Blood gas, venous     Status: Abnormal   Collection Time: 02/26/21  9:36 PM  Result Value Ref Range   pH, Ven 7.312 7.250 - 7.430   pCO2, Ven 51.9 44.0 - 60.0 mmHg   pO2, Ven 47.5 (H) 32.0 - 45.0 mmHg   Bicarbonate 25.5 20.0 - 28.0 mmol/L   Acid-Base Excess 0.1 0.0 - 2.0 mmol/L   O2 Saturation 78.5 %   Patient temperature 37.0    Collection site VENOUS    Drawn by 1447    Sample type VEIN   CBC     Status: Abnormal   Collection Time: 02/26/21 11:47 PM  Result Value Ref Range   WBC 11.2 (H) 4.0 - 10.5 K/uL   RBC 2.72 (L) 3.87 - 5.11 MIL/uL   Hemoglobin 8.0 (L) 12.0 - 15.0 g/dL   HCT 24.1 (L) 36.0 - 46.0 %   MCV 88.6 80.0 - 100.0 fL   MCH 29.4 26.0 - 34.0 pg   MCHC 33.2 30.0 - 36.0 g/dL   RDW 16.0 (H) 11.5 - 15.5 %   Platelets 134 (L) 150 - 400 K/uL   nRBC 0.0 0.0 - 0.2 %  Troponin I (High Sensitivity)     Status: Abnormal   Collection Time: 02/27/21  1:26 AM  Result Value Ref Range   Troponin I (High Sensitivity) 695 (HH) <18 ng/L  CBC     Status: Abnormal   Collection Time: 02/27/21  5:00 AM  Result Value Ref Range   WBC 9.7 4.0 - 10.5 K/uL   RBC 2.43 (L) 3.87 - 5.11 MIL/uL   Hemoglobin 7.2 (L) 12.0 - 15.0 g/dL   HCT 21.5 (L) 36.0 - 46.0 %   MCV 88.5 80.0 - 100.0 fL   MCH 29.6 26.0 - 34.0 pg   MCHC 33.5 30.0 - 36.0 g/dL   RDW 16.1 (H) 11.5 - 15.5 %   Platelets 127 (L) 150 - 400 K/uL   nRBC 0.0 0.0 -  0.2 %  Basic metabolic panel     Status: Abnormal   Collection Time: 02/27/21  5:00 AM  Result Value Ref Range   Sodium 135 135 - 145 mmol/L   Potassium 4.3 3.5 - 5.1 mmol/L   Chloride 103 98 - 111 mmol/L   CO2 25 22 - 32 mmol/L   Glucose, Bld 298 (H) 70 - 99 mg/dL   BUN 19 8 - 23 mg/dL   Creatinine, Ser 1.05 (H) 0.44 - 1.00 mg/dL   Calcium 8.2 (L) 8.9 - 10.3 mg/dL   GFR, Estimated 52 (L) >60 mL/min   Anion gap 7 5 - 15  Magnesium     Status: None   Collection Time: 02/27/21  5:00 AM  Result Value Ref Range   Magnesium 2.0 1.7 - 2.4 mg/dL  Glucose, capillary     Status: Abnormal   Collection Time: 02/27/21  6:24 AM  Result Value Ref Range   Glucose-Capillary 253 (H) 70 - 99 mg/dL    ASSESSMENT: Cassandra [name] is a 87 y.o. female, 1 Day Post-Op s/p ORIF LEFT ANKLE FRACTURE REMOVAL EXTERNAL FIXATION LEG  CV/Blood loss: Acute blood loss anemia, Hgb 7.2 this AM.  Hemodynamically stable  PLAN: Weightbearing: NWB LLE Incisional and dressing care: Dressings left intact until follow-up  Showering: Ok to shower, keep splint dry Orthopedic device(s): Splint  Pain management:  1. Tylenol 650 mg q 6 hours scheduled 2. Robaxin 500 mg q 6 hours PRN 3. Oxycodone 5-10 mg q 4 hours PRN 4. Neurontin 100 mg TID 5. Dilaudid 1 mg q 3 hours PRN VTE prophylaxis: Hold Lovenox until Hgb stabilizes. SCDs for now ID: Ancef 2gm post op Foley/Lines:  No foley, KVO IVFs Impediments to Fracture Healing: Vitamin D level 25, will start on D3 supplementation Dispo: PT/OT evaluation today, dispo pending.  Plan to keep patient in splint for 2 weeks postop.  Follow - up plan: 2 weeks after discharge for repeat x-rays left ankle and transition out of splint and into Cam boot  Contact information:  [name] MD, [name] PA-C. After hours and holidays please check [email or web address] for group call information for [facility]   Semiah Konczal A. Ricci Barker, PA-C [phone]  (office) [email or web address]

## 2021-02-27 NOTE — Plan of Care (Signed)

## 2021-02-27 NOTE — Progress Notes (Signed)
   02/26/21 2032  Vitals  Temp 98 F (36.7 C)  Temp Source Oral  BP (!) 116/35  MAP (mmHg) (!) 60  BP Location Right Arm  BP Method Automatic  Patient Position (if appropriate) Lying  Pulse Rate 94  Pulse Rate Source Monitor  ECG Heart Rate 94  Resp 20  Level of Consciousness  Level of Consciousness Alert  MEWS COLOR  MEWS Score Color Green  Oxygen Therapy  SpO2 99 %  O2 Device Nasal Cannula  O2 Flow Rate (L/min) 2 L/min  Pain Assessment  Pain Scale 0-10  Pain Score 0  MEWS Score  MEWS Temp 0  MEWS Systolic 0  MEWS Pulse 0  MEWS RR 0  MEWS LOC 0  MEWS Score 0

## 2021-02-27 NOTE — Progress Notes (Addendum)
Patient sleeping awaken and alert and answers all question . Neuro checks done and intact. Patient is adiaphoric and warm . CBG done 313 . No complaints voiced no pain.V.S. B.P. 119/34 MAP- 57 -Pulse 68 Resp 16-20. NO resp. problems. Redden R.N. aware and Rapid  Response call due to patient being sleepy and easily awaken and diaphoric . Dr. Olevia Bowens text paged and aware and will review chart and come see patient. See vital sign flow sheet for freq. v.s.

## 2021-02-27 NOTE — Progress Notes (Signed)
Text paged Dr. Barbee Shropshire. [phone] and to review all labs and v.s.
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
## 2021-02-27 NOTE — Care Management Important Message (Signed)
Important Message  Patient Details  Name: Cassandra [name] MRN: [id number] Date of Birth: 12/18/1932   Medicare Important Message Given:  Yes     [name] Montine Circle 02/27/2021, 2:47 PM

## 2021-02-27 NOTE — Anesthesia Postprocedure Evaluation (Signed)
Anesthesia Post Note  Patient: Cassandra [name]  Procedure(s) Performed: OPEN REDUCTION INTERNAL FIXATION (ORIF) ANKLE FRACTURE (Left: Ankle) REMOVAL EXTERNAL FIXATION LEG (Left)     Patient location during evaluation: PACU Anesthesia Type: General Level of consciousness: awake and alert Pain management: pain level controlled Vital Signs Assessment: post-procedure vital signs reviewed and stable Respiratory status: spontaneous breathing, nonlabored ventilation, respiratory function stable and patient connected to nasal cannula oxygen Cardiovascular status: blood pressure returned to baseline and stable Postop Assessment: no apparent nausea or vomiting Anesthetic complications: no   No notable events documented.  Last Vitals:  Vitals:   02/27/21 0548 02/27/21 0747  BP: (!) 146/52 (!) 142/58  Pulse: 61 65  Resp: 12 17  Temp:  (!) 36.4 C  SpO2: 99% 98%    Last Pain:  Vitals:   02/27/21 0747  TempSrc: Axillary  PainSc: 0-No pain                 Chaston Bradburn S
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
## 2021-02-27 NOTE — Anesthesia Postprocedure Evaluation (Signed)
Anesthesia Post Note  Patient: Cassandra [name]  Procedure(s) Performed: OPEN REDUCTION INTERNAL FIXATION (ORIF) ANKLE FRACTURE (Left: Ankle) REMOVAL EXTERNAL FIXATION LEG (Left)     Anesthesia Post Evaluation No notable events documented.  Last Vitals:  Vitals:   02/27/21 0548 02/27/21 0747  BP: (!) 146/52 (!) 142/58  Pulse: 61 65  Resp: 12 17  Temp:  (!) 36.4 C  SpO2: 99% 98%    Last Pain:  Vitals:   02/27/21 0747  TempSrc: Axillary  PainSc: 0-No pain                 Chimere Klingensmith S

## 2021-02-27 NOTE — Progress Notes (Signed)
RT note. Patient refusing cpap for the night, cpap is in patients room if she changes her mind. RT will continue to monitor

## 2021-02-28 ENCOUNTER — Encounter (HOSPITAL_COMMUNITY): Payer: Self-pay | Admitting: Student

## 2021-02-28 ENCOUNTER — Inpatient Hospital Stay (HOSPITAL_COMMUNITY): Payer: Medicare Other

## 2021-02-28 DIAGNOSIS — S82892C Other fracture of left lower leg, initial encounter for open fracture type IIIA, IIIB, or IIIC: Secondary | ICD-10-CM

## 2021-02-28 DIAGNOSIS — R778 Other specified abnormalities of plasma proteins: Secondary | ICD-10-CM | POA: Diagnosis not present

## 2021-02-28 DIAGNOSIS — I2510 Atherosclerotic heart disease of native coronary artery without angina pectoris: Secondary | ICD-10-CM | POA: Diagnosis not present

## 2021-02-28 DIAGNOSIS — I272 Pulmonary hypertension, unspecified: Secondary | ICD-10-CM

## 2021-02-28 DIAGNOSIS — I10 Essential (primary) hypertension: Secondary | ICD-10-CM | POA: Diagnosis not present

## 2021-02-28 DIAGNOSIS — E7800 Pure hypercholesterolemia, unspecified: Secondary | ICD-10-CM | POA: Diagnosis not present

## 2021-02-28 LAB — BPAM RBC
Blood Product Expiration Date: 202207162359
Blood Product Expiration Date: 202207172359
Blood Product Expiration Date: 202207172359
ISSUE DATE / TIME: 202206252311
ISSUE DATE / TIME: 202206252311
ISSUE DATE / TIME: 202206271225
Unit Type and Rh: 6200
Unit Type and Rh: 6200
Unit Type and Rh: 6200

## 2021-02-28 LAB — BASIC METABOLIC PANEL
Anion gap: 5 (ref 5–15)
BUN: 20 mg/dL (ref 8–23)
CO2: 26 mmol/L (ref 22–32)
Calcium: 8.1 mg/dL — ABNORMAL LOW (ref 8.9–10.3)
Chloride: 105 mmol/L (ref 98–111)
Creatinine, Ser: 0.98 mg/dL (ref 0.44–1.00)
GFR, Estimated: 56 mL/min — ABNORMAL LOW (ref >60)
Glucose, Bld: 252 mg/dL — ABNORMAL HIGH (ref 70–99)
Potassium: 4.3 mmol/L (ref 3.5–5.1)
Sodium: 136 mmol/L (ref 135–145)

## 2021-02-28 LAB — CBC
HCT: 20 % — ABNORMAL LOW (ref 36.0–46.0)
Hemoglobin: 6.5 g/dL — CL (ref 12.0–15.0)
MCH: 29.8 pg (ref 26.0–34.0)
MCHC: 32.5 g/dL (ref 30.0–36.0)
MCV: 91.7 fL (ref 80.0–100.0)
Platelets: 157 10*3/uL (ref 150–400)
RBC: 2.18 MIL/uL — ABNORMAL LOW (ref 3.87–5.11)
RDW: 16.5 % — ABNORMAL HIGH (ref 11.5–15.5)
WBC: 8.1 10*3/uL (ref 4.0–10.5)
nRBC: 0.2 % (ref 0.0–0.2)

## 2021-02-28 LAB — GLUCOSE, CAPILLARY
Glucose-Capillary: 178 mg/dL — ABNORMAL HIGH (ref 70–99)
Glucose-Capillary: 266 mg/dL — ABNORMAL HIGH (ref 70–99)
Glucose-Capillary: 110 mg/dL — ABNORMAL HIGH (ref 70–99)
Glucose-Capillary: 146 mg/dL — ABNORMAL HIGH (ref 70–99)

## 2021-02-28 LAB — TYPE AND SCREEN
ABO/RH(D): A POS
Antibody Screen: NEGATIVE
Unit division: 0
Unit division: 0
Unit division: 0

## 2021-02-28 LAB — HEMOGLOBIN AND HEMATOCRIT, BLOOD
HCT: 29.1 % — ABNORMAL LOW (ref 36.0–46.0)
Hemoglobin: 9.3 g/dL — ABNORMAL LOW (ref 12.0–15.0)

## 2021-02-28 LAB — MAGNESIUM: Magnesium: 1.9 mg/dL (ref 1.7–2.4)

## 2021-02-28 LAB — PREPARE RBC (CROSSMATCH)

## 2021-02-28 MED ORDER — INSULIN ASPART 100 UNIT/ML IJ SOLN
4.0000 [IU] | Freq: Three times a day (TID) | INTRAMUSCULAR | Status: DC
  Administered 2021-02-28 – 2021-03-16 (×36): 4 [IU] via SUBCUTANEOUS

## 2021-02-28 MED ORDER — INSULIN GLARGINE 100 UNIT/ML ~~LOC~~ SOLN
14.0000 [IU] | Freq: Every day | SUBCUTANEOUS | Status: DC
  Administered 2021-02-28 – 2021-03-15 (×15): 14 [IU] via SUBCUTANEOUS
  Filled 2021-02-28 (×17): qty 0.14

## 2021-02-28 MED ORDER — POLYETHYLENE GLYCOL 3350 17 G PO PACK
17.0000 g | PACK | Freq: Two times a day (BID) | ORAL | Status: DC
  Administered 2021-03-01 – 2021-03-15 (×8): 17 g via ORAL
  Filled 2021-02-28 (×15): qty 1

## 2021-02-28 MED ORDER — HYDROCODONE-ACETAMINOPHEN 7.5-325 MG PO TABS
1.0000 | ORAL_TABLET | ORAL | Status: DC | PRN
  Administered 2021-02-28 – 2021-03-06 (×7): 1 via ORAL
  Filled 2021-02-28 (×7): qty 1

## 2021-02-28 MED ORDER — IOHEXOL 350 MG/ML SOLN
75.0000 mL | Freq: Once | INTRAVENOUS | Status: AC | PRN
  Administered 2021-02-28: 75 mL via INTRAVENOUS

## 2021-02-28 MED ORDER — SODIUM CHLORIDE 0.9% IV SOLUTION: Freq: Once | INTRAVENOUS | Status: AC

## 2021-02-28 NOTE — Progress Notes (Signed)
Inpatient Diabetes Program Recommendations  AACE/ADA: New Consensus Statement on Inpatient Glycemic Control (2015)  Target Ranges:  Prepandial:   less than 140 mg/dL      Peak postprandial:   less than 180 mg/dL (1-2 hours)      Critically ill patients:  140 - 180 mg/dL   Results for Cassandra, [name] (MRN [id number]) as of 02/28/2021 12:09  Ref. Range 02/28/2021 06:18 02/28/2021 11:59  Glucose-Capillary Latest Ref Range: 70 - 99 mg/dL 178 (H)  3 units NOVOLOG  266 (H)  8 units NOVOLOG      Home DM Meds: Lantus 12 units QHS       Novolog 8 units TID       Metformin 1000 mg BID       Actos 30 mg Daily   Current Orders: Lantus 12 units QHS      Novolog Moderate Correction Scale/ SSI (0-15 units) TID AC    MD- Note Lantus started last PM.  CBGs remain elevated.  Please consider:  1. Increase Lantus slightly to 14 units QHS  2. Start Novolog Meal Coverage: Novolog 4 units TID with meals (50% home dose to start) Hold if pt eats <50% of meal, Hold if pt NPO    --Will follow patient during hospitalization--  Wyn Quaker RN, MSN, CDE Diabetes Coordinator Inpatient Glycemic Control Team Team Pager: [phone] (8a-5p)

## 2021-02-28 NOTE — TOC Progression Note (Signed)
Transition of Care [facility]) - Progression Note    Patient Details  Name: ICYSS SKOG MRN: [id number] Date of Birth: 01-08-1933  [facility]) CM/SW [place], [place] Phone Number: 02/28/2021, 1:33 PM  Clinical Narrative:     [facility] has declined- patient has no bed offers at this time  Thurmond Butts, MSW, LCSW Clinical Social Worker      Barriers to Discharge: Continued Medical Work up, SNF Pending bed offer  Expected Discharge Plan and Services   In-house Referral: Clinical Social Work                                             Social Determinants of Health (SDOH) Interventions    Readmission Risk Interventions No flowsheet data found.

## 2021-02-28 NOTE — Progress Notes (Signed)
HOSPITAL MEDICINE OVERNIGHT EVENT NOTE    Notified by nursing that patient's hemoglobin this morning is 6.5.  Chart reviewed.  Hemoglobin has been downtrending from 8 to 7.2 to 6.5 over the past 3 days.  No obvious evidence of active bleeding.  Patient has a left flank hematoma that nursing reports has not changed in size.  Vital signs are stable.  Patient denies shortness of breath or chest pain.  Will obtain type and screen.  Will order 1 unit packed red blood cell transfusion.  We will obtain posttransfusion H&H.  Vernelle Emerald  MD [facility]

## 2021-02-28 NOTE — Progress Notes (Signed)
Critical hemoglobin 6.5 this am, on call MD Dr. Cyd Silence text paged. Awaiting order. Will continue to monitor.
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
## 2021-02-28 NOTE — Progress Notes (Signed)
Orthopaedic Trauma Progress Note  SUBJECTIVE: Doing well this morning, reports minimal to mild pain in left ankle. No other complaints. Was able to sit on EOB with therapies yesterday. Therapies recommending SNF, patient in agreement to this. Would like to [facility] if available. Hgb has dropped since surgery. Denies any dizziness or light-headedness this morning.   OBJECTIVE:  Vitals:   02/28/21 0115 02/28/21 0424  BP:  (!) 118/42  Pulse: 64 60  Resp: 16 20  Temp:  98.7 F (37.1 C)  SpO2: 98% 100%    General: Sitting up in bed, no acute distress Respiratory: No increased work of breathing.  Left lower extremity: Splint in place, remains clean, dry, intact.  Endorses sensation to light touch over toes. Wiggles toes.  Toes warm and well-perfused.  Nontender above splint  IMAGING: Stable post op imaging.   LABS:  Results for orders placed or performed during the hospital encounter of 02/24/21 (from the past 24 hour(s))  Glucose, capillary     Status: Abnormal   Collection Time: 02/27/21 11:33 AM  Result Value Ref Range   Glucose-Capillary 199 (H) 70 - 99 mg/dL  Iron and TIBC     Status: Abnormal   Collection Time: 02/27/21 12:33 PM  Result Value Ref Range   Iron 18 (L) 28 - 170 ug/dL   TIBC 227 (L) 250 - 450 ug/dL   Saturation Ratios 8 (L) 10.4 - 31.8 %   UIBC 209 ug/dL  Vitamin B12     Status: Abnormal   Collection Time: 02/27/21 12:33 PM  Result Value Ref Range   Vitamin B-12 149 (L) 180 - 914 pg/mL  Folate     Status: None   Collection Time: 02/27/21 12:33 PM  Result Value Ref Range   Folate 10.6 >5.9 ng/mL  Glucose, capillary     Status: Abnormal   Collection Time: 02/27/21  4:22 PM  Result Value Ref Range   Glucose-Capillary 168 (H) 70 - 99 mg/dL  Glucose, capillary     Status: Abnormal   Collection Time: 02/27/21  9:43 PM  Result Value Ref Range   Glucose-Capillary 275 (H) 70 - 99 mg/dL   Comment 1 Notify RN    Comment 2 Document in Chart   Magnesium      Status: None   Collection Time: 02/28/21  1:11 AM  Result Value Ref Range   Magnesium 1.9 1.7 - 2.4 mg/dL  Basic metabolic panel     Status: Abnormal   Collection Time: 02/28/21  1:11 AM  Result Value Ref Range   Sodium 136 135 - 145 mmol/L   Potassium 4.3 3.5 - 5.1 mmol/L   Chloride 105 98 - 111 mmol/L   CO2 26 22 - 32 mmol/L   Glucose, Bld 252 (H) 70 - 99 mg/dL   BUN 20 8 - 23 mg/dL   Creatinine, Ser 0.98 0.44 - 1.00 mg/dL   Calcium 8.1 (L) 8.9 - 10.3 mg/dL   GFR, Estimated 56 (L) >60 mL/min   Anion gap 5 5 - 15  CBC     Status: Abnormal   Collection Time: 02/28/21  1:11 AM  Result Value Ref Range   WBC 8.1 4.0 - 10.5 K/uL   RBC 2.18 (L) 3.87 - 5.11 MIL/uL   Hemoglobin 6.5 (LL) 12.0 - 15.0 g/dL   HCT 20.0 (L) 36.0 - 46.0 %   MCV 91.7 80.0 - 100.0 fL   MCH 29.8 26.0 - 34.0 pg   MCHC 32.5 30.0 -  36.0 g/dL   RDW 16.5 (H) 11.5 - 15.5 %   Platelets 157 150 - 400 K/uL   nRBC 0.2 0.0 - 0.2 %  Glucose, capillary     Status: Abnormal   Collection Time: 02/28/21  6:18 AM  Result Value Ref Range   Glucose-Capillary 178 (H) 70 - 99 mg/dL   Comment 1 Notify RN    Comment 2 Document in Chart     ASSESSMENT: Cassandra [name] is a 87 y.o. female, 2 Days Post-Op s/p ORIF LEFT ANKLE FRACTURE REMOVAL EXTERNAL FIXATION LEG  CV/Blood loss: Acute blood loss anemia, Hgb 6.5 this AM.  1 unit PRBCs order per primary team  PLAN: Weightbearing: NWB LLE Incisional and dressing care: Dressings left intact until follow-up  Showering: Ok to shower, keep splint dry Orthopedic device(s): Splint  Pain management:  1. Tylenol 325-650 mg q 6 hours PRN 2. Norco 7.5-325 mg q 4 hours PRN 3. Morphine 0.5-1 mg q 3 hours PRN VTE prophylaxis: Hold Lovenox until Hgb stabilizes. SCDs for now ID: Ceftriaxone post op for open fracture to be completed today Foley/Lines:  No foley, KVO IVFs Impediments to Fracture Healing: Vitamin D level 25, continue D3 supplementation Dispo: PT/OT as tolerated, currently  recommending SNF. Plan to keep patient in splint for 2 weeks postop. Continue to monitor CBC  Follow - up plan: 2 weeks after discharge for repeat x-rays left ankle and transition out of splint and into CAM boot  Contact information:  [name] MD, [name] PA-C. After hours and holidays please check [email or web address] for group call information for [facility]   Tayvin Preslar A. Ricci Barker, PA-C [phone] (office) [email or web address]
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
## 2021-03-01 DIAGNOSIS — R778 Other specified abnormalities of plasma proteins: Secondary | ICD-10-CM | POA: Diagnosis not present

## 2021-03-01 DIAGNOSIS — I2510 Atherosclerotic heart disease of native coronary artery without angina pectoris: Secondary | ICD-10-CM | POA: Diagnosis not present

## 2021-03-01 DIAGNOSIS — I10 Essential (primary) hypertension: Secondary | ICD-10-CM | POA: Diagnosis not present

## 2021-03-01 DIAGNOSIS — E7800 Pure hypercholesterolemia, unspecified: Secondary | ICD-10-CM | POA: Diagnosis not present

## 2021-03-01 LAB — BASIC METABOLIC PANEL
Anion gap: 5 (ref 5–15)
BUN: 11 mg/dL (ref 8–23)
CO2: 27 mmol/L (ref 22–32)
Calcium: 8 mg/dL — ABNORMAL LOW (ref 8.9–10.3)
Chloride: 104 mmol/L (ref 98–111)
Creatinine, Ser: 0.86 mg/dL (ref 0.44–1.00)
GFR, Estimated: >60 mL/min (ref >60)
Glucose, Bld: 153 mg/dL — ABNORMAL HIGH (ref 70–99)
Potassium: 3.9 mmol/L (ref 3.5–5.1)
Sodium: 136 mmol/L (ref 135–145)

## 2021-03-01 LAB — BPAM RBC
Blood Product Expiration Date: 202207082359
ISSUE DATE / TIME: 202206290930
Unit Type and Rh: 6200

## 2021-03-01 LAB — CBC
HCT: 25.1 % — ABNORMAL LOW (ref 36.0–46.0)
Hemoglobin: 8.3 g/dL — ABNORMAL LOW (ref 12.0–15.0)
MCH: 30.3 pg (ref 26.0–34.0)
MCHC: 33.1 g/dL (ref 30.0–36.0)
MCV: 91.6 fL (ref 80.0–100.0)
Platelets: 180 10*3/uL (ref 150–400)
RBC: 2.74 MIL/uL — ABNORMAL LOW (ref 3.87–5.11)
RDW: 16 % — ABNORMAL HIGH (ref 11.5–15.5)
WBC: 7.4 10*3/uL (ref 4.0–10.5)
nRBC: 0.9 % — ABNORMAL HIGH (ref 0.0–0.2)

## 2021-03-01 LAB — GLUCOSE, CAPILLARY
Glucose-Capillary: 125 mg/dL — ABNORMAL HIGH (ref 70–99)
Glucose-Capillary: 154 mg/dL — ABNORMAL HIGH (ref 70–99)
Glucose-Capillary: 203 mg/dL — ABNORMAL HIGH (ref 70–99)
Glucose-Capillary: 211 mg/dL — ABNORMAL HIGH (ref 70–99)

## 2021-03-01 LAB — TYPE AND SCREEN
ABO/RH(D): A POS
Antibody Screen: NEGATIVE
Unit division: 0

## 2021-03-01 LAB — MAGNESIUM: Magnesium: 1.8 mg/dL (ref 1.7–2.4)

## 2021-03-01 MED ORDER — HYDROCODONE-ACETAMINOPHEN 7.5-325 MG PO TABS: 1.0000 | ORAL_TABLET | Freq: Four times a day (QID) | ORAL | 0 refills | Status: DC | PRN

## 2021-03-01 MED ORDER — DOCUSATE SODIUM 100 MG PO CAPS: 100.0000 mg | ORAL_CAPSULE | Freq: Two times a day (BID) | ORAL | 0 refills | Status: DC

## 2021-03-01 MED ORDER — VITAMIN D3 25 MCG PO TABS: 1000.0000 [IU] | ORAL_TABLET | Freq: Every day | ORAL | 2 refills | Status: DC
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
## 2021-03-01 NOTE — Discharge Instructions (Signed)
Cassandra Hamming, MD Cassandra Pace PA-C [facility] [name] [phone] [name] (fax)                                  POST-OPERATIVE INSTRUCTIONS     WEIGHT BEARING STATUS: Non-weightbearing left leg  RANGE OF MOTION/ACTIVITY: Ok for knee motion. Don't remove splint  WOUND CARE Please keep splint clean dry and intact until follow-up. If your splint gets wet for any reason please contact the office immediately.  Do not stick anything down your splint such as pencils, momey, hangers to try and scratch yourself.  If you feel itchy take Benadryl as prescribed on the bottle for itching You may shower on Post-Op Day #2.  You must keep splint dry during this process and may find that a plastic bag taped around the extremity or alternatively a towel based bath may be a better option.   If you get your splint wet or if it is damaged please contact our clinic.  EXERCISES Due to your splint being in place you will not be able to bear weight through your extremity.   DO NOT PUT ANY WEIGHT ON YOUR OPERATIVE LEG Please use crutches or a walker to avoid weight bearing.   DVT/PE prophylaxis: Aspirin and Plavix  DIET: As you were eating previously.  Can use over the counter stool softeners and bowel preparations, such as Miralax, to help with bowel movements.  Narcotics can be constipating.  Be sure to drink plenty of fluids  REGIONAL ANESTHESIA (NERVE BLOCKS) The anesthesia team may have performed a nerve block for you if safe in the setting of your care.  This is a great tool used to minimize pain.  Typically the block may start wearing off overnight but the long acting medicine may last for 3-4 days.  The nerve block wearing off can be a challenging period but please utilize your as needed pain medications to try and manage this period.    POST-OP MEDICATIONS- Multimodal approach to pain control  In general your pain will be controlled with a  combination of substances.  Prescriptions unless otherwise discussed are electronically sent to your pharmacy.  This is a carefully made plan we use to minimize narcotic use.     - Acetaminophen - Non-narcotic pain medicine taken on a scheduled basis   - Hydrocodone - This is a strong narcotic, to be used only on an "as needed" basis for pain.             -          Zofran - take as needed for nausea   FOLLOW-UP If you develop a Fever (>101.5), Redness or Drainage from the surgical incision site, please call our office to arrange for an evaluation. Please call the office to schedule a follow-up appointment for your incision check if you do not already have one, 7-10 days post-operatively.   VISIT OUR WEBSITE FOR ADDITIONAL INFORMATION: [email or web address]   HELPFUL INFORMATION  If you had a block, it will wear off between 8-24 hrs postop typically.  This is period when your pain may go from nearly zero to the pain you would have had postop without the block.  This is an abrupt transition but nothing dangerous is happening.  You may take an extra dose of narcotic when this happens.  You should wean off your narcotic medicines as soon as  you are able.  Most patients will be off or using minimal narcotics before their first postop appointment.   We suggest you use the pain medication the first night prior to going to bed, in order to ease any pain when the anesthesia wears off. You should avoid taking pain medications on an empty stomach as it will make you nauseous.  Do not drink alcoholic beverages or take illicit drugs when taking pain medications.  In most states it is against the law to drive while you are in a splint or sling.  And certainly against the law to drive while taking narcotics.  You may return to work/school in the next couple of days when you feel up to it.   Pain medication may make you constipated.  Below are a few solutions to try in this order: Decrease the amount of  pain medication if you aren't having pain. Drink lots of decaffeinated fluids. Drink prune juice and/or each dried prunes  If the first 3 don't work start with additional solutions Take Colace - an over-the-counter stool softener Take Senokot - an over-the-counter laxative Take Miralax - a stronger over-the-counter laxative

## 2021-03-01 NOTE — Plan of Care (Signed)

## 2021-03-02 DIAGNOSIS — I2510 Atherosclerotic heart disease of native coronary artery without angina pectoris: Secondary | ICD-10-CM | POA: Diagnosis not present

## 2021-03-02 DIAGNOSIS — R778 Other specified abnormalities of plasma proteins: Secondary | ICD-10-CM | POA: Diagnosis not present

## 2021-03-02 DIAGNOSIS — E7800 Pure hypercholesterolemia, unspecified: Secondary | ICD-10-CM | POA: Diagnosis not present

## 2021-03-02 DIAGNOSIS — I10 Essential (primary) hypertension: Secondary | ICD-10-CM | POA: Diagnosis not present

## 2021-03-02 LAB — MAGNESIUM: Magnesium: 1.8 mg/dL (ref 1.7–2.4)

## 2021-03-02 LAB — CBC
HCT: 26.9 % — ABNORMAL LOW (ref 36.0–46.0)
Hemoglobin: 8.6 g/dL — ABNORMAL LOW (ref 12.0–15.0)
MCH: 30.2 pg (ref 26.0–34.0)
MCHC: 32 g/dL (ref 30.0–36.0)
MCV: 94.4 fL (ref 80.0–100.0)
Platelets: 209 10*3/uL (ref 150–400)
RBC: 2.85 MIL/uL — ABNORMAL LOW (ref 3.87–5.11)
RDW: 16.4 % — ABNORMAL HIGH (ref 11.5–15.5)
WBC: 6.9 10*3/uL (ref 4.0–10.5)
nRBC: 0.7 % — ABNORMAL HIGH (ref 0.0–0.2)

## 2021-03-02 LAB — GLUCOSE, CAPILLARY
Glucose-Capillary: 138 mg/dL — ABNORMAL HIGH (ref 70–99)
Glucose-Capillary: 138 mg/dL — ABNORMAL HIGH (ref 70–99)
Glucose-Capillary: 174 mg/dL — ABNORMAL HIGH (ref 70–99)
Glucose-Capillary: 179 mg/dL — ABNORMAL HIGH (ref 70–99)
Glucose-Capillary: 148 mg/dL — ABNORMAL HIGH (ref 70–99)

## 2021-03-02 MED ORDER — ENOXAPARIN SODIUM 40 MG/0.4ML IJ SOSY
40.0000 mg | PREFILLED_SYRINGE | INTRAMUSCULAR | Status: DC
  Administered 2021-03-02 – 2021-03-15 (×14): 40 mg via SUBCUTANEOUS
  Filled 2021-03-02 (×14): qty 0.4
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
## 2021-03-03 DIAGNOSIS — I10 Essential (primary) hypertension: Secondary | ICD-10-CM | POA: Diagnosis not present

## 2021-03-03 DIAGNOSIS — I609 Nontraumatic subarachnoid hemorrhage, unspecified: Secondary | ICD-10-CM

## 2021-03-03 DIAGNOSIS — M25579 Pain in unspecified ankle and joints of unspecified foot: Secondary | ICD-10-CM | POA: Diagnosis not present

## 2021-03-03 LAB — CBC
HCT: 28.9 % — ABNORMAL LOW (ref 36.0–46.0)
Hemoglobin: 9.1 g/dL — ABNORMAL LOW (ref 12.0–15.0)
MCH: 29.4 pg (ref 26.0–34.0)
MCHC: 31.5 g/dL (ref 30.0–36.0)
MCV: 93.5 fL (ref 80.0–100.0)
Platelets: 239 10*3/uL (ref 150–400)
RBC: 3.09 MIL/uL — ABNORMAL LOW (ref 3.87–5.11)
RDW: 17.1 % — ABNORMAL HIGH (ref 11.5–15.5)
WBC: 7.7 10*3/uL (ref 4.0–10.5)
nRBC: 0.8 % — ABNORMAL HIGH (ref 0.0–0.2)

## 2021-03-03 LAB — GLUCOSE, CAPILLARY
Glucose-Capillary: 125 mg/dL — ABNORMAL HIGH (ref 70–99)
Glucose-Capillary: 155 mg/dL — ABNORMAL HIGH (ref 70–99)
Glucose-Capillary: 144 mg/dL — ABNORMAL HIGH (ref 70–99)
Glucose-Capillary: 175 mg/dL — ABNORMAL HIGH (ref 70–99)

## 2021-03-03 MED ORDER — LIP MEDEX EX OINT
TOPICAL_OINTMENT | CUTANEOUS | Status: DC | PRN
  Administered 2021-03-03: 1 via TOPICAL
  Filled 2021-03-03: qty 7
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
## 2021-03-04 DIAGNOSIS — I609 Nontraumatic subarachnoid hemorrhage, unspecified: Secondary | ICD-10-CM | POA: Diagnosis not present

## 2021-03-04 DIAGNOSIS — M25579 Pain in unspecified ankle and joints of unspecified foot: Secondary | ICD-10-CM | POA: Diagnosis not present

## 2021-03-04 DIAGNOSIS — I10 Essential (primary) hypertension: Secondary | ICD-10-CM | POA: Diagnosis not present

## 2021-03-04 LAB — GLUCOSE, CAPILLARY
Glucose-Capillary: 182 mg/dL — ABNORMAL HIGH (ref 70–99)
Glucose-Capillary: 150 mg/dL — ABNORMAL HIGH (ref 70–99)
Glucose-Capillary: 236 mg/dL — ABNORMAL HIGH (ref 70–99)
Glucose-Capillary: 175 mg/dL — ABNORMAL HIGH (ref 70–99)
Glucose-Capillary: 138 mg/dL — ABNORMAL HIGH (ref 70–99)

## 2021-03-04 LAB — CBC
HCT: 30.2 % — ABNORMAL LOW (ref 36.0–46.0)
Hemoglobin: 9.6 g/dL — ABNORMAL LOW (ref 12.0–15.0)
MCH: 30 pg (ref 26.0–34.0)
MCHC: 31.8 g/dL (ref 30.0–36.0)
MCV: 94.4 fL (ref 80.0–100.0)
Platelets: 277 10*3/uL (ref 150–400)
RBC: 3.2 MIL/uL — ABNORMAL LOW (ref 3.87–5.11)
RDW: 17.2 % — ABNORMAL HIGH (ref 11.5–15.5)
WBC: 7.9 10*3/uL (ref 4.0–10.5)
nRBC: 0.3 % — ABNORMAL HIGH (ref 0.0–0.2)

## 2021-03-04 MED ORDER — CLOPIDOGREL BISULFATE 75 MG PO TABS
75.0000 mg | ORAL_TABLET | Freq: Every day | ORAL | Status: DC
  Administered 2021-03-04 – 2021-03-16 (×13): 75 mg via ORAL
  Filled 2021-03-04 (×13): qty 1

## 2021-03-04 NOTE — Plan of Care (Signed)
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
## 2021-03-05 DIAGNOSIS — I609 Nontraumatic subarachnoid hemorrhage, unspecified: Secondary | ICD-10-CM | POA: Diagnosis not present

## 2021-03-05 DIAGNOSIS — I10 Essential (primary) hypertension: Secondary | ICD-10-CM | POA: Diagnosis not present

## 2021-03-05 DIAGNOSIS — M25579 Pain in unspecified ankle and joints of unspecified foot: Secondary | ICD-10-CM | POA: Diagnosis not present

## 2021-03-05 LAB — CBC
HCT: 31.8 % — ABNORMAL LOW (ref 36.0–46.0)
Hemoglobin: 9.7 g/dL — ABNORMAL LOW (ref 12.0–15.0)
MCH: 29.1 pg (ref 26.0–34.0)
MCHC: 30.5 g/dL (ref 30.0–36.0)
MCV: 95.5 fL (ref 80.0–100.0)
Platelets: 333 10*3/uL (ref 150–400)
RBC: 3.33 MIL/uL — ABNORMAL LOW (ref 3.87–5.11)
RDW: 17.5 % — ABNORMAL HIGH (ref 11.5–15.5)
WBC: 8.2 10*3/uL (ref 4.0–10.5)
nRBC: 0 % (ref 0.0–0.2)

## 2021-03-05 LAB — BASIC METABOLIC PANEL WITH GFR
Anion gap: 5 (ref 5–15)
BUN: 14 mg/dL (ref 8–23)
CO2: 30 mmol/L (ref 22–32)
Calcium: 8.8 mg/dL — ABNORMAL LOW (ref 8.9–10.3)
Chloride: 103 mmol/L (ref 98–111)
Creatinine, Ser: 0.95 mg/dL (ref 0.44–1.00)
GFR, Estimated: 58 mL/min — ABNORMAL LOW
Glucose, Bld: 142 mg/dL — ABNORMAL HIGH (ref 70–99)
Potassium: 4.3 mmol/L (ref 3.5–5.1)
Sodium: 138 mmol/L (ref 135–145)

## 2021-03-05 LAB — GLUCOSE, CAPILLARY
Glucose-Capillary: 113 mg/dL — ABNORMAL HIGH (ref 70–99)
Glucose-Capillary: 223 mg/dL — ABNORMAL HIGH (ref 70–99)
Glucose-Capillary: 163 mg/dL — ABNORMAL HIGH (ref 70–99)
Glucose-Capillary: 117 mg/dL — ABNORMAL HIGH (ref 70–99)

## 2021-03-05 MED ORDER — BISACODYL 10 MG RE SUPP: 10.0000 mg | Freq: Once | RECTAL | Status: DC

## 2021-03-05 NOTE — Plan of Care (Signed)
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
## 2021-03-06 DIAGNOSIS — I609 Nontraumatic subarachnoid hemorrhage, unspecified: Secondary | ICD-10-CM | POA: Diagnosis not present

## 2021-03-06 DIAGNOSIS — I10 Essential (primary) hypertension: Secondary | ICD-10-CM | POA: Diagnosis not present

## 2021-03-06 DIAGNOSIS — M25579 Pain in unspecified ankle and joints of unspecified foot: Secondary | ICD-10-CM | POA: Diagnosis not present

## 2021-03-06 LAB — GLUCOSE, CAPILLARY
Glucose-Capillary: 135 mg/dL — ABNORMAL HIGH (ref 70–99)
Glucose-Capillary: 159 mg/dL — ABNORMAL HIGH (ref 70–99)
Glucose-Capillary: 247 mg/dL — ABNORMAL HIGH (ref 70–99)
Glucose-Capillary: 234 mg/dL — ABNORMAL HIGH (ref 70–99)

## 2021-03-06 LAB — SARS CORONAVIRUS 2 (TAT 6-24 HRS): SARS Coronavirus 2: NEGATIVE
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
## 2021-03-06 NOTE — Care Management Important Message (Signed)
Important Message  Patient Details  Name: Cassandra [name] MRN: [id number] Date of Birth: 09/10/1932   Medicare Important Message Given:  Yes     [name] P [place] 03/06/2021, 3:27 PM

## 2021-03-06 NOTE — TOC Progression Note (Signed)
Transition of Care [facility]) - Progression Note    Patient Details  Name: Cassandra [name] MRN: [id number] Date of Birth: 1933-01-30  Transition of Care [facility]) CM/SW [place], [place] Phone Number: 03/06/2021, 12:57 PM  Clinical Narrative:     CSW met with patient and spouse- informed of no bed offers  Thurmond Butts, MSW, LCSW Clinical Social Worker      Barriers to Discharge: Continued Medical Work up, SNF Pending bed offer  Expected Discharge Plan and Services   In-house Referral: Clinical Social Work                                             Social Determinants of Health (SDOH) Interventions    Readmission Risk Interventions No flowsheet data found.

## 2021-03-07 DIAGNOSIS — S82892C Other fracture of left lower leg, initial encounter for open fracture type IIIA, IIIB, or IIIC: Secondary | ICD-10-CM | POA: Diagnosis not present

## 2021-03-07 LAB — GLUCOSE, CAPILLARY
Glucose-Capillary: 132 mg/dL — ABNORMAL HIGH (ref 70–99)
Glucose-Capillary: 133 mg/dL — ABNORMAL HIGH (ref 70–99)
Glucose-Capillary: 183 mg/dL — ABNORMAL HIGH (ref 70–99)
Glucose-Capillary: 114 mg/dL — ABNORMAL HIGH (ref 70–99)
Glucose-Capillary: 92 mg/dL (ref 70–99)
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
## 2021-03-07 NOTE — TOC CAGE-AID Note (Signed)
Transition of Care [facility]) - CAGE-AID Screening   Patient Details  Name: Cassandra [name] MRN: [id number] Date of Birth: 07-03-1933  Transition of Care [facility]) CM/SW Contact:    Shanise Balch C Tarpley-Carter, [place] Phone Number: 03/07/2021, 9:26 AM   Clinical Narrative: Pt is unable to participate in Cage Aid.   Kamyrah Feeser Tarpley-Carter, MSW, LCSW-A Pronouns:  She/Her/Hers                          [place] Clinical Social WorkerTransitions of Care Cell:  [phone] [email or web address]    CAGE-AID Screening: Substance Abuse Screening unable to be completed due to: : Patient unable to participate  Have You Ever Felt You Ought to Cut Down on Your Drinking or Drug Use?: No Have People Annoyed You By Critizing Your Drinking Or Drug Use?: No Have You Felt Bad Or Guilty About Your Drinking Or Drug Use?: No Have You Ever Had a Drink or Used Drugs First Thing In The Morning to Steady Your Nerves or to Get Rid of a Hangover?: No CAGE-AID Score: 0  Substance Abuse Education Offered: No
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
## 2021-03-07 NOTE — Progress Notes (Signed)
Mobility Specialist: Progress Note   03/07/21 1729  Mobility  Activity Transferred:  Chair to bed  Level of Assistance Moderate assist, patient does 50-74%  Assistive Device Sliding board  Mobility Out of bed to chair with meals  Mobility Response Tolerated well  Mobility performed by Mobility specialist  $Mobility charge 1 Mobility   Pre-Mobility: 69 HR, 96% SpO2  Pt required min-modA to scoot using sliding board while pulling on the chuck pad to assist pt with sliding. Pt required verbal cues throughout transfer for hand placement and direction. Pt back in the bed with call bell at her side.   [facility] Qaadir Kent Mobility Specialist Mobility Specialist Phone: [phone]
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
## 2021-03-08 DIAGNOSIS — I2583 Coronary atherosclerosis due to lipid rich plaque: Secondary | ICD-10-CM | POA: Diagnosis not present

## 2021-03-08 DIAGNOSIS — S82892C Other fracture of left lower leg, initial encounter for open fracture type IIIA, IIIB, or IIIC: Secondary | ICD-10-CM | POA: Diagnosis not present

## 2021-03-08 DIAGNOSIS — I2510 Atherosclerotic heart disease of native coronary artery without angina pectoris: Secondary | ICD-10-CM | POA: Diagnosis not present

## 2021-03-08 DIAGNOSIS — I10 Essential (primary) hypertension: Secondary | ICD-10-CM | POA: Diagnosis not present

## 2021-03-08 LAB — GLUCOSE, CAPILLARY
Glucose-Capillary: 161 mg/dL — ABNORMAL HIGH (ref 70–99)
Glucose-Capillary: 122 mg/dL — ABNORMAL HIGH (ref 70–99)
Glucose-Capillary: 155 mg/dL — ABNORMAL HIGH (ref 70–99)
Glucose-Capillary: 103 mg/dL — ABNORMAL HIGH (ref 70–99)

## 2021-03-08 MED ORDER — METOPROLOL SUCCINATE ER 25 MG PO TB24
12.5000 mg | ORAL_TABLET | Freq: Every day | ORAL | Status: DC
  Administered 2021-03-09 – 2021-03-16 (×8): 12.5 mg via ORAL
  Filled 2021-03-08 (×9): qty 1

## 2021-03-08 NOTE — Progress Notes (Signed)
Mobility Specialist: Progress Note   03/08/21 1733  Mobility  Activity Transferred:  Bed to chair  Level of Assistance Moderate assist, patient does 50-74%  Assistive Device Front wheel walker  Distance Ambulated (ft) 2 ft  Mobility Out of bed to chair with meals  Mobility Response Tolerated fair  Mobility performed by Mobility specialist  Bed Position Chair  $Mobility charge 1 Mobility   Pre-Mobility: 95% SpO2  Pt independent to EOB from supine with HOB elevated and modA to stand from EOB. Pt required verbal cues for hand placement on RW as well as cues for direction. Pt was able to shuffle/hop to chair. Pt is set-up with her dinner and call bell within reach.   [facility] Jakell Trusty Mobility Specialist Mobility Specialist Phone: [phone]
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
## 2021-03-09 DIAGNOSIS — I10 Essential (primary) hypertension: Secondary | ICD-10-CM | POA: Diagnosis not present

## 2021-03-09 DIAGNOSIS — I2583 Coronary atherosclerosis due to lipid rich plaque: Secondary | ICD-10-CM | POA: Diagnosis not present

## 2021-03-09 DIAGNOSIS — I2510 Atherosclerotic heart disease of native coronary artery without angina pectoris: Secondary | ICD-10-CM | POA: Diagnosis not present

## 2021-03-09 DIAGNOSIS — S82892C Other fracture of left lower leg, initial encounter for open fracture type IIIA, IIIB, or IIIC: Secondary | ICD-10-CM | POA: Diagnosis not present

## 2021-03-09 LAB — BASIC METABOLIC PANEL
Anion gap: 7 (ref 5–15)
BUN: 15 mg/dL (ref 8–23)
CO2: 24 mmol/L (ref 22–32)
Calcium: 8.2 mg/dL — ABNORMAL LOW (ref 8.9–10.3)
Chloride: 104 mmol/L (ref 98–111)
Creatinine, Ser: 0.83 mg/dL (ref 0.44–1.00)
GFR, Estimated: >60 mL/min (ref >60)
Glucose, Bld: 129 mg/dL — ABNORMAL HIGH (ref 70–99)
Potassium: 3.1 mmol/L — ABNORMAL LOW (ref 3.5–5.1)
Sodium: 135 mmol/L (ref 135–145)

## 2021-03-09 LAB — GLUCOSE, CAPILLARY
Glucose-Capillary: 126 mg/dL — ABNORMAL HIGH (ref 70–99)
Glucose-Capillary: 140 mg/dL — ABNORMAL HIGH (ref 70–99)
Glucose-Capillary: 63 mg/dL — ABNORMAL LOW (ref 70–99)
Glucose-Capillary: 197 mg/dL — ABNORMAL HIGH (ref 70–99)
Glucose-Capillary: 184 mg/dL — ABNORMAL HIGH (ref 70–99)

## 2021-03-09 LAB — CBC
HCT: 30.2 % — ABNORMAL LOW (ref 36.0–46.0)
Hemoglobin: 9.6 g/dL — ABNORMAL LOW (ref 12.0–15.0)
MCH: 29.8 pg (ref 26.0–34.0)
MCHC: 31.8 g/dL (ref 30.0–36.0)
MCV: 93.8 fL (ref 80.0–100.0)
Platelets: 322 10*3/uL (ref 150–400)
RBC: 3.22 MIL/uL — ABNORMAL LOW (ref 3.87–5.11)
RDW: 17.5 % — ABNORMAL HIGH (ref 11.5–15.5)
WBC: 7.4 10*3/uL (ref 4.0–10.5)
nRBC: 0 % (ref 0.0–0.2)

## 2021-03-09 LAB — MAGNESIUM: Magnesium: 1.9 mg/dL (ref 1.7–2.4)

## 2021-03-09 MED ORDER — POTASSIUM CHLORIDE CRYS ER 20 MEQ PO TBCR
40.0000 meq | EXTENDED_RELEASE_TABLET | ORAL | Status: AC
  Administered 2021-03-09 (×2): 40 meq via ORAL
  Filled 2021-03-09 (×2): qty 2
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
## 2021-03-10 DIAGNOSIS — S82892C Other fracture of left lower leg, initial encounter for open fracture type IIIA, IIIB, or IIIC: Secondary | ICD-10-CM | POA: Diagnosis not present

## 2021-03-10 LAB — BASIC METABOLIC PANEL
Anion gap: 5 (ref 5–15)
BUN: 16 mg/dL (ref 8–23)
CO2: 26 mmol/L (ref 22–32)
Calcium: 8.4 mg/dL — ABNORMAL LOW (ref 8.9–10.3)
Chloride: 106 mmol/L (ref 98–111)
Creatinine, Ser: 0.77 mg/dL (ref 0.44–1.00)
GFR, Estimated: >60 mL/min (ref >60)
Glucose, Bld: 137 mg/dL — ABNORMAL HIGH (ref 70–99)
Potassium: 4 mmol/L (ref 3.5–5.1)
Sodium: 137 mmol/L (ref 135–145)

## 2021-03-10 LAB — GLUCOSE, CAPILLARY
Glucose-Capillary: 108 mg/dL — ABNORMAL HIGH (ref 70–99)
Glucose-Capillary: 108 mg/dL — ABNORMAL HIGH (ref 70–99)
Glucose-Capillary: 93 mg/dL (ref 70–99)
Glucose-Capillary: 192 mg/dL — ABNORMAL HIGH (ref 70–99)
Glucose-Capillary: 100 mg/dL — ABNORMAL HIGH (ref 70–99)

## 2021-03-10 LAB — MAGNESIUM: Magnesium: 1.9 mg/dL (ref 1.7–2.4)

## 2021-03-10 NOTE — Progress Notes (Signed)
Inpatient Rehab Admissions Coordinator:   I met with pt. To discuss potential CIR admit. Pt. Is adamant that she plans to return home and states she has 24/7 support at home. I do think caregivers would likely have difficulty managing her at her current level, so I asked TOC to contact family to discuss whether or not home discharge is actually an option for the Pt. I will follow TOC notes and re-visit conversation with Pt. If she is not able to d/f home.   Clemens Catholic, [place], [facility] Admissions Coordinator  [phone] ([place]) [phone] (office)

## 2021-03-10 NOTE — TOC Progression Note (Addendum)
Transition of Care [facility]) - Progression Note    Patient Details  Name: Cassandra [name] MRN: [id number] Date of Birth: [birth year]  [facility] CM/SW Contact  Cassandra [name], Cassandra [name] Phone Number: 03/10/2021, 9:56 AM  Clinical Narrative:     Pt still has no SNF bed offers; PT/OT have updated recs to CIR  Update 242pm SW informed by Cassandra [name], SLP, who reports pt wants to discharge home and not go to CIR or SNF  SW called pt's daughter Cassandra [name] [phone]), reporting pt returning home in current state is not an option. Pt, pt's husband, herself and MD spoke this morning regarding CIR and everyone was in agreement, pending insurance approval.   SW updated Cassandra [name], SLP  D/c Plan: CIR pending insurance approval    Barriers to Discharge: Continued Medical Work up, SNF Pending bed offer  Expected Discharge Plan and Services   In-house Referral: Clinical Social Work                                             Social Determinants of Health (SDOH) Interventions    Readmission Risk Interventions No flowsheet data found.

## 2021-03-10 NOTE — Progress Notes (Signed)
Mobility Specialist: Progress Note   03/10/21 1615  Mobility  Activity Transferred:  Chair to bed  Level of Assistance Moderate assist, patient does 50-74%  Assistive Device Sliding board  Mobility Out of bed to chair with meals  Mobility Response Tolerated well  Mobility performed by Mobility specialist  $Mobility charge 1 Mobility   Pt required frequent verbal cues for hand placement and foot positioning to slide from the chair to the bed. Pt asx throughout. Pt back to bed with call bell at her side and pt's husband present in the room.   [facility] Cassandra [name] Mobility Specialist Mobility Specialist Phone: [phone]

## 2021-03-10 NOTE — Progress Notes (Signed)
Inpatient Rehab Admissions Coordinator:    I met with pt. Again after she had discussed CIR with her husband. She states that she is interested in CIR at this time. I will open a case with her insurer.  Clemens Catholic, [place], [place] Admissions Coordinator  [phone] ([place]) [phone] (office)
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
## 2021-03-11 DIAGNOSIS — S82892C Other fracture of left lower leg, initial encounter for open fracture type IIIA, IIIB, or IIIC: Secondary | ICD-10-CM | POA: Diagnosis not present

## 2021-03-11 LAB — GLUCOSE, CAPILLARY
Glucose-Capillary: 140 mg/dL — ABNORMAL HIGH (ref 70–99)
Glucose-Capillary: 171 mg/dL — ABNORMAL HIGH (ref 70–99)
Glucose-Capillary: 113 mg/dL — ABNORMAL HIGH (ref 70–99)
Glucose-Capillary: 111 mg/dL — ABNORMAL HIGH (ref 70–99)

## 2021-03-11 NOTE — Progress Notes (Signed)
Mobility Specialist: Progress Note   03/11/21 1646  Mobility  Activity Refused mobility   Pt refused mobility stating she has been in and out of the chair today and worked on her bed exercises as well. Despite encouragement pt still refused. Will f/u as able.   [facility] [place] Mobility Specialist Mobility Specialist Phone: [phone]
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
## 2021-03-12 ENCOUNTER — Inpatient Hospital Stay (HOSPITAL_COMMUNITY): Payer: Medicare Other

## 2021-03-12 DIAGNOSIS — E7800 Pure hypercholesterolemia, unspecified: Secondary | ICD-10-CM | POA: Diagnosis not present

## 2021-03-12 DIAGNOSIS — I2583 Coronary atherosclerosis due to lipid rich plaque: Secondary | ICD-10-CM | POA: Diagnosis not present

## 2021-03-12 DIAGNOSIS — I2510 Atherosclerotic heart disease of native coronary artery without angina pectoris: Secondary | ICD-10-CM | POA: Diagnosis not present

## 2021-03-12 DIAGNOSIS — I10 Essential (primary) hypertension: Secondary | ICD-10-CM | POA: Diagnosis not present

## 2021-03-12 LAB — GLUCOSE, CAPILLARY
Glucose-Capillary: 121 mg/dL — ABNORMAL HIGH (ref 70–99)
Glucose-Capillary: 220 mg/dL — ABNORMAL HIGH (ref 70–99)
Glucose-Capillary: 98 mg/dL (ref 70–99)
Glucose-Capillary: 145 mg/dL — ABNORMAL HIGH (ref 70–99)

## 2021-03-12 LAB — CBC
HCT: 32.2 % — ABNORMAL LOW (ref 36.0–46.0)
Hemoglobin: 10.1 g/dL — ABNORMAL LOW (ref 12.0–15.0)
MCH: 29.4 pg (ref 26.0–34.0)
MCHC: 31.4 g/dL (ref 30.0–36.0)
MCV: 93.6 fL (ref 80.0–100.0)
Platelets: 436 10*3/uL — ABNORMAL HIGH (ref 150–400)
RBC: 3.44 MIL/uL — ABNORMAL LOW (ref 3.87–5.11)
RDW: 17.2 % — ABNORMAL HIGH (ref 11.5–15.5)
WBC: 6.5 10*3/uL (ref 4.0–10.5)
nRBC: 0 % (ref 0.0–0.2)

## 2021-03-12 LAB — BASIC METABOLIC PANEL
Anion gap: 7 (ref 5–15)
BUN: 16 mg/dL (ref 8–23)
CO2: 25 mmol/L (ref 22–32)
Calcium: 8.6 mg/dL — ABNORMAL LOW (ref 8.9–10.3)
Chloride: 105 mmol/L (ref 98–111)
Creatinine, Ser: 0.84 mg/dL (ref 0.44–1.00)
GFR, Estimated: >60 mL/min (ref >60)
Glucose, Bld: 180 mg/dL — ABNORMAL HIGH (ref 70–99)
Potassium: 3.7 mmol/L (ref 3.5–5.1)
Sodium: 137 mmol/L (ref 135–145)

## 2021-03-12 NOTE — Progress Notes (Signed)
Inpatient Rehab Admissions Coordinator:   I do not have insurance auth or a bed on CIR for this pt. I opened a case with her insurance Saturday and continue to await a decision. I will follow for potential admit pending insurance auth and bed availability.  Clemens Catholic, [place] Admissions Coordinator  [phone] ([place]) [phone] (office)
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
## 2021-03-12 NOTE — Progress Notes (Signed)
Orthopedic Tech Progress Note Patient Details:  Cassandra [name] 06-20-1933 [id number]  Ortho Devices Type of Ortho Device: CAM walker Ortho Device/Splint Location: LLE Ortho Device/Splint Interventions: Ordered, Application, Adjustment, Removal   Post Interventions Patient Tolerated: Well Instructions Provided: Care of device  Janit Pagan 03/12/2021, 2:17 PM

## 2021-03-12 NOTE — Progress Notes (Signed)
Mobility Specialist: Progress Note   03/12/21 1549  Mobility  Activity Ambulated in room  Level of Assistance Minimal assist, patient does 75% or more  Assistive Device Front wheel walker  Distance Ambulated (ft) 14 ft  Mobility Ambulated with assistance in room  Mobility Response Tolerated well  Mobility performed by Mobility specialist  Bed Position Chair  $Mobility charge 1 Mobility   Pre-Mobility: 60 HR, 94% SpO2  Pt required minA to stand from EOB and contact guard during ambulation. Pt was able to walk around the end of the bed to the chair with verbal cues for RW direction and safety. Pt has call bell and phone within reach and family member present in the room.   [facility] Wataru Mccowen Mobility Specialist Mobility Specialist Phone: [phone]
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
## 2021-03-12 NOTE — Progress Notes (Signed)
Mobility Specialist: Progress Note   03/12/21 1737  Mobility  Activity Ambulated in room;Transferred to/from BSC  Level of Assistance Minimal assist, patient does 75% or more  Assistive Device Front wheel walker  Distance Ambulated (ft) 14 ft  Mobility Ambulated with assistance in room  Mobility Response Tolerated well  Mobility performed by Mobility specialist  $Mobility charge 1 Mobility   Pt ambulated around the end of the bed and requested to use BSC before returning to the bed. NT assisted pt with pericare and then back to bed.   [facility] Brittony Billick Mobility Specialist Mobility Specialist Phone: [phone]

## 2021-03-12 NOTE — Care Management Important Message (Signed)
Important Message  Patient Details  Name: Cassandra [name] MRN: [id number] Date of Birth: 1933/02/05   Medicare Important Message Given:  Yes     Shelda Altes 03/12/2021, 10:09 AM
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
## 2021-03-13 DIAGNOSIS — I10 Essential (primary) hypertension: Secondary | ICD-10-CM | POA: Diagnosis not present

## 2021-03-13 LAB — GLUCOSE, CAPILLARY
Glucose-Capillary: 106 mg/dL — ABNORMAL HIGH (ref 70–99)
Glucose-Capillary: 122 mg/dL — ABNORMAL HIGH (ref 70–99)
Glucose-Capillary: 114 mg/dL — ABNORMAL HIGH (ref 70–99)
Glucose-Capillary: 108 mg/dL — ABNORMAL HIGH (ref 70–99)

## 2021-03-13 MED ORDER — MELATONIN 3 MG PO TABS
6.0000 mg | ORAL_TABLET | Freq: Every evening | ORAL | Status: DC | PRN
  Administered 2021-03-13 – 2021-03-15 (×3): 6 mg via ORAL
  Filled 2021-03-13 (×3): qty 2

## 2021-03-13 NOTE — Progress Notes (Signed)
Inpatient Rehab Admissions Coordinator:   I do not yet have insurance auth or a bed for this Pt. On CIR today. Pt. States that if denied, she does not want to wait on an appeal and wishes to d/c home with home health.   Clemens Catholic, [place], [place] Admissions Coordinator  [phone] ([place]) [phone] (office)

## 2021-03-13 NOTE — Progress Notes (Signed)
Mobility Specialist: Progress Note   03/13/21 1929  Mobility  Activity Ambulated in hall  Level of Assistance Contact guard assist, steadying assist  [place] wheel walker  Distance Ambulated (ft) 70 ft  Mobility Ambulated with assistance in hallway  Mobility Response Tolerated well  Mobility performed by Mobility specialist  $Mobility charge 1 Mobility   Pt ambulated in hallway with chair follow with rollator from RN. Pt experienced posterior LOB x1 requiring modA to recover. Pt back to bed after walk with call bell and phone at her side.   [facility] Cassandra [name] Mobility Specialist Mobility Specialist Phone: [phone]
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
## 2021-03-14 LAB — GLUCOSE, CAPILLARY
Glucose-Capillary: 96 mg/dL (ref 70–99)
Glucose-Capillary: 172 mg/dL — ABNORMAL HIGH (ref 70–99)
Glucose-Capillary: 169 mg/dL — ABNORMAL HIGH (ref 70–99)
Glucose-Capillary: 48 mg/dL — ABNORMAL LOW (ref 70–99)
Glucose-Capillary: 60 mg/dL — ABNORMAL LOW (ref 70–99)
Glucose-Capillary: 140 mg/dL — ABNORMAL HIGH (ref 70–99)
Glucose-Capillary: 217 mg/dL — ABNORMAL HIGH (ref 70–99)

## 2021-03-14 MED ORDER — CYANOCOBALAMIN 1000 MCG PO TABS: 1000.0000 ug | ORAL_TABLET | Freq: Every day | ORAL | 2 refills | Status: DC

## 2021-03-14 MED ORDER — ACETAMINOPHEN 325 MG PO TABS: 325.0000 mg | ORAL_TABLET | Freq: Four times a day (QID) | ORAL | Status: DC | PRN

## 2021-03-14 NOTE — Progress Notes (Signed)
Hypoglycemic Event  CBG: 48  Treatment: 8 oz juice/soda and blueberry muffin  Symptoms: Sweaty  Follow-up CBG: Time:1748 CBG Result: 60  Possible Reasons for Event: Unknown  Comments/MD notified: pt had dinner tray delivered at 1750 and states she feels better after the soda and muffin    [place]
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
## 2021-03-14 NOTE — Discharge Summary (Signed)
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 sclerosis is present, with no evidence of aortic valve stenosis. Aortic valve mean gradient measures 3.0 mmHg. Aortic valve peak gradient measures 6.4 mmHg. Aortic valve area, by VTI measures 2.59 cm. Pulmonic Valve: The pulmonic valve was normal in structure. Pulmonic valve regurgitation is not  visualized. Aorta: The aortic root and ascending aorta are structurally normal, with no evidence of dilitation. Venous: The inferior vena cava is dilated in size with less than 50% respiratory variability, suggesting right atrial pressure of 15 mmHg. IAS/Shunts: No atrial level shunt detected by color flow Doppler. Additional Comments: A device lead is visualized in the right ventricle.  LEFT VENTRICLE PLAX 2D LVIDd:         4.50 cm  Diastology LVIDs:         2.80 cm  LV e' medial:    7.07 cm/s LV PW:         1.30 cm  LV E/e' medial:  16.4 LV IVS:        1.20 cm  LV e' lateral:   6.09 cm/s LVOT diam:     2.00 cm  LV E/e' lateral: 19.0 LV SV:         64 LV SV Index:   36 LVOT Area:     3.14 cm  RIGHT VENTRICLE             IVC RV Basal diam:  4.10 cm     IVC diam: 2.40 cm RV S prime:     10.90 cm/s TAPSE (M-mode): 2.4 cm LEFT ATRIUM             Index       RIGHT ATRIUM           Index LA diam:        3.90 cm 2.19 cm/m  RA Area:     21.10 cm LA Vol (A2C):   68.2 ml 38.30 ml/m RA Volume:   64.50 ml  36.23 ml/m LA Vol (A4C):   75.6 ml 42.46 ml/m LA Biplane Vol: 73.0 ml 41.00 ml/m  AORTIC VALVE AV Area (Vmax):    2.49 cm AV Area (Vmean):   2.58 cm AV Area (VTI):     2.59 cm AV Vmax:  126.00 cm/s AV Vmean:          79.100 cm/s AV VTI:            0.246 m AV Peak Grad:      6.4 mmHg AV Mean Grad:      3.0 mmHg LVOT Vmax:         100.00 cm/s LVOT Vmean:        64.900 cm/s LVOT VTI:          0.203 m LVOT/AV VTI ratio: 0.83  AORTA Ao Root diam: 3.00 cm Ao Asc diam:  2.70 cm MITRAL VALVE                TRICUSPID VALVE MV Area (PHT): 3.21 cm     TR Peak grad:   38.2 mmHg MV Decel Time: 236 msec     TR Vmax:        309.00 cm/s MV E velocity: 116.00 cm/s MV A velocity: 114.00 cm/s  SHUNTS MV E/A ratio:  1.02         Systemic VTI:  0.20 m                             Systemic Diam: 2.00 cm Dani Gobble Croitoru MD Electronically signed by Sanda Klein MD Signature Date/Time: 02/27/2021/5:07:25 PM    Final     Left  Ankle Fracture ORIF   Subjective: Pt doing well.  Says her husband called last night telling her insurance appeal for CIR was successful.  She is happy to be going to rehab finally.  Denies pain, F/C, CP, SOB, N/V/D or other complaints.    Discharge Exam: Vitals:   03/14/21 0348 03/14/21 0900  BP: (!) 152/74 (!) 131/93  Pulse: 62 63  Resp: 18 20  Temp: 98 F (36.7 C) (!) 97.4 F (36.3 C)  SpO2: 98% 91%   Vitals:   03/13/21 2001 03/13/21 2338 03/14/21 0348 03/14/21 0900  BP: (!) 156/56 (!) 167/80 (!) 152/74 (!) 131/93  Pulse: 60 60 62 63  Resp: 18 18 18 20   Temp: (!) 97.5 F (36.4 C) (!) 97.5 F (36.4 C) 98 F (36.7 C) (!) 97.4 F (36.3 C)  TempSrc: Oral Oral Oral Oral  SpO2: 99% 99% 98% 91%  Weight:   75.2 kg   Height:        General: Pt is alert, awake, not in acute distress Cardiovascular: RRR, S1/S2 +, no rubs, no gallops Respiratory: CTA bilaterally, no wheezing, no rhonchi Abdominal: Soft, NT, ND, bowel sounds + Extremities: Left ankle ACE wrap in place, intact distal sensation, no edema, no cyanosis    The results of significant diagnostics from this hospitalization (including imaging, microbiology, ancillary and laboratory) are listed below for reference.     Microbiology: Recent Results (from the past 240 hour(s))  SARS CORONAVIRUS 2 (TAT 6-24 HRS) Nasopharyngeal Nasopharyngeal Swab     Status: None   Collection Time: 03/05/21 10:34 PM   Specimen: Nasopharyngeal Swab  Result Value Ref Range Status   SARS Coronavirus 2 NEGATIVE NEGATIVE Final    Comment: (NOTE) SARS-CoV-2 target nucleic acids are NOT DETECTED.  The SARS-CoV-2 RNA is generally detectable in upper and lower respiratory specimens during the acute phase of infection. Negative results do not preclude SARS-CoV-2 infection, do not rule out co-infections with other pathogens, and should not be used as the sole basis for treatment or other patient management decisions. Negative results must be  combined with clinical observations, patient history, and epidemiological information. The expected result is Negative.  Fact Sheet for Patients: SugarRoll.be  Fact Sheet for Healthcare Providers: https://www.woods-mathews.com/  This test is not yet approved or cleared by the [place] FDA and  has been authorized for detection and/or diagnosis of SARS-CoV-2 by FDA under an Emergency Use Authorization (EUA). This EUA will remain  in effect (meaning this test can be used) for the duration of the COVID-19 declaration under Se ction 564(b)(1) of the Act, 21 U.S.C. section 360bbb-3(b)(1), unless the authorization is terminated or revoked sooner.  Performed at [facility], [place] [address] [place], [place] [address]      Labs: BNP (last 3 results) No results for input(s): BNP in the last 8760 hours. Basic Metabolic Panel: Recent Labs  Lab 03/09/21 0121 03/10/21 0038 03/12/21 0037  NA 135 137 137  K 3.1* 4.0 3.7  CL 104 106 105  CO2 24 26 25   GLUCOSE 129* 137* 180*  BUN 15 16 16   CREATININE 0.83 0.77 0.84  CALCIUM 8.2* 8.4* 8.6*  MG 1.9 1.9  --    Liver Function Tests: No results for input(s): AST, ALT, ALKPHOS, BILITOT, PROT, ALBUMIN in the last 168 hours. No results for input(s): LIPASE, AMYLASE in the last 168 hours. No results for input(s): AMMONIA in the last 168 hours. CBC: Recent Labs  Lab 03/09/21 0121 03/12/21 0037  WBC 7.4 6.5  HGB 9.6* 10.1*  HCT 30.2* 32.2*  MCV 93.8 93.6  PLT 322 436*   Cardiac Enzymes: No results for input(s): CKTOTAL, CKMB, CKMBINDEX, TROPONINI in the last 168 hours. BNP: Invalid input(s): POCBNP CBG: Recent Labs  Lab 03/13/21 0629 03/13/21 1202 03/13/21 1618 03/13/21 2129 03/14/21 0621  GLUCAP 106* 122* 114* 108* 96   D-Dimer No results for input(s): DDIMER in the last 72 hours. Hgb A1c No results for input(s): HGBA1C in the last 72 hours. Lipid Profile No results  for input(s): CHOL, HDL, LDLCALC, TRIG, CHOLHDL, LDLDIRECT in the last 72 hours. Thyroid function studies No results for input(s): TSH, T4TOTAL, T3FREE, THYROIDAB in the last 72 hours.  Invalid input(s): FREET3 Anemia work up No results for input(s): VITAMINB12, FOLATE, FERRITIN, TIBC, IRON, RETICCTPCT in the last 72 hours. Urinalysis No results found for: COLORURINE, APPEARANCEUR, [place], [place], [place], [place], [place], [place], PROTEINUR, UROBILINOGEN, NITRITE, LEUKOCYTESUR Sepsis Labs Invalid input(s): PROCALCITONIN,  WBC,  LACTICIDVEN Microbiology Recent Results (from the past 240 hour(s))  SARS CORONAVIRUS 2 (TAT 6-24 HRS) Nasopharyngeal Nasopharyngeal Swab     Status: None   Collection Time: 03/05/21 10:34 PM   Specimen: Nasopharyngeal Swab  Result Value Ref Range Status   SARS Coronavirus 2 NEGATIVE NEGATIVE Final    Comment: (NOTE) SARS-CoV-2 target nucleic acids are NOT DETECTED.  The SARS-CoV-2 RNA is generally detectable in upper and lower respiratory specimens during the acute phase of infection. Negative results do not preclude SARS-CoV-2 infection, do not rule out co-infections with other pathogens, and should not be used as the sole basis for treatment or other patient management decisions. Negative results must be combined with clinical observations, patient history, and epidemiological information. The expected result is Negative.  Fact Sheet for Patients: SugarRoll.be  Fact Sheet for Healthcare Providers: https://www.woods-mathews.com/  This test is not yet approved or cleared by the [place] FDA and  has been authorized for detection and/or diagnosis of SARS-CoV-2 by FDA under an Emergency Use Authorization (EUA). This EUA will remain  in effect (meaning this test can  be used) for the duration of the COVID-19 declaration under Se ction 564(b)(1) of the Act, 21 U.S.C. section 360bbb-3(b)(1), unless the  authorization is terminated or revoked sooner.  Performed at [facility], [place] [address] [place], [place] [address]      Time coordinating discharge: Over 30 minutes  SIGNED:   Ezekiel Slocumb, DO [facility] 03/14/2021, 9:44 AM   If 7PM-7AM, please contact night-coverage [email or web address]

## 2021-03-14 NOTE — Progress Notes (Signed)
Inpatient Rehab Admissions Coordinator:   Our initial request for CIR was denied. Pt. States she wishes to appeal, so I filed her appeal this morning.   Clemens Catholic, [place], [place] Admissions Coordinator  [phone] ([place]) [phone] (office)

## 2021-03-15 LAB — GLUCOSE, CAPILLARY
Glucose-Capillary: 104 mg/dL — ABNORMAL HIGH (ref 70–99)
Glucose-Capillary: 177 mg/dL — ABNORMAL HIGH (ref 70–99)
Glucose-Capillary: 124 mg/dL — ABNORMAL HIGH (ref 70–99)
Glucose-Capillary: 72 mg/dL (ref 70–99)
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
## 2021-03-15 NOTE — Progress Notes (Signed)
Inpatient Diabetes Program Recommendations  AACE/ADA: New Consensus Statement on Inpatient Glycemic Control (2015)  Target Ranges:  Prepandial:   less than 140 mg/dL      Peak postprandial:   less than 180 mg/dL (1-2 hours)      Critically ill patients:  140 - 180 mg/dL   Lab Results  Component Value Date   GLUCAP 104 (H) 03/15/2021   HGBA1C 7.7 (H) 02/25/2021    Review of Glycemic Control Results for Cassandra [name], Cassandra [name] (MRN [id number]) as of 03/15/2021 08:47  Ref. Range 03/14/2021 06:21 03/14/2021 12:39 03/14/2021 14:54 03/14/2021 17:31 03/14/2021 17:48 03/14/2021 19:13 03/14/2021 21:21 03/15/2021 06:11  Glucose-Capillary Latest Ref Range: 70 - 99 mg/dL 96 172 (H)  Novolog 7 units 169 (H)  Novolog 3 units 48 (L) 60 (L) 140 (H) 217 (H) 104 (H)   Diabetes history: DM 2  Current orders for Inpatient glycemic control:  Lantus 14 units Novolog 0-15 units tid Novolog 4 units tid meal coverage  Inpatient Diabetes Program Recommendations:    - Note hypoglycemia yesterday due to Novolog stacking in a short period of time. Unsure why a total of 10 units of insulin was given 2 hours apart,  may need lower Novolog correction scale to 0-9 units tid  Watch on current regimen.  Thanks,  Tama Headings RN, MSN, BC-ADM Inpatient Diabetes Coordinator Team Pager [phone] (8a-5p)

## 2021-03-15 NOTE — Progress Notes (Signed)
Mobility Specialist: Progress Note   03/15/21 1806  Mobility  Activity Stood at bedside  Level of Assistance Contact guard assist, steadying assist  Assistive Device Front wheel walker  Mobility Out of bed to chair with meals  Mobility Response Tolerated well  Mobility performed by Mobility specialist  $Mobility charge 1 Mobility   Pt performed STS x10 with some standing bouts on RLE only. Pt unable to take hop steps d/t RQ being too tall. Pt back to bed after session with call bell and phone at her side.   [facility] Derrica Sieg Mobility Specialist Mobility Specialist Phone: [phone]
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
## 2021-03-15 NOTE — Care Management Important Message (Signed)
Important Message  Patient Details  Name: Cassandra [name] MRN: [id number] Date of Birth: [birth year]   Medicare Important Message Given:  Yes     Shelda Altes 03/15/2021, 11:27 AM

## 2021-03-16 ENCOUNTER — Inpatient Hospital Stay (HOSPITAL_COMMUNITY)
Admission: RE | Admit: 2021-03-16 | Discharge: 2021-03-23 | DRG: 945 | Disposition: A | Discharge status: Home / Self Care | Payer: Medicare Other | Source: Intra-hospital | Attending: Physical Medicine & Rehabilitation | Admitting: Physical Medicine & Rehabilitation

## 2021-03-16 ENCOUNTER — Encounter (HOSPITAL_COMMUNITY): Payer: Self-pay | Admitting: Physical Medicine & Rehabilitation

## 2021-03-16 ENCOUNTER — Other Ambulatory Visit: Payer: Self-pay

## 2021-03-16 ENCOUNTER — Encounter (INDEPENDENT_AMBULATORY_CARE_PROVIDER_SITE_OTHER): Payer: Self-pay | Admitting: Vascular Surgery

## 2021-03-16 ENCOUNTER — Inpatient Hospital Stay (HOSPITAL_COMMUNITY): Payer: Medicare Other

## 2021-03-16 ENCOUNTER — Inpatient Hospital Stay (HOSPITAL_COMMUNITY)
Admission: RE | Admit: 2021-03-16 | Payer: Medicare Other | Source: Intra-hospital | Admitting: Physical Medicine & Rehabilitation

## 2021-03-16 DIAGNOSIS — J449 Chronic obstructive pulmonary disease, unspecified: Secondary | ICD-10-CM | POA: Diagnosis present

## 2021-03-16 DIAGNOSIS — S82872F Displaced pilon fracture of left tibia, subsequent encounter for open fracture type IIIA, IIIB, or IIIC with routine healing: Secondary | ICD-10-CM

## 2021-03-16 DIAGNOSIS — G4733 Obstructive sleep apnea (adult) (pediatric): Secondary | ICD-10-CM | POA: Diagnosis present

## 2021-03-16 DIAGNOSIS — I10 Essential (primary) hypertension: Secondary | ICD-10-CM | POA: Diagnosis present

## 2021-03-16 DIAGNOSIS — S82832F Other fracture of upper and lower end of left fibula, subsequent encounter for open fracture type IIIA, IIIB, or IIIC with routine healing: Secondary | ICD-10-CM

## 2021-03-16 DIAGNOSIS — E785 Hyperlipidemia, unspecified: Secondary | ICD-10-CM | POA: Diagnosis present

## 2021-03-16 DIAGNOSIS — S066X9D Traumatic subarachnoid hemorrhage with loss of consciousness of unspecified duration, subsequent encounter: Secondary | ICD-10-CM | POA: Diagnosis present

## 2021-03-16 DIAGNOSIS — F419 Anxiety disorder, unspecified: Secondary | ICD-10-CM | POA: Diagnosis present

## 2021-03-16 DIAGNOSIS — D62 Acute posthemorrhagic anemia: Secondary | ICD-10-CM | POA: Diagnosis present

## 2021-03-16 DIAGNOSIS — N6310 Unspecified lump in the right breast, unspecified quadrant: Secondary | ICD-10-CM | POA: Diagnosis present

## 2021-03-16 DIAGNOSIS — Z853 Personal history of malignant neoplasm of breast: Secondary | ICD-10-CM | POA: Diagnosis not present

## 2021-03-16 DIAGNOSIS — E039 Hypothyroidism, unspecified: Secondary | ICD-10-CM | POA: Diagnosis present

## 2021-03-16 DIAGNOSIS — S066X0A Traumatic subarachnoid hemorrhage without loss of consciousness, initial encounter: Secondary | ICD-10-CM

## 2021-03-16 DIAGNOSIS — Z9181 History of falling: Secondary | ICD-10-CM

## 2021-03-16 DIAGNOSIS — Z794 Long term (current) use of insulin: Secondary | ICD-10-CM

## 2021-03-16 DIAGNOSIS — E1142 Type 2 diabetes mellitus with diabetic polyneuropathy: Secondary | ICD-10-CM | POA: Diagnosis present

## 2021-03-16 DIAGNOSIS — S066X1S Traumatic subarachnoid hemorrhage with loss of consciousness of 30 minutes or less, sequela: Secondary | ICD-10-CM | POA: Diagnosis not present

## 2021-03-16 DIAGNOSIS — I2510 Atherosclerotic heart disease of native coronary artery without angina pectoris: Secondary | ICD-10-CM | POA: Diagnosis present

## 2021-03-16 DIAGNOSIS — G4700 Insomnia, unspecified: Secondary | ICD-10-CM | POA: Diagnosis present

## 2021-03-16 DIAGNOSIS — Z9889 Other specified postprocedural states: Secondary | ICD-10-CM | POA: Diagnosis not present

## 2021-03-16 DIAGNOSIS — E119 Type 2 diabetes mellitus without complications: Secondary | ICD-10-CM

## 2021-03-16 DIAGNOSIS — Z7902 Long term (current) use of antithrombotics/antiplatelets: Secondary | ICD-10-CM

## 2021-03-16 DIAGNOSIS — Z9012 Acquired absence of left breast and nipple: Secondary | ICD-10-CM | POA: Diagnosis not present

## 2021-03-16 DIAGNOSIS — Z7982 Long term (current) use of aspirin: Secondary | ICD-10-CM

## 2021-03-16 DIAGNOSIS — S82852S Displaced trimalleolar fracture of left lower leg, sequela: Secondary | ICD-10-CM | POA: Diagnosis not present

## 2021-03-16 DIAGNOSIS — Y92410 Unspecified street and highway as the place of occurrence of the external cause: Secondary | ICD-10-CM | POA: Diagnosis not present

## 2021-03-16 DIAGNOSIS — Z79899 Other long term (current) drug therapy: Secondary | ICD-10-CM

## 2021-03-16 DIAGNOSIS — Z87891 Personal history of nicotine dependence: Secondary | ICD-10-CM

## 2021-03-16 DIAGNOSIS — S066XAA Traumatic subarachnoid hemorrhage with loss of consciousness status unknown, initial encounter: Secondary | ICD-10-CM | POA: Diagnosis present

## 2021-03-16 DIAGNOSIS — M7989 Other specified soft tissue disorders: Secondary | ICD-10-CM | POA: Diagnosis not present

## 2021-03-16 DIAGNOSIS — S9305XD Dislocation of left ankle joint, subsequent encounter: Secondary | ICD-10-CM | POA: Diagnosis not present

## 2021-03-16 DIAGNOSIS — S066X9A Traumatic subarachnoid hemorrhage with loss of consciousness of unspecified duration, initial encounter: Secondary | ICD-10-CM | POA: Diagnosis present

## 2021-03-16 LAB — CBC
HCT: 34.9 % — ABNORMAL LOW (ref 36.0–46.0)
Hemoglobin: 11.2 g/dL — ABNORMAL LOW (ref 12.0–15.0)
MCH: 29.9 pg (ref 26.0–34.0)
MCHC: 32.1 g/dL (ref 30.0–36.0)
MCV: 93.3 fL (ref 80.0–100.0)
Platelets: 331 10*3/uL (ref 150–400)
RBC: 3.74 MIL/uL — ABNORMAL LOW (ref 3.87–5.11)
RDW: 16.5 % — ABNORMAL HIGH (ref 11.5–15.5)
WBC: 6.4 10*3/uL (ref 4.0–10.5)
nRBC: 0 % (ref 0.0–0.2)

## 2021-03-16 LAB — CREATININE, SERUM
Creatinine, Ser: 0.87 mg/dL (ref 0.44–1.00)
GFR, Estimated: >60 mL/min (ref >60)

## 2021-03-16 LAB — GLUCOSE, CAPILLARY
Glucose-Capillary: 106 mg/dL — ABNORMAL HIGH (ref 70–99)
Glucose-Capillary: 141 mg/dL — ABNORMAL HIGH (ref 70–99)
Glucose-Capillary: 97 mg/dL (ref 70–99)
Glucose-Capillary: 175 mg/dL — ABNORMAL HIGH (ref 70–99)

## 2021-03-16 MED ORDER — ONDANSETRON HCL 4 MG PO TABS: 4.0000 mg | ORAL_TABLET | Freq: Four times a day (QID) | ORAL | Status: DC | PRN

## 2021-03-16 MED ORDER — QUETIAPINE FUMARATE 25 MG PO TABS
25.0000 mg | ORAL_TABLET | Freq: Every day | ORAL | Status: DC
  Administered 2021-03-16 – 2021-03-22 (×7): 25 mg via ORAL
  Filled 2021-03-16 (×7): qty 1

## 2021-03-16 MED ORDER — ACETAMINOPHEN 325 MG PO TABS: 325.0000 mg | ORAL_TABLET | Freq: Four times a day (QID) | ORAL | Status: DC | PRN

## 2021-03-16 MED ORDER — ENOXAPARIN SODIUM 40 MG/0.4ML IJ SOSY: 40.0000 mg | PREFILLED_SYRINGE | INTRAMUSCULAR | Status: DC

## 2021-03-16 MED ORDER — VITAMIN B-12 1000 MCG PO TABS
1000.0000 ug | ORAL_TABLET | Freq: Every day | ORAL | Status: DC
  Administered 2021-03-17 – 2021-03-23 (×7): 1000 ug via ORAL
  Filled 2021-03-16 (×7): qty 1

## 2021-03-16 MED ORDER — HYDROCODONE-ACETAMINOPHEN 7.5-325 MG PO TABS: 1.0000 | ORAL_TABLET | ORAL | Status: DC | PRN

## 2021-03-16 MED ORDER — INSULIN ASPART 100 UNIT/ML IJ SOLN
4.0000 [IU] | Freq: Three times a day (TID) | INTRAMUSCULAR | Status: DC
  Administered 2021-03-16 – 2021-03-23 (×16): 4 [IU] via SUBCUTANEOUS

## 2021-03-16 MED ORDER — AMITRIPTYLINE HCL 10 MG PO TABS
10.0000 mg | ORAL_TABLET | Freq: Every day | ORAL | Status: DC
  Administered 2021-03-16 – 2021-03-22 (×7): 10 mg via ORAL
  Filled 2021-03-16 (×7): qty 1

## 2021-03-16 MED ORDER — ENOXAPARIN SODIUM 40 MG/0.4ML IJ SOSY
40.0000 mg | PREFILLED_SYRINGE | INTRAMUSCULAR | Status: DC
  Administered 2021-03-16 – 2021-03-22 (×7): 40 mg via SUBCUTANEOUS
  Filled 2021-03-16 (×7): qty 0.4

## 2021-03-16 MED ORDER — POLYETHYLENE GLYCOL 3350 17 G PO PACK
17.0000 g | PACK | Freq: Two times a day (BID) | ORAL | Status: DC
  Administered 2021-03-16 – 2021-03-23 (×12): 17 g via ORAL
  Filled 2021-03-16 (×14): qty 1

## 2021-03-16 MED ORDER — DOCUSATE SODIUM 100 MG PO CAPS
100.0000 mg | ORAL_CAPSULE | Freq: Two times a day (BID) | ORAL | Status: DC
  Administered 2021-03-16 – 2021-03-23 (×14): 100 mg via ORAL
  Filled 2021-03-16 (×14): qty 1

## 2021-03-16 MED ORDER — INSULIN ASPART 100 UNIT/ML IJ SOLN
0.0000 [IU] | Freq: Three times a day (TID) | INTRAMUSCULAR | Status: DC
Start: 2021-03-16 — End: 2021-03-23
  Administered 2021-03-17 – 2021-03-20 (×3): 3 [IU] via SUBCUTANEOUS
  Administered 2021-03-20: 2 [IU] via SUBCUTANEOUS

## 2021-03-16 MED ORDER — MELATONIN 3 MG PO TABS
6.0000 mg | ORAL_TABLET | Freq: Every evening | ORAL | Status: DC | PRN
  Administered 2021-03-19 – 2021-03-22 (×3): 6 mg via ORAL
  Filled 2021-03-16 (×3): qty 2

## 2021-03-16 MED ORDER — CLOPIDOGREL BISULFATE 75 MG PO TABS
75.0000 mg | ORAL_TABLET | Freq: Every day | ORAL | Status: DC
  Administered 2021-03-17 – 2021-03-23 (×7): 75 mg via ORAL
  Filled 2021-03-16 (×7): qty 1

## 2021-03-16 MED ORDER — LEVOTHYROXINE SODIUM 25 MCG PO TABS
125.0000 ug | ORAL_TABLET | Freq: Every day | ORAL | Status: DC
  Administered 2021-03-17 – 2021-03-23 (×7): 125 ug via ORAL
  Filled 2021-03-16 (×7): qty 1

## 2021-03-16 MED ORDER — VITAMIN D 25 MCG (1000 UNIT) PO TABS
1000.0000 [IU] | ORAL_TABLET | Freq: Every day | ORAL | Status: DC
  Administered 2021-03-17 – 2021-03-23 (×7): 1000 [IU] via ORAL
  Filled 2021-03-16 (×7): qty 1

## 2021-03-16 MED ORDER — LIP MEDEX EX OINT: TOPICAL_OINTMENT | CUTANEOUS | Status: DC | PRN

## 2021-03-16 MED ORDER — ONDANSETRON HCL 4 MG/2ML IJ SOLN: 4.0000 mg | Freq: Four times a day (QID) | INTRAMUSCULAR | Status: DC | PRN

## 2021-03-16 MED ORDER — METOPROLOL SUCCINATE ER 25 MG PO TB24
12.5000 mg | ORAL_TABLET | Freq: Every day | ORAL | Status: DC
  Administered 2021-03-17 – 2021-03-23 (×7): 12.5 mg via ORAL
  Filled 2021-03-16 (×7): qty 1

## 2021-03-16 MED ORDER — IPRATROPIUM-ALBUTEROL 0.5-2.5 (3) MG/3ML IN SOLN: 3.0000 mL | RESPIRATORY_TRACT | Status: DC | PRN

## 2021-03-16 MED ORDER — INSULIN GLARGINE 100 UNIT/ML ~~LOC~~ SOLN
14.0000 [IU] | Freq: Every day | SUBCUTANEOUS | Status: DC
  Administered 2021-03-16 – 2021-03-19 (×4): 14 [IU] via SUBCUTANEOUS
  Filled 2021-03-16 (×5): qty 0.14

## 2021-03-16 MED ORDER — ROSUVASTATIN CALCIUM 5 MG PO TABS
10.0000 mg | ORAL_TABLET | Freq: Every day | ORAL | Status: DC
  Administered 2021-03-17 – 2021-03-23 (×7): 10 mg via ORAL
  Filled 2021-03-16 (×7): qty 2

## 2021-03-16 MED ORDER — PAROXETINE HCL 10 MG PO TABS
10.0000 mg | ORAL_TABLET | Freq: Every day | ORAL | Status: DC
  Administered 2021-03-17 – 2021-03-23 (×7): 10 mg via ORAL
  Filled 2021-03-16 (×7): qty 1

## 2021-03-16 MED ORDER — SUCRALFATE 1 G PO TABS
2.0000 g | ORAL_TABLET | Freq: Four times a day (QID) | ORAL | Status: DC
  Administered 2021-03-16 – 2021-03-23 (×24): 2 g via ORAL
  Filled 2021-03-16 (×29): qty 2
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
## 2021-03-16 NOTE — Progress Notes (Signed)
Patient arrived and oriented to unit routine. No distress, all questions answered.   Left lower leg has two long incisions, one medial and one lateral side. Both closed with sutures. No drainage present, skin around intact, no erythema. Changed dressing of kerlex and gauze  Two smaller incisions are above the dressing, 1-3 sutures in each. No drainage present, skin intact, no erythema.

## 2021-03-16 NOTE — Progress Notes (Signed)
IP rehab admissions - Call placed to [facility] insurance carrier who did confirm that denial for inpatient rehab had been overturned and we now have approval for CIR.  Family in agreement to inpatient rehab stay.  Bed available and will admit to CIR today.  Call for questions.  [phone]
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
## 2021-03-16 NOTE — Progress Notes (Signed)
Lower extremity venous bilateral study completed.   Please see CV Proc for preliminary results.   Ameenah Prosser, RDMS, RVT  

## 2021-03-16 NOTE — TOC Transition Note (Signed)
Transition of Care (TOC) - CM/SW Discharge Note Marvetta Gibbons RN, BSN Transitions of Care Unit 4E- RN Case Manager See Treatment Team for direct phone #    Patient Details  Name: Cassandra [name] MRN: [id number] Date of Birth: 23-Mar-1933  [facility] CM/SW Contact:  Dawayne Patricia, RN Phone Number: 03/16/2021, 2:34 PM   Clinical Narrative:    Pt has received word from her [facility] insurance that her appeal has been approved for CIR- spouse provided auth # [id number] Have spoken with Genie at [facility] who is reaching out to insurance as well to confirm that we have auth for pt to go to [facility]. If confirmed that denial has been overturned and pt has approval- CIR does have a bed to offer today and can admit later this afternoon. MD has been notified and will place d/c order.   Plan for pt to transition to [facility] today.    Final next level of care: IP Rehab Facility Barriers to Discharge: Ship broker, Barriers Resolved   Patient Goals and CMS Choice Patient states their goals for this hospitalization and ongoing recovery are:: rehab CMS Medicare.gov Compare Post Acute Care list provided to:: Patient Choice offered to / list presented to : Patient, Adult Children, Spouse  Discharge Placement               INPT rehab        Discharge Plan and Services In-house Referral: Clinical Social Work Discharge Planning Services: CM Consult Post Acute Care Choice: IP Rehab          DME Arranged: N/A DME Agency: NA       HH Arranged: PT, OT HH Agency: NA        Social Determinants of Health (SDOH) Interventions     Readmission Risk Interventions Readmission Risk Prevention Plan 03/16/2021  Transportation Screening Complete  PCP or Specialist Appt within 5-7 Days Complete  Home Care Screening Complete  Medication Review (RN CM) Complete

## 2021-03-16 NOTE — Progress Notes (Signed)
Inpatient Rehabilitation Medication Review by a Pharmacist  A complete drug regimen review was completed for this patient to identify any potential clinically significant medication issues.  Clinically significant medication issues were identified:  no  Check AMION for pharmacist assigned to patient if future medication questions/issues arise during this admission.  Pharmacist comments:   Time spent performing this drug regimen review (minutes):  5 minutes   Lolita Rieger 03/16/2021 6:49 PM

## 2021-03-17 DIAGNOSIS — E119 Type 2 diabetes mellitus without complications: Secondary | ICD-10-CM | POA: Diagnosis not present

## 2021-03-17 DIAGNOSIS — D62 Acute posthemorrhagic anemia: Secondary | ICD-10-CM | POA: Diagnosis not present

## 2021-03-17 DIAGNOSIS — S82852S Displaced trimalleolar fracture of left lower leg, sequela: Secondary | ICD-10-CM | POA: Diagnosis not present

## 2021-03-17 LAB — GLUCOSE, CAPILLARY
Glucose-Capillary: 105 mg/dL — ABNORMAL HIGH (ref 70–99)
Glucose-Capillary: 157 mg/dL — ABNORMAL HIGH (ref 70–99)
Glucose-Capillary: 111 mg/dL — ABNORMAL HIGH (ref 70–99)
Glucose-Capillary: 164 mg/dL — ABNORMAL HIGH (ref 70–99)

## 2021-03-17 NOTE — Progress Notes (Signed)
15 sutures were removed from LLE. Patient tolerated well. Idamae Schuller, LPN.
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
## 2021-03-18 LAB — GLUCOSE, CAPILLARY
Glucose-Capillary: 85 mg/dL (ref 70–99)
Glucose-Capillary: 182 mg/dL — ABNORMAL HIGH (ref 70–99)
Glucose-Capillary: 87 mg/dL (ref 70–99)
Glucose-Capillary: 156 mg/dL — ABNORMAL HIGH (ref 70–99)

## 2021-03-18 NOTE — Discharge Instructions (Addendum)
Inpatient Rehab Discharge Instructions  Adeleigh Barletta Marquina Discharge date and time: No discharge date for patient encounter.   Activities/Precautions/ Functional Status: Activity: Nonweightbearing left lower extremity for ambulation.  May bear weight for transfers only with Cam boot Diet: Diabetic diet Wound Care: Routine skin checks Functional status:  ___ No restrictions     ___ Walk up steps independently ___ 24/7 supervision/assistance   ___ Walk up steps with assistance ___ Intermittent supervision/assistance  ___ Bathe/dress independently ___ Walk with walker     _x__ Bathe/dress with assistance ___ Walk Independently    ___ Shower independently ___ Walk with assistance    ___ Shower with assistance ___ No alcohol     ___ Return to work/school ________   COMMUNITY REFERRALS UPON DISCHARGE:     Medical Equipment/Items Ordered:wheelchair and rolling walker                                                 Agency/Supplier:[employer] [id number]   Special Instructions:  No driving smoking or alcohol  Follow-up outpatient incidental findings right breast soft tissue mass  My questions have been answered and I understand these instructions. I will adhere to these goals and the provided educational materials after my discharge from the hospital.  Patient/Caregiver Signature _______________________________ Date __________  Clinician Signature _______________________________________ Date __________  Please bring this form and your medication list with you to all your follow-up doctor's appointments.

## 2021-03-19 DIAGNOSIS — S82852S Displaced trimalleolar fracture of left lower leg, sequela: Secondary | ICD-10-CM | POA: Diagnosis not present

## 2021-03-19 DIAGNOSIS — D62 Acute posthemorrhagic anemia: Secondary | ICD-10-CM

## 2021-03-19 DIAGNOSIS — E119 Type 2 diabetes mellitus without complications: Secondary | ICD-10-CM | POA: Diagnosis not present

## 2021-03-19 LAB — CBC WITH DIFFERENTIAL/PLATELET
Abs Immature Granulocytes: 0.03 10*3/uL (ref 0.00–0.07)
Basophils Absolute: 0.1 10*3/uL (ref 0.0–0.1)
Basophils Relative: 1 %
Eosinophils Absolute: 0.3 10*3/uL (ref 0.0–0.5)
Eosinophils Relative: 5 %
HCT: 36.5 % (ref 36.0–46.0)
Hemoglobin: 11.5 g/dL — ABNORMAL LOW (ref 12.0–15.0)
Immature Granulocytes: 0 %
Lymphocytes Relative: 22 %
Lymphs Abs: 1.6 10*3/uL (ref 0.7–4.0)
MCH: 29.5 pg (ref 26.0–34.0)
MCHC: 31.5 g/dL (ref 30.0–36.0)
MCV: 93.6 fL (ref 80.0–100.0)
Monocytes Absolute: 0.6 10*3/uL (ref 0.1–1.0)
Monocytes Relative: 8 %
Neutro Abs: 4.5 10*3/uL (ref 1.7–7.7)
Neutrophils Relative %: 64 %
Platelets: 271 10*3/uL (ref 150–400)
RBC: 3.9 MIL/uL (ref 3.87–5.11)
RDW: 16.6 % — ABNORMAL HIGH (ref 11.5–15.5)
WBC: 7.1 10*3/uL (ref 4.0–10.5)
nRBC: 0 % (ref 0.0–0.2)

## 2021-03-19 LAB — COMPREHENSIVE METABOLIC PANEL
ALT: 9 U/L (ref 0–44)
AST: 17 U/L (ref 15–41)
Albumin: 2.9 g/dL — ABNORMAL LOW (ref 3.5–5.0)
Alkaline Phosphatase: 87 U/L (ref 38–126)
Anion gap: 8 (ref 5–15)
BUN: 14 mg/dL (ref 8–23)
CO2: 25 mmol/L (ref 22–32)
Calcium: 8.6 mg/dL — ABNORMAL LOW (ref 8.9–10.3)
Chloride: 106 mmol/L (ref 98–111)
Creatinine, Ser: 1.02 mg/dL — ABNORMAL HIGH (ref 0.44–1.00)
GFR, Estimated: 54 mL/min — ABNORMAL LOW (ref >60)
Glucose, Bld: 244 mg/dL — ABNORMAL HIGH (ref 70–99)
Potassium: 3.3 mmol/L — ABNORMAL LOW (ref 3.5–5.1)
Sodium: 139 mmol/L (ref 135–145)
Total Bilirubin: 0.7 mg/dL (ref 0.3–1.2)
Total Protein: 6.1 g/dL — ABNORMAL LOW (ref 6.5–8.1)

## 2021-03-19 LAB — GLUCOSE, CAPILLARY
Glucose-Capillary: 94 mg/dL (ref 70–99)
Glucose-Capillary: 86 mg/dL (ref 70–99)
Glucose-Capillary: 60 mg/dL — ABNORMAL LOW (ref 70–99)
Glucose-Capillary: 184 mg/dL — ABNORMAL HIGH (ref 70–99)
Glucose-Capillary: 204 mg/dL — ABNORMAL HIGH (ref 70–99)
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
## 2021-03-19 NOTE — Progress Notes (Signed)
Physical Therapy TBI Note  Patient Details  Name: Cassandra [name] MRN: [id number] Date of Birth: October 14, 1932  Today's Date: 03/19/2021 PT Individual Time: 4765-4650 PT Individual Time Calculation (min): 38 min   Short Term Goals: Week 1:  PT Short Term Goal 1 (Week 1): STG=LTG due to ELOS.  Skilled Therapeutic Interventions/Progress Updates:   Received pt sitting in WC with R CAM boot on, pt agreeable to PT treatment, and denied any pain during session. Session with emphasis on functional mobility/transfers, generalized strengthening, and improved activity tolerance. Pt performed WC mobility 125ft using BUE and supervision to dayroom and transferred to/from mat via stand<>pivot with RW and min A. Noted pt unable to use UE's to lift body up to attempt to "hop" on RLE; therefore did not trial gait this session. Pt performed the following exercises sitting EOM with supervision and verbal cues for technique and for eccentric control as pt frequently rushing through exercises: -hip flexion 2x15 bilaterally with 2lb ankle weight -LAQ 2x15 bilaterally with 2lb ankle weight -hip adduction ball squeezes 10x5 second isometric hold -overhead chest press with 4lb dowel 2x12 -horizontal chest press at 90 degrees with 4lb dowel 2x12 -seated trunk rotation with 6lb medicine ball x10 bilaterally -bicep curls 2x15 with 6lb dowel Pt transported back to room in [facility] total A. Concluded session with pt sitting in WC, needs within reach, and chair pad alarm on. NT notified of pt's request to toilet.   Therapy Documentation Precautions:  Precautions Precautions: Fall Precaution Comments: cam boot for transfers Required Braces or Orthoses: Other Brace Restrictions Weight Bearing Restrictions: Yes LLE Weight Bearing: Partial weight bearing Other Position/Activity Restrictions: NWB L LE for ambulation, ok for WB some with transfers ONLY; can take boot off for sleeping and for ankle rom  Agitated Behavior  Scale: TBI Observation Details Observation Environment: gym Start of observation period - Date: 03/19/21 Start of observation period - Time: 0915 End of observation period - Date: 03/19/21 End of observation period - Time: 1000 Agitated Behavior Scale (DO NOT LEAVE BLANKS) Short attention span, easy distractibility, inability to concentrate: Absent Impulsive, impatient, low tolerance for pain or frustration: Absent Uncooperative, resistant to care, demanding: Absent Violent and/or threatening violence toward people or property: Absent Explosive and/or unpredictable anger: Absent Rocking, rubbing, moaning, or other self-stimulating behavior: Absent Pulling at tubes, restraints, etc.: Absent Wandering from treatment areas: Absent Restlessness, pacing, excessive movement: Absent Repetitive behaviors, motor, and/or verbal: Absent Rapid, loud, or excessive talking: Absent Sudden changes of mood: Absent Easily initiated or excessive crying and/or laughter: Absent Self-abusiveness, physical and/or verbal: Absent Agitated behavior scale total score: 14  Therapy/Group: Individual Therapy Alfonse Alpers PT, DPT   03/19/2021, 7:31 AM

## 2021-03-19 NOTE — Progress Notes (Signed)
Inpatient Rehabilitation  Patient information reviewed and entered into eRehab system by Deretha Ertle M. Maddyson Keil, M.A., CCC/SLP, PPS Coordinator.  Information including medical coding, functional ability and quality indicators will be reviewed and updated through discharge.    

## 2021-03-19 NOTE — IPOC Note (Signed)
Overall Plan of Care [facility]) Patient Details Name: Cassandra [name] MRN: [id number] DOB: 14-Jun-1933  Admitting Diagnosis: Left trimalleolar fracture, sequela  Hospital Problems: Principal Problem:   Left trimalleolar fracture, sequela Active Problems:   Traumatic subarachnoid hemorrhage ([place])     Functional Problem List: Nursing Behavior, Edema, Endurance, Medication Management, Perception, Safety, Skin Integrity  PT Balance, Behavior, Endurance, Motor, Safety, Sensory, Skin Integrity  OT Balance, Endurance, Pain, Safety  SLP    TR         Basic ADL's: OT Grooming, Bathing, Dressing, Toileting     Advanced  ADL's: OT Simple Meal Preparation     Transfers: PT Bed Mobility, Bed to Chair, Teacher, early years/pre, Tub/Shower     Locomotion: PT Ambulation, Emergency planning/management officer, Stairs     Additional Impairments: OT None  SLP        TR      Anticipated Outcomes Item Anticipated Outcome  Self Feeding no goal  Swallowing      Basic self-care  S  Toileting  S   Bathroom Transfers S  Bowel/Bladder  N/A  Transfers  supervision  Locomotion  nonambulatory due to wbing restrictions  Communication     Cognition     Pain  N/A  Safety/Judgment  min assist and no falls   Therapy Plan: PT Intensity: Minimum of 1-2 x/day ,45 to 90 minutes PT Frequency: 5 out of 7 days PT Duration Estimated Length of Stay: 7-10 days OT Intensity: Minimum of 1-2 x/day, 45 to 90 minutes OT Frequency: 5 out of 7 days OT Duration/Estimated Length of Stay: 7-10     Due to the current state of emergency, patients may not be receiving their 3-hours of Medicare-mandated therapy.   Team Interventions: Nursing Interventions Patient/Family Education, Medication Management, Skin Care/Wound Management, Discharge Planning, Psychosocial Support  PT interventions DME/adaptive equipment instruction, Psychosocial support, UE/LE Strength taining/ROM, Wheelchair propulsion/positioning,  Training and development officer, Discharge planning, Therapeutic Activities, UE/LE Coordination activities, Functional mobility training, Patient/family education, Therapeutic Exercise  OT Interventions Balance/vestibular training, Discharge planning, Pain management, Therapeutic Activities, UE/LE Coordination activities, Visual/perceptual remediation/compensation, Therapeutic Exercise, Skin care/wound managment, Self Care/advanced ADL retraining, Patient/family education, Functional mobility training, Disease mangement/prevention, Community reintegration, Engineer, drilling, Neuromuscular re-education, Psychosocial support, Splinting/orthotics, UE/LE Strength taining/ROM, Wheelchair propulsion/positioning  SLP Interventions    TR Interventions    SW/CM Interventions Patient/Family Education, Psychosocial Support, Discharge Planning   Barriers to Discharge MD  Medical stability  Nursing Decreased caregiver support, Home environment access/layout, Wound Care, Lack of/limited family support, Weight bearing restrictions, Medication compliance, Behavior 1 level home, 4 steps to enter with right and left hand rail. Lives with spouse who can provide 24/7 care at discharge.  PT Weight bearing restrictions, Behavior mildly impulsive by nature, hx of falls  OT Incontinence, Weight bearing restrictions    SLP      SW       Team Discharge Planning: Destination: PT-Home ,OT- Home , SLP-  Projected Follow-up: PT-Home health PT, OT-  Home health OT, SLP-  Projected Equipment Needs: PT-Tub/shower bench, Wheelchair (measurements), OT- Tub/shower bench, SLP-  Equipment Details: PT-will need home measurements to aid in wc ordering, OT-pt has BSC and RW Patient/family involved in discharge planning: PT- Patient,  OT-Patient, Family member/caregiver, SLP-   MD ELOS: 7-10 days Medical Rehab Prognosis:  Excellent Assessment: The patient has been admitted for CIR therapies with the diagnosis of  traumatic SAH and left ankle fracture. The team will be addressing functional mobility, strength, stamina, balance, safety,  adaptive techniques and equipment, self-care, bowel and bladder mgt, patient and caregiver education, ortho precautions, pain mgt, wound care. Goals have been set at supervision for basic self-care and transfers, w/c mobility.   Due to the current state of emergency, patients may not be receiving their 3 hours per day of Medicare-mandated therapy.    Meredith Staggers, MD, FAAPMR     See Team Conference Notes for weekly updates to the plan of care

## 2021-03-19 NOTE — Care Management (Signed)
[facility] Individual Statement of Services  Patient Name:  Cassandra [name]  Date:  03/19/2021  Welcome to the [facility].  Our goal is to provide you with an individualized program based on your diagnosis and situation, designed to meet your specific needs.  With this comprehensive rehabilitation program, you will be expected to participate in at least 3 hours of rehabilitation therapies Monday-Friday, with modified therapy programming on the weekends.  Your rehabilitation program will include the following services:  Physical Therapy (PT), Occupational Therapy (OT), 24 hour per day rehabilitation nursing, Therapeutic Recreaction (TR), Psychology, Neuropsychology, Care Coordinator, Rehabilitation Medicine, [place], and Other  Weekly team conferences will be held on Tuesdays to discuss your progress.  Your Inpatient Rehabilitation Care Coordinator will talk with you frequently to get your input and to update you on team discussions.  Team conferences with you and your family in attendance may also be held.  Expected length of stay: 7-10 days    Overall anticipated outcome: Supervision  Depending on your progress and recovery, your program may change. Your Inpatient Rehabilitation Care Coordinator will coordinate services and will keep you informed of any changes. Your Inpatient Rehabilitation Care Coordinator's name and contact numbers are listed  below.  The following services may also be recommended but are not provided by the [facility] will be made to provide these services after discharge if needed.  Arrangements include referral to agencies that provide these services.  Your insurance has been verified to be:  [facility] Medicare  Your primary doctor is:  Fulton Reek  Pertinent information will be shared with your doctor and your insurance company.  Inpatient Rehabilitation Care Coordinator:  Cathleen Corti [phone] or [phone]  Information discussed with and copy given to patient by: Rana Snare, 03/19/2021, 11:18 AM

## 2021-03-19 NOTE — Progress Notes (Signed)
Occupational Therapy TBI Note  Patient Details  Name: Cassandra [name] MRN: [id number] Date of Birth: Oct 28, 1932  Today's Date: 03/19/2021 OT Individual Time: 2010-0712 OT Individual Time Calculation (min): 55 min    Short Term Goals: Week 1:  OT Short Term Goal 1 (Week 1): STG=LTG d/t ELOS  Skilled Therapeutic Interventions/Progress Updates:    Pt resting in w/c upon arrival and agreeable to therapy. Pt engaged in [place] activities: Visual scanning-1.45 secs Sequencing-3.15 secs Alternating sequencing-6.50 secs with mod verbal cues Bell cancellation-1 miss and 6 distractors  Pt engaged in colored peg board task. Unable to complete diagonal pattern. Completed cross pattern wit min verbal cues.  Pt able to verbalize WB precautions.  Pt returned to room and remained in w/c with all needs within reach and belt alarm activated.   Therapy Documentation Precautions:  Precautions Precautions: Fall Precaution Comments: cam boot for transfers Required Braces or Orthoses: Other Brace Restrictions Weight Bearing Restrictions: Yes LLE Weight Bearing: Partial weight bearing Other Position/Activity Restrictions: NWB L LE for ambulation, ok for WB some with transfers ONLY; can take boot off for sleeping and for ankle rom  Pain:  Pt denies pain this morning Agitated Behavior Scale: TBI Observation Details Observation Environment: gym Start of observation period - Date: 03/19/21 Start of observation period - Time: 0915 End of observation period - Date: 03/19/21 End of observation period - Time: 1000 Agitated Behavior Scale (DO NOT LEAVE BLANKS) Short attention span, easy distractibility, inability to concentrate: Absent Impulsive, impatient, low tolerance for pain or frustration: Absent Uncooperative, resistant to care, demanding: Absent Violent and/or threatening violence toward people or property: Absent Explosive and/or unpredictable anger: Absent Rocking, rubbing, moaning, or  other self-stimulating behavior: Absent Pulling at tubes, restraints, etc.: Absent Wandering from treatment areas: Absent Restlessness, pacing, excessive movement: Absent Repetitive behaviors, motor, and/or verbal: Absent Rapid, loud, or excessive talking: Absent Sudden changes of mood: Absent Easily initiated or excessive crying and/or laughter: Absent Self-abusiveness, physical and/or verbal: Absent Agitated behavior scale total score: 14    Therapy/Group: Individual Therapy  Leroy Libman 03/19/2021, 12:45 PM

## 2021-03-19 NOTE — Progress Notes (Signed)
Hypoglycemic Event  CBG: 60  Treatment: 8 oz juice/soda  Symptoms: None  Follow-up CBG: Time:1740 CBG Result:184  Possible Reasons for Event: Unknown    Cassandra [name]

## 2021-03-19 NOTE — Progress Notes (Signed)
Physical Therapy TBI Note  Patient Details  Name: Cassandra [name] MRN: [id number] Date of Birth: 1932-09-27  Today's Date: 03/19/2021 PT Individual Time: 0800-0830 PT Individual Time Calculation (min): 30 min   Short Term Goals: Week 1:  PT Short Term Goal 1 (Week 1): STG=LTG due to ELOS.  Skilled Therapeutic Interventions/Progress Updates:     Patient sitting EOB with LPN in the room upon PT arrival. Patient alert and agreeable to PT session. Patient denied pain during session.  Patient able to recall weight bearing precautions and appropriate use of CAM boot for weight bearing with transfers only with only prompting.   Therapeutic Activity: Patient doffed her pajama top and donned a shirt with set-up assist while seated EOB independent with sitting balance.  Transfers: Donned CAM boot on her L foot with total A and educated patient on placement of heel and anterior piece for improved positioning in the brace. Patient performed stand pivot bed>w/c and sit to/from stand x2 with min A-CGA using RW with CAM boot donned. Provided verbal cues for hand placement on RW x1, forward weight shift and trialing NWB on L LE during transfer with poor success, required minimal weight bearing through CAM boot with all transfers. Patient performed hops in place on R foot with L foot NWB x3 with cues for use of upper extremities to lift her foot up rather than jumping with poor clearance of R foot. Lowered RW for improved leverage with upper extremities, but limited by time for trials following adjustment.   Discussed d/c planning, home set-up, PT goals, and ELOS with patient during session. Patient has a small bathroom that will not accommodate a w/c, but can be accessed by RW, will work toward short ambulation goal for bathroom access. Patient reports a ramp is being built for home entry starting tomorrow and that her home would be w/c accessible aside from the bathroom.   Patient in w/c in the room at  end of session with breaks locked, chair alarm set, and all needs within reach.   Therapy Documentation Precautions:  Precautions Precautions: Fall Precaution Comments: cam boot for transfers Required Braces or Orthoses: Other Brace Restrictions Weight Bearing Restrictions: Yes LLE Weight Bearing: Partial weight bearing Other Position/Activity Restrictions: NWB L LE for ambulation, ok for WB some with transfers ONLY; can take boot off for sleeping and for ankle rom Agitated Behavior Scale: TBI Observation Details Observation Environment: gym Start of observation period - Date: 03/19/21 Start of observation period - Time: 0800 End of observation period - Date: 03/19/21 End of observation period - Time: 0830 Agitated Behavior Scale (DO NOT LEAVE BLANKS) Short attention span, easy distractibility, inability to concentrate: Absent Impulsive, impatient, low tolerance for pain or frustration: Absent Uncooperative, resistant to care, demanding: Absent Violent and/or threatening violence toward people or property: Absent Explosive and/or unpredictable anger: Absent Rocking, rubbing, moaning, or other self-stimulating behavior: Absent Pulling at tubes, restraints, etc.: Absent Wandering from treatment areas: Absent Restlessness, pacing, excessive movement: Absent Repetitive behaviors, motor, and/or verbal: Absent Rapid, loud, or excessive talking: Absent Sudden changes of mood: Absent Easily initiated or excessive crying and/or laughter: Absent Self-abusiveness, physical and/or verbal: Absent Agitated behavior scale total score: 14    Therapy/Group: Individual Therapy  Eveline Sauve L Callahan Wild PT, DPT  03/19/2021, 12:00 PM

## 2021-03-19 NOTE — Progress Notes (Signed)
Occupational Therapy TBI Note  Patient Details  Name: Cassandra [name] MRN: [id number] Date of Birth: 1933/03/14  Today's Date: 03/19/2021 OT Individual Time: 1345-1445 OT Individual Time Calculation (min): 60 min    Short Term Goals: Week 1:  OT Short Term Goal 1 (Week 1): STG=LTG d/t ELOS  Skilled Therapeutic Interventions/Progress Updates:   Pt received sitting up in the w/c with her husband present, reporting she is "worn out" from day of therapy. No pain reported. Pt agreeable to take shower. She completed stand pivot transfer with CGA using RW into the walk in shower, with min cueing provided for sequencing/technique. Occluded LLE. While pt showered, discussed previous falls and ongoing fall prevention education. She required min cueing for strategies in shower to increase ease and independence, pt politely disregards much education. She required CGA for standing level peri hygiene. She donned shirt with set up assist, pants and depends with CGA. She required min A to don cam boot. Pt returned to her w/c, CGA. Grooming tasks with set up assist. Pt completed 200 ft of w/c propulsion to the tub room to discuss TTB vs shower chair as there was some confusion between her and the CSW on what was being recommended. Pt and OT in agreement that TTB is best for safety, ease, and adherence to current WB precautions. Pt returned to her room and transferred back to bed. Pt left supine with all needs met, bed alarm set. 15 min missed d/t fatigue.   Once in the tub room pt brought her hand to OT's attention, stating she had bumped it on the w/c and there was now an obvious darkened spot under the skin. No bleeding and pt stated it did not hurt. RN notified.   Therapy Documentation Precautions:  Precautions Precautions: Fall Precaution Comments: cam boot for transfers Required Braces or Orthoses: Other Brace Restrictions Weight Bearing Restrictions: Yes LLE Weight Bearing: Partial weight  bearing Other Position/Activity Restrictions: NWB L LE for ambulation, ok for WB some with transfers ONLY; can take boot off for sleeping and for ankle rom    Agitated Behavior Scale: TBI Observation Details Observation Environment: Pt room Start of observation period - Date: 03/19/21 Start of observation period - Time: 1345 End of observation period - Date: 03/19/21 End of observation period - Time: 1445 Agitated Behavior Scale (DO NOT LEAVE BLANKS) Short attention span, easy distractibility, inability to concentrate: Absent Impulsive, impatient, low tolerance for pain or frustration: Absent Uncooperative, resistant to care, demanding: Absent Violent and/or threatening violence toward people or property: Absent Explosive and/or unpredictable anger: Absent Rocking, rubbing, moaning, or other self-stimulating behavior: Absent Pulling at tubes, restraints, etc.: Absent Wandering from treatment areas: Absent Restlessness, pacing, excessive movement: Absent Repetitive behaviors, motor, and/or verbal: Absent Rapid, loud, or excessive talking: Absent Sudden changes of mood: Absent Easily initiated or excessive crying and/or laughter: Absent Self-abusiveness, physical and/or verbal: Absent Agitated behavior scale total score: 14    Therapy/Group: Individual Therapy  Curtis Sites 03/19/2021, 2:48 PM

## 2021-03-20 DIAGNOSIS — S82852S Displaced trimalleolar fracture of left lower leg, sequela: Secondary | ICD-10-CM | POA: Diagnosis not present

## 2021-03-20 DIAGNOSIS — D62 Acute posthemorrhagic anemia: Secondary | ICD-10-CM | POA: Diagnosis not present

## 2021-03-20 DIAGNOSIS — E119 Type 2 diabetes mellitus without complications: Secondary | ICD-10-CM | POA: Diagnosis not present

## 2021-03-20 LAB — GLUCOSE, CAPILLARY
Glucose-Capillary: 35 mg/dL — CL (ref 70–99)
Glucose-Capillary: 90 mg/dL (ref 70–99)
Glucose-Capillary: 129 mg/dL — ABNORMAL HIGH (ref 70–99)
Glucose-Capillary: 177 mg/dL — ABNORMAL HIGH (ref 70–99)
Glucose-Capillary: 155 mg/dL — ABNORMAL HIGH (ref 70–99)

## 2021-03-20 MED ORDER — INSULIN GLARGINE 100 UNIT/ML ~~LOC~~ SOLN
12.0000 [IU] | Freq: Every day | SUBCUTANEOUS | Status: DC
  Administered 2021-03-20 – 2021-03-22 (×3): 12 [IU] via SUBCUTANEOUS
  Filled 2021-03-20 (×4): qty 0.12

## 2021-03-20 NOTE — Progress Notes (Signed)
Physical Therapy TBI Note  Patient Details  Name: Cassandra [name] MRN: [id number] Date of Birth: 1933/01/21  Today's Date: 03/20/2021 PT Individual Time: 1030-1130 PT Individual Time Calculation (min): 60 min   Short Term Goals: Week 1:  PT Short Term Goal 1 (Week 1): STG=LTG due to ELOS.  Skilled Therapeutic Interventions/Progress Updates:    Patient in w/c and requesting to toilet.  Propelled to door of bathroom and assisted up threshold.  Patient stand step to toilet with grabbar and CGA.  Performed clothing management and hygiene with CGA.  Patient washed hands at sink in sitting.  Propelled w/c with close S x about 130'.  Transfer with RW stand step to mat with CGA.  Patient seated for LAQ w/ 4 # on R, hip flexion 2x 10 each,  Sit to sidelying with S and performed hip abduction 2 x 10 on each side with S for rolling. Supine half bridge on R 2 x 10 reps Supine to sit with S.  Patient sit to stand to RW with CGA and took about 3 steps but unable to keep L NWB so chair behind and pt seated.  Patient sit<>stand with CGA to RW with cues for hand placement to reach for and toss bean bags to cornhole x 6 bean bags x 5 reps for work on dynamic standing balance with L foot on floor but weight shifted R and at least 1 UE support.  Patient assisted in w/c to ortho gym.  Performed car transfer to simulated sedan height with CGA using RW.  Patient assisted in w/c to room.  Left with call bell and needs in reach and seat alarm active.   Therapy Documentation Precautions:  Precautions Precautions: Fall Precaution Comments: cam boot for transfers Required Braces or Orthoses: Other Brace Restrictions Weight Bearing Restrictions: Yes LLE Weight Bearing: Partial weight bearing Other Position/Activity Restrictions: NWB L LE for ambulation, ok for WB some with transfers ONLY; can take boot off for sleeping and for ankle rom  Pain: Pain Assessment Pain Scale: 0-10 Pain Score: 0-No pain Agitated  Behavior Scale: TBI Observation Details Observation Environment: CIR Start of observation period - Date: 03/20/21 Start of observation period - Time: 1030 End of observation period - Date: 03/20/21 End of observation period - Time: 1130 Agitated Behavior Scale (DO NOT LEAVE BLANKS) Short attention span, easy distractibility, inability to concentrate: Absent Impulsive, impatient, low tolerance for pain or frustration: Absent Uncooperative, resistant to care, demanding: Absent Violent and/or threatening violence toward people or property: Absent Explosive and/or unpredictable anger: Absent Rocking, rubbing, moaning, or other self-stimulating behavior: Absent Pulling at tubes, restraints, etc.: Absent Wandering from treatment areas: Absent Restlessness, pacing, excessive movement: Absent Repetitive behaviors, motor, and/or verbal: Absent Rapid, loud, or excessive talking: Absent Sudden changes of mood: Absent Easily initiated or excessive crying and/or laughter: Absent Self-abusiveness, physical and/or verbal: Absent Agitated behavior scale total score: 14   Therapy/Group: Individual Therapy  Reginia Naas Magda Kiel, PT 03/20/2021, 12:44 PM

## 2021-03-20 NOTE — Progress Notes (Signed)
Occupational Therapy Session Note  Patient Details  Name: Cassandra [name] MRN: [id number] Date of Birth: 1932/10/20  Today's Date: 03/20/2021 OT Individual Time: 1345-1440 OT Individual Time Calculation (min): 55 min    Short Term Goals: Week 1:  OT Short Term Goal 1 (Week 1): STG=LTG d/t ELOS  Skilled Therapeutic Interventions/Progress Updates:    Patient seated in recliner, ready for afternoon therapy session.  She denies pain.  SPT with RW to/from w/c, toilet, mat table with CGA - occ cues for safety/sequencing.  Toileting completed CGA/CS.   Completed seated core strengthening, UB conditioning, trunk mobility, seated balance activities mat level.  Completed standing with left LE NWB ROM exercises CGA with brief rest break between exercises.   Design copy task 10/14 first attempt - was able to fix 4 incorrect pieces with min cues.   She returned to bed at close of session with CGA.   She is pleasant and cooperative t/o session.  ABS 14.  Call bell in reach and bed alarm set.    Therapy Documentation Precautions:  Precautions Precautions: Fall Precaution Comments: cam boot for transfers Required Braces or Orthoses: Other Brace Restrictions Weight Bearing Restrictions: Yes LLE Weight Bearing: Partial weight bearing Other Position/Activity Restrictions: NWB L LE for ambulation, ok for WB some with transfers ONLY; can take boot off for sleeping and for ankle rom   Therapy/Group: Individual Therapy  Carlos Levering 03/20/2021, 7:49 AM

## 2021-03-20 NOTE — Progress Notes (Signed)
Occupational Therapy TBI Note  Patient Details  Name: Cassandra [name] MRN: [id number] Date of Birth: [birth year]  Today's Date: 03/20/2021 OT Individual Time: 4621-9471 OT Individual Time Calculation (min): 60 min   Session 2: OT Individual Time: 1130-1200 OT Individual Time Calculation (min): 30 min    Short Term Goals: Week 1:  OT Short Term Goal 1 (Week 1): STG=LTG d/t ELOS  Skilled Therapeutic Interventions/Progress Updates:    Pt sitting EOB with no c/o pain. Pt completed grooming tasks EOB with mod I. EOB pt completed L ankle AROM exercises- ankle pumps and ankle circles. No c/o pain. Pt completed stand pivot transfer to the w/c with CGA. She propelled w/c 200 ft with several brief rest breaks and min cueing for technique. Pt initiated BITs but then reported urgent BM needs. She completed toilet transfer with CGA and all toileting tasks with CGA. Pt returned to the therapy gym and completed BITS activities focused on cognitive retraining (working memory, selective attention, and sequencing).  In standing pt completed rotator sequence- 85.71% accurate, 4.07 sec reaction time. Pt reported this was a great improvement from yesterday's performance with other OT. Pt returned to her room and was left sitting up with all needs met, chair alarm set.    Session 2: Pt received sitting in the w/c with no c/o pain, reporting fatigue from previous sessions but agreeable to session. Discussed d/c on Friday and reviewed preliminary d/c education. Pt completed w/c propulsion 100 ft before OT assisted remainder of the way to the therapy gym. She completed 3 min on the BUE ergometer at level 8.0 before requiring a rest break. Second 3 min trial backward with similar performance and c/o fatigue following. Pt returned to her room and was left sitting up with all needs met, chair alarm set.   Therapy Documentation Precautions:  Precautions Precautions: Fall Precaution Comments: cam boot for  transfers Required Braces or Orthoses: Other Brace Restrictions Weight Bearing Restrictions: Yes LLE Weight Bearing: Partial weight bearing Other Position/Activity Restrictions: NWB L LE for ambulation, ok for WB some with transfers ONLY; can take boot off for sleeping and for ankle rom  Agitated Behavior Scale: TBI Observation Details Observation Environment: CIR Start of observation period - Date: 03/20/21 Start of observation period - Time: 0730 End of observation period - Date: 03/20/21 End of observation period - Time: 0830 Agitated Behavior Scale (DO NOT LEAVE BLANKS) Short attention span, easy distractibility, inability to concentrate: Absent Impulsive, impatient, low tolerance for pain or frustration: Absent Uncooperative, resistant to care, demanding: Absent Violent and/or threatening violence toward people or property: Absent Explosive and/or unpredictable anger: Absent Rocking, rubbing, moaning, or other self-stimulating behavior: Absent Pulling at tubes, restraints, etc.: Absent Wandering from treatment areas: Absent Restlessness, pacing, excessive movement: Absent Repetitive behaviors, motor, and/or verbal: Absent Rapid, loud, or excessive talking: Absent Sudden changes of mood: Absent Easily initiated or excessive crying and/or laughter: Absent Self-abusiveness, physical and/or verbal: Absent Agitated behavior scale total score: 14   Therapy/Group: Individual Therapy  Curtis Sites 03/20/2021, 11:56 AM

## 2021-03-20 NOTE — Progress Notes (Signed)
Patient sustained a bruise to the posterior left hand that occurred during occupational therapy yesterday. The skin is intact, no c/o pain. Patient states that it's nothing & happens because she is on blood thinners. Provider was informed. No distress noted.
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
## 2021-03-20 NOTE — Progress Notes (Signed)
Patient ID: Cassandra [name], female   DOB: 1933-01-15, 87 y.o.   MRN: [id number]  SW met with pt and pt husband in room to provide updates from team conference, and d/c date 7/22. Pt has TTB so SW will not order. SW informed will order DME recommended by therapists. Preferred [facility] remains [facility] as she has used in the past.   Loralee Pacas, MSW, [place] Office: [phone] Cell: [phone] Fax: [phone]

## 2021-03-20 NOTE — Progress Notes (Signed)
Hypoglycemic Event  CBG: 35  Treatment: 8 oz juice/soda  Symptoms: None  Follow-up  MBW:[id number]    CBG result:90  Possible Reasons for Event: Unknown  Comments:  report given to AM nurse for f/u with MD.    Sheilah Mins

## 2021-03-21 LAB — GLUCOSE, CAPILLARY
Glucose-Capillary: 112 mg/dL — ABNORMAL HIGH (ref 70–99)
Glucose-Capillary: 120 mg/dL — ABNORMAL HIGH (ref 70–99)
Glucose-Capillary: 88 mg/dL (ref 70–99)
Glucose-Capillary: 99 mg/dL (ref 70–99)

## 2021-03-21 NOTE — Progress Notes (Signed)
Occupational Therapy Discharge Summary  Patient Details  Name: Cassandra [name] MRN: [id number] Date of Birth: 1933/03/31  Patient has met 10 of  10 long term goals due to improved activity tolerance, improved balance, postural control, ability to compensate for deficits, improved awareness, and improved coordination.  Patient to discharge at overall Supervision level.  Patient's care partner is independent to provide the necessary cognitive assistance at discharge, and they have hired caregivers to assist as well. [name], pt's spouse, has completed family education.   Reasons goals not met: n/a  Recommendation:  Patient will benefit from ongoing skilled OT services in home health setting to continue to advance functional skills in the area of BADL.  Equipment: No equipment provided  Reasons for discharge: treatment goals met and discharge from hospital  Patient/family agrees with progress made and goals achieved: Yes  OT Discharge Precautions/Restrictions  Precautions Precautions: Fall Precaution Comments: cam boot for transfers Required Braces or Orthoses: Other Brace Restrictions Weight Bearing Restrictions: Yes LLE Weight Bearing: Partial weight bearing Other Position/Activity Restrictions: NWB L LE for ambulation, ok for WB some with transfers ONLY; can take boot off for sleeping and for ankle rom   Pain Pain Assessment Pain Scale: 0-10 Pain Score: 0-No pain ADL ADL Eating: Independent Where Assessed-Eating: Edge of bed Grooming: Modified independent Where Assessed-Grooming: Sitting at sink Upper Body Bathing: Modified independent Where Assessed-Upper Body Bathing: Shower Lower Body Bathing: Supervision/safety Where Assessed-Lower Body Bathing: Shower Upper Body Dressing: Modified independent (Device) Where Assessed-Upper Body Dressing: Edge of bed Lower Body Dressing: Supervision/safety Where Assessed-Lower Body Dressing: Edge of bed Toileting:  Supervision/safety Where Assessed-Toileting: Bedside Commode Toilet Transfer: Distant supervision Toilet Transfer Method: Stand pivot Tub/Shower Transfer: Distant supervision Tub/Shower Transfer Method: Stand pivot Tub/Shower Equipment: Radio broadcast assistant Vision Baseline Vision/History: No visual deficits Patient Visual Report: No change from baseline Vision Assessment?: No apparent visual deficits Perception  Perception: Within Functional Limits Praxis Praxis: Intact Cognition Overall Cognitive Status: Within Functional Limits for tasks assessed Arousal/Alertness: Awake/alert Orientation Level: Oriented X4 Attention: Selective Selective Attention: Appears intact Awareness: Impaired Awareness Impairment: Anticipatory impairment Problem Solving: Appears intact Safety/Judgment: Impaired Comments: Likely baseline, pt resistant to education re fall risk reduction Sensation Sensation Light Touch: Impaired by gross assessment (hx of diabetic neuropathy) Proprioception: Appears Intact Coordination Gross Motor Movements are Fluid and Coordinated: No Fine Motor Movements are Fluid and Coordinated: Yes Coordination and Movement Description: Limited by weightbearing restrictions and inability to fully offload LLE in dynamic standing Motor  Motor Motor: Abnormal postural alignment and control Motor - Discharge Observations: Age related postural changes Mobility  Bed Mobility Bed Mobility: Rolling Right;Rolling Left;Supine to Sit;Sitting - Scoot to [employer] of Bed;Sit to Supine Rolling Right: Independent Rolling Left: Independent Supine to Sit: Independent Sitting - Scoot to Edge of Bed: Independent Sit to Supine: Independent Transfers Sit to Stand: Supervision/Verbal cueing Stand to Sit: Supervision/Verbal cueing  Trunk/Postural Assessment  Cervical Assessment Cervical Assessment: Exceptions to [facility] (forward head) Thoracic Assessment Thoracic Assessment: Exceptions to [facility]  (kyphotic) Lumbar Assessment Lumbar Assessment: Exceptions to [facility] (posterior pelvic tilt) Postural Control Postural Control: Deficits on evaluation Righting Reactions: delayed and inadequate  Balance Balance Balance Assessed: Yes Static Sitting Balance Static Sitting - Balance Support: Feet supported Static Sitting - Level of Assistance: 7: Independent Dynamic Sitting Balance Dynamic Sitting - Balance Support: During functional activity Dynamic Sitting - Level of Assistance: 6: Modified independent (Device/Increase time) Static Standing Balance Static Standing - Balance Support: Bilateral upper extremity supported;During functional activity Static Standing -  Level of Assistance: 5: Stand by assistance Dynamic Standing Balance Dynamic Standing - Balance Support: During functional activity;Bilateral upper extremity supported Dynamic Standing - Level of Assistance: 5: Stand by assistance Extremity/Trunk Assessment RUE Assessment RUE Assessment: Within Functional Limits LUE Assessment LUE Assessment: Within Functional Limits   Curtis Sites 03/21/2021, 1:00 PM

## 2021-03-21 NOTE — Progress Notes (Signed)
Occupational Therapy TBI Note  Patient Details  Name: JOHNANNA BAKKE MRN: [id number] Date of Birth: 06-07-1933  Today's Date: 03/21/2021 OT Individual Time: 1125-1150 OT Individual Time Calculation (min): 25 min    Short Term Goals: Week 1:  OT Short Term Goal 1 (Week 1): STG=LTG d/t ELOS  Skilled Therapeutic Interventions/Progress Updates:    Pt received supine with no c/o pain and agreeable to OT session. Pt's spouse [name] present for education. [name] was able to provide min A needed to don cam boot. She completed stand pivot transfers throughout session with supervision. Occasional cueing provided for safety and thoroughness of set up for transfers- pt often stating "just leave the footrest" and OT providing correction and edu re fall risk. Reviewed home fall risk reduction strategies. Pt completed tub transfer using TTB with supervision. 200 ft of w/c propulsion with several rest breaks and supervision overall. Pt was left sitting up with all needs met, chair alarm set.    Therapy Documentation Precautions:  Precautions Precautions: Fall Precaution Comments: cam boot for transfers Required Braces or Orthoses: Other Brace Restrictions Weight Bearing Restrictions: Yes LLE Weight Bearing: Partial weight bearing Other Position/Activity Restrictions: NWB L LE for ambulation, ok for WB some with transfers ONLY; can take boot off for sleeping and for ankle rom   Therapy/Group: Individual Therapy  Curtis Sites 03/21/2021, 6:32 AM

## 2021-03-21 NOTE — Progress Notes (Signed)
Inpatient Rehabilitation Care Coordinator Assessment and Plan Patient Details  Name: Cassandra [name] MRN: [id number] Date of Birth: October 01, 1932  Today's Date: 03/21/2021  Hospital Problems: Principal Problem:   Left trimalleolar fracture, sequela Active Problems:   Traumatic subarachnoid hemorrhage [facility])  Past Medical History:  Past Medical History:  Diagnosis Date   Acquired hypothyroidism 01/06/2014   Anxiety    Arthritis    B12 deficiency 02/15/2014   Benign essential hypertension 01/06/2014   Breast cancer ([place]) 1982   Right breast cancer - chemotherapy   CAD (coronary artery disease), native coronary artery 01/06/2014   Carotid artery calcification    Chronic airway obstruction, not elsewhere classified 01/06/2014   Depression    Diabetes mellitus without complication ([place])    History of kidney stones 2019   left ureteral stone   Incontinence of urine    Myocardial infarction [facility]) 1982   small infarct   Neuromuscular disorder ([facility])    restless legs   Pacemaker    Presence of permanent cardiac pacemaker 07/2016   Pure hypercholesterolemia 01/06/2014   Renal insufficiency    Skin cancer    Sleep apnea    uses cpap   Past Surgical History:  Past Surgical History:  Procedure Laterality Date   APPENDECTOMY     APPLICATION OF WOUND VAC Left 02/24/2021   Procedure: APPLICATION OF WOUND VAC;  Surgeon: Rod Can, MD;  Location: [place];  Service: Orthopedics;  Laterality: Left;   AUGMENTATION MAMMAPLASTY Bilateral 1982/redo in 2014   prior mastectomy   CARDIAC CATHETERIZATION N/A 04/23/2016   Procedure: Left Heart Cath and Coronary Angiography;  Surgeon: Corey Skains, MD;  Location: [place] [facility];  Service: Cardiovascular;  Laterality: N/A;   CARDIAC CATHETERIZATION Left 04/23/2016   Procedure: Coronary Stent Intervention;  Surgeon: Yolonda Kida, MD;  Location: [place] [facility];  Service: Cardiovascular;  Laterality: Left;   CAROTID ARTERY ANGIOPLASTY  Left 2010   had stent inserted and removed d/t infection. artery from leg inserted in left carotid   CAROTID ENDARTERECTOMY Left 2010   CHOLECYSTECTOMY     CORONARY ANGIOPLASTY WITH STENT PLACEMENT  september 9th 2017   CYSTOSCOPY W/ URETERAL STENT PLACEMENT Left 08/17/2017   Procedure: CYSTOSCOPY WITH RETROGRADE PYELOGRAM/URETERAL STENT PLACEMENT;  Surgeon: Festus Aloe, MD;  Location: [facility];  Service: Urology;  Laterality: Left;   CYSTOSCOPY W/ URETERAL STENT PLACEMENT Left 09/16/2017   Procedure: CYSTOSCOPY WITH STENT REPLACEMENT;  Surgeon: Abbie Sons, MD;  Location: [facility];  Service: Urology;  Laterality: Left;   CYSTOSCOPY/RETROGRADE/URETEROSCOPY/STONE EXTRACTION WITH BASKET Left 09/16/2017   Procedure: CYSTOSCOPY/RETROGRADE/URETEROSCOPY/STONE EXTRACTION WITH BASKET;  Surgeon: Abbie Sons, MD;  Location: [facility];  Service: Urology;  Laterality: Left;   EXTERNAL FIXATION LEG Left 02/24/2021   Procedure: EXTERNAL FIXATION  ANKLE;  Surgeon: Rod Can, MD;  Location: [place];  Service: Orthopedics;  Laterality: Left;   EXTERNAL FIXATION REMOVAL Left 02/26/2021   Procedure: REMOVAL EXTERNAL FIXATION LEG;  Surgeon: Shona Needles, MD;  Location: [place];  Service: Orthopedics;  Laterality: Left;   EYE SURGERY Bilateral    cataract extraction   I & D EXTREMITY Left 02/24/2021   Procedure: IRRIGATION AND DEBRIDEMENT EXTREMITY;  Surgeon: Rod Can, MD;  Location: [place];  Service: Orthopedics;  Laterality: Left;   KYPHOPLASTY N/A 02/03/2018   Procedure: [id number];  Surgeon: Hessie Knows, MD;  Location: [facility];  Service: Orthopedics;  Laterality: N/A;   MASTECTOMY Right 1982   ORIF ANKLE FRACTURE  Left 02/26/2021   Procedure: OPEN REDUCTION INTERNAL FIXATION (ORIF) ANKLE FRACTURE;  Surgeon: Shona Needles, MD;  Location: [place];  Service: Orthopedics;  Laterality: Left;   PACEMAKER INSERTION Left 07/03/2016   Procedure: INSERTION PACEMAKER;  Surgeon: Isaias Cowman, MD;  Location: [facility];  Service: Cardiovascular;  Laterality: Left;   SKIN CANCER EXCISION     TUBAL LIGATION     VISCERAL ANGIOGRAPHY N/A 11/03/2019   Procedure: VISCERAL ANGIOGRAPHY;  Surgeon: Katha Cabal, MD;  Location: [place] [facility];  Service: Cardiovascular;  Laterality: N/A;   Social History:  reports that she quit smoking about 66 years ago. Her smoking use included cigarettes. She started smoking about 67 years ago. She smoked an average of .5 packs per day. She has never used smokeless tobacco. She reports previous alcohol use. She reports previous drug use.  Family / Support Systems Marital Status: Married How Long?: 32 years Patient Roles: Spouse, Parent Spouse/Significant Other: Cassandra [name] (husband) :[phone] Children: has children from a previous marriage (2 boys, 1 girl-Cassandra [name]). 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 Education: high school Read: Yes Write: Yes Employment Status: Retired Date Retired/Disabled/Unemployed: Museum/gallery curator Issues: Denies Guardian/Conservator: N/A   Abuse/Neglect Abuse/Neglect Assessment Can Be Completed: Yes Physical Abuse: Denies Verbal Abuse: Denies Sexual Abuse: Denies Exploitation of patient/patient's resources: Denies Self-Neglect: Denies  Emotional Status Pt's affect, behavior and adjustment status: Pt appeared to be annoyed at time of visit, but was able to complete assessment Recent Psychosocial Issues: Denies Psychiatric History: Denies Substance Abuse History: Pt admits to a glass of wine or Margarita every 2 weeks. Denies rec use.  Patient / Family Perceptions, Expectations &  Goals Pt/Family understanding of illness & functional limitations: Pt and husband have a general understanding of care needs Premorbid pt/family roles/activities: Independent with ADLs Anticipated changes in roles/activities/participation: Assistance with ADLs/IADLs Pt/family expectations/goals: Pt goal- "walking, and ot do the best tha tI can."  [employer]: None Premorbid Home Care/DME Agencies: None Transportation available at discharge: Husband  Discharge Planning Living Arrangements: Spouse/significant other Support Systems: Spouse/significant other, Children, Home care staff Type of Residence: Private residence Insurance Resources: Multimedia programmer (specify) ([employer] Medicare) Museum/gallery curator Resources: Fish farm manager, Family Support Financial Screen Referred: No Living Expenses: Mortgage Money Management: Spouse Does the patient have any problems obtaining your medications?: No Home Management: Pt managed home care needs Patient/Family Preliminary Plans: Pt will have assistance from private aide services. Care Coordinator Anticipated Follow Up Needs: HH/OP  Clinical Impression SW met with pt and pt husband in room to introduce self, explain role, and discuss discharge process. Pt is not a English as a second language teacher. HCPOA- 1) Cassandra [name] (husband), and 2) Cassandra [name] (dtr). DME: w/c, 2 RWs, canes,  CPAP machine, and ?TTB.. Will confirm if she has TTB or shower chair. Pt will have the following assistance: M/T/W until 1pm/F/Sa/Sun-husband; Wednesday 1:30pm-7:30pm- aide; Thursday 7am-9am dtr Cassandra [name] and 9am-2pm aide. HHA preference is [facility] as she has used in the past.   Rana Snare 03/21/2021, 4:39 PM

## 2021-03-21 NOTE — Progress Notes (Signed)
Occupational Therapy TBI Note  Patient Details  Name: Cassandra [name] MRN: [id number] Date of Birth: 1932/11/05  Today's Date: 03/21/2021 OT Individual Time: 0700-0757 OT Individual Time Calculation (min): 57 min    Short Term Goals: Week 1:  OT Short Term Goal 1 (Week 1): STG=LTG d/t ELOS  Skilled Therapeutic Interventions/Progress Updates:     Pt received in bed with 0 out of 10 pain. Pt dressed and ready to go for tx. OT educates that tomorrow pt will need to take shower to demo improvement in functional performance of ADLs. Pt eventually reporting back pain and provided heat that "made it feel a whole lot better"  ADL: Stand pivot transfer with RW and VC for backing up fully to w/c for safety and supervision for balance Grooming at sink seated with A to open toothapste only. Pt educated to open difficult containers prior to taking out dentures to allow full grip strength to open.   Therapeutic exercise Pt completes 3x30 ball toss (chest, bounce, overhead pass) in supported seated position with 1.5 # wrist weights to improve BUE coordination/strengthening required for BADLs/functional transfers.   Pt left at end of session in bed with exit alarm on, call light in reach and all needs met   Therapy Documentation Precautions:  Precautions Precautions: Fall Precaution Comments: cam boot for transfers Required Braces or Orthoses: Other Brace Restrictions Weight Bearing Restrictions: Yes LLE Weight Bearing: Partial weight bearing Other Position/Activity Restrictions: NWB L LE for ambulation, ok for WB some with transfers ONLY; can take boot off for sleeping and for ankle rom General:   Vital Signs: Therapy Vitals Temp: (!) 97.5 F (36.4 C) Pulse Rate: (!) 59 Resp: 20 BP: (!) 142/43 Patient Position (if appropriate): Lying Oxygen Therapy SpO2: 97 % O2 Device: Nasal Cannula Pain:   Agitated Behavior Scale: TBI  Observation Details Observation Environment:  C[facility] Start of observation period - Date: 03/21/21 Start of observation period - Time: 0700 End of observation period - Date: 03/21/21 End of observation period - Time: 0757 Agitated Behavior Scale (DO NOT LEAVE BLANKS) Short attention span, easy distractibility, inability to concentrate: Absent Impulsive, impatient, low tolerance for pain or frustration: Absent Uncooperative, resistant to care, demanding: Absent Violent and/or threatening violence toward people or property: Absent Explosive and/or unpredictable anger: Absent Rocking, rubbing, moaning, or other self-stimulating behavior: Absent Pulling at tubes, restraints, etc.: Absent Wandering from treatment areas: Absent Restlessness, pacing, excessive movement: Absent Repetitive behaviors, motor, and/or verbal: Absent Rapid, loud, or excessive talking: Absent Sudden changes of mood: Absent Easily initiated or excessive crying and/or laughter: Absent Self-abusiveness, physical and/or verbal: Absent Agitated behavior scale total score: 14   ADL: ADL Eating: Independent Grooming: Setup Upper Body Bathing: Setup Lower Body Bathing: Minimal assistance Where Assessed-Lower Body Bathing: Edge of bed Upper Body Dressing: Supervision/safety Where Assessed-Upper Body Dressing: Edge of bed Lower Body Dressing: Minimal assistance Where Assessed-Lower Body Dressing: Edge of bed Toileting: Minimal assistance Where Assessed-Toileting: Bedside Commode Toilet Transfer: Minimal assistance Toilet Transfer Method: Stand pivot Tub/Shower Transfer: Minimal assistance Tub/Shower Transfer Method: Stand pivot Tub/Shower Equipment: Nurse, learning disability    Praxis   Exercises:   Other Treatments:     Therapy/Group: Individual Therapy  Tonny Branch 03/21/2021, 8:00 AM

## 2021-03-21 NOTE — Progress Notes (Signed)
Patient ID: Cassandra [name], female   DOB: 05-19-1933, 87 y.o.   MRN: [id number]  SW ordered w/c with [employer] via parachute.  SW sent HHPT/OT referral to [facility], and waiting on follow-up.   Loralee Pacas, MSW, [place] Office: [phone] Cell: [phone] Fax: [phone]

## 2021-03-21 NOTE — Progress Notes (Signed)
Physical Therapy TBI Note  Patient Details  Name: Cassandra [name] MRN: [id number] Date of Birth: 16-Jul-1933  Today's Date: 03/21/2021 PT Individual Time: 3710-6269 and 1415-1555 PT Individual Time Calculation (min): 60 min and 40 min  Short Term Goals: Week 1:  PT Short Term Goal 1 (Week 1): STG=LTG due to ELOS.  Skilled Therapeutic Interventions/Progress Updates:     Patient in bed with her husband in the room upon PT arrival. Patient alert and agreeable to PT session. Patient denied pain during session.  Patient's husband participated in family training throughout session. Discussed equipment needs, d/c planning, HHPT role and expectations, and home set-up. Patient's husband reports that the ramped entry is in place and completed, doorways are 24", plan for 16"x16" w/c for doorway access and drop down leg rests for improved household mobility. Educated patient on only using the w/c for transport in the home and never for prolonged sitting due to lack of space at her hips and drop down leg rests. Patient stated she plans to sit up in the bed or on the couch with her legs elevated on an ottoman for edema control. Also, recommending a youth height RW for patient due to short stature and improved upper extremity leverage for transfers and gait due to weight bearing precautions.    Therapeutic Activity: Bed Mobility: Patient performed supine to/from sit with independently.  Transfers: Patient performed stand pivot with supervision-CGA with RW from bed<>w/c and w/c<>ADL couch. Provided verbal cues for R weight shift to off weight L foot with CAM boot. Patient performed a simulated sedan height car transfer with CGA-close supervision performing a squat pivot with max cuing for technique to reduce weight bearing on her L foot wit the CAM boot. Discussed that transfers were best performed with the RW to off-weight her foot.   Wheelchair Mobility:  Patient propelled wheelchair >200 feet x2 with  supervision. Provided verbal cues for turning technique and safety in narrow doorways. Demonstrated donning/doffing leg rests and use of breaks and patient and her husband performed this throughout session.  Patient in bed with her husband at bedside at end of session with breaks locked, bed alarm set, and all needs within reach.   Session 2: Patient in the bathroom transferring back to the w/c with the NT upon PT arrival. Patient alert and agreeable to PT session. Patient denied pain during session.  Therapeutic Activity: Bed Mobility: Patient performed supine to/from sit with supervision on mat table with brown wedge for back comfort and sit to supine in a flat bed without use of bed rails with mod I. Patient with CAM boot donned throughout session, and patient able to doff CAM boot independently sitting EOB at end of session.  Transfers: Patient performed stand pivot with supervision with RW from w/c<>mat table and w/c>bed, as above  Wheelchair Mobility:  Patient propelled wheelchair ~50 feet with supervision-mod I. Patient managed leg rests and breaks with min A due to heaviness of parts and patient with arthritis in her hands per patient report.  Therapeutic Exercise: Patient performed 2 sets of the following exercises for her L leg with verbal and tactile cues for proper technique. Provided HEP handout at end of session. Access Code: [email or web address] Sidelying Hip Abduction - 2 x daily - 7 x weekly - 2 sets - 10 reps Straight Leg Raise - 2 x daily - 7 x weekly - 2 sets - 10 reps Single Leg Bridge - 2 x daily - 7 x weekly - 2 sets -  10 reps Seated Toe Raise - 1 x daily - 7 x weekly - 3 sets - 10 reps Ankle Inversion Eversion Towel Slide - 2 x daily - 7 x weekly - 2 sets - 10 reps Supine Ankle Circles - 2 x daily - 7 x weekly - 2 sets - 10 reps  Patient in bed at end of session with breaks locked, bed alarm set, and all needs within reach.    Therapy Documentation Precautions:   Precautions Precautions: Fall Precaution Comments: cam boot for transfers Required Braces or Orthoses: Other Brace Restrictions Weight Bearing Restrictions: Yes LLE Weight Bearing: Partial weight bearing Other Position/Activity Restrictions: NWB L LE for ambulation, ok for WB some with transfers ONLY; can take boot off for sleeping and for ankle rom Agitated Behavior Scale: TBI Observation Details Observation Environment: [place] Start of observation period - Date: 03/21/21 Start of observation period - Time: 0915 End of observation period - Date: 03/21/21 End of observation period - Time: 1015 Agitated Behavior Scale (DO NOT LEAVE BLANKS) Short attention span, easy distractibility, inability to concentrate: Absent Impulsive, impatient, low tolerance for pain or frustration: Absent Uncooperative, resistant to care, demanding: Absent Violent and/or threatening violence toward people or property: Absent Explosive and/or unpredictable anger: Absent Rocking, rubbing, moaning, or other self-stimulating behavior: Absent Pulling at tubes, restraints, etc.: Absent Wandering from treatment areas: Absent Restlessness, pacing, excessive movement: Absent Repetitive behaviors, motor, and/or verbal: Absent Rapid, loud, or excessive talking: Absent Sudden changes of mood: Absent Easily initiated or excessive crying and/or laughter: Absent Self-abusiveness, physical and/or verbal: Absent Agitated behavior scale total score: 14 Agitated Behavior Scale: TBI Observation Details Observation Environment: [place] Start of observation period - Date: 03/21/21 Start of observation period - Time: 1415 End of observation period - Date: 03/21/21 End of observation period - Time: 1455 Agitated Behavior Scale (DO NOT LEAVE BLANKS) Short attention span, easy distractibility, inability to concentrate: Absent Impulsive, impatient, low tolerance for pain or frustration: Absent Uncooperative, resistant to  care, demanding: Absent Violent and/or threatening violence toward people or property: Absent Explosive and/or unpredictable anger: Absent Rocking, rubbing, moaning, or other self-stimulating behavior: Absent Pulling at tubes, restraints, etc.: Absent Wandering from treatment areas: Absent Restlessness, pacing, excessive movement: Absent Repetitive behaviors, motor, and/or verbal: Absent Rapid, loud, or excessive talking: Absent Sudden changes of mood: Absent Easily initiated or excessive crying and/or laughter: Absent Self-abusiveness, physical and/or verbal: Absent Agitated behavior scale total score: 14     Therapy/Group: Individual Therapy  Bronislaw Switzer L Elyanah Farino PT, DPT  03/21/2021, 12:32 PM
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
## 2021-03-22 LAB — GLUCOSE, CAPILLARY
Glucose-Capillary: 110 mg/dL — ABNORMAL HIGH (ref 70–99)
Glucose-Capillary: 91 mg/dL (ref 70–99)
Glucose-Capillary: 95 mg/dL (ref 70–99)

## 2021-03-22 MED ORDER — SOLIFENACIN SUCCINATE 10 MG PO TABS: 10.0000 mg | ORAL_TABLET | Freq: Every day | ORAL | 11 refills | Status: DC

## 2021-03-22 MED ORDER — QUETIAPINE FUMARATE 25 MG PO TABS: 25.0000 mg | ORAL_TABLET | Freq: Every day | ORAL | 0 refills | Status: DC

## 2021-03-22 MED ORDER — LEVOTHYROXINE SODIUM 125 MCG PO TABS: 125.0000 ug | ORAL_TABLET | Freq: Every day | ORAL | 0 refills | Status: DC

## 2021-03-22 MED ORDER — ROSUVASTATIN CALCIUM 10 MG PO TABS: 10.0000 mg | ORAL_TABLET | Freq: Every day | ORAL | 0 refills | Status: DC

## 2021-03-22 MED ORDER — METOPROLOL SUCCINATE ER 25 MG PO TB24: 12.5000 mg | ORAL_TABLET | Freq: Every day | ORAL | 0 refills | Status: DC

## 2021-03-22 MED ORDER — HYDROCODONE-ACETAMINOPHEN 7.5-325 MG PO TABS: 1.0000 | ORAL_TABLET | ORAL | 0 refills | Status: DC | PRN

## 2021-03-22 MED ORDER — AMITRIPTYLINE HCL 10 MG PO TABS
10.0000 mg | ORAL_TABLET | Freq: Every day | ORAL | 0 refills | Status: DC
Start: 2021-03-22 — End: 2021-12-21

## 2021-03-22 MED ORDER — POLYETHYLENE GLYCOL 3350 17 G PO PACK: 17.0000 g | PACK | Freq: Two times a day (BID) | ORAL | 0 refills | Status: DC

## 2021-03-22 MED ORDER — CLOPIDOGREL BISULFATE 75 MG PO TABS: 75.0000 mg | ORAL_TABLET | Freq: Every day | ORAL | 0 refills | Status: DC

## 2021-03-22 MED ORDER — PANTOPRAZOLE SODIUM 40 MG PO TBEC: 40.0000 mg | DELAYED_RELEASE_TABLET | Freq: Every day | ORAL | 0 refills | Status: DC

## 2021-03-22 MED ORDER — PAROXETINE HCL 10 MG PO TABS
10.0000 mg | ORAL_TABLET | Freq: Every day | ORAL | 0 refills | Status: DC
Start: 2021-03-22 — End: 2021-12-21

## 2021-03-22 MED ORDER — CYANOCOBALAMIN 1000 MCG PO TABS: 1000.0000 ug | ORAL_TABLET | Freq: Every day | ORAL | 2 refills | Status: DC

## 2021-03-22 MED ORDER — ALBUTEROL SULFATE HFA 108 (90 BASE) MCG/ACT IN AERS: 2.0000 | INHALATION_SPRAY | Freq: Four times a day (QID) | RESPIRATORY_TRACT | 0 refills | Status: DC | PRN

## 2021-03-22 MED ORDER — LANTUS SOLOSTAR 100 UNIT/ML ~~LOC~~ SOPN: 12.0000 [IU] | PEN_INJECTOR | Freq: Every day | SUBCUTANEOUS | 11 refills | Status: DC

## 2021-03-22 MED ORDER — VITAMIN D3 25 MCG (1000 UNIT) PO TABS: 1000.0000 [IU] | ORAL_TABLET | Freq: Every day | ORAL | 0 refills | Status: DC

## 2021-03-22 MED ORDER — SUCRALFATE 1 G PO TABS
2.0000 g | ORAL_TABLET | Freq: Four times a day (QID) | ORAL | 0 refills | Status: DC
Start: 2021-03-22 — End: 2021-10-16

## 2021-03-22 MED ORDER — MAGNESIUM OXIDE 400 MG PO TABS: 400.0000 mg | ORAL_TABLET | Freq: Two times a day (BID) | ORAL | 0 refills | Status: DC

## 2021-03-22 NOTE — Progress Notes (Signed)
Physical Therapy Discharge Summary  Patient Details  Name: Cassandra [name] MRN: [id number] Date of Birth: 1932-12-02  Today's Date: 03/22/2021 PT Individual Time: 5732-2025; 4270-6237 PT Individual Time Calculation (min): 59 min; 45 min   Patient has met 7 of 8 long term goals due to improved activity tolerance, improved balance, improved postural control, increased strength, increased range of motion, decreased pain, ability to compensate for deficits, and improved coordination. 8th goal is adequate for discharge, see below. Patient to discharge at a wheelchair level Supervision.   Patient's care partner is independent to provide the necessary physical assistance at discharge.  Reasons goals not met: Pt continues to be supervision (goal:ModI) with verbal cuing for wheelchair mobility goal in controlled environment for safety. Goal is adequate for discharge due to pt having 24/7 assistance upon discharge home.   Recommendation:  Patient will benefit from ongoing skilled PT services in home health setting to continue to advance safe functional mobility, address ongoing impairments in strength, ROM, balance, coordination, functional mobility, gait training, and adherence to weight bearing precautions, and minimize fall risk.  Equipment: 16"x16" light weight wheelchair and youth rolling walker   Reasons for discharge: treatment goals met and discharge from hospital  Patient/family agrees with progress made and goals achieved: Yes  Skilled Intervention Session One: Pt received sitting in wc and agreeable to therapy. Discharge assessment completed below.   Pt completed wheelchair mobility throughout session with largest distance 262f + smaller bouts at supervision level with verbal cuing while navigating doorways and other obstacles in the environment safely. Pt demonstrated ability to navigate unlevel surfaces outside at supervision level with verbal cuing for braking/steering technique.  Pt completed sloped surface for ramp management training with verbal/visual cues to enhance energy efficiency and manual assistance ascending/descending slope for safety.  Pt completed car transfer at estimated height of the car for tomorrow's discharge with supervision and verbal cuing to navigate an increased seat height safely.   Pt sitting in wc with brakes locked, chair alarm on, and call bell within reach.    Session Two:  Pt received sitting EOB and agreeable to therapy. Second session focused on HEP exercises and pt education to ensure safe body mechanics within precautions and independence with continued care upon return home. Pt transition sitting EOB > supine independently.   Access Code: [email or web address] Hip Abduction - 2 x daily - 7 x weekly - 2 sets - 10 reps Straight Leg Raise - 2 x daily - 7 x weekly - 2 sets - 10 reps Single Leg Bridge - 2 x daily - 7 x weekly - 2 sets - 10 reps Seated Toe Raise - 1 x daily - 7 x weekly - 3 sets - 10 reps Ankle Inversion Eversion Towel Slide - 2 x daily - 7 x weekly - 2 sets - 10 reps Supine Ankle Circles - 2 x daily - 7 x weekly - 2 sets - 10 reps  Therapist noted erythema and increased in temperature compared to contralateral LE. Notified RN and PA. Pt education on importance of monitoring skin/wound integrity and signs of infection. Discussed equipment needs and discrepancies with delivery. Followed up with CSW.    Pt supine in bed with HOB elevated, bed alarm on, and call bed within reach.    PT Discharge Precautions/Restrictions Precautions Precautions: Fall Precaution Comments: cam boot for transfers Required Braces or Orthoses: Other Brace Restrictions Weight Bearing Restrictions: Yes LLE Weight Bearing: Non weight bearing Other Position/Activity Restrictions: NWB L LE for  ambulation, ok for WB some with transfers ONLY; can take boot off for sleeping and for ankle rom  Pain Pain Assessment Pain Scale: 0-10 Pain Score: 0-No  pain Vision/Perception  Vision - Assessment Eye Alignment: Within Functional Limits Ocular Range of Motion: Within Functional Limits Tracking/Visual Pursuits: Decreased smoothness of horizontal tracking Saccades: Decreased speed of saccadic movement Convergence: Within functional limits Perception Perception: Within Functional Limits Praxis Praxis: Intact  Cognition Overall Cognitive Status: Within Functional Limits for tasks assessed Arousal/Alertness: Awake/alert Orientation Level: Oriented X4 [employer] Scales of Cognitive Functioning: Purposeful/appropriate Sensation Sensation Light Touch: Impaired by gross assessment (Deminished sensitivity to bilateral feet) Proprioception: Appears Intact Coordination Gross Motor Movements are Fluid and Coordinated: No Coordination and Movement Description: Limited by weightbearing restrictions and inability to fully offload LLE in dynamic standing Motor  Motor Motor: Abnormal postural alignment and control Motor - Skilled Clinical Observations: Age related postural changes  Mobility Transfers Transfers: Sit to Stand;Stand to Sit;Stand Pivot Transfers Sit to Stand: Supervision/Verbal cueing Stand to Sit: Supervision/Verbal cueing Stand Pivot Transfers: Supervision/Verbal cueing Stand Pivot Transfer Details: Verbal cues for safe use of DME/AE;Verbal cues for precautions/safety Stand Pivot Transfer Details (indicate cue type and reason): Pt able to verbalize safety precautions of NWB with expection of transfers with camboot for LLE  Transfer (Assistive device): Rolling walker Locomotion  Gait Ambulation: No Gait Gait: No Stairs / Additional Locomotion Stairs: No Wheelchair Mobility Wheelchair Mobility: Yes Wheelchair Assistance: Chartered loss adjuster: Both upper extremities Wheelchair Parts Management: Supervision/cueing Distance: 250  Trunk/Postural Assessment  Cervical Assessment Cervical  Assessment: Exceptions to [facility] (forward head) Thoracic Assessment Thoracic Assessment: Exceptions to [facility] (rounded shoulders) Lumbar Assessment Lumbar Assessment: Exceptions to [facility] (posterior pelvic tilt)  Balance Balance Balance Assessed: Yes Static Sitting Balance Static Sitting - Balance Support: Feet supported Static Sitting - Level of Assistance: 7: Independent Dynamic Sitting Balance Dynamic Sitting - Balance Support: During functional activity (with donning shoe/cam boot) Dynamic Sitting - Level of Assistance: 6: Modified independent (Device/Increase time) Static Standing Balance Static Standing - Balance Support: Bilateral upper extremity supported;During functional activity Static Standing - Level of Assistance: 5: Stand by assistance Dynamic Standing Balance Dynamic Standing - Balance Support: During functional activity;Bilateral upper extremity supported Dynamic Standing - Level of Assistance: 5: Stand by assistance Extremity Assessment  RLE Assessment RLE Assessment: Within Functional Limits RLE Strength Right Hip Flexion: 4/5 Right Hip ABduction: 4+/5 Right Hip ADduction: 4+/5 Right Knee Flexion: 4+/5 Right Ankle Dorsiflexion: 4/5 LLE Assessment LLE Assessment: Exceptions to [facility] Active Range of Motion (AROM) Comments: ankle ROM severely limited to ~35 degrees PF and 0 degrees DF General Strength Comments: Unable to fully MMT with LLE due to precautions LLE Strength Left Hip Flexion: 4-/5 Left Hip ABduction: 4/5 Left Hip ADduction: 4/5    Latima Hamza, SPT  03/22/2021, 12:25 PM
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
## 2021-03-22 NOTE — Progress Notes (Addendum)
Occupational Therapy TBI Note  Patient Details  Name: Cassandra [name] MRN: [id number] Date of Birth: 06/27/1933  Today's Date: 03/22/2021 OT Individual Time: 1941-7408 OT Individual Time Calculation (min): 58 min   Today's Date: 03/22/2021 OT Individual Time: 1448-1856 OT Individual Time Calculation (min): 60 min    Short Term Goals: Week 1:  OT Short Term Goal 1 (Week 1): STG=LTG d/t ELOS  Skilled Therapeutic Interventions/Progress Updates:    Session 1:  Pt received in bed with soreness in back but not pain. Offered pt heat pack but reporting heat from shower helped. ADL:  Pt completes bathing with supervision with VC for seated LB washing/ lateral leans d/t not wearing boot in shower. Pt completes UB dressing with set up Pt completes LB dressing with supervision sit to stand at sink to advance pants past hips. Pt completes footwear with increased time to don CAM boot and suppervision Pt completes toileting with superviison with RW at sit to stand level for bladder void Pt completes toileting transfer with with RW via stand pivot transfer Pt completes shower/Tub transfer with supervision for stand pivot into shower stall with grab bar. Pt doffs CAM shoe for shower and dons prior to exiting shower.    Pt left at end of session in bed with exit alarm on, call light in reach and all needs met  Session 2:  Pt received in w/c with no pain.   ADL:  Pt completes toileting with supervision and 1 LOB nteriorly using RW to catch self. Recommend unilateral support on RW for advancing pants past hips. Pt completes toileting transfer with supervision Pt completes shower/Tub transfer with RW and supervision for use of TTB to get into tub. Pt educated that she is not to walk in to bathroom and should complete sink baths if unable to hop into bathroom/stand pivot from w/c. Pt verbalized understanding. Pt able to maneuver w/c in ADL apartment kitchen for simulated simple meal prep  transporting items with S.  Therapeutic exercise  HEP provided. Pt practices all therex 2x10 reps. All standing exercises recommended sitting and notes made to adjust HEP and with written cues for pt technique.   Access Code: [email or web address] URL: [email or web address] Date: 03/22/2021 Prepared by: Mariane Masters  Exercises Standing Shoulder Horizontal Abduction with Resistance - 1 x daily - 7 x weekly - 3 sets - 10 reps Standing Shoulder Diagonal Horizontal Abduction 60/120 Degrees with Resistance - 1 x daily - 7 x weekly - 3 sets - 10 reps Standing Shoulder Flexion with Resistance - 1 x daily - 7 x weekly - 3 sets - 10 reps Seated Elbow Extension with Self-Anchored Resistance - 1 x daily - 7 x weekly - 3 sets - 10 reps Seated Elbow Flexion with Self-Anchored Resistance - 1 x daily - 7 x weekly - 3 sets - 10 reps Scaption with Resistance - 1 x daily - 7 x weekly - 3 sets - 10 reps  Pt left at end of session in edge of bed with exit alarm on, call light in reach and all needs met   Therapy Documentation Precautions:  Precautions Precautions: Fall Precaution Comments: cam boot for transfers Required Braces or Orthoses: Other Brace Restrictions Weight Bearing Restrictions: Yes LLE Weight Bearing: Partial weight bearing Other Position/Activity Restrictions: NWB L LE for ambulation, ok for WB some with transfers ONLY; can take boot off for sleeping and for ankle rom General:   Vital Signs: Therapy Vitals Temp: 98.1 F (36.7 C) Temp Source: Oral Pulse  Rate: 66 Resp: 18 BP: (!) 156/53 Patient Position (if appropriate): Lying Oxygen Therapy SpO2: 97 % O2 Device: Room Air Pain:   Agitated Behavior Scale: TBI  Observation Details Observation Environment: [place] of observation period - Date: 03/22/21 Start of observation period - Time: 1100 End of observation period - Date: 03/22/21 End of observation period - Time: 1200 Agitated Behavior Scale (DO NOT  LEAVE BLANKS) Short attention span, easy distractibility, inability to concentrate: Absent Impulsive, impatient, low tolerance for pain or frustration: Absent Uncooperative, resistant to care, demanding: Absent Violent and/or threatening violence toward people or property: Absent Explosive and/or unpredictable anger: Absent Rocking, rubbing, moaning, or other self-stimulating behavior: Absent Pulling at tubes, restraints, etc.: Absent Wandering from treatment areas: Absent Restlessness, pacing, excessive movement: Absent Repetitive behaviors, motor, and/or verbal: Absent Rapid, loud, or excessive talking: Absent Sudden changes of mood: Absent Easily initiated or excessive crying and/or laughter: Absent Self-abusiveness, physical and/or verbal: Absent Agitated behavior scale total score: 14  ADL: ADL Eating: Independent Where Assessed-Eating: Edge of bed Grooming: Modified independent Where Assessed-Grooming: Sitting at sink Upper Body Bathing: Modified independent Where Assessed-Upper Body Bathing: Shower Lower Body Bathing: Supervision/safety Where Assessed-Lower Body Bathing: Shower Upper Body Dressing: Modified independent (Device) Where Assessed-Upper Body Dressing: Edge of bed Lower Body Dressing: Supervision/safety Where Assessed-Lower Body Dressing: Edge of bed Toileting: Supervision/safety Where Assessed-Toileting: Bedside Commode Toilet Transfer: Distant supervision Toilet Transfer Method: Stand pivot Tub/Shower Transfer: Distant supervision Tub/Shower Transfer Method: Stand pivot Tub/Shower Equipment: Nurse, learning disability    Praxis   Exercises:   Other Treatments:     Therapy/Group: Individual Therapy  Tonny Branch 03/22/2021, 6:47 AM

## 2021-03-22 NOTE — Progress Notes (Signed)
Patient ID: Cassandra [name], female   DOB: 03-09-1933, 87 y.o.   MRN: [id number]  SW waiting on f/u from [facility] to see if they can accept referral. *Referral declined.   SW sent HHPT/OT order to Angie[facility] and waiting on f/u. *Reports in [place] they do not have OT only PT, but may not be able to accept referral due to limited staffing. SW informed will confirm with therapy if OT is not needed.   *SW left message for pt husband Cassandra [name] on above about w/c. SW called pt in room to provide above updates. Pt requested a RW stating that the one she has at home cannot be adjusted. SW reminded her it will cost her as insurance will only cover w/c or RW. SW discussed d/c times with pt.   RW ordered with [facility] via parachute.   Loralee Pacas, MSW, [place] Office: [phone] Cell: [phone] Fax: [phone]

## 2021-03-23 LAB — GLUCOSE, CAPILLARY: Glucose-Capillary: 116 mg/dL — ABNORMAL HIGH (ref 70–99)

## 2021-03-23 LAB — CREATININE, SERUM
Creatinine, Ser: 0.98 mg/dL (ref 0.44–1.00)
GFR, Estimated: 56 mL/min — ABNORMAL LOW (ref >60)

## 2021-03-23 MED ORDER — MELATONIN 3 MG PO TABS: 6.0000 mg | ORAL_TABLET | Freq: Every evening | ORAL | 0 refills | Status: DC | PRN

## 2021-03-23 NOTE — Progress Notes (Signed)
Patient discharged to home. Accompanied by husband. All belongings checked and packed with patient.

## 2021-03-23 NOTE — Progress Notes (Signed)
Patient ID: Cassandra [name], female   DOB: 03/27/1933, 88 y.o.   MRN: [id number]  SW met with pt in room to inform on RW being delivered by [facility] this morning, and challenges with HHA. SW informed will continue to work on HHA even after discharge.   SW spoke with [facility] ([phone]) to discuss referral. SW waiting on updates as she has to speak with the branch.  *declined due to staffing.  SW waiting on updates from Cheryl/[facility], Amy/[facility], and Kenzie/[facility].   Declined HHAs [facility]- declined d/t staffing [facility]- declined as no staffing in area   , MSW, LCSWA Office: [phone] Cell: [phone] Fax: [phone]  
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
## 2021-03-26 NOTE — Progress Notes (Signed)
Inpatient Rehabilitation Care Coordinator Discharge Note  The overall goal for the admission was met for:   Discharge location: Yes. D/c to home with support from husband, dtr, and private aide.   Length of Stay: Yes. 6 days.   Discharge activity level: Yes. Supervision at w/c level.   Home/community participation: Yes  Services provided included: MD, RD, PT, OT, SLP, RN, CM, TR, Pharmacy, Neuropsych, and SW  Financial Services: Private Insurance: [employer] Medicare  Choices offered to/list presented to:Yes  Follow-up services arranged: DME: [facility] for w/c, Other: Pt did not d/c with HH services as unable to find an agency willing to accept. SW will continue to search for options, and Patient/Family request agency HH: [facility], DME: N/A  Comments (or additional information):  Patient/Family verbalized understanding of follow-up arrangements: Yes  Individual responsible for coordination of the follow-up plan: contact pt husband [name] #[phone]  Confirmed correct DME delivered:  A  03/26/2021     A  

## 2021-03-29 ENCOUNTER — Telehealth: Payer: Self-pay

## 2021-03-29 NOTE — Telephone Encounter (Signed)
HHPT/OT referral accepted by Cheryl[facility] [phone]). Reports SOC will not be until mid next week.   SW called pt husband Cassandra [name] to inform on above. SW provided contact for liaison, and [employer] [phone]. Husband asked about status of w/c. SW advised him to f/u with [facility] to check the status.

## 2021-04-09 ENCOUNTER — Other Ambulatory Visit: Payer: Self-pay | Admitting: Internal Medicine

## 2021-04-09 DIAGNOSIS — N6310 Unspecified lump in the right breast, unspecified quadrant: Secondary | ICD-10-CM

## 2021-04-16 ENCOUNTER — Encounter
Payer: Medicare Other | Attending: Physical Medicine and Rehabilitation | Admitting: Physical Medicine and Rehabilitation

## 2021-04-16 ENCOUNTER — Encounter: Payer: Self-pay | Admitting: Physical Medicine and Rehabilitation

## 2021-04-16 ENCOUNTER — Other Ambulatory Visit: Payer: Self-pay

## 2021-04-16 VITALS — BP 153/88 | HR 68 | Temp 98.2°F | Ht 59.0 in | Wt 152.0 lb

## 2021-04-16 DIAGNOSIS — S82852S Displaced trimalleolar fracture of left lower leg, sequela: Secondary | ICD-10-CM | POA: Diagnosis present

## 2021-04-16 DIAGNOSIS — S066X1S Traumatic subarachnoid hemorrhage with loss of consciousness of 30 minutes or less, sequela: Secondary | ICD-10-CM | POA: Insufficient documentation

## 2021-04-16 DIAGNOSIS — E119 Type 2 diabetes mellitus without complications: Secondary | ICD-10-CM
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
## 2021-04-16 NOTE — Patient Instructions (Addendum)
HTN: -Advised regarding healthy foods that can help lower blood pressure and provided with a list: 1) citrus foods- high in vitamins and minerals 2) salmon and other fatty fish - reduces inflammation and oxylipins 3) swiss chard (leafy green)- high level of nitrates 4) pumpkin seeds- one of the best natural sources of magnesium 5) Beans and lentils- high in fiber, magnesium, and potassium 6) Berries- high in flavonoids 7) Amaranth (whole grain, can be cooked similarly to rice and oats)- high in magnesium and fiber 8) Pistachios- even more effective at reducing BP than other nuts 9) Carrots- high in phenolic compounds that relax blood vessels and reduce inflammation 10) Celery- contain phthalides that relax tissues of arterial walls 11) Tomatoes- can also improve cholesterol and reduce risk of heart disease 12) Broccoli- good source of magnesium, calcium, and potassium 13) Greek yogurt: high in potassium and calcium 14) Herbs and spices: Celery seed, cilantro, saffron, lemongrass, black cumin, ginseng, cinnamon, cardamom, sweet basil, and ginger 15) Chia and flax seeds- also help to lower cholesterol and blood sugar 16) Beets- high levels of nitrates that relax blood vessels  17) spinach and bananas- high in potassium  Insomnia: -Try to go outside near sunrise -Get exercise during the day.  -Discussed good sleep hygiene: turning off all devices an hour before bedtime.  -Chamomile tea with dinner.  -Can consider over the counter melatonin -try pistachios and tart cherry juic

## 2021-04-18 ENCOUNTER — Ambulatory Visit
Admission: RE | Admit: 2021-04-18 | Discharge: 2021-04-18 | Disposition: A | Discharge status: Home / Self Care | Payer: Medicare Other | Source: Ambulatory Visit | Attending: Internal Medicine | Admitting: Internal Medicine

## 2021-04-18 ENCOUNTER — Other Ambulatory Visit: Payer: Self-pay | Admitting: Internal Medicine

## 2021-04-18 ENCOUNTER — Other Ambulatory Visit: Payer: Self-pay

## 2021-04-18 DIAGNOSIS — N6312 Unspecified lump in the right breast, upper inner quadrant: Secondary | ICD-10-CM | POA: Diagnosis not present

## 2021-04-18 DIAGNOSIS — Z9882 Breast implant status: Secondary | ICD-10-CM | POA: Diagnosis not present

## 2021-04-18 DIAGNOSIS — N6310 Unspecified lump in the right breast, unspecified quadrant: Secondary | ICD-10-CM

## 2021-05-01 ENCOUNTER — Telehealth: Payer: Self-pay | Admitting: *Deleted

## 2021-05-01 NOTE — Telephone Encounter (Signed)
PER PTS MAMMO REPORT BX WAS RECOMMENDED, HOWEVER PT WANTED TO TALK TO HER MD 1ST AND STATED SHE WILL CALL THE OFFICE IF SHE DECIDES SHE WANTS TO SCHEDULE THE BREAST BIOPSY.

## 2021-05-21 ENCOUNTER — Other Ambulatory Visit: Payer: Self-pay | Admitting: Student

## 2021-05-21 ENCOUNTER — Ambulatory Visit
Admission: RE | Admit: 2021-05-21 | Discharge: 2021-05-21 | Disposition: A | Discharge status: Home / Self Care | Payer: Medicare Other | Source: Ambulatory Visit | Attending: Student | Admitting: Student

## 2021-05-21 DIAGNOSIS — M7989 Other specified soft tissue disorders: Secondary | ICD-10-CM

## 2021-05-21 DIAGNOSIS — M79605 Pain in left leg: Secondary | ICD-10-CM
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
## 2021-08-22 ENCOUNTER — Ambulatory Visit: Payer: Self-pay | Admitting: Urology

## 2021-08-22 ENCOUNTER — Ambulatory Visit: Payer: Medicare Other | Admitting: Urology

## 2021-08-22 ENCOUNTER — Encounter: Payer: Self-pay | Admitting: Urology

## 2021-09-21 ENCOUNTER — Other Ambulatory Visit (INDEPENDENT_AMBULATORY_CARE_PROVIDER_SITE_OTHER): Payer: Self-pay | Admitting: Vascular Surgery

## 2021-10-02 ENCOUNTER — Ambulatory Visit: Payer: Self-pay | Admitting: Student

## 2021-10-05 NOTE — Progress Notes (Signed)
Surgical Instructions    Your procedure is scheduled on Wednesday, February 8th, 2023.   Report to [facility] Main Entrance "A" at 06:30 A.M., then check in with the Admitting office.  Call this number if you have problems the morning of surgery:  [phone]   If you have any questions prior to your surgery date call [phone]: Open Monday-Friday 8am-4pm    Remember:  Do not eat after midnight the night before your surgery  You may drink clear liquids until 05:30 the morning of your surgery.   Clear liquids allowed are: Water, Non-Citrus Juices (without pulp), Carbonated Beverages, Clear Tea, Black Coffee ONLY (NO MILK, CREAM OR POWDERED CREAMER of any kind), and Gatorade  Patient Instructions  The day of surgery (if you have diabetes): Drink ONE (1) 12 oz G2 given to you in your pre admission testing appointment by 05:30 the morning of surgery. Drink in one sitting. Do not sip.  This drink was given to you during your hospital  pre-op appointment visit.  Nothing else to drink after completing the  12 oz bottle of G2.         If you have questions, please contact your surgeons office.     Take these medicines the morning of surgery with A SIP OF WATER:   levothyroxine (SYNTHROID) metoprolol succinate (TOPROL-XL) pantoprazole (PROTONIX) PARoxetine (PAXIL) rosuvastatin (CRESTOR) solifenacin (VESICARE) sucralfate (CARAFATE)  If needed:  albuterol (VENTOLIN HFA) nitroGLYCERIN (NITROSTAT)  Please bring all inhalers with you the day of surgery.     Follow your surgeon's instructions on when to stop Plavix.  If no instructions were given by your surgeon then you will need to call the office to get those instructions.     As of today, STOP taking any Aspirin (unless otherwise instructed by your surgeon) Aleve, Naproxen, Ibuprofen, Motrin, Advil, Goody's, BC's, all herbal medications, fish oil, and all vitamins.   WHAT DO I DO ABOUT MY DIABETES MEDICATION?    THE  NIGHT BEFORE SURGERY, take 6 units of insulin glargine (LANTUS SOLOSTAR) - 50% of your regular dose   If your CBG is greater than 220 mg/dL, you may take  of your sliding scale (correction) dose of insulin.   HOW TO MANAGE YOUR DIABETES BEFORE AND AFTER SURGERY  Why is it important to control my blood sugar before and after surgery? Improving blood sugar levels before and after surgery helps healing and can limit problems. A way of improving blood sugar control is eating a healthy diet by:  Eating less sugar and carbohydrates  Increasing activity/exercise  Talking with your doctor about reaching your blood sugar goals High blood sugars (greater than 180 mg/dL) can raise your risk of infections and slow your recovery, so you will need to focus on controlling your diabetes during the weeks before surgery. Make sure that the doctor who takes care of your diabetes knows about your planned surgery including the date and location.  How do I manage my blood sugar before surgery? Check your blood sugar at least 4 times a day, starting 2 days before surgery, to make sure that the level is not too high or low.  Check your blood sugar the morning of your surgery when you wake up and every 2 hours until you get to the Short Stay unit.  If your blood sugar is less than 70 mg/dL, you will need to treat for low blood sugar: Do not take insulin. Treat a low blood sugar (less than 70 mg/dL) with  cup of clear juice (cranberry or apple), 4 glucose tablets, OR glucose gel. Recheck blood sugar in 15 minutes after treatment (to make sure it is greater than 70 mg/dL). If your blood sugar is not greater than 70 mg/dL on recheck, call [phone] for further instructions. Report your blood sugar to the short stay nurse when you get to Short Stay.  If you are admitted to the hospital after surgery: Your blood sugar will be checked by the staff and you will probably be given insulin after surgery (instead of  oral diabetes medicines) to make sure you have good blood sugar levels. The goal for blood sugar control after surgery is 80-180 mg/dL.     The day of surgery:          Do not wear jewelry or makeup Do not wear lotions, powders, perfumes, or deodorant. Do not shave 48 hours prior to surgery.   Do not bring valuables to the hospital. Do not wear nail polish, gel polish, artificial nails, or any other type of covering on natural nails (fingers and toes) If you have artificial nails or gel coating that need to be removed by a nail salon, please have this removed prior to surgery. Artificial nails or gel coating may interfere with anesthesia's ability to adequately monitor your vital signs.              [place] is not responsible for any belongings or valuables.  Do NOT Smoke (Tobacco/Vaping)  24 hours prior to your procedure  If you use a CPAP at night, you may bring your mask for your overnight stay.   Contacts, glasses, hearing aids, dentures or partials may not be worn into surgery, please bring cases for these belongings   For patients admitted to the hospital, discharge time will be determined by your treatment team.   Patients discharged the day of surgery will not be allowed to drive home, and someone needs to stay with them for 24 hours.  NO VISITORS WILL BE ALLOWED IN PRE-OP WHERE PATIENTS ARE PREPPED FOR SURGERY.  ONLY 1 SUPPORT PERSON MAY BE PRESENT IN THE WAITING ROOM WHILE YOU ARE IN SURGERY.  IF YOU ARE TO BE ADMITTED, ONCE YOU ARE IN YOUR ROOM YOU WILL BE ALLOWED TWO (2) VISITORS. 1 (ONE) VISITOR MAY STAY OVERNIGHT BUT MUST ARRIVE TO THE ROOM BY 8pm.  Minor children may have two parents present. Special consideration for safety and communication needs will be reviewed on a case by case basis.  Special instructions:    Oral Hygiene is also important to reduce your risk of infection.  Remember - BRUSH YOUR TEETH THE MORNING OF SURGERY WITH YOUR REGULAR TOOTHPASTE   [facility]- Preparing For Surgery  Before surgery, you can play an important role. Because skin is not sterile, your skin needs to be as free of germs as possible. You can reduce the number of germs on your skin by washing with CHG (chlorahexidine gluconate) Soap before surgery.  CHG is an antiseptic cleaner which kills germs and bonds with the skin to continue killing germs even after washing.     Please do not use if you have an allergy to CHG or antibacterial soaps. If your skin becomes reddened/irritated stop using the CHG.  Do not shave (including legs and underarms) for at least 48 hours prior to first CHG shower. It is OK to shave your face.  Please follow these instructions carefully.     Shower the [employer] BEFORE SURGERY  and the MORNING OF SURGERY with CHG Soap.   If you chose to wash your hair, wash your hair first as usual with your normal shampoo. After you shampoo, rinse your hair and body thoroughly to remove the shampoo.  Then [employer] and genitals (private parts) with your normal soap and rinse thoroughly to remove soap.  After that Use CHG Soap as you would any other liquid soap. You can apply CHG directly to the skin and wash gently with a scrungie or a clean washcloth.   Apply the CHG Soap to your body ONLY FROM THE NECK DOWN.  Do not use on open wounds or open sores. Avoid contact with your eyes, ears, mouth and genitals (private parts). Wash Face and genitals (private parts)  with your normal soap.   Wash thoroughly, paying special attention to the area where your surgery will be performed.  Thoroughly rinse your body with warm water from the neck down.  DO NOT shower/wash with your normal soap after using and rinsing off the CHG Soap.  Pat yourself dry with a CLEAN TOWEL.  Wear CLEAN PAJAMAS to bed the night before surgery  Place CLEAN SHEETS on your bed the night before your surgery  DO NOT SLEEP WITH PETS.   Day of Surgery:  Take a shower with CHG soap. Wear  Clean/Comfortable clothing the morning of surgery Do not apply any deodorants/lotions.   Remember to brush your teeth WITH YOUR REGULAR TOOTHPASTE.   Please read over the following fact sheets that you were given.

## 2021-10-06 NOTE — H&P (Signed)
[facility] (OTS) H&P  Patient ID: Cassandra [name] MRN: [id number] DOB/AGE: Aug 24, 1933 88 y.o.  Reason for surgery: Left ankle fixation failure  HPI: Cassandra [name] is an 88 y.o. female with medical history significant for type 2 diabetes, diabetic polyneuropathy, essential hypertension, hyperlipidemia, hypothyroidism, history of breast cancer, former tobacco user, COPD, OSA, coronary artery disease, peripheral vascular disease, carotid artery occlusion status post carotid stent placement, gout presenting for surgery of left ankle.  Patient involved in [facility] in June 2022.  Sustained open left ankle fracture.  Initially underwent external fixation by on-call provider and subsequently ORIF of left ankle fracture and syndesmosis by Dr. Doreatha Martin on 02/26/2021.  Patient did well postoperatively even for the first 6 months following her procedure.  Had minimal pain and progressed well with her mobility.  Her main concern was continued swelling about the ankle and foot.  She presented to the OTS clinic at the end of January 2023 with increased pain in the foot/ankle as well as continued swelling.  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  She presents today for removal of ankle hardware and tibiotalar fusion.  Past Medical History:  Diagnosis Date   Acquired hypothyroidism 01/06/2014   Anxiety    Arthritis    B12 deficiency 02/15/2014   Benign essential hypertension 01/06/2014   Breast cancer [facility]) 1982   Right breast cancer - chemotherapy   CAD (coronary artery disease), native coronary artery 01/06/2014   Carotid artery calcification    Chronic airway obstruction, not elsewhere classified 01/06/2014   Depression    Diabetes mellitus without complication ([place])    History of kidney stones 2019   left ureteral stone   Incontinence of urine    Myocardial infarction [facility]) 1982   small infarct   Neuromuscular  disorder (H[facility])    restless legs   Pacemaker    Presence of permanent cardiac pacemaker 07/2016   Pure hypercholesterolemia 01/06/2014   Renal insufficiency    Skin cancer    Sleep apnea    uses cpap    Past Surgical History:  Procedure Laterality Date   APPENDECTOMY     APPLICATION OF WOUND VAC Left 02/24/2021   Procedure: APPLICATION OF WOUND VAC;  Surgeon: Rod Can, MD;  Location: [place];  Service: Orthopedics;  Laterality: Left;   AUGMENTATION MAMMAPLASTY Bilateral 1982/redo in 2014   prior mastectomy   CARDIAC CATHETERIZATION N/A 04/23/2016   Procedure: Left Heart Cath and Coronary Angiography;  Surgeon: Corey Skains, MD;  Location: [place] [facility];  Service: Cardiovascular;  Laterality: N/A;   CARDIAC CATHETERIZATION Left 04/23/2016   Procedure: Coronary Stent Intervention;  Surgeon: Yolonda Kida, MD;  Location: [place] [facility];  Service: Cardiovascular;  Laterality: Left;   CAROTID ARTERY ANGIOPLASTY Left 2010   had stent inserted and removed d/t infection. artery from leg inserted in left carotid   CAROTID ENDARTERECTOMY Left 2010   CHOLECYSTECTOMY     CORONARY ANGIOPLASTY WITH STENT PLACEMENT  september 9th 2017   CYSTOSCOPY W/ URETERAL STENT PLACEMENT Left 08/17/2017   Procedure: CYSTOSCOPY WITH RETROGRADE PYELOGRAM/URETERAL STENT PLACEMENT;  Surgeon: Festus Aloe, MD;  Location: [facility];  Service: Urology;  Laterality: Left;   CYSTOSCOPY W/ URETERAL STENT PLACEMENT Left 09/16/2017   Procedure: CYSTOSCOPY WITH STENT REPLACEMENT;  Surgeon: Abbie Sons, MD;  Location: [facility];  Service: Urology;  Laterality: Left;   CYSTOSCOPY/RETROGRADE/URETEROSCOPY/STONE EXTRACTION WITH BASKET Left 09/16/2017   Procedure: CYSTOSCOPY/RETROGRADE/URETEROSCOPY/STONE  EXTRACTION WITH BASKET;  Surgeon: Abbie Sons, MD;  Location: [facility];  Service: Urology;  Laterality: Left;   EXTERNAL FIXATION LEG Left 02/24/2021   Procedure: EXTERNAL FIXATION  ANKLE;  Surgeon:  Rod Can, MD;  Location: [place];  Service: Orthopedics;  Laterality: Left;   EXTERNAL FIXATION REMOVAL Left 02/26/2021   Procedure: REMOVAL EXTERNAL FIXATION LEG;  Surgeon: Shona Needles, MD;  Location: [place];  Service: Orthopedics;  Laterality: Left;   EYE SURGERY Bilateral    cataract extraction   I & D EXTREMITY Left 02/24/2021   Procedure: IRRIGATION AND DEBRIDEMENT EXTREMITY;  Surgeon: Rod Can, MD;  Location: [place];  Service: Orthopedics;  Laterality: Left;   KYPHOPLASTY N/A 02/03/2018   Procedure: [id number];  Surgeon: Hessie Knows, MD;  Location: [facility];  Service: Orthopedics;  Laterality: N/A;   MASTECTOMY Right 1982   ORIF ANKLE FRACTURE Left 02/26/2021   Procedure: OPEN REDUCTION INTERNAL FIXATION (ORIF) ANKLE FRACTURE;  Surgeon: Shona Needles, MD;  Location: [place];  Service: Orthopedics;  Laterality: Left;   PACEMAKER INSERTION Left 07/03/2016   Procedure: INSERTION PACEMAKER;  Surgeon: Isaias Cowman, MD;  Location: [facility];  Service: Cardiovascular;  Laterality: Left;   SKIN CANCER EXCISION     TUBAL LIGATION     VISCERAL ANGIOGRAPHY N/A 11/03/2019   Procedure: VISCERAL ANGIOGRAPHY;  Surgeon: Katha Cabal, MD;  Location: [place] [facility];  Service: Cardiovascular;  Laterality: N/A;    Family History  Problem Relation Age of Onset   Prostate cancer Neg Hx    Kidney cancer Neg Hx    Breast cancer Neg Hx     Social History:  reports that she quit smoking about 67 years ago. Her smoking use included cigarettes. She started smoking about 68 years ago. She smoked an average of .5 packs per day. She has never used smokeless tobacco. She reports that she does not currently use alcohol. She reports that she does not currently use drugs.  Allergies: No Known Allergies  Medications: I have reviewed the patient's current medications. Prior to Admission:  No medications prior to admission.    ROS: Constitutional: No fever or chills Vision: No  changes in vision ENT: No difficulty swallowing CV: No chest pain Pulm: No SOB or wheezing GI: No nausea or vomiting GU: No urgency or inability to hold urine Skin: No poor wound healing Neurologic: No numbness or tingling Psychiatric: No depression or anxiety Heme: No bruising Allergic: No reaction to medications or food   Exam: There were no vitals taken for this visit. General: No acute distress H&P Orientation: Alert and oriented x4 Mood and Affect: Mood and affect appropriate, pleasant and cooperative Gait: Within normal limits Coordination and balance: Within normal limits  LLE: Well-healed surgical incisions.  Swelling about the lower leg, ankle, foot.  Tolerates gentle ankle motion.  Tenderness laterally about the foot and ankle.  Sensation intact distally.  Neurovascularly intact  RLE: Skin without lesions. No tenderness to palpation. Full painless ROM, full strength in each muscle groups without evidence of instability.   Medical Decision Making: Data: Imaging: AP, lateral, mortise view of the left ankle shows lateral fixation in place. Both syndesmotic screws have backed out some.  The talus has shifted and compressed upwardly into the lateral tibiotalar joint. Fibula fracture appears healed.  Labs: No results found for this or any previous visit (from the past 168 hour(s)).  Assessment/Plan: 88 year old female status post ORIF of left trimalleolar ankle fracture and syndesmosis  02/26/2021  Patient has unfortunately developed post-traumatic arthritis if the lateral tibiotalar joint. Patient with continued swelling and pain about the ankle and foot.  At this point I would recommend proceeding with removal of hardware and subsequent ankle fusion.  Risks and benefits of procedure were discussed with the patient. Risks discussed included bleeding, infection, malunion, nonunion, damage to surrounding nerves and blood vessels, pain, hardware prominence or irritation, hardware  failure, stiffness, DVT/PE, compartment syndrome, and even anesthesia complications. She states her understanding of these risks and agrees to proceed with surgery.  All questions answered her satisfaction.  We will plan to discharge patient home postoperatively   Gwinda Passe PA-C [facility] [phone] (office) [email or web address]

## 2021-10-08 ENCOUNTER — Encounter (HOSPITAL_COMMUNITY)
Admission: RE | Admit: 2021-10-08 | Discharge: 2021-10-08 | Disposition: A | Discharge status: Home / Self Care | Payer: Medicare Other | Source: Ambulatory Visit | Attending: Student | Admitting: Student

## 2021-10-08 ENCOUNTER — Other Ambulatory Visit: Payer: Self-pay

## 2021-10-08 ENCOUNTER — Encounter (HOSPITAL_COMMUNITY): Payer: Self-pay

## 2021-10-08 VITALS — BP 138/59 | HR 59 | Temp 97.6°F | Resp 18 | Ht 59.0 in | Wt 155.7 lb

## 2021-10-08 DIAGNOSIS — E039 Hypothyroidism, unspecified: Secondary | ICD-10-CM | POA: Insufficient documentation

## 2021-10-08 DIAGNOSIS — Z01812 Encounter for preprocedural laboratory examination: Secondary | ICD-10-CM | POA: Insufficient documentation

## 2021-10-08 DIAGNOSIS — I252 Old myocardial infarction: Secondary | ICD-10-CM | POA: Insufficient documentation

## 2021-10-08 DIAGNOSIS — Z01818 Encounter for other preprocedural examination: Secondary | ICD-10-CM

## 2021-10-08 DIAGNOSIS — G4733 Obstructive sleep apnea (adult) (pediatric): Secondary | ICD-10-CM | POA: Insufficient documentation

## 2021-10-08 DIAGNOSIS — J449 Chronic obstructive pulmonary disease, unspecified: Secondary | ICD-10-CM | POA: Insufficient documentation

## 2021-10-08 DIAGNOSIS — I119 Hypertensive heart disease without heart failure: Secondary | ICD-10-CM | POA: Insufficient documentation

## 2021-10-08 DIAGNOSIS — Z87891 Personal history of nicotine dependence: Secondary | ICD-10-CM | POA: Insufficient documentation

## 2021-10-08 DIAGNOSIS — E7800 Pure hypercholesterolemia, unspecified: Secondary | ICD-10-CM | POA: Insufficient documentation

## 2021-10-08 DIAGNOSIS — I2510 Atherosclerotic heart disease of native coronary artery without angina pectoris: Secondary | ICD-10-CM | POA: Insufficient documentation

## 2021-10-08 DIAGNOSIS — Z8616 Personal history of COVID-19: Secondary | ICD-10-CM | POA: Insufficient documentation

## 2021-10-08 DIAGNOSIS — E119 Type 2 diabetes mellitus without complications: Secondary | ICD-10-CM | POA: Insufficient documentation

## 2021-10-08 HISTORY — DX: Chronic vascular disorders of intestine: K55.1

## 2021-10-08 HISTORY — DX: Cardiac arrhythmia, unspecified: I49.9

## 2021-10-08 LAB — CBC
HCT: 40 % (ref 36.0–46.0)
Hemoglobin: 12.2 g/dL (ref 12.0–15.0)
MCH: 27.2 pg (ref 26.0–34.0)
MCHC: 30.5 g/dL (ref 30.0–36.0)
MCV: 89.1 fL (ref 80.0–100.0)
Platelets: 302 10*3/uL (ref 150–400)
RBC: 4.49 MIL/uL (ref 3.87–5.11)
RDW: 16.7 % — ABNORMAL HIGH (ref 11.5–15.5)
WBC: 9.4 10*3/uL (ref 4.0–10.5)
nRBC: 0 % (ref 0.0–0.2)

## 2021-10-08 LAB — BASIC METABOLIC PANEL
Anion gap: 12 (ref 5–15)
BUN: 26 mg/dL — ABNORMAL HIGH (ref 8–23)
CO2: 25 mmol/L (ref 22–32)
Calcium: 9.4 mg/dL (ref 8.9–10.3)
Chloride: 99 mmol/L (ref 98–111)
Creatinine, Ser: 1.18 mg/dL — ABNORMAL HIGH (ref 0.44–1.00)
GFR, Estimated: 45 mL/min — ABNORMAL LOW (ref >60)
Glucose, Bld: 342 mg/dL — ABNORMAL HIGH (ref 70–99)
Potassium: 4.4 mmol/L (ref 3.5–5.1)
Sodium: 136 mmol/L (ref 135–145)

## 2021-10-08 LAB — HEMOGLOBIN A1C
Hgb A1c MFr Bld: 7.9 % — ABNORMAL HIGH (ref 4.8–5.6)
Mean Plasma Glucose: 180.03 mg/dL

## 2021-10-08 LAB — GLUCOSE, CAPILLARY: Glucose-Capillary: 363 mg/dL — ABNORMAL HIGH (ref 70–99)

## 2021-10-08 LAB — SURGICAL PCR SCREEN
MRSA, PCR: NEGATIVE
Staphylococcus aureus: NEGATIVE

## 2021-10-08 NOTE — Progress Notes (Addendum)
Mrs. Cassandra [name], Cassandra [name] will have surgery (Left ankle fusion) on 10/10/21 at [facility]. She has [employer] pacemaker and this Probation officer faxed a request for orders to Dr. Bartholome Bill on Friday, 10/05/21. Per his nurse, Nira Conn, Dr. Chrissie Noa is not in the office Friday-s and Monday-s but she will try to ask somebody else to take a look over patient's case and to send recommendation. Patient will be in short stay at 06:30 o'clock.  Rep [employer] was notified.

## 2021-10-08 NOTE — Progress Notes (Addendum)
PCP - Fulton Reek, MD Cardiologist - Bartholome Bill, MD  PPM/ICD - pacemaker Device Orders - fax was sent to Dr. Chrissie Noa at [facility] Notified - yes  Chest x-ray - 06.27/2022 EKG - 02/26/2021 Stress Test - denies ECHO - 02/27/2021 Cardiac Cath - 04/23/2016  Sleep Study - yes, positive for OSA.  CPAP - no - patient is using oxygen at bedtime, PRN  Fasting Blood Sugar - 80 -110 Checks Blood Sugar 3 times a day CBG today -363 A1C - 10/08/2021  Blood Thinner Instructions: Plavix - last dose - 10/03/21 per patient  Aspirin Instructions: Patient was instructed: As of today, STOP taking any Aspirin (unless otherwise instructed by your surgeon) Aleve, Naproxen, Ibuprofen, Motrin, Advil, Goody's, BC's, all herbal medications, fish oil, and all vitamins.  ERAS Protcol - yes PRE-SURGERY - G2- yes, until 05:30 o'clock  COVID TEST- no - ambulatory surgery   Anesthesia review: yes - cardiac history. CBG elevated in PAT. Patient verbalized that she didn't have her diabetes medicine today and she ate a biscuit with ham and she drank coffee with no sugar and orange juice. Patient was instructed to control her diet and to take her medication as prescribed; she was encouraged to check her CBG and if it will continue to be elevated even after she will take her diabetes medicine to contact PCP. Patient verbalized understanding.   Patient denies shortness of breath, fever, cough and chest pain at PAT appointment   All instructions explained to the patient, with a verbal understanding of the material. Patient agrees to go over the instructions while at home for a better understanding. Patient also instructed to self quarantine after being tested for COVID-19. The opportunity to ask questions was provided.

## 2021-10-09 ENCOUNTER — Encounter (HOSPITAL_COMMUNITY): Payer: Self-pay
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
## 2021-10-09 NOTE — Anesthesia Preprocedure Evaluation (Addendum)
Anesthesia Evaluation  Patient identified by MRN, date of birth, ID band Patient awake    Reviewed: Allergy & Precautions, NPO status , Patient's Chart, lab work & pertinent test results  History of Anesthesia Complications Negative for: history of anesthetic complications  Airway Mallampati: II  TM Distance: >3 FB Neck ROM: Full    Dental  (+) Dental Advisory Given   Pulmonary sleep apnea , COPD, former smoker,    breath sounds clear to auscultation       Cardiovascular hypertension, Pt. on medications and Pt. on home beta blockers (-) angina+ CAD, + Past MI, + Cardiac Stents and + Peripheral Vascular Disease  + dysrhythmias + pacemaker  Rhythm:Regular  1. Left ventricular ejection fraction, by estimation, is 60 to 65%. The  left ventricle has normal function. The left ventricle has no regional  wall motion abnormalities. There is mild concentric left ventricular  hypertrophy. Left ventricular diastolic  parameters are consistent with Grade II diastolic dysfunction  (pseudonormalization). Elevated left atrial pressure. There is the  interventricular septum is flattened in diastole ('D' shaped left  ventricle), consistent with right ventricular volume  overload.  2. Right ventricular systolic function is normal. The right ventricular  size is mildly enlarged. There is moderately elevated pulmonary artery  systolic pressure.  3. Left atrial size was mildly dilated.  4. Right atrial size was mildly dilated.  5. The mitral valve is normal in structure. No evidence of mitral valve  regurgitation.  6. Tricuspid valve regurgitation is severe.  7. The aortic valve is tricuspid. There is mild calcification of the  aortic valve. There is mild thickening of the aortic valve. Aortic valve  regurgitation is not visualized. Mild aortic valve sclerosis is present,  with no evidence of aortic valve  stenosis.  8. The inferior vena  cava is dilated in size with <50% respiratory  variability, suggesting right atrial pressure of 15 mmHg.   ? A STENT XIENCE ALPINE RX [id number] drug eluting stent was successfully placed, and does not overlap previously placed stent. ? Prox LAD lesion, 75 %stenosed. ? Post intervention, there is a 0% residual stenosis. ? Successful PCI and stent to proximal LAD with a 3.25 x 15 mm xience DES   Conclusion Successful PCI and stent without complication reducing lesion from 75 down to 0% with DES    Neuro/Psych  Headaches, PSYCHIATRIC DISORDERS Anxiety Depression  Neuromuscular disease    GI/Hepatic negative GI ROS, Neg liver ROS,   Endo/Other  diabetesHypothyroidism   Renal/GU Renal diseaseLab Results      Component                Value               Date                      CREATININE               0.99                10/13/2021                Musculoskeletal  (+) Arthritis ,   Abdominal   Peds  Hematology  (+) Blood dyscrasia, anemia , Lab Results      Component                Value               Date  WBC                      7.8                 10/12/2021                HGB                      11.0 (L)            10/12/2021                HCT                      35.7 (L)            10/12/2021                MCV                      89.5                10/12/2021                PLT                      205                 10/12/2021              Anesthesia Other Findings   Reproductive/Obstetrics                            Anesthesia Physical Anesthesia Plan  ASA: 3  Anesthesia Plan: MAC and Regional   Post-op Pain Management: Regional block   Induction: Intravenous  PONV Risk Score and Plan: 2 and Propofol infusion and Treatment may vary due to age or medical condition  Airway Management Planned: Nasal Cannula  Additional Equipment: None  Intra-op Plan:   Post-operative Plan:   Informed Consent: I have  reviewed the patients History and Physical, chart, labs and discussed the procedure including the risks, benefits and alternatives for the proposed anesthesia with the patient or authorized representative who has indicated his/her understanding and acceptance.     Dental advisory given  Plan Discussed with: CRNA and Anesthesiologist  Anesthesia Plan Comments: (PAT note written 10/09/2021 by Myra Gianotti, PA-C. )       Anesthesia Quick Evaluation

## 2021-10-10 ENCOUNTER — Encounter (HOSPITAL_COMMUNITY): Payer: Self-pay | Admitting: Student

## 2021-10-10 ENCOUNTER — Ambulatory Visit (HOSPITAL_COMMUNITY): Payer: Medicare Other | Admitting: Anesthesiology

## 2021-10-10 ENCOUNTER — Ambulatory Visit (HOSPITAL_COMMUNITY): Payer: Medicare Other

## 2021-10-10 ENCOUNTER — Other Ambulatory Visit: Payer: Self-pay

## 2021-10-10 ENCOUNTER — Observation Stay (HOSPITAL_COMMUNITY): Payer: Medicare Other

## 2021-10-10 ENCOUNTER — Inpatient Hospital Stay (HOSPITAL_COMMUNITY)
Admission: AD | Admit: 2021-10-10 | Discharge: 2021-10-16 | DRG: 493 | Disposition: A | Discharge status: Home Health | Payer: Medicare Other | Attending: Student | Admitting: Student

## 2021-10-10 ENCOUNTER — Ambulatory Visit (HOSPITAL_COMMUNITY): Payer: Medicare Other | Admitting: Vascular Surgery

## 2021-10-10 ENCOUNTER — Encounter (HOSPITAL_COMMUNITY)
Admission: AD | Disposition: A | Discharge status: Home / Self Care | Payer: Self-pay | Source: Home / Self Care | Attending: Student

## 2021-10-10 DIAGNOSIS — Z85828 Personal history of other malignant neoplasm of skin: Secondary | ICD-10-CM

## 2021-10-10 DIAGNOSIS — T847XXA Infection and inflammatory reaction due to other internal orthopedic prosthetic devices, implants and grafts, initial encounter: Principal | ICD-10-CM

## 2021-10-10 DIAGNOSIS — T148XXA Other injury of unspecified body region, initial encounter: Secondary | ICD-10-CM

## 2021-10-10 DIAGNOSIS — M19172 Post-traumatic osteoarthritis, left ankle and foot: Secondary | ICD-10-CM | POA: Diagnosis not present

## 2021-10-10 DIAGNOSIS — E7800 Pure hypercholesterolemia, unspecified: Secondary | ICD-10-CM | POA: Diagnosis present

## 2021-10-10 DIAGNOSIS — I2510 Atherosclerotic heart disease of native coronary artery without angina pectoris: Secondary | ICD-10-CM | POA: Diagnosis present

## 2021-10-10 DIAGNOSIS — E1142 Type 2 diabetes mellitus with diabetic polyneuropathy: Secondary | ICD-10-CM | POA: Diagnosis present

## 2021-10-10 DIAGNOSIS — Z853 Personal history of malignant neoplasm of breast: Secondary | ICD-10-CM

## 2021-10-10 DIAGNOSIS — G2581 Restless legs syndrome: Secondary | ICD-10-CM | POA: Diagnosis present

## 2021-10-10 DIAGNOSIS — I10 Essential (primary) hypertension: Secondary | ICD-10-CM | POA: Diagnosis present

## 2021-10-10 DIAGNOSIS — I252 Old myocardial infarction: Secondary | ICD-10-CM

## 2021-10-10 DIAGNOSIS — Z950 Presence of cardiac pacemaker: Secondary | ICD-10-CM

## 2021-10-10 DIAGNOSIS — D62 Acute posthemorrhagic anemia: Secondary | ICD-10-CM | POA: Diagnosis not present

## 2021-10-10 DIAGNOSIS — Z9011 Acquired absence of right breast and nipple: Secondary | ICD-10-CM

## 2021-10-10 DIAGNOSIS — J449 Chronic obstructive pulmonary disease, unspecified: Secondary | ICD-10-CM | POA: Diagnosis present

## 2021-10-10 DIAGNOSIS — M1990 Unspecified osteoarthritis, unspecified site: Secondary | ICD-10-CM | POA: Diagnosis present

## 2021-10-10 DIAGNOSIS — E039 Hypothyroidism, unspecified: Secondary | ICD-10-CM | POA: Diagnosis present

## 2021-10-10 DIAGNOSIS — T8469XA Infection and inflammatory reaction due to internal fixation device of other site, initial encounter: Principal | ICD-10-CM | POA: Diagnosis present

## 2021-10-10 DIAGNOSIS — G4733 Obstructive sleep apnea (adult) (pediatric): Secondary | ICD-10-CM | POA: Diagnosis present

## 2021-10-10 DIAGNOSIS — M109 Gout, unspecified: Secondary | ICD-10-CM | POA: Diagnosis present

## 2021-10-10 DIAGNOSIS — Z955 Presence of coronary angioplasty implant and graft: Secondary | ICD-10-CM

## 2021-10-10 DIAGNOSIS — Y793 Surgical instruments, materials and orthopedic devices (including sutures) associated with adverse incidents: Secondary | ICD-10-CM | POA: Diagnosis present

## 2021-10-10 DIAGNOSIS — M869 Osteomyelitis, unspecified: Secondary | ICD-10-CM

## 2021-10-10 DIAGNOSIS — Z87891 Personal history of nicotine dependence: Secondary | ICD-10-CM

## 2021-10-10 DIAGNOSIS — Z419 Encounter for procedure for purposes other than remedying health state, unspecified: Secondary | ICD-10-CM

## 2021-10-10 HISTORY — PX: ANKLE FUSION: SHX5718

## 2021-10-10 LAB — C-REACTIVE PROTEIN: CRP: 0.8 mg/dL (ref <1.0)

## 2021-10-10 LAB — CBC
HCT: 37.5 % (ref 36.0–46.0)
Hemoglobin: 12.1 g/dL (ref 12.0–15.0)
MCH: 28.1 pg (ref 26.0–34.0)
MCHC: 32.3 g/dL (ref 30.0–36.0)
MCV: 87.2 fL (ref 80.0–100.0)
Platelets: 254 10*3/uL (ref 150–400)
RBC: 4.3 MIL/uL (ref 3.87–5.11)
RDW: 16.7 % — ABNORMAL HIGH (ref 11.5–15.5)
WBC: 8.6 10*3/uL (ref 4.0–10.5)
nRBC: 0 % (ref 0.0–0.2)

## 2021-10-10 LAB — SEDIMENTATION RATE: Sed Rate: 70 mm/hr — ABNORMAL HIGH (ref 0–22)

## 2021-10-10 LAB — GLUCOSE, CAPILLARY
Glucose-Capillary: 126 mg/dL — ABNORMAL HIGH (ref 70–99)
Glucose-Capillary: 124 mg/dL — ABNORMAL HIGH (ref 70–99)
Glucose-Capillary: 170 mg/dL — ABNORMAL HIGH (ref 70–99)
Glucose-Capillary: 158 mg/dL — ABNORMAL HIGH (ref 70–99)

## 2021-10-10 LAB — HEMOGLOBIN A1C
Hgb A1c MFr Bld: 8 % — ABNORMAL HIGH (ref 4.8–5.6)
Mean Plasma Glucose: 182.9 mg/dL

## 2021-10-10 LAB — VITAMIN D 25 HYDROXY (VIT D DEFICIENCY, FRACTURES): Vit D, 25-Hydroxy: 29.29 ng/mL — ABNORMAL LOW (ref 30–100)

## 2021-10-10 SURGERY — ANKLE FUSION
Anesthesia: General | Site: Ankle | Laterality: Left

## 2021-10-10 MED ORDER — ENSURE ENLIVE PO LIQD
237.0000 mL | Freq: Two times a day (BID) | ORAL | Status: DC
  Administered 2021-10-10 – 2021-10-11 (×2): 237 mL via ORAL

## 2021-10-10 MED ORDER — QUETIAPINE FUMARATE 25 MG PO TABS
25.0000 mg | ORAL_TABLET | Freq: Every day | ORAL | Status: DC
  Administered 2021-10-10 – 2021-10-15 (×6): 25 mg via ORAL
  Filled 2021-10-10 (×6): qty 1

## 2021-10-10 MED ORDER — PHENYLEPHRINE 40 MCG/ML (10ML) SYRINGE FOR IV PUSH (FOR BLOOD PRESSURE SUPPORT)
PREFILLED_SYRINGE | INTRAVENOUS | Status: AC
  Filled 2021-10-10: qty 10

## 2021-10-10 MED ORDER — ONDANSETRON HCL 4 MG/2ML IJ SOLN
INTRAMUSCULAR | Status: AC
  Filled 2021-10-10: qty 2

## 2021-10-10 MED ORDER — ONDANSETRON HCL 4 MG/2ML IJ SOLN: 4.0000 mg | Freq: Four times a day (QID) | INTRAMUSCULAR | Status: DC | PRN

## 2021-10-10 MED ORDER — MELATONIN 3 MG PO TABS
3.0000 mg | ORAL_TABLET | Freq: Every evening | ORAL | Status: DC | PRN
  Administered 2021-10-13 – 2021-10-14 (×3): 3 mg via ORAL
  Filled 2021-10-10 (×3): qty 1

## 2021-10-10 MED ORDER — POLYETHYLENE GLYCOL 3350 17 G PO PACK: 17.0000 g | PACK | Freq: Every day | ORAL | Status: DC | PRN

## 2021-10-10 MED ORDER — ACETAMINOPHEN 500 MG PO TABS: 1000.0000 mg | ORAL_TABLET | Freq: Once | ORAL | Status: DC | PRN

## 2021-10-10 MED ORDER — HYDROCODONE-ACETAMINOPHEN 7.5-325 MG PO TABS: 1.0000 | ORAL_TABLET | ORAL | Status: DC | PRN

## 2021-10-10 MED ORDER — HYDROCODONE-ACETAMINOPHEN 5-325 MG PO TABS: 1.0000 | ORAL_TABLET | ORAL | Status: DC | PRN

## 2021-10-10 MED ORDER — FENTANYL CITRATE (PF) 250 MCG/5ML IJ SOLN
INTRAMUSCULAR | Status: AC
  Filled 2021-10-10: qty 5

## 2021-10-10 MED ORDER — ROSUVASTATIN CALCIUM 5 MG PO TABS
10.0000 mg | ORAL_TABLET | Freq: Every day | ORAL | Status: DC
  Administered 2021-10-10 – 2021-10-16 (×7): 10 mg via ORAL
  Filled 2021-10-10 (×8): qty 2

## 2021-10-10 MED ORDER — METOCLOPRAMIDE HCL 5 MG PO TABS: 5.0000 mg | ORAL_TABLET | Freq: Three times a day (TID) | ORAL | Status: DC | PRN

## 2021-10-10 MED ORDER — ACETAMINOPHEN 160 MG/5ML PO SOLN: 1000.0000 mg | Freq: Once | ORAL | Status: DC | PRN

## 2021-10-10 MED ORDER — PHENYLEPHRINE HCL-NACL 20-0.9 MG/250ML-% IV SOLN
INTRAVENOUS | Status: DC | PRN
Start: 2021-10-10 — End: 2021-10-10
  Administered 2021-10-10: 20 ug/min via INTRAVENOUS

## 2021-10-10 MED ORDER — ONDANSETRON HCL 4 MG/2ML IJ SOLN
INTRAMUSCULAR | Status: DC | PRN
  Administered 2021-10-10: 4 mg via INTRAVENOUS

## 2021-10-10 MED ORDER — PAROXETINE HCL 10 MG PO TABS
10.0000 mg | ORAL_TABLET | Freq: Every day | ORAL | Status: DC
  Administered 2021-10-10 – 2021-10-16 (×7): 10 mg via ORAL
  Filled 2021-10-10 (×8): qty 1

## 2021-10-10 MED ORDER — 0.9 % SODIUM CHLORIDE (POUR BTL) OPTIME
TOPICAL | Status: DC | PRN
  Administered 2021-10-10: 1000 mL

## 2021-10-10 MED ORDER — SODIUM CHLORIDE 0.9 % IV SOLN: INTRAVENOUS | Status: DC

## 2021-10-10 MED ORDER — SODIUM CHLORIDE 0.9 % IV SOLN
500.0000 mg | INTRAVENOUS | Status: DC
  Filled 2021-10-10: qty 10

## 2021-10-10 MED ORDER — INSULIN ASPART 100 UNIT/ML IJ SOLN
0.0000 [IU] | Freq: Every day | INTRAMUSCULAR | Status: DC
  Administered 2021-10-12 – 2021-10-14 (×2): 2 [IU] via SUBCUTANEOUS

## 2021-10-10 MED ORDER — METOPROLOL SUCCINATE ER 25 MG PO TB24
12.5000 mg | ORAL_TABLET | Freq: Every day | ORAL | Status: DC
  Administered 2021-10-11 – 2021-10-16 (×6): 12.5 mg via ORAL
  Filled 2021-10-10 (×6): qty 1

## 2021-10-10 MED ORDER — PHENYLEPHRINE 40 MCG/ML (10ML) SYRINGE FOR IV PUSH (FOR BLOOD PRESSURE SUPPORT)
PREFILLED_SYRINGE | INTRAVENOUS | Status: DC | PRN
  Administered 2021-10-10: 40 ug via INTRAVENOUS
  Administered 2021-10-10: 80 ug via INTRAVENOUS

## 2021-10-10 MED ORDER — INSULIN ASPART 100 UNIT/ML IJ SOLN: 0.0000 [IU] | INTRAMUSCULAR | Status: DC | PRN

## 2021-10-10 MED ORDER — MIDAZOLAM HCL 2 MG/2ML IJ SOLN
INTRAMUSCULAR | Status: DC | PRN
  Administered 2021-10-10: 1 mg via INTRAVENOUS

## 2021-10-10 MED ORDER — VANCOMYCIN HCL 1000 MG IV SOLR
INTRAVENOUS | Status: AC
  Filled 2021-10-10: qty 20

## 2021-10-10 MED ORDER — SODIUM CHLORIDE 0.9 % IV SOLN
2.0000 g | INTRAVENOUS | Status: DC
  Administered 2021-10-10 – 2021-10-16 (×7): 2 g via INTRAVENOUS
  Filled 2021-10-10 (×8): qty 20

## 2021-10-10 MED ORDER — ALBUTEROL SULFATE (2.5 MG/3ML) 0.083% IN NEBU
2.5000 mg | INHALATION_SOLUTION | Freq: Four times a day (QID) | RESPIRATORY_TRACT | Status: DC | PRN
  Administered 2021-10-13 – 2021-10-15 (×2): 2.5 mg via RESPIRATORY_TRACT
  Filled 2021-10-10 (×2): qty 3

## 2021-10-10 MED ORDER — METOCLOPRAMIDE HCL 5 MG/ML IJ SOLN: 5.0000 mg | Freq: Three times a day (TID) | INTRAMUSCULAR | Status: DC | PRN

## 2021-10-10 MED ORDER — MIDAZOLAM HCL 2 MG/2ML IJ SOLN
INTRAMUSCULAR | Status: AC
  Filled 2021-10-10: qty 2

## 2021-10-10 MED ORDER — CLOPIDOGREL BISULFATE 75 MG PO TABS
75.0000 mg | ORAL_TABLET | Freq: Every day | ORAL | Status: DC
  Administered 2021-10-11 – 2021-10-16 (×6): 75 mg via ORAL
  Filled 2021-10-10 (×7): qty 1

## 2021-10-10 MED ORDER — ORAL CARE MOUTH RINSE: 15.0000 mL | Freq: Once | OROMUCOSAL | Status: AC

## 2021-10-10 MED ORDER — CEFAZOLIN SODIUM-DEXTROSE 2-4 GM/100ML-% IV SOLN
2.0000 g | INTRAVENOUS | Status: AC
  Administered 2021-10-10: 2 g via INTRAVENOUS
  Filled 2021-10-10: qty 100

## 2021-10-10 MED ORDER — MORPHINE SULFATE (PF) 2 MG/ML IV SOLN: 0.5000 mg | INTRAVENOUS | Status: DC | PRN

## 2021-10-10 MED ORDER — PROPOFOL 10 MG/ML IV BOLUS
INTRAVENOUS | Status: AC
  Filled 2021-10-10: qty 20

## 2021-10-10 MED ORDER — ROPIVACAINE HCL 7.5 MG/ML IJ SOLN
INTRAMUSCULAR | Status: DC | PRN
  Administered 2021-10-10: 10 mL via PERINEURAL

## 2021-10-10 MED ORDER — TOBRAMYCIN SULFATE 1.2 G IJ SOLR
INTRAMUSCULAR | Status: AC
  Filled 2021-10-10: qty 1.2

## 2021-10-10 MED ORDER — SODIUM CHLORIDE 0.9 % IV SOLN: 2.0000 g | INTRAVENOUS | Status: DC

## 2021-10-10 MED ORDER — FENTANYL CITRATE (PF) 250 MCG/5ML IJ SOLN
INTRAMUSCULAR | Status: DC | PRN
  Administered 2021-10-10: 50 ug via INTRAVENOUS

## 2021-10-10 MED ORDER — PANTOPRAZOLE SODIUM 40 MG PO TBEC
40.0000 mg | DELAYED_RELEASE_TABLET | Freq: Every day | ORAL | Status: DC
  Administered 2021-10-10 – 2021-10-16 (×7): 40 mg via ORAL
  Filled 2021-10-10 (×8): qty 1

## 2021-10-10 MED ORDER — MAGNESIUM OXIDE -MG SUPPLEMENT 400 (240 MG) MG PO TABS
400.0000 mg | ORAL_TABLET | Freq: Two times a day (BID) | ORAL | Status: DC
  Administered 2021-10-10 – 2021-10-16 (×12): 400 mg via ORAL
  Filled 2021-10-10 (×14): qty 1

## 2021-10-10 MED ORDER — SODIUM CHLORIDE 0.9 % IV SOLN
500.0000 mg | Freq: Once | INTRAVENOUS | Status: AC
  Administered 2021-10-10: 500 mg via INTRAVENOUS
  Filled 2021-10-10 (×2): qty 10

## 2021-10-10 MED ORDER — ACETAMINOPHEN 10 MG/ML IV SOLN: 1000.0000 mg | Freq: Once | INTRAVENOUS | Status: DC | PRN

## 2021-10-10 MED ORDER — NITROGLYCERIN 0.4 MG SL SUBL: 0.4000 mg | SUBLINGUAL_TABLET | SUBLINGUAL | Status: DC | PRN

## 2021-10-10 MED ORDER — LACTATED RINGERS IV SOLN: INTRAVENOUS | Status: DC

## 2021-10-10 MED ORDER — SODIUM CHLORIDE 0.9 % IR SOLN
Status: DC | PRN
  Administered 2021-10-10: 3000 mL

## 2021-10-10 MED ORDER — OXYCODONE HCL 5 MG/5ML PO SOLN: 5.0000 mg | Freq: Once | ORAL | Status: DC | PRN

## 2021-10-10 MED ORDER — CHLORHEXIDINE GLUCONATE 0.12 % MT SOLN
15.0000 mL | Freq: Once | OROMUCOSAL | Status: AC
  Administered 2021-10-10: 15 mL via OROMUCOSAL
  Filled 2021-10-10: qty 15

## 2021-10-10 MED ORDER — VANCOMYCIN HCL 1000 MG IV SOLR
INTRAVENOUS | Status: DC | PRN
  Administered 2021-10-10: 1000 mg via TOPICAL

## 2021-10-10 MED ORDER — ONDANSETRON HCL 4 MG PO TABS: 4.0000 mg | ORAL_TABLET | Freq: Four times a day (QID) | ORAL | Status: DC | PRN

## 2021-10-10 MED ORDER — BUPIVACAINE-EPINEPHRINE (PF) 0.5% -1:200000 IJ SOLN
INTRAMUSCULAR | Status: DC | PRN
  Administered 2021-10-10: 20 mL via PERINEURAL

## 2021-10-10 MED ORDER — TOBRAMYCIN SULFATE 1.2 G IJ SOLR
INTRAMUSCULAR | Status: DC | PRN
  Administered 2021-10-10: 1.2 g via TOPICAL

## 2021-10-10 MED ORDER — OXYCODONE HCL 5 MG PO TABS: 5.0000 mg | ORAL_TABLET | Freq: Once | ORAL | Status: DC | PRN

## 2021-10-10 MED ORDER — FENTANYL CITRATE (PF) 100 MCG/2ML IJ SOLN: 25.0000 ug | INTRAMUSCULAR | Status: DC | PRN

## 2021-10-10 MED ORDER — PROPOFOL 10 MG/ML IV BOLUS
INTRAVENOUS | Status: DC | PRN
Start: 2021-10-10 — End: 2021-10-10
  Administered 2021-10-10 (×2): 30 mg via INTRAVENOUS
  Administered 2021-10-10: 20 mg via INTRAVENOUS

## 2021-10-10 MED ORDER — ACETAMINOPHEN 325 MG PO TABS
325.0000 mg | ORAL_TABLET | Freq: Four times a day (QID) | ORAL | Status: DC | PRN
  Administered 2021-10-11 – 2021-10-13 (×3): 650 mg via ORAL
  Filled 2021-10-10 (×4): qty 2

## 2021-10-10 MED ORDER — ALBUTEROL SULFATE HFA 108 (90 BASE) MCG/ACT IN AERS: 2.0000 | INHALATION_SPRAY | Freq: Four times a day (QID) | RESPIRATORY_TRACT | Status: DC | PRN

## 2021-10-10 MED ORDER — INSULIN ASPART 100 UNIT/ML IJ SOLN
0.0000 [IU] | Freq: Three times a day (TID) | INTRAMUSCULAR | Status: DC
  Administered 2021-10-10 – 2021-10-12 (×5): 3 [IU] via SUBCUTANEOUS
  Administered 2021-10-12: 11 [IU] via SUBCUTANEOUS
  Administered 2021-10-13: 2 [IU] via SUBCUTANEOUS
  Administered 2021-10-13: 8 [IU] via SUBCUTANEOUS
  Administered 2021-10-13: 3 [IU] via SUBCUTANEOUS
  Administered 2021-10-14: 5 [IU] via SUBCUTANEOUS
  Administered 2021-10-14: 15 [IU] via SUBCUTANEOUS
  Administered 2021-10-15: 3 [IU] via SUBCUTANEOUS
  Administered 2021-10-15: 5 [IU] via SUBCUTANEOUS
  Administered 2021-10-15 – 2021-10-16 (×2): 3 [IU] via SUBCUTANEOUS

## 2021-10-10 MED ORDER — DOCUSATE SODIUM 100 MG PO CAPS
100.0000 mg | ORAL_CAPSULE | Freq: Two times a day (BID) | ORAL | Status: DC
  Administered 2021-10-10 – 2021-10-16 (×12): 100 mg via ORAL
  Filled 2021-10-10 (×13): qty 1

## 2021-10-10 MED ORDER — PROPOFOL 500 MG/50ML IV EMUL
INTRAVENOUS | Status: DC | PRN
  Administered 2021-10-10: 50 ug/kg/min via INTRAVENOUS

## 2021-10-10 MED ORDER — AMITRIPTYLINE HCL 10 MG PO TABS
10.0000 mg | ORAL_TABLET | Freq: Every day | ORAL | Status: DC
  Administered 2021-10-10 – 2021-10-15 (×6): 10 mg via ORAL
  Filled 2021-10-10 (×7): qty 1

## 2021-10-10 MED ORDER — DEXMEDETOMIDINE (PRECEDEX) IN NS 20 MCG/5ML (4 MCG/ML) IV SYRINGE
PREFILLED_SYRINGE | INTRAVENOUS | Status: AC
  Filled 2021-10-10: qty 5

## 2021-10-10 MED ORDER — ASPIRIN 325 MG PO TABS
325.0000 mg | ORAL_TABLET | Freq: Every day | ORAL | Status: DC
  Administered 2021-10-11 – 2021-10-16 (×6): 325 mg via ORAL
  Filled 2021-10-10 (×7): qty 1

## 2021-10-10 MED ORDER — VITAMIN B-12 1000 MCG PO TABS
1000.0000 ug | ORAL_TABLET | Freq: Every day | ORAL | Status: DC
  Administered 2021-10-11 – 2021-10-16 (×6): 1000 ug via ORAL
  Filled 2021-10-10 (×7): qty 1

## 2021-10-10 MED ORDER — LEVOTHYROXINE SODIUM 25 MCG PO TABS
125.0000 ug | ORAL_TABLET | Freq: Every day | ORAL | Status: DC
  Administered 2021-10-11 – 2021-10-16 (×6): 125 ug via ORAL
  Filled 2021-10-10 (×6): qty 1

## 2021-10-10 MED ORDER — LIDOCAINE HCL (PF) 2 % IJ SOLN
INTRAMUSCULAR | Status: DC | PRN
  Administered 2021-10-10: 100 mg via PERINEURAL

## 2021-10-10 SURGICAL SUPPLY — 56 items
BAG COUNTER SPONGE SURGICOUNT (BAG) ×2 IMPLANT
BANDAGE ESMARK 6X9 LF (GAUZE/BANDAGES/DRESSINGS) ×1 IMPLANT
BNDG COHESIVE 4X5 TAN STRL (GAUZE/BANDAGES/DRESSINGS) ×1 IMPLANT
BNDG ELASTIC 4X5.8 VLCR STR LF (GAUZE/BANDAGES/DRESSINGS) ×1 IMPLANT
BNDG ELASTIC 6X5.8 VLCR STR LF (GAUZE/BANDAGES/DRESSINGS) IMPLANT
BNDG ESMARK 6X9 LF (GAUZE/BANDAGES/DRESSINGS)
BRUSH SCRUB EZ PLAIN DRY (MISCELLANEOUS) ×4 IMPLANT
CHLORAPREP W/TINT 26 (MISCELLANEOUS) ×2 IMPLANT
COVER SURGICAL LIGHT HANDLE (MISCELLANEOUS) ×2 IMPLANT
DRAPE C-ARM 42X72 X-RAY (DRAPES) ×2 IMPLANT
DRAPE C-ARMOR (DRAPES) ×2 IMPLANT
DRAPE ORTHO SPLIT 77X108 STRL (DRAPES) ×4
DRAPE SURG ORHT 6 SPLT 77X108 (DRAPES) ×2 IMPLANT
DRAPE U-SHAPE 47X51 STRL (DRAPES) ×2 IMPLANT
DRSG ADAPTIC 3X8 NADH LF (GAUZE/BANDAGES/DRESSINGS) IMPLANT
DRSG MEPITEL 4X7.2 (GAUZE/BANDAGES/DRESSINGS) ×1 IMPLANT
DRSG PAD ABDOMINAL 8X10 ST (GAUZE/BANDAGES/DRESSINGS) ×1 IMPLANT
ELECT REM PT RETURN 9FT ADLT (ELECTROSURGICAL) ×2
ELECTRODE REM PT RTRN 9FT ADLT (ELECTROSURGICAL) ×1 IMPLANT
GAUZE SPONGE 4X4 12PLY STRL (GAUZE/BANDAGES/DRESSINGS) ×1 IMPLANT
GLOVE SURG ENC MOIS LTX SZ6.5 (GLOVE) ×6 IMPLANT
GLOVE SURG ENC MOIS LTX SZ7.5 (GLOVE) ×6 IMPLANT
GLOVE SURG UNDER POLY LF SZ6.5 (GLOVE) ×2 IMPLANT
GLOVE SURG UNDER POLY LF SZ7.5 (GLOVE) ×2 IMPLANT
GOWN STRL REUS W/ TWL LRG LVL3 (GOWN DISPOSABLE) ×2 IMPLANT
GOWN STRL REUS W/TWL LRG LVL3 (GOWN DISPOSABLE) ×4
HANDPIECE INTERPULSE COAX TIP (DISPOSABLE) ×2
IV NS IRRIG 3000ML ARTHROMATIC (IV SOLUTION) ×1 IMPLANT
KIT TURNOVER KIT B (KITS) ×2 IMPLANT
MANIFOLD NEPTUNE II (INSTRUMENTS) ×2 IMPLANT
NDL HYPO 21X1.5 SAFETY (NEEDLE) IMPLANT
NDL HYPO 25GX1X1/2 BEV (NEEDLE) ×1 IMPLANT
NEEDLE HYPO 21X1.5 SAFETY (NEEDLE) IMPLANT
NEEDLE HYPO 25GX1X1/2 BEV (NEEDLE) ×2 IMPLANT
NS IRRIG 1000ML POUR BTL (IV SOLUTION) ×2 IMPLANT
PACK TOTAL JOINT (CUSTOM PROCEDURE TRAY) ×2 IMPLANT
PAD ARMBOARD 7.5X6 YLW CONV (MISCELLANEOUS) ×4 IMPLANT
PAD CAST 4YDX4 CTTN HI CHSV (CAST SUPPLIES) IMPLANT
PADDING CAST COTTON 4X4 STRL (CAST SUPPLIES) ×2
PADDING CAST COTTON 6X4 STRL (CAST SUPPLIES) IMPLANT
SET HNDPC FAN SPRY TIP SCT (DISPOSABLE) IMPLANT
SPONGE T-LAP 18X18 ~~LOC~~+RFID (SPONGE) ×1 IMPLANT
STAPLER VISISTAT 35W (STAPLE) ×1 IMPLANT
SUCTION FRAZIER HANDLE 10FR (MISCELLANEOUS) ×2
SUCTION TUBE FRAZIER 10FR DISP (MISCELLANEOUS) ×1 IMPLANT
SUT ETHILON 3 0 PS 1 (SUTURE) ×4 IMPLANT
SUT PROLENE 0 CT (SUTURE) IMPLANT
SUT VIC AB 0 CT1 27 (SUTURE) ×2
SUT VIC AB 0 CT1 27XBRD ANBCTR (SUTURE) ×1 IMPLANT
SUT VIC AB 2-0 CT1 27 (SUTURE) ×4
SUT VIC AB 2-0 CT1 TAPERPNT 27 (SUTURE) ×2 IMPLANT
SYR CONTROL 10ML LL (SYRINGE) ×2 IMPLANT
TOWEL GREEN STERILE (TOWEL DISPOSABLE) ×4 IMPLANT
TOWEL GREEN STERILE FF (TOWEL DISPOSABLE) ×2 IMPLANT
UNDERPAD 30X36 HEAVY ABSORB (UNDERPADS AND DIAPERS) ×2 IMPLANT
WATER STERILE IRR 1000ML POUR (IV SOLUTION) ×2 IMPLANT

## 2021-10-10 NOTE — Plan of Care (Signed)

## 2021-10-10 NOTE — Evaluation (Signed)
Physical Therapy Evaluation Patient Details Name: Cassandra [name] MRN: [id number] DOB: Apr 16, 1933 Today's Date: 10/10/2021  History of Present Illness  88 y.o. female presents to [facility] hospital on /04/2022 for removal of L ankle hardware along with I&D 2/2 post-op infection. PMH includes DMII, HTN, HLD, breast cancer, COPD, OSA, CAD, PVD, gout.  Clinical Impression  Pt presents to PT with deficits in gait, balance, sensation, power, functional mobility. Pt with numbness in LLE, likely related to regional anesthesia, affecting gait and mobility. Pt requires assistance to transfer due to LE weakness initially, but demonstrates improvement with second transfer. Pt will benefit from aggressive mobilization in an effort to reduce falls risk and restore independence in mobility. Pt expresses the desire to discharge to SNF as her spouse works during the day and she is unable to identify any other caregiver support.     Recommendations for follow up therapy are one component of a multi-disciplinary discharge planning process, led by the attending physician.  Recommendations may be updated based on patient status, additional functional criteria and insurance authorization.  Follow Up Recommendations Skilled nursing-short term rehab (<3 hours/day)    Assistance Recommended at Discharge Intermittent Supervision/Assistance  Patient can return home with the following  A little help with walking and/or transfers;A little help with bathing/dressing/bathroom;Assistance with cooking/housework;Help with stairs or ramp for entrance    Equipment Recommendations None recommended by PT  Recommendations for Other Services       Functional Status Assessment Patient has had a recent decline in their functional status and demonstrates the ability to make significant improvements in function in a reasonable and predictable amount of time.     Precautions / Restrictions Precautions Precautions:  Fall Restrictions Weight Bearing Restrictions: Yes LLE Weight Bearing: Weight bearing as tolerated      Mobility  Bed Mobility Overal bed mobility: Needs Assistance Bed Mobility: Supine to Sit     Supine to sit: Supervision          Transfers Overall transfer level: Needs assistance Equipment used: Rolling walker (2 wheels) Transfers: Sit to/from Stand, Bed to chair/wheelchair/BSC Sit to Stand: Min assist   Step pivot transfers: Min guard       General transfer comment: minA to power up into standing from bed, cues for hand placement. L knee hyperextension noted during initial transfer attempt    Ambulation/Gait Ambulation/Gait assistance: Min assist Gait Distance (Feet): 8 Feet Assistive device: Rolling walker (2 wheels) Gait Pattern/deviations: Step-to pattern Gait velocity: reduced Gait velocity interpretation: <1.8 ft/sec, indicate of risk for recurrent falls   General Gait Details: pt with slowed step-to gait, LLE lagging behind at times with L foot drag, pt needing to look down at LLE when stepping due to sensation deficits  Stairs            Wheelchair Mobility    Modified Rankin (Stroke Patients Only)       Balance Overall balance assessment: Needs assistance Sitting-balance support: No upper extremity supported, Feet supported Sitting balance-Leahy Scale: Good     Standing balance support: Single extremity supported, Bilateral upper extremity supported, Reliant on assistive device for balance Standing balance-Leahy Scale: Poor                               Pertinent Vitals/Pain Pain Assessment Pain Assessment: No/denies pain    Home Living Family/patient expects to be discharged to:: Private residence Living Arrangements: Spouse/significant other Available Help at Discharge: Family;Available  PRN/intermittently (spouse works from [place]) Type of Home: Accident Access: [place]: One [place]: Conservation officer, nature (2 wheels);Rollator (4 wheels);BSC/3in1;Shower seat;Grab bars - toilet;Grab bars - tub/shower;Wheelchair - manual      Prior Function Prior Level of Function : Independent/Modified Independent             Mobility Comments: ambulating limited household distances with use of walker, limited recently due to L ankle pain       Hand Dominance   Dominant Hand: Left    Extremity/Trunk Assessment   Upper Extremity Assessment Upper Extremity Assessment: Overall WFL for tasks assessed    Lower Extremity Assessment Lower Extremity Assessment: LLE deficits/detail LLE Sensation: decreased light touch    Cervical / Trunk Assessment Cervical / Trunk Assessment: Normal  Communication   Communication: No difficulties  Cognition Arousal/Alertness: Awake/alert Behavior During Therapy: WFL for tasks assessed/performed Overall Cognitive Status: Within Functional Limits for tasks assessed                                          General Comments General comments (skin integrity, edema, etc.): sats in low 90s on 2L . Sports of bright red drainage noted from posterior ankle dressing at end of session, RN made aware and monitoring    Exercises     Assessment/Plan    PT Assessment Patient needs continued PT services  PT Problem List Decreased strength;Decreased activity tolerance;Decreased balance;Decreased mobility;Impaired sensation       PT Treatment Interventions DME instruction;Gait training;Stair training;Functional mobility training;Therapeutic activities;Therapeutic exercise;Balance training;Patient/family education;Neuromuscular re-education    PT Goals (Current goals can be found in the Care Plan section)  Acute Rehab PT Goals Patient Stated Goal: to regain the ability to ambulate for household and community distances PT Goal Formulation: With patient Time For Goal Achievement: 10/24/21 Potential to  Achieve Goals: Good    Frequency Min 5X/week     Co-evaluation               AM-PAC PT "6 Clicks" Mobility  Outcome Measure Help needed turning from your back to your side while in a flat bed without using bedrails?: A Little Help needed moving from lying on your back to sitting on the side of a flat bed without using bedrails?: A Little Help needed moving to and from a bed to a chair (including a wheelchair)?: A Little Help needed standing up from a chair using your arms (e.g., wheelchair or bedside chair)?: A Little Help needed to walk in hospital room?: Total Help needed climbing 3-5 steps with a railing? : Total 6 Click Score: 14    End of Session   Activity Tolerance: Patient tolerated treatment well Patient left: in chair;with call bell/phone within reach;with chair alarm set;with family/visitor present Nurse Communication: Mobility status PT Visit Diagnosis: Other abnormalities of gait and mobility (R26.89);Muscle weakness (generalized) (M62.81)    Time: 0865-7846 PT Time Calculation (min) (ACUTE ONLY): 32 min   Charges:   PT Evaluation $PT Eval Low Complexity: [place], PT, DPT Acute Rehabilitation Pager: [phone] Office [phone]   Zenaida Niece 10/10/2021, 4:23 PM

## 2021-10-10 NOTE — Progress Notes (Signed)
S/p Removal of hardware left ankle. Ok to resume plavix on POD1. Rx will sign off.   Onnie Boer, PharmD, BCIDP, AAHIVP, CPP Infectious Disease Pharmacist 10/10/2021 12:43 PM
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
## 2021-10-10 NOTE — Transfer of Care (Signed)
Immediate Anesthesia Transfer of Care Note  Patient: Cassandra [name]  Procedure(s) Performed: ANKLE FUSION (Left: Ankle)  Patient Location: PACU  Anesthesia Type:MAC and Regional  Level of Consciousness: drowsy  Airway & Oxygen Therapy: Patient Spontanous Breathing  Post-op Assessment: Report given to RN and Post -op Vital signs reviewed and stable  Post vital signs: Reviewed and stable  Last Vitals:  Vitals Value Taken Time  BP 148/77 10/10/21 0949  Temp    Pulse    Resp 17 10/10/21 0949  SpO2    Vitals shown include unvalidated device data.  Last Pain:  Vitals:   10/10/21 0731  TempSrc:   PainSc: 0-No pain         Complications: No notable events documented.

## 2021-10-10 NOTE — Progress Notes (Signed)
Patient was initially planned for outpatient surgery.  Unfortunately due to the nonweightbearing status and her inability to find someone to stay with her 24/7 she will be unable to discharge immediately postoperatively.  We will plan to have her placed under observation status and work with physical therapy.  Shona Needles, MD [facility] [phone] (office) [email or web address]
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
## 2021-10-10 NOTE — Plan of Care (Signed)

## 2021-10-10 NOTE — Interval H&P Note (Signed)
History and Physical Interval Note:  10/10/2021 8:19 AM  Cassandra [name]  has presented today for surgery, with the diagnosis of Post traumatic arthritis.  The various methods of treatment have been discussed with the patient and family. After consideration of risks, benefits and other options for treatment, the patient has consented to  Procedure(s): ANKLE FUSION (Left) as a surgical intervention.  The patient's history has been reviewed, patient examined, no change in status, stable for surgery.  I have reviewed the patient's chart and labs.  Questions were answered to the patient's satisfaction.     Lennette Bihari P Belisa Eichholz

## 2021-10-10 NOTE — Anesthesia Procedure Notes (Signed)
Anesthesia Regional Block: Adductor canal block   Pre-Anesthetic Checklist: , timeout performed,  Correct Patient, Correct Site, Correct Laterality,  Correct Procedure, Correct Position, site marked,  Risks and benefits discussed,  Surgical consent,  Pre-op evaluation,  At surgeon's request and post-op pain management  Laterality: Left and Lower  Prep: chloraprep       Needles:  Injection technique: Single-shot      Needle Length: 9cm  Needle Gauge: 22     Additional Needles: Arrow StimuQuik ECHO Echogenic Stimulating PNB Needle  Procedures:,,,, ultrasound used (permanent image in chart),,    Narrative:  Start time: 10/10/2021 8:15 AM End time: 10/10/2021 8:20 AM Injection made incrementally with aspirations every 5 mL.  Performed by: Personally  Anesthesiologist: Oleta Mouse, MD

## 2021-10-10 NOTE — Progress Notes (Signed)
Pharmacy Antibiotic Note  Cassandra [name] is a 88 y.o. female admitted on 10/10/2021 with R-ankle PJI s/p I&D and removal of hardware with cultures pending. Pharmacy has been consulted to start empiric Daptomycin + Ceftriaxone/   SCr 1.18, CrCl~25-30 ml/min.   Plan: - Start Daptomycin 500 mg (~7 mg/kg) x 1 dose now then every 48 hours - Start Rocephin 2g IV every 24 hours - Will continue to follow renal function, culture results, LOT, and antibiotic de-escalation plans   Height: 4\' 11"  (149.9 cm) Weight: 70.6 kg (155 lb 11.2 oz) IBW/kg (Calculated) : 43.2  Temp (24hrs), Avg:97.7 F (36.5 C), Min:97.6 F (36.4 C), Max:97.8 F (36.6 C)  Recent Labs  Lab 10/08/21 1543  WBC 9.4  CREATININE 1.18*    Estimated Creatinine Clearance: 28.7 mL/min (A) (by C-G formula based on SCr of 1.18 mg/dL (H)).    No Known Allergies  Antimicrobials this admission: Daptomycin 2/8 >> Ceftriaxone 2/8 >>  Dose adjustments this admission: N/a  Microbiology results: 2/8 OR intra-op cx >>  Thank you for allowing pharmacy to be a part of this patients care.  [name], PharmD, BCPS Infectious Diseases Clinical Pharmacist 10/10/2021 10:44 AM   **Pharmacist phone directory can now be found on [email or web address] ([email or web address]).  Listed under [place].

## 2021-10-10 NOTE — Anesthesia Procedure Notes (Signed)
Anesthesia Regional Block: Popliteal block   Pre-Anesthetic Checklist: , timeout performed,  Correct Patient, Correct Site, Correct Laterality,  Correct Procedure, Correct Position, site marked,  Risks and benefits discussed,  Surgical consent,  Pre-op evaluation,  At surgeon's request and post-op pain management  Laterality: Left and Lower  Prep: chloraprep       Needles:  Injection technique: Single-shot      Needle Length: 5cm  Needle Gauge: 22     Additional Needles: Arrow StimuQuik ECHO Echogenic Stimulating PNB Needle  Procedures:,,,, ultrasound used (permanent image in chart),,    Narrative:  Start time: 10/10/2021 8:21 AM End time: 10/10/2021 8:26 AM Injection made incrementally with aspirations every 5 mL.  Performed by: Personally  Anesthesiologist: Oleta Mouse, MD

## 2021-10-11 ENCOUNTER — Encounter (HOSPITAL_COMMUNITY): Payer: Self-pay | Admitting: Student

## 2021-10-11 ENCOUNTER — Observation Stay: Payer: Self-pay

## 2021-10-11 DIAGNOSIS — I10 Essential (primary) hypertension: Secondary | ICD-10-CM | POA: Diagnosis present

## 2021-10-11 DIAGNOSIS — E7800 Pure hypercholesterolemia, unspecified: Secondary | ICD-10-CM | POA: Diagnosis present

## 2021-10-11 DIAGNOSIS — M19172 Post-traumatic osteoarthritis, left ankle and foot: Secondary | ICD-10-CM | POA: Diagnosis present

## 2021-10-11 DIAGNOSIS — Z955 Presence of coronary angioplasty implant and graft: Secondary | ICD-10-CM | POA: Diagnosis not present

## 2021-10-11 DIAGNOSIS — Z85828 Personal history of other malignant neoplasm of skin: Secondary | ICD-10-CM | POA: Diagnosis not present

## 2021-10-11 DIAGNOSIS — M1990 Unspecified osteoarthritis, unspecified site: Secondary | ICD-10-CM | POA: Diagnosis present

## 2021-10-11 DIAGNOSIS — T847XXD Infection and inflammatory reaction due to other internal orthopedic prosthetic devices, implants and grafts, subsequent encounter: Secondary | ICD-10-CM | POA: Diagnosis not present

## 2021-10-11 DIAGNOSIS — Z853 Personal history of malignant neoplasm of breast: Secondary | ICD-10-CM | POA: Diagnosis not present

## 2021-10-11 DIAGNOSIS — Z950 Presence of cardiac pacemaker: Secondary | ICD-10-CM | POA: Diagnosis not present

## 2021-10-11 DIAGNOSIS — I252 Old myocardial infarction: Secondary | ICD-10-CM | POA: Diagnosis not present

## 2021-10-11 DIAGNOSIS — E039 Hypothyroidism, unspecified: Secondary | ICD-10-CM | POA: Diagnosis present

## 2021-10-11 DIAGNOSIS — G2581 Restless legs syndrome: Secondary | ICD-10-CM | POA: Diagnosis present

## 2021-10-11 DIAGNOSIS — Z87891 Personal history of nicotine dependence: Secondary | ICD-10-CM | POA: Diagnosis not present

## 2021-10-11 DIAGNOSIS — J449 Chronic obstructive pulmonary disease, unspecified: Secondary | ICD-10-CM | POA: Diagnosis present

## 2021-10-11 DIAGNOSIS — G4733 Obstructive sleep apnea (adult) (pediatric): Secondary | ICD-10-CM | POA: Diagnosis present

## 2021-10-11 DIAGNOSIS — T8469XA Infection and inflammatory reaction due to internal fixation device of other site, initial encounter: Secondary | ICD-10-CM | POA: Diagnosis present

## 2021-10-11 DIAGNOSIS — E1142 Type 2 diabetes mellitus with diabetic polyneuropathy: Secondary | ICD-10-CM | POA: Diagnosis present

## 2021-10-11 DIAGNOSIS — Z9011 Acquired absence of right breast and nipple: Secondary | ICD-10-CM | POA: Diagnosis not present

## 2021-10-11 DIAGNOSIS — Y793 Surgical instruments, materials and orthopedic devices (including sutures) associated with adverse incidents: Secondary | ICD-10-CM | POA: Diagnosis present

## 2021-10-11 DIAGNOSIS — D62 Acute posthemorrhagic anemia: Secondary | ICD-10-CM | POA: Diagnosis not present

## 2021-10-11 DIAGNOSIS — M109 Gout, unspecified: Secondary | ICD-10-CM | POA: Diagnosis present

## 2021-10-11 DIAGNOSIS — M869 Osteomyelitis, unspecified: Secondary | ICD-10-CM | POA: Diagnosis not present

## 2021-10-11 DIAGNOSIS — I2510 Atherosclerotic heart disease of native coronary artery without angina pectoris: Secondary | ICD-10-CM | POA: Diagnosis present

## 2021-10-11 LAB — BASIC METABOLIC PANEL
Anion gap: 10 (ref 5–15)
BUN: 17 mg/dL (ref 8–23)
CO2: 28 mmol/L (ref 22–32)
Calcium: 8.7 mg/dL — ABNORMAL LOW (ref 8.9–10.3)
Chloride: 100 mmol/L (ref 98–111)
Creatinine, Ser: 1.04 mg/dL — ABNORMAL HIGH (ref 0.44–1.00)
GFR, Estimated: 52 mL/min — ABNORMAL LOW (ref >60)
Glucose, Bld: 180 mg/dL — ABNORMAL HIGH (ref 70–99)
Potassium: 3.7 mmol/L (ref 3.5–5.1)
Sodium: 138 mmol/L (ref 135–145)

## 2021-10-11 LAB — CBC
HCT: 33.6 % — ABNORMAL LOW (ref 36.0–46.0)
Hemoglobin: 10.5 g/dL — ABNORMAL LOW (ref 12.0–15.0)
MCH: 27.4 pg (ref 26.0–34.0)
MCHC: 31.3 g/dL (ref 30.0–36.0)
MCV: 87.7 fL (ref 80.0–100.0)
Platelets: 227 10*3/uL (ref 150–400)
RBC: 3.83 MIL/uL — ABNORMAL LOW (ref 3.87–5.11)
RDW: 16.7 % — ABNORMAL HIGH (ref 11.5–15.5)
WBC: 7.6 10*3/uL (ref 4.0–10.5)
nRBC: 0 % (ref 0.0–0.2)

## 2021-10-11 LAB — GLUCOSE, CAPILLARY
Glucose-Capillary: 180 mg/dL — ABNORMAL HIGH (ref 70–99)
Glucose-Capillary: 188 mg/dL — ABNORMAL HIGH (ref 70–99)
Glucose-Capillary: 195 mg/dL — ABNORMAL HIGH (ref 70–99)
Glucose-Capillary: 163 mg/dL — ABNORMAL HIGH (ref 70–99)

## 2021-10-11 LAB — CK: Total CK: 47 U/L (ref 38–234)

## 2021-10-11 MED ORDER — HYDROCODONE-ACETAMINOPHEN 5-325 MG PO TABS
1.0000 | ORAL_TABLET | ORAL | Status: DC | PRN
  Administered 2021-10-13: 2 via ORAL
  Administered 2021-10-14 – 2021-10-16 (×2): 1 via ORAL
  Filled 2021-10-11: qty 1
  Filled 2021-10-11: qty 2
  Filled 2021-10-11 (×2): qty 1

## 2021-10-11 MED ORDER — ADULT MULTIVITAMIN W/MINERALS CH
1.0000 | ORAL_TABLET | Freq: Every day | ORAL | Status: DC
  Administered 2021-10-11 – 2021-10-16 (×6): 1 via ORAL
  Filled 2021-10-11 (×6): qty 1

## 2021-10-11 MED ORDER — SODIUM CHLORIDE 0.9 % IV SOLN
500.0000 mg | Freq: Every day | INTRAVENOUS | Status: DC
  Administered 2021-10-11 – 2021-10-15 (×5): 500 mg via INTRAVENOUS
  Filled 2021-10-11 (×7): qty 10
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
## 2021-10-11 NOTE — Plan of Care (Signed)

## 2021-10-11 NOTE — Progress Notes (Signed)
Orthopaedic Trauma Progress Note  SUBJECTIVE: Reports  minimal  to no pain about operative site. Has not required any medications for pain. Feels like she can move her ankle and toes better without having pain. No chest pain. No SOB. No nausea/vomiting. No other complaints. Cultures with no growth to date. Patient thinks it would be best for her to go to SNF for rehab at discharge.   OBJECTIVE:  Vitals:   10/10/21 2354 10/11/21 0426  BP: 126/62 (!) 157/57  Pulse: 77 74  Resp: 17 18  Temp:  98.2 F (36.8 C)  SpO2: 100% 95%    General: Sitting up in bedside chair, NAD Respiratory: No increased work of breathing.  LLE: Dressing CDI. Able to wiggle toes. Tolerate gentle ankle motion without increase in pain. Foot warm and well perfused. +DP pulse  IMAGING: Stable post op imaging.   LABS:  Results for orders placed or performed during the hospital encounter of 10/10/21 (from the past 24 hour(s))  VITAMIN D 25 Hydroxy (Vit-D Deficiency, Fractures)     Status: Abnormal   Collection Time: 10/10/21  1:18 PM  Result Value Ref Range   Vit D, 25-Hydroxy 29.29 (L) 30 - 100 ng/mL  Sedimentation rate     Status: Abnormal   Collection Time: 10/10/21  1:18 PM  Result Value Ref Range   Sed Rate 70 (H) 0 - 22 mm/hr  C-reactive protein     Status: None   Collection Time: 10/10/21  1:18 PM  Result Value Ref Range   CRP 0.8 <1.0 mg/dL  Hemoglobin A1c     Status: Abnormal   Collection Time: 10/10/21  1:18 PM  Result Value Ref Range   Hgb A1c MFr Bld 8.0 (H) 4.8 - 5.6 %   Mean Plasma Glucose 182.9 mg/dL  CBC     Status: Abnormal   Collection Time: 10/10/21  1:18 PM  Result Value Ref Range   WBC 8.6 4.0 - 10.5 K/uL   RBC 4.30 3.87 - 5.11 MIL/uL   Hemoglobin 12.1 12.0 - 15.0 g/dL   HCT 37.5 36.0 - 46.0 %   MCV 87.2 80.0 - 100.0 fL   MCH 28.1 26.0 - 34.0 pg   MCHC 32.3 30.0 - 36.0 g/dL   RDW 16.7 (H) 11.5 - 15.5 %   Platelets 254 150 - 400 K/uL   nRBC 0.0 0.0 - 0.2 %  Glucose, capillary      Status: Abnormal   Collection Time: 10/10/21  4:08 PM  Result Value Ref Range   Glucose-Capillary 170 (H) 70 - 99 mg/dL  Glucose, capillary     Status: Abnormal   Collection Time: 10/10/21  7:46 PM  Result Value Ref Range   Glucose-Capillary 158 (H) 70 - 99 mg/dL  Basic metabolic panel     Status: Abnormal   Collection Time: 10/11/21  3:07 AM  Result Value Ref Range   Sodium 138 135 - 145 mmol/L   Potassium 3.7 3.5 - 5.1 mmol/L   Chloride 100 98 - 111 mmol/L   CO2 28 22 - 32 mmol/L   Glucose, Bld 180 (H) 70 - 99 mg/dL   BUN 17 8 - 23 mg/dL   Creatinine, Ser 1.04 (H) 0.44 - 1.00 mg/dL   Calcium 8.7 (L) 8.9 - 10.3 mg/dL   GFR, Estimated 52 (L) >60 mL/min   Anion gap 10 5 - 15  CBC     Status: Abnormal   Collection Time: 10/11/21  3:07 AM  Result Value  Ref Range   WBC 7.6 4.0 - 10.5 K/uL   RBC 3.83 (L) 3.87 - 5.11 MIL/uL   Hemoglobin 10.5 (L) 12.0 - 15.0 g/dL   HCT 33.6 (L) 36.0 - 46.0 %   MCV 87.7 80.0 - 100.0 fL   MCH 27.4 26.0 - 34.0 pg   MCHC 31.3 30.0 - 36.0 g/dL   RDW 16.7 (H) 11.5 - 15.5 %   Platelets 227 150 - 400 K/uL   nRBC 0.0 0.0 - 0.2 %  CK     Status: None   Collection Time: 10/11/21  3:07 AM  Result Value Ref Range   Total CK 47 38 - 234 U/L  Glucose, capillary     Status: Abnormal   Collection Time: 10/11/21  6:23 AM  Result Value Ref Range   Glucose-Capillary 180 (H) 70 - 99 mg/dL    ASSESSMENT: Cassandra [name] is a 88 y.o. female, 1 Day Post-Op s/p IRRIGATION & DEBRIDEMENT LEFT ANKL WITH HARDWARE REMOVAL   CV/Blood loss: Acute blood loss anemia, Hgb 10.5 this morning. Hemodynamically stable  PLAN: Weightbearing: WBAT LLE ROM: Ok for unrestricted range of motion Incisional and dressing care: OK to remove dressings 10/12/21 and leave open to air with dry gauze PRN  Showering: Ok to shower, keep ankle dressing dry Orthopedic device(s): None  Pain management:  1. Tylenol 325-650 mg q 6 hours PRN 2. Norco 5-325 mg  mg q 4 hours PRN VTE prophylaxis:  Aspirin and Plavix , SCDs ID: Ceftriaxone + Daptomycin Foley/Lines: No foley, KVO IVFs Impediments to Fracture Healing: Vit D level 29, continue D3 supplementation Dispo: Therapies as tolerated, PT/OT recommending SNF. Awaiting cultures. Appreciate assistance from ID. Patient will require ankle fusion on delayed basis, likely 6-8 weeks  D/C recommendations: - Hydrocodone for pain control - Aspirin for DVT prophylaxis - Continue Vit D supplementation  Follow - up plan: 2 weeks   Contact information:  [name] MD, Rushie Nyhan PA-C. After hours and holidays please check [email or web address] for group call information for [facility]   Gwinda Passe, PA-C [phone] (office) [email or web address]
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
## 2021-10-11 NOTE — Progress Notes (Signed)
° ° °  [place] for Infectious Disease         Unable to have PICC line placed secondary to mastectomy site on right side and pacer on left side. Will have to arrange for IR central line placement.    Terri Piedra, NP [place] for Infectious Disease [facility]  10/11/2021  12:00 PM

## 2021-10-11 NOTE — Progress Notes (Signed)
Initial Nutrition Assessment  DOCUMENTATION CODES:  Obesity unspecified  INTERVENTION:  Recommend liberalizing diet to regular.  Discontinue Ensure BID.  Add MVI with minerals daily.  Encourage PO intake.  NUTRITION DIAGNOSIS:  Increased nutrient needs related to post-op healing as evidenced by estimated needs.  GOAL:  Patient will meet greater than or equal to 90% of their needs  MONITOR:  PO intake, Supplement acceptance, Labs, Weight trends, Skin, I & O's  REASON FOR ASSESSMENT:  Malnutrition Screening Tool    ASSESSMENT:  88 yo female with a PMH of T2DM, diabetic polyneuropathy, essential HTN, HLD, hypothyroidism, history of breast cancer, former tobacco user, COPD, OSA, CAD, PVD, carotid artery occlusion s/p carotid stent placement, and gout presenting for surgery of left ankle. 2/8 - L ankle fusion  RD working remotely. Spoke with pt on room phone.  Pt reports that her diet has been whatever she likes at home, and she has had very little issues with her blood sugar control and her primary doctors are not concerned per her report.  No meal intakes have been documented.  Pt reports she had lost weight about 8 months ago, but she reported that she was happy to have lost the weight. She reports that the weight has since stayed off and has had no changes since.  Per Epic, pt's weight has increased since 04/2021.  Of note, pt with non-pitting LLE edema.  Pt requested no more Ensures be sent to her room. RD to discontinue these per pt request.  Pt requested a liberalized diet, as she eats a regular diet at home. RD to message MD regarding this given her age.  Supplements: Ensure BID  Medications: reviewed; colace BID, SSI, Synthroid, Mag-Ox BID, Vitamin B12, NaCl @ 50 ml/hr, IV ABX x2  Labs: reviewed; CBG 124-188 (H) HbA1c: 8% (10/10/2021)  NUTRITION - FOCUSED PHYSICAL EXAM: Unable to perform - defer to in-person follow-up  Diet Order:   Diet Order              Diet Heart Room service appropriate? Yes; Fluid consistency: Thin  Diet effective now                  EDUCATION NEEDS:  No education needs have been identified at this time  Skin:  Skin Assessment: Skin Integrity Issues: Skin Integrity Issues:: Incisions Incisions: L ankle (2/8)  Last BM:  no BM documented  Height:  Ht Readings from Last 1 Encounters:  10/10/21 4\' 11"  (1.499 m)   Weight:  Wt Readings from Last 1 Encounters:  10/10/21 70.6 kg   BMI:  Body mass index is 31.45 kg/m.  Estimated Nutritional Needs:  Kcal:  1800-2000 Protein:  95-110 grams Fluid:  >1.8 L  Derrel Nip, RD, LDN (she/her/hers) Clinical Inpatient Dietitian RD Pager/After-Hours/Weekend Pager # in [place]

## 2021-10-11 NOTE — Progress Notes (Signed)
Request received for tunneled central line placement, patient is not a candidate for PICC due to mastectomy on right ans pacer on left.   Chart checked, patient expected d/c date 2/13 and she had a PIV.   The procedure is scheduled for tomorrow 10/12/21.  Please call IR for questions and concerns.   Armando Gang Iana Buzan PA-C 10/11/2021 4:25 PM

## 2021-10-11 NOTE — Evaluation (Signed)
Occupational Therapy Evaluation Patient Details Name: Cassandra [name] MRN: [id number] DOB: [birth year] Today's Date: 10/11/2021   History of Present Illness 88 y.o. female [facility] to [facility] hospital on /04/[facility] for removal of L ankle hardware along with I&D 2/2 post-op infection. PMH includes DMII, HTN, HLD, breast cancer, COPD, OSA, CAD, PVD, gout.   Clinical Impression   Pt recently started using a RW, but was functioning modified independently in self care. She lives with her husband who works part time. Pt is overall functioning at up to a supervision level in mobility and ADLs with RW. She is requesting SNF upon discharge.      Recommendations for follow up therapy are one component of a multi-disciplinary discharge planning process, led by the attending physician.  Recommendations may be updated based on patient status, additional functional criteria and insurance authorization.   Follow Up Recommendations  Skilled nursing-short term rehab (<3 hours/day)    Assistance Recommended at Discharge Intermittent Supervision/Assistance  Patient can return home with the following A little help with walking and/or transfers;A little help with bathing/dressing/bathroom;Assistance with cooking/housework;Assist for transportation;Help with stairs or ramp for entrance    Functional Status Assessment  Patient has had a recent decline in their functional status and demonstrates the ability to make significant improvements in function in a reasonable and predictable amount of time.  Equipment Recommendations  None recommended by OT    Recommendations for Other Services       Precautions / Restrictions Precautions Precautions: Fall Restrictions Weight Bearing Restrictions: Yes LLE Weight Bearing: Weight bearing as tolerated      Mobility Bed Mobility Overal bed mobility: Modified Independent             General bed mobility comments: HOB up    Transfers Overall transfer level:  Needs assistance Equipment used: Rolling walker (2 wheels) Transfers: Sit to/from Stand Sit to Stand: Supervision           General transfer comment: no physical assist, pt needing cues to control descent to chair      Balance Overall balance assessment: Needs assistance   Sitting balance-Leahy Scale: Normal     Standing balance support: Bilateral upper extremity supported Standing balance-Leahy Scale: Poor                             ADL either performed or assessed with clinical judgement   ADL Overall ADL's : Needs assistance/impaired Eating/Feeding: Independent;Sitting   Grooming: Supervision/safety;Standing;Wash/dry hands   Upper Body Bathing: Set up;Sitting   Lower Body Bathing: Supervison/ safety;Sit to/from stand   Upper Body Dressing : Set up;Sitting   Lower Body Dressing: Supervision/safety;Sit to/from stand   Toilet Transfer: Supervision/safety;Ambulation;Rolling walker (2 wheels);Comfort height toilet   Toileting- Clothing Manipulation and Hygiene: Supervision/safety;Sit to/from stand       Functional mobility during ADLs: Supervision/safety;Rolling walker (2 wheels)       Vision Ability to See in Adequate Light: 0 Adequate Patient Visual Report: No change from baseline       Perception     Praxis      Pertinent Vitals/Pain Pain Assessment Pain Assessment: Faces Faces Pain Scale: Hurts a little bit Pain Location: L ankle Pain Descriptors / Indicators: Sore Pain Intervention(s): Monitored during session     Hand Dominance Left   Extremity/Trunk Assessment Upper Extremity Assessment Upper Extremity Assessment: Overall WFL for tasks assessed   Lower Extremity Assessment Lower Extremity Assessment: Defer to PT evaluation  Cervical / Trunk Assessment Cervical / Trunk Assessment: Normal   Communication Communication Communication: No difficulties   Cognition Arousal/Alertness: Awake/alert Behavior During Therapy: WFL  for tasks assessed/performed Overall Cognitive Status: Within Functional Limits for tasks assessed                                       General Comments       Exercises     Shoulder Instructions      Home Living Family/patient expects to be discharged to:: Private residence Living Arrangements: Spouse/significant other Available Help at Discharge: Family;Available PRN/intermittently Type of Home: House Home Access: Ramped entrance     Home Layout: One level     Bathroom Shower/Tub: Teacher, early years/pre: Standard     Home Equipment: Conservation officer, nature (2 wheels);Rollator (4 wheels);BSC/3in1;Shower seat;Grab bars - toilet;Grab bars - tub/shower;Wheelchair - manual          Prior Functioning/Environment Prior Level of Function : Independent/Modified Independent             Mobility Comments: ambulating limited household distances with use of walker, limited recently due to L ankle pain ADLs Comments: modified independent        OT Problem List: Impaired balance (sitting and/or standing)      OT Treatment/Interventions: Self-care/ADL training    OT Goals(Current goals can be found in the care plan section) Acute Rehab OT Goals OT Goal Formulation: With patient Time For Goal Achievement: 10/25/21 Potential to Achieve Goals: Good ADL Goals Pt Will Perform Grooming: with modified independence;standing Pt Will Perform Lower Body Bathing: with modified independence;sit to/from stand Pt Will Perform Lower Body Dressing: with modified independence;sit to/from stand Pt Will Transfer to Toilet: with modified independence;ambulating;bedside commode (over toilet) Pt Will Perform Toileting - Clothing Manipulation and hygiene: with modified independence;sit to/from stand  OT Frequency: Min 1X/week    Co-evaluation              AM-PAC OT "6 Clicks" Daily Activity     Outcome Measure Help from another person eating meals?: None Help  from another person taking care of personal grooming?: A Little Help from another person toileting, which includes using toliet, bedpan, or urinal?: A Little Help from another person bathing (including washing, rinsing, drying)?: A Little Help from another person to put on and taking off regular upper body clothing?: None Help from another person to put on and taking off regular lower body clothing?: A Little 6 Click Score: 20   End of Session Equipment Utilized During Treatment: Gait belt;Rolling walker (2 wheels)  Activity Tolerance: Patient tolerated treatment well Patient left: in chair;with call bell/phone within reach;with chair alarm set;Other (comment) (PA in room)  OT Visit Diagnosis: Other abnormalities of gait and mobility (R26.89)                Time: 5329-9242 OT Time Calculation (min): 16 min Charges:  OT General Charges $OT Visit: 1 Visit  Nestor Lewandowsky, OTR/L Acute Rehabilitation Services Pager: [phone] Office: [phone]   Malka So 10/11/2021, 11:42 AM

## 2021-10-11 NOTE — Progress Notes (Signed)
Terri Piedra, NP with infectious disease updated that patient is not a candidate for bedside PICC as she has a left side pacemaker and right side mastectomy.  [place], NP to cancel order and contact IR for line placement.

## 2021-10-11 NOTE — Progress Notes (Signed)
[place] for Infectious Disease  Date of Admission:  10/10/2021     Total days of antibiotics 2         ASSESSMENT:  Ms. Cassandra [name] is POD #1 from left ankle fusion with concern for infection. Surgical specimens obtained with no organisms on gram stain and no growth on cultures. Will place PICC line for prolonged antibiotics. Monitor cultures for organism identification and will narrow antibiotics as appropriate. For now will continue with broad spectrum coverage with Daptomycin and ceftriaxone. Will need to continue monitoring CK levels while on daptomycin. Continue post-operative care per Dr. Verlon Setting.   PLAN:  Continue daptomycin and ceftriaxone.  Monitor cultures for organism identification and narrow antibiotics as able.  PICC line placement. Remaining medical and supportive care per primary team.    Principal Problem:   Post-traumatic arthritis of left ankle    amitriptyline  10 mg Oral QHS   aspirin  325 mg Oral Daily   clopidogrel  75 mg Oral Daily   docusate sodium  100 mg Oral BID   feeding supplement  237 mL Oral BID BM   insulin aspart  0-15 Units Subcutaneous TID WC   insulin aspart  0-5 Units Subcutaneous QHS   levothyroxine  125 mcg Oral Q0600   magnesium oxide  400 mg Oral BID   metoprolol succinate  12.5 mg Oral Daily   pantoprazole  40 mg Oral Daily   PARoxetine  10 mg Oral Daily   QUEtiapine  25 mg Oral QHS   rosuvastatin  10 mg Oral Daily   cyanocobalamin  1,000 mcg Oral Daily    SUBJECTIVE:  Afebrile overnight with no acute events. Has questions about the infection. No new concerns/complaints.   No Known Allergies   Review of Systems: Review of Systems  Constitutional:  Negative for chills, fever and weight loss.  Respiratory:  Negative for cough, shortness of breath and wheezing.   Cardiovascular:  Negative for chest pain and leg swelling.  Gastrointestinal:  Negative for abdominal pain, constipation, diarrhea, nausea and vomiting.   Skin:  Negative for rash.     OBJECTIVE: Vitals:   10/10/21 2221 10/10/21 2354 10/11/21 0426 10/11/21 0755  BP:  126/62 (!) 157/57 136/76  Pulse:  77 74 78  Resp:  17 18 17   Temp:   98.2 F (36.8 C) 98.6 F (37 C)  TempSrc:    Oral  SpO2: 94% 100% 95% 98%  Weight:      Height:       Body mass index is 31.45 kg/m.  Physical Exam Constitutional:      General: She is not in acute distress.    Appearance: She is well-developed.  Cardiovascular:     Rate and Rhythm: Normal rate and regular rhythm.     Heart sounds: Normal heart sounds.  Pulmonary:     Effort: Pulmonary effort is normal.     Breath sounds: Normal breath sounds.  Skin:    General: Skin is warm and dry.  Neurological:     Mental Status: She is alert and oriented to person, place, and time.  Psychiatric:        Behavior: Behavior normal.        Thought Content: Thought content normal.        Judgment: Judgment normal.    Lab Results Lab Results  Component Value Date   WBC 7.6 10/11/2021   HGB 10.5 (L) 10/11/2021   HCT 33.6 (L) 10/11/2021   MCV 87.7  10/11/2021   PLT 227 10/11/2021    Lab Results  Component Value Date   CREATININE 1.04 (H) 10/11/2021   BUN 17 10/11/2021   NA 138 10/11/2021   K 3.7 10/11/2021   CL 100 10/11/2021   CO2 28 10/11/2021    Lab Results  Component Value Date   ALT 9 03/19/2021   AST 17 03/19/2021   ALKPHOS 87 03/19/2021   BILITOT 0.7 03/19/2021     Microbiology: Recent Results (from the past 240 hour(s))  Surgical PCR Screen     Status: None   Collection Time: 10/08/21  3:43 PM   Specimen: Nasal Mucosa; Nasal Swab  Result Value Ref Range Status   MRSA, PCR NEGATIVE NEGATIVE Final   Staphylococcus aureus NEGATIVE NEGATIVE Final    Comment: (NOTE) The Xpert SA Assay (FDA approved for NASAL specimens in patients 50 years of age and older), is one component of a comprehensive surveillance program. It is not intended to diagnose infection nor to guide or  monitor treatment. Performed at [facility], [place] [address], [place], [place] [address]   Aerobic/Anaerobic Culture w Gram Stain (surgical/deep wound)     Status: None (Preliminary result)   Collection Time: 10/10/21  9:08 AM   Specimen: PATH Soft tissue  Result Value Ref Range Status   Specimen Description TISSUE  Final   Special Requests 1 LEFT ANLKE TISSUE SPEC A  Final   Gram Stain   Final    RARE WBC PRESENT,BOTH PMN AND MONONUCLEAR NO ORGANISMS SEEN    Culture   Final    NO GROWTH < 24 HOURS Performed at [facility], [place] [address] [place], [place] [address]    Report Status PENDING  Incomplete  Aerobic/Anaerobic Culture w Gram Stain (surgical/deep wound)     Status: None (Preliminary result)   Collection Time: 10/10/21  9:12 AM   Specimen: PATH Soft tissue  Result Value Ref Range Status   Specimen Description TISSUE  Final   Special Requests 2 LEFT ANLE TISSUE SPEC B  Final   Gram Stain   Final    FEW WBC PRESENT,BOTH PMN AND MONONUCLEAR NO ORGANISMS SEEN    Culture   Final    NO GROWTH < 24 HOURS Performed at [facility], [place] [address] [place], [place] [address]    Report Status PENDING  Incomplete  Aerobic/Anaerobic Culture w Gram Stain (surgical/deep wound)     Status: None (Preliminary result)   Collection Time: 10/10/21  9:14 AM   Specimen: PATH Soft tissue  Result Value Ref Range Status   Specimen Description TISSUE  Final   Special Requests 3 LEFT ANKLE TISSUE SPEC C  Final   Gram Stain NO WBC SEEN NO ORGANISMS SEEN   Final   Culture   Final    NO GROWTH < 24 HOURS Performed at [facility], [place] [address] [place], [place] [address]    Report Status PENDING  Incomplete     Terri Piedra, [place] [facility]  10/11/2021  11:31 AM

## 2021-10-11 NOTE — Progress Notes (Signed)
Physical Therapy Treatment Patient Details Name: Cassandra [name] MRN: [id number] DOB: 12/23/1932 Today's Date: 10/11/2021   History of Present Illness 88 y.o. female presents to [facility] hospital on /04/2022 for removal of L ankle hardware along with I&D 2/2 post-op infection. PMH includes DMII, HTN, HLD, breast cancer, COPD, Oxygen use at home PRN at night, OSA, CAD, PVD, gout.    PT Comments    Pt received in chair, agreeable to therapy session and with good participation and improved tolerance for mobility progression today. Pt progressed to min guard for safety cues for stand>sit transfer due to consistent poor eccentric control when sitting, min guard progressing to Supervision for gait with good RW management and improved activity tolerance today. Encouraged ankle pumps hourly while up in chair and to get up frequently during the day with staff assist for mobility. Pt adamantly requesting SNF as spouse works ~6 hours a day and pt reports no one else available during the day to assist her. Pt continues to benefit from PT services to progress toward functional mobility goals. Frequency adjusted per discussion with supervising PT Ryan L per pt progress and plan for post-acute low intensity rehab.   Recommendations for follow up therapy are one component of a multi-disciplinary discharge planning process, led by the attending physician.  Recommendations may be updated based on patient status, additional functional criteria and insurance authorization.  Follow Up Recommendations  Skilled nursing-short term rehab (<3 hours/day)     Assistance Recommended at Discharge Intermittent Supervision/Assistance  Patient can return home with the following A little help with walking and/or transfers;A little help with bathing/dressing/bathroom;Assistance with cooking/housework;Help with stairs or ramp for entrance   Equipment Recommendations  None recommended by PT    Recommendations for Other Services        Precautions / Restrictions Precautions Precautions: Fall Restrictions Weight Bearing Restrictions: Yes LLE Weight Bearing: Weight bearing as tolerated     Mobility  Bed Mobility       General bed mobility comments: received in chair    Transfers Overall transfer level: Needs assistance Equipment used: Rolling walker (2 wheels) Transfers: Sit to/from Stand Sit to Stand: Supervision, Min guard           General transfer comment: no physical assist to stand, min guard and pt needing cues to control descent to chair and low toilet height, poor eccentric control to sit x4 total reps    Ambulation/Gait Ambulation/Gait assistance: Supervision, Min guard Gait Distance (Feet): 102 Feet Assistive device: Rolling walker (2 wheels) Gait Pattern/deviations: Step-through pattern Gait velocity: reduced Gait velocity interpretation: <1.8 ft/sec, indicate of risk for recurrent falls   General Gait Details: pt with slow speed, decreased B foot clearance, mildly increased WOB with exertion however poor pleth signal obtained on finger sensor during mobility so unable to accurately assess     Balance Overall balance assessment: Needs assistance Sitting-balance support: No upper extremity supported, Feet supported Sitting balance-Leahy Scale: Normal     Standing balance support: No upper extremity supported, Reliant on assistive device for balance Standing balance-Leahy Scale: Fair (Fair static standing balance unsupported during peri-care; poor dynamic balance reliant on RW for gait) Standing balance comment: no LOB using RW for mobility         Cognition Arousal/Alertness: Awake/alert Behavior During Therapy: WFL for tasks assessed/performed Overall Cognitive Status: Within Functional Limits for tasks assessed         General Comments: decreased safety awareness for stand>sit transfers (pt tending to plop) and needs  reminder for safe hand placement with transfer to low  toilet seat, pt not following cues for railing use; pt A&O but distracted/tangential at times.        Exercises Other Exercises Other Exercises: seated BLE AROM: ankle pumps x10 reps (encouraged hourly)    General Comments General comments (skin integrity, edema, etc.): SpO2 95% on RA at rest pre/post exertion, sensor placed on ear with best signal, no signal obtained during gait (fingernail polish and cold fingertips); DOE 1/4 with exertion      Pertinent Vitals/Pain Pain Assessment Pain Assessment: 0-10 Pain Score: 2  Pain Location: L ankle and low back pain Pain Descriptors / Indicators: Sore, Discomfort Pain Intervention(s): Monitored during session, Repositioned (pt defers pain meds)    Home Living Family/patient expects to be discharged to:: Private residence Living Arrangements: Spouse/significant other Available Help at Discharge: Family;Available PRN/intermittently Type of Home: House Home Access: Ramped entrance       Home Layout: One level Home Equipment: Conservation officer, nature (2 wheels);Rollator (4 wheels);BSC/3in1;Shower seat;Grab bars - toilet;Grab bars - tub/shower;Wheelchair - manual          PT Goals (current goals can now be found in the care plan section) Acute Rehab PT Goals Patient Stated Goal: to regain the ability to ambulate for household and community distances PT Goal Formulation: With patient Time For Goal Achievement: 10/24/21 Progress towards PT goals: Progressing toward goals    Frequency    Min 3X/week      PT Plan Frequency needs to be updated       AM-PAC PT "6 Clicks" Mobility   Outcome Measure  Help needed turning from your back to your side while in a flat bed without using bedrails?: A Little (did not assess, anticipate min cues) Help needed moving from lying on your back to sitting on the side of a flat bed without using bedrails?: A Little Help needed moving to and from a bed to a chair (including a wheelchair)?: A Little Help  needed standing up from a chair using your arms (e.g., wheelchair or bedside chair)?: A Lot (mod cues for safe hand placement) Help needed to walk in hospital room?: A Little Help needed climbing 3-5 steps with a railing? : Total 6 Click Score: 15    End of Session Equipment Utilized During Treatment: Gait belt Activity Tolerance: Patient tolerated treatment well Patient left: in chair;with call bell/phone within reach;with chair alarm set Nurse Communication: Mobility status PT Visit Diagnosis: Other abnormalities of gait and mobility (R26.89);Muscle weakness (generalized) (M62.81)     Time: 7948-0165 PT Time Calculation (min) (ACUTE ONLY): 38 min  Charges:  $Gait Training: 23-37 mins $Therapeutic Activity: 8-22 mins                     Cloud Graham P., PTA Acute Rehabilitation Services Pager: [phone] Office: [place] 10/11/2021, 12:40 PM

## 2021-10-11 NOTE — Care Management Obs Status (Signed)
[facility] NOTIFICATION   Patient Details  Name: Cassandra [name] MRN: [id number] Date of Birth: 1933/01/24   Medicare Observation Status Notification Given:  Yes    Joanne Chars, LCSW 10/11/2021, 10:13 AM

## 2021-10-11 NOTE — Progress Notes (Signed)
Pharmacy Antibiotic Note  Cassandra [name] is a 88 y.o. female admitted on 10/10/2021 with R-ankle PJI s/p I&D and removal of hardware with cultures pending. Pharmacy has been consulted to start empiric Daptomycin + Ceftriaxone/   SCr down to 1.04, CrCl~30-35 ml/min.   Plan: - Adjust Daptomycin to 500 mg (~7 mg/kg) every 24 hours - Continue Rocephin 2g IV every 24 hours - Will continue to follow renal function, culture results, LOT, and antibiotic de-escalation plans   Height: 4\' 11"  (149.9 cm) Weight: 70.6 kg (155 lb 11.2 oz) IBW/kg (Calculated) : 43.2  Temp (24hrs), Avg:97.9 F (36.6 C), Min:97.4 F (36.3 C), Max:98.6 F (37 C)  Recent Labs  Lab 10/08/21 1543 10/10/21 1318 10/11/21 0307  WBC 9.4 8.6 7.6  CREATININE 1.18*  --  1.04*     Estimated Creatinine Clearance: 32.6 mL/min (A) (by C-G formula based on SCr of 1.04 mg/dL (H)).    No Known Allergies  Antimicrobials this admission: Daptomycin 2/8 >> Ceftriaxone 2/8 >>  Dose adjustments this admission: N/a  Microbiology results: 2/8 OR intra-op cx >>  Thank you for allowing pharmacy to be a part of this patients care.  [name], PharmD, BCPS Infectious Diseases Clinical Pharmacist 10/11/2021 1:14 PM   **Pharmacist phone directory can now be found on [email or web address] ([email or web address]).  Listed under [place].

## 2021-10-12 ENCOUNTER — Inpatient Hospital Stay (HOSPITAL_COMMUNITY): Payer: Medicare Other

## 2021-10-12 DIAGNOSIS — M19172 Post-traumatic osteoarthritis, left ankle and foot: Secondary | ICD-10-CM | POA: Diagnosis not present

## 2021-10-12 HISTORY — PX: IR FLUORO GUIDE CV LINE RIGHT: IMG2283

## 2021-10-12 HISTORY — PX: IR US GUIDE VASC ACCESS RIGHT: IMG2390

## 2021-10-12 LAB — CBC
HCT: 35.7 % — ABNORMAL LOW (ref 36.0–46.0)
Hemoglobin: 11 g/dL — ABNORMAL LOW (ref 12.0–15.0)
MCH: 27.6 pg (ref 26.0–34.0)
MCHC: 30.8 g/dL (ref 30.0–36.0)
MCV: 89.5 fL (ref 80.0–100.0)
Platelets: 205 10*3/uL (ref 150–400)
RBC: 3.99 MIL/uL (ref 3.87–5.11)
RDW: 16.9 % — ABNORMAL HIGH (ref 11.5–15.5)
WBC: 7.8 10*3/uL (ref 4.0–10.5)
nRBC: 0 % (ref 0.0–0.2)

## 2021-10-12 LAB — GLUCOSE, CAPILLARY
Glucose-Capillary: 230 mg/dL — ABNORMAL HIGH (ref 70–99)
Glucose-Capillary: 313 mg/dL — ABNORMAL HIGH (ref 70–99)
Glucose-Capillary: 179 mg/dL — ABNORMAL HIGH (ref 70–99)
Glucose-Capillary: 229 mg/dL — ABNORMAL HIGH (ref 70–99)

## 2021-10-12 LAB — BASIC METABOLIC PANEL
Anion gap: 12 (ref 5–15)
BUN: 21 mg/dL (ref 8–23)
CO2: 23 mmol/L (ref 22–32)
Calcium: 8.1 mg/dL — ABNORMAL LOW (ref 8.9–10.3)
Chloride: 104 mmol/L (ref 98–111)
Creatinine, Ser: 1.11 mg/dL — ABNORMAL HIGH (ref 0.44–1.00)
GFR, Estimated: 48 mL/min — ABNORMAL LOW (ref >60)
Glucose, Bld: 249 mg/dL — ABNORMAL HIGH (ref 70–99)
Potassium: 4.4 mmol/L (ref 3.5–5.1)
Sodium: 139 mmol/L (ref 135–145)

## 2021-10-12 MED ORDER — LIDOCAINE HCL 1 % IJ SOLN
INTRAMUSCULAR | Status: AC
  Filled 2021-10-12: qty 20

## 2021-10-12 MED ORDER — LIDOCAINE HCL (PF) 1 % IJ SOLN
INTRAMUSCULAR | Status: DC | PRN
  Administered 2021-10-12: 10 mL

## 2021-10-12 MED ORDER — INSULIN GLARGINE-YFGN 100 UNIT/ML ~~LOC~~ SOLN
8.0000 [IU] | Freq: Every day | SUBCUTANEOUS | Status: DC
  Administered 2021-10-12 – 2021-10-15 (×4): 8 [IU] via SUBCUTANEOUS
  Filled 2021-10-12 (×5): qty 0.08

## 2021-10-12 NOTE — Progress Notes (Signed)
PHARMACY CONSULT NOTE FOR:  OUTPATIENT  PARENTERAL ANTIBIOTIC THERAPY (OPAT)  Indication: Ankle PJI Regimen: Daptomycin 500 mg IV every 24 hours and Rocephin 2g IV every 24 hours End date: 11/21/21  IV antibiotic discharge orders are pended. To discharging provider:  please sign these orders via discharge navigator,  Select New Orders & click on the button choice - Manage This Unsigned Work.     Thank you for allowing pharmacy to be a part of this patients care.  [name], PharmD, BCPS Clinical Pharmacist 10/12/2021 1:58 PM   **Pharmacist phone directory can now be found on [email or web address] ([email or web address]).  Listed under [place].

## 2021-10-12 NOTE — Procedures (Signed)
Interventional Radiology Procedure Note  Procedure: Placement of a right IJ approach DL tunneled cuffed CVC.  Tip is positioned at the superior cavoatrial junction and catheter is ready for immediate use.  Complications: None Recommendations:  - Ok to use - Do not submerge - Routine line care   Signed,  Dulcy Fanny. Earleen Newport, DO
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
## 2021-10-12 NOTE — Progress Notes (Signed)
[place] for Infectious Disease  Date of Admission:  10/10/2021     Total days of antibiotics 3         ASSESSMENT:  Ms. Iacovelli is POD #2 with surgical specimens without growth to date. Tunneled central line placement planned for today for home antibiotics. Will continue with broad spectrum coverage while monitoring cultures and narrowing antibiotics as able. OPAT orders below with plans for 6 weeks of therapy with Daptomycin and ceftriaxone. Continue post surgical care per Dr. Doreatha Martin. Will arrange follow up in ID office. [place] for discharge from ID standpoint once central line placed. ID will follow peripherally.  Remaining medical and supportive care per primary team.   PLAN:  Continue current dose of daptomycin and ceftriaxone.  OPAT orders below.  Central line placement per IR. Monitor cultures for organism identification and narrow antibiotics as appropriate.  Post-op surgical care per Dr. Doreatha Martin. Remaining medical and supportive care per primary team. Follow up in ID office.   Diagnosis: Osteomyelitis s/p hardware removal  Culture Result: No growth  No Known Allergies  OPAT Orders Discharge antibiotics to be given via PICC line Discharge antibiotics: Daptomycin and ceftriaxone Per pharmacy protocol   Duration: 6 weeks  End Date: 11/21/21   [facility] Care Per Protocol:  Home health RN for IV administration and teaching; PICC line care and labs.    Labs weekly while on IV antibiotics: _X_ CBC with differential _X_ BMP ___ CMP _X_ CRP _X_ ESR __ Vancomycin trough _X_ CK  __ Please pull PIC at completion of IV antibiotics __ Please leave PIC in place until doctor has seen patient or been notified  Fax weekly labs to [phone]  Clinic Follow Up Appt:  3:45 pm on 11/02/21 with Dr. Linus Salmons   Principal Problem:   Post-traumatic arthritis of left ankle    amitriptyline  10 mg Oral QHS   aspirin  325 mg Oral Daily   clopidogrel  75 mg Oral Daily    docusate sodium  100 mg Oral BID   insulin aspart  0-15 Units Subcutaneous TID WC   insulin aspart  0-5 Units Subcutaneous QHS   levothyroxine  125 mcg Oral Q0600   magnesium oxide  400 mg Oral BID   metoprolol succinate  12.5 mg Oral Daily   multivitamin with minerals  1 tablet Oral Daily   pantoprazole  40 mg Oral Daily   PARoxetine  10 mg Oral Daily   QUEtiapine  25 mg Oral QHS   rosuvastatin  10 mg Oral Daily   cyanocobalamin  1,000 mcg Oral Daily    SUBJECTIVE:  Afebrile overnight with no acute events. Pain adequately managed with Tylenol. Wondering what type of infection she has.   No Known Allergies   Review of Systems: Review of Systems  Constitutional:  Negative for chills, fever and weight loss.  Respiratory:  Negative for cough, shortness of breath and wheezing.   Cardiovascular:  Negative for chest pain and leg swelling.  Gastrointestinal:  Negative for abdominal pain, constipation, diarrhea, nausea and vomiting.  Skin:  Negative for rash.     OBJECTIVE: Vitals:   10/11/21 1654 10/11/21 2157 10/12/21 0500 10/12/21 0735  BP: (!) 109/48 (!) 156/45 103/74 136/74  Pulse: 71 80 89 78  Resp:  _0 Temp: 97.7 F (36.5 C) 98 F (36.7 C)  98 F (36.7 C)  TempSrc: Oral Oral  Oral  SpO2: 94% 94% 95% 92%  Weight:  Height:       Body mass index is 31.45 kg/m.  Physical Exam Constitutional:      General: She is not in acute distress.    Appearance: She is well-developed.  Cardiovascular:     Rate and Rhythm: Normal rate and regular rhythm.     Heart sounds: Normal heart sounds.  Pulmonary:     Effort: Pulmonary effort is normal.     Breath sounds: Normal breath sounds.  Musculoskeletal:     Comments: Surgical dressing in place and clean/dry.   Skin:    General: Skin is warm and dry.  Neurological:     Mental Status: She is alert and oriented to person, place, and time.  Psychiatric:        Behavior: Behavior normal.        Thought Content:  Thought content normal.        Judgment: Judgment normal.    Lab Results Lab Results  Component Value Date   WBC 7.8 10/12/2021   HGB 11.0 (L) 10/12/2021   HCT 35.7 (L) 10/12/2021   MCV 89.5 10/12/2021   PLT 205 10/12/2021    Lab Results  Component Value Date   CREATININE 1.11 (H) 10/12/2021   BUN 21 10/12/2021   NA 139 10/12/2021   K 4.4 10/12/2021   CL 104 10/12/2021   CO2 23 10/12/2021    Lab Results  Component Value Date   ALT 9 03/19/2021   AST 17 03/19/2021   ALKPHOS 87 03/19/2021   BILITOT 0.7 03/19/2021     Microbiology: Recent Results (from the past 240 hour(s))  Surgical PCR Screen     Status: None   Collection Time: 10/08/21  3:43 PM   Specimen: Nasal Mucosa; Nasal Swab  Result Value Ref Range Status   MRSA, PCR NEGATIVE NEGATIVE Final   Staphylococcus aureus NEGATIVE NEGATIVE Final    Comment: (NOTE) The Xpert SA Assay (FDA approved for NASAL specimens in patients 71 years of age and older), is one component of a comprehensive surveillance program. It is not intended to diagnose infection nor to guide or monitor treatment. Performed at [facility], [place] [address], [place], [place] [address]   Aerobic/Anaerobic Culture w Gram Stain (surgical/deep wound)     Status: None (Preliminary result)   Collection Time: 10/10/21  9:08 AM   Specimen: PATH Soft tissue  Result Value Ref Range Status   Specimen Description TISSUE  Final   Special Requests 1 LEFT ANLKE TISSUE SPEC A  Final   Gram Stain   Final    RARE WBC PRESENT,BOTH PMN AND MONONUCLEAR NO ORGANISMS SEEN    Culture   Final    NO GROWTH < 24 HOURS Performed at [facility], [place] [address] [place], [place] [address]    Report Status PENDING  Incomplete  Aerobic/Anaerobic Culture w Gram Stain (surgical/deep wound)     Status: None (Preliminary result)   Collection Time: 10/10/21  9:12 AM   Specimen: PATH Soft tissue  Result Value Ref Range Status   Specimen Description TISSUE   Final   Special Requests 2 LEFT ANLE TISSUE SPEC B  Final   Gram Stain   Final    FEW WBC PRESENT,BOTH PMN AND MONONUCLEAR NO ORGANISMS SEEN    Culture   Final    NO GROWTH < 24 HOURS Performed at [facility], [place] [address] [place], [place] [address]    Report Status PENDING  Incomplete  Aerobic/Anaerobic Culture w Gram Stain (surgical/deep wound)     Status: None (Preliminary result)   Collection Time: 10/10/21  9:14 AM   Specimen: PATH Soft tissue  Result Value Ref Range Status   Specimen Description TISSUE  Final   Special Requests 3 LEFT ANKLE TISSUE SPEC C  Final   Gram Stain NO WBC SEEN NO ORGANISMS SEEN   Final   Culture   Final    NO GROWTH < 24 HOURS Performed at [facility], [place] [address], [place], [place] [address]    Report Status PENDING  Incomplete     Terri Piedra, [facility] for Infectious [facility]  10/12/2021  10:45 AM

## 2021-10-12 NOTE — Progress Notes (Signed)
Inpatient Diabetes Program Recommendations  AACE/ADA: New Consensus Statement on Inpatient Glycemic Control (2015)  Target Ranges:  Prepandial:   less than 140 mg/dL      Peak postprandial:   less than 180 mg/dL (1-2 hours)      Critically ill patients:  140 - 180 mg/dL   Lab Results  Component Value Date   GLUCAP 313 (H) 10/12/2021   HGBA1C 8.0 (H) 10/10/2021    Review of Glycemic Control  Latest Reference Range & Units 10/11/21 19:53 10/12/21 06:44 10/12/21 11:14  Glucose-Capillary 70 - 99 mg/dL 163 (H) 230 (H) 313 (H)  (H): Data is abnormally high Diabetes history: Type 2 DM Outpatient Diabetes medications: Lantus 12 units QHS Current orders for Inpatient glycemic control: Novolog 0-15 units TID & HS  Inpatient Diabetes Program Recommendations:    Consider adding Semglee 8 units QD and changing diet to carb modified diet.  Secure chat sent to PA.   Thanks, Bronson Curb, MSN, RNC-OB Diabetes Coordinator [phone] (8a-5p)

## 2021-10-12 NOTE — Progress Notes (Signed)
Orthopaedic Trauma Progress Note  SUBJECTIVE: Reports  minimal  to no pain about operative site. Has only required Tylenol for pain. No specific complaints currently. Scheduled for PICC line placement today. Cultures with no growth to date.   OBJECTIVE:  Vitals:   10/12/21 0500 10/12/21 0735  BP: 103/74 136/74  Pulse: 89 78  Resp: 18 17  Temp:  98 F (36.7 C)  SpO2: 95% 92%    General: Sitting up in bed, NAD Respiratory: No increased work of breathing.  LLE: Dressing removed, mild bloody drainage from lateral incision. New dressing applied. Able to wiggle toes. Tolerate gentle ankle motion without increase in pain. Foot warm and well perfused. +DP pulse  IMAGING: Stable post op imaging.   LABS:  Results for orders placed or performed during the hospital encounter of 10/10/21 (from the past 24 hour(s))  Glucose, capillary     Status: Abnormal   Collection Time: 10/11/21 11:40 AM  Result Value Ref Range   Glucose-Capillary 188 (H) 70 - 99 mg/dL  Glucose, capillary     Status: Abnormal   Collection Time: 10/11/21  4:17 PM  Result Value Ref Range   Glucose-Capillary 195 (H) 70 - 99 mg/dL  Glucose, capillary     Status: Abnormal   Collection Time: 10/11/21  7:53 PM  Result Value Ref Range   Glucose-Capillary 163 (H) 70 - 99 mg/dL  Basic metabolic panel     Status: Abnormal   Collection Time: 10/12/21  3:27 AM  Result Value Ref Range   Sodium 139 135 - 145 mmol/L   Potassium 4.4 3.5 - 5.1 mmol/L   Chloride 104 98 - 111 mmol/L   CO2 23 22 - 32 mmol/L   Glucose, Bld 249 (H) 70 - 99 mg/dL   BUN 21 8 - 23 mg/dL   Creatinine, Ser 1.11 (H) 0.44 - 1.00 mg/dL   Calcium 8.1 (L) 8.9 - 10.3 mg/dL   GFR, Estimated 48 (L) >60 mL/min   Anion gap 12 5 - 15  CBC     Status: Abnormal   Collection Time: 10/12/21  3:27 AM  Result Value Ref Range   WBC 7.8 4.0 - 10.5 K/uL   RBC 3.99 3.87 - 5.11 MIL/uL   Hemoglobin 11.0 (L) 12.0 - 15.0 g/dL   HCT 35.7 (L) 36.0 - 46.0 %   MCV 89.5 80.0 -  100.0 fL   MCH 27.6 26.0 - 34.0 pg   MCHC 30.8 30.0 - 36.0 g/dL   RDW 16.9 (H) 11.5 - 15.5 %   Platelets 205 150 - 400 K/uL   nRBC 0.0 0.0 - 0.2 %  Glucose, capillary     Status: Abnormal   Collection Time: 10/12/21  6:44 AM  Result Value Ref Range   Glucose-Capillary 230 (H) 70 - 99 mg/dL    ASSESSMENT: Cassandra [name] is a 88 y.o. female, 2 Days Post-Op s/p IRRIGATION & DEBRIDEMENT LEFT ANKL WITH HARDWARE REMOVAL   CV/Blood loss: Acute blood loss anemia, Hgb 11.0 this morning. Hemodynamically stable  PLAN: Weightbearing: WBAT LLE ROM: Ok for unrestricted range of motion Incisional and dressing care: Changed today. Continue to change PRN Showering: Ok to shower, keep dressing dry Orthopedic device(s): None  Pain management:  1. Tylenol 325-650 mg q 6 hours PRN 2. Norco 5-325 mg  mg q 4 hours PRN VTE prophylaxis: Aspirin and Plavix , SCDs ID: Ceftriaxone + Daptomycin Foley/Lines: No foley, KVO IVFs Impediments to Fracture Healing: Vit D level 29, continue D3  supplementation Dispo: Therapies as tolerated, PT/OT recommending SNF. TOC following for bed placement. Awaiting cultures. Appreciate assistance from ID. Patient will require ankle fusion on delayed basis, likely 6-8 weeks  D/C recommendations: - Hydrocodone for pain control - Aspirin for DVT prophylaxis - Continue Vit D supplementation  Follow - up plan: 2 weeks   Contact information:  [name] MD, Rushie Nyhan PA-C. After hours and holidays please check [email or web address] for group call information for [facility]   Gwinda Passe, PA-C [phone] (office) [email or web address]

## 2021-10-13 LAB — BASIC METABOLIC PANEL
Anion gap: 9 (ref 5–15)
BUN: 19 mg/dL (ref 8–23)
CO2: 26 mmol/L (ref 22–32)
Calcium: 8.5 mg/dL — ABNORMAL LOW (ref 8.9–10.3)
Chloride: 104 mmol/L (ref 98–111)
Creatinine, Ser: 0.99 mg/dL (ref 0.44–1.00)
GFR, Estimated: 55 mL/min — ABNORMAL LOW (ref >60)
Glucose, Bld: 271 mg/dL — ABNORMAL HIGH (ref 70–99)
Potassium: 3.9 mmol/L (ref 3.5–5.1)
Sodium: 139 mmol/L (ref 135–145)

## 2021-10-13 LAB — GLUCOSE, CAPILLARY
Glucose-Capillary: 209 mg/dL — ABNORMAL HIGH (ref 70–99)
Glucose-Capillary: 278 mg/dL — ABNORMAL HIGH (ref 70–99)
Glucose-Capillary: 192 mg/dL — ABNORMAL HIGH (ref 70–99)
Glucose-Capillary: 124 mg/dL — ABNORMAL HIGH (ref 70–99)
Glucose-Capillary: 140 mg/dL — ABNORMAL HIGH (ref 70–99)

## 2021-10-13 NOTE — Plan of Care (Signed)
°  Problem: Clinical Measurements: Goal: Will remain free from infection Outcome: Progressing  Pt instructed on abt and wound remains shows no s/s of infection.
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
## 2021-10-14 LAB — GLUCOSE, CAPILLARY
Glucose-Capillary: 362 mg/dL — ABNORMAL HIGH (ref 70–99)
Glucose-Capillary: 222 mg/dL — ABNORMAL HIGH (ref 70–99)
Glucose-Capillary: 85 mg/dL (ref 70–99)
Glucose-Capillary: 201 mg/dL — ABNORMAL HIGH (ref 70–99)
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
## 2021-10-14 NOTE — Progress Notes (Signed)
Pharmacy Antibiotic Note- follow-up  Cassandra [name] is a 88 y.o. female admitted on 10/10/2021 with R-ankle PJI s/p I&D and removal of hardware with cultures pending. Pharmacy was consulted to start Daptomycin + Ceftriaxone on 10/10/2021  2/11- SCr down to 0.99, CrCl~30-35 ml/min.   Plan: - Daptomycin to 500 mg (~7 mg/kg) every 24 hours - Rocephin 2g IV every 24 hours - Pharmacist continues to follow renal function, culture results, CK - LOT through 11/21/2021. OPAT received and discharge orders entered/pending  Height: 4\' 11"  (149.9 cm) Weight: 70.6 kg (155 lb 11.2 oz) IBW/kg (Calculated) : 43.2  Temp (24hrs), Avg:97.9 F (36.6 C), Min:97.7 F (36.5 C), Max:98.2 F (36.8 C)  Recent Labs  Lab 10/08/21 1543 10/10/21 1318 10/11/21 0307 10/12/21 0327 10/13/21 0115  WBC 9.4 8.6 7.6 7.8  --   CREATININE 1.18*  --  1.04* 1.11* 0.99     Estimated Creatinine Clearance: 34.3 mL/min (by C-G formula based on SCr of 0.99 mg/dL).    No Known Allergies  Antimicrobials this admission: Daptomycin 2/8 >> [3/22] Ceftriaxone 2/8 >> [3/22]  Dose adjustments this admission: N/a  Microbiology results: 2/8 OR intra-op cx >> no growth at 3 days  Thank you for allowing pharmacy to be a part of this patients care.  Vaughan Basta BS, PharmD, BCPS Clinical Pharmacist 10/14/2021 8:17 AM   **Pharmacist phone directory can now be found on [email or web address] ([email or web address]).  Listed under [place].

## 2021-10-14 NOTE — Plan of Care (Signed)

## 2021-10-15 DIAGNOSIS — T847XXA Infection and inflammatory reaction due to other internal orthopedic prosthetic devices, implants and grafts, initial encounter: Secondary | ICD-10-CM

## 2021-10-15 DIAGNOSIS — M869 Osteomyelitis, unspecified: Secondary | ICD-10-CM | POA: Diagnosis not present

## 2021-10-15 DIAGNOSIS — T847XXD Infection and inflammatory reaction due to other internal orthopedic prosthetic devices, implants and grafts, subsequent encounter: Secondary | ICD-10-CM

## 2021-10-15 LAB — AEROBIC/ANAEROBIC CULTURE W GRAM STAIN (SURGICAL/DEEP WOUND)
Culture: NO GROWTH
Culture: NO GROWTH
Culture: NO GROWTH
Gram Stain: NONE SEEN
Special Requests: 1
Special Requests: 2
Special Requests: 3

## 2021-10-15 LAB — GLUCOSE, CAPILLARY
Glucose-Capillary: 203 mg/dL — ABNORMAL HIGH (ref 70–99)
Glucose-Capillary: 188 mg/dL — ABNORMAL HIGH (ref 70–99)
Glucose-Capillary: 155 mg/dL — ABNORMAL HIGH (ref 70–99)
Glucose-Capillary: 172 mg/dL — ABNORMAL HIGH (ref 70–99)

## 2021-10-15 MED ORDER — METFORMIN HCL 500 MG PO TABS
1000.0000 mg | ORAL_TABLET | Freq: Two times a day (BID) | ORAL | Status: DC
  Administered 2021-10-15 – 2021-10-16 (×2): 1000 mg via ORAL
  Filled 2021-10-15 (×2): qty 2

## 2021-10-15 MED ORDER — DAPTOMYCIN IV (FOR PTA / DISCHARGE USE ONLY): 500.0000 mg | INTRAVENOUS | 0 refills | Status: AC

## 2021-10-15 MED ORDER — CEFTRIAXONE IV (FOR PTA / DISCHARGE USE ONLY): 2.0000 g | INTRAVENOUS | 0 refills | Status: AC

## 2021-10-15 MED ORDER — PIOGLITAZONE HCL 30 MG PO TABS
30.0000 mg | ORAL_TABLET | Freq: Every day | ORAL | Status: DC
  Administered 2021-10-15 – 2021-10-16 (×2): 30 mg via ORAL
  Filled 2021-10-15 (×2): qty 1

## 2021-10-15 NOTE — Progress Notes (Signed)
Physical Therapy Treatment Patient Details Name: Cassandra [name] MRN: [id number] DOB: [birth year] Today's Date: 10/15/2021   History of Present Illness 88 y.o. female [facility] hospital on /04/2022 for removal of L ankle hardware along with I&D 2/2 post-op infection. PMH includes DMII, HTN, HLD, breast cancer, COPD, Oxygen use at home PRN at night, OSA, CAD, PVD, gout.    PT Comments    Pt received in recliner, agreeable to second session for review of HEP, car transfer training and transfer safety with use of gait belt for caregiver assist. Per case mgmt, pt may not be able to get HHPT so therefore pt instructed on safe HEP technique for mobility progression. Pt receptive to all instruction, given handout in Spanish and [place] as per pt one of her caregivers only speaks Spanish (link: [email or web address] Access Code: [email or web address]). Reviewed car transfers and pt reports confidence with technique with spouse assist (spouse not present in room during session). Pt continues to benefit from PT services to progress toward functional mobility goals.   Recommendations for follow up therapy are one component of a multi-disciplinary discharge planning process, led by the attending physician.  Recommendations may be updated based on patient status, additional functional criteria and insurance authorization.  Follow Up Recommendations  Home health PT (vs OPPT if HHPT unavailable)     Assistance Recommended at Discharge Intermittent Supervision/Assistance  Patient can return home with the following A little help with walking and/or transfers;A little help with bathing/dressing/bathroom;Assistance with cooking/housework;Help with stairs or ramp for entrance   Equipment Recommendations  None recommended by PT (pt owns needed DME)    Recommendations for Other Services       Precautions / Restrictions Precautions Precautions: Fall Restrictions Weight Bearing Restrictions: Yes LLE Weight  Bearing: Weight bearing as tolerated     Mobility  Bed Mobility               General bed mobility comments: received in recliner    Transfers Overall transfer level: Needs assistance Equipment used: Rolling walker (2 wheels) Transfers: Sit to/from Stand Sit to Stand: Supervision           General transfer comment: seated session for HEP instruction only, defer transfers per pt fatigue after previous session and emphasis on instruction this session    Ambulation/Gait Ambulation/Gait assistance: Supervision Gait Distance (Feet): 150 Feet Assistive device: Rolling walker (2 wheels) Gait Pattern/deviations: Step-through pattern Gait velocity: reduced Gait velocity interpretation: <1.8 ft/sec, indicate of risk for recurrent falls   General Gait Details: pt tending to hold RW outside BOS when distracted, needing cues every 2-3 minutes for proper use of RW and noted to be limiting LLE WB due to pain, tending to put weight through only forefoot although able to place weight through heel a few steps when encouraged, pain limiting.   Stairs             Wheelchair Mobility    Modified Rankin (Stroke Patients Only)       Balance Overall balance assessment: Needs assistance Sitting-balance support: No upper extremity supported, Feet supported Sitting balance-Leahy Scale: Normal     Standing balance support: No upper extremity supported, Reliant on assistive device for balance Standing balance-Leahy Scale: Fair (using RW) Standing balance comment: no LOB using RW for mobility                            Cognition Arousal/Alertness: Awake/alert Behavior During Therapy: WFL for tasks  assessed/performed Overall Cognitive Status: Within Functional Limits for tasks assessed                                 General Comments: pt receptive to [employer] instruction        Exercises Other Exercises Other Exercises: seated BLE AROM: hip  flexion, LAQ, ankle pumps x10 reps Other Exercises: verbal/visual demo for supine SLR and standing hip flexion, abduction, mini squats and hamstring curls Other Exercises: HEP Link: [email or web address]    Cdigo de acceso: [email or web address]    General Comments General comments (skin integrity, edema, etc.): discussion on HEP, car transfer training, gait belt use/adjustment, energy conservation      Pertinent Vitals/Pain Pain Assessment Pain Assessment: Faces Pain Score: 5  Faces Pain Scale: Hurts a little bit Pain Location: LLE and R shoulder from IV site Pain Descriptors / Indicators: Sore, Discomfort Pain Intervention(s): Limited activity within patient's tolerance, Monitored during session, Premedicated before session     PT Goals (current goals can now be found in the care plan section) Acute Rehab PT Goals Patient Stated Goal: to regain the ability to ambulate for household and community distances PT Goal Formulation: With patient Time For Goal Achievement: 10/24/21 Progress towards PT goals: Progressing toward goals    Frequency    Min 3X/week      PT Plan Current plan remains appropriate       AM-PAC PT "6 Clicks" Mobility   Outcome Measure  Help needed turning from your back to your side while in a flat bed without using bedrails?: None Help needed moving from lying on your back to sitting on the side of a flat bed without using bedrails?: None Help needed moving to and from a bed to a chair (including a wheelchair)?: A Little Help needed standing up from a chair using your arms (e.g., wheelchair or bedside chair)?: A Little Help needed to walk in hospital room?: A Little (increased cues for RW use) Help needed climbing 3-5 steps with a railing? : A Lot 6 Click Score: 19    End of Session Equipment Utilized During Treatment: Gait belt (pt given her own gait belt to take home) Activity Tolerance: Patient tolerated treatment well Patient left: with call  bell/phone within reach;in chair;with chair alarm set Nurse Communication: Mobility status PT Visit Diagnosis: Other abnormalities of gait and mobility (R26.89);Muscle weakness (generalized) (M62.81)     Time: 9357-0177 PT Time Calculation (min) (ACUTE ONLY): 14 min  Charges:   $Therapeutic Exercise: 8-22 mins                     Mostafa Yuan P., PTA Acute Rehabilitation Services Pager: [phone] Office: [place] 10/15/2021, 12:04 PM

## 2021-10-15 NOTE — Progress Notes (Signed)
[facility] for Infectious Disease  Date of Admission:  10/10/2021     Total days of antibiotics 6         ASSESSMENT:  Ms. Seckman's surgical specimens remain without growth to date. Anticipate will need to proceed with broad spectrum coverage for the duration of 6 weeks of treatment. Disposition remains pending with possibility of CIR vs home. Most recent CK level of 47. Continue current dose of daptomycin and ceftriaxone. End date tentatively set for 11/21/21. OPAT orders pending final disposition determination.   PLAN:  Continue current dose of daptomycin and ceftriaxone.  Monitor CK levels while on Daptomycin.  Post-surgical care per Dr. Doreatha Martin. Await disposition for OPAT orders.    Principal Problem:   Post-traumatic arthritis of left ankle Active Problems:   Hardware complicating wound infection (HCC)    amitriptyline  10 mg Oral QHS   aspirin  325 mg Oral Daily   clopidogrel  75 mg Oral Daily   docusate sodium  100 mg Oral BID   insulin aspart  0-15 Units Subcutaneous TID WC   insulin aspart  0-5 Units Subcutaneous QHS   insulin glargine-yfgn  8 Units Subcutaneous QHS   levothyroxine  125 mcg Oral Q0600   magnesium oxide  400 mg Oral BID   metFORMIN  1,000 mg Oral BID WC   metoprolol succinate  12.5 mg Oral Daily   multivitamin with minerals  1 tablet Oral Daily   pantoprazole  40 mg Oral Daily   PARoxetine  10 mg Oral Daily   pioglitazone  30 mg Oral Daily   QUEtiapine  25 mg Oral QHS   rosuvastatin  10 mg Oral Daily   cyanocobalamin  1,000 mcg Oral Daily    SUBJECTIVE:  Afebrile overnight with no acute events. Has questions about disposition. No new concerns/complaints.   No Known Allergies   Review of Systems: Review of Systems  Constitutional:  Negative for chills, fever and weight loss.  Respiratory:  Negative for cough, shortness of breath and wheezing.   Cardiovascular:  Negative for chest pain and leg swelling.  Gastrointestinal:   Negative for abdominal pain, constipation, diarrhea, nausea and vomiting.  Skin:  Negative for rash.     OBJECTIVE: Vitals:   10/14/21 1603 10/14/21 2012 10/15/21 0434 10/15/21 0758  BP: (!) 144/53 (!) 128/57 113/86 125/72  Pulse: 68 67 83 72  Resp: 17 17  19   Temp: (!) 97.4 F (36.3 C) 97.9 F (36.6 C) 97.6 F (36.4 C) 98.2 F (36.8 C)  TempSrc: Oral Oral Oral   SpO2: 90% 90% 94% 91%  Weight:      Height:       Body mass index is 31.45 kg/m.  Physical Exam Constitutional:      General: She is not in acute distress.    Appearance: She is well-developed.  Cardiovascular:     Rate and Rhythm: Normal rate and regular rhythm.     Heart sounds: Normal heart sounds.  Pulmonary:     Effort: Pulmonary effort is normal.     Breath sounds: Normal breath sounds.  Musculoskeletal:     Comments: Surgical dressing is clean, dry and intact.   Skin:    General: Skin is warm and dry.  Neurological:     Mental Status: She is alert and oriented to person, place, and time.  Psychiatric:        Behavior: Behavior normal.        Thought Content: Thought content normal.  Judgment: Judgment normal.    Lab Results Lab Results  Component Value Date   WBC 7.8 10/12/2021   HGB 11.0 (L) 10/12/2021   HCT 35.7 (L) 10/12/2021   MCV 89.5 10/12/2021   PLT 205 10/12/2021    Lab Results  Component Value Date   CREATININE 0.99 10/13/2021   BUN 19 10/13/2021   NA 139 10/13/2021   K 3.9 10/13/2021   CL 104 10/13/2021   CO2 26 10/13/2021    Lab Results  Component Value Date   ALT 9 03/19/2021   AST 17 03/19/2021   ALKPHOS 87 03/19/2021   BILITOT 0.7 03/19/2021     Microbiology: Recent Results (from the past 240 hour(s))  Surgical PCR Screen     Status: None   Collection Time: 10/08/21  3:43 PM   Specimen: Nasal Mucosa; Nasal Swab  Result Value Ref Range Status   MRSA, PCR NEGATIVE NEGATIVE Final   Staphylococcus aureus NEGATIVE NEGATIVE Final    Comment: (NOTE) The  Xpert SA Assay (FDA approved for NASAL specimens in patients 28 years of age and older), is one component of a comprehensive surveillance program. It is not intended to diagnose infection nor to guide or monitor treatment. Performed at [facility], [place] [address] [place], [place] [address]   Aerobic/Anaerobic Culture w Gram Stain (surgical/deep wound)     Status: None   Collection Time: 10/10/21  9:08 AM   Specimen: PATH Soft tissue  Result Value Ref Range Status   Specimen Description TISSUE  Final   Special Requests 1 LEFT ANLKE TISSUE SPEC A  Final   Gram Stain   Final    RARE WBC PRESENT,BOTH PMN AND MONONUCLEAR NO ORGANISMS SEEN    Culture   Final    No growth aerobically or anaerobically. Performed at [facility], [place] [address], [place], [place] [address]    Report Status 10/15/2021 FINAL  Final  Aerobic/Anaerobic Culture w Gram Stain (surgical/deep wound)     Status: None   Collection Time: 10/10/21  9:12 AM   Specimen: PATH Soft tissue  Result Value Ref Range Status   Specimen Description TISSUE  Final   Special Requests 2 LEFT ANLE TISSUE SPEC B  Final   Gram Stain   Final    FEW WBC PRESENT,BOTH PMN AND MONONUCLEAR NO ORGANISMS SEEN    Culture   Final    No growth aerobically or anaerobically. Performed at [facility], [place] [address], [place], [place] [address]    Report Status 10/15/2021 FINAL  Final  Aerobic/Anaerobic Culture w Gram Stain (surgical/deep wound)     Status: None   Collection Time: 10/10/21  9:14 AM   Specimen: PATH Soft tissue  Result Value Ref Range Status   Specimen Description TISSUE  Final   Special Requests 3 LEFT ANKLE TISSUE SPEC C  Final   Gram Stain NO WBC SEEN NO ORGANISMS SEEN   Final   Culture   Final    No growth aerobically or anaerobically. Performed at [facility], [place] [address] [place], [place] [address]    Report Status 10/15/2021 FINAL  Final     Terri Piedra, NP [facility]  10/15/2021  11:41 AM

## 2021-10-15 NOTE — Care Management Important Message (Signed)
Important Message  Patient Details  Name: Cassandra [name] MRN: [id number] Date of Birth: 02/04/1933   Medicare Important Message Given:  Yes     Hannah Beat 10/15/2021, 12:04 PM

## 2021-10-15 NOTE — Anesthesia Postprocedure Evaluation (Signed)
Anesthesia Post Note  Patient: Cassandra [name]  Procedure(s) Performed: ANKLE FUSION (Left: Ankle)     Patient location during evaluation: PACU Anesthesia Type: MAC and Regional Level of consciousness: awake and alert Pain management: pain level controlled Vital Signs Assessment: post-procedure vital signs reviewed and stable Respiratory status: spontaneous breathing, nonlabored ventilation, respiratory function stable and patient connected to nasal cannula oxygen Cardiovascular status: stable and blood pressure returned to baseline Postop Assessment: no apparent nausea or vomiting Anesthetic complications: no   No notable events documented.  Last Vitals:  Vitals:   10/15/21 0434 10/15/21 0758  BP: 113/86 125/72  Pulse: 83 72  Resp:  19  Temp: 36.4 C 36.8 C  SpO2: 94% 91%    Last Pain:  Vitals:   10/15/21 0434  TempSrc: Oral  PainSc:                  Kordel Leavy
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
## 2021-10-15 NOTE — Progress Notes (Signed)
Physical Therapy Treatment Patient Details Name: Cassandra [name] MRN: [id number] DOB: June 08, 1933 Today's Date: 10/15/2021   History of Present Illness 88 y.o. female presents to [facility] hospital on /04/2022 for removal of L ankle hardware along with I&D 2/2 post-op infection. PMH includes DMII, HTN, HLD, breast cancer, COPD, Oxygen use at home PRN at night, OSA, CAD, PVD, gout.    PT Comments    Pt received seated EOB, pt son and his spouse present in room but on their way out, pt OK with hospital staff notifying them with plan for disposition, case mgmt notified. Pt with good participation and tolerance for gait and transfer training and able to progress gait distance with no dyspnea today. Pt reports moderate L heel pain and needing min cues for safe use of RW and safety with transfers, but no physical assist for either today. Discussed disposition with pt/supervising PT Lynnell Jude. per pt progress and both agreeable to home, per pt she was able to arrange increased supervision/assist from spouse and a paid caregiver during the day and should be safe to DC home from a PT standpoint with supervision/assist available while she is mobilizing. Will continue to follow acutely to progress pt endurance/strength and safety for DC home.  Recommendations for follow up therapy are one component of a multi-disciplinary discharge planning process, led by the attending physician.  Recommendations may be updated based on patient status, additional functional criteria and insurance authorization.  Follow Up Recommendations  Home health PT (vs OPPT if HHPT unavailable)     Assistance Recommended at Discharge Intermittent Supervision/Assistance  Patient can return home with the following A little help with walking and/or transfers;A little help with bathing/dressing/bathroom;Assistance with cooking/housework;Help with stairs or ramp for entrance   Equipment Recommendations  None recommended by PT (pt owns needed DME)     Recommendations for Other Services       Precautions / Restrictions Precautions Precautions: Fall Restrictions Weight Bearing Restrictions: Yes LLE Weight Bearing: Weight bearing as tolerated     Mobility  Bed Mobility               General bed mobility comments: received seated EOB    Transfers Overall transfer level: Needs assistance Equipment used: Rolling walker (2 wheels) Transfers: Sit to/from Stand Sit to Stand: Supervision           General transfer comment: from EOB and recliner heights, min cues for safe hand placement with good recall after reciprocal transfer training.    Ambulation/Gait Ambulation/Gait assistance: Supervision Gait Distance (Feet): 150 Feet Assistive device: Rolling walker (2 wheels) Gait Pattern/deviations: Step-through pattern Gait velocity: reduced Gait velocity interpretation: <1.8 ft/sec, indicate of risk for recurrent falls   General Gait Details: pt tending to hold RW outside BOS when distracted, needing cues every 2-3 minutes for proper use of RW and noted to be limiting LLE WB due to pain, tending to put weight through only forefoot although able to place weight through heel a few steps when encouraged, pain limiting.   Stairs             Wheelchair Mobility    Modified Rankin (Stroke Patients Only)       Balance Overall balance assessment: Needs assistance Sitting-balance support: No upper extremity supported, Feet supported Sitting balance-Leahy Scale: Normal     Standing balance support: No upper extremity supported, Reliant on assistive device for balance Standing balance-Leahy Scale: Fair (using RW) Standing balance comment: no LOB using RW for mobility  Cognition Arousal/Alertness: Awake/alert Behavior During Therapy: WFL for tasks assessed/performed Overall Cognitive Status: Within Functional Limits for tasks assessed                                  General Comments: mild decreased recall of safe hand placement for transfers from previous session, pleasantly participatory; pt A&O but distracted/tangential at times. Pt now stating she will have consistent supervision/assist at home from spouse and a neighbor caretaker that she can pay.        Exercises      General Comments General comments (skin integrity, edema, etc.): encouraged activity pacing, frequent OOB mobility and HEP handout given to reinforce LE strengthening and transfer training      Pertinent Vitals/Pain Pain Assessment Pain Assessment: 0-10 Pain Score: 5  Pain Location: L heel/ankle with weightbearing, mild shoulder soreness from RW use Pain Descriptors / Indicators: Sore, Discomfort Pain Intervention(s): Limited activity within patient's tolerance, Monitored during session, Repositioned, Premedicated before session     PT Goals (current goals can now be found in the care plan section) Acute Rehab PT Goals Patient Stated Goal: to regain the ability to ambulate for household and community distances PT Goal Formulation: With patient Time For Goal Achievement: 10/24/21 Progress towards PT goals: Progressing toward goals    Frequency    Min 3X/week      PT Plan Frequency needs to be updated       AM-PAC PT "6 Clicks" Mobility   Outcome Measure  Help needed turning from your back to your side while in a flat bed without using bedrails?: None Help needed moving from lying on your back to sitting on the side of a flat bed without using bedrails?: None Help needed moving to and from a bed to a chair (including a wheelchair)?: A Little Help needed standing up from a chair using your arms (e.g., wheelchair or bedside chair)?: A Little Help needed to walk in hospital room?: A Little (increased cues for RW use) Help needed climbing 3-5 steps with a railing? : A Lot 6 Click Score: 19    End of Session Equipment Utilized During Treatment: Gait  belt Activity Tolerance: Patient tolerated treatment well Patient left: with call bell/phone within reach;Other (comment);with nursing/sitter in room (on toilet, NT in room, pt to pull call bell once done) Nurse Communication: Mobility status PT Visit Diagnosis: Other abnormalities of gait and mobility (R26.89);Muscle weakness (generalized) (M62.81)     Time: 6270-3500 PT Time Calculation (min) (ACUTE ONLY): 20 min  Charges:  $Gait Training: 8-22 mins                     Shyah Cadmus P., PTA Acute Rehabilitation Services Pager: [phone] Office: [place] 10/15/2021, 11:34 AM

## 2021-10-16 LAB — GLUCOSE, CAPILLARY
Glucose-Capillary: 188 mg/dL — ABNORMAL HIGH (ref 70–99)
Glucose-Capillary: 106 mg/dL — ABNORMAL HIGH (ref 70–99)

## 2021-10-16 MED ORDER — ASPIRIN 325 MG PO TABS: 325.0000 mg | ORAL_TABLET | Freq: Every day | ORAL | 0 refills | Status: AC

## 2021-10-16 MED ORDER — HYDROCODONE-ACETAMINOPHEN 5-325 MG PO TABS: 1.0000 | ORAL_TABLET | Freq: Four times a day (QID) | ORAL | 0 refills | Status: DC | PRN

## 2021-10-16 MED ORDER — DIPHENHYDRAMINE HCL 25 MG PO CAPS
25.0000 mg | ORAL_CAPSULE | Freq: Once | ORAL | Status: AC
  Administered 2021-10-16: 25 mg via ORAL
  Filled 2021-10-16: qty 1

## 2021-10-16 MED ORDER — HEPARIN SOD (PORK) LOCK FLUSH 100 UNIT/ML IV SOLN
250.0000 [IU] | INTRAVENOUS | Status: AC | PRN
  Administered 2021-10-16: 250 [IU]
  Filled 2021-10-16: qty 2.5

## 2021-10-16 NOTE — Progress Notes (Signed)
TC to MD office patient is having anxiety and concern of discharge and not able to go to sleep states her mind racing a mile a min. Arthor Captain LPN

## 2021-10-16 NOTE — Plan of Care (Signed)

## 2021-10-16 NOTE — Progress Notes (Signed)
Occupational Therapy Treatment Patient Details Name: Cassandra [name] MRN: [id number] DOB: [birth year] Today's Date: 10/16/2021   History of present illness 88 y.o. female presents to [facility] hospital on /04/2022 for removal of L ankle hardware along with I&D 2/2 post-op infection. PMH includes DMII, HTN, HLD, breast cancer, COPD, Oxygen use at home PRN at night, OSA, CAD, PVD, gout.   OT comments  Pt seen to address ADL/mobility concerns in prep for planned discharge today. Collaborated with pt on bathing strategies and tub/shower transfer mgmt in consideration for LLE bandaging with pt considering familiar strategies from previous [facility] admission. Pt reports having all DME needs met, husband present and endorses ability to provide support as needed. Pt appears more confident in abilities than on OT eval (when SNF was requested) and appropriate for DC home with [facility] therapies follow-up.   Recommendations for follow up therapy are one component of a multi-disciplinary discharge planning process, led by the attending physician.  Recommendations may be updated based on patient status, additional functional criteria and insurance authorization.    Follow Up Recommendations  Home health OT    Assistance Recommended at Discharge Intermittent Supervision/Assistance  Patient can return home with the following  A little help with walking and/or transfers;A little help with bathing/dressing/bathroom;Assistance with cooking/housework;Assist for transportation;Help with stairs or ramp for entrance   Equipment Recommendations  None recommended by OT    Recommendations for Other Services      Precautions / Restrictions Precautions Precautions: Fall Restrictions Weight Bearing Restrictions: Yes LLE Weight Bearing: Weight bearing as tolerated       Mobility Bed Mobility               General bed mobility comments: up in chair on entry    Transfers                         Balance  Overall balance assessment: Needs assistance Sitting-balance support: No upper extremity supported, Feet supported Sitting balance-Leahy Scale: Normal                                     ADL either performed or assessed with clinical judgement   ADL Overall ADL's : Needs assistance/impaired                                       General ADL Comments: Problem solved bathing strategies with L LE bandaging as pt values showering tasks over sponge bathing. Demonstrated use of shower chair and placing L LE outof tub while supported on tub edge vs use of tub bench (pt has this DME but says it does not fit right in tub) vs bags over LE (pt famiiar with this from AIR). Educated pt to double bag for extra security, secure below knee joint to maintain integrity of bag    Extremity/Trunk Assessment Upper Extremity Assessment Upper Extremity Assessment: Overall WFL for tasks assessed   Lower Extremity Assessment Lower Extremity Assessment: Defer to PT evaluation        Vision   Vision Assessment?: No apparent visual deficits   Perception     Praxis      Cognition Arousal/Alertness: Awake/alert Behavior During Therapy: WFL for tasks assessed/performed Overall Cognitive Status: Within Functional Limits for tasks assessed  General Comments: able to demo insight into compensatory strategies for showering tasks, able to verbally describe how she handled stair mgmt with last admission, etc.        Exercises      Shoulder Instructions       General Comments Husband at bedside, dressed and ready for discharge. Pt reports she enjoyed HH therapies and hopeful to have that again.    Pertinent Vitals/ Pain       Pain Assessment Pain Assessment: Faces Faces Pain Scale: Hurts a little bit Pain Location: LLE Pain Descriptors / Indicators: Sore Pain Intervention(s): Monitored during session  Home Living                                           Prior Functioning/Environment              Frequency  Min 2X/week        Progress Toward Goals  OT Goals(current goals can now be found in the care plan section)  Progress towards OT goals: Progressing toward goals  Acute Rehab OT Goals OT Goal Formulation: With patient Time For Goal Achievement: 10/25/21 Potential to Achieve Goals: Good ADL Goals Pt Will Perform Grooming: with modified independence;standing Pt Will Perform Lower Body Bathing: with modified independence;sit to/from stand Pt Will Perform Lower Body Dressing: with modified independence;sit to/from stand Pt Will Transfer to Toilet: with modified independence;ambulating;bedside commode Pt Will Perform Toileting - Clothing Manipulation and hygiene: with modified independence;sit to/from stand  Plan Discharge plan needs to be updated    Co-evaluation                 AM-PAC OT "6 Clicks" Daily Activity     Outcome Measure   Help from another person eating meals?: None Help from another person taking care of personal grooming?: A Little Help from another person toileting, which includes using toliet, bedpan, or urinal?: A Little Help from another person bathing (including washing, rinsing, drying)?: A Little Help from another person to put on and taking off regular upper body clothing?: None Help from another person to put on and taking off regular lower body clothing?: A Little 6 Click Score: 20    End of Session    OT Visit Diagnosis: Other abnormalities of gait and mobility (R26.89)   Activity Tolerance Patient tolerated treatment well   Patient Left in chair;with call bell/phone within reach;with chair alarm set;with family/visitor present   Nurse Communication Mobility status        Time: 8466-5993 OT Time Calculation (min): 15 min  Charges: OT General Charges $OT Visit: 1 Visit OT Treatments $Self Care/Home Management : 8-22  mins  Malachy Chamber, OTR/L Acute Rehab Services Office: [phone]   Cassandra [name] 10/16/2021, 12:05 PM
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
## 2021-10-16 NOTE — Progress Notes (Addendum)
Physical Therapy Treatment Patient Details Name: Cassandra [name] MRN: [id number] DOB: Nov 01, 1932 Today's Date: 10/16/2021   History of Present Illness 88 y.o. female presents to [facility] hospital on /04/2022 for removal of L ankle hardware along with I&D 2/2 post-op infection. PMH includes DMII, HTN, HLD, breast cancer, COPD, Oxygen use at home PRN at night, OSA, CAD, PVD, gout.    PT Comments    Pt received in supine, agreeable to therapy session, c/o fatigue after poor night of sleep and noted to need increased cues for safety with transfers/gait this session and decreased recall of safe technique discussed previous day. Pt needing up to [facility] for transfers and stair training but mod cues for safety and Supervision for gait with reminders to stay closer within RW. Pt will need Supervision at home for all OOB mobility for safety due to pt decreased safety awareness. Pt continues to benefit from PT services to progress toward functional mobility goals.   Recommendations for follow up therapy are one component of a multi-disciplinary discharge planning process, led by the attending physician.  Recommendations may be updated based on patient status, additional functional criteria and insurance authorization.  Follow Up Recommendations  Home health PT (vs OPPT if HHPT unavailable)     Assistance Recommended at Discharge Intermittent Supervision/Assistance  Patient can return home with the following A little help with walking and/or transfers;A little help with bathing/dressing/bathroom;Assistance with cooking/housework;Help with stairs or ramp for entrance   Equipment Recommendations  None recommended by PT (pt owns needed DME)    Recommendations for Other Services       Precautions / Restrictions Precautions Precautions: Fall Restrictions Weight Bearing Restrictions: Yes LLE Weight Bearing: Weight bearing as tolerated     Mobility  Bed Mobility Overal bed mobility: Modified  Independent             General bed mobility comments: increased time to perform, HOB elevated    Transfers Overall transfer level: Needs assistance Equipment used: Rolling walker (2 wheels) Transfers: Sit to/from Stand Sit to Stand: Supervision, Min guard           General transfer comment: needs mod cues for safety with stand>sit onto toilet, pt was too far away and only would have sat halfway on seat. Pt instructed to use wall rail but ignoring cues and pulling on RW handles instead when sitting. Pt mildly anxious when corrected.    Ambulation/Gait Ambulation/Gait assistance: Supervision Gait Distance (Feet): 30 Feet (76ft, 50ft with seated break) Assistive device: Rolling walker (2 wheels) Gait Pattern/deviations: Step-through pattern Gait velocity: reduced     General Gait Details: pt tending to hold RW outside BOS, cues for closer RW for safety and to prevent increased LLE pain with stepping.   Stairs Stairs: Yes Stairs assistance: Min guard Stair Management: With walker, Forwards, Step to pattern Number of Stairs: 3 General stair comments: using curb step in room to simulate steps into her "den", pt with good recall of technique and no LOB. min guard for safety/RW stability   Wheelchair Mobility    Modified Rankin (Stroke Patients Only)       Balance Overall balance assessment: Needs assistance Sitting-balance support: No upper extremity supported, Feet supported Sitting balance-Leahy Scale: Normal     Standing balance support: Reliant on assistive device for balance Standing balance-Leahy Scale: Fair (using RW) Standing balance comment: no LOB using RW for mobility  Cognition Arousal/Alertness: Awake/alert Behavior During Therapy: WFL for tasks assessed/performed Overall Cognitive Status: Within Functional Limits for tasks assessed                                 General Comments: pt with  decreased safety awareness with transfers and states "You're confusing me" when pt instructed to hold wall railing prior to sitting on toilet and to line up closer to toilet (she originally was about to sit halfway onto toilet seat) and was too far away to hold on to wall rail. Of note, pt reports poor night of sleep in chart review due to anxiety regarding going home.        Exercises      General Comments General comments (skin integrity, edema, etc.): VSS per chart review, no acute s/sx distress      Pertinent Vitals/Pain Pain Assessment Pain Assessment: Faces Faces Pain Scale: Hurts little more Pain Location: LLE Pain Descriptors / Indicators: Sore, Discomfort Pain Intervention(s): Limited activity within patient's tolerance, Monitored during session, Repositioned     PT Goals (current goals can now be found in the care plan section) Acute Rehab PT Goals Patient Stated Goal: to regain the ability to ambulate for household and community distances PT Goal Formulation: With patient Time For Goal Achievement: 10/24/21 Progress towards PT goals: Progressing toward goals    Frequency    Min 3X/week      PT Plan Current plan remains appropriate       AM-PAC PT "6 Clicks" Mobility   Outcome Measure  Help needed turning from your back to your side while in a flat bed without using bedrails?: None Help needed moving from lying on your back to sitting on the side of a flat bed without using bedrails?: None Help needed moving to and from a bed to a chair (including a wheelchair)?: A Little Help needed standing up from a chair using your arms (e.g., wheelchair or bedside chair)?: A Little (increased cues for stand>sit safety) Help needed to walk in hospital room?: A Lot (mod cues for safe RW use) Help needed climbing 3-5 steps with a railing? : A Little 6 Click Score: 19    End of Session Equipment Utilized During Treatment: Gait belt Activity Tolerance: Patient tolerated  treatment well;Patient limited by pain Patient left: with call bell/phone within reach;in chair;with chair alarm set Nurse Communication: Mobility status;Other (comment) (pt asking about DC time) PT Visit Diagnosis: Other abnormalities of gait and mobility (R26.89);Muscle weakness (generalized) (M62.81)     Time: 6010-9323 PT Time Calculation (min) (ACUTE ONLY): 25 min  Charges:  $Gait Training: 8-22 mins $Therapeutic Activity: 8-22 mins                     Berenize Gatlin P., PTA Acute Rehabilitation Services Pager: [phone] Office: [place] 10/16/2021, 11:19 AM

## 2021-10-16 NOTE — Progress Notes (Signed)
° ° °  [place] for Infectious Disease  Date of Admission:  10/10/2021            Brief Progress Note:  Ms. Boghosian has been approved for Daptomycin at home. OPAT orders below:  Diagnosis: Osteomyelitis   Culture Result: Culture negative  No Known Allergies  OPAT Orders Discharge antibiotics to be given via PICC line Discharge antibiotics: Daptomycin and ceftriaxone Per pharmacy protocol   Duration: 6 weeks  End Date: 11/21/21  [facility] Care Per Protocol:  Home health RN for IV administration and teaching; PICC line care and labs.    Labs weekly while on IV antibiotics: _X_ CBC with differential _X_ BMP __ CMP _X_ CRP _X_ ESR __ Vancomycin trough _X_ CK  __ Please pull PIC at completion of IV antibiotics _X_ Please leave PIC in place until doctor has seen patient or been notified  Fax weekly labs to [phone]  Clinic Follow Up Appt:  11/02/21 at 3:45 pm with Dr. Annye Asa, [place] [facility]  10/16/2021  12:46 PM

## 2021-10-16 NOTE — Discharge Instructions (Signed)
Orthopaedic Trauma Service Discharge Instructions   General Discharge Instructions  WEIGHT BEARING STATUS:Weightbearing as tolerated  RANGE OF MOTION/ACTIVITY:Ok for ankle motion as tolerated. No restrictions  Wound Care: You may remove your surgical dressing on 10/18/21. Incisions can be left open to air if there is no drainage. If incision continues to have drainage, follow wound care instructions below. Okay to shower if no drainage from incisions.  DVT/PE prophylaxis: Aspirin and Plavix  Diet: as you were eating previously.  Can use over the counter stool softeners and bowel preparations, such as Miralax, to help with bowel movements.  Narcotics can be constipating.  Be sure to drink plenty of fluids  PAIN MEDICATION USE AND EXPECTATIONS  You have likely been given narcotic medications to help control your pain.  After a traumatic event that results in an fracture (broken bone) with or without surgery, it is ok to use narcotic pain medications to help control one's pain.  We understand that everyone responds to pain differently and each individual patient will be evaluated on a regular basis for the continued need for narcotic medications. Ideally, narcotic medication use should last no more than 6-8 weeks (coinciding with fracture healing).   As a patient it is your responsibility as well to monitor narcotic medication use and report the amount and frequency you use these medications when you come to your office visit.   We would also advise that if you are using narcotic medications, you should take a dose prior to therapy to maximize you participation.  IF YOU ARE ON NARCOTIC MEDICATIONS IT IS NOT PERMISSIBLE TO OPERATE A MOTOR VEHICLE (MOTORCYCLE/CAR/TRUCK/MOPED) OR HEAVY MACHINERY DO NOT MIX NARCOTICS WITH OTHER CNS (CENTRAL NERVOUS SYSTEM) DEPRESSANTS SUCH AS ALCOHOL   STOP SMOKING OR USING NICOTINE PRODUCTS!!!!  As discussed nicotine severely impairs your body's ability to heal  surgical and traumatic wounds but also impairs bone healing.  Wounds and bone heal by forming microscopic blood vessels (angiogenesis) and nicotine is a vasoconstrictor (essentially, shrinks blood vessels).  Therefore, if vasoconstriction occurs to these microscopic blood vessels they essentially disappear and are unable to deliver necessary nutrients to the healing tissue.  This is one modifiable factor that you can do to dramatically increase your chances of healing your injury.    (This means no smoking, no nicotine gum, patches, etc)  DO NOT USE NONSTEROIDAL ANTI-INFLAMMATORY DRUGS (NSAID'S)  Using products such as Advil (ibuprofen), Aleve (naproxen), Motrin (ibuprofen) for additional pain control during fracture healing can delay and/or prevent the healing response.  If you would like to take over the counter (OTC) medication, Tylenol (acetaminophen) is ok.  However, some narcotic medications that are given for pain control contain acetaminophen as well. Therefore, you should not exceed more than 4000 mg of tylenol in a day if you do not have liver disease.  Also note that there are may OTC medicines, such as cold medicines and allergy medicines that my contain tylenol as well.  If you have any questions about medications and/or interactions please ask your doctor/PA or your pharmacist.      ICE AND ELEVATE INJURED/OPERATIVE EXTREMITY  Using ice and elevating the injured extremity above your heart can help with swelling and pain control.  Icing in a pulsatile fashion, such as 20 minutes on and 20 minutes off, can be followed.    Do not place ice directly on skin. Make sure there is a barrier between to skin and the ice pack.    Using frozen items  such as frozen peas works well as the conform nicely to the are that needs to be iced.  USE AN ACE WRAP OR TED HOSE FOR SWELLING CONTROL  In addition to icing and elevation, Ace wraps or TED hose are used to help limit and resolve swelling.  It is  recommended to use Ace wraps or TED hose until you are informed to stop.    When using Ace Wraps start the wrapping distally (farthest away from the body) and wrap proximally (closer to the body)   Example: If you had surgery on your leg or thing and you do not have a splint on, start the ace wrap at the toes and work your way up to the thigh        If you had surgery on your upper extremity and do not have a splint on, start the ace wrap at your fingers and work your way up to the upper arm  [place]: [phone]   VISIT OUR WEBSITE FOR ADDITIONAL INFORMATION: [email or web address]     Discharge Wound Care Instructions  Do NOT apply any ointments, solutions or lotions to pin sites or surgical wounds.  These prevent needed drainage and even though solutions like hydrogen peroxide kill bacteria, they also damage cells lining the pin sites that help fight infection.  Applying lotions or ointments can keep the wounds moist and can cause them to breakdown and open up as well. This can increase the risk for infection. When in doubt call the office.  If any drainage is noted, use one layer of adaptic, then gauze, Kerlix, and an ace wrap.  Once the incision is completely dry and without drainage, it may be left open to air out.  Showering may begin 36-48 hours later.  Cleaning gently with soap and water.

## 2021-10-27 ENCOUNTER — Other Ambulatory Visit (INDEPENDENT_AMBULATORY_CARE_PROVIDER_SITE_OTHER): Payer: Self-pay | Admitting: Vascular Surgery

## 2021-10-31 ENCOUNTER — Other Ambulatory Visit: Payer: Self-pay | Admitting: Student

## 2021-10-31 DIAGNOSIS — S82892D Other fracture of left lower leg, subsequent encounter for closed fracture with routine healing: Secondary | ICD-10-CM

## 2021-11-02 ENCOUNTER — Inpatient Hospital Stay: Payer: Medicare Other | Admitting: Internal Medicine

## 2021-11-09 ENCOUNTER — Telehealth: Payer: Self-pay

## 2021-11-09 ENCOUNTER — Other Ambulatory Visit: Payer: Self-pay | Admitting: Gastroenterology

## 2021-11-09 ENCOUNTER — Encounter: Payer: Self-pay | Admitting: Internal Medicine

## 2021-11-09 ENCOUNTER — Other Ambulatory Visit: Payer: Self-pay

## 2021-11-09 ENCOUNTER — Ambulatory Visit (INDEPENDENT_AMBULATORY_CARE_PROVIDER_SITE_OTHER): Payer: Medicare Other | Admitting: Internal Medicine

## 2021-11-09 VITALS — BP 124/82 | HR 69 | Temp 97.3°F | Ht 59.0 in

## 2021-11-09 DIAGNOSIS — J449 Chronic obstructive pulmonary disease, unspecified: Secondary | ICD-10-CM | POA: Diagnosis not present

## 2021-11-09 DIAGNOSIS — M8618 Other acute osteomyelitis, other site: Secondary | ICD-10-CM

## 2021-11-09 DIAGNOSIS — K224 Dyskinesia of esophagus: Secondary | ICD-10-CM

## 2021-11-09 DIAGNOSIS — E1169 Type 2 diabetes mellitus with other specified complication: Secondary | ICD-10-CM | POA: Diagnosis not present

## 2021-11-09 DIAGNOSIS — M869 Osteomyelitis, unspecified: Secondary | ICD-10-CM

## 2021-11-09 DIAGNOSIS — T847XXD Infection and inflammatory reaction due to other internal orthopedic prosthetic devices, implants and grafts, subsequent encounter: Secondary | ICD-10-CM | POA: Diagnosis not present

## 2021-11-09 DIAGNOSIS — R1314 Dysphagia, pharyngoesophageal phase: Secondary | ICD-10-CM

## 2021-11-09 DIAGNOSIS — I2510 Atherosclerotic heart disease of native coronary artery without angina pectoris: Secondary | ICD-10-CM
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
## 2021-11-09 NOTE — Telephone Encounter (Signed)
Called patient to relay IR appointment info to have internal jugular line removed, no answer. Left HIPAA compliant voicemail requesting callback.  ? ?Appointment is at [facility] 11/22/21 - arrive at 11:30 AM.  ? ?Beryle Flock, RN ? ?

## 2021-11-09 NOTE — Patient Instructions (Signed)
Your infection at this time is currently under control ? ?You'll need at least 6 weeks (at least until 11/21/2021) for antbiotics. We will follow your blood tests and see if we can stop antibiotics around that date. And after that, will continue to check blood tests once or twice and make sure you are still doing well off of antibiotics. That is the only way to tell that your infection is gone. ? ?I would also (unless absolutely needed) avoid putting in new hardware until we know your infection is gone. ? ? ?In the mean time, if you notice increase pain, redness, pus out of the wound, or fever, chill, please let us know (your surgeon and I) immediately. And watch out for side effect of antibiotics such as nausea, vomitting, diarrhea, new joint pain/muscle pain, cough, or short of breath. ?

## 2021-11-09 NOTE — Progress Notes (Signed)
Dressing change performed on left ankle. Non-adherent pads and gauze used. Old dressing had moderate amount of bloody drainage present.  ? ?Beryle Flock, RN ? ?

## 2021-11-09 NOTE — Telephone Encounter (Signed)
Spoke with Lonn Georgia at [facility], requested they fax over most recent labs from 11/07/21. ? ?Spoke with Jeani Hawking at [facility], relayed verbal orders per Dr. Gale Journey that patient should be having CBC, CMP, CRP, and CK labs drawn. Relayed that last dose will be 11/21/21 per Dr. Gale Journey and that our office will coordinate with IR for removal of tunneled CVC (IJ).  ? ?Beryle Flock, RN ? ?

## 2021-11-12 NOTE — Telephone Encounter (Signed)
Called patient to relay appointment information, no answer. Voicemail left with previous attempt.  ? ?Beryle Flock, RN ? ?

## 2021-11-13 ENCOUNTER — Other Ambulatory Visit: Payer: Self-pay

## 2021-11-13 ENCOUNTER — Ambulatory Visit
Admission: RE | Admit: 2021-11-13 | Discharge: 2021-11-13 | Disposition: A | Discharge status: Home / Self Care | Payer: Medicare Other | Source: Ambulatory Visit | Attending: Student | Admitting: Student

## 2021-11-13 DIAGNOSIS — S82892D Other fracture of left lower leg, subsequent encounter for closed fracture with routine healing: Secondary | ICD-10-CM | POA: Insufficient documentation

## 2021-11-13 NOTE — Telephone Encounter (Signed)
Attempted to reach patient again, no answer. Spoke with patient's daughter, Arby Barrette, who is listed in chart as a contact. Provided her with IR appointment information, Arby Barrette states she will relay this to patient. Asked her to please let the patient know to call with any questions or concerns.  ? ?Beryle Flock, RN ? ?

## 2021-11-14 ENCOUNTER — Ambulatory Visit
Admission: RE | Admit: 2021-11-14 | Discharge: 2021-11-14 | Disposition: A | Discharge status: Home / Self Care | Payer: Medicare Other | Source: Ambulatory Visit | Attending: Gastroenterology | Admitting: Gastroenterology

## 2021-11-14 DIAGNOSIS — R1314 Dysphagia, pharyngoesophageal phase: Secondary | ICD-10-CM | POA: Diagnosis present

## 2021-11-14 DIAGNOSIS — K224 Dyskinesia of esophagus: Secondary | ICD-10-CM | POA: Diagnosis present

## 2021-11-19 ENCOUNTER — Other Ambulatory Visit: Payer: Self-pay | Admitting: Physical Medicine & Rehabilitation

## 2021-11-22 ENCOUNTER — Other Ambulatory Visit: Payer: Self-pay

## 2021-11-22 ENCOUNTER — Ambulatory Visit (HOSPITAL_COMMUNITY)
Admission: RE | Admit: 2021-11-22 | Discharge: 2021-11-22 | Disposition: A | Discharge status: Home / Self Care | Payer: Medicare Other | Source: Ambulatory Visit | Attending: Internal Medicine | Admitting: Internal Medicine

## 2021-11-22 ENCOUNTER — Other Ambulatory Visit (INDEPENDENT_AMBULATORY_CARE_PROVIDER_SITE_OTHER): Payer: Self-pay | Admitting: Vascular Surgery

## 2021-11-22 DIAGNOSIS — Y839 Surgical procedure, unspecified as the cause of abnormal reaction of the patient, or of later complication, without mention of misadventure at the time of the procedure: Secondary | ICD-10-CM | POA: Diagnosis not present

## 2021-11-22 DIAGNOSIS — M869 Osteomyelitis, unspecified: Secondary | ICD-10-CM | POA: Diagnosis not present

## 2021-11-22 DIAGNOSIS — Z452 Encounter for adjustment and management of vascular access device: Secondary | ICD-10-CM | POA: Insufficient documentation

## 2021-11-22 DIAGNOSIS — T847XXD Infection and inflammatory reaction due to other internal orthopedic prosthetic devices, implants and grafts, subsequent encounter: Secondary | ICD-10-CM | POA: Diagnosis not present

## 2021-11-22 HISTORY — PX: IR REMOVAL TUN CV CATH W/O FL: IMG2289

## 2021-11-22 NOTE — Progress Notes (Signed)
Interventional Radiology Procedure Note ? ?PROCEDURE SUMMARY: ? ?Successful removal of tunneled catheter. ? ?No complications.  ? ?EBL = trace ? ?Please see full dictation in imaging section of Epic for procedure details.  ? ? ?Narda Rutherford, AGNP-BC ?11/22/2021, 12:33 PM ? ? ?

## 2021-11-24 ENCOUNTER — Other Ambulatory Visit (INDEPENDENT_AMBULATORY_CARE_PROVIDER_SITE_OTHER): Payer: Self-pay | Admitting: Vascular Surgery

## 2021-11-28 ENCOUNTER — Ambulatory Visit (INDEPENDENT_AMBULATORY_CARE_PROVIDER_SITE_OTHER): Payer: Medicare Other | Admitting: Internal Medicine

## 2021-11-28 ENCOUNTER — Other Ambulatory Visit: Payer: Self-pay

## 2021-11-28 ENCOUNTER — Encounter: Payer: Self-pay | Admitting: Internal Medicine

## 2021-11-28 VITALS — BP 138/72 | HR 58 | Temp 97.5°F | Ht 59.0 in | Wt 144.0 lb

## 2021-11-28 DIAGNOSIS — M869 Osteomyelitis, unspecified: Secondary | ICD-10-CM | POA: Diagnosis not present

## 2021-11-28 MED ORDER — CEFDINIR 300 MG PO CAPS: 300.0000 mg | ORAL_CAPSULE | Freq: Two times a day (BID) | ORAL | 0 refills | Status: DC

## 2021-11-28 MED ORDER — DOXYCYCLINE HYCLATE 100 MG PO TABS: 100.0000 mg | ORAL_TABLET | Freq: Two times a day (BID) | ORAL | 0 refills | Status: DC

## 2021-11-28 MED ORDER — CEFDINIR 300 MG PO CAPS
300.0000 mg | ORAL_CAPSULE | Freq: Two times a day (BID) | ORAL | 0 refills | Status: DC
Start: 2021-11-28 — End: 2021-11-28

## 2021-11-28 NOTE — Patient Instructions (Signed)
Lets start doxycycline and omnicef for your infection. I am concerned about the inside of the ankle opening up ? ?Blood test Monday ? ?Ct scan to be scheduled within 1-2 weeks ? ?See me a week after the scan ?
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
## 2021-12-03 ENCOUNTER — Other Ambulatory Visit: Payer: Self-pay

## 2021-12-03 ENCOUNTER — Other Ambulatory Visit: Payer: Medicare Other

## 2021-12-03 DIAGNOSIS — M869 Osteomyelitis, unspecified: Secondary | ICD-10-CM

## 2021-12-04 LAB — BASIC METABOLIC PANEL
BUN/Creatinine Ratio: 29 (calc) — ABNORMAL HIGH (ref 6–22)
BUN: 37 mg/dL — ABNORMAL HIGH (ref 7–25)
CO2: 26 mmol/L (ref 20–32)
Calcium: 9.1 mg/dL (ref 8.6–10.4)
Chloride: 96 mmol/L — ABNORMAL LOW (ref 98–110)
Creat: 1.27 mg/dL — ABNORMAL HIGH (ref 0.60–0.95)
Glucose, Bld: 120 mg/dL — ABNORMAL HIGH (ref 65–99)
Potassium: 4.6 mmol/L (ref 3.5–5.3)
Sodium: 136 mmol/L (ref 135–146)

## 2021-12-04 LAB — C-REACTIVE PROTEIN: CRP: 6 mg/L (ref <8.0)

## 2021-12-05 ENCOUNTER — Other Ambulatory Visit: Payer: Self-pay | Admitting: Gastroenterology

## 2021-12-05 DIAGNOSIS — R933 Abnormal findings on diagnostic imaging of other parts of digestive tract: Secondary | ICD-10-CM

## 2021-12-05 DIAGNOSIS — R1314 Dysphagia, pharyngoesophageal phase: Secondary | ICD-10-CM

## 2021-12-10 ENCOUNTER — Ambulatory Visit
Admission: RE | Admit: 2021-12-10 | Discharge: 2021-12-10 | Disposition: A | Discharge status: Home / Self Care | Payer: Medicare Other | Source: Ambulatory Visit | Attending: Internal Medicine | Admitting: Internal Medicine

## 2021-12-10 DIAGNOSIS — M869 Osteomyelitis, unspecified: Secondary | ICD-10-CM | POA: Insufficient documentation

## 2021-12-10 MED ORDER — IOHEXOL 300 MG/ML  SOLN
75.0000 mL | Freq: Once | INTRAMUSCULAR | Status: AC | PRN
  Administered 2021-12-10: 75 mL via INTRAVENOUS

## 2021-12-18 ENCOUNTER — Telehealth: Payer: Self-pay

## 2021-12-18 ENCOUNTER — Ambulatory Visit: Payer: Self-pay | Admitting: Student

## 2021-12-18 NOTE — Telephone Encounter (Signed)
-----   Message from Jabier Mutton, MD sent at 12/18/2021 10:10 AM EDT ----- ?Hi Lennette Bihari ?The CT looks concerning. I think we should go ahead and I&D and send for deep culture ? ?Please let me know your thought. My number is [phone] if you want to talk directly ? ?Team [facility] could you schedule appointment for her with me next week to discuss result and see how she does. thanks ?

## 2021-12-18 NOTE — Progress Notes (Signed)
I saw her today and we are going to plan for repeat I&D and cultures on Friday 4/21. Can we still plan to send the sample to outside for RNA sequencing in addition to cultures in house? How long would you think she needs to stay in house postop?
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
## 2021-12-18 NOTE — Telephone Encounter (Signed)
Patient already scheduled with you on 12/26/21  ?

## 2021-12-19 ENCOUNTER — Other Ambulatory Visit: Payer: Self-pay | Admitting: Internal Medicine

## 2021-12-19 DIAGNOSIS — R634 Abnormal weight loss: Secondary | ICD-10-CM

## 2021-12-19 DIAGNOSIS — R112 Nausea with vomiting, unspecified: Secondary | ICD-10-CM

## 2021-12-20 ENCOUNTER — Other Ambulatory Visit: Payer: Self-pay

## 2021-12-20 ENCOUNTER — Other Ambulatory Visit (HOSPITAL_COMMUNITY): Payer: Self-pay

## 2021-12-20 ENCOUNTER — Encounter (HOSPITAL_COMMUNITY): Payer: Self-pay | Admitting: Student

## 2021-12-20 NOTE — Anesthesia Preprocedure Evaluation (Addendum)
Anesthesia Evaluation  ?Patient identified by MRN, date of birth, ID band ?Patient awake ? ? ? ?Reviewed: ?Allergy & Precautions, NPO status , Patient's Chart, lab work & pertinent test results ? ?Airway ?Mallampati: II ? ?TM Distance: >3 FB ?Neck ROM: Full ? ? ? Dental ? ?(+) Edentulous Upper, Edentulous Lower, Dental Advisory Given ?  ?Pulmonary ?sleep apnea (O2 at night) and Oxygen sleep apnea , COPD,  oxygen dependent, former smoker,  ?  ?Pulmonary exam normal ?breath sounds clear to auscultation ? ? ? ? ? ? Cardiovascular ?hypertension, Pt. on medications ?+ CAD, + Past MI, + Cardiac Stents and + Peripheral Vascular Disease  ?Normal cardiovascular exam+ dysrhythmias Atrial Fibrillation + pacemaker  ?Rhythm:Regular Rate:Normal ? ?TTE 2022 ??1. Left ventricular ejection fraction, by estimation, is 60 to 65%. The  ?left ventricle has normal function. The left ventricle has no regional  ?wall motion abnormalities. There is mild concentric left ventricular  ?hypertrophy. Left ventricular diastolic  ?parameters are consistent with Grade II diastolic dysfunction  ?(pseudonormalization). Elevated left atrial pressure. There is the  ?interventricular septum is flattened in diastole ('D' shaped left  ?ventricle), consistent with right ventricular volume  ?overload.  ??2. Right ventricular systolic function is normal. The right ventricular  ?size is mildly enlarged. There is moderately elevated pulmonary artery  ?systolic pressure.  ??3. Left atrial size was mildly dilated.  ??4. Right atrial size was mildly dilated.  ??5. The mitral valve is normal in structure. No evidence of mitral valve  ?regurgitation.  ??6. Tricuspid valve regurgitation is severe.  ??7. The aortic valve is tricuspid. There is mild calcification of the  ?aortic valve. There is mild thickening of the aortic valve. Aortic valve  ?regurgitation is not visualized. Mild aortic valve sclerosis is present,  ?with no  evidence of aortic valve  ? ?Cath 2017 ?Assessment ?The patient has had an acute non-ST elevation myocardial infarction with causes and risk factors including diabetes, high blood pressure and high cholesterol. ?? ?Left ventricular angiogram shows normal LV systolic function ejection fraction of 55% ?? ?severe 2 vessel coronary artery disease with occlusion of the right coronary artery and reasonable collateralization. There is significant stenosis of the proximal left anterior descending artery with calcification and 75% likely causing significant symptoms ?? ?There is significant coronary artery disease and or stenosis involving left anterior descending and appears to be the culprit to her current symptoms ?? ?Plan ?PCI and stent placement of left anterior descending artery ?Medical management of occluded right coronary artery with reasonable collaterals ? ?  ?Neuro/Psych ? Headaches, PSYCHIATRIC DISORDERS Anxiety Depression   ? GI/Hepatic ?  ?Endo/Other  ?diabetes (BG 171), Type 2, Insulin Dependent, Oral Hypoglycemic AgentsHypothyroidism  ? Renal/GU ?Renal InsufficiencyRenal disease  ? ?  ?Musculoskeletal ? ?(+) Arthritis ,  ? Abdominal ?  ?Peds ? Hematology ? ?(+) Blood dyscrasia (on plavix), ,   ?Anesthesia Other Findings ? ? Reproductive/Obstetrics ? ?  ? ? ? ? ? ? ? ? ? ? ? ? ? ?  ?  ? ? ? ? ? ? ?Anesthesia Physical ?Anesthesia Plan ? ?ASA: 3 ? ?Anesthesia Plan: General  ? ?Post-op Pain Management: Tylenol PO (pre-op)*  ? ?Induction: Intravenous ? ?PONV Risk Score and Plan: 3 and Dexamethasone, Ondansetron and Treatment may vary due to age or medical condition ? ?Airway Management Planned: Oral ETT and LMA ? ?Additional Equipment:  ? ?Intra-op Plan:  ? ?Post-operative Plan: Extubation in OR ? ?Informed Consent: I have reviewed the patients History and  Physical, chart, labs and discussed the procedure including the risks, benefits and alternatives for the proposed anesthesia with the patient or authorized  representative who has indicated his/her understanding and acceptance.  ? ? ? ?Dental advisory given ? ?Plan Discussed with: CRNA ? ?Anesthesia Plan Comments: (PAT note written 12/20/2021 by Myra Gianotti, PA-C. ? ? ?)  ? ? ? ? ?Anesthesia Quick Evaluation ? ?
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
## 2021-12-20 NOTE — Progress Notes (Signed)
Spoke with pt for pre-op call. Pt has hx of A-flutter and has a pacemaker. Pt denies any recent chest pain or shortness of breath. Pt is diabetic. Last A1C was 6.7 on 12/19/21. States her fasting blood sugar is usually around 90, but has been up to 140 recently (pt does have a bad infection in ankle). Instructed her to take 1/2 of her regular dose of Lantus insulin tonight and not to take her oral medications (Metformin and Actos) in the AM. Pt instructed to check her blood sugar in the AM when she wakes up. If blood sugar is 70 or below, treat with 1/2 cup of clear juice (apple or cranberry) and recheck blood sugar 15 minutes after drinking juice. Instructed pt to let nurse know on arrival in the AM if she had to drink the juice. She voiced understanding.  ?Pt's medications are in blister packs and she does not know what each pill is. I instructed her not to take any of her medications in the AM. She voiced understanding.  ?I have called and spoken with one of our pharmacy techs, Lenna Sciara because the patient's med list is not completed. I told her that the pt asked that they call her pharmacy, [facility] in [place] and get the medication list from them. Lenna Sciara has called them but stated that the patient's husband came into the pharmacy that all her meds had been stopped except for a couple of pills. I have called Dr. Fulton Reek' office and left a message for his nurse to call me to see what exactly Dr. [name] discontinued. Still awaiting the call back.  ?

## 2021-12-20 NOTE — Progress Notes (Signed)
Melissa, pharmacy tech called me back and she states she called the pharmacy that the pt uses and [facility] told her that pt's husband had been in and changed her medications. Melissa states she has tried to update the med list the best she could. Dr. [name]' nurse did call me and gave me the list of medications that pt is supposed to be taking. I updated the med list again. I attempted to call Suhailah back and make sure she understands that her Lantus insulin is 42 units at bedtime. She had told me she was taking 10 units. She did not answer the phone, so I will try her again before I leave for the evening.  ?

## 2021-12-21 ENCOUNTER — Encounter (HOSPITAL_COMMUNITY): Payer: Self-pay | Admitting: Student

## 2021-12-21 ENCOUNTER — Inpatient Hospital Stay (HOSPITAL_COMMUNITY): Payer: Medicare Other | Admitting: Anesthesiology

## 2021-12-21 ENCOUNTER — Encounter (HOSPITAL_COMMUNITY)
Admission: RE | Disposition: A | Discharge status: Home / Self Care | Payer: Self-pay | Source: Home / Self Care | Attending: Student

## 2021-12-21 ENCOUNTER — Inpatient Hospital Stay (HOSPITAL_COMMUNITY)
Admission: RE | Admit: 2021-12-21 | Discharge: 2021-12-23 | DRG: 982 | Disposition: A | Discharge status: Home / Self Care | Payer: Medicare Other | Attending: Student | Admitting: Student

## 2021-12-21 ENCOUNTER — Other Ambulatory Visit: Payer: Self-pay

## 2021-12-21 ENCOUNTER — Other Ambulatory Visit (HOSPITAL_COMMUNITY): Payer: Self-pay

## 2021-12-21 ENCOUNTER — Inpatient Hospital Stay: Payer: Self-pay

## 2021-12-21 DIAGNOSIS — J439 Emphysema, unspecified: Secondary | ICD-10-CM | POA: Diagnosis present

## 2021-12-21 DIAGNOSIS — T8142XA Infection following a procedure, deep incisional surgical site, initial encounter: Secondary | ICD-10-CM

## 2021-12-21 DIAGNOSIS — Z85828 Personal history of other malignant neoplasm of skin: Secondary | ICD-10-CM

## 2021-12-21 DIAGNOSIS — M869 Osteomyelitis, unspecified: Secondary | ICD-10-CM | POA: Diagnosis present

## 2021-12-21 DIAGNOSIS — Z950 Presence of cardiac pacemaker: Secondary | ICD-10-CM | POA: Diagnosis not present

## 2021-12-21 DIAGNOSIS — Z8616 Personal history of COVID-19: Secondary | ICD-10-CM | POA: Diagnosis not present

## 2021-12-21 DIAGNOSIS — Z9049 Acquired absence of other specified parts of digestive tract: Secondary | ICD-10-CM

## 2021-12-21 DIAGNOSIS — M109 Gout, unspecified: Secondary | ICD-10-CM | POA: Diagnosis present

## 2021-12-21 DIAGNOSIS — Z9221 Personal history of antineoplastic chemotherapy: Secondary | ICD-10-CM

## 2021-12-21 DIAGNOSIS — Z853 Personal history of malignant neoplasm of breast: Secondary | ICD-10-CM | POA: Diagnosis not present

## 2021-12-21 DIAGNOSIS — M1990 Unspecified osteoarthritis, unspecified site: Secondary | ICD-10-CM | POA: Diagnosis present

## 2021-12-21 DIAGNOSIS — Z91148 Patient's other noncompliance with medication regimen for other reason: Secondary | ICD-10-CM

## 2021-12-21 DIAGNOSIS — M86662 Other chronic osteomyelitis, left tibia and fibula: Secondary | ICD-10-CM | POA: Diagnosis present

## 2021-12-21 DIAGNOSIS — G2581 Restless legs syndrome: Secondary | ICD-10-CM | POA: Diagnosis present

## 2021-12-21 DIAGNOSIS — I10 Essential (primary) hypertension: Secondary | ICD-10-CM

## 2021-12-21 DIAGNOSIS — I4891 Unspecified atrial fibrillation: Secondary | ICD-10-CM

## 2021-12-21 DIAGNOSIS — E1169 Type 2 diabetes mellitus with other specified complication: Secondary | ICD-10-CM | POA: Diagnosis present

## 2021-12-21 DIAGNOSIS — Z87891 Personal history of nicotine dependence: Secondary | ICD-10-CM | POA: Diagnosis not present

## 2021-12-21 DIAGNOSIS — E039 Hypothyroidism, unspecified: Secondary | ICD-10-CM | POA: Diagnosis present

## 2021-12-21 DIAGNOSIS — F419 Anxiety disorder, unspecified: Secondary | ICD-10-CM | POA: Diagnosis present

## 2021-12-21 DIAGNOSIS — E7800 Pure hypercholesterolemia, unspecified: Secondary | ICD-10-CM | POA: Diagnosis present

## 2021-12-21 DIAGNOSIS — Z881 Allergy status to other antibiotic agents status: Secondary | ICD-10-CM

## 2021-12-21 DIAGNOSIS — E1142 Type 2 diabetes mellitus with diabetic polyneuropathy: Secondary | ICD-10-CM | POA: Diagnosis present

## 2021-12-21 DIAGNOSIS — L02416 Cutaneous abscess of left lower limb: Secondary | ICD-10-CM | POA: Diagnosis present

## 2021-12-21 DIAGNOSIS — Z95828 Presence of other vascular implants and grafts: Secondary | ICD-10-CM | POA: Diagnosis not present

## 2021-12-21 DIAGNOSIS — I2510 Atherosclerotic heart disease of native coronary artery without angina pectoris: Secondary | ICD-10-CM

## 2021-12-21 DIAGNOSIS — I252 Old myocardial infarction: Secondary | ICD-10-CM

## 2021-12-21 DIAGNOSIS — Z955 Presence of coronary angioplasty implant and graft: Secondary | ICD-10-CM

## 2021-12-21 DIAGNOSIS — I700 Atherosclerosis of aorta: Secondary | ICD-10-CM | POA: Diagnosis present

## 2021-12-21 DIAGNOSIS — G4733 Obstructive sleep apnea (adult) (pediatric): Secondary | ICD-10-CM | POA: Diagnosis present

## 2021-12-21 DIAGNOSIS — M86672 Other chronic osteomyelitis, left ankle and foot: Principal | ICD-10-CM

## 2021-12-21 HISTORY — PX: I & D EXTREMITY: SHX5045

## 2021-12-21 LAB — SURGICAL PCR SCREEN
MRSA, PCR: NEGATIVE
Staphylococcus aureus: NEGATIVE

## 2021-12-21 LAB — GLUCOSE, CAPILLARY
Glucose-Capillary: 165 mg/dL — ABNORMAL HIGH (ref 70–99)
Glucose-Capillary: 253 mg/dL — ABNORMAL HIGH (ref 70–99)
Glucose-Capillary: 312 mg/dL — ABNORMAL HIGH (ref 70–99)

## 2021-12-21 SURGERY — IRRIGATION AND DEBRIDEMENT EXTREMITY
Anesthesia: General | Laterality: Left

## 2021-12-21 MED ORDER — HYDROCODONE-ACETAMINOPHEN 5-325 MG PO TABS
ORAL_TABLET | ORAL | Status: AC
  Filled 2021-12-21: qty 1

## 2021-12-21 MED ORDER — ONDANSETRON HCL 4 MG PO TABS: 4.0000 mg | ORAL_TABLET | Freq: Four times a day (QID) | ORAL | Status: DC | PRN

## 2021-12-21 MED ORDER — BUPIVACAINE HCL (PF) 0.25 % IJ SOLN
INTRAMUSCULAR | Status: DC | PRN
  Administered 2021-12-21: 10 mL

## 2021-12-21 MED ORDER — DOXYCYCLINE HYCLATE 100 MG PO TABS
100.0000 mg | ORAL_TABLET | Freq: Two times a day (BID) | ORAL | 0 refills | Status: DC
  Filled 2021-12-21: qty 56, 28d supply, fill #0

## 2021-12-21 MED ORDER — ACETAMINOPHEN 325 MG PO TABS: 325.0000 mg | ORAL_TABLET | Freq: Four times a day (QID) | ORAL | Status: DC | PRN

## 2021-12-21 MED ORDER — CHLORHEXIDINE GLUCONATE 0.12 % MT SOLN: 15.0000 mL | Freq: Once | OROMUCOSAL | Status: AC

## 2021-12-21 MED ORDER — PANTOPRAZOLE SODIUM 40 MG PO TBEC
40.0000 mg | DELAYED_RELEASE_TABLET | Freq: Every day | ORAL | Status: DC
  Administered 2021-12-21 – 2021-12-23 (×3): 40 mg via ORAL
  Filled 2021-12-21 (×3): qty 1

## 2021-12-21 MED ORDER — PHENYLEPHRINE 80 MCG/ML (10ML) SYRINGE FOR IV PUSH (FOR BLOOD PRESSURE SUPPORT)
PREFILLED_SYRINGE | INTRAVENOUS | Status: AC
  Filled 2021-12-21: qty 10

## 2021-12-21 MED ORDER — ROPINIROLE HCL 0.5 MG PO TABS: 0.5000 mg | ORAL_TABLET | Freq: Every day | ORAL | Status: DC

## 2021-12-21 MED ORDER — INSULIN ASPART 100 UNIT/ML IJ SOLN
0.0000 [IU] | Freq: Three times a day (TID) | INTRAMUSCULAR | Status: DC
  Administered 2021-12-21: 11 [IU] via SUBCUTANEOUS
  Administered 2021-12-22: 5 [IU] via SUBCUTANEOUS
  Administered 2021-12-22 – 2021-12-23 (×2): 2 [IU] via SUBCUTANEOUS
  Administered 2021-12-23: 5 [IU] via SUBCUTANEOUS

## 2021-12-21 MED ORDER — EPHEDRINE SULFATE-NACL 50-0.9 MG/10ML-% IV SOSY
PREFILLED_SYRINGE | INTRAVENOUS | Status: DC | PRN
  Administered 2021-12-21: 5 mg via INTRAVENOUS

## 2021-12-21 MED ORDER — PIOGLITAZONE HCL 30 MG PO TABS: 30.0000 mg | ORAL_TABLET | Freq: Every day | ORAL | Status: DC

## 2021-12-21 MED ORDER — ONDANSETRON HCL 4 MG/2ML IJ SOLN: 4.0000 mg | Freq: Four times a day (QID) | INTRAMUSCULAR | Status: DC | PRN

## 2021-12-21 MED ORDER — TRAMADOL HCL 50 MG PO TABS
50.0000 mg | ORAL_TABLET | Freq: Four times a day (QID) | ORAL | Status: DC
  Administered 2021-12-21 – 2021-12-23 (×7): 50 mg via ORAL
  Filled 2021-12-21 (×9): qty 1

## 2021-12-21 MED ORDER — ONDANSETRON HCL 4 MG/2ML IJ SOLN
INTRAMUSCULAR | Status: DC | PRN
  Administered 2021-12-21: 4 mg via INTRAVENOUS

## 2021-12-21 MED ORDER — VENLAFAXINE HCL ER 75 MG PO CP24
75.0000 mg | ORAL_CAPSULE | Freq: Every day | ORAL | Status: DC
  Administered 2021-12-22 – 2021-12-23 (×2): 75 mg via ORAL
  Filled 2021-12-21 (×2): qty 1

## 2021-12-21 MED ORDER — MAGNESIUM OXIDE -MG SUPPLEMENT 400 (240 MG) MG PO TABS: 400.0000 mg | ORAL_TABLET | Freq: Two times a day (BID) | ORAL | Status: DC

## 2021-12-21 MED ORDER — FENTANYL CITRATE (PF) 250 MCG/5ML IJ SOLN
INTRAMUSCULAR | Status: DC | PRN
  Administered 2021-12-21 (×2): 50 ug via INTRAVENOUS
  Administered 2021-12-21: 25 ug via INTRAVENOUS

## 2021-12-21 MED ORDER — SODIUM CHLORIDE 0.9 % IV SOLN: INTRAVENOUS | Status: DC

## 2021-12-21 MED ORDER — 0.9 % SODIUM CHLORIDE (POUR BTL) OPTIME
TOPICAL | Status: DC | PRN
  Administered 2021-12-21: 1000 mL

## 2021-12-21 MED ORDER — BACITRACIN ZINC 500 UNIT/GM EX OINT
TOPICAL_OINTMENT | CUTANEOUS | Status: AC
  Filled 2021-12-21: qty 28.35

## 2021-12-21 MED ORDER — ACETAMINOPHEN 500 MG PO TABS
1000.0000 mg | ORAL_TABLET | Freq: Once | ORAL | Status: AC
  Administered 2021-12-21: 1000 mg via ORAL
  Filled 2021-12-21: qty 2

## 2021-12-21 MED ORDER — TOBRAMYCIN SULFATE 1.2 G IJ SOLR
INTRAMUSCULAR | Status: DC | PRN
  Administered 2021-12-21: 1.2 g

## 2021-12-21 MED ORDER — DEXAMETHASONE SODIUM PHOSPHATE 10 MG/ML IJ SOLN
INTRAMUSCULAR | Status: AC
  Filled 2021-12-21: qty 1

## 2021-12-21 MED ORDER — PHENYLEPHRINE 80 MCG/ML (10ML) SYRINGE FOR IV PUSH (FOR BLOOD PRESSURE SUPPORT)
PREFILLED_SYRINGE | INTRAVENOUS | Status: DC | PRN
  Administered 2021-12-21 (×3): 80 ug via INTRAVENOUS
  Administered 2021-12-21: 160 ug via INTRAVENOUS
  Administered 2021-12-21: 80 ug via INTRAVENOUS

## 2021-12-21 MED ORDER — ROSUVASTATIN CALCIUM 5 MG PO TABS: 10.0000 mg | ORAL_TABLET | Freq: Every day | ORAL | Status: DC

## 2021-12-21 MED ORDER — MORPHINE SULFATE (PF) 2 MG/ML IV SOLN: 0.5000 mg | INTRAVENOUS | Status: DC | PRN

## 2021-12-21 MED ORDER — INSULIN ASPART 100 UNIT/ML IJ SOLN: 0.0000 [IU] | INTRAMUSCULAR | Status: DC | PRN

## 2021-12-21 MED ORDER — PHENYLEPHRINE HCL-NACL 20-0.9 MG/250ML-% IV SOLN
INTRAVENOUS | Status: DC | PRN
Start: 2021-12-21 — End: 2021-12-21
  Administered 2021-12-21: 40 ug/min via INTRAVENOUS

## 2021-12-21 MED ORDER — ASPIRIN EC 81 MG PO TBEC
81.0000 mg | DELAYED_RELEASE_TABLET | Freq: Every day | ORAL | Status: DC
  Administered 2021-12-21: 81 mg via ORAL
  Filled 2021-12-21: qty 1

## 2021-12-21 MED ORDER — FENTANYL CITRATE (PF) 250 MCG/5ML IJ SOLN
INTRAMUSCULAR | Status: AC
  Filled 2021-12-21: qty 5

## 2021-12-21 MED ORDER — FUROSEMIDE 20 MG PO TABS
20.0000 mg | ORAL_TABLET | Freq: Every day | ORAL | Status: DC
Start: 2021-12-21 — End: 2021-12-23
  Administered 2021-12-21 – 2021-12-23 (×3): 20 mg via ORAL
  Filled 2021-12-21 (×3): qty 1

## 2021-12-21 MED ORDER — SODIUM CHLORIDE 0.9 % IV SOLN
2.0000 g | INTRAVENOUS | Status: DC
  Administered 2021-12-21 – 2021-12-22 (×2): 2 g via INTRAVENOUS
  Filled 2021-12-21 (×3): qty 20

## 2021-12-21 MED ORDER — METOPROLOL SUCCINATE 12.5 MG HALF TABLET: 12.5000 mg | ORAL_TABLET | Freq: Every day | ORAL | Status: DC

## 2021-12-21 MED ORDER — PROPOFOL 10 MG/ML IV BOLUS
INTRAVENOUS | Status: DC | PRN
  Administered 2021-12-21: 40 mg via INTRAVENOUS
  Administered 2021-12-21: 100 mg via INTRAVENOUS

## 2021-12-21 MED ORDER — ORAL CARE MOUTH RINSE: 15.0000 mL | Freq: Once | OROMUCOSAL | Status: AC

## 2021-12-21 MED ORDER — HYDROCODONE-ACETAMINOPHEN 5-325 MG PO TABS: 1.0000 | ORAL_TABLET | ORAL | Status: DC | PRN

## 2021-12-21 MED ORDER — LACTATED RINGERS IV SOLN: INTRAVENOUS | Status: DC

## 2021-12-21 MED ORDER — SODIUM CHLORIDE 0.9 % IV SOLN
8.0000 mg/kg | Freq: Every day | INTRAVENOUS | Status: DC
  Administered 2021-12-21 – 2021-12-22 (×2): 500 mg via INTRAVENOUS
  Filled 2021-12-21 (×4): qty 10

## 2021-12-21 MED ORDER — FENTANYL CITRATE (PF) 100 MCG/2ML IJ SOLN
INTRAMUSCULAR | Status: AC
  Filled 2021-12-21: qty 2

## 2021-12-21 MED ORDER — METOCLOPRAMIDE HCL 5 MG PO TABS: 5.0000 mg | ORAL_TABLET | Freq: Three times a day (TID) | ORAL | Status: DC | PRN

## 2021-12-21 MED ORDER — FENTANYL CITRATE (PF) 100 MCG/2ML IJ SOLN
25.0000 ug | INTRAMUSCULAR | Status: DC | PRN
  Administered 2021-12-21 (×2): 25 ug via INTRAVENOUS

## 2021-12-21 MED ORDER — LEVOTHYROXINE SODIUM 125 MCG PO TABS
125.0000 ug | ORAL_TABLET | Freq: Every day | ORAL | Status: DC
  Administered 2021-12-21 – 2021-12-22 (×2): 125 ug via ORAL
  Filled 2021-12-21 (×3): qty 1

## 2021-12-21 MED ORDER — TOBRAMYCIN SULFATE 1.2 G IJ SOLR
INTRAMUSCULAR | Status: AC
  Filled 2021-12-21: qty 1.2

## 2021-12-21 MED ORDER — PAROXETINE HCL 10 MG PO TABS
10.0000 mg | ORAL_TABLET | Freq: Every day | ORAL | Status: DC
  Filled 2021-12-21: qty 1

## 2021-12-21 MED ORDER — DOCUSATE SODIUM 100 MG PO CAPS
100.0000 mg | ORAL_CAPSULE | Freq: Two times a day (BID) | ORAL | Status: DC
  Administered 2021-12-21 – 2021-12-23 (×5): 100 mg via ORAL
  Filled 2021-12-21 (×5): qty 1

## 2021-12-21 MED ORDER — LIDOCAINE 2% (20 MG/ML) 5 ML SYRINGE
INTRAMUSCULAR | Status: AC
  Filled 2021-12-21: qty 5

## 2021-12-21 MED ORDER — VANCOMYCIN HCL 1000 MG IV SOLR
INTRAVENOUS | Status: AC
  Filled 2021-12-21: qty 20

## 2021-12-21 MED ORDER — VITAMIN D 25 MCG (1000 UNIT) PO TABS
1000.0000 [IU] | ORAL_TABLET | Freq: Every day | ORAL | Status: DC
  Administered 2021-12-21 – 2021-12-23 (×3): 1000 [IU] via ORAL
  Filled 2021-12-21 (×3): qty 1

## 2021-12-21 MED ORDER — QUETIAPINE FUMARATE 25 MG PO TABS: 25.0000 mg | ORAL_TABLET | Freq: Every day | ORAL | Status: DC

## 2021-12-21 MED ORDER — PROPOFOL 10 MG/ML IV BOLUS
INTRAVENOUS | Status: AC
  Filled 2021-12-21: qty 20

## 2021-12-21 MED ORDER — HYDRALAZINE HCL 10 MG PO TABS: 10.0000 mg | ORAL_TABLET | Freq: Four times a day (QID) | ORAL | Status: DC | PRN

## 2021-12-21 MED ORDER — EPHEDRINE 5 MG/ML INJ
INTRAVENOUS | Status: AC
  Filled 2021-12-21: qty 5

## 2021-12-21 MED ORDER — LIDOCAINE 2% (20 MG/ML) 5 ML SYRINGE
INTRAMUSCULAR | Status: DC | PRN
  Administered 2021-12-21: 60 mg via INTRAVENOUS

## 2021-12-21 MED ORDER — METFORMIN HCL ER 500 MG PO TB24: 500.0000 mg | ORAL_TABLET | Freq: Two times a day (BID) | ORAL | Status: DC

## 2021-12-21 MED ORDER — SODIUM CHLORIDE 0.9 % IR SOLN
Status: DC | PRN
  Administered 2021-12-21: 3000 mL

## 2021-12-21 MED ORDER — ALBUTEROL SULFATE (2.5 MG/3ML) 0.083% IN NEBU: 2.5000 mg | INHALATION_SOLUTION | Freq: Four times a day (QID) | RESPIRATORY_TRACT | Status: DC | PRN

## 2021-12-21 MED ORDER — CEFAZOLIN SODIUM 1 G IJ SOLR
INTRAMUSCULAR | Status: AC
  Filled 2021-12-21: qty 20

## 2021-12-21 MED ORDER — METOCLOPRAMIDE HCL 5 MG/ML IJ SOLN: 5.0000 mg | Freq: Three times a day (TID) | INTRAMUSCULAR | Status: DC | PRN

## 2021-12-21 MED ORDER — POLYETHYLENE GLYCOL 3350 17 G PO PACK: 17.0000 g | PACK | Freq: Every day | ORAL | Status: DC | PRN

## 2021-12-21 MED ORDER — TRIAMTERENE-HCTZ 37.5-25 MG PO CAPS: 1.0000 | ORAL_CAPSULE | Freq: Every morning | ORAL | Status: DC

## 2021-12-21 MED ORDER — DEXAMETHASONE SODIUM PHOSPHATE 10 MG/ML IJ SOLN
INTRAMUSCULAR | Status: DC | PRN
  Administered 2021-12-21 (×3): 5 mg via INTRAVENOUS

## 2021-12-21 MED ORDER — CEFAZOLIN SODIUM-DEXTROSE 2-3 GM-%(50ML) IV SOLR
INTRAVENOUS | Status: DC | PRN
  Administered 2021-12-21: 2 g via INTRAVENOUS

## 2021-12-21 MED ORDER — CEFAZOLIN SODIUM 1 G IJ SOLR
INTRAMUSCULAR | Status: AC
  Filled 2021-12-21: qty 10

## 2021-12-21 MED ORDER — CEFPODOXIME PROXETIL 200 MG PO TABS
200.0000 mg | ORAL_TABLET | Freq: Two times a day (BID) | ORAL | 0 refills | Status: DC
  Filled 2021-12-21: qty 56, 28d supply, fill #0

## 2021-12-21 MED ORDER — CHLORHEXIDINE GLUCONATE 0.12 % MT SOLN
OROMUCOSAL | Status: AC
  Administered 2021-12-21: 15 mL via OROMUCOSAL
  Filled 2021-12-21: qty 15

## 2021-12-21 MED ORDER — NITROGLYCERIN 0.4 MG SL SUBL: 0.4000 mg | SUBLINGUAL_TABLET | SUBLINGUAL | Status: DC | PRN

## 2021-12-21 MED ORDER — BUPIVACAINE HCL (PF) 0.25 % IJ SOLN
INTRAMUSCULAR | Status: AC
  Filled 2021-12-21: qty 30

## 2021-12-21 MED ORDER — ONDANSETRON HCL 4 MG/2ML IJ SOLN
INTRAMUSCULAR | Status: AC
  Filled 2021-12-21: qty 2

## 2021-12-21 MED ORDER — CEFPODOXIME PROXETIL 200 MG PO TABS
200.0000 mg | ORAL_TABLET | Freq: Two times a day (BID) | ORAL | 0 refills | Status: DC
  Filled 2021-12-21: qty 36, 18d supply, fill #0
  Filled 2021-12-21: qty 20, 10d supply, fill #0

## 2021-12-21 MED ORDER — HYDROCODONE-ACETAMINOPHEN 7.5-325 MG PO TABS: 1.0000 | ORAL_TABLET | ORAL | Status: DC | PRN

## 2021-12-21 SURGICAL SUPPLY — 53 items
APL PRP STRL LF DISP 70% ISPRP (MISCELLANEOUS) ×1
BAG COUNTER SPONGE SURGICOUNT (BAG) ×2 IMPLANT
BAG SPNG CNTER NS LX DISP (BAG) ×1
BNDG COHESIVE 4X5 TAN STRL (GAUZE/BANDAGES/DRESSINGS) ×2 IMPLANT
BNDG ELASTIC 4X5.8 VLCR STR LF (GAUZE/BANDAGES/DRESSINGS) ×1 IMPLANT
BNDG ELASTIC 6X5.8 VLCR STR LF (GAUZE/BANDAGES/DRESSINGS) ×1 IMPLANT
BRUSH SCRUB EZ PLAIN DRY (MISCELLANEOUS) ×4 IMPLANT
CHLORAPREP W/TINT 26 (MISCELLANEOUS) ×2 IMPLANT
COVER MAYO STAND STRL (DRAPES) ×2 IMPLANT
COVER SURGICAL LIGHT HANDLE (MISCELLANEOUS) ×4 IMPLANT
DRAPE ORTHO SPLIT 77X108 STRL (DRAPES) ×2
DRAPE SURG 17X23 STRL (DRAPES) ×2 IMPLANT
DRAPE SURG ORHT 6 SPLT 77X108 (DRAPES) ×1 IMPLANT
DRAPE U-SHAPE 47X51 STRL (DRAPES) ×2 IMPLANT
DRSG ADAPTIC 3X8 NADH LF (GAUZE/BANDAGES/DRESSINGS) ×2 IMPLANT
DRSG EMULSION OIL 3X3 NADH (GAUZE/BANDAGES/DRESSINGS) ×1 IMPLANT
DRSG MEPITEL 4X7.2 (GAUZE/BANDAGES/DRESSINGS) ×1 IMPLANT
ELECT REM PT RETURN 9FT ADLT (ELECTROSURGICAL)
ELECTRODE REM PT RTRN 9FT ADLT (ELECTROSURGICAL) IMPLANT
EVACUATOR 1/8 PVC DRAIN (DRAIN) IMPLANT
GAUZE PAD ABD 8X10 STRL (GAUZE/BANDAGES/DRESSINGS) ×2 IMPLANT
GAUZE SPONGE 4X4 12PLY STRL (GAUZE/BANDAGES/DRESSINGS) ×2 IMPLANT
GLOVE BIO SURGEON STRL SZ 6.5 (GLOVE) ×6 IMPLANT
GLOVE BIO SURGEON STRL SZ7.5 (GLOVE) ×8 IMPLANT
GLOVE BIOGEL PI IND STRL 6.5 (GLOVE) ×1 IMPLANT
GLOVE BIOGEL PI IND STRL 7.5 (GLOVE) ×1 IMPLANT
GLOVE BIOGEL PI INDICATOR 6.5 (GLOVE) ×1
GLOVE BIOGEL PI INDICATOR 7.5 (GLOVE) ×1
GOWN STRL REUS W/ TWL LRG LVL3 (GOWN DISPOSABLE) ×2 IMPLANT
GOWN STRL REUS W/TWL LRG LVL3 (GOWN DISPOSABLE) ×4
HANDPIECE INTERPULSE COAX TIP (DISPOSABLE)
KIT BASIN OR (CUSTOM PROCEDURE TRAY) ×2 IMPLANT
KIT TURNOVER KIT B (KITS) ×2 IMPLANT
MANIFOLD NEPTUNE II (INSTRUMENTS) ×2 IMPLANT
NS IRRIG 1000ML POUR BTL (IV SOLUTION) ×2 IMPLANT
PACK ORTHO EXTREMITY (CUSTOM PROCEDURE TRAY) ×2 IMPLANT
PAD ARMBOARD 7.5X6 YLW CONV (MISCELLANEOUS) ×4 IMPLANT
PAD CAST 4YDX4 CTTN HI CHSV (CAST SUPPLIES) IMPLANT
PADDING CAST COTTON 4X4 STRL (CAST SUPPLIES) ×2
PADDING CAST COTTON 6X4 STRL (CAST SUPPLIES) ×2 IMPLANT
SET HNDPC FAN SPRY TIP SCT (DISPOSABLE) IMPLANT
SPONGE T-LAP 18X18 ~~LOC~~+RFID (SPONGE) ×2 IMPLANT
SUT ETHILON 2 0 FS 18 (SUTURE) ×4 IMPLANT
SUT ETHILON 3 0 PS 1 (SUTURE) ×7 IMPLANT
SUT MON AB 2-0 CT1 36 (SUTURE) ×2 IMPLANT
SUT PDS AB 0 CT 36 (SUTURE) IMPLANT
SWAB CULTURE ESWAB REG 1ML (MISCELLANEOUS) IMPLANT
TOWEL GREEN STERILE (TOWEL DISPOSABLE) ×4 IMPLANT
TOWEL GREEN STERILE FF (TOWEL DISPOSABLE) ×2 IMPLANT
TUBE CONNECTING 12X1/4 (SUCTIONS) ×2 IMPLANT
UNDERPAD 30X36 HEAVY ABSORB (UNDERPADS AND DIAPERS) ×2 IMPLANT
WATER STERILE IRR 1000ML POUR (IV SOLUTION) ×2 IMPLANT
YANKAUER SUCT BULB TIP NO VENT (SUCTIONS) ×2 IMPLANT

## 2021-12-21 NOTE — TOC Progression Note (Signed)
Transition of Care (TOC) - Progression Note  ? ? ?Patient Details  ?Name: Gissel Keilman Michel ?MRN: [id number] ?Date of Birth: 07/18/1933 ? ?Transition of Care (TOC) CM/SW Contact  ?Marilu Favre, RN ?Phone Number: ?12/21/2021, 1:29 PM ? ?Clinical Narrative:    ? ? ?Spoke to patient and husband at bedside regarding IV ABX at home. They have done IV ABX at home before but prefer PO ABX. Husband has to return to work and cannot assist with IV ABX at home.  ? ?Discussed with ID , plan is for patient to stay through weekend on IV ABX follow cultures and discharge on PO ABX and follow up with ID  ? ? ? ?Transition of Care (TOC) Screening Note ? ? ?Patient Details  ?Name: Latorya Bautch Guthridge ?Date of Birth: 1933/05/20 ? ? ? ? ?Transition of Care Department [facility]) has reviewed patient and no TOC needs have been identified at this time. We will continue to monitor patient advancement through interdisciplinary progression rounds. If new patient transition needs arise, please place a TOC consult. ?  ?  ?  ? ?Expected Discharge Plan and Services ?  ?  ?  ?  ?  ?                ?  ?  ?  ?  ?  ?  ?  ?  ?  ?  ? ? ?Social Determinants of Health (SDOH) Interventions ?  ? ?Readmission Risk Interventions ? ?  03/16/2021  ?  2:34 PM  ?Readmission Risk Prevention Plan  ?Transportation Screening Complete  ?PCP or Specialist Appt within 5-7 Days Complete  ?Home Care Screening Complete  ?Medication Review (RN CM) Complete  ? ? ?

## 2021-12-21 NOTE — Transfer of Care (Signed)
Immediate Anesthesia Transfer of Care Note ? ?Patient: Cassandra [name] ? ?Procedure(s) Performed: IRRIGATION AND DEBRIDEMENT LEFT ANKLE (Left) ? ?Patient Location: PACU ? ?Anesthesia Type:General ? ?Level of Consciousness: awake ? ?Airway & Oxygen Therapy: Patient Spontanous Breathing ? ?Post-op Assessment: Report given to RN and Post -op Vital signs reviewed and stable ? ?Post vital signs: Reviewed and stable ? ?Last Vitals:  ?Vitals Value Taken Time  ?BP 125/95 12/21/21 0856  ?Temp    ?Pulse 85 12/21/21 0859  ?Resp 17 12/21/21 0859  ?SpO2 97 % 12/21/21 0859  ?Vitals shown include unvalidated device data. ? ?Last Pain:  ?Vitals:  ? 12/21/21 0614  ?TempSrc:   ?PainSc: 8   ?   ? ?Patients Stated Pain Goal: 0 (12/21/21 5520) ? ?Complications: No notable events documented. ?

## 2021-12-21 NOTE — Progress Notes (Addendum)
PHARMACY CONSULT NOTE FOR: ? ?OUTPATIENT  PARENTERAL ANTIBIOTIC THERAPY (OPAT) ? ?Indication: continued left ankle infection, possible osteomyelitis ?Regimen: Daptomycin 500 mg IV daily and Ceftriaxone 2g daily  ?End date: 01/04/22  ? ?After 2 weeks of IV antibiotics the patient will transition to oral Cefpodoxime 200 mg PO BID and Doxycycline 100 mg PO BID for an additional 4 weeks. Orders are sent [facility] pharmacy to be filled before the weekend. ? ?Addendum: The patient is refusing a PICC line so will plan to discharge on the oral regimen listed above and the patient will follow-up with Dr. Gale Journey outpatient to adjust the regimen pending OR cultures.  ?[name], PharmD, BCPS 12/21/2021 2:36 PM  ? ?IV antibiotic discharge orders are pended. ?To discharging provider:  please sign these orders via discharge navigator,  ?Select New Orders & click on the button choice - Manage This Unsigned Work.  ?  ?Thank you for allowing pharmacy to be a part of this patient's care. ? ?Ursula Beath ?12/21/2021, 10:05 AM ?
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
## 2021-12-21 NOTE — Anesthesia Procedure Notes (Signed)
Procedure Name: LMA Insertion ?Date/Time: 12/21/2021 7:49 AM ?Performed by: Lorie Phenix, CRNA ?Pre-anesthesia Checklist: Patient identified, Emergency Drugs available, Suction available and Patient being monitored ?Patient Re-evaluated:Patient Re-evaluated prior to induction ?Oxygen Delivery Method: Circle System Utilized ?Preoxygenation: Pre-oxygenation with 100% oxygen ?Induction Type: IV induction ?Ventilation: Mask ventilation without difficulty ?LMA: LMA inserted ?LMA Size: 4.0 ?Number of attempts: 1 ?Placement Confirmation: positive ETCO2 ?Tube secured with: Tape ?Dental Injury: Teeth and Oropharynx as per pre-operative assessment  ? ? ? ? ?

## 2021-12-21 NOTE — Anesthesia Postprocedure Evaluation (Signed)
Anesthesia Post Note ? ?Patient: Cassandra [name] ? ?Procedure(s) Performed: IRRIGATION AND DEBRIDEMENT LEFT ANKLE (Left) ? ?  ? ?Patient location during evaluation: PACU ?Anesthesia Type: General ?Level of consciousness: awake and alert ?Pain management: pain level controlled ?Vital Signs Assessment: post-procedure vital signs reviewed and stable ?Respiratory status: spontaneous breathing, nonlabored ventilation, respiratory function stable and patient connected to nasal cannula oxygen ?Cardiovascular status: blood pressure returned to baseline and stable ?Postop Assessment: no apparent nausea or vomiting ?Anesthetic complications: no ? ? ?No notable events documented. ? ?Last Vitals:  ?Vitals:  ? 12/21/21 1030 12/21/21 1045  ?BP: 114/63 (!) 104/53  ?Pulse: 73 65  ?Resp: 13 15  ?Temp: 36.5 ?C   ?SpO2: 98% 94%  ?  ?Last Pain:  ?Vitals:  ? 12/21/21 1030  ?TempSrc:   ?PainSc: Asleep  ? ? ?  ?  ?  ?  ?  ?  ? ?Uliana Brinker L Shaquanna Lycan ? ? ? ? ?

## 2021-12-21 NOTE — Interval H&P Note (Signed)
History and Physical Interval Note: ? ?12/21/2021 ?7:26 AM ? ?Cassandra [name]  has presented today for surgery, with the diagnosis of LEft ankle infection.  The various methods of treatment have been discussed with the patient and family. After consideration of risks, benefits and other options for treatment, the patient has consented to  Procedure(s): ?IRRIGATION AND DEBRIDEMENT LEFT ANKLE (Left) as a surgical intervention.  The patient's history has been reviewed, patient examined, no change in status, stable for surgery.  I have reviewed the patient's chart and labs.  Questions were answered to the patient's satisfaction.   ? ? ?Lennette Bihari P Telsa Dillavou ? ? ?
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
## 2021-12-22 ENCOUNTER — Encounter (HOSPITAL_COMMUNITY): Payer: Self-pay | Admitting: Student

## 2021-12-22 LAB — CBC
HCT: 25.4 % — ABNORMAL LOW (ref 36.0–46.0)
Hemoglobin: 8 g/dL — ABNORMAL LOW (ref 12.0–15.0)
MCH: 27.7 pg (ref 26.0–34.0)
MCHC: 31.5 g/dL (ref 30.0–36.0)
MCV: 87.9 fL (ref 80.0–100.0)
Platelets: 189 10*3/uL (ref 150–400)
RBC: 2.89 MIL/uL — ABNORMAL LOW (ref 3.87–5.11)
RDW: 18.9 % — ABNORMAL HIGH (ref 11.5–15.5)
WBC: 8.7 10*3/uL (ref 4.0–10.5)
nRBC: 0 % (ref 0.0–0.2)

## 2021-12-22 LAB — GLUCOSE, CAPILLARY
Glucose-Capillary: 101 mg/dL — ABNORMAL HIGH (ref 70–99)
Glucose-Capillary: 111 mg/dL — ABNORMAL HIGH (ref 70–99)
Glucose-Capillary: 150 mg/dL — ABNORMAL HIGH (ref 70–99)
Glucose-Capillary: 159 mg/dL — ABNORMAL HIGH (ref 70–99)
Glucose-Capillary: 201 mg/dL — ABNORMAL HIGH (ref 70–99)

## 2021-12-22 MED ORDER — LEVOTHYROXINE SODIUM 50 MCG PO TABS
50.0000 ug | ORAL_TABLET | Freq: Every day | ORAL | Status: DC
  Administered 2021-12-22 – 2021-12-23 (×2): 50 ug via ORAL
  Filled 2021-12-22 (×2): qty 1

## 2021-12-22 MED ORDER — LEVOTHYROXINE SODIUM 75 MCG PO TABS
75.0000 ug | ORAL_TABLET | Freq: Every day | ORAL | Status: DC
  Administered 2021-12-22 – 2021-12-23 (×2): 75 ug via ORAL
  Filled 2021-12-22 (×2): qty 1

## 2021-12-22 MED ORDER — WHITE PETROLATUM EX OINT
TOPICAL_OINTMENT | CUTANEOUS | Status: DC | PRN
  Filled 2021-12-22: qty 5

## 2021-12-22 MED ORDER — CLOPIDOGREL BISULFATE 75 MG PO TABS
75.0000 mg | ORAL_TABLET | Freq: Every day | ORAL | Status: DC
  Administered 2021-12-22 – 2021-12-23 (×2): 75 mg via ORAL
  Filled 2021-12-22 (×2): qty 1

## 2021-12-22 NOTE — Progress Notes (Signed)
Ortho Trauma Note ? ?Patient doing okay.  She has not been out of bed yet.  She is hoping to go home soon as possible.  She feels that she would be ready to go tomorrow.  Infectious disease has seen her.  Pain has been well controlled she did soak through her dressing overnight but was reinforced. ? ?Left lower extremity: Dressing is in place that is clean and dry.  She has active dorsiflexion plantarflexion.  Sensation is at her baseline. ? ?Labs: Hemoglobin is 8.0 this morning.  We will plan to recheck it tomorrow.  No culture growth has occurred yet. ? ?Assessment/plan: 88 year old female with chronic osteomyelitis to the distal tibia status post repeat irrigation debridement. ? ?Continue antibiotics per infectious disease.  We will plan to follow-up cultures on as an outpatient. ?Weightbearing as tolerated left lower extremity. ?Dressing change tomorrow, reinforce as needed ?Likely discharge home tomorrow. ? ?Shona Needles, MD ?[facility] ?[phone] (office) ?[email or web address] ? ? ? ?

## 2021-12-22 NOTE — Plan of Care (Signed)
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
## 2021-12-22 NOTE — Progress Notes (Signed)
Pt wanted to wait on tramadol. Retimed med.  ?

## 2021-12-22 NOTE — Discharge Instructions (Signed)
? ?[facility]uma [facility] Discharge Instructions ? ? ?General Discharge Instructions ? ?WEIGHT BEARING STATUS:Weightbearing as tolerated ? ?RANGE OF MOTION/ACTIVITY:  ? ?Wound Care: OK to remove dressing on Monday 12/24/21. Clean area with soap and water daily. Continue to change dressing to left ankle as needed.  Avoid getting ankle wet in the shower for now ? ?DVT/PE prophylaxis:  Home dose Plavix ? ?Diet: as you were eating previously.  Can use over the counter stool softeners and bowel preparations, such as Miralax, to help with bowel movements.  Narcotics can be constipating.  Be sure to drink plenty of fluids ? ?PAIN MEDICATION USE AND EXPECTATIONS ? You have likely been given narcotic medications to help control your pain.  After a traumatic event that results in an fracture (broken bone) with or without surgery, it is ok to use narcotic pain medications to help control one's pain.  We understand that everyone responds to pain differently and each individual patient will be evaluated on a regular basis for the continued need for narcotic medications. Ideally, narcotic medication use should last no more than 6-8 weeks (coinciding with fracture healing).  ? As a patient it is your responsibility as well to monitor narcotic medication use and report the amount and frequency you use these medications when you come to your office visit.  ? We would also advise that if you are using narcotic medications, you should take a dose prior to therapy to maximize you participation. ? ?IF YOU ARE ON NARCOTIC MEDICATIONS IT IS NOT PERMISSIBLE TO OPERATE A MOTOR VEHICLE (MOTORCYCLE/CAR/TRUCK/MOPED) OR HEAVY MACHINERY ?DO NOT MIX NARCOTICS WITH OTHER CNS ([place]) DEPRESSANTS SUCH AS ALCOHOL ? ? ?STOP SMOKING OR USING NICOTINE PRODUCTS!!!! ? As discussed nicotine severely impairs your body's ability to heal surgical and traumatic wounds but also impairs bone healing.  Wounds and bone heal by forming  microscopic blood vessels (angiogenesis) and nicotine is a vasoconstrictor (essentially, shrinks blood vessels).  Therefore, if vasoconstriction occurs to these microscopic blood vessels they essentially disappear and are unable to deliver necessary nutrients to the healing tissue.  This is one modifiable factor that you can do to dramatically increase your chances of healing your injury.   ? (This means no smoking, no nicotine gum, patches, etc) ? ?DO NOT USE NONSTEROIDAL ANTI-INFLAMMATORY DRUGS (NSAID'S) ? Using products such as Advil (ibuprofen), Aleve (naproxen), Motrin (ibuprofen) for additional pain control during fracture healing can delay and/or prevent the healing response.  If you would like to take over the counter (OTC) medication, Tylenol (acetaminophen) is ok.  However, some narcotic medications that are given for pain control contain acetaminophen as well. Therefore, you should not exceed more than 4000 mg of tylenol in a day if you do not have liver disease.  Also note that there are may OTC medicines, such as cold medicines and allergy medicines that my contain tylenol as well.  If you have any questions about medications and/or interactions please ask your doctor/PA or your pharmacist.  ?   ? ?ICE AND ELEVATE INJURED/OPERATIVE EXTREMITY ? Using ice and elevating the injured extremity above your heart can help with swelling and pain control.  Icing in a pulsatile fashion, such as 20 minutes on and 20 minutes off, can be followed.   ? Do not place ice directly on skin. Make sure there is a barrier between to skin and the ice pack.   ? Using frozen items such as frozen peas works well as the conform nicely to  the are that needs to be iced. ? ?USE AN ACE WRAP OR TED HOSE FOR SWELLING CONTROL ? In addition to icing and elevation, Ace wraps or TED hose are used to help limit and resolve swelling.  It is recommended to use Ace wraps or TED hose until you are informed to stop.   ? When using Ace Wraps  start the wrapping distally (farthest away from the body) and wrap proximally (closer to the body) ?  Example: If you had surgery on your leg or thing and you do not have a splint on, start the ace wrap at the toes and work your way up to the thigh ?       If you had surgery on your upper extremity and do not have a splint on, start the ace wrap at your fingers and work your way up to the upper arm ? ? ?CALL THE OFFICE WITH ANY QUESTIONS OR CONCERNS: [phone]  ? ?VISIT OUR WEBSITE FOR ADDITIONAL INFORMATION: [email or web address] ?  ? ? ?Discharge Wound Care Instructions ? ?Do NOT apply any ointments, solutions or lotions to pin sites or surgical wounds.  These prevent needed drainage and even though solutions like hydrogen peroxide kill bacteria, they also damage cells lining the pin sites that help fight infection.  Applying lotions or ointments can keep the wounds moist and can cause them to breakdown and open up as well. This can increase the risk for infection. When in doubt call the office. ? ?If any drainage is noted, use one layer of adaptic or Mepitel, then gauze, Kerlix, and an ace wrap. ?- These dressing supplies should be available at local medical supply stores [facility], [facility], etc) as well as Management consultant ([facility], [facility], Paediatric nurse, etc) ? ?Once the incision is completely dry and without drainage, it may be left open to air out.  Showering may begin 36-48 hours later.  Cleaning gently with soap and water. ?

## 2021-12-23 LAB — CBC
HCT: 24.9 % — ABNORMAL LOW (ref 36.0–46.0)
Hemoglobin: 7.7 g/dL — ABNORMAL LOW (ref 12.0–15.0)
MCH: 27.7 pg (ref 26.0–34.0)
MCHC: 30.9 g/dL (ref 30.0–36.0)
MCV: 89.6 fL (ref 80.0–100.0)
Platelets: 194 10*3/uL (ref 150–400)
RBC: 2.78 MIL/uL — ABNORMAL LOW (ref 3.87–5.11)
RDW: 19 % — ABNORMAL HIGH (ref 11.5–15.5)
WBC: 8.1 10*3/uL (ref 4.0–10.5)
nRBC: 0 % (ref 0.0–0.2)

## 2021-12-23 LAB — GLUCOSE, CAPILLARY
Glucose-Capillary: 143 mg/dL — ABNORMAL HIGH (ref 70–99)
Glucose-Capillary: 201 mg/dL — ABNORMAL HIGH (ref 70–99)
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
## 2021-12-23 NOTE — Discharge Summary (Signed)
Physician Discharge Summary  ?Patient ID: ?Cassandra [name] ?MRN: [id number] ?DOB/AGE: [birth year] 88 y.o. ? ?Admit date: 12/21/2021 ?Discharge date: 12/23/2021 ? ?Admission Diagnoses: osteomyelitis of the ankle ? ?Discharge Diagnoses:  ?Principal Problem: ?  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 ? ?Discharged Condition: good ? ?Hospital Course: Patient underwent a left ankle I&D by Dr. Doreatha Martin on 4/74/25 without complications. She spent 2 nights in the hospital for antibiotic treatment and pain management. She has mobilized well with PT and is ready for discharge home.  ? ?Consults: None ? ?Significant Diagnostic Studies: n/a ? ?Treatments: IV hydration, antibiotics: ceftriaxone, analgesia: acetaminophen, Vicodin, Morphine, and Tramadol, anticoagulation: Plavix, therapies: PT and OT, and surgery: left ankle I&D ? ?Discharge Exam: ?Blood pressure (!) 90/50, pulse 69, temperature 98.2 ?F (36.8 ?C), temperature source Oral, resp. rate 18, height '4\' 11"'$  (1.499 m), weight 61.7 kg, SpO2 97 %. ?General appearance: alert, cooperative, and no distress ?Head: Normocephalic, without obvious abnormality, atraumatic ?Resp: clear to auscultation bilaterally ?Cardio: regular rate and rhythm, S1, S2 normal, no murmur, click, rub or gallop ?Extremities: extremities normal, atraumatic, no cyanosis or edema ?Pulses:  ?L brachial 2+ R brachial 2+  ?L radial 2+ R radial 2+  ?L inguinal 2+ R inguinal 2+  ?L popliteal 2+ R popliteal 2+  ?L posterior tibial 2+ R posterior tibial 2+  ?L dorsalis pedis 2+ R dorsalis pedis 2+  ? ?Neurologic: Grossly normal ?Incision/Wound: c/d/i ? ?Disposition: Discharge disposition: 01-Home or Self Care ? ? ? ? ? ? ?Discharge Instructions   ? ? Call MD / Call 911   Complete by: As directed ?  ? If you experience chest pain or shortness of breath, CALL 911 and be transported to the hospital emergency room.  If you develope a fever above 101 F, pus (white drainage) or increased drainage or redness at the wound, or calf  pain, call your surgeon's office.  ? Diet - low sodium heart healthy   Complete by: As directed ?  ? Discharge instructions   Complete by: As directed ?  ? It is very important for you to Elevate your leg - Toes above nose as much as possible to reduce pain / swelling. ? ? ?Weight Bearing:  weight bear as tolerated affected leg. ? ?Diet: As you were doing prior to hospitalization  ? ?Shower:  keep the dressings dry with a plastic bag. ? ?Dressing:  You have a bulky dressings in place. You shouldn't need to change this more than once a day. The area should be covered at all times until office follow up.  You may loosen and re-apply ace wrap if it feels too tight.   ? ?Activity:  Increase activity slowly as tolerated, but follow the weight bearing instructions above.  The rules on driving is that you can not be taking narcotics while you drive, and you must feel in control of the vehicle.   ? ?Medicines: your antibiotic was sent to the hospital pharmacy and the rest of your medicines were sent to your preferred usual pharmacy. Take each as prescribed.  ? ? ?To prevent constipation:  ?Narcotic medicines cause constipation.  Wean these as soon as is appropriate.   ?You may use a stool softener such as - ? ?Colace (over the counter) 100 mg by mouth twice a day  ?Drink plenty of fluids (prune juice may be helpful) and high fiber foods ?Miralax (over the counter) for constipation as needed.   ? ?Itching:  If you experience itching with your  medications, try taking only a single pain pill, or even half a pain pill at a time.  You can also use benadryl over the counter for itching or also to help with sleep.  ? ?Precautions:  If you experience chest pain or shortness of breath - call 911 immediately for transfer to the hospital emergency department!! ? ?If you develop a fever greater that 101 F, purulent drainage from wound, increased redness or drainage from wound, or calf pain -- Call the office  ? Post-operative opioid  taper instructions:   Complete by: As directed ?  ? POST-OPERATIVE OPIOID TAPER INSTRUCTIONS: ?It is important to wean off of your opioid medication as soon as possible. If you do not need pain medication after your surgery it is ok to stop day one. ?Opioids include: ?Codeine, Hydrocodone(Norco, Vicodin), Oxycodone(Percocet, oxycontin) and hydromorphone amongst others.  ?Long term and even short term use of opiods can cause: ?Increased pain response ?Dependence ?Constipation ?Depression ?Respiratory depression ?And more.  ?Withdrawal symptoms can include ?Flu like symptoms ?Nausea, vomiting ?And more ?Techniques to manage these symptoms ?Hydrate well ?Eat regular healthy meals ?Stay active ?Use relaxation techniques(deep breathing, meditating, yoga) ?Do Not substitute Alcohol to help with tapering ?If you have been on opioids for less than two weeks and do not have pain than it is ok to stop all together.  ?Plan to wean off of opioids ?This plan should start within one week post op of your joint replacement. ?Maintain the same interval or time between taking each dose and first decrease the dose.  ?Cut the total daily intake of opioids by one tablet each day ?Next start to increase the time between doses. ?The last dose that should be eliminated is the evening dose.  ? ?  ? Weight bearing as tolerated   Complete by: As directed ?  ? Laterality: left  ? Extremity: Lower  ? ?  ? ?Allergies as of 12/23/2021   ? ?   Reactions  ? Levofloxacin Other (See Comments)  ? Hallucinations   ? ?  ? ?  ?Medication List  ?  ? ?STOP taking these medications   ? ?cefdinir 300 MG capsule ?Commonly known as: OMNICEF ?  ?melatonin 3 MG Tabs tablet ?  ? ?  ? ?TAKE these medications   ? ?albuterol 108 (90 Base) MCG/ACT inhaler ?Commonly known as: VENTOLIN HFA ?Inhale 2 puffs into the lungs every 6 (six) hours as needed for wheezing or shortness of breath. ?  ?cefpodoxime 200 MG tablet ?Commonly known as: VANTIN ?Take 1 tablet (200 mg  total) by mouth 2 (two) times daily for 28 days. ?  ?cholecalciferol 25 MCG (1000 UNIT) tablet ?Commonly known as: VITAMIN D ?Take 1 tablet (1,000 Units total) by mouth daily. ?  ?clopidogrel 75 MG tablet ?Commonly known as: PLAVIX ?TAKE 1 TABLET BY MOUTH DAILY ?  ?desvenlafaxine 50 MG 24 hr tablet ?Commonly known as: PRISTIQ ?Take 50 mg by mouth daily. ?  ?doxycycline 100 MG tablet ?Commonly known as: VIBRA-TABS ?Take 1 tablet (100 mg total) by mouth 2 (two) times daily for 28 days. ?  ?furosemide 20 MG tablet ?Commonly known as: LASIX ?Take 20 mg by mouth. ?  ?Lantus SoloStar 100 UNIT/ML Solostar Pen ?Generic drug: insulin glargine ?Inject 12 Units into the skin at bedtime. ?What changed: how much to take ?  ?levothyroxine 125 MCG tablet ?Commonly known as: SYNTHROID ?Take 1 tablet (125 mcg total) by mouth daily at 6 (six) AM. ?  ?metoCLOPramide 5  MG tablet ?Commonly known as: REGLAN ?Take 5 mg by mouth 3 (three) times daily before meals. ?  ?multivitamin with minerals Tabs tablet ?Take 1 tablet by mouth daily. ?  ?nitroGLYCERIN 0.4 MG SL tablet ?Commonly known as: NITROSTAT ?Place 1 tablet (0.4 mg total) under the tongue every 5 (five) minutes as needed for chest pain. ?  ?NovoLOG FlexPen 100 UNIT/ML FlexPen ?Generic drug: insulin aspart ?Inject 4-8 Units into the skin every morning. ?  ?traMADol 50 MG tablet ?Commonly known as: ULTRAM ?Take by mouth every 6 (six) hours as needed. ?  ? ?  ? ?  ?  ? ? ?  ?Discharge Care Instructions  ?(From admission, onward)  ?  ? ? ?  ? ?  Start     Ordered  ? 12/23/21 0000  Weight bearing as tolerated       ?Question Answer Comment  ?Laterality left   ?Extremity Lower   ?  ? 12/23/21 1239  ? ?  ?  ? ?  ? ? Follow-up Information   ? ? Haddix, Thomasene Lot, MD. Schedule an appointment as soon as possible for a visit in 2 week(s).   ?Specialty: Orthopedic Surgery ?Why: wound check, possible suture removal ?Contact information: ?[place]  [phone] ?[phone] ? ? ?  ?  ? ?  ?  ? ?  ? ? ?Signed: ?Britt Bottom PA-C ?12/23/2021, 12:39 PM ? ? ?

## 2021-12-23 NOTE — Progress Notes (Signed)
PIV removed. Discharge instructions completed. Patient verbalized understanding of medication regimen, follow up appointments and discharge instructions. Patient belongings gathered and packed to discharge.  

## 2021-12-23 NOTE — Progress Notes (Signed)
? ? ?Subjective: ?Patient reports pain as mild to moderate.  Tolerating diet.  Urinating.  No CP, SOB. Mobilizing well with PT/OT OOB. Eager to go home.  ? ?Objective:  ? ?VITALS:   ?Vitals:  ? 12/22/21 1557 12/22/21 1915 12/22/21 2300 12/23/21 0734  ?BP: (!) 132/46 (!) 115/42 (!) 89/47 (!) 90/50  ?Pulse: 67 88 62 69  ?Resp: '18 18 20 18  '$ ?Temp: 98 ?F (36.7 ?C) 98.2 ?F (36.8 ?C) 97.7 ?F (36.5 ?C) 98.2 ?F (36.8 ?C)  ?TempSrc: Oral Oral Oral Oral  ?SpO2: 95% 96% 99% 97%  ?Weight:      ?Height:      ? ? ?  Latest Ref Rng & Units 12/23/2021  ?  1:48 AM 12/22/2021  ?  9:27 AM 10/12/2021  ?  3:27 AM  ?CBC  ?WBC 4.0 - 10.5 K/uL 8.1   8.7   7.8    ?Hemoglobin 12.0 - 15.0 g/dL 7.7   8.0   11.0    ?Hematocrit 36.0 - 46.0 % 24.9   25.4   35.7    ?Platelets 150 - 400 K/uL 194   189   205    ? ? ?  Latest Ref Rng & Units 12/03/2021  ? 11:11 AM 10/13/2021  ?  1:15 AM 10/12/2021  ?  3:27 AM  ?BMP  ?Glucose 65 - 99 mg/dL 120   271   249    ?BUN 7 - 25 mg/dL 37   19   21    ?Creatinine 0.60 - 0.95 mg/dL 1.27   0.99   1.11    ?BUN/Creat Ratio 6 - 22 (calc) 29      ?Sodium 135 - 146 mmol/L 136   139   139    ?Potassium 3.5 - 5.3 mmol/L 4.6   3.9   4.4    ?Chloride 98 - 110 mmol/L 96   104   104    ?CO2 20 - 32 mmol/L '26   26   23    '$ ?Calcium 8.6 - 10.4 mg/dL 9.1   8.5   8.1    ? ?Intake/Output   ?   04/22 0701 ?04/23 0700 04/23 0701 ?04/24 0700  ? P.O.    ? I.V. (mL/kg) 140 (2.3) 296.7 (4.8)  ? IV Piggyback 60 200  ? Total Intake(mL/kg) 200 (3.2) 496.7 (8)  ? Urine (mL/kg/hr)    ? Blood    ? Total Output    ? Net +200 +496.7  ?     ? Urine Occurrence 3 x 2 x  ?  ? ? ?Physical Exam: ?General: NAD.  Sitting up in bedside chair, calm, comfortable ?Resp: No increased wob ?Cardio: regular rate and rhythm ?ABD soft ?Neurologically intact ?MSK ?Neurovascularly intact ?Sensation intact distally ?Intact pulses distally ?Dorsiflexion/Plantar flexion intact ?Incision: dressing changed today, was saturated with dried blood, incisions look good, Nylons  in place, new clean dressings applied ? ? ?Assessment: ?2 Days Post-Op  ?S/P Procedure(s) (LRB): ?IRRIGATION AND DEBRIDEMENT LEFT ANKLE (Left) ?by Dr. Doreatha Martin on 12/21/21 ? ?Principal Problem: ?  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 ? ?Plan: ?Continue ABX.  ?Will f/u cultures on outpatient basis. NGTD.   ?Advance diet ?Up with therapy ?Incentive Spirometry ?Elevate and Apply ice ? ?Weightbearing: WBAT LLE ?Insicional and dressing care: Reinforce dressings as needed ?Showering: Keep dressing dry ?VTE prophylaxis:  Plavix '75mg'$  daily  , SCDs, ambulation ?Pain control: continue current regimen ?Follow - up plan:  with Dr. Doreatha Martin ? ? ?Dispo:  Home today. Antibiotics sent to [facility]. Other meds sent to patient's regular pharmacy.  ? ? ? ? ?Britt Bottom, PA-C ?Office [phone] ?12/23/2021, 11:49 AM  ?

## 2021-12-26 ENCOUNTER — Telehealth: Payer: Self-pay

## 2021-12-26 ENCOUNTER — Other Ambulatory Visit (HOSPITAL_COMMUNITY): Payer: Self-pay

## 2021-12-26 ENCOUNTER — Other Ambulatory Visit: Payer: Self-pay

## 2021-12-26 ENCOUNTER — Ambulatory Visit (INDEPENDENT_AMBULATORY_CARE_PROVIDER_SITE_OTHER): Payer: Medicare Other | Admitting: Pharmacist

## 2021-12-26 ENCOUNTER — Other Ambulatory Visit (HOSPITAL_COMMUNITY): Payer: Self-pay | Admitting: Physician Assistant

## 2021-12-26 ENCOUNTER — Ambulatory Visit (INDEPENDENT_AMBULATORY_CARE_PROVIDER_SITE_OTHER): Payer: Medicare Other | Admitting: Internal Medicine

## 2021-12-26 ENCOUNTER — Encounter: Payer: Self-pay | Admitting: Internal Medicine

## 2021-12-26 VITALS — HR 76 | Temp 97.4°F | Ht 59.0 in | Wt 135.0 lb

## 2021-12-26 DIAGNOSIS — A318 Other mycobacterial infections: Secondary | ICD-10-CM

## 2021-12-26 DIAGNOSIS — E119 Type 2 diabetes mellitus without complications: Secondary | ICD-10-CM

## 2021-12-26 DIAGNOSIS — M869 Osteomyelitis, unspecified: Secondary | ICD-10-CM
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
## 2021-12-26 NOTE — Telephone Encounter (Signed)
[facility] Patient Advocate Encounter ? ?Prior Authorization for Elesa Hacker has been approved.   ? ?PA# [id number] ?Effective dates: 12/26/21 through 01/09/22 ? ?Patients co-pay is $1534.77.  ? ?[facility] will continue to follow. ? ?Ileene Patrick, CPhT ?Specialty Pharmacy Patient Advocate ?[facility] ?Phone: [phone] ?Fax:  [phone]  ?

## 2021-12-26 NOTE — Patient Instructions (Signed)
Picc will be ordered for you by [facility] ? ? ?Stop your cefpodoxime and doxycycline ? ? ?Our pharmacist will discuss with you medication regimen for your mycobacterial abscessus treatment ?Imipenem iv ?Cefoxitin iv ?Omadocycline pill ?Azithromycin pill ? ? ?Will see you in 4 weeks to see if any medication needs to be changed based on side effect and antibiotics susceptibility testing ? ? ?

## 2021-12-26 NOTE — Telephone Encounter (Signed)
Called and spoke with patient to provide update on PICC appointment tomorrow. ? ?Patient stated she received a call 30 minutes ago regarding insurance authorization.  ? ?This RN has not yet received confirmation from [facility] of insurance status or whether short stay is needed. Communicated that to patient, and explained that the PICC appointment might need to be rescheduled and that triage would follow up as needed. Patient stated understanding and had no other questions. ? ?Binnie Kand, RN  ?

## 2021-12-26 NOTE — Telephone Encounter (Signed)
Called [facility] and spoke with Kayla. Lonn Georgia confirmed receipt of Epic community message, detailing new OPAT orders from Dr. Gale Journey for IV imipenem and cefoxitin, for duration of 12 weeks. Lonn [place] stated she would run patient's insurance and follow up with additional information. ? ?Patient is scheduled for PICC placement at [facility] tomorrow (4/27) at 2pm. Patient aware of appointment. ?
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
## 2021-12-26 NOTE — Telephone Encounter (Signed)
[facility] Patient Advocate Encounter ?  ?Received notification from [employer] Medicare Part D that prior authorization for Elesa Hacker is required. ?  ?PA submitted on 12/26/2021 ?Key [id number] ?Status is pending ?   ?[facility] will continue to follow. ? ? ?Ileene Patrick, CPhT ?Specialty Pharmacy Patient Advocate ?[facility] ?Phone: [phone] ?Fax:  [phone]  ?

## 2021-12-27 ENCOUNTER — Telehealth: Payer: Self-pay | Admitting: Student-PharmD

## 2021-12-27 ENCOUNTER — Ambulatory Visit (HOSPITAL_COMMUNITY)
Admission: RE | Admit: 2021-12-27 | Discharge: 2021-12-27 | Disposition: A | Discharge status: Home / Self Care | Payer: Medicare Other | Source: Ambulatory Visit | Attending: Internal Medicine | Admitting: Internal Medicine

## 2021-12-27 ENCOUNTER — Other Ambulatory Visit: Payer: Self-pay | Admitting: Pharmacist

## 2021-12-27 DIAGNOSIS — A318 Other mycobacterial infections: Secondary | ICD-10-CM | POA: Diagnosis not present

## 2021-12-27 DIAGNOSIS — M869 Osteomyelitis, unspecified: Secondary | ICD-10-CM | POA: Diagnosis not present

## 2021-12-27 HISTORY — PX: IR US GUIDE VASC ACCESS LEFT: IMG2389

## 2021-12-27 HISTORY — PX: IR FLUORO GUIDE CV LINE LEFT: IMG2282

## 2021-12-27 LAB — AEROBIC/ANAEROBIC CULTURE W GRAM STAIN (SURGICAL/DEEP WOUND)

## 2021-12-27 MED ORDER — HEPARIN SOD (PORK) LOCK FLUSH 100 UNIT/ML IV SOLN
INTRAVENOUS | Status: AC
  Filled 2021-12-27: qty 5

## 2021-12-27 MED ORDER — LIDOCAINE HCL 1 % IJ SOLN
INTRAMUSCULAR | Status: DC | PRN
  Administered 2021-12-27: 10 mL via INTRADERMAL

## 2021-12-27 MED ORDER — AZITHROMYCIN 500 MG PO TABS: 500.0000 mg | ORAL_TABLET | Freq: Every day | ORAL | 5 refills | Status: DC

## 2021-12-27 MED ORDER — LIDOCAINE HCL 1 % IJ SOLN
INTRAMUSCULAR | Status: AC
  Filled 2021-12-27: qty 20

## 2021-12-27 MED ORDER — HEPARIN SOD (PORK) LOCK FLUSH 100 UNIT/ML IV SOLN
INTRAVENOUS | Status: DC | PRN
  Administered 2021-12-27: 500 [IU] via INTRAVENOUS

## 2021-12-27 NOTE — Telephone Encounter (Signed)
Working with Carolynn Sayers at [facility] to see if it's possible for patient to do first dose at home. Awaiting response.  ? ?Beryle Flock, RN ? ?

## 2021-12-27 NOTE — Procedures (Signed)
Interventional Radiology Procedure: ? ? ?Indications: Left lower extremity osteomyelitis ? ?Procedure: Tunneled central line placement ? ?Findings: Right jugular Powerline, dual lumen, 20 cm, tip at SVC/RA junction ? ?Complications: No immediate complications noted. ?    ?EBL: Minimal ? ?Plan: Central line is ready to use.  ? ? ?Antuan Limes R. Anselm Pancoast, MD  ?Pager: [phone] ? ? ? ?  ?

## 2021-12-27 NOTE — Telephone Encounter (Signed)
Contacted patient to inform her that [place] PA was approved by her insurance through 01/09/22 with a copay of $1500. Told patient that due to the copay, Dr. Gale Journey will be switching therapy to clofazimine instead. Discussed with patient that she will not have a copay with this medication. Asked patient to come in after her PICC line insertion before 4:30pm to pick up medication and for [place] to counsel on it. Patient  agreeable to plan and says she will come right after her appointment today.  ? ?Marlowe Alt, PharmD Candidate ?12/27/2021 10:59 AM ? ?

## 2021-12-27 NOTE — Telephone Encounter (Signed)
Spoke with Jeannene Patella, patient will start all antibiotics in home on Saturday 4/29 - okay per Dr. Gale Journey.  ? ?Patient's daughter does not want her to be on continuous pump for cefoxitin, pharmacy will dose this every 8 hours. Dr. Gale Journey notified.  ? ?Beryle Flock, RN ? ?

## 2021-12-28 ENCOUNTER — Other Ambulatory Visit: Payer: Self-pay | Admitting: Internal Medicine

## 2021-12-28 ENCOUNTER — Telehealth: Payer: Self-pay | Admitting: Student-PharmD

## 2021-12-28 ENCOUNTER — Encounter (HOSPITAL_COMMUNITY): Payer: Self-pay | Admitting: Radiology

## 2021-12-28 DIAGNOSIS — A318 Other mycobacterial infections: Secondary | ICD-10-CM

## 2021-12-28 DIAGNOSIS — M869 Osteomyelitis, unspecified: Secondary | ICD-10-CM

## 2021-12-28 MED ORDER — AMBULATORY NON FORMULARY MEDICATION: 100.0000 mg | Freq: Every day | 1 refills | Status: DC

## 2021-12-28 NOTE — Telephone Encounter (Signed)
Spoke to patient and patient's husband to counsel on starting clofazimine. Patient picked up 2 bottles of clofazimine yesterday (4/27). Patient took first dose this morning.  ? ?Counseled patient to take 2 capsules by mouth once daily with meals. Counseled to not open capsules and to swallow whole. Counseled patient of side effects she may experience such as skin discoloration (browning), dry, itchy skin, upset stomach, and increased sensitivity to sun. Counseled patient to use moisturizer if she experiences any dry/itchy skin and to wear sunscreen/hat/long sleeves when going in the sun. Patient and patient's husband verbalized understanding.  ? ?Notified patient that azithromycin prescription has been sent to their home pharmacy, [facility], and they can pick it up and start it tomorrow. Counseled that they can take clofazimine at the same time as azithromycin if they want to.  ? ?Told patient that she will pick up clofazimine refills at our clinic and to call when she is halfway through 2nd bottle. Told patient to call our clinic if any issues/questions. ? ?Patient agreeable to plan above. ? ?Marlowe Alt, PharmD Candidate ?12/28/2021 9:56 AM ? ?

## 2021-12-31 ENCOUNTER — Encounter (INDEPENDENT_AMBULATORY_CARE_PROVIDER_SITE_OTHER): Payer: Medicare Other

## 2021-12-31 ENCOUNTER — Ambulatory Visit (INDEPENDENT_AMBULATORY_CARE_PROVIDER_SITE_OTHER): Payer: Medicare Other | Admitting: Vascular Surgery

## 2022-01-11 LAB — MISC LABCORP TEST (SEND OUT): Labcorp test code: 182915

## 2022-01-14 ENCOUNTER — Telehealth: Payer: Self-pay

## 2022-01-14 LAB — AEROBIC/ANAEROBIC CULTURE W GRAM STAIN (SURGICAL/DEEP WOUND)

## 2022-01-14 NOTE — Telephone Encounter (Signed)
Thanks for your help with her, Junie Panning!

## 2022-01-14 NOTE — Telephone Encounter (Signed)
Routed to provider

## 2022-01-14 NOTE — Telephone Encounter (Signed)
Called and spoke with patient. Per Dr. Gale Journey, told patient to stop all medications, and scheduled patient for appointment on Wednesday afternoon 5/17. Patient stated understanding and agreement. ? ?Patient stated that she has felt better since taking benadryl. Denied difficulty breathing. Stated itching and rash have improved. This RN reiterated to patient if she experiences any difficulty breathing to seek care at ER. Patient stated understanding. ? ?Will call and coordinate with [facility]. ? ?Binnie Kand, RN  ?

## 2022-01-14 NOTE — Telephone Encounter (Signed)
Thank you :)

## 2022-01-14 NOTE — Telephone Encounter (Signed)
Called and spoke with Roderic Ovens at [facility]. Relayed order from Dr. Gale Journey to stop medications at this time. Communicated that patient was scheduled for an appointment on 5/17 in the afternoon, and to anticipate follow up at that time. Roderic Ovens verbalized understanding and repeated the instructions. No additional questions at this time. ? ?Binnie Kand, RN  ?

## 2022-01-14 NOTE — Telephone Encounter (Signed)
Patient's spouse [name] called stating patient is experiencing itching all over body for the past two days, lips are swollen, and rash present on legs. Denied difficulty breathing. States patient discontinued PO azithromycin two days ago due to symptoms. The patient began using OTC benadryl topical ointment and spray, and took generic liquid OTC benadryl PO (3 doses since yesterday). States some relief. ? ?Requesting advice if there is anything else patient can take to relieve itching. Counseled that if patient begins to have difficulty breathing, should seek care at ED. Stated understanding. ? ?Will route to clinical pharmacist. ? ?Binnie Kand, RN  ?

## 2022-01-16 ENCOUNTER — Ambulatory Visit (INDEPENDENT_AMBULATORY_CARE_PROVIDER_SITE_OTHER): Payer: Medicare Other | Admitting: Internal Medicine

## 2022-01-16 ENCOUNTER — Encounter: Payer: Self-pay | Admitting: Internal Medicine

## 2022-01-16 ENCOUNTER — Telehealth: Payer: Self-pay

## 2022-01-16 ENCOUNTER — Other Ambulatory Visit: Payer: Self-pay

## 2022-01-16 ENCOUNTER — Other Ambulatory Visit (HOSPITAL_COMMUNITY): Payer: Self-pay

## 2022-01-16 VITALS — BP 139/55 | HR 72 | Temp 98.1°F | Ht 59.0 in | Wt 137.0 lb

## 2022-01-16 DIAGNOSIS — A318 Other mycobacterial infections: Secondary | ICD-10-CM | POA: Diagnosis not present

## 2022-01-16 DIAGNOSIS — R7982 Elevated C-reactive protein (CRP): Secondary | ICD-10-CM

## 2022-01-16 DIAGNOSIS — T7840XA Allergy, unspecified, initial encounter: Secondary | ICD-10-CM | POA: Diagnosis not present

## 2022-01-16 MED ORDER — HYDROXYZINE PAMOATE 25 MG PO CAPS: 25.0000 mg | ORAL_CAPSULE | Freq: Three times a day (TID) | ORAL | 0 refills | Status: DC | PRN

## 2022-01-16 NOTE — Patient Instructions (Signed)
Try hydroxizine for itch prn ? ?Stop oral benadryl ? ? ?Stop all antibiotics including iv ? ? ?Wait until your symptoms resolve (I will give this 1 to 2 weeks) -- see me in 2 weeks to start rechallenging you with the antibiotics ? ? ?We discussed some potential labs false reading with the culture. I'll investigate this and make sure that you really need antibiotics. ? ? ?Blood tests today ? ? ? ?

## 2022-01-16 NOTE — Telephone Encounter (Signed)
Sent Epic community message to [facility] and copied [facility] clinical pharmacists. ? ?Per verbal orders from Dr. Gale Journey, patient needs to hold all antibiotics (both oral and IV). Continue routine PICC care. Patient is scheduled for appointment on 02/04/22; please anticipate additional follow up at that time. Patient aware. ? ?Binnie Kand, RN  ?
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
## 2022-01-17 LAB — COMPLETE METABOLIC PANEL WITH GFR
AG Ratio: 1 (calc) (ref 1.0–2.5)
ALT: 7 U/L (ref 6–29)
AST: 14 U/L (ref 10–35)
Albumin: 2.8 g/dL — ABNORMAL LOW (ref 3.6–5.1)
Alkaline phosphatase (APISO): 81 U/L (ref 37–153)
BUN: 12 mg/dL (ref 7–25)
CO2: 25 mmol/L (ref 20–32)
Calcium: 8.2 mg/dL — ABNORMAL LOW (ref 8.6–10.4)
Chloride: 105 mmol/L (ref 98–110)
Creat: 0.79 mg/dL (ref 0.60–0.95)
Globulin: 2.9 g/dL (calc) (ref 1.9–3.7)
Glucose, Bld: 103 mg/dL — ABNORMAL HIGH (ref 65–99)
Potassium: 3.7 mmol/L (ref 3.5–5.3)
Sodium: 140 mmol/L (ref 135–146)
Total Bilirubin: 0.3 mg/dL (ref 0.2–1.2)
Total Protein: 5.7 g/dL — ABNORMAL LOW (ref 6.1–8.1)
eGFR: 72 mL/min/{1.73_m2} (ref >=60)

## 2022-01-17 LAB — CBC
HCT: 29 % — ABNORMAL LOW (ref 35.0–45.0)
Hemoglobin: 9 g/dL — ABNORMAL LOW (ref 11.7–15.5)
MCH: 26.3 pg — ABNORMAL LOW (ref 27.0–33.0)
MCHC: 31 g/dL — ABNORMAL LOW (ref 32.0–36.0)
MCV: 84.8 fL (ref 80.0–100.0)
MPV: 9.6 fL (ref 7.5–12.5)
Platelets: 272 10*3/uL (ref 140–400)
RBC: 3.42 10*6/uL — ABNORMAL LOW (ref 3.80–5.10)
RDW: 15.4 % — ABNORMAL HIGH (ref 11.0–15.0)
WBC: 10.3 10*3/uL (ref 3.8–10.8)

## 2022-01-17 LAB — C-REACTIVE PROTEIN: CRP: 52.5 mg/L — ABNORMAL HIGH (ref <8.0)

## 2022-01-22 ENCOUNTER — Ambulatory Visit: Payer: Medicare Other

## 2022-01-22 ENCOUNTER — Ambulatory Visit: Admission: RE | Admit: 2022-01-22 | Payer: Medicare Other | Source: Ambulatory Visit

## 2022-01-29 ENCOUNTER — Inpatient Hospital Stay (HOSPITAL_COMMUNITY)
Admission: AD | Admit: 2022-01-29 | Discharge: 2022-02-01 | DRG: 638 | Disposition: A | Discharge status: Home / Self Care | Payer: Medicare Other | Source: Ambulatory Visit | Attending: Family Medicine | Admitting: Family Medicine

## 2022-01-29 ENCOUNTER — Ambulatory Visit (INDEPENDENT_AMBULATORY_CARE_PROVIDER_SITE_OTHER): Payer: Medicare Other | Admitting: Internal Medicine

## 2022-01-29 ENCOUNTER — Ambulatory Visit: Payer: Medicare Other | Admitting: Internal Medicine

## 2022-01-29 ENCOUNTER — Other Ambulatory Visit: Payer: Self-pay

## 2022-01-29 ENCOUNTER — Inpatient Hospital Stay (HOSPITAL_COMMUNITY): Payer: Medicare Other

## 2022-01-29 VITALS — BP 105/69 | HR 62 | Temp 97.7°F

## 2022-01-29 DIAGNOSIS — S99912A Unspecified injury of left ankle, initial encounter: Secondary | ICD-10-CM | POA: Diagnosis present

## 2022-01-29 DIAGNOSIS — A318 Other mycobacterial infections: Secondary | ICD-10-CM

## 2022-01-29 DIAGNOSIS — Z66 Do not resuscitate: Secondary | ICD-10-CM | POA: Diagnosis present

## 2022-01-29 DIAGNOSIS — E785 Hyperlipidemia, unspecified: Secondary | ICD-10-CM | POA: Diagnosis present

## 2022-01-29 DIAGNOSIS — Z85828 Personal history of other malignant neoplasm of skin: Secondary | ICD-10-CM | POA: Diagnosis not present

## 2022-01-29 DIAGNOSIS — E039 Hypothyroidism, unspecified: Secondary | ICD-10-CM | POA: Diagnosis present

## 2022-01-29 DIAGNOSIS — M86662 Other chronic osteomyelitis, left tibia and fibula: Secondary | ICD-10-CM | POA: Diagnosis present

## 2022-01-29 DIAGNOSIS — Z79899 Other long term (current) drug therapy: Secondary | ICD-10-CM | POA: Diagnosis not present

## 2022-01-29 DIAGNOSIS — Z881 Allergy status to other antibiotic agents status: Secondary | ICD-10-CM

## 2022-01-29 DIAGNOSIS — Z7984 Long term (current) use of oral hypoglycemic drugs: Secondary | ICD-10-CM | POA: Diagnosis not present

## 2022-01-29 DIAGNOSIS — M7989 Other specified soft tissue disorders: Secondary | ICD-10-CM

## 2022-01-29 DIAGNOSIS — E119 Type 2 diabetes mellitus without complications: Secondary | ICD-10-CM | POA: Diagnosis not present

## 2022-01-29 DIAGNOSIS — Z794 Long term (current) use of insulin: Secondary | ICD-10-CM

## 2022-01-29 DIAGNOSIS — A319 Mycobacterial infection, unspecified: Secondary | ICD-10-CM | POA: Diagnosis not present

## 2022-01-29 DIAGNOSIS — B9689 Other specified bacterial agents as the cause of diseases classified elsewhere: Secondary | ICD-10-CM | POA: Diagnosis present

## 2022-01-29 DIAGNOSIS — Z950 Presence of cardiac pacemaker: Secondary | ICD-10-CM | POA: Diagnosis not present

## 2022-01-29 DIAGNOSIS — I495 Sick sinus syndrome: Secondary | ICD-10-CM | POA: Diagnosis present

## 2022-01-29 DIAGNOSIS — Z79890 Hormone replacement therapy (postmenopausal): Secondary | ICD-10-CM

## 2022-01-29 DIAGNOSIS — I2510 Atherosclerotic heart disease of native coronary artery without angina pectoris: Secondary | ICD-10-CM

## 2022-01-29 DIAGNOSIS — M869 Osteomyelitis, unspecified: Secondary | ICD-10-CM | POA: Diagnosis present

## 2022-01-29 DIAGNOSIS — G2581 Restless legs syndrome: Secondary | ICD-10-CM | POA: Diagnosis present

## 2022-01-29 DIAGNOSIS — Z853 Personal history of malignant neoplasm of breast: Secondary | ICD-10-CM

## 2022-01-29 DIAGNOSIS — E1151 Type 2 diabetes mellitus with diabetic peripheral angiopathy without gangrene: Secondary | ICD-10-CM | POA: Diagnosis present

## 2022-01-29 DIAGNOSIS — I252 Old myocardial infarction: Secondary | ICD-10-CM

## 2022-01-29 DIAGNOSIS — Z981 Arthrodesis status: Secondary | ICD-10-CM | POA: Diagnosis not present

## 2022-01-29 DIAGNOSIS — K551 Chronic vascular disorders of intestine: Secondary | ICD-10-CM | POA: Diagnosis present

## 2022-01-29 DIAGNOSIS — I739 Peripheral vascular disease, unspecified: Secondary | ICD-10-CM

## 2022-01-29 DIAGNOSIS — J449 Chronic obstructive pulmonary disease, unspecified: Secondary | ICD-10-CM

## 2022-01-29 DIAGNOSIS — E1165 Type 2 diabetes mellitus with hyperglycemia: Secondary | ICD-10-CM | POA: Diagnosis present

## 2022-01-29 DIAGNOSIS — G4733 Obstructive sleep apnea (adult) (pediatric): Secondary | ICD-10-CM | POA: Diagnosis present

## 2022-01-29 DIAGNOSIS — I10 Essential (primary) hypertension: Secondary | ICD-10-CM | POA: Diagnosis present

## 2022-01-29 DIAGNOSIS — Z7902 Long term (current) use of antithrombotics/antiplatelets: Secondary | ICD-10-CM

## 2022-01-29 DIAGNOSIS — E1169 Type 2 diabetes mellitus with other specified complication: Secondary | ICD-10-CM | POA: Diagnosis present

## 2022-01-29 DIAGNOSIS — R609 Edema, unspecified: Secondary | ICD-10-CM | POA: Diagnosis not present

## 2022-01-29 DIAGNOSIS — L089 Local infection of the skin and subcutaneous tissue, unspecified: Secondary | ICD-10-CM

## 2022-01-29 DIAGNOSIS — E118 Type 2 diabetes mellitus with unspecified complications: Secondary | ICD-10-CM | POA: Diagnosis present

## 2022-01-29 LAB — BASIC METABOLIC PANEL
Anion gap: 7 (ref 5–15)
BUN: 11 mg/dL (ref 8–23)
CO2: 27 mmol/L (ref 22–32)
Calcium: 8.3 mg/dL — ABNORMAL LOW (ref 8.9–10.3)
Chloride: 106 mmol/L (ref 98–111)
Creatinine, Ser: 1.22 mg/dL — ABNORMAL HIGH (ref 0.44–1.00)
GFR, Estimated: 43 mL/min — ABNORMAL LOW (ref >60)
Glucose, Bld: 126 mg/dL — ABNORMAL HIGH (ref 70–99)
Potassium: 3.8 mmol/L (ref 3.5–5.1)
Sodium: 140 mmol/L (ref 135–145)

## 2022-01-29 LAB — CBC
HCT: 29.1 % — ABNORMAL LOW (ref 36.0–46.0)
Hemoglobin: 9 g/dL — ABNORMAL LOW (ref 12.0–15.0)
MCH: 26.2 pg (ref 26.0–34.0)
MCHC: 30.9 g/dL (ref 30.0–36.0)
MCV: 84.6 fL (ref 80.0–100.0)
Platelets: 300 10*3/uL (ref 150–400)
RBC: 3.44 MIL/uL — ABNORMAL LOW (ref 3.87–5.11)
RDW: 17.5 % — ABNORMAL HIGH (ref 11.5–15.5)
WBC: 8.7 10*3/uL (ref 4.0–10.5)
nRBC: 0 % (ref 0.0–0.2)

## 2022-01-29 LAB — HEMOGLOBIN A1C
Hgb A1c MFr Bld: 6.6 % — ABNORMAL HIGH (ref 4.8–5.6)
Mean Plasma Glucose: 142.72 mg/dL

## 2022-01-29 LAB — C-REACTIVE PROTEIN: CRP: 0.6 mg/dL (ref <1.0)

## 2022-01-29 LAB — GLUCOSE, CAPILLARY: Glucose-Capillary: 126 mg/dL — ABNORMAL HIGH (ref 70–99)

## 2022-01-29 LAB — SEDIMENTATION RATE: Sed Rate: 79 mm/hr — ABNORMAL HIGH (ref 0–22)

## 2022-01-29 MED ORDER — SODIUM CHLORIDE 0.9 % IV SOLN: INTRAVENOUS | Status: DC | PRN

## 2022-01-29 MED ORDER — SODIUM CHLORIDE 0.9% FLUSH: 10.0000 mL | INTRAVENOUS | Status: DC | PRN

## 2022-01-29 MED ORDER — INSULIN ASPART 100 UNIT/ML IJ SOLN: 0.0000 [IU] | Freq: Three times a day (TID) | INTRAMUSCULAR | Status: DC

## 2022-01-29 MED ORDER — CHLORHEXIDINE GLUCONATE CLOTH 2 % EX PADS
6.0000 | MEDICATED_PAD | Freq: Every day | CUTANEOUS | Status: DC
  Administered 2022-01-30 – 2022-02-01 (×3): 6 via TOPICAL

## 2022-01-29 MED ORDER — INSULIN ASPART 100 UNIT/ML IJ SOLN
0.0000 [IU] | Freq: Every day | INTRAMUSCULAR | Status: DC
  Administered 2022-01-30: 2 [IU] via SUBCUTANEOUS

## 2022-01-29 MED ORDER — ACETAMINOPHEN 325 MG PO TABS
650.0000 mg | ORAL_TABLET | Freq: Once | ORAL | Status: AC
  Administered 2022-01-29: 650 mg via ORAL
  Filled 2022-01-29: qty 2

## 2022-01-29 MED ORDER — DEXTROSE 5 % IV SOLN
950.0000 mg | INTRAVENOUS | Status: DC
  Administered 2022-01-29 – 2022-01-31 (×2): 950 mg via INTRAVENOUS
  Filled 2022-01-29 (×3): qty 3.8

## 2022-01-29 MED ORDER — MELATONIN 5 MG PO TABS
5.0000 mg | ORAL_TABLET | Freq: Once | ORAL | Status: AC
Start: 2022-01-29 — End: 2022-01-29
  Administered 2022-01-29: 5 mg via ORAL
  Filled 2022-01-29: qty 1

## 2022-01-29 NOTE — Progress Notes (Signed)
Pharmacy Antibiotic Note  Cassandra [name] is a 88 y.o. female admitted on 01/29/2022 with  mycobacterium abscessus osteomyelitis .  Pharmacy has been consulted for amikacin dosing.  Pt was a direct admit from the ID clinic today for amikacin initiation for her M abcessus infection. She had a recent allergy to one of her abx. The plan is for TIW amikacin follow by cefoxitin, linezolid, and clofazimine. She is being admitted just for amikacin initiation.   Scr 0.79 on 5/17  Plan: Amikacin '950mg'$  TTSat Monitor scr    Temp (24hrs), Avg:97.6 F (36.4 C), Min:97.5 F (36.4 C), Max:97.7 F (36.5 C)  No results for input(s): WBC, CREATININE, LATICACIDVEN, VANCOTROUGH, VANCOPEAK, VANCORANDOM, GENTTROUGH, GENTPEAK, GENTRANDOM, TOBRATROUGH, TOBRAPEAK, TOBRARND, AMIKACINPEAK, AMIKACINTROU, AMIKACIN in the last 168 hours.  Estimated Creatinine Clearance: 39.7 mL/min (by C-G formula based on SCr of 0.79 mg/dL).    Allergies  Allergen Reactions   Levofloxacin Other (See Comments)    Hallucinations     Antimicrobials this admission:   Dose adjustments this admission:   Microbiology results:  Onnie Boer, PharmD, BCIDP, AAHIVP, CPP Infectious Disease Pharmacist 01/29/2022 8:59 PM

## 2022-01-29 NOTE — Patient Instructions (Addendum)
Will restart 4 medications again for your infection    Amikacin IV Cefoxitin IV Clofazamine PO Linezolid PO     See me again in 4-6 weeks   Will plan to admit you today or tomorrow to start amikacin treatment

## 2022-01-29 NOTE — Progress Notes (Addendum)
Met with patient today to discuss plans. She will transition to IV amikacin, IV cefoxitin, clofazimine PO, and linezolid PO. She will be admitted to hospital for amikacin initiation - dose should be 950 mg three times weekly. She will see me in a week to re-initiate cefoxitin treatment. We will then start clofazimine one week after that and linezolid one week after that. Will send in linezolid Rx when the time comes as she gets her medications blister packed and we do not want her to start that until she has been on the other antibiotics x 1 week. Will follow plan with patient.   Rolin Schult L. Eber Hong, PharmD, BCIDP, AAHIVP, CPP Clinical Pharmacist Practitioner Infectious Diseases [place] 01/29/2022, 12:16 PM   ------------ Addendum I called [facility] admitting service Dr Lorin Mercy who accepted patient. I advise her to come today as tomorrow there might not be a bed
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
## 2022-01-30 ENCOUNTER — Other Ambulatory Visit: Payer: Self-pay

## 2022-01-30 ENCOUNTER — Encounter (HOSPITAL_COMMUNITY): Payer: Self-pay | Admitting: Internal Medicine

## 2022-01-30 ENCOUNTER — Inpatient Hospital Stay (HOSPITAL_COMMUNITY): Payer: Medicare Other

## 2022-01-30 ENCOUNTER — Other Ambulatory Visit (HOSPITAL_COMMUNITY): Payer: Self-pay

## 2022-01-30 DIAGNOSIS — E119 Type 2 diabetes mellitus without complications: Secondary | ICD-10-CM | POA: Diagnosis not present

## 2022-01-30 DIAGNOSIS — I739 Peripheral vascular disease, unspecified: Secondary | ICD-10-CM

## 2022-01-30 DIAGNOSIS — M869 Osteomyelitis, unspecified: Secondary | ICD-10-CM | POA: Diagnosis not present

## 2022-01-30 DIAGNOSIS — L089 Local infection of the skin and subcutaneous tissue, unspecified: Secondary | ICD-10-CM

## 2022-01-30 DIAGNOSIS — A319 Mycobacterial infection, unspecified: Secondary | ICD-10-CM

## 2022-01-30 DIAGNOSIS — A318 Other mycobacterial infections: Secondary | ICD-10-CM

## 2022-01-30 DIAGNOSIS — R609 Edema, unspecified: Secondary | ICD-10-CM

## 2022-01-30 DIAGNOSIS — M7989 Other specified soft tissue disorders: Secondary | ICD-10-CM

## 2022-01-30 LAB — COMPLETE METABOLIC PANEL WITH GFR
AG Ratio: 1 (calc) (ref 1.0–2.5)
ALT: 5 U/L — ABNORMAL LOW (ref 6–29)
AST: 13 U/L (ref 10–35)
Albumin: 3.6 g/dL (ref 3.6–5.1)
Alkaline phosphatase (APISO): 72 U/L (ref 37–153)
BUN/Creatinine Ratio: 11 (calc) (ref 6–22)
BUN: 12 mg/dL (ref 7–25)
CO2: 25 mmol/L (ref 20–32)
Calcium: 9.4 mg/dL (ref 8.6–10.4)
Chloride: 107 mmol/L (ref 98–110)
Creat: 1.14 mg/dL — ABNORMAL HIGH (ref 0.60–0.95)
Globulin: 3.5 g/dL (calc) (ref 1.9–3.7)
Glucose, Bld: 115 mg/dL — ABNORMAL HIGH (ref 65–99)
Potassium: 4.7 mmol/L (ref 3.5–5.3)
Sodium: 143 mmol/L (ref 135–146)
Total Bilirubin: 0.3 mg/dL (ref 0.2–1.2)
Total Protein: 7.1 g/dL (ref 6.1–8.1)
eGFR: 47 mL/min/{1.73_m2} — ABNORMAL LOW (ref >=60)

## 2022-01-30 LAB — C-REACTIVE PROTEIN: CRP: 2.4 mg/L (ref <8.0)

## 2022-01-30 LAB — GLUCOSE, CAPILLARY
Glucose-Capillary: 121 mg/dL — ABNORMAL HIGH (ref 70–99)
Glucose-Capillary: 212 mg/dL — ABNORMAL HIGH (ref 70–99)
Glucose-Capillary: 266 mg/dL — ABNORMAL HIGH (ref 70–99)
Glucose-Capillary: 220 mg/dL — ABNORMAL HIGH (ref 70–99)

## 2022-01-30 MED ORDER — INSULIN GLARGINE-YFGN 100 UNIT/ML ~~LOC~~ SOLN
10.0000 [IU] | Freq: Every day | SUBCUTANEOUS | Status: DC
  Administered 2022-01-30 – 2022-01-31 (×2): 10 [IU] via SUBCUTANEOUS
  Filled 2022-01-30 (×3): qty 0.1

## 2022-01-30 MED ORDER — VENLAFAXINE HCL ER 75 MG PO CP24
75.0000 mg | ORAL_CAPSULE | Freq: Every day | ORAL | Status: DC
  Administered 2022-01-31 – 2022-02-01 (×2): 75 mg via ORAL
  Filled 2022-01-30 (×2): qty 1

## 2022-01-30 MED ORDER — LEVOTHYROXINE SODIUM 100 MCG PO TABS
100.0000 ug | ORAL_TABLET | Freq: Every day | ORAL | Status: DC
  Administered 2022-01-31 – 2022-02-01 (×2): 100 ug via ORAL
  Filled 2022-01-30 (×2): qty 1

## 2022-01-30 MED ORDER — FUROSEMIDE 20 MG PO TABS: 20.0000 mg | ORAL_TABLET | Freq: Every day | ORAL | Status: DC | PRN

## 2022-01-30 MED ORDER — CLOPIDOGREL BISULFATE 75 MG PO TABS
75.0000 mg | ORAL_TABLET | Freq: Every day | ORAL | Status: DC
Start: 2022-01-30 — End: 2022-02-01
  Administered 2022-01-30 – 2022-02-01 (×3): 75 mg via ORAL
  Filled 2022-01-30 (×3): qty 1

## 2022-01-30 MED ORDER — GABAPENTIN 100 MG PO CAPS
100.0000 mg | ORAL_CAPSULE | Freq: Three times a day (TID) | ORAL | Status: DC
  Administered 2022-01-30 – 2022-02-01 (×6): 100 mg via ORAL
  Filled 2022-01-30 (×6): qty 1

## 2022-01-30 MED ORDER — ENOXAPARIN SODIUM 30 MG/0.3ML IJ SOSY
30.0000 mg | PREFILLED_SYRINGE | INTRAMUSCULAR | Status: DC
Start: 2022-01-30 — End: 2022-01-31
  Administered 2022-01-30: 30 mg via SUBCUTANEOUS
  Filled 2022-01-30: qty 0.3
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
## 2022-01-30 NOTE — Assessment & Plan Note (Signed)
Negative [place] for dvt Follow complex cystic structure in popliteal fossa outpatient
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
## 2022-01-30 NOTE — Progress Notes (Signed)
Left lower extremity venous duplex completed. Refer to "CV Proc" under chart review to view preliminary results.  01/30/2022 2:52 PM Kelby Aline., MHA, RVT, RDCS, RDMS

## 2022-01-30 NOTE — Progress Notes (Signed)
Patient refused CPAP at this time.

## 2022-01-30 NOTE — Assessment & Plan Note (Signed)
asx EKG with paced rhythm

## 2022-01-30 NOTE — Assessment & Plan Note (Signed)
S/p stenting of SMA 11/2019 plavix
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
## 2022-01-30 NOTE — Assessment & Plan Note (Signed)
Resp status at baseline

## 2022-01-30 NOTE — Assessment & Plan Note (Signed)
Continue synthroid.

## 2022-01-30 NOTE — Progress Notes (Signed)
Pt refusing cpap for the night. ?
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
## 2022-01-30 NOTE — Assessment & Plan Note (Signed)
SSI Basal insulin Holding metformin

## 2022-01-31 DIAGNOSIS — M869 Osteomyelitis, unspecified: Secondary | ICD-10-CM | POA: Diagnosis not present

## 2022-01-31 LAB — COMPREHENSIVE METABOLIC PANEL
ALT: 8 U/L (ref 0–44)
AST: 14 U/L — ABNORMAL LOW (ref 15–41)
Albumin: 2.7 g/dL — ABNORMAL LOW (ref 3.5–5.0)
Alkaline Phosphatase: 59 U/L (ref 38–126)
Anion gap: 5 (ref 5–15)
BUN: 12 mg/dL (ref 8–23)
CO2: 26 mmol/L (ref 22–32)
Calcium: 8.8 mg/dL — ABNORMAL LOW (ref 8.9–10.3)
Chloride: 111 mmol/L (ref 98–111)
Creatinine, Ser: 0.92 mg/dL (ref 0.44–1.00)
GFR, Estimated: >60 mL/min (ref >60)
Glucose, Bld: 136 mg/dL — ABNORMAL HIGH (ref 70–99)
Potassium: 4.1 mmol/L (ref 3.5–5.1)
Sodium: 142 mmol/L (ref 135–145)
Total Bilirubin: 0.3 mg/dL (ref 0.3–1.2)
Total Protein: 6.2 g/dL — ABNORMAL LOW (ref 6.5–8.1)

## 2022-01-31 LAB — CBC WITH DIFFERENTIAL/PLATELET
Abs Immature Granulocytes: 0.03 10*3/uL (ref 0.00–0.07)
Basophils Absolute: 0.1 10*3/uL (ref 0.0–0.1)
Basophils Relative: 2 %
Eosinophils Absolute: 0.7 10*3/uL — ABNORMAL HIGH (ref 0.0–0.5)
Eosinophils Relative: 10 %
HCT: 31.2 % — ABNORMAL LOW (ref 36.0–46.0)
Hemoglobin: 9.8 g/dL — ABNORMAL LOW (ref 12.0–15.0)
Immature Granulocytes: 0 %
Lymphocytes Relative: 28 %
Lymphs Abs: 1.9 10*3/uL (ref 0.7–4.0)
MCH: 26.1 pg (ref 26.0–34.0)
MCHC: 31.4 g/dL (ref 30.0–36.0)
MCV: 83 fL (ref 80.0–100.0)
Monocytes Absolute: 0.7 10*3/uL (ref 0.1–1.0)
Monocytes Relative: 10 %
Neutro Abs: 3.5 10*3/uL (ref 1.7–7.7)
Neutrophils Relative %: 50 %
Platelets: 291 10*3/uL (ref 150–400)
RBC: 3.76 MIL/uL — ABNORMAL LOW (ref 3.87–5.11)
RDW: 17.2 % — ABNORMAL HIGH (ref 11.5–15.5)
WBC: 7 10*3/uL (ref 4.0–10.5)
nRBC: 0 % (ref 0.0–0.2)

## 2022-01-31 LAB — GLUCOSE, CAPILLARY
Glucose-Capillary: 154 mg/dL — ABNORMAL HIGH (ref 70–99)
Glucose-Capillary: 175 mg/dL — ABNORMAL HIGH (ref 70–99)
Glucose-Capillary: 166 mg/dL — ABNORMAL HIGH (ref 70–99)
Glucose-Capillary: 182 mg/dL — ABNORMAL HIGH (ref 70–99)

## 2022-01-31 LAB — MAGNESIUM: Magnesium: 1.9 mg/dL (ref 1.7–2.4)

## 2022-01-31 LAB — PHOSPHORUS: Phosphorus: 3.5 mg/dL (ref 2.5–4.6)

## 2022-01-31 MED ORDER — ALTEPLASE 2 MG IJ SOLR
2.0000 mg | Freq: Once | INTRAMUSCULAR | Status: AC
  Administered 2022-01-31: 2 mg
  Filled 2022-01-31: qty 2

## 2022-01-31 MED ORDER — ENOXAPARIN SODIUM 40 MG/0.4ML IJ SOSY
40.0000 mg | PREFILLED_SYRINGE | INTRAMUSCULAR | Status: DC
  Administered 2022-01-31: 40 mg via SUBCUTANEOUS
  Filled 2022-01-31: qty 0.4

## 2022-01-31 MED ORDER — MELATONIN 5 MG PO TABS
5.0000 mg | ORAL_TABLET | Freq: Once | ORAL | Status: AC
  Administered 2022-01-31: 5 mg via ORAL
  Filled 2022-01-31: qty 1

## 2022-01-31 NOTE — TOC Progression Note (Signed)
Transition of Care [facility]) - Progression Note    Patient Details  Name: NEKESHIA LENHARDT MRN: [id number] Date of Birth: [birth year]  Transition of Care [facility]) CM/SW Contact  Jacalyn Lefevre Edson Snowball, RN Phone Number: 01/31/2022, 10:40 AM  Clinical Narrative:     In progression MD reported anticipated discharge date tomorrow. Messaged Pam with [facility]   Expected Discharge Plan: [place] Barriers to Discharge: Continued Medical Work up  Expected Discharge Plan and Services Expected Discharge Plan: [place]   Discharge Planning Services: CM Consult Post Acute Care Choice: [place] arrangements for the past 2 months: Single Family Home                                       Social Determinants of Health (SDOH) Interventions    Readmission Risk Interventions    03/16/2021    2:34 PM  Readmission Risk Prevention Plan  Transportation Screening Complete  PCP or Specialist Appt within 5-7 Days Complete  Home Care Screening Complete  Medication Review (RN CM) Complete

## 2022-01-31 NOTE — Progress Notes (Addendum)
Inpatient Diabetes Program Recommendations  AACE/ADA: New Consensus Statement on Inpatient Glycemic Control (2015)  Target Ranges:  Prepandial:   less than 140 mg/dL      Peak postprandial:   less than 180 mg/dL (1-2 hours)      Critically ill patients:  140 - 180 mg/dL   Lab Results  Component Value Date   GLUCAP 154 (H) 01/31/2022   HGBA1C 6.6 (H) 01/29/2022    Review of Glycemic Control  Latest Reference Range & Units 01/30/22 08:43 01/30/22 12:38 01/30/22 16:32 01/30/22 21:16 01/31/22 08:37  Glucose-Capillary 70 - 99 mg/dL 121 (H) 212 (H) 266 (H) 220 (H) 154 (H)  (H): Data is abnormally high  Diabetes history: DM2 Outpatient Diabetes medications: Lantus 10 units QHS, Novolog 12 units TID, Metformin 500 mg BID Current orders for Inpatient glycemic control: Semglee 10 units QHS, Novolog 0-15 units TID & 0-5 units QHS  Inpatient Diabetes Program Recommendations:    Novolog 3 units TID with meals if consumes at least 50%.  Noted she refused correction this morning for CBG of 154 mg/dL.    Will continue to follow while inpatient.  Thank you, Reche Dixon, MSN, [place] Diabetes Coordinator Inpatient Diabetes Program [phone] (team pager from 8a-5p)

## 2022-01-31 NOTE — Progress Notes (Signed)
Patient refused CPAP HS tonight 
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
## 2022-02-01 DIAGNOSIS — M869 Osteomyelitis, unspecified: Secondary | ICD-10-CM | POA: Diagnosis not present

## 2022-02-01 DIAGNOSIS — A319 Mycobacterial infection, unspecified: Secondary | ICD-10-CM | POA: Diagnosis not present

## 2022-02-01 DIAGNOSIS — E119 Type 2 diabetes mellitus without complications: Secondary | ICD-10-CM | POA: Diagnosis not present

## 2022-02-01 LAB — CBC WITH DIFFERENTIAL/PLATELET
Abs Immature Granulocytes: 0.02 10*3/uL (ref 0.00–0.07)
Basophils Absolute: 0.1 10*3/uL (ref 0.0–0.1)
Basophils Relative: 2 %
Eosinophils Absolute: 1.1 10*3/uL — ABNORMAL HIGH (ref 0.0–0.5)
Eosinophils Relative: 15 %
HCT: 29.6 % — ABNORMAL LOW (ref 36.0–46.0)
Hemoglobin: 9 g/dL — ABNORMAL LOW (ref 12.0–15.0)
Immature Granulocytes: 0 %
Lymphocytes Relative: 31 %
Lymphs Abs: 2.2 10*3/uL (ref 0.7–4.0)
MCH: 25.6 pg — ABNORMAL LOW (ref 26.0–34.0)
MCHC: 30.4 g/dL (ref 30.0–36.0)
MCV: 84.3 fL (ref 80.0–100.0)
Monocytes Absolute: 0.8 10*3/uL (ref 0.1–1.0)
Monocytes Relative: 12 %
Neutro Abs: 2.9 10*3/uL (ref 1.7–7.7)
Neutrophils Relative %: 40 %
Platelets: 260 10*3/uL (ref 150–400)
RBC: 3.51 MIL/uL — ABNORMAL LOW (ref 3.87–5.11)
RDW: 17.2 % — ABNORMAL HIGH (ref 11.5–15.5)
WBC: 7.1 10*3/uL (ref 4.0–10.5)
nRBC: 0 % (ref 0.0–0.2)

## 2022-02-01 LAB — COMPREHENSIVE METABOLIC PANEL
ALT: 6 U/L (ref 0–44)
AST: 12 U/L — ABNORMAL LOW (ref 15–41)
Albumin: 2.5 g/dL — ABNORMAL LOW (ref 3.5–5.0)
Alkaline Phosphatase: 55 U/L (ref 38–126)
Anion gap: 5 (ref 5–15)
BUN: 12 mg/dL (ref 8–23)
CO2: 25 mmol/L (ref 22–32)
Calcium: 8.4 mg/dL — ABNORMAL LOW (ref 8.9–10.3)
Chloride: 109 mmol/L (ref 98–111)
Creatinine, Ser: 1.1 mg/dL — ABNORMAL HIGH (ref 0.44–1.00)
GFR, Estimated: 49 mL/min — ABNORMAL LOW (ref >60)
Glucose, Bld: 127 mg/dL — ABNORMAL HIGH (ref 70–99)
Potassium: 3.6 mmol/L (ref 3.5–5.1)
Sodium: 139 mmol/L (ref 135–145)
Total Bilirubin: 0.1 mg/dL — ABNORMAL LOW (ref 0.3–1.2)
Total Protein: 5.7 g/dL — ABNORMAL LOW (ref 6.5–8.1)

## 2022-02-01 LAB — GLUCOSE, CAPILLARY
Glucose-Capillary: 243 mg/dL — ABNORMAL HIGH (ref 70–99)
Glucose-Capillary: 153 mg/dL — ABNORMAL HIGH (ref 70–99)

## 2022-02-01 LAB — PHOSPHORUS: Phosphorus: 3.8 mg/dL (ref 2.5–4.6)

## 2022-02-01 LAB — MAGNESIUM: Magnesium: 1.9 mg/dL (ref 1.7–2.4)

## 2022-02-01 MED ORDER — AMIKACIN IV (FOR PTA / DISCHARGE USE ONLY): 950.0000 mg | INTRAVENOUS | 0 refills | Status: AC

## 2022-02-01 NOTE — Progress Notes (Signed)
[place] for Infectious Disease  Date of Admission:  01/29/2022             ASSESSMENT:  Ms. Babineaux has been tolerating the amikacin with no significant adverse side effects. Renal function has remained stable and no changes in hearing. Discussed plan of care to continue current dose of amikacin three times weekly (T, Th, Sat). Has follow up scheduled in [facility] clinic on 02/05/22. OPAT orders placed below. [facility] for discharge from [place]. Remaining medical and supportive care per primary team.   PLAN:  Continue current dose of amikacin initially through 04/05/22 Monitor renal function and hearing.  OPAT orders placed. Follow up on 6/6 with [facility] clinic for next medication. [place] for discharge from ID standpoint. Remaining medical and supportive care per primary team.   Diagnosis:  Mycobacterium Abscesses Osteomyelitis   Culture Result: Mycobacterium abscesses   Allergies  Allergen Reactions   Levofloxacin Other (See Comments)    Hallucinations     OPAT Orders Discharge antibiotics to be given via PICC line Discharge antibiotics: Amikacin Per pharmacy protocol Duration: 3 months  End Date: 04/25/22  [facility] Care Per Protocol:  Home health RN for IV administration and teaching; PICC line care and labs.    Labs weekly while on IV antibiotics: _X_ CBC with differential _X_ BMP (Twice Weekly) __ CMP _X_ CRP _X_ ESR __ Vancomycin trough __ CK  __ Please pull PIC at completion of IV antibiotics _X_ Please leave PIC in place until doctor has seen patient or been notified  Fax weekly labs to [phone]  Clinic Follow Up Appt:  02/05/22 with Magda Kiel, PharmD, CPP   Principal Problem:   Osteomyelitis ([place]) Active Problems:   Mycobacterium abscessus infection   Acquired hypothyroidism   COPD (chronic obstructive pulmonary disease) ([place])   CAD (coronary artery disease)   Type II diabetes mellitus with manifestations ([place])   Sick sinus  syndrome ([place])   Hyperlipidemia associated with type 2 diabetes mellitus ([place])   Mesenteric artery stenosis (HCC)   Wound infection   Left leg swelling   PVD (peripheral vascular disease) (HCC)    Chlorhexidine Gluconate Cloth  6 each Topical Daily   clopidogrel  75 mg Oral Daily   enoxaparin (LOVENOX) injection  40 mg Subcutaneous Q24H   gabapentin  100 mg Oral TID   insulin aspart  0-15 Units Subcutaneous TID WC   insulin aspart  0-5 Units Subcutaneous QHS   insulin glargine-yfgn  10 Units Subcutaneous QHS   levothyroxine  100 mcg Oral Q0600   venlafaxine XR  75 mg Oral Q breakfast    SUBJECTIVE:  Afebrile overnight with no acute events. No new concerns/complaints.   Allergies  Allergen Reactions   Levofloxacin Other (See Comments)    Hallucinations      Review of Systems: Review of Systems  Constitutional:  Negative for chills, fever and weight loss.  Respiratory:  Negative for cough, shortness of breath and wheezing.   Cardiovascular:  Negative for chest pain and leg swelling.  Gastrointestinal:  Negative for abdominal pain, constipation, diarrhea, nausea and vomiting.  Skin:  Negative for rash.     OBJECTIVE: Vitals:   01/31/22 1613 01/31/22 2101 02/01/22 0521 02/01/22 0808  BP: 124/69 (!) 177/67 (!) 154/71 129/71  Pulse: 68 65 63 65  Resp: _0 Temp: (!) 97.5 F (36.4 C) 98 F (36.7 C) 97.9 F (36.6 C) 97.8 F (36.6 C)  TempSrc: Oral Oral Oral  SpO2: 96% 93% 94% 97%  Weight:      Height:       Body mass index is 30.01 kg/m.  Physical Exam Constitutional:      General: She is not in acute distress.    Appearance: She is well-developed.  Cardiovascular:     Rate and Rhythm: Normal rate and regular rhythm.     Heart sounds: Normal heart sounds.  Pulmonary:     Effort: Pulmonary effort is normal.     Breath sounds: Normal breath sounds.  Skin:    General: Skin is warm and dry.  Neurological:     Mental Status: She is alert and  oriented to person, place, and time.  Psychiatric:        Behavior: Behavior normal.        Thought Content: Thought content normal.        Judgment: Judgment normal.    Lab Results Lab Results  Component Value Date   WBC 7.1 02/01/2022   HGB 9.0 (L) 02/01/2022   HCT 29.6 (L) 02/01/2022   MCV 84.3 02/01/2022   PLT 260 02/01/2022    Lab Results  Component Value Date   CREATININE 1.10 (H) 02/01/2022   BUN 12 02/01/2022   NA 139 02/01/2022   K 3.6 02/01/2022   CL 109 02/01/2022   CO2 25 02/01/2022    Lab Results  Component Value Date   ALT 6 02/01/2022   AST 12 (L) 02/01/2022   ALKPHOS 55 02/01/2022   BILITOT 0.1 (L) 02/01/2022     Microbiology: No results found for this or any previous visit (from the past 240 hour(s)).   Terri Piedra, NP [place] for Infectious Disease [facility]  02/01/2022  12:07 PM

## 2022-02-01 NOTE — TOC Progression Note (Signed)
Transition of Care [facility]) - Progression Note    Patient Details  Name: FATMA RUTTEN MRN: [id number] Date of Birth: 1933-02-06  [facility] CM/SW Contact  Jacalyn Lefevre Edson Snowball, RN Phone Number: 02/01/2022, 11:37 AM  Clinical Narrative:     Anticipated discharge date today. Asked MD to sign OPAT script. Pam with [employer] aware   Expected Discharge Plan: [place] Barriers to Discharge: Continued Medical Work up  Expected Discharge Plan and Services Expected Discharge Plan: [place]   Discharge Planning Services: CM Consult Post Acute Care Choice: [place] arrangements for the past 2 months: Single Family Home                                       Social Determinants of Health (SDOH) Interventions    Readmission Risk Interventions    03/16/2021    2:34 PM  Readmission Risk Prevention Plan  Transportation Screening Complete  PCP or Specialist Appt within 5-7 Days Complete  Home Care Screening Complete  Medication Review (RN CM) Complete

## 2022-02-01 NOTE — Care Management Important Message (Signed)
Important Message  Patient Details  Name: Cassandra [name] MRN: [id number] Date of Birth: Jan 14, 1933   Medicare Important Message Given:  Yes     Khyrie Masi 02/01/2022, 3:02 PM

## 2022-02-01 NOTE — Progress Notes (Signed)
PHARMACY CONSULT NOTE FOR:  OUTPATIENT  PARENTERAL ANTIBIOTIC THERAPY (OPAT)  Indication: Mycobacterium abscessus osteomyelitis Regimen: Amikacin 950 mg every  TTHSa  End date: 04/05/22  Patient will likely need a long course of therapy. She will follow-up in the ID clinic to decide on definitive length of therapy.   IV antibiotic discharge orders are pended. To discharging provider:  please sign these orders via discharge navigator,  Select New Orders & click on the button choice - Manage This Unsigned Work.     Thank you for allowing pharmacy to be a part of this patient's care.  Jimmy Footman, PharmD, BCPS, BCIDP Infectious Diseases Clinical Pharmacist Phone: [phone] 02/01/2022, 8:29 AM

## 2022-02-01 NOTE — Plan of Care (Signed)
  Problem: Clinical Measurements: Goal: Will remain free from infection Outcome: Not Progressing   Problem: Activity: Goal: Risk for activity intolerance will decrease Outcome: Not Progressing   Problem: Pain Managment: Goal: General experience of comfort will improve Outcome: Not Progressing   Problem: Safety: Goal: Ability to remain free from injury will improve Outcome: Not Progressing
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
## 2022-02-04 ENCOUNTER — Ambulatory Visit: Payer: Medicare Other | Admitting: Internal Medicine

## 2022-02-05 ENCOUNTER — Ambulatory Visit (INDEPENDENT_AMBULATORY_CARE_PROVIDER_SITE_OTHER): Payer: Medicare Other | Admitting: Pharmacist

## 2022-02-05 ENCOUNTER — Other Ambulatory Visit: Payer: Self-pay

## 2022-02-05 DIAGNOSIS — A318 Other mycobacterial infections: Secondary | ICD-10-CM

## 2022-02-05 NOTE — Progress Notes (Signed)
Virtual Visit via Telephone Note  I connected with Cassandra [name] on 02/05/22 at 10:00 AM EDT by telephone and verified that I am speaking with the correct person using two identifiers. I was able to speak, to Cassandra [name]'s husband, Cassandra [name] today.   Location: Patient: Home Provider: [facility]   I discussed the limitations, risks, security and privacy concerns of performing an evaluation and management service by telephone and the availability of in person appointments. I also discussed with the patient that there may be a patient responsible charge related to this service. The patient expressed understanding and agreed to proceed.  I called Cassandra [name] today to discuss her antibiotic regimen for Mycobacterium abscessus osteomyelitis. I was able to speak with her husband, Cassandra [name] as Cassandra [name] was taking a nap at the time. She was started on Amikacin IV inpatient last week. She is tolerating this well without any issues. Denies any ototoxicity symptoms of hearing loss or  changes in balance. Husband reports that she is still going to the bathroom regularly.  I discussed with that we will be starting cefoxitin 2g IV q8h. This medication can cause a false elevation in creatinine when drawing labs if drawn within 2 hours of medication administration. I discussed with patient to hold her dose if she knows that lab draws will need to be done prior to the nurse getting to her home. It needs to be at least 3 hours after a dose is given to represent the most accurate labs. Cassandra [name] also stated that the home health RN also told them about holding doses prior to lab draws.  I spoke with home health to correlate plan and lab draws. We will send in prescription for Cefoxitin 2g IV q8h. Follow-up telehealth appointment made for 02/12/22 @ 1:45. We will continue to monitor for adverse effects and on clofazamine next week and then linezolid 1 week after that.    Follow Up Instructions:    I discussed the assessment and treatment plan with the  patient. The patient was provided an opportunity to ask questions and all were answered. The patient agreed with the plan and demonstrated an understanding of the instructions.   The patient was advised to call back or seek an in-person evaluation if the symptoms worsen or if the condition fails to improve as anticipated.   I provided 15 minutes of non-face-to-face time during this encounter.  Lestine Box, PharmD PGY2 Infectious Diseases Pharmacy Resident

## 2022-02-05 NOTE — Progress Notes (Signed)
Provider order:    Will initiate cefoxitin. Plan is to do 3 months at least of this if she tolerate She will remain on tiw amikacin  Subsequent abx to initiate if she tolerates cefoxitin without allergy reaction 1) clofazimin 2) linezolid (to start 1 week after clofazimin)   Diagnosis: Left lower ext OM/soft tissue infection m abscessus (R azith) Allergy rash to one of the azith, clofaz, meropenem, cefoxitin    Allergies  Allergen Reactions   Levofloxacin Other (See Comments)    Hallucinations     OPAT Orders Discharge antibiotics to be given via PICC line Discharge antibiotics: (already on amikicin tiw); new meds on this order cefoxitin 2 gram iv q8hours  Duration: 3 months End Date: 05/08/2022  [facility] Care Per Protocol:  Home health RN for IV administration and teaching; PICC line care and labs.    Labs weekly while on IV antibiotics: _x_ CBC with differential __ BMP _x_ CMP _x_ CRP __ ESR __ Vancomycin trough __ CK  _x_ AG trough    __ Please pull PIC at completion of IV antibiotics __ Please leave PIC in place until doctor has seen patient or been notified  Fax weekly labs to [phone]

## 2022-02-12 ENCOUNTER — Other Ambulatory Visit: Payer: Self-pay

## 2022-02-12 ENCOUNTER — Ambulatory Visit (INDEPENDENT_AMBULATORY_CARE_PROVIDER_SITE_OTHER): Payer: Medicare Other | Admitting: Pharmacist

## 2022-02-12 DIAGNOSIS — M869 Osteomyelitis, unspecified: Secondary | ICD-10-CM

## 2022-02-12 DIAGNOSIS — A318 Other mycobacterial infections: Secondary | ICD-10-CM | POA: Diagnosis not present

## 2022-02-12 NOTE — Progress Notes (Signed)
Virtual Visit via Telephone Note  I connected with Cassandra [name] on 02/12/22 at  1:45 PM EDT by telephone and verified that I am speaking with the correct person using two identifiers.  Location: Patient: Cassandra [name] Provider: [facility]   I discussed the limitations, risks, security and privacy concerns of performing an evaluation and management service by telephone and the availability of in person appointments. I also discussed with the patient that there may be a patient responsible charge related to this service. The patient expressed understanding and agreed to proceed.  Assessment and Plan: I called Cassandra [name] today to discuss her antibiotic regimen for Mycobacterium abscessus osteomyelitis. I was able to speak with Cassandra [name] and her husband, Cassandra [name]. Over the last several weeks we have been starting a new antibiotic each week. We began with IV amikacin followed by IV cefoxitin. She has been tolerating both of these medications well without any issues. Denies any ototoxicity symptoms of hearing loss or changes in balance. Denies any rashes or skin changes.  Today we will be initiating Clofazimine 100 mg by mouth daily. I counseled patient to take 2 capsules by mouth daily with a meal. Counseled not to open capsules and to take whole. We discussed some of the side effects that she may experience such as brown discoloring of bodily fluids and skin, increased sun sensitivity, and GI upset. She said that she moisturizes daily and will continue to do while on this medication. If outdoors will need to have sun protection on as well. Cassandra [name] expressed understanding.   I also checked that she still has 2 bottles of Clofazimine at home. She confirmed that she has the bottles and would begin taking. I had to clarify with her multiple times of the plan as she was thinking she may need to go to the pharmacy and was previously told to stop taking this medication. I explained that we stopped the medication when she  was having a rash and are adding back on the drugs slowly to have a better understanding of what drug is causing the reaction. She verifies understanding and we made a follow-up in one week where we will then begin linezolid.   I discussed the assessment and treatment plan with the patient. The patient was provided an opportunity to ask questions and all were answered. The patient agreed with the plan and demonstrated an understanding of the instructions.  Telehealth phone call scheduled for 02/19/22 @ 2:30.   The patient was advised to call back or seek an in-person evaluation if the symptoms worsen or if the condition fails to improve as anticipated.  I provided 20 minutes of non-face-to-face time during this encounter.  Lestine Box, PharmD PGY2 Infectious Diseases Pharmacy Resident

## 2022-02-19 ENCOUNTER — Other Ambulatory Visit: Payer: Self-pay

## 2022-02-19 ENCOUNTER — Ambulatory Visit (INDEPENDENT_AMBULATORY_CARE_PROVIDER_SITE_OTHER): Payer: Medicare Other | Admitting: Pharmacist

## 2022-02-19 DIAGNOSIS — A318 Other mycobacterial infections: Secondary | ICD-10-CM | POA: Diagnosis not present

## 2022-02-19 MED ORDER — LINEZOLID 600 MG PO TABS: 600.0000 mg | ORAL_TABLET | Freq: Two times a day (BID) | ORAL | 3 refills | Status: DC

## 2022-02-19 NOTE — Progress Notes (Signed)
Virtual Visit via Telephone Note  I connected with Cassandra [name] on 02/19/22 at  2:30 PM EDT by telephone and verified that I am speaking with the correct person using two identifiers.  Location: Patient: Cassandra [name] Provider: [facility]  I discussed the limitations, risks, security and privacy concerns of performing an evaluation and management service by telephone and the availability of in person appointments. I also discussed with the patient that there may be a patient responsible charge related to this service. The patient expressed understanding and agreed to proceed.  Assessment and Plan: I called Cassandra [name] today to discuss her antibiotic regimen for Mycobacterium abscessus osteomyelitis. I have talked to [facility] several times over the last few weeks as we have started a new medication each week. We began with IV amikacin followed by IV cefoxitin. Last week we started clofazimine 100 mg by mouth daily. She reports the she is tolerating all of the medications without any issues or concerns. She has some mild discoloration of the skin, but it is not dry or itchy. Denies any ototoxicity symptoms of hearing loss or changes in balance. Denies any rashes or intolerances to medications.   Today we will be initiating Linezolid 600 mg by mouth twice daily. I counseled her to take 1 tablet  by mouth twice daily. She confirmed that she was going to take at morning and nighttime. We discussed that she may experience upset stomach or headaches, but overall well tolerated. She displayed understanding and all questions and concerns answered today.   She will see Dr. Gale Journey on 03/07/22 @ 11:30 Will send in prescription to [place]  I discussed the assessment and treatment plan with the patient. The patient was provided an opportunity to ask questions and all were answered. The patient agreed with the plan and demonstrated an understanding of the instructions.   The patient was advised to call back or  seek an in-person evaluation if the symptoms worsen or if the condition fails to improve as anticipated.  I provided 10 minutes of non-face-to-face time during this encounter.  Lestine Box, PharmD PGY2 Infectious Diseases Pharmacy Resident

## 2022-02-25 ENCOUNTER — Telehealth: Payer: Self-pay | Admitting: Pharmacist

## 2022-02-28 ENCOUNTER — Telehealth: Payer: Self-pay

## 2022-02-28 NOTE — Telephone Encounter (Signed)
Discussed case with Dr. Gale Journey. We both believe the reaction is due to the linezolid as she has been sequentially restarting amikacin, cefoxitin, and clofazimine without any complications. She started linezolid last Tuesday and only two days later experienced these new symptoms again. Will stop linezolid today and continue other 3 antibiotics for now pending more input from [employer]  Alfonse Spruce, PharmD, CPP Clinical Pharmacist Practitioner Infectious [place] for Infectious Disease

## 2022-02-28 NOTE — Telephone Encounter (Signed)
Thank you for your help as always Cece!

## 2022-02-28 NOTE — Telephone Encounter (Signed)
Amikacin trough was collected on 6/26 and resulted today within normal range at <0.8 ug/mL.  Alfonse Spruce, PharmD, CPP Clinical Pharmacist Practitioner [facility] [place] for Infect[facility] Disease

## 2022-02-28 NOTE — Telephone Encounter (Signed)
Patient called office stating she is experiencing a burning sensation around her mouth and tongue. Is not able to eat due to feeling like her tongue is burning. States is similar to eating a jalapeno. This has been happening for 5-3 days now.  Would like to know if provider could send is some type of rinse to help ease this sensation. Also if provider had any recommendations.  Denies any issues swallowing.  Leatrice Jewels, RMA

## 2022-02-28 NOTE — Telephone Encounter (Signed)
Spoke with patient and her husband regarding call from this morning. Advised that they stop taking the Linezolid now. Informed them provider would discuss alternative options at upcoming appointment.  Verbalized understanding. Will continue to take her other antibiotics as prescribed.  Leatrice Jewels, RMA

## 2022-03-07 ENCOUNTER — Telehealth: Payer: Self-pay | Admitting: Pharmacist

## 2022-03-07 ENCOUNTER — Other Ambulatory Visit: Payer: Self-pay

## 2022-03-07 ENCOUNTER — Ambulatory Visit (INDEPENDENT_AMBULATORY_CARE_PROVIDER_SITE_OTHER): Payer: Medicare Other | Admitting: Internal Medicine

## 2022-03-07 ENCOUNTER — Encounter: Payer: Self-pay | Admitting: Internal Medicine

## 2022-03-07 VITALS — BP 131/71 | HR 126 | Resp 16 | Ht 59.0 in | Wt 151.2 lb

## 2022-03-07 DIAGNOSIS — A319 Mycobacterial infection, unspecified: Secondary | ICD-10-CM | POA: Diagnosis not present

## 2022-03-07 DIAGNOSIS — M869 Osteomyelitis, unspecified: Secondary | ICD-10-CM | POA: Diagnosis not present

## 2022-03-07 NOTE — Patient Instructions (Signed)
No change in plan.[name]  Awaiting different input on the 4th antibiotics   Will do video visit next week

## 2022-03-07 NOTE — Telephone Encounter (Signed)
Patient's amikacin trough resulted high yesterday at 4; Scr remains stable ~0.9-1.1. Previously 4 on 6/21 but < 1 again on 6/26. Likely due to collection error with nursing staff checking trough right after infusion or during infusion; will recheck on Saturday (earliest [facility] can send someone) and follow-up on repeat results next week.  Alfonse Spruce, PharmD, CPP Clinical Pharmacist Practitioner Infect[facility] [place] for Infectious Disease
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
## 2022-03-13 ENCOUNTER — Other Ambulatory Visit (HOSPITAL_COMMUNITY): Payer: Self-pay

## 2022-03-14 ENCOUNTER — Telehealth: Payer: Self-pay

## 2022-03-14 ENCOUNTER — Telehealth: Payer: Self-pay | Admitting: Pharmacist

## 2022-03-14 ENCOUNTER — Other Ambulatory Visit (HOSPITAL_COMMUNITY): Payer: Self-pay

## 2022-03-14 ENCOUNTER — Ambulatory Visit (INDEPENDENT_AMBULATORY_CARE_PROVIDER_SITE_OTHER): Payer: Medicare Other | Admitting: Internal Medicine

## 2022-03-14 ENCOUNTER — Other Ambulatory Visit: Payer: Self-pay

## 2022-03-14 DIAGNOSIS — M869 Osteomyelitis, unspecified: Secondary | ICD-10-CM

## 2022-03-14 DIAGNOSIS — A318 Other mycobacterial infections: Secondary | ICD-10-CM

## 2022-03-14 NOTE — Telephone Encounter (Signed)
I had [name] check again just to be sure and PO omadacycline is even more expensive than previous check. Just FYI.

## 2022-03-14 NOTE — Telephone Encounter (Signed)
[facility] Patient Advocate Encounter  Prior Authorization for Elesa Hacker has been approved.    PA# [id number] Effective dates: 03/14/22 through 03/28/22  Patients co-pay is $1920.89.   There is no Patient Assistance for Medicare Part d Patients.  [facility] will continue to follow.  Ileene Patrick, [facility] Patient [facility] for Infectious Disease Phone: [phone] Fax:  [phone]

## 2022-03-14 NOTE — Addendum Note (Signed)
Addended byPrudencio Pair T on: 03/14/2022 02:47 PM   Modules accepted: Orders

## 2022-03-14 NOTE — Telephone Encounter (Signed)
[facility] Patient Advocate Encounter   Received notification from [employer] Medicare D that prior authorization for Cassandra [name] is required.   PA submitted on 03/14/22 Key [id number] Status is pending    [facility] will continue to follow.   Ileene Patrick, [facility] Patient [facility] for Infectious Disease Phone: [phone] Fax:  [phone]

## 2022-03-14 NOTE — Telephone Encounter (Signed)
Spoke with Cassandra [name] over the phone with Dr. Gale Journey. She has had adverse events with several antibiotics limiting her choices for treatment. She is currently on IV amikacin, IV cefoxitin, and clofazimine. Per [employer] consult and standard treatment for M abscessus, a 4th agent is needed.   She has limited options already due to MDR organism. [name] is the best option but highly expensive through her Medicare insurance. Oral copay is ~$2000 for a 2 week supply. IV omadacycline is ~$200 for the loading dose then likely just as expensive for daily dose. She will proceed with IV for now. Discussed with Carolynn Sayers at [facility]. They will reach out to her to discuss finances and set up a payment plan, if needed. They will do the first dose in the home but will likely be next week as they must order the medication. Daughter is also involved with care. Pam will reach out to her to discuss. I told Pam to let me know if daughter would like a call from me as well. [name] will get a repeat CT in a few weeks once on appropriate regimen. She will reach out with questions and/or concerns.   We are also looking at cost of bedaquilline. [name] completed PA for this, but we are not hopeful it will be affordable either. Will follow up to see.   Her regimen will be: - IV amikacin 950 mg TIW - IV cefoxitin '6mg'$  continuous infusion daily - IV omadacycline 200 mg x 1 then 100 mg daily - Clofazimine 100 mg PO daily  Eira Alpert L. Mikela Senn, PharmD, BCIDP, AAHIVP, CPP Clinical Pharmacist Practitioner [place] for Infectious Disease 03/14/2022, 12:16 PM

## 2022-03-14 NOTE — Telephone Encounter (Signed)
And the bedaquiline is $1200/month as well. :(

## 2022-03-14 NOTE — Patient Instructions (Signed)
CT SCAN TO BE DONE ONE MONTH AFTER OMADOCYCLINE ADDED
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
## 2022-03-15 NOTE — Addendum Note (Signed)
Addended by: Daisy Floro T on: 03/15/2022 11:24 AM   Modules accepted: Orders

## 2022-03-18 ENCOUNTER — Ambulatory Visit (INDEPENDENT_AMBULATORY_CARE_PROVIDER_SITE_OTHER): Payer: Medicare Other | Admitting: Pharmacist

## 2022-03-18 ENCOUNTER — Telehealth: Payer: Self-pay | Admitting: Pharmacist

## 2022-03-18 ENCOUNTER — Other Ambulatory Visit: Payer: Self-pay

## 2022-03-18 DIAGNOSIS — M869 Osteomyelitis, unspecified: Secondary | ICD-10-CM

## 2022-03-18 DIAGNOSIS — A318 Other mycobacterial infections: Secondary | ICD-10-CM

## 2022-03-18 DIAGNOSIS — M8618 Other acute osteomyelitis, other site: Secondary | ICD-10-CM

## 2022-03-18 NOTE — Progress Notes (Signed)
Given difficulty financially with omadocycline, we are stuck with using imipenem for her.   In retrospect it is unclear if she has allergic reaction to imipenem; would be unusual as she is tolerating ceftriaxone and cefoxitin    Will plan 4 drug regimen to include Imipenem 500 mg iv q8hrs Cefoxitin 6 gram continuous daily infusion Amikacin 950 mg tiw And Oral clofazimine  Will also place a referral to [facility] for ongoing ntm care -- hopefully there is a way for her to get omadocycline as clearly this would be a better choice than adding imipenem back to her regimen  Diagnosis: M abscessus LLE osteomyelitis    Allergies  Allergen Reactions   Levofloxacin Other (See Comments)    Hallucinations     OPAT Orders Discharge antibiotics to be given via PICC line Imipenem 500 mg iv q8hrs Cefoxitin 6 gram continuous daily infusion Amikacin 950 mg tiw And Oral clofazimine   Duration: 3 months End Date: 10/20  [facility] Care Per Protocol:  Home health RN for IV administration and teaching; PICC line care and labs.    Labs weekly while on IV antibiotics: _x_ CBC with differential __ BMP _x_ CMP _x_ CRP __ ESR __ Vancomycin trough _x_ biw amikacin trough  __ Please pull PIC at completion of IV antibiotics _x_ Please leave PIC in place until doctor has seen patient or been notified  Fax weekly labs to [phone]

## 2022-03-18 NOTE — Telephone Encounter (Signed)
Sent community message to [facility] to add imipenem back to patient's IV regimen. Her antibiotics are as follows:   - IV cefoxitin 6 gm continuous infusion daily - IV amikacin 950 mg three times weekly - IV imipenem 500 mg q8h - PO clofazimine 100 mg PO daily  Kayla Gallis responded and verified receipt.   Monserat Prestigiacomo L. Angelicia Lessner, PharmD [facility] Clinical Pharmacist Practitioner
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
## 2022-04-04 ENCOUNTER — Telehealth: Payer: Self-pay | Admitting: Pharmacist

## 2022-04-04 NOTE — Telephone Encounter (Signed)
Cassandra [name] from [facility] contacted [place] team regarding Whitley's kidney function and recent amikacin troughs. We had received faxed labs for 7/24 but otherwise had not received labs from 7/13, 7/27 or 7/31. Her amikacin trough has been consistently greater than 1 mg/L since 7/13, but we were only notified today about the troughs as her serum creatinine increased from 1.07 mg/dL to 1.39 mg/dL on 7/31.   Ilayda denies any changes to her medications besides starting nystatin for thrush. Denies taking any OTC NSAIDs or other nephrotoxic medications. Has been consistently taking furosemide prior to initiating antibiotics for M. abscessus. Will update amikacin dosing from '950mg'$  TIW (~14.4 mg/kg). Her weights were inconsistent in Epic, so called Em[place] to weigh herself at home. She is currently 65.9 kilograms (145 lbs). Will decrease her dose to '875mg'$  TIW (~13.2 mg/kg).   Delesha also stated she had an appointment with [facility] this week which she cancelled due to the stress of moving/downsizing. She stated she will reach back out to them to reschedule later in August once everything has settled.   Alfonse Spruce, PharmD, CPP, [place] Clinical Pharmacist Practitioner Infectious [place] for Infectious Disease

## 2022-04-11 ENCOUNTER — Other Ambulatory Visit: Payer: Self-pay | Admitting: Family

## 2022-04-11 DIAGNOSIS — M869 Osteomyelitis, unspecified: Secondary | ICD-10-CM

## 2022-04-12 ENCOUNTER — Ambulatory Visit
Admission: RE | Admit: 2022-04-12 | Discharge: 2022-04-12 | Disposition: A | Discharge status: Home / Self Care | Payer: Medicare Other | Source: Ambulatory Visit | Attending: Internal Medicine | Admitting: Internal Medicine

## 2022-04-12 DIAGNOSIS — M869 Osteomyelitis, unspecified: Secondary | ICD-10-CM | POA: Diagnosis present

## 2022-04-12 LAB — POCT I-STAT CREATININE: Creatinine, Ser: 1.6 mg/dL — ABNORMAL HIGH (ref 0.44–1.00)

## 2022-04-12 MED ORDER — IOHEXOL 300 MG/ML  SOLN
100.0000 mL | Freq: Once | INTRAMUSCULAR | Status: AC | PRN
Start: 2022-04-12 — End: 2022-04-12
  Administered 2022-04-12: 65 mL via INTRAVENOUS

## 2022-04-24 ENCOUNTER — Telehealth: Payer: Self-pay | Admitting: Pharmacist

## 2022-04-24 ENCOUNTER — Other Ambulatory Visit: Payer: Self-pay | Admitting: Pharmacist

## 2022-04-24 DIAGNOSIS — M869 Osteomyelitis, unspecified: Secondary | ICD-10-CM

## 2022-04-24 MED ORDER — LINEZOLID 600 MG PO TABS: 600.0000 mg | ORAL_TABLET | Freq: Every day | ORAL | 5 refills | Status: DC

## 2022-04-24 NOTE — Telephone Encounter (Signed)
Spoke with Caleesi on the phone today. She is ok with proceeding with adding back low dose linezolid 600 mg daily. She is also seeing Dr. Doreatha Martin next week to discuss another clean out. She also stated that she is going to see a nephrologist soon too. Made her an appointment with Dr. Gale Journey on 9/6 at 345pm.

## 2022-04-24 NOTE — Telephone Encounter (Signed)
Cassandra [name]'s labs continue to be out of range on IV amikacin. Results from 8/21 labs show an increase in her SCr to 1.51. Previous results were 1.39 on 7/31. BMP was not done on 8/7 due to "insufficient specimen to perform or complete analysis". Amikacin trough was 1.6 on 8/7 and is 3 on current labs from 8/21. For some unknown reason, labs were not drawn at all the week of 8/14.  Unsure how to proceed as her amikacin trough is always detectable above the threshold for her dosing and indication. Her SCr is continuing to climb as well. I do not see that she made another appointment with [facility] either. Will send to Dr. Gale Journey for advice.  [name], PharmD, BCIDP, AAHIVP, CPP Clinical Pharmacist Practitioner Infectious Diseases [place] [facility] Infectious Disease 04/24/2022, 12:00 PM

## 2022-04-24 NOTE — Telephone Encounter (Signed)
Thanks Tiff!

## 2022-04-24 NOTE — Telephone Encounter (Signed)
Yes, we definitely can! I'm just worried that her troughs aren't getting drawn correctly and home health isn't doing a great job informing us and/or making sure her labs are even drawn in the first place. Just makes me so nervous!  I will call to lower her dose even more.

## 2022-04-24 NOTE — Telephone Encounter (Signed)
Thanks Tiffany. I missed that as it didn't load on my epic until just now. I will review. Thank you!

## 2022-04-25 NOTE — Telephone Encounter (Addendum)
Messaged Amy with [facility]. Decreased Cassandra [name]'s amikacin dose to 825 mg three times weekly (~12.5 mg/kg). Will continue to watch her SCr to make sure it stays stable/does not get worse.  Kaliana Albino L. Eber Hong, PharmD, BCIDP, AAHIVP, CPP Clinical Pharmacist Practitioner [place] for Infectious Disease 04/25/2022, 9:05 AM

## 2022-04-30 ENCOUNTER — Ambulatory Visit: Payer: Self-pay | Admitting: Student

## 2022-05-02 ENCOUNTER — Telehealth: Payer: Self-pay | Admitting: Pharmacist

## 2022-05-02 ENCOUNTER — Encounter (HOSPITAL_COMMUNITY): Payer: Self-pay | Admitting: Student

## 2022-05-02 ENCOUNTER — Other Ambulatory Visit: Payer: Self-pay

## 2022-05-02 NOTE — Telephone Encounter (Signed)
Spoke with Amy at [facility] and recommended lowering dose to '800mg'$  TIW (~11.7 mg/kg). Cassie and I both cannot find great resources or guidelines regarding target trough concentrations for patients with extrapulmonary abscessus. The only study that the 2020 IDSA pulmonary guidelines reference showed that 11 patients on TIW amikacin were averaging  a trough ~3 but most were less than 2 (but this was only observed - not recommended). Will keep our eyes on her kidney function and follow up on trough next week.

## 2022-05-02 NOTE — Telephone Encounter (Signed)
As previously discussed in [place] telephone note from 8/23, Cassandra [name]'s amikacin trough remains slightly above goal this week (drawn on 8/28). It is difficult to tell whether or not this trough is accurately drawn prior to her infusion. Her kidney function is slightly increased at Scr 1.66 mg/dL this week as compared to 1.51 mg/dL the prior week.  I worry about continuing to dose reduce amikacin as she is already down to ~12.5 mg/kg TIW compared to the recommended 15-25 mg/kg TIW. Dr. Gale Journey, please let us know what you would recommend. I would probably wait for another follow-up amikacin trough next week to see if it remains ~4-5 before further dose adjustment. If the trough and/or her kidney function continues to increase, I worry about decreasing her dose further due to concerns of suboptimal dosing.  Alfonse Spruce, PharmD, CPP, [place] Clinical Pharmacist Practitioner Infectious [place] for Infectious Disease

## 2022-05-02 NOTE — Telephone Encounter (Signed)
I think we can reduce dose tondesired trough (i dont think we have tonstay 12-15 mg)  Let me know  Thanks

## 2022-05-02 NOTE — Progress Notes (Addendum)
[name] denies chest pain or shortness of breath. Patient denies having any s/s of Covid in her household, also denies any known exposure to Covid.   Mrs. Cassandra [name] report that CBG's run low in the am, 40, 50.  I instructed patient to take 1/2 of sliding scale Lantus insulin tonight.  I instructed patient to check CBG after awaking and every 2 hours until arrival  to the hospital.  I Instructed patient if CBG is less than 70 to take 4 Glucose Tablets or 1 tube of Glucose Gel or 1/2 cup of a clear juice. Recheck CBG in 15 minutes if CBG is not over 70 call, pre- op desk at [phone] for further instructions.   [name]'s medications are in packages, she is not sure if she is on Plavix or not. I called [facility] in [place], I spoke with [name], who checked and reported that Plavix had not been put in her packaged since 10/2021.   [name] takes oral medications with milk, patient will not be taking in am.  [name] 's PCP is Dr. Denton Meek; cardiologist is Dr. Ubaldo Glassing; endocrinologist is Dr. Satira Anis.  I called Dr. Doreatha Martin 's office, I spoke to [place] asking her if Mrs. Cassandra [name] should take antibiotics today? Rebekah [place] will                           asked Dr Doreatha Martin and call me back.  I faxes [facility] operative prescription for implanted device to Dr. Bethanne Ginger office.  53 Cassandra [name] called, she left a message stating that patient should take antibiotics today. I called and spoke with Mr. Cassandra [name]  , I told him that patient should have antibiotics today.

## 2022-05-03 ENCOUNTER — Encounter (HOSPITAL_COMMUNITY): Payer: Self-pay | Admitting: Student

## 2022-05-03 ENCOUNTER — Encounter (HOSPITAL_COMMUNITY)
Admission: RE | Disposition: A | Discharge status: Home / Self Care | Payer: Self-pay | Source: Ambulatory Visit | Attending: Student

## 2022-05-03 ENCOUNTER — Ambulatory Visit (HOSPITAL_COMMUNITY): Payer: Medicare Other | Admitting: Certified Registered"

## 2022-05-03 ENCOUNTER — Observation Stay (HOSPITAL_COMMUNITY)
Admission: RE | Admit: 2022-05-03 | Discharge: 2022-05-04 | Disposition: A | Discharge status: Home / Self Care | Payer: Medicare Other | Source: Ambulatory Visit | Attending: Student | Admitting: Student

## 2022-05-03 ENCOUNTER — Ambulatory Visit (HOSPITAL_BASED_OUTPATIENT_CLINIC_OR_DEPARTMENT_OTHER): Payer: Medicare Other | Admitting: Certified Registered"

## 2022-05-03 ENCOUNTER — Ambulatory Visit (HOSPITAL_COMMUNITY): Payer: Medicare Other

## 2022-05-03 ENCOUNTER — Observation Stay (HOSPITAL_COMMUNITY): Payer: Medicare Other

## 2022-05-03 ENCOUNTER — Other Ambulatory Visit: Payer: Self-pay

## 2022-05-03 DIAGNOSIS — E039 Hypothyroidism, unspecified: Secondary | ICD-10-CM | POA: Diagnosis not present

## 2022-05-03 DIAGNOSIS — M869 Osteomyelitis, unspecified: Secondary | ICD-10-CM | POA: Diagnosis not present

## 2022-05-03 DIAGNOSIS — E1169 Type 2 diabetes mellitus with other specified complication: Secondary | ICD-10-CM

## 2022-05-03 DIAGNOSIS — E1151 Type 2 diabetes mellitus with diabetic peripheral angiopathy without gangrene: Secondary | ICD-10-CM | POA: Diagnosis not present

## 2022-05-03 DIAGNOSIS — Z79899 Other long term (current) drug therapy: Secondary | ICD-10-CM | POA: Diagnosis not present

## 2022-05-03 DIAGNOSIS — Z87891 Personal history of nicotine dependence: Secondary | ICD-10-CM | POA: Insufficient documentation

## 2022-05-03 DIAGNOSIS — L089 Local infection of the skin and subcutaneous tissue, unspecified: Secondary | ICD-10-CM | POA: Diagnosis present

## 2022-05-03 DIAGNOSIS — Z7902 Long term (current) use of antithrombotics/antiplatelets: Secondary | ICD-10-CM | POA: Diagnosis not present

## 2022-05-03 DIAGNOSIS — Z955 Presence of coronary angioplasty implant and graft: Secondary | ICD-10-CM | POA: Insufficient documentation

## 2022-05-03 DIAGNOSIS — F418 Other specified anxiety disorders: Secondary | ICD-10-CM | POA: Diagnosis not present

## 2022-05-03 DIAGNOSIS — Z950 Presence of cardiac pacemaker: Secondary | ICD-10-CM | POA: Insufficient documentation

## 2022-05-03 DIAGNOSIS — Z85828 Personal history of other malignant neoplasm of skin: Secondary | ICD-10-CM | POA: Diagnosis not present

## 2022-05-03 DIAGNOSIS — I2510 Atherosclerotic heart disease of native coronary artery without angina pectoris: Secondary | ICD-10-CM | POA: Insufficient documentation

## 2022-05-03 DIAGNOSIS — E1142 Type 2 diabetes mellitus with diabetic polyneuropathy: Secondary | ICD-10-CM | POA: Insufficient documentation

## 2022-05-03 DIAGNOSIS — J449 Chronic obstructive pulmonary disease, unspecified: Secondary | ICD-10-CM | POA: Insufficient documentation

## 2022-05-03 DIAGNOSIS — Z853 Personal history of malignant neoplasm of breast: Secondary | ICD-10-CM | POA: Diagnosis not present

## 2022-05-03 DIAGNOSIS — I10 Essential (primary) hypertension: Secondary | ICD-10-CM | POA: Insufficient documentation

## 2022-05-03 DIAGNOSIS — A318 Other mycobacterial infections: Secondary | ICD-10-CM

## 2022-05-03 DIAGNOSIS — Z7984 Long term (current) use of oral hypoglycemic drugs: Secondary | ICD-10-CM | POA: Insufficient documentation

## 2022-05-03 DIAGNOSIS — M868X6 Other osteomyelitis, lower leg: Principal | ICD-10-CM | POA: Insufficient documentation

## 2022-05-03 DIAGNOSIS — Z794 Long term (current) use of insulin: Secondary | ICD-10-CM

## 2022-05-03 HISTORY — PX: I & D EXTREMITY: SHX5045

## 2022-05-03 LAB — CBC
HCT: 34.2 % — ABNORMAL LOW (ref 36.0–46.0)
Hemoglobin: 10.8 g/dL — ABNORMAL LOW (ref 12.0–15.0)
MCH: 24.9 pg — ABNORMAL LOW (ref 26.0–34.0)
MCHC: 31.6 g/dL (ref 30.0–36.0)
MCV: 78.8 fL — ABNORMAL LOW (ref 80.0–100.0)
Platelets: 261 10*3/uL (ref 150–400)
RBC: 4.34 MIL/uL (ref 3.87–5.11)
RDW: 19.9 % — ABNORMAL HIGH (ref 11.5–15.5)
WBC: 13 10*3/uL — ABNORMAL HIGH (ref 4.0–10.5)
nRBC: 0 % (ref 0.0–0.2)

## 2022-05-03 LAB — BASIC METABOLIC PANEL
Anion gap: 10 (ref 5–15)
BUN: 24 mg/dL — ABNORMAL HIGH (ref 8–23)
CO2: 26 mmol/L (ref 22–32)
Calcium: 9.6 mg/dL (ref 8.9–10.3)
Chloride: 103 mmol/L (ref 98–111)
Creatinine, Ser: 1.62 mg/dL — ABNORMAL HIGH (ref 0.44–1.00)
GFR, Estimated: 31 mL/min — ABNORMAL LOW (ref >60)
Glucose, Bld: 118 mg/dL — ABNORMAL HIGH (ref 70–99)
Potassium: 5.1 mmol/L (ref 3.5–5.1)
Sodium: 139 mmol/L (ref 135–145)

## 2022-05-03 LAB — HEMOGLOBIN A1C
Hgb A1c MFr Bld: 7 % — ABNORMAL HIGH (ref 4.8–5.6)
Mean Plasma Glucose: 154.2 mg/dL

## 2022-05-03 LAB — GLUCOSE, CAPILLARY
Glucose-Capillary: 84 mg/dL (ref 70–99)
Glucose-Capillary: 97 mg/dL (ref 70–99)
Glucose-Capillary: 219 mg/dL — ABNORMAL HIGH (ref 70–99)
Glucose-Capillary: 163 mg/dL — ABNORMAL HIGH (ref 70–99)

## 2022-05-03 SURGERY — IRRIGATION AND DEBRIDEMENT EXTREMITY
Anesthesia: General | Site: Ankle | Laterality: Left

## 2022-05-03 MED ORDER — PROPOFOL 10 MG/ML IV BOLUS
INTRAVENOUS | Status: AC
  Filled 2022-05-03: qty 20

## 2022-05-03 MED ORDER — TOBRAMYCIN SULFATE 1.2 G IJ SOLR
INTRAMUSCULAR | Status: AC
  Filled 2022-05-03: qty 1.2

## 2022-05-03 MED ORDER — MORPHINE SULFATE (PF) 2 MG/ML IV SOLN: 0.5000 mg | INTRAVENOUS | Status: DC | PRN

## 2022-05-03 MED ORDER — CLOFAZIMINE POWD: 100.0000 mg | Freq: Every day | Status: AC

## 2022-05-03 MED ORDER — CLOFAZIMINE POWD
100.0000 mg | Freq: Every day | Status: DC
  Filled 2022-05-03: qty 2

## 2022-05-03 MED ORDER — ALBUTEROL SULFATE (2.5 MG/3ML) 0.083% IN NEBU
2.5000 mg | INHALATION_SOLUTION | Freq: Four times a day (QID) | RESPIRATORY_TRACT | Status: DC | PRN
Start: 2022-05-03 — End: 2022-05-04

## 2022-05-03 MED ORDER — EPHEDRINE 5 MG/ML INJ
INTRAVENOUS | Status: AC
  Filled 2022-05-03: qty 5

## 2022-05-03 MED ORDER — INSULIN ASPART 100 UNIT/ML IJ SOLN: 0.0000 [IU] | Freq: Every day | INTRAMUSCULAR | Status: DC

## 2022-05-03 MED ORDER — PHENYLEPHRINE 80 MCG/ML (10ML) SYRINGE FOR IV PUSH (FOR BLOOD PRESSURE SUPPORT)
PREFILLED_SYRINGE | INTRAVENOUS | Status: DC | PRN
  Administered 2022-05-03 (×3): 80 ug via INTRAVENOUS
  Administered 2022-05-03: 40 ug via INTRAVENOUS
  Administered 2022-05-03: 160 ug via INTRAVENOUS
  Administered 2022-05-03 (×2): 80 ug via INTRAVENOUS
  Administered 2022-05-03 (×3): 160 ug via INTRAVENOUS

## 2022-05-03 MED ORDER — LEVOTHYROXINE SODIUM 100 MCG PO TABS
100.0000 ug | ORAL_TABLET | Freq: Every morning | ORAL | Status: DC
  Administered 2022-05-04: 100 ug via ORAL
  Filled 2022-05-03: qty 1

## 2022-05-03 MED ORDER — METHOCARBAMOL 500 MG PO TABS
500.0000 mg | ORAL_TABLET | Freq: Four times a day (QID) | ORAL | Status: DC | PRN
  Administered 2022-05-03: 500 mg via ORAL
  Filled 2022-05-03: qty 1

## 2022-05-03 MED ORDER — SODIUM CHLORIDE 0.9% FLUSH: 10.0000 mL | INTRAVENOUS | Status: DC | PRN

## 2022-05-03 MED ORDER — VENLAFAXINE HCL ER 75 MG PO CP24
75.0000 mg | ORAL_CAPSULE | Freq: Every day | ORAL | Status: DC
  Administered 2022-05-04: 75 mg via ORAL
  Filled 2022-05-03: qty 1

## 2022-05-03 MED ORDER — METOCLOPRAMIDE HCL 5 MG PO TABS: 5.0000 mg | ORAL_TABLET | Freq: Three times a day (TID) | ORAL | Status: DC | PRN

## 2022-05-03 MED ORDER — AMIKACIN IV (FOR PTA / DISCHARGE USE ONLY): 800.0000 mg | INTRAVENOUS | 0 refills | Status: DC

## 2022-05-03 MED ORDER — ACETAMINOPHEN 325 MG PO TABS
325.0000 mg | ORAL_TABLET | Freq: Four times a day (QID) | ORAL | Status: DC | PRN
  Administered 2022-05-03: 325 mg via ORAL

## 2022-05-03 MED ORDER — AMITRIPTYLINE HCL 50 MG PO TABS
25.0000 mg | ORAL_TABLET | Freq: Every day | ORAL | Status: DC
  Administered 2022-05-03: 25 mg via ORAL
  Filled 2022-05-03: qty 1

## 2022-05-03 MED ORDER — LINEZOLID 600 MG PO TABS
600.0000 mg | ORAL_TABLET | Freq: Every day | ORAL | Status: DC
  Administered 2022-05-04 (×2): 600 mg via ORAL
  Filled 2022-05-03 (×3): qty 1

## 2022-05-03 MED ORDER — DEXAMETHASONE SODIUM PHOSPHATE 10 MG/ML IJ SOLN
INTRAMUSCULAR | Status: DC | PRN
  Administered 2022-05-03: 4 mg via INTRAVENOUS

## 2022-05-03 MED ORDER — FENTANYL CITRATE (PF) 250 MCG/5ML IJ SOLN
INTRAMUSCULAR | Status: DC | PRN
  Administered 2022-05-03: 25 ug via INTRAVENOUS
  Administered 2022-05-03: 50 ug via INTRAVENOUS

## 2022-05-03 MED ORDER — TRAMADOL HCL 50 MG PO TABS: 50.0000 mg | ORAL_TABLET | Freq: Four times a day (QID) | ORAL | Status: DC | PRN

## 2022-05-03 MED ORDER — FENTANYL CITRATE (PF) 100 MCG/2ML IJ SOLN
INTRAMUSCULAR | Status: AC
  Filled 2022-05-03: qty 2

## 2022-05-03 MED ORDER — NYSTATIN 100000 UNIT/ML MT SUSP: 2.0000 mL | Freq: Every day | OROMUCOSAL | Status: DC | PRN

## 2022-05-03 MED ORDER — METOCLOPRAMIDE HCL 5 MG/ML IJ SOLN: 5.0000 mg | Freq: Three times a day (TID) | INTRAMUSCULAR | Status: DC | PRN

## 2022-05-03 MED ORDER — DEXAMETHASONE SODIUM PHOSPHATE 10 MG/ML IJ SOLN
INTRAMUSCULAR | Status: AC
Start: 2022-05-03 — End: ?
  Filled 2022-05-03: qty 1

## 2022-05-03 MED ORDER — HYDRALAZINE HCL 20 MG/ML IJ SOLN
INTRAMUSCULAR | Status: AC
  Filled 2022-05-03: qty 1

## 2022-05-03 MED ORDER — NITROGLYCERIN 0.4 MG SL SUBL: 0.4000 mg | SUBLINGUAL_TABLET | SUBLINGUAL | Status: DC | PRN

## 2022-05-03 MED ORDER — METHYLENE BLUE 1 % INJ SOLN
INTRAVENOUS | Status: AC
Start: 2022-05-03 — End: ?
  Filled 2022-05-03: qty 10

## 2022-05-03 MED ORDER — SODIUM CHLORIDE 0.9 % IV SOLN: INTRAVENOUS | Status: DC

## 2022-05-03 MED ORDER — FUROSEMIDE 20 MG PO TABS
20.0000 mg | ORAL_TABLET | Freq: Every day | ORAL | Status: DC
  Administered 2022-05-03 – 2022-05-04 (×2): 20 mg via ORAL
  Filled 2022-05-03 (×2): qty 1

## 2022-05-03 MED ORDER — LIDOCAINE 2% (20 MG/ML) 5 ML SYRINGE
INTRAMUSCULAR | Status: AC
  Filled 2022-05-03: qty 5

## 2022-05-03 MED ORDER — IMIPENEM-CILASTATIN 500 MG IV SOLR
500.0000 mg | Freq: Three times a day (TID) | INTRAVENOUS | Status: DC
Start: 2022-05-03 — End: 2022-05-03

## 2022-05-03 MED ORDER — METHOCARBAMOL 1000 MG/10ML IJ SOLN: 500.0000 mg | Freq: Four times a day (QID) | INTRAVENOUS | Status: DC | PRN

## 2022-05-03 MED ORDER — CEFOXITIN SODIUM 2 G IV SOLR
2.0000 g | Freq: Three times a day (TID) | INTRAVENOUS | Status: DC
Start: 2022-05-03 — End: 2022-05-03

## 2022-05-03 MED ORDER — INSULIN ASPART 100 UNIT/ML IJ SOLN
0.0000 [IU] | Freq: Three times a day (TID) | INTRAMUSCULAR | Status: DC
  Administered 2022-05-03: 5 [IU] via SUBCUTANEOUS

## 2022-05-03 MED ORDER — ASPIRIN 325 MG PO TABS
325.0000 mg | ORAL_TABLET | Freq: Every day | ORAL | Status: DC
  Administered 2022-05-04: 325 mg via ORAL
  Filled 2022-05-03: qty 1

## 2022-05-03 MED ORDER — INSULIN ASPART 100 UNIT/ML IJ SOLN: 0.0000 [IU] | INTRAMUSCULAR | Status: DC | PRN

## 2022-05-03 MED ORDER — ONDANSETRON HCL 4 MG/2ML IJ SOLN: 4.0000 mg | Freq: Four times a day (QID) | INTRAMUSCULAR | Status: DC | PRN

## 2022-05-03 MED ORDER — SODIUM CHLORIDE 0.9% FLUSH
10.0000 mL | Freq: Two times a day (BID) | INTRAVENOUS | Status: DC
  Administered 2022-05-03: 10 mL
  Administered 2022-05-04: 40 mL

## 2022-05-03 MED ORDER — TOBRAMYCIN SULFATE 1.2 G IJ SOLR
INTRAMUSCULAR | Status: DC | PRN
  Administered 2022-05-03: 1.2 g

## 2022-05-03 MED ORDER — HYDROCODONE-ACETAMINOPHEN 5-325 MG PO TABS
1.0000 | ORAL_TABLET | ORAL | 0 refills | Status: DC | PRN
Start: 2022-05-03 — End: 2022-06-04

## 2022-05-03 MED ORDER — VITAMIN D 25 MCG (1000 UNIT) PO TABS
1000.0000 [IU] | ORAL_TABLET | Freq: Every day | ORAL | Status: DC
  Administered 2022-05-03 – 2022-05-04 (×2): 1000 [IU] via ORAL
  Filled 2022-05-03 (×2): qty 1

## 2022-05-03 MED ORDER — CEFOXITIN SODIUM 2 G IV SOLR
2.0000 g | Freq: Two times a day (BID) | INTRAVENOUS | Status: DC
  Administered 2022-05-03 – 2022-05-04 (×3): 2 g via INTRAVENOUS
  Filled 2022-05-03 (×4): qty 2

## 2022-05-03 MED ORDER — PHENYLEPHRINE 80 MCG/ML (10ML) SYRINGE FOR IV PUSH (FOR BLOOD PRESSURE SUPPORT)
PREFILLED_SYRINGE | INTRAVENOUS | Status: AC
  Filled 2022-05-03: qty 10

## 2022-05-03 MED ORDER — DEXTROSE 5 % IV SOLN: 2.0000 g | Freq: Two times a day (BID) | INTRAVENOUS | 0 refills | Status: DC

## 2022-05-03 MED ORDER — HYDROCODONE-ACETAMINOPHEN 5-325 MG PO TABS
1.0000 | ORAL_TABLET | ORAL | Status: DC | PRN
  Administered 2022-05-03 – 2022-05-04 (×3): 2 via ORAL
  Filled 2022-05-03 (×3): qty 2

## 2022-05-03 MED ORDER — CHLORHEXIDINE GLUCONATE 0.12 % MT SOLN
15.0000 mL | Freq: Once | OROMUCOSAL | Status: AC
  Administered 2022-05-03: 15 mL via OROMUCOSAL
  Filled 2022-05-03: qty 15

## 2022-05-03 MED ORDER — 0.9 % SODIUM CHLORIDE (POUR BTL) OPTIME
TOPICAL | Status: DC | PRN
  Administered 2022-05-03: 1000 mL

## 2022-05-03 MED ORDER — HYDRALAZINE HCL 10 MG PO TABS: 10.0000 mg | ORAL_TABLET | Freq: Four times a day (QID) | ORAL | Status: DC | PRN

## 2022-05-03 MED ORDER — ORAL CARE MOUTH RINSE: 15.0000 mL | Freq: Once | OROMUCOSAL | Status: AC

## 2022-05-03 MED ORDER — ONDANSETRON HCL 4 MG/2ML IJ SOLN
INTRAMUSCULAR | Status: AC
  Filled 2022-05-03: qty 2

## 2022-05-03 MED ORDER — AMIKACIN SULFATE 1 GM/4ML IJ SOLN: 825.0000 mg | INTRAMUSCULAR | Status: DC

## 2022-05-03 MED ORDER — CHLORHEXIDINE GLUCONATE CLOTH 2 % EX PADS: 6.0000 | MEDICATED_PAD | Freq: Every day | CUTANEOUS | Status: DC

## 2022-05-03 MED ORDER — METFORMIN HCL 500 MG PO TABS
500.0000 mg | ORAL_TABLET | Freq: Two times a day (BID) | ORAL | Status: DC
  Administered 2022-05-03 – 2022-05-04 (×2): 500 mg via ORAL
  Filled 2022-05-03 (×2): qty 1

## 2022-05-03 MED ORDER — TRAZODONE HCL 50 MG PO TABS
50.0000 mg | ORAL_TABLET | Freq: Every day | ORAL | Status: DC
  Administered 2022-05-03: 50 mg via ORAL
  Filled 2022-05-03: qty 1

## 2022-05-03 MED ORDER — VANCOMYCIN HCL 1000 MG IV SOLR
INTRAVENOUS | Status: AC
  Filled 2022-05-03: qty 20

## 2022-05-03 MED ORDER — HYDRALAZINE HCL 20 MG/ML IJ SOLN
10.0000 mg | Freq: Four times a day (QID) | INTRAMUSCULAR | Status: DC | PRN
  Administered 2022-05-03: 10 mg via INTRAVENOUS

## 2022-05-03 MED ORDER — LINEZOLID 600 MG PO TABS: 600.0000 mg | ORAL_TABLET | Freq: Every day | ORAL | Status: DC

## 2022-05-03 MED ORDER — HYDROXYZINE PAMOATE 25 MG PO CAPS
25.0000 mg | ORAL_CAPSULE | Freq: Three times a day (TID) | ORAL | Status: DC | PRN
Start: 2022-05-03 — End: 2022-05-04

## 2022-05-03 MED ORDER — ONDANSETRON HCL 4 MG PO TABS: 4.0000 mg | ORAL_TABLET | Freq: Four times a day (QID) | ORAL | Status: DC | PRN

## 2022-05-03 MED ORDER — DEXTROSE 5 % IV SOLN
800.0000 mg | INTRAVENOUS | Status: DC
  Filled 2022-05-03: qty 3.2

## 2022-05-03 MED ORDER — PROPOFOL 10 MG/ML IV BOLUS
INTRAVENOUS | Status: DC | PRN
  Administered 2022-05-03: 100 mg via INTRAVENOUS

## 2022-05-03 MED ORDER — POLYETHYLENE GLYCOL 3350 17 G PO PACK: 17.0000 g | PACK | Freq: Every day | ORAL | Status: DC | PRN

## 2022-05-03 MED ORDER — METHYLENE BLUE 1 % INJ SOLN
INTRAVENOUS | Status: DC | PRN
  Administered 2022-05-03: 3 mL via SUBMUCOSAL

## 2022-05-03 MED ORDER — ONDANSETRON HCL 4 MG/2ML IJ SOLN
INTRAMUSCULAR | Status: DC | PRN
  Administered 2022-05-03: 4 mg via INTRAVENOUS

## 2022-05-03 MED ORDER — ACETAMINOPHEN 325 MG PO TABS
650.0000 mg | ORAL_TABLET | Freq: Four times a day (QID) | ORAL | Status: DC
Start: 2022-05-03 — End: 2022-05-04
  Administered 2022-05-03 – 2022-05-04 (×2): 650 mg via ORAL
  Filled 2022-05-03 (×3): qty 2

## 2022-05-03 MED ORDER — LIDOCAINE 2% (20 MG/ML) 5 ML SYRINGE
INTRAMUSCULAR | Status: DC | PRN
  Administered 2022-05-03: 50 mg via INTRAVENOUS

## 2022-05-03 MED ORDER — LACTATED RINGERS IV SOLN: INTRAVENOUS | Status: DC

## 2022-05-03 MED ORDER — VANCOMYCIN HCL 1000 MG IV SOLR
INTRAVENOUS | Status: DC | PRN
  Administered 2022-05-03: 1000 mg

## 2022-05-03 MED ORDER — DOCUSATE SODIUM 100 MG PO CAPS
100.0000 mg | ORAL_CAPSULE | Freq: Two times a day (BID) | ORAL | Status: DC
  Administered 2022-05-03 – 2022-05-04 (×2): 100 mg via ORAL
  Filled 2022-05-03 (×3): qty 1

## 2022-05-03 MED ORDER — ALBUTEROL SULFATE HFA 108 (90 BASE) MCG/ACT IN AERS: 2.0000 | INHALATION_SPRAY | Freq: Four times a day (QID) | RESPIRATORY_TRACT | Status: DC | PRN

## 2022-05-03 SURGICAL SUPPLY — 56 items
BAG COUNTER SPONGE SURGICOUNT (BAG) ×1 IMPLANT
BNDG COHESIVE 4X5 TAN STRL (GAUZE/BANDAGES/DRESSINGS) ×1 IMPLANT
BNDG ELASTIC 4X5.8 VLCR STR LF (GAUZE/BANDAGES/DRESSINGS) IMPLANT
BNDG GAUZE DERMACEA FLUFF 4 (GAUZE/BANDAGES/DRESSINGS) ×2 IMPLANT
BOWL SMART MIX CTS (DISPOSABLE) IMPLANT
BRUSH SCRUB EZ PLAIN DRY (MISCELLANEOUS) ×2 IMPLANT
CEMENT BONE SIMPLEX SPEEDSET (Cement) IMPLANT
CHLORAPREP W/TINT 26 (MISCELLANEOUS) ×1 IMPLANT
CNTNR URN SCR LID CUP LEK RST (MISCELLANEOUS) IMPLANT
CONT SPEC 4OZ STRL OR WHT (MISCELLANEOUS) ×2
COVER MAYO STAND STRL (DRAPES) ×1 IMPLANT
COVER SURGICAL LIGHT HANDLE (MISCELLANEOUS) ×2 IMPLANT
DRAPE ORTHO SPLIT 77X108 STRL (DRAPES)
DRAPE SURG 17X23 STRL (DRAPES) ×1 IMPLANT
DRAPE SURG ORHT 6 SPLT 77X108 (DRAPES) ×1 IMPLANT
DRAPE U-SHAPE 47X51 STRL (DRAPES) ×1 IMPLANT
DRSG ADAPTIC 3X8 NADH LF (GAUZE/BANDAGES/DRESSINGS) ×1 IMPLANT
DRSG MEPITEL 4X7.2 (GAUZE/BANDAGES/DRESSINGS) IMPLANT
ELECT REM PT RETURN 9FT ADLT (ELECTROSURGICAL)
ELECTRODE REM PT RTRN 9FT ADLT (ELECTROSURGICAL) IMPLANT
EVACUATOR 1/8 PVC DRAIN (DRAIN) IMPLANT
GAUZE PAD ABD 8X10 STRL (GAUZE/BANDAGES/DRESSINGS) IMPLANT
GAUZE SPONGE 4X4 12PLY STRL (GAUZE/BANDAGES/DRESSINGS) ×1 IMPLANT
GLOVE BIO SURGEON STRL SZ 6.5 (GLOVE) ×3 IMPLANT
GLOVE BIO SURGEON STRL SZ7.5 (GLOVE) ×4 IMPLANT
GLOVE BIOGEL PI IND STRL 6.5 (GLOVE) ×1 IMPLANT
GLOVE BIOGEL PI IND STRL 7.5 (GLOVE) ×1 IMPLANT
GLOVE BIOGEL PI INDICATOR 6.5 (GLOVE) ×1
GLOVE BIOGEL PI INDICATOR 7.5 (GLOVE) ×1
GOWN STRL REUS W/ TWL LRG LVL3 (GOWN DISPOSABLE) ×2 IMPLANT
GOWN STRL REUS W/TWL LRG LVL3 (GOWN DISPOSABLE) ×2
HANDPIECE INTERPULSE COAX TIP (DISPOSABLE)
KIT BASIN OR (CUSTOM PROCEDURE TRAY) ×1 IMPLANT
KIT TURNOVER KIT B (KITS) ×1 IMPLANT
MANIFOLD NEPTUNE II (INSTRUMENTS) ×1 IMPLANT
NS IRRIG 1000ML POUR BTL (IV SOLUTION) ×1 IMPLANT
PACK ORTHO EXTREMITY (CUSTOM PROCEDURE TRAY) ×1 IMPLANT
PAD ARMBOARD 7.5X6 YLW CONV (MISCELLANEOUS) ×2 IMPLANT
PAD CAST 4YDX4 CTTN HI CHSV (CAST SUPPLIES) IMPLANT
PADDING CAST COTTON 4X4 STRL (CAST SUPPLIES) ×1
PADDING CAST COTTON 6X4 STRL (CAST SUPPLIES) ×1 IMPLANT
SET HNDPC FAN SPRY TIP SCT (DISPOSABLE) IMPLANT
SPONGE T-LAP 18X18 ~~LOC~~+RFID (SPONGE) ×1 IMPLANT
SUT ETHILON 2 0 FS 18 (SUTURE) ×2 IMPLANT
SUT ETHILON 3 0 FSL (SUTURE) IMPLANT
SUT ETHILON 3 0 PS 1 (SUTURE) ×2 IMPLANT
SUT MON AB 2-0 CT1 36 (SUTURE) ×1 IMPLANT
SUT PDS AB 0 CT 36 (SUTURE) IMPLANT
SWAB CULTURE ESWAB REG 1ML (MISCELLANEOUS) IMPLANT
SYR 10ML LL (SYRINGE) IMPLANT
TOWEL GREEN STERILE (TOWEL DISPOSABLE) ×2 IMPLANT
TOWEL GREEN STERILE FF (TOWEL DISPOSABLE) ×1 IMPLANT
TUBE CONNECTING 12X1/4 (SUCTIONS) ×1 IMPLANT
UNDERPAD 30X36 HEAVY ABSORB (UNDERPADS AND DIAPERS) ×1 IMPLANT
WATER STERILE IRR 1000ML POUR (IV SOLUTION) ×1 IMPLANT
YANKAUER SUCT BULB TIP NO VENT (SUCTIONS) ×1 IMPLANT

## 2022-05-03 NOTE — Op Note (Signed)
Orthopaedic Surgery Operative Note (CSN: [id number] ) Date of Surgery: 05/03/2022  Admit Date: 05/03/2022   Diagnoses: Pre-Op Diagnoses: Persistent left distal tibia osteomyelitis  Post-Op Diagnosis: Same  Procedures: CPT 27607-Debridement of left distal tibia osteomyelitis CPT 11981-Placement of antibiotic cement spacer  Surgeons : Primary: Shona Needles, MD  Assistant: [name], PA-C  Location: OR 3   Anesthesia:General   Antibiotics: Scheduled antibiotics per ID  Tourniquet time:None   Estimated Blood JQBH:41 mL  Complications:* No complications entered in OR log *   Specimens: ID Type Source Tests Collected by Time Destination  A : Left Ankle 1 Tissue PATH Other AEROBIC/ANAEROBIC CULTURE W GRAM STAIN (SURGICAL/DEEP WOUND) Shona Needles, MD 05/03/2022 [phone]   B : Left Ankle 2 Tissue PATH Other AEROBIC/ANAEROBIC CULTURE W GRAM STAIN (SURGICAL/DEEP WOUND) Shona Needles, MD 05/03/2022 0750   C : Left Ankle 3 Tissue PATH Other AEROBIC/ANAEROBIC CULTURE W GRAM STAIN (SURGICAL/DEEP WOUND) Shona Needles, MD 05/03/2022 0800      Implants: Implant Name Type Inv. Item Serial No. Manufacturer Lot No. LRB No. Used Action  CEMENT BONE SIMPLEX SPEEDSET - [id number] Cement CEMENT BONE SIMPLEX SPEEDSET  [employer] [id number] Left 1 Implanted     Indications for Surgery: 88 year old female who had an open ankle fracture dislocation treated with open reduction internal fixation in June 2022.  She subsequently developed end-stage ankle arthritis and wear plans to perform a ankle fusion however she had significant purulence when I went to do that surgery.  As result I removed all her hardware and performed a debridement.  She had persistent drainage and elevated inflammatory markers that required a second irrigation debridement in April.  She continues to have persistent drainage and elevated inflammatory markers.  CT scan of her ankle shows persistent worsening of the erosion and  possibly a sequestrum.  Due to his continued infection I felt that she was indicated for a repeat irrigation debridement.  Risks and benefits were discussed with the patient.  Risks included but not limited to bleeding, persistent infection, need for further surgery, nerve or blood vessel injury, even the possibility of anesthetic complications.  They agreed to proceed with surgery and consent was obtained.  Operative Findings: 1.  Excision of medial sinus tract with debridement of distal tibial osteomyelitis with debridement and removal of sequestrum that was present. 2.  Placement of antibiotic impregnated cement in the distal tibial metaphysis bone defect that was present  Procedure: The patient was identified in the preoperative holding area. Consent was confirmed with the patient and their family and all questions were answered. The operative extremity was marked after confirmation with the patient. she was then brought back to the operating room by our anesthesia colleagues.  She was placed under general anesthetic.  Her left lower extremity was then prepped and draped in usual sterile fashion.  A timeout was performed to verify the patient, the procedure, and the extremity.  No preoperative antibiotics were dosed secondary to intraoperative cultures to be taken also her scheduled antibiotics per infectious disease.  For started out by opening up the small wound over her lateral ankle that had persistent drainage.  There was some fibrinous tissue that was expressed through this wound that we sent for culture.  I did not further extend the wound.  I then turned my attention to the medial sinus tract that was present.  I extended this proximally and distally to expose the medial malleolus.  Here I encountered some very soft bone  the medial malleolus that tracked to the cavitary defect within the distal tibia and tibiotalar joint.  I used curettes and rongeur to debride as much of the tissue as possible.   The metaphyseal bone did have the appearance that it was involved with the infection.  I was able to debride the entirety of the cavity.  I was able to feel in identify that it tracked to the lateral wound.  I then irrigated the wound and proceeded to take 2 more cultures from the medial side prior to the irrigation.  I then mixed cement with 1 g of vancomycin powder 1.2 g tobramycin powder and placed methylene blue with this and proceeded to place this into the cavitary defect.  Fluoroscopic imaging showed the cement spacer in place.  I then closed the wounds with 3-0 nylon suture.  Sterile dressings were applied.  The patient was then awoke from anesthesia and taken to the PACU in stable condition.  Post Op Plan/Instructions: The patient will be weightbearing as tolerated to the left lower extremity.  She will receive her antibiotic regiment per infectious disease.  We will plan to keep her overnight for observation and discharged home in the morning.  I was present and performed the entire surgery.  [name], PA-C did assist me throughout the case. An assistant was necessary given the difficulty in approach, maintenance of reduction and ability to instrument the fracture.   [name], MD [facility]

## 2022-05-03 NOTE — Progress Notes (Signed)
PHARMACY CONSULT NOTE FOR:  OUTPATIENT  PARENTERAL ANTIBIOTIC THERAPY (OPAT)  Indication: mycobacterium abscesses LLE osteomyelitis Regimen:  Amikacin 800 mg IV QTTS AND Cefoxitin 2g IV Q12H  Adjunctive oral meds: Linezolid 600 mg daily AND Clofazamine 100 mg daily  End date: Indefinite (stop date entered as 09/01/22)  IV antibiotic discharge orders are pended. To discharging provider:  please sign these orders via discharge navigator,  Select New Orders & click on the button choice - Manage This Unsigned Work.     Thank you for allowing pharmacy to be a part of this patient's care.  Adria Dill, PharmD PGY-2 Infectious Diseases Resident  05/03/2022 2:50 PM

## 2022-05-03 NOTE — H&P (Signed)
Orthopaedic Trauma Service (OTS) H&P  Patient ID: Cassandra [name] MRN: [id number] DOB/AGE: 86-Jun-1934 86 y.o.  Reason for Surgery: Left ankle wound  HPI: Cassandra [name] is an 88 y.o. female with medical history significant for type 2 diabetes, diabetic polyneuropathy, essential hypertension, hyperlipidemia, hypothyroidism, history of breast cancer, former tobacco user, COPD, OSA, coronary artery disease, peripheral vascular disease, carotid artery occlusion status post carotid stent placement, gout presenting for surgery of left ankle. Patient previously involved in [place] in June 2022, sustained left open ankle fracture which required initial external fixation and subsequently ORIF of the fracture.  Patient unfortunately went on to develop some posttraumatic arthritis and presented in February 2023 for tibiotalar arthrodesis.  At the time of the surgery, infection was incidentally found and therefore we did not proceed with planned arthrodesis.  Intraoperative cultures were obtained which showed no growth.  Patient was started on IV antibiotics and has been followed by infectious disease over the last 6 months, including treatment with both IV and oral antibiotics.  Despite antibiotics and regular follow-up with infectious disease, patient's wounds continue to drain.  These areas are concerning for continued infection.  Most recent CT scan performed on 04/12/2022 also shows concern for continued/worsening infection.  She presents now for irrigation and debridement of the left ankle.  She denies any fever or chills.  She has been able to ambulate on bilateral lower extremities with no assistive device.  Hemoglobin A1c was 6.8 when last checked in June 2023.    Past Medical History:  Diagnosis Date   Acquired hypothyroidism 01/06/2014   Anxiety    Arthritis    B12 deficiency 02/15/2014   Benign essential hypertension 01/06/2014   Breast cancer ([place]) 1982   Right breast cancer - chemotherapy   CAD  (coronary artery disease), native coronary artery 01/06/2014   Carotid artery calcification    Chronic airway obstruction, not elsewhere classified 01/06/2014   Chronic mesenteric ischemia (HCC)    s/p SMA stent 11/03/19   Depression    Diabetes mellitus without complication ([place])    Dysrhythmia    aflutter with RVR 06/2016   History of kidney stones 2019   left ureteral stone   Incontinence of urine    Myocardial infarction ([place]) 1982   small infarct   Neuromuscular disorder (HCC)    restless legs   Pacemaker    Presence of permanent cardiac pacemaker 07/2016   Pure hypercholesterolemia 01/06/2014   Renal insufficiency    Skin cancer    Sleep apnea    no longer uses a cpap. Uses oxygen as needed    Past Surgical History:  Procedure Laterality Date   ANKLE FUSION Left 10/10/2021   Procedure: ANKLE FUSION;  Surgeon: Shona Needles, MD;  Location: [facility];  Service: Orthopedics;  Laterality: Left;   APPENDECTOMY     APPLICATION OF WOUND VAC Left 02/24/2021   Procedure: APPLICATION OF WOUND VAC;  Surgeon: Rod Can, MD;  Location: [facility];  Service: Orthopedics;  Laterality: Left;   AUGMENTATION MAMMAPLASTY Bilateral 1982/redo in 2014   prior mastectomy   CARDIAC CATHETERIZATION N/A 04/23/2016   Procedure: Left Heart Cath and Coronary Angiography;  Surgeon: Corey Skains, MD;  Location: [facility];  Service: Cardiovascular;  Laterality: N/A;   CARDIAC CATHETERIZATION Left 04/23/2016   Procedure: Coronary Stent Intervention;  Surgeon: Yolonda Kida, MD;  Location: [facility];  Service: Cardiovascular;  Laterality: Left;   CAROTID ARTERY ANGIOPLASTY Left 2010  had stent inserted and removed d/t infection. artery from leg inserted in left carotid   CAROTID ENDARTERECTOMY Left 2010   left CEA ~ 2010, s/p excision infected left carotid patch & pseudoaneurysm with left CCA-ICA bypass using SVG 05/20/12   CHOLECYSTECTOMY     CORONARY ANGIOPLASTY WITH STENT  PLACEMENT  september 9th 2017   CYSTOSCOPY W/ URETERAL STENT PLACEMENT Left 08/17/2017   Procedure: CYSTOSCOPY WITH RETROGRADE PYELOGRAM/URETERAL STENT PLACEMENT;  Surgeon: Festus Aloe, MD;  Location: [facility];  Service: Urology;  Laterality: Left;   CYSTOSCOPY W/ URETERAL STENT PLACEMENT Left 09/16/2017   Procedure: CYSTOSCOPY WITH STENT REPLACEMENT;  Surgeon: Abbie Sons, MD;  Location: [facility];  Service: Urology;  Laterality: Left;   CYSTOSCOPY/RETROGRADE/URETEROSCOPY/STONE EXTRACTION WITH BASKET Left 09/16/2017   Procedure: CYSTOSCOPY/RETROGRADE/URETEROSCOPY/STONE EXTRACTION WITH BASKET;  Surgeon: Abbie Sons, MD;  Location: [facility];  Service: Urology;  Laterality: Left;   EXTERNAL FIXATION LEG Left 02/24/2021   Procedure: EXTERNAL FIXATION  ANKLE;  Surgeon: Rod Can, MD;  Location: [facility];  Service: Orthopedics;  Laterality: Left;   EXTERNAL FIXATION REMOVAL Left 02/26/2021   Procedure: REMOVAL EXTERNAL FIXATION LEG;  Surgeon: Shona Needles, MD;  Location: [facility];  Service: Orthopedics;  Laterality: Left;   EYE SURGERY Bilateral    cataract extraction   I & D EXTREMITY Left 02/24/2021   Procedure: IRRIGATION AND DEBRIDEMENT EXTREMITY;  Surgeon: Rod Can, MD;  Location: [facility];  Service: Orthopedics;  Laterality: Left;   I & D EXTREMITY Left 12/21/2021   Procedure: IRRIGATION AND DEBRIDEMENT LEFT ANKLE;  Surgeon: Shona Needles, MD;  Location: [facility];  Service: Orthopedics;  Laterality: Left;   IR FLUORO GUIDE CV LINE LEFT  12/27/2021   IR FLUORO GUIDE CV LINE RIGHT  10/12/2021   IR REMOVAL TUN CV CATH W/O FL  11/22/2021   IR US GUIDE VASC ACCESS LEFT  12/27/2021   IR US GUIDE VASC ACCESS RIGHT  10/12/2021   KYPHOPLASTY N/A 02/03/2018   Procedure: [id number];  Surgeon: Hessie Knows, MD;  Location: [facility];  Service: Orthopedics;  Laterality: N/A;   MASTECTOMY Right 1982   ORIF ANKLE FRACTURE Left 02/26/2021   Procedure: OPEN REDUCTION INTERNAL FIXATION  (ORIF) ANKLE FRACTURE;  Surgeon: Shona Needles, MD;  Location: [facility];  Service: Orthopedics;  Laterality: Left;   PACEMAKER INSERTION Left 07/03/2016   Procedure: INSERTION PACEMAKER;  Surgeon: Isaias Cowman, MD;  Location: [facility];  Service: Cardiovascular;  Laterality: Left;   SKIN CANCER EXCISION     TONSILLECTOMY     TUBAL LIGATION     VISCERAL ANGIOGRAPHY N/A 11/03/2019   Procedure: VISCERAL ANGIOGRAPHY;  Surgeon: Katha Cabal, MD;  Location: [facility];  Service: Cardiovascular;  Laterality: N/A;    Family History  Problem Relation Age of Onset   Prostate cancer Neg Hx    Kidney cancer Neg Hx    Breast cancer Neg Hx     Social History:  reports that she quit smoking about 67 years ago. Her smoking use included cigarettes. She started smoking about 68 years ago. She smoked an average of .5 packs per day. She has never used smokeless tobacco. She reports that she does not currently use alcohol. She reports that she does not currently use drugs.  Allergies:  Allergies  Allergen Reactions   Levofloxacin Other (See Comments)    Hallucinations     Medications: I have reviewed the patient's current medications. Prior to Admission:  Medications Prior to  Admission  Medication Sig Dispense Refill Last Dose   amikacin (AMIKIN) 1 GM/4ML SOLN injection Inject 825 mg into the vein 3 (three) times a week. Tue, Thurs, and Sat   05/02/2022   amitriptyline (ELAVIL) 25 MG tablet Take 25 mg by mouth at bedtime.   05/02/2022   cefOXitin (MEFOXIN) 2 g injection Inject 2 g into the vein in the morning, at noon, and at bedtime.   05/02/2022   Cholecalciferol (VITAMIN D3 PO) Take 1 capsule by mouth daily.   05/02/2022   CLOFAZIMINE PO Take 100 mg by mouth daily.   05/02/2022   desvenlafaxine (PRISTIQ) 50 MG 24 hr tablet Take 50 mg by mouth daily.   05/02/2022   furosemide (LASIX) 20 MG tablet Take 20 mg by mouth daily.   05/02/2022   imipenem-cilastatin (PRIMAXIN) 500 MG injection  Inject 500 mg into the vein 3 (three) times daily.   05/02/2022   insulin aspart (NOVOLOG FLEXPEN) 100 UNIT/ML FlexPen Inject 4 Units into the skin daily.   05/02/2022   insulin glargine (LANTUS SOLOSTAR) 100 UNIT/ML Solostar Pen Inject 12 Units into the skin at bedtime. (Patient taking differently: Inject 10 Units into the skin at bedtime.) 15 mL 11 05/02/2022   levothyroxine (SYNTHROID) 100 MCG tablet Take 100 mcg by mouth every morning.   05/02/2022   linezolid (ZYVOX) 600 MG tablet Take 1 tablet (600 mg total) by mouth daily. 30 tablet 5 05/02/2022   metFORMIN (GLUCOPHAGE) 500 MG tablet Take 500 mg by mouth 2 (two) times daily with a meal.   05/02/2022   metoCLOPramide (REGLAN) 5 MG tablet Take 5 mg by mouth 2 (two) times daily.   05/02/2022   Multiple Vitamin (MULTIVITAMIN WITH MINERALS) TABS tablet Take 1 tablet by mouth daily. 30 tablet 0 05/02/2022   nystatin (MYCOSTATIN) 100000 UNIT/ML suspension Take 2-5 mLs by mouth daily as needed (thrush).   05/02/2022   traZODone (DESYREL) 50 MG tablet Take 50 mg by mouth at bedtime.   05/02/2022   acetaminophen (TYLENOL) 500 MG tablet Take 500-1,000 mg by mouth every 6 (six) hours as needed for moderate pain.   More than a month   albuterol (VENTOLIN HFA) 108 (90 Base) MCG/ACT inhaler Inhale 2 puffs into the lungs every 6 (six) hours as needed for wheezing or shortness of breath. 8 g 0 More than a month   cholecalciferol (VITAMIN D) 25 MCG (1000 UNIT) tablet Take 1 tablet (1,000 Units total) by mouth daily. (Patient not taking: Reported on 05/01/2022) 30 tablet 0 Not Taking   clopidogrel (PLAVIX) 75 MG tablet TAKE 1 TABLET BY MOUTH DAILY (Patient not taking: Reported on 05/01/2022) 90 tablet 0 Not Taking   hydrOXYzine (VISTARIL) 25 MG capsule Take 1 capsule (25 mg total) by mouth 3 (three) times daily as needed. (Patient not taking: Reported on 05/01/2022) 30 capsule 0 Not Taking   nitroGLYCERIN (NITROSTAT) 0.4 MG SL tablet Place 1 tablet (0.4 mg total) under the  tongue every 5 (five) minutes as needed for chest pain. 90 tablet 0 Unknown    ROS: Constitutional: No fever or chills Vision: No changes in vision ENT: No difficulty swallowing CV: No chest pain Pulm: No SOB or wheezing GI: No nausea or vomiting GU: No urgency or inability to hold urine Skin: No poor wound healing Neurologic: No numbness or tingling Psychiatric: No depression or anxiety Heme: No bruising Allergic: No reaction to medications or food   Exam: Blood pressure (!) 170/62, pulse 74, temperature 97.9 F (36.6  C), temperature source Oral, resp. rate 17, height '4\' 11"'$  (1.499 m), weight 61.2 kg, SpO2 95 %. General: No acute distress Orientation: Alert and oriented x3 Mood and Affect: Mood and affect appropriate, pleasant and cooperative Gait: Antalgic gait Coordination and balance: Within normal limits  Left lower extremity: Wound to the lateral ankle with active drainage.  Significant swelling about the ankle.  Tenderness with palpation about the ankle.  Ankle motion limited secondary to pain.  Endorses sensation to light touch over the dorsal and plantar aspect of the foot.  Able to wiggle toes.+ DP pulse  Right lower extremity: Skin without lesions. No tenderness to palpation. Full painless ROM, full strength in each muscle group without evidence of instability.  Motor and sensory function grossly intact.  Neurovascularly intact   Medical Decision Making: Data: Imaging: CT scan left tibia performed 04/12/2022 shows progressive bone destruction of the talar dome and neck. The distal tibial destruction has not significantly progressed. All hardware has been removed.  The subperiosteal fluid collection along the anterior aspect of the distal tibia has decreased some in size. No new or enlarging fluid collections identified.    Labs:  Results for orders placed or performed during the hospital encounter of 05/03/22 (from the past 168 hour(s))  Glucose, capillary    Collection Time: 05/03/22  5:39 AM  Result Value Ref Range   Glucose-Capillary 84 70 - 99 mg/dL  Basic metabolic panel per protocol   Collection Time: 05/03/22  5:46 AM  Result Value Ref Range   Sodium 139 135 - 145 mmol/L   Potassium 5.1 3.5 - 5.1 mmol/L   Chloride 103 98 - 111 mmol/L   CO2 26 22 - 32 mmol/L   Glucose, Bld 118 (H) 70 - 99 mg/dL   BUN 24 (H) 8 - 23 mg/dL   Creatinine, Ser 1.62 (H) 0.44 - 1.00 mg/dL   Calcium 9.6 8.9 - 10.3 mg/dL   GFR, Estimated 31 (L) >60 mL/min   Anion gap 10 5 - 15  CBC per protocol   Collection Time: 05/03/22  5:46 AM  Result Value Ref Range   WBC 13.0 (H) 4.0 - 10.5 K/uL   RBC 4.34 3.87 - 5.11 MIL/uL   Hemoglobin 10.8 (L) 12.0 - 15.0 g/dL   HCT 34.2 (L) 36.0 - 46.0 %   MCV 78.8 (L) 80.0 - 100.0 fL   MCH 24.9 (L) 26.0 - 34.0 pg   MCHC 31.6 30.0 - 36.0 g/dL   RDW 19.9 (H) 11.5 - 15.5 %   Platelets 261 150 - 400 K/uL   nRBC 0.0 0.0 - 0.2 %      Assessment/Plan: 88 year old female with continued drainage from left ankle wounds  Patient has been consistently on the last 4 months.  She continues to have drainage from her wounds.  Most recent CT scan shows progressive bony destruction of the talar dome and neck.  Due to concern for continued infection in these areas, would recommend proceeding with irrigation and debridement of the left ankle.  We will plan to obtain new intraoperative cultures.  We will plan to admit patient overnight for pain control and observation and likely discharge patient home on postoperative day #1.  Risk and benefits of procedure have been discussed with the patient.  She agrees to proceed with surgery.  Consent will be obtained.   Gwinda Passe PA-C [facility] [phone] (office) [email or web address]

## 2022-05-03 NOTE — Anesthesia Procedure Notes (Signed)
Procedure Name: LMA Insertion Date/Time: 05/03/2022 7:31 AM  Performed by: Reeves Dam, CRNAPre-anesthesia Checklist: Patient identified, Patient being monitored, Timeout performed, Emergency Drugs available and Suction available Patient Re-evaluated:Patient Re-evaluated prior to induction Oxygen Delivery Method: Circle system utilized Preoxygenation: Pre-oxygenation with 100% oxygen Induction Type: IV induction Ventilation: Mask ventilation without difficulty LMA: LMA inserted LMA Size: 4.0 Tube type: Oral Number of attempts: 1 Placement Confirmation: positive ETCO2 and breath sounds checked- equal and bilateral Tube secured with: Tape Dental Injury: Teeth and Oropharynx as per pre-operative assessment

## 2022-05-03 NOTE — Progress Notes (Signed)
PT Cancellation Note  Patient Details Name: Cassandra [name] MRN: [id number] DOB: 05/12/1933   Cancelled Treatment:    Reason Eval/Treat Not Completed: Pain limiting ability to participate;Other (comment). Pt reports she is awaiting IV pain medicine and per RN, the pt does not currently have IV access and is waiting on IV team consult. The pt also reports she would like to finish dinner prior to attempting mobility. Will continue to follow and evaluate as able.   [place] Car[place], PT, DPT   Acute Rehabilitation Department   Sandra Cockayne 05/03/2022, 4:37 PM

## 2022-05-03 NOTE — Progress Notes (Signed)
Pharmacy Antibiotic Note  Cassandra [name] is a 88 y.o. 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 Pharmacy has been consulted for amikacin dosing and resumption of outpatient M. Abscessus regimen. She is S/P I and D of LLE today with Dr. Doreatha Martin.   Plan: Adjust outpatient cefoxitin to 2 gm every 12 hours given CrCl 19 ml/min Continue Amikacin at 800 mg every TThSa (Adjusted 8/31 due to trough and renal function)  Continue Linezolid 600 mg daily  Continue Clofazimine 100 mg daily Will stop imipenem per Dr. Gale Journey     Height: '4\' 11"'$  (149.9 cm) Weight: 61.2 kg (135 lb) IBW/kg (Calculated) : 43.2  Temp (24hrs), Avg:97.9 F (36.6 C), Min:97.8 F (36.6 C), Max:97.9 F (36.6 C)  Recent Labs  Lab 05/03/22 0546  WBC 13.0*  CREATININE 1.62*    Estimated Creatinine Clearance: 19.5 mL/min (A) (by C-G formula based on SCr of 1.62 mg/dL (H)).    Allergies  Allergen Reactions   Levofloxacin Other (See Comments)    Hallucinations      Thank you for allowing pharmacy to be a part of this patient's care.  Jimmy Footman, PharmD, BCPS, BCIDP Infectious Diseases Clinical Pharmacist Phone: [phone] 05/03/2022 10:10 AM

## 2022-05-03 NOTE — Anesthesia Preprocedure Evaluation (Signed)
Anesthesia Evaluation  Patient identified by MRN, date of birth, ID band Patient awake    Reviewed: Allergy & Precautions, NPO status , Patient's Chart, lab work & pertinent test results  History of Anesthesia Complications Negative for: history of anesthetic complications  Airway Mallampati: III  TM Distance: >3 FB Neck ROM: Full    Dental  (+) Upper Dentures, Lower Dentures, Dental Advisory Given   Pulmonary sleep apnea , COPD, former smoker,    breath sounds clear to auscultation       Cardiovascular hypertension, Pt. on medications + CAD, + Past MI, + Cardiac Stents and + Peripheral Vascular Disease  + dysrhythmias + pacemaker  Rhythm:Regular  1. Left ventricular ejection fraction, by estimation, is 60 to 65%. The  left ventricle has normal function. The left ventricle has no regional  wall motion abnormalities. There is mild concentric left ventricular  hypertrophy. Left ventricular diastolic  parameters are consistent with Grade II diastolic dysfunction  (pseudonormalization). Elevated left atrial pressure. There is the  interventricular septum is flattened in diastole ('D' shaped left  ventricle), consistent with right ventricular volume  overload.  2. Right ventricular systolic function is normal. The right ventricular  size is mildly enlarged. There is moderately elevated pulmonary artery  systolic pressure.  3. Left atrial size was mildly dilated.  4. Right atrial size was mildly dilated.  5. The mitral valve is normal in structure. No evidence of mitral valve  regurgitation.  6. Tricuspid valve regurgitation is severe.  7. The aortic valve is tricuspid. There is mild calcification of the  aortic valve. There is mild thickening of the aortic valve. Aortic valve  regurgitation is not visualized. Mild aortic valve sclerosis is present,  with no evidence of aortic valve    Neuro/Psych  Headaches, PSYCHIATRIC  DISORDERS Anxiety Depression  Neuromuscular disease    GI/Hepatic negative GI ROS, Neg liver ROS,   Endo/Other  diabetes, Insulin DependentHypothyroidism Lab Results      Component                Value               Date                      HGBA1C                   6.6 (H)             01/29/2022             Renal/GU CRFRenal diseaseLab Results      Component                Value               Date                      CREATININE               1.62 (H)            05/03/2022           elevated     Musculoskeletal  (+) Arthritis ,   Abdominal   Peds  Hematology  (+) Blood dyscrasia, anemia , Lab Results      Component                Value  Date                      WBC                      13.0 (H)            05/03/2022                HGB                      10.8 (L)            05/03/2022                HCT                      34.2 (L)            05/03/2022                MCV                      78.8 (L)            05/03/2022                PLT                      261                 05/03/2022              Anesthesia Other Findings   Reproductive/Obstetrics                             Anesthesia Physical Anesthesia Plan  ASA: 3  Anesthesia Plan: General   Post-op Pain Management: Toradol IV (intra-op)*   Induction: Intravenous  PONV Risk Score and Plan: 3 and Ondansetron and Dexamethasone  Airway Management Planned: LMA  Additional Equipment: None  Intra-op Plan:   Post-operative Plan: Extubation in OR  Informed Consent: I have reviewed the patients History and Physical, chart, labs and discussed the procedure including the risks, benefits and alternatives for the proposed anesthesia with the patient or authorized representative who has indicated his/her understanding and acceptance.     Dental advisory given  Plan Discussed with: CRNA  Anesthesia Plan Comments:         Anesthesia Quick Evaluation

## 2022-05-03 NOTE — Transfer of Care (Signed)
Immediate Anesthesia Transfer of Care Note  Patient: Cassandra [name]  Procedure(s) Performed: DEBRIDEMENT OF LEFT ANKLE INFECTION (Left: Ankle)  Patient Location: PACU  Anesthesia Type:General  Level of Consciousness: awake, sedated and patient cooperative  Airway & Oxygen Therapy: Patient Spontanous Breathing and Patient connected to nasal cannula oxygen  Post-op Assessment: Report given to RN and Post -op Vital signs reviewed and stable  Post vital signs: Reviewed and stable  Last Vitals:  Vitals Value Taken Time  BP 173/85 05/03/22 0845  Temp    Pulse 82 05/03/22 0845  Resp 12 05/03/22 0845  SpO2 89 % 05/03/22 0845  Vitals shown include unvalidated device data.  Last Pain:  Vitals:   05/03/22 0615  TempSrc:   PainSc: 0-No pain         Complications: No notable events documented.

## 2022-05-03 NOTE — Plan of Care (Signed)
Patient followed by me for m-abscessus LLE om  Repeat imaging showed progression of disease/high burden so admitted for I&D  Ideal regimen couldn't be obtained due to cost with tigecycline/omadocycline  She is on amikacin, imipenem/cefoxitin, clofaz. Creatinine rising a bit    With this debridement, will do: Amikacin Cefoxitin Clofaz Linezolid  Inpatient ID pharmacy team will help with putting in medication   Called lab today to add on mycobacterial culture to the tissue sample   She has appointment in clinic with me on 9/6   Discussed with primary team
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
## 2022-05-03 NOTE — Discharge Instructions (Signed)
Orthopaedic Trauma Service Discharge Instructions   General Discharge Instructions  WEIGHT BEARING STATUS: Weightbearing as tolerated  RANGE OF MOTION/ACTIVITY: Ok for ankle range of motion as tolerated  Wound Care: You may remove your surgical dressing on Sunday 05/05/22. If incision continues to have drainage, follow wound care instructions below. Okay to shower if no drainage from incisions.  DVT/PE prophylaxis: Aspirin  Diet: as you were eating previously.  Can use over the counter stool softeners and bowel preparations, such as Miralax, to help with bowel movements.  Narcotics can be constipating.  Be sure to drink plenty of fluids  PAIN MEDICATION USE AND EXPECTATIONS  You have likely been given narcotic medications to help control your pain.  After a traumatic event that results in an fracture (broken bone) with or without surgery, it is ok to use narcotic pain medications to help control one's pain.  We understand that everyone responds to pain differently and each individual patient will be evaluated on a regular basis for the continued need for narcotic medications. Ideally, narcotic medication use should last no more than 6-8 weeks (coinciding with fracture healing).   As a patient it is your responsibility as well to monitor narcotic medication use and report the amount and frequency you use these medications when you come to your office visit.   We would also advise that if you are using narcotic medications, you should take a dose prior to therapy to maximize you participation.  IF YOU ARE ON NARCOTIC MEDICATIONS IT IS NOT PERMISSIBLE TO OPERATE A MOTOR VEHICLE (MOTORCYCLE/CAR/TRUCK/MOPED) OR HEAVY MACHINERY DO NOT MIX NARCOTICS WITH OTHER CNS (CENTRAL NERVOUS SYSTEM) DEPRESSANTS SUCH AS ALCOHOL   STOP SMOKING OR USING NICOTINE PRODUCTS!!!!  As discussed nicotine severely impairs your body's ability to heal surgical and traumatic wounds but also impairs bone healing.  Wounds  and bone heal by forming microscopic blood vessels (angiogenesis) and nicotine is a vasoconstrictor (essentially, shrinks blood vessels).  Therefore, if vasoconstriction occurs to these microscopic blood vessels they essentially disappear and are unable to deliver necessary nutrients to the healing tissue.  This is one modifiable factor that you can do to dramatically increase your chances of healing your injury.    (This means no smoking, no nicotine gum, patches, etc)  DO NOT USE NONSTEROIDAL ANTI-INFLAMMATORY DRUGS (NSAID'S)  Using products such as Advil (ibuprofen), Aleve (naproxen), Motrin (ibuprofen) for additional pain control during fracture healing can delay and/or prevent the healing response.  If you would like to take over the counter (OTC) medication, Tylenol (acetaminophen) is ok.  However, some narcotic medications that are given for pain control contain acetaminophen as well. Therefore, you should not exceed more than 4000 mg of tylenol in a day if you do not have liver disease.  Also note that there are may OTC medicines, such as cold medicines and allergy medicines that my contain tylenol as well.  If you have any questions about medications and/or interactions please ask your doctor/PA or your pharmacist.      ICE AND ELEVATE INJURED/OPERATIVE EXTREMITY  Using ice and elevating the injured extremity above your heart can help with swelling and pain control.  Icing in a pulsatile fashion, such as 20 minutes on and 20 minutes off, can be followed.    Do not place ice directly on skin. Make sure there is a barrier between to skin and the ice pack.    Using frozen items such as frozen peas works well as the conform nicely to  the are that needs to be iced.  USE AN ACE WRAP OR TED HOSE FOR SWELLING CONTROL  In addition to icing and elevation, Ace wraps or TED hose are used to help limit and resolve swelling.  It is recommended to use Ace wraps or TED hose until you are informed to stop.     When using Ace Wraps start the wrapping distally (farthest away from the body) and wrap proximally (closer to the body)   Example: If you had surgery on your leg or thing and you do not have a splint on, start the ace wrap at the toes and work your way up to the thigh        If you had surgery on your upper extremity and do not have a splint on, start the ace wrap at your fingers and work your way up to the upper arm  [place]: [phone]   VISIT OUR WEBSITE FOR ADDITIONAL INFORMATION: [email or web address]     Discharge Wound Care Instructions  Do NOT apply any ointments, solutions or lotions to pin sites or surgical wounds.  These prevent needed drainage and even though solutions like hydrogen peroxide kill bacteria, they also damage cells lining the pin sites that help fight infection.  Applying lotions or ointments can keep the wounds moist and can cause them to breakdown and open up as well. This can increase the risk for infection. When in doubt call the office.  Surgical incisions should be dressed daily.  If any drainage is noted, use one layer of adaptic or Mepitel, then gauze, Kerlix, and an ace wrap. - These dressing supplies should be available at local medical supply stores [facility], [facility], etc) as well as Management consultant ([facility], [facility], [place], etc)  Once the incision is completely dry and without drainage, it may be left open to air out.  Showering may begin 36-48 hours later.  Cleaning gently with soap and water.

## 2022-05-03 NOTE — Interval H&P Note (Signed)
History and Physical Interval Note:  05/03/2022 7:13 AM  Cassandra [name]  has presented today for surgery, with the diagnosis of Left ankle osteomyelitis.  The various methods of treatment have been discussed with the patient and family. After consideration of risks, benefits and other options for treatment, the patient has consented to  Procedure(s): IRRIGATION AND DEBRIDEMENT ANKLE (Left) as a surgical intervention.  The patient's history has been reviewed, patient examined, no change in status, stable for surgery.  I have reviewed the patient's chart and labs.  Questions were answered to the patient's satisfaction.     Lennette Bihari P Jimmy Plessinger

## 2022-05-03 NOTE — Plan of Care (Signed)
  Problem: Activity: Goal: Risk for activity intolerance will decrease Outcome: Progressing   

## 2022-05-04 DIAGNOSIS — M868X6 Other osteomyelitis, lower leg: Secondary | ICD-10-CM | POA: Diagnosis not present

## 2022-05-04 LAB — BASIC METABOLIC PANEL
Anion gap: 8 (ref 5–15)
BUN: 28 mg/dL — ABNORMAL HIGH (ref 8–23)
CO2: 25 mmol/L (ref 22–32)
Calcium: 8.5 mg/dL — ABNORMAL LOW (ref 8.9–10.3)
Chloride: 105 mmol/L (ref 98–111)
Creatinine, Ser: 1.97 mg/dL — ABNORMAL HIGH (ref 0.44–1.00)
GFR, Estimated: 24 mL/min — ABNORMAL LOW (ref >60)
Glucose, Bld: 149 mg/dL — ABNORMAL HIGH (ref 70–99)
Potassium: 4.5 mmol/L (ref 3.5–5.1)
Sodium: 138 mmol/L (ref 135–145)

## 2022-05-04 LAB — CBC
HCT: 27.6 % — ABNORMAL LOW (ref 36.0–46.0)
Hemoglobin: 8.9 g/dL — ABNORMAL LOW (ref 12.0–15.0)
MCH: 24.9 pg — ABNORMAL LOW (ref 26.0–34.0)
MCHC: 32.2 g/dL (ref 30.0–36.0)
MCV: 77.1 fL — ABNORMAL LOW (ref 80.0–100.0)
Platelets: 223 10*3/uL (ref 150–400)
RBC: 3.58 MIL/uL — ABNORMAL LOW (ref 3.87–5.11)
RDW: 19.6 % — ABNORMAL HIGH (ref 11.5–15.5)
WBC: 9.5 10*3/uL (ref 4.0–10.5)
nRBC: 0 % (ref 0.0–0.2)

## 2022-05-04 LAB — GLUCOSE, CAPILLARY
Glucose-Capillary: 106 mg/dL — ABNORMAL HIGH (ref 70–99)
Glucose-Capillary: 245 mg/dL — ABNORMAL HIGH (ref 70–99)
Glucose-Capillary: 246 mg/dL — ABNORMAL HIGH (ref 70–99)

## 2022-05-04 NOTE — Progress Notes (Signed)
Nursing Discharge Note   Admit Date:9/1/20223  Discharge date:05/04/2022@ 12:30pm   PT  to be discharged home per MD order.  AVS completed. Reviewed with patient and family at bedside. Highlighted copy provided for patient to take home.  Patient/caregiver able to verbalize understanding of discharge instructions. PIV removed. Patient stable upon discharge.   Discharge Instructions     [facility] pharmacist to adjust dose for Vancomycin, Aminoglycosides and other anti-infective therapies as requested by physician.   Complete by: As directed    [facility] to provide Cath Flo 54m   Complete by: As directed    Administer for PICC line occlusion and as ordered by physician for other access device issues.   Anaphylaxis Kit: Provided to treat any anaphylactic reaction to the medication being provided to the patient if First Dose or when requested by physician   Complete by: As directed    Epinephrine 137mml vial / amp: Administer 0.62m68m0.62ml49mubcutaneously once for moderate to severe anaphylaxis, nurse to call physician and pharmacy when reaction occurs and call 911 if needed for immediate care   Diphenhydramine 50mg75mIV vial: Administer 25-50mg 32mM PRN for first dose reaction, rash, itching, mild reaction, nurse to call physician and pharmacy when reaction occurs   Sodium Chloride 0.9% NS 500ml I48mdminister if needed for hypovolemic blood pressure drop or as ordered by physician after call to physician with anaphylactic reaction   Call MD / Call 911   Complete by: As directed    If you experience chest pain or shortness of breath, CALL 911 and be transported to the hospital emergency room.  If you develope a fever above 101 F, pus (white drainage) or increased drainage or redness at the wound, or calf pain, call your surgeon's office.   Change dressing on IV access line weekly and PRN   Complete by: As directed    Constipation Prevention   Complete by: As directed     Drink plenty of fluids.  Prune juice may be helpful.  You may use a stool softener, such as Colace (over the counter) 100 mg twice a day.  Use MiraLax (over the counter) for constipation as needed.   Diet - low sodium heart healthy   Complete by: As directed    Flush IV access with Sodium Chloride 0.9% and Heparin 10 units/ml or 100 units/ml   Complete by: As directed    Home infusion instructions - [facility]   Complete by: As directed    Instructions: Flush IV access with Sodium Chloride 0.9% and Heparin 10units/ml or 100units/ml   Change dressing on IV access line: Weekly and PRN   Instructions Cath Flo 2mg: Ad39mister for PICC Line occlusion and as ordered by physician for other access device   [facility] pharmacist to adjust dose for: Vancomycin, Aminoglycosides and other anti-infective therapies as requested by physician   Increase activity slowly as tolerated   Complete by: As directed    Method of administration may be changed at the discretion of home infusion pharmacist based upon assessment of the patient and/or caregiver's ability to self-administer the medication ordered   Complete by: As directed    Outpatient Parenteral Antibiotic Therapy Information Antibiotic: Amikacin IVPB; Indications for use: mycobacterium abscessus osteomyelitis; End Date: 09/01/2022   Complete by: As directed    Antibiotic: Amikacin IVPB   Indications for use: mycobacterium abscessus osteomyelitis   End Date: 09/01/2022   Post-operative opioid taper instructions:   Complete by:  As directed    POST-OPERATIVE OPIOID TAPER INSTRUCTIONS: It is important to wean off of your opioid medication as soon as possible. If you do not need pain medication after your surgery it is ok to stop day one. Opioids include: Codeine, Hydrocodone(Norco, Vicodin), Oxycodone(Percocet, oxycontin) and hydromorphone amongst others.  Long term and even short term use of opiods can cause: Increased pain  response Dependence Constipation Depression Respiratory depression And more.  Withdrawal symptoms can include Flu like symptoms Nausea, vomiting And more Techniques to manage these symptoms Hydrate well Eat regular healthy meals Stay active Use relaxation techniques(deep breathing, meditating, yoga) Do Not substitute Alcohol to help with tapering If you have been on opioids for less than two weeks and do not have pain than it is ok to stop all together.  Plan to wean off of opioids This plan should start within one week post op of your joint replacement. Maintain the same interval or time between taking each dose and first decrease the dose.  Cut the total daily intake of opioids by one tablet each day Next start to increase the time between doses. The last dose that should be eliminated is the evening dose.            Allergies as of 05/04/2022       Reactions   Levofloxacin Other (See Comments)   Hallucinations         Medication List     STOP taking these medications    amikacin 1 GM/4ML Soln injection Commonly known as: AMIKIN   cefOXitin 2 g injection Commonly known as: MEFOXIN   clopidogrel 75 MG tablet Commonly known as: PLAVIX   hydrOXYzine 25 MG capsule Commonly known as: VISTARIL   imipenem-cilastatin 500 MG injection Commonly known as: PRIMAXIN   metFORMIN 500 MG tablet Commonly known as: GLUCOPHAGE       TAKE these medications    acetaminophen 500 MG tablet Commonly known as: TYLENOL Take 500-1,000 mg by mouth every 6 (six) hours as needed for moderate pain.   albuterol 108 (90 Base) MCG/ACT inhaler Commonly known as: VENTOLIN HFA Inhale 2 puffs into the lungs every 6 (six) hours as needed for wheezing or shortness of breath.   amitriptyline 25 MG tablet Commonly known as: ELAVIL Take 25 mg by mouth at bedtime.   cefOXitin 2 g in dextrose 5 % 50 mL Inject 2 g into the vein every 12 (twelve) hours. Indication:  Mycobacterium  abscessus osteomyelitis  First Dose: Yes Last Day of Therapy:  09/01/2022 Labs - Sunday/Monday:  CBC/D, BMP, and amikacin trough. Labs - Thursday:  BMP  Labs - Every other week:  ESR and CRP Method of administration: IV Push  Method of administration may be changed at the discretion of home infusion pharmacist based upon assessment of the patient and/or caregiver's ability to self-administer the medication ordered.   CLOFAZIMINE PO Take 100 mg by mouth daily.   desvenlafaxine 50 MG 24 hr tablet Commonly known as: PRISTIQ Take 50 mg by mouth daily.   furosemide 20 MG tablet Commonly known as: LASIX Take 20 mg by mouth daily.   HYDROcodone-acetaminophen 5-325 MG tablet Commonly known as: NORCO/VICODIN Take 1-2 tablets by mouth every 4 (four) hours as needed for severe pain.   Lantus SoloStar 100 UNIT/ML Solostar Pen Generic drug: insulin glargine Inject 12 Units into the skin at bedtime. What changed: how much to take   levothyroxine 100 MCG tablet Commonly known as: SYNTHROID Take 100 mcg by mouth  every morning.   linezolid 600 MG tablet Commonly known as: ZYVOX Take 1 tablet (600 mg total) by mouth daily.   metoCLOPramide 5 MG tablet Commonly known as: REGLAN Take 5 mg by mouth 2 (two) times daily.   multivitamin with minerals Tabs tablet Take 1 tablet by mouth daily.   nitroGLYCERIN 0.4 MG SL tablet Commonly known as: NITROSTAT Place 1 tablet (0.4 mg total) under the tongue every 5 (five) minutes as needed for chest pain.   NovoLOG FlexPen 100 UNIT/ML FlexPen Generic drug: insulin aspart Inject 4 Units into the skin daily.   nystatin 100000 UNIT/ML suspension Commonly known as: MYCOSTATIN Take 2-5 mLs by mouth daily as needed (thrush).   traZODone 50 MG tablet Commonly known as: DESYREL Take 50 mg by mouth at bedtime.   VITAMIN D3 PO Take 1 capsule by mouth daily. What changed: Another medication with the same name was removed. Continue taking this  medication, and follow the directions you see here.               Discharge Care Instructions  (From admission, onward)           Start     Ordered   05/03/22 0000  Change dressing on IV access line weekly and PRN  (Home infusion instructions - [facility] )        05/03/22 1439             Discharge Instructions/ Education: Discharge instructions given to patient/husband with verbalized understanding. Discharge education completed with patient/family including: follow up instructions, medication list, discharge activities, and limitations if indicated. Additional discharge instructions as indicated by discharging provider also reviewed. Patient and husband able to verbalize understanding, all questions fully answered. Patient instructed to return to Emergency Department, call 911, or call MD for any changes in condition.  Patient escorted via wheelchair to lobby and discharged home via private automobile.

## 2022-05-04 NOTE — Progress Notes (Signed)
PHARMACY CONSULT NOTE FOR:  OUTPATIENT  PARENTERAL ANTIBIOTIC THERAPY (OPAT)  Indication: mycobacterium abscesses LLE osteomyelitis Regimen:  Hold amikacin for now until level is back per Dr Gale Journey.  Adjunctive oral meds: Linezolid 600 mg daily AND Clofazamine 100 mg daily  End date: Indefinite (stop date entered as 09/01/22)  IV antibiotic discharge orders are pended. To discharging provider:  please sign these orders via discharge navigator,  Select New Orders & click on the button choice - Manage This Unsigned Work.    Onnie Boer, PharmD, BCIDP, AAHIVP, CPP Infectious Disease Pharmacist 05/04/2022 12:09 PM

## 2022-05-04 NOTE — Care Management Obs Status (Signed)
[facility] NOTIFICATION   Patient Details  Name: Cassandra [name] MRN: [id number] Date of Birth: [birth year]   Medicare Observation Status Notification Given:  Yes    Norina Buzzard, RN 05/04/2022, 9:06 AM

## 2022-05-04 NOTE — Progress Notes (Addendum)
Due to her worsening scr, ok to hold amikacin for now. We will get level and clinic will re-assess before restarting per Dr. Gale Journey. Would resume if level is undetectable and scr improves.    Addendum  Ok to hold metformin for same issue as above per Dr. Robyn Haber, PharmD, BCIDP, AAHIVP, CPP Infectious Disease Pharmacist 05/04/2022 12:11 PM

## 2022-05-04 NOTE — Discharge Summary (Signed)
Physician Discharge Summary  Patient ID: Cassandra [name] MRN: [id number] DOB/AGE: August 17, 1933 88 y.o.  Admit date: 05/03/2022 Discharge date: 05/04/2022  Admission Diagnoses:  Wound infection  Discharge Diagnoses:  Principal Problem:   Wound infection   Past Medical History:  Diagnosis Date   Acquired hypothyroidism 01/06/2014   Anxiety    Arthritis    B12 deficiency 02/15/2014   Benign essential hypertension 01/06/2014   Breast cancer ([place]) 1982   Right breast cancer - chemotherapy   CAD (coronary artery disease), native coronary artery 01/06/2014   Carotid artery calcification    Chronic airway obstruction, not elsewhere classified 01/06/2014   Chronic mesenteric ischemia (HCC)    s/p SMA stent 11/03/19   Depression    Diabetes mellitus without complication ([place])    Dysrhythmia    aflutter with RVR 06/2016   History of kidney stones 2019   left ureteral stone   Incontinence of urine    Myocardial infarction ([place]) 1982   small infarct   Neuromuscular disorder (HCC)    restless legs   Pacemaker    Presence of permanent cardiac pacemaker 07/2016   Pure hypercholesterolemia 01/06/2014   Renal insufficiency    Skin cancer    Sleep apnea    no longer uses a cpap. Uses oxygen as needed    Surgeries: Procedure(s): DEBRIDEMENT OF LEFT ANKLE INFECTION on 05/03/2022   Consultants (if any):   Discharged Condition: Improved  Hospital Course: Cassandra [name] is an 88 y.o. female who was admitted 05/03/2022 with a diagnosis of Wound infection and went to the operating room on 05/03/2022 and underwent the above named procedures.    She was given perioperative antibiotics:  Anti-infectives (From admission, onward)    Start     Dose/Rate Route Frequency Ordered Stop   05/04/22 1800  amikacin (AMIKIN) 800 mg in dextrose 5 % 100 mL IVPB  Status:  Discontinued        800 mg 103.2 mL/hr over 60 Minutes Intravenous Every T-Th-Sa (1800) 05/03/22 1005 05/04/22 1206   05/04/22 0000   amikacin (AMIKIN) IVPB  Status:  Discontinued        800 mg Intravenous Every T-Th-Sa (1800) 05/03/22 1439 05/03/22    05/04/22 0000  amikacin (AMIKIN) IVPB  Status:  Discontinued        800 mg Intravenous Every T-Th-Sa (1800) 05/03/22 1709 05/04/22    05/03/22 1600  cefOXitin (MEFOXIN) injection 2 g  Status:  Discontinued        2 g Intravenous 3 times daily 05/03/22 1156 05/03/22 1251   05/03/22 1600  imipenem-cilastatin (PRIMAXIN) injection 500 mg  Status:  Discontinued        500 mg Intravenous 3 times daily 05/03/22 1156 05/03/22 1251   05/03/22 1400  cefOXitin (MEFOXIN) 2 g in sodium chloride 0.9 % 100 mL IVPB        2 g 200 mL/hr over 30 Minutes Intravenous Every 12 hours 05/03/22 1005     05/03/22 1400  linezolid (ZYVOX) tablet 600 mg        600 mg Oral Daily 05/03/22 1005     05/03/22 1245  amikacin (AMIKIN) injection 825 mg  Status:  Discontinued        825 mg Intravenous Once per day on Mon Wed Fri 05/03/22 1156 05/03/22 1251   05/03/22 1245  linezolid (ZYVOX) tablet 600 mg  Status:  Discontinued        600 mg Oral Daily 05/03/22 1156 05/03/22 1252  05/03/22 0808  tobramycin (NEBCIN) powder  Status:  Discontinued          As needed 05/03/22 0810 05/03/22 0835   05/03/22 0749  vancomycin (VANCOCIN) powder  Status:  Discontinued          As needed 05/03/22 0749 05/03/22 0835   05/03/22 0000  cefOXitin 2 g in dextrose 5 % 50 mL  Status:  Discontinued        2 g Intravenous Every 12 hours 05/03/22 1439 05/03/22    05/03/22 0000  cefOXitin 2 g in dextrose 5 % 50 mL        2 g Intravenous Every 12 hours 05/03/22 1709 09/01/22 2359     .  She was given sequential compression devices, early ambulation for DVT prophylaxis.  She benefited maximally from the hospital stay and there were no complications.    Recent vital signs:  Vitals:   05/04/22 0455 05/04/22 1127  BP: (!) 145/62 (!) 133/49  Pulse: 67 62  Resp: 17 18  Temp: 98.2 F (36.8 C) 97.8 F (36.6 C)  SpO2:  93%     Recent laboratory studies:  Lab Results  Component Value Date   HGB 8.9 (L) 05/04/2022   HGB 10.8 (L) 05/03/2022   HGB 9.0 (L) 02/01/2022   Lab Results  Component Value Date   WBC 9.5 05/04/2022   PLT 223 05/04/2022   Lab Results  Component Value Date   INR 1.2 02/26/2021   Lab Results  Component Value Date   NA 138 05/04/2022   K 4.5 05/04/2022   CL 105 05/04/2022   CO2 25 05/04/2022   BUN 28 (H) 05/04/2022   CREATININE 1.97 (H) 05/04/2022   GLUCOSE 149 (H) 05/04/2022    Discharge Medications:   Allergies as of 05/04/2022       Reactions   Levofloxacin Other (See Comments)   Hallucinations         Medication List     STOP taking these medications    amikacin 1 GM/4ML Soln injection Commonly known as: AMIKIN   cefOXitin 2 g injection Commonly known as: MEFOXIN   clopidogrel 75 MG tablet Commonly known as: PLAVIX   hydrOXYzine 25 MG capsule Commonly known as: VISTARIL   imipenem-cilastatin 500 MG injection Commonly known as: PRIMAXIN   metFORMIN 500 MG tablet Commonly known as: GLUCOPHAGE       TAKE these medications    acetaminophen 500 MG tablet Commonly known as: TYLENOL Take 500-1,000 mg by mouth every 6 (six) hours as needed for moderate pain.   albuterol 108 (90 Base) MCG/ACT inhaler Commonly known as: VENTOLIN HFA Inhale 2 puffs into the lungs every 6 (six) hours as needed for wheezing or shortness of breath.   amitriptyline 25 MG tablet Commonly known as: ELAVIL Take 25 mg by mouth at bedtime.   cefOXitin 2 g in dextrose 5 % 50 mL Inject 2 g into the vein every 12 (twelve) hours. Indication:  Mycobacterium abscessus osteomyelitis  First Dose: Yes Last Day of Therapy:  09/01/2022 Labs - Sunday/Monday:  CBC/D, BMP, and amikacin trough. Labs - Thursday:  BMP  Labs - Every other week:  ESR and CRP Method of administration: IV Push  Method of administration may be changed at the discretion of home infusion pharmacist based  upon assessment of the patient and/or caregiver's ability to self-administer the medication ordered.   CLOFAZIMINE PO Take 100 mg by mouth daily.   desvenlafaxine 50 MG 24 hr tablet  Commonly known as: PRISTIQ Take 50 mg by mouth daily.   furosemide 20 MG tablet Commonly known as: LASIX Take 20 mg by mouth daily.   HYDROcodone-acetaminophen 5-325 MG tablet Commonly known as: NORCO/VICODIN Take 1-2 tablets by mouth every 4 (four) hours as needed for severe pain.   Lantus SoloStar 100 UNIT/ML Solostar Pen Generic drug: insulin glargine Inject 12 Units into the skin at bedtime. What changed: how much to take   levothyroxine 100 MCG tablet Commonly known as: SYNTHROID Take 100 mcg by mouth every morning.   linezolid 600 MG tablet Commonly known as: ZYVOX Take 1 tablet (600 mg total) by mouth daily.   metoCLOPramide 5 MG tablet Commonly known as: REGLAN Take 5 mg by mouth 2 (two) times daily.   multivitamin with minerals Tabs tablet Take 1 tablet by mouth daily.   nitroGLYCERIN 0.4 MG SL tablet Commonly known as: NITROSTAT Place 1 tablet (0.4 mg total) under the tongue every 5 (five) minutes as needed for chest pain.   NovoLOG FlexPen 100 UNIT/ML FlexPen Generic drug: insulin aspart Inject 4 Units into the skin daily.   nystatin 100000 UNIT/ML suspension Commonly known as: MYCOSTATIN Take 2-5 mLs by mouth daily as needed (thrush).   traZODone 50 MG tablet Commonly known as: DESYREL Take 50 mg by mouth at bedtime.   VITAMIN D3 PO Take 1 capsule by mouth daily. What changed: Another medication with the same name was removed. Continue taking this medication, and follow the directions you see here.               Discharge Care Instructions  (From admission, onward)           Start     Ordered   05/03/22 0000  Change dressing on IV access line weekly and PRN  (Home infusion instructions - [facility] )        05/03/22 1439             Diagnostic Studies: DG Ankle Complete Left  Result Date: 05/03/2022 CLINICAL DATA:  I & D LEFT ankle EXAM: LEFT ANKLE COMPLETE - 3+ VIEW COMPARISON:  CT LEFT ankle 04/12/2022 Fluoroscopy time: 0 minutes 12 seconds Dose: 0.15 mGy Images: 2 FINDINGS: Bone destruction identified at both sides of the tibiotalar joint, distal tibia and distal fibula. Lucencies from prior pin tracks. Periosteal new bone distal tibia. Old distal tibial fracture is suboptimally delineated. No gross acute fracture or dislocation identified. IMPRESSION: Posttraumatic and destructive changes at the LEFT ankle as above. Electronically Signed   By: Lavonia Dana M.D.   On: 05/03/2022 10:58   DG C-Arm 1-60 Min-No Report  Result Date: 05/03/2022 Fluoroscopy was utilized by the requesting physician.  No radiographic interpretation.   DG Ankle Complete Left  Result Date: 05/03/2022 CLINICAL DATA:  Status post debridement of left ankle infection. EXAM: LEFT ANKLE COMPLETE - 3+ VIEW COMPARISON:  Intraoperative views same date. CT 04/12/2022 and radiographs 10/10/2021. FINDINGS: Chronic destruction of the tibial plafond and talar dome again noted. There is a new antibiotic cement spacer within the distal tibial metaphysis. Chronic postsurgical changes and periosteal reaction within the distal fibula and calcaneus are unchanged. No evidence of acute fracture or dislocation. IMPRESSION: Postsurgical changes related to placement of an antibiotic cement spacer within the distal tibial metaphysis. No evidence of complication or other significant change identified. Electronically Signed   By: Richardean Sale M.D.   On: 05/03/2022 10:02   CT TIBIA FIBULA LEFT W CONTRAST  Result Date: 04/15/2022 CLINICAL DATA:  Chronic osteomyelitis since open ankle fracture sustained accident 14 months ago. Ankle surgery at that time. History of diabetes. EXAM: CT OF THE LOWER LEFT EXTREMITY WITH CONTRAST TECHNIQUE: Multidetector CT imaging of the left lower  leg was performed according to the standard protocol following intravenous contrast administration. RADIATION DOSE REDUCTION: This exam was performed according to the departmental dose-optimization program which includes automated exposure control, adjustment of the mA and/or kV according to patient size and/or use of iterative reconstruction technique. CONTRAST:  76m OMNIPAQUE IOHEXOL 300 MG/ML  SOLN COMPARISON:  Left ankle CT 12/10/2021 and 11/13/2021. FINDINGS: Bones/Joint/Cartilage The bones remain diffusely demineralized. Stable sequela of removed hardware from the distal fibula and tibia with additional screw tracts from external fixation in the proximal tibia. The oblique fracture of the distal fibular diaphysis is healed with bridging bone anteriorly and medially. No fibular bone destruction identified. Diffuse osseous destruction and fragmentation of the tibial plafond and medial malleolus have not significantly progressed compared with prior examination. There is associated periosteal bone. There is progressive osseous destruction of the talar dome and neck without pathologic fracture. The subtalar joint and additional visualized tarsal bones appear unchanged, without bone destruction. Mild tricompartmental degenerative changes are noted at the knee with associated intra-articular loose bodies in a Baker's cyst. Ligaments Suboptimally assessed by CT. Muscles and Tendons Mild generalized muscular atrophy. As evaluated by CT, the ankle tendons appear intact without entrapment. No suspicious muscular enhancement. Soft tissues Previously demonstrated lateral soft tissue ulceration distally in the lower leg has improved. Previously demonstrated periosteal fluid extending proximally along the anterior aspect of the distal tibia has improved in the interval, and no new or enlarging periarticular fluid collections are identified. No acute vascular findings are evident. Mild atherosclerosis noted. IMPRESSION: 1.  Compared with the most recent CT of 4 months ago, there is progressive bone destruction of the talar dome and neck. The distal tibial destruction has not significantly progressed. 2. The surrounding inflammatory changes have mildly improved with a decreased subperiosteal fluid collection along the anterior aspect of the distal tibia. No new or enlarging fluid collections are identified. 3. No acute findings within the proximal lower leg. Tricompartmental degenerative changes at the knee with loose bodies in a Baker's cyst. Electronically Signed   By: WRichardean SaleM.D.   On: 04/15/2022 09:55    Disposition: Discharge disposition: 01-Home or Self Care       Discharge Instructions     [facility] pharmacist to adjust dose for Vancomycin, Aminoglycosides and other anti-infective therapies as requested by physician.   Complete by: As directed    [facility] to provide Cath Flo 255m  Complete by: As directed    Administer for PICC line occlusion and as ordered by physician for other access device issues.   Anaphylaxis Kit: Provided to treat any anaphylactic reaction to the medication being provided to the patient if First Dose or when requested by physician   Complete by: As directed    Epinephrine 24m57ml vial / amp: Administer 0.3mg64m.3ml)424mbcutaneously once for moderate to severe anaphylaxis, nurse to call physician and pharmacy when reaction occurs and call 911 if needed for immediate care   Diphenhydramine 50mg/30mV vial: Administer 25-50mg I43m PRN for first dose reaction, rash, itching, mild reaction, nurse to call physician and pharmacy when reaction occurs   Sodium Chloride 0.9% NS 500ml IV17mminister if needed for hypovolemic blood pressure drop or as ordered by physician  after call to physician with anaphylactic reaction   Call MD / Call 911   Complete by: As directed    If you experience chest pain or shortness of breath, CALL 911 and be transported to the  hospital emergency room.  If you develope a fever above 101 F, pus (white drainage) or increased drainage or redness at the wound, or calf pain, call your surgeon's office.   Change dressing on IV access line weekly and PRN   Complete by: As directed    Constipation Prevention   Complete by: As directed    Drink plenty of fluids.  Prune juice may be helpful.  You may use a stool softener, such as Colace (over the counter) 100 mg twice a day.  Use MiraLax (over the counter) for constipation as needed.   Diet - low sodium heart healthy   Complete by: As directed    Flush IV access with Sodium Chloride 0.9% and Heparin 10 units/ml or 100 units/ml   Complete by: As directed    Home infusion instructions - [facility]   Complete by: As directed    Instructions: Flush IV access with Sodium Chloride 0.9% and Heparin 10units/ml or 100units/ml   Change dressing on IV access line: Weekly and PRN   Instructions Cath Flo 75m: Administer for PICC Line occlusion and as ordered by physician for other access device   [facility] pharmacist to adjust dose for: Vancomycin, Aminoglycosides and other anti-infective therapies as requested by physician   Increase activity slowly as tolerated   Complete by: As directed    Method of administration may be changed at the discretion of home infusion pharmacist based upon assessment of the patient and/or caregiver's ability to self-administer the medication ordered   Complete by: As directed    Outpatient Parenteral Antibiotic Therapy Information Antibiotic: Amikacin IVPB; Indications for use: mycobacterium abscessus osteomyelitis; End Date: 09/01/2022   Complete by: As directed    Antibiotic: Amikacin IVPB   Indications for use: mycobacterium abscessus osteomyelitis   End Date: 09/01/2022   Post-operative opioid taper instructions:   Complete by: As directed    POST-OPERATIVE OPIOID TAPER INSTRUCTIONS: It is important to wean off of your  opioid medication as soon as possible. If you do not need pain medication after your surgery it is ok to stop day one. Opioids include: Codeine, Hydrocodone(Norco, Vicodin), Oxycodone(Percocet, oxycontin) and hydromorphone amongst others.  Long term and even short term use of opiods can cause: Increased pain response Dependence Constipation Depression Respiratory depression And more.  Withdrawal symptoms can include Flu like symptoms Nausea, vomiting And more Techniques to manage these symptoms Hydrate well Eat regular healthy meals Stay active Use relaxation techniques(deep breathing, meditating, yoga) Do Not substitute Alcohol to help with tapering If you have been on opioids for less than two weeks and do not have pain than it is ok to stop all together.  Plan to wean off of opioids This plan should start within one week post op of your joint replacement. Maintain the same interval or time between taking each dose and first decrease the dose.  Cut the total daily intake of opioids by one tablet each day Next start to increase the time between doses. The last dose that should be eliminated is the evening dose.           Follow-up Information     Haddix, KThomasene Lot MD. Schedule an appointment as soon as possible for a visit in 2 week(s).  Specialty: Orthopedic Surgery Why: for wound check and repeat x-rays Contact information: [place] [address] [phone]                    Discharge Instructions        [facility] Discharge Instructions   General Discharge Instructions  WEIGHT BEARING STATUS: Weightbearing as tolerated  RANGE OF MOTION/ACTIVITY: Ok for ankle range of motion as tolerated  Wound Care: You may remove your surgical dressing on Sunday 05/05/22. If incision continues to have drainage, follow wound care instructions below. Okay to shower if no drainage from incisions.  DVT/PE prophylaxis:  Aspirin  Diet: as you were eating previously.  Can use over the counter stool softeners and bowel preparations, such as Miralax, to help with bowel movements.  Narcotics can be constipating.  Be sure to drink plenty of fluids  PAIN MEDICATION USE AND EXPECTATIONS  You have likely been given narcotic medications to help control your pain.  After a traumatic event that results in an fracture (broken bone) with or without surgery, it is ok to use narcotic pain medications to help control one's pain.  We understand that everyone responds to pain differently and each individual patient will be evaluated on a regular basis for the continued need for narcotic medications. Ideally, narcotic medication use should last no more than 6-8 weeks (coinciding with fracture healing).   As a patient it is your responsibility as well to monitor narcotic medication use and report the amount and frequency you use these medications when you come to your office visit.   We would also advise that if you are using narcotic medications, you should take a dose prior to therapy to maximize you participation.  IF YOU ARE ON NARCOTIC MEDICATIONS IT IS NOT PERMISSIBLE TO OPERATE A MOTOR VEHICLE (MOTORCYCLE/CAR/TRUCK/MOPED) OR HEAVY MACHINERY DO NOT MIX NARCOTICS WITH OTHER CNS (CENTRAL NERVOUS SYSTEM) DEPRESSANTS SUCH AS ALCOHOL   STOP SMOKING OR USING NICOTINE PRODUCTS!!!!  As discussed nicotine severely impairs your body's ability to heal surgical and traumatic wounds but also impairs bone healing.  Wounds and bone heal by forming microscopic blood vessels (angiogenesis) and nicotine is a vasoconstrictor (essentially, shrinks blood vessels).  Therefore, if vasoconstriction occurs to these microscopic blood vessels they essentially disappear and are unable to deliver necessary nutrients to the healing tissue.  This is one modifiable factor that you can do to dramatically increase your chances of healing your injury.    (This means  no smoking, no nicotine gum, patches, etc)  DO NOT USE NONSTEROIDAL ANTI-INFLAMMATORY DRUGS (NSAID'S)  Using products such as Advil (ibuprofen), Aleve (naproxen), Motrin (ibuprofen) for additional pain control during fracture healing can delay and/or prevent the healing response.  If you would like to take over the counter (OTC) medication, Tylenol (acetaminophen) is ok.  However, some narcotic medications that are given for pain control contain acetaminophen as well. Therefore, you should not exceed more than 4000 mg of tylenol in a day if you do not have liver disease.  Also note that there are may OTC medicines, such as cold medicines and allergy medicines that my contain tylenol as well.  If you have any questions about medications and/or interactions please ask your doctor/PA or your pharmacist.      ICE AND ELEVATE INJURED/OPERATIVE EXTREMITY  Using ice and elevating the injured extremity above your heart can help with swelling and pain control.  Icing in a pulsatile fashion, such as 20 minutes  on and 20 minutes off, can be followed.    Do not place ice directly on skin. Make sure there is a barrier between to skin and the ice pack.    Using frozen items such as frozen peas works well as the conform nicely to the are that needs to be iced.  USE AN ACE WRAP OR TED HOSE FOR SWELLING CONTROL  In addition to icing and elevation, Ace wraps or TED hose are used to help limit and resolve swelling.  It is recommended to use Ace wraps or TED hose until you are informed to stop.    When using Ace Wraps start the wrapping distally (farthest away from the body) and wrap proximally (closer to the body)   Example: If you had surgery on your leg or thing and you do not have a splint on, start the ace wrap at the toes and work your way up to the thigh        If you had surgery on your upper extremity and do not have a splint on, start the ace wrap at your fingers and work your way up to the upper arm  [place]: [phone]   VISIT OUR WEBSITE FOR ADDITIONAL INFORMATION: [email or web address]     Discharge Wound Care Instructions  Do NOT apply any ointments, solutions or lotions to pin sites or surgical wounds.  These prevent needed drainage and even though solutions like hydrogen peroxide kill bacteria, they also damage cells lining the pin sites that help fight infection.  Applying lotions or ointments can keep the wounds moist and can cause them to breakdown and open up as well. This can increase the risk for infection. When in doubt call the office.  Surgical incisions should be dressed daily.  If any drainage is noted, use one layer of adaptic or Mepitel, then gauze, Kerlix, and an ace wrap. - These dressing supplies should be available at local medical supply stores [facility], [facility], etc) as well as Management consultant ([facility], [facility], [place], etc)  Once the incision is completely dry and without drainage, it may be left open to air out.  Showering may begin 36-48 hours later.  Cleaning gently with soap and water.      Signed: Devon Pretty A Shani Fitch 05/04/2022, 1:12 PM

## 2022-05-04 NOTE — Progress Notes (Signed)
OT Cancellation Note  Patient Details Name: LARYSA PALL MRN: [id number] DOB: September 29, 1932   Cancelled Treatment:    Reason Eval/Treat Not Completed: OT screened, no needs identified, will sign off Per pt and PT, pt functioning at a Modified Independent level for mobility and declines concerns with ADLs. No formal OT eval needed  Layla Maw 05/04/2022, 8:42 AM

## 2022-05-04 NOTE — Evaluation (Signed)
Physical Therapy Evaluation Patient Details Name: Cassandra [name] MRN: [id number] DOB: 1933/08/18 Today's Date: 05/04/2022  History of Present Illness  Cassandra [name] is an 88 y.o. female with medical history significant for type 2 diabetes, diabetic polyneuropathy, essential hypertension, hyperlipidemia, hypothyroidism, history of breast cancer, former tobacco user, COPD, OSA, coronary artery disease, peripheral vascular disease, carotid artery occlusion status post carotid stent placement, gout presenting for surgery of left ankle. Pt s/p left ankle I and D 2/2 left distal tibia osteomyelitis.  Clinical Impression  Pt agreeable to evaluation. Pt performing bed mobility, and transfers at modified independent level. No further acute care physical therapy indicated.      Recommendations for follow up therapy are one component of a multi-disciplinary discharge planning process, led by the attending physician.  Recommendations may be updated based on patient status, additional functional criteria and insurance authorization.  Follow Up Recommendations No PT follow up      Assistance Recommended at Discharge PRN  Patient can return home with the following  Direct supervision/assist for medications management;Assist for transportation;Help with stairs or ramp for entrance    Equipment Recommendations None recommended by PT  Recommendations for Other Services       Functional Status Assessment Patient has not had a recent decline in their functional status     Precautions / Restrictions Precautions Precautions: Fall Restrictions Weight Bearing Restrictions: Yes LLE Weight Bearing: Weight bearing as tolerated      Mobility  Bed Mobility Overal bed mobility: Modified Independent                  Transfers Overall transfer level: Modified independent Equipment used: Rolling walker (2 wheels)               General transfer comment: Sit <> stand from bed.     Ambulation/Gait Ambulation/Gait assistance: Supervision, Modified independent (Device/Increase time) Gait Distance (Feet): 100 Feet Assistive device: Rolling walker (2 wheels) Gait Pattern/deviations: Step-to pattern, Step-through pattern, Decreased stance time - left Gait velocity: decreased Gait velocity interpretation: <1.31 ft/sec, indicative of household ambulator   General Gait Details: Pt progressed quickly from step to pattern to step through pattern. Pt required a few standing rest breaks. Pt with no LOB. Cues required to keep her COG forwards inside RW. Supervision for safety/eval purposes only.  Stairs            Wheelchair Mobility    Modified Rankin (Stroke Patients Only)       Balance Overall balance assessment: Modified Independent                                           Pertinent Vitals/Pain Pain Assessment Pain Assessment: No/denies pain    Home Living Family/patient expects to be discharged to:: Private residence Living Arrangements: Spouse/significant other Available Help at Discharge: Family;Available 24 hours/day Type of Home: House Home Access: Ramped entrance       Home Layout: One level Home Equipment: BSC/3in1;Shower seat;Wheelchair Probation officer (4 wheels);Rolling Walker (2 wheels);Cane - single point;Grab bars - toilet;Grab bars - tub/shower      Prior Function Prior Level of Function : Independent/Modified Independent;Needs assist             Mobility Comments: Ambulates with RW. ADLs Comments: Pt does not drive. Pt's husband takes care of housework, meals, and errands. Pt indep with bathing and dressing. Pt's husband  helps gives pt her medications.     Hand Dominance        Extremity/Trunk Assessment   Upper Extremity Assessment Upper Extremity Assessment: Overall WFL for tasks assessed    Lower Extremity Assessment Lower Extremity Assessment: Overall WFL for tasks assessed        Communication   Communication: No difficulties  Cognition Arousal/Alertness: Awake/alert Behavior During Therapy: WFL for tasks assessed/performed Overall Cognitive Status: Within Functional Limits for tasks assessed                                          General Comments General comments (skin integrity, edema, etc.): HR and SpO2 stable on RA    Exercises     Assessment/Plan    PT Assessment Patient does not need any further PT services  PT Problem List         PT Treatment Interventions      PT Goals (Current goals can be found in the Care Plan section)  Acute Rehab PT Goals Patient Stated Goal: n/a    Frequency       Co-evaluation               AM-PAC PT "6 Clicks" Mobility  Outcome Measure Help needed turning from your back to your side while in a flat bed without using bedrails?: None Help needed moving from lying on your back to sitting on the side of a flat bed without using bedrails?: None Help needed moving to and from a bed to a chair (including a wheelchair)?: None Help needed standing up from a chair using your arms (e.g., wheelchair or bedside chair)?: None Help needed to walk in hospital room?: None Help needed climbing 3-5 steps with a railing? : A Little 6 Click Score: 23    End of Session Equipment Utilized During Treatment: Gait belt Activity Tolerance: Patient tolerated treatment well Patient left: in bed;with call bell/phone within reach Nurse Communication: Mobility status;Weight bearing status PT Visit Diagnosis: Other abnormalities of gait and mobility (R26.89)    Time: 9147-8295 PT Time Calculation (min) (ACUTE ONLY): 23 min   Charges:   PT Evaluation $PT Eval Low Complexity: 1 Low          Donna Bernard, PT   [facility] 05/04/2022, 8:32 AM

## 2022-05-04 NOTE — Progress Notes (Signed)
     Subjective:  Patient reports pain as mild.  Slept well overnight. No acute issues. No concerns about going home today. Denies distal n/t.  Objective:   VITALS:   Vitals:   05/03/22 1213 05/03/22 2014 05/03/22 2331 05/04/22 0455  BP: (!) 145/61 (!) 148/53 (!) 90/55 (!) 145/62  Pulse: 71 68 76 67  Resp:  _0 Temp:  97.7 F (36.5 C)  98.2 F (36.8 C)  TempSrc:  Oral    SpO2:  96% 97%   Weight:      Height:        Sensation intact distally Dorsiflexion/Plantar flexion intact Incision: dressing C/D/I   Lab Results  Component Value Date   WBC 9.5 05/04/2022   HGB 8.9 (L) 05/04/2022   HCT 27.6 (L) 05/04/2022   MCV 77.1 (L) 05/04/2022   PLT 223 05/04/2022   BMET    Component Value Date/Time   NA 138 05/04/2022 0124   NA 140 02/23/2014 1406   K 4.5 05/04/2022 0124   K 3.9 02/23/2014 1406   CL 105 05/04/2022 0124   CL 103 02/23/2014 1406   CO2 25 05/04/2022 0124   CO2 30 02/23/2014 1406   GLUCOSE 149 (H) 05/04/2022 0124   GLUCOSE 70 02/23/2014 1406   BUN 28 (H) 05/04/2022 0124   BUN 16 02/23/2014 1406   CREATININE 1.97 (H) 05/04/2022 0124   CREATININE 1.14 (H) 01/29/2022 1204   CALCIUM 8.5 (L) 05/04/2022 0124   CALCIUM 9.3 02/23/2014 1406   EGFR 47 (L) 01/29/2022 1204   GFRNONAA 24 (L) 05/04/2022 0124   GFRNONAA >60 02/23/2014 1406   Assessment/Plan: 1 Day Post-Op   Principal Problem:   Wound infection  S/p L ankle I&D with Dr. Doreatha Martin 9/1  WBAT Abx per ID Discharge home this AM    Armas Mcbee A Roran Wegner 05/04/2022, 7:04 AM   Charlies Constable, MD  Contact information:   [id number] 7am-5pm epic message Dr. Zachery Dakins, or call office for patient follow up: [phone] After hours and holidays please check [email or web address] for group call information for [facility]

## 2022-05-04 NOTE — Plan of Care (Signed)
  Problem: Education: Goal: Ability to describe self-care measures that may prevent or decrease complications (Diabetes Survival Skills Education) will improve Outcome: Not Progressing Goal: Individualized Educational Video(s) Outcome: Not Progressing   Problem: Coping: Goal: Ability to adjust to condition or change in health will improve Outcome: Not Progressing   Problem: Fluid Volume: Goal: Ability to maintain a balanced intake and output will improve Outcome: Not Progressing   Problem: Health Behavior/Discharge Planning: Goal: Ability to identify and utilize available resources and services will improve Outcome: Not Progressing Goal: Ability to manage health-related needs will improve Outcome: Not Progressing   Problem: Metabolic: Goal: Ability to maintain appropriate glucose levels will improve Outcome: Not Progressing   

## 2022-05-07 ENCOUNTER — Encounter (HOSPITAL_COMMUNITY): Payer: Self-pay | Admitting: Student

## 2022-05-07 ENCOUNTER — Telehealth: Payer: Self-pay | Admitting: Pharmacist

## 2022-05-07 NOTE — Telephone Encounter (Addendum)
Received notification from [facility] that amikacin was discontinued prior to patient being discharged from hospital last week. Discussed with ID pharmacist, Jimmy Footman, and Dr. Gale Journey and amikacin is to continue at lower dose of 800 mg three times weekly as planned. Will continue to keep a close eye on SCr and trough levels and follow up with patient during her appointment tomorrow 9/6 with Dr. Gale Journey.  Discussed with Amy, pharmacist at [facility] She will continue amikacin. Also gave verbal order to increase to twice weekly BMP and amikacin trough since SCr continues to fluctuate. Will watch closely.  Jabaree Mercado L. Eber Hong, PharmD, BCIDP, AAHIVP, CPP Clinical Pharmacist Practitioner Infectious Diseases [facility] 05/07/2022, 9:16 AM

## 2022-05-08 ENCOUNTER — Ambulatory Visit (INDEPENDENT_AMBULATORY_CARE_PROVIDER_SITE_OTHER): Payer: Medicare Other | Admitting: Internal Medicine

## 2022-05-08 ENCOUNTER — Other Ambulatory Visit: Payer: Self-pay

## 2022-05-08 ENCOUNTER — Encounter: Payer: Self-pay | Admitting: Internal Medicine

## 2022-05-08 ENCOUNTER — Telehealth: Payer: Self-pay

## 2022-05-08 VITALS — Temp 98.0°F

## 2022-05-08 DIAGNOSIS — M869 Osteomyelitis, unspecified: Secondary | ICD-10-CM | POA: Diagnosis not present

## 2022-05-08 DIAGNOSIS — A318 Other mycobacterial infections: Secondary | ICD-10-CM | POA: Diagnosis not present

## 2022-05-08 LAB — AEROBIC/ANAEROBIC CULTURE W GRAM STAIN (SURGICAL/DEEP WOUND)
Culture: NO GROWTH
Culture: NO GROWTH
Gram Stain: NONE SEEN
Gram Stain: NONE SEEN

## 2022-05-08 LAB — ACID FAST SMEAR (AFB, MYCOBACTERIA): Acid Fast Smear: NEGATIVE

## 2022-05-08 NOTE — Telephone Encounter (Signed)
Per patient's request called Cassandra [name] to have labs done by nursing team. Spoke with Caryl Pina who was able to take verbal order for labs. Will need CBC, CMP, CRP, and Amikacin level. Requested this be done tomorrow by home health and results be faxed to office. Verbalized understanding. Leatrice Jewels, RMA

## 2022-05-08 NOTE — Patient Instructions (Signed)
Your current medication are 4 antibiotics IV's: Aminoglycoside 800 three times a week Cefoxitin 6 gram the ball the whole day  By mouth: Linezolid 600 mg once a day Clofazimine 100 mg once a day    We plan to repeat ct of your leg in 2 months Will have to play day by day to see. I hope the surgery had done probably much more than the medications
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
## 2022-05-08 NOTE — Progress Notes (Signed)
Met with patient with Dr. Gale Journey. Explained the antibiotics that she should be on now:  - Amikacin 800 mg three times weekly - Cefoxitin 2gm IV q12h - Linezolid 600 mg PO once daily - Clofazimine 100 mg PO once daily  Called [facility] to verify that she picked up linezolid. She did. Called [place] and let them know that they should have it at home and it is NOT located in her blister pack. They will call me if they cannot find it. Will keep a close eye on her renal function and amikacin levels.  Hemoglobin was 6.8 on labs from 9/4. Dr. Gale Journey repeating today.  Ollie Delano L. Lamya Lausch, PharmD, BCIDP, AAHIVP, CPP Clinical Pharmacist Practitioner Infectious Diseases [place] [facility] 05/08/2022, 4:28 PM

## 2022-05-09 NOTE — Anesthesia Postprocedure Evaluation (Signed)
Anesthesia Post Note  Patient: Cassandra [name]  Procedure(s) Performed: DEBRIDEMENT OF LEFT ANKLE INFECTION (Left: Ankle)     Patient location during evaluation: PACU Anesthesia Type: General Level of consciousness: awake and patient cooperative Pain management: pain level controlled Vital Signs Assessment: post-procedure vital signs reviewed and stable Respiratory status: spontaneous breathing, nonlabored ventilation, respiratory function stable and patient connected to nasal cannula oxygen Cardiovascular status: blood pressure returned to baseline and stable Postop Assessment: no apparent nausea or vomiting Anesthetic complications: no   No notable events documented.  Last Vitals:  Vitals:   05/04/22 0455 05/04/22 1127  BP: (!) 145/62 (!) 133/49  Pulse: 67 62  Resp: 17 18  Temp: 36.8 C 36.6 C  SpO2:  93%    Last Pain:  Vitals:   05/04/22 1113  TempSrc:   PainSc: 0-No pain                 Mehlani Blankenburg

## 2022-05-21 LAB — MISC LABCORP TEST (SEND OUT): Labcorp test code: 8474

## 2022-05-25 LAB — AEROBIC/ANAEROBIC CULTURE W GRAM STAIN (SURGICAL/DEEP WOUND)

## 2022-06-02 ENCOUNTER — Inpatient Hospital Stay: Payer: Medicare Other

## 2022-06-02 ENCOUNTER — Other Ambulatory Visit: Payer: Self-pay

## 2022-06-02 ENCOUNTER — Encounter: Payer: Self-pay | Admitting: Emergency Medicine

## 2022-06-02 ENCOUNTER — Emergency Department: Payer: Medicare Other

## 2022-06-02 ENCOUNTER — Inpatient Hospital Stay
Admission: EM | Admit: 2022-06-02 | Discharge: 2022-06-04 | DRG: 092 | Disposition: A | Discharge status: Home Health | Payer: Medicare Other | Attending: Internal Medicine | Admitting: Internal Medicine

## 2022-06-02 DIAGNOSIS — Z87891 Personal history of nicotine dependence: Secondary | ICD-10-CM

## 2022-06-02 DIAGNOSIS — Z853 Personal history of malignant neoplasm of breast: Secondary | ICD-10-CM | POA: Diagnosis not present

## 2022-06-02 DIAGNOSIS — R443 Hallucinations, unspecified: Secondary | ICD-10-CM | POA: Diagnosis present

## 2022-06-02 DIAGNOSIS — E1169 Type 2 diabetes mellitus with other specified complication: Secondary | ICD-10-CM | POA: Diagnosis present

## 2022-06-02 DIAGNOSIS — Z9889 Other specified postprocedural states: Secondary | ICD-10-CM

## 2022-06-02 DIAGNOSIS — G4733 Obstructive sleep apnea (adult) (pediatric): Secondary | ICD-10-CM | POA: Diagnosis present

## 2022-06-02 DIAGNOSIS — Z950 Presence of cardiac pacemaker: Secondary | ICD-10-CM | POA: Diagnosis present

## 2022-06-02 DIAGNOSIS — E1151 Type 2 diabetes mellitus with diabetic peripheral angiopathy without gangrene: Secondary | ICD-10-CM | POA: Diagnosis present

## 2022-06-02 DIAGNOSIS — I160 Hypertensive urgency: Secondary | ICD-10-CM | POA: Diagnosis present

## 2022-06-02 DIAGNOSIS — G929 Unspecified toxic encephalopathy: Secondary | ICD-10-CM | POA: Diagnosis present

## 2022-06-02 DIAGNOSIS — I252 Old myocardial infarction: Secondary | ICD-10-CM

## 2022-06-02 DIAGNOSIS — Z85828 Personal history of other malignant neoplasm of skin: Secondary | ICD-10-CM

## 2022-06-02 DIAGNOSIS — G928 Other toxic encephalopathy: Secondary | ICD-10-CM | POA: Diagnosis present

## 2022-06-02 DIAGNOSIS — G2581 Restless legs syndrome: Secondary | ICD-10-CM | POA: Diagnosis present

## 2022-06-02 DIAGNOSIS — E538 Deficiency of other specified B group vitamins: Secondary | ICD-10-CM | POA: Diagnosis present

## 2022-06-02 DIAGNOSIS — T428X5A Adverse effect of antiparkinsonism drugs and other central muscle-tone depressants, initial encounter: Secondary | ICD-10-CM | POA: Diagnosis present

## 2022-06-02 DIAGNOSIS — J9611 Chronic respiratory failure with hypoxia: Secondary | ICD-10-CM | POA: Diagnosis present

## 2022-06-02 DIAGNOSIS — I739 Peripheral vascular disease, unspecified: Secondary | ICD-10-CM | POA: Diagnosis present

## 2022-06-02 DIAGNOSIS — R4182 Altered mental status, unspecified: Principal | ICD-10-CM

## 2022-06-02 DIAGNOSIS — I129 Hypertensive chronic kidney disease with stage 1 through stage 4 chronic kidney disease, or unspecified chronic kidney disease: Secondary | ICD-10-CM | POA: Diagnosis present

## 2022-06-02 DIAGNOSIS — I2510 Atherosclerotic heart disease of native coronary artery without angina pectoris: Secondary | ICD-10-CM | POA: Diagnosis present

## 2022-06-02 DIAGNOSIS — R911 Solitary pulmonary nodule: Secondary | ICD-10-CM

## 2022-06-02 DIAGNOSIS — Z79890 Hormone replacement therapy (postmenopausal): Secondary | ICD-10-CM

## 2022-06-02 DIAGNOSIS — E1122 Type 2 diabetes mellitus with diabetic chronic kidney disease: Secondary | ICD-10-CM | POA: Diagnosis present

## 2022-06-02 DIAGNOSIS — Z7984 Long term (current) use of oral hypoglycemic drugs: Secondary | ICD-10-CM

## 2022-06-02 DIAGNOSIS — R29818 Other symptoms and signs involving the nervous system: Secondary | ICD-10-CM

## 2022-06-02 DIAGNOSIS — R0902 Hypoxemia: Secondary | ICD-10-CM | POA: Diagnosis not present

## 2022-06-02 DIAGNOSIS — M86672 Other chronic osteomyelitis, left ankle and foot: Secondary | ICD-10-CM

## 2022-06-02 DIAGNOSIS — J9601 Acute respiratory failure with hypoxia: Secondary | ICD-10-CM

## 2022-06-02 DIAGNOSIS — Z8673 Personal history of transient ischemic attack (TIA), and cerebral infarction without residual deficits: Secondary | ICD-10-CM

## 2022-06-02 DIAGNOSIS — I452 Bifascicular block: Secondary | ICD-10-CM | POA: Diagnosis present

## 2022-06-02 DIAGNOSIS — E7800 Pure hypercholesterolemia, unspecified: Secondary | ICD-10-CM | POA: Diagnosis present

## 2022-06-02 DIAGNOSIS — M869 Osteomyelitis, unspecified: Secondary | ICD-10-CM | POA: Diagnosis present

## 2022-06-02 DIAGNOSIS — Z20822 Contact with and (suspected) exposure to covid-19: Secondary | ICD-10-CM | POA: Diagnosis present

## 2022-06-02 DIAGNOSIS — J449 Chronic obstructive pulmonary disease, unspecified: Secondary | ICD-10-CM | POA: Diagnosis present

## 2022-06-02 DIAGNOSIS — E119 Type 2 diabetes mellitus without complications: Secondary | ICD-10-CM | POA: Diagnosis present

## 2022-06-02 DIAGNOSIS — N184 Chronic kidney disease, stage 4 (severe): Secondary | ICD-10-CM | POA: Diagnosis present

## 2022-06-02 DIAGNOSIS — E118 Type 2 diabetes mellitus with unspecified complications: Secondary | ICD-10-CM | POA: Diagnosis present

## 2022-06-02 DIAGNOSIS — E039 Hypothyroidism, unspecified: Secondary | ICD-10-CM | POA: Diagnosis present

## 2022-06-02 DIAGNOSIS — Z794 Long term (current) use of insulin: Secondary | ICD-10-CM

## 2022-06-02 DIAGNOSIS — Z955 Presence of coronary angioplasty implant and graft: Secondary | ICD-10-CM

## 2022-06-02 LAB — CBC WITH DIFFERENTIAL/PLATELET
Abs Immature Granulocytes: 0.04 10*3/uL (ref 0.00–0.07)
Basophils Absolute: 0.1 10*3/uL (ref 0.0–0.1)
Basophils Relative: 1 %
Eosinophils Absolute: 0.5 10*3/uL (ref 0.0–0.5)
Eosinophils Relative: 5 %
HCT: 33.4 % — ABNORMAL LOW (ref 36.0–46.0)
Hemoglobin: 10.2 g/dL — ABNORMAL LOW (ref 12.0–15.0)
Immature Granulocytes: 0 %
Lymphocytes Relative: 21 %
Lymphs Abs: 2.3 10*3/uL (ref 0.7–4.0)
MCH: 24.9 pg — ABNORMAL LOW (ref 26.0–34.0)
MCHC: 30.5 g/dL (ref 30.0–36.0)
MCV: 81.7 fL (ref 80.0–100.0)
Monocytes Absolute: 1.1 10*3/uL — ABNORMAL HIGH (ref 0.1–1.0)
Monocytes Relative: 10 %
Neutro Abs: 6.9 10*3/uL (ref 1.7–7.7)
Neutrophils Relative %: 63 %
Platelets: 301 10*3/uL (ref 150–400)
RBC: 4.09 MIL/uL (ref 3.87–5.11)
RDW: 21.5 % — ABNORMAL HIGH (ref 11.5–15.5)
Smear Review: NORMAL
WBC: 11 10*3/uL — ABNORMAL HIGH (ref 4.0–10.5)
nRBC: 0 % (ref 0.0–0.2)

## 2022-06-02 LAB — LACTIC ACID, PLASMA: Lactic Acid, Venous: 1.3 mmol/L (ref 0.5–1.9)

## 2022-06-02 LAB — COMPREHENSIVE METABOLIC PANEL
ALT: 13 U/L (ref 0–44)
AST: 29 U/L (ref 15–41)
Albumin: 2.9 g/dL — ABNORMAL LOW (ref 3.5–5.0)
Alkaline Phosphatase: 86 U/L (ref 38–126)
Anion gap: 9 (ref 5–15)
BUN: 22 mg/dL (ref 8–23)
CO2: 22 mmol/L (ref 22–32)
Calcium: 9.1 mg/dL (ref 8.9–10.3)
Chloride: 110 mmol/L (ref 98–111)
Creatinine, Ser: 1.86 mg/dL — ABNORMAL HIGH (ref 0.44–1.00)
GFR, Estimated: 26 mL/min — ABNORMAL LOW (ref >60)
Glucose, Bld: 117 mg/dL — ABNORMAL HIGH (ref 70–99)
Potassium: 4.8 mmol/L (ref 3.5–5.1)
Sodium: 141 mmol/L (ref 135–145)
Total Bilirubin: 1.1 mg/dL (ref 0.3–1.2)
Total Protein: 7.6 g/dL (ref 6.5–8.1)

## 2022-06-02 LAB — URINALYSIS, ROUTINE W REFLEX MICROSCOPIC
Bacteria, UA: NONE SEEN
Bilirubin Urine: NEGATIVE
Glucose, UA: NEGATIVE mg/dL
Hgb urine dipstick: NEGATIVE
Ketones, ur: NEGATIVE mg/dL
Leukocytes,Ua: NEGATIVE
Nitrite: NEGATIVE
Protein, ur: 30 mg/dL — AB
Specific Gravity, Urine: 1.011 (ref 1.005–1.030)
Squamous Epithelial / HPF: NONE SEEN (ref 0–5)
pH: 6 (ref 5.0–8.0)

## 2022-06-02 LAB — BLOOD GAS, ARTERIAL
Acid-Base Excess: 0.7 mmol/L (ref 0.0–2.0)
Bicarbonate: 24 mmol/L (ref 20.0–28.0)
O2 Content: 2 L/min
O2 Saturation: 98.1 %
Patient temperature: 37
pCO2 arterial: 33 mmHg (ref 32–48)
pH, Arterial: 7.47 — ABNORMAL HIGH (ref 7.35–7.45)
pO2, Arterial: 89 mmHg (ref 83–108)

## 2022-06-02 LAB — RESP PANEL BY RT-PCR (FLU A&B, COVID) ARPGX2
Influenza A by PCR: NEGATIVE
Influenza B by PCR: NEGATIVE
SARS Coronavirus 2 by RT PCR: NEGATIVE

## 2022-06-02 LAB — CBG MONITORING, ED: Glucose-Capillary: 229 mg/dL — ABNORMAL HIGH (ref 70–99)

## 2022-06-02 MED ORDER — FUROSEMIDE 20 MG PO TABS
20.0000 mg | ORAL_TABLET | Freq: Every day | ORAL | Status: DC
  Administered 2022-06-02 – 2022-06-04 (×3): 20 mg via ORAL
  Filled 2022-06-02 (×3): qty 1

## 2022-06-02 MED ORDER — ACETAMINOPHEN 325 MG PO TABS: 650.0000 mg | ORAL_TABLET | ORAL | Status: DC | PRN

## 2022-06-02 MED ORDER — HYDRALAZINE HCL 25 MG PO TABS
25.0000 mg | ORAL_TABLET | Freq: Three times a day (TID) | ORAL | Status: DC
  Administered 2022-06-02 – 2022-06-04 (×4): 25 mg via ORAL
  Filled 2022-06-02 (×5): qty 1

## 2022-06-02 MED ORDER — TRAZODONE HCL 50 MG PO TABS
50.0000 mg | ORAL_TABLET | Freq: Every evening | ORAL | Status: DC | PRN
  Administered 2022-06-02 – 2022-06-03 (×2): 50 mg via ORAL
  Filled 2022-06-02 (×2): qty 1

## 2022-06-02 MED ORDER — LINEZOLID 600 MG PO TABS
600.0000 mg | ORAL_TABLET | ORAL | Status: DC
  Administered 2022-06-02 – 2022-06-03 (×2): 600 mg via ORAL
  Filled 2022-06-02 (×3): qty 1

## 2022-06-02 MED ORDER — ALBUTEROL SULFATE (2.5 MG/3ML) 0.083% IN NEBU: 3.0000 mL | INHALATION_SOLUTION | Freq: Four times a day (QID) | RESPIRATORY_TRACT | Status: DC | PRN

## 2022-06-02 MED ORDER — DEXTROSE 5 % IV SOLN
800.0000 mg | INTRAVENOUS | Status: DC
  Administered 2022-06-04: 800 mg via INTRAVENOUS
  Filled 2022-06-02: qty 3.2

## 2022-06-02 MED ORDER — ACETAMINOPHEN 160 MG/5ML PO SOLN: 650.0000 mg | ORAL | Status: DC | PRN

## 2022-06-02 MED ORDER — DOXYCYCLINE HYCLATE 100 MG PO TABS
100.0000 mg | ORAL_TABLET | Freq: Two times a day (BID) | ORAL | Status: DC
  Administered 2022-06-02: 100 mg via ORAL
  Filled 2022-06-02: qty 1

## 2022-06-02 MED ORDER — AMLODIPINE BESYLATE 5 MG PO TABS
5.0000 mg | ORAL_TABLET | Freq: Every day | ORAL | Status: DC
  Administered 2022-06-02 – 2022-06-04 (×3): 5 mg via ORAL
  Filled 2022-06-02 (×3): qty 1

## 2022-06-02 MED ORDER — HYDROCODONE-ACETAMINOPHEN 5-325 MG PO TABS: 1.0000 | ORAL_TABLET | ORAL | Status: DC | PRN

## 2022-06-02 MED ORDER — DESVENLAFAXINE SUCCINATE ER 50 MG PO TB24: 50.0000 mg | ORAL_TABLET | Freq: Every day | ORAL | Status: DC

## 2022-06-02 MED ORDER — INSULIN ASPART 100 UNIT/ML IJ SOLN
0.0000 [IU] | Freq: Three times a day (TID) | INTRAMUSCULAR | Status: DC
  Administered 2022-06-03: 3 [IU] via SUBCUTANEOUS
  Administered 2022-06-03: 5 [IU] via SUBCUTANEOUS
  Administered 2022-06-03: 2 [IU] via SUBCUTANEOUS
  Administered 2022-06-04: 3 [IU] via SUBCUTANEOUS
  Administered 2022-06-04: 2 [IU] via SUBCUTANEOUS
  Filled 2022-06-02 (×5): qty 1

## 2022-06-02 MED ORDER — INSULIN ASPART 100 UNIT/ML IJ SOLN
0.0000 [IU] | Freq: Every day | INTRAMUSCULAR | Status: DC
  Administered 2022-06-02: 2 [IU] via SUBCUTANEOUS
  Filled 2022-06-02: qty 1

## 2022-06-02 MED ORDER — SODIUM CHLORIDE 0.9 % IV SOLN
2.0000 g | Freq: Two times a day (BID) | INTRAVENOUS | Status: DC
  Administered 2022-06-02 – 2022-06-04 (×4): 2 g via INTRAVENOUS
  Filled 2022-06-02 (×6): qty 2

## 2022-06-02 MED ORDER — NITROGLYCERIN 0.4 MG SL SUBL
0.4000 mg | SUBLINGUAL_TABLET | SUBLINGUAL | Status: DC | PRN
Start: 2022-06-02 — End: 2022-06-04

## 2022-06-02 MED ORDER — IRBESARTAN 150 MG PO TABS: 150.0000 mg | ORAL_TABLET | Freq: Every day | ORAL | Status: DC

## 2022-06-02 MED ORDER — LABETALOL HCL 5 MG/ML IV SOLN
20.0000 mg | Freq: Once | INTRAVENOUS | Status: AC
  Administered 2022-06-02: 20 mg via INTRAVENOUS
  Filled 2022-06-02: qty 4

## 2022-06-02 MED ORDER — STROKE: EARLY STAGES OF RECOVERY BOOK: Freq: Once | Status: DC

## 2022-06-02 MED ORDER — ACETAMINOPHEN 650 MG RE SUPP: 650.0000 mg | RECTAL | Status: DC | PRN

## 2022-06-02 MED ORDER — LACTATED RINGERS IV BOLUS
1000.0000 mL | Freq: Once | INTRAVENOUS | Status: AC
  Administered 2022-06-02: 1000 mL via INTRAVENOUS

## 2022-06-02 MED ORDER — DESVENLAFAXINE SUCCINATE ER 25 MG PO TB24
25.0000 mg | ORAL_TABLET | Freq: Every day | ORAL | Status: DC
  Filled 2022-06-02: qty 1

## 2022-06-02 MED ORDER — INSULIN GLARGINE-YFGN 100 UNIT/ML ~~LOC~~ SOLN
10.0000 [IU] | Freq: Every day | SUBCUTANEOUS | Status: DC
  Administered 2022-06-03 – 2022-06-04 (×2): 10 [IU] via SUBCUTANEOUS
  Filled 2022-06-02 (×2): qty 0.1

## 2022-06-02 MED ORDER — ROSUVASTATIN CALCIUM 5 MG PO TABS
5.0000 mg | ORAL_TABLET | Freq: Every day | ORAL | Status: DC
  Administered 2022-06-02 – 2022-06-03 (×2): 5 mg via ORAL
  Filled 2022-06-02 (×2): qty 1
  Filled 2022-06-02: qty 0.5

## 2022-06-02 MED ORDER — ENOXAPARIN SODIUM 30 MG/0.3ML IJ SOSY
30.0000 mg | PREFILLED_SYRINGE | INTRAMUSCULAR | Status: DC
  Administered 2022-06-02 – 2022-06-03 (×2): 30 mg via SUBCUTANEOUS
  Filled 2022-06-02 (×2): qty 0.3

## 2022-06-02 NOTE — Assessment & Plan Note (Signed)
Continue basal insulin.  Sliding scale insulin coverage 

## 2022-06-02 NOTE — Consult Note (Signed)
Pharmacy Antibiotic Note  Cassandra [name] is a 88 y.o. female admitted on 06/02/2022 with  chronic osteomyelitis of the left ankle .  Pharmacy has been consulted for monitoring and continuation of outpatient antibiotic medications.  Patient follows Dr. Gale Journey and Dr Magda Kiel with [facility] for Infectious Disease for Osteomyelitis. I spoke with spouse [name], who reports patient did not take any of her medications today and he will be bringing her clofazimine tomorrow (10/2) for inpatient use, as this is not on [place].   Plan (per most recent visit on 9/6): - Amikacin IV 800 mg three times weekly (Tue, Thur, Sat) - last dose was Sunday due to held dose on Saturday - Cefoxitin 2gm IV q12h  - Linezolid 600 mg PO in the evening - Clofazimine 100 mg PO in the morning   Height: '4\' 11"'$  (149.9 cm) Weight: 61.2 kg (134 lb 14.7 oz) IBW/kg (Calculated) : 43.2  Temp (24hrs), Avg:98.1 F (36.7 C), Min:98.1 F (36.7 C), Max:98.2 F (36.8 C)  Recent Labs  Lab 06/02/22 1442  WBC 11.0*  CREATININE 1.86*  LATICACIDVEN 1.3    Estimated Creatinine Clearance: 17 mL/min (A) (by C-G formula based on SCr of 1.86 mg/dL (H)).    Allergies  Allergen Reactions   Levofloxacin Other (See Comments)    Hallucinations     Antimicrobials this admission: 10/1 Doxycyline '100mg'$  PO x1 ED 10/1 Cefoxitin >>  10/1 Amikacin >> 10/1 Linezolid >> 10/2 Clofazamine >> husband to bring in on 10/2  Microbiology results: 10/1 Resp Panel: Negative  Thank you for allowing pharmacy to be a part of this patient's care.  Darrick Penna 06/02/2022 9:50 PM

## 2022-06-02 NOTE — Assessment & Plan Note (Signed)
Continue levothyroxine 

## 2022-06-02 NOTE — ED Notes (Signed)
Dr. Damita Dunnings notified of systolic blood pressure. States will order medication if safe after ct head results.

## 2022-06-02 NOTE — ED Triage Notes (Signed)
Pt bib EMS from home with reports from family that pt has been hallucinating and altered mental x 1 day. Per EMS husband perforted that pt seeing bugs on the ceiling at home. Pt is alert&O to self, day of the week and president pt states she is at [facility]. Per EMS pt had a glazed over look while ambulance. Pt is alert during triage. O2 sats were 88 while being assessed and placed on 2l/[place]. Pt is currently receiving antibiotic treatment for bone infection. Pt states that is why she is at the hospital.

## 2022-06-02 NOTE — Assessment & Plan Note (Addendum)
Hallucinations History of prior visual hallucinations 2021 with negative stroke work-up by neurology History of carotid endarterectomy.  Lacunar stroke on CT head 2021 Patient described as having staring episodes in addition to visual hallucinations Possible etiologies include TIA, seizure, medication related (patient started on baclofen on 9/29 by PCP) We will get CT head, as well as echo, EKG and get continuous cardiac monitoring Neurologic checks Hold baclofen Continue rosuvastatin we will add aspirin We will decide on stroke work-up based on results of head CT

## 2022-06-02 NOTE — Assessment & Plan Note (Signed)
Not acutely exacerbated.  Continue albuterol as needed 

## 2022-06-02 NOTE — ED Notes (Signed)
Pt placed on oxygen at 2lpm for pox of 88%. Md notified.

## 2022-06-02 NOTE — ED Notes (Signed)
Awaiting night medications from pharmacy

## 2022-06-02 NOTE — ED Notes (Signed)
Husband at bedside, pt eating and drinking po fluids. Siderails up x2. Husband encouraged to stay and speak with hospitalist.

## 2022-06-02 NOTE — Assessment & Plan Note (Signed)
History of PCI in 2017 No complaints of chest pain, EKG nonacute Continue rosuvastatin and nitroglycerin as needed chest pain

## 2022-06-02 NOTE — ED Notes (Signed)
Md in to reassess pt.

## 2022-06-02 NOTE — ED Notes (Signed)
Pt to ct scan.

## 2022-06-02 NOTE — Assessment & Plan Note (Signed)
No acute issues suspected 

## 2022-06-02 NOTE — Assessment & Plan Note (Signed)
Continue telmisartan and Lasix

## 2022-06-02 NOTE — H&P (Signed)
History and Physical    Patient: Cassandra [name] QZE:[id number] DOB: Feb 18, 1933 DOA: 06/02/2022 DOS: the patient was seen and examined on 06/02/2022 PCP: Cassandra Crouch, MD  Patient coming from: Home  Chief Complaint:  Chief Complaint  Patient presents with   Altered Mental Status    HPI: Cassandra [name] is a 88 y.o. female with medical history significant for CAD with PCI to LAD 2017, PPM for symptomatic bradycardia, , CKD, IDDM, hypothyroidism, HTN, COPD, chronically infected hardware left ankle from an open fracture back in February 2023, currently on a 93-monthantibiotic course, followed by ID, and with prior history of visual hallucinations in February 2021 who was brought to the ER with a 1 day history of visual hallucinations and altered mental status.  She otherwise denies visual disturbance, headache, one-sided weakness numbness or tingling and denies chest pain, shortness of breath or palpitations. ED course and dataReview: BP on arrival 164/85 going as high as 209/67, with otherwise normal vitals.  Labs significant for WBC of 11, hemoglobin 10.2 which is her baseline, creatinine of 1.86 which is at baseline.  COVID and flu negative and urinalysis unremarkable.  EKG, personally viewed and interpreted with sinus rhythm at 90 with RBBB and LAFB CT chest showing the following findings: IMPRESSION: New 10 mm left solid pulmonary nodule within the upper lobe. Per Fleischner Society Guidelines, consider a non-contrast Chest CT at 3 months, a PET/CT, or tissue sampling.   These guidelines do not apply to immunocompromised patients and patients with cancer. Follow up in patients with significant comorbidities as clinically warranted. For lung cancer screening, adhere to Lung-RADS guidelines. Reference: Radiology. 2017; 284(1):228-43.   T5 compression deformity of uncertain chronicity. Correlate to point tenderness. It is new from a prior exam of June of 2022.   Changes  consistent with prior right breast implant rupture with extracapsular silicone anteriorly. This is stable from the prior exam as well as a prior mammogram.    Patient given IV labetalol and an LR 1 L bolus and hospitalist consulted for admission.     Past Medical History:  Diagnosis Date   Acquired hypothyroidism 01/06/2014   Anxiety    Arthritis    B12 deficiency 02/15/2014   Benign essential hypertension 01/06/2014   [place]   Right breast cancer - chemotherapy   CAD (coronary artery disease), native coronary artery 01/06/2014   Carotid artery calcification    Chronic airway obstruction, not elsewhere classified 01/06/2014   Chronic mesenteric ischemia (HCC)    s/p SMA stent 11/03/19   Depression    Diabetes mellitus without complication ([place]    Dysrhythmia    aflutter with RVR 06/2016   History of kidney stones 2019   left ureteral stone   Incontinence of urine    [place]ocardial infarction [place]   small infarct   Neuromuscular disorder (HCC)    restless legs   Pacemaker    Presence of permanent cardiac pacemaker 07/2016   Pure hypercholesterolemia 01/06/2014   Renal insufficiency    Skin cancer    Sleep apnea    no longer uses a cpap. Uses oxygen as needed   Past Surgical History:  Procedure Laterality Date   ANKLE FUSION Left 10/10/2021   Procedure: ANKLE FUSION;  Surgeon: HShona Needles MD;  [facility]  Service: Orthopedics;  Laterality: Left;   APPENDECTOMY     APPLICATION OF WOUND VAC Left 02/24/2021   Procedure: APPLICATION OF WOUND VAC;  Surgeon: SLyla Glassing  Cassandra Edelman, MD;  Location: [place];  Service: Orthopedics;  Laterality: Left;   AUGMENTATION MAMMAPLASTY Bilateral 1982/redo in 2014   prior mastectomy   CARDIAC CATHETERIZATION N/A 04/23/2016   Procedure: Left Heart Cath and Coronary Angiography;  Surgeon: Corey Skains, MD;  Location: [facility];  Service: Cardiovascular;  Laterality: N/A;   CARDIAC CATHETERIZATION Left  04/23/2016   Procedure: Coronary Stent Intervention;  Surgeon: Yolonda Kida, MD;  Location: [place] [facility];  Service: Cardiovascular;  Laterality: Left;   CAROTID ARTERY ANGIOPLASTY Left 2010   had stent inserted and removed d/t infection. artery from leg inserted in left carotid   CAROTID ENDARTERECTOMY Left 2010   left CEA ~ 2010, s/p excision infected left carotid patch & pseudoaneurysm with left CCA-ICA bypass using SVG 05/20/12   CHOLECYSTECTOMY     CORONARY ANGIOPLASTY WITH STENT PLACEMENT  september 9th 2017   CYSTOSCOPY W/ URETERAL STENT PLACEMENT Left 08/17/2017   Procedure: CYSTOSCOPY WITH RETROGRADE PYELOGRAM/URETERAL STENT PLACEMENT;  Surgeon: Festus Aloe, MD;  Location: [facility];  Service: Urology;  Laterality: Left;   CYSTOSCOPY W/ URETERAL STENT PLACEMENT Left 09/16/2017   Procedure: CYSTOSCOPY WITH STENT REPLACEMENT;  Surgeon: Abbie Sons, MD;  Location: [facility];  Service: Urology;  Laterality: Left;   CYSTOSCOPY/RETROGRADE/URETEROSCOPY/STONE EXTRACTION WITH BASKET Left 09/16/2017   Procedure: CYSTOSCOPY/RETROGRADE/URETEROSCOPY/STONE EXTRACTION WITH BASKET;  Surgeon: Abbie Sons, MD;  Location: [facility];  Service: Urology;  Laterality: Left;   EXTERNAL FIXATION LEG Left 02/24/2021   Procedure: EXTERNAL FIXATION  ANKLE;  Surgeon: Rod Can, MD;  Location: [place];  Service: Orthopedics;  Laterality: Left;   EXTERNAL FIXATION REMOVAL Left 02/26/2021   Procedure: REMOVAL EXTERNAL FIXATION LEG;  Surgeon: Shona Needles, MD;  Location: [place];  Service: Orthopedics;  Laterality: Left;   EYE SURGERY Bilateral    cataract extraction   I & D EXTREMITY Left 02/24/2021   Procedure: IRRIGATION AND DEBRIDEMENT EXTREMITY;  Surgeon: Rod Can, MD;  Location: [place];  Service: Orthopedics;  Laterality: Left;   I & D EXTREMITY Left 12/21/2021   Procedure: IRRIGATION AND DEBRIDEMENT LEFT ANKLE;  Surgeon: Shona Needles, MD;  Location: [place];  Service:  Orthopedics;  Laterality: Left;   I & D EXTREMITY Left 05/03/2022   Procedure: DEBRIDEMENT OF LEFT ANKLE INFECTION;  Surgeon: Shona Needles, MD;  Location: [place];  Service: Orthopedics;  Laterality: Left;   IR FLUORO GUIDE CV LINE LEFT  12/27/2021   IR FLUORO GUIDE CV LINE RIGHT  10/12/2021   IR REMOVAL TUN CV CATH W/O FL  11/22/2021   IR US GUIDE VASC ACCESS LEFT  12/27/2021   IR US GUIDE VASC ACCESS RIGHT  10/12/2021   KYPHOPLASTY N/A 02/03/2018   Procedure: [id number];  Surgeon: Hessie Knows, MD;  Location: [facility];  Service: Orthopedics;  Laterality: N/A;   MASTECTOMY Right 1982   ORIF ANKLE FRACTURE Left 02/26/2021   Procedure: OPEN REDUCTION INTERNAL FIXATION (ORIF) ANKLE FRACTURE;  Surgeon: Shona Needles, MD;  Location: [place];  Service: Orthopedics;  Laterality: Left;   PACEMAKER INSERTION Left 07/03/2016   Procedure: INSERTION PACEMAKER;  Surgeon: Isaias Cowman, MD;  Location: [facility];  Service: Cardiovascular;  Laterality: Left;   SKIN CANCER EXCISION     TONSILLECTOMY     TUBAL LIGATION     VISCERAL ANGIOGRAPHY N/A 11/03/2019   Procedure: VISCERAL ANGIOGRAPHY;  Surgeon: Katha Cabal, MD;  Location: [place] [facility];  Service: Cardiovascular;  Laterality: N/A;  Social History:  reports that she quit smoking about 67 years ago. Her smoking use included cigarettes. She started smoking about 68 years ago. She smoked an average of .5 packs per day. She has never used smokeless tobacco. She reports that she does not currently use alcohol. She reports that she does not currently use drugs.  Allergies  Allergen Reactions   Levofloxacin Other (See Comments)    Hallucinations     Family History  Problem Relation Age of Onset   Prostate cancer Neg Hx    Kidney cancer Neg Hx    Breast cancer Neg Hx     Prior to Admission medications   Medication Sig Start Date End Date Taking? Authorizing Provider  acetaminophen (TYLENOL) 500 MG tablet Take 500-1,000 mg by  mouth every 6 (six) hours as needed for moderate pain.    [provider]  albuterol (VENTOLIN HFA) 108 (90 Base) MCG/ACT inhaler Inhale 2 puffs into the lungs every 6 (six) hours as needed for wheezing or shortness of breath. 03/22/21   Angiulli, Lavon Paganini, PA-C  amikacin (AMIKIN) 1 GM/4ML SOLN injection  05/30/22   [provider]  amitriptyline (ELAVIL) 25 MG tablet Take 25 mg by mouth at bedtime.    [provider]  baclofen (LIORESAL) 10 MG tablet Take 10 mg by mouth 2 (two) times daily. 05/31/22   [provider]  Cholecalciferol (VITAMIN D3 PO) Take 1 capsule by mouth daily.    [provider]  CLOFAZIMINE PO Take 100 mg by mouth daily.    [provider]  desvenlafaxine (PRISTIQ) 50 MG 24 hr tablet Take 50 mg by mouth daily. 01/22/22   [provider]  furosemide (LASIX) 20 MG tablet Take 20 mg by mouth daily. 01/22/22   [provider]  HYDROcodone-acetaminophen (NORCO/VICODIN) 5-325 MG tablet Take 1-2 tablets by mouth every 4 (four) hours as needed for severe pain. 05/03/22   Corinne Ports, PA-C  insulin aspart (NOVOLOG FLEXPEN) 100 UNIT/ML FlexPen Inject 4 Units into the skin daily.    [provider]  insulin glargine (LANTUS SOLOSTAR) 100 UNIT/ML Solostar Pen Inject 12 Units into the skin at bedtime. Patient taking differently: Inject 10 Units into the skin at bedtime. 03/22/21   Angiulli, Lavon Paganini, PA-C  levothyroxine (SYNTHROID) 100 MCG tablet Take 100 mcg by mouth every morning. 01/22/22   [provider]  linezolid (ZYVOX) 600 MG tablet Take 1 tablet (600 mg total) by mouth daily. 04/24/22   Kuppelweiser, Cassie L, RPH-CPP  metoCLOPramide (REGLAN) 5 MG tablet Take 5 mg by mouth 2 (two) times daily. 04/23/22   [provider]  Multiple Vitamin (MULTIVITAMIN WITH MINERALS) TABS tablet Take 1 tablet by mouth daily. 09/24/17   Max Sane, MD  nitroGLYCERIN (NITROSTAT) 0.4 MG SL tablet Place 1  tablet (0.4 mg total) under the tongue every 5 (five) minutes as needed for chest pain. 04/24/16   Bettey Costa, MD  nystatin (MYCOSTATIN) 100000 UNIT/ML suspension Take 2-5 mLs by mouth daily as needed (thrush). 04/02/22   [provider]  rosuvastatin (CRESTOR) 10 MG tablet Take 10 mg by mouth at bedtime. 05/21/22   [provider]  telmisartan (MICARDIS) 40 MG tablet Take 40 mg by mouth daily. 05/27/22   [provider]  traZODone (DESYREL) 50 MG tablet Take 50 mg by mouth at bedtime.    [provider]    Physical Exam: Vitals:   06/02/22 1630 06/02/22 1700 06/02/22 1730 06/02/22 1830  BP: [name])  190/82 (!) 204/74 (!) 209/67 (!) 162/84  Pulse: 77 85 73 64  Resp: '18 18 18 17  '$ Temp:   98.2 F (36.8 C)   TempSrc:   Oral   SpO2: 98% 99% 100% 97%  Weight:      Height:       Physical Exam Vitals and nursing note reviewed.  Constitutional:      General: She is not in acute distress. HENT:     Head: Normocephalic and atraumatic.  Cardiovascular:     Rate and Rhythm: Normal rate and regular rhythm.     Heart sounds: Normal heart sounds.  Pulmonary:     Effort: Pulmonary effort is normal.     Breath sounds: Normal breath sounds.  Abdominal:     Palpations: Abdomen is soft.     Tenderness: There is no abdominal tenderness.  Neurological:     Mental Status: Mental status is at baseline.     Labs on Admission: I have personally reviewed following labs and imaging studies  CBC: Recent Labs  Lab 06/02/22 1442  WBC 11.0*  NEUTROABS 6.9  HGB 10.2*  HCT 33.4*  MCV 81.7  PLT 673   Basic Metabolic Panel: Recent Labs  Lab 06/02/22 1442  NA 141  K 4.8  CL 110  CO2 22  GLUCOSE 117*  BUN 22  CREATININE 1.86*  CALCIUM 9.1   GFR: Estimated Creatinine Clearance: 17 mL/min (A) (by C-G formula based on SCr of 1.86 mg/dL (H)). Liver Function Tests: Recent Labs  Lab 06/02/22 1442  AST 29  ALT 13  ALKPHOS 86  BILITOT 1.1  PROT 7.6  ALBUMIN  2.9*   No results for input(s): "LIPASE", "AMYLASE" in the last 168 hours. No results for input(s): "AMMONIA" in the last 168 hours. Coagulation Profile: No results for input(s): "INR", "PROTIME" in the last 168 hours. Cardiac Enzymes: No results for input(s): "CKTOTAL", "CKMB", "CKMBINDEX", "TROPONINI" in the last 168 hours. BNP (last 3 results) No results for input(s): "PROBNP" in the last 8760 hours. HbA1C: No results for input(s): "HGBA1C" in the last 72 hours. CBG: No results for input(s): "GLUCAP" in the last 168 hours. Lipid Profile: No results for input(s): "CHOL", "HDL", "LDLCALC", "TRIG", "CHOLHDL", "LDLDIRECT" in the last 72 hours. Thyroid Function Tests: No results for input(s): "TSH", "T4TOTAL", "FREET4", "T3FREE", "THYROIDAB" in the last 72 hours. Anemia Panel: No results for input(s): "VITAMINB12", "FOLATE", "FERRITIN", "TIBC", "IRON", "RETICCTPCT" in the last 72 hours. Urine analysis:    Component Value Date/Time   COLORURINE YELLOW (A) 06/02/2022 1508   APPEARANCEUR CLEAR (A) 06/02/2022 1508   APPEARANCEUR Clear 10/27/2017 1508   LABSPEC 1.011 06/02/2022 1508   LABSPEC 1.008 05/11/2012 1400   PHURINE 6.0 06/02/2022 1508   GLUCOSEU NEGATIVE 06/02/2022 1508   GLUCOSEU Negative 05/11/2012 1400   HGBUR NEGATIVE 06/02/2022 1508   BILIRUBINUR NEGATIVE 06/02/2022 1508   BILIRUBINUR Negative 10/27/2017 1508   BILIRUBINUR Negative 05/11/2012 1400   KETONESUR NEGATIVE 06/02/2022 1508   PROTEINUR 30 (A) 06/02/2022 1508   NITRITE NEGATIVE 06/02/2022 1508   LEUKOCYTESUR NEGATIVE 06/02/2022 1508   LEUKOCYTESUR Trace 05/11/2012 1400    Radiological Exams on Admission: CT Chest Wo Contrast  Result Date: 06/02/2022 CLINICAL DATA:  Altered mental status for 1 day, hypoxia, initial encounter EXAM: CT CHEST WITHOUT CONTRAST TECHNIQUE: Multidetector CT imaging of the chest was performed following the standard protocol without IV contrast. RADIATION DOSE REDUCTION: This exam  was performed according to the departmental dose-optimization program which includes automated  exposure control, adjustment of the mA and/or kV according to patient size and/or use of iterative reconstruction technique. COMPARISON:  Chest x-ray from earlier in the same day, CT from 02/28/2021, mammographies from 04/18/2021. FINDINGS: Cardiovascular: Evaluation is limited due to lack of IV contrast. Diffuse atherosclerotic calcifications of the aorta are noted. No aneurysmal dilatation is seen. Heart is at the upper limits of normal in size. Pacing device is noted. Heavy coronary calcifications are seen. No pericardial effusion is noted. Mediastinum/Nodes: Thoracic inlet is within normal limits. Scattered small mediastinal lymph nodes are noted relatively stable from the prior exam and likely reactive in nature. The esophagus as visualized is within normal limits. Lungs/Pleura: Lungs are well aerated bilaterally. Diffuse emphysematous changes are again identified and stable. Some increased nodularity is noted in the anterior aspect of the left upper lobe best seen on image number 29 of series 3 measuring 12 x 12 x 7 mm. This is new from the prior CT examination from June of 2022. No other significant nodular abnormalities are seen. Upper Abdomen: Visualized upper abdomen shows evidence of prior cholecystectomy. Musculoskeletal: No acute rib abnormality is noted. Bilateral breast implants are seen. Considerable soft tissue density is noted anterior to the right breast implant similar to that seen on prior CT and noted on prior mammogram to be related to extracapsular silicone from prior right breast implant rupture. The overall appearance is stable from the prior exam. No significant lymphadenopathy is noted. Degenerative changes of the thoracic spine are noted. Changes of prior vertebral augmentation are seen at T11. T5 compression fracture is noted of uncertain chronicity IMPRESSION: New 10 mm left solid pulmonary  nodule within the upper lobe. Per Fleischner Society Guidelines, consider a non-contrast Chest CT at 3 months, a PET/CT, or tissue sampling. These guidelines do not apply to immunocompromised patients and patients with cancer. Follow up in patients with significant comorbidities as clinically warranted. For lung cancer screening, adhere to Lung-RADS guidelines. Reference: Radiology. 2017; 284(1):228-43. T5 compression deformity of uncertain chronicity. Correlate to point tenderness. It is new from a prior exam of June of 2022. Changes consistent with prior right breast implant rupture with extracapsular silicone anteriorly. This is stable from the prior exam as well as a prior mammogram. Aortic Atherosclerosis (ICD10-I70.0) and Emphysema (ICD10-J43.9). Electronically Signed   By: Inez Catalina M.D.   On: 06/02/2022 19:23   DG Chest Port 1 View  Result Date: 06/02/2022 CLINICAL DATA:  Short of breath. Patient also altered mental status and hallucinations. EXAM: PORTABLE CHEST 1 VIEW COMPARISON:  02/26/2021 and older exams.  CT, 02/28/2021. FINDINGS: Cardiac silhouette mildly enlarged.  No mediastinal or hilar masses. Bilateral irregularly thickened interstitial markings. Emphysematous changes in the upper lobes. No lung consolidation. No convincing pleural effusion and no pneumothorax. Left anterior chest wall dual lead pacemaker is stable. Right internal jugular small caliber dual lumen central venous catheter, tip in the mid superior vena cava, new since the prior exam. Skeletal structures are grossly intact. IMPRESSION: 1. No acute cardiopulmonary disease. 2. Chronic interstitial thickening.  Upper lobe emphysema. Electronically Signed   By: Lajean Manes M.D.   On: 06/02/2022 15:38     Data Reviewed: Relevant notes from primary care and specialist visits, past discharge summaries as available in EHR, including Care Everywhere. Prior diagnostic testing as pertinent to current admission diagnoses Updated  medications and problem lists for reconciliation ED course, including vitals, labs, imaging, treatment and response to treatment Triage notes, nursing and pharmacy notes and ED provider's  notes Notable results as noted in HPI   Assessment and Plan: * Transient neurologic deficit Hallucinations History of prior visual hallucinations 2021 with negative stroke work-up by neurology History of carotid endarterectomy.  Lacunar stroke on CT head 2021 Patient described as having staring episodes in addition to visual hallucinations Possible etiologies include TIA, seizure, medication related (patient started on baclofen on 9/29 by PCP) We will get CT head, as well as echo, EKG and get continuous cardiac monitoring Neurologic checks Hold baclofen Continue rosuvastatin we will add aspirin We will decide on stroke work-up based on results of head CT  Hypertensive urgency SBP over 200 in the ED for which she received labetalol 20 mg Will hold home telmisartan and furosemide due to CKD 4 Hydralazine IV as needed and wean as oral antihypertensives to take effect, once CT head negative for stroke We will start amlodipine and oral hydralazine with goal to keep systolic under 696 once stroke ruled out   Hypoxia O2 sat 88% on room air without shortness of breath Continue supplemental oxygen and wean as tolerated  Chronic osteomyelitis involving ankle and foot, left (HCC) Continue current antibiotics Patient is s/p open ankle fx in a diabetic patient s/p ex-fix --> orif 01/2021 with persistent swelling/pain in ankle, s/p debridement and irrigation 9/1 Followed by ID, currently on the following 66-monthregimen until 06/21/2022 - Amikacin 800 mg three times weekly - Cefoxitin 2gm IV q12h - Linezolid 600 mg PO once daily - Clofazimine 100 mg PO once daily   COPD (chronic obstructive pulmonary disease) [place] Not acutely exacerbated.  Continue albuterol as needed  CAD (coronary artery  disease) History of PCI in 2017 No complaints of chest pain, EKG nonacute Continue rosuvastatin and nitroglycerin as needed chest pain  Acquired hypothyroidism Continue levothyroxine  Type II diabetes mellitus with manifestations (HCC) Continue basal insulin Sliding scale insulin coverage  Pulmonary nodule 1 cm or greater in diameter, left upper lobe Incidental finding on CT chest.  Recommendation to repeat CT in 3 months  Hallucinations History of visual hallucinations in 2021 treated with Risperdal  Continue desvenlafaxine and trazodone   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 No acute issues suspected        DVT prophylaxis: Lovenox  Consults: none  Advance Care Planning:   Code Status: Prior   Family Communication: none  Disposition Plan: Back to previous home environment  Severity of Illness: The appropriate patient status for this patient is INPATIENT. Inpatient status is judged to be reasonable and necessary in order to provide the required intensity of service to ensure the patient's safety. The patient's presenting symptoms, physical exam findings, and initial radiographic and laboratory data in the context of their chronic comorbidities is felt to place them at high risk for further clinical deterioration. Furthermore, it is not anticipated that the patient will be medically stable for discharge from the hospital within 2 midnights of admission.   * I certify that at the point of admission it is my clinical judgment that the patient will require inpatient hospital care spanning beyond 2 midnights from the point of admission due to high intensity of service, high risk for further deterioration and high frequency of surveillance required.*  Author: HAthena Masse MD 06/02/2022 8:56 PM  For on call review [email or web address]

## 2022-06-02 NOTE — Assessment & Plan Note (Signed)
SBP over 200 in the ED for which she received labetalol 20 mg Will hold home telmisartan and furosemide due to CKD 4 Hydralazine IV as needed and wean as oral antihypertensives to take effect, once CT head negative for stroke We will start amlodipine and oral hydralazine with goal to keep systolic under 638 once stroke ruled out

## 2022-06-02 NOTE — Assessment & Plan Note (Addendum)
History of visual hallucinations in 2021 treated with Risperdal  Continue desvenlafaxine and trazodone

## 2022-06-02 NOTE — ED Provider Notes (Signed)
[facility] Provider Note   Event Date/Time   First MD Initiated Contact with Patient 06/02/22 1441     (approximate) History  Altered Mental Status  HPI Cassandra [name] is a 88 y.o. female with a stated past medical history of osteomyelitis in the left lower extremity and currently on IV antibiotics 3 times a week who presents for altered mental status that began today.  EMS states that they were called for patient "not acting right" and when they got there patient had episodes where she was staring off into space but was able to answer questions.  Patient never lost pulses or stopped breathing. Pt denies any pain or complaints at this time. ROS: Patient currently denies any vision changes, tinnitus, difficulty speaking, facial droop, sore throat, chest pain, shortness of breath, abdominal pain, nausea/vomiting/diarrhea, dysuria, or weakness/numbness/paresthesias in any extremity   Physical Exam  Triage Vital Signs: ED Triage Vitals  Enc Vitals Group     BP      Pulse      Resp      Temp      Temp src      SpO2      Weight      Height      Head Circumference      Peak Flow      Pain Score      Pain Loc      Pain Edu?      Excl. in [place]?    Most recent vital signs: Vitals:   06/02/22 1446 06/02/22 1530  BP: (!) 164/85 (!) 204/89  Pulse: 89 86  Resp: 18 15  Temp: 98.1 F (36.7 C)   SpO2: 96% 95%   General: Awake, oriented to person and situation but not to time or place CV:  Good peripheral perfusion.  Resp:  Normal effort.  Abd:  No distention.  Other:  Elderly Caucasian female laying in bed in no acute distress.  There are healing surgical wounds to lateral and medial aspect of the left lower extremity without any surrounding erythema or induration.  No purulent drainage from these wounds ED Results / Procedures / Treatments  Labs (all labs ordered are listed, but only abnormal results are displayed) Labs Reviewed  COMPREHENSIVE METABOLIC  PANEL - Abnormal; Notable for the following components:      Result Value   Glucose, Bld 117 (*)    Creatinine, Ser 1.86 (*)    Albumin 2.9 (*)    GFR, Estimated 26 (*)    All other components within normal limits  CBC WITH DIFFERENTIAL/PLATELET - Abnormal; Notable for the following components:   WBC 11.0 (*)    Hemoglobin 10.2 (*)    HCT 33.4 (*)    MCH 24.9 (*)    RDW 21.5 (*)    Monocytes Absolute 1.1 (*)    All other components within normal limits  BLOOD GAS, ARTERIAL - Abnormal; Notable for the following components:   pH, Arterial 7.47 (*)    All other components within normal limits  RESP PANEL BY RT-PCR (FLU A&B, COVID) [id number]  LACTIC ACID, PLASMA  URINALYSIS, ROUTINE W REFLEX MICROSCOPIC  LACTIC ACID, PLASMA   EKG ED ECG REPORT I, Naaman Plummer, the attending physician, personally viewed and interpreted this ECG. Date: 06/02/2022 EKG Time: 1529 Rate: 90 Rhythm: normal sinus rhythm QRS Axis: normal Intervals: Right bundle branch block, left anterior fascicular block ST/T Wave abnormalities: normal Narrative Interpretation: Normal sinus rhythm with RBBB  and LAFB.  No evidence of acute ischemia RADIOLOGY ED MD interpretation: One-view portable chest x-ray interpreted by me shows no evidence of acute abnormalities including no pneumonia, pneumothorax, or widened mediastinum -Agree with radiology assessment Official radiology report(s): DG Chest Port 1 View  Result Date: 06/02/2022 CLINICAL DATA:  Short of breath. Patient also altered mental status and hallucinations. EXAM: PORTABLE CHEST 1 VIEW COMPARISON:  02/26/2021 and older exams.  CT, 02/28/2021. FINDINGS: Cardiac silhouette mildly enlarged.  No mediastinal or hilar masses. Bilateral irregularly thickened interstitial markings. Emphysematous changes in the upper lobes. No lung consolidation. No convincing pleural effusion and no pneumothorax. Left anterior chest wall dual lead pacemaker is stable. Right internal  jugular small caliber dual lumen central venous catheter, tip in the mid superior vena cava, new since the prior exam. Skeletal structures are grossly intact. IMPRESSION: 1. No acute cardiopulmonary disease. 2. Chronic interstitial thickening.  Upper lobe emphysema. Electronically Signed   By: Lajean Manes M.D.   On: 06/02/2022 15:38   PROCEDURES: Critical Care performed: No Procedures MEDICATIONS ORDERED IN ED: Medications  lactated ringers bolus 1,000 mL (1,000 mLs Intravenous New Bag/Given 06/02/22 1543)   IMPRESSION / MDM / ASSESSMENT AND PLAN / ED COURSE  I reviewed the triage vital signs and the nursing notes.                             Differential diagnosis includes, but is not limited to, metabolic encephalopathy, urosepsis, CVA The patient is on the cardiac monitor to evaluate for evidence of arrhythmia and/or significant heart rate changes. Patient's presentation is most consistent with acute presentation with potential threat to life or bodily function. Patient presents for altered mental status of unknown origin  Will obtain medical workup and discuss with social work to try to obtain collateral information.  Given History, Physical, and Workup there is no overt concern for a dangerous emergent cause such as, but not limited to, CNS infection, severe Toxidrome, severe metabolic derangement, or stroke.  Care of this patient will be signed out to the oncoming physician at the end of my shift.  All pertinent patient information conveyed and all questions answered.  All further care and disposition decisions will be made by the oncoming physician.   FINAL CLINICAL IMPRESSION(S) / ED DIAGNOSES   Final diagnoses:  Altered mental status, unspecified altered mental status type   Rx / DC Orders   ED Discharge Orders     None      Note:  This document was prepared using Dragon voice recognition software and may include unintentional dictation errors.   Naaman Plummer,  MD 06/02/22 [phone]
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
## 2022-06-02 NOTE — ED Provider Notes (Signed)
Procedures     ----------------------------------------- 8:05 PM on 06/02/2022 ----------------------------------------- Work-up reassuring.  Initial chest x-ray was nondiagnostic but patient is persistently hypoxic to 88% on room air which is a new oxygen requirement for her, so CT chest was obtained, without contrast due to GFR of 26.  This does not show any acute findings such as pneumonia.  She does clearly have emphysema.  On exam, expiratory phase is normal and there is not pronounced wheezing.  I will start doxycycline for antibiotic prophylaxis, but suspect that her symptoms today may be adverse effects of baclofen.  Case discussed with hospitalist for further evaluation.    Carrie Mew, MD 06/02/22 2006
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
## 2022-06-02 NOTE — Assessment & Plan Note (Signed)
Incidental finding on CT chest.  Recommendation to repeat CT in 3 months

## 2022-06-02 NOTE — Assessment & Plan Note (Addendum)
Hallucinations History of prior visual hallucinations 2021 with negative stroke work-up by neurology History of carotid endarterectomy.  Lacunar stroke on CT head 2021 patient described as having staring episodes in addition to visual hallucinations Possibly triggered by baclofen started on 9/29 by PCP for persistent pain in left leg We will get CT head Neurologic checks Hold baclofen Continue rosuvastatin we will add aspirin We will decide on stroke work-up based on results of head CT

## 2022-06-02 NOTE — ED Notes (Signed)
Pt attempting to get out of bed again, fall alarm in place. Pt informed again not to get out of bed without assist. Pt states she just wanted to get up and "see what's going on". Pt informed she is in the hospital. Pt states "I know that darling".

## 2022-06-02 NOTE — ED Notes (Signed)
Pt again attempting to get out of bed, pt denies needs, pt back to bed, head of bed lowered, pt encouraged to sleep.

## 2022-06-02 NOTE — Assessment & Plan Note (Signed)
O2 sat 88% on room air without shortness of breath Continue supplemental oxygen and wean as tolerated

## 2022-06-03 ENCOUNTER — Inpatient Hospital Stay: Payer: Medicare Other

## 2022-06-03 ENCOUNTER — Encounter: Payer: Self-pay | Admitting: Internal Medicine

## 2022-06-03 ENCOUNTER — Inpatient Hospital Stay
Admit: 2022-06-03 | Discharge: 2022-06-03 | Disposition: A | Discharge status: Home / Self Care | Payer: Medicare Other | Attending: Internal Medicine | Admitting: Internal Medicine

## 2022-06-03 DIAGNOSIS — I160 Hypertensive urgency: Secondary | ICD-10-CM

## 2022-06-03 DIAGNOSIS — R0902 Hypoxemia: Secondary | ICD-10-CM

## 2022-06-03 DIAGNOSIS — R443 Hallucinations, unspecified: Secondary | ICD-10-CM

## 2022-06-03 DIAGNOSIS — R29818 Other symptoms and signs involving the nervous system: Secondary | ICD-10-CM | POA: Diagnosis not present

## 2022-06-03 LAB — ECHOCARDIOGRAM COMPLETE
AR max vel: 1.78 cm2
AV Area VTI: 1.75 cm2
AV Area mean vel: 1.66 cm2
AV Mean grad: 2.3 mmHg
AV Peak grad: 4.4 mmHg
Ao pk vel: 1.04 m/s
Area-P 1/2: 3.02 cm2
Height: 59 in
S' Lateral: 2.5 cm
Weight: 2158.74 oz

## 2022-06-03 LAB — LIPID PANEL
Cholesterol: 128 mg/dL (ref 0–200)
HDL: 40 mg/dL — ABNORMAL LOW (ref >40)
LDL Cholesterol: 60 mg/dL (ref 0–99)
Total CHOL/HDL Ratio: 3.2 RATIO
Triglycerides: 142 mg/dL (ref <150)
VLDL: 28 mg/dL (ref 0–40)

## 2022-06-03 LAB — CBG MONITORING, ED
Glucose-Capillary: 152 mg/dL — ABNORMAL HIGH (ref 70–99)
Glucose-Capillary: 215 mg/dL — ABNORMAL HIGH (ref 70–99)
Glucose-Capillary: 145 mg/dL — ABNORMAL HIGH (ref 70–99)

## 2022-06-03 LAB — CREATININE, SERUM
Creatinine, Ser: 1.66 mg/dL — ABNORMAL HIGH (ref 0.44–1.00)
GFR, Estimated: 30 mL/min — ABNORMAL LOW (ref >60)

## 2022-06-03 LAB — GLUCOSE, CAPILLARY: Glucose-Capillary: 101 mg/dL — ABNORMAL HIGH (ref 70–99)

## 2022-06-03 MED ORDER — NON FORMULARY: 100.0000 mg | Freq: Every day | Status: DC

## 2022-06-03 MED ORDER — DESVENLAFAXINE SUCCINATE ER 50 MG PO TB24
50.0000 mg | ORAL_TABLET | Freq: Every day | ORAL | Status: DC
  Administered 2022-06-03 – 2022-06-04 (×2): 50 mg via ORAL
  Filled 2022-06-03 (×2): qty 1

## 2022-06-03 MED ORDER — CLOFAZIMINE POWD
100.0000 mg | Freq: Every day | Status: DC
  Administered 2022-06-03 – 2022-06-04 (×2): 100 mg via ORAL
  Filled 2022-06-03 (×2): qty 2

## 2022-06-03 NOTE — ED Notes (Signed)
Pt attempting to get out of bed, pt placed back in bed. Pt denies needs.

## 2022-06-03 NOTE — ED Notes (Signed)
Pt resting in bed.

## 2022-06-03 NOTE — ED Notes (Signed)
Pt moved to hospital bed for comfort. New pure wick placed.

## 2022-06-03 NOTE — Progress Notes (Signed)
*  PRELIMINARY RESULTS* Echocardiogram 2D Echocardiogram has been performed.  Cassandra [name] 06/03/2022, 1:34 PM

## 2022-06-03 NOTE — ED Notes (Signed)
Pt sleeping. 

## 2022-06-03 NOTE — ED Notes (Signed)
Report to kate, rn.  

## 2022-06-03 NOTE — ED Notes (Signed)
Pt desatting on monitor. This RN to bedside. Pt asleep, oxygen saturation 78% with a good pleth and sustained. This RN woke pt up and placed on 2L [place], stayed at bedside until O2 saturation returned to favorable range. Pt remains on 2L [place] at this time with saturation 99%. Provider aware.

## 2022-06-03 NOTE — ED Notes (Addendum)
Will hold hydralazine until pt awake, pt is too sleepy to safely swallow a pill at this time.

## 2022-06-03 NOTE — ED Notes (Signed)
Lab notified for need for venipuncture assist.

## 2022-06-03 NOTE — Procedures (Signed)
Patient Name: Cassandra [name]  MRN: [id number]  Epilepsy Attending: Lora Havens  Referring Physician/Provider: Athena Masse, MD  Date: 06/03/2022 Duration: 26.10 mins  Patient history: 88yo f with ams. EEG to evaluate for seizure  Level of alertness: Awake  AEDs during EEG study: None  Technical aspects: This EEG study was done with scalp electrodes positioned according to the 10-20 International system of electrode placement. Electrical activity was reviewed with band pass filter of 1-'70Hz'$ , sensitivity of 7 uV/mm, display speed of 68m/sec with a '60Hz'$  notched filter applied as appropriate. EEG data were recorded continuously and digitally stored.  Video monitoring was available and reviewed as appropriate.  Description: The posterior dominant rhythm consists of 9 Hz activity of moderate voltage (25-35 uV) seen predominantly in posterior head regions, symmetric and reactive to eye opening and eye closing. EEG showed intermittent generalized 3-'5hz'$  theta- delta slowing, at times with triphasic morphology. Hyperventilation and photic stimulation were not performed.     ABNORMALITY - Intermittent slow, generalized  IMPRESSION: This study is suggestive of mild diffuse encephalopathy, nonspecific etiology. No seizures or epileptiform discharges were seen throughout the recording.  Able Malloy OBarbra Sarks

## 2022-06-03 NOTE — Progress Notes (Signed)
Eeg done 

## 2022-06-03 NOTE — ED Notes (Signed)
EEG at this time 
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
## 2022-06-03 NOTE — Progress Notes (Signed)
OT Cancellation Note  Patient Details Name: Cassandra [name] MRN: [id number] DOB: Feb 14, 1933   Cancelled Treatment:    Reason Eval/Treat Not Completed: Other (comment);Fatigue/lethargy limiting ability to participate. Consult received, chart reviewed. Upon attempt, pt sleeping soundly. Spouse present and provides additional PLOF information. Spouse requests that pt continue sleeping for now and agreeable to OT re-attempt at later date/time once pt has rested.   Ardeth Perfect., MPH, MS, OTR/L ascom [phone] 06/03/22, 11:26 AM

## 2022-06-03 NOTE — Progress Notes (Addendum)
Progress Note    Cassandra [name]  JME:[id number] DOB: 08/26/1933  DOA: 06/02/2022 PCP: Idelle Crouch, MD      Brief Narrative:    Medical records reviewed and are as summarized below:  Cassandra [name] is a 88 y.o. female  with medical history significant for CAD with PCI to LAD 2017, PPM for symptomatic bradycardia, frequent falls, CKD 3b, IDDM, hypothyroidism, HTN, COPD, chronically infected hardware left ankle from an open fracture back in February 2023, currently on a 34-monthantibiotic course, followed by ID, and with prior history of visual hallucinations in February 2021 who was brought to the ER with a 1 day history of visual hallucinations, unsteady gait and altered mental status.  She was recently started on baclofen on 05/31/2022 by PCP for acute bilateral thoracic back pain.    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   * Transient neurologic deficit, unsteady gait, hallucinations History of prior visual hallucinations 2021 with negative stroke work-up by neurology History of carotid endarterectomy.  Lacunar stroke on CT head 2021 CT head was negative for acute stroke. She was recently started on baclofen on 05/31/2022 which can cause confusion, hallucinations and unsteady gait Symptoms could be related to baclofen.   This has been discontinued. PT recommended home health therapy   Hypertensive urgency BP has improved.  Continue antihypertensives     COPD, OSA, chronic hypoxic respiratory failure She has saturation dropped while she was sleeping.  She said she used to use oxygen at night but she stopped using it and has returned the oxygen tank.  She is supposed to use CPAP at night but she has not been using it.  Patient has been encouraged to use CPAP for OSA.    Chronic osteomyelitis involving ankle and foot, left (HCC) Continue current antibiotics Patient is s/p open ankle fx in a diabetic patient s/p ex-fix --> orif 01/2021 with persistent swelling/pain in ankle, s/p debridement and irrigation 9/1 Followed by ID, currently on the following 350-monthegimen until 06/21/2022 - Amikacin 800 mg three times weekly - Cefoxitin 2gm IV q12h - Linezolid 600 mg PO once daily - Clofazimine 100 mg PO once daily     CAD (coronary artery disease) History of PCI in 2017 No complaints of chest pain, EKG nonacute Continue rosuvastatin and nitroglycerin as needed chest pain   Acquired hypothyroidism Continue levothyroxine   Type II diabetes mellitus with manifestations ([place]ustaContinue basal insulin Sliding scale insulin coverage   Pulmonary nodule 1 cm or greater in diameter, left upper lobe Incidental finding on CT chest.  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 Yes; Fluid consistency: Thin  Diet effective now                            Consultants: None  Procedures: None    Medications:     stroke: early stages of recovery book   Does not apply Once   amLODipine  5 mg Oral Daily    desvenlafaxine  50 mg Oral Daily   enoxaparin (LOVENOX) injection  30 mg Subcutaneous Q24H   furosemide  20 mg Oral Daily   hydrALAZINE  25 mg Oral Q8H   insulin aspart  0-15 Units Subcutaneous TID WC   insulin aspart  0-5 Units Subcutaneous QHS   insulin glargine-yfgn  10 Units Subcutaneous Daily   linezolid  600 mg Oral Q24H   rosuvastatin  5 mg Oral QHS   Continuous Infusions:  [START ON 06/04/2022] amikacin (AMIKIN) 800 mg in dextrose 5 % 100 mL IVPB     cefOXitin Stopped (06/03/22 1001)     Anti-infectives (From admission, onward)    Start     Dose/Rate Route Frequency Ordered Stop   06/04/22 1200  amikacin (AMIKIN) 800 mg in dextrose 5 % 100 mL IVPB        800 mg 103.2 mL/hr over 60 Minutes Intravenous Once per day on Tue Thu Sat 06/02/22 2228     06/02/22 2230  cefOXitin (MEFOXIN) 2 g in sodium chloride 0.9 % 100 mL IVPB        2 g 200 mL/hr over 30 Minutes Intravenous Every 12 hours 06/02/22 2228     06/02/22 2223  linezolid (ZYVOX) tablet 600 mg        600 mg Oral Every 24 hours 06/02/22 2228     06/02/22 1930  doxycycline (VIBRA-TABS) tablet 100 mg  Status:  Discontinued        100 mg Oral Every 12 hours 06/02/22 1920 06/02/22 2228              Family Communication/Anticipated D/C date and plan/Code Status   DVT prophylaxis: enoxaparin (LOVENOX) injection 30 mg Start: 06/02/22 2115     Code Status: Full Code  Family Communication: Plan discussed with the husband at the bedside Disposition Plan: Plan to discharge home tomorrow   Status is: Inpatient Remains inpatient appropriate because: Change in mental status, unsteady gait       Subjective:   Interval events noted.  Patient was asleep when I walked into her room.  She woke up and spoke to me.  She is oriented but her speech is a little slurred.  Her husband was at the bedside he thinks that she is much better.  Objective:    Vitals:   06/03/22 0830 06/03/22 0914 06/03/22 0915 06/03/22 1200   BP: (!) 113/53  (!) 182/63 (!) 160/64  Pulse: 60   68  Resp: 20   14  Temp:  (!) 96.8 F (36 C)    TempSrc:  Axillary    SpO2: 98%   98%  Weight:      Height:       No data found.   Intake/Output Summary (Last 24 hours) at 06/03/2022 1235 Last data filed at 06/03/2022 1001 Gross per 24 hour  Intake 400 ml  Output 800 ml  Net -400 ml   Filed Weights   06/02/22 1447  Weight: 61.2 kg    Exam:  GEN: NAD SKIN: Warm and dry EYES: No pallor or icterus ENT: MMM CV: RRR PULM: CTA B ABD: soft, ND, NT, +BS CNS: AAO x 3, non focal, slurred speech EXT: Left medial malleolus wound with swelling around the ankle        Data Reviewed:   I have personally reviewed following labs and imaging studies:  Labs: Labs show the following:   Basic Metabolic Panel: Recent Labs  Lab 06/02/22 1442 06/03/22 0615  NA 141  --   K 4.8  --   CL 110  --   CO2 22  --   GLUCOSE 117*  --   BUN 22  --   CREATININE 1.86* 1.66*  CALCIUM 9.1  --    GFR Estimated Creatinine Clearance: 19 mL/min (A) (by C-G formula based on SCr of 1.66 mg/dL (H)). Liver Function Tests: Recent Labs  Lab 06/02/22 1442  AST 29  ALT 13  ALKPHOS 86  BILITOT 1.1  PROT 7.6  ALBUMIN 2.9*   No results for input(s): "LIPASE", "AMYLASE" in the last 168 hours. No results for input(s): "AMMONIA" in the last 168 hours. Coagulation profile No results for input(s): "INR", "PROTIME" in the last 168 hours.  CBC: Recent Labs  Lab 06/02/22 1442  WBC 11.0*  NEUTROABS 6.9  HGB 10.2*  HCT 33.4*  MCV 81.7  PLT 301   Cardiac Enzymes: No results for input(s): "CKTOTAL", "CKMB", "CKMBINDEX", "TROPONINI" in the last 168 hours. BNP (last 3 results) No results for input(s): "PROBNP" in the last 8760 hours. CBG: Recent Labs  Lab 06/02/22 2126 06/03/22 0811 06/03/22 1155  GLUCAP 229* 152* 215*   D-Dimer: No results for input(s): "DDIMER" in the last 72 hours. Hgb A1c: No results for input(s): "HGBA1C"  in the last 72 hours. Lipid Profile: Recent Labs    06/03/22 0615  CHOL 128  HDL 40*  LDLCALC 60  TRIG 142  CHOLHDL 3.2   Thyroid function studies: No results for input(s): "TSH", "T4TOTAL", "T3FREE", "THYROIDAB" in the last 72 hours.  Invalid input(s): "FREET3" Anemia work up: No results for input(s): "VITAMINB12", "FOLATE", "FERRITIN", "TIBC", "IRON", "RETICCTPCT" in the last 72 hours. Sepsis Labs: Recent Labs  Lab 06/02/22 1442  WBC 11.0*  LATICACIDVEN 1.3    Microbiology Recent Results (from the past 240 hour(s))  Resp Panel by RT-PCR (Flu A&B, Covid) Anterior Nasal Swab     Status: None   Collection Time: 06/02/22  2:42 PM   Specimen: Anterior Nasal Swab  Result Value Ref Range Status   SARS Coronavirus 2 by RT PCR NEGATIVE NEGATIVE Final    Comment: (NOTE) SARS-CoV-2 target nucleic acids are NOT DETECTED.  The SARS-CoV-2 RNA is generally detectable in upper respiratory specimens during the acute phase of infection. The lowest concentration of SARS-CoV-2 viral copies this assay can detect is 138 copies/mL. A negative result does not preclude SARS-Cov-2 infection and should not be used as the sole basis for treatment or other patient management decisions. A negative result may occur with  improper specimen collection/handling, submission of specimen other than nasopharyngeal swab, presence of viral mutation(s) within the areas targeted by this assay, and inadequate number of viral copies(<138 copies/mL). A negative result must be combined with clinical observations, patient history, and epidemiological information. The expected result is Negative.  Fact Sheet for Patients:  EntrepreneurPulse.com.au  Fact Sheet for Healthcare Providers:  IncredibleEmployment.be  This test is no t yet approved or cleared by the  [place] [employer] and  has been authorized for detection and/or diagnosis of SARS-CoV-2 by FDA under an Actuary (EUA). This EUA will remain  in effect (meaning this test can be used) for the duration of the COVID-19 declaration under Section 564(b)(1) of the Act, 21 U.S.C.section 360bbb-3(b)(1), unless the authorization is terminated  or revoked sooner.       Influenza A by PCR NEGATIVE NEGATIVE Final   Influenza B by PCR NEGATIVE NEGATIVE Final    Comment: (NOTE) The Xpert Xpress SARS-CoV-2/FLU/RSV plus assay is intended as an aid in the diagnosis of influenza from Nasopharyngeal swab specimens and should not be used as a sole basis for treatment. Nasal washings and aspirates are unacceptable for Xpert Xpress SARS-CoV-2/FLU/RSV testing.  Fact Sheet for Patients: EntrepreneurPulse.com.au  Fact Sheet for Healthcare Providers: IncredibleEmployment.be  This test is not yet approved or cleared by the [place] FDA and has been authorized for detection and/or diagnosis of SARS-CoV-2 by FDA under an Emergency Use Authorization (EUA). This EUA will remain in effect (meaning this test can be used) for the duration of the COVID-19 declaration under Section 564(b)(1) of the Act, 21 U.S.C. section 360bbb-3(b)(1), unless the authorization is terminated or revoked.  Performed at [facility], [address] [place], [place] [address]     Procedures and diagnostic studies:  US Carotid Bilateral (at [facility] and [facility] only)  Result Date: 06/03/2022 CLINICAL DATA:  TIA Hypertension Hyperlipidemia Diabetes EXAM: BILATERAL CAROTID DUPLEX ULTRASOUND TECHNIQUE: Pearline Cables scale imaging, color Doppler and duplex ultrasound were performed of bilateral carotid and vertebral arteries in the neck. COMPARISON:  10/05/2021 FINDINGS: Criteria: Quantification of carotid stenosis is based on velocity parameters that correlate the residual internal carotid diameter with NASCET-based stenosis levels, using the diameter of the distal internal carotid lumen as the  denominator for stenosis measurement. The following velocity measurements were obtained: RIGHT ICA: 129/20 cm/sec CCA: 09/7 cm/sec SYSTOLIC ICA/CCA RATIO:  1.5 ECA: 100 cm/sec LEFT ICA: 7/68 cm/sec CCA: 35/32 cm/sec SYSTOLIC ICA/CCA RATIO:  1.1 ECA: 53 cm/sec RIGHT CAROTID ARTERY: Moderate amount of calcified plaque seen in the distal left common carotid extending into the proximal internal carotid artery. RIGHT VERTEBRAL ARTERY:  Antegrade flow. LEFT CAROTID ARTERY: Bulky calcified plaque seen in the proximal left common carotid artery. LEFT VERTEBRAL ARTERY:  Antegrade flow. IMPRESSION: 1. Bulky calcified plaque seen in the proximal left common carotid artery. Less than 50% stenosis of the left internal carotid artery. 2. Moderate amount of calcified plaque seen in the distal right common carotid artery extending into the proximal right internal carotid artery results in 50-69% stenosis by Doppler criteria. Electronically Signed   By: Miachel Roux M.D.   On: 06/03/2022 11:33   CT HEAD WO CONTRAST (5MM)  Result Date: 06/02/2022 CLINICAL DATA:  Altered mental status EXAM: CT HEAD WITHOUT CONTRAST TECHNIQUE: Contiguous axial images were obtained from the base of the skull through the vertex without intravenous contrast. RADIATION DOSE REDUCTION: This exam was performed according to the departmental dose-optimization program which includes automated exposure control, adjustment of the mA and/or kV according to patient size and/or use of iterative reconstruction technique. COMPARISON:  02/25/2021 FINDINGS: Brain: No evidence of acute infarction, hemorrhage, hydrocephalus, extra-axial collection or mass lesion/mass effect. Subcortical white matter and periventricular small vessel ischemic changes. Old left cerebellar infarct. Vascular: Intracranial atherosclerosis. Skull: Normal. Negative for fracture or focal lesion. Sinuses/Orbits: The visualized paranasal sinuses are essentially clear. The mastoid air cells are  unopacified. Other:  None. IMPRESSION: No acute intracranial abnormality. Small vessel ischemic changes. Old left cerebellar infarct. Electronically Signed   By: Julian Hy M.D.   On: 06/02/2022 20:58   CT Chest Wo Contrast  Result Date: 06/02/2022 CLINICAL DATA:  Altered mental status for 1 day, hypoxia, initial encounter EXAM: CT CHEST WITHOUT CONTRAST TECHNIQUE: Multidetector CT imaging of the chest was performed following the standard protocol without IV contrast. RADIATION DOSE REDUCTION: This exam was performed according to the departmental dose-optimization program which includes automated exposure control, adjustment of the mA and/or kV according to patient size and/or use of iterative reconstruction technique. COMPARISON:  Chest x-ray from earlier in the same day, CT from 02/28/2021, mammographies from 04/18/2021. FINDINGS: Cardiovascular: Evaluation is limited due to lack of IV contrast. Diffuse atherosclerotic calcifications of the aorta are noted. No aneurysmal dilatation is seen. Heart is at the upper limits of normal in size. Pacing device is noted. Heavy coronary calcifications are seen. No pericardial effusion is noted. Mediastinum/Nodes: Thoracic inlet is within normal limits. Scattered small mediastinal lymph nodes are noted relatively stable from the prior exam and likely reactive in nature. The esophagus as visualized is within normal limits. Lungs/Pleura: Lungs are well aerated bilaterally. Diffuse emphysematous changes are again identified and stable. Some increased nodularity is noted in the anterior aspect of the left upper lobe best seen on image number 29 of series 3 measuring 12 x 12 x 7 mm. This is new from the prior CT examination from June of 2022. No other significant nodular abnormalities are seen. Upper Abdomen: Visualized upper abdomen shows evidence of prior cholecystectomy. Musculoskeletal: No acute rib abnormality is noted. Bilateral breast implants are seen.  Considerable soft tissue density is noted anterior to the right breast implant similar to that seen on prior CT and noted on prior mammogram to be related to extracapsular silicone from prior right breast implant rupture. The overall appearance is stable from the prior exam. No significant lymphadenopathy is noted. Degenerative changes of the thoracic spine are noted. Changes of prior vertebral augmentation are seen at T11. T5 compression fracture is noted of uncertain chronicity IMPRESSION: New 10 mm left solid pulmonary nodule within the upper lobe. Per Fleischner Society Guidelines, consider a non-contrast Chest CT at 3 months, a PET/CT, or tissue sampling. These guidelines do not apply to immunocompromised patients and patients with cancer. Follow up in patients with significant comorbidities as clinically warranted. For lung cancer screening, adhere to Lung-RADS guidelines. Reference: Radiology. 2017; 284(1):228-43. T5 compression deformity of uncertain chronicity. Correlate to point tenderness. It is new from a prior exam of June of 2022. Changes consistent with prior right breast implant rupture with extracapsular silicone anteriorly. This is stable from the prior exam as well as a prior mammogram. Aortic Atherosclerosis (ICD10-I70.0) and Emphysema (ICD10-J43.9). Electronically Signed   By: Inez Catalina M.D.   On: 06/02/2022 19:23   DG Chest Port 1 View  Result Date: 06/02/2022 CLINICAL DATA:  Short of breath. Patient also altered mental status and hallucinations. EXAM: PORTABLE CHEST 1 VIEW COMPARISON:  02/26/2021 and older exams.  CT, 02/28/2021. FINDINGS: Cardiac silhouette mildly enlarged.  No mediastinal or hilar masses. Bilateral irregularly thickened interstitial markings. Emphysematous changes in the upper lobes. No lung consolidation. No convincing pleural effusion and no pneumothorax. Left anterior chest wall dual lead pacemaker is stable. Right internal jugular small caliber dual lumen central  venous catheter, tip in the mid superior vena cava, new since the prior exam. Skeletal structures are grossly intact. IMPRESSION:  1. No acute cardiopulmonary disease. 2. Chronic interstitial thickening.  Upper lobe emphysema. Electronically Signed   By: Lajean Manes M.D.   On: 06/02/2022 15:38               LOS: 1 day   Latoiya Maradiaga  [facility]   Pager on [email or web address]. If 7PM-7AM, please contact night-coverage at [email or web address]     06/03/2022, 12:35 PM

## 2022-06-03 NOTE — ED Notes (Signed)
Pt resting in bed w/eyes closed, respirations even and unlabored.

## 2022-06-03 NOTE — ED Notes (Signed)
Pt repositioned in bed for comfort and placed in high fowlers position to eat. Meal tray provided by dietary. Husband at bedside at this time. Call light is in reach, [facility].

## 2022-06-03 NOTE — ED Notes (Signed)
Pt again attempting to get up. Pt denies needs, pt encouraged to return to bed, covered pt with warm blankets.

## 2022-06-04 DIAGNOSIS — G929 Unspecified toxic encephalopathy: Secondary | ICD-10-CM

## 2022-06-04 LAB — GLUCOSE, CAPILLARY
Glucose-Capillary: 111 mg/dL — ABNORMAL HIGH (ref 70–99)
Glucose-Capillary: 125 mg/dL — ABNORMAL HIGH (ref 70–99)
Glucose-Capillary: 153 mg/dL — ABNORMAL HIGH (ref 70–99)

## 2022-06-04 LAB — CREATININE, SERUM
Creatinine, Ser: 1.7 mg/dL — ABNORMAL HIGH (ref 0.44–1.00)
GFR, Estimated: 29 mL/min — ABNORMAL LOW (ref >60)

## 2022-06-04 MED ORDER — AMIKACIN IV (FOR PTA / DISCHARGE USE ONLY): 800.0000 mg | INTRAVENOUS | 0 refills | Status: AC

## 2022-06-04 MED ORDER — DEXTROSE 5 % IV SOLN: 2.0000 g | Freq: Two times a day (BID) | INTRAVENOUS | 0 refills | Status: AC

## 2022-06-04 MED ORDER — LANTUS SOLOSTAR 100 UNIT/ML ~~LOC~~ SOPN: 8.0000 [IU] | PEN_INJECTOR | Freq: Every day | SUBCUTANEOUS | Status: DC

## 2022-06-04 NOTE — Progress Notes (Signed)
Discharge instructions reviewed with patient including followup visits and new medications.  Understanding was verbalized and all questions were answered.  IV removed without complication; patient tolerated well.  Patient discharged home via wheelchair in stable condition escorted by nursing staff.  

## 2022-06-04 NOTE — Discharge Summary (Incomplete)
Physician Discharge Summary   Patient: Cassandra [name] MRN: [id number] DOB: 28-Aug-1933  Admit date:     06/02/2022  Discharge date: {dischdate:26783}  Discharge Physician: Cassandra [name]   PCP: Cassandra Crouch, MD   Recommendations at discharge:  {Tip this will not be part of the note when signed- Example include specific recommendations for outpatient follow-up, pending tests to follow-up on. 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 [facility] Course:       Patient was scheduled to see the Neurologist back on 10/04/19 and cancelled this appointment due to being sick. Daughter Cassandra [name]) states that the patient has falling twice and hit her head since cancelling that appointment. Daughter has concerns that the patient may have the start of early Dementia as the patient is seeing things that are not there. (like bugs in her food, fish tank in [employer], snakes in her front yard ) Daughter is asking Cassandra [name] as to what she needs to do to get her checked out. She states if she needs an appointment to make them on Monday or Wednesday mornings. Please adviseHead CT ordered. Labs today. Begin Risperdal. Neuro consult as scheduled. 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  Lacunar stroke on CT head 2021 Patient described as having staring episodes in addition to visual hallucinations Possible etiologies include TIA, seizure, medication related (patient started on baclofen on 9/29 by PCP) We will get CT head, as well as echo, EKG and get continuous cardiac monitoring Neurologic checks Hold baclofen Continue rosuvastatin we will add aspirin We will decide on stroke work-up based on results of head CT  Hypertensive urgency SBP over 200 in the ED for which she received labetalol 20 mg Will hold home telmisartan and furosemide due to CKD 4 Hydralazine IV as needed and wean as oral antihypertensives to take effect, once CT head negative for stroke We will start amlodipine and oral hydralazine with goal to keep systolic under 025 once stroke ruled out   Hypoxia O2 sat 88% on room air without shortness of breath Continue supplemental oxygen and wean as tolerated  Chronic osteomyelitis involving ankle and foot, left (HCC) Continue current antibiotics Patient is s/p open ankle fx in a diabetic patient s/p ex-fix --> orif 01/2021 with persistent swelling/pain in ankle,  s/p debridement and irrigation 9/1 Followed by ID, currently on the following 66-monthregimen until 06/21/2022 - Amikacin 800 mg three times weekly - Cefoxitin 2gm IV q12h - Linezolid 600 mg PO once daily - Clofazimine 100 mg PO once daily   COPD [place]  obstructive [place] disease[place] Not acutely exacerbated.  Continue albuterol as needed  CAD (coronary artery disease) History of PCI in 2017 No complaints of chest pain, EKG nonacute Continue rosuvastatin and nitroglycerin as needed chest pain  Acquired hypothyroidism Continue levothyroxine  Type II diabetes mellitus with manifestations (HCC) Continue basal insulin Sliding scale insulin coverage  Pulmonary nodule 1 cm or greater in diameter, left upper lobe Incidental finding on CT chest.  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 , ,  (Optional):26781}  {(NOTE) Pain control PDMP Statment (Optional):26782} Consultants: *** Procedures performed: ***  Disposition: {Plan; Disposition:26390} Diet recommendation:  Discharge Diet Orders (From admission, onward)     Start     Ordered   06/04/22 0000  Diet - low sodium heart healthy        06/04/22 1336   06/04/22 0000  Diet Carb Modified        06/04/22 1336           {Diet_Plan:26776} DISCHARGE MEDICATION: Allergies as of 06/04/2022       Reactions   Levofloxacin Other (See Comments)   Hallucinations         Medication List     STOP taking these medications    amikacin 1 GM/4ML Soln injection Commonly known as: AMIKIN Replaced by: amikacin  IVPB   baclofen 10 MG tablet Commonly known as: LIORESAL   cefOXitin 2 g in sodium chloride 0.9 % 100 mL   HYDROcodone-acetaminophen 5-325 MG tablet Commonly known as: NORCO/VICODIN   nystatin 100000 UNIT/ML suspension Commonly known as: MYCOSTATIN       TAKE these medications    acetaminophen 500 MG tablet Commonly known as: TYLENOL Take 500-1,000 mg by mouth every 6  (six) hours as needed for moderate pain.   albuterol 108 (90 Base) MCG/ACT inhaler Commonly known as: VENTOLIN HFA Inhale 2 puffs into the lungs every 6 (six) hours as needed for wheezing or shortness of breath.   amikacin  IVPB Commonly known as: AMIKIN Inject 800 mg into the vein as directed for 17 days. Give Amikacin '800mg'$  IV on Tuesdays, Thursdays, and Saturdays Indication: M. Abscessus ankle infection First Dose: Yes Last Day of Therapy: 06/21/2022 Labs - Sunday/Monday:  CBC/D, CMP, CRP, and amikacin trough. Labs - Thursday:  BMP and amikacin trough Method of administration: Elastomeric Method of administration may be changed at the discretion of home infusion pharmacist based upon assessment of the patient and/or caregiver's ability to self-administer the medication ordered. Replaces: amikacin 1 GM/4ML Soln injection   amitriptyline 25 MG tablet Commonly known as: ELAVIL Take 25 mg by mouth at bedtime.   cefOXitin 2 g in dextrose 5 % 50 mL Inject 2 g into the vein every 12 (twelve) hours for 17 days. Indication: M. Abscessus ankle infection First Dose: Yes Last Day of Therapy: 06/21/2022 Labs - Sunday/Monday:  CBC/D, CMP, CRP, and amikacin trough. Labs - Thursday:  BMP and amikacin trough Method  of administration: per home infusion Method of administration may be changed at the discretion of home infusion pharmacist based upon assessment of the patient and/or caregiver's ability to self-administer the medication ordered.   CLOFAZIMINE PO Take 100 mg by mouth daily.   desvenlafaxine 50 MG 24 hr tablet Commonly known as: PRISTIQ Take 50 mg by mouth daily.   furosemide 20 MG tablet Commonly known as: LASIX Take 20 mg by mouth daily.   Lantus SoloStar 100 UNIT/ML Solostar Pen Generic drug: insulin glargine Inject 8 Units into the skin at bedtime.   levothyroxine 100 MCG tablet Commonly known as: SYNTHROID Take 100 mcg by mouth every morning.   linezolid 600 MG  tablet Commonly known as: ZYVOX Take 1 tablet (600 mg total) by mouth daily.   metFORMIN 500 MG tablet Commonly known as: GLUCOPHAGE Take 500 mg by mouth 2 (two) times daily with a meal.   metoCLOPramide 5 MG tablet Commonly known as: REGLAN Take 5 mg by mouth 2 (two) times daily.   multivitamin with minerals Tabs tablet Take 1 tablet by mouth daily.   nitroGLYCERIN 0.4 MG SL tablet Commonly known as: NITROSTAT Place 1 tablet (0.4 mg total) under the tongue every 5 (five) minutes as needed for chest pain.   NovoLOG FlexPen 100 UNIT/ML FlexPen Generic drug: insulin aspart Inject 4 Units into the skin daily.   rosuvastatin 10 MG tablet Commonly known as: CRESTOR Take 10 mg by mouth at bedtime.   telmisartan 40 MG tablet Commonly known as: MICARDIS Take 40 mg by mouth daily.   traZODone 50 MG tablet Commonly known as: DESYREL Take 50 mg by mouth at bedtime.   VITAMIN D3 PO Take 1 capsule by mouth daily.               Discharge Care Instructions  (From admission, onward)           Start     Ordered   06/04/22 0000  Change dressing on IV access line weekly and PRN  (Home infusion instructions - [facility] )        06/04/22 1418            Follow-up Information     Cassandra Crouch, MD Follow up.   Specialty: Internal Medicine Contact information: [place] [address] [phone]                Discharge Exam: Cassandra [name] Weights   06/02/22 1447  Weight: 61.2 kg   ***  Condition at discharge: {DC Condition:26389}  The results of significant diagnostics from this hospitalization (including imaging, microbiology, ancillary and laboratory) are listed below for reference.   Imaging Studies: ECHOCARDIOGRAM COMPLETE  Result Date: 06/03/2022    ECHOCARDIOGRAM REPORT   Patient Name:   Cassandra [name] Date of Exam: 06/03/2022 Medical Rec #:  [id number]          Height:       59.0 in  Accession #:    [id number]         Weight:       134.9 lb Date of Birth:  1932/09/22          BSA:          1.560 m Patient Age:    88 years           BP:           194/59 mmHg Patient Gender: F  HR:           59 bpm. Exam Location:  [facility] Procedure: 2D Echo, Color Doppler and Cardiac Doppler Indications:     TIA G45.9  History:         Patient has prior history of Echocardiogram examinations, most                  recent 02/27/2021. Previous Myocardial Infarction, Pacemaker;                  Risk Factors:Hypertension and Diabetes.  Sonographer:     Sherrie Sport Referring Phys:  [id number] Athena Masse Diagnosing Phys: Isaias Cowman MD  Sonographer Comments: Technically challenging study due to limited acoustic windows, no parasternal window and suboptimal apical window. IMPRESSIONS  1. Left ventricular ejection fraction, by estimation, is 60 to 65%. The left ventricle has normal function. The left ventricle has no regional wall motion abnormalities. Left ventricular diastolic parameters are indeterminate.  2. Right ventricular systolic function is normal. The right ventricular size is normal.  3. The mitral valve is normal in structure. Mild mitral valve regurgitation. No evidence of mitral stenosis.  4. Tricuspid valve regurgitation is moderate to severe.  5. The aortic valve is normal in structure. Aortic valve regurgitation is not visualized. No aortic stenosis is present.  6. The inferior vena cava is normal in size with greater than 50% respiratory variability, suggesting right atrial pressure of 3 mmHg. FINDINGS  Left Ventricle: Left ventricular ejection fraction, by estimation, is 60 to 65%. The left ventricle has normal function. The left ventricle has no regional wall motion abnormalities. The left ventricular internal cavity size was normal in size. There is  no left ventricular hypertrophy. Left ventricular diastolic parameters are indeterminate. Right Ventricle: The right ventricular  size is normal. No increase in right ventricular wall thickness. Right ventricular systolic function is normal. Left Atrium: Left atrial size was normal in size. Right Atrium: Right atrial size was normal in size. Pericardium: There is no evidence of pericardial effusion. Mitral Valve: The mitral valve is normal in structure. Mild mitral valve regurgitation. No evidence of mitral valve stenosis. Tricuspid Valve: The tricuspid valve is normal in structure. Tricuspid valve regurgitation is moderate to severe. No evidence of tricuspid stenosis. Aortic Valve: The aortic valve is normal in structure. Aortic valve regurgitation is not visualized. No aortic stenosis is present. Aortic valve mean gradient measures 2.3 mmHg. Aortic valve peak gradient measures 4.4 mmHg. Aortic valve area, by VTI measures 1.75 cm. Pulmonic Valve: The pulmonic valve was normal in structure. Pulmonic valve regurgitation is not visualized. No evidence of pulmonic stenosis. Aorta: The aortic root is normal in size and structure. Venous: The inferior vena cava is normal in size with greater than 50% respiratory variability, suggesting right atrial pressure of 3 mmHg. IAS/Shunts: No atrial level shunt detected by color flow Doppler. Additional Comments: A device lead is visualized.  LEFT VENTRICLE PLAX 2D LVIDd:         3.70 cm   Diastology LVIDs:         2.50 cm   LV e' medial:    4.13 cm/s LV PW:         1.70 cm   LV E/e' medial:  15.2 LV IVS:        1.40 cm   LV e' lateral:   10.80 cm/s LVOT diam:     2.00 cm   LV E/e' lateral: 5.8 LV SV:  32 LV SV Index:   20 LVOT Area:     3.14 cm  RIGHT VENTRICLE RV Basal diam:  3.40 cm RV Mid diam:    3.70 cm RV S prime:     10.40 cm/s TAPSE (M-mode): 2.1 cm LEFT ATRIUM           Index        RIGHT ATRIUM           Index LA diam:      3.50 cm 2.24 cm/m   RA Area:     10.70 cm LA Vol (A2C): 26.0 ml 16.66 ml/m  RA Volume:   19.80 ml  12.69 ml/m LA Vol (A4C): 74.3 ml 47.62 ml/m  AORTIC VALVE AV  Area (Vmax):    1.78 cm AV Area (Vmean):   1.66 cm AV Area (VTI):     1.75 cm AV Vmax:           104.43 cm/s AV Vmean:          71.733 cm/s AV VTI:            0.181 m AV Peak Grad:      4.4 mmHg AV Mean Grad:      2.3 mmHg LVOT Vmax:         59.10 cm/s LVOT Vmean:        37.900 cm/s LVOT VTI:          0.101 m LVOT/AV VTI ratio: 0.56 MITRAL VALVE               TRICUSPID VALVE MV Area (PHT): 3.02 cm    TR Peak grad:   32.9 mmHg MV Decel Time: 251 msec    TR Vmax:        287.00 cm/s MV E velocity: 62.90 cm/s MV A velocity: 78.60 cm/s  SHUNTS MV E/A ratio:  0.80        Systemic VTI:  0.10 m                            Systemic Diam: 2.00 cm Isaias Cowman MD Electronically signed by Isaias Cowman MD Signature Date/Time: 06/03/2022/5:33:05 PM    Final    EEG adult  Result Date: 06/03/2022 Lora Havens, MD     06/03/2022  4:53 PM Patient Name: REGINNA SERMENO MRN: [id number] Epilepsy Attending: Lora Havens Referring Physician/Provider: Athena Masse, MD Date: 06/03/2022 Duration: 26.10 mins Patient history: 88yo f with ams. EEG to evaluate for seizure Level of alertness: Awake AEDs during EEG study: None Technical aspects: This EEG study was done with scalp electrodes positioned according to the 10-20 International system of electrode placement. Electrical activity was reviewed with band pass filter of 1-'70Hz'$ , sensitivity of 7 uV/mm, display speed of 45m/sec with a '60Hz'$  notched filter applied as appropriate. EEG data were recorded continuously and digitally stored.  Video monitoring was available and reviewed as appropriate. Description: The posterior dominant rhythm consists of 9 Hz activity of moderate voltage (25-35 uV) seen predominantly in posterior head regions, symmetric and reactive to eye opening and eye closing. EEG showed intermittent generalized 3-'5hz'$  theta- delta slowing, at times with triphasic morphology. Hyperventilation and photic stimulation were not performed.   ABNORMALITY  - Intermittent slow, generalized IMPRESSION: This study is suggestive of mild diffuse encephalopathy, nonspecific etiology. No seizures or epileptiform discharges were seen throughout the recording. Priyanka O Yadav   [place] [facility]  Result Date: 06/03/2022 CLINICAL DATA:  TIA Hypertension Hyperlipidemia Diabetes EXAM: BILATERAL CAROTID DUPLEX ULTRASOUND TECHNIQUE: Pearline Cables scale imaging, color Doppler and duplex ultrasound were performed of bilateral carotid and vertebral arteries in the neck. COMPARISON:  10/05/2021 FINDINGS: Criteria: Quantification of carotid stenosis is based on velocity parameters that correlate the residual internal carotid diameter with NASCET-based stenosis levels, using the diameter of the distal internal carotid lumen as the denominator for stenosis measurement. The following velocity measurements were obtained: RIGHT ICA: 129/20 cm/sec CCA: 91/4 cm/sec SYSTOLIC ICA/CCA RATIO:  1.5 ECA: 100 cm/sec LEFT ICA: 7/68 cm/sec CCA: 78/29 cm/sec SYSTOLIC ICA/CCA RATIO:  1.1 ECA: 53 cm/sec RIGHT CAROTID ARTERY: Moderate amount of calcified plaque seen in the distal left common carotid extending into the proximal internal carotid artery. RIGHT VERTEBRAL ARTERY:  Antegrade flow. LEFT CAROTID ARTERY: Bulky calcified plaque seen in the proximal left common carotid artery. LEFT VERTEBRAL ARTERY:  Antegrade flow. IMPRESSION: 1. Bulky calcified plaque seen in the proximal left common carotid artery. Less than 50% stenosis of the left internal carotid artery. 2. Moderate amount of calcified plaque seen in the distal right common carotid artery extending into the proximal right internal carotid artery results in 50-69% stenosis by Doppler criteria. Electronically Signed   By: Miachel Roux M.D.   On: 06/03/2022 11:33   CT HEAD WO CONTRAST (5MM)  Result Date: 06/02/2022 CLINICAL DATA:  Altered mental status EXAM: CT HEAD WITHOUT CONTRAST TECHNIQUE: Contiguous axial images were  obtained from the base of the skull through the vertex without intravenous contrast. RADIATION DOSE REDUCTION: This exam was performed according to the departmental dose-optimization program which includes automated exposure control, adjustment of the mA and/or kV according to patient size and/or use of iterative reconstruction technique. COMPARISON:  02/25/2021 FINDINGS: Brain: No evidence of acute infarction, hemorrhage, hydrocephalus, extra-axial collection or mass lesion/mass effect. Subcortical white matter and periventricular small vessel ischemic changes. Old left cerebellar infarct. Vascular: Intracranial atherosclerosis. Skull: Normal. Negative for fracture or focal lesion. Sinuses/Orbits: The visualized paranasal sinuses are essentially clear. The mastoid air cells are unopacified. Other: None. IMPRESSION: No acute intracranial abnormality. Small vessel ischemic changes. Old left cerebellar infarct. Electronically Signed   By: Julian Hy M.D.   On: 06/02/2022 20:58   CT Chest Wo Contrast  Result Date: 06/02/2022 CLINICAL DATA:  Altered mental status for 1 day, hypoxia, initial encounter EXAM: CT CHEST WITHOUT CONTRAST TECHNIQUE: Multidetector CT imaging of the chest was performed following the standard protocol without IV contrast. RADIATION DOSE REDUCTION: This exam was performed according to the departmental dose-optimization program which includes automated exposure control, adjustment of the mA and/or kV according to patient size and/or use of iterative reconstruction technique. COMPARISON:  Chest x-ray from earlier in the same day, CT from 02/28/2021, mammographies from 04/18/2021. FINDINGS: Cardiovascular: Evaluation is limited due to lack of IV contrast. Diffuse atherosclerotic calcifications of the aorta are noted. No aneurysmal dilatation is seen. Heart is at the upper limits of normal in size. Pacing device is noted. Heavy coronary calcifications are seen. No pericardial effusion is  noted. Mediastinum/Nodes: Thoracic inlet is within normal limits. Scattered small mediastinal lymph nodes are noted relatively stable from the prior exam and likely reactive in nature. The esophagus as visualized is within normal limits. Lungs/Pleura: Lungs are well aerated bilaterally. Diffuse emphysematous changes are again identified and stable. Some increased nodularity is noted in the anterior aspect of the left upper lobe best seen on image number 29 of series 3 measuring  12 x 12 x 7 mm. This is new from the prior CT examination from June of 2022. No other significant nodular abnormalities are seen. Upper Abdomen: Visualized upper abdomen shows evidence of prior cholecystectomy. Musculoskeletal: No acute rib abnormality is noted. Bilateral breast implants are seen. Considerable soft tissue density is noted anterior to the right breast implant similar to that seen on prior CT and noted on prior mammogram to be related to extracapsular silicone from prior right breast implant rupture. The overall appearance is stable from the prior exam. No significant lymphadenopathy is noted. Degenerative changes of the thoracic spine are noted. Changes of prior vertebral augmentation are seen at T11. T5 compression fracture is noted of uncertain chronicity IMPRESSION: New 10 mm left solid pulmonary nodule within the upper lobe. Per Fleischner Society Guidelines, consider a non-contrast Chest CT at 3 months, a PET/CT, or tissue sampling. These guidelines do not apply to immunocompromised patients and patients with cancer. Follow up in patients with significant comorbidities as clinically warranted. For lung cancer screening, adhere to Lung-RADS guidelines. Reference: Radiology. 2017; 284(1):228-43. T5 compression deformity of uncertain chronicity. Correlate to point tenderness. It is new from a prior exam of June of 2022. Changes consistent with prior right breast implant rupture with extracapsular silicone anteriorly. This is  stable from the prior exam as well as a prior mammogram. Aortic Atherosclerosis (ICD10-I70.0) and Emphysema (ICD10-J43.9). Electronically Signed   By: Inez Catalina M.D.   On: 06/02/2022 19:23   DG Chest Port 1 View  Result Date: 06/02/2022 CLINICAL DATA:  Short of breath. Patient also altered mental status and hallucinations. EXAM: PORTABLE CHEST 1 VIEW COMPARISON:  02/26/2021 and older exams.  CT, 02/28/2021. FINDINGS: Cardiac silhouette mildly enlarged.  No mediastinal or hilar masses. Bilateral irregularly thickened interstitial markings. Emphysematous changes in the upper lobes. No lung consolidation. No convincing pleural effusion and no pneumothorax. Left anterior chest wall dual lead pacemaker is stable. Right internal jugular small caliber dual lumen central venous catheter, tip in the mid superior vena cava, new since the prior exam. Skeletal structures are grossly intact. IMPRESSION: 1. No acute cardiopulmonary disease. 2. Chronic interstitial thickening.  Upper lobe emphysema. Electronically Signed   By: Lajean Manes M.D.   On: 06/02/2022 15:38    Microbiology: Results for orders placed or performed during the hospital encounter of 06/02/22  Resp Panel by RT-PCR (Flu A&B, Covid) Anterior Nasal Swab     Status: None   Collection Time: 06/02/22  2:42 PM   Specimen: Anterior Nasal Swab  Result Value Ref Range Status   SARS Coronavirus 2 by RT PCR NEGATIVE NEGATIVE Final    Comment: (NOTE) SARS-CoV-2 target nucleic acids are NOT DETECTED.  The SARS-CoV-2 RNA is generally detectable in upper respiratory specimens during the acute phase of infection. The lowest concentration of SARS-CoV-2 viral copies this assay can detect is 138 copies/mL. A negative result does not preclude SARS-Cov-2 infection and should not be used as the sole basis for treatment or other patient management decisions. A negative result may occur with  improper specimen collection/handling, submission of specimen  other than nasopharyngeal swab, presence of viral mutation(s) within the areas targeted by this assay, and inadequate number of viral copies(<138 copies/mL). A negative result must be combined with clinical observations, patient history, and epidemiological information. The expected result is Negative.  Fact Sheet for Patients:  EntrepreneurPulse.com.au  Fact Sheet for Healthcare Providers:  IncredibleEmployment.be  This test is no t yet approved or cleared by the [place]  States FDA and  has been authorized for detection and/or diagnosis of SARS-CoV-2 by FDA under an Emergency Use Authorization (EUA). This EUA will remain  in effect (meaning this test can be used) for the duration of the COVID-19 declaration under Section 564(b)(1) of the Act, 21 U.S.C.section 360bbb-3(b)(1), unless the authorization is terminated  or revoked sooner.       Influenza A by PCR NEGATIVE NEGATIVE Final   Influenza B by PCR NEGATIVE NEGATIVE Final    Comment: (NOTE) The Xpert Xpress SARS-CoV-2/FLU/RSV plus assay is intended as an aid in the diagnosis of influenza from Nasopharyngeal swab specimens and should not be used as a sole basis for treatment. Nasal washings and aspirates are unacceptable for Xpert Xpress SARS-CoV-2/FLU/RSV testing.  Fact Sheet for Patients: EntrepreneurPulse.com.au  Fact Sheet for Healthcare Providers: IncredibleEmployment.be  This test is not yet approved or cleared by the [place] FDA and has been authorized for detection and/or diagnosis of SARS-CoV-2 by FDA under an Emergency Use Authorization (EUA). This EUA will remain in effect (meaning this test can be used) for the duration of the COVID-19 declaration under Section 564(b)(1) of the Act, 21 U.S.C. section 360bbb-3(b)(1), unless the authorization is terminated or revoked.  Performed at [facility], [address]  [place], [place] [address]     Labs: CBC: Recent Labs  Lab 06/02/22 1442  WBC 11.0*  NEUTROABS 6.9  HGB 10.2*  HCT 33.4*  MCV 81.7  PLT 836   Basic Metabolic Panel: Recent Labs  Lab 06/02/22 1442 06/03/22 0615 06/04/22 0433  NA 141  --   --   K 4.8  --   --   CL 110  --   --   CO2 22  --   --   GLUCOSE 117*  --   --   BUN 22  --   --   CREATININE 1.86* 1.66* 1.70*  CALCIUM 9.1  --   --    Liver Function Tests: Recent Labs  Lab 06/02/22 1442  AST 29  ALT 13  ALKPHOS 86  BILITOT 1.1  PROT 7.6  ALBUMIN 2.9*   CBG: Recent Labs  Lab 06/03/22 1650 06/03/22 2302 06/04/22 0031 06/04/22 0848 06/04/22 1150  GLUCAP 145* 101* 111* 125* 153*    Discharge time spent: {LESS THAN/GREATER THAN:26388} 30 minutes.  Signed: Jennye Boroughs, MD [facility] 06/04/2022

## 2022-06-04 NOTE — Progress Notes (Signed)
OT Cancellation Note  Patient Details Name: Cassandra [name] MRN: [id number] DOB: June 04, 1933   Cancelled Treatment:    Reason Eval/Treat Not Completed: Patient declined, no reason specified. Consult received, chart reviewed. On attempt, pt alert and oriented, family present. Pt declines OT evaluation and family in agreement. Pt/family with no concerns about discharging today, per pt report. Will sign off. Please re-consult if additional needs arise.   Ardeth Perfect., MPH, MS, OTR/L ascom [phone] 06/04/22, 11:43 AM

## 2022-06-04 NOTE — Plan of Care (Signed)
  Problem: Activity: Goal: Risk for activity intolerance will decrease Outcome: Progressing   Problem: Coping: Goal: Level of anxiety will decrease Outcome: Progressing   Problem: Pain Managment: Goal: General experience of comfort will improve Outcome: Progressing   Problem: Safety: Goal: Ability to remain free from injury will improve Outcome: Progressing   

## 2022-06-04 NOTE — Progress Notes (Signed)
SLP Cancellation Note  Patient Details Name: Cassandra [name] MRN: [id number] DOB: 1932/10/14   Cancelled treatment:       Reason Eval/Treat Not Completed: SLP screened, no needs identified, will sign off (chart reviewed; consulted NSG and met w/ pt in room. Husband arrived.) Pt denied any difficulty swallowing and is currently on a regular diet; tolerates swallowing pills w/ water per NSG. She stated she ate "well" at breakfast meal; noted the empty plate.   Pt conversed in general conversation w/out expressive/receptive deficits noted; pt denied any speech-language deficits. Speech clear. She described ongoing medical issues w/ her ankle and was well-aware of her hallucinations prompting admit to the ED -- suspect it could have been Medication related(this medication has been stopped per chart notes).  No further skilled ST services indicated as pt appears at her baseline. Pt agreed. NSG to reconsult if any change in status while admitted.       Orinda Kenner, MS, CCC-SLP Speech Language Pathologist Rehab Services; [place] [phone] (ascom) Roneisha Stern 06/04/2022, 12:33 PM

## 2022-06-04 NOTE — Progress Notes (Signed)
SATURATION QUALIFICATIONS: (This note is used to comply with regulatory documentation for home oxygen)  Patient Saturations on Room Air at Rest = 98%  Patient Saturations on Room Air while Ambulating = 95%   

## 2022-06-04 NOTE — TOC Transition Note (Signed)
Transition of Care [facility]) - CM/SW Discharge Note   Patient Details  Name: Cassandra [name] MRN: [id number] Date of Birth: February 04, 1933  [facility] of Care [facility]) CM/SW Contact:  Alberteen Sam, LCSW Phone Number: 06/04/2022, 2:13 PM   Clinical Narrative:      CSW spoke with patient and family regarding dc today, agreeable to [facility] PT no preference of agency. CSW spoke with Meg with [facility] they are able to accept. Patient is already active with [facility] per Carolynn Sayers and with Dimitri Ped for RN.   No further dc needs.   She reports  Final next level of care: Home w Home Health Services Barriers to Discharge: No Barriers Identified   Patient Goals and CMS Choice Patient states their goals for this hospitalization and ongoing recovery are:: to go home CMS Medicare.gov Compare Post Acute Care list provided to:: Patient Choice offered to / list presented to : Patient  Discharge Placement                       Discharge Plan and Services                          HH Arranged: PT [facility] Agency: [place] Date [place]: 06/04/22 Time [place]: [id number] Representative spoke with at [place]: [place] ([place]) Interventions     Readmission Risk Interventions    03/16/2021    2:34 PM  Readmission Risk Prevention Plan  Transportation Screening Complete  PCP or Specialist Appt within 5-7 Days Complete  Home Care Screening Complete  Medication Review (RN CM) Complete

## 2022-06-04 NOTE — Progress Notes (Signed)
PHARMACY CONSULT NOTE FOR:  OUTPATIENT  PARENTERAL ANTIBIOTIC THERAPY (OPAT)  Indication: M abscessus ankle infection Regimen:  Amikacin '800mg'$  IV on Tue, Thur, Sat Cefoxitin 2gm IV q12h End date: 06/21/2022 (or as per ID follow-up)  Also receiving Linezolid '600mg'$  PO daily CLofazimine '100mg'$  PO daily    IV antibiotic discharge orders are pended. To discharging provider:  please sign these orders via discharge navigator,  Select New Orders & click on the button choice - Manage This Unsigned Work.     Thank you for allowing pharmacy to be a part of this patient's care.  Doreene Eland, PharmD, BCPS, BCIDP Work Cell: [phone] 06/04/2022 2:02 PM

## 2022-06-05 DIAGNOSIS — G929 Unspecified toxic encephalopathy: Secondary | ICD-10-CM | POA: Diagnosis present

## 2022-06-05 LAB — AMIKACIN, TROUGH: Amikacin Tr: 2.3 ug/mL (ref 1.0–8.0)

## 2022-06-12 ENCOUNTER — Other Ambulatory Visit: Payer: Self-pay

## 2022-06-12 ENCOUNTER — Encounter: Payer: Self-pay | Admitting: Internal Medicine

## 2022-06-12 ENCOUNTER — Ambulatory Visit (INDEPENDENT_AMBULATORY_CARE_PROVIDER_SITE_OTHER): Payer: Medicare Other | Admitting: Internal Medicine

## 2022-06-12 VITALS — BP 148/77 | HR 66 | Temp 98.1°F | Ht 59.0 in | Wt 135.0 lb

## 2022-06-12 DIAGNOSIS — M869 Osteomyelitis, unspecified: Secondary | ICD-10-CM

## 2022-06-12 NOTE — Patient Instructions (Signed)
You should be on 4 medications  Iv's =  1) amikacin drip 2) cefoxitin ball  Oral's = 3) clofazamine (red pill) 100 mg or 2capsules once a day 4) zyvox (600 mg once a day)   My plan for all of these medications until end of 08/2022   Will plan to get a ct scan of your leg in around 6 weeks  Please me again after the ct scan
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
## 2022-06-13 ENCOUNTER — Telehealth: Payer: Self-pay

## 2022-06-13 NOTE — Telephone Encounter (Signed)
Received voicemail from [place] at [facility], she reports patient's husband was there earlier today trying to pick up medication, but they were not sure what he needed.   Advised them patient should be on linezolid, this cannot be dispensed until tomorrow since it was last filled on 9/20. Notified patient's husband of this.   Beryle Flock, RN

## 2022-06-20 LAB — ACID FAST CULTURE WITH REFLEXED SENSITIVITIES (MYCOBACTERIA): Acid Fast Culture: NEGATIVE

## 2022-06-27 ENCOUNTER — Ambulatory Visit (INDEPENDENT_AMBULATORY_CARE_PROVIDER_SITE_OTHER): Payer: Medicare Other | Admitting: Gastroenterology

## 2022-06-27 ENCOUNTER — Other Ambulatory Visit: Payer: Self-pay

## 2022-06-27 DIAGNOSIS — R1312 Dysphagia, oropharyngeal phase: Secondary | ICD-10-CM

## 2022-06-27 DIAGNOSIS — R933 Abnormal findings on diagnostic imaging of other parts of digestive tract: Secondary | ICD-10-CM

## 2022-06-27 NOTE — Progress Notes (Signed)
Jonathon Bellows MD, MRCP(U.K) [address]  [place]  [place], [place] [address]  Main: [phone]  Fax: [phone]   Gastroenterology Consultation  Referring Provider:     Idelle Crouch, MD Primary Care Physician:  Idelle Crouch, MD Primary Gastroenterologist:  Dr. Jonathon Bellows  Reason for Consultation: Dysphagia        HPI:   Cassandra [name] is a 88 y.o. y/o female referred for consultation & management  by Dr. [name], Leonie Douglas, MD.     Patient has been referred to see a for dysphagia already seen and evaluated by [facility] clinic gastroenterology in March 20 21-20 23 for the same.  Issues with dysphagia ongoing since 2019.  Recently seen at [facility] GI office in March 2023 March 2021 barium showed mild prominence of cricopharyngeus fold.  Plan at that time was for barium swallow.  11/14/2021 barium swallow showed study positive for aspiration nonspecific esophageal dysmotility noted small hiatal hernia patient refused barium tablet was recommended modified barium swallow.   Today she states that she would like to transfer care to our office.  She states that she has difficulty swallowing small pills to get lost when she tries to swallow but takes it with applesauce denies any coughing denies any actual pills that get lodged into her esophagus when she swallows.  She has to be on oxygen but is still waiting for her oxygen.  Denies any weight loss.  No other complaints presently.  Past Medical History:  Diagnosis Date   Acquired hypothyroidism 01/06/2014   Anxiety    Arthritis    B12 deficiency 02/15/2014   Benign essential hypertension 01/06/2014   Breast cancer ([place]) 1982   Right breast cancer - chemotherapy   CAD (coronary artery disease), native coronary artery 01/06/2014   Carotid artery calcification    Chronic airway obstruction, not elsewhere classified 01/06/2014   Chronic mesenteric ischemia (HCC)    s/p SMA stent 11/03/19   Depression    Diabetes mellitus  without complication ([place])    Dysrhythmia    aflutter with RVR 06/2016   History of kidney stones 2019   left ureteral stone   Incontinence of urine    Myocardial infarction ([place]) 1982   small infarct   Neuromuscular disorder (HCC)    restless legs   Pacemaker    Presence of permanent cardiac pacemaker 07/2016   Pure hypercholesterolemia 01/06/2014   Renal insufficiency    Skin cancer    Sleep apnea    no longer uses a cpap. Uses oxygen as needed    Past Surgical History:  Procedure Laterality Date   ANKLE FUSION Left 10/10/2021   Procedure: ANKLE FUSION;  Surgeon: Shona Needles, MD;  Location: [place];  Service: Orthopedics;  Laterality: Left;   APPENDECTOMY     APPLICATION OF WOUND VAC Left 02/24/2021   Procedure: APPLICATION OF WOUND VAC;  Surgeon: Rod Can, MD;  Location: [place];  Service: Orthopedics;  Laterality: Left;   AUGMENTATION MAMMAPLASTY Bilateral 1982/redo in 2014   prior mastectomy   CARDIAC CATHETERIZATION N/A 04/23/2016   Procedure: Left Heart Cath and Coronary Angiography;  Surgeon: Corey Skains, MD;  Location: [place] [facility];  Service: Cardiovascular;  Laterality: N/A;   CARDIAC CATHETERIZATION Left 04/23/2016   Procedure: Coronary Stent Intervention;  Surgeon: Yolonda Kida, MD;  Location: [place] [facility];  Service: Cardiovascular;  Laterality: Left;   CAROTID ARTERY ANGIOPLASTY Left 2010   had stent inserted  and removed d/t infection. artery from leg inserted in left carotid   CAROTID ENDARTERECTOMY Left 2010   left CEA ~ 2010, s/p excision infected left carotid patch & pseudoaneurysm with left CCA-ICA bypass using SVG 05/20/12   CHOLECYSTECTOMY     CORONARY ANGIOPLASTY WITH STENT PLACEMENT  september 9th 2017   CYSTOSCOPY W/ URETERAL STENT PLACEMENT Left 08/17/2017   Procedure: CYSTOSCOPY WITH RETROGRADE PYELOGRAM/URETERAL STENT PLACEMENT;  Surgeon: Festus Aloe, MD;  Location: [facility];  Service: Urology;  Laterality: Left;    CYSTOSCOPY W/ URETERAL STENT PLACEMENT Left 09/16/2017   Procedure: CYSTOSCOPY WITH STENT REPLACEMENT;  Surgeon: Abbie Sons, MD;  Location: [facility];  Service: Urology;  Laterality: Left;   CYSTOSCOPY/RETROGRADE/URETEROSCOPY/STONE EXTRACTION WITH BASKET Left 09/16/2017   Procedure: CYSTOSCOPY/RETROGRADE/URETEROSCOPY/STONE EXTRACTION WITH BASKET;  Surgeon: Abbie Sons, MD;  Location: [facility];  Service: Urology;  Laterality: Left;   EXTERNAL FIXATION LEG Left 02/24/2021   Procedure: EXTERNAL FIXATION  ANKLE;  Surgeon: Rod Can, MD;  Location: [place];  Service: Orthopedics;  Laterality: Left;   EXTERNAL FIXATION REMOVAL Left 02/26/2021   Procedure: REMOVAL EXTERNAL FIXATION LEG;  Surgeon: Shona Needles, MD;  Location: [place];  Service: Orthopedics;  Laterality: Left;   EYE SURGERY Bilateral    cataract extraction   I & D EXTREMITY Left 02/24/2021   Procedure: IRRIGATION AND DEBRIDEMENT EXTREMITY;  Surgeon: Rod Can, MD;  Location: [place];  Service: Orthopedics;  Laterality: Left;   I & D EXTREMITY Left 12/21/2021   Procedure: IRRIGATION AND DEBRIDEMENT LEFT ANKLE;  Surgeon: Shona Needles, MD;  Location: [place];  Service: Orthopedics;  Laterality: Left;   I & D EXTREMITY Left 05/03/2022   Procedure: DEBRIDEMENT OF LEFT ANKLE INFECTION;  Surgeon: Shona Needles, MD;  Location: [facility];  Service: Orthopedics;  Laterality: Left;   IR FLUORO GUIDE CV LINE LEFT  12/27/2021   IR FLUORO GUIDE CV LINE RIGHT  10/12/2021   IR REMOVAL TUN CV CATH W/O FL  11/22/2021   IR US GUIDE VASC ACCESS LEFT  12/27/2021   IR US GUIDE VASC ACCESS RIGHT  10/12/2021   KYPHOPLASTY N/A 02/03/2018   Procedure: [id number];  Surgeon: Hessie Knows, MD;  Location: [facility];  Service: Orthopedics;  Laterality: N/A;   MASTECTOMY Right 1982   ORIF ANKLE FRACTURE Left 02/26/2021   Procedure: OPEN REDUCTION INTERNAL FIXATION (ORIF) ANKLE FRACTURE;  Surgeon: Shona Needles, MD;  Location: [facility];  Service:  Orthopedics;  Laterality: Left;   PACEMAKER INSERTION Left 07/03/2016   Procedure: INSERTION PACEMAKER;  Surgeon: Isaias Cowman, MD;  Location: [facility];  Service: Cardiovascular;  Laterality: Left;   SKIN CANCER EXCISION     TONSILLECTOMY     TUBAL LIGATION     VISCERAL ANGIOGRAPHY N/A 11/03/2019   Procedure: VISCERAL ANGIOGRAPHY;  Surgeon: Katha Cabal, MD;  Location: [facility] [facility];  Service: Cardiovascular;  Laterality: N/A;    Prior to Admission medications   Medication Sig Start Date End Date Taking? Authorizing Provider  acetaminophen (TYLENOL) 500 MG tablet Take 500-1,000 mg by mouth every 6 (six) hours as needed for moderate pain.    [provider]  albuterol (VENTOLIN HFA) 108 (90 Base) MCG/ACT inhaler Inhale 2 puffs into the lungs every 6 (six) hours as needed for wheezing or shortness of breath. Patient not taking: Reported on 06/12/2022 03/22/21   Cathlyn Parsons, PA-C  amikacin (AMIKIN) 1 GM/4ML SOLN injection  06/23/22   [provider]  amitriptyline (ELAVIL) 25 MG tablet Take 25 mg by mouth at bedtime. Patient not taking: Reported on 06/12/2022    [provider]  cefOXitin (MEFOXIN) 2 g injection Inject into the vein. 06/23/22   [provider]  Cholecalciferol (VITAMIN D3 PO) Take 1 capsule by mouth daily.    [provider]  CLOFAZIMINE PO Take 100 mg by mouth daily.    [provider]  desvenlafaxine (PRISTIQ) 50 MG 24 hr tablet Take 50 mg by mouth daily. 01/22/22   [provider]  furosemide (LASIX) 20 MG tablet Take 20 mg by mouth daily. Patient not taking: Reported on 06/12/2022 01/22/22   [provider]  insulin aspart (NOVOLOG FLEXPEN) 100 UNIT/ML FlexPen Inject 4 Units into the skin daily.    [provider]  insulin glargine (LANTUS SOLOSTAR) 100 UNIT/ML Solostar Pen Inject 8 Units into the skin at bedtime. 06/04/22   Jennye Boroughs, MD  levothyroxine (SYNTHROID)  100 MCG tablet Take 100 mcg by mouth every morning. 01/22/22   [provider]  linezolid (ZYVOX) 600 MG tablet Take 1 tablet (600 mg total) by mouth daily. Patient not taking: Reported on 06/12/2022 04/24/22   Kuppelweiser, Cassie L, RPH-CPP  metFORMIN (GLUCOPHAGE) 500 MG tablet Take 500 mg by mouth 2 (two) times daily with a meal.    [provider]  metoCLOPramide (REGLAN) 5 MG tablet Take 5 mg by mouth 2 (two) times daily. Patient not taking: Reported on 06/12/2022 04/23/22   [provider]  Multiple Vitamin (MULTIVITAMIN WITH MINERALS) TABS tablet Take 1 tablet by mouth daily. Patient not taking: Reported on 06/12/2022 09/24/17   Max Sane, MD  nitroGLYCERIN (NITROSTAT) 0.4 MG SL tablet Place 1 tablet (0.4 mg total) under the tongue every 5 (five) minutes as needed for chest pain. 04/24/16   Bettey Costa, MD  rosuvastatin (CRESTOR) 10 MG tablet Take 10 mg by mouth at bedtime. 05/21/22   [provider]  telmisartan (MICARDIS) 40 MG tablet Take 40 mg by mouth daily. 05/27/22   [provider]  traZODone (DESYREL) 50 MG tablet Take 50 mg by mouth at bedtime.    [provider]    Family History  Problem Relation Age of Onset   Prostate cancer Neg Hx    Kidney cancer Neg Hx    Breast cancer Neg Hx      Social History   Tobacco Use   Smoking status: Former    Packs/day: 0.50    Types: Cigarettes    Start date: 83    Quit date: 1956    Years since quitting: 67.8   Smokeless tobacco: Never  Vaping Use   Vaping Use: Never used  Substance Use Topics   Alcohol use: Yes    Comment: occasional wine   Drug use: Not Currently    Allergies as of 06/27/2022 - Review Complete 06/12/2022  Allergen Reaction Noted   Baclofen Other (See Comments) 06/07/2022   Levofloxacin Other (See Comments) 07/10/2021    Review of Systems:    All systems reviewed and negative except where noted in HPI.   Physical Exam:  LMP  (LMP Unknown) Comment:  age 76 No LMP recorded (lmp unknown). Patient is postmenopausal. Psych:  Alert and cooperative. Normal mood and affect. General:   Alert,  Well-developed, well-nourished, pleasant and cooperative in NAD Head:  Normocephalic and atraumatic. Eyes:  Sclera clear, no icterus.   Conjunctiva pink. Ears:  Normal auditory acuity. Neurologic:  Alert and oriented x3;  grossly normal neurologically. Psych:  Alert and cooperative. Normal mood and affect.  Imaging Studies: ECHOCARDIOGRAM COMPLETE  Result Date: 06/03/2022    ECHOCARDIOGRAM REPORT   Patient Name:   CAYTLYN EVERS Date of Exam: 06/03/2022 Medical Rec #:  [id number]          Height:       59.0 in Accession #:    [id number]         Weight:       134.9 lb Date of Birth:  1933-05-07          BSA:          1.560 m Patient Age:    27 years           BP:           194/59 mmHg Patient Gender: F                  HR:           59 bpm. Exam Location:  [facility] Procedure: 2D Echo, Color Doppler and Cardiac Doppler Indications:     TIA G45.9  History:         Patient has prior history of Echocardiogram examinations, most                  recent 02/27/2021. Previous Myocardial Infarction, Pacemaker;                  Risk Factors:Hypertension and Diabetes.  Sonographer:     Sherrie Sport Referring Phys:  [id number] Athena Masse Diagnosing Phys: Isaias Cowman MD  Sonographer Comments: Technically challenging study due to limited acoustic windows, no parasternal window and suboptimal apical window. IMPRESSIONS  1. Left ventricular ejection fraction, by estimation, is 60 to 65%. The left ventricle has normal function. The left ventricle has no regional wall motion abnormalities. Left ventricular diastolic parameters are indeterminate.  2. Right ventricular systolic function is normal. The right ventricular size is normal.  3. The mitral valve is normal in structure. Mild mitral valve regurgitation. No evidence of mitral stenosis.  4. Tricuspid valve regurgitation is  moderate to severe.  5. The aortic valve is normal in structure. Aortic valve regurgitation is not visualized. No aortic stenosis is present.  6. The inferior vena cava is normal in size with greater than 50% respiratory variability, suggesting right atrial pressure of 3 mmHg. FINDINGS  Left Ventricle: Left ventricular ejection fraction, by estimation, is 60 to 65%. The left ventricle has normal function. The left ventricle has no regional wall motion abnormalities. The left ventricular internal cavity size was normal in size. There is  no left ventricular hypertrophy. Left ventricular diastolic parameters are indeterminate. Right Ventricle: The right ventricular size is normal. No increase in right ventricular wall thickness. Right ventricular systolic function is normal. Left Atrium: Left atrial size was normal in size. Right Atrium: Right atrial size was normal in size. Pericardium: There is no evidence of pericardial effusion. Mitral Valve: The mitral valve is normal in structure. Mild mitral valve regurgitation. No evidence of mitral valve stenosis. Tricuspid Valve: The tricuspid valve is normal in structure. Tricuspid valve regurgitation is moderate to severe. No evidence of tricuspid stenosis. Aortic Valve: The aortic valve is normal in structure. Aortic valve regurgitation is not visualized. No aortic stenosis is present. Aortic valve mean gradient measures 2.3 mmHg. Aortic valve peak gradient measures 4.4 mmHg. Aortic valve area, by VTI measures 1.75 cm. Pulmonic Valve: The pulmonic valve was normal  in structure. Pulmonic valve regurgitation is not visualized. No evidence of pulmonic stenosis. Aorta: The aortic root is normal in size and structure. Venous: The inferior vena cava is normal in size with greater than 50% respiratory variability, suggesting right atrial pressure of 3 mmHg. IAS/Shunts: No atrial level shunt detected by color flow Doppler. Additional Comments: A device lead is visualized.  LEFT  VENTRICLE PLAX 2D LVIDd:         3.70 cm   Diastology LVIDs:         2.50 cm   LV e' medial:    4.13 cm/s LV PW:         1.70 cm   LV E/e' medial:  15.2 LV IVS:        1.40 cm   LV e' lateral:   10.80 cm/s LVOT diam:     2.00 cm   LV E/e' lateral: 5.8 LV SV:         32 LV SV Index:   20 LVOT Area:     3.14 cm  RIGHT VENTRICLE RV Basal diam:  3.40 cm RV Mid diam:    3.70 cm RV S prime:     10.40 cm/s TAPSE (M-mode): 2.1 cm LEFT ATRIUM           Index        RIGHT ATRIUM           Index LA diam:      3.50 cm 2.24 cm/m   RA Area:     10.70 cm LA Vol (A2C): 26.0 ml 16.66 ml/m  RA Volume:   19.80 ml  12.69 ml/m LA Vol (A4C): 74.3 ml 47.62 ml/m  AORTIC VALVE AV Area (Vmax):    1.78 cm AV Area (Vmean):   1.66 cm AV Area (VTI):     1.75 cm AV Vmax:           104.43 cm/s AV Vmean:          71.733 cm/s AV VTI:            0.181 m AV Peak Grad:      4.4 mmHg AV Mean Grad:      2.3 mmHg LVOT Vmax:         59.10 cm/s LVOT Vmean:        37.900 cm/s LVOT VTI:          0.101 m LVOT/AV VTI ratio: 0.56 MITRAL VALVE               TRICUSPID VALVE MV Area (PHT): 3.02 cm    TR Peak grad:   32.9 mmHg MV Decel Time: 251 msec    TR Vmax:        287.00 cm/s MV E velocity: 62.90 cm/s MV A velocity: 78.60 cm/s  SHUNTS MV E/A ratio:  0.80        Systemic VTI:  0.10 m                            Systemic Diam: 2.00 cm Isaias Cowman MD Electronically signed by Isaias Cowman MD Signature Date/Time: 06/03/2022/5:33:05 PM    Final    EEG adult  Result Date: 06/03/2022 Lora Havens, MD     06/03/2022  4:53 PM Patient Name: SHEVA MCDOUGLE MRN: [id number] Epilepsy Attending: Lora Havens Referring Physician/Provider: Athena Masse, MD Date: 06/03/2022 Duration: 26.10 mins Patient history: 88yo f with ams. EEG to evaluate  for seizure Level of alertness: Awake AEDs during EEG study: None Technical aspects: This EEG study was done with scalp electrodes positioned according to the 10-20 International system of electrode  placement. Electrical activity was reviewed with band pass filter of 1-'70Hz'$ , sensitivity of 7 uV/mm, display speed of 12m/sec with a '60Hz'$  notched filter applied as appropriate. EEG data were recorded continuously and digitally stored.  Video monitoring was available and reviewed as appropriate. Description: The posterior dominant rhythm consists of 9 Hz activity of moderate voltage (25-35 uV) seen predominantly in posterior head regions, symmetric and reactive to eye opening and eye closing. EEG showed intermittent generalized 3-'5hz'$  theta- delta slowing, at times with triphasic morphology. Hyperventilation and photic stimulation were not performed.   ABNORMALITY - Intermittent slow, generalized IMPRESSION: This study is suggestive of mild diffuse encephalopathy, nonspecific etiology. No seizures or epileptiform discharges were seen throughout the recording. Priyanka O Yadav   [place]  Result Date: 06/03/2022 CLINICAL DATA:  TIA Hypertension Hyperlipidemia Diabetes EXAM: BILATERAL CAROTID DUPLEX ULTRASOUND TECHNIQUE: GPearline Cablesscale imaging, color Doppler and duplex ultrasound were performed of bilateral carotid and vertebral arteries in the neck. COMPARISON:  10/05/2021 FINDINGS: Criteria: Quantification of carotid stenosis is based on velocity parameters that correlate the residual internal carotid diameter with NASCET-based stenosis levels, using the diameter of the distal internal carotid lumen as the denominator for stenosis measurement. The following velocity measurements were obtained: RIGHT ICA: 129/20 cm/sec CCA: 888/5cm/sec SYSTOLIC ICA/CCA RATIO:  1.5 ECA: 100 cm/sec LEFT ICA: 7/68 cm/sec CCA: 602/77cm/sec SYSTOLIC ICA/CCA RATIO:  1.1 ECA: 53 cm/sec RIGHT CAROTID ARTERY: Moderate amount of calcified plaque seen in the distal left common carotid extending into the proximal internal carotid artery. RIGHT VERTEBRAL ARTERY:  Antegrade flow. LEFT CAROTID ARTERY: Bulky  calcified plaque seen in the proximal left common carotid artery. LEFT VERTEBRAL ARTERY:  Antegrade flow. IMPRESSION: 1. Bulky calcified plaque seen in the proximal left common carotid artery. Less than 50% stenosis of the left internal carotid artery. 2. Moderate amount of calcified plaque seen in the distal right common carotid artery extending into the proximal right internal carotid artery results in 50-69% stenosis by Doppler criteria. Electronically Signed   By: FMiachel RouxM.D.   On: 06/03/2022 11:33   CT HEAD WO CONTRAST (5MM)  Result Date: 06/02/2022 CLINICAL DATA:  Altered mental status EXAM: CT HEAD WITHOUT CONTRAST TECHNIQUE: Contiguous axial images were obtained from the base of the skull through the vertex without intravenous contrast. RADIATION DOSE REDUCTION: This exam was performed according to the departmental dose-optimization program which includes automated exposure control, adjustment of the mA and/or kV according to patient size and/or use of iterative reconstruction technique. COMPARISON:  02/25/2021 FINDINGS: Brain: No evidence of acute infarction, hemorrhage, hydrocephalus, extra-axial collection or mass lesion/mass effect. Subcortical white matter and periventricular small vessel ischemic changes. Old left cerebellar infarct. Vascular: Intracranial atherosclerosis. Skull: Normal. Negative for fracture or focal lesion. Sinuses/Orbits: The visualized paranasal sinuses are essentially clear. The mastoid air cells are unopacified. Other: None. IMPRESSION: No acute intracranial abnormality. Small vessel ischemic changes. Old left cerebellar infarct. Electronically Signed   By: SJulian HyM.D.   On: 06/02/2022 20:58   CT Chest Wo Contrast  Result Date: 06/02/2022 CLINICAL DATA:  Altered mental status for 1 day, hypoxia, initial encounter EXAM: CT CHEST WITHOUT CONTRAST TECHNIQUE: Multidetector CT imaging of the chest was performed following the standard protocol without IV  contrast. RADIATION DOSE REDUCTION: This  exam was performed according to the departmental dose-optimization program which includes automated exposure control, adjustment of the mA and/or kV according to patient size and/or use of iterative reconstruction technique. COMPARISON:  Chest x-ray from earlier in the same day, CT from 02/28/2021, mammographies from 04/18/2021. FINDINGS: Cardiovascular: Evaluation is limited due to lack of IV contrast. Diffuse atherosclerotic calcifications of the aorta are noted. No aneurysmal dilatation is seen. Heart is at the upper limits of normal in size. Pacing device is noted. Heavy coronary calcifications are seen. No pericardial effusion is noted. Mediastinum/Nodes: Thoracic inlet is within normal limits. Scattered small mediastinal lymph nodes are noted relatively stable from the prior exam and likely reactive in nature. The esophagus as visualized is within normal limits. Lungs/Pleura: Lungs are well aerated bilaterally. Diffuse emphysematous changes are again identified and stable. Some increased nodularity is noted in the anterior aspect of the left upper lobe best seen on image number 29 of series 3 measuring 12 x 12 x 7 mm. This is new from the prior CT examination from June of 2022. No other significant nodular abnormalities are seen. Upper Abdomen: Visualized upper abdomen shows evidence of prior cholecystectomy. Musculoskeletal: No acute rib abnormality is noted. Bilateral breast implants are seen. Considerable soft tissue density is noted anterior to the right breast implant similar to that seen on prior CT and noted on prior mammogram to be related to extracapsular silicone from prior right breast implant rupture. The overall appearance is stable from the prior exam. No significant lymphadenopathy is noted. Degenerative changes of the thoracic spine are noted. Changes of prior vertebral augmentation are seen at T11. T5 compression fracture is noted of uncertain  chronicity IMPRESSION: New 10 mm left solid pulmonary nodule within the upper lobe. Per Fleischner Society Guidelines, consider a non-contrast Chest CT at 3 months, a PET/CT, or tissue sampling. These guidelines do not apply to immunocompromised patients and patients with cancer. Follow up in patients with significant comorbidities as clinically warranted. For lung cancer screening, adhere to Lung-RADS guidelines. Reference: Radiology. 2017; 284(1):228-43. T5 compression deformity of uncertain chronicity. Correlate to point tenderness. It is new from a prior exam of June of 2022. Changes consistent with prior right breast implant rupture with extracapsular silicone anteriorly. This is stable from the prior exam as well as a prior mammogram. Aortic Atherosclerosis (ICD10-I70.0) and Emphysema (ICD10-J43.9). Electronically Signed   By: Inez Catalina M.D.   On: 06/02/2022 19:23   DG Chest Port 1 View  Result Date: 06/02/2022 CLINICAL DATA:  Short of breath. Patient also altered mental status and hallucinations. EXAM: PORTABLE CHEST 1 VIEW COMPARISON:  02/26/2021 and older exams.  CT, 02/28/2021. FINDINGS: Cardiac silhouette mildly enlarged.  No mediastinal or hilar masses. Bilateral irregularly thickened interstitial markings. Emphysematous changes in the upper lobes. No lung consolidation. No convincing pleural effusion and no pneumothorax. Left anterior chest wall dual lead pacemaker is stable. Right internal jugular small caliber dual lumen central venous catheter, tip in the mid superior vena cava, new since the prior exam. Skeletal structures are grossly intact. IMPRESSION: 1. No acute cardiopulmonary disease. 2. Chronic interstitial thickening.  Upper lobe emphysema. Electronically Signed   By: Lajean Manes M.D.   On: 06/02/2022 15:38    Assessment and Plan:   RHENA GLACE is a 88 y.o. y/o female here today to see [place] to transfer care for pulmonary clinic gastroenterology.  She has a long history of  dysphagia going on for at least 4 years had a barium swallow  by [facility] clinic gastroenterology in March 2023 that showed aspiration, patient refused to swallow barium tablet- lumen of the esophagus had no abnormalities and rapid clearance of barium seen from the esophagus. Was recommended a modified barium swallow which she has not yet obtained.  Her history is suggestive for transfer dysphagia confirming findings seen on the barium swallow.  No esophageal abnormality.  We will proceed with modified barium swallow and swallowing assessment.  Follow up in 1 week after her modified barium swallow  Dr Jonathon Bellows MD,MRCP(U.K)

## 2022-06-27 NOTE — Patient Instructions (Addendum)
Please arrive at the [facility] front entrance on July 29, 2022 at 12:30 PM. No prepping needed.

## 2022-07-04 ENCOUNTER — Telehealth: Payer: Self-pay | Admitting: Pharmacist

## 2022-07-04 NOTE — Telephone Encounter (Signed)
2 bottles of clofazimine are located in the pharmacy office when patient needs a refill.  Yulia Ulrich L. Pinchus Weckwerth, PharmD, BCIDP, AAHIVP, CPP Clinical Pharmacist Practitioner Infectious Diseases [facility] 07/04/2022, 10:27 AM

## 2022-07-11 ENCOUNTER — Other Ambulatory Visit: Payer: Self-pay | Admitting: Nephrology

## 2022-07-11 DIAGNOSIS — N1831 Chronic kidney disease, stage 3a: Secondary | ICD-10-CM

## 2022-07-11 DIAGNOSIS — N179 Acute kidney failure, unspecified: Secondary | ICD-10-CM

## 2022-07-11 DIAGNOSIS — R8290 Unspecified abnormal findings in urine: Secondary | ICD-10-CM

## 2022-07-19 ENCOUNTER — Ambulatory Visit
Admission: RE | Admit: 2022-07-19 | Discharge: 2022-07-19 | Disposition: A | Discharge status: Home / Self Care | Payer: Medicare Other | Source: Ambulatory Visit | Attending: Nephrology | Admitting: Nephrology

## 2022-07-19 DIAGNOSIS — N1831 Chronic kidney disease, stage 3a: Secondary | ICD-10-CM | POA: Diagnosis present

## 2022-07-19 DIAGNOSIS — N179 Acute kidney failure, unspecified: Secondary | ICD-10-CM | POA: Diagnosis present

## 2022-07-19 DIAGNOSIS — E1122 Type 2 diabetes mellitus with diabetic chronic kidney disease: Secondary | ICD-10-CM | POA: Insufficient documentation

## 2022-07-19 DIAGNOSIS — R8290 Unspecified abnormal findings in urine: Secondary | ICD-10-CM | POA: Insufficient documentation

## 2022-07-24 ENCOUNTER — Ambulatory Visit: Admission: RE | Admit: 2022-07-24 | Payer: Medicare Other | Source: Ambulatory Visit

## 2022-07-29 ENCOUNTER — Ambulatory Visit: Payer: Medicare Other

## 2022-08-02 ENCOUNTER — Ambulatory Visit
Admission: RE | Admit: 2022-08-02 | Discharge: 2022-08-02 | Disposition: A | Discharge status: Home / Self Care | Payer: Medicare Other | Source: Ambulatory Visit | Attending: Internal Medicine | Admitting: Internal Medicine

## 2022-08-02 DIAGNOSIS — M869 Osteomyelitis, unspecified: Secondary | ICD-10-CM | POA: Diagnosis not present

## 2022-08-02 LAB — POCT I-STAT CREATININE: Creatinine, Ser: 1.6 mg/dL — ABNORMAL HIGH (ref 0.44–1.00)

## 2022-08-02 MED ORDER — IOHEXOL 300 MG/ML  SOLN: 75.0000 mL | Freq: Once | INTRAMUSCULAR | Status: DC | PRN

## 2022-08-02 MED ORDER — IOHEXOL 300 MG/ML  SOLN
60.0000 mL | Freq: Once | INTRAMUSCULAR | Status: AC | PRN
  Administered 2022-08-02: 60 mL via INTRAVENOUS

## 2022-08-05 ENCOUNTER — Ambulatory Visit: Payer: Medicare Other | Admitting: Gastroenterology

## 2022-08-05 ENCOUNTER — Other Ambulatory Visit: Payer: Self-pay

## 2022-08-07 ENCOUNTER — Other Ambulatory Visit: Payer: Self-pay

## 2022-08-07 ENCOUNTER — Ambulatory Visit (INDEPENDENT_AMBULATORY_CARE_PROVIDER_SITE_OTHER): Payer: Medicare Other | Admitting: Internal Medicine

## 2022-08-07 ENCOUNTER — Encounter: Payer: Self-pay | Admitting: Internal Medicine

## 2022-08-07 VITALS — BP 178/79 | HR 76 | Temp 98.1°F

## 2022-08-07 DIAGNOSIS — A318 Other mycobacterial infections: Secondary | ICD-10-CM | POA: Diagnosis not present

## 2022-08-07 DIAGNOSIS — M869 Osteomyelitis, unspecified: Secondary | ICD-10-CM

## 2022-08-07 NOTE — Patient Instructions (Signed)
Please see me again in 4-6 weeks   Will plan to order another CT scan in around 2 months   Your leg seems to be better based on what I see in clinic and what I see on CT scan   Hopefully at around another 2 months we can go down to a more simple medication regimen   For now, you should be on 4 medications. Please call us / or our pharmacist Cassie will call you and confirm the regimen.
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
## 2022-08-08 ENCOUNTER — Telehealth: Payer: Self-pay | Admitting: Pharmacist

## 2022-08-08 NOTE — Telephone Encounter (Signed)
Called [facility] to verify what IV antibiotics Cassandra [name] was administering. She is getting cefoxitin 2gm IV q12h and amikacin 800 mg IV three times weekly on Tuesday, Thursday, and Saturday. I also called her husband, Cassandra [name], this morning and he listed all of the medications in her bubble pack from [place]. They are listed below:  AM medications: - vitamin D3 - furosemide 20 mg - metformin 500 mg - multivitamin - levothyroxine 100 mcg - metoclopramide 5 mg - linezoild 600 mg* - clofazimine 100 mg* (not in bubble pack) - telmisartan 40 mg  PM medications: - metformin 500 mg - metoclopramide 5 mg - trazodone 50 mg  - desvenlafaxine 5 mg - amitriptyline 10 mg - lantus at 8pm - novolog as prescribed  I confirmed that she is taking the right medications prescribed by Dr. Gale Journey:  - cefoxitin 2gm IV q12h - amikacin 800 mg IV three times weekly - clofazimine 100 mg PO daily - linezolid 600 mg PO daily  [name], PharmD, BCIDP, AAHIVP, CPP Clinical Pharmacist Practitioner Infectious Diseases [facility] 08/08/2022, 11:57 AM

## 2022-08-09 ENCOUNTER — Observation Stay
Admission: EM | Admit: 2022-08-09 | Discharge: 2022-08-10 | Disposition: A | Discharge status: Home / Self Care | Payer: Medicare Other | Attending: Family Medicine | Admitting: Family Medicine

## 2022-08-09 ENCOUNTER — Emergency Department: Payer: Medicare Other

## 2022-08-09 DIAGNOSIS — I5032 Chronic diastolic (congestive) heart failure: Secondary | ICD-10-CM | POA: Insufficient documentation

## 2022-08-09 DIAGNOSIS — I161 Hypertensive emergency: Secondary | ICD-10-CM | POA: Diagnosis not present

## 2022-08-09 DIAGNOSIS — N184 Chronic kidney disease, stage 4 (severe): Secondary | ICD-10-CM | POA: Diagnosis present

## 2022-08-09 DIAGNOSIS — J9601 Acute respiratory failure with hypoxia: Secondary | ICD-10-CM | POA: Diagnosis present

## 2022-08-09 DIAGNOSIS — E039 Hypothyroidism, unspecified: Secondary | ICD-10-CM | POA: Diagnosis not present

## 2022-08-09 DIAGNOSIS — E876 Hypokalemia: Secondary | ICD-10-CM | POA: Diagnosis not present

## 2022-08-09 DIAGNOSIS — E119 Type 2 diabetes mellitus without complications: Secondary | ICD-10-CM

## 2022-08-09 DIAGNOSIS — R0902 Hypoxemia: Principal | ICD-10-CM

## 2022-08-09 DIAGNOSIS — I5033 Acute on chronic diastolic (congestive) heart failure: Secondary | ICD-10-CM | POA: Diagnosis not present

## 2022-08-09 DIAGNOSIS — Z950 Presence of cardiac pacemaker: Secondary | ICD-10-CM | POA: Diagnosis not present

## 2022-08-09 DIAGNOSIS — Z9861 Coronary angioplasty status: Secondary | ICD-10-CM | POA: Diagnosis not present

## 2022-08-09 DIAGNOSIS — Z79899 Other long term (current) drug therapy: Secondary | ICD-10-CM | POA: Insufficient documentation

## 2022-08-09 DIAGNOSIS — R0602 Shortness of breath: Secondary | ICD-10-CM | POA: Diagnosis present

## 2022-08-09 DIAGNOSIS — J449 Chronic obstructive pulmonary disease, unspecified: Secondary | ICD-10-CM | POA: Diagnosis not present

## 2022-08-09 DIAGNOSIS — I2510 Atherosclerotic heart disease of native coronary artery without angina pectoris: Secondary | ICD-10-CM | POA: Diagnosis not present

## 2022-08-09 DIAGNOSIS — I509 Heart failure, unspecified: Secondary | ICD-10-CM

## 2022-08-09 DIAGNOSIS — Z7984 Long term (current) use of oral hypoglycemic drugs: Secondary | ICD-10-CM | POA: Diagnosis not present

## 2022-08-09 DIAGNOSIS — Z87891 Personal history of nicotine dependence: Secondary | ICD-10-CM | POA: Diagnosis not present

## 2022-08-09 DIAGNOSIS — Z85828 Personal history of other malignant neoplasm of skin: Secondary | ICD-10-CM | POA: Insufficient documentation

## 2022-08-09 DIAGNOSIS — Z1152 Encounter for screening for COVID-19: Secondary | ICD-10-CM | POA: Insufficient documentation

## 2022-08-09 DIAGNOSIS — I5042 Chronic combined systolic (congestive) and diastolic (congestive) heart failure: Secondary | ICD-10-CM | POA: Insufficient documentation

## 2022-08-09 DIAGNOSIS — Z794 Long term (current) use of insulin: Secondary | ICD-10-CM | POA: Diagnosis not present

## 2022-08-09 DIAGNOSIS — Z853 Personal history of malignant neoplasm of breast: Secondary | ICD-10-CM | POA: Insufficient documentation

## 2022-08-09 DIAGNOSIS — E1122 Type 2 diabetes mellitus with diabetic chronic kidney disease: Secondary | ICD-10-CM | POA: Insufficient documentation

## 2022-08-09 DIAGNOSIS — I130 Hypertensive heart and chronic kidney disease with heart failure and stage 1 through stage 4 chronic kidney disease, or unspecified chronic kidney disease: Secondary | ICD-10-CM | POA: Insufficient documentation

## 2022-08-09 LAB — CBC WITH DIFFERENTIAL/PLATELET
Abs Immature Granulocytes: 0.11 10*3/uL — ABNORMAL HIGH (ref 0.00–0.07)
Basophils Absolute: 0.1 10*3/uL (ref 0.0–0.1)
Basophils Relative: 2 %
Eosinophils Absolute: 0.7 10*3/uL — ABNORMAL HIGH (ref 0.0–0.5)
Eosinophils Relative: 8 %
HCT: 32.7 % — ABNORMAL LOW (ref 36.0–46.0)
Hemoglobin: 9.4 g/dL — ABNORMAL LOW (ref 12.0–15.0)
Immature Granulocytes: 1 %
Lymphocytes Relative: 16 %
Lymphs Abs: 1.4 10*3/uL (ref 0.7–4.0)
MCH: 28.3 pg (ref 26.0–34.0)
MCHC: 28.7 g/dL — ABNORMAL LOW (ref 30.0–36.0)
MCV: 98.5 fL (ref 80.0–100.0)
Monocytes Absolute: 0.9 10*3/uL (ref 0.1–1.0)
Monocytes Relative: 10 %
Neutro Abs: 5.7 10*3/uL (ref 1.7–7.7)
Neutrophils Relative %: 63 %
Platelets: 265 10*3/uL (ref 150–400)
RBC: 3.32 MIL/uL — ABNORMAL LOW (ref 3.87–5.11)
RDW: 21.4 % — ABNORMAL HIGH (ref 11.5–15.5)
WBC: 8.8 10*3/uL (ref 4.0–10.5)
nRBC: 0 % (ref 0.0–0.2)

## 2022-08-09 LAB — TROPONIN I (HIGH SENSITIVITY): Troponin I (High Sensitivity): 9 ng/L (ref <18)

## 2022-08-09 LAB — BASIC METABOLIC PANEL
Anion gap: 8 (ref 5–15)
BUN: 17 mg/dL (ref 8–23)
CO2: 19 mmol/L — ABNORMAL LOW (ref 22–32)
Calcium: 8.1 mg/dL — ABNORMAL LOW (ref 8.9–10.3)
Chloride: 112 mmol/L — ABNORMAL HIGH (ref 98–111)
Creatinine, Ser: 2.06 mg/dL — ABNORMAL HIGH (ref 0.44–1.00)
GFR, Estimated: 23 mL/min — ABNORMAL LOW (ref >60)
Glucose, Bld: 167 mg/dL — ABNORMAL HIGH (ref 70–99)
Potassium: 4.2 mmol/L (ref 3.5–5.1)
Sodium: 139 mmol/L (ref 135–145)

## 2022-08-09 LAB — RESP PANEL BY RT-PCR (FLU A&B, COVID) ARPGX2
Influenza A by PCR: NEGATIVE
Influenza B by PCR: NEGATIVE
SARS Coronavirus 2 by RT PCR: NEGATIVE

## 2022-08-09 LAB — BRAIN NATRIURETIC PEPTIDE: B Natriuretic Peptide: 1302.5 pg/mL — ABNORMAL HIGH (ref 0.0–100.0)

## 2022-08-09 LAB — CREATININE, SERUM
Creatinine, Ser: 2.13 mg/dL — ABNORMAL HIGH (ref 0.44–1.00)
GFR, Estimated: 22 mL/min — ABNORMAL LOW (ref >60)

## 2022-08-09 LAB — CBG MONITORING, ED: Glucose-Capillary: 171 mg/dL — ABNORMAL HIGH (ref 70–99)

## 2022-08-09 MED ORDER — IRBESARTAN 150 MG PO TABS
150.0000 mg | ORAL_TABLET | Freq: Every day | ORAL | Status: DC
  Administered 2022-08-10: 150 mg via ORAL
  Filled 2022-08-09: qty 1

## 2022-08-09 MED ORDER — INSULIN ASPART 100 UNIT/ML IJ SOLN: 0.0000 [IU] | Freq: Every day | INTRAMUSCULAR | Status: DC

## 2022-08-09 MED ORDER — FUROSEMIDE 10 MG/ML IJ SOLN
60.0000 mg | Freq: Once | INTRAMUSCULAR | Status: AC
  Administered 2022-08-09: 60 mg via INTRAVENOUS
  Filled 2022-08-09: qty 8

## 2022-08-09 MED ORDER — NITROGLYCERIN 0.4 MG SL SUBL: 0.4000 mg | SUBLINGUAL_TABLET | SUBLINGUAL | Status: DC | PRN

## 2022-08-09 MED ORDER — ENOXAPARIN SODIUM 30 MG/0.3ML IJ SOSY
30.0000 mg | PREFILLED_SYRINGE | INTRAMUSCULAR | Status: DC
  Administered 2022-08-10: 30 mg via SUBCUTANEOUS
  Filled 2022-08-09 (×2): qty 0.3

## 2022-08-09 MED ORDER — ACETAMINOPHEN 325 MG RE SUPP: 650.0000 mg | Freq: Four times a day (QID) | RECTAL | Status: DC | PRN

## 2022-08-09 MED ORDER — HYDRALAZINE HCL 20 MG/ML IJ SOLN
5.0000 mg | INTRAMUSCULAR | Status: DC | PRN
  Administered 2022-08-10 (×2): 5 mg via INTRAVENOUS
  Filled 2022-08-09 (×2): qty 1

## 2022-08-09 MED ORDER — FUROSEMIDE 10 MG/ML IJ SOLN
40.0000 mg | Freq: Two times a day (BID) | INTRAMUSCULAR | Status: DC
  Administered 2022-08-09 – 2022-08-10 (×2): 40 mg via INTRAVENOUS
  Filled 2022-08-09 (×2): qty 4

## 2022-08-09 MED ORDER — INSULIN GLARGINE-YFGN 100 UNIT/ML ~~LOC~~ SOLN
5.0000 [IU] | Freq: Every day | SUBCUTANEOUS | Status: DC
  Administered 2022-08-10: 5 [IU] via SUBCUTANEOUS
  Filled 2022-08-09 (×2): qty 0.05

## 2022-08-09 MED ORDER — ALBUTEROL SULFATE (2.5 MG/3ML) 0.083% IN NEBU: 2.5000 mg | INHALATION_SOLUTION | Freq: Four times a day (QID) | RESPIRATORY_TRACT | Status: DC | PRN

## 2022-08-09 MED ORDER — ACETAMINOPHEN 325 MG PO TABS: 650.0000 mg | ORAL_TABLET | Freq: Four times a day (QID) | ORAL | Status: DC | PRN

## 2022-08-09 MED ORDER — INSULIN ASPART 100 UNIT/ML IJ SOLN
0.0000 [IU] | Freq: Three times a day (TID) | INTRAMUSCULAR | Status: DC
  Administered 2022-08-10: 2 [IU] via SUBCUTANEOUS
  Filled 2022-08-09: qty 1

## 2022-08-09 MED ORDER — LEVOTHYROXINE SODIUM 50 MCG PO TABS
100.0000 ug | ORAL_TABLET | Freq: Every day | ORAL | Status: DC
  Administered 2022-08-10: 100 ug via ORAL
  Filled 2022-08-09: qty 2

## 2022-08-09 MED ORDER — ONDANSETRON HCL 4 MG/2ML IJ SOLN: 4.0000 mg | Freq: Four times a day (QID) | INTRAMUSCULAR | Status: DC | PRN

## 2022-08-09 MED ORDER — ROSUVASTATIN CALCIUM 10 MG PO TABS
10.0000 mg | ORAL_TABLET | Freq: Every day | ORAL | Status: DC
  Administered 2022-08-10: 10 mg via ORAL
  Filled 2022-08-09 (×2): qty 1

## 2022-08-09 MED ORDER — ONDANSETRON HCL 4 MG PO TABS: 4.0000 mg | ORAL_TABLET | Freq: Four times a day (QID) | ORAL | Status: DC | PRN

## 2022-08-09 NOTE — Assessment & Plan Note (Signed)
Stable

## 2022-08-09 NOTE — Assessment & Plan Note (Signed)
S/p cardiac catheterization in 2017 which revealed insignificant circumflex disease, 100% proximal RCA, 75% proximal LAD. She underwent PCI of the LAD with placement of a Xience 3.25 x 15 mm drug-eluting stent Follows with Dr. Ubaldo Glassing, last seen June 2023 Continue rosuvastatin, telmisartan, sublingual nitroglycerin as needed chest pain

## 2022-08-09 NOTE — Assessment & Plan Note (Signed)
Continue levothyroxine 

## 2022-08-09 NOTE — ED Provider Triage Note (Signed)
Emergency Medicine Provider Triage Evaluation Note  SHEALEE YORDY , a 88 y.o. female  was evaluated in triage.  Pt complains of shortness of breath for at least a week. No fever. Home health nurse sent her here after noticing crackles in her lungs. She is supposed to have home oxygen prn, but [employer] has not delivered a tank.  Physical Exam  LMP  (LMP Unknown) Comment: age 83 Gen:   Awake, no distress   Resp:  Normal effort  MSK:   Moves extremities without difficulty  Other:    Medical Decision Making  Medically screening exam initiated at 2:30 PM.  Appropriate orders placed.  Ardath Lepak Marple was informed that the remainder of the evaluation will be completed by another provider, this initial triage assessment does not replace that evaluation, and the importance of remaining in the ED until their evaluation is complete.  84% on room air 2l O2 via [place] applied with increase to 95%.   Victorino Dike, FNP 08/09/22 1434

## 2022-08-09 NOTE — Progress Notes (Signed)
Anticoagulation monitoring(Lovenox):  89yo  F ordered Lovenox 40 mg Q24h    Filed Weights   08/09/22 1433  Weight: 64.4 kg (142 lb)   BMI 28   Lab Results  Component Value Date   CREATININE 2.06 (H) 08/09/2022   CREATININE 1.60 (H) 08/02/2022   CREATININE 1.70 (H) 06/04/2022   Estimated Creatinine Clearance: 15.7 mL/min (A) (by C-G formula based on SCr of 2.06 mg/dL (H)). Hemoglobin & Hematocrit     Component Value Date/Time   HGB 9.4 (L) 08/09/2022 1436   HGB 13.1 02/23/2014 1406   HCT 32.7 (L) 08/09/2022 1436   HCT 40.6 02/23/2014 1406     Per Protocol for Patient with estCrcl <30 ml/min and BMI < 30, will transition to Lovenox 30 mg Q24h     Chinita Greenland PharmD Clinical Pharmacist 08/09/2022

## 2022-08-09 NOTE — H&P (Signed)
History and Physical    Patient: Cassandra [name] KVQ:[id number] DOB: 02-18-1933 DOA: 08/09/2022 DOS: the patient was seen and examined on 08/09/2022 PCP: Idelle Crouch, MD  Patient coming from: Home  Chief Complaint:  Chief Complaint  Patient presents with  . Shortness of Breath    HPI: Cassandra [name] is a 88 y.o. female with medical history significant for  CAD with PCI to LAD 2017, PPM for symptomatic bradycardia, , CKD IV, IDDM, hypothyroidism, HTN, COPD, OSA on CPAP and prior history of supplemental oxygen use, who presents to the ED with a 2-day history of shortness of breath with recorded O2 sat in the low 80s.  Patient was hospitalized in October 2023 for hallucinations believed secondary to baclofen and was hypoxic to 78% while asleep and it was attributed to her OSA.  Ambulatory pulse ox was 95 at discharge.  At the present time she denies any associated chest pain, leg swelling or leg pain or fever or chills.  Denies nausea, vomiting, diarrhea or abdominal pain. ED course and Data review: BP high of 198/84 in the ED, tachypneic to 22 and with O2 sat 81% on room air improving to the mid to high 90s on 2 L.  BNP 1302 and troponin pending.  CBC significant for hemoglobin of 9.4 from baseline of 10.02.  WBC normal.  Creatinine 2.06, up from baseline of 1.60 with bicarb of 19.  EKG, personally viewed and interpreted shows atrial paced rhythm with PVCs with no ischemic ST-T wave changes.  Chest x-ray shows findings consistent with mild congestive heart failure and interstitial edema. Patient treated with IV Lasix 60 mg and hospitalist consulted for admission.   Review of Systems: As mentioned in the history of present illness. All other systems reviewed and are negative.  Past Medical History:  Diagnosis Date  . Acquired hypothyroidism 01/06/2014  . Anxiety   . Arthritis   . B12 deficiency 02/15/2014  . Benign essential hypertension 01/06/2014  . Breast cancer [facility]) 1982    Right breast cancer - chemotherapy  . CAD (coronary artery disease), native coronary artery 01/06/2014  . Carotid artery calcification   . Chronic airway obstruction, not elsewhere classified 01/06/2014  . Chronic mesenteric ischemia (HCC)    s/p SMA stent 11/03/19  . Depression   . Diabetes mellitus without complication ([place])   . Dysrhythmia    aflutter with RVR 06/2016  . History of kidney stones 2019   left ureteral stone  . Incontinence of urine   . Myocardial infarction ([place]) 1982   small infarct  . Neuromuscular disorder (HCC)    restless legs  . Pacemaker   . Presence of permanent cardiac pacemaker 07/2016  . Pure hypercholesterolemia 01/06/2014  . Renal insufficiency   . Skin cancer   . Sleep apnea    no longer uses a cpap. Uses oxygen as needed   Past Surgical History:  Procedure Laterality Date  . ANKLE FUSION Left 10/10/2021   Procedure: ANKLE FUSION;  Surgeon: Shona Needles, MD;  Location: [place];  Service: Orthopedics;  Laterality: Left;  . APPENDECTOMY    . APPLICATION OF WOUND VAC Left 02/24/2021   Procedure: APPLICATION OF WOUND VAC;  Surgeon: Rod Can, MD;  Location: [place];  Service: Orthopedics;  Laterality: Left;  . AUGMENTATION MAMMAPLASTY Bilateral 1982/redo in 2014   prior mastectomy  . CARDIAC CATHETERIZATION N/A 04/23/2016   Procedure: Left Heart Cath and Coronary Angiography;  Surgeon: Corey Skains, MD;  Location: [facility]  INVASIVE CV LAB;  Service: Cardiovascular;  Laterality: N/A;  . CARDIAC CATHETERIZATION Left 04/23/2016   Procedure: Coronary Stent Intervention;  Surgeon: Yolonda Kida, MD;  Location: [place] CV LAB;  Service: Cardiovascular;  Laterality: Left;  . CAROTID ARTERY ANGIOPLASTY Left 2010   had stent inserted and removed d/t infection. artery from leg inserted in left carotid  . CAROTID ENDARTERECTOMY Left 2010   left CEA ~ 2010, s/p excision infected left carotid patch & pseudoaneurysm with left CCA-ICA bypass using SVG  05/20/12  . CHOLECYSTECTOMY    . CORONARY ANGIOPLASTY WITH STENT PLACEMENT  september 9th 2017  . CYSTOSCOPY W/ URETERAL STENT PLACEMENT Left 08/17/2017   Procedure: CYSTOSCOPY WITH RETROGRADE PYELOGRAM/URETERAL STENT PLACEMENT;  Surgeon: Festus Aloe, MD;  Location: [facility];  Service: Urology;  Laterality: Left;  . CYSTOSCOPY W/ URETERAL STENT PLACEMENT Left 09/16/2017   Procedure: CYSTOSCOPY WITH STENT REPLACEMENT;  Surgeon: Abbie Sons, MD;  Location: [facility];  Service: Urology;  Laterality: Left;  . CYSTOSCOPY/RETROGRADE/URETEROSCOPY/STONE EXTRACTION WITH BASKET Left 09/16/2017   Procedure: CYSTOSCOPY/RETROGRADE/URETEROSCOPY/STONE EXTRACTION WITH BASKET;  Surgeon: Abbie Sons, MD;  Location: [facility];  Service: Urology;  Laterality: Left;  . EXTERNAL FIXATION LEG Left 02/24/2021   Procedure: EXTERNAL FIXATION  ANKLE;  Surgeon: Rod Can, MD;  Location: [place];  Service: Orthopedics;  Laterality: Left;  . EXTERNAL FIXATION REMOVAL Left 02/26/2021   Procedure: REMOVAL EXTERNAL FIXATION LEG;  Surgeon: Shona Needles, MD;  Location: [place];  Service: Orthopedics;  Laterality: Left;  . EYE SURGERY Bilateral    cataract extraction  . I & D EXTREMITY Left 02/24/2021   Procedure: IRRIGATION AND DEBRIDEMENT EXTREMITY;  Surgeon: Rod Can, MD;  Location: [place];  Service: Orthopedics;  Laterality: Left;  . I & D EXTREMITY Left 12/21/2021   Procedure: IRRIGATION AND DEBRIDEMENT LEFT ANKLE;  Surgeon: Shona Needles, MD;  Location: [place];  Service: Orthopedics;  Laterality: Left;  . I & D EXTREMITY Left 05/03/2022   Procedure: DEBRIDEMENT OF LEFT ANKLE INFECTION;  Surgeon: Shona Needles, MD;  Location: [place];  Service: Orthopedics;  Laterality: Left;  . IR FLUORO GUIDE CV LINE LEFT  12/27/2021  . IR FLUORO GUIDE CV LINE RIGHT  10/12/2021  . IR REMOVAL TUN CV CATH W/O FL  11/22/2021  . IR US GUIDE VASC ACCESS LEFT  12/27/2021  . IR US GUIDE VASC ACCESS RIGHT  10/12/2021  .  KYPHOPLASTY N/A 02/03/2018   Procedure: [id number]-U23;  Surgeon: Hessie Knows, MD;  Location: [facility];  Service: Orthopedics;  Laterality: N/A;  . MASTECTOMY Right 1982  . ORIF ANKLE FRACTURE Left 02/26/2021   Procedure: OPEN REDUCTION INTERNAL FIXATION (ORIF) ANKLE FRACTURE;  Surgeon: Shona Needles, MD;  Location: [place];  Service: Orthopedics;  Laterality: Left;  . PACEMAKER INSERTION Left 07/03/2016   Procedure: INSERTION PACEMAKER;  Surgeon: Isaias Cowman, MD;  Location: [facility];  Service: Cardiovascular;  Laterality: Left;  . SKIN CANCER EXCISION    . TONSILLECTOMY    . TUBAL LIGATION    . VISCERAL ANGIOGRAPHY N/A 11/03/2019   Procedure: VISCERAL ANGIOGRAPHY;  Surgeon: Katha Cabal, MD;  Location: [place] CV LAB;  Service: Cardiovascular;  Laterality: N/A;   Social History:  reports that she quit smoking about 67 years ago. Her smoking use included cigarettes. She started smoking about 68 years ago. She smoked an average of .5 packs per day. She has never used smokeless tobacco. She reports current alcohol use. She reports  that she does not currently use drugs.  Allergies  Allergen Reactions  . Baclofen Other (See Comments)    Hallucinations  . Levofloxacin Other (See Comments)    Hallucinations     Family History  Problem Relation Age of Onset  . Prostate cancer Neg Hx   . Kidney cancer Neg Hx   . Breast cancer Neg Hx     Prior to Admission medications   Medication Sig Start Date End Date Taking? Authorizing Provider  acetaminophen (TYLENOL) 500 MG tablet Take 500-1,000 mg by mouth every 6 (six) hours as needed for moderate pain.    [provider]  albuterol (VENTOLIN HFA) 108 (90 Base) MCG/ACT inhaler Inhale 2 puffs into the lungs every 6 (six) hours as needed for wheezing or shortness of breath. 03/22/21   Angiulli, Lavon Paganini, PA-C  amikacin (AMIKIN) 1 GM/4ML SOLN injection  06/23/22   [provider]  amitriptyline (ELAVIL) 10 MG  tablet Take 1 tablet by mouth at bedtime. 07/17/22 07/17/23  [provider]  cefOXitin (MEFOXIN) 2 g injection Inject into the vein. 06/23/22   [provider]  Cholecalciferol (VITAMIN D3 PO) Take 1 capsule by mouth daily.    [provider]  CLOFAZIMINE PO Take 100 mg by mouth daily.    [provider]  desvenlafaxine (PRISTIQ) 50 MG 24 hr tablet Take 50 mg by mouth daily. 01/22/22   [provider]  doxycycline (VIBRA-TABS) 100 MG tablet Take 100 mg by mouth 2 (two) times daily. Patient not taking: Reported on 08/08/2022 07/01/22   [provider]  furosemide (LASIX) 20 MG tablet Take 20 mg by mouth daily. 01/22/22   [provider]  insulin aspart (NOVOLOG FLEXPEN) 100 UNIT/ML FlexPen Inject 4 Units into the skin daily.    [provider]  insulin glargine (LANTUS SOLOSTAR) 100 UNIT/ML Solostar Pen Inject 8 Units into the skin at bedtime. 06/04/22   Jennye Boroughs, MD  levothyroxine (SYNTHROID) 100 MCG tablet Take 100 mcg by mouth every morning. 01/22/22   [provider]  linezolid (ZYVOX) 600 MG tablet Take 1 tablet (600 mg total) by mouth daily. 04/24/22   Kuppelweiser, Cassie L, RPH-CPP  metFORMIN (GLUCOPHAGE) 500 MG tablet Take 500 mg by mouth 2 (two) times daily with a meal.    [provider]  metoCLOPramide (REGLAN) 5 MG tablet Take 5 mg by mouth 2 (two) times daily. 04/23/22   [provider]  Multiple Vitamin (MULTIVITAMIN WITH MINERALS) TABS tablet Take 1 tablet by mouth daily. 09/24/17   Max Sane, MD  nitroGLYCERIN (NITROSTAT) 0.4 MG SL tablet Place 1 tablet (0.4 mg total) under the tongue every 5 (five) minutes as needed for chest pain. 04/24/16   Bettey Costa, MD  rosuvastatin (CRESTOR) 10 MG tablet Take 10 mg by mouth at bedtime. Patient not taking: Reported on 08/08/2022 05/21/22   [provider]  telmisartan (MICARDIS) 40 MG tablet Take 40 mg by mouth daily. 05/27/22   [provider]  traZODone (DESYREL) 50 MG tablet Take 50 mg by mouth at bedtime.    [provider]    Physical Exam: Vitals:   08/09/22 1433 08/09/22 1820 08/09/22 1830 08/09/22 2000  BP:  (!) 184/94 (!) 198/84   Pulse:  78 78 75  Resp:  20 (!) 22 16  Temp:  98 F (36.7 C)    TempSrc:  Oral    SpO2:  94% 95% 98%  Weight: 64.4 kg  Physical Exam  Labs on Admission: I have personally reviewed following labs and imaging studies  CBC: Recent Labs  Lab 08/09/22 1436  WBC 8.8  NEUTROABS 5.7  HGB 9.4*  HCT 32.7*  MCV 98.5  PLT 353   Basic Metabolic Panel: Recent Labs  Lab 08/09/22 1436  NA 139  K 4.2  CL 112*  CO2 19*  GLUCOSE 167*  BUN 17  CREATININE 2.06*  CALCIUM 8.1*   GFR: Estimated Creatinine Clearance: 15.7 mL/min (A) (by C-G formula based on SCr of 2.06 mg/dL (H)). Liver Function Tests: No results for input(s): "AST", "ALT", "ALKPHOS", "BILITOT", "PROT", "ALBUMIN" in the last 168 hours. No results for input(s): "LIPASE", "AMYLASE" in the last 168 hours. No results for input(s): "AMMONIA" in the last 168 hours. Coagulation Profile: No results for input(s): "INR", "PROTIME" in the last 168 hours. Cardiac Enzymes: No results for input(s): "CKTOTAL", "CKMB", "CKMBINDEX", "TROPONINI" in the last 168 hours. BNP (last 3 results) No results for input(s): "PROBNP" in the last 8760 hours. HbA1C: No results for input(s): "HGBA1C" in the last 72 hours. CBG: No results for input(s): "GLUCAP" in the last 168 hours. Lipid Profile: No results for input(s): "CHOL", "HDL", "LDLCALC", "TRIG", "CHOLHDL", "LDLDIRECT" in the last 72 hours. Thyroid Function Tests: No results for input(s): "TSH", "T4TOTAL", "FREET4", "T3FREE", "THYROIDAB" in the last 72 hours. Anemia Panel: No results for input(s): "VITAMINB12", "FOLATE", "FERRITIN", "TIBC", "IRON", "RETICCTPCT" in the last 72 hours. Urine analysis:    Component Value Date/Time   COLORURINE YELLOW (A)  06/02/2022 1508   APPEARANCEUR CLEAR (A) 06/02/2022 1508   APPEARANCEUR Clear 10/27/2017 1508   LABSPEC 1.011 06/02/2022 1508   LABSPEC 1.008 05/11/2012 1400   PHURINE 6.0 06/02/2022 1508   GLUCOSEU NEGATIVE 06/02/2022 1508   GLUCOSEU Negative 05/11/2012 1400   HGBUR NEGATIVE 06/02/2022 1508   BILIRUBINUR NEGATIVE 06/02/2022 1508   BILIRUBINUR Negative 10/27/2017 1508   BILIRUBINUR Negative 05/11/2012 1400   KETONESUR NEGATIVE 06/02/2022 1508   PROTEINUR 30 (A) 06/02/2022 1508   NITRITE NEGATIVE 06/02/2022 1508   LEUKOCYTESUR NEGATIVE 06/02/2022 1508   LEUKOCYTESUR Trace 05/11/2012 1400    Radiological Exams on Admission: DG Chest 2 View  Result Date: 08/09/2022 CLINICAL DATA:  Short of breath, hypoxia EXAM: CHEST - 2 VIEW COMPARISON:  06/02/2022 FINDINGS: Frontal and lateral views of the chest demonstrates stable dual lead pacemaker. Right internal jugular catheter tip overlies superior vena cava. The cardiac silhouette is enlarged. There is increased interstitial prominence since prior study, with trace bilateral pleural effusions. No focal consolidation or pneumothorax. No acute bony abnormalities. IMPRESSION: 1. Findings consistent with mild congestive heart failure and interstitial edema. Electronically Signed   By: Randa Ngo M.D.   On: 08/09/2022 15:22     Data Reviewed: Relevant notes from primary care and specialist visits, past discharge summaries as available in EHR, including Care Everywhere. Prior diagnostic testing as pertinent to current admission diagnoses Updated medications and problem lists for reconciliation ED course, including vitals, labs, imaging, treatment and response to treatment Triage notes, nursing and pharmacy notes and ED provider's notes Notable results as noted in HPI   Assessment and Plan: * Acute CHF (congestive heart failure) ([place]) Hypertensive emergency Acute respiratory failure with hypoxia No prior history of CHF Echocardiogram in  October 2023 with EF 60 to 65% and indeterminate diastolic parameters BNP over thousand and chest x-ray showed mild CHF.  O2 sats 81 on room air and remote history of requiring supplemental O2 Continue IV Lasix.  Continue ARB.  Start beta-blocker prior to discharge BP control Get troponin and trend to evaluate for ACS given history of CAD, though patient denies chest pain and EKG nonacute Daily weights with intake and output monitoring Supplemental oxygen to keep sats over 94 Home O2 eval prior to discharge  CAD S/P percutaneous coronary angioplasty S/p cardiac catheterization in 2017 which revealed insignificant circumflex disease, 100% proximal RCA, 75% proximal LAD. She underwent PCI of the LAD with placement of a Xience 3.25 x 15 mm drug-eluting stent Follows with Dr. Ubaldo Glassing, last seen June 2023 Continue rosuvastatin, telmisartan, sublingual nitroglycerin as needed chest pain  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 Arrange for pacemaker check  COPD (chronic obstructive pulmonary disease) ([place]) Not acutely exacerbated.  Continue albuterol as needed  Acquired hypothyroidism Continue levothyroxine  Insulin-requiring or dependent type II diabetes mellitus (HCC) Continue basal insulin Sliding scale insulin coverage  CKD (chronic kidney disease) stage 4, GFR 15-29 ml/min (HCC) Renal function at baseline    DVT prophylaxis: Lovenox  Consults: [facility] Dr Nehemiah Massed  Advance Care Planning:   Code Status: Prior   Family Communication: none  Disposition Plan: Back to previous home environment  Severity of Illness: The appropriate patient status for this patient is INPATIENT. Inpatient status is judged to be reasonable and necessary in order to provide the required intensity of service to ensure the patient's safety. The patient's presenting symptoms, physical exam findings, and initial radiographic and laboratory data in the context of their chronic comorbidities is felt to place  them at high risk for further clinical deterioration. Furthermore, it is not anticipated that the patient will be medically stable for discharge from the hospital within 2 midnights of admission.   * I certify that at the point of admission it is my clinical judgment that the patient will require inpatient hospital care spanning beyond 2 midnights from the point of admission due to high intensity of service, high risk for further deterioration and high frequency of surveillance required.*  Author: Athena Masse, MD 08/09/2022 8:11 PM  For on call review [email or web address].

## 2022-08-09 NOTE — Assessment & Plan Note (Addendum)
Hypertensive emergency Acute respiratory failure with hypoxia No prior history of CHF Echocardiogram in October 2023 with EF 60 to 65% and indeterminate diastolic parameters BNP over thousand and chest x-ray showed mild CHF.  O2 sats 81 on room air and remote history of requiring supplemental O2 Continue IV Lasix.  Continue ARB.  Start beta-blocker prior to discharge BP control Get troponin and trend to evaluate for ACS given history of CAD, though patient denies chest pain and EKG nonacute Daily weights with intake and output monitoring Supplemental oxygen to keep sats over 94 Home O2 eval prior to discharge

## 2022-08-09 NOTE — ED Triage Notes (Signed)
Pt arrived via POV from home where her home nurse found pt spo2 to be in the low 80's on RA. Pt is suppose to be on O2 however she did not wear it and [facility] took the oxygen equipment back. Pt husband has been calling them to get the o2 back in the house but has been unable to.

## 2022-08-09 NOTE — Assessment & Plan Note (Signed)
Continue home regimen except metformin is discontinued secondary to renal function.

## 2022-08-09 NOTE — Assessment & Plan Note (Addendum)
Not acutely exacerbated.  Continue albuterol as needed

## 2022-08-09 NOTE — Assessment & Plan Note (Signed)
Noted.

## 2022-08-09 NOTE — ED Provider Notes (Signed)
[facility] Provider Note    Event Date/Time   First MD Initiated Contact with Patient 08/09/22 1818     (approximate)   History   Shortness of Breath   HPI  Cassandra [name] is a 88 y.o. female   who presents to the emergency department today because of shortness of breath.  Has been getting worse over the past 3 days.  Home health checked her oxygen level today and found that her levels were in the 80s. Patient states that she was on oxygen at one time a couple of years ago but was able to be taken off of it. She has noticed some swelling in her legs. Additionally she states she has been dealing with an infection in her left ankle post surgical repair and is on antibiotics for that.       Physical Exam   Triage Vital Signs: ED Triage Vitals  Enc Vitals Group     BP 08/09/22 1429 (!) 182/81     Pulse Rate 08/09/22 1429 78     Resp 08/09/22 1429 18     Temp 08/09/22 1820 98 F (36.7 C)     Temp Source 08/09/22 1820 Oral     SpO2 08/09/22 1429 (!) 81 %     Weight 08/09/22 1433 142 lb (64.4 kg)     Height --      Head Circumference --      Peak Flow --      Pain Score 08/09/22 1433 0     Pain Loc --      Pain Edu? --      Excl. in [place]? --     Most recent vital signs: Vitals:   08/09/22 1431 08/09/22 1820  BP:  (!) 184/94  Pulse:  78  Resp:  20  Temp:  98 F (36.7 C)  SpO2: 96% 94%   General: Awake, alert, oriented. CV:  Good peripheral perfusion. Regular rate and rhythm. Resp:  Normal effort. Crackles in bilateral bases.  Abd:  No distention. Non tender.    ED Results / Procedures / Treatments   Labs (all labs ordered are listed, but only abnormal results are displayed) Labs Reviewed  CBC WITH DIFFERENTIAL/PLATELET - Abnormal; Notable for the following components:      Result Value   RBC 3.32 (*)    Hemoglobin 9.4 (*)    HCT 32.7 (*)    MCHC 28.7 (*)    RDW 21.4 (*)    Eosinophils Absolute 0.7 (*)    Abs Immature  Granulocytes 0.11 (*)    All other components within normal limits  BASIC METABOLIC PANEL - Abnormal; Notable for the following components:   Chloride 112 (*)    CO2 19 (*)    Glucose, Bld 167 (*)    Creatinine, Ser 2.06 (*)    Calcium 8.1 (*)    GFR, Estimated 23 (*)    All other components within normal limits  BRAIN NATRIURETIC PEPTIDE - Abnormal; Notable for the following components:   B Natriuretic Peptide 1,302.5 (*)    All other components within normal limits     EKG  I, Nance Pear, attending physician, personally viewed and interpreted this EKG  EKG Time: 1435 Rate: 75 Rhythm: atrial sensed ventricular paced rhythm Axis: left axis deviation Intervals: qtc 460 QRS: Wide ST changes: no st elevation Impression: abnormal ekg   RADIOLOGY I independently interpreted and visualized the CXR. My interpretation: Cardiomegaly Radiology interpretation:  IMPRESSION:  1. Findings consistent with mild congestive heart failure and  interstitial edema.      PROCEDURES:  Critical Care performed: No  Procedures   MEDICATIONS ORDERED IN ED: Medications  furosemide (LASIX) injection 60 mg (has no administration in time range)     IMPRESSION / MDM / ASSESSMENT AND PLAN / ED COURSE  I reviewed the triage vital signs and the nursing notes.                              Differential diagnosis includes, but is not limited to, pneumonia, CHF  Patient's presentation is most consistent with acute presentation with potential threat to life or bodily function.   The patient is on the cardiac monitor to evaluate for evidence of arrhythmia and/or significant heart rate changes.  Patient presented to the emergency department today because of concerns for shortness of breath and hypoxia.  Patient was hypoxic here in the emergency department.  Workup is consistent with fluid overload with elevated BNP and pulmonary edema seen on chest x-ray.  Patient was given a dose of  Lasix here in the emergency department.  Discussed with Dr. Damita Dunnings with the hospitalist service who will plan on admission     FINAL CLINICAL IMPRESSION(S) / ED DIAGNOSES   Final diagnoses:  Hypoxia  SOB (shortness of breath)  Congestive heart failure, unspecified HF chronicity, unspecified heart failure type [facility])    Note:  This document was prepared using Dragon voice recognition software and may include unintentional dictation errors.    Nance Pear, MD 08/09/22 2006

## 2022-08-10 DIAGNOSIS — E119 Type 2 diabetes mellitus without complications: Secondary | ICD-10-CM

## 2022-08-10 DIAGNOSIS — E876 Hypokalemia: Secondary | ICD-10-CM

## 2022-08-10 DIAGNOSIS — N184 Chronic kidney disease, stage 4 (severe): Secondary | ICD-10-CM

## 2022-08-10 DIAGNOSIS — I509 Heart failure, unspecified: Secondary | ICD-10-CM

## 2022-08-10 DIAGNOSIS — I5033 Acute on chronic diastolic (congestive) heart failure: Secondary | ICD-10-CM | POA: Diagnosis not present

## 2022-08-10 DIAGNOSIS — I161 Hypertensive emergency: Secondary | ICD-10-CM

## 2022-08-10 DIAGNOSIS — Z794 Long term (current) use of insulin: Secondary | ICD-10-CM

## 2022-08-10 DIAGNOSIS — E039 Hypothyroidism, unspecified: Secondary | ICD-10-CM

## 2022-08-10 DIAGNOSIS — J9601 Acute respiratory failure with hypoxia: Secondary | ICD-10-CM | POA: Diagnosis not present

## 2022-08-10 LAB — BASIC METABOLIC PANEL
Anion gap: 7 (ref 5–15)
BUN: 18 mg/dL (ref 8–23)
CO2: 29 mmol/L (ref 22–32)
Calcium: 8.5 mg/dL — ABNORMAL LOW (ref 8.9–10.3)
Chloride: 106 mmol/L (ref 98–111)
Creatinine, Ser: 1.95 mg/dL — ABNORMAL HIGH (ref 0.44–1.00)
GFR, Estimated: 24 mL/min — ABNORMAL LOW (ref >60)
Glucose, Bld: 109 mg/dL — ABNORMAL HIGH (ref 70–99)
Potassium: 3.4 mmol/L — ABNORMAL LOW (ref 3.5–5.1)
Sodium: 142 mmol/L (ref 135–145)

## 2022-08-10 LAB — CBG MONITORING, ED
Glucose-Capillary: 101 mg/dL — ABNORMAL HIGH (ref 70–99)
Glucose-Capillary: 136 mg/dL — ABNORMAL HIGH (ref 70–99)
Glucose-Capillary: 82 mg/dL (ref 70–99)

## 2022-08-10 LAB — TROPONIN I (HIGH SENSITIVITY): Troponin I (High Sensitivity): 12 ng/L (ref <18)

## 2022-08-10 MED ORDER — METOPROLOL TARTRATE 25 MG PO TABS
25.0000 mg | ORAL_TABLET | Freq: Two times a day (BID) | ORAL | Status: DC
  Administered 2022-08-10: 25 mg via ORAL
  Filled 2022-08-10: qty 1

## 2022-08-10 MED ORDER — POTASSIUM CHLORIDE CRYS ER 20 MEQ PO TBCR
40.0000 meq | EXTENDED_RELEASE_TABLET | Freq: Once | ORAL | Status: AC
  Administered 2022-08-10: 40 meq via ORAL
  Filled 2022-08-10: qty 2

## 2022-08-10 MED ORDER — METOPROLOL TARTRATE 25 MG PO TABS: 25.0000 mg | ORAL_TABLET | Freq: Two times a day (BID) | ORAL | 2 refills | Status: DC

## 2022-08-10 NOTE — Care Management CC44 (Signed)
Condition Code 44 Documentation Completed  Patient Details  Name: DEREON WILLIAMSEN MRN: [id number] Date of Birth: [birth year]   Condition Code 44 given:  Yes Patient signature on Condition Code 44 notice:  Yes Documentation of 2 MD's agreement:  Yes Code 44 added to claim:  Yes    Raina Mina, [place] 08/10/2022, 4:48 PM

## 2022-08-10 NOTE — Assessment & Plan Note (Signed)
Secondary to acute heart failure, although patient also states she has been trying to get oxygen as an outpatient. Likely component of oxygen requirement from underlying COPD. Patient qualified for oxygen on discharge.

## 2022-08-10 NOTE — ED Notes (Signed)
Pt in bed, pt denies pain, pt iv has some dried blood in the dressing, changed dressing, flush iv without pain or swelling. Pt has no requests at this time, meal tray given

## 2022-08-10 NOTE — Hospital Course (Signed)
Cassandra [name] is a 88 y.o. female with a history of CAD s/p PCI, symptomatic bradycardia s/p PPM, CKD stage IV, diabetes mellitus, hypothyroidism, hypertension, COPD, OSA on CPAP. Patient presented secondary to shortness of breath and found to have evidence of acute heart failure with associated hypertensive emergency. Patient started on IV Lasix and IV hydralazine with improvement in symptoms. Patient required oxygen and qualified for home oxygen prior to discharge. Cardiology consulted and recommended discharge home with addition of metoprolol to home regimen.

## 2022-08-10 NOTE — Progress Notes (Signed)
CSW contacted [employer] to order home oxygen. Oxygen will be delivered to the room before discharge.

## 2022-08-10 NOTE — ED Notes (Signed)
Pt ambulatory on 2L via [place], pt maintained an O2 sat of 88-94%, pt talking when walking, pt had a slight increase in rr and work of breathing. Pt states that she feels much better than yesterday. Pt ambulatory with a walker.

## 2022-08-10 NOTE — Consult Note (Signed)
[facility] Consultation Note  Patient ID: Cassandra [name], MRN: [id number], DOB/AGE: 10/15/1932 88 y.o. Admit date: 08/09/2022   Date of Consult: 08/10/2022 Primary Physician: Idelle Crouch, MD Primary Cardiologist: Unknown  Chief Complaint:  Chief Complaint  Patient presents with   Shortness of Breath   Reason for Consult:Heart failure  HPI: 88 y.o. female with known coronary artery disease previous heart block and pacemaker placement anemia with a hemoglobin of 9 and chronic kidney disease stage IV with a glomerular filtration rate of 23.  The patient comes in with significant progression of shortness of breath PND and orthopnea and lower extremity edema consistent with acute on chronic diastolic dysfunction congestive heart failure.  Patient has had previous echocardiogram showing normal LV systolic function with ejection fraction of 60%.  EKG has shown normal sinus rhythm with left axis deviation and right bundle branch block unchanged from before.  Chest x-ray show some pulmonary edema.  Patient has multiple factors for her current admission but has had a significant urine output of 4600 with intravenous Lasix since admission and feels much better at this time.  There is no evidence of acute coronary syndrome  Past Medical History:  Diagnosis Date   Acquired hypothyroidism 01/06/2014   Anxiety    Arthritis    B12 deficiency 02/15/2014   Benign essential hypertension 01/06/2014   Breast cancer ([place]) 1982   Right breast cancer - chemotherapy   CAD (coronary artery disease), native coronary artery 01/06/2014   Carotid artery calcification    Chronic airway obstruction, not elsewhere classified 01/06/2014   Chronic mesenteric ischemia (HCC)    s/p SMA stent 11/03/19   Depression    Diabetes mellitus without complication ([place])    Dysrhythmia    aflutter with RVR 06/2016   History of kidney stones 2019   left ureteral stone   Incontinence of urine    Myocardial  infarction ([place]) 1982   small infarct   Neuromuscular disorder (HCC)    restless legs   Pacemaker    Presence of permanent cardiac pacemaker 07/2016   Pure hypercholesterolemia 01/06/2014   Renal insufficiency    Skin cancer    Sleep apnea    no longer uses a cpap. Uses oxygen as needed      Surgical History:  Past Surgical History:  Procedure Laterality Date   ANKLE FUSION Left 10/10/2021   Procedure: ANKLE FUSION;  Surgeon: Shona Needles, MD;  Location: [place];  Service: Orthopedics;  Laterality: Left;   APPENDECTOMY     APPLICATION OF WOUND VAC Left 02/24/2021   Procedure: APPLICATION OF WOUND VAC;  Surgeon: Rod Can, MD;  Location: [place];  Service: Orthopedics;  Laterality: Left;   AUGMENTATION MAMMAPLASTY Bilateral 1982/redo in 2014   prior mastectomy   CARDIAC CATHETERIZATION N/A 04/23/2016   Procedure: Left Heart Cath and Coronary Angiography;  Surgeon: Corey Skains, MD;  Location: [place] [facility];  Service: Cardiovascular;  Laterality: N/A;   CARDIAC CATHETERIZATION Left 04/23/2016   Procedure: Coronary Stent Intervention;  Surgeon: Yolonda Kida, MD;  Location: [place] [facility];  Service: Cardiovascular;  Laterality: Left;   CAROTID ARTERY ANGIOPLASTY Left 2010   had stent inserted and removed d/t infection. artery from leg inserted in left carotid   CAROTID ENDARTERECTOMY Left 2010   left CEA ~ 2010, s/p excision infected left carotid patch & pseudoaneurysm with left CCA-ICA bypass using SVG 05/20/12   CHOLECYSTECTOMY     CORONARY ANGIOPLASTY WITH  STENT PLACEMENT  september 9th 2017   CYSTOSCOPY W/ URETERAL STENT PLACEMENT Left 08/17/2017   Procedure: CYSTOSCOPY WITH RETROGRADE PYELOGRAM/URETERAL STENT PLACEMENT;  Surgeon: Festus Aloe, MD;  Location: [facility];  Service: Urology;  Laterality: Left;   CYSTOSCOPY W/ URETERAL STENT PLACEMENT Left 09/16/2017   Procedure: CYSTOSCOPY WITH STENT REPLACEMENT;  Surgeon: Abbie Sons, MD;  Location:  [facility];  Service: Urology;  Laterality: Left;   CYSTOSCOPY/RETROGRADE/URETEROSCOPY/STONE EXTRACTION WITH BASKET Left 09/16/2017   Procedure: CYSTOSCOPY/RETROGRADE/URETEROSCOPY/STONE EXTRACTION WITH BASKET;  Surgeon: Abbie Sons, MD;  Location: [facility];  Service: Urology;  Laterality: Left;   EXTERNAL FIXATION LEG Left 02/24/2021   Procedure: EXTERNAL FIXATION  ANKLE;  Surgeon: Rod Can, MD;  Location: [place];  Service: Orthopedics;  Laterality: Left;   EXTERNAL FIXATION REMOVAL Left 02/26/2021   Procedure: REMOVAL EXTERNAL FIXATION LEG;  Surgeon: Shona Needles, MD;  Location: [place];  Service: Orthopedics;  Laterality: Left;   EYE SURGERY Bilateral    cataract extraction   I & D EXTREMITY Left 02/24/2021   Procedure: IRRIGATION AND DEBRIDEMENT EXTREMITY;  Surgeon: Rod Can, MD;  Location: [place];  Service: Orthopedics;  Laterality: Left;   I & D EXTREMITY Left 12/21/2021   Procedure: IRRIGATION AND DEBRIDEMENT LEFT ANKLE;  Surgeon: Shona Needles, MD;  Location: [place];  Service: Orthopedics;  Laterality: Left;   I & D EXTREMITY Left 05/03/2022   Procedure: DEBRIDEMENT OF LEFT ANKLE INFECTION;  Surgeon: Shona Needles, MD;  Location: [place];  Service: Orthopedics;  Laterality: Left;   IR FLUORO GUIDE CV LINE LEFT  12/27/2021   IR FLUORO GUIDE CV LINE RIGHT  10/12/2021   IR REMOVAL TUN CV CATH W/O FL  11/22/2021   IR US GUIDE VASC ACCESS LEFT  12/27/2021   IR US GUIDE VASC ACCESS RIGHT  10/12/2021   KYPHOPLASTY N/A 02/03/2018   Procedure: [id number];  Surgeon: Hessie Knows, MD;  Location: [facility];  Service: Orthopedics;  Laterality: N/A;   MASTECTOMY Right 1982   ORIF ANKLE FRACTURE Left 02/26/2021   Procedure: OPEN REDUCTION INTERNAL FIXATION (ORIF) ANKLE FRACTURE;  Surgeon: Shona Needles, MD;  Location: [place];  Service: Orthopedics;  Laterality: Left;   PACEMAKER INSERTION Left 07/03/2016   Procedure: INSERTION PACEMAKER;  Surgeon: Isaias Cowman, MD;  Location:  [facility];  Service: Cardiovascular;  Laterality: Left;   SKIN CANCER EXCISION     TONSILLECTOMY     TUBAL LIGATION     VISCERAL ANGIOGRAPHY N/A 11/03/2019   Procedure: VISCERAL ANGIOGRAPHY;  Surgeon: Katha Cabal, MD;  Location: [place] [facility];  Service: Cardiovascular;  Laterality: N/A;     Home Meds: Prior to Admission medications   Medication Sig Start Date End Date Taking? Authorizing Provider  acetaminophen (TYLENOL) 500 MG tablet Take 500-1,000 mg by mouth every 6 (six) hours as needed for moderate pain.    [provider]  albuterol (VENTOLIN HFA) 108 (90 Base) MCG/ACT inhaler Inhale 2 puffs into the lungs every 6 (six) hours as needed for wheezing or shortness of breath. 03/22/21   Angiulli, Lavon Paganini, PA-C  amikacin (AMIKIN) 1 GM/4ML SOLN injection  06/23/22   [provider]  amitriptyline (ELAVIL) 10 MG tablet Take 1 tablet by mouth at bedtime. 07/17/22 07/17/23  [provider]  cefOXitin (MEFOXIN) 2 g injection Inject into the vein. 06/23/22   [provider]  Cholecalciferol (VITAMIN D3 PO) Take 1 capsule by mouth daily.    [provider]  CLOFAZIMINE PO Take 100 mg by mouth daily.    [provider]  desvenlafaxine (PRISTIQ) 50 MG 24 hr tablet Take 50 mg by mouth daily. 01/22/22   [provider]  doxycycline (VIBRA-TABS) 100 MG tablet Take 100 mg by mouth 2 (two) times daily. Patient not taking: Reported on 08/08/2022 07/01/22   [provider]  furosemide (LASIX) 20 MG tablet Take 20 mg by mouth daily. 01/22/22   [provider]  insulin aspart (NOVOLOG FLEXPEN) 100 UNIT/ML FlexPen Inject 4 Units into the skin daily.    [provider]  insulin glargine (LANTUS SOLOSTAR) 100 UNIT/ML Solostar Pen Inject 8 Units into the skin at bedtime. 06/04/22   Jennye Boroughs, MD  levothyroxine (SYNTHROID) 100 MCG tablet Take 100 mcg by mouth every morning. 01/22/22   [provider]   linezolid (ZYVOX) 600 MG tablet Take 1 tablet (600 mg total) by mouth daily. 04/24/22   Kuppelweiser, Cassie L, RPH-CPP  metFORMIN (GLUCOPHAGE) 500 MG tablet Take 500 mg by mouth 2 (two) times daily with a meal.    [provider]  metoCLOPramide (REGLAN) 5 MG tablet Take 5 mg by mouth 2 (two) times daily. 04/23/22   [provider]  Multiple Vitamin (MULTIVITAMIN WITH MINERALS) TABS tablet Take 1 tablet by mouth daily. 09/24/17   Max Sane, MD  nitroGLYCERIN (NITROSTAT) 0.4 MG SL tablet Place 1 tablet (0.4 mg total) under the tongue every 5 (five) minutes as needed for chest pain. 04/24/16   Bettey Costa, MD  rosuvastatin (CRESTOR) 10 MG tablet Take 10 mg by mouth at bedtime. Patient not taking: Reported on 08/08/2022 05/21/22   [provider]  telmisartan (MICARDIS) 40 MG tablet Take 40 mg by mouth daily. 05/27/22   [provider]  traZODone (DESYREL) 50 MG tablet Take 50 mg by mouth at bedtime.    [provider]    Inpatient Medications:   enoxaparin (LOVENOX) injection  30 mg Subcutaneous Q24H   furosemide  40 mg Intravenous Q12H   insulin aspart  0-15 Units Subcutaneous TID WC   insulin aspart  0-5 Units Subcutaneous QHS   insulin glargine-yfgn  5 Units Subcutaneous Daily   irbesartan  150 mg Oral Daily   levothyroxine  100 mcg Oral Q0600   rosuvastatin  10 mg Oral QHS     Allergies:  Allergies  Allergen Reactions   Baclofen Other (See Comments)    Hallucinations   Levofloxacin Other (See Comments)    Hallucinations     Social History   Socioeconomic History   Marital status: Married    Spouse name: [name]   Number of children: 3   Years of education: Not on file   Highest education level: High school graduate  Occupational History   Not on file  Tobacco Use   Smoking status: Former    Packs/day: 0.50    Types: Cigarettes    Start date: 1955    Quit date: 1956    Years since quitting: 67.9   Smokeless tobacco: Never   Vaping Use   Vaping Use: Never used  Substance and Sexual Activity   Alcohol use: Yes    Comment: occasional wine   Drug use: Not Currently   Sexual activity: Not Currently  Other Topics Concern   Not on file  Social History Narrative   ** Merged History Encounter **       Lives with husband and dog    Social Determinants of Health   Financial  Resource Strain: Low Risk  (10/04/2018)   Overall Financial Resource Strain (CARDIA)    Difficulty of Paying Living Expenses: Not hard at all  Food Insecurity: No Food Insecurity (10/04/2018)   Hunger Vital Sign    Worried About Running Out of Food in the Last Year: Never true    [place] in the Last Year: Never true  Transportation Needs: No Transportation Needs (10/04/2018)   PRAPARE - Hydrologist (Medical): No    Lack of Transportation (Non-Medical): No  Physical Activity: Unknown (10/04/2018)   Exercise Vital Sign    Days of Exercise per Week: Patient refused    Minutes of Exercise per Session: Patient refused  Stress: Not on file  Social Connections: Unknown (10/04/2018)   Social Connection and Isolation Panel [NHANES]    Frequency of Communication with Friends and Family: Patient refused    Frequency of Social Gatherings with Friends and Family: Patient refused    Attends Religious Services: Patient refused    Active Member of Clubs or Organizations: Patient refused    Attends Archivist Meetings: Patient refused    Marital Status: Patient refused  Intimate Partner Violence: Unknown (10/04/2018)   Humiliation, Afraid, Rape, and Kick questionnaire    Fear of Current or Ex-Partner: Patient refused    Emotionally Abused: Patient refused    Physically Abused: Patient refused    Sexually Abused: Patient refused     Family History  Problem Relation Age of Onset   Prostate cancer Neg Hx    Kidney cancer Neg Hx    Breast cancer Neg Hx      Review of Systems Positive for shortness of  breath Negative for: General:  chills, fever, night sweats or weight changes.  Cardiovascular: Positive for PND orthopnea negative for syncope dizziness  Dermatological skin lesions rashes Respiratory: Cough congestion Urologic: Frequent urination urination at night and hematuria Abdominal: negative for nausea, vomiting, diarrhea, bright red blood per rectum, melena, or hematemesis Neurologic: negative for visual changes, and/or hearing changes  All other systems reviewed and are otherwise negative except as noted above.  Labs: No results for input(s): "CKTOTAL", "CKMB", "TROPONINI" in the last 72 hours. Lab Results  Component Value Date   WBC 8.8 08/09/2022   HGB 9.4 (L) 08/09/2022   HCT 32.7 (L) 08/09/2022   MCV 98.5 08/09/2022   PLT 265 08/09/2022    Recent Labs  Lab 08/10/22 0418  NA 142  K 3.4*  CL 106  CO2 29  BUN 18  CREATININE 1.95*  CALCIUM 8.5*  GLUCOSE 109*   Lab Results  Component Value Date   CHOL 128 06/03/2022   HDL 40 (L) 06/03/2022   LDLCALC 60 06/03/2022   TRIG 142 06/03/2022   No results found for: "DDIMER"  Radiology/Studies:  DG Chest 2 View  Result Date: 08/09/2022 CLINICAL DATA:  Short of breath, hypoxia EXAM: CHEST - 2 VIEW COMPARISON:  06/02/2022 FINDINGS: Frontal and lateral views of the chest demonstrates stable dual lead pacemaker. Right internal jugular catheter tip overlies superior vena cava. The cardiac silhouette is enlarged. There is increased interstitial prominence since prior study, with trace bilateral pleural effusions. No focal consolidation or pneumothorax. No acute bony abnormalities. IMPRESSION: 1. Findings consistent with mild congestive heart failure and interstitial edema. Electronically Signed   By: Randa Ngo M.D.   On: 08/09/2022 15:22   CT TIBIA FIBULA LEFT W CONTRAST  Result Date: 08/04/2022 CLINICAL DATA:  Follow-up osteomyelitis EXAM:  CT OF THE LOWER LEFT EXTREMITY WITH CONTRAST TECHNIQUE: Multidetector CT  imaging of the lower left extremity was performed according to the standard protocol following intravenous contrast administration. RADIATION DOSE REDUCTION: This exam was performed according to the departmental dose-optimization program which includes automated exposure control, adjustment of the mA and/or kV according to patient size and/or use of iterative reconstruction technique. CONTRAST:  66m OMNIPAQUE IOHEXOL 300 MG/ML  SOLN COMPARISON:  X-ray 05/03/2022, CT 04/12/2022 FINDINGS: Bones/Joint/Cartilage Diffuse osseous demineralization. Since the previous CT, there has been interval placement of antibiotic cement spacer at the distal tibial metaphysis and medial malleolus. Increasing sclerosis within the distal tibia adjacent to the antibiotic spacer. The degree of bone loss of the distal tibia and talar dome and talar neck has not appreciably progressed from the previous CT. No new sites of bone erosion. Posttraumatic and postsurgical changes of the distal tibia and fibula. No acute fracture or dislocation. Alignment of the hindfoot and midfoot are maintained. Similar degenerative changes of the knee and foot. Several loose bodies within a Baker's cyst. Ligaments Suboptimally assessed by CT. Muscles and Tendons Generalized muscle atrophy. No intramuscular fluid collection. Tendinous structures remain grossly intact. Soft tissues There appears to be developing heterotopic calcification along the anterolateral margin of the distal tibia. Circumferential subcutaneous edema and fluid. No organized or rim enhancing fluid collections. No soft tissue gas. IMPRESSION: 1. Since the previous CT, there has been interval placement of antibiotic cement spacer at the distal tibial metaphysis and medial malleolus. Increasing sclerosis within the distal tibia adjacent to the antibiotic spacer. The degree of bone loss of the distal tibia and talar dome and talar neck has not appreciably progressed from the previous CT. No new  sites of bone erosion. 2. Circumferential subcutaneous edema and fluid, which may represent cellulitis. No organized or rim enhancing fluid collections. Electronically Signed   By: NDavina PokeD.O.   On: 08/04/2022 12:32   [place]NAL  Result Date: 07/19/2022 CLINICAL DATA:  3A CKD EXAM: RENAL / URINARY TRACT ULTRASOUND COMPLETE COMPARISON:  October 05, 2018 FINDINGS: Right Kidney: Renal measurements: 8.9 x 4.4 x 4.4 cm = volume: 89 mL. Echogenicity within normal limits. No mass or hydronephrosis visualized. Left Kidney: Renal measurements: 9.8 x 4.9 x 4.5 cm = volume: 113 mL. Echogenicity within normal limits. No mass or hydronephrosis visualized. Bladder: Appears normal for degree of bladder distention. Other: None. IMPRESSION: No hydronephrosis. Electronically Signed   By: SValentino SaxonM.D.   On: 07/19/2022 17:58    EKG: Normal sinus rhythm left axis deviation right bundle branch block  Weights: Filed Weights   08/09/22 1433  Weight: 64.4 kg     Physical Exam: Blood pressure (!) 194/77, pulse 75, temperature 97.7 F (36.5 C), temperature source Oral, resp. rate 19, weight 64.4 kg, SpO2 100 %. Body mass index is 28.68 kg/m. General: Well developed, well nourished, in no acute distress. Head eyes ears nose throat: Normocephalic, atraumatic, sclera non-icteric, no xanthomas, nares are without discharge. No apparent thyromegaly and/or mass  Lungs: Normal respiratory effort.  no wheezes, few rales, no rhonchi.  Heart: RRR with normal S1 S2. no murmur gallop, no rub, PMI is normal size and placement, carotid upstroke normal without bruit, jugular venous pressure is normal Abdomen: Soft, non-tender, non-distended with normoactive bowel sounds. No hepatomegaly. No rebound/guarding. No obvious abdominal masses. Abdominal aorta is normal size without bruit Extremities: Trace edema. no cyanosis, no clubbing, no ulcers  Peripheral : 2+ bilateral upper extremity pulses, 2+  bilateral femoral  pulses, 2+ bilateral dorsal pedal pulse Neuro: Alert and oriented. No facial asymmetry. No focal deficit. Moves all extremities spontaneously. Musculoskeletal: Normal muscle tone without kyphosis Psych:  Responds to questions appropriately with a normal affect.    Assessment: 88 year old female with coronary artery disease hypertension hyperlipidemia chronic kidney disease anemia with acute on chronic diastolic dysfunction congestive heart failure multifactorial in nature improved dramatically with current intravenous Lasix without evidence of myocardial infarction  Plan: 1.  Continue intravenous Lasix this a.m. for further urine output 2.  Continuation of angiotensin receptor blocker for hypertension control and consideration of adding low-dose beta-blocker for acute diastolic dysfunction heart failure 3.  No further cardiac diagnostics necessary at this time 4.  If ambulating well patient improved with resolution of symptoms may be able to discharged home with further outpatient treatment option and medication management changes  Signed, Corey Skains M.D. [facility] 08/10/2022, 7:54 AM

## 2022-08-10 NOTE — Assessment & Plan Note (Signed)
Potassium supplementation given.

## 2022-08-10 NOTE — ED Notes (Signed)
Pt sitting up in bed, home O2 delivered, sig other at bedside, states that they are ready to go home, pt verbalized understanding d/c instructions and follow up, pt from dpt via wheel chair.

## 2022-08-10 NOTE — Assessment & Plan Note (Signed)
Associated acute heart failure . Managed with hydralazine IV and continuation of home ARB in addition to added metoprolol. Resolved prior to discharge. Discharge with home telmisartan and new metoprolol prescription.

## 2022-08-10 NOTE — ED Notes (Signed)
Pt ambulated down the hallway on RA. Pt maintained sats of 81%-95% while walking. Pt denies any SHOB or pain while walking. Pt able to speak in full sentences with no distress noted while walking. Pt states she feels no different when she's on oxygen and when she's off oxygen.  Pt placed  back on 2L and back in bed. New purewick placed as well.

## 2022-08-10 NOTE — ED Notes (Addendum)
SATURATION QUALIFICATIONS: (This note is used to comply with regulatory documentation for home oxygen)  Patient Saturations on Room Air at Rest = 96%  Patient Saturations on Room Air while Ambulating = 83%  Patient Saturations on 2 Liters of oxygen while Ambulating = 89%  Please briefly explain why patient needs home oxygen: pt has a slight increase in RR when ambulating, pt states that she feeling better today than yesterday

## 2022-08-10 NOTE — Discharge Instructions (Signed)
Cassandra [name],  You were in the hospital with acute heart failure. This may have been to some dietary decisions, as we discussed. I recommend significantly limiting your intake of salt. At the very least, do not add salt to your meals. When choosing meals, please read the food labels so that you do not exceed a DAILY TOTAL of 2,000 mg of sodium per day. Please also limit your TOTAL FLUID INTAKE to no more than a little less than half a gallon (1.5-2 L). Please follow-up with your PCP and cardiologist.

## 2022-08-10 NOTE — ED Notes (Signed)
Pt ambulatory on 2L via [place], with a low O2 sat of 88%, pt was ambulatory with ed tech, per ed tech pt had a low O2 sat of 81% on room air.

## 2022-08-10 NOTE — Discharge Summary (Addendum)
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 *  Hospital Course: Cassandra [name] is a 88 y.o. female with a history of CAD s/p PCI, symptomatic bradycardia s/p PPM, CKD stage IV, diabetes mellitus, hypothyroidism, hypertension, COPD, OSA on CPAP. Patient presented secondary to shortness of breath and found to have evidence of acute heart failure with associated hypertensive emergency. Patient started on IV Lasix and IV hydralazine with improvement in symptoms. Patient required oxygen and qualified for home oxygen prior to discharge. Cardiology consulted and recommended discharge home with addition of metoprolol to home regimen.  Assessment and Plan: * Acute on chronic diastolic CHF (congestive heart failure) (HCC) Dyspnea with associated interstitial edema and elevated BNP of 1,302. Likely secondary to dietary indiscretion. Cardiology consulted. Patient started on IV diuresis with quick improvement in symptoms. Cardiology recommending new prescription of metoprolol on discharge. Patient ambulated well with baseline functional level. Patient to  follow-up with PCP and cardiology as an outpatient.  CAD S/P percutaneous coronary angioplasty Noted. Continue Crestor and telmisartan.  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  COPD (chronic obstructive pulmonary disease) (HCC) No evidence of acute exacerbation. Stable. Continue home regimen.  Acquired hypothyroidism Continue levothyroxine.  Insulin-requiring or dependent type II diabetes mellitus ([place]) Continue home regimen except metformin is discontinued secondary to renal function.  Hypokalemia Potassium supplementation given.  Hypertensive emergency Associated acute heart failure . Managed with hydralazine IV and continuation of home ARB in addition to added metoprolol. Resolved prior to discharge. Discharge with home telmisartan and new metoprolol prescription.  CKD (chronic kidney disease) stage 4, GFR 15-29 ml/min (HCC) Stable.  Acute respiratory failure with hypoxia (HCC) Secondary to acute heart failure, although patient also states she has been trying to get oxygen as an outpatient. Likely component of oxygen requirement from underlying COPD. Patient qualified for oxygen on discharge.    Consultants: Cardiology Procedures performed: None  Disposition: Home Diet recommendation: Cardiac and Carb modified diet  DISCHARGE MEDICATION: Allergies as of 08/10/2022       Reactions   Baclofen Other (See Comments)   Hallucinations   Levofloxacin Other (See Comments)   Hallucinations         Medication List     STOP taking these medications    doxycycline 100 MG tablet Commonly known as: VIBRA-TABS   metFORMIN 500 MG tablet Commonly known as: GLUCOPHAGE   rosuvastatin 10 MG tablet Commonly known as: CRESTOR       TAKE these medications    acetaminophen 500 MG tablet Commonly known as: TYLENOL Take 500-1,000 mg by mouth every 6 (six) hours as needed for moderate pain.   albuterol 108 (90 Base) MCG/ACT inhaler Commonly known as:  VENTOLIN HFA Inhale 2 puffs into the lungs every 6 (six) hours as needed for wheezing or shortness of breath.   amikacin 1 GM/4ML Soln injection Commonly known as: AMIKIN   amitriptyline 10 MG tablet Commonly known as: ELAVIL Take 1 tablet by mouth at bedtime.   cefOXitin 2 g injection Commonly known as: MEFOXIN Inject 2 g into the vein every 6 (six) hours.   CLOFAZIMINE PO Take 100 mg by mouth daily.   desvenlafaxine 50 MG 24 hr tablet Commonly known as: PRISTIQ Take 50 mg by mouth daily.   furosemide 20 MG tablet Commonly known as: LASIX Take 20 mg by mouth daily.   Lantus SoloStar 100 UNIT/ML Solostar Pen Generic drug: insulin glargine Inject 8 Units into the skin at bedtime.   levothyroxine 100 MCG tablet Commonly known as: SYNTHROID Take 100 mcg by mouth every morning.   linezolid 600 MG tablet Commonly known as: ZYVOX Take 1 tablet (600 mg total) by mouth daily.   metoCLOPramide 5 MG tablet Commonly known as: REGLAN Take 5 mg by mouth 2 (two) times daily.   metoprolol tartrate 25 MG tablet Commonly known as: LOPRESSOR Take 1 tablet (25 mg total) by mouth 2 (two) times daily.   multivitamin with minerals Tabs tablet Take 1 tablet by mouth daily.   nitroGLYCERIN 0.4 MG SL tablet Commonly known as: NITROSTAT Place 1 tablet (0.4 mg total) under the tongue every 5 (five) minutes as needed for chest pain.   NovoLOG FlexPen 100 UNIT/ML FlexPen Generic drug: insulin aspart Inject 4 Units into the skin daily.   telmisartan 40 MG tablet Commonly known as: MICARDIS Take 40 mg by mouth daily.   traZODone 50 MG tablet Commonly known as: DESYREL Take 50 mg by mouth at bedtime.   VITAMIN D3 PO Take 1 capsule by mouth daily.               Durable Medical Equipment  (From admission, onward)           Start     Ordered   08/10/22 1220  For home use only DME oxygen  Once       Question Answer Comment  Length of Need Lifetime   Mode or (Route)  Nasal cannula   Liters per Minute 2   Frequency Continuous (stationary and portable oxygen unit needed)   Oxygen delivery system Gas      08/10/22 1219            Follow-up Information     Idelle Crouch, MD. Schedule an appointment as soon as possible for a visit in 1 week(s).   Specialty: Internal Medicine Why: For hospital follow-up Contact information: [place]  [address] [phone]                Discharge Exam: BP (!) 157/58 (BP Location: Left Arm)   Pulse 67   Temp 97.9 F (36.6 C) (Oral)   Resp 17   Wt 64.4 kg   LMP  (LMP Unknown) Comment: age 71  SpO2 95%   BMI 28.68 kg/m   General exam: Appears calm and comfortable Respiratory system: Clear to auscultation. Respiratory effort normal. Cardiovascular system: S1 & S2 heard, RRR. Gastrointestinal system: Abdomen is nondistended, soft and nontender. No organomegaly or masses felt. Normal bowel sounds heard. Central nervous system: Alert and oriented. No focal neurological deficits. Musculoskeletal: BLE 1+ pitting edema. No calf tenderness Skin: No cyanosis. No rashes Psychiatry: Judgement and insight  appear normal. Mood & affect appropriate.   Condition at discharge: stable  The results of significant diagnostics from this hospitalization (including imaging, microbiology, ancillary and laboratory) are listed below for reference.   Imaging Studies: DG Chest 2 View  Result Date: 08/09/2022 CLINICAL DATA:  Short of breath, hypoxia EXAM: CHEST - 2 VIEW COMPARISON:  06/02/2022 FINDINGS: Frontal and lateral views of the chest demonstrates stable dual lead pacemaker. Right internal jugular catheter tip overlies superior vena cava. The cardiac silhouette is enlarged. There is increased interstitial prominence since prior study, with trace bilateral pleural effusions. No focal consolidation or pneumothorax. No acute bony abnormalities. IMPRESSION: 1. Findings  consistent with mild congestive heart failure and interstitial edema. Electronically Signed   By: Randa Ngo M.D.   On: 08/09/2022 15:22   CT TIBIA FIBULA LEFT W CONTRAST  Result Date: 08/04/2022 CLINICAL DATA:  Follow-up osteomyelitis EXAM: CT OF THE LOWER LEFT EXTREMITY WITH CONTRAST TECHNIQUE: Multidetector CT imaging of the lower left extremity was performed according to the standard protocol following intravenous contrast administration. RADIATION DOSE REDUCTION: This exam was performed according to the departmental dose-optimization program which includes automated exposure control, adjustment of the mA and/or kV according to patient size and/or use of iterative reconstruction technique. CONTRAST:  33m OMNIPAQUE IOHEXOL 300 MG/ML  SOLN COMPARISON:  X-ray 05/03/2022, CT 04/12/2022 FINDINGS: Bones/Joint/Cartilage Diffuse osseous demineralization. Since the previous CT, there has been interval placement of antibiotic cement spacer at the distal tibial metaphysis and medial malleolus. Increasing sclerosis within the distal tibia adjacent to the antibiotic spacer. The degree of bone loss of the distal tibia and talar dome and talar neck has not appreciably progressed from the previous CT. No new sites of bone erosion. Posttraumatic and postsurgical changes of the distal tibia and fibula. No acute fracture or dislocation. Alignment of the hindfoot and midfoot are maintained. Similar degenerative changes of the knee and foot. Several loose bodies within a Baker's cyst. Ligaments Suboptimally assessed by CT. Muscles and Tendons Generalized muscle atrophy. No intramuscular fluid collection. Tendinous structures remain grossly intact. Soft tissues There appears to be developing heterotopic calcification along the anterolateral margin of the distal tibia. Circumferential subcutaneous edema and fluid. No organized or rim enhancing fluid collections. No soft tissue gas. IMPRESSION: 1. Since the previous CT, there  has been interval placement of antibiotic cement spacer at the distal tibial metaphysis and medial malleolus. Increasing sclerosis within the distal tibia adjacent to the antibiotic spacer. The degree of bone loss of the distal tibia and talar dome and talar neck has not appreciably progressed from the previous CT. No new sites of bone erosion. 2. Circumferential subcutaneous edema and fluid, which may represent cellulitis. No organized or rim enhancing fluid collections. Electronically Signed   By: NDavina PokeD.O.   On: 08/04/2022 12:32   [place]NAL  Result Date: 07/19/2022 CLINICAL DATA:  3A CKD EXAM: RENAL / URINARY TRACT ULTRASOUND COMPLETE COMPARISON:  October 05, 2018 FINDINGS: Right Kidney: Renal measurements: 8.9 x 4.4 x 4.4 cm = volume: 89 mL. Echogenicity within normal limits. No mass or hydronephrosis visualized. Left Kidney: Renal measurements: 9.8 x 4.9 x 4.5 cm = volume: 113 mL. Echogenicity within normal limits. No mass or hydronephrosis visualized. Bladder: Appears normal for degree of bladder distention. Other: None. IMPRESSION: No hydronephrosis. Electronically Signed   By: SValentino SaxonM.D.   On: 07/19/2022 17:58    Microbiology: Results for orders placed or performed during the hospital encounter of 08/09/22  Resp Panel  by RT-PCR (Flu A&B, Covid) Anterior Nasal Swab     Status: None   Collection Time: 08/09/22  8:00 PM   Specimen: Anterior Nasal Swab  Result Value Ref Range Status   SARS Coronavirus 2 by RT PCR NEGATIVE NEGATIVE Final    Comment: (NOTE) SARS-CoV-2 target nucleic acids are NOT DETECTED.  The SARS-CoV-2 RNA is generally detectable in upper respiratory specimens during the acute phase of infection. The lowest concentration of SARS-CoV-2 viral copies this assay can detect is 138 copies/mL. A negative result does not preclude SARS-Cov-2 infection and should not be used as the sole basis for treatment or other patient management decisions. A negative  result may occur with  improper specimen collection/handling, submission of specimen other than nasopharyngeal swab, presence of viral mutation(s) within the areas targeted by this assay, and inadequate number of viral copies(<138 copies/mL). A negative result must be combined with clinical observations, patient history, and epidemiological information. The expected result is Negative.  Fact Sheet for Patients:  EntrepreneurPulse.com.au  Fact Sheet for Healthcare Providers:  IncredibleEmployment.be  This test is no t yet approved or cleared by the [place] FDA and  has been authorized for detection and/or diagnosis of SARS-CoV-2 by FDA under an Emergency Use Authorization (EUA). This EUA will remain  in effect (meaning this test can be used) for the duration of the COVID-19 declaration under Section 564(b)(1) of the Act, 21 U.S.C.section 360bbb-3(b)(1), unless the authorization is terminated  or revoked sooner.       Influenza A by PCR NEGATIVE NEGATIVE Final   Influenza B by PCR NEGATIVE NEGATIVE Final    Comment: (NOTE) The Xpert Xpress SARS-CoV-2/FLU/RSV plus assay is intended as an aid in the diagnosis of influenza from Nasopharyngeal swab specimens and should not be used as a sole basis for treatment. Nasal washings and aspirates are unacceptable for Xpert Xpress SARS-CoV-2/FLU/RSV testing.  Fact Sheet for Patients: EntrepreneurPulse.com.au  Fact Sheet for Healthcare Providers: IncredibleEmployment.be  This test is not yet approved or cleared by the [place] FDA and has been authorized for detection and/or diagnosis of SARS-CoV-2 by FDA under an Emergency Use Authorization (EUA). This EUA will remain in effect (meaning this test can be used) for the duration of the COVID-19 declaration under Section 564(b)(1) of the Act, 21 U.S.C. section 360bbb-3(b)(1), unless the authorization is  terminated or revoked.  Performed at [facility], [address] [place], [place] [address]     Labs: CBC: Recent Labs  Lab 08/09/22 1436  WBC 8.8  NEUTROABS 5.7  HGB 9.4*  HCT 32.7*  MCV 98.5  PLT 191   Basic Metabolic Panel: Recent Labs  Lab 08/09/22 1436 08/10/22 0418  NA 139 142  K 4.2 3.4*  CL 112* 106  CO2 19* 29  GLUCOSE 167* 109*  BUN 17 18  CREATININE 2.13*  2.06* 1.95*  CALCIUM 8.1* 8.5*    CBG: Recent Labs  Lab 08/09/22 2227 08/10/22 0709 08/10/22 1118  GLUCAP 171* 101* 136*    Discharge time spent: 35 minutes.  Signed: Cordelia Poche, MD [facility] 08/10/2022

## 2022-08-13 ENCOUNTER — Ambulatory Visit: Payer: Medicare Other

## 2022-08-16 ENCOUNTER — Ambulatory Visit
Admission: RE | Admit: 2022-08-16 | Discharge: 2022-08-16 | Disposition: A | Discharge status: Home / Self Care | Payer: Medicare Other | Source: Ambulatory Visit | Attending: Gastroenterology | Admitting: Gastroenterology

## 2022-08-16 DIAGNOSIS — R933 Abnormal findings on diagnostic imaging of other parts of digestive tract: Secondary | ICD-10-CM

## 2022-08-16 DIAGNOSIS — R1312 Dysphagia, oropharyngeal phase: Secondary | ICD-10-CM | POA: Insufficient documentation

## 2022-08-16 NOTE — Progress Notes (Signed)
Modified Barium Swallow Progress Note  Patient Details  Name: Cassandra [name] MRN: [id number] Date of Birth: [birth year]  Today's Date: 08/16/2022  Modified Barium Swallow completed.  Full report located under Chart Review in the Imaging Section.  Brief recommendations include the following:  Clinical Impression  During today's study, pt presents with mild pharyngeal dysphagia c/b decreased glottal closure when consuming thin liquids and nectar thick liquids. When consuming thin liquids via single cup sips, pt has trace penetration that is cleared with swallow. However, when consuming larger cup sips and sips via straw, pt with deep penetration that doesn't clear with swallow. The penetrates are not visualized at the level of the cords but do increase pt's risk of aspiration. As mentioned, pt with decreased gloattal closure with nectar thick liquids as well. As such, recommend pt consume thin liquids via single small cup sips to reduce her risk of aspiration. This education was provided to pt as well as her husband. Both voiced understanding of risk of potential aspiration as well as recommendation to stop using a straw. At this time, further ST services are not indicated.   Swallow Evaluation Recommendations       SLP Diet Recommendations: Regular solids;Thin liquid   Liquid Administration via: Cup   Medication Administration: Whole meds with puree   Supervision: Patient able to self feed   Compensations: Minimize environmental distractions;Slow rate;Small sips/bites   Postural Changes: Seated upright at 90 degrees   Oral Care Recommendations: Oral care BID      Johan Antonacci B. Rutherford Nail, M.S., CCC-SLP, Mining engineer Certified [facility] [place]  [facility] Office [phone] Ascom [phone] Fax [phone]

## 2022-08-19 ENCOUNTER — Emergency Department: Payer: Medicare Other

## 2022-08-19 ENCOUNTER — Other Ambulatory Visit: Payer: Self-pay

## 2022-08-19 ENCOUNTER — Observation Stay
Admission: EM | Admit: 2022-08-19 | Discharge: 2022-08-21 | Disposition: A | Discharge status: Home Health | Payer: Medicare Other | Attending: Hospitalist | Admitting: Hospitalist

## 2022-08-19 DIAGNOSIS — I480 Paroxysmal atrial fibrillation: Secondary | ICD-10-CM | POA: Diagnosis not present

## 2022-08-19 DIAGNOSIS — I5032 Chronic diastolic (congestive) heart failure: Secondary | ICD-10-CM | POA: Insufficient documentation

## 2022-08-19 DIAGNOSIS — T827XXA Infection and inflammatory reaction due to other cardiac and vascular devices, implants and grafts, initial encounter: Secondary | ICD-10-CM | POA: Insufficient documentation

## 2022-08-19 DIAGNOSIS — J449 Chronic obstructive pulmonary disease, unspecified: Secondary | ICD-10-CM | POA: Diagnosis present

## 2022-08-19 DIAGNOSIS — T847XXD Infection and inflammatory reaction due to other internal orthopedic prosthetic devices, implants and grafts, subsequent encounter: Secondary | ICD-10-CM

## 2022-08-19 DIAGNOSIS — E039 Hypothyroidism, unspecified: Secondary | ICD-10-CM | POA: Insufficient documentation

## 2022-08-19 DIAGNOSIS — M86672 Other chronic osteomyelitis, left ankle and foot: Secondary | ICD-10-CM | POA: Diagnosis not present

## 2022-08-19 DIAGNOSIS — Z950 Presence of cardiac pacemaker: Secondary | ICD-10-CM | POA: Diagnosis present

## 2022-08-19 DIAGNOSIS — Y712 Prosthetic and other implants, materials and accessory cardiovascular devices associated with adverse incidents: Secondary | ICD-10-CM | POA: Diagnosis not present

## 2022-08-19 DIAGNOSIS — Z87891 Personal history of nicotine dependence: Secondary | ICD-10-CM | POA: Insufficient documentation

## 2022-08-19 DIAGNOSIS — I495 Sick sinus syndrome: Secondary | ICD-10-CM | POA: Diagnosis present

## 2022-08-19 DIAGNOSIS — I700 Atherosclerosis of aorta: Secondary | ICD-10-CM | POA: Diagnosis not present

## 2022-08-19 DIAGNOSIS — N184 Chronic kidney disease, stage 4 (severe): Secondary | ICD-10-CM | POA: Diagnosis present

## 2022-08-19 DIAGNOSIS — N179 Acute kidney failure, unspecified: Secondary | ICD-10-CM | POA: Diagnosis not present

## 2022-08-19 DIAGNOSIS — Z794 Long term (current) use of insulin: Secondary | ICD-10-CM | POA: Insufficient documentation

## 2022-08-19 DIAGNOSIS — I2510 Atherosclerotic heart disease of native coronary artery without angina pectoris: Secondary | ICD-10-CM | POA: Diagnosis present

## 2022-08-19 DIAGNOSIS — I130 Hypertensive heart and chronic kidney disease with heart failure and stage 1 through stage 4 chronic kidney disease, or unspecified chronic kidney disease: Secondary | ICD-10-CM | POA: Insufficient documentation

## 2022-08-19 DIAGNOSIS — J439 Emphysema, unspecified: Secondary | ICD-10-CM

## 2022-08-19 DIAGNOSIS — R2990 Unspecified symptoms and signs involving the nervous system: Secondary | ICD-10-CM | POA: Diagnosis not present

## 2022-08-19 DIAGNOSIS — E1122 Type 2 diabetes mellitus with diabetic chronic kidney disease: Secondary | ICD-10-CM | POA: Insufficient documentation

## 2022-08-19 DIAGNOSIS — R531 Weakness: Secondary | ICD-10-CM

## 2022-08-19 DIAGNOSIS — R29818 Other symptoms and signs involving the nervous system: Secondary | ICD-10-CM | POA: Diagnosis not present

## 2022-08-19 DIAGNOSIS — Z853 Personal history of malignant neoplasm of breast: Secondary | ICD-10-CM | POA: Insufficient documentation

## 2022-08-19 DIAGNOSIS — R29898 Other symptoms and signs involving the musculoskeletal system: Secondary | ICD-10-CM

## 2022-08-19 DIAGNOSIS — Z79899 Other long term (current) drug therapy: Secondary | ICD-10-CM | POA: Insufficient documentation

## 2022-08-19 DIAGNOSIS — Z955 Presence of coronary angioplasty implant and graft: Secondary | ICD-10-CM | POA: Diagnosis not present

## 2022-08-19 DIAGNOSIS — I6523 Occlusion and stenosis of bilateral carotid arteries: Secondary | ICD-10-CM | POA: Diagnosis not present

## 2022-08-19 DIAGNOSIS — R410 Disorientation, unspecified: Secondary | ICD-10-CM | POA: Insufficient documentation

## 2022-08-19 DIAGNOSIS — Z85828 Personal history of other malignant neoplasm of skin: Secondary | ICD-10-CM | POA: Insufficient documentation

## 2022-08-19 DIAGNOSIS — I5042 Chronic combined systolic (congestive) and diastolic (congestive) heart failure: Secondary | ICD-10-CM | POA: Diagnosis present

## 2022-08-19 DIAGNOSIS — R414 Neurologic neglect syndrome: Secondary | ICD-10-CM | POA: Diagnosis not present

## 2022-08-19 DIAGNOSIS — I639 Cerebral infarction, unspecified: Secondary | ICD-10-CM | POA: Diagnosis not present

## 2022-08-19 DIAGNOSIS — T847XXA Infection and inflammatory reaction due to other internal orthopedic prosthetic devices, implants and grafts, initial encounter: Secondary | ICD-10-CM | POA: Diagnosis present

## 2022-08-19 DIAGNOSIS — I160 Hypertensive urgency: Secondary | ICD-10-CM | POA: Diagnosis present

## 2022-08-19 DIAGNOSIS — R262 Difficulty in walking, not elsewhere classified: Secondary | ICD-10-CM | POA: Diagnosis not present

## 2022-08-19 DIAGNOSIS — E119 Type 2 diabetes mellitus without complications: Secondary | ICD-10-CM

## 2022-08-19 DIAGNOSIS — I708 Atherosclerosis of other arteries: Secondary | ICD-10-CM | POA: Diagnosis not present

## 2022-08-19 LAB — COMPREHENSIVE METABOLIC PANEL
ALT: 29 U/L (ref 0–44)
AST: 41 U/L (ref 15–41)
Albumin: 2.6 g/dL — ABNORMAL LOW (ref 3.5–5.0)
Alkaline Phosphatase: 129 U/L — ABNORMAL HIGH (ref 38–126)
Anion gap: 11 (ref 5–15)
BUN: 25 mg/dL — ABNORMAL HIGH (ref 8–23)
CO2: 22 mmol/L (ref 22–32)
Calcium: 8.8 mg/dL — ABNORMAL LOW (ref 8.9–10.3)
Chloride: 106 mmol/L (ref 98–111)
Creatinine, Ser: 2.39 mg/dL — ABNORMAL HIGH (ref 0.44–1.00)
GFR, Estimated: 19 mL/min — ABNORMAL LOW (ref >60)
Glucose, Bld: 113 mg/dL — ABNORMAL HIGH (ref 70–99)
Potassium: 4.2 mmol/L (ref 3.5–5.1)
Sodium: 139 mmol/L (ref 135–145)
Total Bilirubin: 0.6 mg/dL (ref 0.3–1.2)
Total Protein: 7 g/dL (ref 6.5–8.1)

## 2022-08-19 LAB — CBG MONITORING, ED
Glucose-Capillary: 105 mg/dL — ABNORMAL HIGH (ref 70–99)
Glucose-Capillary: 106 mg/dL — ABNORMAL HIGH (ref 70–99)
Glucose-Capillary: 90 mg/dL (ref 70–99)
Glucose-Capillary: 95 mg/dL (ref 70–99)

## 2022-08-19 LAB — ETHANOL: Alcohol, Ethyl (B): <10 mg/dL (ref <10)

## 2022-08-19 LAB — APTT: aPTT: 32 seconds (ref 24–36)

## 2022-08-19 LAB — PROTIME-INR
INR: 1.2 (ref 0.8–1.2)
Prothrombin Time: 14.8 seconds (ref 11.4–15.2)

## 2022-08-19 LAB — CBC
HCT: 31.4 % — ABNORMAL LOW (ref 36.0–46.0)
Hemoglobin: 9.6 g/dL — ABNORMAL LOW (ref 12.0–15.0)
MCH: 28.6 pg (ref 26.0–34.0)
MCHC: 30.6 g/dL (ref 30.0–36.0)
MCV: 93.5 fL (ref 80.0–100.0)
Platelets: 233 10*3/uL (ref 150–400)
RBC: 3.36 MIL/uL — ABNORMAL LOW (ref 3.87–5.11)
RDW: 19.6 % — ABNORMAL HIGH (ref 11.5–15.5)
WBC: 8.4 10*3/uL (ref 4.0–10.5)
nRBC: 0 % (ref 0.0–0.2)

## 2022-08-19 LAB — DIFFERENTIAL
Abs Immature Granulocytes: 0.04 10*3/uL (ref 0.00–0.07)
Basophils Absolute: 0.1 10*3/uL (ref 0.0–0.1)
Basophils Relative: 1 %
Eosinophils Absolute: 0.3 10*3/uL (ref 0.0–0.5)
Eosinophils Relative: 4 %
Immature Granulocytes: 1 %
Lymphocytes Relative: 16 %
Lymphs Abs: 1.4 10*3/uL (ref 0.7–4.0)
Monocytes Absolute: 0.8 10*3/uL (ref 0.1–1.0)
Monocytes Relative: 9 %
Neutro Abs: 5.8 10*3/uL (ref 1.7–7.7)
Neutrophils Relative %: 69 %

## 2022-08-19 MED ORDER — LABETALOL HCL 5 MG/ML IV SOLN: 5.0000 mg | INTRAVENOUS | Status: DC | PRN

## 2022-08-19 MED ORDER — CEFOXITIN SODIUM 2 G IV SOLR: 2.0000 g | Freq: Two times a day (BID) | INTRAVENOUS | Status: DC

## 2022-08-19 MED ORDER — LINEZOLID 600 MG/300ML IV SOLN
600.0000 mg | Freq: Every day | INTRAVENOUS | Status: DC
  Administered 2022-08-20: 600 mg via INTRAVENOUS
  Filled 2022-08-19 (×2): qty 300

## 2022-08-19 MED ORDER — DEXTROSE 5 % IV SOLN
800.0000 mg | INTRAVENOUS | Status: DC
  Administered 2022-08-20: 800 mg via INTRAVENOUS
  Filled 2022-08-19: qty 3.2

## 2022-08-19 MED ORDER — INSULIN ASPART 100 UNIT/ML IJ SOLN
0.0000 [IU] | INTRAMUSCULAR | Status: DC
  Administered 2022-08-20: 1 [IU] via SUBCUTANEOUS
  Filled 2022-08-19: qty 1

## 2022-08-19 MED ORDER — ASPIRIN 81 MG PO TBEC
81.0000 mg | DELAYED_RELEASE_TABLET | Freq: Every day | ORAL | Status: DC
  Administered 2022-08-20: 81 mg via ORAL
  Filled 2022-08-19: qty 1

## 2022-08-19 MED ORDER — ONDANSETRON HCL 4 MG/2ML IJ SOLN: 4.0000 mg | Freq: Four times a day (QID) | INTRAMUSCULAR | Status: DC | PRN

## 2022-08-19 MED ORDER — ONDANSETRON HCL 4 MG PO TABS: 4.0000 mg | ORAL_TABLET | Freq: Four times a day (QID) | ORAL | Status: DC | PRN

## 2022-08-19 MED ORDER — HEPARIN SODIUM (PORCINE) 5000 UNIT/ML IJ SOLN
5000.0000 [IU] | Freq: Three times a day (TID) | INTRAMUSCULAR | Status: DC
  Administered 2022-08-19 – 2022-08-21 (×5): 5000 [IU] via SUBCUTANEOUS
  Filled 2022-08-19 (×5): qty 1

## 2022-08-19 MED ORDER — SODIUM CHLORIDE 0.9 % IV SOLN
2.0000 g | Freq: Two times a day (BID) | INTRAVENOUS | Status: DC
  Administered 2022-08-19 – 2022-08-21 (×4): 2 g via INTRAVENOUS
  Filled 2022-08-19 (×4): qty 2

## 2022-08-19 MED ORDER — AMIKACIN SULFATE 1 GM/4ML IJ SOLN: 800.0000 mg | INTRAMUSCULAR | Status: DC

## 2022-08-19 MED ORDER — IOHEXOL 350 MG/ML SOLN
75.0000 mL | Freq: Once | INTRAVENOUS | Status: AC | PRN
  Administered 2022-08-19: 75 mL via INTRAVENOUS

## 2022-08-19 MED ORDER — CLOPIDOGREL BISULFATE 75 MG PO TABS
75.0000 mg | ORAL_TABLET | Freq: Every day | ORAL | Status: DC
  Administered 2022-08-20 – 2022-08-21 (×2): 75 mg via ORAL
  Filled 2022-08-19 (×3): qty 1

## 2022-08-19 MED ORDER — SODIUM CHLORIDE 0.9% FLUSH: 3.0000 mL | Freq: Once | INTRAVENOUS | Status: DC

## 2022-08-19 MED ORDER — ASPIRIN 81 MG PO TBEC: 81.0000 mg | DELAYED_RELEASE_TABLET | Freq: Every day | ORAL | Status: DC

## 2022-08-19 MED ORDER — ASPIRIN 300 MG RE SUPP
300.0000 mg | Freq: Once | RECTAL | Status: AC
  Administered 2022-08-19: 300 mg via RECTAL
  Filled 2022-08-19: qty 1

## 2022-08-19 MED ORDER — STROKE: EARLY STAGES OF RECOVERY BOOK: Freq: Once | Status: AC

## 2022-08-19 MED ORDER — ASPIRIN 325 MG PO TABS
325.0000 mg | ORAL_TABLET | Freq: Once | ORAL | Status: DC
  Filled 2022-08-19: qty 1

## 2022-08-19 NOTE — ED Notes (Signed)
Pt states uses 2L O2 at home as needed during day and 2L always at night. Pt denies CP and HA and confirms breathing is at her baseline. Pt states catheter at R chest being used for tx of rare condition.

## 2022-08-19 NOTE — Code Documentation (Signed)
Stroke Response Nurse Documentation Code Documentation  Cassandra [name] is a 88 y.o. female arriving to [facility] via [facility] on 08/19/2022 with past medical hx of DM, CAD, carotid artery calcification, hypercholesterolemia, sleep apnea, breast cancer with right mastectomy, cardiac cath (2017), neuromuscular discorder, MI, dysrhythmia, hypothyroidism, cardiac pacemaker. On no antithrombotic, per patient and husband. Code stroke was activated by EMS.   Patient from home where she was LKW at 0930 and now complaining of altered mental status. Per EMS, patient is currently receiving treatments on her left foot/ankle. Home health nurse came to patient's house today for treatment and then patient laid down to rest. When patient woke up, patient was noted to be lethargic and right arm flaccid and EMS was called.  Stroke team at the bedside on patient arrival. Patient to CT with team. NIHSS 5, see documentation for details and code stroke times. Patient with disoriented, bilateral arm weakness, and Visual  neglect on exam. The following imaging was completed:  CT Head, CTA, and CTP. Patient is not a candidate for IV Thrombolytic due to outside window. Patient is not a candidate for IR due to no LVO seen on imaging, per MD.   Care Plan: Q2h NIHSS/vital signs, stroke swallow screen per order.   Bedside handoff with ED RN Caryl Pina.    Charise Carwin  Stroke Response RN

## 2022-08-19 NOTE — ED Provider Notes (Signed)
[facility] Provider Note   Event Date/Time   First MD Initiated Contact with Patient 08/19/22 1459     (approximate) History  Code Stroke  HPI Cassandra [name] is a 88 y.o. female with a past medical history of COPD, CAD, hypertension, carotid stenosis, and type 2 diabetes who presents for generalized weakness and inability to ambulate that began at 930 this morning.  Patient states that the symptoms came on spontaneously and have been stable since onset but may have somewhat improved.  Patient now complaining of bilateral upper extremity weakness and disorientation. ROS: Patient currently denies any vision changes, tinnitus, difficulty speaking, facial droop, sore throat, chest pain, shortness of breath, abdominal pain, nausea/vomiting/diarrhea, dysuria, or numbness/paresthesias in any extremity   Physical Exam  Triage Vital Signs: ED Triage Vitals  Enc Vitals Group     BP      Pulse      Resp      Temp      Temp src      SpO2      Weight      Height      Head Circumference      Peak Flow      Pain Score      Pain Loc      Pain Edu?      Excl. in [place]?    Most recent vital signs: Vitals:   08/19/22 1425 08/19/22 1600  BP: (!) 170/90 (!) 194/114  Pulse:  72  Resp:  19  SpO2:  99%   General: Awake, oriented x4. CV:  Good peripheral perfusion.  Resp:  Normal effort.  Abd:  No distention.  Other:  Elderly Caucasian female laying in bed in no acute distress.  Visual neglect, bilateral upper extremity weakness, and disorientation.  NIHSS 5 ED Results / Procedures / Treatments  Labs (all labs ordered are listed, but only abnormal results are displayed) Labs Reviewed  CBC - Abnormal; Notable for the following components:      Result Value   RBC 3.36 (*)    Hemoglobin 9.6 (*)    HCT 31.4 (*)    RDW 19.6 (*)    All other components within normal limits  COMPREHENSIVE METABOLIC PANEL - Abnormal; Notable for the following components:   Glucose,  Bld 113 (*)    BUN 25 (*)    Creatinine, Ser 2.39 (*)    Calcium 8.8 (*)    Albumin 2.6 (*)    Alkaline Phosphatase 129 (*)    GFR, Estimated 19 (*)    All other components within normal limits  PROTIME-INR  APTT  DIFFERENTIAL  ETHANOL  CBG MONITORING, ED  I-STAT CREATININE, ED   EKG ED ECG REPORT I, Naaman Plummer, the attending physician, personally viewed and interpreted this ECG. Date: 08/19/2022 EKG Time: 1513 Rate: 70 Rhythm: normal sinus rhythm QRS Axis: normal Intervals: Right bundle branch block/left anterior fascicular block ST/T Wave abnormalities: normal Narrative Interpretation: Normal sinus rhythm with right bundle branch block and left anterior fascicular block.  No evidence of acute ischemia RADIOLOGY ED MD interpretation: CT angiography of the brain interpreted independently by me shows no emergent large vessel occlusion and no infarct core identified.  Further incidental findings are listed below CT of the head without contrast interpreted by me shows no evidence of acute abnormalities including no intracerebral hemorrhage, obvious masses, or significant edema -Agree with radiology assessment Official radiology report(s): CT ANGIO HEAD NECK W WO CM W  PERF (CODE STROKE)  Result Date: 08/19/2022 CLINICAL DATA:  Weakness and difficulty walking. EXAM: CT ANGIOGRAPHY HEAD AND NECK CT PERFUSION BRAIN TECHNIQUE: Multidetector CT imaging of the head and neck was performed using the standard protocol during bolus administration of intravenous contrast. Multiplanar CT image reconstructions and MIPs were obtained to evaluate the vascular anatomy. Carotid stenosis measurements (when applicable) are obtained utilizing NASCET criteria, using the distal internal carotid diameter as the denominator. Multiphase CT imaging of the brain was performed following IV bolus contrast injection. Subsequent parametric perfusion maps were calculated using RAPID software. RADIATION DOSE  REDUCTION: This exam was performed according to the departmental dose-optimization program which includes automated exposure control, adjustment of the mA and/or kV according to patient size and/or use of iterative reconstruction technique. CONTRAST:  26m OMNIPAQUE IOHEXOL 350 MG/ML SOLN COMPARISON:  Same-day noncontrast CT head, carotid Doppler 06/03/2022, CT neck with contrast 04/23/2012. FINDINGS: CTA NECK FINDINGS Aortic arch: There is calcified plaque in the aortic arch and at the origins of the major branch vessels. There is severe stenosis of the proximal left subclavian artery. Right carotid system: There is moderate stenosis of the origin of the right common carotid artery. There is scattered calcified plaque throughout the remainder of the common carotid artery without hemodynamically significant stenosis or occlusion. There is bulky calcified plaque in the proximal internal carotid artery resulting in approximately 65-70% stenosis. The distal internal carotid artery is patent. The external carotid artery is patent. There is no dissection or aneurysm. Left carotid system: There is mixed plaque in the proximal left internal carotid artery resulting in less than 50% stenosis. The remainder of the common carotid artery is patent with scattered plaque but no hemodynamically significant stenosis. There is minimal calcified plaque in the proximal internal carotid artery and mild calcified plaque distally without hemodynamically significant stenosis. The external carotid artery is occluded. 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 There is no new dissection or aneurysm. Vertebral arteries: There is severe stenosis/occlusion of the proximal left internal carotid artery. The remainder of the left vertebral artery is diminutive in caliber but appears patent. The dominant right vertebral artery is patent without hemodynamically significant stenosis or occlusion. There is no  aneurysm or dissection. Skeleton: There is no acute osseous abnormality or suspicious osseous lesion. There is no visible canal hematoma. Other neck: The soft tissues of the neck are unremarkable. Upper chest: There is marked scarring and architectural distortion in the lung apices, similar to the prior CT chest. Review of the MIP images confirms the above findings CTA HEAD FINDINGS Anterior circulation: There is calcified plaque in the intracranial ICAs without greater than mild stenosis bilaterally. The bilateral MCAs are patent, without proximal stenosis or occlusion. The bilateral ACAs are patent, without proximal stenosis or occlusion. The right A1 segment is diminutive, likely a developmental variant. There is no aneurysm or AVM. Posterior circulation: The bilateral V4 segments are patent. The basilar artery is patent. The major cerebellar arteries are patent. There is a fetal origin of the right PCA. The right PCA is patent, without proximal stenosis or occlusion. There is also a predominantly fetal origin of the left PCA with moderate to severe stenosis at the PCOM/P2 junction (10-101). The remainder of the PCAs patent, without other proximal high-grade stenosis or occlusion There is no aneurysm or AVM. Venous sinuses: Suboptimally evaluated due to bolus timing but grossly patent. Anatomic variants: As above. Review of the MIP images confirms the above findings  CT Brain Perfusion Findings: ASPECTS: 10 CBF (<30%) Volume: 69m Perfusion (Tmax>6.0s) volume: 493mMismatch Volume: 4038mnfarction Location:No infarct core is identified. The penumbra described above is scattered in the bilateral cerebral and cerebellar hemispheres, favored artifactual. IMPRESSION: 1. No emergent large vessel occlusion. 2. No infarct core identified. Scattered penumbra is identified in both cerebral and cerebellar hemispheres, favored artifactual. MRI may be considered for better evaluation. 3. Bulky plaque in the proximal right  internal carotid artery resulting in approximately 65-70% stenosis. 4. Mild plaque in the left common and internal carotid arteries without hemodynamically significant stenosis or occlusion. 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 5. Occluded left external carotid artery. 6. Severe stenosis of the origin of the non dominant left vertebral artery which is diminutive in caliber throughout the remainder of the neck. Patent dominant right vertebral artery. 7. Fetal left PCA with severe stenosis of the PCOM/P2 junction. 8. Mild plaque in the intracranial ICAs without hemodynamically significant stenosis or occlusion. 9. Severe stenosis of the left subclavian artery. These results were called by telephone at the time of interpretation on 08/19/2022 at 3:19 pm to [facility] these results. Electronically Signed   By: PetValetta MoleD.   On: 08/19/2022 15:36   CT HEAD CODE STROKE WO CONTRAST  Result Date: 08/19/2022 CLINICAL DATA:  Code stroke.  Stroke.  Last known normal 0930 hours EXAM: CT HEAD WITHOUT CONTRAST TECHNIQUE: Contiguous axial images were obtained from the base of the skull through the vertex without intravenous contrast. RADIATION DOSE REDUCTION: This exam was performed according to the departmental dose-optimization program which includes automated exposure control, adjustment of the mA and/or kV according to patient size and/or use of iterative reconstruction technique. COMPARISON:  09/02/2021 FINDINGS: Brain: Age related volume loss without subjective lobar predominance. Old small vessel infarctions affect the cerebellum. Probable chronic small-vessel change of the pons. Cerebral hemispheres show chronic small-vessel ischemic change of the white matter. No cortical or large vessel territory infarction. No mass lesion, hemorrhage, hydrocephalus or extra-axial collection. Vascular: There is  atherosclerotic calcification of the major vessels at the base of the brain. Skull: Negative Sinuses/Orbits: Clear/normal Other: None ASPECTS ([place] Program Early CT Score) - Ganglionic level infarction (caudate, lentiform nuclei, internal capsule, insula, M1-M3 cortex): 7 - Supraganglionic infarction (M4-M6 cortex): 3 Total score (0-10 with 10 being normal): 10 IMPRESSION: 1. No acute CT finding. Chronic small-vessel ischemic changes of the cerebral hemispheric white matter and cerebellum. 2. Aspects is 10. These results were communicated to Dr. StaQuinn Axe 2:38 pm on 08/19/2022 by text page via the [facility] system. Electronically Signed   By: MarNelson ChimesD.   On: 08/19/2022 14:39   PROCEDURES: Critical Care performed: Yes, see critical care procedure note(s) .1-3 Lead EKG Interpretation  Performed by: BraNaaman PlummerD Authorized by: BraNaaman PlummerD     Interpretation: normal     ECG rate:  72   ECG rate assessment: normal     Rhythm: sinus rhythm     Ectopy: none     Conduction: normal   Comments:     Right bundle branch block/left anterior fascicular block CRITICAL CARE Performed by: EvaNaaman Plummerotal critical care time: 31 minutes  Critical care time was exclusive of separately billable procedures and treating other patients.  Critical care was necessary to treat or prevent imminent or life-threatening deterioration.  Critical  care was time spent personally by me on the following activities: development of treatment plan with patient and/or surrogate as well as nursing, discussions with consultants, evaluation of patient's response to treatment, examination of patient, obtaining history from patient or surrogate, ordering and performing treatments and interventions, ordering and review of laboratory studies, ordering and review of radiographic studies, pulse oximetry and re-evaluation of patient's condition.  MEDICATIONS ORDERED IN ED: Medications  sodium  chloride flush (NS) 0.9 % injection 3 mL (has no administration in time range)  aspirin tablet 325 mg (has no administration in time range)    Followed by  aspirin EC tablet 81 mg (has no administration in time range)  clopidogrel (PLAVIX) tablet 75 mg (has no administration in time range)  iohexol (OMNIPAQUE) 350 MG/ML injection 75 mL (75 mLs Intravenous Contrast Given 08/19/22 1456)   IMPRESSION / MDM / ASSESSMENT AND PLAN / ED COURSE  I reviewed the triage vital signs and the nursing notes.                             The patient is on the cardiac monitor to evaluate for evidence of arrhythmia and/or significant heart rate changes. Patient's presentation is most consistent with acute presentation with potential threat to life or bodily function. Patient is a 88 year old female who presents with symptoms concerning for strokelike symptoms and therefore code stroke was initiated PMH risk factors: Carotid stenosis, hypertension, atherosclerotic disease Neurologic Deficits: Bilateral upper extremity weakness, disorientation Last known Well Time: 0930 08/19/22 NIH Stroke Score: 5 Given History and Exam I have lower suspicion for infectious etiology, neurologic changes secondary to toxicologic ingestion, seizure, complex migraine. Presentation concerning for possible stroke requiring workup.  Workup: Labs: POC glucose, CBC, BMP, LFTs, Troponin, PT/INR, PTT, Type and Screen Other Diagnostics: ECG, CXR, non-contrast head CT followed by CTA brain and neck (to r/o large vessel occlusion amenable to thrombectomy) Interventions: Patient's not eligible for TPA due to being outside of the window  Consult: hospitalist Disposition: Admit   FINAL CLINICAL IMPRESSION(S) / ED DIAGNOSES   Final diagnoses:  Upper extremity weakness  Disorientation   Rx / DC Orders   ED Discharge Orders     None      Note:  This document was prepared using Dragon voice recognition software and may include  unintentional dictation errors.   Naaman Plummer, MD 08/19/22 [phone]

## 2022-08-19 NOTE — ED Notes (Signed)
Pt alert, laying calmly on stretcher, resp reg/unlabored, skin dry.

## 2022-08-19 NOTE — ED Notes (Signed)
Call bell within reach of pt's L hand, stretcher locked low, rail up and remote to tv within reach.

## 2022-08-19 NOTE — ED Notes (Signed)
Pt's left lower leg edematous; pt states this is baseline for past 1.5 years.

## 2022-08-19 NOTE — ED Provider Notes (Signed)
Patient still in CT scan with Dr. Quinn Axe in the code stroke neurology team.  I have not yet seen the patient, but anticipate my oncoming partner will be the physician who will provide full ED evaluation   Delman Kitten, MD 08/19/22 1450

## 2022-08-19 NOTE — Consult Note (Signed)
NEUROLOGY CONSULTATION NOTE   Date of service: August 19, 2022 Patient Name: Cassandra [name] MRN:  [id number] DOB:  [birth year] Reason for consult: stroke code Requesting physician: Dr. Valora Piccolo _ _ _   _ __   _ __ _ _  __ __   _ __   __ _  History of Present Illness   This is an 88 yo woman with hx HTN, CAD, DM2 who presents with acute onset RUE weakness at 9:30AM. She was unable to walk when working with PT and EMS was called. Patient tells me that she is not having any abnormal sx and she is not weak anywhere but she only has a flicker of movement in her fingers on R with no movement anti-gravity. NIHSS = 6. CT head no acute finding on personal review. Not a TNK candidate 2/2 presentation outside the window. CTA and CTP were performed which showed:  1. No emergent large vessel occlusion. 2. No infarct core identified. Scattered penumbra is identified in both cerebral and cerebellar hemispheres, favored artifactual. MRI may be considered for better evaluation. 3. Bulky plaque in the proximal right internal carotid artery resulting in approximately 65-70% stenosis. 4. Mild plaque in the left common and internal carotid arteries without hemodynamically significant stenosis or occlusion. 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 5. Occluded left external carotid artery. 6. Severe stenosis of the origin of the non dominant left vertebral artery which is diminutive in caliber throughout the remainder of the neck. Patent dominant right vertebral artery. 7. Fetal left PCA with severe stenosis of the PCOM/P2 junction. 8. Mild plaque in the intracranial ICAs without hemodynamically significant stenosis or occlusion. 9. Severe stenosis of the left subclavian artery.  (Personally reviewed and discussed with radiology and Dr. Estanislado Pandy of neuro IR).  No intervention indicated 2/2 no LVO. CTP was performed despite  patient stating sx began within past 6 hours because she was unaware of her profound weakness even during the stroke assessment and therefore was not felt to reliably know whether sx started within the past 6 hrs or not.   ROS   Per HPI: all other systems reviewed and are negative  Past History   I have reviewed the following:  Past Medical History:  Diagnosis Date   Acquired hypothyroidism 01/06/2014   Anxiety    Arthritis    B12 deficiency 02/15/2014   Benign essential hypertension 01/06/2014   Breast cancer ([place]) 1982   Right breast cancer - chemotherapy   CAD (coronary artery disease), native coronary artery 01/06/2014   Carotid artery calcification    Chronic airway obstruction, not elsewhere classified 01/06/2014   Chronic mesenteric ischemia (HCC)    s/p SMA stent 11/03/19   Depression    Diabetes mellitus without complication ([place])    Dysrhythmia    aflutter with RVR 06/2016   History of kidney stones 2019   left ureteral stone   Incontinence of urine    Myocardial infarction ([place]) 1982   small infarct   Neuromuscular disorder (HCC)    restless legs   Pacemaker    Presence of permanent cardiac pacemaker 07/2016   Pure hypercholesterolemia 01/06/2014   Renal insufficiency    Skin cancer    Sleep apnea    no longer uses a cpap. Uses oxygen as needed   Past Surgical History:  Procedure Laterality Date   ANKLE FUSION Left 10/10/2021   Procedure: ANKLE FUSION;  Surgeon: Shona Needles, MD;  Location: [place] [facility];  Service: Orthopedics;  Laterality: Left;   APPENDECTOMY     APPLICATION OF WOUND VAC Left 02/24/2021   Procedure: APPLICATION OF WOUND VAC;  Surgeon: Rod Can, MD;  Location: [facility];  Service: Orthopedics;  Laterality: Left;   AUGMENTATION MAMMAPLASTY Bilateral 1982/redo in 2014   prior mastectomy   CARDIAC CATHETERIZATION N/A 04/23/2016   Procedure: Left Heart Cath and Coronary Angiography;  Surgeon: Corey Skains, MD;  Location: [facility];  Service: Cardiovascular;  Laterality: N/A;   CARDIAC CATHETERIZATION Left 04/23/2016   Procedure: Coronary Stent Intervention;  Surgeon: Yolonda Kida, MD;  Location: [facility];  Service: Cardiovascular;  Laterality: Left;   CAROTID ARTERY ANGIOPLASTY Left 2010   had stent inserted and removed d/t infection. artery from leg inserted in left carotid   CAROTID ENDARTERECTOMY Left 2010   left CEA ~ 2010, s/p excision infected left carotid patch & pseudoaneurysm with left CCA-ICA bypass using SVG 05/20/12   CHOLECYSTECTOMY     CORONARY ANGIOPLASTY WITH STENT PLACEMENT  september 9th 2017   CYSTOSCOPY W/ URETERAL STENT PLACEMENT Left 08/17/2017   Procedure: CYSTOSCOPY WITH RETROGRADE PYELOGRAM/URETERAL STENT PLACEMENT;  Surgeon: Festus Aloe, MD;  Location: [facility];  Service: Urology;  Laterality: Left;   CYSTOSCOPY W/ URETERAL STENT PLACEMENT Left 09/16/2017   Procedure: CYSTOSCOPY WITH STENT REPLACEMENT;  Surgeon: Abbie Sons, MD;  Location: [facility];  Service: Urology;  Laterality: Left;   CYSTOSCOPY/RETROGRADE/URETEROSCOPY/STONE EXTRACTION WITH BASKET Left 09/16/2017   Procedure: CYSTOSCOPY/RETROGRADE/URETEROSCOPY/STONE EXTRACTION WITH BASKET;  Surgeon: Abbie Sons, MD;  Location: [facility];  Service: Urology;  Laterality: Left;   EXTERNAL FIXATION LEG Left 02/24/2021   Procedure: EXTERNAL FIXATION  ANKLE;  Surgeon: Rod Can, MD;  Location: [place];  Service: Orthopedics;  Laterality: Left;   EXTERNAL FIXATION REMOVAL Left 02/26/2021   Procedure: REMOVAL EXTERNAL FIXATION LEG;  Surgeon: Shona Needles, MD;  Location: [facility] [place];  Service: Orthopedics;  Laterality: Left;   EYE SURGERY Bilateral    cataract extraction   I & D EXTREMITY Left 02/24/2021   Procedure: IRRIGATION AND DEBRIDEMENT EXTREMITY;  Surgeon: Rod Can, MD;  Location: [place];  Service: Orthopedics;  Laterality: Left;   I & D EXTREMITY Left 12/21/2021   Procedure: IRRIGATION AND  DEBRIDEMENT LEFT ANKLE;  Surgeon: Shona Needles, MD;  Location: [place];  Service: Orthopedics;  Laterality: Left;   I & D EXTREMITY Left 05/03/2022   Procedure: DEBRIDEMENT OF LEFT ANKLE INFECTION;  Surgeon: Shona Needles, MD;  Location: [place];  Service: Orthopedics;  Laterality: Left;   IR FLUORO GUIDE CV LINE LEFT  12/27/2021   IR FLUORO GUIDE CV LINE RIGHT  10/12/2021   IR REMOVAL TUN CV CATH W/O FL  11/22/2021   IR US GUIDE VASC ACCESS LEFT  12/27/2021   IR US GUIDE VASC ACCESS RIGHT  10/12/2021   KYPHOPLASTY N/A 02/03/2018   Procedure: [id number];  Surgeon: Hessie Knows, MD;  Location: [facility];  Service: Orthopedics;  Laterality: N/A;   MASTECTOMY Right 1982   ORIF ANKLE FRACTURE Left 02/26/2021   Procedure: OPEN REDUCTION INTERNAL FIXATION (ORIF) ANKLE FRACTURE;  Surgeon: Shona Needles, MD;  Location: [facility];  Service: Orthopedics;  Laterality: Left;   PACEMAKER INSERTION Left 07/03/2016   Procedure: INSERTION PACEMAKER;  Surgeon: Isaias Cowman, MD;  Location: [facility];  Service: Cardiovascular;  Laterality: Left;   SKIN CANCER EXCISION  TONSILLECTOMY     TUBAL LIGATION     VISCERAL ANGIOGRAPHY N/A 11/03/2019   Procedure: VISCERAL ANGIOGRAPHY;  Surgeon: Katha Cabal, MD;  Location: [facility];  Service: Cardiovascular;  Laterality: N/A;   Family History  Problem Relation Age of Onset   Prostate cancer Neg Hx    Kidney cancer Neg Hx    Breast cancer Neg Hx    Social History   Socioeconomic History   Marital status: Married    Spouse name: [name]   Number of children: 3   Years of education: Not on file   Highest education level: High school graduate  Occupational History   Not on file  Tobacco Use   Smoking status: Former    Packs/day: 0.50    Types: Cigarettes    Start date: 1955    Quit date: 1956    Years since quitting: 68.0   Smokeless tobacco: Never  Vaping Use   Vaping Use: Never used  Substance and Sexual Activity   Alcohol use:  Yes    Comment: occasional wine   Drug use: Not Currently   Sexual activity: Not Currently  Other Topics Concern   Not on file  Social History Narrative   ** Merged History Encounter **       Lives with husband and dog    Social Determinants of Health   Financial Resource Strain: Low Risk  (10/04/2018)   Overall Financial Resource Strain (CARDIA)    Difficulty of Paying Living Expenses: Not hard at all  Food Insecurity: No Food Insecurity (10/04/2018)   Hunger Vital Sign    Worried About Running Out of Food in the Last Year: Never true    [place] in the Last Year: Never true  Transportation Needs: No Transportation Needs (10/04/2018)   PRAPARE - Hydrologist (Medical): No    Lack of Transportation (Non-Medical): No  Physical Activity: Unknown (10/04/2018)   Exercise Vital Sign    Days of Exercise per Week: Patient refused    Minutes of Exercise per Session: Patient refused  Stress: Not on file  Social Connections: Unknown (10/04/2018)   Social Connection and Isolation Panel [NHANES]    Frequency of Communication with Friends and Family: Patient refused    Frequency of Social Gatherings with Friends and Family: Patient refused    Attends Religious Services: Patient refused    Marine scientist or Organizations: Patient refused    Attends Archivist Meetings: Patient refused    Marital Status: Patient refused   Allergies  Allergen Reactions   Baclofen Other (See Comments)    Hallucinations   Levofloxacin Other (See Comments)    Hallucinations     Medications   (Not in a hospital admission)     Current Facility-Administered Medications:    sodium chloride flush (NS) 0.9 % injection 3 mL, 3 mL, Intravenous, Once, Delman Kitten, MD  Current Outpatient Medications:    acetaminophen (TYLENOL) 500 MG tablet, Take 500-1,000 mg by mouth every 6 (six) hours as needed for moderate pain., Disp: , Rfl:    albuterol (VENTOLIN HFA) 108  (90 Base) MCG/ACT inhaler, Inhale 2 puffs into the lungs every 6 (six) hours as needed for wheezing or shortness of breath., Disp: 8 g, Rfl: 0   amikacin (AMIKIN) 1 GM/4ML SOLN injection, , Disp: , Rfl:    amitriptyline (ELAVIL) 10 MG tablet, Take 1 tablet by mouth at bedtime., Disp: , Rfl:  cefOXitin (MEFOXIN) 2 g injection, Inject 2 g into the vein every 6 (six) hours., Disp: , Rfl:    Cholecalciferol (VITAMIN D3 PO), Take 1 capsule by mouth daily., Disp: , Rfl:    CLOFAZIMINE PO, Take 100 mg by mouth daily., Disp: , Rfl:    desvenlafaxine (PRISTIQ) 50 MG 24 hr tablet, Take 50 mg by mouth daily., Disp: , Rfl:    furosemide (LASIX) 20 MG tablet, Take 20 mg by mouth daily., Disp: , Rfl:    insulin aspart (NOVOLOG FLEXPEN) 100 UNIT/ML FlexPen, Inject 4 Units into the skin daily., Disp: , Rfl:    insulin glargine (LANTUS SOLOSTAR) 100 UNIT/ML Solostar Pen, Inject 8 Units into the skin at bedtime., Disp: , Rfl:    levothyroxine (SYNTHROID) 100 MCG tablet, Take 100 mcg by mouth every morning., Disp: , Rfl:    linezolid (ZYVOX) 600 MG tablet, Take 1 tablet (600 mg total) by mouth daily., Disp: 30 tablet, Rfl: 5   metoCLOPramide (REGLAN) 5 MG tablet, Take 5 mg by mouth 2 (two) times daily., Disp: , Rfl:    metoprolol tartrate (LOPRESSOR) 25 MG tablet, Take 1 tablet (25 mg total) by mouth 2 (two) times daily., Disp: 60 tablet, Rfl: 2   Multiple Vitamin (MULTIVITAMIN WITH MINERALS) TABS tablet, Take 1 tablet by mouth daily., Disp: 30 tablet, Rfl: 0   nitroGLYCERIN (NITROSTAT) 0.4 MG SL tablet, Place 1 tablet (0.4 mg total) under the tongue every 5 (five) minutes as needed for chest pain., Disp: 90 tablet, Rfl: 0   telmisartan (MICARDIS) 40 MG tablet, Take 40 mg by mouth daily., Disp: , Rfl:    traZODone (DESYREL) 50 MG tablet, Take 50 mg by mouth at bedtime., Disp: , Rfl:   Vitals   There were no vitals filed for this visit.   There is no height or weight on file to calculate BMI.  Physical Exam    Physical Exam Gen: alert and oriented to self, hospital, age, but not month HEENT: Atraumatic, normocephalic;mucous membranes moist; oropharynx clear, tongue without atrophy or fasciculations. Neck: Supple, trachea midline. Resp: CTAB, no w/r/r CV: RRR, no m/g/r; nml S1 and S2. 2+ symmetric peripheral pulses. Abd: soft/NT/ND; nabs x 4 quad Extrem: Nml bulk; no cyanosis, clubbing, or edema.  Neuro: *MS: A&O x4. Follows multi-step commands.  *Speech: fluid, nondysarthric, able to name and repeat *CN:    I: Deferred   II,III: PERRLA, VFF by confrontation, optic discs unable to be visualized 2/2 pupillary constriction   III,IV,VI: EOMI w/o nystagmus, no ptosis   V: Sensation intact from V1 to V3 to LT   VII: Eyelid closure was full.  Smile symmetric.   VIII: Hearing intact to voice   IX,X: Voice normal, palate elevates symmetrically    XI: SCM/trap 5/5 bilat   XII: Tongue protrudes midline, no atrophy or fasciculations   *Motor:   Normal bulk.  No tremor, rigidity or bradykinesia. Drift LUE but not to bed. Minimal movement distal RUE and no movement against gravity in that extremity. No drift BLE. *Sensory: SILT. Extinguishes to DSS on RLE. *Coordination:  FNF intact on L *Reflexes:  1+ and symmetric throughout without clonus; toes down-going bilat *Gait: deferred  NIHSS  1a Level of Conscious.: 0 1b LOC Questions: 1 1c LOC Commands: 0 2 Best Gaze: 0 3 Visual: 0 4 Facial Palsy: 0 5a Motor Arm - left: 1 5b Motor Arm - Right: 3 6a Motor Leg - Left: 0 6b Motor Leg - Right: 0 7 Limb  Ataxia: 0 8 Sensory: 0 9 Best Language: 0 10 Dysarthria: 0 11 Extinct. and Inatten.: 1  TOTAL: 6   Premorbid mRS = 3   Labs   CBC: No results for input(s): "WBC", "NEUTROABS", "HGB", "HCT", "MCV", "PLT" in the last 168 hours.  Basic Metabolic Panel:  Lab Results  Component Value Date   NA 142 08/10/2022   K 3.4 (L) 08/10/2022   CO2 29 08/10/2022   GLUCOSE 109 (H) 08/10/2022    BUN 18 08/10/2022   CREATININE 1.95 (H) 08/10/2022   CALCIUM 8.5 (L) 08/10/2022   GFRNONAA 24 (L) 08/10/2022   GFRAA >60 11/03/2019   Lipid Panel:  Lab Results  Component Value Date   LDLCALC 60 06/03/2022   HgbA1c:  Lab Results  Component Value Date   HGBA1C 7.0 (H) 05/03/2022   Urine Drug Screen:     Component Value Date/Time   LABOPIA NONE DETECTED 08/18/2018 1127   COCAINSCRNUR NONE DETECTED 08/18/2018 1127   LABBENZ POSITIVE (A) 08/18/2018 1127   AMPHETMU NONE DETECTED 08/18/2018 1127   THCU NONE DETECTED 08/18/2018 1127   LABBARB NONE DETECTED 08/18/2018 1127    Alcohol Level     Component Value Date/Time   ETH <10 02/24/2021 1643     Impression   This is an 88 yo woman with hx HTN, CAD, DM2 who presents with acute onset RUE weakness at 9:30AM c/f acute ischemic stroke. She was outside the window for TNK and did not undergo intervention 2/2 no LVO on CTA.   Recommendations   - Admit for stroke workup - Permissive HTN x48 hrs from sx onset or until stroke ruled out by MRI goal BP <220/110. PRN labetalol or hydralazine if BP above these parameters. Avoid oral antihypertensives. - MRI brain wo contrast - TTE  - Check A1c and LDL + add statin per guidelines - ASA '81mg'$  daily + plavix '75mg'$  daily x21 days f/b ASA '81mg'$  daily monotherapy after that - q4 hr neuro checks - STAT head CT for any change in neuro exam - Tele - PT/OT/SLP - Stroke education - Amb referral to neurology upon discharge - Will continue to follow  ______________________________________________________________________   Thank you for the opportunity to take part in the care of this patient. If you have any further questions, please contact the neurology consultation attending.  Signed,  Su Monks, MD [facility] [phone]  If 7pm- 7am, please page neurology on call as listed in [place].  **Any copied and pasted documentation in this note was written by me in another  application not billed for and pasted by me into this document.

## 2022-08-19 NOTE — ED Notes (Signed)
Messaged pharm to get scheduled meds verified and sent to ER if not located in pyxis. Will give meds once verified and received.

## 2022-08-19 NOTE — ED Notes (Signed)
Pt's briefs changed at her request as minimally wet though urine noted in pure wick canister. Pure wick reapplied.

## 2022-08-19 NOTE — ED Triage Notes (Addendum)
Pt comes via EMS from home with c/o possible stroke. LKW was at 930am. Pt having some weakness and unable to walk.    Neurologist and Stroke Coordinator, Microbiologist at bedside. Pt to be taken to CT

## 2022-08-19 NOTE — ED Notes (Signed)
Patient keeps removing her oxygen.  Placed oxygen back on patient.

## 2022-08-19 NOTE — Progress Notes (Signed)
   08/19/22 1500  Clinical Encounter Type  Visited With Family  Visit Type Initial;Code  Referral From Physician  Consult/Referral To Chaplain   Chaplain responded to Code Stroke. Patient at [facility]. Husband present. Chaplain offered services as needed.

## 2022-08-19 NOTE — H&P (Addendum)
History and Physical    Patient: Cassandra [name] BHA:[id number] DOB: June 27, 1933 DOA: 08/19/2022 DOS: the patient was seen and examined on 08/19/2022 PCP: Idelle Crouch, MD  Patient coming from: Home.  Lives with husband.   Chief Complaint:  Chief Complaint  Patient presents with   Code Stroke   HPI: Cassandra [name] is a 88 y.o. female with PMH of  COPD/chronic hypoxic RF on 2 L, left ankle osteomyelitis on IV antibiotics, IDDM-2, CAD, diastolic CHF, breast cancer s/p right mastectomy, neuromuscular disorder, hypothyroidism, symptomatic bradycardia s/p PPM and hypertension presenting with acute onset right-sided weakness.  Patient hospitalized for acute on chronic diastolic CHF from 97/3-53/2.  History provided by patient and patient's husband at bedside.  Patient noted right-sided weakness including right leg and right arm about 9:30 AM this morning.  She was unable to walk while working with therapy that prompted EMS activation.  Patient denies numbness, tingling, acute vision change, difficulty speaking, fever, chills, chest pain, GI or UTI symptoms.  She reports shortness of breath but improving since last hospitalization.  She also reports dry cough.  Denies orthopnea.  He uses 1 pillow at baseline.  Denies lower extremity edema.  Denies any medication.   Patient lives with husband.  Denies smoking cigarette.  Admits to drinking occasional wine.  Denies recreational drug use.  Likes to remain full code.  In ED, "bilateral upper extremity weakness and hemineglect" Per EDP.  BP as high as 190/114.  CBG normal.  Other vitals stable. Cr 2.4 (baseline 1.6-2)..  BUN 25.  Hgb 9.6.  EtOH level negative.  CT head without acute finding and aspect pain.  CTA head and neck negative for LVO but 65 to 70% right ICA stenosis, occluded left external CEA, severe right vertebral artery stenosis and left subclavian artery stenosis.  Started on Plavix and full dose aspirin.  Neurology consulted and  recommended admission for stroke workup.  Hospitalist service called for admission.  Review of Systems: As mentioned in the history of present illness. All other systems reviewed and are negative. Past Medical History:  Diagnosis Date   Acquired hypothyroidism 01/06/2014   Anxiety    Arthritis    B12 deficiency 02/15/2014   Benign essential hypertension 01/06/2014   Breast cancer ([place]) 1982   Right breast cancer - chemotherapy   CAD (coronary artery disease), native coronary artery 01/06/2014   Carotid artery calcification    Chronic airway obstruction, not elsewhere classified 01/06/2014   Chronic mesenteric ischemia (HCC)    s/p SMA stent 11/03/19   Depression    Diabetes mellitus without complication ([place])    Dysrhythmia    aflutter with RVR 06/2016   History of kidney stones 2019   left ureteral stone   Incontinence of urine    Myocardial infarction ([place]) 1982   small infarct   Neuromuscular disorder (HCC)    restless legs   Pacemaker    Presence of permanent cardiac pacemaker 07/2016   Pure hypercholesterolemia 01/06/2014   Renal insufficiency    Skin cancer    Sleep apnea    no longer uses a cpap. Uses oxygen as needed   Past Surgical History:  Procedure Laterality Date   ANKLE FUSION Left 10/10/2021   Procedure: ANKLE FUSION;  Surgeon: Shona Needles, MD;  Location: [place];  Service: Orthopedics;  Laterality: Left;   APPENDECTOMY     APPLICATION OF WOUND VAC Left 02/24/2021   Procedure: APPLICATION OF WOUND VAC;  Surgeon: Rod Can, MD;  Location: [place];  Service: Orthopedics;  Laterality: Left;   AUGMENTATION MAMMAPLASTY Bilateral 1982/redo in 2014   prior mastectomy   CARDIAC CATHETERIZATION N/A 04/23/2016   Procedure: Left Heart Cath and Coronary Angiography;  Surgeon: Corey Skains, MD;  Location: [place];  Service: Cardiovascular;  Laterality: N/A;   CARDIAC CATHETERIZATION Left 04/23/2016   Procedure: Coronary Stent Intervention;   Surgeon: Yolonda Kida, MD;  Location: [place] [facility];  Service: Cardiovascular;  Laterality: Left;   CAROTID ARTERY ANGIOPLASTY Left 2010   had stent inserted and removed d/t infection. artery from leg inserted in left carotid   CAROTID ENDARTERECTOMY Left 2010   left CEA ~ 2010, s/p excision infected left carotid patch & pseudoaneurysm with left CCA-ICA bypass using SVG 05/20/12   CHOLECYSTECTOMY     CORONARY ANGIOPLASTY WITH STENT PLACEMENT  september 9th 2017   CYSTOSCOPY W/ URETERAL STENT PLACEMENT Left 08/17/2017   Procedure: CYSTOSCOPY WITH RETROGRADE PYELOGRAM/URETERAL STENT PLACEMENT;  Surgeon: Festus Aloe, MD;  Location: [facility];  Service: Urology;  Laterality: Left;   CYSTOSCOPY W/ URETERAL STENT PLACEMENT Left 09/16/2017   Procedure: CYSTOSCOPY WITH STENT REPLACEMENT;  Surgeon: Abbie Sons, MD;  Location: [facility];  Service: Urology;  Laterality: Left;   CYSTOSCOPY/RETROGRADE/URETEROSCOPY/STONE EXTRACTION WITH BASKET Left 09/16/2017   Procedure: CYSTOSCOPY/RETROGRADE/URETEROSCOPY/STONE EXTRACTION WITH BASKET;  Surgeon: Abbie Sons, MD;  Location: [facility];  Service: Urology;  Laterality: Left;   EXTERNAL FIXATION LEG Left 02/24/2021   Procedure: EXTERNAL FIXATION  ANKLE;  Surgeon: Rod Can, MD;  Location: [place];  Service: Orthopedics;  Laterality: Left;   EXTERNAL FIXATION REMOVAL Left 02/26/2021   Procedure: REMOVAL EXTERNAL FIXATION LEG;  Surgeon: Shona Needles, MD;  Location: [place];  Service: Orthopedics;  Laterality: Left;   EYE SURGERY Bilateral    cataract extraction   I & D EXTREMITY Left 02/24/2021   Procedure: IRRIGATION AND DEBRIDEMENT EXTREMITY;  Surgeon: Rod Can, MD;  Location: [place];  Service: Orthopedics;  Laterality: Left;   I & D EXTREMITY Left 12/21/2021   Procedure: IRRIGATION AND DEBRIDEMENT LEFT ANKLE;  Surgeon: Shona Needles, MD;  Location: [place];  Service: Orthopedics;  Laterality: Left;   I & D EXTREMITY Left  05/03/2022   Procedure: DEBRIDEMENT OF LEFT ANKLE INFECTION;  Surgeon: Shona Needles, MD;  Location: [place];  Service: Orthopedics;  Laterality: Left;   IR FLUORO GUIDE CV LINE LEFT  12/27/2021   IR FLUORO GUIDE CV LINE RIGHT  10/12/2021   IR REMOVAL TUN CV CATH W/O FL  11/22/2021   IR US GUIDE VASC ACCESS LEFT  12/27/2021   IR US GUIDE VASC ACCESS RIGHT  10/12/2021   KYPHOPLASTY N/A 02/03/2018   Procedure: [id number];  Surgeon: Hessie Knows, MD;  Location: [facility];  Service: Orthopedics;  Laterality: N/A;   MASTECTOMY Right 1982   ORIF ANKLE FRACTURE Left 02/26/2021   Procedure: OPEN REDUCTION INTERNAL FIXATION (ORIF) ANKLE FRACTURE;  Surgeon: Shona Needles, MD;  Location: [place];  Service: Orthopedics;  Laterality: Left;   PACEMAKER INSERTION Left 07/03/2016   Procedure: INSERTION PACEMAKER;  Surgeon: Isaias Cowman, MD;  Location: [facility];  Service: Cardiovascular;  Laterality: Left;   SKIN CANCER EXCISION     TONSILLECTOMY     TUBAL LIGATION     VISCERAL ANGIOGRAPHY N/A 11/03/2019   Procedure: VISCERAL ANGIOGRAPHY;  Surgeon: Katha Cabal, MD;  Location: [place] [facility];  Service: Cardiovascular;  Laterality: N/A;   Social  History:  reports that she quit smoking about 68 years ago. Her smoking use included cigarettes. She started smoking about 69 years ago. She smoked an average of .5 packs per day. She has never used smokeless tobacco. She reports current alcohol use. She reports that she does not currently use drugs.  Allergies  Allergen Reactions   Baclofen Other (See Comments)    Hallucinations   Levofloxacin Other (See Comments)    Hallucinations     Family History  Problem Relation Age of Onset   Prostate cancer Neg Hx    Kidney cancer Neg Hx    Breast cancer Neg Hx     Prior to Admission medications   Medication Sig Start Date End Date Taking? Authorizing Provider  acetaminophen (TYLENOL) 500 MG tablet Take 500-1,000 mg by mouth every 6 (six) hours  as needed for moderate pain.   Yes [provider]  albuterol (VENTOLIN HFA) 108 (90 Base) MCG/ACT inhaler Inhale 2 puffs into the lungs every 6 (six) hours as needed for wheezing or shortness of breath. 03/22/21  Yes Angiulli, Lavon Paganini, PA-C  amikacin (AMIKIN) 1 GM/4ML SOLN injection Inject 800 mg into the vein every Tuesday, Thursday, and Saturday at 6 PM.   Yes [provider]  amitriptyline (ELAVIL) 10 MG tablet Take 10 mg by mouth at bedtime. 07/17/22 07/17/23 Yes [provider]  cefOXitin (MEFOXIN) 2 g injection Inject 2 g into the vein every 12 (twelve) hours.   Yes [provider]  cholecalciferol (VITAMIN D3) 25 MCG (1000 UNIT) tablet Take 1,000 Units by mouth daily.   Yes [provider]  CLOFAZIMINE PO Take 100 mg by mouth every morning.   Yes [provider]  desvenlafaxine (PRISTIQ) 50 MG 24 hr tablet Take 50 mg by mouth every evening.   Yes [provider]  furosemide (LASIX) 20 MG tablet Take 20 mg by mouth daily.   Yes [provider]  insulin aspart (NOVOLOG FLEXPEN) 100 UNIT/ML FlexPen Inject 4 Units into the skin daily.   Yes [provider]  insulin glargine (LANTUS SOLOSTAR) 100 UNIT/ML Solostar Pen Inject 8 Units into the skin at bedtime. 06/04/22  Yes Jennye Boroughs, MD  levothyroxine (SYNTHROID) 100 MCG tablet Take 100 mcg by mouth every morning. 01/22/22  Yes [provider]  linezolid (ZYVOX) 600 MG tablet Take 1 tablet (600 mg total) by mouth daily. 04/24/22  Yes Kuppelweiser, Cassie L, RPH-CPP  metoCLOPramide (REGLAN) 5 MG tablet Take 5 mg by mouth 2 (two) times daily before a meal.   Yes [provider]  metoprolol tartrate (LOPRESSOR) 25 MG tablet Take 1 tablet (25 mg total) by mouth 2 (two) times daily. 08/10/22 11/08/22 Yes Mariel Aloe, MD  Multiple Vitamin (MULTIVITAMIN WITH MINERALS) TABS tablet Take 1 tablet by mouth daily. 09/24/17  Yes Max Sane, MD  nitroGLYCERIN  (NITROSTAT) 0.4 MG SL tablet Place 1 tablet (0.4 mg total) under the tongue every 5 (five) minutes as needed for chest pain. 04/24/16  Yes Mody, Ulice Bold, MD  telmisartan (MICARDIS) 40 MG tablet Take 40 mg by mouth daily.   Yes [provider]  traZODone (DESYREL) 50 MG tablet Take 50 mg by mouth at bedtime.   Yes [provider]    Physical Exam: Vitals:   08/19/22 1425 08/19/22 1600  BP: (!) 170/90 (!) 194/114  Pulse:  72  Resp:  19  SpO2:  99%   GENERAL: No apparent distress.  Nontoxic. HEENT: MMM.  Vision and  hearing grossly intact.  NECK: Supple.  No apparent JVD.  RESP:  No IWOB.  Fair aeration bilaterally. CVS:  RRR. Heart sounds normal.  ABD/GI/GU: BS+. Abd soft, NTND.  MSK/EXT:  Moves extremities.  LLE swelling (chronic from trauma). SKIN: no apparent skin lesion or wound NEURO: Awake and alert.  Oriented x 4.  Clear speech.  PERRL.  EOMI.  No facial asymmetry.  Symmetric shoulder shrugging.  Motor 2/5 in RUE but fair grip strength.  Motor 4+/5 in LUE, RLE and 4/5 in LLE.  Sensory intact in all dermatomes.  Symmetric reflex. PSYCH: Calm. Normal affect.   Data Reviewed: Labs, EKG and imaging as above.  Assessment and Plan: Principal Problem:   Stroke-like symptom Active Problems:   Chronic diastolic CHF (congestive heart failure) (HCC)   Hypertensive urgency   Chronic osteomyelitis involving ankle and foot, left (HCC)   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   COPD (chronic obstructive pulmonary disease) (HCC)   CAD (coronary artery disease)   Insulin-requiring or dependent type II diabetes mellitus (HCC)   Sick sinus syndrome ([place])   Hardware complicating wound infection (HCC)   CKD (chronic kidney disease) stage 4, GFR 15-29 ml/min (HCC)   AKI (acute kidney injury) ([place])   Strokelike symptoms/right upper extremity paresis-exam notable for RUE weakness.  CTH without acute finding.  Aspect pain. Right ICA stenosis/occluded left external  carotid artery/severe right vertebral artery stenosis/left subclavian stenosis -CTA head and neck finding does not explain patient's symptoms -MRI brain-pacemaker compatible with MRI per op note from 2017. -Echocardiogram -Aspirin and Plavix per neurology. -Permissive hypertension -Follow-up further neurology recommendation. -PT/OT/SLP -N.p.o. pending stroke eval  Addendum Not able to do MRI and presence of PPM at [facility].  Neurology recommended repeat CT head on 12/19  Hypertensive urgency: BP elevated to 190/114. -Permissive hypertension to 993/716  Chronic diastolic CHF: Appears euvolemic. Prior TTE with grade 2 DD..  On p.o. Lasix 20 mg at home. -Hold diuretics for permissive hypertension -Monitor intake and output.  History of SSS/symptomatic bradycardia s/p PPM  AKI on CKD-4: Baseline Cr seems to range from 1.6-2.0. Recent Labs    01/29/22 1204 01/29/22 2148 01/31/22 0642 02/01/22 0450 04/12/22 1649 05/03/22 0546 05/04/22 0124 06/02/22 1442 06/03/22 0615 06/04/22 0433 08/02/22 1421 08/09/22 1436 08/10/22 0418 08/19/22 1501  BUN '12 11 12 12  '$ --  24* 28* 22  --   --   --  17 18 25*  CREATININE 1.14* 1.22* 0.92 1.10* 1.60* 1.62* 1.97* 1.86* 1.66* 1.70* 1.60* 2.13*  2.06* 1.95* 2.39*  -Avoid nephrotoxic meds  Chronic COPD: Appears stable. -Resume home inhalers  IDDM-2: On Lantus and NovoLog at home. -CBG monitoring and SSI-very sensitive  Chronic osteomyelitis of left ankle/left foot: Reportedly uses antibiotic infusion at home. -Resume home antibiotics after med rec  Hypothyroidism -Continue home Synthroid once she has a stroke swallow eval.  History of breast cancer status post right mastectomy -Outpatient follow-up.   Advance Care Planning:   Code Status: Full Code discussed with patient and patient's husband at bedside  Consults: Neurology  Family Communication: Updated patient's husband at bedside  Severity of Illness: The appropriate patient  status for this patient is OBSERVATION. Observation status is judged to be reasonable and necessary in order to provide the required intensity of service to ensure the patient's safety. The patient's presenting symptoms, physical exam findings, and initial radiographic and laboratory data in the context of their medical condition is felt to place them at decreased risk for further  clinical deterioration. Furthermore, it is anticipated that the patient will be medically stable for discharge from the hospital within 2 midnights of admission.   Author: Mercy Riding, MD 08/19/2022 5:12 PM  For on call review [email or web address].

## 2022-08-19 NOTE — ED Notes (Signed)
Pt's sheets and gown wet with urine. Pt provided peri care, briefs changed, linens changed and pt repositioned in bed. Pure wick applied.

## 2022-08-19 NOTE — ED Notes (Signed)
Pt given 2 warm blankets. Pt resting calmly in bed.

## 2022-08-19 NOTE — ED Notes (Signed)
MD notified of patient failing swallow screen

## 2022-08-20 ENCOUNTER — Observation Stay: Payer: Medicare Other

## 2022-08-20 ENCOUNTER — Observation Stay
Admit: 2022-08-20 | Discharge: 2022-08-20 | Disposition: A | Discharge status: Home / Self Care | Payer: Medicare Other | Attending: Student | Admitting: Student

## 2022-08-20 DIAGNOSIS — I639 Cerebral infarction, unspecified: Secondary | ICD-10-CM | POA: Diagnosis not present

## 2022-08-20 DIAGNOSIS — I480 Paroxysmal atrial fibrillation: Secondary | ICD-10-CM

## 2022-08-20 DIAGNOSIS — R2990 Unspecified symptoms and signs involving the nervous system: Secondary | ICD-10-CM | POA: Diagnosis not present

## 2022-08-20 LAB — LIPID PANEL
Cholesterol: 110 mg/dL (ref 0–200)
HDL: 56 mg/dL (ref >40)
LDL Cholesterol: 39 mg/dL (ref 0–99)
Total CHOL/HDL Ratio: 2 RATIO
Triglycerides: 74 mg/dL (ref <150)
VLDL: 15 mg/dL (ref 0–40)

## 2022-08-20 LAB — GLUCOSE, CAPILLARY
Glucose-Capillary: 105 mg/dL — ABNORMAL HIGH (ref 70–99)
Glucose-Capillary: 118 mg/dL — ABNORMAL HIGH (ref 70–99)
Glucose-Capillary: 137 mg/dL — ABNORMAL HIGH (ref 70–99)
Glucose-Capillary: 152 mg/dL — ABNORMAL HIGH (ref 70–99)
Glucose-Capillary: 205 mg/dL — ABNORMAL HIGH (ref 70–99)

## 2022-08-20 LAB — BASIC METABOLIC PANEL
Anion gap: 8 (ref 5–15)
BUN: 27 mg/dL — ABNORMAL HIGH (ref 8–23)
CO2: 24 mmol/L (ref 22–32)
Calcium: 8.7 mg/dL — ABNORMAL LOW (ref 8.9–10.3)
Chloride: 108 mmol/L (ref 98–111)
Creatinine, Ser: 2.27 mg/dL — ABNORMAL HIGH (ref 0.44–1.00)
GFR, Estimated: 20 mL/min — ABNORMAL LOW (ref >60)
Glucose, Bld: 116 mg/dL — ABNORMAL HIGH (ref 70–99)
Potassium: 3.9 mmol/L (ref 3.5–5.1)
Sodium: 140 mmol/L (ref 135–145)

## 2022-08-20 LAB — CBC
HCT: 30 % — ABNORMAL LOW (ref 36.0–46.0)
Hemoglobin: 9.6 g/dL — ABNORMAL LOW (ref 12.0–15.0)
MCH: 29.2 pg (ref 26.0–34.0)
MCHC: 32 g/dL (ref 30.0–36.0)
MCV: 91.2 fL (ref 80.0–100.0)
Platelets: 222 10*3/uL (ref 150–400)
RBC: 3.29 MIL/uL — ABNORMAL LOW (ref 3.87–5.11)
RDW: 19.1 % — ABNORMAL HIGH (ref 11.5–15.5)
WBC: 10 10*3/uL (ref 4.0–10.5)
nRBC: 0 % (ref 0.0–0.2)

## 2022-08-20 LAB — ECHOCARDIOGRAM COMPLETE
AR max vel: 1.2 cm2
AV Area VTI: 1.41 cm2
AV Area mean vel: 1.34 cm2
AV Mean grad: 4 mmHg
AV Peak grad: 7.2 mmHg
Ao pk vel: 1.34 m/s
Area-P 1/2: 5.38 cm2
S' Lateral: 3.4 cm

## 2022-08-20 MED ORDER — LEVOTHYROXINE SODIUM 100 MCG PO TABS
100.0000 ug | ORAL_TABLET | Freq: Every day | ORAL | Status: DC
  Administered 2022-08-21: 100 ug via ORAL
  Filled 2022-08-20: qty 1

## 2022-08-20 MED ORDER — AMITRIPTYLINE HCL 10 MG PO TABS
10.0000 mg | ORAL_TABLET | Freq: Every day | ORAL | Status: DC
  Administered 2022-08-20: 10 mg via ORAL
  Filled 2022-08-20: qty 1

## 2022-08-20 MED ORDER — INSULIN ASPART 100 UNIT/ML IJ SOLN: 0.0000 [IU] | Freq: Three times a day (TID) | INTRAMUSCULAR | Status: DC

## 2022-08-20 NOTE — Progress Notes (Signed)
SLP Cancellation Note  Patient Details Name: Cassandra [name] MRN: [id number] DOB: 1932/09/23   Cancelled treatment:       Reason Eval/Treat Not Completed: SLP screened, no needs identified, will sign off  Fynn Adel B. Rutherford Nail, M.S., CCC-SLP, Mining engineer Certified Brain Injury [place]  [place] Office [phone] Ascom [phone] Fax [phone]

## 2022-08-20 NOTE — Evaluation (Signed)
Clinical/Bedside Swallow Evaluation Patient Details  Name: Cassandra [name] MRN: [id number] Date of Birth: [birth year]  Today's Date: 08/20/2022 Time: SLP Start Time (ACUTE ONLY): 57 SLP Stop Time (ACUTE ONLY): 0745 SLP Time Calculation (min) (ACUTE ONLY): 25 min  Past Medical History:  Past Medical History:  Diagnosis Date   Acquired hypothyroidism 01/06/2014   Anxiety    Arthritis    B12 deficiency 02/15/2014   Benign essential hypertension 01/06/2014   Breast cancer ([place]) 1982   Right breast cancer - chemotherapy   CAD (coronary artery disease), native coronary artery 01/06/2014   Carotid artery calcification    Chronic airway obstruction, not elsewhere classified 01/06/2014   Chronic mesenteric ischemia (HCC)    s/p SMA stent 11/03/19   Depression    Diabetes mellitus without complication ([place])    Dysrhythmia    aflutter with RVR 06/2016   History of kidney stones 2019   left ureteral stone   Incontinence of urine    Myocardial infarction ([place]) 1982   small infarct   Neuromuscular disorder (HCC)    restless legs   Pacemaker    Presence of permanent cardiac pacemaker 07/2016   Pure hypercholesterolemia 01/06/2014   Renal insufficiency    Skin cancer    Sleep apnea    no longer uses a cpap. Uses oxygen as needed   Past Surgical History:  Past Surgical History:  Procedure Laterality Date   ANKLE FUSION Left 10/10/2021   Procedure: ANKLE FUSION;  Surgeon: Shona Needles, MD;  Location: [facility];  Service: Orthopedics;  Laterality: Left;   APPENDECTOMY     APPLICATION OF WOUND VAC Left 02/24/2021   Procedure: APPLICATION OF WOUND VAC;  Surgeon: Rod Can, MD;  Location: [place] [facility];  Service: Orthopedics;  Laterality: Left;   AUGMENTATION MAMMAPLASTY Bilateral 1982/redo in 2014   prior mastectomy   CARDIAC CATHETERIZATION N/A 04/23/2016   Procedure: Left Heart Cath and Coronary Angiography;  Surgeon: Corey Skains, MD;  Location: [facility];  Service:  Cardiovascular;  Laterality: N/A;   CARDIAC CATHETERIZATION Left 04/23/2016   Procedure: Coronary Stent Intervention;  Surgeon: Yolonda Kida, MD;  Location: [facility];  Service: Cardiovascular;  Laterality: Left;   CAROTID ARTERY ANGIOPLASTY Left 2010   had stent inserted and removed d/t infection. artery from leg inserted in left carotid   CAROTID ENDARTERECTOMY Left 2010   left CEA ~ 2010, s/p excision infected left carotid patch & pseudoaneurysm with left CCA-ICA bypass using SVG 05/20/12   CHOLECYSTECTOMY     CORONARY ANGIOPLASTY WITH STENT PLACEMENT  september 9th 2017   CYSTOSCOPY W/ URETERAL STENT PLACEMENT Left 08/17/2017   Procedure: CYSTOSCOPY WITH RETROGRADE PYELOGRAM/URETERAL STENT PLACEMENT;  Surgeon: Festus Aloe, MD;  Location: [facility];  Service: Urology;  Laterality: Left;   CYSTOSCOPY W/ URETERAL STENT PLACEMENT Left 09/16/2017   Procedure: CYSTOSCOPY WITH STENT REPLACEMENT;  Surgeon: Abbie Sons, MD;  Location: [facility];  Service: Urology;  Laterality: Left;   CYSTOSCOPY/RETROGRADE/URETEROSCOPY/STONE EXTRACTION WITH BASKET Left 09/16/2017   Procedure: CYSTOSCOPY/RETROGRADE/URETEROSCOPY/STONE EXTRACTION WITH BASKET;  Surgeon: Abbie Sons, MD;  Location: [facility];  Service: Urology;  Laterality: Left;   EXTERNAL FIXATION LEG Left 02/24/2021   Procedure: EXTERNAL FIXATION  ANKLE;  Surgeon: Rod Can, MD;  Location: [place];  Service: Orthopedics;  Laterality: Left;   EXTERNAL FIXATION REMOVAL Left 02/26/2021   Procedure: REMOVAL EXTERNAL FIXATION LEG;  Surgeon: Shona Needles, MD;  Location: [place];  Service: Orthopedics;  Laterality: Left;   EYE SURGERY Bilateral    cataract extraction   I & D EXTREMITY Left 02/24/2021   Procedure: IRRIGATION AND DEBRIDEMENT EXTREMITY;  Surgeon: Rod Can, MD;  Location: [facility];  Service: Orthopedics;  Laterality: Left;   I & D EXTREMITY Left 12/21/2021   Procedure: IRRIGATION AND DEBRIDEMENT LEFT ANKLE;   Surgeon: Shona Needles, MD;  Location: [place];  Service: Orthopedics;  Laterality: Left;   I & D EXTREMITY Left 05/03/2022   Procedure: DEBRIDEMENT OF LEFT ANKLE INFECTION;  Surgeon: Shona Needles, MD;  Location: [place];  Service: Orthopedics;  Laterality: Left;   IR FLUORO GUIDE CV LINE LEFT  12/27/2021   IR FLUORO GUIDE CV LINE RIGHT  10/12/2021   IR REMOVAL TUN CV CATH W/O FL  11/22/2021   IR US GUIDE VASC ACCESS LEFT  12/27/2021   IR US GUIDE VASC ACCESS RIGHT  10/12/2021   KYPHOPLASTY N/A 02/03/2018   Procedure: [id number];  Surgeon: Hessie Knows, MD;  Location: [facility];  Service: Orthopedics;  Laterality: N/A;   MASTECTOMY Right 1982   ORIF ANKLE FRACTURE Left 02/26/2021   Procedure: OPEN REDUCTION INTERNAL FIXATION (ORIF) ANKLE FRACTURE;  Surgeon: Shona Needles, MD;  Location: [facility];  Service: Orthopedics;  Laterality: Left;   PACEMAKER INSERTION Left 07/03/2016   Procedure: INSERTION PACEMAKER;  Surgeon: Isaias Cowman, MD;  Location: [facility];  Service: Cardiovascular;  Laterality: Left;   SKIN CANCER EXCISION     TONSILLECTOMY     TUBAL LIGATION     VISCERAL ANGIOGRAPHY N/A 11/03/2019   Procedure: VISCERAL ANGIOGRAPHY;  Surgeon: Katha Cabal, MD;  Location: [place] [facility];  Service: Cardiovascular;  Laterality: N/A;   HPI:  This is an 88 yo woman with hx HTN, CAD, DM2 who presents with acute onset RUE weakness at 9:30AM. She was unable to walk when working with PT and EMS was called. Patient tells me that she is not having any abnormal sx and she is not weak anywhere but she only has a flicker of movement in her fingers on R with no movement anti-gravity. NIHSS = 6. CT head no acute finding on personal review. Not a TNK candidate 2/2 presentation outside the window. CTA and CTP were performed which showed:     1. No emergent large vessel occlusion.  2. No infarct core identified. Scattered penumbra is identified in  both cerebral and cerebellar hemispheres,  favored artifactual. MRI  may be considered for better evaluation.  3. Bulky plaque in the proximal right internal carotid artery  resulting in approximately 65-70% stenosis.  4. Mild plaque in the left common and internal carotid arteries  without hemodynamically significant stenosis or occlusion. The  previously seen pseudoaneurysm arising from the proximal left ICA on  the study from 2013 is no longer present.  5. Occluded left external carotid artery.  6. Severe stenosis of the origin of the non dominant left vertebral  artery which is diminutive in caliber throughout the remainder of  the neck. Patent dominant right vertebral artery.  7. Fetal left PCA with severe stenosis of the PCOM/P2 junction.  8. Mild plaque in the intracranial ICAs without hemodynamically  significant stenosis or occlusion.  9. Severe stenosis of the left subclavian artery.    Assessment / Plan / Recommendation  Clinical Impression  ST received consult as pt failed [facility] Swallow Screen as she stopped drinking and had a cough when consuming thin liquids via straw. Pt's cough is commensurate  with recent results of MBSS when consuming thin liquids via straw. During today's bedside swallow evaluation, pt presented with adequate oropharyngeal abilities when consuming thin liquids via cup, puree and solids. Pt's NT was in her room with education provided on recommendation of NO STRAW. Secure chat sent to pt's treatment team with this recommendation. At this time, pt appears appropriate for diet upgrade to regular with thin liquids VIA CUP (no straw) and medicine whole with thin liquids. Skilled ST intervention is not indicated at this time. SLP Visit Diagnosis: Dysphagia, pharyngeal phase (R13.13)    Aspiration Risk  Mild aspiration risk    Diet Recommendation Regular;Thin liquid   Liquid Administration via: Cup;No straw Medication Administration: Whole meds with liquid Supervision: Patient able to self feed;Intermittent  supervision to cue for compensatory strategies Compensations: Minimize environmental distractions;Small sips/bites;Slow rate Postural Changes: Seated upright at 90 degrees;Remain upright for at least 30 minutes after po intake    Other  Recommendations Oral Care Recommendations: Oral care BID    Recommendations for follow up therapy are one component of a multi-disciplinary discharge planning process, led by the attending physician.  Recommendations may be updated based on patient status, additional functional criteria and insurance authorization.  Follow up Recommendations No SLP follow up      Assistance Recommended at Discharge    Functional Status Assessment Patient has not had a recent decline in their functional status    Swallow Study   General Date of Onset: 08/19/22 HPI: This is an 88 yo woman with hx HTN, CAD, DM2 who presents with acute onset RUE weakness at 9:30AM. She was unable to walk when working with PT and EMS was called. Patient tells me that she is not having any abnormal sx and she is not weak anywhere but she only has a flicker of movement in her fingers on R with no movement anti-gravity. NIHSS = 6. CT head no acute finding on personal review. Not a TNK candidate 2/2 presentation outside the window. CTA and CTP were performed which showed:     1. No emergent large vessel occlusion.  2. No infarct core identified. Scattered penumbra is identified in  both cerebral and cerebellar hemispheres, favored artifactual. MRI  may be considered for better evaluation.  3. Bulky plaque in the proximal right internal carotid artery  resulting in approximately 65-70% stenosis.  4. Mild plaque in the left common and internal carotid arteries  without hemodynamically significant stenosis or occlusion. The  previously seen pseudoaneurysm arising from the proximal left ICA on  the study from 2013 is no longer present.  5. Occluded left external carotid artery.  6. Severe stenosis of the origin  of the non dominant left vertebral  artery which is diminutive in caliber throughout the remainder of  the neck. Patent dominant right vertebral artery.  7. Fetal left PCA with severe stenosis of the PCOM/P2 junction.  8. Mild plaque in the intracranial ICAs without hemodynamically  significant stenosis or occlusion.  9. Severe stenosis of the left subclavian artery. Type of Study: Bedside Swallow Evaluation Previous Swallow Assessment: MBSS 08/16/2022 Diet Prior to this Study: NPO (failed [employer] Screen) Temperature Spikes Noted: No Respiratory Status: Nasal cannula (2 liters) History of Recent Intubation: No Behavior/Cognition: Alert;Cooperative;Pleasant mood Oral Cavity Assessment: Within Functional Limits Oral Care Completed by SLP: No Oral Cavity - Dentition: Dentures, top;Dentures, bottom Vision: Functional for self-feeding Self-Feeding Abilities: Able to feed self;Needs assist;Needs set up Patient Positioning: Upright in bed Baseline Vocal Quality: Normal Volitional  Cough: Strong Volitional Swallow: Able to elicit    Oral/Motor/Sensory Function Overall Oral Motor/Sensory Function: Within functional limits   Ice Chips Ice chips: Not tested   Thin Liquid Thin Liquid: Within functional limits Presentation: Cup;Self Fed (recent MBSS recommended no straw d/t deep penetration with straw; penetrates didn't clear)    Nectar Thick Nectar Thick Liquid: Not tested   Honey Thick Honey Thick Liquid: Not tested   Puree Puree: Within functional limits Presentation: Spoon   Solid     Solid: Within functional limits Presentation: Self Fed     Cassandra [name] Nail, M.S., CCC-SLP, Mining engineer Certified Brain Injury [place]  [place] Office [phone] Ascom [phone] Fax [phone]

## 2022-08-20 NOTE — ED Notes (Signed)
Secretary setting up transport from ED14 to room 114.

## 2022-08-20 NOTE — Evaluation (Signed)
Occupational Therapy Evaluation Patient Details Name: Cassandra [name] MRN: [id number] DOB: 1933-02-27 Today's Date: 08/20/2022   History of Present Illness 88 y/o female presented to ED on 08/19/22 for weakness and inability to walk. CT with no acute findings. MRI pending. PMH: T2DM, CAD, sleep apnea, hx of breast cancer, hx of MI, neuromuscular disorder, COPD, hx of visual hallucinations, CKD, HTN   Clinical Impression   Cassandra [name] was seen for OT evaluation this date. Prior to hospital admission, pt was limited household ambulator with RW. Pt lives with spouse in level entry apartment. Pt presents to acute OT demonstrating impaired ADL performance and functional mobility 2/2 decreased activity tolerance, impaired use of non-dominant RUE, and functional balance deficits. Pt currently requires MIN A sup>sit. MIN cues doff L socks seated EOB, MOD A don; MIN A don/doff gown in sitting - assist for RUE mgmt. Of note pt does not initiate use of non-dominant R hand to assist with dressing however given step by step verbal cues pt uses RUE to assist. Suspect R sided inattention. MIN A + RW for BSC t/f, incontinent in standing and completed on BSC. SETUP pericare in sitting. SETUP for meal, use of dominant L hand to eat. Pt would benefit from skilled OT to address noted impairments and functional limitations (see below for any additional details). Upon hospital discharge, recommend HHOT to maximize pt safety and return to PLOF.   Recommendations for follow up therapy are one component of a multi-disciplinary discharge planning process, led by the attending physician.  Recommendations may be updated based on patient status, additional functional criteria and insurance authorization.   Follow Up Recommendations  Home health OT     Assistance Recommended at Discharge Frequent or constant Supervision/Assistance  Patient can return home with the following A little help with walking and/or transfers;A  little help with bathing/dressing/bathroom;Help with stairs or ramp for entrance    Functional Status Assessment  Patient has had a recent decline in their functional status and demonstrates the ability to make significant improvements in function in a reasonable and predictable amount of time.  Equipment Recommendations  BSC/3in1    Recommendations for Other Services       Precautions / Restrictions Precautions Precautions: Fall Restrictions Weight Bearing Restrictions: No      Mobility Bed Mobility Overal bed mobility: Needs Assistance Bed Mobility: Supine to Sit     Supine to sit: Min assist          Transfers Overall transfer level: Needs assistance Equipment used: Rolling walker (2 wheels) Transfers: Sit to/from Stand, Bed to chair/wheelchair/BSC Sit to Stand: Min assist     Step pivot transfers: Min assist     General transfer comment: asssit to place R hand on walker and for lift off      Balance Overall balance assessment: Needs assistance Sitting-balance support: No upper extremity supported, Feet supported Sitting balance-Leahy Scale: Good     Standing balance support: Bilateral upper extremity supported Standing balance-Leahy Scale: Fair                             ADL either performed or assessed with clinical judgement   ADL Overall ADL's : Needs assistance/impaired                                       General ADL Comments: MIN A +  RW for BSC t/f, SETUP pericare in sitting. MIN cues doff L socks seated EOB, MOD A don. MIN A don/doff gown in sitting - assist for RUE mgmt. SETUP for meal, use of dominant L hand to eat.      Pertinent Vitals/Pain Pain Assessment Pain Assessment: No/denies pain     Hand Dominance Left   Extremity/Trunk Assessment Upper Extremity Assessment Upper Extremity Assessment: RUE deficits/detail RUE Deficits / Details: 3/5 grossly, suspect R inattention RUE Coordination: decreased  fine motor   Lower Extremity Assessment Lower Extremity Assessment: Generalized weakness       Communication Communication Communication: HOH   Cognition Arousal/Alertness: Awake/alert Behavior During Therapy: Flat affect Overall Cognitive Status: Impaired/Different from baseline Area of Impairment: Following commands, Problem solving, Safety/judgement                       Following Commands: Follows one step commands consistently Safety/Judgement: Decreased awareness of safety, Decreased awareness of deficits   Problem Solving: Difficulty sequencing, Requires verbal cues, Requires tactile cues                  Home Living Family/patient expects to be discharged to:: Private residence Living Arrangements: Spouse/significant other Available Help at Discharge: Family;Available 24 hours/day Type of Home: Apartment Home Access: Level entry     Home Layout: One level     Bathroom Shower/Tub: Teacher, early years/pre: Standard     Home Equipment: Shower seat;Wheelchair Probation officer (4 wheels);Rolling Walker (2 wheels);Cane - single point;Grab bars - toilet;BSC/3in1;Grab bars - tub/shower          Prior Functioning/Environment Prior Level of Function : Needs assist             Mobility Comments: RW in home and w/c for community mobility, husband reports MIN A to stand from chair baseline          OT Problem List: Decreased strength;Decreased range of motion;Decreased activity tolerance;Impaired balance (sitting and/or standing);Decreased safety awareness      OT Treatment/Interventions: Self-care/ADL training;Therapeutic exercise;Energy conservation;DME and/or AE instruction;Therapeutic activities;Balance training;Patient/family education    OT Goals(Current goals can be found in the care plan section) Acute Rehab OT Goals Patient Stated Goal: to go home OT Goal Formulation: With patient/family Time For Goal Achievement:  09/03/22 Potential to Achieve Goals: Good ADL Goals Pt Will Perform Grooming: with modified independence;standing Pt Will Perform Lower Body Dressing: with set-up;with supervision;sit to/from stand Pt Will Transfer to Toilet: with modified independence;ambulating;regular height toilet  OT Frequency: Min 3X/week    Co-evaluation              AM-PAC OT "6 Clicks" Daily Activity     Outcome Measure Help from another person eating meals?: A Little Help from another person taking care of personal grooming?: A Little Help from another person toileting, which includes using toliet, bedpan, or urinal?: A Little Help from another person bathing (including washing, rinsing, drying)?: A Little Help from another person to put on and taking off regular upper body clothing?: A Little Help from another person to put on and taking off regular lower body clothing?: A Lot 6 Click Score: 17   End of Session Equipment Utilized During Treatment: Rolling walker (2 wheels)  Activity Tolerance: Patient tolerated treatment well Patient left: in chair;with call bell/phone within reach;with chair alarm set;with family/visitor present  OT Visit Diagnosis: Other abnormalities of gait and mobility (R26.89);Muscle weakness (generalized) (M62.81)  Time: 0447-1580 OT Time Calculation (min): 40 min Charges:  OT General Charges $OT Visit: 1 Visit OT Evaluation $OT Eval Moderate Complexity: 1 Mod OT Treatments $Self Care/Home Management : 23-37 mins  Dessie Coma, M.S. OTR/L  08/20/22, 10:42 AM  ascom [phone]

## 2022-08-20 NOTE — Progress Notes (Signed)
PROGRESS NOTE    Cassandra [name]  IWL:[id number] DOB: 10/25/1932 DOA: 08/19/2022 PCP: Idelle Crouch, MD  114A/114A-AA  LOS: 0 days   Brief hospital course:   Assessment & Plan: Cassandra [name] is a 88 y.o. female with PMH of  COPD/chronic hypoxic RF on 2 L, left ankle osteomyelitis on IV antibiotics, IDDM-2, CAD, diastolic CHF, breast cancer s/p right mastectomy, neuromuscular disorder, hypothyroidism, symptomatic bradycardia s/p PPM and hypertension presenting with acute onset right-sided weakness.    Acute stroke --dx clinically by neuro.  Unable to perform MRI due to pacemaker. --TTE showed no intracardiac clot but did show LA dilation.  --Head CT at 24 hrs stable with no acute intracranial process.  Pacemaker was interrogated today and showed she had multiple episodes (up to multiple hours) of AF in 2022, but none recently.  --per Neuro, "While given no recent episodes which means this was not the etiology of the present stroke she will require anticoagulation for stroke prevention going forward. CHADS2VASc is 8 giving her a very high risk of stroke if not anticoagulated. She will need to wait till later this week since she has a small acute infarct by exam and would therefore be at risk of hemorrhagic conversion of the stroke if started on DOAC right away." Plan: --repeat CT head as outpatient this Friday AM.  Will call pt to start Eliquis if no hemorrhagic conversion  - D/c aspirin - Continue plavix per cardiology given history of cardiac stent placement. She should be continued on plavix when she is started on eliquis - No indication for statin, LDL is at goal without --outpatient neuro f/u   Hypertensive urgency:  --BP elevated to 190/114. -Permissive hypertension to 220/110 --IV labetalol PRN   Chronic diastolic CHF:  Appears euvolemic. Prior TTE with grade 2 DD.  On p.o. Lasix 20 mg at home. -Hold diuretics for permissive hypertension   History of  SSS/symptomatic bradycardia s/p PPM   AKI on CKD-4:  Baseline Cr seems to range from 1.6-2.0. --Cr 2.39 on presentation.   Chronic COPD:  Appears stable.   IDDM-2:  --has not needed much insulin since presentation --ACHS and SSI for now   Chronic osteomyelitis of left ankle/left foot:  --cont home abx   Hypothyroidism --cont Syndhroid   History of breast cancer status post right mastectomy -Outpatient follow-up.   DVT prophylaxis: Heparin SQ Code Status: Full code  Family Communication: husband updated at bedside today Level of care: Telemetry Medical Dispo:   The patient is from: home Anticipated d/c is to: home Anticipated d/c date is: tomorrow   Subjective and Interval History:  Pt reported right-sided strength improved.  Repeat CT head today found no acute finding.    Objective: Vitals:   08/20/22 0611 08/20/22 0756 08/20/22 1108 08/20/22 1648  BP: (!) 170/72 (!) 184/112 (!) 189/85 (!) 162/77  Pulse: 80 80 78 77  Resp: '17 20 19 16  '$ Temp:  (!) 97.5 F (36.4 C) 97.8 F (36.6 C) 97.9 F (36.6 C)  TempSrc:      SpO2: 97% 96% 96% 100%    Intake/Output Summary (Last 24 hours) at 08/20/2022 1842 Last data filed at 08/20/2022 1508 Gross per 24 hour  Intake 760 ml  Output 200 ml  Net 560 ml   There were no vitals filed for this visit.  Examination:   Constitutional: NAD, AAOx3, sitting up HEENT: conjunctivae and lids normal, EOMI CV: No cyanosis.   RESP: normal respiratory effort, on RA  Data Reviewed: I have personally reviewed labs and imaging studies  Time spent: 50 minutes  Enzo Bi, MD [facility] If 7PM-7AM, please contact night-coverage 08/20/2022, 6:42 PM

## 2022-08-20 NOTE — Plan of Care (Deleted)
Neurology plan of care  This is an 88 yo woman with hx HTN, CAD, DM2 who presents with acute onset RUE weakness yesterday c/f acute ischemic stroke. She was outside the window for TNK and did not undergo intervention 2/2 no LVO on CTA.  MRI brain unable to be performed 2/2 pacemaker (compatible with MRI but unable to be done at [facility]). Will order head CT now (24 hrs post-stroke) to assess for evolving ischemia. TTE showed no intracardiac clot but did show LA dilation.   CTA/CTP  1. No emergent large vessel occlusion. 2. No infarct core identified. Scattered penumbra is identified in both cerebral and cerebellar hemispheres, favored artifactual. MRI may be considered for better evaluation. 3. Bulky plaque in the proximal right internal carotid artery resulting in approximately 65-70% stenosis. 4. Mild plaque in the left common and internal carotid arteries without hemodynamically significant stenosis or occlusion. 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 5. Occluded left external carotid artery. 6. Severe stenosis of the origin of the non dominant left vertebral artery which is diminutive in caliber throughout the remainder of the neck. Patent dominant right vertebral artery. 7. Fetal left PCA with severe stenosis of the PCOM/P2 junction. 8. Mild plaque in the intracranial ICAs without hemodynamically significant stenosis or occlusion. 9. Severe stenosis of the left subclavian artery.  Personally reviewed; I agree with radiology interpretation  Stroke Labs     Component Value Date/Time   CHOL 110 08/20/2022 0403   TRIG 74 08/20/2022 0403   HDL 56 08/20/2022 0403   CHOLHDL 2.0 08/20/2022 0403   VLDL 15 08/20/2022 0403   LDLCALC 39 08/20/2022 0403    Lab Results  Component Value Date/Time   HGBA1C 7.0 (H) 05/03/2022 05:46 AM   Final recommendations: - Repeat head CT at 24 hrs, ordered. Will touch base  with Dr. Billie Ruddy once completed, can likely be discharged after unless she has other medical indication for continued hospitalization - ASA '81mg'$  daily + plavix '75mg'$  daily x90 days f/b ASA '81mg'$  daily monotherapy after that (90 days 2/2 severe stenosis of the PCOM/P2 junction intracranially) - No indication for statin, LDL is at goal without - Pacemaker should be interrogated inpatient if possible or outpatient promptly to r/o underlying pAF esp given LA dilation on TTE - [facility] PT/OT recommended - I will arrange outpatient neurology f/u - I will touch base with Dr. Billie Ruddy after head CT is completed, otherwise neurology will be available prn for questions going forward  Su Monks, MD [facility] [phone]  If 7pm- 7am, please page neurology on call as listed in [place].

## 2022-08-20 NOTE — Progress Notes (Addendum)
We were asked by Dr. Enzo Bi to interrogate this patient's dual-chamber Medtronic PPM initially placed for sick sinus syndrome in 2017 to evaluate for possible atrial fibrillation as a cause of her right upper extremity weakness/concern for acute ischemic stroke.  I interrogated her device that revealed multiple hours long episodes of atrial fibrillation, most recently occurring in 2022.  The patient has a reported history of atrial flutter at she does not remember much about, unclear if it was due to a reversible cause, but regardless has not been on any DOAC  prior to this admission.  I placed the device interrogation summary in her paper chart.  Discussed these finds with Dr. Billie Ruddy and Dr. Su Monks with neurology.  She recommends to hold off on anticoagulation for at least 5 days from symptom onset which was on 12/18 to reduce the risk of hemorrhagic conversion. She has a CHA2DS2-VASc of 3 (age, sex, CHF, hx CVA, CAD, DM2).    Formal consult note to follow tomorrow 12/20.  Tristan Schroeder, PA-C  [facility] Cardiology

## 2022-08-20 NOTE — Progress Notes (Signed)
*  PRELIMINARY RESULTS* Echocardiogram 2D Echocardiogram has been performed.  Cassandra [name] 08/20/2022, 8:57 AM

## 2022-08-20 NOTE — Progress Notes (Signed)
Neurology progress note  This is an 88 yo woman with hx HTN, CAD, DM2 who presents with acute onset RUE weakness yesterday c/f acute ischemic stroke. She was outside the window for TNK and did not undergo intervention 2/2 no LVO on CTA.    S: Patient is neglecting the RUE but strength is improved. Persistent R facial droop. MRI brain unable to be performed 2/2 pacemaker (compatible with MRI but unable to be done at [facility]). Head CT at 24 hrs stable with no acute intracranial process (personal review). Pacemaker was interrogated and showed she had multiple episodes (up to multiple hours) of AF in 2022, but none recently.  TTE showed no intracardiac clot but did show LA dilation.   CTA/CTP  1. No emergent large vessel occlusion. 2. No infarct core identified. Scattered penumbra is identified in both cerebral and cerebellar hemispheres, favored artifactual. MRI may be considered for better evaluation. 3. Bulky plaque in the proximal right internal carotid artery resulting in approximately 65-70% stenosis. 4. Mild plaque in the left common and internal carotid arteries without hemodynamically significant stenosis or occlusion. 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 5. Occluded left external carotid artery. 6. Severe stenosis of the origin of the non dominant left vertebral artery which is diminutive in caliber throughout the remainder of the neck. Patent dominant right vertebral artery. 7. Fetal left PCA with severe stenosis of the PCOM/P2 junction. 8. Mild plaque in the intracranial ICAs without hemodynamically significant stenosis or occlusion. 9. Severe stenosis of the left subclavian artery.  Personally reviewed; I agree with radiology interpretation  Stroke Labs     Component Value Date/Time   CHOL 110 08/20/2022 0403   TRIG 74 08/20/2022 0403   HDL 56 08/20/2022 0403   CHOLHDL 2.0 08/20/2022 0403   VLDL  15 08/20/2022 0403   LDLCALC 39 08/20/2022 0403    Lab Results  Component Value Date/Time   HGBA1C 7.0 (H) 05/03/2022 05:46 AM    O:   Vitals:   08/20/22 0756 08/20/22 1108  BP: (!) 184/112 (!) 189/85  Pulse: 80 78  Resp: 20 19  Temp: (!) 97.5 F (36.4 C) 97.8 F (36.6 C)  SpO2: 96% 96%   Physical Exam Gen: alert and oriented to self, hospital, age, but not month HEENT: Atraumatic, normocephalic;mucous membranes moist; oropharynx clear, tongue without atrophy or fasciculations. Neck: Supple, trachea midline. Resp: CTAB, no w/r/r CV: RRR, no m/g/r; nml S1 and S2. 2+ symmetric peripheral pulses. Abd: soft/NT/ND; nabs x 4 quad Extrem: Nml bulk; no cyanosis, clubbing, or edema.   Neuro: *MS: A&O x4. Follows multi-step commands.  *Speech: fluid, nondysarthric, able to name and repeat *CN:    I: Deferred   II,III: PERRLA, VFF by confrontation, optic discs unable to be visualized 2/2 pupillary constriction   III,IV,VI: EOMI w/o nystagmus, no ptosis   V: Sensation intact from V1 to V3 to LT   VII: Eyelid closure was full.  Smile symmetric.   VIII: Hearing intact to voice   IX,X: Voice normal, palate elevates symmetrically    XI: SCM/trap 5/5 bilat   XII: Tongue protrudes midline, no atrophy or fasciculations    *Motor:   Normal bulk.  No tremor, rigidity or bradykinesia. No drift LUE or BLE. When asked to pick up her RUE she does not respond or lifts her LUE but when I hold her RUE she is able to hold it antigravity with drift (  not to bed). *Sensory: SILT. Extinguishes to DSS on RLE. *Coordination:  FNF intact on L *Reflexes:  1+ and symmetric throughout without clonus; toes down-going bilat *Gait: deferred   NIHSS   1a Level of Conscious.: 0 1b LOC Questions: 0 1c LOC Commands: 0 2 Best Gaze: 0 3 Visual: 0 4 Facial Palsy: 1 5a Motor Arm - left: 1 5b Motor Arm - Right: 1 6a Motor Leg - Left: 0 6b Motor Leg - Right: 0 7 Limb Ataxia: 0 8 Sensory: 0 9 Best  Language: 0 10 Dysarthria: 0 11 Extinct. and Inatten.: 1   TOTAL: 4   A/P: This is an 88 yo woman with hx HTN, CAD, DM2 who presents with acute onset RUE weakness at 9:30AM c/f acute ischemic stroke. She was outside the window for TNK and did not undergo intervention 2/2 no LVO on CTA.  MRI brain unable to be performed 2/2 pacemaker (compatible with MRI but unable to be done at [facility]). Head CT at 24 hrs stable with no acute intracranial process (personal review). Clinically however she has had an ischemic stroke given her persistent (through improved) R hemineglect and R facial droop. Pacemaker was interrogated and showed she had multiple episodes (up to multiple hours) of AF in 2022, but none recently. While given no recent episodes which means this was not the etiology of the present stroke she will require anticoagulation for stroke prevention going forward. CHADS2VASc is 8 giving her a very high risk of stroke if not anticoagulated. She will need to wait till later this week since she has a small acute infarct by exam and would therefore be at risk of hemorrhagic conversion of the stroke if started on DOAC right away.   - I do not feel that she is a candidate to start anticoagulation as an outpatient without prior head CT if symptoms are stable or improved because she is neglecting and therefore unaware that she has any neurologic deficits. Dr. Billie Ruddy will schedule head CT outpatient Friday AM (day 4 from stroke) and call patient to start eliquis if there is no hemorrhagic conversion by CT - D/c aspirin - Continue plavix per cardiology given history of cardiac stent placement. She should be continued on plavix when she is started on eliquis - No indication for statin, LDL is at goal without - [facility] PT/OT recommended - I will arrange outpatient neurology f/u - Neurology will be available prn for questions going forward  Su Monks, MD [facility] [phone]  If 7pm- 7am, please page  neurology on call as listed in [place].

## 2022-08-20 NOTE — Evaluation (Signed)
Physical Therapy Evaluation Patient Details Name: Cassandra [name] MRN: [id number] DOB: 04-14-1933 Today's Date: 08/20/2022  History of Present Illness  88 y/o female presented to ED on 08/19/22 for weakness and inability to walk. CT with no acute findings. MRI pending. PMH: T2DM, CAD, sleep apnea, hx of breast cancer, hx of MI, neuromuscular disorder, COPD, hx of visual hallucinations, CKD, HTN  Clinical Impression  Patient admitted with the above. PTA, patient lives with husband who assists with mobility with use of RW. She is typically a household ambulator with RW and uses w/c for community mobility. Patient presents with R inattention, weakness, impaired balance, decreased activity tolerance, and decreased insight into deficits. Patient requires minA for sit to stand from recliner and ambulation with RW as patient tends to require assist for RW management and balance due to R inattention. Educated patient and husband on safety concerns with R inattention and necessary close supervision at discharge for safety as patient is at a high fall risk, both verbalized understanding. Patient will benefit from skilled PT services during acute stay to address listed deficits. Recommend HHPT at discharge to maximize functional independence and safety.        Recommendations for follow up therapy are one component of a multi-disciplinary discharge planning process, led by the attending physician.  Recommendations may be updated based on patient status, additional functional criteria and insurance authorization.  Follow Up Recommendations Home health PT      Assistance Recommended at Discharge Frequent or constant Supervision/Assistance  Patient can return home with the following  A lot of help with walking and/or transfers;A lot of help with bathing/dressing/bathroom;Assistance with cooking/housework;Direct supervision/assist for medications management;Direct supervision/assist for financial  management;Assist for transportation    Equipment Recommendations None recommended by PT  Recommendations for Other Services       Functional Status Assessment Patient has had a recent decline in their functional status and demonstrates the ability to make significant improvements in function in a reasonable and predictable amount of time.     Precautions / Restrictions Precautions Precautions: Fall Restrictions Weight Bearing Restrictions: No      Mobility  Bed Mobility               General bed mobility comments: in recliner at beginning and end of session    Transfers Overall transfer level: Needs assistance Equipment used: Rolling [name] (2 wheels) Transfers: Sit to/from Stand Sit to Stand: Min assist           General transfer comment: assist to place R hand on RW and boost into standing    Ambulation/Gait Ambulation/Gait assistance: Min assist Gait Distance (Feet): 60 Feet Assistive device: Rolling Skylee Baird (2 wheels) Gait Pattern/deviations: Step-through pattern, Decreased stride length Gait velocity: decreased     General Gait Details: assist for RW management as patient not pushing RW on R side due to UE weakness/inattention. Requires increased time and slow processing with turning and backing to recliner  Stairs            Wheelchair Mobility    Modified Rankin (Stroke Patients Only)       Balance Overall balance assessment: Needs assistance Sitting-balance support: No upper extremity supported, Feet supported Sitting balance-Leahy Scale: Good     Standing balance support: Bilateral upper extremity supported Standing balance-Leahy Scale: Fair                               Pertinent Vitals/Pain  Pain Assessment Pain Assessment: No/denies pain    Home Living Family/patient expects to be discharged to:: Private residence Living Arrangements: Spouse/significant other Available Help at Discharge: Family;Available 24  hours/day Type of Home: Apartment Home Access: Level entry       Home Layout: One level Home Equipment: Shower seat;Wheelchair Probation officer (4 wheels);Rolling Garhett Bernhard (2 wheels);Cane - single point;Grab bars - toilet;BSC/3in1;Grab bars - tub/shower      Prior Function Prior Level of Function : Needs assist             Mobility Comments: RW in home and w/c for community mobility, husband reports MIN A to stand from chair baseline       Hand Dominance   Dominant Hand: Left    Extremity/Trunk Assessment   Upper Extremity Assessment Upper Extremity Assessment: Defer to OT evaluation RUE Deficits / Details: 3/5 grossly, suspect R inattention RUE Coordination: decreased fine motor    Lower Extremity Assessment Lower Extremity Assessment: Generalized weakness (no obvious coordination deficits)    Cervical / Trunk Assessment Cervical / Trunk Assessment: Kyphotic  Communication   Communication: HOH  Cognition Arousal/Alertness: Awake/alert Behavior During Therapy: Flat affect Overall Cognitive Status: Impaired/Different from baseline Area of Impairment: Following commands, Problem solving, Safety/judgement                       Following Commands: Follows one step commands consistently Safety/Judgement: Decreased awareness of safety, Decreased awareness of deficits   Problem Solving: Difficulty sequencing, Requires verbal cues, Requires tactile cues General Comments: cues to bring attention to R side as patient tends to leave R UE behind her or on RW prior to sitting        General Comments      Exercises     Assessment/Plan    PT Assessment Patient needs continued PT services  PT Problem List Decreased strength;Decreased activity tolerance;Decreased balance;Decreased mobility;Decreased coordination;Decreased cognition;Decreased knowledge of use of DME;Decreased safety awareness;Decreased knowledge of precautions       PT Treatment Interventions  DME instruction;Gait training;Functional mobility training;Therapeutic activities;Therapeutic exercise;Balance training;Patient/family education    PT Goals (Current goals can be found in the Care Plan section)  Acute Rehab PT Goals Patient Stated Goal: to go home PT Goal Formulation: With patient/family Time For Goal Achievement: 09/03/22 Potential to Achieve Goals: Fair    Frequency 7X/week     Co-evaluation               AM-PAC PT "6 Clicks" Mobility  Outcome Measure Help needed turning from your back to your side while in a flat bed without using bedrails?: A Little Help needed moving from lying on your back to sitting on the side of a flat bed without using bedrails?: A Little Help needed moving to and from a bed to a chair (including a wheelchair)?: A Little Help needed standing up from a chair using your arms (e.g., wheelchair or bedside chair)?: A Little Help needed to walk in hospital room?: A Little Help needed climbing 3-5 steps with a railing? : A Lot 6 Click Score: 17    End of Session Equipment Utilized During Treatment: Gait belt Activity Tolerance: Patient tolerated treatment well Patient left: in chair;with call bell/phone within reach;with chair alarm set;with family/visitor present Nurse Communication: Mobility status PT Visit Diagnosis: Unsteadiness on feet (R26.81);Muscle weakness (generalized) (M62.81);Difficulty in walking, not elsewhere classified (R26.2);Other symptoms and signs involving the nervous system (C58.850)    Time: 2774-1287 PT Time Calculation (min) (ACUTE ONLY):  33 min   Charges:   PT Evaluation $PT Eval Moderate Complexity: 1 Mod PT Treatments $Therapeutic Activity: 8-22 mins        Adelle Zachar A. Gilford Rile PT, DPT [facility] - Acute Rehabilitation Services   Shanikia Kernodle A Riyan Haile 08/20/2022, 12:38 PM

## 2022-08-21 DIAGNOSIS — R2990 Unspecified symptoms and signs involving the nervous system: Secondary | ICD-10-CM | POA: Diagnosis not present

## 2022-08-21 LAB — CBC
HCT: 30.4 % — ABNORMAL LOW (ref 36.0–46.0)
Hemoglobin: 9.9 g/dL — ABNORMAL LOW (ref 12.0–15.0)
MCH: 29.3 pg (ref 26.0–34.0)
MCHC: 32.6 g/dL (ref 30.0–36.0)
MCV: 89.9 fL (ref 80.0–100.0)
Platelets: 199 10*3/uL (ref 150–400)
RBC: 3.38 MIL/uL — ABNORMAL LOW (ref 3.87–5.11)
RDW: 18.7 % — ABNORMAL HIGH (ref 11.5–15.5)
WBC: 8.4 10*3/uL (ref 4.0–10.5)
nRBC: 0 % (ref 0.0–0.2)

## 2022-08-21 LAB — BASIC METABOLIC PANEL
Anion gap: 8 (ref 5–15)
BUN: 26 mg/dL — ABNORMAL HIGH (ref 8–23)
CO2: 24 mmol/L (ref 22–32)
Calcium: 8.3 mg/dL — ABNORMAL LOW (ref 8.9–10.3)
Chloride: 108 mmol/L (ref 98–111)
Creatinine, Ser: 2.01 mg/dL — ABNORMAL HIGH (ref 0.44–1.00)
GFR, Estimated: 24 mL/min — ABNORMAL LOW (ref >60)
Glucose, Bld: 125 mg/dL — ABNORMAL HIGH (ref 70–99)
Potassium: 3.6 mmol/L (ref 3.5–5.1)
Sodium: 140 mmol/L (ref 135–145)

## 2022-08-21 LAB — GLUCOSE, CAPILLARY: Glucose-Capillary: 119 mg/dL — ABNORMAL HIGH (ref 70–99)

## 2022-08-21 LAB — MAGNESIUM: Magnesium: 1.9 mg/dL (ref 1.7–2.4)

## 2022-08-21 MED ORDER — IRBESARTAN 150 MG PO TABS
150.0000 mg | ORAL_TABLET | Freq: Every day | ORAL | Status: DC
  Administered 2022-08-21: 150 mg via ORAL
  Filled 2022-08-21: qty 1

## 2022-08-21 MED ORDER — CLOPIDOGREL BISULFATE 75 MG PO TABS: 75.0000 mg | ORAL_TABLET | Freq: Every day | ORAL | 2 refills | Status: AC

## 2022-08-21 MED ORDER — LANTUS SOLOSTAR 100 UNIT/ML ~~LOC~~ SOPN: PEN_INJECTOR | SUBCUTANEOUS | 11 refills | Status: DC

## 2022-08-21 MED ORDER — NOVOLOG FLEXPEN 100 UNIT/ML ~~LOC~~ SOPN: PEN_INJECTOR | SUBCUTANEOUS | 11 refills | Status: DC

## 2022-08-21 MED ORDER — METOPROLOL TARTRATE 25 MG PO TABS
25.0000 mg | ORAL_TABLET | Freq: Two times a day (BID) | ORAL | Status: DC
  Administered 2022-08-21: 25 mg via ORAL
  Filled 2022-08-21: qty 1

## 2022-08-21 NOTE — Consult Note (Signed)
[place] NOTE       Patient ID: Cassandra [name] MRN: [id number] DOB/AGE: 1932/12/01 88 y.o.  Admit date: 08/19/2022 Referring Physician Dr. Enzo Bi Primary Physician Dr. [name] Primary Cardiologist Dr. Ubaldo Glassing Reason for Consultation ?AF on PPM interrogation  HPI: Cassandra [name] is an 9yoF with a PMH of CAD s/p PCI RCA 2017, sick sinus syndrome s/p DC PPM, HTN, HLD, DM2, history of CVA who presented to [facility] ED 08/19/2022 with right-sided weakness and difficulty with speech.  Cardiology was consulted to interrogate her pacemaker for evidence of subclinical atrial fibrillation as a cause of her ischemic stroke.  Patient presented with right-sided weakness and difficulty with speech and continues to have some right hemineglect.  Head CT did not show any LVO, and MRI was unable to be obtained at [facility] due to her pacemaker.  At my time of evaluation she is laying nearly flat in bed on supplemental oxygen.  She is eager to go home.  She thinks her strength and speech have somewhat improved.  She denies any chest pain, palpitations, worsening of her chronic peripheral edema, or any complaints.  Device interrogation revealed multiple hours long episodes of atrial fibrillation noted most recently in October 2022.  No recent episodes of A-fib detected this year.  She tells me she thinks she had a history of atrial flutter in the past, but does not remember much regarding the circumstances, she does not take a DOAC or other anticoagulant currently.  Review of systems complete and found to be negative unless listed above     Past Medical History:  Diagnosis Date   Acquired hypothyroidism 01/06/2014   Anxiety    Arthritis    B12 deficiency 02/15/2014   Benign essential hypertension 01/06/2014   Breast cancer ([place]) 1982   Right breast cancer - chemotherapy   CAD (coronary artery disease), native coronary artery 01/06/2014   Carotid artery calcification    Chronic  airway obstruction, not elsewhere classified 01/06/2014   Chronic mesenteric ischemia (HCC)    s/p SMA stent 11/03/19   Depression    Diabetes mellitus without complication ([place])    Dysrhythmia    aflutter with RVR 06/2016   History of kidney stones 2019   left ureteral stone   Incontinence of urine    Myocardial infarction ([place]) 1982   small infarct   Neuromuscular disorder (HCC)    restless legs   Pacemaker    Presence of permanent cardiac pacemaker 07/2016   Pure hypercholesterolemia 01/06/2014   Renal insufficiency    Skin cancer    Sleep apnea    no longer uses a cpap. Uses oxygen as needed    Past Surgical History:  Procedure Laterality Date   ANKLE FUSION Left 10/10/2021   Procedure: ANKLE FUSION;  Surgeon: Shona Needles, MD;  Location: [place];  Service: Orthopedics;  Laterality: Left;   APPENDECTOMY     APPLICATION OF WOUND VAC Left 02/24/2021   Procedure: APPLICATION OF WOUND VAC;  Surgeon: Rod Can, MD;  Location: [place];  Service: Orthopedics;  Laterality: Left;   AUGMENTATION MAMMAPLASTY Bilateral 1982/redo in 2014   prior mastectomy   CARDIAC CATHETERIZATION N/A 04/23/2016   Procedure: Left Heart Cath and Coronary Angiography;  Surgeon: Corey Skains, MD;  Location: [place] [facility];  Service: Cardiovascular;  Laterality: N/A;   CARDIAC CATHETERIZATION Left 04/23/2016   Procedure: Coronary Stent Intervention;  Surgeon: Yolonda Kida, MD;  Location: [place] [facility];  Service: Cardiovascular;  Laterality: Left;   CAROTID ARTERY ANGIOPLASTY Left 2010   had stent inserted and removed d/t infection. artery from leg inserted in left carotid   CAROTID ENDARTERECTOMY Left 2010   left CEA ~ 2010, s/p excision infected left carotid patch & pseudoaneurysm with left CCA-ICA bypass using SVG 05/20/12   CHOLECYSTECTOMY     CORONARY ANGIOPLASTY WITH STENT PLACEMENT  september 9th 2017   CYSTOSCOPY W/ URETERAL STENT PLACEMENT Left 08/17/2017   Procedure:  CYSTOSCOPY WITH RETROGRADE PYELOGRAM/URETERAL STENT PLACEMENT;  Surgeon: Festus Aloe, MD;  Location: [facility];  Service: Urology;  Laterality: Left;   CYSTOSCOPY W/ URETERAL STENT PLACEMENT Left 09/16/2017   Procedure: CYSTOSCOPY WITH STENT REPLACEMENT;  Surgeon: Abbie Sons, MD;  Location: [facility];  Service: Urology;  Laterality: Left;   CYSTOSCOPY/RETROGRADE/URETEROSCOPY/STONE EXTRACTION WITH BASKET Left 09/16/2017   Procedure: CYSTOSCOPY/RETROGRADE/URETEROSCOPY/STONE EXTRACTION WITH BASKET;  Surgeon: Abbie Sons, MD;  Location: [facility];  Service: Urology;  Laterality: Left;   EXTERNAL FIXATION LEG Left 02/24/2021   Procedure: EXTERNAL FIXATION  ANKLE;  Surgeon: Rod Can, MD;  Location: [place];  Service: Orthopedics;  Laterality: Left;   EXTERNAL FIXATION REMOVAL Left 02/26/2021   Procedure: REMOVAL EXTERNAL FIXATION LEG;  Surgeon: Shona Needles, MD;  Location: [place];  Service: Orthopedics;  Laterality: Left;   EYE SURGERY Bilateral    cataract extraction   I & D EXTREMITY Left 02/24/2021   Procedure: IRRIGATION AND DEBRIDEMENT EXTREMITY;  Surgeon: Rod Can, MD;  Location: [place];  Service: Orthopedics;  Laterality: Left;   I & D EXTREMITY Left 12/21/2021   Procedure: IRRIGATION AND DEBRIDEMENT LEFT ANKLE;  Surgeon: Shona Needles, MD;  Location: [place];  Service: Orthopedics;  Laterality: Left;   I & D EXTREMITY Left 05/03/2022   Procedure: DEBRIDEMENT OF LEFT ANKLE INFECTION;  Surgeon: Shona Needles, MD;  Location: [place];  Service: Orthopedics;  Laterality: Left;   IR FLUORO GUIDE CV LINE LEFT  12/27/2021   IR FLUORO GUIDE CV LINE RIGHT  10/12/2021   IR REMOVAL TUN CV CATH W/O FL  11/22/2021   IR US GUIDE VASC ACCESS LEFT  12/27/2021   IR US GUIDE VASC ACCESS RIGHT  10/12/2021   KYPHOPLASTY N/A 02/03/2018   Procedure: [id number];  Surgeon: Hessie Knows, MD;  Location: [facility];  Service: Orthopedics;  Laterality: N/A;   MASTECTOMY Right 1982   ORIF ANKLE  FRACTURE Left 02/26/2021   Procedure: OPEN REDUCTION INTERNAL FIXATION (ORIF) ANKLE FRACTURE;  Surgeon: Shona Needles, MD;  Location: [place];  Service: Orthopedics;  Laterality: Left;   PACEMAKER INSERTION Left 07/03/2016   Procedure: INSERTION PACEMAKER;  Surgeon: Isaias Cowman, MD;  Location: [facility];  Service: Cardiovascular;  Laterality: Left;   SKIN CANCER EXCISION     TONSILLECTOMY     TUBAL LIGATION     VISCERAL ANGIOGRAPHY N/A 11/03/2019   Procedure: VISCERAL ANGIOGRAPHY;  Surgeon: Katha Cabal, MD;  Location: [place] [facility];  Service: Cardiovascular;  Laterality: N/A;    Medications Prior to Admission  Medication Sig Dispense Refill Last Dose   acetaminophen (TYLENOL) 500 MG tablet Take 500-1,000 mg by mouth every 6 (six) hours as needed for moderate pain.   Unknown at PRN   albuterol (VENTOLIN HFA) 108 (90 Base) MCG/ACT inhaler Inhale 2 puffs into the lungs every 6 (six) hours as needed for wheezing or shortness of breath. 8 g 0 Unknown at PRN   amikacin (AMIKIN) 1 GM/4ML SOLN injection  Inject 800 mg into the vein every Tuesday, Thursday, and Saturday at 6 PM.   08/17/2022 at 0800   amitriptyline (ELAVIL) 10 MG tablet Take 10 mg by mouth at bedtime.   08/18/2022 at 2000   cefOXitin (MEFOXIN) 2 g injection Inject 2 g into the vein every 12 (twelve) hours.   08/19/2022 at 0800   cholecalciferol (VITAMIN D3) 25 MCG (1000 UNIT) tablet Take 1,000 Units by mouth daily.      CLOFAZIMINE PO Take 100 mg by mouth every morning.   08/19/2022 at 0800   desvenlafaxine (PRISTIQ) 50 MG 24 hr tablet Take 50 mg by mouth every evening.   08/18/2022 at 2000   furosemide (LASIX) 20 MG tablet Take 20 mg by mouth daily.   08/19/2022 at 0800   insulin aspart (NOVOLOG FLEXPEN) 100 UNIT/ML FlexPen Inject 4 Units into the skin daily.      insulin glargine (LANTUS SOLOSTAR) 100 UNIT/ML Solostar Pen Inject 8 Units into the skin at bedtime.   08/18/2022 at 2000   levothyroxine (SYNTHROID) 100  MCG tablet Take 100 mcg by mouth every morning.   08/19/2022 at 0800   linezolid (ZYVOX) 600 MG tablet Take 1 tablet (600 mg total) by mouth daily. 30 tablet 5 08/19/2022 at 0800   metFORMIN (GLUCOPHAGE) 500 MG tablet Take 500 mg by mouth 2 (two) times daily with a meal.   08/19/2022 at 0800   metoCLOPramide (REGLAN) 5 MG tablet Take 5 mg by mouth 2 (two) times daily before a meal.   08/19/2022 at 0800   metoprolol tartrate (LOPRESSOR) 25 MG tablet Take 1 tablet (25 mg total) by mouth 2 (two) times daily. 60 tablet 2 08/19/2022 at 0800   Multiple Vitamin (MULTIVITAMIN WITH MINERALS) TABS tablet Take 1 tablet by mouth daily. 30 tablet 0    nitroGLYCERIN (NITROSTAT) 0.4 MG SL tablet Place 1 tablet (0.4 mg total) under the tongue every 5 (five) minutes as needed for chest pain. 90 tablet 0 Unknown at PRN   telmisartan (MICARDIS) 40 MG tablet Take 40 mg by mouth daily.   08/19/2022 at 0800   traZODone (DESYREL) 50 MG tablet Take 50 mg by mouth at bedtime.   08/18/2022 at [place] History   Marital status: Married    Spouse name: [name]   Number of children: 3   Years of education: Not on file   Highest education level: High school graduate  Occupational History   Not on file  Tobacco Use   Smoking status: Former    Packs/day: 0.50    Types: Cigarettes    Start date: 1955    Quit date: 1956    Years since quitting: 68.0   Smokeless tobacco: Never  Vaping Use   Vaping Use: Never used  Substance and Sexual Activity   Alcohol use: Yes    Comment: occasional wine   Drug use: Not Currently   Sexual activity: Not Currently  Other Topics Concern   Not on file  Social History Narrative   ** Merged History Encounter **       Lives with husband and dog    Social Determinants of Health   Financial Resource Strain: Low Risk  (10/04/2018)   Overall Financial Resource Strain (CARDIA)    Difficulty of Paying Living Expenses: Not hard at all  Food Insecurity: No  Food Insecurity (10/04/2018)   Hunger Vital Sign    Worried About [place] in the Last Year:  Never true    Ran Out of Food in the Last Year: Never true  Transportation Needs: No Transportation Needs (10/04/2018)   PRAPARE - Hydrologist (Medical): No    Lack of Transportation (Non-Medical): No  Physical Activity: Unknown (10/04/2018)   Exercise Vital Sign    Days of Exercise per Week: Patient refused    Minutes of Exercise per Session: Patient refused  Stress: Not on file  Social Connections: Unknown (10/04/2018)   Social Connection and Isolation Panel [NHANES]    Frequency of Communication with Friends and Family: Patient refused    Frequency of Social Gatherings with Friends and Family: Patient refused    Attends Religious Services: Patient refused    Active Member of Clubs or Organizations: Patient refused    Attends Archivist Meetings: Patient refused    Marital Status: Patient refused  Intimate Partner Violence: Unknown (10/04/2018)   Humiliation, Afraid, Rape, and Kick questionnaire    Fear of Current or Ex-Partner: Patient refused    Emotionally Abused: Patient refused    Physically Abused: Patient refused    Sexually Abused: Patient refused    Family History  Problem Relation Age of Onset   Prostate cancer Neg Hx    Kidney cancer Neg Hx    Breast cancer Neg Hx       Intake/Output Summary (Last 24 hours) at 08/21/2022 0817 Last data filed at 08/21/2022 0039 Gross per 24 hour  Intake 760 ml  Output 900 ml  Net -140 ml    Vitals:   08/20/22 1957 08/21/22 0036 08/21/22 0500 08/21/22 0801  BP: 128/71 (!) 154/80 (!) 175/106 (!) 175/68  Pulse: 81 74 75 76  Resp: '18 16 16 16  '$ Temp: 98.1 F (36.7 C) 97.9 F (36.6 C) (!) 96 F (35.6 C) (!) 97.5 F (36.4 C)  TempSrc:  Oral    SpO2: 97% 100% 96% 97%    PHYSICAL EXAM General: Elderly and frail-appearing Caucasian female, well nourished, in no acute distress.  Lying nearly  flat in bed HEENT:  Normocephalic and atraumatic. Neck:  No JVD.  Lungs: Normal respiratory effort on oxygen by nasal cannula. Clear bilaterally to auscultation. No wheezes, crackles, rhonchi.  Heart: HRRR . Normal S1 and S2 without gallops or murmurs.  Abdomen: Non-distended appearing.  Msk: Normal strength and tone for age. Extremities: Warm and well perfused. No clubbing, cyanosis.  Chronic venous stasis changes with hyperpigmentation and woody appearance with trace edema bilaterally.  Neuro: Alert and oriented X 3. Psych:  Answers questions appropriately.  But is a limited historian  Labs: Basic Metabolic Panel: Recent Labs    08/20/22 0403 08/21/22 0523  NA 140 140  K 3.9 3.6  CL 108 108  CO2 24 24  GLUCOSE 116* 125*  BUN 27* 26*  CREATININE 2.27* 2.01*  CALCIUM 8.7* 8.3*  MG  --  1.9   Liver Function Tests: Recent Labs    08/19/22 1501  AST 41  ALT 29  ALKPHOS 129*  BILITOT 0.6  PROT 7.0  ALBUMIN 2.6*   No results for input(s): "LIPASE", "AMYLASE" in the last 72 hours. CBC: Recent Labs    08/19/22 1426 08/20/22 0403 08/21/22 0523  WBC 8.4 10.0 8.4  NEUTROABS 5.8  --   --   HGB 9.6* 9.6* 9.9*  HCT 31.4* 30.0* 30.4*  MCV 93.5 91.2 89.9  PLT 233 222 199   Cardiac Enzymes: No results for input(s): "CKTOTAL", "CKMB", "CKMBINDEX", "TROPONINIHS" in  the last 72 hours. BNP: No results for input(s): "BNP" in the last 72 hours. D-Dimer: No results for input(s): "DDIMER" in the last 72 hours. Hemoglobin A1C: No results for input(s): "HGBA1C" in the last 72 hours. Fasting Lipid Panel: Recent Labs    08/20/22 0403  CHOL 110  HDL 56  LDLCALC 39  TRIG 74  CHOLHDL 2.0   Thyroid Function Tests: No results for input(s): "TSH", "T4TOTAL", "T3FREE", "THYROIDAB" in the last 72 hours.  Invalid input(s): "FREET3" Anemia Panel: No results for input(s): "VITAMINB12", "FOLATE", "FERRITIN", "TIBC", "IRON", "RETICCTPCT" in the last 72 hours.   Radiology: CT HEAD  WO CONTRAST (5MM)  Result Date: 08/20/2022 CLINICAL DATA:  Neuro deficit, right-sided weakness. Follow-up head CT. EXAM: CT HEAD WITHOUT CONTRAST TECHNIQUE: Contiguous axial images were obtained from the base of the skull through the vertex without intravenous contrast. RADIATION DOSE REDUCTION: This exam was performed according to the departmental dose-optimization program which includes automated exposure control, adjustment of the mA and/or kV according to patient size and/or use of iterative reconstruction technique. COMPARISON:  CT head 1 day prior FINDINGS: Brain: There is no acute intracranial hemorrhage, extra-axial fluid collection, or acute infarct. Parenchymal volume is stable. The ventricles are stable in size. Gray-white differentiation is preserved. Background chronic small-vessel ischemic change is stable. 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 There is no mass lesion.  There is no mass effect or midline shift. Vascular: There is calcification of the bilateral carotid siphons. Skull: Normal. Negative for fracture or focal lesion. Sinuses/Orbits: The imaged paranasal sinuses are clear. Bilateral lens implants are in place. The globes and orbits are otherwise unremarkable. Other: None. IMPRESSION: Stable noncontrast head CT with no acute intracranial pathology. Electronically Signed   By: Valetta Mole M.D.   On: 08/20/2022 15:17   ECHOCARDIOGRAM COMPLETE  Result Date: 08/20/2022    ECHOCARDIOGRAM REPORT   Patient Name:   Cassandra [name] Date of Exam: 08/20/2022 Medical Rec #:  [id number]          Height:       59.0 in Accession #:    [id number]         Weight:       142.0 lb Date of Birth:  August 21, 1933          BSA:          1.595 m Patient Age:    56 years           BP:           184/112 mmHg Patient Gender: F                  HR:           79 bpm. Exam Location:  [facility] Procedure: 2D Echo, Color Doppler and Cardiac Doppler Indications:     Stroke-like symptom   History:         Patient has prior history of Echocardiogram examinations, most                  recent 06/03/2022. CAD, Pacemaker; Risk Factors:Diabetes and                  Sleep Apnea.  Sonographer:     Charmayne Sheer Referring Phys:  [id number] Charlesetta Ivory GONFA Diagnosing Phys: Neoma Laming  Sonographer Comments: Suboptimal apical window and suboptimal subcostal window. Image acquisition challenging due to breast implants. IMPRESSIONS  1. Left ventricular ejection fraction, by estimation, is  45 to 50%. The left ventricle has mildly decreased function. The left ventricle demonstrates global hypokinesis. The left ventricular internal cavity size was mildly dilated. There is moderate left ventricular hypertrophy. Left ventricular diastolic parameters are consistent with Grade II diastolic dysfunction (pseudonormalization).  2. Right ventricular systolic function is mildly reduced. The right ventricular size is moderately enlarged.  3. Left atrial size was moderately dilated.  4. Right atrial size was moderately dilated.  5. The mitral valve is normal in structure. Mild to moderate mitral valve regurgitation. No evidence of mitral stenosis.  6. Tricuspid valve regurgitation is moderate to severe.  7. The aortic valve is calcified. Aortic valve regurgitation is mild to moderate. Aortic valve sclerosis/calcification is present, without any evidence of aortic stenosis.  8. The inferior vena cava is normal in size with greater than 50% respiratory variability, suggesting right atrial pressure of 3 mmHg. Conclusion(s)/Recommendation(s): Severe pulmonary HTN. FINDINGS  Left Ventricle: Left ventricular ejection fraction, by estimation, is 45 to 50%. The left ventricle has mildly decreased function. The left ventricle demonstrates global hypokinesis. The left ventricular internal cavity size was mildly dilated. There is  moderate left ventricular hypertrophy. Left ventricular diastolic parameters are consistent with Grade II diastolic  dysfunction (pseudonormalization). Right Ventricle: The right ventricular size is moderately enlarged. No increase in right ventricular wall thickness. Right ventricular systolic function is mildly reduced. Left Atrium: Left atrial size was moderately dilated. Right Atrium: Right atrial size was moderately dilated. Pericardium: There is no evidence of pericardial effusion. Mitral Valve: The mitral valve is normal in structure. Mild to moderate mitral valve regurgitation. No evidence of mitral valve stenosis. Tricuspid Valve: The tricuspid valve is normal in structure. Tricuspid valve regurgitation is moderate to severe. No evidence of tricuspid stenosis. Aortic Valve: The aortic valve is calcified. Aortic valve regurgitation is mild to moderate. Aortic valve sclerosis/calcification is present, without any evidence of aortic stenosis. Aortic valve mean gradient measures 4.0 mmHg. Aortic valve peak gradient measures 7.2 mmHg. Aortic valve area, by VTI measures 1.41 cm. Pulmonic Valve: The pulmonic valve was normal in structure. Pulmonic valve regurgitation is mild to moderate. No evidence of pulmonic stenosis. Aorta: The aortic root is normal in size and structure. Venous: The inferior vena cava is normal in size with greater than 50% respiratory variability, suggesting right atrial pressure of 3 mmHg. IAS/Shunts: No atrial level shunt detected by color flow Doppler.  LEFT VENTRICLE PLAX 2D LVIDd:         4.50 cm   Diastology LVIDs:         3.40 cm   LV e' medial:    5.22 cm/s LV PW:         1.00 cm   LV E/e' medial:  21.8 LV IVS:        1.20 cm   LV e' lateral:   8.59 cm/s LVOT diam:     1.80 cm   LV E/e' lateral: 13.3 LV SV:         34 LV SV Index:   21 LVOT Area:     2.54 cm  RIGHT VENTRICLE RV Basal diam:  4.00 cm TAPSE (M-mode): 2.0 cm LEFT ATRIUM           Index        RIGHT ATRIUM           Index LA diam:      4.40 cm 2.76 cm/m   RA Area:     13.60 cm LA Vol (A4C): 47.1  ml 29.54 ml/m  RA Volume:   31.10 ml   19.50 ml/m  AORTIC VALVE                    PULMONIC VALVE AV Area (Vmax):    1.20 cm     PV Vmax:          0.90 m/s AV Area (Vmean):   1.34 cm     PV Vmean:         64.400 cm/s AV Area (VTI):     1.41 cm     PV VTI:           0.166 m AV Vmax:           134.00 cm/s  PV Peak grad:     3.3 mmHg AV Vmean:          90.500 cm/s  PV Mean grad:     2.0 mmHg AV VTI:            0.240 m      PR End Diast Vel: 8.76 msec AV Peak Grad:      7.2 mmHg AV Mean Grad:      4.0 mmHg LVOT Vmax:         63.10 cm/s LVOT Vmean:        47.500 cm/s LVOT VTI:          0.133 m LVOT/AV VTI ratio: 0.55  AORTA Ao Root diam: 3.00 cm MITRAL VALVE                TRICUSPID VALVE MV Area (PHT): 5.38 cm     TR Peak grad:   78.1 mmHg MV Decel Time: 141 msec     TR Vmax:        442.00 cm/s MV E velocity: 114.00 cm/s MV A velocity: 96.90 cm/s   SHUNTS MV E/A ratio:  1.18         Systemic VTI:  0.13 m                             Systemic Diam: 1.80 cm Neoma Laming Electronically signed by Neoma Laming Signature Date/Time: 08/20/2022/12:13:03 PM    Final    CT ANGIO HEAD NECK W WO CM W PERF (CODE STROKE)  Result Date: 08/19/2022 CLINICAL DATA:  Weakness and difficulty walking. EXAM: CT ANGIOGRAPHY HEAD AND NECK CT PERFUSION BRAIN TECHNIQUE: Multidetector CT imaging of the head and neck was performed using the standard protocol during bolus administration of intravenous contrast. Multiplanar CT image reconstructions and MIPs were obtained to evaluate the vascular anatomy. Carotid stenosis measurements (when applicable) are obtained utilizing NASCET criteria, using the distal internal carotid diameter as the denominator. Multiphase CT imaging of the brain was performed following IV bolus contrast injection. Subsequent parametric perfusion maps were calculated using RAPID software. RADIATION DOSE REDUCTION: This exam was performed according to the departmental dose-optimization program which includes automated exposure control, adjustment of the mA  and/or kV according to patient size and/or use of iterative reconstruction technique. CONTRAST:  35m OMNIPAQUE IOHEXOL 350 MG/ML SOLN COMPARISON:  Same-day noncontrast CT head, carotid Doppler 06/03/2022, CT neck with contrast 04/23/2012. FINDINGS: CTA NECK FINDINGS Aortic arch: There is calcified plaque in the aortic arch and at the origins of the major branch vessels. There is severe stenosis of the proximal left subclavian artery. Right carotid system: There is moderate stenosis of the origin of the right common carotid  artery. There is scattered calcified plaque throughout the remainder of the common carotid artery without hemodynamically significant stenosis or occlusion. There is bulky calcified plaque in the proximal internal carotid artery resulting in approximately 65-70% stenosis. The distal internal carotid artery is patent. The external carotid artery is patent. There is no dissection or aneurysm. Left carotid system: There is mixed plaque in the proximal left internal carotid artery resulting in less than 50% stenosis. The remainder of the common carotid artery is patent with scattered plaque but no hemodynamically significant stenosis. There is minimal calcified plaque in the proximal internal carotid artery and mild calcified plaque distally without hemodynamically significant stenosis. The external carotid artery is occluded. 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 There is no new dissection or aneurysm. Vertebral arteries: There is severe stenosis/occlusion of the proximal left internal carotid artery. The remainder of the left vertebral artery is diminutive in caliber but appears patent. The dominant right vertebral artery is patent without hemodynamically significant stenosis or occlusion. There is no aneurysm or dissection. Skeleton: There is no acute osseous abnormality or suspicious osseous lesion. There is no visible canal hematoma. Other neck: The soft  tissues of the neck are unremarkable. Upper chest: There is marked scarring and architectural distortion in the lung apices, similar to the prior CT chest. Review of the MIP images confirms the above findings CTA HEAD FINDINGS Anterior circulation: There is calcified plaque in the intracranial ICAs without greater than mild stenosis bilaterally. The bilateral MCAs are patent, without proximal stenosis or occlusion. The bilateral ACAs are patent, without proximal stenosis or occlusion. The right A1 segment is diminutive, likely a developmental variant. There is no aneurysm or AVM. Posterior circulation: The bilateral V4 segments are patent. The basilar artery is patent. The major cerebellar arteries are patent. There is a fetal origin of the right PCA. The right PCA is patent, without proximal stenosis or occlusion. There is also a predominantly fetal origin of the left PCA with moderate to severe stenosis at the PCOM/P2 junction (10-101). The remainder of the PCAs patent, without other proximal high-grade stenosis or occlusion There is no aneurysm or AVM. Venous sinuses: Suboptimally evaluated due to bolus timing but grossly patent. Anatomic variants: As above. Review of the MIP images confirms the above findings CT Brain Perfusion Findings: ASPECTS: 10 CBF (<30%) Volume: 69m Perfusion (Tmax>6.0s) volume: 44mMismatch Volume: 4026mnfarction Location:No infarct core is identified. The penumbra described above is scattered in the bilateral cerebral and cerebellar hemispheres, favored artifactual. IMPRESSION: 1. No emergent large vessel occlusion. 2. No infarct core identified. Scattered penumbra is identified in both cerebral and cerebellar hemispheres, favored artifactual. MRI may be considered for better evaluation. 3. Bulky plaque in the proximal right internal carotid artery resulting in approximately 65-70% stenosis. 4. Mild plaque in the left common and internal carotid arteries without hemodynamically  significant stenosis or occlusion. 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 5. Occluded left external carotid artery. 6. Severe stenosis of the origin of the non dominant left vertebral artery which is diminutive in caliber throughout the remainder of the neck. Patent dominant right vertebral artery. 7. Fetal left PCA with severe stenosis of the PCOM/P2 junction. 8. Mild plaque in the intracranial ICAs without hemodynamically significant stenosis or occlusion. 9. Severe stenosis of the left subclavian artery. These results were called by telephone at the time of interpretation on 08/19/2022 at 3:19  pm to provider Su Monks , who verbally acknowledged these results. Electronically Signed   By: Valetta Mole M.D.   On: 08/19/2022 15:36   CT HEAD CODE STROKE WO CONTRAST  Result Date: 08/19/2022 CLINICAL DATA:  Code stroke.  Stroke.  Last known normal 0930 hours EXAM: CT HEAD WITHOUT CONTRAST TECHNIQUE: Contiguous axial images were obtained from the base of the skull through the vertex without intravenous contrast. RADIATION DOSE REDUCTION: This exam was performed according to the departmental dose-optimization program which includes automated exposure control, adjustment of the mA and/or kV according to patient size and/or use of iterative reconstruction technique. COMPARISON:  09/02/2021 FINDINGS: Brain: Age related volume loss without subjective lobar predominance. Old small vessel infarctions affect the cerebellum. Probable chronic small-vessel change of the pons. Cerebral hemispheres show chronic small-vessel ischemic change of the white matter. No cortical or large vessel territory infarction. No mass lesion, hemorrhage, hydrocephalus or extra-axial collection. Vascular: There is atherosclerotic calcification of the major vessels at the base of the brain. Skull: Negative Sinuses/Orbits: Clear/normal Other: None ASPECTS (Johnson  Stroke Program Early CT Score) - Ganglionic level infarction (caudate, lentiform nuclei, internal capsule, insula, M1-M3 cortex): 7 - Supraganglionic infarction (M4-M6 cortex): 3 Total score (0-10 with 10 being normal): 10 IMPRESSION: 1. No acute CT finding. Chronic small-vessel ischemic changes of the cerebral hemispheric white matter and cerebellum. 2. Aspects is 10. These results were communicated to Dr. Quinn Axe at 2:38 pm on 08/19/2022 by text page via the [facility] messaging system. Electronically Signed   By: Nelson Chimes M.D.   On: 08/19/2022 14:39   DG SWALLOW FUNC OP MEDICARE SPEECH PATH  Result Date: 08/16/2022 CLINICAL DATA:  Dysphagia. EXAM: MODIFIED BARIUM SWALLOW TECHNIQUE: Different consistencies of barium were administered orally to the patient by the Speech Pathologist. Imaging of the pharynx was performed in the lateral projection. Reatha Armour was present in the fluoroscopy room during this study, which was supervised and interpreted by Valetta Mole, MD. FLUOROSCOPY: Radiation Exposure Index (as provided by the fluoroscopic device): 5.6 mGy Kerma COMPARISON:  None Available. FINDINGS: Vestibular  Penetration:  Positive for laryngeal penetration. Aspiration:  None seen. Other:  None. IMPRESSION: Laryngeal penetration without frank aspiration. Please refer to the Speech Pathologists report for complete details and recommendations. Electronically Signed   By: Valetta Mole M.D.   On: 08/16/2022 15:03   DG Chest 2 View  Result Date: 08/09/2022 CLINICAL DATA:  Short of breath, hypoxia EXAM: CHEST - 2 VIEW COMPARISON:  06/02/2022 FINDINGS: Frontal and lateral views of the chest demonstrates stable dual lead pacemaker. Right internal jugular catheter tip overlies superior vena cava. The cardiac silhouette is enlarged. There is increased interstitial prominence since prior study, with trace bilateral pleural effusions. No focal consolidation or pneumothorax. No acute bony abnormalities. IMPRESSION: 1.  Findings consistent with mild congestive heart failure and interstitial edema. Electronically Signed   By: Randa Ngo M.D.   On: 08/09/2022 15:22   CT TIBIA FIBULA LEFT W CONTRAST  Result Date: 08/04/2022 CLINICAL DATA:  Follow-up osteomyelitis EXAM: CT OF THE LOWER LEFT EXTREMITY WITH CONTRAST TECHNIQUE: Multidetector CT imaging of the lower left extremity was performed according to the standard protocol following intravenous contrast administration. RADIATION DOSE REDUCTION: This exam was performed according to the departmental dose-optimization program which includes automated exposure control, adjustment of the mA and/or kV according to patient size and/or use of iterative reconstruction technique. CONTRAST:  83m OMNIPAQUE IOHEXOL 300 MG/ML  SOLN COMPARISON:  X-ray 05/03/2022, CT 04/12/2022 FINDINGS:  Bones/Joint/Cartilage Diffuse osseous demineralization. Since the previous CT, there has been interval placement of antibiotic cement spacer at the distal tibial metaphysis and medial malleolus. Increasing sclerosis within the distal tibia adjacent to the antibiotic spacer. The degree of bone loss of the distal tibia and talar dome and talar neck has not appreciably progressed from the previous CT. No new sites of bone erosion. Posttraumatic and postsurgical changes of the distal tibia and fibula. No acute fracture or dislocation. Alignment of the hindfoot and midfoot are maintained. Similar degenerative changes of the knee and foot. Several loose bodies within a Baker's cyst. Ligaments Suboptimally assessed by CT. Muscles and Tendons Generalized muscle atrophy. No intramuscular fluid collection. Tendinous structures remain grossly intact. Soft tissues There appears to be developing heterotopic calcification along the anterolateral margin of the distal tibia. Circumferential subcutaneous edema and fluid. No organized or rim enhancing fluid collections. No soft tissue gas. IMPRESSION: 1. Since the previous  CT, there has been interval placement of antibiotic cement spacer at the distal tibial metaphysis and medial malleolus. Increasing sclerosis within the distal tibia adjacent to the antibiotic spacer. The degree of bone loss of the distal tibia and talar dome and talar neck has not appreciably progressed from the previous CT. No new sites of bone erosion. 2. Circumferential subcutaneous edema and fluid, which may represent cellulitis. No organized or rim enhancing fluid collections. Electronically Signed   By: Davina Poke D.O.   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  Cardiology was consulted to interrogate her pacemaker for evidence of subclinical atrial fibrillation as a cause of her ischemic stroke.  # Acute ischemic stroke # paroxysmal AF detected on PPM interrogation # sick sinus syndrome s/p [employer] DC PPM  Per neurology, they would like to repeat her head CT on Friday 08/2021 to confirm stability of the infarct and await to start  Eliquis if this head CT is okay to reduce the risk of hemorrhagic conversion. -meets criteria for dose reduced eliquis 2.'5mg'$  BID (Age >80 and Scr >1.5) to start if repeat head CT is stable on Friday 12/22 to take alongside plavix  '75mg'$  daily. Stop aspirin once eliquis is started. CHA2DS2-VASc of 8 (age, sex, CHF, hx CVA, CAD, DM2)   This patient's plan of care was discussed and created with Dr. Saralyn Pilar and he is in agreement.  Signed: Tristan Schroeder , PA-C 08/21/2022, 8:17 AM [facility]

## 2022-08-21 NOTE — Discharge Summary (Signed)
Physician Discharge Summary   Cassandra [name]  female DOB: 06/22/1933  YDX:[id number]  PCP: Idelle Crouch, MD  Admit date: 08/19/2022 Discharge date: 08/21/2022  Admitted From: home Disposition:  home Husband updated on the phone prior to discharge. Home Health: Yes CODE STATUS: Full code  Discharge Instructions     Ambulatory referral to Neurology   Complete by: As directed    Discharge instructions   Complete by: As directed    Clinically, you presented with a stroke.  You have been started on Plavix per recommendation of neurology and cardiology for prevention of stroke and heart attack.    Please come back to hospital for repeat CT head scan.  If it looks stable, then you will be started on Eliquis (blood thinner) due to increased stroke risk from your atrial fibrillation.    I have held all your insulin, since you haven't needed it in the hospital, and your blood sugar decreases overnight.  Please follow up with outpatient provider to further determine the need for insulin.   [facility] Course:  For full details, please see H&P, progress notes, consult notes and ancillary notes.  Briefly,  Cassandra [name] is a 88 y.o. female with PMH of COPD/chronic hypoxic RF on 2 L, left ankle osteomyelitis on IV antibiotics, IDDM-2, CAD, diastolic CHF, breast cancer s/p right mastectomy, neuromuscular disorder, hypothyroidism, symptomatic bradycardia s/p PPM and hypertension presenting with acute onset right-sided weakness.    Acute stroke --dx clinically by neuro.  Unable to perform MRI due to pacemaker. --TTE showed no intracardiac clot but did show LA dilation.  --Head CT at 24 hrs stable with no acute intracranial process.  Pacemaker was interrogated and showed she had multiple episodes (up to multiple hours) of AF in 2022, but none recently.  --per Neuro, "While given no recent episodes which means this was not the etiology of the present  stroke she will require anticoagulation for stroke prevention going forward. CHADS2VASc is 8 giving her a very high risk of stroke if not anticoagulated." --repeat CT head as outpatient this Friday AM.  Will call pt to start Eliquis if no hemorrhagic conversion  - D/c aspirin - start plavix per cardiology given history of cardiac stent placement.  She should be continued on plavix when she is started on eliquis - No indication for statin, LDL is at goal without --outpatient neuro f/u   Hypertensive urgency:  --BP elevated to 190/114. --allowed permissive HTN.  Discharged back on home BP meds (see below)   Chronic diastolic CHF:  Appears euvolemic. Prior TTE with grade 2 DD.  On p.o. Lasix 20 mg at home.   History of SSS/symptomatic bradycardia s/p PPM  Paroxysmal Afib --see above for plan to start Eliquis.   AKI on CKD-4:  Baseline Cr seems to range from 1.6-2.0. --Cr 2.39 on presentation.  Improved to 2.01 prior to discharge.   Chronic COPD:  Appears stable.   IDDM-2:  --BG mostly within goal without insulin. --Hold home insulin until outpatient f/u. --resume home metformin after discharge.   Chronic osteomyelitis of left ankle/left foot:  --cont home abx (see below)   Hypothyroidism --cont Syndhroid   History of breast cancer status post right mastectomy -Outpatient follow-up.    Discharge Diagnoses:  Principal Problem:   Stroke-like symptom Active Problems:   Chronic diastolic CHF (congestive heart failure) (HCC)   Hypertensive urgency   Chronic osteomyelitis involving ankle and foot,  left [facility])   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   COPD (chronic obstructive pulmonary disease) (HCC)   CAD (coronary artery disease)   Insulin-requiring or dependent type II diabetes mellitus (HCC)   Sick sinus syndrome ([place])   Hardware complicating wound infection ([place])   CKD (chronic kidney disease) stage 4, GFR 15-29 ml/min (HCC)   AKI (acute kidney injury)  ([place])   30 Day Unplanned Readmission Risk Score    Flowsheet Row ED from 08/09/2022 in [place]  30 Day Unplanned Readmission Risk Score (%) 23.08 Filed at 08/10/2022 1600       This score is the patient's risk of an unplanned readmission within 30 days of being discharged (0 -100%). The score is based on dignosis, age, lab data, medications, orders, and past utilization.   Low:  0-14.9   Medium: 15-21.9   High: 22-29.9   Extreme: 30 and above         Discharge Instructions:  Allergies as of 08/21/2022       Reactions   Baclofen Other (See Comments)   Hallucinations   Levofloxacin Other (See Comments)   Hallucinations         Medication List     TAKE these medications    acetaminophen 500 MG tablet Commonly known as: TYLENOL Take 500-1,000 mg by mouth every 6 (six) hours as needed for moderate pain.   albuterol 108 (90 Base) MCG/ACT inhaler Commonly known as: VENTOLIN HFA Inhale 2 puffs into the lungs every 6 (six) hours as needed for wheezing or shortness of breath.   amikacin 1 GM/4ML Soln injection Commonly known as: AMIKIN Inject 800 mg into the vein every Tuesday, Thursday, and Saturday at 6 PM.   amitriptyline 10 MG tablet Commonly known as: ELAVIL Take 10 mg by mouth at bedtime.   cefOXitin 2 g injection Commonly known as: MEFOXIN Inject 2 g into the vein every 12 (twelve) hours.   cholecalciferol 25 MCG (1000 UNIT) tablet Commonly known as: VITAMIN D3 Take 1,000 Units by mouth daily.   CLOFAZIMINE PO Take 100 mg by mouth every morning.   clopidogrel 75 MG tablet Commonly known as: PLAVIX Take 1 tablet (75 mg total) by mouth daily.   desvenlafaxine 50 MG 24 hr tablet Commonly known as: PRISTIQ Take 50 mg by mouth every evening.   furosemide 20 MG tablet Commonly known as: LASIX Take 20 mg by mouth daily.   Lantus SoloStar 100 UNIT/ML Solostar Pen Generic drug: insulin glargine Hold until  followup with outpatient provider. What changed:  how much to take how to take this when to take this additional instructions   levothyroxine 100 MCG tablet Commonly known as: SYNTHROID Take 100 mcg by mouth every morning.   linezolid 600 MG tablet Commonly known as: ZYVOX Take 1 tablet (600 mg total) by mouth daily.   metFORMIN 500 MG tablet Commonly known as: GLUCOPHAGE Take 500 mg by mouth 2 (two) times daily with a meal.   metoCLOPramide 5 MG tablet Commonly known as: REGLAN Take 5 mg by mouth 2 (two) times daily before a meal.   metoprolol tartrate 25 MG tablet Commonly known as: LOPRESSOR Take 1 tablet (25 mg total) by mouth 2 (two) times daily.   multivitamin with minerals Tabs tablet Take 1 tablet by mouth daily.   nitroGLYCERIN 0.4 MG SL tablet Commonly known as: NITROSTAT Place 1 tablet (0.4 mg total) under the tongue every 5 (five) minutes as needed for chest pain.  NovoLOG FlexPen 100 UNIT/ML FlexPen Generic drug: insulin aspart Hold until followup with outpatient provider. What changed:  how much to take how to take this when to take this additional instructions   telmisartan 40 MG tablet Commonly known as: MICARDIS Take 40 mg by mouth daily.   traZODone 50 MG tablet Commonly known as: DESYREL Take 50 mg by mouth at bedtime.         Follow-up Information     Paraschos, Alexander, MD. Go in 1 week(s).   Specialty: Cardiology Contact information: [facility] [place] [address] [phone]         Idelle Crouch, MD Follow up in 1 week(s).   Specialty: Internal Medicine Contact information: [place] [address] [phone]                 Allergies  Allergen Reactions   Baclofen Other (See Comments)    Hallucinations   Levofloxacin Other (See Comments)    Hallucinations      The results of significant diagnostics from this  hospitalization (including imaging, microbiology, ancillary and laboratory) are listed below for reference.   Consultations:   Procedures/Studies: CT HEAD WO CONTRAST (5MM)  Result Date: 08/20/2022 CLINICAL DATA:  Neuro deficit, right-sided weakness. Follow-up head CT. EXAM: CT HEAD WITHOUT CONTRAST TECHNIQUE: Contiguous axial images were obtained from the base of the skull through the vertex without intravenous contrast. RADIATION DOSE REDUCTION: This exam was performed according to the departmental dose-optimization program which includes automated exposure control, adjustment of the mA and/or kV according to patient size and/or use of iterative reconstruction technique. COMPARISON:  CT head 1 day prior FINDINGS: Brain: There is no acute intracranial hemorrhage, extra-axial fluid collection, or acute infarct. Parenchymal volume is stable. The ventricles are stable in size. Gray-white differentiation is preserved. Background chronic small-vessel ischemic change is stable. 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 There is no mass lesion.  There is no mass effect or midline shift. Vascular: There is calcification of the bilateral carotid siphons. Skull: Normal. Negative for fracture or focal lesion. Sinuses/Orbits: The imaged paranasal sinuses are clear. Bilateral lens implants are in place. The globes and orbits are otherwise unremarkable. Other: None. IMPRESSION: Stable noncontrast head CT with no acute intracranial pathology. Electronically Signed   By: Valetta Mole M.D.   On: 08/20/2022 15:17   ECHOCARDIOGRAM COMPLETE  Result Date: 08/20/2022    ECHOCARDIOGRAM REPORT   Patient Name:   TAKELIA URIETA Date of Exam: 08/20/2022 Medical Rec #:  [id number]          Height:       59.0 in Accession #:    [id number]         Weight:       142.0 lb Date of Birth:  1933-02-05          BSA:          1.595 m Patient Age:    50 years           BP:           184/112 mmHg Patient  Gender: F                  HR:           79 bpm. Exam Location:  [facility] Procedure: 2D Echo, Color Doppler and Cardiac Doppler Indications:     Stroke-like symptom  History:  Patient has prior history of Echocardiogram examinations, most                  recent 06/03/2022. CAD, Pacemaker; Risk Factors:Diabetes and                  Sleep Apnea.  Sonographer:     Charmayne Sheer Referring Phys:  [id number] Charlesetta Ivory GONFA Diagnosing Phys: Neoma Laming  Sonographer Comments: Suboptimal apical window and suboptimal subcostal window. Image acquisition challenging due to breast implants. IMPRESSIONS  1. Left ventricular ejection fraction, by estimation, is 45 to 50%. The left ventricle has mildly decreased function. The left ventricle demonstrates global hypokinesis. The left ventricular internal cavity size was mildly dilated. There is moderate left ventricular hypertrophy. Left ventricular diastolic parameters are consistent with Grade II diastolic dysfunction (pseudonormalization).  2. Right ventricular systolic function is mildly reduced. The right ventricular size is moderately enlarged.  3. Left atrial size was moderately dilated.  4. Right atrial size was moderately dilated.  5. The mitral valve is normal in structure. Mild to moderate mitral valve regurgitation. No evidence of mitral stenosis.  6. Tricuspid valve regurgitation is moderate to severe.  7. The aortic valve is calcified. Aortic valve regurgitation is mild to moderate. Aortic valve sclerosis/calcification is present, without any evidence of aortic stenosis.  8. The inferior vena cava is normal in size with greater than 50% respiratory variability, suggesting right atrial pressure of 3 mmHg. Conclusion(s)/Recommendation(s): Severe pulmonary HTN. FINDINGS  Left Ventricle: Left ventricular ejection fraction, by estimation, is 45 to 50%. The left ventricle has mildly decreased function. The left ventricle demonstrates global hypokinesis. The left ventricular  internal cavity size was mildly dilated. There is  moderate left ventricular hypertrophy. Left ventricular diastolic parameters are consistent with Grade II diastolic dysfunction (pseudonormalization). Right Ventricle: The right ventricular size is moderately enlarged. No increase in right ventricular wall thickness. Right ventricular systolic function is mildly reduced. Left Atrium: Left atrial size was moderately dilated. Right Atrium: Right atrial size was moderately dilated. Pericardium: There is no evidence of pericardial effusion. Mitral Valve: The mitral valve is normal in structure. Mild to moderate mitral valve regurgitation. No evidence of mitral valve stenosis. Tricuspid Valve: The tricuspid valve is normal in structure. Tricuspid valve regurgitation is moderate to severe. No evidence of tricuspid stenosis. Aortic Valve: The aortic valve is calcified. Aortic valve regurgitation is mild to moderate. Aortic valve sclerosis/calcification is present, without any evidence of aortic stenosis. Aortic valve mean gradient measures 4.0 mmHg. Aortic valve peak gradient measures 7.2 mmHg. Aortic valve area, by VTI measures 1.41 cm. Pulmonic Valve: The pulmonic valve was normal in structure. Pulmonic valve regurgitation is mild to moderate. No evidence of pulmonic stenosis. Aorta: The aortic root is normal in size and structure. Venous: The inferior vena cava is normal in size with greater than 50% respiratory variability, suggesting right atrial pressure of 3 mmHg. IAS/Shunts: No atrial level shunt detected by color flow Doppler.  LEFT VENTRICLE PLAX 2D LVIDd:         4.50 cm   Diastology LVIDs:         3.40 cm   LV e' medial:    5.22 cm/s LV PW:         1.00 cm   LV E/e' medial:  21.8 LV IVS:        1.20 cm   LV e' lateral:   8.59 cm/s LVOT diam:     1.80 cm   LV E/e' lateral:  13.3 LV SV:         34 LV SV Index:   21 LVOT Area:     2.54 cm  RIGHT VENTRICLE RV Basal diam:  4.00 cm TAPSE (M-mode): 2.0 cm LEFT ATRIUM            Index        RIGHT ATRIUM           Index LA diam:      4.40 cm 2.76 cm/m   RA Area:     13.60 cm LA Vol (A4C): 47.1 ml 29.54 ml/m  RA Volume:   31.10 ml  19.50 ml/m  AORTIC VALVE                    PULMONIC VALVE AV Area (Vmax):    1.20 cm     PV Vmax:          0.90 m/s AV Area (Vmean):   1.34 cm     PV Vmean:         64.400 cm/s AV Area (VTI):     1.41 cm     PV VTI:           0.166 m AV Vmax:           134.00 cm/s  PV Peak grad:     3.3 mmHg AV Vmean:          90.500 cm/s  PV Mean grad:     2.0 mmHg AV VTI:            0.240 m      PR End Diast Vel: 8.76 msec AV Peak Grad:      7.2 mmHg AV Mean Grad:      4.0 mmHg LVOT Vmax:         63.10 cm/s LVOT Vmean:        47.500 cm/s LVOT VTI:          0.133 m LVOT/AV VTI ratio: 0.55  AORTA Ao Root diam: 3.00 cm MITRAL VALVE                TRICUSPID VALVE MV Area (PHT): 5.38 cm     TR Peak grad:   78.1 mmHg MV Decel Time: 141 msec     TR Vmax:        442.00 cm/s MV E velocity: 114.00 cm/s MV A velocity: 96.90 cm/s   SHUNTS MV E/A ratio:  1.18         Systemic VTI:  0.13 m                             Systemic Diam: 1.80 cm Neoma Laming Electronically signed by Neoma Laming Signature Date/Time: 08/20/2022/12:13:03 PM    Final    CT ANGIO HEAD NECK W WO CM W PERF (CODE STROKE)  Result Date: 08/19/2022 CLINICAL DATA:  Weakness and difficulty walking. EXAM: CT ANGIOGRAPHY HEAD AND NECK CT PERFUSION BRAIN TECHNIQUE: Multidetector CT imaging of the head and neck was performed using the standard protocol during bolus administration of intravenous contrast. Multiplanar CT image reconstructions and MIPs were obtained to evaluate the vascular anatomy. Carotid stenosis measurements (when applicable) are obtained utilizing NASCET criteria, using the distal internal carotid diameter as the denominator. Multiphase CT imaging of the brain was performed following IV bolus contrast injection. Subsequent parametric perfusion maps were calculated using RAPID software.  RADIATION DOSE REDUCTION: This exam was performed  according to the departmental dose-optimization program which includes automated exposure control, adjustment of the mA and/or kV according to patient size and/or use of iterative reconstruction technique. CONTRAST:  61m OMNIPAQUE IOHEXOL 350 MG/ML SOLN COMPARISON:  Same-day noncontrast CT head, carotid Doppler 06/03/2022, CT neck with contrast 04/23/2012. FINDINGS: CTA NECK FINDINGS Aortic arch: There is calcified plaque in the aortic arch and at the origins of the major branch vessels. There is severe stenosis of the proximal left subclavian artery. Right carotid system: There is moderate stenosis of the origin of the right common carotid artery. There is scattered calcified plaque throughout the remainder of the common carotid artery without hemodynamically significant stenosis or occlusion. There is bulky calcified plaque in the proximal internal carotid artery resulting in approximately 65-70% stenosis. The distal internal carotid artery is patent. The external carotid artery is patent. There is no dissection or aneurysm. Left carotid system: There is mixed plaque in the proximal left internal carotid artery resulting in less than 50% stenosis. The remainder of the common carotid artery is patent with scattered plaque but no hemodynamically significant stenosis. There is minimal calcified plaque in the proximal internal carotid artery and mild calcified plaque distally without hemodynamically significant stenosis. The external carotid artery is occluded. 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 There is no new dissection or aneurysm. Vertebral arteries: There is severe stenosis/occlusion of the proximal left internal carotid artery. The remainder of the left vertebral artery is diminutive in caliber but appears patent. The dominant right vertebral artery is patent without hemodynamically significant stenosis or occlusion.  There is no aneurysm or dissection. Skeleton: There is no acute osseous abnormality or suspicious osseous lesion. There is no visible canal hematoma. Other neck: The soft tissues of the neck are unremarkable. Upper chest: There is marked scarring and architectural distortion in the lung apices, similar to the prior CT chest. Review of the MIP images confirms the above findings CTA HEAD FINDINGS Anterior circulation: There is calcified plaque in the intracranial ICAs without greater than mild stenosis bilaterally. The bilateral MCAs are patent, without proximal stenosis or occlusion. The bilateral ACAs are patent, without proximal stenosis or occlusion. The right A1 segment is diminutive, likely a developmental variant. There is no aneurysm or AVM. Posterior circulation: The bilateral V4 segments are patent. The basilar artery is patent. The major cerebellar arteries are patent. There is a fetal origin of the right PCA. The right PCA is patent, without proximal stenosis or occlusion. There is also a predominantly fetal origin of the left PCA with moderate to severe stenosis at the PCOM/P2 junction (10-101). The remainder of the PCAs patent, without other proximal high-grade stenosis or occlusion There is no aneurysm or AVM. Venous sinuses: Suboptimally evaluated due to bolus timing but grossly patent. Anatomic variants: As above. Review of the MIP images confirms the above findings CT Brain Perfusion Findings: ASPECTS: 10 CBF (<30%) Volume: 079mPerfusion (Tmax>6.0s) volume: 4021mismatch Volume: 20m31mfarction Location:No infarct core is identified. The penumbra described above is scattered in the bilateral cerebral and cerebellar hemispheres, favored artifactual. IMPRESSION: 1. No emergent large vessel occlusion. 2. No infarct core identified. Scattered penumbra is identified in both cerebral and cerebellar hemispheres, favored artifactual. MRI may be considered for better evaluation. 3. Bulky plaque in the  proximal right internal carotid artery resulting in approximately 65-70% stenosis. 4. Mild plaque in the left common and internal carotid arteries without hemodynamically significant stenosis or occlusion. 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 5. Occluded left external carotid artery. 6. Severe stenosis of the origin of the non dominant left vertebral artery which is diminutive in caliber throughout the remainder of the neck. Patent dominant right vertebral artery. 7. Fetal left PCA with severe stenosis of the PCOM/P2 junction. 8. Mild plaque in the intracranial ICAs without hemodynamically significant stenosis or occlusion. 9. Severe stenosis of the left subclavian artery. These results were called by telephone at the time of interpretation on 08/19/2022 at 3:19 pm to provider [facility] , who verbally acknowledged these results. Electronically Signed   By: Valetta Mole M.D.   On: 08/19/2022 15:36   CT HEAD CODE STROKE WO CONTRAST  Result Date: 08/19/2022 CLINICAL DATA:  Code stroke.  Stroke.  Last known normal 0930 hours EXAM: CT HEAD WITHOUT CONTRAST TECHNIQUE: Contiguous axial images were obtained from the base of the skull through the vertex without intravenous contrast. RADIATION DOSE REDUCTION: This exam was performed according to the departmental dose-optimization program which includes automated exposure control, adjustment of the mA and/or kV according to patient size and/or use of iterative reconstruction technique. COMPARISON:  09/02/2021 FINDINGS: Brain: Age related volume loss without subjective lobar predominance. Old small vessel infarctions affect the cerebellum. Probable chronic small-vessel change of the pons. Cerebral hemispheres show chronic small-vessel ischemic change of the white matter. No cortical or large vessel territory infarction. No mass lesion, hemorrhage, hydrocephalus or extra-axial collection.  Vascular: There is atherosclerotic calcification of the major vessels at the base of the brain. Skull: Negative Sinuses/Orbits: Clear/normal Other: None ASPECTS ([place] Stroke Program Early CT Score) - Ganglionic level infarction (caudate, lentiform nuclei, internal capsule, insula, M1-M3 cortex): 7 - Supraganglionic infarction (M4-M6 cortex): 3 Total score (0-10 with 10 being normal): 10 IMPRESSION: 1. No acute CT finding. Chronic small-vessel ischemic changes of the cerebral hemispheric white matter and cerebellum. 2. Aspects is 10. These results were communicated to Dr. Quinn Axe at 2:38 pm on 08/19/2022 by text page via the [facility] messaging system. Electronically Signed   By: Nelson Chimes M.D.   On: 08/19/2022 14:39   DG SWALLOW FUNC OP MEDICARE SPEECH PATH  Result Date: 08/16/2022 CLINICAL DATA:  Dysphagia. EXAM: MODIFIED BARIUM SWALLOW TECHNIQUE: Different consistencies of barium were administered orally to the patient by the Speech Pathologist. Imaging of the pharynx was performed in the lateral projection. Reatha Armour was present in the fluoroscopy room during this study, which was supervised and interpreted by Valetta Mole, MD. FLUOROSCOPY: Radiation Exposure Index (as provided by the fluoroscopic device): 5.6 mGy Kerma COMPARISON:  None Available. FINDINGS: Vestibular  Penetration:  Positive for laryngeal penetration. Aspiration:  None seen. Other:  None. IMPRESSION: Laryngeal penetration without frank aspiration. Please refer to the Speech Pathologists report for complete details and recommendations. Electronically Signed   By: Valetta Mole M.D.   On: 08/16/2022 15:03   DG Chest 2 View  Result Date: 08/09/2022 CLINICAL DATA:  Short of breath, hypoxia EXAM: CHEST - 2 VIEW COMPARISON:  06/02/2022 FINDINGS: Frontal and lateral views of the chest demonstrates stable dual lead pacemaker. Right internal jugular catheter tip overlies superior vena cava. The cardiac silhouette is enlarged. There is  increased interstitial prominence since prior study, with trace bilateral pleural effusions. No focal consolidation or pneumothorax. No acute bony abnormalities. IMPRESSION: 1. Findings consistent with mild congestive heart failure and interstitial edema. Electronically Signed   By: Randa Ngo M.D.   On: 08/09/2022  15:22   CT TIBIA FIBULA LEFT W CONTRAST  Result Date: 08/04/2022 CLINICAL DATA:  Follow-up osteomyelitis EXAM: CT OF THE LOWER LEFT EXTREMITY WITH CONTRAST TECHNIQUE: Multidetector CT imaging of the lower left extremity was performed according to the standard protocol following intravenous contrast administration. RADIATION DOSE REDUCTION: This exam was performed according to the departmental dose-optimization program which includes automated exposure control, adjustment of the mA and/or kV according to patient size and/or use of iterative reconstruction technique. CONTRAST:  20m OMNIPAQUE IOHEXOL 300 MG/ML  SOLN COMPARISON:  X-ray 05/03/2022, CT 04/12/2022 FINDINGS: Bones/Joint/Cartilage Diffuse osseous demineralization. Since the previous CT, there has been interval placement of antibiotic cement spacer at the distal tibial metaphysis and medial malleolus. Increasing sclerosis within the distal tibia adjacent to the antibiotic spacer. The degree of bone loss of the distal tibia and talar dome and talar neck has not appreciably progressed from the previous CT. No new sites of bone erosion. Posttraumatic and postsurgical changes of the distal tibia and fibula. No acute fracture or dislocation. Alignment of the hindfoot and midfoot are maintained. Similar degenerative changes of the knee and foot. Several loose bodies within a Baker's cyst. Ligaments Suboptimally assessed by CT. Muscles and Tendons Generalized muscle atrophy. No intramuscular fluid collection. Tendinous structures remain grossly intact. Soft tissues There appears to be developing heterotopic calcification along the anterolateral  margin of the distal tibia. Circumferential subcutaneous edema and fluid. No organized or rim enhancing fluid collections. No soft tissue gas. IMPRESSION: 1. Since the previous CT, there has been interval placement of antibiotic cement spacer at the distal tibial metaphysis and medial malleolus. Increasing sclerosis within the distal tibia adjacent to the antibiotic spacer. The degree of bone loss of the distal tibia and talar dome and talar neck has not appreciably progressed from the previous CT. No new sites of bone erosion. 2. Circumferential subcutaneous edema and fluid, which may represent cellulitis. No organized or rim enhancing fluid collections. Electronically Signed   By: NDavina PokeD.O.   On: 08/04/2022 12:32      Labs: BNP (last 3 results) Recent Labs    08/09/22 1436  BNP 17,371.0   Basic Metabolic Panel: Recent Labs  Lab 08/19/22 1501 08/20/22 0403 08/21/22 0523  NA 139 140 140  K 4.2 3.9 3.6  CL 106 108 108  CO2 '22 24 24  '$ GLUCOSE 113* 116* 125*  BUN 25* 27* 26*  CREATININE 2.39* 2.27* 2.01*  CALCIUM 8.8* 8.7* 8.3*  MG  --   --  1.9   Liver Function Tests: Recent Labs  Lab 08/19/22 1501  AST 41  ALT 29  ALKPHOS 129*  BILITOT 0.6  PROT 7.0  ALBUMIN 2.6*   No results for input(s): "LIPASE", "AMYLASE" in the last 168 hours. No results for input(s): "AMMONIA" in the last 168 hours. CBC: Recent Labs  Lab 08/19/22 1426 08/20/22 0403 08/21/22 0523  WBC 8.4 10.0 8.4  NEUTROABS 5.8  --   --   HGB 9.6* 9.6* 9.9*  HCT 31.4* 30.0* 30.4*  MCV 93.5 91.2 89.9  PLT 233 222 199   Cardiac Enzymes: No results for input(s): "CKTOTAL", "CKMB", "CKMBINDEX", "TROPONINI" in the last 168 hours. BNP: Invalid input(s): "POCBNP" CBG: Recent Labs  Lab 08/20/22 0751 08/20/22 1112 08/20/22 1602 08/20/22 2000 08/21/22 0803  GLUCAP 118* 137* 152* 205* 119*   D-Dimer No results for input(s): "DDIMER" in the last 72 hours. Hgb A1c No results for input(s):  "HGBA1C" in the last 72 hours. Lipid Profile  Recent Labs    08/20/22 0403  CHOL 110  HDL 56  LDLCALC 39  TRIG 74  CHOLHDL 2.0   Thyroid function studies No results for input(s): "TSH", "T4TOTAL", "T3FREE", "THYROIDAB" in the last 72 hours.  Invalid input(s): "FREET3" Anemia work up No results for input(s): "VITAMINB12", "FOLATE", "FERRITIN", "TIBC", "IRON", "RETICCTPCT" in the last 72 hours. Urinalysis    Component Value Date/Time   COLORURINE YELLOW (A) 06/02/2022 1508   APPEARANCEUR CLEAR (A) 06/02/2022 1508   APPEARANCEUR Clear 10/27/2017 1508   LABSPEC 1.011 06/02/2022 1508   LABSPEC 1.008 05/11/2012 1400   PHURINE 6.0 06/02/2022 1508   GLUCOSEU NEGATIVE 06/02/2022 1508   GLUCOSEU Negative 05/11/2012 1400   HGBUR NEGATIVE 06/02/2022 1508   BILIRUBINUR NEGATIVE 06/02/2022 1508   BILIRUBINUR Negative 10/27/2017 1508   BILIRUBINUR Negative 05/11/2012 1400   KETONESUR NEGATIVE 06/02/2022 1508   PROTEINUR 30 (A) 06/02/2022 1508   NITRITE NEGATIVE 06/02/2022 1508   LEUKOCYTESUR NEGATIVE 06/02/2022 1508   LEUKOCYTESUR Trace 05/11/2012 1400   Sepsis Labs Recent Labs  Lab 08/19/22 1426 08/20/22 0403 08/21/22 0523  WBC 8.4 10.0 8.4   Microbiology No results found for this or any previous visit (from the past 240 hour(s)).   Total time spend on discharging this patient, including the last patient exam, discussing the hospital stay, instructions for ongoing care as it relates to all pertinent caregivers, as well as preparing the medical discharge records, prescriptions, and/or referrals as applicable, is 45 minutes.    Enzo Bi, MD  [facility] 08/21/2022, 8:22 AM

## 2022-08-21 NOTE — TOC Progression Note (Signed)
Transition of Care [facility]) - Progression Note    Patient Details  Name: Cassandra [name] MRN: [id number] Date of Birth: 05/08/1933  Transition of Care [facility]) CM/SW [place], [place] Phone Number: 08/21/2022, 10:32 AM  Clinical Narrative:      Patient has been accepted to [facility] for PT/OT. PT start of care is 24-48 hours. OT is delayed two weeks. Patient accepted for both PT and OT.       Expected Discharge Plan and Services    HHPT/OT with [facility]     Expected Discharge Date: 08/21/22                                     Social Determinants of Health (SDOH) Interventions SDOH Screenings   Food Insecurity: No Food Insecurity (10/04/2018)  Transportation Needs: No Transportation Needs (10/04/2018)  Depression (PHQ2-9): Low Risk  (08/07/2022)  Financial Resource Strain: Low Risk  (10/04/2018)  Physical Activity: Unknown (10/04/2018)  Social Connections: Unknown (10/04/2018)  Tobacco Use: Medium Risk (08/19/2022)    Readmission Risk Interventions    03/16/2021    2:34 PM  Readmission Risk Prevention Plan  Transportation Screening Complete  PCP or Specialist Appt within 5-7 Days Complete  Home Care Screening Complete  Medication Review (RN CM) Complete

## 2022-08-21 NOTE — TOC Transition Note (Signed)
Transition of Care [facility]) - CM/SW Discharge Note   Patient Details  Name: NAZANIN KINNER MRN: [id number] Date of Birth: [birth year]  Transition of Care [facility]) CM/SW Contact:  Colen Darling, [place] Phone Number: 08/21/2022, 11:05 AM   Clinical Narrative:     Patient has been accepted to [facility] for PT/OT. TOC spoke to [facility] liaison.   PT start of care is 24-48 hours and the OT start of care is delayed by two weeks.  Final next level of care: [place]     Patient Goals and CMS Choice      [place]    Discharge Placement                Patient to be transferred to facility by: Family   Patient and family notified of of transfer: 08/21/22  Discharge Plan and Services                          HH Arranged: OT, PT [place] Agency: [place]     Representative spoke with at [place]: [place]  Social Determinants of Health ([place]) Interventions     Readmission Risk Interventions    03/16/2021    2:34 PM  Readmission Risk Prevention Plan  Transportation Screening Complete  PCP or Specialist Appt within 5-7 Days Complete  Home Care Screening Complete  Medication Review (RN CM) Complete

## 2022-08-21 NOTE — Progress Notes (Signed)
Occupational Therapy Treatment Patient Details Name: Cassandra [name] MRN: [id number] DOB: 03/02/1933 Today's Date: 08/21/2022   History of present illness 88 y/o female presented to ED on 08/19/22 for weakness and inability to walk. CT with no acute findings. MRI pending. PMH: T2DM, CAD, sleep apnea, hx of breast cancer, hx of MI, neuromuscular disorder, COPD, hx of visual hallucinations, CKD, HTN   OT comments  Ms Wangerin was seen for OT treatment on this date. Upon arrival to room pt reclined in bed, agreeable to tx. Pt requires MIN A exit bed, noted to have urine incontinence in bed, clean sheets provided. CGA + RW sit<>stand and ~20 ft mobility. MIN A to open packages, asssit for starting seals on butter/jam packets. Increased initiation of R hand in grooming tasks, reaches outside BOS with RUE to grasp tissue. Pt making good progress toward goals, will continue to follow POC. Discharge recommendation remains appropriate.     Recommendations for follow up therapy are one component of a multi-disciplinary discharge planning process, led by the attending physician.  Recommendations may be updated based on patient status, additional functional criteria and insurance authorization.    Follow Up Recommendations  Home health OT     Assistance Recommended at Discharge Frequent or constant Supervision/Assistance  Patient can return home with the following  A little help with walking and/or transfers;A little help with bathing/dressing/bathroom;Help with stairs or ramp for entrance   Equipment Recommendations  BSC/3in1    Recommendations for Other Services      Precautions / Restrictions Precautions Precautions: Fall Restrictions Weight Bearing Restrictions: No       Mobility Bed Mobility Overal bed mobility: Needs Assistance Bed Mobility: Supine to Sit     Supine to sit: Min assist          Transfers Overall transfer level: Needs assistance Equipment used: Rolling  walker (2 wheels) Transfers: Sit to/from Stand Sit to Stand: Min guard           General transfer comment: cues for R hand     Balance Overall balance assessment: Needs assistance Sitting-balance support: No upper extremity supported, Feet supported Sitting balance-Leahy Scale: Good     Standing balance support: Bilateral upper extremity supported Standing balance-Leahy Scale: Fair                             ADL either performed or assessed with clinical judgement   ADL Overall ADL's : Needs assistance/impaired                                       General ADL Comments: CGA + RW for toilet t/f. MIN A to open packages, asssit for starting seals on butter/jam packets. Increased initiation of R hand in grooming tasks, reaches outside BOS with RUE to grasp tissue.      Cognition Arousal/Alertness: Awake/alert Behavior During Therapy: Flat affect Overall Cognitive Status: Impaired/Different from baseline Area of Impairment: Safety/judgement                       Following Commands: Follows one step commands consistently Safety/Judgement: Decreased awareness of safety, Decreased awareness of deficits                    General Comments unable to read O2 accurately with pulse ox 2/2 nail polish  Pertinent Vitals/ Pain       Pain Assessment Pain Assessment: No/denies pain   Frequency  Min 3X/week        Progress Toward Goals  OT Goals(current goals can now be found in the care plan section)  Progress towards OT goals: Progressing toward goals  Acute Rehab OT Goals Patient Stated Goal: to go home OT Goal Formulation: With patient/family Time For Goal Achievement: 09/03/22 Potential to Achieve Goals: Good ADL Goals Pt Will Perform Grooming: with modified independence;standing Pt Will Perform Lower Body Dressing: with set-up;with supervision;sit to/from stand Pt Will Transfer to Toilet: with modified  independence;ambulating;regular height toilet  Plan Discharge plan remains appropriate;Frequency remains appropriate    Co-evaluation                 AM-PAC OT "6 Clicks" Daily Activity     Outcome Measure   Help from another person eating meals?: A Little Help from another person taking care of personal grooming?: A Little Help from another person toileting, which includes using toliet, bedpan, or urinal?: A Little Help from another person bathing (including washing, rinsing, drying)?: A Little Help from another person to put on and taking off regular upper body clothing?: A Little Help from another person to put on and taking off regular lower body clothing?: A Lot 6 Click Score: 17    End of Session    OT Visit Diagnosis: Other abnormalities of gait and mobility (R26.89);Muscle weakness (generalized) (M62.81)   Activity Tolerance Patient tolerated treatment well   Patient Left in chair;with call bell/phone within reach;with chair alarm set   Nurse Communication          Time: [id number] OT Time Calculation (min): 19 min  Charges: OT General Charges $OT Visit: 1 Visit OT Treatments $Self Care/Home Management : 8-22 mins  Dessie Coma, M.S. OTR/L  08/21/22, 9:28 AM  ascom [phone]

## 2022-08-23 ENCOUNTER — Ambulatory Visit
Admission: RE | Admit: 2022-08-23 | Discharge: 2022-08-23 | Disposition: A | Discharge status: Home / Self Care | Payer: Medicare Other | Source: Ambulatory Visit | Attending: Hospitalist | Admitting: Hospitalist

## 2022-08-23 DIAGNOSIS — I639 Cerebral infarction, unspecified: Secondary | ICD-10-CM | POA: Diagnosis present

## 2022-09-04 ENCOUNTER — Telehealth: Payer: Self-pay

## 2022-09-04 NOTE — Telephone Encounter (Signed)
Received call from patient's husband wanting to know when stop date for IV antibiotics (cefoxitin and amikacin) is. States they will run out tomorrow and follow up is not until 1/10. Will route to provider.   Beryle Flock, RN

## 2022-09-05 NOTE — Telephone Encounter (Signed)
Per Dr. Gale Journey, please extend x 1 more month.

## 2022-09-05 NOTE — Telephone Encounter (Signed)
I am almost positive that they should NOT be stopped, but I messaged Dr. Gale Journey to confirm. Will let you know once I hear back from him.

## 2022-09-05 NOTE — Telephone Encounter (Signed)
Staff spoke to patient husband [name] and made aware to continue antibiotics for one additional month. Staff also called [facility] team and spoke with Cassie and gave orders to extend antibiotic for one month. Order read back and verified   Eugenia Mcalpine, LPN

## 2022-09-05 NOTE — Telephone Encounter (Signed)
Thank you :)

## 2022-09-11 ENCOUNTER — Telehealth: Payer: Self-pay | Admitting: Pharmacist

## 2022-09-11 ENCOUNTER — Ambulatory Visit (INDEPENDENT_AMBULATORY_CARE_PROVIDER_SITE_OTHER): Payer: Medicare Other | Admitting: Internal Medicine

## 2022-09-11 ENCOUNTER — Other Ambulatory Visit: Payer: Self-pay

## 2022-09-11 ENCOUNTER — Encounter: Payer: Self-pay | Admitting: Internal Medicine

## 2022-09-11 VITALS — BP 143/87 | HR 78 | Temp 97.5°F

## 2022-09-11 DIAGNOSIS — M869 Osteomyelitis, unspecified: Secondary | ICD-10-CM | POA: Diagnosis not present

## 2022-09-11 DIAGNOSIS — A318 Other mycobacterial infections: Secondary | ICD-10-CM

## 2022-09-11 NOTE — Telephone Encounter (Signed)
Spoke to Amy at [facility] and gave verbal orders to stop IV amikacin. She just got a shipment today and will finish those doses and then stop after. Amy verbalized understanding. She will continue on IV cefoxitin, oral linezolid, and oral clofazimine as well.   Hailyn Zarr L. Dewan Emond, PharmD, BCIDP, AAHIVP, CPP Clinical Pharmacist Practitioner Infectious Diseases [facility] 09/11/2022, 4:33 PM
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
## 2022-09-11 NOTE — Telephone Encounter (Signed)
Patient picked up 2 bottles of clofazimine on 09/11/22. Supply should last for ~3 months. Next refill due around the middle of April.  Sophiya Morello L. Mitzy Naron, PharmD, BCIDP, AAHIVP, CPP Clinical Pharmacist Practitioner Infectious Diseases [facility] 09/11/2022, 4:37 PM

## 2022-09-11 NOTE — Patient Instructions (Signed)
Let's stop amikacin after this week   Will get a ct scan for you in about 3 weeks  See me again in 4 weeks  Will plan to take out picc line in around 2 months if things continue to go well  Ask your primary care provider to get you to see a lymphedema clinic for una-boot wrap of your legs rather than increasing the lasix

## 2022-09-12 ENCOUNTER — Ambulatory Visit: Payer: Medicare Other | Admitting: Gastroenterology

## 2022-09-12 ENCOUNTER — Other Ambulatory Visit: Payer: Self-pay

## 2022-09-12 NOTE — Progress Notes (Deleted)
Jonathon Bellows MD, MRCP(U.K) [address]  [place]  [place], [place] [address]  Main: [phone]  Fax: [phone]   Primary Care Physician: Idelle Crouch, MD  Primary Gastroenterologist:  Dr. Jonathon Bellows   No chief complaint on file.   HPI: Cassandra [name] is a 89 y.o. female   Summary of history :  Initially referred and seen in October 2023 for dysphagia.  She had already seen and evaluated by [facility] clinic gastroenterology in March 20 21-20 23 for the same.  Issues with dysphagia ongoing since 2019.  Recently seen at [facility] GI office in March 2023 March 2021 barium showed mild prominence of cricopharyngeus fold.  Plan at that time was for barium swallow.   11/14/2021 barium swallow showed study positive for aspiration nonspecific esophageal dysmotility noted small hiatal hernia patient refused barium tablet was recommended modified barium swallow.      At her initial visit,  she states that she has difficulty swallowing small pills to get lost when she tries to swallow but takes it with applesauce denies any coughing denies any actual pills that get lodged into her esophagus when she swallows.  She has to be on oxygen but is still waiting for her oxygen.  Denies any weight loss.  No other complaints presently.  Interval history 06/27/2022-09/12/2022  08/20/2022 underwent modified barium swallow, it was felt that her symptoms were only pronounced with dysphagia when she used a straw and it was mentioned that she should drink via cup and not use a straw  ***   Current Outpatient Medications  Medication Sig Dispense Refill   acetaminophen (TYLENOL) 500 MG tablet Take 500-1,000 mg by mouth every 6 (six) hours as needed for moderate pain.     albuterol (VENTOLIN HFA) 108 (90 Base) MCG/ACT inhaler Inhale 2 puffs into the lungs every 6 (six) hours as needed for wheezing or shortness of breath. 8 g 0   amikacin (AMIKIN) 1 GM/4ML SOLN injection Inject 800 mg into the vein  every Tuesday, Thursday, and Saturday at 6 PM.     amitriptyline (ELAVIL) 10 MG tablet Take 10 mg by mouth at bedtime.     cefOXitin (MEFOXIN) 2 g injection Inject 2 g into the vein every 12 (twelve) hours.     cholecalciferol (VITAMIN D3) 25 MCG (1000 UNIT) tablet Take 1,000 Units by mouth daily.     CLOFAZIMINE PO Take 100 mg by mouth every morning.     clopidogrel (PLAVIX) 75 MG tablet Take 1 tablet (75 mg total) by mouth daily. 30 tablet 2   desvenlafaxine (PRISTIQ) 50 MG 24 hr tablet Take 50 mg by mouth every evening.     ELIQUIS 5 MG TABS tablet Take 5 mg by mouth 2 (two) times daily.     furosemide (LASIX) 20 MG tablet Take 20 mg by mouth daily.     insulin aspart (NOVOLOG FLEXPEN) 100 UNIT/ML FlexPen Hold until followup with outpatient provider. 15 mL 11   insulin glargine (LANTUS SOLOSTAR) 100 UNIT/ML Solostar Pen Hold until followup with outpatient provider. 15 mL 11   levothyroxine (SYNTHROID) 100 MCG tablet Take 100 mcg by mouth every morning.     linezolid (ZYVOX) 600 MG tablet Take 1 tablet (600 mg total) by mouth daily. (Patient not taking: Reported on 09/11/2022) 30 tablet 5   metFORMIN (GLUCOPHAGE) 500 MG tablet Take 500 mg by mouth 2 (two) times daily with a meal.     metoCLOPramide (REGLAN) 5 MG tablet Take  5 mg by mouth 2 (two) times daily before a meal.     metoprolol tartrate (LOPRESSOR) 25 MG tablet Take 1 tablet (25 mg total) by mouth 2 (two) times daily. 60 tablet 2   Multiple Vitamin (MULTIVITAMIN WITH MINERALS) TABS tablet Take 1 tablet by mouth daily. 30 tablet 0   nitroGLYCERIN (NITROSTAT) 0.4 MG SL tablet Place 1 tablet (0.4 mg total) under the tongue every 5 (five) minutes as needed for chest pain. 90 tablet 0   rosuvastatin (CRESTOR) 10 MG tablet Take 10 mg by mouth at bedtime.     telmisartan (MICARDIS) 40 MG tablet Take 40 mg by mouth daily.     traZODone (DESYREL) 50 MG tablet Take 50 mg by mouth at bedtime.     No current facility-administered medications for  this visit.    Allergies as of 09/12/2022 - Review Complete 09/11/2022  Allergen Reaction Noted   Baclofen Other (See Comments) 06/07/2022   Levofloxacin Other (See Comments) 07/10/2021    ROS:  General: Negative for anorexia, weight loss, fever, chills, fatigue, weakness. ENT: Negative for hoarseness, difficulty swallowing , nasal congestion. CV: Negative for chest pain, angina, palpitations, dyspnea on exertion, peripheral edema.  Respiratory: Negative for dyspnea at rest, dyspnea on exertion, cough, sputum, wheezing.  GI: See history of present illness. GU:  Negative for dysuria, hematuria, urinary incontinence, urinary frequency, nocturnal urination.  Endo: Negative for unusual weight change.    Physical Examination:   LMP  (LMP Unknown) Comment: age 59  General: Well-nourished, well-developed in no acute distress.  Eyes: No icterus. Conjunctivae pink. Mouth: Oropharyngeal mucosa moist and pink , no lesions erythema or exudate. Lungs: Clear to auscultation bilaterally. Non-labored. Heart: Regular rate and rhythm, no murmurs rubs or gallops.  Abdomen: Bowel sounds are normal, nontender, nondistended, no hepatosplenomegaly or masses, no abdominal bruits or hernia , no rebound or guarding.   Extremities: No lower extremity edema. No clubbing or deformities. Neuro: Alert and oriented x 3.  Grossly intact. Skin: Warm and dry, no jaundice.   Psych: Alert and cooperative, normal mood and affect.   Imaging Studies: CT HEAD WO CONTRAST (5MM)  Result Date: 08/23/2022 CLINICAL DATA:  Stroke 3 days ago, follow-up EXAM: CT HEAD WITHOUT CONTRAST TECHNIQUE: Contiguous axial images were obtained from the base of the skull through the vertex without intravenous contrast. RADIATION DOSE REDUCTION: This exam was performed according to the departmental dose-optimization program which includes automated exposure control, adjustment of the mA and/or kV according to patient size and/or use of  iterative reconstruction technique. COMPARISON:  08/20/2022 FINDINGS: Brain: There is progressive hypodensity within the left frontal subcortical white matter at the convexity, consistent with evolution of presumed acute infarct. Preservation of gray-white differentiation in this region. There are scattered hypodensities elsewhere throughout the bilateral periventricular and right frontal subcortical white matter consistent with chronic small vessel ischemic changes. No evidence of acute hemorrhage. Lateral ventricles and midline structures are otherwise unremarkable. No acute extra-axial fluid collections. No mass effect. Vascular: No hyperdense vessel or unexpected calcification. Skull: Normal. Negative for fracture or focal lesion. Sinuses/Orbits: No acute finding. Other: None. IMPRESSION: 1. Progressive hypodensity within the subcortical white matter at the left frontal convexity, consistent with evolution of presumed acute small vessel ischemic change within this region. 2. Chronic small vessel ischemic changes elsewhere throughout the periventricular and right frontal subcortical white matter, stable. 3. No evidence of acute hemorrhage. Electronically Signed   By: Randa Ngo M.D.   On: 08/23/2022 23:18  CT HEAD WO CONTRAST (5MM)  Result Date: 08/20/2022 CLINICAL DATA:  Neuro deficit, right-sided weakness. Follow-up head CT. EXAM: CT HEAD WITHOUT CONTRAST TECHNIQUE: Contiguous axial images were obtained from the base of the skull through the vertex without intravenous contrast. RADIATION DOSE REDUCTION: This exam was performed according to the departmental dose-optimization program which includes automated exposure control, adjustment of the mA and/or kV according to patient size and/or use of iterative reconstruction technique. COMPARISON:  CT head 1 day prior FINDINGS: Brain: There is no acute intracranial hemorrhage, extra-axial fluid collection, or acute infarct. Parenchymal volume is stable. The  ventricles are stable in size. Gray-white differentiation is preserved. Background chronic small-vessel ischemic change is stable. 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 There is no mass lesion.  There is no mass effect or midline shift. Vascular: There is calcification of the bilateral carotid siphons. Skull: Normal. Negative for fracture or focal lesion. Sinuses/Orbits: The imaged paranasal sinuses are clear. Bilateral lens implants are in place. The globes and orbits are otherwise unremarkable. Other: None. IMPRESSION: Stable noncontrast head CT with no acute intracranial pathology. Electronically Signed   By: Valetta Mole M.D.   On: 08/20/2022 15:17   ECHOCARDIOGRAM COMPLETE  Result Date: 08/20/2022    ECHOCARDIOGRAM REPORT   Patient Name:   RAYSSA STRAMEL Date of Exam: 08/20/2022 Medical Rec #:  [id number]          Height:       59.0 in Accession #:    [id number]         Weight:       142.0 lb Date of Birth:  04-13-1933          BSA:          1.595 m Patient Age:    29 years           BP:           184/112 mmHg Patient Gender: F                  HR:           79 bpm. Exam Location:  [facility] Procedure: 2D Echo, Color Doppler and Cardiac Doppler Indications:     Stroke-like symptom  History:         Patient has prior history of Echocardiogram examinations, most                  recent 06/03/2022. CAD, Pacemaker; Risk Factors:Diabetes and                  Sleep Apnea.  Sonographer:     Charmayne Sheer Referring Phys:  [id number] Charlesetta Ivory GONFA Diagnosing Phys: Neoma Laming  Sonographer Comments: Suboptimal apical window and suboptimal subcostal window. Image acquisition challenging due to breast implants. IMPRESSIONS  1. Left ventricular ejection fraction, by estimation, is 45 to 50%. The left ventricle has mildly decreased function. The left ventricle demonstrates global hypokinesis. The left ventricular internal cavity size was mildly dilated. There is moderate left ventricular  hypertrophy. Left ventricular diastolic parameters are consistent with Grade II diastolic dysfunction (pseudonormalization).  2. Right ventricular systolic function is mildly reduced. The right ventricular size is moderately enlarged.  3. Left atrial size was moderately dilated.  4. Right atrial size was moderately dilated.  5. The mitral valve is normal in structure. Mild to moderate mitral valve regurgitation. No evidence of mitral stenosis.  6. Tricuspid valve regurgitation is moderate to severe.  7. The aortic valve is calcified. Aortic valve regurgitation is mild to moderate. Aortic valve sclerosis/calcification is present, without any evidence of aortic stenosis.  8. The inferior vena cava is normal in size with greater than 50% respiratory variability, suggesting right atrial pressure of 3 mmHg. Conclusion(s)/Recommendation(s): Severe pulmonary HTN. FINDINGS  Left Ventricle: Left ventricular ejection fraction, by estimation, is 45 to 50%. The left ventricle has mildly decreased function. The left ventricle demonstrates global hypokinesis. The left ventricular internal cavity size was mildly dilated. There is  moderate left ventricular hypertrophy. Left ventricular diastolic parameters are consistent with Grade II diastolic dysfunction (pseudonormalization). Right Ventricle: The right ventricular size is moderately enlarged. No increase in right ventricular wall thickness. Right ventricular systolic function is mildly reduced. Left Atrium: Left atrial size was moderately dilated. Right Atrium: Right atrial size was moderately dilated. Pericardium: There is no evidence of pericardial effusion. Mitral Valve: The mitral valve is normal in structure. Mild to moderate mitral valve regurgitation. No evidence of mitral valve stenosis. Tricuspid Valve: The tricuspid valve is normal in structure. Tricuspid valve regurgitation is moderate to severe. No evidence of tricuspid stenosis. Aortic Valve: The aortic valve is  calcified. Aortic valve regurgitation is mild to moderate. Aortic valve sclerosis/calcification is present, without any evidence of aortic stenosis. Aortic valve mean gradient measures 4.0 mmHg. Aortic valve peak gradient measures 7.2 mmHg. Aortic valve area, by VTI measures 1.41 cm. Pulmonic Valve: The pulmonic valve was normal in structure. Pulmonic valve regurgitation is mild to moderate. No evidence of pulmonic stenosis. Aorta: The aortic root is normal in size and structure. Venous: The inferior vena cava is normal in size with greater than 50% respiratory variability, suggesting right atrial pressure of 3 mmHg. IAS/Shunts: No atrial level shunt detected by color flow Doppler.  LEFT VENTRICLE PLAX 2D LVIDd:         4.50 cm   Diastology LVIDs:         3.40 cm   LV e' medial:    5.22 cm/s LV PW:         1.00 cm   LV E/e' medial:  21.8 LV IVS:        1.20 cm   LV e' lateral:   8.59 cm/s LVOT diam:     1.80 cm   LV E/e' lateral: 13.3 LV SV:         34 LV SV Index:   21 LVOT Area:     2.54 cm  RIGHT VENTRICLE RV Basal diam:  4.00 cm TAPSE (M-mode): 2.0 cm LEFT ATRIUM           Index        RIGHT ATRIUM           Index LA diam:      4.40 cm 2.76 cm/m   RA Area:     13.60 cm LA Vol (A4C): 47.1 ml 29.54 ml/m  RA Volume:   31.10 ml  19.50 ml/m  AORTIC VALVE                    PULMONIC VALVE AV Area (Vmax):    1.20 cm     PV Vmax:          0.90 m/s AV Area (Vmean):   1.34 cm     PV Vmean:         64.400 cm/s AV Area (VTI):     1.41 cm     PV VTI:  0.166 m AV Vmax:           134.00 cm/s  PV Peak grad:     3.3 mmHg AV Vmean:          90.500 cm/s  PV Mean grad:     2.0 mmHg AV VTI:            0.240 m      PR End Diast Vel: 8.76 msec AV Peak Grad:      7.2 mmHg AV Mean Grad:      4.0 mmHg LVOT Vmax:         63.10 cm/s LVOT Vmean:        47.500 cm/s LVOT VTI:          0.133 m LVOT/AV VTI ratio: 0.55  AORTA Ao Root diam: 3.00 cm MITRAL VALVE                TRICUSPID VALVE MV Area (PHT): 5.38 cm     TR Peak  grad:   78.1 mmHg MV Decel Time: 141 msec     TR Vmax:        442.00 cm/s MV E velocity: 114.00 cm/s MV A velocity: 96.90 cm/s   SHUNTS MV E/A ratio:  1.18         Systemic VTI:  0.13 m                             Systemic Diam: 1.80 cm Neoma Laming Electronically signed by Neoma Laming Signature Date/Time: 08/20/2022/12:13:03 PM    Final    CT ANGIO HEAD NECK W WO CM W PERF (CODE STROKE)  Result Date: 08/19/2022 CLINICAL DATA:  Weakness and difficulty walking. EXAM: CT ANGIOGRAPHY HEAD AND NECK CT PERFUSION BRAIN TECHNIQUE: Multidetector CT imaging of the head and neck was performed using the standard protocol during bolus administration of intravenous contrast. Multiplanar CT image reconstructions and MIPs were obtained to evaluate the vascular anatomy. Carotid stenosis measurements (when applicable) are obtained utilizing NASCET criteria, using the distal internal carotid diameter as the denominator. Multiphase CT imaging of the brain was performed following IV bolus contrast injection. Subsequent parametric perfusion maps were calculated using RAPID software. RADIATION DOSE REDUCTION: This exam was performed according to the departmental dose-optimization program which includes automated exposure control, adjustment of the mA and/or kV according to patient size and/or use of iterative reconstruction technique. CONTRAST:  67m OMNIPAQUE IOHEXOL 350 MG/ML SOLN COMPARISON:  Same-day noncontrast CT head, carotid Doppler 06/03/2022, CT neck with contrast 04/23/2012. FINDINGS: CTA NECK FINDINGS Aortic arch: There is calcified plaque in the aortic arch and at the origins of the major branch vessels. There is severe stenosis of the proximal left subclavian artery. Right carotid system: There is moderate stenosis of the origin of the right common carotid artery. There is scattered calcified plaque throughout the remainder of the common carotid artery without hemodynamically significant stenosis or occlusion. There  is bulky calcified plaque in the proximal internal carotid artery resulting in approximately 65-70% stenosis. The distal internal carotid artery is patent. The external carotid artery is patent. There is no dissection or aneurysm. Left carotid system: There is mixed plaque in the proximal left internal carotid artery resulting in less than 50% stenosis. The remainder of the common carotid artery is patent with scattered plaque but no hemodynamically significant stenosis. There is minimal calcified plaque in the proximal internal carotid artery and mild calcified plaque distally without hemodynamically  significant stenosis. The external carotid artery is occluded. 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 There is no new dissection or aneurysm. Vertebral arteries: There is severe stenosis/occlusion of the proximal left internal carotid artery. The remainder of the left vertebral artery is diminutive in caliber but appears patent. The dominant right vertebral artery is patent without hemodynamically significant stenosis or occlusion. There is no aneurysm or dissection. Skeleton: There is no acute osseous abnormality or suspicious osseous lesion. There is no visible canal hematoma. Other neck: The soft tissues of the neck are unremarkable. Upper chest: There is marked scarring and architectural distortion in the lung apices, similar to the prior CT chest. Review of the MIP images confirms the above findings CTA HEAD FINDINGS Anterior circulation: There is calcified plaque in the intracranial ICAs without greater than mild stenosis bilaterally. The bilateral MCAs are patent, without proximal stenosis or occlusion. The bilateral ACAs are patent, without proximal stenosis or occlusion. The right A1 segment is diminutive, likely a developmental variant. There is no aneurysm or AVM. Posterior circulation: The bilateral V4 segments are patent. The basilar artery is patent. The major  cerebellar arteries are patent. There is a fetal origin of the right PCA. The right PCA is patent, without proximal stenosis or occlusion. There is also a predominantly fetal origin of the left PCA with moderate to severe stenosis at the PCOM/P2 junction (10-101). The remainder of the PCAs patent, without other proximal high-grade stenosis or occlusion There is no aneurysm or AVM. Venous sinuses: Suboptimally evaluated due to bolus timing but grossly patent. Anatomic variants: As above. Review of the MIP images confirms the above findings CT Brain Perfusion Findings: ASPECTS: 10 CBF (<30%) Volume: 48m Perfusion (Tmax>6.0s) volume: 42mMismatch Volume: 4079mnfarction Location:No infarct core is identified. The penumbra described above is scattered in the bilateral cerebral and cerebellar hemispheres, favored artifactual. IMPRESSION: 1. No emergent large vessel occlusion. 2. No infarct core identified. Scattered penumbra is identified in both cerebral and cerebellar hemispheres, favored artifactual. MRI may be considered for better evaluation. 3. Bulky plaque in the proximal right internal carotid artery resulting in approximately 65-70% stenosis. 4. Mild plaque in the left common and internal carotid arteries without hemodynamically significant stenosis or occlusion. 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 5. Occluded left external carotid artery. 6. Severe stenosis of the origin of the non dominant left vertebral artery which is diminutive in caliber throughout the remainder of the neck. Patent dominant right vertebral artery. 7. Fetal left PCA with severe stenosis of the PCOM/P2 junction. 8. Mild plaque in the intracranial ICAs without hemodynamically significant stenosis or occlusion. 9. Severe stenosis of the left subclavian artery. These results were called by telephone at the time of interpretation on 08/19/2022 at 3:19 pm to [facility] these results. Electronically Signed   By: PetValetta MoleD.   On: 08/19/2022 15:36   CT HEAD CODE STROKE WO CONTRAST  Result Date: 08/19/2022 CLINICAL DATA:  Code stroke.  Stroke.  Last known normal 0930 hours EXAM: CT HEAD WITHOUT CONTRAST TECHNIQUE: Contiguous axial images were obtained from the base of the skull through the vertex without intravenous contrast. RADIATION DOSE REDUCTION: This exam was performed according to the departmental dose-optimization program which includes automated exposure control, adjustment of the mA and/or kV according to patient size and/or use of iterative reconstruction technique. COMPARISON:  09/02/2021  FINDINGS: Brain: Age related volume loss without subjective lobar predominance. Old small vessel infarctions affect the cerebellum. Probable chronic small-vessel change of the pons. Cerebral hemispheres show chronic small-vessel ischemic change of the white matter. No cortical or large vessel territory infarction. No mass lesion, hemorrhage, hydrocephalus or extra-axial collection. Vascular: There is atherosclerotic calcification of the major vessels at the base of the brain. Skull: Negative Sinuses/Orbits: Clear/normal Other: None ASPECTS ([place] Stroke Program Early CT Score) - Ganglionic level infarction (caudate, lentiform nuclei, internal capsule, insula, M1-M3 cortex): 7 - Supraganglionic infarction (M4-M6 cortex): 3 Total score (0-10 with 10 being normal): 10 IMPRESSION: 1. No acute CT finding. Chronic small-vessel ischemic changes of the cerebral hemispheric white matter and cerebellum. 2. Aspects is 10. These results were communicated to Dr. Quinn Axe at 2:38 pm on 08/19/2022 by text page via the [facility] messaging system. Electronically Signed   By: Nelson Chimes M.D.   On: 08/19/2022 14:39   DG SWALLOW FUNC OP MEDICARE SPEECH PATH  Result Date: 08/16/2022 CLINICAL DATA:  Dysphagia. EXAM: MODIFIED BARIUM SWALLOW TECHNIQUE:  Different consistencies of barium were administered orally to the patient by the Speech Pathologist. Imaging of the pharynx was performed in the lateral projection. Cassandra [name] was present in the fluoroscopy room during this study, which was supervised and interpreted by Valetta Mole, MD. FLUOROSCOPY: Radiation Exposure Index (as provided by the fluoroscopic device): 5.6 mGy Kerma COMPARISON:  None Available. FINDINGS: Vestibular  Penetration:  Positive for laryngeal penetration. Aspiration:  None seen. Other:  None. IMPRESSION: Laryngeal penetration without frank aspiration. Please refer to the Speech Pathologists report for complete details and recommendations. Electronically Signed   By: Valetta Mole M.D.   On: 08/16/2022 15:03    Assessment and Plan:   MAKENNAH SHAMBAUGH is a 89 y.o. y/o female here to follow-up for dysphagia going on for at least 4 years.she had a barium swallow by [facility] clinic gastroenterology in March 2023 that showed aspiration, patient refused to swallow barium tablet- lumen of the esophagus had no abnormalities and rapid clearance of barium seen from the esophagus.   At her last visit I obtained a modified barium swallow and the recommendations of the speech and language pathologist suggested that the patient only had issues with swallowing when using a straw and no issues when drinking liquids from a cup and was advised to stop using a straw.   Follow up as needed  Dr Jonathon Bellows  MD,MRCP [facility])

## 2022-09-20 ENCOUNTER — Ambulatory Visit
Admission: RE | Admit: 2022-09-20 | Discharge: 2022-09-20 | Disposition: A | Discharge status: Home / Self Care | Payer: Medicare Other | Source: Ambulatory Visit | Attending: Internal Medicine | Admitting: Internal Medicine

## 2022-09-20 DIAGNOSIS — A318 Other mycobacterial infections: Secondary | ICD-10-CM | POA: Diagnosis present

## 2022-09-20 DIAGNOSIS — M869 Osteomyelitis, unspecified: Secondary | ICD-10-CM | POA: Diagnosis present

## 2022-09-23 ENCOUNTER — Telehealth: Payer: Self-pay | Admitting: Pharmacist

## 2022-09-23 NOTE — Telephone Encounter (Signed)
Patient is currently taking IV cefoxitin, oral linezolid, and oral clofazimine for mycobacterium abscessus osteomyelitis. Her husband, [name], called me today and stated that Cassandra [name] has had a rash on her back for 3 days. She is using desitin, which helps. No new medications, detergents, shampoos, lotions, body wash, etc. It isn't particularly bothersome and does not itch very much. I doubt it is from our medications since she has been on them for 3+ months. Advised to continue the course and keep taking all three medications and to call us if the rash worsens. He verbalized understanding.  Kaid Seeberger L. Ciin Brazzel, PharmD, BCIDP, AAHIVP, CPP Clinical Pharmacist Practitioner Infectious Diseases [facility] 09/23/2022, 3:34 PM

## 2022-10-03 ENCOUNTER — Telehealth: Payer: Self-pay | Admitting: Pharmacist

## 2022-10-03 ENCOUNTER — Other Ambulatory Visit: Payer: Self-pay | Admitting: Pharmacist

## 2022-10-03 DIAGNOSIS — L299 Pruritus, unspecified: Secondary | ICD-10-CM

## 2022-10-03 MED ORDER — HYDROXYZINE HCL 10 MG PO TABS: 10.0000 mg | ORAL_TABLET | Freq: Three times a day (TID) | ORAL | 0 refills | Status: DC | PRN

## 2022-10-03 NOTE — Telephone Encounter (Signed)
Cassandra [name] called me today stating that she is still having intense itching during the day and night. Her PICC line itches and so does her back. She states it keeps her up at night. I will send in hydroxyzine for her to take to see if it helps. Advised her how to take it. She has a follow up scheduled with Dr. Gale Journey next week.   [name] L. Harjit Leider, PharmD, BCIDP, AAHIVP, CPP Clinical Pharmacist Practitioner Infectious Diseases [place] [facility] 10/03/2022, 10:53 AM

## 2022-10-09 ENCOUNTER — Ambulatory Visit (INDEPENDENT_AMBULATORY_CARE_PROVIDER_SITE_OTHER): Payer: Medicare Other | Admitting: Internal Medicine

## 2022-10-09 ENCOUNTER — Other Ambulatory Visit (HOSPITAL_COMMUNITY): Payer: Self-pay

## 2022-10-09 ENCOUNTER — Other Ambulatory Visit: Payer: Self-pay

## 2022-10-09 ENCOUNTER — Telehealth: Payer: Self-pay

## 2022-10-09 ENCOUNTER — Encounter: Payer: Self-pay | Admitting: Internal Medicine

## 2022-10-09 VITALS — BP 119/58 | HR 63 | Temp 97.0°F

## 2022-10-09 DIAGNOSIS — A318 Other mycobacterial infections: Secondary | ICD-10-CM | POA: Diagnosis not present

## 2022-10-09 DIAGNOSIS — M869 Osteomyelitis, unspecified: Secondary | ICD-10-CM | POA: Diagnosis not present

## 2022-10-09 NOTE — Telephone Encounter (Signed)
Per Dr. Gale Journey patient can end IV antibiotics today. Patient reported at her appointment today tunneled picc is no longer functioning properly and she is unable to infuse medications.  Patient scheduled with IR on 10/10/22 @ 2 pm to have tunneled CVC removed. Patient and patient's husband aware of appointment information.  I have also sent a community message to the [employer] team informing them that patient will stop IV antibiotics. Micaiah Litle T Brooks Sailors

## 2022-10-09 NOTE — Patient Instructions (Signed)
Will schedule for the radiologist to remove the picc line   Continue the linezolid and clofazimine antibiotics (white and red pill)  Cassie will ask the pharmaceutical company for omadocycline again. If this is available will start you on that and stop the linezolid  My plan is at least 1 more month of treatment, or until the wounds close, whichever is longer  See me in 6 weeks
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
## 2022-10-10 ENCOUNTER — Ambulatory Visit (HOSPITAL_COMMUNITY)
Admission: RE | Admit: 2022-10-10 | Discharge: 2022-10-10 | Disposition: A | Discharge status: Home / Self Care | Payer: Medicare Other | Source: Ambulatory Visit | Attending: Internal Medicine | Admitting: Internal Medicine

## 2022-10-10 DIAGNOSIS — M869 Osteomyelitis, unspecified: Secondary | ICD-10-CM

## 2022-10-10 DIAGNOSIS — A318 Other mycobacterial infections: Secondary | ICD-10-CM

## 2022-10-10 DIAGNOSIS — Z452 Encounter for adjustment and management of vascular access device: Secondary | ICD-10-CM | POA: Diagnosis not present

## 2022-10-10 HISTORY — PX: IR REMOVAL TUN CV CATH W/O FL: IMG2289

## 2022-10-10 MED ORDER — LIDOCAINE HCL 1 % IJ SOLN
INTRAMUSCULAR | Status: AC
  Administered 2022-10-10: 10 mL
  Filled 2022-10-10: qty 20

## 2022-10-11 ENCOUNTER — Other Ambulatory Visit: Payer: Self-pay | Admitting: Pharmacist

## 2022-10-11 ENCOUNTER — Other Ambulatory Visit (HOSPITAL_COMMUNITY): Payer: Self-pay

## 2022-10-11 MED ORDER — OMADACYCLINE TOSYLATE 150 MG PO TABS
300.0000 mg | ORAL_TABLET | Freq: Every day | ORAL | 2 refills | Status: DC
  Filled 2022-10-11 – 2022-10-14 (×2): qty 56, 28d supply, fill #0

## 2022-10-14 ENCOUNTER — Other Ambulatory Visit (HOSPITAL_COMMUNITY): Payer: Self-pay

## 2022-10-14 ENCOUNTER — Telehealth: Payer: Self-pay

## 2022-10-14 NOTE — Telephone Encounter (Signed)
[facility] Patient Advocate Encounter  Completed and sent [facility] Patient Assistance application for Elesa Hacker for this patient who is insured.    Patient assistance phone number for follow up is [phone].   Fax # [phone]  This encounter will be updated until final determination.   Ileene Patrick, [facility] Patient [facility] for Infectious Disease Phone: [phone] Fax:  [phone]

## 2022-10-15 ENCOUNTER — Telehealth: Payer: Self-pay

## 2022-10-15 NOTE — Telephone Encounter (Signed)
[facility] Patient Advocate Encounter  Completed and sent [facility] Patient Assistance application for Elesa Hacker for this patient who is insured.    Patient is approved for a 14 days supply of Nuzyra 28 tablets.  Medication will be shipped to the office on or after 10/15/22   Ileene Patrick, [place] Patient [facility] for Infectious Disease Phone: [phone] Fax:  [phone]

## 2022-10-17 ENCOUNTER — Telehealth: Payer: Self-pay

## 2022-10-17 NOTE — Telephone Encounter (Signed)
[facility] Patient Advocate Encounter  Patient's medications have been couriered to [facility] from [facility] and will be picked up .  14 day supply of Nuzyra 150 mg.  Cassandra [name] , [facility] Patient [facility] for Infectious Disease Phone: [phone] Fax:  [phone]

## 2022-10-21 ENCOUNTER — Other Ambulatory Visit: Payer: Self-pay

## 2022-10-21 ENCOUNTER — Ambulatory Visit (INDEPENDENT_AMBULATORY_CARE_PROVIDER_SITE_OTHER): Payer: Medicare Other | Admitting: Pharmacist

## 2022-10-21 DIAGNOSIS — A318 Other mycobacterial infections: Secondary | ICD-10-CM | POA: Diagnosis not present

## 2022-10-21 DIAGNOSIS — M8618 Other acute osteomyelitis, other site: Secondary | ICD-10-CM

## 2022-10-21 DIAGNOSIS — M869 Osteomyelitis, unspecified: Secondary | ICD-10-CM

## 2022-10-21 DIAGNOSIS — Z7189 Other specified counseling: Secondary | ICD-10-CM
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
## 2022-10-22 ENCOUNTER — Telehealth: Payer: Self-pay

## 2022-10-22 NOTE — Telephone Encounter (Signed)
Patients husband, [name], called asking about the best time to take [name]. She is diabetic and cannot wait 2 hours to eat after waking up in morning due to low blood sugar. He reports that she typically eats lunch around 2-3pm. Advised that she eat breakfast, wait 4 hours to take the medication then wait 2 more hours before eating lunch. He agreed to try that.

## 2022-10-29 ENCOUNTER — Other Ambulatory Visit (HOSPITAL_COMMUNITY): Payer: Self-pay

## 2022-10-31 ENCOUNTER — Telehealth: Payer: Self-pay

## 2022-10-31 NOTE — Telephone Encounter (Signed)
Patient's husband, [name], called saying it is difficult for her to take her medication because of the requirements of taking without food due to her diabetes. He said she is not having low blood sugars but it is difficult to wait to eat. I instructed him to have her take the medication 4 hours after dinner and then she should have no trouble fasting.

## 2022-11-01 ENCOUNTER — Telehealth: Payer: Self-pay | Admitting: Pharmacist

## 2022-11-01 NOTE — Telephone Encounter (Signed)
Maybe we can forget about the fasting and see?

## 2022-11-01 NOTE — Telephone Encounter (Signed)
Patient's husband, [name], called this morning regarding omadacycline. This is the 4th time he has called pharmacy to discuss in the last week and a half. He states that Silbia is taking too many medications and the new one "isn't working out too good". He states it is hard for her to fast for 4 hours and then wait 2 hours to eat after administration. We first told him to administer the omadacycline first thing in the morning (she would fast over night) and wait 1.5-2 hours to eat. She tried that and he said she refused to wait to eat. We then instructed her to take it 2 hours after dinner and before bed as she eats dinner around 5pm and goes to bed at 8-9pm. He tried that x 1 night and called this morning and said it isn't working. We repeatedly emphasized how important this medication is to her infection. She is getting it free of charge in terms of samples as it is about $2000/month through her insurance. He again said that she is taking too many medications, and I advised him that this medication (plus the clofazimine and linezolid) were her best options and chances with this type of infection. I finally said I would relay this information to Dr. Gale Journey.   Lesli Issa L. Jacquelynne Guedes, PharmD, BCIDP, AAHIVP, CPP Clinical Pharmacist Practitioner [facility] for Infectious Disease 11/01/2022, 1:05 PM

## 2022-11-01 NOTE — Telephone Encounter (Signed)
Yea, there is a study of taking it with orange juice and toast. I can see if that works. I honestly just think she doesn't want to take it no matter what I tell them.

## 2022-11-02 NOTE — Telephone Encounter (Signed)
Yeah i mean i like the morning admin  I mean she loose some more drug with 2 and 2 for dinner but that is probably the best options   The nonfasting part is last resort unless the juice things work out   I agree time made her forget how nasty her leg looked

## 2022-11-05 ENCOUNTER — Telehealth: Payer: Self-pay | Admitting: Pharmacist

## 2022-11-05 ENCOUNTER — Other Ambulatory Visit: Payer: Self-pay | Admitting: Pharmacist

## 2022-11-05 DIAGNOSIS — M869 Osteomyelitis, unspecified: Secondary | ICD-10-CM

## 2022-11-05 MED ORDER — LINEZOLID 600 MG PO TABS: 600.0000 mg | ORAL_TABLET | Freq: Every day | ORAL | 5 refills | Status: DC

## 2022-11-05 NOTE — Telephone Encounter (Signed)
Received VM from patient's husband, [name], stating that Shenette is getting acid reflux with Nuzyra administration and is not going to take it anymore. Called and spoke with Lil this morning. She states that she is taking it at 1030pm at night and she had severe acid reflux and vomiting when she took it on Sunday night. She skipped her dose last night. At first, she refused to continue taking it. I explained (again) how important this medication was for her and that it is really the only option we have left. Elesa Hacker needs to be taken in a fasting state as the AUC and Cmax are dramatically decreased when taking with any food close to administration. Taking with orange juice and toast resulted in a 19-25% reduction in systemic exposure. This is not ideal but I asked if she could take it with acid reflux medication and a piece of bread or toast to help with the issue. She was hesitant at first and stated that no one has told her why she has to continue taking these antibiotics and if her foot was getting any better.    I reviewed her CT from January and told her that it was stable from her previous one in December- not worse but not significantly better. I also discussed the issue of antibiotic penetration into the bone and seriousness of this bacteria/infection (Mycobacterium abscessus). This has been discussed with her before but she often forgets what we tell her, unfortunately. After much discussion, she agreed to try taking it with acid reflux medication and bread. She also mentioned to me that her ankle is draining now and has been increasing the last few weeks. She describes the drainage of completely clear and not yellow/green and no pus seen. I told her that I would let Dr. Gale Journey and Dr. Linus Salmons know.   I talked to [place] next and advised him to pick up Pepcid for her to take with her [place]. He asked if she needed to continue the linezolid, and I again discussed how important it was for her to continue all three  antibiotics (nuzyra, linezolid, and clofazimine). I faxed and electronically sent refills of her linezolid to [facility] and he will go today to get her Pepcid and refill. She has an appt with Dr. Linus Salmons next week. Will continue to help her through this process.  Breylon Sherrow L. Emmajean Ratledge, PharmD, BCIDP, AAHIVP, CPP Clinical Pharmacist Practitioner Infectious Diseases [facility] 11/05/2022, 10:45 AM

## 2022-11-05 NOTE — Telephone Encounter (Signed)
Yes, adamantly unfortunately.

## 2022-11-13 ENCOUNTER — Ambulatory Visit: Payer: Medicare Other | Admitting: Pharmacist

## 2022-11-13 ENCOUNTER — Encounter: Payer: Self-pay | Admitting: Internal Medicine

## 2022-11-13 ENCOUNTER — Other Ambulatory Visit: Payer: Self-pay

## 2022-11-13 ENCOUNTER — Ambulatory Visit (INDEPENDENT_AMBULATORY_CARE_PROVIDER_SITE_OTHER): Payer: Medicare Other | Admitting: Internal Medicine

## 2022-11-13 VITALS — BP 131/87 | HR 87 | Resp 16 | Ht 59.0 in | Wt 142.0 lb

## 2022-11-13 DIAGNOSIS — M86672 Other chronic osteomyelitis, left ankle and foot: Secondary | ICD-10-CM

## 2022-11-13 DIAGNOSIS — A318 Other mycobacterial infections: Secondary | ICD-10-CM | POA: Diagnosis not present

## 2022-11-14 ENCOUNTER — Encounter: Payer: Self-pay | Admitting: Internal Medicine
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
## 2022-11-14 NOTE — Assessment & Plan Note (Signed)
After an extensive review of the history I had a long discussion with the treatment of this infection and prolonged courses of antibiotics with no end in sight.  She brought up the idea that amputation may be better and I agreed with that and recommended she return to Dr. Mylo Red to discuss this option and what it would entail (ie BKA, AKA?).  I reiterated that amputation is the only likely cure of this and it is recommended by me as it has been by previous providers.  She will contact Dr. Mylo Red and would like to return to see Dr. Gale Journey.    I have personally spent 40 minutes involved in face-to-face and non-face-to-face activities for this patient on the day of the visit. Professional time spent includes the following activities: Preparing to see the patient (review of tests), Obtaining and/or reviewing separately obtained history (admission/discharge record), Performing a medically appropriate examination and/or evaluation , Ordering medications/tests/procedures, referring and communicating with other health care professionals, Documenting clinical information in the EMR, Independently interpreting results (not separately reported), Communicating results to the patient/family/caregiver, Counseling and educating the patient/family/caregiver and Care coordination (not separately reported).

## 2022-11-18 ENCOUNTER — Other Ambulatory Visit (INDEPENDENT_AMBULATORY_CARE_PROVIDER_SITE_OTHER): Payer: Self-pay | Admitting: Nurse Practitioner

## 2022-11-19 ENCOUNTER — Ambulatory Visit: Payer: Medicare Other | Admitting: Internal Medicine

## 2022-11-25 ENCOUNTER — Telehealth: Payer: Self-pay

## 2022-11-25 ENCOUNTER — Ambulatory Visit: Payer: Medicare Other | Admitting: Internal Medicine

## 2022-11-25 NOTE — Telephone Encounter (Signed)
Please advise if you want to see patient sooner or wait till June

## 2022-11-25 NOTE — Telephone Encounter (Signed)
Patient spouse [name] is calling because patient is having dysphagia. He wants to make a appointment on a Monday or Thursday. Offered him appointment for 11/26/2022 at 2:45pm and he states they can not do that because they have to meet with her surgeon. Informed [name] that I would sent a message to Dr. Vicente Males team and see if there is anything they can do or work her in sooner. Informed him next available would be June and our schedule is not out for June yet.

## 2022-11-26 NOTE — Telephone Encounter (Signed)
Called and talk to patient husband schedule her a appointment for 12/26/2022 at 1:15pm

## 2022-11-26 NOTE — Telephone Encounter (Signed)
April 25th I have availability on Thursday

## 2022-11-28 ENCOUNTER — Telehealth: Payer: Self-pay | Admitting: Pharmacist

## 2022-11-28 NOTE — Telephone Encounter (Signed)
Sounds good. Thanks, Dr. Gale Journey!

## 2022-11-28 NOTE — Telephone Encounter (Signed)
Called [name] to see if they needed more omadacycline samples. I gave them ~40 days worth on 10/21/22. According to my math, she should need more next week. [name] states that she has "plenty" and wont need more for awhile. Again, I am worried she is not taking it like she should.  [name] did say that they saw Dr. Doreatha Martin on Tuesday and he sent Cassandra [name] to Dr. Sharol Given to discuss getting a BKA. She has an appointment with Dr. Sharol Given on 4/4. I told him I would call and follow up with him after that appointment.   Ivon Oelkers L. Kameren Baade, PharmD, BCIDP, AAHIVP, CPP Clinical Pharmacist Practitioner Infectious Diseases [place] [facility] for [facility] 11/28/2022, 10:48 AM

## 2022-12-05 ENCOUNTER — Ambulatory Visit (INDEPENDENT_AMBULATORY_CARE_PROVIDER_SITE_OTHER): Payer: Medicare Other | Admitting: Orthopedic Surgery

## 2022-12-05 DIAGNOSIS — M86262 Subacute osteomyelitis, left tibia and fibula: Secondary | ICD-10-CM

## 2022-12-06 ENCOUNTER — Encounter: Payer: Self-pay | Admitting: Orthopedic Surgery
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
## 2022-12-12 ENCOUNTER — Other Ambulatory Visit: Payer: Self-pay

## 2022-12-12 ENCOUNTER — Ambulatory Visit (INDEPENDENT_AMBULATORY_CARE_PROVIDER_SITE_OTHER): Payer: Medicare Other | Admitting: Internal Medicine

## 2022-12-12 ENCOUNTER — Encounter: Payer: Self-pay | Admitting: Internal Medicine

## 2022-12-12 VITALS — BP 139/81 | Temp 97.9°F

## 2022-12-12 DIAGNOSIS — M869 Osteomyelitis, unspecified: Secondary | ICD-10-CM

## 2022-12-12 NOTE — Patient Instructions (Signed)
You can stop the 2 other antibiotics: Nuzyra and clofazimine up to 2 weeks before the surgery date    Please see me 2-3 months after surgery   Will check kidney and blood today
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
## 2022-12-13 LAB — COMPLETE METABOLIC PANEL WITH GFR
AG Ratio: 1.1 (calc) (ref 1.0–2.5)
ALT: 39 U/L — ABNORMAL HIGH (ref 6–29)
AST: 53 U/L — ABNORMAL HIGH (ref 10–35)
Albumin: 3.6 g/dL (ref 3.6–5.1)
Alkaline phosphatase (APISO): 98 U/L (ref 37–153)
BUN/Creatinine Ratio: 13 (calc) (ref 6–22)
BUN: 21 mg/dL (ref 7–25)
CO2: 23 mmol/L (ref 20–32)
Calcium: 8.7 mg/dL (ref 8.6–10.4)
Chloride: 103 mmol/L (ref 98–110)
Creat: 1.68 mg/dL — ABNORMAL HIGH (ref 0.60–0.95)
Globulin: 3.4 g/dL (calc) (ref 1.9–3.7)
Glucose, Bld: 82 mg/dL (ref 65–99)
Potassium: 4.8 mmol/L (ref 3.5–5.3)
Sodium: 139 mmol/L (ref 135–146)
Total Bilirubin: 0.3 mg/dL (ref 0.2–1.2)
Total Protein: 7 g/dL (ref 6.1–8.1)
eGFR: 29 mL/min/{1.73_m2} — ABNORMAL LOW (ref >=60)

## 2022-12-13 LAB — CBC
HCT: 34.1 % — ABNORMAL LOW (ref 35.0–45.0)
Hemoglobin: 10.8 g/dL — ABNORMAL LOW (ref 11.7–15.5)
MCH: 27.8 pg (ref 27.0–33.0)
MCHC: 31.7 g/dL — ABNORMAL LOW (ref 32.0–36.0)
MCV: 87.9 fL (ref 80.0–100.0)
MPV: 9.9 fL (ref 7.5–12.5)
Platelets: 296 10*3/uL (ref 140–400)
RBC: 3.88 10*6/uL (ref 3.80–5.10)
RDW: 18.6 % — ABNORMAL HIGH (ref 11.0–15.0)
WBC: 9.4 10*3/uL (ref 3.8–10.8)

## 2022-12-24 ENCOUNTER — Ambulatory Visit: Payer: Self-pay | Admitting: Student

## 2022-12-26 ENCOUNTER — Encounter: Payer: Self-pay | Admitting: Gastroenterology

## 2022-12-26 ENCOUNTER — Ambulatory Visit (INDEPENDENT_AMBULATORY_CARE_PROVIDER_SITE_OTHER): Payer: Medicare Other | Admitting: Gastroenterology

## 2022-12-26 VITALS — BP 85/52 | HR 79 | Temp 98.5°F

## 2022-12-26 DIAGNOSIS — R1319 Other dysphagia: Secondary | ICD-10-CM | POA: Diagnosis not present

## 2022-12-26 NOTE — Progress Notes (Signed)
Wyline Mood MD, MRCP(U.K) [address]  [address]  [place], [place] [address]  Main: [phone]  Fax: [phone]   Primary Care Physician: [name], MD  Primary Gastroenterologist:  Dr. Wyline Mood   Chief Complaint  Patient presents with   Dysphagia    HPI: TENNESSEE HANLON is a 89 y.o. female   Summary of history :  Referred and seen in October 2023 for dysphagia.  She states that she has difficulty swallowing small pills to get lost when she tries to swallow but takes it with applesauce denies any coughing denies any actual pills that get lodged into her esophagus when she swallows.  She has to be on oxygen but is still waiting for her oxygen.Previously seen and evaluated by [facility] clinic gastroenterology in March 20 21-20 23 for the same.  Issues with dysphagia ongoing since 2019.  Recently seen at [facility] GI office in March 2023 March 2021 barium showed mild prominence of cricopharyngeus fold.  Plan at that time was for barium swallow.   11/14/2021 barium swallow showed study positive for aspiration nonspecific esophageal dysmotility noted small hiatal hernia patient refused barium tablet, was recommended modified barium swallow.     Interval history 06/27/2022-12/26/2022  08/16/2022 modified barium swallow shows laryngeal penetration without frank aspiration, During the evaluation it was noted that when she consumes drinks via straw that led her to cough and she was advised to consume fluids without a straw and no further intervention is required at this point of time.  I have reviewed the results with the patient and she was not aware that she should drink fluids without a straw she states that she invariably has every drink using a straw.   Current Outpatient Medications  Medication Sig Dispense Refill   acetaminophen (TYLENOL) 500 MG tablet Take 500-1,000 mg by mouth every 6 (six) hours as needed for moderate pain.     albuterol (VENTOLIN HFA) 108 (90  Base) MCG/ACT inhaler Inhale 2 puffs into the lungs every 6 (six) hours as needed for wheezing or shortness of breath. 8 g 0   amitriptyline (ELAVIL) 10 MG tablet Take 10 mg by mouth at bedtime.     cholecalciferol (VITAMIN D3) 25 MCG (1000 UNIT) tablet Take 1,000 Units by mouth daily.     CLOFAZIMINE PO Take 100 mg by mouth every morning.     desvenlafaxine (PRISTIQ) 50 MG 24 hr tablet Take 50 mg by mouth every evening.     ELIQUIS 5 MG TABS tablet Take 5 mg by mouth 2 (two) times daily.     fluconazole (DIFLUCAN) 100 MG tablet Take by mouth.     hydrOXYzine (ATARAX) 10 MG tablet Take 1 tablet (10 mg total) by mouth 3 (three) times daily as needed. 30 tablet 0   insulin aspart (NOVOLOG FLEXPEN) 100 UNIT/ML FlexPen Hold until followup with outpatient provider. 15 mL 11   insulin glargine (LANTUS SOLOSTAR) 100 UNIT/ML Solostar Pen Hold until followup with outpatient provider. 15 mL 11   levalbuterol (XOPENEX HFA) 45 MCG/ACT inhaler Inhale 2 puffs into the lungs every 4 (four) hours as needed for wheezing.     levothyroxine (SYNTHROID) 100 MCG tablet Take 100 mcg by mouth every morning.     linezolid (ZYVOX) 600 MG tablet Take 1 tablet (600 mg total) by mouth daily. 30 tablet 5   metFORMIN (GLUCOPHAGE) 500 MG tablet Take 500 mg by mouth 2 (two) times daily with a meal.     metoCLOPramide (  REGLAN) 5 MG tablet Take 5 mg by mouth 2 (two) times daily before a meal.     metolazone (ZAROXOLYN) 2.5 MG tablet Take 2.5 mg by mouth daily.     metoprolol tartrate (LOPRESSOR) 25 MG tablet Take 1 tablet (25 mg total) by mouth 2 (two) times daily. 60 tablet 2   Multiple Vitamin (MULTIVITAMIN WITH MINERALS) TABS tablet Take 1 tablet by mouth daily. 30 tablet 0   nitroGLYCERIN (NITROSTAT) 0.4 MG SL tablet Place 1 tablet (0.4 mg total) under the tongue every 5 (five) minutes as needed for chest pain. 90 tablet 0   [name] Tosylate 150 MG TABS Take 2 tablets (300 mg total) by mouth daily. 56 tablet 2    potassium chloride (KLOR-CON M) 10 MEQ tablet Take 10 mEq by mouth 2 (two) times daily.     predniSONE (DELTASONE) 10 MG tablet Taper 6-6-4-4-2-2-1-1-off     rosuvastatin (CRESTOR) 10 MG tablet Take 10 mg by mouth at bedtime.     telmisartan (MICARDIS) 40 MG tablet Take 40 mg by mouth daily.     torsemide (DEMADEX) 20 MG tablet Take 20 mg by mouth daily.     traZODone (DESYREL) 50 MG tablet Take 50 mg by mouth at bedtime.     No current facility-administered medications for this visit.    Allergies as of 12/26/2022 - Review Complete 12/26/2022  Allergen Reaction Noted   Baclofen Other (See Comments) 06/07/2022   Levofloxacin Other (See Comments) 07/10/2021        ROS:  General: Negative for anorexia, weight loss, fever, chills, fatigue, weakness. ENT: Negative for hoarseness, difficulty swallowing , nasal congestion. CV: Negative for chest pain, angina, palpitations, dyspnea on exertion, peripheral edema.  Respiratory: Negative for dyspnea at rest, dyspnea on exertion, cough, sputum, wheezing.  GI: See history of present illness. GU:  Negative for dysuria, hematuria, urinary incontinence, urinary frequency, nocturnal urination.  Endo: Negative for unusual weight change.    Physical Examination:   BP (!) 85/52   Pulse 79   Temp 98.5 F (36.9 C) (Oral)   LMP  (LMP Unknown) Comment: age 25  General: Well-nourished, well-developed in no acute distress.  Eyes: No icterus. Conjunctivae pink. Mouth: Oropharyngeal mucosa moist and pink , no lesions erythema or exudate. Skin: Warm and dry, no jaundice.   Psych: Alert and cooperative, normal mood and affect.   Imaging Studies: No results found.  Assessment and Plan:   JUSTYNE ROELL is a 89 y.o. y/o female here to follow-up for dysphagia.  Barium swallow showed features of aspiration which stimulated cough response.  A modified barium swallow showed that she was aspirating when she drank fluids utilizing a straw which she  uses most of the time.  We reviewed the results with her and I suggested that she stop using a straw for sipping on her fluids and if the issue persisted then we would need to proceed with an upper endoscopy.  I have tried to avoid an upper endoscopy with anesthesia in view of the risks associated due to her comorbidities and her age but if we have no other option then we will need to proceed with that.  She will try drinking fluids without using a straw and contact my office if she has no relief from her swallowing problems    Dr Wyline Mood  MD,MRCP [facility]) Follow up in as needed

## 2022-12-27 ENCOUNTER — Inpatient Hospital Stay (HOSPITAL_COMMUNITY): Admission: RE | Admit: 2022-12-27 | Payer: Medicare Other | Source: Home / Self Care | Admitting: Orthopedic Surgery

## 2022-12-27 ENCOUNTER — Encounter (HOSPITAL_COMMUNITY): Admission: RE | Payer: Self-pay | Source: Home / Self Care

## 2022-12-27 SURGERY — AMPUTATION BELOW KNEE
Anesthesia: Choice | Site: Knee | Laterality: Left

## 2022-12-30 NOTE — H&P (Signed)
Orthopaedic Trauma Service (OTS) H&P  Patient ID: Cassandra [name] MRN: [id number] DOB/AGE: [birth year] 89 y.o.  Reason for Surgery: Chronic left ankle wound/osteomyelitis  HPI: Cassandra [name] is an 89 y.o. female with PMH significant for DM (last HgbA1C 6.2% on 09/13/22) presenting for surgery on left lower extremity. Patient is status post an open left tibia and fibular fracture which she underwent open reduction internal fixation of the left ankle in June 2022. Dr. Jena Gauss attempted ankle fusion in February 2023 but due to gross infection upon making a surgical incision, ankle fusion was not completed. Patient underwent irrigation and debridement of the left ankle in April 2023 and again in September 2023. Patient has been on multiple rounds of IV and oral antibiotics, guided by the infectious disease team. She continues to have significant pain and increased swelling of the left leg. She presents now for transtibial amputation of the left leg.   Patient on Metformin and insulin for her DM. Takes both Eliquis and Plavix daily. Last dose of Eliquis was Monday, 12/30/22.  Past Medical History:  Diagnosis Date   Acquired hypothyroidism 01/06/2014   Anxiety    Arthritis    B12 deficiency 02/15/2014   Benign essential hypertension 01/06/2014   Breast cancer (HCC) 1982   Right breast cancer - chemotherapy   CAD (coronary artery disease), native coronary artery 01/06/2014   Carotid artery calcification    Chronic airway obstruction, not elsewhere classified 01/06/2014   Chronic mesenteric ischemia (HCC)    s/p SMA stent 11/03/19   Depression    Diabetes mellitus without complication (HCC)    Dysrhythmia    aflutter with RVR 06/2016   History of kidney stones 2019   left ureteral stone   Incontinence of urine    Myocardial infarction (HCC) 1982   small infarct   Neuromuscular disorder (HCC)    restless legs   Pacemaker    Presence of permanent cardiac pacemaker 07/2016   Pure  hypercholesterolemia 01/06/2014   Renal insufficiency    Skin cancer    Sleep apnea    no longer uses a cpap. Uses oxygen as needed    Past Surgical History:  Procedure Laterality Date   ANKLE FUSION Left 10/10/2021   Procedure: ANKLE FUSION;  Surgeon: Roby Lofts, MD;  Location: [facility] OR;  Service: Orthopedics;  Laterality: Left;   APPENDECTOMY     APPLICATION OF WOUND VAC Left 02/24/2021   Procedure: APPLICATION OF WOUND VAC;  Surgeon: Samson Frederic, MD;  Location: [facility] OR;  Service: Orthopedics;  Laterality: Left;   AUGMENTATION MAMMAPLASTY Bilateral 1982/redo in 2014   prior mastectomy   CARDIAC CATHETERIZATION N/A 04/23/2016   Procedure: Left Heart Cath and Coronary Angiography;  Surgeon: Lamar Blinks, MD;  Location: [facility] INVASIVE CV LAB;  Service: Cardiovascular;  Laterality: N/A;   CARDIAC CATHETERIZATION Left 04/23/2016   Procedure: Coronary Stent Intervention;  Surgeon: Alwyn Pea, MD;  Location: [facility] INVASIVE CV LAB;  Service: Cardiovascular;  Laterality: Left;   CAROTID ARTERY ANGIOPLASTY Left 2010   had stent inserted and removed d/t infection. artery from leg inserted in left carotid   CAROTID ENDARTERECTOMY Left 2010   left CEA ~ 2010, s/p excision infected left carotid patch & pseudoaneurysm with left CCA-ICA bypass using SVG 05/20/12   CHOLECYSTECTOMY     CORONARY ANGIOPLASTY WITH STENT PLACEMENT  september 9th 2017   CYSTOSCOPY W/ URETERAL STENT PLACEMENT Left 08/17/2017   Procedure: CYSTOSCOPY WITH RETROGRADE PYELOGRAM/URETERAL STENT PLACEMENT;  Surgeon: Mena Goes,  Molli Hazard, MD;  Location: [facility] ORS;  Service: Urology;  Laterality: Left;   CYSTOSCOPY W/ URETERAL STENT PLACEMENT Left 09/16/2017   Procedure: CYSTOSCOPY WITH STENT REPLACEMENT;  Surgeon: [name], MD;  Location: [facility] ORS;  Service: Urology;  Laterality: Left;   CYSTOSCOPY/RETROGRADE/URETEROSCOPY/STONE EXTRACTION WITH BASKET Left 09/16/2017   Procedure:  CYSTOSCOPY/RETROGRADE/URETEROSCOPY/STONE EXTRACTION WITH BASKET;  Surgeon: [name], MD;  Location: [facility] ORS;  Service: Urology;  Laterality: Left;   EXTERNAL FIXATION LEG Left 02/24/2021   Procedure: EXTERNAL FIXATION  ANKLE;  Surgeon: Samson Frederic, MD;  Location: [facility];  Service: Orthopedics;  Laterality: Left;   EXTERNAL FIXATION REMOVAL Left 02/26/2021   Procedure: REMOVAL EXTERNAL FIXATION LEG;  Surgeon: Roby Lofts, MD;  Location: [facility] OR;  Service: Orthopedics;  Laterality: Left;   EYE SURGERY Bilateral    cataract extraction   I & D EXTREMITY Left 02/24/2021   Procedure: IRRIGATION AND DEBRIDEMENT EXTREMITY;  Surgeon: Samson Frederic, MD;  Location: [facility] OR;  Service: Orthopedics;  Laterality: Left;   I & D EXTREMITY Left 12/21/2021   Procedure: IRRIGATION AND DEBRIDEMENT LEFT ANKLE;  Surgeon: Roby Lofts, MD;  Location: [facility];  Service: Orthopedics;  Laterality: Left;   I & D EXTREMITY Left 05/03/2022   Procedure: DEBRIDEMENT OF LEFT ANKLE INFECTION;  Surgeon: Roby Lofts, MD;  Location: [facility];  Service: Orthopedics;  Laterality: Left;   IR FLUORO GUIDE CV LINE LEFT  12/27/2021   IR FLUORO GUIDE CV LINE RIGHT  10/12/2021   IR REMOVAL TUN CV CATH W/O FL  11/22/2021   IR REMOVAL TUN CV CATH W/O FL  10/10/2022   IR US GUIDE VASC ACCESS LEFT  12/27/2021   IR US GUIDE VASC ACCESS RIGHT  10/12/2021   KYPHOPLASTY N/A 02/03/2018   Procedure: [id number];  Surgeon: Kennedy Bucker, MD;  Location: [facility] ORS;  Service: Orthopedics;  Laterality: N/A;   MASTECTOMY Right 1982   ORIF ANKLE FRACTURE Left 02/26/2021   Procedure: OPEN REDUCTION INTERNAL FIXATION (ORIF) ANKLE FRACTURE;  Surgeon: Roby Lofts, MD;  Location: [facility] OR;  Service: Orthopedics;  Laterality: Left;   PACEMAKER INSERTION Left 07/03/2016   Procedure: INSERTION PACEMAKER;  Surgeon: Marcina Millard, MD;  Location: [facility] ORS;  Service: Cardiovascular;  Laterality: Left;   SKIN CANCER EXCISION     TONSILLECTOMY      TUBAL LIGATION     VISCERAL ANGIOGRAPHY N/A 11/03/2019   Procedure: VISCERAL ANGIOGRAPHY;  Surgeon: Renford Dills, MD;  Location: [facility] INVASIVE CV LAB;  Service: Cardiovascular;  Laterality: N/A;    Family History  Problem Relation Age of Onset   Prostate cancer Neg Hx    Kidney cancer Neg Hx    Breast cancer Neg Hx     Social History:  reports that she quit smoking about 68 years ago. Her smoking use included cigarettes. She started smoking about 69 years ago. She smoked an average of .5 packs per day. She has been exposed to tobacco smoke. She has never used smokeless tobacco. She reports current alcohol use. She reports that she does not currently use drugs.  Allergies:  Allergies  Allergen Reactions   Baclofen Other (See Comments)    Hallucinations   Levofloxacin Other (See Comments)    Hallucinations     Medications: I have reviewed the patient's current medications. Prior to Admission:  No medications prior to admission.    ROS: Constitutional: No fever or chills Vision: No changes in vision ENT: No difficulty swallowing CV: No  chest pain Pulm: No SOB or wheezing GI: No nausea or vomiting GU: No urgency or inability to hold urine Skin: No wound or skin lesions Neurologic: No numbness or tingling Psychiatric: No depression or anxiety Heme: No bruising Allergic: No reaction to medications or food   Exam: Height 4\' 11"  (1.499 m), weight 58.1 kg. General:NAD Orientation:Alert and oriented x3 Mood and Affect: Mood and affect appropriate. Pleasant and cooperative Gait: Limited mobility LLE Coordination and balance: WNL  LLE: Global swelling about the ankle and foot. Multiple healed incisions. Limited ankle ROM.   RLE: Skin without lesions. No tenderness to palpation. Full painless ROM, full strength in each muscle groups without evidence of instability.   Medical Decision Making: Data: Imaging: CT scan left tibia from 09/20/22 shows antibiotic cement  spacer at the talar dome, distal tibial metaphysis and medial malleolus with persistent sclerosis in the distal tibia and talar dome. Stable osteolysis of the distal tibia and talar dome with no new areas of osteolysis or erosion. No fluid collection noted  Labs: No results found for this or any previous visit (from the past 168 hour(s)).   Assessment/Plan: 89 year old female with subacute osteomyelitis left tibia  Patient has undergone multiple I&Ds of the left lower extremity in addition to several rounds of IV and PO antibiotics guided by infectious disease without full resolution of the infection or the associated symptoms. She continues to have significant pain and swelling in the left leg. Patient has been evaluated by Dr. Lajoyce Corners for a second opinion, he agrees that transtibial amputation is the most appropriate treatment at this time. Risks and benefits of the procedure have been discussed with the patient and her husband. Risks discussed included bleeding, continued infection, damage to surrounding nerves, continued pain, need for additional surgery, wound healing problems, and anesthesia complications. Patient states her understanding of these risks ands agrees to proceed with surgery.   Thompson Caul PA-C [facility] [phone] (office) [email or web address]

## 2022-12-31 ENCOUNTER — Other Ambulatory Visit: Payer: Self-pay

## 2022-12-31 ENCOUNTER — Encounter (HOSPITAL_COMMUNITY): Payer: Self-pay | Admitting: Student

## 2022-12-31 NOTE — Anesthesia Preprocedure Evaluation (Addendum)
Anesthesia Evaluation  Patient identified by MRN, date of birth, ID band Patient awake    Reviewed: Allergy & Precautions, NPO status , Patient's Chart, lab work & pertinent test results, reviewed documented beta blocker date and time   Airway Mallampati: II  TM Distance: >3 FB Neck ROM: Full    Dental  (+) Edentulous Upper, Edentulous Lower, Dental Advisory Given   Pulmonary sleep apnea and Oxygen sleep apnea , COPD,  oxygen dependent, former smoker 2L O2 at night   Pulmonary exam normal breath sounds clear to auscultation       Cardiovascular hypertension, Pt. on home beta blockers and Pt. on medications + CAD, + Past MI, + Cardiac Stents, + Peripheral Vascular Disease and +CHF  Normal cardiovascular exam+ dysrhythmias Atrial Fibrillation + pacemaker + Valvular Problems/Murmurs (mild/mod AI, mild/mod MR) AI and MR  Rhythm:Regular Rate:Normal  TTE 2023  1. Left ventricular ejection fraction, by estimation, is 45 to 50%. The  left ventricle has mildly decreased function. The left ventricle  demonstrates global hypokinesis. The left ventricular internal cavity size  was mildly dilated. There is moderate  left ventricular hypertrophy. Left ventricular diastolic parameters are  consistent with Grade II diastolic dysfunction (pseudonormalization).   2. Right ventricular systolic function is mildly reduced. The right  ventricular size is moderately enlarged.   3. Left atrial size was moderately dilated.   4. Right atrial size was moderately dilated.   5. The mitral valve is normal in structure. Mild to moderate mitral valve  regurgitation. No evidence of mitral stenosis.   6. Tricuspid valve regurgitation is moderate to severe.   7. The aortic valve is calcified. Aortic valve regurgitation is mild to  moderate. Aortic valve sclerosis/calcification is present, without any  evidence of aortic stenosis.   8. The inferior vena cava is  normal in size with greater than 50%  respiratory variability, suggesting right atrial pressure of 3 mmHg.     Neuro/Psych  Headaches PSYCHIATRIC DISORDERS Anxiety Depression    CVA    GI/Hepatic negative GI ROS, Neg liver ROS,,,  Endo/Other  diabetes, Type 2, Insulin Dependent, Oral Hypoglycemic AgentsHypothyroidism    Renal/GU Renal InsufficiencyRenal disease  negative genitourinary   Musculoskeletal  (+) Arthritis ,    Abdominal   Peds  Hematology  (+) Blood dyscrasia (plavix, eliquis)   Anesthesia Other Findings   Reproductive/Obstetrics                             Anesthesia Physical Anesthesia Plan  ASA: 3  Anesthesia Plan: General and Regional   Post-op Pain Management: Regional block* and Tylenol PO (pre-op)*   Induction: Intravenous  PONV Risk Score and Plan: 3 and Ondansetron, Dexamethasone and Treatment may vary due to age or medical condition  Airway Management Planned: LMA  Additional Equipment:   Intra-op Plan:   Post-operative Plan: Extubation in OR  Informed Consent: I have reviewed the patients History and Physical, chart, labs and discussed the procedure including the risks, benefits and alternatives for the proposed anesthesia with the patient or authorized representative who has indicated his/her understanding and acceptance.     Dental advisory given  Plan Discussed with: CRNA  Anesthesia Plan Comments: (PAT note by Antionette Poles, PA-C: Patient follows cardiology at [facility] clinic for history of CAD s/p PCI RCA 2017, sick sinus syndrome s/p DC PPM, HTN, HLD, CVA.  She was last seen by cardiology 08/21/2022 during admission for acute CVA.  There was question of paroxysmal A-fib as cause of CVA (history of A-fib noted on pacemaker interrogation in 2022, however, none recently).  Given high CHA2DS2-VASc score, she was recommended to take dose reduced Eliquis 2.5 mg twice daily alongside Plavix 75 mg  daily.  Perioperative EP device form has been faxed to her providers at [facility] clinic.  Admitted 12/18 through 08/21/2022 for acute stroke.  She was also noted to have hypertensive urgency with BP 190/114.  CVA was diagnosed clinically by neurology, unable to perform MRI due to pacemaker.  TTE showed EF 45 to 50%, no intracardiac clot, grade 2 DD, mild to moderate mitral valve regurgitation, mild to moderate aortic valve regurgitation, moderate to severe tricuspid valve regurgitation, evere pulmonary hypertension. Head CT at 24 hrs stable with no acute intracranial process.Pacemaker was interrogatedand showedshe had multiple episodes (up to multiple hours) of AF in 2022, but none recently. She is now maintained on Plavix and Eliquis.  Patient is status post an open left tibia and fibular fracture which she underwent open reduction internal fixation of the left ankle in June 2022. Dr. Jena Gauss attempted ankle fusion in February 2023 but due to gross infection upon making a surgical incision, ankle fusion was not completed. Patient underwent irrigation and debridement of the left ankle in April 2023 and again in September 2023. Patient has been on multiple rounds of IV and oral antibiotics, guided by the infectious disease team. She continues to have significant pain and increased swelling of the left leg. She presents now for transtibial amputation of the left leg.   IDDM 2, last A1c 6.2 on 09/13/2022.  Breast cancer s/p right mastectomy.  History of COPD/chronic hypoxic respiratory failure on 2 L SUPPLEMENTAL O2.  CKD 4, review of labs shows baseline creatinine ~1.6-2.0.  CMP and CBC from 12/12/2022 reviewed, creatinine 1.68 consistent with history of CKD, mild anemia hemoglobin 10.8, otherwise unremarkable.  Per note from Dionicio Stall, RN, patient reported she took both her Eliquis and Plavix on 12/30/2022 and is waiting to hear from Dr. Jena Gauss if surgery will proceed.  Patient will need  day of surgery evaluation.  EKG 08/19/2022: Sinus rhythm.  Rate 70.  Right bundle branch block and left anterior fascicular block.  TTE 08/20/2022: 1. Left ventricular ejection fraction, by estimation, is 45 to 50%. The  left ventricle has mildly decreased function. The left ventricle  demonstrates global hypokinesis. The left ventricular internal cavity size  was mildly dilated. There is moderate  left ventricular hypertrophy. Left ventricular diastolic parameters are  consistent with Grade II diastolic dysfunction (pseudonormalization).  2. Right ventricular systolic function is mildly reduced. The right  ventricular size is moderately enlarged.  3. Left atrial size was moderately dilated.  4. Right atrial size was moderately dilated.  5. The mitral valve is normal in structure. Mild to moderate mitral valve  regurgitation. No evidence of mitral stenosis.  6. Tricuspid valve regurgitation is moderate to severe.  7. The aortic valve is calcified. Aortic valve regurgitation is mild to  moderate. Aortic valve sclerosis/calcification is present, without any  evidence of aortic stenosis.  8. The inferior vena cava is normal in size with greater than 50%  respiratory variability, suggesting right atrial pressure of 3 mmHg.   Conclusion(s)/Recommendation(s): Severe pulmonary HTN.   Nuclear stress 10/24/2014 (Care Everywhere): Negative Lexiscan stress with no evidence of ischemia with  normal LV function    )        Anesthesia Quick Evaluation

## 2022-12-31 NOTE — Progress Notes (Signed)
SDW call  Spoke with [name], patients husband and was given pre-op instructions over the phone. [name] verbalized understanding of instructions provided.  [name] states they have an appointment today with Dr. Jena Gauss to be evaluated if surgery will proceed.  Patient has continued to take her Plavix and Eliquis with last dose of 12/30/2022 for both.  States they were not told to stop either one.    PCP - Dr. Aram Beecham Cardiologist - Dr Harold Hedge Endocrinologist: Dr. Verdis Frederickson Infectious Diseases: Dr Rutha Bouchard   PPM/ICD - Yes, Med[employer]onic Pacemaker inserted 07/03/2016.  Last interrogation 2023 at [facility].  Device Orders - Faxed to device clinic, awaiting orders Rep Notified - Tobie Lords notified   Chest x-ray - 08/09/2022 EKG -  08/19/2022 Stress Test - ECHO - 08/20/2022 Cardiac Cath - 04/23/2016  Sleep Study/sleep apnea/CPAP: Diagnosed with sleep apnea and wears a CPAP nightly  Type II diabetic Fasting Blood sugar range: 90-100 How often check sugars. Continuous with Freestyle Libre 2 Metformin; to hold day of surgery Insulin aspart (Novolog flex pen); take 4 units (100%) morning before surgery and none the day of surgery Insulin glargine (Lantus solostar); take 3 units (50% of regular dose) the evening before surgery.    Blood Thinner Instructions: Patient is on both plavix and eliquis and has continued them. States was not told to stop them.  Aspirin Instructions: Denies   ERAS Protcol - Yes, clear liquids until 0530 PRE-SURGERY Ensure or G2-    COVID TEST- n/a    Anesthesia review: Yes.  CAD, HTN, MI, Pacemaker, A-flutter, COPD with CPAP, DM, renal insufficiency   Patient denies shortness of breath, fever, cough and chest pain over the phone call   Your procedure is scheduled on Wednesday Jan 01, 2023  Report to [facility] Main Entrance "A" at 0600   A.M., then check in with the Admitting office.  Call this number if you have problems the morning of  surgery:  [phone]   If you have any questions prior to your surgery date call [phone]: Open Monday-Friday 8am-4pm If you experience any cold or flu symptoms such as cough, fever, chills, shortness of breath, etc. between now and your scheduled surgery, please notify us at the above number     Remember:  Do not eat after midnight the night before your surgery  You may drink clear liquids until  0530  the morning of your surgery.   Clear liquids allowed are: Water, Non-Citrus Juices (without pulp), Carbonated Beverages, Clear Tea, Black Coffee ONLY (NO MILK, CREAM OR POWDERED CREAMER of any kind), and Gatorade   Take these medicines the morning of surgery with A SIP OF WATER:  Amitriptyline, pristiq, levothyroxine, metoprolol, crestor  As of today, STOP taking any Naproxen, Ibuprofen, Motrin, Advil, Goody's, BC's, all herbal medications, fish oil, and all vitamins.

## 2022-12-31 NOTE — Progress Notes (Signed)
Anesthesia Chart Review: Same day workup  Patient follows cardiology at [facility] clinic for history of CAD s/p PCI RCA 2017, sick sinus syndrome s/p DC PPM, HTN, HLD, CVA.  She was last seen by cardiology 08/21/2022 during admission for acute CVA.  There was question of paroxysmal A-fib as cause of CVA (history of A-fib noted on pacemaker interrogation in 2022, however, none recently).  Given high CHA2DS2-VASc score, she was recommended to take dose reduced Eliquis 2.5 mg twice daily alongside Plavix 75 mg daily.  Perioperative EP device form has been faxed to her providers at [facility] clinic.  Admitted 12/18 through 08/21/2022 for acute stroke.  She was also noted to have hypertensive urgency with BP 190/114.  CVA was diagnosed clinically by neurology, unable to perform MRI due to pacemaker.  TTE showed EF 45 to 50%, no intracardiac clot, grade 2 DD, mild to moderate mitral valve regurgitation, mild to moderate aortic valve regurgitation, moderate to severe tricuspid valve regurgitation, evere pulmonary hypertension. Head CT at 24 hrs stable with no acute intracranial process.  Pacemaker was interrogated and showed she had multiple episodes (up to multiple hours) of AF in 2022, but none recently.  She is now maintained on Plavix and Eliquis.  Patient is status post an open left tibia and fibular fracture which she underwent open reduction internal fixation of the left ankle in June 2022. Dr. Jena Gauss attempted ankle fusion in February 2023 but due to gross infection upon making a surgical incision, ankle fusion was not completed. Patient underwent irrigation and debridement of the left ankle in April 2023 and again in September 2023. Patient has been on multiple rounds of IV and oral antibiotics, guided by the infectious disease team. She continues to have significant pain and increased swelling of the left leg. She presents now for transtibial amputation of the left leg.   IDDM 2, last A1c 6.2 on  09/13/2022.  Breast cancer s/p right mastectomy.  History of COPD/chronic hypoxic respiratory failure on 2 L SUPPLEMENTAL O2.  CKD 4, review of labs shows baseline creatinine ~1.6-2.0.  CMP and CBC from 12/12/2022 reviewed, creatinine 1.68 consistent with history of CKD, mild anemia hemoglobin 10.8, otherwise unremarkable.  Per note from Dionicio Stall, RN, patient reported she took both her Eliquis and Plavix on 12/30/2022 and is waiting to hear from Dr. Jena Gauss if surgery will proceed.  Patient will need day of surgery evaluation.  EKG 08/19/2022: Sinus rhythm.  Rate 70.  Right bundle branch block and left anterior fascicular block.  TTE 08/20/2022:  1. Left ventricular ejection fraction, by estimation, is 45 to 50%. The  left ventricle has mildly decreased function. The left ventricle  demonstrates global hypokinesis. The left ventricular internal cavity size  was mildly dilated. There is moderate  left ventricular hypertrophy. Left ventricular diastolic parameters are  consistent with Grade II diastolic dysfunction (pseudonormalization).   2. Right ventricular systolic function is mildly reduced. The right  ventricular size is moderately enlarged.   3. Left atrial size was moderately dilated.   4. Right atrial size was moderately dilated.   5. The mitral valve is normal in structure. Mild to moderate mitral valve  regurgitation. No evidence of mitral stenosis.   6. Tricuspid valve regurgitation is moderate to severe.   7. The aortic valve is calcified. Aortic valve regurgitation is mild to  moderate. Aortic valve sclerosis/calcification is present, without any  evidence of aortic stenosis.   8. The inferior vena cava is normal in size with greater  than 50%  respiratory variability, suggesting right atrial pressure of 3 mmHg.   Conclusion(s)/Recommendation(s): Severe pulmonary HTN.   Nuclear stress 10/24/2014 (Care Everywhere): Negative Lexiscan stress with no evidence of  ischemia with  normal LV function     Zannie Cove [facility] Short Stay Center/Anesthesiology Phone [phone] 12/31/2022 10:49 AM

## 2023-01-02 ENCOUNTER — Encounter (HOSPITAL_COMMUNITY): Payer: Self-pay | Admitting: Student

## 2023-01-02 NOTE — Progress Notes (Signed)
Spoke with pt and her husband [name] for pre-op call. Pt was originally scheduled for surgery on 01/01/23 but was moved to the 3rd, to be able to hold Plavix and Eliquis prior to surgery.  Last dose for both of these medications was 12/30/22. Pt told me that she is not taking any of her other medications because Dr. Jena Gauss told her to stop all her medications. Her husband said that Dr. Jena Gauss stopped all but 5 of them, but Mr. Held doesn't know which ones were to be taken. They were not at home during this phone call. I instructed Mrs. Taubman to take 1/2 of her regular dose of Lantus tonight, will take only 3 units. She was told in previous pre-op call to hold her Metformin DOS and Humalog insulin DOS.   Shower instructions given to pt and husband.

## 2023-01-03 ENCOUNTER — Inpatient Hospital Stay (HOSPITAL_COMMUNITY): Payer: Medicare Other | Admitting: Physician Assistant

## 2023-01-03 ENCOUNTER — Other Ambulatory Visit: Payer: Self-pay

## 2023-01-03 ENCOUNTER — Encounter (HOSPITAL_COMMUNITY): Payer: Self-pay | Admitting: Student

## 2023-01-03 ENCOUNTER — Inpatient Hospital Stay (HOSPITAL_COMMUNITY)
Admission: RE | Disposition: A | Discharge status: Rehabilitation Facility | Payer: Self-pay | Source: Home / Self Care | Attending: Student

## 2023-01-03 ENCOUNTER — Inpatient Hospital Stay (HOSPITAL_COMMUNITY)
Admission: RE | Admit: 2023-01-03 | Discharge: 2023-01-14 | DRG: 617 | Disposition: A | Discharge status: Rehabilitation Facility | Payer: Medicare Other | Attending: Student | Admitting: Student

## 2023-01-03 DIAGNOSIS — I6782 Cerebral ischemia: Secondary | ICD-10-CM | POA: Diagnosis not present

## 2023-01-03 DIAGNOSIS — Z950 Presence of cardiac pacemaker: Secondary | ICD-10-CM

## 2023-01-03 DIAGNOSIS — R1312 Dysphagia, oropharyngeal phase: Secondary | ICD-10-CM | POA: Diagnosis not present

## 2023-01-03 DIAGNOSIS — Z7984 Long term (current) use of oral hypoglycemic drugs: Secondary | ICD-10-CM | POA: Diagnosis not present

## 2023-01-03 DIAGNOSIS — Z79890 Hormone replacement therapy (postmenopausal): Secondary | ICD-10-CM

## 2023-01-03 DIAGNOSIS — E039 Hypothyroidism, unspecified: Secondary | ICD-10-CM | POA: Diagnosis present

## 2023-01-03 DIAGNOSIS — M86672 Other chronic osteomyelitis, left ankle and foot: Secondary | ICD-10-CM

## 2023-01-03 DIAGNOSIS — Z89512 Acquired absence of left leg below knee: Secondary | ICD-10-CM | POA: Diagnosis not present

## 2023-01-03 DIAGNOSIS — E7800 Pure hypercholesterolemia, unspecified: Secondary | ICD-10-CM | POA: Diagnosis present

## 2023-01-03 DIAGNOSIS — Z853 Personal history of malignant neoplasm of breast: Secondary | ICD-10-CM | POA: Diagnosis not present

## 2023-01-03 DIAGNOSIS — E1142 Type 2 diabetes mellitus with diabetic polyneuropathy: Secondary | ICD-10-CM | POA: Diagnosis present

## 2023-01-03 DIAGNOSIS — E871 Hypo-osmolality and hyponatremia: Secondary | ICD-10-CM | POA: Diagnosis not present

## 2023-01-03 DIAGNOSIS — Z794 Long term (current) use of insulin: Secondary | ICD-10-CM | POA: Diagnosis not present

## 2023-01-03 DIAGNOSIS — F0670 Mild neurocognitive disorder due to known physiological condition without behavioral disturbance: Secondary | ICD-10-CM | POA: Diagnosis not present

## 2023-01-03 DIAGNOSIS — D62 Acute posthemorrhagic anemia: Secondary | ICD-10-CM | POA: Diagnosis not present

## 2023-01-03 DIAGNOSIS — Z85828 Personal history of other malignant neoplasm of skin: Secondary | ICD-10-CM

## 2023-01-03 DIAGNOSIS — G546 Phantom limb syndrome with pain: Secondary | ICD-10-CM | POA: Diagnosis not present

## 2023-01-03 DIAGNOSIS — Z7902 Long term (current) use of antithrombotics/antiplatelets: Secondary | ICD-10-CM

## 2023-01-03 DIAGNOSIS — M86279 Subacute osteomyelitis, unspecified ankle and foot: Secondary | ICD-10-CM | POA: Diagnosis present

## 2023-01-03 DIAGNOSIS — I129 Hypertensive chronic kidney disease with stage 1 through stage 4 chronic kidney disease, or unspecified chronic kidney disease: Secondary | ICD-10-CM | POA: Diagnosis present

## 2023-01-03 DIAGNOSIS — J9611 Chronic respiratory failure with hypoxia: Secondary | ICD-10-CM | POA: Diagnosis present

## 2023-01-03 DIAGNOSIS — M86272 Subacute osteomyelitis, left ankle and foot: Secondary | ICD-10-CM | POA: Diagnosis not present

## 2023-01-03 DIAGNOSIS — E1122 Type 2 diabetes mellitus with diabetic chronic kidney disease: Secondary | ICD-10-CM | POA: Diagnosis present

## 2023-01-03 DIAGNOSIS — G8918 Other acute postprocedural pain: Secondary | ICD-10-CM | POA: Diagnosis not present

## 2023-01-03 DIAGNOSIS — J449 Chronic obstructive pulmonary disease, unspecified: Secondary | ICD-10-CM | POA: Diagnosis present

## 2023-01-03 DIAGNOSIS — Z4781 Encounter for orthopedic aftercare following surgical amputation: Secondary | ICD-10-CM | POA: Diagnosis not present

## 2023-01-03 DIAGNOSIS — Z87891 Personal history of nicotine dependence: Secondary | ICD-10-CM

## 2023-01-03 DIAGNOSIS — Z79899 Other long term (current) drug therapy: Secondary | ICD-10-CM

## 2023-01-03 DIAGNOSIS — N183 Chronic kidney disease, stage 3 unspecified: Secondary | ICD-10-CM | POA: Diagnosis present

## 2023-01-03 DIAGNOSIS — E1169 Type 2 diabetes mellitus with other specified complication: Secondary | ICD-10-CM | POA: Diagnosis present

## 2023-01-03 DIAGNOSIS — I495 Sick sinus syndrome: Secondary | ICD-10-CM | POA: Diagnosis present

## 2023-01-03 DIAGNOSIS — G2581 Restless legs syndrome: Secondary | ICD-10-CM | POA: Diagnosis present

## 2023-01-03 DIAGNOSIS — Z9221 Personal history of antineoplastic chemotherapy: Secondary | ICD-10-CM

## 2023-01-03 DIAGNOSIS — Z8673 Personal history of transient ischemic attack (TIA), and cerebral infarction without residual deficits: Secondary | ICD-10-CM

## 2023-01-03 DIAGNOSIS — Z955 Presence of coronary angioplasty implant and graft: Secondary | ICD-10-CM

## 2023-01-03 DIAGNOSIS — F419 Anxiety disorder, unspecified: Secondary | ICD-10-CM | POA: Diagnosis present

## 2023-01-03 DIAGNOSIS — I252 Old myocardial infarction: Secondary | ICD-10-CM

## 2023-01-03 DIAGNOSIS — Z9011 Acquired absence of right breast and nipple: Secondary | ICD-10-CM

## 2023-01-03 DIAGNOSIS — I2510 Atherosclerotic heart disease of native coronary artery without angina pectoris: Secondary | ICD-10-CM | POA: Diagnosis present

## 2023-01-03 DIAGNOSIS — G548 Other nerve root and plexus disorders: Secondary | ICD-10-CM | POA: Diagnosis not present

## 2023-01-03 DIAGNOSIS — Z888 Allergy status to other drugs, medicaments and biological substances status: Secondary | ICD-10-CM

## 2023-01-03 DIAGNOSIS — Z9981 Dependence on supplemental oxygen: Secondary | ICD-10-CM

## 2023-01-03 DIAGNOSIS — R52 Pain, unspecified: Secondary | ICD-10-CM | POA: Diagnosis not present

## 2023-01-03 DIAGNOSIS — Z7901 Long term (current) use of anticoagulants: Secondary | ICD-10-CM

## 2023-01-03 DIAGNOSIS — I10 Essential (primary) hypertension: Secondary | ICD-10-CM | POA: Diagnosis not present

## 2023-01-03 DIAGNOSIS — Z881 Allergy status to other antibiotic agents status: Secondary | ICD-10-CM

## 2023-01-03 HISTORY — PX: AMPUTATION: SHX166

## 2023-01-03 LAB — GLUCOSE, CAPILLARY
Glucose-Capillary: 114 mg/dL — ABNORMAL HIGH (ref 70–99)
Glucose-Capillary: 137 mg/dL — ABNORMAL HIGH (ref 70–99)
Glucose-Capillary: 266 mg/dL — ABNORMAL HIGH (ref 70–99)
Glucose-Capillary: 251 mg/dL — ABNORMAL HIGH (ref 70–99)

## 2023-01-03 SURGERY — AMPUTATION BELOW KNEE
Anesthesia: Regional | Site: Knee | Laterality: Left

## 2023-01-03 MED ORDER — FENTANYL CITRATE (PF) 250 MCG/5ML IJ SOLN
INTRAMUSCULAR | Status: AC
  Filled 2023-01-03: qty 5

## 2023-01-03 MED ORDER — DEXAMETHASONE SODIUM PHOSPHATE 10 MG/ML IJ SOLN
INTRAMUSCULAR | Status: AC
  Filled 2023-01-03: qty 1

## 2023-01-03 MED ORDER — PHENYLEPHRINE 80 MCG/ML (10ML) SYRINGE FOR IV PUSH (FOR BLOOD PRESSURE SUPPORT)
PREFILLED_SYRINGE | INTRAVENOUS | Status: AC
  Filled 2023-01-03: qty 10

## 2023-01-03 MED ORDER — INSULIN ASPART 100 UNIT/ML IJ SOLN: 0.0000 [IU] | INTRAMUSCULAR | Status: DC | PRN

## 2023-01-03 MED ORDER — LIDOCAINE 2% (20 MG/ML) 5 ML SYRINGE
INTRAMUSCULAR | Status: AC
  Filled 2023-01-03: qty 5

## 2023-01-03 MED ORDER — HYDROCODONE-ACETAMINOPHEN 7.5-325 MG PO TABS
1.0000 | ORAL_TABLET | ORAL | Status: DC | PRN
  Administered 2023-01-03 – 2023-01-05 (×3): 2 via ORAL
  Administered 2023-01-06: 1 via ORAL
  Administered 2023-01-07: 2 via ORAL
  Administered 2023-01-08: 1 via ORAL
  Administered 2023-01-09 (×2): 2 via ORAL
  Administered 2023-01-09: 1 via ORAL
  Administered 2023-01-10 – 2023-01-11 (×2): 2 via ORAL
  Filled 2023-01-03 (×5): qty 2
  Filled 2023-01-03: qty 1
  Filled 2023-01-03 (×4): qty 2
  Filled 2023-01-03: qty 1

## 2023-01-03 MED ORDER — SODIUM CHLORIDE 0.9 % IV SOLN: INTRAVENOUS | Status: DC

## 2023-01-03 MED ORDER — POLYETHYLENE GLYCOL 3350 17 G PO PACK
17.0000 g | PACK | Freq: Every day | ORAL | Status: DC | PRN
  Administered 2023-01-07: 17 g via ORAL
  Filled 2023-01-03: qty 1

## 2023-01-03 MED ORDER — 0.9 % SODIUM CHLORIDE (POUR BTL) OPTIME
TOPICAL | Status: DC | PRN
  Administered 2023-01-03: 1000 mL

## 2023-01-03 MED ORDER — LEVOTHYROXINE SODIUM 125 MCG PO TABS
125.0000 ug | ORAL_TABLET | Freq: Every day | ORAL | Status: DC
  Administered 2023-01-04 – 2023-01-14 (×10): 125 ug via ORAL
  Filled 2023-01-03 (×12): qty 1

## 2023-01-03 MED ORDER — DOCUSATE SODIUM 100 MG PO CAPS
100.0000 mg | ORAL_CAPSULE | Freq: Two times a day (BID) | ORAL | Status: DC
  Administered 2023-01-03 – 2023-01-14 (×21): 100 mg via ORAL
  Filled 2023-01-03 (×21): qty 1

## 2023-01-03 MED ORDER — PROPOFOL 10 MG/ML IV BOLUS
INTRAVENOUS | Status: DC | PRN
  Administered 2023-01-03: 80 mg via INTRAVENOUS

## 2023-01-03 MED ORDER — INSULIN ASPART 100 UNIT/ML FLEXPEN: 4.0000 [IU] | PEN_INJECTOR | Freq: Every day | SUBCUTANEOUS | Status: DC

## 2023-01-03 MED ORDER — ACETAMINOPHEN 325 MG PO TABS: 325.0000 mg | ORAL_TABLET | Freq: Four times a day (QID) | ORAL | Status: DC | PRN

## 2023-01-03 MED ORDER — CLOPIDOGREL BISULFATE 75 MG PO TABS: 75.0000 mg | ORAL_TABLET | Freq: Every day | ORAL | Status: DC

## 2023-01-03 MED ORDER — TORSEMIDE 20 MG PO TABS
20.0000 mg | ORAL_TABLET | Freq: Every day | ORAL | Status: DC
  Administered 2023-01-03 – 2023-01-14 (×12): 20 mg via ORAL
  Filled 2023-01-03 (×12): qty 1

## 2023-01-03 MED ORDER — PROPOFOL 10 MG/ML IV BOLUS
INTRAVENOUS | Status: AC
  Filled 2023-01-03: qty 20

## 2023-01-03 MED ORDER — FENTANYL CITRATE (PF) 100 MCG/2ML IJ SOLN
25.0000 ug | INTRAMUSCULAR | Status: DC | PRN
  Administered 2023-01-03: 50 ug via INTRAVENOUS

## 2023-01-03 MED ORDER — LABETALOL HCL 5 MG/ML IV SOLN
INTRAVENOUS | Status: AC
  Filled 2023-01-03: qty 4

## 2023-01-03 MED ORDER — FENTANYL CITRATE (PF) 100 MCG/2ML IJ SOLN
INTRAMUSCULAR | Status: AC
  Filled 2023-01-03: qty 2

## 2023-01-03 MED ORDER — ONDANSETRON HCL 4 MG/2ML IJ SOLN
INTRAMUSCULAR | Status: DC | PRN
  Administered 2023-01-03: 4 mg via INTRAVENOUS

## 2023-01-03 MED ORDER — VANCOMYCIN HCL 1000 MG IV SOLR
INTRAVENOUS | Status: AC
  Filled 2023-01-03: qty 20

## 2023-01-03 MED ORDER — INSULIN GLARGINE-YFGN 100 UNIT/ML ~~LOC~~ SOLN
6.0000 [IU] | Freq: Every day | SUBCUTANEOUS | Status: DC
  Administered 2023-01-03 – 2023-01-11 (×9): 6 [IU] via SUBCUTANEOUS
  Filled 2023-01-03 (×12): qty 0.06

## 2023-01-03 MED ORDER — ONDANSETRON HCL 4 MG/2ML IJ SOLN
INTRAMUSCULAR | Status: AC
  Filled 2023-01-03: qty 2

## 2023-01-03 MED ORDER — METHOCARBAMOL 1000 MG/10ML IJ SOLN: 500.0000 mg | Freq: Four times a day (QID) | INTRAVENOUS | Status: DC | PRN

## 2023-01-03 MED ORDER — HYDROCODONE-ACETAMINOPHEN 5-325 MG PO TABS
ORAL_TABLET | ORAL | Status: AC
  Filled 2023-01-03: qty 1

## 2023-01-03 MED ORDER — ROSUVASTATIN CALCIUM 5 MG PO TABS
10.0000 mg | ORAL_TABLET | Freq: Every day | ORAL | Status: DC
  Administered 2023-01-03 – 2023-01-13 (×11): 10 mg via ORAL
  Filled 2023-01-03 (×12): qty 2

## 2023-01-03 MED ORDER — METOCLOPRAMIDE HCL 5 MG PO TABS: 5.0000 mg | ORAL_TABLET | Freq: Three times a day (TID) | ORAL | Status: DC | PRN

## 2023-01-03 MED ORDER — DEXAMETHASONE SODIUM PHOSPHATE 10 MG/ML IJ SOLN
INTRAMUSCULAR | Status: DC | PRN
  Administered 2023-01-03: 5 mg via INTRAVENOUS

## 2023-01-03 MED ORDER — HYDROXYZINE HCL 10 MG PO TABS
10.0000 mg | ORAL_TABLET | Freq: Three times a day (TID) | ORAL | Status: DC | PRN
  Administered 2023-01-05 – 2023-01-12 (×2): 10 mg via ORAL
  Filled 2023-01-03 (×2): qty 1

## 2023-01-03 MED ORDER — CHLORHEXIDINE GLUCONATE 0.12 % MT SOLN
15.0000 mL | Freq: Once | OROMUCOSAL | Status: AC
  Administered 2023-01-03: 15 mL via OROMUCOSAL
  Filled 2023-01-03: qty 15

## 2023-01-03 MED ORDER — TRAZODONE HCL 50 MG PO TABS
50.0000 mg | ORAL_TABLET | Freq: Every day | ORAL | Status: DC
  Administered 2023-01-03 – 2023-01-13 (×11): 50 mg via ORAL
  Filled 2023-01-03 (×11): qty 1

## 2023-01-03 MED ORDER — PHENYLEPHRINE HCL-NACL 20-0.9 MG/250ML-% IV SOLN
INTRAVENOUS | Status: DC | PRN
  Administered 2023-01-03: 50 ug/min via INTRAVENOUS
  Administered 2023-01-03 (×3): 80 ug via INTRAVENOUS

## 2023-01-03 MED ORDER — ONDANSETRON HCL 4 MG PO TABS: 4.0000 mg | ORAL_TABLET | Freq: Four times a day (QID) | ORAL | Status: DC | PRN

## 2023-01-03 MED ORDER — AMITRIPTYLINE HCL 10 MG PO TABS
10.0000 mg | ORAL_TABLET | Freq: Every day | ORAL | Status: DC
  Administered 2023-01-03 – 2023-01-13 (×11): 10 mg via ORAL
  Filled 2023-01-03 (×12): qty 1

## 2023-01-03 MED ORDER — INSULIN ASPART 100 UNIT/ML IJ SOLN
0.0000 [IU] | Freq: Three times a day (TID) | INTRAMUSCULAR | Status: DC
  Administered 2023-01-03: 5 [IU] via SUBCUTANEOUS
  Administered 2023-01-04: 1 [IU] via SUBCUTANEOUS
  Administered 2023-01-04: 2 [IU] via SUBCUTANEOUS
  Administered 2023-01-04 – 2023-01-05 (×2): 1 [IU] via SUBCUTANEOUS
  Administered 2023-01-05: 2 [IU] via SUBCUTANEOUS
  Administered 2023-01-06: 1 [IU] via SUBCUTANEOUS
  Administered 2023-01-06: 2 [IU] via SUBCUTANEOUS
  Administered 2023-01-06: 1 [IU] via SUBCUTANEOUS
  Administered 2023-01-07: 5 [IU] via SUBCUTANEOUS
  Administered 2023-01-08: 2 [IU] via SUBCUTANEOUS
  Administered 2023-01-08: 3 [IU] via SUBCUTANEOUS
  Administered 2023-01-09: 5 [IU] via SUBCUTANEOUS
  Administered 2023-01-09 – 2023-01-13 (×6): 1 [IU] via SUBCUTANEOUS

## 2023-01-03 MED ORDER — FENTANYL CITRATE (PF) 250 MCG/5ML IJ SOLN
INTRAMUSCULAR | Status: DC | PRN
  Administered 2023-01-03 (×2): 50 ug via INTRAVENOUS
  Administered 2023-01-03: 25 ug via INTRAVENOUS

## 2023-01-03 MED ORDER — CEFAZOLIN SODIUM-DEXTROSE 2-4 GM/100ML-% IV SOLN
2.0000 g | Freq: Three times a day (TID) | INTRAVENOUS | Status: AC
  Administered 2023-01-03 – 2023-01-04 (×3): 2 g via INTRAVENOUS
  Filled 2023-01-03 (×3): qty 100

## 2023-01-03 MED ORDER — LABETALOL HCL 5 MG/ML IV SOLN
10.0000 mg | Freq: Once | INTRAVENOUS | Status: AC
  Administered 2023-01-03: 10 mg via INTRAVENOUS

## 2023-01-03 MED ORDER — ONDANSETRON HCL 4 MG/2ML IJ SOLN: 4.0000 mg | Freq: Four times a day (QID) | INTRAMUSCULAR | Status: DC | PRN

## 2023-01-03 MED ORDER — MORPHINE SULFATE (PF) 2 MG/ML IV SOLN: 0.5000 mg | INTRAVENOUS | Status: DC | PRN

## 2023-01-03 MED ORDER — MELATONIN 5 MG PO TABS
10.0000 mg | ORAL_TABLET | Freq: Every evening | ORAL | Status: DC | PRN
  Administered 2023-01-12: 10 mg via ORAL
  Filled 2023-01-03: qty 2

## 2023-01-03 MED ORDER — CEFAZOLIN SODIUM-DEXTROSE 2-4 GM/100ML-% IV SOLN
2.0000 g | INTRAVENOUS | Status: AC
  Administered 2023-01-03: 2 g via INTRAVENOUS
  Filled 2023-01-03: qty 100

## 2023-01-03 MED ORDER — ALBUMIN HUMAN 5 % IV SOLN: INTRAVENOUS | Status: DC | PRN

## 2023-01-03 MED ORDER — ACETAMINOPHEN 500 MG PO TABS: 1000.0000 mg | ORAL_TABLET | Freq: Once | ORAL | Status: DC

## 2023-01-03 MED ORDER — METHOCARBAMOL 500 MG PO TABS
500.0000 mg | ORAL_TABLET | Freq: Four times a day (QID) | ORAL | Status: DC | PRN
  Administered 2023-01-05 – 2023-01-08 (×6): 500 mg via ORAL
  Filled 2023-01-03 (×6): qty 1

## 2023-01-03 MED ORDER — METOPROLOL TARTRATE 25 MG PO TABS
25.0000 mg | ORAL_TABLET | Freq: Two times a day (BID) | ORAL | Status: DC
  Administered 2023-01-03 – 2023-01-14 (×20): 25 mg via ORAL
  Filled 2023-01-03 (×22): qty 1

## 2023-01-03 MED ORDER — VENLAFAXINE HCL ER 150 MG PO CP24
150.0000 mg | ORAL_CAPSULE | Freq: Every day | ORAL | Status: DC
  Administered 2023-01-04 – 2023-01-14 (×11): 150 mg via ORAL
  Filled 2023-01-03 (×11): qty 1

## 2023-01-03 MED ORDER — HYDROCODONE-ACETAMINOPHEN 5-325 MG PO TABS
1.0000 | ORAL_TABLET | ORAL | Status: DC | PRN
  Administered 2023-01-03: 1 via ORAL
  Administered 2023-01-04 – 2023-01-06 (×3): 2 via ORAL
  Administered 2023-01-12 (×2): 1 via ORAL
  Administered 2023-01-13: 2 via ORAL
  Filled 2023-01-03: qty 1
  Filled 2023-01-03 (×2): qty 2
  Filled 2023-01-03: qty 1
  Filled 2023-01-03 (×3): qty 2

## 2023-01-03 MED ORDER — IRBESARTAN 150 MG PO TABS
150.0000 mg | ORAL_TABLET | Freq: Every day | ORAL | Status: DC
  Administered 2023-01-04 – 2023-01-14 (×11): 150 mg via ORAL
  Filled 2023-01-03 (×11): qty 1

## 2023-01-03 MED ORDER — METFORMIN HCL 500 MG PO TABS
500.0000 mg | ORAL_TABLET | Freq: Two times a day (BID) | ORAL | Status: DC
  Administered 2023-01-03 – 2023-01-14 (×22): 500 mg via ORAL
  Filled 2023-01-03 (×22): qty 1

## 2023-01-03 MED ORDER — ORAL CARE MOUTH RINSE: 15.0000 mL | Freq: Once | OROMUCOSAL | Status: AC

## 2023-01-03 MED ORDER — POTASSIUM CHLORIDE CRYS ER 10 MEQ PO TBCR
10.0000 meq | EXTENDED_RELEASE_TABLET | Freq: Two times a day (BID) | ORAL | Status: DC
  Administered 2023-01-03 – 2023-01-04 (×2): 10 meq via ORAL
  Filled 2023-01-03 (×2): qty 1

## 2023-01-03 MED ORDER — LACTATED RINGERS IV SOLN: INTRAVENOUS | Status: DC

## 2023-01-03 MED ORDER — METOCLOPRAMIDE HCL 5 MG/ML IJ SOLN: 5.0000 mg | Freq: Three times a day (TID) | INTRAMUSCULAR | Status: DC | PRN

## 2023-01-03 MED ORDER — INSULIN ASPART 100 UNIT/ML IJ SOLN
0.0000 [IU] | Freq: Every day | INTRAMUSCULAR | Status: DC
  Administered 2023-01-03: 3 [IU] via SUBCUTANEOUS

## 2023-01-03 MED ORDER — LIDOCAINE 2% (20 MG/ML) 5 ML SYRINGE
INTRAMUSCULAR | Status: DC | PRN
  Administered 2023-01-03: 20 mg via INTRAVENOUS

## 2023-01-03 SURGICAL SUPPLY — 37 items
APL PRP STRL LF DISP 70% ISPRP (MISCELLANEOUS) ×2
BLADE SAW SGTL 18.5X63.X.64 HD (BLADE) ×1 IMPLANT
BNDG CMPR 5X62 HK CLSR LF (GAUZE/BANDAGES/DRESSINGS) ×1
BNDG CMPR 6"X 5 YARDS HK CLSR (GAUZE/BANDAGES/DRESSINGS) ×1
BNDG ELASTIC 6INX 5YD STR LF (GAUZE/BANDAGES/DRESSINGS) IMPLANT
BNDG ELASTIC 6X5.8 VLCR STR LF (GAUZE/BANDAGES/DRESSINGS) IMPLANT
CHLORAPREP W/TINT 26 (MISCELLANEOUS) ×1 IMPLANT
COVER SURGICAL LIGHT HANDLE (MISCELLANEOUS) ×1 IMPLANT
CUFF TOURN SGL QUICK 34 (TOURNIQUET CUFF) ×1
CUFF TRNQT CYL 34X4.125X (TOURNIQUET CUFF) ×1 IMPLANT
DRAPE U-SHAPE 47X51 STRL (DRAPES) ×1 IMPLANT
DRSG MEPITEL 4X7.2 (GAUZE/BANDAGES/DRESSINGS) IMPLANT
ELECT REM PT RETURN 9FT ADLT (ELECTROSURGICAL) ×1
ELECTRODE REM PT RTRN 9FT ADLT (ELECTROSURGICAL) ×1 IMPLANT
GAUZE PAD ABD 8X10 STRL (GAUZE/BANDAGES/DRESSINGS) IMPLANT
GAUZE SPONGE 4X4 12PLY STRL (GAUZE/BANDAGES/DRESSINGS) ×1 IMPLANT
GLOVE BIO SURGEON STRL SZ 6.5 (GLOVE) ×2 IMPLANT
GLOVE BIO SURGEON STRL SZ7.5 (GLOVE) ×1 IMPLANT
GLOVE BIOGEL PI IND STRL 7.5 (GLOVE) ×1 IMPLANT
GOWN STRL REUS W/ TWL LRG LVL3 (GOWN DISPOSABLE) ×3 IMPLANT
GOWN STRL REUS W/TWL LRG LVL3 (GOWN DISPOSABLE) ×3
KIT BASIN OR (CUSTOM PROCEDURE TRAY) ×1 IMPLANT
KIT TURNOVER KIT B (KITS) ×1 IMPLANT
MANIFOLD NEPTUNE II (INSTRUMENTS) ×1 IMPLANT
NS IRRIG 1000ML POUR BTL (IV SOLUTION) ×1 IMPLANT
PACK ORTHO EXTREMITY (CUSTOM PROCEDURE TRAY) ×1 IMPLANT
PAD CAST 4YDX4 CTTN HI CHSV (CAST SUPPLIES) IMPLANT
PADDING CAST COTTON 4X4 STRL (CAST SUPPLIES) ×1
SUT ETHILON 3 0 PS 1 (SUTURE) ×1 IMPLANT
SUT MON AB 2-0 CT1 36 (SUTURE) ×2 IMPLANT
SUT SILK 2 0 (SUTURE) ×1
SUT SILK 2-0 18XBRD TIE 12 (SUTURE) ×1 IMPLANT
SUT VIC AB 0 CT1 27 (SUTURE) ×2
SUT VIC AB 0 CT1 27XBRD ANBCTR (SUTURE) ×1 IMPLANT
TOWEL GREEN STERILE (TOWEL DISPOSABLE) ×1 IMPLANT
UNDERPAD 30X36 HEAVY ABSORB (UNDERPADS AND DIAPERS) ×1 IMPLANT
WATER STERILE IRR 1000ML POUR (IV SOLUTION) ×1 IMPLANT

## 2023-01-03 NOTE — Interval H&P Note (Signed)
History and Physical Interval Note:  01/03/2023 7:22 AM  Cassandra [name]  has presented today for surgery, with the diagnosis of Left ankle chronic osteomyelitis.  The various methods of treatment have been discussed with the patient and family. After consideration of risks, benefits and other options for treatment, the patient has consented to  Procedure(s): AMPUTATION BELOW KNEE (Left) as a surgical intervention.  The patient's history has been reviewed, patient examined, no change in status, stable for surgery.  I have reviewed the patient's chart and labs.  Questions were answered to the patient's satisfaction.     Caryn Bee P Anjenette Gerbino

## 2023-01-03 NOTE — Op Note (Signed)
Orthopaedic Surgery Operative Note (CSN: [id number] ) Date of Surgery: 01/03/2023  Admit Date: 01/03/2023   Diagnoses: Pre-Op Diagnoses: Left chronic osteomyelitis of left ankle/tibia  Post-Op Diagnosis: Same  Procedures: CPT 27880-Below knee amputation left leg  Surgeons : Primary: Roby Lofts, MD  Assistant: Ulyses Southward, PA-C  Location: OR 3   Anesthesia:General with regional block   Antibiotics: Ancef 2g preop   Tourniquet time:  Total Tourniquet Time Documented: Thigh (Left) - 35 minutes Total: Thigh (Left) - 35 minutes   Estimated Blood Loss:50 mL  Complications: None   Specimens: ID Type Source Tests Collected by Time Destination  1 : Left below knee amputation Tissue PATH Amputaion Arm/Leg SURGICAL PATHOLOGY Roby Lofts, MD 01/03/2023 [phone]      Implants: * No implants in log *   Indications for Surgery: 89 year old female who sustained a left open ankle fracture dislocation was treated with open reduction internal fixation she went on to develop posttraumatic arthritis and at the surgery to fuse her ankle it was found to have a infection that was treated with a repeat I&D's and prolonged antibiotics.  She had persistent osteomyelitis without any clearance of the infection.  As result after long discussions with multiple providers was recommended to proceed with below-knee amputation.  Risks included but not limited to bleeding, infection, stump pain, neuropathic pain, need for prosthesis, even possible anesthetic complications.  She agreed to proceed with surgery and consent was obtained.  Operative Findings: Successful below-knee amputation without any complications.  Procedure: The patient was identified in the preoperative holding area. Consent was confirmed with the patient and their family and all questions were answered. The operative extremity was marked after confirmation with the patient. she was then brought back to the operating room by our  anesthesia colleagues.  She was carefully transferred over to radiolucent flattop table.  She was placed under anesthetic.  Her left lower extremity was then prepped and draped in usual sterile fashion.  Timeout was performed to verify the patient, the procedure, and the extremity.  Preoperative antibiotics were dosed.  Left for started out by inflating the tourniquet to 250 mmHg.  Total tourniquet time as noted above.  I marked out a incision approximately 7 to 8 cm below the tibial tubercle.  I then incised through the anterior skin and anterior musculature dissected carefully through the anterior compartment.  I identified the vascular bundle and nerve to the anterior lateral compartments and I tied off the vascular bundle and tension the peroneal nerve and sharply dissected it as it retracted into the muscle.  I then used an oscillating saw to cut through the tibia.  I then visualized the fibula and resected that approximately 1 cm proximal to the tibia.  I then carefully dissected through the posterior compartment identifying the posterior neurovascular bundle.  I tied off the vessels and cut through the nerve and sharply dissected this proximally.  I then continued through the deep posterior musculature and extended my dissection down distally to bring up a skin flap of the superficial posterior compartment.  At this point the incision was irrigated.  I then used 0 Vicryl suture to close the fascia overlying the tibia.  I then deflated the tourniquet.  There was no pulsatile bleeding or significant bleeding from the stump I then proceeded to closed the remainder with 2-0 Monocryl and 3-0 nylon.  Sterile dressing was applied.  The patient was then awoke from anesthesia and taken to the PACU in stable condition.  Post Op Plan/Instructions: Patient will be nonweightbearing to the left lower extremity.  She will be discontinued from her antibiotics.  Will have her work with physical and Occupational  Therapy.  We fit her for stump shrinker.  She will be placed on her blood thinners postoperative day 1 if her hemoglobin is stabilized.  I was present and performed the entire surgery.  Ulyses Southward, PA-C did assist me throughout the case. An assistant was necessary given the difficulty in approach, maintenance of reduction and ability to instrument the fracture.   Truitt Merle, MD [facility]

## 2023-01-03 NOTE — Transfer of Care (Signed)
Immediate Anesthesia Transfer of Care Note  Patient: Cassandra [name]  Procedure(s) Performed: AMPUTATION BELOW KNEE (Left: Knee)  Patient Location: PACU  Anesthesia Type:General  Level of Consciousness: drowsy  Airway & Oxygen Therapy: Patient Spontanous Breathing  Post-op Assessment: Report given to RN and Post -op Vital signs reviewed and stable  Post vital signs: Reviewed and stable  Last Vitals:  Vitals Value Taken Time  BP 148/59 01/03/23 0916  Temp    Pulse 67 01/03/23 0920  Resp 18 01/03/23 0921  SpO2 91 % 01/03/23 0920  Vitals shown include unvalidated device data.  Last Pain:  Vitals:   01/03/23 0625  TempSrc:   PainSc: 0-No pain         Complications: No notable events documented.

## 2023-01-03 NOTE — Anesthesia Procedure Notes (Signed)
Procedure Name: LMA Insertion Date/Time: 01/03/2023 7:54 AM  Performed by: Loleta Tyeesha Riker, CRNAPre-anesthesia Checklist: Patient identified, Patient being monitored, Timeout performed, Emergency Drugs available and Suction available Patient Re-evaluated:Patient Re-evaluated prior to induction Oxygen Delivery Method: Circle system utilized Preoxygenation: Pre-oxygenation with 100% oxygen Induction Type: IV induction Ventilation: Mask ventilation without difficulty LMA: LMA inserted LMA Size: 4.0 Tube type: Oral Number of attempts: 1 Placement Confirmation: positive ETCO2 and breath sounds checked- equal and bilateral Tube secured with: Tape Dental Injury: Teeth and Oropharynx as per pre-operative assessment
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
## 2023-01-03 NOTE — Progress Notes (Signed)
Orthopedic Tech Progress Note Patient Details:  SHANET SHARIFF [birth year] [id number]  Patient ID: Lethea Killings, female   DOB: 1933-05-27, 89 y.o.   MRN: [id number] Order placed with [employer]. Darleen Crocker 01/03/2023, 2:53 PM

## 2023-01-04 LAB — BASIC METABOLIC PANEL
Anion gap: 8 (ref 5–15)
BUN: 40 mg/dL — ABNORMAL HIGH (ref 8–23)
CO2: 23 mmol/L (ref 22–32)
Calcium: 8.5 mg/dL — ABNORMAL LOW (ref 8.9–10.3)
Chloride: 104 mmol/L (ref 98–111)
Creatinine, Ser: 2.01 mg/dL — ABNORMAL HIGH (ref 0.44–1.00)
GFR, Estimated: 23 mL/min — ABNORMAL LOW (ref >60)
Glucose, Bld: 151 mg/dL — ABNORMAL HIGH (ref 70–99)
Potassium: 5.2 mmol/L — ABNORMAL HIGH (ref 3.5–5.1)
Sodium: 135 mmol/L (ref 135–145)

## 2023-01-04 LAB — CBC
HCT: 26.5 % — ABNORMAL LOW (ref 36.0–46.0)
Hemoglobin: 8.7 g/dL — ABNORMAL LOW (ref 12.0–15.0)
MCH: 28.3 pg (ref 26.0–34.0)
MCHC: 32.8 g/dL (ref 30.0–36.0)
MCV: 86.3 fL (ref 80.0–100.0)
Platelets: 284 10*3/uL (ref 150–400)
RBC: 3.07 MIL/uL — ABNORMAL LOW (ref 3.87–5.11)
RDW: 18.3 % — ABNORMAL HIGH (ref 11.5–15.5)
WBC: 13 10*3/uL — ABNORMAL HIGH (ref 4.0–10.5)
nRBC: 0 % (ref 0.0–0.2)

## 2023-01-04 LAB — GLUCOSE, CAPILLARY
Glucose-Capillary: 140 mg/dL — ABNORMAL HIGH (ref 70–99)
Glucose-Capillary: 199 mg/dL — ABNORMAL HIGH (ref 70–99)
Glucose-Capillary: 145 mg/dL — ABNORMAL HIGH (ref 70–99)
Glucose-Capillary: 156 mg/dL — ABNORMAL HIGH (ref 70–99)

## 2023-01-04 LAB — POTASSIUM: Potassium: 5.3 mmol/L — ABNORMAL HIGH (ref 3.5–5.1)

## 2023-01-04 MED ORDER — CLOPIDOGREL BISULFATE 75 MG PO TABS
75.0000 mg | ORAL_TABLET | Freq: Every day | ORAL | Status: DC
  Administered 2023-01-05 – 2023-01-14 (×10): 75 mg via ORAL
  Filled 2023-01-04 (×10): qty 1

## 2023-01-04 NOTE — Progress Notes (Signed)
Physical Therapy Evaluation Patient Details Name: Cassandra [name] MRN: [id number] DOB: 07-May-1933 Today's Date: 01/04/2023  History of Present Illness  89 yo female with onset of failed management of osteomyelitis was admitted 5/3 and had BKA on 5/4.  PMHx:  T2DM, CAD, sleep apnea, hx of breast cancer, hx of MI, neuromuscular disorder, COPD, hx of visual hallucinations, CKD, HTN  Clinical Impression  Pt was seen for mobility on RW with help to stand, unable to take a step yet.  Pt is not having pain yet, agreeable to consider rehab for recovery of strength.  Her progress is expected to be good in rehab as she was in therapy prior to this admission and had begun walking again.  Follow her for strength and balance training, to work toward gait skills and to increase endurance toward inpt rehab upon completion of acute care needs.  PT goals are outlined below.       Recommendations for follow up therapy are one component of a multi-disciplinary discharge planning process, led by the attending physician.  Recommendations may be updated based on patient status, additional functional criteria and insurance authorization.  Follow Up Recommendations       Assistance Recommended at Discharge Frequent or constant Supervision/Assistance  Patient can return home with the following  A little help with walking and/or transfers;A little help with bathing/dressing/bathroom;Assistance with cooking/housework;Assist for transportation;Help with stairs or ramp for entrance    Equipment Recommendations None recommended by PT  Recommendations for Other Services  Rehab consult    Functional Status Assessment Patient has had a recent decline in their functional status and demonstrates the ability to make significant improvements in function in a reasonable and predictable amount of time.     Precautions / Restrictions Precautions Precautions: Fall Restrictions Weight Bearing Restrictions: Yes LLE Weight  Bearing: Non weight bearing      Mobility  Bed Mobility Overal bed mobility: Needs Assistance Bed Mobility: Rolling, Sidelying to Sit, Sit to Sidelying Rolling: Min guard Sidelying to sit: Min guard     Sit to sidelying: Min guard General bed mobility comments: using elevated HOB    Transfers Overall transfer level: Needs assistance Equipment used: Rolling walker (2 wheels) Transfers: Sit to/from Stand Sit to Stand: Min assist           General transfer comment: min assist and support on R foot to avoid a slide of posture    Ambulation/Gait               General Gait Details: attempted but cannot yet  Stairs            Wheelchair Mobility    Modified Rankin (Stroke Patients Only)       Balance Overall balance assessment: Needs assistance Sitting-balance support: Feet supported Sitting balance-Leahy Scale: Fair     Standing balance support: Bilateral upper extremity supported, During functional activity Standing balance-Leahy Scale: Poor                               Pertinent Vitals/Pain Pain Assessment Pain Assessment: Faces Faces Pain Scale: No hurt    Home Living Family/patient expects to be discharged to:: Private residence Living Arrangements: Spouse/significant other Available Help at Discharge: Family;Available 24 hours/day Type of Home: Apartment Home Access: Level entry       Home Layout: One level Home Equipment: Shower seat;Wheelchair Financial trader (4 wheels);Rolling Walker (2 wheels);Cane - single point;Grab bars - toilet;BSC/3in1;Grab  bars - tub/shower Additional Comments: 4'11" requires youth walker    Prior Function Prior Level of Function : Needs assist       Physical Assist : Mobility (physical) Mobility (physical): Gait;Transfers   Mobility Comments: recent PT to start walking on RW and uses wc for community ADLs Comments: home with husband to help     Hand Dominance   Dominant Hand:  Left    Extremity/Trunk Assessment   Upper Extremity Assessment Upper Extremity Assessment: Overall WFL for tasks assessed    Lower Extremity Assessment Lower Extremity Assessment: Generalized weakness    Cervical / Trunk Assessment Cervical / Trunk Assessment: Kyphotic (mild change)  Communication   Communication: HOH  Cognition Arousal/Alertness: Awake/alert Behavior During Therapy: WFL for tasks assessed/performed Overall Cognitive Status: Within Functional Limits for tasks assessed                                 General Comments: answering questions appropriately but needs repetition only for hearing        General Comments General comments (skin integrity, edema, etc.): Pt is up to stand and attempted walking, good effort and controlled with RW and help to keep R foot from sliding    Exercises     Assessment/Plan    PT Assessment Patient needs continued PT services  PT Problem List Decreased strength;Decreased balance;Decreased mobility;Decreased activity tolerance;Decreased skin integrity       PT Treatment Interventions DME instruction;Gait training;Functional mobility training;Therapeutic activities;Therapeutic exercise;Balance training;Neuromuscular re-education;Patient/family education    PT Goals (Current goals can be found in the Care Plan section)  Acute Rehab PT Goals Patient Stated Goal: to go home and be independent to walk again PT Goal Formulation: With patient Time For Goal Achievement: 01/18/23 Potential to Achieve Goals: Good    Frequency Min 3X/week     Co-evaluation               AM-PAC PT "6 Clicks" Mobility  Outcome Measure Help needed turning from your back to your side while in a flat bed without using bedrails?: None Help needed moving from lying on your back to sitting on the side of a flat bed without using bedrails?: A Little Help needed moving to and from a bed to a chair (including a wheelchair)?: A  Little Help needed standing up from a chair using your arms (e.g., wheelchair or bedside chair)?: A Little Help needed to walk in hospital room?: Total Help needed climbing 3-5 steps with a railing? : Total 6 Click Score: 15    End of Session Equipment Utilized During Treatment: Gait belt Activity Tolerance: Patient tolerated treatment well;Patient limited by fatigue Patient left: in bed;with call bell/phone within reach;with bed alarm set;with nursing/sitter in room Nurse Communication: Mobility status PT Visit Diagnosis: Unsteadiness on feet (R26.81);Muscle weakness (generalized) (M62.81);Difficulty in walking, not elsewhere classified (R26.2)    Time: 1039-1100 PT Time Calculation (min) (ACUTE ONLY): 21 min   Charges:   PT Evaluation $PT Eval Moderate Complexity: 1 Mod         Ivar Drape 01/04/2023, 12:32 PM  Samul Dada, PT PhD Acute Rehab Dept. Number: [facility] [id number] and [facility] [phone]

## 2023-01-04 NOTE — Progress Notes (Signed)
PT Cancellation Note  Patient Details Name: Cassandra [name] MRN: [id number] DOB: [birth year]   Cancelled Treatment:    Reason Eval/Treat Not Completed: Other (comment).   Receiving meds, return at another time.   Ivar Drape 01/04/2023, 10:37 AM  Samul Dada, PT PhD Acute Rehab Dept. Number: [facility] [id number] and [facility] [phone]

## 2023-01-04 NOTE — Evaluation (Signed)
Clinical/Bedside Swallow Evaluation Patient Details  Name: Cassandra [name] MRN: [id number] Date of Birth: September 12, 1932  Today's Date: 01/04/2023 Time: SLP Start Time (ACUTE ONLY): 1500 SLP Stop Time (ACUTE ONLY): 1526 SLP Time Calculation (min) (ACUTE ONLY): 26 min  Past Medical History:  Past Medical History:  Diagnosis Date   Acquired hypothyroidism 01/06/2014   Anxiety    Arthritis    B12 deficiency 02/15/2014   Benign essential hypertension 01/06/2014   Breast cancer (HCC) 1982   Right breast cancer - chemotherapy   CAD (coronary artery disease), native coronary artery 01/06/2014   Carotid artery calcification    Chronic airway obstruction, not elsewhere classified 01/06/2014   Chronic mesenteric ischemia (HCC)    s/p SMA stent 11/03/19   Depression    Diabetes mellitus without complication (HCC)    Dysrhythmia    aflutter with RVR 06/2016   History of kidney stones 2019   left ureteral stone   Incontinence of urine    Myocardial infarction (HCC) 1982   small infarct   Neuromuscular disorder (HCC)    restless legs   Pacemaker    Presence of permanent cardiac pacemaker 07/2016   Pure hypercholesterolemia 01/06/2014   Renal insufficiency    Skin cancer    Sleep apnea    no longer uses a cpap. Uses oxygen as needed   Past Surgical History:  Past Surgical History:  Procedure Laterality Date   ANKLE FUSION Left 10/10/2021   Procedure: ANKLE FUSION;  Surgeon: Roby Lofts, MD;  Location: [facility] OR;  Service: Orthopedics;  Laterality: Left;   APPENDECTOMY     APPLICATION OF WOUND VAC Left 02/24/2021   Procedure: APPLICATION OF WOUND VAC;  Surgeon: Samson Frederic, MD;  Location: [facility] OR;  Service: Orthopedics;  Laterality: Left;   AUGMENTATION MAMMAPLASTY Bilateral 1982/redo in 2014   prior mastectomy   CARDIAC CATHETERIZATION N/A 04/23/2016   Procedure: Left Heart Cath and Coronary Angiography;  Surgeon: Lamar Blinks, MD;  Location: [facility] INVASIVE CV LAB;  Service:  Cardiovascular;  Laterality: N/A;   CARDIAC CATHETERIZATION Left 04/23/2016   Procedure: Coronary Stent Intervention;  Surgeon: Alwyn Pea, MD;  Location: [facility] INV[facility] CV LAB;  Service: Cardiovascular;  Laterality: Left;   CAROTID ARTERY ANGIOPLASTY Left 2010   had stent inserted and removed d/t infection. artery from leg inserted in left carotid   CAROTID ENDARTERECTOMY Left 2010   left CEA ~ 2010, s/p excision infected left carotid patch & pseudoaneurysm with left CCA-ICA bypass using SVG 05/20/12   CHOLECYSTECTOMY     CORONARY ANGIOPLASTY WITH STENT PLACEMENT  september 9th 2017   CYSTOSCOPY W/ URETERAL STENT PLACEMENT Left 08/17/2017   Procedure: CYSTOSCOPY WITH RETROGRADE PYELOGRAM/URETERAL STENT PLACEMENT;  Surgeon: Jerilee Field, MD;  Location: [facility] ORS;  Service: Urology;  Laterality: Left;   CYSTOSCOPY W/ URETERAL STENT PLACEMENT Left 09/16/2017   Procedure: CYSTOSCOPY WITH STENT REPLACEMENT;  Surgeon: [name], MD;  Location: [facility] ORS;  Service: Urology;  Laterality: Left;   CYSTOSCOPY/RETROGRADE/URETEROSCOPY/STONE EXTRACTION WITH BASKET Left 09/16/2017   Procedure: CYSTOSCOPY/RETROGRADE/URETEROSCOPY/STONE EXTRACTION WITH BASKET;  Surgeon: [name], MD;  Location: [facility] ORS;  Service: Urology;  Laterality: Left;   EXTERNAL FIXATION LEG Left 02/24/2021   Procedure: EXTERNAL FIXATION  ANKLE;  Surgeon: Samson Frederic, MD;  Location: [facility];  Service: Orthopedics;  Laterality: Left;   EXTERNAL FIXATION REMOVAL Left 02/26/2021   Procedure: REMOVAL EXTERNAL FIXATION LEG;  Surgeon: Roby Lofts, MD;  Location: [facility];  Service: Orthopedics;  Laterality: Left;   EYE SURGERY Bilateral    cataract extraction   I & D EXTREMITY Left 02/24/2021   Procedure: IRRIGATION AND DEBRIDEMENT EXTREMITY;  Surgeon: Samson Frederic, MD;  Location: [facility];  Service: Orthopedics;  Laterality: Left;   I & D EXTREMITY Left 12/21/2021   Procedure: IRRIGATION AND DEBRIDEMENT LEFT ANKLE;   Surgeon: Roby Lofts, MD;  Location: [facility];  Service: Orthopedics;  Laterality: Left;   I & D EXTREMITY Left 05/03/2022   Procedure: DEBRIDEMENT OF LEFT ANKLE INFECTION;  Surgeon: Roby Lofts, MD;  Location: [facility];  Service: Orthopedics;  Laterality: Left;   IR FLUORO GUIDE CV LINE LEFT  12/27/2021   IR FLUORO GUIDE CV LINE RIGHT  10/12/2021   IR REMOVAL TUN CV CATH W/O FL  11/22/2021   IR REMOVAL TUN CV CATH W/O FL  10/10/2022   IR US GUIDE VASC ACCESS LEFT  12/27/2021   IR US GUIDE VASC ACCESS RIGHT  10/12/2021   KYPHOPLASTY N/A 02/03/2018   Procedure: [id number];  Surgeon: Kennedy Bucker, MD;  Location: [facility] ORS;  Service: Orthopedics;  Laterality: N/A;   MASTECTOMY Right 1982   ORIF ANKLE FRACTURE Left 02/26/2021   Procedure: OPEN REDUCTION INTERNAL FIXATION (ORIF) ANKLE FRACTURE;  Surgeon: Roby Lofts, MD;  Location: [facility];  Service: Orthopedics;  Laterality: Left;   PACEMAKER INSERTION Left 07/03/2016   Procedure: INSERTION PACEMAKER;  Surgeon: Marcina Millard, MD;  Location: [facility] OR[facility];  Service: Cardiovascular;  Laterality: Left;   SKIN CANCER EXCISION     TONSILLECTOMY     TUBAL LIGATION     VISCERAL ANGIOGRAPHY N/A 11/03/2019   Procedure: VISCERAL ANGIOGRAPHY;  Surgeon: Renford Dills, MD;  Location: [facility];  Service: Cardiovascular;  Laterality: N/A;   HPI:  89 yo female with onset of failed management of osteomyelitis was admitted 5/3 and had BKA on 5/4.  PMHx:  T2DM, CAD, sleep apnea, hx of breast cancer, hx of MI, neuromuscular disorder, COPD, hx of visual hallucinations, CKD, HTN. Pt completed an MBS on 08/16/22 with laryngeal penetration of thin liquid and recommendations for regular solids and thin liquids with no straws.    Assessment / Plan / Recommendation  Clinical Impression  Pt was seen for a clinical swallow evaluation and presents with suspected mild oropharyngeal dysphagia.  Pt was encountered awake/alert in bed and reported that she was  tolerating her current diet of regular solids and thin liquids without difficulty.  Pt was seen with trials of thin liquid and regular solids (cup and straw sip).  Mastication was timely and no overt s/sx of aspiration were observed.  Deep laryngeal penetration of thin liquid was observed on previous MBS and sensed aspiration of thin liquid with cough reflex was noted on recent esophagram.  Therefore, recommend continuation of regular solids and thin liquids with no straws.  SLP will f/u to monitor diet tolerance.  If clinical s/sx of aspiration are observed, recommend repeat MBS to further evaluate.  SLP Visit Diagnosis: Dysphagia, oropharyngeal phase (R13.12)    Aspiration Risk  Mild aspiration risk    Diet Recommendation Regular;Thin liquid   Liquid Administration via: Cup;No straw Medication Administration: Whole meds with liquid Supervision: Patient able to self feed Compensations: Minimize environmental distractions;Slow rate;Small sips/bites Postural Changes: Seated upright at 90 degrees    Other  Recommendations Oral Care Recommendations: Oral care BID    Recommendations for follow up therapy are one component of a multi-disciplinary discharge planning process, led by  the attending physician.  Recommendations may be updated based on patient status, additional functional criteria and insurance authorization.  Follow up Recommendations Follow physician's recommendations for discharge plan and follow up therapies      Assistance Recommended at Discharge    Functional Status Assessment Patient has had a recent decline in their functional status and demonstrates the ability to make significant improvements in function in a reasonable and predictable amount of time.  Frequency and Duration min 2x/week  2 weeks       Prognosis Prognosis for improved oropharyngeal function: Good      Swallow Study   General HPI: 89 yo female with onset of failed management of osteomyelitis was  admitted 5/3 and had BKA on 5/4.  PMHx:  T2DM, CAD, sleep apnea, hx of breast cancer, hx of MI, neuromuscular disorder, COPD, hx of visual hallucinations, CKD, HTN. Pt completed an MBS on 08/16/22 with laryngeal penetration of thin liquid and recommendations for regular solids and thin liquids with no straws. Type of Study: Bedside Swallow Evaluation Previous Swallow Assessment: See HPI Diet Prior to this Study: Regular;Thin liquids (Level 0) Temperature Spikes Noted: No Respiratory Status: Room air History of Recent Intubation: No Behavior/Cognition: Alert;Cooperative;Pleasant mood Oral Cavity Assessment: Within Functional Limits Oral Care Completed by SLP: No Oral Cavity - Dentition: Dentures, top;Dentures, bottom Vision: Functional for self-feeding Self-Feeding Abilities: Able to feed self Patient Positioning: Upright in bed Baseline Vocal Quality: Normal Volitional Cough: Strong Volitional Swallow: Able to elicit    Oral/Motor/Sensory Function Overall Oral Motor/Sensory Function: Within functional limits   Ice Chips Ice chips: Not tested   Thin Liquid Thin Liquid: Within functional limits Presentation: Cup;Straw    Nectar Thick Nectar Thick Liquid: Not tested   Honey Thick Honey Thick Liquid: Not tested   Puree Puree: Not tested   Solid     Solid: Within functional limits Presentation: Self Fed     Eino Farber, M.S., CCC-SLP Acute Rehabilitation Services Office: [phone]  Shanon Rosser Quinlan Vollmer 01/04/2023,3:30 PM

## 2023-01-04 NOTE — Progress Notes (Signed)
Subjective: 1 Day Post-Op s/p Procedure(s): AMPUTATION BELOW KNEE   Patient is alert, oriented. States pain so far well controlled.  Denies chest pain, SOB, Calf pain. No nausea/vomiting. Hesitant about working with therapy today. No other complaints.   Objective:  PE: VITALS:   Vitals:   01/03/23 1916 01/03/23 2345 01/04/23 0456 01/04/23 0504  BP: (!) 161/73 131/69 133/62 137/79  Pulse: 72 71  78  Resp: 18 17 16 14   Temp: 97.8 F (36.6 C) 97.6 F (36.4 C) 98 F (36.7 C) 98.1 F (36.7 C)  TempSrc: Oral Oral Oral Oral  SpO2: 91% 92% 97% 98%  Weight:      Height:       General: sitting up in bed in no acute distress Resp: normal respiratory effort, even without nasal cannula Cardio: normal rate and rhythm GI: soft, nontender MSK: dressing CDI. Patient states sensation is intact until stump.   LABS  Results for orders placed or performed during the hospital encounter of 01/03/23 (from the past 24 hour(s))  Glucose, capillary     Status: Abnormal   Collection Time: 01/03/23  9:18 AM  Result Value Ref Range   Glucose-Capillary 137 (H) 70 - 99 mg/dL   Comment 1 Notify RN    Comment 2 Document in Chart   Glucose, capillary     Status: Abnormal   Collection Time: 01/03/23  4:14 PM  Result Value Ref Range   Glucose-Capillary 266 (H) 70 - 99 mg/dL  Glucose, capillary     Status: Abnormal   Collection Time: 01/03/23  9:32 PM  Result Value Ref Range   Glucose-Capillary 251 (H) 70 - 99 mg/dL   Comment 1 Notify RN   CBC     Status: Abnormal   Collection Time: 01/04/23  1:43 AM  Result Value Ref Range   WBC 13.0 (H) 4.0 - 10.5 K/uL   RBC 3.07 (L) 3.87 - 5.11 MIL/uL   Hemoglobin 8.7 (L) 12.0 - 15.0 g/dL   HCT 16.1 (L) 09.6 - 04.5 %   MCV 86.3 80.0 - 100.0 fL   MCH 28.3 26.0 - 34.0 pg   MCHC 32.8 30.0 - 36.0 g/dL   RDW 40.9 (H) 81.1 - 91.4 %   Platelets 284 150 - 400 K/uL   nRBC 0.0 0.0 - 0.2 %  Basic metabolic panel     Status: Abnormal   Collection Time:  01/04/23  1:43 AM  Result Value Ref Range   Sodium 135 135 - 145 mmol/L   Potassium 5.2 (H) 3.5 - 5.1 mmol/L   Chloride 104 98 - 111 mmol/L   CO2 23 22 - 32 mmol/L   Glucose, Bld 151 (H) 70 - 99 mg/dL   BUN 40 (H) 8 - 23 mg/dL   Creatinine, Ser 7.82 (H) 0.44 - 1.00 mg/dL   Calcium 8.5 (L) 8.9 - 10.3 mg/dL   GFR, Estimated 23 (L) >60 mL/min   Anion gap 8 5 - 15    No results found.  Assessment/Plan: Left chronic osteomyelitis of left ankle/tibia   1 Day Post-Op s/p Procedure(s): AMPUTATION BELOW KNEE LEFT LEG  ABLA - Hbg dropped from 10.8 pre-op to 8.7 this morning.   Weightbearing: NWB LLE Insicional and dressing care: Change dressing Sunday as needed Orthopedic device(s): Order placed with [employer] for stump shrinker to be delivered VTE prophylaxis: Home Eliquis and Plavix will be restarted when Hbg stabilizes to >9.  Pain control: continue current regimen Follow - up plan:  Pending PT/OT evals, may require CIR  Contact information:   Janine Ores, PA-C Weekdays 8-5  After hours and holidays please check [email or web address] for group call information for [facility]  Armida Sans 01/04/2023, 8:07 AM

## 2023-01-05 LAB — BASIC METABOLIC PANEL
Anion gap: 7 (ref 5–15)
BUN: 39 mg/dL — ABNORMAL HIGH (ref 8–23)
CO2: 24 mmol/L (ref 22–32)
Calcium: 8.7 mg/dL — ABNORMAL LOW (ref 8.9–10.3)
Chloride: 104 mmol/L (ref 98–111)
Creatinine, Ser: 1.58 mg/dL — ABNORMAL HIGH (ref 0.44–1.00)
GFR, Estimated: 31 mL/min — ABNORMAL LOW (ref >60)
Glucose, Bld: 227 mg/dL — ABNORMAL HIGH (ref 70–99)
Potassium: 5 mmol/L (ref 3.5–5.1)
Sodium: 135 mmol/L (ref 135–145)

## 2023-01-05 LAB — GLUCOSE, CAPILLARY
Glucose-Capillary: 128 mg/dL — ABNORMAL HIGH (ref 70–99)
Glucose-Capillary: 119 mg/dL — ABNORMAL HIGH (ref 70–99)
Glucose-Capillary: 146 mg/dL — ABNORMAL HIGH (ref 70–99)
Glucose-Capillary: 115 mg/dL — ABNORMAL HIGH (ref 70–99)

## 2023-01-05 LAB — CBC
HCT: 28.4 % — ABNORMAL LOW (ref 36.0–46.0)
Hemoglobin: 8.9 g/dL — ABNORMAL LOW (ref 12.0–15.0)
MCH: 27.7 pg (ref 26.0–34.0)
MCHC: 31.3 g/dL (ref 30.0–36.0)
MCV: 88.5 fL (ref 80.0–100.0)
Platelets: 287 10*3/uL (ref 150–400)
RBC: 3.21 MIL/uL — ABNORMAL LOW (ref 3.87–5.11)
RDW: 18.5 % — ABNORMAL HIGH (ref 11.5–15.5)
WBC: 14.8 10*3/uL — ABNORMAL HIGH (ref 4.0–10.5)
nRBC: 0 % (ref 0.0–0.2)

## 2023-01-05 NOTE — Evaluation (Signed)
Occupational Therapy Evaluation Patient Details Name: Cassandra [name] MRN: [id number] DOB: 1933/04/25 Today's Date: 01/05/2023   History of Present Illness 89 yo female with onset of failed management of osteomyelitis was admitted 5/3 and had BKA on 5/4.  PMHx:  T2DM, CAD, sleep apnea, hx of breast cancer, hx of MI, neuromuscular disorder, COPD, hx of visual hallucinations, CKD, HTN   Clinical Impression   PTA, pt was living with her husband and was independent with BADLs. Currently, pt requires Mod-Max for LB ADLs and Min-Mod A for functional transfers using RW. Pt very motivated to participate in therapy despite pain. Pt presenting with orthostatic BP and reported dizziness; RN notified. Pt would benefit from further acute OT to facilitate safe dc. Recommend dc to AIR for further OT to optimize safety, independence with ADLs, and return to PLOF.   Blood Pressures: Initial BP taken close to 7am 160/74 Reported feeling dizzy EOB and BP 118/48 Post standing trials and transfer bed to recliner 102/57 Recliner BLE elevated 90/47 5 minutes later, pt reports is comfortable in recliner 109/46 After approx 20 minutes in recliner OOB, BP 119/57     Recommendations for follow up therapy are one component of a multi-disciplinary discharge planning process, led by the attending physician.  Recommendations may be updated based on patient status, additional functional criteria and insurance authorization.   Assistance Recommended at Discharge Frequent or constant Supervision/Assistance  Patient can return home with the following A lot of help with walking and/or transfers;A lot of help with bathing/dressing/bathroom    Functional Status Assessment  Patient has had a recent decline in their functional status and demonstrates the ability to make significant improvements in function in a reasonable and predictable amount of time.  Equipment Recommendations  None recommended by OT    Recommendations  for Other Services       Precautions / Restrictions Precautions Precautions: Fall Precaution Comments: Wathc BP Restrictions Weight Bearing Restrictions: Yes LLE Weight Bearing: Non weight bearing      Mobility Bed Mobility Overal bed mobility: Needs Assistance Bed Mobility: Rolling, Sidelying to Sit Rolling: Min guard Sidelying to sit: Min guard       General bed mobility comments: Pt wanting to try without assist; ultimately needed step-by-step cues and Mod assist for reciprocally scooting closer to EOB    Transfers Overall transfer level: Needs assistance Equipment used: Rolling walker (2 wheels) Transfers: Sit to/from Stand, Bed to chair/wheelchair/BSC Sit to Stand: Mod assist Stand pivot transfers: +2 safety/equipment, +2 physical assistance, Mod assist         General transfer comment: Initial stand from EOB with mod assist, and pt needed to sit back down when it came time to reach for RW; Adjusted hand placement, and had smoother rise to fully standing with Mod assist; difficulty with shifting to pivot to recliner (on her Right); opted to sit back down and assist pt bed to recliner with closer assist (and +2 assist for safety); Performed stand pivot transfer bed to recliner with intact R knee blocked for stability; pt had difficulty letting go of armrest of chair and reaching for far armrest; notably fearful of falling      Balance Overall balance assessment: Needs assistance Sitting-balance support: Feet supported Sitting balance-Leahy Scale: Fair     Standing balance support: Bilateral upper extremity supported, During functional activity Standing balance-Leahy Scale: Poor  ADL either performed or assessed with clinical judgement   ADL Overall ADL's : Needs assistance/impaired Eating/Feeding: Set up;Sitting   Grooming: Set up;Supervision/safety;Sitting   Upper Body Bathing: Supervision/ safety;Set up;Sitting    Lower Body Bathing: Moderate assistance;Sit to/from stand   Upper Body Dressing : Supervision/safety;Set up;Sitting   Lower Body Dressing: Maximal assistance;Sit to/from stand   Toilet Transfer: Maximal assistance;Stand-pivot;+2 for safety/equipment (simulated to recliner)           Functional mobility during ADLs: Maximal assistance;+2 for safety/equipment (stand pivot)       Vision Baseline Vision/History: 1 Wears glasses       Perception     Praxis      Pertinent Vitals/Pain Pain Assessment Pain Assessment: Faces Faces Pain Scale: Hurts little more Pain Intervention(s): Monitored during session, Limited activity within patient's tolerance, Repositioned     Hand Dominance Left   Extremity/Trunk Assessment Upper Extremity Assessment Upper Extremity Assessment: Overall WFL for tasks assessed   Lower Extremity Assessment Lower Extremity Assessment: Generalized weakness;RLE deficits/detail LLE Deficits / Details: s/p BKA   Cervical / Trunk Assessment Cervical / Trunk Assessment: Kyphotic   Communication Communication Communication: HOH   Cognition Arousal/Alertness: Awake/alert Behavior During Therapy: WFL for tasks assessed/performed Overall Cognitive Status: Within Functional Limits for tasks assessed                                 General Comments: Oriented to self, place, date, and situation. Repeating certain information and requiring increased time for processing.     General Comments  Initial BP taken close to 7am 160/74; reported feeling dizzy EOB and BP 118/48; Post standing trials and transfer bed to recliner 102/57; foot board up in recliner 90/47; 5 minutes later, pt reports is comfortable in recliner 109/46; after approx 20 minutes in recliner OOB, BP 119/57    Exercises     Shoulder Instructions      Home Living Family/patient expects to be discharged to:: Private residence Living Arrangements: Spouse/significant  other Available Help at Discharge: Family;Available 24 hours/day Type of Home: Apartment Home Access: Level entry     Home Layout: One level     Bathroom Shower/Tub: Chief Strategy Officer: Standard Bathroom Accessibility: Yes   Home Equipment: Shower seat;Wheelchair Financial trader (4 wheels);Rolling Walker (2 wheels);Cane - single point;Grab bars - toilet;BSC/3in1;Grab bars - tub/shower          Prior Functioning/Environment Prior Level of Function : Needs assist       Physical Assist : Mobility (physical) Mobility (physical): Gait;Transfers     ADLs Comments: Pt reporting he was performing BADLs.        OT Problem List: Decreased strength;Decreased range of motion;Decreased activity tolerance;Impaired balance (sitting and/or standing);Decreased knowledge of use of DME or AE;Decreased knowledge of precautions;Pain      OT Treatment/Interventions: Therapeutic exercise;Self-care/ADL training;Energy conservation;DME and/or AE instruction;Therapeutic activities;Patient/family education    OT Goals(Current goals can be found in the care plan section) Acute Rehab OT Goals Patient Stated Goal: "I am wanting to get some rehab before returning home." OT Goal Formulation: With patient Time For Goal Achievement: 01/19/23 Potential to Achieve Goals: Good ADL Goals Pt Will Perform Upper Body Dressing: with set-up;with supervision;sitting Pt Will Perform Lower Body Dressing: with min guard assist;sitting/lateral leans;bed level Pt Will Transfer to Toilet: with min assist;with transfer board;stand pivot transfer;bedside commode Pt Will Perform Toileting - Clothing Manipulation and hygiene: with supervision;sitting/lateral  leans  OT Frequency: Min 3X/week    Co-evaluation              AM-PAC OT "6 Clicks" Daily Activity     Outcome Measure Help from another person eating meals?: None Help from another person taking care of personal grooming?: A Little Help  from another person toileting, which includes using toliet, bedpan, or urinal?: A Little Help from another person bathing (including washing, rinsing, drying)?: A Lot Help from another person to put on and taking off regular upper body clothing?: A Little Help from another person to put on and taking off regular lower body clothing?: A Lot 6 Click Score: 17   End of Session Equipment Utilized During Treatment: Rolling walker (2 wheels);Gait belt Nurse Communication: Mobility status  Activity Tolerance: Patient tolerated treatment well Patient left: in chair;with call bell/phone within reach;with chair alarm set  OT Visit Diagnosis: Unsteadiness on feet (R26.81);Other abnormalities of gait and mobility (R26.89);Muscle weakness (generalized) (M62.81);Pain Pain - part of body: Leg                Time: 1610-9604 OT Time Calculation (min): 39 min Charges:  OT General Charges $OT Visit: 1 Visit OT Evaluation $OT Eval Moderate Complexity: 1 Mod OT Treatments $Self Care/Home Management : 8-22 mins  Alaster Asfaw MSOT, OTR/L Acute Rehab Office: [phone]  Theodoro Grist Jannine Abreu 01/05/2023, 10:38 AM

## 2023-01-05 NOTE — Progress Notes (Signed)
Physical Therapy Treatment Patient Details Name: Cassandra [name] MRN: [id number] DOB: August 09, 1933 Today's Date: 01/05/2023   History of Present Illness 89 yo female with onset of failed management of osteomyelitis was admitted 5/3 and had BKA on 5/4.  PMHx:  T2DM, CAD, sleep apnea, hx of breast cancer, hx of MI, neuromuscular disorder, COPD, hx of visual hallucinations, CKD, HTN    PT Comments    Continuing work on functional mobility and activity tolerance;  Session focused on transfer training, balance in sitting and during standing trials; Stood twice from EOB, more stable and smooth rise second trial; Pt is notably fearful of falling, and today she benefitted form 2 person assist for safety, and to boost confidence and therefore activity tolerance; Patient will benefit from intensive inpatient follow up therapy, >3 hours/day    Recommendations for follow up therapy are one component of a multi-disciplinary discharge planning process, led by the attending physician.  Recommendations may be updated based on patient status, additional functional criteria and insurance authorization.  Follow Up Recommendations       Assistance Recommended at Discharge Frequent or constant Supervision/Assistance  Patient can return home with the following A little help with walking and/or transfers;A little help with bathing/dressing/bathroom;Assistance with cooking/housework;Assist for transportation;Help with stairs or ramp for entrance   Equipment Recommendations  Wheelchair (measurements PT);Wheelchair cushion (measurements PT);Other (comment) (can consider DROP-ARM Bsc)    Recommendations for Other Services Rehab consult     Precautions / Restrictions Precautions Precautions: Fall Precaution Comments: Wathc BP     Mobility  Bed Mobility     Rolling: Min guard Sidelying to sit: Min guard       General bed mobility comments: Pt wanting to try without assist; ultimately needed step-by-step  cues and Mod assist for reciprocally scooting closer to EOB    Transfers     Transfers: Sit to/from Stand, Bed to chair/wheelchair/BSC Sit to Stand: Mod assist Stand pivot transfers: +2 safety/equipment, +2 physical assistance, Mod assist         General transfer comment: Initial stand from EOB with mod assist, and pt needed to sit back down when it came time to reach for RW; Adjusted hand placement, and had smoother rise to fully standing with Mod assist; difficulty with shifting to pivot to recliner (on her Right); opted to sit back down and assist pt bed to recliner with closer assist (and +2 assist for safety); Performed stand pivot transfer bed to recliner with intact R knee blocked for stability; pt had difficulty letting go of armrest of chair and reaching for far armrest; notably fearful of falling    Ambulation/Gait                   Stairs             Wheelchair Mobility    Modified Rankin (Stroke Patients Only)       Balance                                            Cognition                                                Exercises      General Comments General  comments (skin integrity, edema, etc.): Initial BP taken close to 7am 160/74; reported feeling dizzy EOB and BP 118/48; Post standing trials and transfer bed to recliner 102/57; foot board up in recliner 90/47; 5 minutes later, pt reports is comfortable in recliner 109/46; after approx 20 minutes in recliner OOB, BP 119/57      Pertinent Vitals/Pain      Home Living Family/patient expects to be discharged to:: Private residence Living Arrangements: Spouse/significant other Available Help at Discharge: Family;Available 24 hours/day Type of Home: Apartment Home Access: Level entry       Home Layout: One level Home Equipment: Shower seat;Wheelchair Financial trader (4 wheels);Rolling Walker (2 wheels);Cane - single point;Grab bars -  toilet;BSC/3in1;Grab bars - tub/shower      Prior Function            PT Goals (current goals can now be found in the care plan section) Acute Rehab PT Goals Patient Stated Goal: to go home and be independent to walk again PT Goal Formulation: With patient Time For Goal Achievement: 01/18/23 Potential to Achieve Goals: Good Progress towards PT goals: Progressing toward goals    Frequency    Min 3X/week      PT Plan Current plan remains appropriate;Equipment recommendations need to be updated    Co-evaluation              AM-PAC PT "6 Clicks" Mobility   Outcome Measure  Help needed turning from your back to your side while in a flat bed without using bedrails?: None Help needed moving from lying on your back to sitting on the side of a flat bed without using bedrails?: A Little Help needed moving to and from a bed to a chair (including a wheelchair)?: A Lot Help needed standing up from a chair using your arms (e.g., wheelchair or bedside chair)?: A Lot Help needed to walk in hospital room?: Total Help needed climbing 3-5 steps with a railing? : Total 6 Click Score: 13    End of Session Equipment Utilized During Treatment: Gait belt Activity Tolerance: Patient tolerated treatment well;Other (comment) (BP low durin gsession compared to earlier value approx an hour prior to session) Patient left: in chair;with call bell/phone within reach;with chair alarm set Nurse Communication: Mobility status PT Visit Diagnosis: Unsteadiness on feet (R26.81);Muscle weakness (generalized) (M62.81);Difficulty in walking, not elsewhere classified (R26.2)     Time: 0454-0981 PT Time Calculation (min) (ACUTE ONLY): 38 min  Charges:  $Therapeutic Activity: 8-22 mins                     Van Clines, PT  Acute Rehabilitation Services Office [phone]    Levi Aland 01/05/2023, 9:57 AM

## 2023-01-05 NOTE — Progress Notes (Signed)
Inpatient Rehab Admissions Coordinator Note:   Per therapy patient was screened for CIR candidacy by Macaiah Mangal Luvenia Starch, CCC-SLP. At this time, pt appears to be a potential candidate for CIR. I will place an order for rehab consult for full assessment, per our protocol.  Please contact me any with questions.Wolfgang Phoenix, MS, CCC-SLP Admissions Coordinator [phone] 01/05/23 5:41 PM

## 2023-01-05 NOTE — Progress Notes (Signed)
Physical Therapy Treatment Patient Details Name: Cassandra [name] MRN: [id number] DOB: 1933-04-02 Today's Date: 01/05/2023   History of Present Illness 89 yo female with onset of failed management of osteomyelitis was admitted 5/3 and had BKA on 5/4.  PMHx:  T2DM, CAD, sleep apnea, hx of breast cancer, hx of MI, neuromuscular disorder, COPD, hx of visual hallucinations, CKD, HTN    PT Comments    Continuing work on functional mobility and activity tolerance;  Arrived to assist pt back to bed, and opted to try the stedy to see about using it as an option for safe transfers; Overall the stedy was a safe way to assist pt in performing transfers; The front bar of the stedy seems to help ease pt's fear of falling   Recommendations for follow up therapy are one component of a multi-disciplinary discharge planning process, led by the attending physician.  Recommendations may be updated based on patient status, additional functional criteria and insurance authorization.  Follow Up Recommendations       Assistance Recommended at Discharge Frequent or constant Supervision/Assistance  Patient can return home with the following A little help with walking and/or transfers;A little help with bathing/dressing/bathroom;Assistance with cooking/housework;Assist for transportation;Help with stairs or ramp for entrance   Equipment Recommendations  Wheelchair (measurements PT);Wheelchair cushion (measurements PT);Other (comment) (can consider DROP-ARM Bsc)    Recommendations for Other Services       Precautions / Restrictions Precautions Precautions: Fall Precaution Comments: Wathc BP Restrictions Weight Bearing Restrictions: Yes LLE Weight Bearing: Non weight bearing     Mobility  Bed Mobility Overal bed mobility: Needs Assistance Bed Mobility: Sit to Supine Rolling: Min guard Sidelying to sit: Min guard   Sit to supine: Min guard   General bed mobility comments: Able to lift both LEs into  bed without pysical assist    Transfers Overall transfer level: Needs assistance Equipment used: Ambulation equipment used Transfers: Sit to/from Stand, Bed to chair/wheelchair/BSC Sit to Stand: Mod assist, +2 safety/equipment Stand pivot transfers: +2 safety/equipment, +2 physical assistance, Mod assist         General transfer comment: Stood to stedy with mod assist Transfer via Lift Equipment: Stedy  Ambulation/Gait                   Stairs             Wheelchair Mobility    Modified Rankin (Stroke Patients Only)       Balance     Sitting balance-Leahy Scale: Fair       Standing balance-Leahy Scale: Poor                              Cognition Arousal/Alertness: Awake/alert Behavior During Therapy: WFL for tasks assessed/performed Overall Cognitive Status: Within Functional Limits for tasks assessed                                 General Comments: Oriented to self, place, date, and situation. Repeating certain information and requiring increased time for processing.        Exercises      General Comments General comments (skin integrity, edema, etc.): No dizziness or syncopal symptoms reported      Pertinent Vitals/Pain Pain Assessment Pain Assessment: Faces Faces Pain Scale: Hurts little more Pain Location: L residual limb Pain Descriptors / Indicators: Grimacing, Guarding Pain Intervention(s): Monitored during  session    Home Living Family/patient expects to be discharged to:: Private residence Living Arrangements: Spouse/significant other Available Help at Discharge: Family;Available 24 hours/day Type of Home: Apartment Home Access: Level entry       Home Layout: One level Home Equipment: Shower seat;Wheelchair Financial trader (4 wheels);Rolling Walker (2 wheels);Cane - single point;Grab bars - toilet;BSC/3in1;Grab bars - tub/shower      Prior Function            PT Goals (current goals  can now be found in the care plan section) Acute Rehab PT Goals Patient Stated Goal: to go home and be independent to walk again PT Goal Formulation: With patient Time For Goal Achievement: 01/18/23 Potential to Achieve Goals: Good Progress towards PT goals: Progressing toward goals    Frequency    Min 3X/week      PT Plan Current plan remains appropriate;Equipment recommendations need to be updated    Co-evaluation              AM-PAC PT "6 Clicks" Mobility   Outcome Measure  Help needed turning from your back to your side while in a flat bed without using bedrails?: None Help needed moving from lying on your back to sitting on the side of a flat bed without using bedrails?: A Little Help needed moving to and from a bed to a chair (including a wheelchair)?: A Lot Help needed standing up from a chair using your arms (e.g., wheelchair or bedside chair)?: A Lot Help needed to walk in hospital room?: Total Help needed climbing 3-5 steps with a railing? : Total 6 Click Score: 13    End of Session Equipment Utilized During Treatment: Gait belt Activity Tolerance: Patient tolerated treatment well Patient left: in bed;with call bell/phone within reach;with bed alarm set Nurse Communication: Mobility status PT Visit Diagnosis: Unsteadiness on feet (R26.81);Muscle weakness (generalized) (M62.81);Difficulty in walking, not elsewhere classified (R26.2)     Time: 7829-5621 PT Time Calculation (min) (ACUTE ONLY): 16 min  Charges:  $Therapeutic Activity: 8-22 mins                     Van Clines, PT  Acute Rehabilitation Services Office [phone]    Levi Aland 01/05/2023, 1:02 PM

## 2023-01-05 NOTE — Progress Notes (Signed)
    Subjective: Patient reports pain as moderate.  Tolerating diet.  Urinating.   No CP, SOB.  Has mobilized some OOB with PT.  Objective:   VITALS:   Vitals:   01/05/23 0021 01/05/23 0146 01/05/23 0545 01/05/23 0804  BP: (!) 222/62 (!) 187/62 (!) 143/59 (!) 160/74  Pulse:    87  Resp: 15  13 14   Temp: 98.5 F (36.9 C)   98.9 F (37.2 C)  TempSrc: Oral   Oral  SpO2: 95%   96%  Weight:      Height:          Latest Ref Rng & Units 01/05/2023    2:14 AM 01/04/2023    1:43 AM 12/12/2022    4:31 PM  CBC  WBC 4.0 - 10.5 K/uL 14.8  13.0  9.4   Hemoglobin 12.0 - 15.0 g/dL 8.9  8.7  16.1   Hematocrit 36.0 - 46.0 % 28.4  26.5  34.1   Platelets 150 - 400 K/uL 287  284  296       Latest Ref Rng & Units 01/05/2023    2:14 AM 01/04/2023    9:04 AM 01/04/2023    1:43 AM  BMP  Glucose 70 - 99 mg/dL 096   045   BUN 8 - 23 mg/dL 39   40   Creatinine 4.09 - 1.00 mg/dL 8.11   9.14   Sodium 782 - 145 mmol/L 135   135   Potassium 3.5 - 5.1 mmol/L 5.0  5.3  5.2   Chloride 98 - 111 mmol/L 104   104   CO2 22 - 32 mmol/L 24   23   Calcium 8.9 - 10.3 mg/dL 8.7   8.5    Intake/Output      05/04 0701 05/05 0700 05/05 0701 05/06 0700   P.O. 720    I.V. (mL/kg)     IV Piggyback     Total Intake(mL/kg) 720 (12.2)    Urine (mL/kg/hr) 1750 (1.2) 500 (1.6)   Blood     Total Output 1750 500   Net -1030 -500        Urine Occurrence 1 x       Physical Exam: General: NAD.  Sitting up in bedside chair, calm, sleepy Resp: No increased wob Cardio: regular rate and rhythm ABD soft Neurologically intact MSK Neurovascularly intact Hip flexion intact Incision: dressing C/D/I   Assessment: 2 Days Post-Op  S/P Procedure(s) (LRB): AMPUTATION BELOW KNEE (Left) by Dr. Jena Gauss on 01/03/23  Principal Problem:   Subacute osteomyelitis of ankle (HCC)   Plan: Hgb 8.9 today  Advance diet Up with therapy Incentive Spirometry Elevate and Apply ice  Weightbearing: NWB LLE Insicional and dressing  care: Reinforce dressings as needed Orthopedic device(s): None Showering: Keep dressing dry VTE prophylaxis:  Plavix has been restarted but not Eliquis  , SCDs, ambulation Pain control: PRN Follow - up plan:  with Dr. Jena Gauss   Dispo:  PT/OT recommending CIR     Jenne Pane, PA-C Office [phone] 01/05/2023, 12:28 PM

## 2023-01-06 ENCOUNTER — Encounter (HOSPITAL_COMMUNITY): Payer: Self-pay | Admitting: Student

## 2023-01-06 DIAGNOSIS — G8918 Other acute postprocedural pain: Secondary | ICD-10-CM

## 2023-01-06 DIAGNOSIS — R1312 Dysphagia, oropharyngeal phase: Secondary | ICD-10-CM | POA: Diagnosis not present

## 2023-01-06 DIAGNOSIS — M86272 Subacute osteomyelitis, left ankle and foot: Secondary | ICD-10-CM

## 2023-01-06 LAB — CBC
HCT: 30.1 % — ABNORMAL LOW (ref 36.0–46.0)
Hemoglobin: 9.7 g/dL — ABNORMAL LOW (ref 12.0–15.0)
MCH: 28.3 pg (ref 26.0–34.0)
MCHC: 32.2 g/dL (ref 30.0–36.0)
MCV: 87.8 fL (ref 80.0–100.0)
Platelets: 312 10*3/uL (ref 150–400)
RBC: 3.43 MIL/uL — ABNORMAL LOW (ref 3.87–5.11)
RDW: 18.4 % — ABNORMAL HIGH (ref 11.5–15.5)
WBC: 13.6 10*3/uL — ABNORMAL HIGH (ref 4.0–10.5)
nRBC: 0 % (ref 0.0–0.2)

## 2023-01-06 LAB — GLUCOSE, CAPILLARY
Glucose-Capillary: 136 mg/dL — ABNORMAL HIGH (ref 70–99)
Glucose-Capillary: 144 mg/dL — ABNORMAL HIGH (ref 70–99)
Glucose-Capillary: 174 mg/dL — ABNORMAL HIGH (ref 70–99)
Glucose-Capillary: 147 mg/dL — ABNORMAL HIGH (ref 70–99)

## 2023-01-06 LAB — VITAMIN D 25 HYDROXY (VIT D DEFICIENCY, FRACTURES): Vit D, 25-Hydroxy: 39.47 ng/mL (ref 30–100)

## 2023-01-06 LAB — SURGICAL PATHOLOGY

## 2023-01-06 MED ORDER — APIXABAN 5 MG PO TABS
5.0000 mg | ORAL_TABLET | Freq: Two times a day (BID) | ORAL | Status: DC
  Administered 2023-01-06 – 2023-01-13 (×14): 5 mg via ORAL
  Filled 2023-01-06 (×14): qty 1

## 2023-01-06 NOTE — Care Management Important Message (Signed)
Important Message  Patient Details  Name: Cassandra [name] MRN: [id number] Date of Birth: 12/16/1932   Medicare Important Message Given:  Yes     Sherilyn Banker 01/06/2023, 1:27 PM

## 2023-01-06 NOTE — Progress Notes (Signed)
Speech Language Pathology Treatment: Dysphagia  Patient Details Name: Cassandra [name] MRN: [id number] DOB: [birth year] Today's Date: 01/06/2023 Time: 4098-1191 SLP Time Calculation (min) (ACUTE ONLY): 12 min  Assessment / Plan / Recommendation Clinical Impression  Pt is tolerating regular diet, thin liquids well. She has been told her GI physician to avoid straws; she believes straws were the source of coughing with liquids.  She shows no s/s of dysphagia nor aspiration per my observation today.  She has not had pna in the recent past. If she has occasional penetration or even aspiration, it has not led to adverse consequences and her swallowing is likely functional. There are no further acute care SLP needs. Our service will s/o. D/W Pt and her husband.   HPI HPI: 88 yo female with onset of failed management of osteomyelitis was admitted 5/3 and had BKA on 5/4.  PMHx:  T2DM, CAD, sleep apnea, hx of breast cancer, hx of MI, neuromuscular disorder, COPD, hx of visual hallucinations, CKD, HTN. Pt completed an MBS on 08/16/22 with laryngeal penetration of thin liquid and recommendations for regular solids and thin liquids with no straws.      SLP Plan  All goals met      Recommendations for follow up therapy are one component of a multi-disciplinary discharge planning process, led by the attending physician.  Recommendations may be updated based on patient status, additional functional criteria and insurance authorization.    Recommendations  Diet recommendations: Regular;Thin liquid Liquids provided via: Cup Medication Administration: Whole meds with liquid Supervision: Patient able to self feed                  Oral care BID     Dysphagia, oropharyngeal phase (R13.12)     All goals met    Cassandra Reitan L. Samson Frederic, MA CCC/SLP Clinical Specialist - Acute Care SLP Acute Rehabilitation Services Office number [phone]  Cassandra [name]  01/06/2023, 2:33 PM

## 2023-01-06 NOTE — Progress Notes (Signed)
Physical Therapy Treatment Patient Details Name: Cassandra [name] MRN: [id number] DOB: 07-20-1933 Today's Date: 01/06/2023   History of Present Illness 89 yo female with onset of failed management of osteomyelitis was admitted 5/3 and had BKA on 5/4.  PMHx:  T2DM, CAD, sleep apnea, hx of breast cancer, hx of MI, neuromuscular disorder, COPD, hx of visual hallucinations, CKD, HTN    PT Comments    Pt was seen for mobility on RW initially with pt being concerned that she can pivot on RLE to chair.  Instead directly assisted her to pivot as she was tired from company today.  Positioned with L knee straight, elevation of LLE and had all devices in place.  Follow along with her to work on strengthening and control of standing balance, as well as prepare for rehab transition.  Pt was thinking she would be healed enough to get a prosthesis by the end of stay in rehab, and discussed the logistics of the recovery process after that.  Follow acutely for goals of PT and work on mobility as tolerated.   Recommendations for follow up therapy are one component of a multi-disciplinary discharge planning process, led by the attending physician.  Recommendations may be updated based on patient status, additional functional criteria and insurance authorization.  Follow Up Recommendations       Assistance Recommended at Discharge Frequent or constant Supervision/Assistance  Patient can return home with the following A little help with walking and/or transfers;A little help with bathing/dressing/bathroom;Assistance with cooking/housework;Assist for transportation;Help with stairs or ramp for entrance   Equipment Recommendations  Wheelchair (measurements PT);Wheelchair cushion (measurements PT);Other (comment)    Recommendations for Other Services Rehab consult     Precautions / Restrictions Precautions Precautions: Fall Precaution Comments: watch BP Restrictions Weight Bearing Restrictions: Yes LLE  Weight Bearing: Non weight bearing     Mobility  Bed Mobility Overal bed mobility: Needs Assistance Bed Mobility: Supine to Sit     Supine to sit: Min assist, Min guard     General bed mobility comments: min guard x min assist to steady sitting balance    Transfers Overall transfer level: Needs assistance Equipment used: Rolling walker (2 wheels) Transfers: Sit to/from Stand, Bed to chair/wheelchair/BSC Sit to Stand: Mod assist Stand pivot transfers: Mod assist         General transfer comment: directly assisted pt to chair as she is tired, had company for a length of time today.  Mod assist with no signs of RLE giving out, and was controlled to sit from standing on walker but fearful    Ambulation/Gait               General Gait Details: deferred   Stairs             Wheelchair Mobility    Modified Rankin (Stroke Patients Only)       Balance Overall balance assessment: Needs assistance Sitting-balance support: Single extremity supported, Bilateral upper extremity supported Sitting balance-Leahy Scale: Fair     Standing balance support: Bilateral upper extremity supported, During functional activity Standing balance-Leahy Scale: Poor                              Cognition Arousal/Alertness: Awake/alert Behavior During Therapy: WFL for tasks assessed/performed Overall Cognitive Status: Within Functional Limits for tasks assessed  Exercises      General Comments General comments (skin integrity, edema, etc.): Pt was assisted to side of bed with no hypotension and not symptomatic.  Stands readily but does not feel she can use RLE to support her pivot to chair      Pertinent Vitals/Pain Pain Assessment Pain Assessment: No/denies pain    Home Living                          Prior Function            PT Goals (current goals can now be found in the care  plan section) Acute Rehab PT Goals Patient Stated Goal: to go home and be independent to walk again Progress towards PT goals: Progressing toward goals    Frequency    Min 3X/week      PT Plan Current plan remains appropriate    Co-evaluation              AM-PAC PT "6 Clicks" Mobility   Outcome Measure  Help needed turning from your back to your side while in a flat bed without using bedrails?: None Help needed moving from lying on your back to sitting on the side of a flat bed without using bedrails?: A Lot Help needed moving to and from a bed to a chair (including a wheelchair)?: A Lot Help needed standing up from a chair using your arms (e.g., wheelchair or bedside chair)?: A Lot Help needed to walk in hospital room?: Total Help needed climbing 3-5 steps with a railing? : Total 6 Click Score: 12    End of Session Equipment Utilized During Treatment: Gait belt Activity Tolerance: Patient limited by fatigue;Treatment limited secondary to medical complications (Comment) Patient left: in chair;with call bell/phone within reach;with chair alarm set;with nursing/sitter in room Nurse Communication: Mobility status PT Visit Diagnosis: Unsteadiness on feet (R26.81);Muscle weakness (generalized) (M62.81);Difficulty in walking, not elsewhere classified (R26.2)     Time: 0865-7846 PT Time Calculation (min) (ACUTE ONLY): 18 min  Charges:  $Therapeutic Activity: 8-22 mins           Ivar Drape 01/06/2023, 3:11 PM  Samul Dada, PT PhD Acute Rehab Dept. Number: [facility] [phone] and [facility] [phone]

## 2023-01-06 NOTE — Progress Notes (Signed)
Inpatient Rehab Coordinator Note:  I met with patient, her spouse, and her granddaughter, at bedside to discuss CIR recommendations and goals/expectations of CIR stay.  We reviewed 3 hrs/day of therapy, physician follow up, and average length of stay 2 weeks (dependent upon progress) with goals of supervision to min assist.  Spouse confirms he can provide needed assist at discharge.  We discussed insurance auth process and I will start that today.  Will follow for determination.   Estill Dooms, PT, DPT Admissions Coordinator [phone] 01/06/23  3:02 PM

## 2023-01-06 NOTE — Progress Notes (Signed)
Orthopaedic Trauma Progress Note  SUBJECTIVE: Patient doing fairly well this morning.  Had some pain over the weekend but this is improving today.  States she has come to terms with her decision for amputation.  She feels that she may need the right decision.  Still having some phantom limb pain.  No chest pain. No SOB. No nausea/vomiting. No other complaints.  Was able to stand and transfer to bedside chair with therapies over the weekend.  Felt good about this.  OBJECTIVE:  Vitals:   01/06/23 1156 01/06/23 1321  BP: (!) 138/59 (!) 109/37  Pulse: 82 65  Resp:  18  Temp:  98 F (36.7 C)  SpO2:      General: Sitting up in bed, no acute distress.  Pleasant and cooperative Respiratory: No increased work of breathing.  Left lower extremity: Dressing removed, incision clean, dry, intact.  Wound edges well-approximated.  Hypersensitive around stump as expected.  No tenderness throughout the thigh.  Able to straight leg raise.  LABS:  Results for orders placed or performed during the hospital encounter of 01/03/23 (from the past 24 hour(s))  Glucose, capillary     Status: Abnormal   Collection Time: 01/05/23  4:32 PM  Result Value Ref Range   Glucose-Capillary 146 (H) 70 - 99 mg/dL  Glucose, capillary     Status: Abnormal   Collection Time: 01/05/23  9:42 PM  Result Value Ref Range   Glucose-Capillary 115 (H) 70 - 99 mg/dL  Glucose, capillary     Status: Abnormal   Collection Time: 01/06/23  7:35 AM  Result Value Ref Range   Glucose-Capillary 136 (H) 70 - 99 mg/dL  CBC     Status: Abnormal   Collection Time: 01/06/23  9:40 AM  Result Value Ref Range   WBC 13.6 (H) 4.0 - 10.5 K/uL   RBC 3.43 (L) 3.87 - 5.11 MIL/uL   Hemoglobin 9.7 (L) 12.0 - 15.0 g/dL   HCT 16.1 (L) 09.6 - 04.5 %   MCV 87.8 80.0 - 100.0 fL   MCH 28.3 26.0 - 34.0 pg   MCHC 32.2 30.0 - 36.0 g/dL   RDW 40.9 (H) 81.1 - 91.4 %   Platelets 312 150 - 400 K/uL   nRBC 0.0 0.0 - 0.2 %  Glucose, capillary     Status:  Abnormal   Collection Time: 01/06/23 11:21 AM  Result Value Ref Range   Glucose-Capillary 144 (H) 70 - 99 mg/dL    ASSESSMENT: Cassandra [name] is a 89 y.o. female, 3 Days Post-Op s/p LEFT AMPUTATION BELOW KNEE  CV/Blood loss: Acute blood loss anemia, Hgb 9.7 this morning.  Trending up from yesterday. Hemodynamically stable  PLAN: Weightbearing: NWB LLE ROM: Okay for hip and knee range of motion as tolerated Incisional and dressing care: Okay to leave incision open to air.  Change dressing as needed Showering: Okay to allow incision to get wet when showering Orthopedic device(s): Stump shrinker LLE Pain management:  1. Tylenol 325-650 mg q 6 hours PRN 2. Robaxin 500 mg q 6 hours PRN 3. Norco 5-325 OR 7.5-325 mg q 4 hours PRN VTE prophylaxis:  Eliquis and Plavix , SCDs ID:  Ancef 2gm post op completed Foley/Lines:  No foley, KVO IVFs Impediments to Fracture Healing: Vitamin D level pending, will start supplementation as indicated Dispo: PT/OT evaluation ongoing, currently recommending CIR.  Okay for discharge once bed available.  Patient will require suture removal LLE around 01/17/2023  D/C recommendations: -Norco 5-325 mg for  pain control -Continue home dose Plavix and Eliquis for DVT prophylaxis -Possible need for Vit D supplementation  Follow - up plan: 2 weeks   Contact information:  Truitt Merle MD, Thyra Breed PA-C. After hours and holidays please check [email or web address] for group call information for [facility]   Thompson Caul, PA-C [phone] (office) [email or web address]

## 2023-01-06 NOTE — Progress Notes (Signed)
MEDICATION-RELATED CONSULT NOTE   Ortho Procedure Consult  Procedure: L leg BKA    Completed: 01/03/23  Antithrombotic medications on inpatient or PTA profile prior to procedure:   clopidogrel 75mg  daily and apixaban 5mg  BID  Recommended restart time per ortho: POD1 if hgb >/= 9    Considerations: hemoglobin POD3 9.7  Plan:     Plavix was resumed 5/5 Resume apixaban 5mg  BID today since hgb >/= 9 per consult  Rexford Maus, PharmD, BCPS 01/06/2023 10:50 AM

## 2023-01-06 NOTE — Consult Note (Signed)
Physical Medicine and Rehabilitation Consult Reason for Consult:Left BKA, impaired functional mobility Referring Physician: Haddix   HPI: Cassandra [name] is a 89 y.o. female with a hx of DM, prior left tib-fib fx in 2022 which had multiple complications including infection requiring long-term abx. Pt continued to have significant pain and swelling in her left lower ext and ultimately opted for left BKA which was performed on 01/03/23 by Dr. Jena Gauss. Her course was complicated by suspected oropharyngeal dysphagia. SLP did not pick up any abnl at bedside but pt had deep laryngeal penetration of thins on recent esophagram. SLP recommended small bites, slow rate, upright seating for meals. Plavix resumed initially and then eliquis today has hgb up to 9.7.  WBC's remain 13.6 Pt was up with PT yesterday and was mod assist for sit-std transfers. PT noted fear of falling during transfers and that the right knee was buckling.    Review of Systems  Constitutional:  Negative for fever.  HENT:  Negative for ear pain.   Eyes:  Negative for blurred vision.  Respiratory:  Negative for cough.   Cardiovascular:  Negative for chest pain.  Gastrointestinal:  Negative for heartburn.  Genitourinary:  Negative for dysuria.  Musculoskeletal:  Positive for myalgias.  Skin:  Negative for rash.  Neurological:  Positive for sensory change and focal weakness.  Psychiatric/Behavioral:  Negative for depression.    Past Medical History:  Diagnosis Date   Acquired hypothyroidism 01/06/2014   Anxiety    Arthritis    B12 deficiency 02/15/2014   Benign essential hypertension 01/06/2014   Breast cancer (HCC) 1982   Right breast cancer - chemotherapy   CAD (coronary artery disease), native coronary artery 01/06/2014   Carotid artery calcification    Chronic airway obstruction, not elsewhere classified 01/06/2014   Chronic mesenteric ischemia (HCC)    s/p SMA stent 11/03/19   Depression    Diabetes mellitus  without complication (HCC)    Dysrhythmia    aflutter with RVR 06/2016   History of kidney stones 2019   left ureteral stone   Incontinence of urine    Myocardial infarction (HCC) 1982   small infarct   Neuromuscular disorder (HCC)    restless legs   Pacemaker    Presence of permanent cardiac pacemaker 07/2016   Pure hypercholesterolemia 01/06/2014   Renal insufficiency    Skin cancer    Sleep apnea    no longer uses a cpap. Uses oxygen as needed   Past Surgical History:  Procedure Laterality Date   AMPUTATION Left 01/03/2023   Procedure: AMPUTATION BELOW KNEE;  Surgeon: Roby Lofts, MD;  Location: [facility];  Service: Orthopedics;  Laterality: Left;   ANKLE FUSION Left 10/10/2021   Procedure: ANKLE FUSION;  Surgeon: Roby Lofts, MD;  Location: [facility];  Service: Orthopedics;  Laterality: Left;   APPENDECTOMY     APPLICATION OF WOUND VAC Left 02/24/2021   Procedure: APPLICATION OF WOUND VAC;  Surgeon: Samson Frederic, MD;  Location: [facility];  Service: Orthopedics;  Laterality: Left;   AUGMENTATION MAMMAPLASTY Bilateral 1982/redo in 2014   prior mastectomy   CARDIAC CATHETERIZATION N/A 04/23/2016   Procedure: Left Heart Cath and Coronary Angiography;  Surgeon: Lamar Blinks, MD;  Location: [facility];  Service: Cardiovascular;  Laterality: N/A;   CARDIAC CATHETERIZATION Left 04/23/2016   Procedure: Coronary Stent Intervention;  Surgeon: Alwyn Pea, MD;  Location: [facility];  Service: Cardiovascular;  Laterality: Left;  CAROTID ARTERY ANGIOPLASTY Left 2010   had stent inserted and removed d/t infection. artery from leg inserted in left carotid   CAROTID ENDARTERECTOMY Left 2010   left CEA ~ 2010, s/p excision infected left carotid patch & pseudoaneurysm with left CCA-ICA bypass using SVG 05/20/12   CHOLECYSTECTOMY     CORONARY ANGIOPLASTY WITH STENT PLACEMENT  september 9th 2017   CYSTOSCOPY W/ URETERAL STENT PLACEMENT Left 08/17/2017   Procedure:  CYSTOSCOPY WITH RETROGRADE PYELOGRAM/URETERAL STENT PLACEMENT;  Surgeon: Jerilee Field, MD;  Location: [facility];  Service: Urology;  Laterality: Left;   CYSTOSCOPY W/ URETERAL STENT PLACEMENT Left 09/16/2017   Procedure: CYSTOSCOPY WITH STENT REPLACEMENT;  Surgeon: [name], MD;  Location: [facility];  Service: Urology;  Laterality: Left;   CYSTOSCOPY/RETROGRADE/URETEROSCOPY/STONE EXTRACTION WITH BASKET Left 09/16/2017   Procedure: CYSTOSCOPY/RETROGRADE/URETEROSCOPY/STONE EXTRACTION WITH BASKET;  Surgeon: [name], MD;  Location: [facility];  Service: Urology;  Laterality: Left;   EXTERNAL FIXATION LEG Left 02/24/2021   Procedure: EXTERNAL FIXATION  ANKLE;  Surgeon: Samson Frederic, MD;  Location: [facility];  Service: Orthopedics;  Laterality: Left;   EXTERNAL FIXATION REMOVAL Left 02/26/2021   Procedure: REMOVAL EXTERNAL FIXATION LEG;  Surgeon: Roby Lofts, MD;  Location: [facility];  Service: Orthopedics;  Laterality: Left;   EYE SURGERY Bilateral    cataract extraction   I & D EXTREMITY Left 02/24/2021   Procedure: IRRIGATION AND DEBRIDEMENT EXTREMITY;  Surgeon: Samson Frederic, MD;  Location: [facility];  Service: Orthopedics;  Laterality: Left;   I & D EXTREMITY Left 12/21/2021   Procedure: IRRIGATION AND DEBRIDEMENT LEFT ANKLE;  Surgeon: Roby Lofts, MD;  Location: [facility];  Service: Orthopedics;  Laterality: Left;   I & D EXTREMITY Left 05/03/2022   Procedure: DEBRIDEMENT OF LEFT ANKLE INFECTION;  Surgeon: Roby Lofts, MD;  Location: [facility];  Service: Orthopedics;  Laterality: Left;   IR FLUORO GUIDE CV LINE LEFT  12/27/2021   IR FLUORO GUIDE CV LINE RIGHT  10/12/2021   IR REMOVAL TUN CV CATH W/O FL  11/22/2021   IR REMOVAL TUN CV CATH W/O FL  10/10/2022   IR US GUIDE VASC ACCESS LEFT  12/27/2021   IR US GUIDE VASC ACCESS RIGHT  10/12/2021   KYPHOPLASTY N/A 02/03/2018   Procedure: [id number];  Surgeon: Kennedy Bucker, MD;  Location: [facility];  Service: Orthopedics;  Laterality:  N/A;   MASTECTOMY Right 1982   ORIF ANKLE FRACTURE Left 02/26/2021   Procedure: OPEN REDUCTION INTERNAL FIXATION (ORIF) ANKLE FRACTURE;  Surgeon: Roby Lofts, MD;  Location: [facility];  Service: Orthopedics;  Laterality: Left;   PACEMAKER INSERTION Left 07/03/2016   Procedure: INSERTION PACEMAKER;  Surgeon: Marcina Millard, MD;  Location: [facility];  Service: Cardiovascular;  Laterality: Left;   SKIN CANCER EXCISION     TONSILLECTOMY     TUBAL LIGATION     VISCERAL ANGIOGRAPHY N/A 11/03/2019   Procedure: VISCERAL ANGIOGRAPHY;  Surgeon: Renford Dills, MD;  Location: [facility];  Service: Cardiovascular;  Laterality: N/A;   Family History  Problem Relation Age of Onset   Prostate cancer Neg Hx    Kidney cancer Neg Hx    Breast cancer Neg Hx    Social History:  reports that she quit smoking about 68 years ago. Her smoking use included cigarettes. She started smoking about 69 years ago. She smoked an average of .5 packs per day. She has been exposed to tobacco smoke. She has never used smokeless  tobacco. She reports current alcohol use. She reports that she does not currently use drugs. Allergies:  Allergies  Allergen Reactions   Baclofen Other (See Comments)    Hallucinations   Levofloxacin Other (See Comments)    Hallucinations    Medications Prior to Admission  Medication Sig Dispense Refill   acetaminophen (TYLENOL) 500 MG tablet Take 500 mg by mouth every 6 (six) hours as needed for moderate pain.     amitriptyline (ELAVIL) 10 MG tablet Take 10 mg by mouth at bedtime.     clopidogrel (PLAVIX) 75 MG tablet Take 75 mg by mouth daily.     desvenlafaxine (PRISTIQ) 50 MG 24 hr tablet Take 100 mg by mouth every evening.     ELIQUIS 5 MG TABS tablet Take 5 mg by mouth 2 (two) times daily.     hydrOXYzine (ATARAX) 10 MG tablet Take 1 tablet (10 mg total) by mouth 3 (three) times daily as needed. 30 tablet 0   insulin aspart (NOVOLOG FLEXPEN) 100 UNIT/ML FlexPen Hold  until followup with outpatient provider. (Patient taking differently: Inject 4 Units into the skin daily.) 15 mL 11   insulin glargine (LANTUS SOLOSTAR) 100 UNIT/ML Solostar Pen Hold until followup with outpatient provider. (Patient taking differently: Inject 6 Units into the skin at bedtime.) 15 mL 11   levothyroxine (SYNTHROID) 125 MCG tablet Take 125 mcg by mouth daily before breakfast.     Melatonin 10 MG CAPS Take 10 mg by mouth at bedtime as needed (sleep).     metFORMIN (GLUCOPHAGE) 500 MG tablet Take 500 mg by mouth 2 (two) times daily with a meal.     metoprolol tartrate (LOPRESSOR) 25 MG tablet Take 1 tablet (25 mg total) by mouth 2 (two) times daily. 60 tablet 2   Multiple Vitamin (MULTIVITAMIN WITH MINERALS) TABS tablet Take 1 tablet by mouth daily. 30 tablet 0   nitroGLYCERIN (NITROSTAT) 0.4 MG SL tablet Place 1 tablet (0.4 mg total) under the tongue every 5 (five) minutes as needed for chest pain. 90 tablet 0   potassium chloride (KLOR-CON M) 10 MEQ tablet Take 10 mEq by mouth 2 (two) times daily.     rosuvastatin (CRESTOR) 10 MG tablet Take 10 mg by mouth at bedtime.     telmisartan (MICARDIS) 40 MG tablet Take 40 mg by mouth daily.     torsemide (DEMADEX) 20 MG tablet Take 20 mg by mouth daily.     traZODone (DESYREL) 50 MG tablet Take 50 mg by mouth at bedtime.     VITAMIN D PO Take 1 capsule by mouth daily.     linezolid (ZYVOX) 600 MG tablet Take 1 tablet (600 mg total) by mouth daily. (Patient not taking: Reported on 12/30/2022) 30 tablet 5   [name] Tosylate 150 MG TABS Take 2 tablets (300 mg total) by mouth daily. (Patient not taking: Reported on 12/30/2022) 56 tablet 2    Home: Home Living Family/patient expects to be discharged to:: Private residence Living Arrangements: Spouse/significant other Available Help at Discharge: Family, Available 24 hours/day Type of Home: Apartment Home Access: Level entry Home Layout: One level Bathroom Shower/Tub: Teacher, music: Standard Bathroom Accessibility: Yes Home Equipment: Information systems manager, Wheelchair - manual, Rollator (4 wheels), Agricultural consultant (2 wheels), [employer] - single point, Grab bars - toilet, BSC/3in1, Grab bars - tub/shower Additional Comments: 4'11" requires youth walker  Functional History: Prior Function Prior Level of Function : Needs assist Physical Assist : Mobility (physical) Mobility (physical): Gait, Transfers  Mobility Comments: recent PT to start walking on RW and uses wc for community ADLs Comments: Pt reporting he was performing BADLs. Functional Status:  Mobility: Bed Mobility Overal bed mobility: Needs Assistance Bed Mobility: Sit to Supine Rolling: Min guard Sidelying to sit: Min guard Sit to supine: Min guard Sit to sidelying: Min guard General bed mobility comments: Able to lift both LEs into bed without pysical assist Transfers Overall transfer level: Needs assistance Equipment used: Ambulation equipment used Transfers: Sit to/from Stand, Bed to chair/wheelchair/BSC Sit to Stand: Mod assist, +2 safety/equipment Bed to/from chair/wheelchair/BSC transfer type:: Via Lift equipment Stand pivot transfers: +2 safety/equipment, +2 physical assistance, Mod assist Transfer via Lift Equipment: Stedy General transfer comment: Stood to stedy with mod assist Ambulation/Gait General Gait Details: attempted but cannot yet    ADL: ADL Overall ADL's : Needs assistance/impaired Eating/Feeding: Set up, Sitting Grooming: Set up, Supervision/safety, Sitting Upper Body Bathing: Supervision/ safety, Set up, Sitting Lower Body Bathing: Moderate assistance, Sit to/from stand Upper Body Dressing : Supervision/safety, Set up, Sitting Lower Body Dressing: Maximal assistance, Sit to/from stand Toilet Transfer: Maximal assistance, Stand-pivot, +2 for safety/equipment (simulated to recliner) Functional mobility during ADLs: Maximal assistance, +2 for safety/equipment (stand  pivot)  Cognition: Cognition Overall Cognitive Status: Within Functional Limits for tasks assessed Orientation Level: Oriented X4 Cognition Arousal/Alertness: Awake/alert Behavior During Therapy: WFL for tasks assessed/performed Overall Cognitive Status: Within Functional Limits for tasks assessed General Comments: Oriented to self, place, date, and situation. Repeating certain information and requiring increased time for processing.  Blood pressure (!) 138/59, pulse 82, temperature (!) 97.5 F (36.4 C), temperature source Oral, resp. rate 17, height 4\' 11"  (1.499 m), weight 59 kg, SpO2 (!) 87 %. Physical Exam Constitutional:      General: She is not in acute distress. HENT:     Head: Normocephalic.     Right Ear: External ear normal.     Left Ear: External ear normal.     Nose: Nose normal.     Mouth/Throat:     Mouth: Mucous membranes are moist.  Eyes:     Extraocular Movements: Extraocular movements intact.     Pupils: Pupils are equal, round, and reactive to light.  Cardiovascular:     Rate and Rhythm: Normal rate.     Pulses: Normal pulses.  Pulmonary:     Effort: Pulmonary effort is normal.  Abdominal:     Palpations: Abdomen is soft.  Musculoskeletal:     Cervical back: Normal range of motion.  Skin:    Comments: Left BK in shrinker. Chronic changes RLE.   Neurological:     General: No focal deficit present.     Mental Status: She is alert and oriented to person, place, and time.     Comments: Alert and oriented x 3. Normal insight and awareness. Intact Memory. Normal language and speech. Cranial nerve exam unremarkable. MMT: 4/5 UE. LLE limited by pain but 2-3/5. RLE 4/5 prox to distal. No focal sensory findings. .    Psychiatric:        Mood and Affect: Mood normal.        Behavior: Behavior normal.     Results for orders placed or performed during the hospital encounter of 01/03/23 (from the past 24 hour(s))  Glucose, capillary     Status: Abnormal    Collection Time: 01/05/23  4:32 PM  Result Value Ref Range   Glucose-Capillary 146 (H) 70 - 99 mg/dL  Glucose, capillary     Status:  Abnormal   Collection Time: 01/05/23  9:42 PM  Result Value Ref Range   Glucose-Capillary 115 (H) 70 - 99 mg/dL  Glucose, capillary     Status: Abnormal   Collection Time: 01/06/23  7:35 AM  Result Value Ref Range   Glucose-Capillary 136 (H) 70 - 99 mg/dL  CBC     Status: Abnormal   Collection Time: 01/06/23  9:40 AM  Result Value Ref Range   WBC 13.6 (H) 4.0 - 10.5 K/uL   RBC 3.43 (L) 3.87 - 5.11 MIL/uL   Hemoglobin 9.7 (L) 12.0 - 15.0 g/dL   HCT 16.1 (L) 09.6 - 04.5 %   MCV 87.8 80.0 - 100.0 fL   MCH 28.3 26.0 - 34.0 pg   MCHC 32.2 30.0 - 36.0 g/dL   RDW 40.9 (H) 81.1 - 91.4 %   Platelets 312 150 - 400 K/uL   nRBC 0.0 0.0 - 0.2 %  Glucose, capillary     Status: Abnormal   Collection Time: 01/06/23 11:21 AM  Result Value Ref Range   Glucose-Capillary 144 (H) 70 - 99 mg/dL   No results found.  Assessment/Plan: Diagnosis: 89 yo female with non-healing wound LLE s/p left BKA  Does the need for close, 24 hr/day medical supervision in concert with the patient's rehab needs make it unreasonable for this patient to be served in a less intensive setting? Yes Co-Morbidities requiring supervision/potential complications:  -post-op pain, wound care -leukocytosis -post-op anemia -chronic kidney disease -pre-prosthetic education -dysphagia Due to bladder management, does the patient require 24 hr/day rehab nursing? Yes Does the patient require coordinated care of a physician, rehab nurse, therapy disciplines of PT, OT to address physical and functional deficits in the context of the above medical diagnosis(es)? Yes Addressing deficits in the following areas: balance, endurance, locomotion, strength, transferring, bowel/bladder control, bathing, dressing, feeding, grooming, toileting, and psychosocial support Can the patient actively participate in an  intensive therapy program of at least 3 hrs of therapy per day at least 5 days per week? Yes The potential for patient to make measurable gains while on inpatient rehab is excellent Anticipated functional outcomes upon discharge from inpatient rehab are supervision and min assist  with PT, supervision and min assist with OT, n/a with SLP. Estimated rehab length of stay to reach the above functional goals is: 10-13 days Anticipated discharge destination: Home Overall Rehab/Functional Prognosis: excellent  POST ACUTE RECOMMENDATIONS: This patient's condition is appropriate for continued rehabilitative care in the following setting: CIR Patient has agreed to participate in recommended program. Yes Note that insurance prior authorization may be required for reimbursement for recommended care.  Comment:  Pt was independent with walker before this admission. Still dealing with a lot of pain but is motivated to return home with her husband who can rprovide her assistance.           I have personally performed a face to face diagnostic evaluation of this patient. Additionally, I have examined the patient's medical record including any pertinent labs and radiographic images. If the physician assistant has documented in this note, I have reviewed and edited or otherwise concur with the physician assistant's documentation.  Thanks,  Ranelle Oyster, MD 01/06/2023

## 2023-01-07 LAB — GLUCOSE, CAPILLARY
Glucose-Capillary: 99 mg/dL (ref 70–99)
Glucose-Capillary: 299 mg/dL — ABNORMAL HIGH (ref 70–99)
Glucose-Capillary: 90 mg/dL (ref 70–99)
Glucose-Capillary: 170 mg/dL — ABNORMAL HIGH (ref 70–99)

## 2023-01-07 NOTE — Progress Notes (Addendum)
Inpatient Rehab Admissions Coordinator:   Clinicals refaxed to [facility]) per their request.  Will follow.   1530: Confirmed clinicals were received.   Estill Dooms, PT, DPT Admissions Coordinator [phone] 01/07/23  1:57 PM
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
## 2023-01-07 NOTE — Progress Notes (Signed)
Occupational Therapy Treatment Patient Details Name: Cassandra [name] MRN: [id number] DOB: [birth year] Today's Date: 01/07/2023   History of present illness 89 yo female with onset of failed management of osteomyelitis was admitted 5/3 and had BKA on 5/4.  PMHx:  T2DM, CAD, sleep apnea, hx of breast cancer, hx of MI, neuromuscular disorder, COPD, hx of visual hallucinations, CKD, HTN   OT comments  Pt declined getting up to recliner this session, stating that she sat for too long yesterday and her butt got sore. Pt agreed to complete seated LE exercises. Session focused on residual limb exercises with pt education provided in order to achieve required ROM and strength in order to progress towards use of prothesis in the future. HEP handout provided. Hip stretch completed to address muscle soreness from sitting too long yesterday. Pt was able to verbalize stretch with relief. Pt continues to be appropriate for intensive inpatient follow up therapy, >3 hours/day. OT will continue to follow acutely.     Recommendations for follow up therapy are one component of a multi-disciplinary discharge planning process, led by the attending physician.  Recommendations may be updated based on patient status, additional functional criteria and insurance authorization.    Assistance Recommended at Discharge Frequent or constant Supervision/Assistance     Equipment Recommendations  None recommended by OT       Precautions / Restrictions Precautions Precautions: Fall Precaution Comments: watch BP Restrictions Weight Bearing Restrictions: Yes LLE Weight Bearing: Non weight bearing       Mobility Bed Mobility Overal bed mobility: Needs Assistance Bed Mobility: Supine to Sit, Sit to Supine     Supine to sit: Supervision, HOB elevated Sit to supine: Min guard   General bed mobility comments: VC for hand placement and use of bed rails. VC and visual demonstration on scoot hips forward. Pt able to boost  self up in bed using bilateral side rails. Pt provided with visual cues and verbal cues on technique. Patient Response: Cooperative  Transfers Overall transfer level:  (Pt declined transfer this session.)           Balance Overall balance assessment: Needs assistance Sitting-balance support: No upper extremity supported, Feet unsupported Sitting balance-Leahy Scale: Fair Sitting balance - Comments: sitting EOB. ABle to maintain static sitting balance without assist. When attempting figure 4 stretch with right LE, pt required min assist to maintain sitting balance. Postural control: Posterior lean                ADL either performed or assessed with clinical judgement      Cognition Arousal/Alertness: Awake/alert Behavior During Therapy: WFL for tasks assessed/performed Overall Cognitive Status: Within Functional Limits for tasks assessed            Exercises General Exercises - Lower Extremity Quad Sets: AROM, Left, 5 reps, Supine Straight Leg Raises: AROM, Both, 5 reps, Supine Hip Flexion/Marching: AROM, Right, 5 reps, Seated Other Exercises Other Exercises: Seated figure 4 stretch with RLE, min Asssist and verbal cues for proper form and technique. 1x10" Other Exercises: Supine figure 4 stretch completed with each LE, min assist and VC for proper form and technique, 1x10" each            Pertinent Vitals/ Pain       Pain Assessment Pain Assessment: Faces Faces Pain Scale: Hurts a little bit Pain Location: L residual limb Pain Descriptors / Indicators: Grimacing, Guarding Pain Intervention(s): Limited activity within patient's tolerance, Monitored during session, Repositioned  Frequency  Min 3X/week        Progress Toward Goals  OT Goals(current goals can now be found in the care plan section)  Progress towards OT goals: Progressing toward goals     Plan Discharge plan remains appropriate;Frequency remains appropriate       AM-PAC OT "6  Clicks" Daily Activity     Outcome Measure   Help from another person eating meals?: None Help from another person taking care of personal grooming?: A Little Help from another person toileting, which includes using toliet, bedpan, or urinal?: A Little Help from another person bathing (including washing, rinsing, drying)?: A Lot Help from another person to put on and taking off regular upper body clothing?: A Little Help from another person to put on and taking off regular lower body clothing?: A Lot 6 Click Score: 17    End of Session    OT Visit Diagnosis: Unsteadiness on feet (R26.81);Other abnormalities of gait and mobility (R26.89);Muscle weakness (generalized) (M62.81);Pain Pain - Right/Left: Left Pain - part of body: Leg   Activity Tolerance Patient tolerated treatment well   Patient Left in bed;with call bell/phone within reach           Time: 7253-6644 OT Time Calculation (min): 31 min  Charges: OT General Charges $OT Visit: 1 Visit OT Treatments $Therapeutic Activity: 8-22 mins $Therapeutic Exercise: 8-22 mins  Cassandra [name], OTR/L,CBIS  Supplemental OT - [facility] Secure Chat Preferred    Cassandra [name], Cassandra [name] 01/07/2023, 4:47 PM

## 2023-01-07 NOTE — Progress Notes (Signed)
Orthopaedic Trauma Progress Note  SUBJECTIVE: Patient doing fairly well this morning.  Remains medically stable for discharge. Awaiting insurance auth for CIR  OBJECTIVE:  Vitals:   01/06/23 2014 01/07/23 0428  BP: (!) 142/45 104/87  Pulse: (!) 57 60  Resp: 17 17  Temp: 98.2 F (36.8 C) 97.8 F (36.6 C)  SpO2: (!) 86% (!) 84%    General: Sitting up in bed, eating breakfast.  Pleasant and cooperative Respiratory: No increased work of breathing.  Left lower extremity: Stump shrinker in place. Hypersensitive around stump as expected.  No tenderness throughout the thigh.  Able to straight leg raise.  LABS:  Results for orders placed or performed during the hospital encounter of 01/03/23 (from the past 24 hour(s))  Glucose, capillary     Status: Abnormal   Collection Time: 01/06/23 11:21 AM  Result Value Ref Range   Glucose-Capillary 144 (H) 70 - 99 mg/dL  VITAMIN D 25 Hydroxy (Vit-D Deficiency, Fractures)     Status: None   Collection Time: 01/06/23  3:42 PM  Result Value Ref Range   Vit D, 25-Hydroxy 39.47 30 - 100 ng/mL  Glucose, capillary     Status: Abnormal   Collection Time: 01/06/23  4:02 PM  Result Value Ref Range   Glucose-Capillary 174 (H) 70 - 99 mg/dL  Glucose, capillary     Status: Abnormal   Collection Time: 01/06/23  8:17 PM  Result Value Ref Range   Glucose-Capillary 147 (H) 70 - 99 mg/dL  Glucose, capillary     Status: None   Collection Time: 01/07/23  7:43 AM  Result Value Ref Range   Glucose-Capillary 99 70 - 99 mg/dL    ASSESSMENT: Cassandra [name] is a 89 y.o. female, 4 Days Post-Op s/p LEFT AMPUTATION BELOW KNEE  CV/Blood loss: Acute blood loss anemia, Hgb 9.7 this morning.  Trending up from yesterday. Hemodynamically stable  PLAN: Weightbearing: NWB LLE ROM: Okay for hip and knee range of motion as tolerated Incisional and dressing care: Okay to leave incision open to air.  Change dressing as needed Showering: Okay to allow incision to get wet  when showering Orthopedic device(s): Stump shrinker LLE Pain management:  1. Tylenol 325-650 mg q 6 hours PRN 2. Robaxin 500 mg q 6 hours PRN 3. Norco 5-325 OR 7.5-325 mg q 4 hours PRN VTE prophylaxis:  Eliquis and Plavix , SCDs ID:  Ancef 2gm post op completed Foley/Lines:  No foley, KVO IVFs Impediments to Fracture Healing: Vitamin D level 39, will start on low dose supplementation Dispo: PT/OT evaluation ongoing, currently recommending CIR.  Okay for discharge once bed available.  Patient will require suture removal LLE around 01/17/2023  D/C recommendations: -Norco 5-325 mg for pain control -Continue home dose Plavix and Eliquis for DVT prophylaxis -Continue 1,000 units Vit D supplementation  Follow - up plan: 2 weeks after d/c for wound check    Contact information:  Truitt Merle MD, Thyra Breed PA-C. After hours and holidays please check [email or web address] for group call information for [facility]   Thompson Caul, PA-C [phone] (office) [email or web address]

## 2023-01-07 NOTE — Progress Notes (Signed)
Mobility Specialist Progress Note   01/07/23 1215  Mobility  Activity Dangled on edge of bed  Level of Assistance Standby assist, set-up cues, supervision of patient - no hands on  Assistive Device None  Range of Motion/Exercises Active  LLE Weight Bearing NWB  Activity Response Tolerated well   Patient received in supine and agreeable to participate. Deferred transferring to chair and all OOB activity secondary to soreness in her bottom. Completed bed mobility with supervision, only requiring minimal HHA to assist with scooting hips forward. Tolerated without complaint or incident. Was left dangling EOB with all needs met, call bell in reach.   [place] Klarissa Mcilvain, BS EXP Mobility Specialist Please contact via SecureChat or Rehab office at [phone]

## 2023-01-08 LAB — GLUCOSE, CAPILLARY
Glucose-Capillary: 103 mg/dL — ABNORMAL HIGH (ref 70–99)
Glucose-Capillary: 193 mg/dL — ABNORMAL HIGH (ref 70–99)
Glucose-Capillary: 205 mg/dL — ABNORMAL HIGH (ref 70–99)
Glucose-Capillary: 154 mg/dL — ABNORMAL HIGH (ref 70–99)

## 2023-01-08 MED ORDER — VITAMIN D 25 MCG (1000 UNIT) PO TABS
1000.0000 [IU] | ORAL_TABLET | Freq: Every day | ORAL | Status: DC
  Administered 2023-01-08 – 2023-01-14 (×7): 1000 [IU] via ORAL
  Filled 2023-01-08 (×7): qty 1

## 2023-01-08 NOTE — Progress Notes (Signed)
Mobility Specialist Progress Note   01/08/23 1600  Mobility  Activity Moved into chair position in bed (Bed level exercise)  Level of Assistance Standby assist, set-up cues, supervision of patient - no hands on  Assistive Device None  Range of Motion/Exercises Active  LLE Weight Bearing NWB  Activity Response Tolerated well   Patient received in supine. Deferred OOB activity due to just returning to bed. Agreeable to participate in bed level exercise. Completed BLE exercises with supervision. Tolerated without complaint or incident. Was left in supine with all needs met, call bell in reach.   [place] Vander Kueker, BS EXP Mobility Specialist Please contact via SecureChat or Rehab office at [phone] ]

## 2023-01-08 NOTE — Progress Notes (Signed)
Orthopaedic Trauma Progress Note  SUBJECTIVE: No acute events overnight.  Remains medically stable for discharge. Awaiting insurance auth for CIR  OBJECTIVE:  Vitals:   01/08/23 0523 01/08/23 0726  BP: 90/62 (!) 118/43  Pulse: 60 60  Resp: 18 18  Temp: 98 F (36.7 C) 98 F (36.7 C)  SpO2:  95%    General: Sitting up in bed. Pleasant and cooperative Respiratory: No increased work of breathing.  Left lower extremity: Stump shrinker in place. Hypersensitive around stump as expected.  No tenderness throughout the thigh.  Able to straight leg raise.  LABS:  Results for orders placed or performed during the hospital encounter of 01/03/23 (from the past 24 hour(s))  Glucose, capillary     Status: Abnormal   Collection Time: 01/07/23 11:33 AM  Result Value Ref Range   Glucose-Capillary 299 (H) 70 - 99 mg/dL  Glucose, capillary     Status: None   Collection Time: 01/07/23  4:29 PM  Result Value Ref Range   Glucose-Capillary 90 70 - 99 mg/dL  Glucose, capillary     Status: Abnormal   Collection Time: 01/07/23  8:08 PM  Result Value Ref Range   Glucose-Capillary 170 (H) 70 - 99 mg/dL  Glucose, capillary     Status: Abnormal   Collection Time: 01/08/23  7:21 AM  Result Value Ref Range   Glucose-Capillary 103 (H) 70 - 99 mg/dL    ASSESSMENT: Cassandra [name] is a 89 y.o. female, 5 Days Post-Op s/p LEFT AMPUTATION BELOW KNEE  CV/Blood loss: Hgb 9.7 on 01/07/23. Hemodynamically stable  PLAN: Weightbearing: NWB LLE ROM: Okay for hip and knee range of motion as tolerated Incisional and dressing care: Okay to leave incision open to air.  Change dressing as needed Showering: Okay to allow incision to get wet when showering Orthopedic device(s): Stump shrinker LLE Pain management:  1. Tylenol 325-650 mg q 6 hours PRN 2. Robaxin 500 mg q 6 hours PRN 3. Norco 5-325 OR 7.5-325 mg q 4 hours PRN VTE prophylaxis:  Eliquis and Plavix , SCDs ID:  Ancef 2gm post op completed Foley/Lines:   No foley, KVO IVFs Impediments to Fracture Healing: Vitamin D level 39, will start on low dose supplementation Dispo: PT/OT evaluation ongoing, currently recommending CIR.  Okay for discharge once bed available.  Patient will require suture removal LLE around 01/17/2023  D/C recommendations: -Norco 5-325 mg for pain control -Continue home dose Plavix and Eliquis for DVT prophylaxis -Continue 1,000 units Vit D supplementation  Follow - up plan: 2 weeks after d/c for wound check    Contact information:  Truitt Merle MD, Thyra Breed PA-C. After hours and holidays please check [email or web address] for group call information for [facility]   Thompson Caul, PA-C [phone] (office) [email or web address]

## 2023-01-08 NOTE — Progress Notes (Signed)
Physical Therapy Treatment Patient Details Name: DESIAH RENDELL MRN: [id number] DOB: Sep 02, 1933 Today's Date: 01/08/2023   History of Present Illness 89 yo female with onset of failed management of osteomyelitis was admitted 5/3 and had BKA on 5/4.  PMHx:  T2DM, CAD, sleep apnea, hx of breast cancer, hx of MI, neuromuscular disorder, COPD, hx of visual hallucinations, CKD, HTN    PT Comments    Pt is up to side of bed and helped her stand, worked on sliding and stepping on RLE on side of bed.  Pt is very fatigued by this effort, and had to sit three times.  PT then helped her directly pivot to the chair, and pt is reporting tiredness with the effort.  Has been in bed all day, encouraged her to be up in the chair more today.  Follow for rehab goals as are outlined on plan of care.   Recommendations for follow up therapy are one component of a multi-disciplinary discharge planning process, led by the attending physician.  Recommendations may be updated based on patient status, additional functional criteria and insurance authorization.  Follow Up Recommendations       Assistance Recommended at Discharge Frequent or constant Supervision/Assistance  Patient can return home with the following A little help with walking and/or transfers;A little help with bathing/dressing/bathroom;Assistance with cooking/housework;Assist for transportation;Help with stairs or ramp for entrance   Equipment Recommendations  Wheelchair (measurements PT);Wheelchair cushion (measurements PT);Other (comment)    Recommendations for Other Services Rehab consult     Precautions / Restrictions Precautions Precautions: Fall Precaution Comments: watch BP Restrictions Weight Bearing Restrictions: Yes LLE Weight Bearing: Non weight bearing     Mobility  Bed Mobility Overal bed mobility: Needs Assistance Bed Mobility: Supine to Sit Rolling: Min guard   Supine to sit: Min guard          Transfers Overall  transfer level: Needs assistance Equipment used: Rolling walker (2 wheels) Transfers: Sit to/from Stand, Bed to chair/wheelchair/BSC Sit to Stand: Mod assist Stand pivot transfers: Mod assist         General transfer comment: pt continues to need a lot of direction to move RLE and work to get to chair.  Pt sits out of breath, helped with direct assist to chair since RW was not practical and was fatiguing    Ambulation/Gait               General Gait Details: unable to take a step   Stairs             Wheelchair Mobility    Modified Rankin (Stroke Patients Only)       Balance Overall balance assessment: Needs assistance Sitting-balance support: Single extremity supported Sitting balance-Leahy Scale: Fair     Standing balance support: Bilateral upper extremity supported, During functional activity Standing balance-Leahy Scale: Poor                              Cognition Arousal/Alertness: Awake/alert Behavior During Therapy: Anxious Overall Cognitive Status: Within Functional Limits for tasks assessed                                 General Comments: pt is mildly agitated and anxious about her interactions with staff to help her move        Exercises General Exercises - Lower Extremity Ankle Circles/Pumps: AROM, 5 reps,  Right Quad Sets: AROM, 10 reps Gluteal Sets: AROM, 10 reps Heel Slides: AROM, 10 reps, Right    General Comments General comments (skin integrity, edema, etc.): Pt was assisted to try to sidestep to chair, sat three times to rest and then directly helped her pivot to the chair.  Pt is out of breath with the exertion, but recovered and was then comfortable      Pertinent Vitals/Pain Pain Assessment Pain Assessment: Faces Faces Pain Scale: Hurts a little bit Pain Location: L residual limb Pain Descriptors / Indicators: Guarding    Home Living                          Prior Function             PT Goals (current goals can now be found in the care plan section) Acute Rehab PT Goals Patient Stated Goal: to go home and be independent to walk again Progress towards PT goals: Progressing toward goals    Frequency    Min 3X/week      PT Plan Current plan remains appropriate    Co-evaluation              AM-PAC PT "6 Clicks" Mobility   Outcome Measure  Help needed turning from your back to your side while in a flat bed without using bedrails?: None Help needed moving from lying on your back to sitting on the side of a flat bed without using bedrails?: A Little Help needed moving to and from a bed to a chair (including a wheelchair)?: A Little Help needed standing up from a chair using your arms (e.g., wheelchair or bedside chair)?: A Little Help needed to walk in hospital room?: Total Help needed climbing 3-5 steps with a railing? : Total 6 Click Score: 15    End of Session Equipment Utilized During Treatment: Gait belt Activity Tolerance: Patient limited by fatigue;Treatment limited secondary to medical complications (Comment) Patient left: in chair;with call bell/phone within reach;with chair alarm set;with nursing/sitter in room Nurse Communication: Mobility status PT Visit Diagnosis: Unsteadiness on feet (R26.81);Muscle weakness (generalized) (M62.81);Difficulty in walking, not elsewhere classified (R26.2)     Time: 4403-4742 PT Time Calculation (min) (ACUTE ONLY): 23 min  Charges:  $Therapeutic Exercise: 8-22 mins $Therapeutic Activity: 8-22 mins        Ivar Drape 01/08/2023, 3:09 PM  Samul Dada, PT PhD Acute Rehab Dept. Number: [facility] [id number] and [facility] [phone]

## 2023-01-08 NOTE — Progress Notes (Signed)
Inpatient Rehab Admissions Coordinator:   Insurance requesting peer to peer by tomorrow 9 AM. Dr. Riley Kill to complete.  Will continue to follow.   Estill Dooms, PT, DPT Admissions Coordinator [phone] 01/08/23  5:10 PM

## 2023-01-08 NOTE — Progress Notes (Signed)
Inpatient Rehab Admissions Coordinator:   Continue to await determination from [facility]) regarding CIR prior auth request.  Will follow.   Estill Dooms, PT, DPT Admissions Coordinator [phone] 01/08/23  11:57 AM

## 2023-01-08 NOTE — Discharge Summary (Signed)
Orthopaedic Trauma Service (OTS) Discharge Summary   Patient ID: Cassandra [name] MRN: [id number] DOB/AGE: 1933-07-04 89 y.o.  Admit date: 01/03/2023 Discharge date: 01/14/2023  Admission Diagnoses: Left chronic osteomyelitis of left ankle/tibia   Discharge Diagnoses:  Principal Problem:   Chronic osteomyelitis involving ankle and foot, left (HCC)   Past Medical History:  Diagnosis Date   Acquired hypothyroidism 01/06/2014   Anxiety    Arthritis    B12 deficiency 02/15/2014   Benign essential hypertension 01/06/2014   Breast cancer (HCC) 1982   Right breast cancer - chemotherapy   CAD (coronary artery disease), native coronary artery 01/06/2014   Carotid artery calcification    Chronic airway obstruction, not elsewhere classified 01/06/2014   Chronic mesenteric ischemia (HCC)    s/p SMA stent 11/03/19   Depression    Diabetes mellitus without complication (HCC)    Dysrhythmia    aflutter with RVR 06/2016   History of kidney stones 2019   left ureteral stone   Incontinence of urine    Myocardial infarction (HCC) 1982   small infarct   Neuromuscular disorder (HCC)    restless legs   Pacemaker    Presence of permanent cardiac pacemaker 07/2016   Pure hypercholesterolemia 01/06/2014   Renal insufficiency    Skin cancer    Sleep apnea    no longer uses a cpap. Uses oxygen as needed     Procedures Performed:  Right knee amputation left leg  Discharged Condition: stable  Hospital Course: Patient presented to [facility] on 01/03/2023 for scheduled procedure on left lower extremity.  Was taken to the operating room by Dr. Jena Gauss for the above procedure.  She tolerated this well without complications.  Was admitted to the orthopedic service postoperatively for pain control and therapies.  Began working with physical and Occupational Therapy starting on postoperative day #1.  Resumed home dose Plavix and Eliquis postoperatively.  The remainder of  hospitalization was dedicated to achieving adequate pain control and increasing mobility to allow patient to progress toward inpatient rehab before returning home. On 01/14/2023, the patient was tolerating diet, working well with therapies, pain well controlled, vital signs stable, dressings clean, dry, intact and felt stable for discharge to [facility] inpatient rehab. Patient will follow up as below and knows to call with questions or concerns.     Consults: rehabilitation medicine  Significant Diagnostic Studies:   Results for orders placed or performed during the hospital encounter of 01/03/23 (from the past 168 hour(s))  Glucose, capillary   Collection Time: 01/07/23 11:33 AM  Result Value Ref Range   Glucose-Capillary 299 (H) 70 - 99 mg/dL  Glucose, capillary   Collection Time: 01/07/23  4:29 PM  Result Value Ref Range   Glucose-Capillary 90 70 - 99 mg/dL  Glucose, capillary   Collection Time: 01/07/23  8:08 PM  Result Value Ref Range   Glucose-Capillary 170 (H) 70 - 99 mg/dL  Glucose, capillary   Collection Time: 01/08/23  7:21 AM  Result Value Ref Range   Glucose-Capillary 103 (H) 70 - 99 mg/dL  Glucose, capillary   Collection Time: 01/08/23 11:36 AM  Result Value Ref Range   Glucose-Capillary 193 (H) 70 - 99 mg/dL  Glucose, capillary   Collection Time: 01/08/23  4:08 PM  Result Value Ref Range   Glucose-Capillary 205 (H) 70 - 99 mg/dL  Glucose, capillary   Collection Time: 01/08/23  9:43 PM  Result Value Ref Range   Glucose-Capillary 154 (H) 70 - 99  mg/dL  Glucose, capillary   Collection Time: 01/09/23  8:41 AM  Result Value Ref Range   Glucose-Capillary 104 (H) 70 - 99 mg/dL  Glucose, capillary   Collection Time: 01/09/23 11:07 AM  Result Value Ref Range   Glucose-Capillary 147 (H) 70 - 99 mg/dL  Glucose, capillary   Collection Time: 01/09/23  4:28 PM  Result Value Ref Range   Glucose-Capillary 260 (H) 70 - 99 mg/dL  Glucose, capillary   Collection Time: 01/09/23   9:25 PM  Result Value Ref Range   Glucose-Capillary 103 (H) 70 - 99 mg/dL  Glucose, capillary   Collection Time: 01/10/23  7:22 AM  Result Value Ref Range   Glucose-Capillary 117 (H) 70 - 99 mg/dL  Glucose, capillary   Collection Time: 01/10/23 11:21 AM  Result Value Ref Range   Glucose-Capillary 144 (H) 70 - 99 mg/dL  Glucose, capillary   Collection Time: 01/10/23  4:06 PM  Result Value Ref Range   Glucose-Capillary 110 (H) 70 - 99 mg/dL  Glucose, capillary   Collection Time: 01/10/23  8:38 PM  Result Value Ref Range   Glucose-Capillary 112 (H) 70 - 99 mg/dL  Glucose, capillary   Collection Time: 01/11/23  8:12 AM  Result Value Ref Range   Glucose-Capillary 117 (H) 70 - 99 mg/dL  Glucose, capillary   Collection Time: 01/11/23 11:19 AM  Result Value Ref Range   Glucose-Capillary 123 (H) 70 - 99 mg/dL  Glucose, capillary   Collection Time: 01/11/23  4:10 PM  Result Value Ref Range   Glucose-Capillary 133 (H) 70 - 99 mg/dL  Glucose, capillary   Collection Time: 01/11/23  7:41 PM  Result Value Ref Range   Glucose-Capillary 128 (H) 70 - 99 mg/dL  Glucose, capillary   Collection Time: 01/12/23  7:52 AM  Result Value Ref Range   Glucose-Capillary 96 70 - 99 mg/dL  Glucose, capillary   Collection Time: 01/12/23 11:16 AM  Result Value Ref Range   Glucose-Capillary 105 (H) 70 - 99 mg/dL  Glucose, capillary   Collection Time: 01/12/23  3:55 PM  Result Value Ref Range   Glucose-Capillary 139 (H) 70 - 99 mg/dL  Glucose, capillary   Collection Time: 01/12/23  8:14 PM  Result Value Ref Range   Glucose-Capillary 56 (L) 70 - 99 mg/dL  Glucose, capillary   Collection Time: 01/12/23  8:59 PM  Result Value Ref Range   Glucose-Capillary 69 (L) 70 - 99 mg/dL  Glucose, capillary   Collection Time: 01/12/23 10:01 PM  Result Value Ref Range   Glucose-Capillary 57 (L) 70 - 99 mg/dL  Glucose, capillary   Collection Time: 01/12/23 11:07 PM  Result Value Ref Range    Glucose-Capillary 129 (H) 70 - 99 mg/dL  Glucose, capillary   Collection Time: 01/13/23  8:07 AM  Result Value Ref Range   Glucose-Capillary 118 (H) 70 - 99 mg/dL  Glucose, capillary   Collection Time: 01/13/23 11:41 AM  Result Value Ref Range   Glucose-Capillary 145 (H) 70 - 99 mg/dL  Glucose, capillary   Collection Time: 01/13/23  4:42 PM  Result Value Ref Range   Glucose-Capillary 140 (H) 70 - 99 mg/dL  Glucose, capillary   Collection Time: 01/13/23  8:13 PM  Result Value Ref Range   Glucose-Capillary 87 70 - 99 mg/dL  Glucose, capillary   Collection Time: 01/14/23  8:06 AM  Result Value Ref Range   Glucose-Capillary 144 (H) 70 - 99 mg/dL     Treatments: IV  hydration, antibiotics: Ancef, analgesia: acetaminophen and Vicodin, cardiac meds: Irbesartan and torsemide, anticoagulation: Plavix and Eliquis, insulin: regular, therapies: PT and OT, and surgery: As above  Discharge Exam: General: Sitting up in bed. Pleasant and cooperative Respiratory: No increased work of breathing.  Left lower extremity: Stump shrinker in place. Hypersensitive around stump as expected.  No tenderness throughout the thigh.  Able to straight leg raise.  Disposition: Discharge disposition: 62-Rehab Facility       Discharge Instructions and Plan: Patient will be discharged to [facility] inpatient rehab for continued therapy services. Will be continued on her Plavix and Eliquis. Patient will be provided all the necessary DME for discharge.  OTS will plan to follow patient while in hospital and arrange for follow up with Dr. Jena Gauss in 2 weeks after discharge for suture removal.   Signed:  Thompson Caul, PA-C ?[phone] (phone) 01/14/2023, 9:52 AM  [facility] [address] [place] [address] [phone] Collier Bullock (F)

## 2023-01-09 LAB — GLUCOSE, CAPILLARY
Glucose-Capillary: 104 mg/dL — ABNORMAL HIGH (ref 70–99)
Glucose-Capillary: 147 mg/dL — ABNORMAL HIGH (ref 70–99)
Glucose-Capillary: 260 mg/dL — ABNORMAL HIGH (ref 70–99)
Glucose-Capillary: 103 mg/dL — ABNORMAL HIGH (ref 70–99)

## 2023-01-09 MED ORDER — DEXAMETHASONE SODIUM PHOSPHATE 10 MG/ML IJ SOLN
INTRAMUSCULAR | Status: DC | PRN
  Administered 2023-01-03 (×2): 5 mg

## 2023-01-09 MED ORDER — ROPIVACAINE HCL 5 MG/ML IJ SOLN
INTRAMUSCULAR | Status: DC | PRN
  Administered 2023-01-03: 10 mL via PERINEURAL
  Administered 2023-01-03: 25 mL via PERINEURAL

## 2023-01-09 NOTE — Progress Notes (Signed)
Orthopaedic Trauma Progress Note  SUBJECTIVE: No acute events overnight.  Remains medically stable for discharge. [employer] Medicare has denied CIR.  Patient and her husband would like to appeal the decision.   OBJECTIVE:  Vitals:   01/08/23 2018 01/09/23 0839  BP: (!) 139/57 (!) 93/50  Pulse: 74   Resp: 19 18  Temp: 98 F (36.7 C) (!) 97 F (36.1 C)  SpO2: 92% (!) 82%    LABS:  Results for orders placed or performed during the hospital encounter of 01/03/23 (from the past 24 hour(s))  Glucose, capillary     Status: Abnormal   Collection Time: 01/08/23  4:08 PM  Result Value Ref Range   Glucose-Capillary 205 (H) 70 - 99 mg/dL  Glucose, capillary     Status: Abnormal   Collection Time: 01/08/23  9:43 PM  Result Value Ref Range   Glucose-Capillary 154 (H) 70 - 99 mg/dL  Glucose, capillary     Status: Abnormal   Collection Time: 01/09/23  8:41 AM  Result Value Ref Range   Glucose-Capillary 104 (H) 70 - 99 mg/dL  Glucose, capillary     Status: Abnormal   Collection Time: 01/09/23 11:07 AM  Result Value Ref Range   Glucose-Capillary 147 (H) 70 - 99 mg/dL    ASSESSMENT: Cassandra [name] is a 89 y.o. female, 6 Days Post-Op s/p LEFT AMPUTATION BELOW KNEE  CV/Blood loss: Hgb 9.7 on 01/07/23. Hemodynamically stable  PLAN: Weightbearing: NWB LLE ROM: Okay for hip and knee range of motion as tolerated Incisional and dressing care: Okay to leave incision open to air.  Change dressing as needed Showering: Okay to allow incision to get wet when showering Orthopedic device(s): Stump shrinker LLE Pain management:  1. Tylenol 325-650 mg q 6 hours PRN 2. Robaxin 500 mg q 6 hours PRN 3. Norco 5-325 OR 7.5-325 mg q 4 hours PRN VTE prophylaxis:  Eliquis and Plavix , SCDs ID:  Ancef 2gm post op completed Foley/Lines:  No foley, KVO IVFs Impediments to Fracture Healing: Vitamin D level 39, will start on low dose supplementation Dispo: PT/OT evaluation ongoing, currently recommending CIR.  insurance authorization denied.  Patient plans to appeal decision.  Okay for discharge once bed available.  Patient will require suture removal LLE around 01/17/2023  D/C recommendations: -Norco 5-325 mg for pain control -Continue home dose Plavix and Eliquis for DVT prophylaxis -Continue 1,000 units Vit D supplementation  Follow - up plan: 2 weeks after d/c for wound check    Contact information:  Truitt Merle MD, Thyra Breed PA-C. After hours and holidays please check [email or web address] for group call information for [facility]   Thompson Caul, PA-C [phone] (office) [email or web address]

## 2023-01-09 NOTE — Anesthesia Postprocedure Evaluation (Signed)
Anesthesia Post Note  Patient: Cassandra [name]  Procedure(s) Performed: AMPUTATION BELOW KNEE (Left: Knee)     Patient location during evaluation: PACU Anesthesia Type: Regional and General Level of consciousness: awake and alert Pain management: pain level controlled Vital Signs Assessment: post-procedure vital signs reviewed and stable Respiratory status: spontaneous breathing, nonlabored ventilation, respiratory function stable and patient connected to nasal cannula oxygen Cardiovascular status: blood pressure returned to baseline and stable Postop Assessment: no apparent nausea or vomiting Anesthetic complications: no   No notable events documented.  Last Vitals:  Vitals:   01/09/23 0839 01/09/23 1334  BP: (!) 93/50 (!) 108/41  Pulse:  64  Resp: 18 19  Temp: (!) 36.1 C 36.4 C  SpO2: (!) 82%     Last Pain:  Vitals:   01/09/23 1544  TempSrc:   PainSc: 0-No pain   Pain Goal:                   Darrall Strey L Leeon Makar

## 2023-01-09 NOTE — Progress Notes (Signed)
Inpatient Rehab Admissions Coordinator:   Received a denial from [facility] Medicare). I spoke to pt's spouse on the phone to update and they would like to appeal decision. I will proceed with this today.   Estill Dooms, PT, DPT Admissions Coordinator [phone] 01/09/23  11:44 AM

## 2023-01-09 NOTE — Anesthesia Procedure Notes (Signed)
Anesthesia Regional Block: Popliteal block   Pre-Anesthetic Checklist: , timeout performed,  Correct Patient, Correct Site, Correct Laterality,  Correct Procedure, Correct Position, site marked,  Risks and benefits discussed,  Pre-op evaluation,  At surgeon's request and post-op pain management  Laterality: Left  Prep: Maximum Sterile Barrier Precautions used, chloraprep       Needles:  Injection technique: Single-shot  Needle Type: Echogenic Stimulator Needle     Needle Length: 9cm  Needle Gauge: 21     Additional Needles:   Procedures:,,,, ultrasound used (permanent image in chart),,    Narrative:  Start time: 01/03/2023 7:58 AM End time: 01/03/2023 8:00 AM Injection made incrementally with aspirations every 5 mL. Anesthesiologist: Elmer Picker, MD

## 2023-01-09 NOTE — Progress Notes (Signed)
Occupational Therapy Treatment Patient Details Name: Cassandra [name] MRN: [id number] DOB: [birth year] Today's Date: 01/09/2023   History of present illness 89 yo female with onset of failed management of osteomyelitis was admitted 5/3 and had BKA on 5/4.  PMHx:  T2DM, CAD, sleep apnea, hx of breast cancer, hx of MI, neuromuscular disorder, COPD, hx of visual hallucinations, CKD, HTN   OT comments  Pt progressing toward established OT goals and motivated to progress functional performance. Pt performing LB ADL with mod-max A this session. OF facilitating greater independence with cues for positioning of body and technique to don shrinker. Pt with mild bleeding so RN entering session apply gauze dressing. Education provided during this time of desensitization techniques to reduce experience of phantom limb pain and sensations. Pt continues to demonstrate excellent rehab potential, and thus, highly recommending inpatient rehabilitation >3 hours/day.    Recommendations for follow up therapy are one component of a multi-disciplinary discharge planning process, led by the attending physician.  Recommendations may be updated based on patient status, additional functional criteria and insurance authorization.    Assistance Recommended at Discharge Frequent or constant Supervision/Assistance  Patient can return home with the following  A lot of help with walking and/or transfers;A lot of help with bathing/dressing/bathroom   Equipment Recommendations  None recommended by OT    Recommendations for Other Services      Precautions / Restrictions Precautions Precautions: Fall Precaution Comments: watch BP Restrictions Weight Bearing Restrictions: Yes LLE Weight Bearing: Non weight bearing       Mobility Bed Mobility Overal bed mobility: Needs Assistance Bed Mobility: Supine to Sit, Sit to Supine     Supine to sit: Min assist Sit to supine: Min guard   General bed mobility comments: Min A  for truncal elevation and cues for sequencing of movement    Transfers Overall transfer level: Needs assistance Equipment used: Rolling walker (2 wheels) Transfers: Sit to/from Stand Sit to Stand: Mod assist           General transfer comment: VC for hand placemment and technique, mod A for rise     Balance Overall balance assessment: Needs assistance Sitting-balance support: Single extremity supported Sitting balance-Leahy Scale: Fair                                     ADL either performed or assessed with clinical judgement   ADL Overall ADL's : Needs assistance/impaired                     Lower Body Dressing: Maximal assistance;Sit to/from stand Lower Body Dressing Details (indicate cue type and reason): focus session on ADL retraining. Pt requiring max A for donning LB dressing as well as Product manager: Moderate assistance;Rolling walker (2 wheels)                  Extremity/Trunk Assessment Upper Extremity Assessment Upper Extremity Assessment: Overall WFL for tasks assessed   Lower Extremity Assessment Lower Extremity Assessment: Defer to PT evaluation        Vision       Perception     Praxis      Cognition Arousal/Alertness: Awake/alert Behavior During Therapy: Anxious Overall Cognitive Status: Within Functional Limits for tasks assessed  General Comments: mildly agitated with poor bed positioning on arrival        Exercises      Shoulder Instructions       General Comments Education regarding LB ADL. When doffing limb shrinker, pt with mild bleeding; RN notified and entering session to apply gauze wrap. Pt was educated during this time on desensitization strategfies tio reduce phantom limb pain and sensations as well as education on positioning    Pertinent Vitals/ Pain       Pain Assessment Pain Assessment: Faces Faces Pain Scale: Hurts a little  bit Pain Location: L residual limb Pain Descriptors / Indicators: Guarding Pain Intervention(s): Limited activity within patient's tolerance, Monitored during session  Home Living                                          Prior Functioning/Environment              Frequency  Min 3X/week        Progress Toward Goals  OT Goals(current goals can now be found in the care plan section)  Progress towards OT goals: Progressing toward goals  Acute Rehab OT Goals Patient Stated Goal: I would like to go to rehab OT Goal Formulation: With patient Time For Goal Achievement: 01/19/23 Potential to Achieve Goals: Good ADL Goals Pt Will Perform Upper Body Dressing: with set-up;with supervision;sitting Pt Will Perform Lower Body Dressing: with min guard assist;sitting/lateral leans;bed level Pt Will Transfer to Toilet: with min assist;with transfer board;stand pivot transfer;bedside commode Pt Will Perform Toileting - Clothing Manipulation and hygiene: with supervision;sitting/lateral leans  Plan Discharge plan remains appropriate;Frequency remains appropriate    Co-evaluation                 AM-PAC OT "6 Clicks" Daily Activity     Outcome Measure   Help from another person eating meals?: None Help from another person taking care of personal grooming?: A Little Help from another person toileting, which includes using toliet, bedpan, or urinal?: A Little Help from another person bathing (including washing, rinsing, drying)?: A Lot Help from another person to put on and taking off regular upper body clothing?: A Little Help from another person to put on and taking off regular lower body clothing?: A Lot 6 Click Score: 17    End of Session Equipment Utilized During Treatment: Rolling walker (2 wheels);Gait belt  OT Visit Diagnosis: Unsteadiness on feet (R26.81);Other abnormalities of gait and mobility (R26.89);Muscle weakness (generalized)  (M62.81);Pain Pain - Right/Left: Left Pain - part of body: Leg   Activity Tolerance Patient tolerated treatment well   Patient Left in bed;with call bell/phone within reach   Nurse Communication Mobility status        Time: 0981-1914 OT Time Calculation (min): 33 min  Charges: OT General Charges $OT Visit: 1 Visit OT Treatments $Self Care/Home Management : 23-37 mins  Tyler Deis, OTR/L [facility] Acute Rehabilitation Office: [phone]   Myrla Halsted 01/09/2023, 5:53 PM

## 2023-01-09 NOTE — Progress Notes (Signed)
Mobility Specialist Progress Note   01/09/23 0945  Mobility  Activity Dangled on edge of bed;Stood at bedside  Level of Assistance Minimal assist, patient does 75% or more  Assistive Device Stedy  Range of Motion/Exercises Active;All extremities  LLE Weight Bearing NWB  Activity Response Tolerated well   Patient received in supine and agreeable to participate. Presented A&O x4 but with some forgetfulness/confusion as she did not remember me or past sessions. Completed bed mobility min guard with increased time and cues to scoot hips forward. Was fixated on completing all task independently. Attempted to stand onto Corene Cornea independently, achieving x2 partial stands. Stood x2 requiring min A to achieve a full upright standing position. Tolerated without complaint or incident and was able to stand for extended time. Returned to supine with mod A and was left with all needs met, call bell in reach.   [place] Clytie Shetley, BS EXP Mobility Specialist Please contact via SecureChat or Rehab office at [phone]

## 2023-01-09 NOTE — Anesthesia Procedure Notes (Signed)
Anesthesia Regional Block: Adductor canal block   Pre-Anesthetic Checklist: , timeout performed,  Correct Patient, Correct Site, Correct Laterality,  Correct Procedure, Correct Position, site marked,  Risks and benefits discussed,  Pre-op evaluation,  At surgeon's request and post-op pain management  Laterality: Left  Prep: Maximum Sterile Barrier Precautions used, chloraprep       Needles:  Injection technique: Single-shot  Needle Type: Echogenic Stimulator Needle     Needle Length: 9cm  Needle Gauge: 21     Additional Needles:   Procedures:,,,, ultrasound used (permanent image in chart),,    Narrative:  Start time: 01/09/2023 8:00 AM End time: 01/09/2023 8:03 AM Injection made incrementally with aspirations every 5 mL. Anesthesiologist: Elmer Picker, MD

## 2023-01-09 NOTE — Plan of Care (Signed)

## 2023-01-10 LAB — GLUCOSE, CAPILLARY
Glucose-Capillary: 117 mg/dL — ABNORMAL HIGH (ref 70–99)
Glucose-Capillary: 144 mg/dL — ABNORMAL HIGH (ref 70–99)
Glucose-Capillary: 110 mg/dL — ABNORMAL HIGH (ref 70–99)
Glucose-Capillary: 112 mg/dL — ABNORMAL HIGH (ref 70–99)

## 2023-01-10 NOTE — Care Management Important Message (Signed)
Important Message  Patient Details  Name: Cassandra [name] MRN: [id number] Date of Birth: [birth year]   Medicare Important Message Given:  Yes     Sherilyn Banker 01/10/2023, 3:11 PM

## 2023-01-10 NOTE — Progress Notes (Signed)
Inpatient Rehab Admissions Coordinator:   Expedited appeal pending.  Will follow.   Estill Dooms, PT, DPT Admissions Coordinator [phone] 01/10/23  10:25 AM

## 2023-01-10 NOTE — Progress Notes (Signed)
Orthopaedic Trauma Progress Note  SUBJECTIVE: Doing well today, no specific complaints. Is in good spirits. Husband at bedside.  Remains medically stable for discharge. CIR denied, awaiting appeal.  OBJECTIVE:  Vitals:   01/10/23 0726 01/10/23 1335  BP: (!) 128/45 110/60  Pulse: 60 62  Resp: 17 16  Temp: 97.8 F (36.6 C) 98.1 F (36.7 C)  SpO2: 96% 94%    General: Sitting up in bed. Pleasant and cooperative Respiratory: No increased work of breathing.  Left lower extremity: Stump shrinker in place. Hypersensitive around stump as expected.  No tenderness throughout the thigh.  Able to straight leg raise.  LABS:  Results for orders placed or performed during the hospital encounter of 01/03/23 (from the past 24 hour(s))  Glucose, capillary     Status: Abnormal   Collection Time: 01/09/23  4:28 PM  Result Value Ref Range   Glucose-Capillary 260 (H) 70 - 99 mg/dL  Glucose, capillary     Status: Abnormal   Collection Time: 01/09/23  9:25 PM  Result Value Ref Range   Glucose-Capillary 103 (H) 70 - 99 mg/dL  Glucose, capillary     Status: Abnormal   Collection Time: 01/10/23  7:22 AM  Result Value Ref Range   Glucose-Capillary 117 (H) 70 - 99 mg/dL  Glucose, capillary     Status: Abnormal   Collection Time: 01/10/23 11:21 AM  Result Value Ref Range   Glucose-Capillary 144 (H) 70 - 99 mg/dL    ASSESSMENT: Cassandra [name] is a 89 y.o. female, 7 Days Post-Op s/p LEFT AMPUTATION BELOW KNEE  CV/Blood loss: Hgb 9.7 on 01/07/23. Hemodynamically stable  PLAN: Weightbearing: NWB LLE ROM: Okay for hip and knee range of motion as tolerated Incisional and dressing care: Okay to leave incision open to air.  Change dressing as needed Showering: Okay to allow incision to get wet when showering Orthopedic device(s): Stump shrinker LLE Pain management:  1. Tylenol 325-650 mg q 6 hours PRN 2. Robaxin 500 mg q 6 hours PRN 3. Norco 5-325 OR 7.5-325 mg q 4 hours PRN VTE prophylaxis:   Eliquis and Plavix , SCDs ID:  Ancef 2gm post op completed Foley/Lines:  No foley, KVO IVFs Impediments to Fracture Healing: Vitamin D level 39, will start on low dose supplementation Dispo: PT/OT evaluation ongoing, currently recommending CIR.  Initially denied, appeal pending. Plan for d/c once final decision is made by insurance. Patient will require suture removal LLE around 01/17/2023  D/C recommendations: -Norco 5-325 mg for pain control -Continue home dose Plavix and Eliquis for DVT prophylaxis -Continue 1,000 units Vit D supplementation  Follow - up plan: 2 weeks after d/c for wound check    Contact information:  Truitt Merle MD, Thyra Breed PA-C. After hours and holidays please check [email or web address] for group call information for [facility]   Thompson Caul, PA-C [phone] (office) [email or web address]

## 2023-01-11 LAB — GLUCOSE, CAPILLARY
Glucose-Capillary: 117 mg/dL — ABNORMAL HIGH (ref 70–99)
Glucose-Capillary: 123 mg/dL — ABNORMAL HIGH (ref 70–99)
Glucose-Capillary: 133 mg/dL — ABNORMAL HIGH (ref 70–99)
Glucose-Capillary: 128 mg/dL — ABNORMAL HIGH (ref 70–99)

## 2023-01-11 NOTE — Plan of Care (Signed)
?  Problem: Nutrition: ?Goal: Adequate nutrition will be maintained ?Outcome: Progressing ?  ?Problem: Activity: ?Goal: Risk for activity intolerance will decrease ?Outcome: Not Progressing ?  ?

## 2023-01-11 NOTE — Progress Notes (Signed)
Physical Therapy Treatment Patient Details Name: Cassandra [name] MRN: [id number] DOB: 1933-08-07 Today's Date: 01/11/2023   History of Present Illness 89 yo female with onset of failed management of osteomyelitis was admitted 5/3 and had BKA on 5/4.  PMHx:  T2DM, CAD, sleep apnea, hx of breast cancer, hx of MI, neuromuscular disorder, COPD, hx of visual hallucinations, CKD, HTN    PT Comments    Pt greeted resting in bed and agreeable to session with steady progress towards acute goals. Pt able to complete bed mobility with min A to elevate trunk and scoot out to EOB and transfer to stand x5 to RW with min A to steady initially up to mod A to power up as pt fatigue increasing. Increased time spent progressing standing balance with cues for anterior weight shift as pt with tendency to brace RLE against EOB. Pt unable to coordinate shifting R foot "heel-toe" to pivot to chair and ultimately needing total A to squat pivot. Pt continues to be motivated and current plan remains appropriate to address deficits and maximize functional independence and decrease caregiver burden. Pt continues to benefit from skilled PT services to progress toward functional mobility goals.     Recommendations for follow up therapy are one component of a multi-disciplinary discharge planning process, led by the attending physician.  Recommendations may be updated based on patient status, additional functional criteria and insurance authorization.  Follow Up Recommendations       Assistance Recommended at Discharge Frequent or constant Supervision/Assistance  Patient can return home with the following A little help with walking and/or transfers;A little help with bathing/dressing/bathroom;Assistance with cooking/housework;Assist for transportation;Help with stairs or ramp for entrance   Equipment Recommendations  Wheelchair (measurements PT);Wheelchair cushion (measurements PT);Other (comment)    Recommendations for  Other Services       Precautions / Restrictions Precautions Precautions: Fall Precaution Comments: watch BP Restrictions Weight Bearing Restrictions: Yes LLE Weight Bearing: Non weight bearing     Mobility  Bed Mobility Overal bed mobility: Needs Assistance Bed Mobility: Supine to Sit     Supine to sit: Min assist     General bed mobility comments: Min A for truncal elevation with pt reaching for this PTAs hand and to scoot out to EOB with use of bed pads    Transfers Overall transfer level: Needs assistance Equipment used: Rolling walker (2 wheels) Transfers: Sit to/from Stand Sit to Stand: Mod assist, Min assist     Squat pivot transfers: Total assist     General transfer comment: VC for hand placemment and technique, min A to rise initially up to mod A with repeated practice as pt fatigues, demonstrating heel-toe to pivot to chair and max cues in standing to complete but pt unable to complete needing total A to sqaut pivot to R    Ambulation/Gait               General Gait Details: unable   Stairs             Wheelchair Mobility    Modified Rankin (Stroke Patients Only)       Balance Overall balance assessment: Needs assistance Sitting-balance support: Single extremity supported Sitting balance-Leahy Scale: Fair Sitting balance - Comments: sitting EOB. ABle to maintain static sitting balance without assist. Postural control: Posterior lean Standing balance support: Bilateral upper extremity supported, During functional activity Standing balance-Leahy Scale: Poor Standing balance comment: heavy reliance on UE support with cues for anterior weight shift as pt bracing RLE  on bed                            Cognition Arousal/Alertness: Awake/alert Behavior During Therapy: Anxious Overall Cognitive Status: Within Functional Limits for tasks assessed                                          Exercises General  Exercises - Lower Extremity Quad Sets: AROM, Right, 5 reps    General Comments        Pertinent Vitals/Pain Pain Assessment Pain Assessment: Faces Faces Pain Scale: Hurts little more Pain Location: L residual limb Pain Descriptors / Indicators: Guarding, Discomfort, Grimacing Pain Intervention(s): Monitored during session, Limited activity within patient's tolerance, Repositioned    Home Living                          Prior Function            PT Goals (current goals can now be found in the care plan section) Acute Rehab PT Goals Patient Stated Goal: to go home and be independent to walk again PT Goal Formulation: With patient Time For Goal Achievement: 01/18/23 Progress towards PT goals: Progressing toward goals    Frequency    Min 3X/week      PT Plan Current plan remains appropriate    Co-evaluation              AM-PAC PT "6 Clicks" Mobility   Outcome Measure  Help needed turning from your back to your side while in a flat bed without using bedrails?: None Help needed moving from lying on your back to sitting on the side of a flat bed without using bedrails?: A Little Help needed moving to and from a bed to a chair (including a wheelchair)?: A Lot Help needed standing up from a chair using your arms (e.g., wheelchair or bedside chair)?: A Little Help needed to walk in hospital room?: Total Help needed climbing 3-5 steps with a railing? : Total 6 Click Score: 14    End of Session Equipment Utilized During Treatment: Gait belt Activity Tolerance: Patient limited by fatigue;Patient tolerated treatment well Patient left: in chair;with call bell/phone within reach Nurse Communication: Mobility status;Patient requests pain meds PT Visit Diagnosis: Unsteadiness on feet (R26.81);Muscle weakness (generalized) (M62.81);Difficulty in walking, not elsewhere classified (R26.2)     Time: 1610-9604 PT Time Calculation (min) (ACUTE ONLY): 19  min  Charges:  $Therapeutic Activity: 8-22 mins                     Janie Strothman R. PTA Acute Rehabilitation Services Office: [phone]    Catalina Antigua 01/11/2023, 12:08 PM

## 2023-01-12 LAB — GLUCOSE, CAPILLARY
Glucose-Capillary: 96 mg/dL (ref 70–99)
Glucose-Capillary: 105 mg/dL — ABNORMAL HIGH (ref 70–99)
Glucose-Capillary: 139 mg/dL — ABNORMAL HIGH (ref 70–99)
Glucose-Capillary: 56 mg/dL — ABNORMAL LOW (ref 70–99)
Glucose-Capillary: 69 mg/dL — ABNORMAL LOW (ref 70–99)
Glucose-Capillary: 57 mg/dL — ABNORMAL LOW (ref 70–99)
Glucose-Capillary: 129 mg/dL — ABNORMAL HIGH (ref 70–99)

## 2023-01-12 MED ORDER — DEXTROSE 50 % IV SOLN
12.5000 g | INTRAVENOUS | Status: AC
  Filled 2023-01-12: qty 50

## 2023-01-12 NOTE — Progress Notes (Signed)
Orthopaedic Trauma Progress Note  SUBJECTIVE: No acute events overnight.  Remains medically stable for discharge. [employer] Medicare has denied CIR.  Patient and her husband waiting for appeal decision.  OBJECTIVE:  Vitals:   01/12/23 0522 01/12/23 0750  BP: (!) 141/50 (!) 118/43  Pulse: 66 (!) 59  Resp: 16 18  Temp: 97.6 F (36.4 C) 98 F (36.7 C)  SpO2: 100% 98%    LABS:  Results for orders placed or performed during the hospital encounter of 01/03/23 (from the past 24 hour(s))  Glucose, capillary     Status: Abnormal   Collection Time: 01/11/23 11:19 AM  Result Value Ref Range   Glucose-Capillary 123 (H) 70 - 99 mg/dL  Glucose, capillary     Status: Abnormal   Collection Time: 01/11/23  4:10 PM  Result Value Ref Range   Glucose-Capillary 133 (H) 70 - 99 mg/dL  Glucose, capillary     Status: Abnormal   Collection Time: 01/11/23  7:41 PM  Result Value Ref Range   Glucose-Capillary 128 (H) 70 - 99 mg/dL  Glucose, capillary     Status: None   Collection Time: 01/12/23  7:52 AM  Result Value Ref Range   Glucose-Capillary 96 70 - 99 mg/dL    ASSESSMENT: Cassandra [name] is a 89 y.o. female, 9 Days Post-Op s/p LEFT AMPUTATION BELOW KNEE  CV/Blood loss: Hgb 9.7 on 01/07/23. Hemodynamically stable  PLAN: Weightbearing: NWB LLE ROM: Okay for hip and knee range of motion as tolerated Incisional and dressing care: Okay to leave incision open to air.  Change dressing as needed Showering: Okay to allow incision to get wet when showering Orthopedic device(s): Stump shrinker LLE Pain management:  1. Tylenol 325-650 mg q 6 hours PRN 2. Robaxin 500 mg q 6 hours PRN 3. Norco 5-325 OR 7.5-325 mg q 4 hours PRN VTE prophylaxis:  Eliquis and Plavix , SCDs ID:  Ancef 2gm post op completed Foley/Lines:  No foley, KVO IVFs Impediments to Fracture Healing: Vitamin D level 39, will start on low dose supplementation Dispo: PT/OT evaluation ongoing, currently recommending CIR. insurance  authorization denied.  Patient plans to appeal decision.  Okay for discharge once bed available.  Patient will require suture removal LLE around 01/17/2023  D/C recommendations: -Norco 5-325 mg for pain control -Continue home dose Plavix and Eliquis for DVT prophylaxis -Continue 1,000 units Vit D supplementation  Follow - up plan: 2 weeks after d/c for wound check    Contact information:  Truitt Merle MD, Thyra Breed PA-C. After hours and holidays please check [email or web address] for group call information for [facility]   Thompson Caul, PA-C [phone] (office) [email or web address]

## 2023-01-12 NOTE — Progress Notes (Signed)
Pt blood sugar dropped again to 57, IV infiltrated,IV team consulted , gave pt orange juice again, will check cbg again in 15 minutes while waiting for new IV, so I can give patient D50 and follow the rest of the protocol. Pt is asymptomatic, will continue to monitor.

## 2023-01-13 LAB — GLUCOSE, CAPILLARY
Glucose-Capillary: 118 mg/dL — ABNORMAL HIGH (ref 70–99)
Glucose-Capillary: 145 mg/dL — ABNORMAL HIGH (ref 70–99)
Glucose-Capillary: 140 mg/dL — ABNORMAL HIGH (ref 70–99)
Glucose-Capillary: 87 mg/dL (ref 70–99)

## 2023-01-13 MED ORDER — INSULIN ASPART 100 UNIT/ML IJ SOLN: 0.0000 [IU] | Freq: Every day | INTRAMUSCULAR | Status: DC

## 2023-01-13 MED ORDER — TRAMADOL HCL 50 MG PO TABS
50.0000 mg | ORAL_TABLET | Freq: Four times a day (QID) | ORAL | Status: DC | PRN
  Administered 2023-01-13: 50 mg via ORAL
  Filled 2023-01-13: qty 1

## 2023-01-13 MED ORDER — APIXABAN 2.5 MG PO TABS
2.5000 mg | ORAL_TABLET | Freq: Two times a day (BID) | ORAL | Status: DC
  Administered 2023-01-13 – 2023-01-14 (×2): 2.5 mg via ORAL
  Filled 2023-01-13 (×2): qty 1

## 2023-01-13 MED ORDER — INSULIN GLARGINE-YFGN 100 UNIT/ML ~~LOC~~ SOLN
5.0000 [IU] | Freq: Every day | SUBCUTANEOUS | Status: DC
  Administered 2023-01-13: 5 [IU] via SUBCUTANEOUS
  Filled 2023-01-13 (×2): qty 0.05

## 2023-01-13 MED ORDER — INSULIN ASPART 100 UNIT/ML IJ SOLN
0.0000 [IU] | Freq: Three times a day (TID) | INTRAMUSCULAR | Status: DC
  Administered 2023-01-14: 1 [IU] via SUBCUTANEOUS

## 2023-01-13 NOTE — Progress Notes (Signed)
Orthopaedic Trauma Progress Note  SUBJECTIVE: Some confusion overnight. Doing okay this AM. Saw patient before appeal was noted to be approved  OBJECTIVE:  Vitals:   01/13/23 0617 01/13/23 0800  BP: (!) 145/45 (!) 147/52  Pulse: (!) 59 (!) 59  Resp:  16  Temp: 97.8 F (36.6 C) 97.8 F (36.6 C)  SpO2:  97%    LABS:  Results for orders placed or performed during the hospital encounter of 01/03/23 (from the past 24 hour(s))  Glucose, capillary     Status: Abnormal   Collection Time: 01/12/23 11:16 AM  Result Value Ref Range   Glucose-Capillary 105 (H) 70 - 99 mg/dL  Glucose, capillary     Status: Abnormal   Collection Time: 01/12/23  3:55 PM  Result Value Ref Range   Glucose-Capillary 139 (H) 70 - 99 mg/dL  Glucose, capillary     Status: Abnormal   Collection Time: 01/12/23  8:14 PM  Result Value Ref Range   Glucose-Capillary 56 (L) 70 - 99 mg/dL  Glucose, capillary     Status: Abnormal   Collection Time: 01/12/23  8:59 PM  Result Value Ref Range   Glucose-Capillary 69 (L) 70 - 99 mg/dL  Glucose, capillary     Status: Abnormal   Collection Time: 01/12/23 10:01 PM  Result Value Ref Range   Glucose-Capillary 57 (L) 70 - 99 mg/dL  Glucose, capillary     Status: Abnormal   Collection Time: 01/12/23 11:07 PM  Result Value Ref Range   Glucose-Capillary 129 (H) 70 - 99 mg/dL  Glucose, capillary     Status: Abnormal   Collection Time: 01/13/23  8:07 AM  Result Value Ref Range   Glucose-Capillary 118 (H) 70 - 99 mg/dL    ASSESSMENT: Cassandra [name] is a 89 y.o. female, 10 Days Post-Op s/p LEFT AMPUTATION BELOW KNEE  CV/Blood loss: Hgb 9.7 on 01/07/23. Hemodynamically stable  PLAN: Weightbearing: NWB LLE ROM: Okay for hip and knee range of motion as tolerated Incisional and dressing care: Okay to leave incision open to air.  Change dressing as needed Showering: Okay to allow incision to get wet when showering Orthopedic device(s): Stump shrinker LLE Pain management:  1.  Tylenol 325-650 mg q 6 hours PRN 2. Robaxin 500 mg q 6 hours PRN 3. Norco 5-325 OR 7.5-325 mg q 4 hours PRN VTE prophylaxis:  Eliquis and Plavix , SCDs ID:  Ancef 2gm post op completed Foley/Lines:  No foley, KVO IVFs Impediments to Fracture Healing: Vitamin D level 39, will start on low dose supplementation Dispo: PT/OT evaluation ongoing, currently recommending CIR. insurance authorization denied.  Now approved.  Okay for discharge once bed available.  Patient will require suture removal LLE around 01/17/2023  D/C recommendations: -Norco 5-325 mg for pain control -Continue home dose Plavix and Eliquis for DVT prophylaxis -Continue 1,000 units Vit D supplementation  Follow - up plan: 2 weeks after d/c for wound check    Contact information:  Truitt Merle MD, Thyra Breed PA-C. After hours and holidays please check [email or web address] for group call information for [facility]

## 2023-01-13 NOTE — Progress Notes (Signed)
Physical Therapy Treatment Patient Details Name: Cassandra [name] MRN: [id number] DOB: 29-Aug-1933 Today's Date: 01/13/2023   History of Present Illness 89 yo female with onset of failed management of osteomyelitis was admitted 5/3 and had BKA on 5/4.  PMHx:  T2DM, CAD, sleep apnea, hx of breast cancer, hx of MI, neuromuscular disorder, COPD, hx of visual hallucinations, CKD, HTN    PT Comments    Continuing work on functional mobility and activity tolerance;  Pt pleasantly confused today, leaning towrads attributing this to pain medications; Still, making an excellent effort to attend to PT and OT, and responds appropriately to caregivers; original goals of session were transfers and more intensive therex, however adjusted goals to close monitor of mental status, safety, and activity tolerance with EOB activities and standing; Pt able to verbalize that she was dizzy standing EOB, and we opted to sit and get a BP (quite low at 71/43), and assisted pt back to supine; Notified Tameshia, LPN, Sarah, PA, and Dr. Jena Gauss of pt status; Patient will benefit from intensive inpatient follow up therapy, >3 hours/day   Recommendations for follow up therapy are one component of a multi-disciplinary discharge planning process, led by the attending physician.  Recommendations may be updated based on patient status, additional functional criteria and insurance authorization.  Follow Up Recommendations       Assistance Recommended at Discharge Frequent or constant Supervision/Assistance  Patient can return home with the following A little help with walking and/or transfers;A little help with bathing/dressing/bathroom;Assistance with cooking/housework;Assist for transportation;Help with stairs or ramp for entrance   Equipment Recommendations  Wheelchair (measurements PT);Wheelchair cushion (measurements PT);Other (comment)    Recommendations for Other Services       Precautions / Restrictions  Precautions Precautions: Fall Precaution Comments: watch BP; drop in sitting EOB today's session Restrictions LLE Weight Bearing: Non weight bearing     Mobility  Bed Mobility Overal bed mobility: Needs Assistance Bed Mobility: Supine to Sit     Supine to sit: Min assist Sit to supine: Min assist, +2 for safety/equipment   General bed mobility comments: Once pt was up sitting EOB, needed consistent cueing to scoot to EOB and get R foot to the floor; Assisted back to bed relatively quickly due to BP drop with upright activity    Transfers Overall transfer level: Needs assistance Equipment used: Rolling walker (2 wheels) Transfers: Sit to/from Stand Sit to Stand: Mod assist, +2 physical assistance, +2 safety/equipment           General transfer comment: Cues for hand placement and light mod assist to power up and steady; reported dizziness in standing, and opted to get back to bed    Ambulation/Gait                   Stairs             Wheelchair Mobility    Modified Rankin (Stroke Patients Only)       Balance     Sitting balance-Leahy Scale: Fair       Standing balance-Leahy Scale: Poor Standing balance comment: heavy reliance on UE support with cues for anterior weight shift as pt bracing RLE on bed                            Cognition Arousal/Alertness: Awake/alert Behavior During Therapy: WFL for tasks assessed/performed (Pleasantly confused) Overall Cognitive Status: Impaired/Different from baseline Area of Impairment: Awareness, Orientation, Memory, Safety/judgement,  Problem solving                         Safety/Judgement: Decreased awareness of safety, Decreased awareness of deficits Awareness: Intellectual Problem Solving: Difficulty sequencing General Comments: Noting pt hallucinating, speaking with people who weren't present in the room; Occasionally talking about things entirely unrelated to the session, but  still following through with mobility and ADL tasks asked of her, including mimicking replacing nasal cannula for supplemental O2 (but no nasal cannula was in her hands) when discussing restarting O2 with her nurse        Exercises      General Comments General comments (skin integrity, edema, etc.): BP sitting EOB 71/43 (49) and supine 148/58 (81); small amount of drainage at medial aspect of incision; Nurse notified      Pertinent Vitals/Pain Pain Assessment Pain Assessment: Faces Faces Pain Scale: Hurts even more Pain Location: L residual limb; startle/grimace with donning sock on residual limb Pain Descriptors / Indicators: Guarding, Discomfort, Grimacing Pain Intervention(s): Monitored during session, Premedicated before session    Home Living                          Prior Function            PT Goals (current goals can now be found in the care plan section) Acute Rehab PT Goals Patient Stated Goal: Did not specify a goal today, but agreeable to working on getting up PT Goal Formulation: With patient Time For Goal Achievement: 01/18/23 Potential to Achieve Goals: Good Progress towards PT goals: Progressing toward goals    Frequency    Min 3X/week      PT Plan Current plan remains appropriate    Co-evaluation PT/OT/SLP Co-Evaluation/Treatment: Yes Reason for Co-Treatment: To address functional/ADL transfers;Necessary to address cognition/behavior during functional activity PT goals addressed during session: Mobility/safety with mobility        AM-PAC PT "6 Clicks" Mobility   Outcome Measure  Help needed turning from your back to your side while in a flat bed without using bedrails?: None Help needed moving from lying on your back to sitting on the side of a flat bed without using bedrails?: A Little Help needed moving to and from a bed to a chair (including a wheelchair)?: A Lot Help needed standing up from a chair using your arms (e.g.,  wheelchair or bedside chair)?: A Little Help needed to walk in hospital room?: Total Help needed climbing 3-5 steps with a railing? : Total 6 Click Score: 14    End of Session Equipment Utilized During Treatment: Gait belt;Oxygen Activity Tolerance: Other (comment) (BP drop with upright activity) Patient left: in bed;with call bell/phone within reach;with bed alarm set Nurse Communication: Mobility status PT Visit Diagnosis: Unsteadiness on feet (R26.81);Muscle weakness (generalized) (M62.81);Difficulty in walking, not elsewhere classified (R26.2)     Time: 1113-1140 PT Time Calculation (min) (ACUTE ONLY): 27 min  Charges:  $Therapeutic Activity: 8-22 mins                     Van Clines, PT  Acute Rehabilitation Services Office [phone]    Levi Aland 01/13/2023, 2:48 PM

## 2023-01-13 NOTE — Progress Notes (Signed)
Hypoglycemic Event  CBG: 56  Treatment:  4oz orange juice x2  Symptoms: None  Follow-up CBG: Time:2059 CBG Result:69  Possible Reasons for Event: Inadequate meal intake  Comments/MD notified: Initial recheck bg=69. Pt was given 4oz grape juice. BG decreased to 57. Unable to give dextrose due to infiltrated IV. Pt was given another 4oz orange juice. BG increased to 129  after IV was placed therefore pt did not need dextrose.  Mcclung, PA-C was notified and scheduled Semglee was held as ordered.    Cheree Ditto, Shirley Muscat

## 2023-01-13 NOTE — Inpatient Diabetes Management (Signed)
Inpatient Diabetes Program Recommendations  AACE/ADA: New Consensus Statement on Inpatient Glycemic Control (2015)  Target Ranges:  Prepandial:   less than 140 mg/dL      Peak postprandial:   less than 180 mg/dL (1-2 hours)      Critically ill patients:  140 - 180 mg/dL   Lab Results  Component Value Date   GLUCAP 118 (H) 01/13/2023   HGBA1C 7.0 (H) 05/03/2022    Review of Glycemic Control  Latest Reference Range & Units 01/12/23 07:52 01/12/23 11:16 01/12/23 15:55 01/12/23 20:14 01/12/23 20:59 01/12/23 22:01 01/12/23 23:07 01/13/23 08:07  Glucose-Capillary 70 - 99 mg/dL 96 098 (H) 119 (H) 56 (L) 69 (L) 57 (L) 129 (H) 118 (H)   Diabetes history: DM 2 Outpatient Diabetes medications: Metformin 500 mg bid, Novolog 4 units Daily coverage, Lantus 6 units qhs Current orders for Inpatient glycemic control:  Metformin 500 mg bid Novolog 4 units tid meal coverage Lantus 6 units qhs  Inpatient Diabetes Program Recommendations:    -  Reduce Novolog Correction to "very sensitive" 0-6 units tid + hs scale -  Reduce Semglee to 5 units  Thanks.  Christena Deem RN, MSN, BC-ADM Inpatient Diabetes Coordinator Team Pager [phone] (8a-5p)

## 2023-01-13 NOTE — Progress Notes (Signed)
Inpatient Rehab Admissions Coordinator:   I have insurance approval from [facility] Medicare for CIR admission, but I do not have a bed for this patient today.  Will continue to follow for admit pending bed availability, hopefully in the next 1-2 days.   Estill Dooms, PT, DPT Admissions Coordinator [phone] 01/13/23  9:34 AM
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
## 2023-01-14 ENCOUNTER — Inpatient Hospital Stay (HOSPITAL_COMMUNITY)
Admission: RE | Admit: 2023-01-14 | Discharge: 2023-01-31 | DRG: 560 | Disposition: A | Discharge status: Home Health | Payer: Medicare Other | Source: Intra-hospital | Attending: Physical Medicine and Rehabilitation | Admitting: Physical Medicine and Rehabilitation

## 2023-01-14 DIAGNOSIS — F0670 Mild neurocognitive disorder due to known physiological condition without behavioral disturbance: Secondary | ICD-10-CM | POA: Diagnosis not present

## 2023-01-14 DIAGNOSIS — E039 Hypothyroidism, unspecified: Secondary | ICD-10-CM | POA: Diagnosis present

## 2023-01-14 DIAGNOSIS — I6782 Cerebral ischemia: Secondary | ICD-10-CM | POA: Diagnosis not present

## 2023-01-14 DIAGNOSIS — Z7984 Long term (current) use of oral hypoglycemic drugs: Secondary | ICD-10-CM

## 2023-01-14 DIAGNOSIS — G2581 Restless legs syndrome: Secondary | ICD-10-CM | POA: Diagnosis present

## 2023-01-14 DIAGNOSIS — E1122 Type 2 diabetes mellitus with diabetic chronic kidney disease: Secondary | ICD-10-CM | POA: Diagnosis present

## 2023-01-14 DIAGNOSIS — Z79890 Hormone replacement therapy (postmenopausal): Secondary | ICD-10-CM

## 2023-01-14 DIAGNOSIS — E11649 Type 2 diabetes mellitus with hypoglycemia without coma: Secondary | ICD-10-CM | POA: Diagnosis not present

## 2023-01-14 DIAGNOSIS — Z79899 Other long term (current) drug therapy: Secondary | ICD-10-CM

## 2023-01-14 DIAGNOSIS — S066XAS Traumatic subarachnoid hemorrhage with loss of consciousness status unknown, sequela: Secondary | ICD-10-CM

## 2023-01-14 DIAGNOSIS — F32A Depression, unspecified: Secondary | ICD-10-CM | POA: Diagnosis present

## 2023-01-14 DIAGNOSIS — E7800 Pure hypercholesterolemia, unspecified: Secondary | ICD-10-CM | POA: Diagnosis present

## 2023-01-14 DIAGNOSIS — E1142 Type 2 diabetes mellitus with diabetic polyneuropathy: Secondary | ICD-10-CM | POA: Diagnosis present

## 2023-01-14 DIAGNOSIS — G546 Phantom limb syndrome with pain: Secondary | ICD-10-CM | POA: Diagnosis not present

## 2023-01-14 DIAGNOSIS — D72829 Elevated white blood cell count, unspecified: Secondary | ICD-10-CM | POA: Diagnosis present

## 2023-01-14 DIAGNOSIS — E1169 Type 2 diabetes mellitus with other specified complication: Secondary | ICD-10-CM | POA: Diagnosis present

## 2023-01-14 DIAGNOSIS — E871 Hypo-osmolality and hyponatremia: Secondary | ICD-10-CM | POA: Diagnosis present

## 2023-01-14 DIAGNOSIS — L98499 Non-pressure chronic ulcer of skin of other sites with unspecified severity: Secondary | ICD-10-CM | POA: Diagnosis present

## 2023-01-14 DIAGNOSIS — D62 Acute posthemorrhagic anemia: Secondary | ICD-10-CM | POA: Diagnosis present

## 2023-01-14 DIAGNOSIS — N183 Chronic kidney disease, stage 3 unspecified: Secondary | ICD-10-CM | POA: Diagnosis present

## 2023-01-14 DIAGNOSIS — I130 Hypertensive heart and chronic kidney disease with heart failure and stage 1 through stage 4 chronic kidney disease, or unspecified chronic kidney disease: Secondary | ICD-10-CM | POA: Diagnosis present

## 2023-01-14 DIAGNOSIS — M86672 Other chronic osteomyelitis, left ankle and foot: Secondary | ICD-10-CM | POA: Diagnosis not present

## 2023-01-14 DIAGNOSIS — Z4781 Encounter for orthopedic aftercare following surgical amputation: Principal | ICD-10-CM

## 2023-01-14 DIAGNOSIS — K551 Chronic vascular disorders of intestine: Secondary | ICD-10-CM | POA: Diagnosis present

## 2023-01-14 DIAGNOSIS — I10 Essential (primary) hypertension: Secondary | ICD-10-CM | POA: Diagnosis not present

## 2023-01-14 DIAGNOSIS — J449 Chronic obstructive pulmonary disease, unspecified: Secondary | ICD-10-CM | POA: Diagnosis present

## 2023-01-14 DIAGNOSIS — M869 Osteomyelitis, unspecified: Secondary | ICD-10-CM | POA: Diagnosis present

## 2023-01-14 DIAGNOSIS — Z89512 Acquired absence of left leg below knee: Secondary | ICD-10-CM | POA: Diagnosis not present

## 2023-01-14 DIAGNOSIS — Z9011 Acquired absence of right breast and nipple: Secondary | ICD-10-CM

## 2023-01-14 DIAGNOSIS — Z950 Presence of cardiac pacemaker: Secondary | ICD-10-CM

## 2023-01-14 DIAGNOSIS — E8809 Other disorders of plasma-protein metabolism, not elsewhere classified: Secondary | ICD-10-CM | POA: Diagnosis present

## 2023-01-14 DIAGNOSIS — R443 Hallucinations, unspecified: Secondary | ICD-10-CM | POA: Diagnosis present

## 2023-01-14 DIAGNOSIS — I252 Old myocardial infarction: Secondary | ICD-10-CM

## 2023-01-14 DIAGNOSIS — Z85828 Personal history of other malignant neoplasm of skin: Secondary | ICD-10-CM

## 2023-01-14 DIAGNOSIS — Z87442 Personal history of urinary calculi: Secondary | ICD-10-CM

## 2023-01-14 DIAGNOSIS — Z87891 Personal history of nicotine dependence: Secondary | ICD-10-CM

## 2023-01-14 DIAGNOSIS — Z794 Long term (current) use of insulin: Secondary | ICD-10-CM

## 2023-01-14 DIAGNOSIS — G4700 Insomnia, unspecified: Secondary | ICD-10-CM | POA: Diagnosis present

## 2023-01-14 DIAGNOSIS — Z7902 Long term (current) use of antithrombotics/antiplatelets: Secondary | ICD-10-CM

## 2023-01-14 DIAGNOSIS — I951 Orthostatic hypotension: Secondary | ICD-10-CM | POA: Diagnosis present

## 2023-01-14 DIAGNOSIS — T3995XA Adverse effect of unspecified nonopioid analgesic, antipyretic and antirheumatic, initial encounter: Secondary | ICD-10-CM | POA: Diagnosis present

## 2023-01-14 DIAGNOSIS — R410 Disorientation, unspecified: Secondary | ICD-10-CM | POA: Diagnosis present

## 2023-01-14 DIAGNOSIS — Z853 Personal history of malignant neoplasm of breast: Secondary | ICD-10-CM

## 2023-01-14 DIAGNOSIS — I5032 Chronic diastolic (congestive) heart failure: Secondary | ICD-10-CM | POA: Diagnosis present

## 2023-01-14 DIAGNOSIS — Z7901 Long term (current) use of anticoagulants: Secondary | ICD-10-CM

## 2023-01-14 DIAGNOSIS — G4730 Sleep apnea, unspecified: Secondary | ICD-10-CM | POA: Diagnosis present

## 2023-01-14 DIAGNOSIS — Z955 Presence of coronary angioplasty implant and graft: Secondary | ICD-10-CM

## 2023-01-14 DIAGNOSIS — Z981 Arthrodesis status: Secondary | ICD-10-CM

## 2023-01-14 DIAGNOSIS — I2510 Atherosclerotic heart disease of native coronary artery without angina pectoris: Secondary | ICD-10-CM | POA: Diagnosis present

## 2023-01-14 LAB — CBC
HCT: 28.8 % — ABNORMAL LOW (ref 36.0–46.0)
Hemoglobin: 9.1 g/dL — ABNORMAL LOW (ref 12.0–15.0)
MCH: 27.6 pg (ref 26.0–34.0)
MCHC: 31.6 g/dL (ref 30.0–36.0)
MCV: 87.3 fL (ref 80.0–100.0)
Platelets: 498 10*3/uL — ABNORMAL HIGH (ref 150–400)
RBC: 3.3 MIL/uL — ABNORMAL LOW (ref 3.87–5.11)
RDW: 16.3 % — ABNORMAL HIGH (ref 11.5–15.5)
WBC: 17.9 10*3/uL — ABNORMAL HIGH (ref 4.0–10.5)
nRBC: 0 % (ref 0.0–0.2)

## 2023-01-14 LAB — GLUCOSE, CAPILLARY
Glucose-Capillary: 144 mg/dL — ABNORMAL HIGH (ref 70–99)
Glucose-Capillary: 159 mg/dL — ABNORMAL HIGH (ref 70–99)
Glucose-Capillary: 145 mg/dL — ABNORMAL HIGH (ref 70–99)
Glucose-Capillary: 104 mg/dL — ABNORMAL HIGH (ref 70–99)

## 2023-01-14 MED ORDER — VITAMIN D 25 MCG (1000 UNIT) PO TABS
1000.0000 [IU] | ORAL_TABLET | Freq: Every day | ORAL | Status: DC
  Administered 2023-01-15 – 2023-01-31 (×17): 1000 [IU] via ORAL
  Filled 2023-01-14 (×17): qty 1

## 2023-01-14 MED ORDER — METFORMIN HCL 500 MG PO TABS
500.0000 mg | ORAL_TABLET | Freq: Two times a day (BID) | ORAL | Status: DC
  Administered 2023-01-14 – 2023-01-21 (×15): 500 mg via ORAL
  Filled 2023-01-14 (×15): qty 1

## 2023-01-14 MED ORDER — APIXABAN 2.5 MG PO TABS
2.5000 mg | ORAL_TABLET | Freq: Two times a day (BID) | ORAL | Status: DC
  Administered 2023-01-14 – 2023-01-31 (×34): 2.5 mg via ORAL
  Filled 2023-01-14 (×34): qty 1

## 2023-01-14 MED ORDER — IRBESARTAN 75 MG PO TABS
75.0000 mg | ORAL_TABLET | Freq: Every day | ORAL | Status: DC
  Administered 2023-01-15 – 2023-01-19 (×5): 75 mg via ORAL
  Filled 2023-01-14 (×5): qty 1

## 2023-01-14 MED ORDER — ONDANSETRON HCL 4 MG PO TABS: 4.0000 mg | ORAL_TABLET | Freq: Four times a day (QID) | ORAL | Status: DC | PRN

## 2023-01-14 MED ORDER — TORSEMIDE 20 MG PO TABS
20.0000 mg | ORAL_TABLET | Freq: Every day | ORAL | Status: DC
  Administered 2023-01-15 – 2023-01-22 (×8): 20 mg via ORAL
  Filled 2023-01-14 (×8): qty 1

## 2023-01-14 MED ORDER — IRBESARTAN 150 MG PO TABS: 75.0000 mg | ORAL_TABLET | Freq: Every day | ORAL | Status: DC

## 2023-01-14 MED ORDER — METHOCARBAMOL 1000 MG/10ML IJ SOLN: 500.0000 mg | Freq: Four times a day (QID) | INTRAVENOUS | Status: DC | PRN

## 2023-01-14 MED ORDER — MELATONIN 5 MG PO TABS
10.0000 mg | ORAL_TABLET | Freq: Every evening | ORAL | Status: DC | PRN
  Administered 2023-01-16 – 2023-01-21 (×2): 10 mg via ORAL
  Filled 2023-01-14 (×3): qty 2

## 2023-01-14 MED ORDER — ONDANSETRON HCL 4 MG/2ML IJ SOLN: 4.0000 mg | Freq: Four times a day (QID) | INTRAMUSCULAR | Status: DC | PRN

## 2023-01-14 MED ORDER — ROSUVASTATIN CALCIUM 5 MG PO TABS
10.0000 mg | ORAL_TABLET | Freq: Every day | ORAL | Status: DC
  Administered 2023-01-14 – 2023-01-30 (×17): 10 mg via ORAL
  Filled 2023-01-14 (×17): qty 2

## 2023-01-14 MED ORDER — AMITRIPTYLINE HCL 10 MG PO TABS
10.0000 mg | ORAL_TABLET | Freq: Every day | ORAL | Status: DC
  Administered 2023-01-14 – 2023-01-16 (×3): 10 mg via ORAL
  Filled 2023-01-14 (×3): qty 1

## 2023-01-14 MED ORDER — DOCUSATE SODIUM 100 MG PO CAPS
100.0000 mg | ORAL_CAPSULE | Freq: Two times a day (BID) | ORAL | Status: DC
  Administered 2023-01-14 – 2023-01-31 (×34): 100 mg via ORAL
  Filled 2023-01-14 (×34): qty 1

## 2023-01-14 MED ORDER — CLOPIDOGREL BISULFATE 75 MG PO TABS
75.0000 mg | ORAL_TABLET | Freq: Every day | ORAL | Status: DC
  Administered 2023-01-15 – 2023-01-31 (×17): 75 mg via ORAL
  Filled 2023-01-14 (×17): qty 1

## 2023-01-14 MED ORDER — VENLAFAXINE HCL ER 150 MG PO CP24
150.0000 mg | ORAL_CAPSULE | Freq: Every day | ORAL | Status: DC
  Administered 2023-01-15 – 2023-01-31 (×17): 150 mg via ORAL
  Filled 2023-01-14 (×17): qty 1

## 2023-01-14 MED ORDER — POLYETHYLENE GLYCOL 3350 17 G PO PACK
17.0000 g | PACK | Freq: Every day | ORAL | Status: DC | PRN
  Administered 2023-01-20: 17 g via ORAL
  Filled 2023-01-14: qty 1

## 2023-01-14 MED ORDER — METOPROLOL TARTRATE 12.5 MG HALF TABLET
12.5000 mg | ORAL_TABLET | Freq: Two times a day (BID) | ORAL | Status: DC
  Administered 2023-01-14 – 2023-01-20 (×13): 12.5 mg via ORAL
  Filled 2023-01-14 (×14): qty 1

## 2023-01-14 MED ORDER — METHOCARBAMOL 500 MG PO TABS
500.0000 mg | ORAL_TABLET | Freq: Four times a day (QID) | ORAL | Status: DC | PRN
  Administered 2023-01-15 – 2023-01-30 (×7): 500 mg via ORAL
  Filled 2023-01-14 (×7): qty 1

## 2023-01-14 MED ORDER — TRAMADOL HCL 50 MG PO TABS
50.0000 mg | ORAL_TABLET | Freq: Four times a day (QID) | ORAL | Status: DC | PRN
  Administered 2023-01-15 – 2023-01-18 (×4): 50 mg via ORAL
  Administered 2023-01-19: 100 mg via ORAL
  Administered 2023-01-20: 50 mg via ORAL
  Administered 2023-01-21 – 2023-01-23 (×4): 100 mg via ORAL
  Filled 2023-01-14 (×3): qty 1
  Filled 2023-01-14: qty 2
  Filled 2023-01-14: qty 1
  Filled 2023-01-14 (×3): qty 2
  Filled 2023-01-14: qty 1
  Filled 2023-01-14 (×3): qty 2

## 2023-01-14 MED ORDER — METOPROLOL TARTRATE 12.5 MG HALF TABLET: 12.5000 mg | ORAL_TABLET | Freq: Two times a day (BID) | ORAL | Status: DC

## 2023-01-14 MED ORDER — ACETAMINOPHEN 325 MG PO TABS
325.0000 mg | ORAL_TABLET | Freq: Four times a day (QID) | ORAL | Status: DC | PRN
  Administered 2023-01-15 – 2023-01-25 (×6): 650 mg via ORAL
  Filled 2023-01-14 (×7): qty 2

## 2023-01-14 MED ORDER — HYDROXYZINE HCL 10 MG PO TABS
10.0000 mg | ORAL_TABLET | Freq: Three times a day (TID) | ORAL | Status: DC | PRN
  Filled 2023-01-14: qty 1

## 2023-01-14 MED ORDER — INSULIN ASPART 100 UNIT/ML IJ SOLN: 0.0000 [IU] | Freq: Three times a day (TID) | INTRAMUSCULAR | Status: DC

## 2023-01-14 MED ORDER — INSULIN GLARGINE-YFGN 100 UNIT/ML ~~LOC~~ SOLN
5.0000 [IU] | Freq: Every day | SUBCUTANEOUS | Status: DC
  Administered 2023-01-14 – 2023-01-20 (×7): 5 [IU] via SUBCUTANEOUS
  Filled 2023-01-14 (×8): qty 0.05

## 2023-01-14 MED ORDER — LEVOTHYROXINE SODIUM 25 MCG PO TABS
125.0000 ug | ORAL_TABLET | Freq: Every day | ORAL | Status: DC
  Administered 2023-01-15 – 2023-01-31 (×17): 125 ug via ORAL
  Filled 2023-01-14 (×18): qty 1

## 2023-01-14 MED ORDER — TRAZODONE HCL 50 MG PO TABS
50.0000 mg | ORAL_TABLET | Freq: Every day | ORAL | Status: DC
  Administered 2023-01-14: 50 mg via ORAL
  Filled 2023-01-14: qty 1

## 2023-01-14 NOTE — Progress Notes (Signed)
Orthopaedic Trauma Progress Note  SUBJECTIVE: Patient doing okay this AM.  Currently eating her breakfast.  No further episodes of confusion noted once pain medication switched to tramadol yesterday.  Patient has tolerated tramadol well in the past.  Per diabetes coordinator and pharmacy recommendations, I have decreased SSI and adjusted home dose Eliquis dose.  CIR has been approved, awaiting bed placement.  OBJECTIVE:  Vitals:   01/14/23 0353 01/14/23 0809  BP:  (!) 140/72  Pulse: 66   Resp: 14 16  Temp: 98 F (36.7 C)   SpO2: 97%     LABS:  Results for orders placed or performed during the hospital encounter of 01/03/23 (from the past 24 hour(s))  Glucose, capillary     Status: Abnormal   Collection Time: 01/13/23 11:41 AM  Result Value Ref Range   Glucose-Capillary 145 (H) 70 - 99 mg/dL  Glucose, capillary     Status: Abnormal   Collection Time: 01/13/23  4:42 PM  Result Value Ref Range   Glucose-Capillary 140 (H) 70 - 99 mg/dL  Glucose, capillary     Status: None   Collection Time: 01/13/23  8:13 PM  Result Value Ref Range   Glucose-Capillary 87 70 - 99 mg/dL  Glucose, capillary     Status: Abnormal   Collection Time: 01/14/23  8:06 AM  Result Value Ref Range   Glucose-Capillary 144 (H) 70 - 99 mg/dL    ASSESSMENT: SHERREN LEMOS is a 89 y.o. female, 11 Days Post-Op s/p LEFT AMPUTATION BELOW KNEE  CV/Blood loss: Hgb 9.7 on 01/07/23. Hemodynamically stable  PLAN: Weightbearing: NWB LLE ROM: Okay for hip and knee range of motion as tolerated Incisional and dressing care: Okay to leave incision open to air.  Change dressing as needed Showering: Okay to allow incision to get wet when showering Orthopedic device(s): Stump shrinker LLE Pain management:  1. Tylenol 325-650 mg q 6 hours PRN 2. Robaxin 500 mg q 6 hours PRN 3.  Tramadol 50-100 mg q 6 hours PRN VTE prophylaxis:  Eliquis and Plavix , SCDs ID:  Ancef 2gm post op completed Foley/Lines:  No foley, KVO  IVFs Impediments to Fracture Healing: Vitamin D level 39, continue on low dose supplementation Dispo: PT/OT evaluation ongoing, currently recommending CIR.  Insurance authorization has not been approved.  Okay for discharge once bed available.  Patient will require suture removal LLE around 01/17/2023  D/C recommendations: -Tramadol 50-100 mg for pain control -Continue home dose Plavix and Eliquis for DVT prophylaxis -Continue 1,000 units Vit D supplementation  Follow - up plan: 2 weeks after d/c for wound check    Contact information:  Truitt Merle MD, Thyra Breed PA-C. After hours and holidays please check [email or web address] for group call information for [facility]

## 2023-01-14 NOTE — Progress Notes (Signed)
Occupational Therapy Treatment Patient Details Name: Cassandra [name] MRN: [id number] DOB: 1933-08-05 Today's Date: 01/14/2023   History of present illness 89 yo female with onset of failed management of osteomyelitis was admitted 5/3 and had BKA on 5/4.  PMHx:  T2DM, CAD, sleep apnea, hx of breast cancer, hx of MI, neuromuscular disorder, COPD, hx of visual hallucinations, CKD, HTN   OT comments  Upon arrival, pt supine in bed, awake, and with husband at bedside. Pt agreeable to therapy and donning pants with Mod A at bed level using bridge positioning to pull over hips. Pt requiring Min A +2 for bed mobility and performing sit<>stand with Mod A +2 and RW. Pt reporting dizziness in standing and requesting to lay back down. BP dropping; notified RN and MD. See general comments. Continue to recommend dc to post acute rehab and will continue to follow acutely as admitted.    Recommendations for follow up therapy are one component of a multi-disciplinary discharge planning process, led by the attending physician.  Recommendations may be updated based on patient status, additional functional criteria and insurance authorization.    Assistance Recommended at Discharge Frequent or constant Supervision/Assistance  Patient can return home with the following  A lot of help with walking and/or transfers;A lot of help with bathing/dressing/bathroom   Equipment Recommendations  None recommended by OT    Recommendations for Other Services      Precautions / Restrictions Precautions Precautions: Fall Precaution Comments: watch BP; drop in sitting EOB Restrictions Weight Bearing Restrictions: Yes LLE Weight Bearing: Non weight bearing       Mobility Bed Mobility Overal bed mobility: Needs Assistance Bed Mobility: Supine to Sit     Supine to sit: Min assist Sit to supine: Min assist, +2 for safety/equipment   General bed mobility comments: Mi nA for managing hips    Transfers Overall  transfer level: Needs assistance Equipment used: Rolling walker (2 wheels) Transfers: Sit to/from Stand Sit to Stand: Mod assist, +2 physical assistance, +2 safety/equipment           General transfer comment: Mod A +2 for power up into standing. R knee buckling; blocking thorughout     Balance Overall balance assessment: Needs assistance Sitting-balance support: Single extremity supported Sitting balance-Leahy Scale: Fair Sitting balance - Comments: sitting EOB. ABle to maintain static sitting balance without assist. Postural control: Posterior lean Standing balance support: Bilateral upper extremity supported, During functional activity Standing balance-Leahy Scale: Poor Standing balance comment: heavy reliance on UE support with cues for anterior weight shift as pt bracing RLE on bed                           ADL either performed or assessed with clinical judgement   ADL Overall ADL's : Needs assistance/impaired                     Lower Body Dressing: Moderate assistance;Bed level Lower Body Dressing Details (indicate cue type and reason): Donning pants over RLE and then residual L limb. Pt able to bridge and lift pants over hips with mutiple attempts.             Functional mobility during ADLs: Maximal assistance;+2 for safety/equipment (sit<>stand) General ADL Comments: Pt donning pants and then performing sit<>stand at EOB. Limited by BP and requesting to lay back down due to dizziness    Extremity/Trunk Assessment Upper Extremity Assessment Upper Extremity Assessment: Overall [facility] for  tasks assessed   Lower Extremity Assessment Lower Extremity Assessment: Defer to PT evaluation LLE Deficits / Details: s/p BKA        Vision       Perception     Praxis      Cognition Arousal/Alertness: Awake/alert, Suspect due to medications Behavior During Therapy: WFL for tasks assessed/performed (Pleasantly confused) Overall Cognitive Status:  Impaired/Different from baseline Area of Impairment: Awareness, Orientation, Memory, Safety/judgement, Problem solving                         Safety/Judgement: Decreased awareness of safety, Decreased awareness of deficits Awareness: Intellectual Problem Solving: Difficulty sequencing General Comments: More alert this session. Continues to present wiht confusion and poor awareness.        Exercises      Shoulder Instructions       General Comments BP supine 133/62, sitting EOB 129/74, after standing 105/93, supine 171/61, sitting with HOB elevated 118/70. Notified RN and MD    Pertinent Vitals/ Pain       Pain Assessment Pain Assessment: Faces Faces Pain Scale: Hurts even more Breathing: normal Pain Location: L residual limb; startle/grimace with donning sock on residual limb Pain Descriptors / Indicators: Guarding, Discomfort, Grimacing Pain Intervention(s): Monitored during session, Limited activity within patient's tolerance, Repositioned  Home Living                                          Prior Functioning/Environment              Frequency  Min 3X/week        Progress Toward Goals  OT Goals(current goals can now be found in the care plan section)  Progress towards OT goals: Progressing toward goals  Acute Rehab OT Goals OT Goal Formulation: With patient Time For Goal Achievement: 01/19/23 Potential to Achieve Goals: Good ADL Goals Pt Will Perform Upper Body Dressing: with set-up;with supervision;sitting Pt Will Perform Lower Body Dressing: with min guard assist;sitting/lateral leans;bed level Pt Will Transfer to Toilet: with min assist;with transfer board;stand pivot transfer;bedside commode Pt Will Perform Toileting - Clothing Manipulation and hygiene: with supervision;sitting/lateral leans  Plan Discharge plan remains appropriate;Frequency remains appropriate    Co-evaluation    PT/OT/SLP Co-Evaluation/Treatment:  Yes Reason for Co-Treatment: To address functional/ADL transfers;Necessary to address cognition/behavior during functional activity (To address BP)   OT goals addressed during session: ADL's and self-care;Strengthening/ROM      AM-PAC OT "6 Clicks" Daily Activity     Outcome Measure   Help from another person eating meals?: None Help from another person taking care of personal grooming?: A Little Help from another person toileting, which includes using toliet, bedpan, or urinal?: A Little Help from another person bathing (including washing, rinsing, drying)?: A Lot Help from another person to put on and taking off regular upper body clothing?: A Little Help from another person to put on and taking off regular lower body clothing?: A Lot 6 Click Score: 17    End of Session Equipment Utilized During Treatment: Rolling walker (2 wheels);Gait belt  OT Visit Diagnosis: Unsteadiness on feet (R26.81);Other abnormalities of gait and mobility (R26.89);Muscle weakness (generalized) (M62.81);Pain Pain - Right/Left: Left Pain - part of body: Leg   Activity Tolerance Patient tolerated treatment well   Patient Left in bed;with call bell/phone within reach;with bed alarm set;with family/visitor present  Nurse Communication Mobility status        Time: 1325-1403 OT Time Calculation (min): 38 min  Charges: OT General Charges $OT Visit: 1 Visit OT Treatments $Self Care/Home Management : 23-37 mins  Abubakar Crispo MSOT, OTR/L Acute Rehab Office: [phone]  Theodoro Grist Jadalee Westcott 01/14/2023, 4:13 PM

## 2023-01-14 NOTE — Progress Notes (Signed)
Inpatient Rehabilitation Admission Medication Review by a Pharmacist  A complete drug regimen review was completed for this patient to identify any potential clinically significant medication issues.  High Risk Drug Classes Is patient taking? Indication by Medication  Antipsychotic No   Anticoagulant Yes Apixaban- AF  Antibiotic No   Opioid Yes Tramadol- acute post-op pain  Antiplatelet No Plavix-CAD   Hypoglycemics/insulin Yes Metformin, insulin- T2DM  Vasoactive Medication Yes Avapro, lopressor, demadex- HTN  Chemotherapy No   Other Yes Robaxin- muscle spasms Elavil- sleep Atarax- itching Synthroid- hypothyroidism Melatonin- sleep Crestor- HLD Trazodone- sleep Effexor- MDD     Type of Medication Issue Identified Description of Issue Recommendation(s)  Drug Interaction(s) (clinically significant)     Duplicate Therapy     Allergy     No Medication Administration End Date     Incorrect Dose     Additional Drug Therapy Needed     Significant med changes from prior encounter (inform family/care partners about these prior to discharge). BB and ARB doses decreased due to hypotension   Other       Clinically significant medication issues were identified that warrant physician communication and completion of prescribed/recommended actions by midnight of the next day:  No    Time spent performing this drug regimen review (minutes):  30   Loralee Pacas, PharmD, BCPS 01/14/2023 7:35 PM  Contact: [phone] after 3 PM
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
## 2023-01-14 NOTE — Progress Notes (Signed)
Family notified of discharge to CIR

## 2023-01-14 NOTE — Progress Notes (Signed)
Inpatient Rehabilitation  Patient information reviewed and entered into eRehab system by Ardeth Repetto M. Lex Linhares, M.A., CCC/SLP, PPS Coordinator.  Information including medical coding, functional ability and quality indicators will be reviewed and updated through discharge.    

## 2023-01-14 NOTE — Progress Notes (Signed)
Physical Therapy Treatment Patient Details Name: Cassandra [name] MRN: [id number] DOB: [birth year] Today's Date: 01/14/2023   History of Present Illness 89 yo female with onset of failed management of osteomyelitis was admitted 5/3 and had BKA on 5/4.  PMHx:  T2DM, CAD, sleep apnea, hx of breast cancer, hx of MI, neuromuscular disorder, COPD, hx of visual hallucinations, CKD, HTN    PT Comments    Upon arrival, pt supine in bed, awake, and with husband at bedside. Pt agreeable to therapy and donning pants with Mod A at bed level using bridge positioning to pull over hips. Pt requiring Min A +2 for bed mobility and performing sit<>stand with Mod A +2 and RW. Pt reporting dizziness in standing and requesting to lay back down. BP dropping; notified RN and MD. See general comments. Continue to recommend dc to post acute rehab and will continue to follow acutely as admitted.     Recommendations for follow up therapy are one component of a multi-disciplinary discharge planning process, led by the attending physician.  Recommendations may be updated based on patient status, additional functional criteria and insurance authorization.  Follow Up Recommendations       Assistance Recommended at Discharge Frequent or constant Supervision/Assistance  Patient can return home with the following A little help with walking and/or transfers;A little help with bathing/dressing/bathroom;Assistance with cooking/housework;Assist for transportation;Help with stairs or ramp for entrance   Equipment Recommendations  Wheelchair (measurements PT);Wheelchair cushion (measurements PT);Other (comment)    Recommendations for Other Services       Precautions / Restrictions Precautions Precautions: Fall Precaution Comments: watch BP; drop in sitting EOB Restrictions LLE Weight Bearing: Non weight bearing     Mobility  Bed Mobility Overal bed mobility: Needs Assistance Bed Mobility: Supine to Sit     Supine  to sit: Min assist Sit to supine: Min assist, +2 for safety/equipment   General bed mobility comments: Mi nA for managing hips    Transfers Overall transfer level: Needs assistance Equipment used: Rolling walker (2 wheels) Transfers: Sit to/from Stand Sit to Stand: Mod assist, +2 physical assistance, +2 safety/equipment           General transfer comment: Mod A +2 for power up into standing. R knee buckling; blocking thorughout    Ambulation/Gait                   Stairs             Wheelchair Mobility    Modified Rankin (Stroke Patients Only)       Balance Overall balance assessment: Needs assistance Sitting-balance support: Single extremity supported Sitting balance-Leahy Scale: Fair Sitting balance - Comments: sitting EOB. ABle to maintain static sitting balance without assist. Postural control: Posterior lean Standing balance support: Bilateral upper extremity supported, During functional activity Standing balance-Leahy Scale: Poor Standing balance comment: heavy reliance on UE support with cues for anterior weight shift as pt bracing RLE on bed                            Cognition Arousal/Alertness: Awake/alert, Suspect due to medications Behavior During Therapy: WFL for tasks assessed/performed (pleasantly confused) Overall Cognitive Status: Impaired/Different from baseline Area of Impairment: Awareness, Orientation, Memory, Safety/judgement, Problem solving                         Safety/Judgement: Decreased awareness of safety, Decreased awareness of deficits Awareness: Intellectual  Problem Solving: Difficulty sequencing General Comments: More alert this session. Continues to present wiht confusion and poor awareness.        Exercises Other Exercises Other Exercises: Bolstered bridging x10; lots of multimodal cues for form    General Comments General comments (skin integrity, edema, etc.): BP supine 133/62,  sitting EOB 129/74, after standing 105/93, supine 171/61, sitting with HOB elevated 118/70. Notified RN and MD      Pertinent Vitals/Pain Pain Assessment Pain Assessment: Faces Faces Pain Scale: Hurts even more Pain Location: L residual limb; startle/grimace with donning sock on residual limb Pain Descriptors / Indicators: Guarding, Discomfort, Grimacing Pain Intervention(s): Monitored during session    Home Living                          Prior Function            PT Goals (current goals can now be found in the care plan section) Acute Rehab PT Goals Patient Stated Goal: Did not specify a goal today, but agreeable to working on getting up PT Goal Formulation: With patient Time For Goal Achievement: 01/18/23 Potential to Achieve Goals: Good Progress towards PT goals: Progressing toward goals (slowly)    Frequency    Min 3X/week      PT Plan Current plan remains appropriate    Co-evaluation PT/OT/SLP Co-Evaluation/Treatment: Yes Reason for Co-Treatment: To address functional/ADL transfers;Necessary to address cognition/behavior during functional activity PT goals addressed during session: Mobility/safety with mobility        AM-PAC PT "6 Clicks" Mobility   Outcome Measure  Help needed turning from your back to your side while in a flat bed without using bedrails?: None Help needed moving from lying on your back to sitting on the side of a flat bed without using bedrails?: A Little Help needed moving to and from a bed to a chair (including a wheelchair)?: A Lot Help needed standing up from a chair using your arms (e.g., wheelchair or bedside chair)?: Total Help needed to walk in hospital room?: Total Help needed climbing 3-5 steps with a railing? : Total 6 Click Score: 12    End of Session Equipment Utilized During Treatment: Gait belt;Oxygen Activity Tolerance: Other (comment) (BP drop with upright activity) Patient left: in bed;with call bell/phone  within reach;with bed alarm set Nurse Communication: Mobility status PT Visit Diagnosis: Unsteadiness on feet (R26.81);Muscle weakness (generalized) (M62.81);Difficulty in walking, not elsewhere classified (R26.2)     Time: 1325-1403 PT Time Calculation (min) (ACUTE ONLY): 38 min  Charges:  $Therapeutic Activity: 8-22 mins                     Van Clines, PT  Acute Rehabilitation Services Office [phone]    Levi Aland 01/14/2023, 5:08 PM

## 2023-01-14 NOTE — Progress Notes (Signed)
Inpatient Rehab Admissions Coordinator:   I have a bed available for pt to admit to CIR today.  Approval from Thyra Breed, PA-C.  Will let pt/family and TOC know.    Estill Dooms, PT, DPT Admissions Coordinator [phone] 01/14/23  9:41 AM

## 2023-01-14 NOTE — Progress Notes (Signed)
Patient arrived from [facility] around 1600, appears alert, with no complaint mof pain.

## 2023-01-14 NOTE — Plan of Care (Signed)

## 2023-01-14 NOTE — Progress Notes (Signed)
Physical Medicine and Rehabilitation Consult Reason for Consult:Left BKA, impaired functional mobility Referring Physician: Haddix     HPI: Cassandra [name] is a 89 y.o. female with a hx of DM, prior left tib-fib fx in 2022 which had multiple complications including infection requiring long-term abx. Pt continued to have significant pain and swelling in her left lower ext and ultimately opted for left BKA which was performed on 01/03/23 by Dr. Jena Gauss. Her course was complicated by suspected oropharyngeal dysphagia. SLP did not pick up any abnl at bedside but pt had deep laryngeal penetration of thins on recent esophagram. SLP recommended small bites, slow rate, upright seating for meals. Plavix resumed initially and then eliquis today has hgb up to 9.7.  WBC's remain 13.6 Pt was up with PT yesterday and was mod assist for sit-std transfers. PT noted fear of falling during transfers and that the right knee was buckling.      Review of Systems  Constitutional:  Negative for fever.  HENT:  Negative for ear pain.   Eyes:  Negative for blurred vision.  Respiratory:  Negative for cough.   Cardiovascular:  Negative for chest pain.  Gastrointestinal:  Negative for heartburn.  Genitourinary:  Negative for dysuria.  Musculoskeletal:  Positive for myalgias.  Skin:  Negative for rash.  Neurological:  Positive for sensory change and focal weakness.  Psychiatric/Behavioral:  Negative for depression.         Past Medical History:  Diagnosis Date   Acquired hypothyroidism 01/06/2014   Anxiety     Arthritis     B12 deficiency 02/15/2014   Benign essential hypertension 01/06/2014   Breast cancer (HCC) 1982    Right breast cancer - chemotherapy   CAD (coronary artery disease), native coronary artery 01/06/2014   Carotid artery calcification     Chronic airway obstruction, not elsewhere classified 01/06/2014   Chronic mesenteric ischemia (HCC)      s/p SMA stent 11/03/19   Depression      Diabetes mellitus without complication (HCC)     Dysrhythmia      aflutter with RVR 06/2016   History of kidney stones 2019    left ureteral stone   Incontinence of urine     Myocardial infarction (HCC) 1982    small infarct   Neuromuscular disorder (HCC)      restless legs   Pacemaker     Presence of permanent cardiac pacemaker 07/2016   Pure hypercholesterolemia 01/06/2014   Renal insufficiency     Skin cancer     Sleep apnea      no longer uses a cpap. Uses oxygen as needed         Past Surgical History:  Procedure Laterality Date   AMPUTATION Left 01/03/2023    Procedure: AMPUTATION BELOW KNEE;  Surgeon: Roby Lofts, MD;  Location: [facility];  Service: Orthopedics;  Laterality: Left;   ANKLE FUSION Left 10/10/2021    Procedure: ANKLE FUSION;  Surgeon: Roby Lofts, MD;  Location: [facility];  Service: Orthopedics;  Laterality: Left;   APPENDECTOMY       APPLICATION OF WOUND VAC Left 02/24/2021    Procedure: APPLICATION OF WOUND VAC;  Surgeon: Samson Frederic, MD;  Location: [facility];  Service: Orthopedics;  Laterality: Left;   AUGMENTATION MAMMAPLASTY Bilateral 1982/redo in 2014    prior mastectomy   CARDIAC CATHETERIZATION N/A 04/23/2016    Procedure: Left Heart Cath and Coronary Angiography;  Surgeon:  Lamar Blinks, MD;  Location: [facility];  Service: Cardiovascular;  Laterality: N/A;   CARDIAC CATHETERIZATION Left 04/23/2016    Procedure: Coronary Stent Intervention;  Surgeon: Alwyn Pea, MD;  Location: [facility];  Service: Cardiovascular;  Laterality: Left;   CAROTID ARTERY ANGIOPLASTY Left 2010    had stent inserted and removed d/t infection. artery from leg inserted in left carotid   CAROTID ENDARTERECTOMY Left 2010    left CEA ~ 2010, s/p excision infected left carotid patch & pseudoaneurysm with left CCA-ICA bypass using SVG 05/20/12   CHOLECYSTECTOMY       CORONARY ANGIOPLASTY WITH STENT PLACEMENT   september 9th 2017   CYSTOSCOPY W/ URETERAL  STENT PLACEMENT Left 08/17/2017    Procedure: CYSTOSCOPY WITH RETROGRADE PYELOGRAM/URETERAL STENT PLACEMENT;  Surgeon: Jerilee Field, MD;  Location: [facility];  Service: Urology;  Laterality: Left;   CYSTOSCOPY W/ URETERAL STENT PLACEMENT Left 09/16/2017    Procedure: CYSTOSCOPY WITH STENT REPLACEMENT;  Surgeon: [name], MD;  Location: [facility];  Service: Urology;  Laterality: Left;   CYSTOSCOPY/RETROGRADE/URETEROSCOPY/STONE EXTRACTION WITH BASKET Left 09/16/2017    Procedure: CYSTOSCOPY/RETROGRADE/URETEROSCOPY/STONE EXTRACTION WITH BASKET;  Surgeon: [name], MD;  Location: [facility];  Service: Urology;  Laterality: Left;   EXTERNAL FIXATION LEG Left 02/24/2021    Procedure: EXTERNAL FIXATION  ANKLE;  Surgeon: Samson Frederic, MD;  Location: [facility];  Service: Orthopedics;  Laterality: Left;   EXTERNAL FIXATION REMOVAL Left 02/26/2021    Procedure: REMOVAL EXTERNAL FIXATION LEG;  Surgeon: Roby Lofts, MD;  Location: [facility];  Service: Orthopedics;  Laterality: Left;   EYE SURGERY Bilateral      cataract extraction   I & D EXTREMITY Left 02/24/2021    Procedure: IRRIGATION AND DEBRIDEMENT EXTREMITY;  Surgeon: Samson Frederic, MD;  Location: [facility];  Service: Orthopedics;  Laterality: Left;   I & D EXTREMITY Left 12/21/2021    Procedure: IRRIGATION AND DEBRIDEMENT LEFT ANKLE;  Surgeon: Roby Lofts, MD;  Location: [facility];  Service: Orthopedics;  Laterality: Left;   I & D EXTREMITY Left 05/03/2022    Procedure: DEBRIDEMENT OF LEFT ANKLE INFECTION;  Surgeon: Roby Lofts, MD;  Location: [facility];  Service: Orthopedics;  Laterality: Left;   IR FLUORO GUIDE CV LINE LEFT   12/27/2021   IR FLUORO GUIDE CV LINE RIGHT   10/12/2021   IR REMOVAL TUN CV CATH W/O FL   11/22/2021   IR REMOVAL TUN CV CATH W/O FL   10/10/2022   IR US GUIDE VASC ACCESS LEFT   12/27/2021   IR US GUIDE VASC ACCESS RIGHT   10/12/2021   KYPHOPLASTY N/A 02/03/2018    Procedure: [id number];  Surgeon: Kennedy Bucker,  MD;  Location: [facility];  Service: Orthopedics;  Laterality: N/A;   MASTECTOMY Right 1982   ORIF ANKLE FRACTURE Left 02/26/2021    Procedure: OPEN REDUCTION INTERNAL FIXATION (ORIF) ANKLE FRACTURE;  Surgeon: Roby Lofts, MD;  Location: [facility];  Service: Orthopedics;  Laterality: Left;   PACEMAKER INSERTION Left 07/03/2016    Procedure: INSERTION PACEMAKER;  Surgeon: Marcina Millard, MD;  Location: [facility];  Service: Cardiovascular;  Laterality: Left;   SKIN CANCER EXCISION       TONSILLECTOMY       TUBAL LIGATION       VISCERAL ANGIOGRAPHY N/A 11/03/2019    Procedure: VISCERAL ANGIOGRAPHY;  Surgeon: Renford Dills, MD;  Location: [facility];  Service: Cardiovascular;  Laterality: N/A;         Family History  Problem Relation Age of Onset   Prostate cancer Neg Hx     Kidney cancer Neg Hx     Breast cancer Neg Hx      Social History:  reports that she quit smoking about 68 years ago. Her smoking use included cigarettes. She started smoking about 69 years ago. She smoked an average of .5 packs per day. She has been exposed to tobacco smoke. She has never used smokeless tobacco. She reports current alcohol use. She reports that she does not currently use drugs. Allergies:       Allergies  Allergen Reactions   Baclofen Other (See Comments)      Hallucinations   Levofloxacin Other (See Comments)      Hallucinations           Medications Prior to Admission  Medication Sig Dispense Refill   acetaminophen (TYLENOL) 500 MG tablet Take 500 mg by mouth every 6 (six) hours as needed for moderate pain.       amitriptyline (ELAVIL) 10 MG tablet Take 10 mg by mouth at bedtime.       clopidogrel (PLAVIX) 75 MG tablet Take 75 mg by mouth daily.       desvenlafaxine (PRISTIQ) 50 MG 24 hr tablet Take 100 mg by mouth every evening.       ELIQUIS 5 MG TABS tablet Take 5 mg by mouth 2 (two) times daily.       hydrOXYzine (ATARAX) 10 MG tablet Take 1 tablet (10 mg total) by mouth 3  (three) times daily as needed. 30 tablet 0   insulin aspart (NOVOLOG FLEXPEN) 100 UNIT/ML FlexPen Hold until followup with outpatient provider. (Patient taking differently: Inject 4 Units into the skin daily.) 15 mL 11   insulin glargine (LANTUS SOLOSTAR) 100 UNIT/ML Solostar Pen Hold until followup with outpatient provider. (Patient taking differently: Inject 6 Units into the skin at bedtime.) 15 mL 11   levothyroxine (SYNTHROID) 125 MCG tablet Take 125 mcg by mouth daily before breakfast.       Melatonin 10 MG CAPS Take 10 mg by mouth at bedtime as needed (sleep).       metFORMIN (GLUCOPHAGE) 500 MG tablet Take 500 mg by mouth 2 (two) times daily with a meal.       metoprolol tartrate (LOPRESSOR) 25 MG tablet Take 1 tablet (25 mg total) by mouth 2 (two) times daily. 60 tablet 2   Multiple Vitamin (MULTIVITAMIN WITH MINERALS) TABS tablet Take 1 tablet by mouth daily. 30 tablet 0   nitroGLYCERIN (NITROSTAT) 0.4 MG SL tablet Place 1 tablet (0.4 mg total) under the tongue every 5 (five) minutes as needed for chest pain. 90 tablet 0   potassium chloride (KLOR-CON M) 10 MEQ tablet Take 10 mEq by mouth 2 (two) times daily.       rosuvastatin (CRESTOR) 10 MG tablet Take 10 mg by mouth at bedtime.       telmisartan (MICARDIS) 40 MG tablet Take 40 mg by mouth daily.       torsemide (DEMADEX) 20 MG tablet Take 20 mg by mouth daily.       traZODone (DESYREL) 50 MG tablet Take 50 mg by mouth at bedtime.       VITAMIN D PO Take 1 capsule by mouth daily.       linezolid (ZYVOX) 600 MG tablet Take 1 tablet (600 mg total) by mouth daily. (Patient not taking:  Reported on 12/30/2022) 30 tablet 5   [name] Tosylate 150 MG TABS Take 2 tablets (300 mg total) by mouth daily. (Patient not taking: Reported on 12/30/2022) 56 tablet 2      Home: Home Living Family/patient expects to be discharged to:: Private residence Living Arrangements: Spouse/significant other Available Help at Discharge: Family, Available 24  hours/day Type of Home: Apartment Home Access: Level entry Home Layout: One level Bathroom Shower/Tub: Engineer, manufacturing systems: Standard Bathroom Accessibility: Yes Home Equipment: Information systems manager, Wheelchair - manual, Rollator (4 wheels), Agricultural consultant (2 wheels), [employer] - single point, Grab bars - toilet, BSC/3in1, Grab bars - tub/shower Additional Comments: 4'11" requires youth walker  Functional History: Prior Function Prior Level of Function : Needs assist Physical Assist : Mobility (physical) Mobility (physical): Gait, Transfers Mobility Comments: recent PT to start walking on RW and uses wc for community ADLs Comments: Pt reporting he was performing BADLs. Functional Status:  Mobility: Bed Mobility Overal bed mobility: Needs Assistance Bed Mobility: Sit to Supine Rolling: Min guard Sidelying to sit: Min guard Sit to supine: Min guard Sit to sidelying: Min guard General bed mobility comments: Able to lift both LEs into bed without pysical assist Transfers Overall transfer level: Needs assistance Equipment used: Ambulation equipment used Transfers: Sit to/from Stand, Bed to chair/wheelchair/BSC Sit to Stand: Mod assist, +2 safety/equipment Bed to/from chair/wheelchair/BSC transfer type:: Via Lift equipment Stand pivot transfers: +2 safety/equipment, +2 physical assistance, Mod assist Transfer via Lift Equipment: Stedy General transfer comment: Stood to stedy with mod assist Ambulation/Gait General Gait Details: attempted but cannot yet   ADL: ADL Overall ADL's : Needs assistance/impaired Eating/Feeding: Set up, Sitting Grooming: Set up, Supervision/safety, Sitting Upper Body Bathing: Supervision/ safety, Set up, Sitting Lower Body Bathing: Moderate assistance, Sit to/from stand Upper Body Dressing : Supervision/safety, Set up, Sitting Lower Body Dressing: Maximal assistance, Sit to/from stand Toilet Transfer: Maximal assistance, Stand-pivot, +2 for  safety/equipment (simulated to recliner) Functional mobility during ADLs: Maximal assistance, +2 for safety/equipment (stand pivot)   Cognition: Cognition Overall Cognitive Status: Within Functional Limits for tasks assessed Orientation Level: Oriented X4 Cognition Arousal/Alertness: Awake/alert Behavior During Therapy: WFL for tasks assessed/performed Overall Cognitive Status: Within Functional Limits for tasks assessed General Comments: Oriented to self, place, date, and situation. Repeating certain information and requiring increased time for processing.   Blood pressure (!) 138/59, pulse 82, temperature (!) 97.5 F (36.4 C), temperature source Oral, resp. rate 17, height 4\' 11"  (1.499 m), weight 59 kg, SpO2 (!) 87 %. Physical Exam Constitutional:      General: She is not in acute distress. HENT:     Head: Normocephalic.     Right Ear: External ear normal.     Left Ear: External ear normal.     Nose: Nose normal.     Mouth/Throat:     Mouth: Mucous membranes are moist.  Eyes:     Extraocular Movements: Extraocular movements intact.     Pupils: Pupils are equal, round, and reactive to light.  Cardiovascular:     Rate and Rhythm: Normal rate.     Pulses: Normal pulses.  Pulmonary:     Effort: Pulmonary effort is normal.  Abdominal:     Palpations: Abdomen is soft.  Musculoskeletal:     Cervical back: Normal range of motion.  Skin:    Comments: Left BK in shrinker. Chronic changes RLE.   Neurological:     General: No focal deficit present.     Mental Status: She is alert  and oriented to person, place, and time.     Comments: Alert and oriented x 3. Normal insight and awareness. Intact Memory. Normal language and speech. Cranial nerve exam unremarkable. MMT: 4/5 UE. LLE limited by pain but 2-3/5. RLE 4/5 prox to distal. No focal sensory findings. .    Psychiatric:        Mood and Affect: Mood normal.        Behavior: Behavior normal.        Lab Results Last 24 Hours        Results for orders placed or performed during the hospital encounter of 01/03/23 (from the past 24 hour(s))  Glucose, capillary     Status: Abnormal    Collection Time: 01/05/23  4:32 PM  Result Value Ref Range    Glucose-Capillary 146 (H) 70 - 99 mg/dL  Glucose, capillary     Status: Abnormal    Collection Time: 01/05/23  9:42 PM  Result Value Ref Range    Glucose-Capillary 115 (H) 70 - 99 mg/dL  Glucose, capillary     Status: Abnormal    Collection Time: 01/06/23  7:35 AM  Result Value Ref Range    Glucose-Capillary 136 (H) 70 - 99 mg/dL  CBC     Status: Abnormal    Collection Time: 01/06/23  9:40 AM  Result Value Ref Range    WBC 13.6 (H) 4.0 - 10.5 K/uL    RBC 3.43 (L) 3.87 - 5.11 MIL/uL    Hemoglobin 9.7 (L) 12.0 - 15.0 g/dL    HCT 16.1 (L) 09.6 - 46.0 %    MCV 87.8 80.0 - 100.0 fL    MCH 28.3 26.0 - 34.0 pg    MCHC 32.2 30.0 - 36.0 g/dL    RDW 04.5 (H) 40.9 - 15.5 %    Platelets 312 150 - 400 K/uL    nRBC 0.0 0.0 - 0.2 %  Glucose, capillary     Status: Abnormal    Collection Time: 01/06/23 11:21 AM  Result Value Ref Range    Glucose-Capillary 144 (H) 70 - 99 mg/dL      Imaging Results (Last 48 hours)  No results found.     Assessment/Plan: Diagnosis: 89 yo female with non-healing wound LLE s/p left BKA   Does the need for close, 24 hr/day medical supervision in concert with the patient's rehab needs make it unreasonable for this patient to be served in a less intensive setting? Yes Co-Morbidities requiring supervision/potential complications:  -post-op pain, wound care -leukocytosis -post-op anemia -chronic kidney disease -pre-prosthetic education -dysphagia Due to bladder management, does the patient require 24 hr/day rehab nursing? Yes Does the patient require coordinated care of a physician, rehab nurse, therapy disciplines of PT, OT to address physical and functional deficits in the context of the above medical diagnosis(es)? Yes Addressing deficits in  the following areas: balance, endurance, locomotion, strength, transferring, bowel/bladder control, bathing, dressing, feeding, grooming, toileting, and psychosocial support Can the patient actively participate in an intensive therapy program of at least 3 hrs of therapy per day at least 5 days per week? Yes The potential for patient to make measurable gains while on inpatient rehab is excellent Anticipated functional outcomes upon discharge from inpatient rehab are supervision and min assist  with PT, supervision and min assist with OT, n/a with SLP. Estimated rehab length of stay to reach the above functional goals is: 10-13 days Anticipated discharge destination: Home Overall Rehab/Functional Prognosis: excellent   POST ACUTE RECOMMENDATIONS: This  patient's condition is appropriate for continued rehabilitative care in the following setting: CIR Patient has agreed to participate in recommended program. Yes Note that insurance prior authorization may be required for reimbursement for recommended care.   Comment:  Pt was independent with walker before this admission. Still dealing with a lot of pain but is motivated to return home with her husband who can rprovide her assistance.               I have personally performed a face to face diagnostic evaluation of this patient. Additionally, I have examined the patient's medical record including any pertinent labs and radiographic images. If the physician assistant has documented in this note, I have reviewed and edited or otherwise concur with the physician assistant's documentation.   Thanks,   Ranelle Oyster, MD 01/06/2023
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
## 2023-01-15 ENCOUNTER — Other Ambulatory Visit: Payer: Self-pay

## 2023-01-15 ENCOUNTER — Encounter (HOSPITAL_COMMUNITY): Payer: Self-pay | Admitting: Physical Medicine and Rehabilitation

## 2023-01-15 DIAGNOSIS — Z89512 Acquired absence of left leg below knee: Secondary | ICD-10-CM | POA: Diagnosis not present

## 2023-01-15 LAB — COMPREHENSIVE METABOLIC PANEL
ALT: 11 U/L (ref 0–44)
AST: 17 U/L (ref 15–41)
Albumin: 2.5 g/dL — ABNORMAL LOW (ref 3.5–5.0)
Alkaline Phosphatase: 81 U/L (ref 38–126)
Anion gap: 10 (ref 5–15)
BUN: 46 mg/dL — ABNORMAL HIGH (ref 8–23)
CO2: 24 mmol/L (ref 22–32)
Calcium: 9.2 mg/dL (ref 8.9–10.3)
Chloride: 100 mmol/L (ref 98–111)
Creatinine, Ser: 1.51 mg/dL — ABNORMAL HIGH (ref 0.44–1.00)
GFR, Estimated: 33 mL/min — ABNORMAL LOW (ref >60)
Glucose, Bld: 172 mg/dL — ABNORMAL HIGH (ref 70–99)
Potassium: 3.7 mmol/L (ref 3.5–5.1)
Sodium: 134 mmol/L — ABNORMAL LOW (ref 135–145)
Total Bilirubin: 0.8 mg/dL (ref 0.3–1.2)
Total Protein: 7.1 g/dL (ref 6.5–8.1)

## 2023-01-15 LAB — CBC WITH DIFFERENTIAL/PLATELET
Abs Immature Granulocytes: 0.28 10*3/uL — ABNORMAL HIGH (ref 0.00–0.07)
Basophils Absolute: 0.2 10*3/uL — ABNORMAL HIGH (ref 0.0–0.1)
Basophils Relative: 1 %
Eosinophils Absolute: 0.4 10*3/uL (ref 0.0–0.5)
Eosinophils Relative: 2 %
HCT: 28.9 % — ABNORMAL LOW (ref 36.0–46.0)
Hemoglobin: 9.2 g/dL — ABNORMAL LOW (ref 12.0–15.0)
Immature Granulocytes: 2 %
Lymphocytes Relative: 9 %
Lymphs Abs: 1.6 10*3/uL (ref 0.7–4.0)
MCH: 27.5 pg (ref 26.0–34.0)
MCHC: 31.8 g/dL (ref 30.0–36.0)
MCV: 86.5 fL (ref 80.0–100.0)
Monocytes Absolute: 1.4 10*3/uL — ABNORMAL HIGH (ref 0.1–1.0)
Monocytes Relative: 8 %
Neutro Abs: 13.8 10*3/uL — ABNORMAL HIGH (ref 1.7–7.7)
Neutrophils Relative %: 78 %
Platelets: 514 10*3/uL — ABNORMAL HIGH (ref 150–400)
RBC: 3.34 MIL/uL — ABNORMAL LOW (ref 3.87–5.11)
RDW: 16.2 % — ABNORMAL HIGH (ref 11.5–15.5)
WBC: 17.6 10*3/uL — ABNORMAL HIGH (ref 4.0–10.5)
nRBC: 0 % (ref 0.0–0.2)

## 2023-01-15 LAB — GLUCOSE, CAPILLARY
Glucose-Capillary: 96 mg/dL (ref 70–99)
Glucose-Capillary: 147 mg/dL — ABNORMAL HIGH (ref 70–99)
Glucose-Capillary: 126 mg/dL — ABNORMAL HIGH (ref 70–99)
Glucose-Capillary: 115 mg/dL — ABNORMAL HIGH (ref 70–99)

## 2023-01-15 LAB — HEMOGLOBIN A1C
Hgb A1c MFr Bld: 6.2 % — ABNORMAL HIGH (ref 4.8–5.6)
Mean Plasma Glucose: 131.24 mg/dL

## 2023-01-15 MED ORDER — POTASSIUM CHLORIDE 20 MEQ PO PACK
20.0000 meq | PACK | Freq: Once | ORAL | Status: AC
  Administered 2023-01-15: 20 meq via ORAL
  Filled 2023-01-15: qty 1

## 2023-01-15 MED ORDER — TRAZODONE HCL 50 MG PO TABS: 75.0000 mg | ORAL_TABLET | Freq: Every day | ORAL | Status: DC

## 2023-01-15 MED ORDER — TRAZODONE HCL 50 MG PO TABS
50.0000 mg | ORAL_TABLET | Freq: Every day | ORAL | Status: DC
  Administered 2023-01-15 – 2023-01-30 (×16): 50 mg via ORAL
  Filled 2023-01-15 (×16): qty 1

## 2023-01-15 MED ORDER — GABAPENTIN 100 MG PO CAPS
100.0000 mg | ORAL_CAPSULE | Freq: Every day | ORAL | Status: DC
  Administered 2023-01-15: 100 mg via ORAL
  Filled 2023-01-15: qty 1

## 2023-01-15 NOTE — Progress Notes (Signed)
Physical Therapy Session Note  Patient Details  Name: DORCIE LANSER MRN: [id number] Date of Birth: 06-Oct-1932  Today's Date: 01/15/2023 PT Missed Time: 30 Minutes Missed Time Reason: Patient fatigue  Short Term Goals: Week 1:  PT Short Term Goal 1 (Week 1): pt will transfer supine<>sitting EOB with CGA PT Short Term Goal 2 (Week 1): pt will transfer sit<>stand with LRAD and min A PT Short Term Goal 3 (Week 1): pt will transfer bed<>chair with LRAD and CGA  Skilled Therapeutic Interventions/Progress Updates:   Received pt semi-reclined in bed asleep. Upon wakening, pt initially jumpy and slightly disoriented but became more alert with gentle reorientation. Pt politely refused OOB mobility or bed level exercises due to fatigue, requesting to "rest" to prepare for tomorrow's therapies. 30 minutes missed of skilled physical therapy due to fatigue.   Therapy Documentation Precautions:  Precautions Precautions: Fall Precaution Comments: watch BP; drop in sitting EOB Restrictions Weight Bearing Restrictions: Yes LLE Weight Bearing: Non weight bearing  Therapy/Group: Individual Therapy [name] PT, DPT 01/15/2023, 7:25 AM
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
## 2023-01-15 NOTE — Progress Notes (Signed)
Inpatient Rehabilitation Center Individual Statement of Services  Patient Name:  Cassandra [name]  Date:  01/15/2023  Welcome to the Inpatient Rehabilitation Center.  Our goal is to provide you with an individualized program based on your diagnosis and situation, designed to meet your specific needs.  With this comprehensive rehabilitation program, you will be expected to participate in at least 3 hours of rehabilitation therapies Monday-Friday, with modified therapy programming on the weekends.  Your rehabilitation program will include the following services:  Physical Therapy (PT), Occupational Therapy (OT), Speech Therapy (ST), 24 hour per day rehabilitation nursing, Therapeutic Recreaction (TR), Neuropsychology, Care Coordinator, Rehabilitation Medicine, Nutrition Services, Pharmacy Services, and Other  Weekly team conferences will be held on Wednesdays to discuss your progress.  Your Inpatient Rehabilitation Care Coordinator will talk with you frequently to get your input and to update you on team discussions.  Team conferences with you and your family in attendance may also be held.  Expected length of stay: 10-13 Days  Overall anticipated outcome:  supervision to min assist   Depending on your progress and recovery, your program may change. Your Inpatient Rehabilitation Care Coordinator will coordinate services and will keep you informed of any changes. Your Inpatient Rehabilitation Care Coordinator's name and contact numbers are listed  below.  The following services may also be recommended but are not provided by the Inpatient Rehabilitation Center:   Home Health Rehabiltiation Services Outpatient Rehabilitation Services    Arrangements will be made to provide these services after discharge if needed.  Arrangements include referral to agencies that provide these services.  Your insurance has been verified to be:  [facility] Medicare Your primary doctor is:  Aram Beecham,  MD  Pertinent information will be shared with your doctor and your insurance company.  Inpatient Rehabilitation Care Coordinator:  Lavera Guise, [place] [phone] or [phone]  Information discussed with and copy given to patient by: Andria Rhein, 01/15/2023, 9:45 AM

## 2023-01-15 NOTE — Discharge Instructions (Addendum)
Inpatient Rehab Discharge Instructions  Cassandra [name] Discharge date and time: 01/31/2023   Activities/Precautions/ Functional Status: Activity: activity as tolerated and no lifting, driving, or strenuous exercise until cleared by MD Diet: diabetic diet Wound Care: Routine skin checks   Functional status:  ___ No restrictions     ___ Walk up steps independently _X__ 24/7 supervision/assistance   ___ Walk up steps with assistance ___ Intermittent supervision/assistance  ___ Bathe/dress independently ___ Walk with walker     _X__ Bathe/dress with assistance ___ Walk Independently    ___ Shower independently ___ Walk with assistance    ___ Shower with assistance _X__ No alcohol     ___ Return to work/school ________   Special Instructions:  No driving, alcohol consumption or tobacco use. 2. Use sliding board for transfers. Walking/hopping to be limited with therapy only for now.    COMMUNITY REFERRALS UPON DISCHARGE:    Home Health:   PT     OT     ST                    Agency: [facility] Phone: [phone]   Medical Equipment/Items Ordered: Drop Arm, Slideboard and Amputee pad                                                 Agency/Supplier: [employer] [phone]   My questions have been answered and I understand these instructions. I will adhere to these goals and the provided educational materials after my discharge from the hospital.  Patient/Caregiver Signature _______________________________ Date __________  Clinician Signature _______________________________________ Date __________  Please bring this form and your medication list with you to all your follow-up doctor's appointments.   Information on my medicine - ELIQUIS (apixaban)  This medication education was reviewed with me or my healthcare representative as part of my discharge preparation.  The pharmacist that spoke with me during my hospital stay was:    Why was Eliquis prescribed for you? Eliquis was  prescribed for you to reduce the risk of a blood clot forming that can cause a stroke if you have a medical condition called atrial fibrillation (a type of irregular heartbeat).  What do You need to know about Eliquis ? Take your Eliquis TWICE DAILY - one tablet in the morning and one tablet in the evening with or without food. If you have difficulty swallowing the tablet whole please discuss with your pharmacist how to take the medication safely.  Take Eliquis exactly as prescribed by your doctor and DO NOT stop taking Eliquis without talking to the doctor who prescribed the medication.  Stopping may increase your risk of developing a stroke.  Refill your prescription before you run out.  After discharge, you should have regular check-up appointments with your healthcare provider that is prescribing your Eliquis.  In the future your dose may need to be changed if your kidney function or weight changes by a significant amount or as you get older.  What do you do if you miss a dose? If you miss a dose, take it as soon as you remember on the same day and resume taking twice daily.  Do not take more than one dose of ELIQUIS at the same time to make up a missed dose.  Important Safety Information A possible side effect of Eliquis is bleeding. You should call  your healthcare provider right away if you experience any of the following: Bleeding from an injury or your nose that does not stop. Unusual colored urine (red or dark brown) or unusual colored stools (red or black). Unusual bruising for unknown reasons. A serious fall or if you hit your head (even if there is no bleeding).  Some medicines may interact with Eliquis and might increase your risk of bleeding or clotting while on Eliquis. To help avoid this, consult your healthcare provider or pharmacist prior to using any new prescription or non-prescription medications, including herbals, vitamins, non-steroidal anti-inflammatory drugs  (NSAIDs) and supplements.  This website has more information on Eliquis (apixaban): http://www.eliquis.com/eliquis/home

## 2023-01-15 NOTE — Plan of Care (Signed)
  Problem: RH Balance Goal: LTG: Patient will maintain dynamic sitting balance (OT) Description: LTG:  Patient will maintain dynamic sitting balance with assistance during activities of daily living (OT) Flowsheets (Taken 01/15/2023 1218) LTG: Pt will maintain dynamic sitting balance during ADLs with: Independent Goal: LTG Patient will maintain dynamic standing with ADLs (OT) Description: LTG:  Patient will maintain dynamic standing balance with assist during activities of daily living (OT)  Flowsheets (Taken 01/15/2023 1218) LTG: Pt will maintain dynamic standing balance during ADLs with: Contact Guard/Touching assist   Problem: Sit to Stand Goal: LTG:  Patient will perform sit to stand in prep for activites of daily living with assistance level (OT) Description: LTG:  Patient will perform sit to stand in prep for activites of daily living with assistance level (OT) Flowsheets (Taken 01/15/2023 1218) LTG: PT will perform sit to stand in prep for activites of daily living with assistance level: Supervision/Verbal cueing   Problem: RH Bathing Goal: LTG Patient will bathe all body parts with assist levels (OT) Description: LTG: Patient will bathe all body parts with assist levels (OT) Flowsheets (Taken 01/15/2023 1218) LTG: Pt will perform bathing with assistance level/cueing: Supervision/Verbal cueing   Problem: RH Dressing Goal: LTG Patient will perform upper body dressing (OT) Description: LTG Patient will perform upper body dressing with assist, with/without cues (OT). Flowsheets (Taken 01/15/2023 1218) LTG: Pt will perform upper body dressing with assistance level of: Set up assist Goal: LTG Patient will perform lower body dressing w/assist (OT) Description: LTG: Patient will perform lower body dressing with assist, with/without cues in positioning using equipment (OT) Flowsheets (Taken 01/15/2023 1218) LTG: Pt will perform lower body dressing with assistance level of: Supervision/Verbal  cueing   Problem: RH Toileting Goal: LTG Patient will perform toileting task (3/3 steps) with assistance level (OT) Description: LTG: Patient will perform toileting task (3/3 steps) with assistance level (OT)  Flowsheets (Taken 01/15/2023 1218) LTG: Pt will perform toileting task (3/3 steps) with assistance level: Supervision/Verbal cueing   Problem: RH Toilet Transfers Goal: LTG Patient will perform toilet transfers w/assist (OT) Description: LTG: Patient will perform toilet transfers with assist, with/without cues using equipment (OT) Flowsheets (Taken 01/15/2023 1218) LTG: Pt will perform toilet transfers with assistance level of: Supervision/Verbal cueing   Problem: RH Tub/Shower Transfers Goal: LTG Patient will perform tub/shower transfers w/assist (OT) Description: LTG: Patient will perform tub/shower transfers with assist, with/without cues using equipment (OT) Flowsheets (Taken 01/15/2023 1218) LTG: Pt will perform tub/shower stall transfers with assistance level of: Contact Guard/Touching assist   Problem: RH Memory Goal: LTG Patient will demonstrate ability for day to day recall/carry over during activities of daily living with assistance level (OT) Description: LTG:  Patient will demonstrate ability for day to day recall/carry over during activities of daily living with assistance level (OT). Flowsheets (Taken 01/15/2023 1218) LTG:  Patient will demonstrate ability for day to day recall/carry over during activities of daily living with assistance level (OT): Supervision   Problem: RH Awareness Goal: LTG: Patient will demonstrate awareness during functional activites type of (OT) Description: LTG: Patient will demonstrate awareness during functional activites type of (OT) Flowsheets (Taken 01/15/2023 1218) Patient will demonstrate awareness during functional activites type of: Anticipatory LTG: Patient will demonstrate awareness during functional activites type of (OT):  Supervision

## 2023-01-15 NOTE — Plan of Care (Signed)
  Problem: Consults Goal: RH LIMB LOSS PATIENT EDUCATION Description: Description: See Patient Education module for eduction specifics. Outcome: Progressing   Problem: RH BOWEL ELIMINATION Goal: RH STG MANAGE BOWEL WITH ASSISTANCE Description: STG Manage Bowel with mod I Assistance. Outcome: Progressing Goal: RH STG MANAGE BOWEL W/MEDICATION W/ASSISTANCE Description: STG Manage Bowel with Medication with mod I Assistance. Outcome: Progressing   Problem: RH SKIN INTEGRITY Goal: RH STG SKIN FREE OF INFECTION/BREAKDOWN Description: With min assist Outcome: Progressing Goal: RH STG ABLE TO PERFORM INCISION/WOUND CARE W/ASSISTANCE Description: STG Able To Perform Incision/Wound Care With min Assistance. Outcome: Progressing   Problem: RH SAFETY Goal: RH STG ADHERE TO SAFETY PRECAUTIONS W/ASSISTANCE/DEVICE Description: STG Adhere to Safety Precautions With cues Assistance/Device. Outcome: Progressing   Problem: RH PAIN MANAGEMENT Goal: RH STG PAIN MANAGED AT OR BELOW PT'S PAIN GOAL Description: < 4 with prns Outcome: Progressing   Problem: RH KNOWLEDGE DEFICIT LIMB LOSS Goal: RH STG INCREASE KNOWLEDGE OF SELF CARE AFTER LIMB LOSS Description: Manage care using educational resources independently Outcome: Progressing

## 2023-01-15 NOTE — Progress Notes (Signed)
Patient ID: Cassandra [name], female   DOB: [birth year], 89 y.o.   MRN: [id number]  Team Conference Report to Patient/Family  Team Conference discussion was reviewed with the patient and caregiver, including goals, any changes in plan of care and target discharge date.  Patient and caregiver express understanding and are in agreement.  The patient has a target discharge date of 01/29/23.  SW met with patient and spouse in room, introduced self, explained role and addressed questions and concerns. Pt anticipates discharging home with spouse 24/7, 1 level home and level entry.  Pt currently on 3L of 02 and sutures to be removed 5/17. Pt and spouse aware of current d/c date. Sw contact information provided to spouse, [name]. SW ill follow up with pt and spouse with recommendations closer to d/c. No additional questions or concerns.   Andria Rhein 01/15/2023, 2:38 PM
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
## 2023-01-15 NOTE — Patient Care Conference (Signed)
Inpatient RehabilitationTeam Conference and Plan of Care Update Date: 01/15/2023   Time: 11:45 AM    Patient Name: Cassandra [name]      Medical Record Number: [id number]  Date of Birth: 24-Feb-1933 Sex: Female         Room/Bed: 4W05C/4W05C-01 Payor Info: Payor: Advertising copywriter MEDICARE / Plan: [facility] MEDICARE / Product Type: *No Product type* /    Admit Date/Time:  01/14/2023  4:06 PM  Primary Diagnosis:  Left below-knee amputee [facility])  Hospital Problems: Principal Problem:   Left below-knee amputee [facility])    Expected Discharge Date: Expected Discharge Date: 01/29/23  Team Members Present: Physician leading conference: Dr. Sula Soda Social Worker Present: Lavera Guise, BSW Nurse Present: Chana Bode, RN PT Present: Raechel Chute, PT OT Present: Candee Furbish, OT PPS Coordinator present : Fae Pippin, SLP     Current Status/Progress Goal Weekly Team Focus  Bowel/Bladder   Continent/incontinent of b/b.   Decrease incontinent episodes and regain full continence   Assist with toileting as needed    Swallow/Nutrition/ Hydration               ADL's   Min A UB, Max A LB, Max A toileting. Limited by dizziness and lethargy on eval   Supervision   ADL retraining, endurance, sit > stands, transfers, balance, family ed    Mobility   rolling supervision, supine<>sit from flat bed mod A, squat<>pivot mod A   supervision  functional mobility/transfers, generalized strengthening and endurance, dynamic standing balanace/coordination, BP management, pain management, limb loss education.    Communication                Safety/Cognition/ Behavioral Observations               Pain   No c/o pain at this time   Pain <3/10   Assess Qshift and prn    Skin   Lt BKA, ecchymosis on arms and leg, remainder of skin intact   BKA to heal without complications and maintain remainder of skin integrity  Assess Qshift and prn      Discharge Planning:  1 level 1  ste home with spouse     Team Discussion: Patient is slow moving with dizziness and lethargy post left BKA.  Limited by phantom pain and fatigue with extensive medical history.  Patient on target to meet rehab goals: yes, currently needs mod assist for squat pivot transfers.  *See Care Plan and progress notes for long and short-term goals.   Revisions to Treatment Plan:  Suture removal 01/17/23   Teaching Needs: Safety, skin care, medications, dietary modifications, transfers, toileting, etc.  Current Barriers to Discharge: Decreased caregiver support and Home enviroment access/layout  Possible Resolutions to Barriers: Family education HH follow up services     Medical Summary Current Status: phatnom limb pain, lethargic, insomnia, suboptimal potassium, hyperglycemia, leukocytosis, hyponatremia, CKD, hypoalbuminemia, thrombocytosis  Barriers to Discharge: Medical stability  Barriers to Discharge Comments: phatnom limb pain, lethargic, insomnia, suboptimal potassium, hyperglycemia, leukocytosis, hyponatremia, CKD, hypoalbuminemia, thrombocytosis Possible Resolutions to [employer]: continue amitirptyline, add gabapentin 100mg  HS, continue melatonin, supplement potssium, d/c ISS, monitor Na, Creatinin, albumin, platelet count   Continued Need for Acute Rehabilitation Level of Care: The patient requires daily medical management by a physician with specialized training in physical medicine and rehabilitation for the following reasons: Direction of a multidisciplinary physical rehabilitation program to maximize functional independence : Yes Medical management of patient stability for increased activity during participation in an intensive  rehabilitation regime.: Yes Analysis of laboratory values and/or radiology reports with any subsequent need for medication adjustment and/or medical intervention. : Yes   I attest that I was present, lead the team conference, and concur with  the assessment and plan of the team.   Chana Bode B 01/15/2023, 4:00 PM

## 2023-01-16 DIAGNOSIS — Z89512 Acquired absence of left leg below knee: Secondary | ICD-10-CM | POA: Diagnosis not present

## 2023-01-16 LAB — GLUCOSE, CAPILLARY
Glucose-Capillary: 113 mg/dL — ABNORMAL HIGH (ref 70–99)
Glucose-Capillary: 229 mg/dL — ABNORMAL HIGH (ref 70–99)
Glucose-Capillary: 134 mg/dL — ABNORMAL HIGH (ref 70–99)
Glucose-Capillary: 92 mg/dL (ref 70–99)

## 2023-01-16 MED ORDER — VITAMIN D (ERGOCALCIFEROL) 1.25 MG (50000 UNIT) PO CAPS
50000.0000 [IU] | ORAL_CAPSULE | ORAL | Status: DC
  Administered 2023-01-16 – 2023-01-30 (×3): 50000 [IU] via ORAL
  Filled 2023-01-16 (×3): qty 1

## 2023-01-16 MED ORDER — GABAPENTIN 100 MG PO CAPS
100.0000 mg | ORAL_CAPSULE | Freq: Two times a day (BID) | ORAL | Status: DC
  Administered 2023-01-16 – 2023-01-17 (×3): 100 mg via ORAL
  Filled 2023-01-16 (×3): qty 1

## 2023-01-16 NOTE — Progress Notes (Signed)
Physical Therapy Session Note  Patient Details  Name: Cassandra [name] MRN: [id number] Date of Birth: [birth year]  Today's Date: 01/16/2023 PT Individual Time: 1048-1130 PT Individual Time Calculation (min): 42 min   Short Term Goals: Week 1:  PT Short Term Goal 1 (Week 1): pt will transfer supine<>sitting EOB with CGA PT Short Term Goal 2 (Week 1): pt will transfer sit<>stand with LRAD and min A PT Short Term Goal 3 (Week 1): pt will transfer bed<>chair with LRAD and CGA  Skilled Therapeutic Interventions/Progress Updates:    Pt presents in room in [facility], reporting increased pain in L phantom limb requesting pain medication. RN medicates during session. Pt agreeable to PT. Pt confused throughout session, poor sequencing and problem solving, and reporting having some hallucinations including thinking a dog crawled into bed with her last night, RN notified. Session focused on transfer training, education on new limb loss and phantom pain, and monitoring vitals with mobility.  Pt requesting to use bathroom, therapist transports pt to bathroom dependently for pt urgency and energy conservation. Therapist attempt to use slideboard for transfer WC>platform BSC over toilet however pt requesting to not use slideboard. Pt completes squat pivot mod assist with max verbal cues for pt hand placement (placing RUE on opposite BSC armrest for transfer to R). Pt with max verbal cues for completing mini stand for therapist to assist with doffing pants max assist. Pt continent of urine on BSC, charted. Pt does complain of burning during voiding, RN notified. Pt completes hygiene in sitting with verbal cues from therapist for technique, increased success noted with wiping posteriorly while leaning forward. Pt completes mini stand (BUEs on BSC armrests) with therapist assist for donning pants max assist, max verbal cues to complete due to confusion and decreased sustained attention on task. Pt completes squat pivot mod  assist max verbal cues for LUE hand placement for transfer to L side.   Pt transported dependently to sink to complete hand hygiene with mod to max verbal cues for sequencing and redirection, pt highly distractible difficulty with sustained attention discussing her appearance and then tangential speech.  Pt reporting SOB during toileting activity and requesting to don supplemental O2, placed at 1L. Pt demo SpO2 100% with 1L donned, pt denies SOB and requests to doff supplemental O2. Pt BP 103/40 mmHg, HR 64 bpm in sitting, pt denies dizziness but agreeable to return to bed.  Pt attempts to complete transfer WC to bed to L without verbal cueing for hand placement, unable to complete. With verbal cues pt completes squat pivot with mod assist. Pt then lays posteriorly on bed horizontally, therapist providing max verbal cues for laying down with head on pillow and min assist for positioning.  Pt educated on maintaining terminal knee extension on L residual limb as well as education on phantom limb pain however pt will benefit from reinforcement due to confusion. Pt incision inspected, noted darkened areas around incision. RN notified. Therapist dons shrinker with total assist to LLE. Pt remains supine with all needs within reach, call light in place, and bed alarm activated at end of session.  Therapy Documentation Precautions:  Precautions Precautions: Fall Precaution Comments: monitor BP and O2 Restrictions Weight Bearing Restrictions: Yes LLE Weight Bearing: Non weight bearing   Therapy/Group: Individual Therapy  Edwin Cap PT, DPT 01/16/2023, 12:06 PM

## 2023-01-16 NOTE — Plan of Care (Signed)
  Problem: RH SKIN INTEGRITY Goal: RH STG SKIN FREE OF INFECTION/BREAKDOWN Description: With min assist Outcome: Progressing Goal: RH STG ABLE TO PERFORM INCISION/WOUND CARE W/ASSISTANCE Description: STG Able To Perform Incision/Wound Care With min Assistance. Outcome: Progressing
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
## 2023-01-16 NOTE — Progress Notes (Signed)
Physical Therapy Session Note  Patient Details  Name: Cassandra [name] MRN: [id number] Date of Birth: 09-14-1932  Today's Date: 01/16/2023 PT Individual Time: 8119-1478 PT Individual Time Calculation (min): 23 min   And Today's Date: 01/16/2023 PT Missed Time: 22 Minutes Missed Time Reason: Patient fatigue;Pain  Short Term Goals: Week 1:  PT Short Term Goal 1 (Week 1): pt will transfer supine<>sitting EOB with CGA PT Short Term Goal 2 (Week 1): pt will transfer sit<>stand with LRAD and min A PT Short Term Goal 3 (Week 1): pt will transfer bed<>chair with LRAD and CGA  Skilled Therapeutic Interventions/Progress Updates:    Pt received supine in bed with her husband, Cassandra [name], present and pt reports she is extremely fatigued after performing toileting with nursing staff.  Pt declines OOB mobility, but is eager to participate in amputee education. Pt's limb guard was delivered to her room. Therapist educated on how to properly don/doff limb guard and purpose to protect her limb during mobility tasks as well as help with L knee positioning into extension to decrease risk of flexion contracture. Pt appears moderately confused throughout with some hard of hearing making education challenging; however, her spouse is very engaged and appears to have improved recall of the education to help reinforce it with the patient. Limb guard appears to have a good fit. Pt reports she was having pain in her hips from sitting up in the wheelchair for prolonged amount of time earlier - made primary PT aware. Pt also reports having a lot of phantom limb pain - educated on option to try mirror therapy, especially on days where pain is limiting her participation in other mobility tasks. Pt had 2 extra shrinkers delivered to her room - reeducated on purpose of this for limb shaping. Therapist also retrieved a youth height RW for pt to use in future therapy sessions based on request by primary PT. Pt continues to decline  participation in OOB mobility at this time; therefore, pt left supine in bed with needs in reach, bed alarm on, and her spouse present. Missed 22 minutes of skilled physical therapy.  Therapy Documentation Precautions:  Precautions Precautions: Fall Precaution Comments: monitor BP and O2 Restrictions Weight Bearing Restrictions: Yes LLE Weight Bearing: Non weight bearing   Pain:  Reports phantom pain in L LE, specifically in her great toe, premedicated.   Therapy/Group: Individual Therapy  Ginny Forth , PT, DPT, NCS, CSRS 01/16/2023, 1:15 PM

## 2023-01-16 NOTE — Progress Notes (Signed)
Inpatient Rehabilitation Care Coordinator Assessment and Plan Patient Details  Name: Cassandra [name] MRN: [id number] Date of Birth: 1932-09-10  Today's Date: 01/16/2023  Hospital Problems: Principal Problem:   Left below-knee amputee [facility])  Past Medical History:  Past Medical History:  Diagnosis Date   Acquired hypothyroidism 01/06/2014   Anxiety    Arthritis    B12 deficiency 02/15/2014   Benign essential hypertension 01/06/2014   Breast cancer (HCC) 1982   Right breast cancer - chemotherapy   CAD (coronary artery disease), native coronary artery 01/06/2014   Carotid artery calcification    Chronic airway obstruction, not elsewhere classified 01/06/2014   Chronic mesenteric ischemia (HCC)    s/p SMA stent 11/03/19   Depression    Diabetes mellitus without complication (HCC)    Dysrhythmia    aflutter with RVR 06/2016   History of kidney stones 2019   left ureteral stone   Incontinence of urine    Myocardial infarction [facility]) 1982   small infarct   Neuromuscular disorder (HCC)    restless legs   Pacemaker    Presence of permanent cardiac pacemaker 07/2016   Pure hypercholesterolemia 01/06/2014   Renal insufficiency    Skin cancer    Sleep apnea    no longer uses a cpap. Uses oxygen as needed   Past Surgical History:  Past Surgical History:  Procedure Laterality Date   AMPUTATION Left 01/03/2023   Procedure: AMPUTATION BELOW KNEE;  Surgeon: Roby Lofts, MD;  Location: [facility] OR;  Service: Orthopedics;  Laterality: Left;   ANKLE FUSION Left 10/10/2021   Procedure: ANKLE FUSION;  Surgeon: Roby Lofts, MD;  Location: [facility] OR;  Service: Orthopedics;  Laterality: Left;   APPENDECTOMY     APPLICATION OF WOUND VAC Left 02/24/2021   Procedure: APPLICATION OF WOUND VAC;  Surgeon: Samson Frederic, MD;  Location: [facility] OR;  Service: Orthopedics;  Laterality: Left;   AUGMENTATION MAMMAPLASTY Bilateral 1982/redo in 2014   prior mastectomy   CARDIAC CATHETERIZATION N/A 04/23/2016    Procedure: Left Heart Cath and Coronary Angiography;  Surgeon: Lamar Blinks, MD;  Location: [facility] INVASIVE CV LAB;  Service: Cardiovascular;  Laterality: N/A;   CARDIAC CATHETERIZATION Left 04/23/2016   Procedure: Coronary Stent Intervention;  Surgeon: Alwyn Pea, MD;  Location: [facility] INVASIVE CV LAB;  Service: Cardiovascular;  Laterality: Left;   CAROTID ARTERY ANGIOPLASTY Left 2010   had stent inserted and removed d/t infection. artery from leg inserted in left carotid   CAROTID ENDARTERECTOMY Left 2010   left CEA ~ 2010, s/p excision infected left carotid patch & pseudoaneurysm with left CCA-ICA bypass using SVG 05/20/12   CHOLECYSTECTOMY     CORONARY ANGIOPLASTY WITH STENT PLACEMENT  september 9th 2017   CYSTOSCOPY W/ URETERAL STENT PLACEMENT Left 08/17/2017   Procedure: CYSTOSCOPY WITH RETROGRADE PYELOGRAM/URETERAL STENT PLACEMENT;  Surgeon: Jerilee Field, MD;  Location: [facility] ORS;  Service: Urology;  Laterality: Left;   CYSTOSCOPY W/ URETERAL STENT PLACEMENT Left 09/16/2017   Procedure: CYSTOSCOPY WITH STENT REPLACEMENT;  Surgeon: [name], MD;  Location: [facility] ORS;  Service: Urology;  Laterality: Left;   CYSTOSCOPY/RETROGRADE/URETEROSCOPY/STONE EXTRACTION WITH BASKET Left 09/16/2017   Procedure: CYSTOSCOPY/RETROGRADE/URETEROSCOPY/STONE EXTRACTION WITH BASKET;  Surgeon: [name], MD;  Location: [facility] ORS;  Service: Urology;  Laterality: Left;   EXTERNAL FIXATION LEG Left 02/24/2021   Procedure: EXTERNAL FIXATION  ANKLE;  Surgeon: Samson Frederic, MD;  [facility]: [facility];  Service: Orthopedics;  Laterality: Left;   EXTERNAL FIXATION REMOVAL Left 02/26/2021  Procedure: REMOVAL EXTERNAL FIXATION LEG;  Surgeon: Roby Lofts, MD;  Location: [facility] OR;  Service: Orthopedics;  Laterality: Left;   EYE SURGERY Bilateral    cataract extraction   I & D EXTREMITY Left 02/24/2021   Procedure: IRRIGATION AND DEBRIDEMENT EXTREMITY;  Surgeon: Samson Frederic, MD;  Location: [facility]  OR;  Service: Orthopedics;  Laterality: Left;   I & D EXTREMITY Left 12/21/2021   Procedure: IRRIGATION AND DEBRIDEMENT LEFT ANKLE;  Surgeon: Roby Lofts, MD;  Location: [facility] OR;  Service: Orthopedics;  Laterality: Left;   I & D EXTREMITY Left 05/03/2022   Procedure: DEBRIDEMENT OF LEFT ANKLE INFECTION;  Surgeon: Roby Lofts, MD;  [facility]: [facility];  Service: Orthopedics;  Laterality: Left;   IR FLUORO GUIDE CV LINE LEFT  12/27/2021   IR FLUORO GUIDE CV LINE RIGHT  10/12/2021   IR REMOVAL TUN CV CATH W/O FL  11/22/2021   IR REMOVAL TUN CV CATH W/O FL  10/10/2022   IR US GUIDE VASC ACCESS LEFT  12/27/2021   IR US GUIDE VASC ACCESS RIGHT  10/12/2021   KYPHOPLASTY N/A 02/03/2018   Procedure: [id number];  Surgeon: Kennedy Bucker, MD;  Location: [facility] ORS;  Service: Orthopedics;  Laterality: N/A;   MASTECTOMY Right 1982   ORIF ANKLE FRACTURE Left 02/26/2021   Procedure: OPEN REDUCTION INTERNAL FIXATION (ORIF) ANKLE FRACTURE;  Surgeon: Roby Lofts, MD;  Location: [facility] OR;  Service: Orthopedics;  Laterality: Left;   PACEMAKER INSERTION Left 07/03/2016   Procedure: INSERTION PACEMAKER;  Surgeon: Marcina Millard, MD;  Location: [facility] ORS;  Service: Cardiovascular;  Laterality: Left;   SKIN CANCER EXCISION     TONSILLECTOMY     TUBAL LIGATION     VISCERAL ANGIOGRAPHY N/A 11/03/2019   Procedure: VISCERAL ANGIOGRAPHY;  Surgeon: Renford Dills, MD;  Location: [facility] INVASIVE CV LAB;  Service: Cardiovascular;  Laterality: N/A;   Social History:  reports that she quit smoking about 68 years ago. Her smoking use included cigarettes. She started smoking about 69 years ago. She smoked an average of .5 packs per day. She has been exposed to tobacco smoke. She has never used smokeless tobacco. She reports current alcohol use. She reports that she does not currently use drugs.  Family / Support Systems Marital Status: Married Patient Roles: Spouse Spouse/Significant Other: Warehouse manager Children: son  & daughter Other Supports: N/A Anticipated Caregiver: [name], spouse and daughter, Idalia Needle Ability/Limitations of Caregiver: Min A with ADLS Caregiver Availability: 24/7 Family Dynamics: Support from family. Spouse 24/7  Social History Preferred language: English Religion: Baptist Cultural Background: Pt receiving assist from spouse. Pt prefers things organized (spouse ofter avaliable to assist) Education: HS Health Literacy - How often do you need to have someone help you when you read instructions, pamphlets, or other written material from your doctor or pharmacy?: Often Writes: Yes Employment Status: Unemployed Marine scientist Issues: N/a Guardian/Conservator: N/a   Abuse/Neglect Abuse/Neglect Assessment Can Be Completed: Yes Physical Abuse: Denies Verbal Abuse: Denies Sexual Abuse: Denies Exploitation of patient/patient's resources: Denies Self-Neglect: Denies  Patient response to: Social Isolation - How often do you feel lonely or isolated from those around you?: Never  Emotional Status Pt's affect, behavior and adjustment status: Pt pleasant and easily distractable Recent Psychosocial Issues: coping Psychiatric History: hx of anxiety and depression Substance Abuse History: N/A  Patient / Family Perceptions, Expectations & Goals Pt/Family understanding of illness & functional limitations: yes, spouse at bedside Premorbid pt/family roles/activities: Patient requiring some assistance since past  [facility] admission. Required assist with self care, mobility, and cogntive tasks. Using RW Anticipated changes in roles/activities/participation: Discharging home with assistance from spouse 24/7 and PRN assistance from daughter Pt/family expectations/goals: Supervision-Min A  [employer]: Other (Comment) ([facility]) Premorbid Home Care/DME Agencies: Other (Comment) (Shower seat, WC Rollator, RW, SPC, BSC, & grab bars) Transportation available at  discharge: Spouse or daughter able to transport Is the patient able to respond to transportation needs?: Yes In the past 12 months, has lack of transportation kept you from medical appointments or from getting medications?: No In the past 12 months, has lack of transportation kept you from meetings, work, or from getting things needed for daily living?: No Resource referrals recommended: Neuropsychology  Discharge Planning Living Arrangements: Spouse/significant other Support Systems: Spouse/significant other, Children Type of Residence: Private residence (1 level, level entry) Civil engineer, contracting: Media planner (specify) Education officer, museum MEDICARE) Surveyor, quantity Resources: Restaurant manager, fast food Screen Referred: No Living Expenses: Lives with family Money Management: Spouse Does the patient have any problems obtaining your medications?: No Home Management: Spouse assisted at baseline Patient/Family Preliminary Plans: Spouse plans to continue. Daughter able to assist if needed Care Coordinator Barriers to Discharge: Insurance for SNF coverage Care Coordinator Anticipated Follow Up Needs: HH/OP DC Planning Additional Notes/Comments: Patient established with [facility],3L 02 Expected length of stay: 10-13 Days  Clinical Impression SW met with patient and spouse in room on 01/15/2023, introduced self, explained role and addressed questions and concerns. Pt anticipates discharging home with spouse 24/7, 1 level home and level entry. Pt currently on 3L of 02 and sutures to be removed 5/17. Pt and spouse aware of current d/c date. Sw contact information provided to spouse, [name]. SW ill follow up with pt and spouse with recommendations closer to d/c. No additional questions or concerns   Andria Rhein 01/16/2023, 9:27 AM

## 2023-01-16 NOTE — Progress Notes (Signed)
Physical Therapy Session Note  Patient Details  Name: Cassandra [name] MRN: [id number] Date of Birth: 16-Jan-1933  Today's Date: 01/16/2023 PT Individual Time: [id number] and [id number] PT Individual Time Calculation (min): 57 min and 44 min  Short Term Goals: Week 1:  PT Short Term Goal 1 (Week 1): pt will transfer supine<>sitting EOB with CGA PT Short Term Goal 2 (Week 1): pt will transfer sit<>stand with LRAD and min A PT Short Term Goal 3 (Week 1): pt will transfer bed<>chair with LRAD and CGA  Skilled Therapeutic Interventions/Progress Updates:   Treatment Session 1 Received pt semi-reclined in bed eating breakfast. While pt finished eating, located 16x16 Roho cushion for pressure relief on buttocks (pt reported discomfort yesterday) and reached out to [employer] for limb guard and additional shrinkers. Pt agreeable to PT treatment and reported intermittent phantom pain spasms throughout session (unrated). Session with emphasis on dressing, functional mobility/transfers, generalized strengthening and endurance, and dynamic standing balance/coordination. Pt on RA with SPO2 99% at rest. Donned L shrinker with supervision (cues for which side to put against incision). Pt bridged to remove dirty brief and donned clean one with mod A via rolling (supervision using bedrails) and pants in supine with supervision and increased time.   Pt transferred supine<>sitting EOB from flat bed using bedrails and CGA and increased time with cues for rolling technique, hooking R ankle on bed for stability, and pushing up with L elbow. Doffed dirty shirt and donned clean one with set up assist and scooted to EOB with supervision and increased time to get R foot on floor. Pt performed x 2 sit<>stands from EOB with RW and mod fading to heavy min A. Attempted stand<>pivot to [facility], however pt unable to get enough leverage on RW (needs youth RW) to pivot R foot and limited by fatigue; therefore returned to sitting EOB and  transferred bed<>WC squat<>pivot to R with max A due to fatigue - cues to scoot to EOB, for hand placement, and anterior weight shifting. Dynomat appeared inaccurate, reading SPO2 85% but pt denied any SOB, therefore warmed up hands with heat pack and new reading showed 96%. Concluded session with pt sitting in WC, needs within reach, and chair pad alarm on. RN present at bedside and updated on O2 readings.    Vitals: BP sitting EOB: 144/61 - pt initially symptomatic but improved with increased time  Treatment Session 2 Received pt semi-reclined in bed with husband present at bedside. NT arrived to check O2 but unable to get accurate reading despite therapist warming hands with heat pack; therefore went based off pt's symptoms and pt denied any SOB. Pt agreeable to PT treatment and reported pain in L residual limb (premedicated) - encouraged densitization for pain relief. Pt transferred semi-reclined<>sitting EOB with HOB elevated and use of bedrails with supervision and increased time. Pt scooted to EOB with supervision and transferred bed<>WC via slideboard to R with min A, increased time, and cues for scooting technique. [employer] delivered limb guard, then pt reported urge to toilet and transferred to/from bedside commode with toilet over top via stand<>pivot using grab bar and min/mod A. Pt required total A for clothing management and continent of bowel and bladder - able to perform pericare without assist. Educated pt/husband on contracture prevention and importance of keeping L knee extended for future prosthetic fitting. Concluded session with pt sitting in WC, needs within reach, and chair pad alarm on awaiting upcoming OT session.   Therapy Documentation Precautions:  Precautions Precautions: Fall Precaution  Comments: monitor BP and O2 Restrictions Weight Bearing Restrictions: Yes LLE Weight Bearing: Non weight bearing  Therapy/Group: Individual Therapy [name] PT,  DPT 01/16/2023, 7:01 AM

## 2023-01-17 DIAGNOSIS — Z89512 Acquired absence of left leg below knee: Secondary | ICD-10-CM | POA: Diagnosis not present

## 2023-01-17 LAB — GLUCOSE, CAPILLARY
Glucose-Capillary: 131 mg/dL — ABNORMAL HIGH (ref 70–99)
Glucose-Capillary: 138 mg/dL — ABNORMAL HIGH (ref 70–99)
Glucose-Capillary: 100 mg/dL — ABNORMAL HIGH (ref 70–99)
Glucose-Capillary: 127 mg/dL — ABNORMAL HIGH (ref 70–99)

## 2023-01-17 MED ORDER — GABAPENTIN 100 MG PO CAPS
100.0000 mg | ORAL_CAPSULE | Freq: Every day | ORAL | Status: DC
  Administered 2023-01-18 – 2023-01-23 (×6): 100 mg via ORAL
  Filled 2023-01-17 (×6): qty 1

## 2023-01-17 NOTE — Progress Notes (Signed)
Sutures removed per MD orders.    Tilden Dome, LPN

## 2023-01-17 NOTE — Progress Notes (Signed)
Physical Therapy Session Note  Patient Details  Name: Cassandra [name] MRN: [id number] Date of Birth: [birth year]  Today's Date: 01/17/2023 PT Individual Time: 1017-1044 PT Individual Time Calculation (min): 27 min   Short Term Goals: Week 1:  PT Short Term Goal 1 (Week 1): pt will transfer supine<>sitting EOB with CGA PT Short Term Goal 2 (Week 1): pt will transfer sit<>stand with LRAD and min A PT Short Term Goal 3 (Week 1): pt will transfer bed<>chair with LRAD and CGA  Skilled Therapeutic Interventions/Progress Updates:      Pt sitting in w/c to start - agreeable to therapy session. Patient hard of hearing. No reports of pain, just generalized fatigue. Transported patient out of her room to day room rehab gym. Pt reports "exercise isn't made for old people" and "I need 4-5 cocktails after this session, maybe a margarita"   Completed SB transfer with modA from w/c to mat table, towards her R side. Able to sit edge of mat with SBA while having platform under her RLE to improve support and balance. Completed seated there-ex with 3# dowel rod - 1x10 shoulder press + 1x10 chest press. Patient quick to fatigue, benefited from distraction and casual conversation during exercises.   Pt inquiring about her weight - used standing scale to stand with minA (pulling to stand) and stood with CGA on standing scale. Weight 119# - charted in flowsheets. Able to maintain standing for ~45 seconds before needing to sit. Assisted back to her w/c via SB transfer minA overall. Returned to room, ended session seated in w/c with safety belt alarm on. Call bell within reach.    Therapy Documentation Precautions:  Precautions Precautions: Fall Precaution Comments: monitor BP and O2 Restrictions Weight Bearing Restrictions: Yes LLE Weight Bearing: Non weight bearing General:     Therapy/Group: Individual Therapy  Trianna Lupien P Burma Ketcher  PT, DPT, CSRS  01/17/2023, 10:42 AM

## 2023-01-17 NOTE — IPOC Note (Signed)
Overall Plan of Care [facility]) Patient Details Name: Cassandra [name] MRN: [id number] DOB: 07-24-1933  Admitting Diagnosis: Left below-knee amputee [facility])  Hospital Problems: Principal Problem:   Left below-knee amputee [facility])     Functional Problem List: Nursing Bladder, Bowel, Edema, Endurance, Medication Management, Nutrition, Pain, Safety, Skin Integrity  PT Balance, Endurance, Motor, Nutrition, Pain, Sensory, Skin Integrity  OT Balance, Cognition, Edema, Endurance, Motor, Pain, Skin Integrity, Sensory  SLP    TR         Basic ADL's: OT Bathing, Dressing, Toileting     Advanced  ADL's: OT       Transfers: PT Bed Mobility, Bed to Chair, Car, Occupational psychologist, Research scientist (life sciences): PT Ambulation, Psychologist, prison and probation services, Stairs     Additional Impairments: OT None  SLP        TR      Anticipated Outcomes Item Anticipated Outcome  Self Feeding Mod I  Swallowing      Basic self-care  Marketing executive Transfers Supervision  Bowel/Bladder  Patient will be continent of bowel and bladder with min assist  Transfers  supervision with LRAD  Locomotion  CGA with LRAD  Communication     Cognition     Pain  Pain will be less than or equal to 4/10  Safety/Judgment  Patient will be free from falls/injury and making sound safety decisions with min asssist   Therapy Plan: PT Intensity: Minimum of 1-2 x/day ,45 to 90 minutes PT Frequency: 5 out of 7 days PT Duration Estimated Length of Stay: 2 weeks OT Intensity: Minimum of 1-2 x/day, 45 to 90 minutes OT Frequency: 5 out of 7 days OT Duration/Estimated Length of Stay: 2 weeks     Team Interventions: Nursing Interventions Patient/Family Education, Bladder Management, Bowel Management, Pain Management, Medication Management, Skin Care/Wound Management, Discharge Planning  PT interventions Ambulation/gait training, Discharge planning, Functional mobility training, Psychosocial  support, Therapeutic Activities, Visual/perceptual remediation/compensation, Balance/vestibular training, Disease management/prevention, Neuromuscular re-education, Skin care/wound management, Therapeutic Exercise, Wheelchair propulsion/positioning, Cognitive remediation/compensation, DME/adaptive equipment instruction, Pain management, Splinting/orthotics, UE/LE Strength taining/ROM, Community reintegration, Equities trader education, UE/LE Coordination activities, Museum/gallery curator  OT Interventions Warden/ranger, Discharge planning, Pain management, Self Care/advanced ADL retraining, Therapeutic Activities, UE/LE Coordination activities, Cognitive remediation/compensation, Disease mangement/prevention, Functional mobility training, Patient/family education, Skin care/wound managment, Therapeutic Exercise, DME/adaptive equipment instruction, Neuromuscular re-education, Psychosocial support, Splinting/orthotics, UE/LE Strength taining/ROM, Wheelchair propulsion/positioning  SLP Interventions    TR Interventions    SW/CM Interventions Discharge Planning, Psychosocial Support, Patient/Family Education, Disease Management/Prevention   Barriers to Discharge MD  Medical stability  Nursing Decreased caregiver support, Weight bearing restrictions 1 level/level entry w spouse  PT Wound Care, Weight bearing restrictions, Other (comments) pain, BP, NWB LLE, decreased endurance  OT Home environment access/layout, Wound Care, Weight bearing restrictions, New oxygen    SLP      SW Insurance for SNF coverage     Team Discharge Planning: Destination: PT-Home ,OT- Home , SLP-  Projected Follow-up: PT-Home health PT, OT-  Home health OT, 24 hour supervision/assistance, SLP-  Projected Equipment Needs: PT-To be determined, OT- To be determined, SLP-  Equipment Details: PT-has RW, WC, rollator, and multiple canes, OT-  Patient/family involved in discharge planning: PT- Patient,  OT-Patient, SLP-    MD ELOS: 10-14 days S/MinA Medical Rehab Prognosis:  Excellent Assessment: The patient has been admitted for CIR therapies with the diagnosis of left BKA secondary to osteomyelitis. The team will be  addressing functional mobility, strength, stamina, balance, safety, adaptive techniques and equipment, self-care, bowel and bladder mgt, patient and caregiver education. Goals have been set at supervision to [place]. Anticipated discharge destination is home.         See Team Conference Notes for weekly updates to the plan of care

## 2023-01-17 NOTE — Plan of Care (Signed)
  Problem: Consults Goal: RH LIMB LOSS PATIENT EDUCATION Description: Description: See Patient Education module for eduction specifics. Outcome: Progressing   Problem: RH BOWEL ELIMINATION Goal: RH STG MANAGE BOWEL WITH ASSISTANCE Description: STG Manage Bowel with mod I Assistance. Outcome: Progressing Goal: RH STG MANAGE BOWEL W/MEDICATION W/ASSISTANCE Description: STG Manage Bowel with Medication with mod I Assistance. Outcome: Progressing   Problem: RH SKIN INTEGRITY Goal: RH STG SKIN FREE OF INFECTION/BREAKDOWN Description: With min assist Outcome: Progressing Goal: RH STG ABLE TO PERFORM INCISION/WOUND CARE W/ASSISTANCE Description: STG Able To Perform Incision/Wound Care With min Assistance. Outcome: Progressing   Problem: RH SAFETY Goal: RH STG ADHERE TO SAFETY PRECAUTIONS W/ASSISTANCE/DEVICE Description: STG Adhere to Safety Precautions With cues Assistance/Device. Outcome: Progressing   Problem: RH PAIN MANAGEMENT Goal: RH STG PAIN MANAGED AT OR BELOW PT'S PAIN GOAL Description: < 4 with prns Outcome: Progressing   Problem: RH KNOWLEDGE DEFICIT LIMB LOSS Goal: RH STG INCREASE KNOWLEDGE OF SELF CARE AFTER LIMB LOSS Description: Manage care using educational resources independently Outcome: Progressing   

## 2023-01-17 NOTE — Progress Notes (Signed)
Physical Therapy Session Note  Patient Details  Name: Cassandra [name] MRN: [id number] Date of Birth: 14-Jun-1933  Today's Date: 01/17/2023 PT Individual Time: 0731-0826 and 6295-2841 PT Individual Time Calculation (min): 55 min and 44 min  Short Term Goals: Week 1:  PT Short Term Goal 1 (Week 1): pt will transfer supine<>sitting EOB with CGA PT Short Term Goal 2 (Week 1): pt will transfer sit<>stand with LRAD and min A PT Short Term Goal 3 (Week 1): pt will transfer bed<>chair with LRAD and CGA  Skilled Therapeutic Interventions/Progress Updates:   Treatment Session 1 Received pt sitting upright in bed asleep. Upon wakening, pt agreeable to PT treatment, and reported pain in L hip from "rough" handling while toileting with nursing yesterday and intermittent phantom limb spasms. Session with emphasis on dressing, functional mobility/transfers, generalized strengthening and endurance, dynamic standing balance/coordination. Pt bridged for therapist to remove soiled brief and performed pericare from bed level with set up assist. Rolled L/R with supervision and use of bedrails to don clean brief and donned pants in supine (with max A to thread L limb) and increased time - pt able to bridge to pull pants over hips without assist. Donned L limb guard with total A and pt transferred semi-reclined<>sitting EOB with HOB elevated, use of bedrails, and increased time with supervision. Doffed dirty shirt and donned clean one with supervision and increased time - pt returning to supine twice while dressing to rest due to fatigue and pain in hips - requested k-pad from MD and pain medicine from RN.   Pt stood from EOB with youth RW and heavy min/light mod A. Attempted to pivot into WC, however pt unable to pivot on R foot and becoming increasingly fatigued while standing. Returned to sitting and transferred bed<>WC via slideboard with min A and increased time. Pt required total A to place board and cues for hand  placement, anterior weight shifting, and pushing RLE through floor. Pt reported dizziness and breathing heavily. Increased time spent obtaining accurate vital signs as BP readings: 104/46, 86/45, 110/42, and finally 125/49 with SPO2 eventually reading 92%. Concluded session with pt sitting in WC, needs within reach, and seatbelt alarm on. Provided pt with ensure as pt has not been eating much due to issues with dentures (MD aware and adjusting diet consistency). Encouraged fluid intake due to dehydration from frequent loose stools yesterday. RN present in room attending to care.   Of note, pt requires significantly increased time with mobility due to fatigue, decreased endurance, deconditioning, and SOB. Have educated pt on benefits of OOB mobility and strongly encouraged pt to remain sitting up in between therapy sessions.  Treatment Session 2 Received pt semi-reclined in bed asleep with husband present at bedside. Pt required increased time to arouse but agreeable to PT treatment and did not state pain level during session. Session with emphasis on functional mobility/transfers, generalized strengthening and endurance, dynamic standing balance/coordination. Donned R shoe with supervision and limb guard with max A and increased time and pt transferred semi-reclined<>sitting EOB with HOB elevated and use of bedrails with supervision and increased time. Pt continues to remain confused (particularly with events from previous therapy sessions) - MD aware and decreasing pain medication due to side effects. Pt transferred bed<>WC via slideboard with heavy min/light mod A and same cues mentioned above for technique. Pt transported to/from room in [facility] dependently and performed x 1 sit<>stand with RW and mod A with cues for hand placement and technique (noted poor recall of  education). Pt politely refused any further standing attempts due to "soreness" in R leg. Returned to room and concluded session with pt sitting in  WC, needs within reach, and seatbelt alarm on.   Therapy Documentation Precautions:  Precautions Precautions: Fall Precaution Comments: monitor BP and O2 Restrictions Weight Bearing Restrictions: Yes LLE Weight Bearing: Non weight bearing  Therapy/Group: Individual Therapy [name] PT, DPT 01/17/2023, 6:55 AM
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
## 2023-01-17 NOTE — Progress Notes (Signed)
Occupational Therapy Session Note  Patient Details  Name: Cassandra [name] MRN: [id number] Date of Birth: 05/13/1933  Today's Date: 01/17/2023 OT Individual Time: 4098-1191 & 1435-1500 OT Individual Time Calculation (min): 70 min  & 25 min   Short Term Goals: Week 1:  OT Short Term Goal 1 (Week 1): Pt will complete sit > stand in prep for ADL with Min A using LRAD OT Short Term Goal 2 (Week 1): Pt will complete 1/3 toileting steps with CGA for balance OT Short Term Goal 3 (Week 1): Pt will complete toilet transfer with min A using LRAD  Skilled Therapeutic Interventions/Progress Updates:  Session 1 Skilled OT intervention completed with focus on toileting needs/education, activity tolerance, functional transfers. Pt received seated in w/c, agreeable to session. Unrated intermittent phantom pain reported in L residual limb; pre-medicated. OT offered rest breaks, repositioning and distraction throughout for pain reduction.  Pt remained very confused with intermittent hallucinations throughout session; MD made aware as pt also with significant dry mouth/lips upon entry with husband requesting that meds be adjusted to allow increased awareness, participation and alertness for therapy progression; OT in agreement. Issued pt vaseline for lips and fresh water.  Pt verbalized potential need to void. Transported dependently in w/c > BSC over toilet. Max cues needed for sequencing, motor planning and all functional tasks with frequent hand over hand assist needed as pt with poor command following and attention. Mod A squat pivot using grab bar to [facility], then pt was able to semi push up with BUE on arm rest of BSC for total A doffing of LB clothing. Heavy incontinent void in brief noted; pt unaware. Able to void continently on commode with very small BM. Able to wipe with supervision in front, then max A needed for posterior region using anterior lean. Push up again with CGA for balance during total A  threading of brief. Total A doffing of limb guard on L limb for time, then pt needed mod A to donn pants using push up method. Pt verbalized dizziness with 93/66 reading while seated, O2 sat unable to read due to cold fingers however pt only mildly SOB with exertion and improved with rest. Pt declined usage of TEDs for BP management; OT encouraged fluids.  Mod A squat pivot > w/c using grab bar. Seated at sink, pt was able to wash hands, face with set up A. Doffed/donned shirt with supervision. NT in room for CBG check. Max A squat pivot to EOB as pt with poor command following of putting R foot fully on floor vs tip toes, and posterior LOB when transitioning to bed. Able to recover with min A, and then CGA to transition to supine. Pt immediately fell asleep as soon as she laid down. Pt remained semi-upright in bed, with bed alarm on/activated, husband present and with all needs in reach at end of session.  Session 2 Skilled OT intervention completed with focus on functional transfers. Pt received seated in w/c. No pain reported.  Pt agreeable to OT assisting her back to bed, politely declining other activities. Per nursing request, OT tried to retrieve O2 sats with dynamap and pulse ox with use of heat pack to increase warmth of her finger for accurate reading, however despite multiple attempts and several mins, unable to get accurate reading. 72% SPO2 noted, however pt without acute distress or SOB and denied symptoms. HR taken manually at 65 bpm (radially) though dynamap indicated 42 bpm. Nurse notified of status. Pt remained on RA  as received.  Pt was able to doff limb guard with supervision, then with total A positioning of w/c, attempted 1 squat pivot however pt indicated fatigue and noted poor motor planning/command following. Max cues (tactile and verbal) needed for placing R foot on floor, scooting forward, then able to max A squat pivot to EOB using bed rail for hand assist. Supervision transition  to supine.  Pt remained upright in bed, with bed alarm on/activated, and with all needs in reach at end of session.   Therapy Documentation Precautions:  Precautions Precautions: Fall Precaution Comments: monitor BP and O2 Restrictions Weight Bearing Restrictions: Yes LLE Weight Bearing: Non weight bearing    Therapy/Group: Individual Therapy  Melvyn Novas, MS, OTR/L  01/17/2023, 3:02 PM

## 2023-01-18 LAB — GLUCOSE, CAPILLARY
Glucose-Capillary: 98 mg/dL (ref 70–99)
Glucose-Capillary: 150 mg/dL — ABNORMAL HIGH (ref 70–99)
Glucose-Capillary: 138 mg/dL — ABNORMAL HIGH (ref 70–99)
Glucose-Capillary: 127 mg/dL — ABNORMAL HIGH (ref 70–99)

## 2023-01-18 LAB — URINALYSIS, ROUTINE W REFLEX MICROSCOPIC
Bilirubin Urine: NEGATIVE
Glucose, UA: NEGATIVE mg/dL
Hgb urine dipstick: NEGATIVE
Ketones, ur: NEGATIVE mg/dL
Nitrite: NEGATIVE
Protein, ur: NEGATIVE mg/dL
Specific Gravity, Urine: 1.012 (ref 1.005–1.030)
pH: 5 (ref 5.0–8.0)
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
## 2023-01-18 NOTE — Progress Notes (Signed)
Physical Therapy Session Note  Patient Details  Name: Cassandra [name] MRN: [id number] Date of Birth: [birth year]  Attempted to see patient to make up missed time from earlier this week. Patient in bed with family member at the bedside. Patient unwilling to participate as this is her "rest" day and she was told she wouldn't have therapy. Unable to encourage patient to mobilize or participate.   Delisha Peaden P Malin Cervini PT 01/18/2023, 1:19 PM

## 2023-01-19 LAB — CBC WITH DIFFERENTIAL/PLATELET
Abs Immature Granulocytes: 0.25 10*3/uL — ABNORMAL HIGH (ref 0.00–0.07)
Basophils Absolute: 0.1 10*3/uL (ref 0.0–0.1)
Basophils Relative: 1 %
Eosinophils Absolute: 0.8 10*3/uL — ABNORMAL HIGH (ref 0.0–0.5)
Eosinophils Relative: 6 %
HCT: 26.6 % — ABNORMAL LOW (ref 36.0–46.0)
Hemoglobin: 8.2 g/dL — ABNORMAL LOW (ref 12.0–15.0)
Immature Granulocytes: 2 %
Lymphocytes Relative: 14 %
Lymphs Abs: 1.9 10*3/uL (ref 0.7–4.0)
MCH: 27 pg (ref 26.0–34.0)
MCHC: 30.8 g/dL (ref 30.0–36.0)
MCV: 87.5 fL (ref 80.0–100.0)
Monocytes Absolute: 1.3 10*3/uL — ABNORMAL HIGH (ref 0.1–1.0)
Monocytes Relative: 10 %
Neutro Abs: 9.4 10*3/uL — ABNORMAL HIGH (ref 1.7–7.7)
Neutrophils Relative %: 67 %
Platelets: 509 10*3/uL — ABNORMAL HIGH (ref 150–400)
RBC: 3.04 MIL/uL — ABNORMAL LOW (ref 3.87–5.11)
RDW: 16.1 % — ABNORMAL HIGH (ref 11.5–15.5)
WBC: 13.8 10*3/uL — ABNORMAL HIGH (ref 4.0–10.5)
nRBC: 0 % (ref 0.0–0.2)

## 2023-01-19 LAB — URINE CULTURE

## 2023-01-19 LAB — COMPREHENSIVE METABOLIC PANEL
ALT: 11 U/L (ref 0–44)
AST: 18 U/L (ref 15–41)
Albumin: 2.2 g/dL — ABNORMAL LOW (ref 3.5–5.0)
Alkaline Phosphatase: 65 U/L (ref 38–126)
Anion gap: 10 (ref 5–15)
BUN: 49 mg/dL — ABNORMAL HIGH (ref 8–23)
CO2: 23 mmol/L (ref 22–32)
Calcium: 9.1 mg/dL (ref 8.9–10.3)
Chloride: 103 mmol/L (ref 98–111)
Creatinine, Ser: 1.58 mg/dL — ABNORMAL HIGH (ref 0.44–1.00)
GFR, Estimated: 31 mL/min — ABNORMAL LOW (ref >60)
Glucose, Bld: 93 mg/dL (ref 70–99)
Potassium: 4.7 mmol/L (ref 3.5–5.1)
Sodium: 136 mmol/L (ref 135–145)
Total Bilirubin: 0.7 mg/dL (ref 0.3–1.2)
Total Protein: 6.5 g/dL (ref 6.5–8.1)

## 2023-01-19 LAB — GLUCOSE, CAPILLARY
Glucose-Capillary: 84 mg/dL (ref 70–99)
Glucose-Capillary: 170 mg/dL — ABNORMAL HIGH (ref 70–99)
Glucose-Capillary: 96 mg/dL (ref 70–99)

## 2023-01-19 NOTE — Progress Notes (Signed)
Physical Therapy Session Note  Patient Details  Name: Cassandra [name] MRN: [id number] Date of Birth: 01-26-1933  Today's Date: 01/19/2023 PT Individual Time: 2440-1027 PT Individual Time Calculation (min): 56 min   Short Term Goals: Week 1:  PT Short Term Goal 1 (Week 1): pt will transfer supine<>sitting EOB with CGA PT Short Term Goal 2 (Week 1): pt will transfer sit<>stand with LRAD and min A PT Short Term Goal 3 (Week 1): pt will transfer bed<>chair with LRAD and CGA  Skilled Therapeutic Interventions/Progress Updates:      Pt seated in WC upon arrival Pt agreeable to therapy. Pt reports 8/10 aching pain in posterior aspect of L LE, pt reports she thinks it is from sitting up in the chair and worn out from therapy.  Therapist requesting pain medicine near end of session, nursing notified.   Pt BP 129/46 HR 79 seated in WC. Pt transported dependent in [facility] for time/energy conservation. Pt performed squat pivot transfer WC<>mat table with mod A, verbal and tactile cues provided for technique. Pt reports dizziness upon performing slide board transfer and immediately lying down in supine on mat table with supervision, BP 154/47, HR 79 when lying down. Pt performed the following supine therex for LE strenghtening: 1x10 bridging, 1x10 L hip abduction, 1x10 SLRs, verbal and tactile cues provided for completion through pt full range, and to reduce compensation.   Pt performed supine to sit with supervision, pt reports dizziness upon sitting EOM, BP 81/61 HR 80. Therapist encouraged pt to drink water. Dizziness subsided within few minutes and BP 130/47 HR 83.  Pt performed slide board transfer with CGA, pt denies any dizziness throughout remained of session.,   Pt fatigued throughout session, rest breaks provided for fatigue.   Pt requesting to get back in bed at end of session. Education provided for importance of sitting upright to improve upright tolerance and reduce dizziness, pt verbalized  understanding but still requesting to return to bed. Pt transported dependent in [facility] for time/energy conservation.   Pt performed slide board transfer with min A 2/2 fatigue and posterior trunk lean. Pt performed sit<>supine with supervision. Pt doffed L LE limb protector with supervision and increased time. Pt supine in bed  with HOB elevated for hemodynamic stability. Bed alarm on and needs within reach, and nurse in room at end of session.    Therapy Documentation Precautions:  Precautions Precautions: Fall Precaution Comments: monitor BP and O2 Restrictions Weight Bearing Restrictions: Yes LLE Weight Bearing: Non weight bearing    Therapy/Group: Individual Therapy  [facility], , DPT  01/19/2023, 7:28 AM

## 2023-01-19 NOTE — Progress Notes (Signed)
Physical Therapy Session Note  Patient Details  Name: Cassandra [name] MRN: [id number] Date of Birth: 06-Jan-1933  Today's Date: 01/19/2023 PT Individual Time: 0104-0219 PT Individual Time Calculation (min): 75 min   Short Term Goals: Week 1:  PT Short Term Goal 1 (Week 1): pt will transfer supine<>sitting EOB with CGA PT Short Term Goal 2 (Week 1): pt will transfer sit<>stand with LRAD and min A PT Short Term Goal 3 (Week 1): pt will transfer bed<>chair with LRAD and CGA  Skilled Therapeutic Interventions/Progress Updates:   Pt in bed upon PT arrival. Initially attempted to decline therapy due to being tired and did not want to get out of bed. She agreed to therex in the bed. No c/o pain. Visitor present.  All Therex supine. 1 lb dowel: chest press x 20, bicep curls x 20, tricep ext x 20, shoulder flex/ext x 20, dowel reaches various angles x 15 each angle. Shoulder ext with elbow flexed and then extended x 20 each. Shoulder depression bil x 20. Shoulder retraction bil x 20. SAQ x 20 R. SLR R x 20. R ISO SAQ with ankle pump x 30. Bridge R x 20. L QS x 20; L SLR x 20; L march x 20. Pt reports soreness in L adductor from several days ago after therex. Gentle L Adductor stretching performed various angles x 30 sec hold each angle. Pt needed max verbal cueing for L LE relaxation due to not realizing she was tense.  Bil SLR/hip abduction/adduction combo unilat bil x 20 off of towel roll. L LE lifts various angles for global L LE strengthening x 15 each angle. Core stabilization exercises performed x 20 each (reaches various angles). Pt needed verbal cues to stay on task and tactile cues for proper technique. Pt needed max verbal cues to not use accessory muscles, especially cervical muscles, to perform therex. Answered anatomy questions during therex from patient and visitor.   Pt left in bed with visitor still present. Bed Alarm on. Call bell within reach. No complaints of pain.        Therapy  Documentation Precautions:  Precautions Precautions: Fall Precaution Comments: monitor BP and O2 Restrictions Weight Bearing Restrictions: Yes LLE Weight Bearing: Non weight bearing      Therapy/Group: Individual Therapy  Luna Fuse 01/19/2023, 3:41 PM
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
## 2023-01-19 NOTE — Progress Notes (Signed)
Occupational Therapy Session Note  Patient Details  Name: Cassandra [name] MRN: [id number] Date of Birth: 1933-07-25  Today's Date: 01/19/2023 OT Individual Time: 7829-5621 OT Individual Time Calculation (min): 55 min  and Today's Date: 01/19/2023 OT Missed Time: 15 Minutes Missed Time Reason: Other (comment) (scheduling conflict)   Short Term Goals: Week 1:  OT Short Term Goal 1 (Week 1): Pt will complete sit > stand in prep for ADL with Min A using LRAD OT Short Term Goal 2 (Week 1): Pt will complete 1/3 toileting steps with CGA for balance OT Short Term Goal 3 (Week 1): Pt will complete toilet transfer with min A using LRAD   Skilled Therapeutic Interventions/Progress Updates:    Pt bed level at time of session, agreeable to OT with encouragement. Missed 15 mins at beginning of session 2/2 scheduling conflict. No c/o pain. Initial focus of session on rapport building and limb loss education regarding phantom pain, importance of shrinker, etc. Pt able to don shrinker bed level before activity. Pt declining ADL and changing clothes, note dietary present at this time and pt needing extended time to order 2/2 preferences. Donning limb guard with MOD A and education on importance, bed mobility CGA for scooting to EOB. Pt reporting dizziness, checked BP at 121/96 with HR at 82 and unable to get O2 reading - pt states this is normal for her. Total A for slide board placement, MIN/CGA for slide board transfer to wheelchair. Set up at sink for oral hygiene/grooming tasks with supervision, neeidng assist to clean up after 2/2 patient reports visual deficits making difficult to place toothpaste. Set up in wheelchiar alarm on, aware of next PT session and to sit up for that, residual limb supported, call bell in reach.   Therapy Documentation Precautions:  Precautions Precautions: Fall Precaution Comments: monitor BP and O2 Restrictions Weight Bearing Restrictions: Yes LLE Weight Bearing: Non  weight bearing     Therapy/Group: Individual Therapy  Erasmo Score 01/19/2023, 7:41 AM

## 2023-01-20 LAB — GLUCOSE, CAPILLARY
Glucose-Capillary: 80 mg/dL (ref 70–99)
Glucose-Capillary: 124 mg/dL — ABNORMAL HIGH (ref 70–99)
Glucose-Capillary: 86 mg/dL (ref 70–99)
Glucose-Capillary: 125 mg/dL — ABNORMAL HIGH (ref 70–99)

## 2023-01-20 MED ORDER — BENZOCAINE 10 % MT GEL
Freq: Two times a day (BID) | OROMUCOSAL | Status: DC | PRN
  Filled 2023-01-20: qty 9

## 2023-01-20 MED ORDER — PSYLLIUM 95 % PO PACK
1.0000 | PACK | Freq: Every day | ORAL | Status: DC
  Administered 2023-01-20 – 2023-01-29 (×10): 1 via ORAL
  Filled 2023-01-20 (×11): qty 1

## 2023-01-20 NOTE — Progress Notes (Signed)
Physical Therapy Session Note  Patient Details  Name: Cassandra [name] MRN: [id number] Date of Birth: May 12, 1933  Today's Date: 01/20/2023 PT Individual Time: 4098-1191 PT Individual Time Calculation (min): 70 min   Short Term Goals: Week 1:  PT Short Term Goal 1 (Week 1): pt will transfer supine<>sitting EOB with CGA PT Short Term Goal 2 (Week 1): pt will transfer sit<>stand with LRAD and min A PT Short Term Goal 3 (Week 1): pt will transfer bed<>chair with LRAD and CGA  Skilled Therapeutic Interventions/Progress Updates:   Received pt sitting in recliner with RN present at bedside. Pt removed dentures for RN to apply oral pain reliever. Pt agreeable to PT treatment and did not state pain level during session. Session with emphasis on functional mobility/transfers, generalized strengthening and endurance, and dynamic standing balance/coordination. Pt stood from recliner with RW and mod A to remove Roho cushion, as pt sliding anteriorly out of chair. Stood again with RW and mod A (cue for hand placement) and attempted stand<>pivot transfer from recliner<>WC, however pt unable to pivot on R foot for "hop". Returned to sitting and instead performed stand/squat<>pivot transfer from recliner<>WC with max A as pt landing on WC wheel and having difficulty with sequencing/motor planning transfer. Noted skin tear on pt's R leg - cleaned up leg and applied foam dressing and notified RN.   Pt performed WC mobility 4ft using BUE and supervision with multiple rest breaks - emphasis on UE strength and cardiovascular endurance. Pt limited by fatigue and transported remainder of way to/from dayroom in [facility] dependently. Pt transferred WC<>mat via slideboard to L (and uphill) with mod A and max multimodal cues for anterior weight shifting, R foot placement on floor, and scooting technique. Pt reported feeling nauseous - provided pt with water but no emesis episode occurred. Pt performed LAQ on RLE 2x20 with 0.75lb  ankle weight and R hip flexion 2x15 with 0.75lb ankle weight with cues for eccentric control. Pt transferred mat<>WC via slideboard to R and downhill with CGA and returned to room. RN requested pt return to bed to further inspect skin tear and pt transferred WC<>bed via slideboard with CGA with increased time and sit<>supine with supervision. Concluded session with pt semi-reclined in bed, needs within reach, and bed alarm on.   Therapy Documentation Precautions:  Precautions Precautions: Fall Precaution Comments: monitor BP and O2 Restrictions Weight Bearing Restrictions: Yes LLE Weight Bearing: Non weight bearing  Therapy/Group: Individual Therapy [name] PT, DPT 01/20/2023, 6:58 AM
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
## 2023-01-20 NOTE — Progress Notes (Signed)
Patient ID: Cassandra [name], female   DOB: [birth year], 89 y.o.   MRN: [id number] Met with the patient to review situation, and plan of care. Sutures removed by nurse per order from orthopedics. Poor appetite; discussed options to increase protein intake and calcium stores. Reports feeling ok but weak and residual limb is very sensitive; she can't keep it still.  Reviewed secondary risk management, reports checking blood glucose PTA and taking medications. Having difficulties obtaining O2 sats due to cold extremities; reported "wearing oxygen at home at night and during the day if needed" PTA.  Continue to follow along to address educational needs to facilitate preparation for discharge. Cassandra [name]

## 2023-01-20 NOTE — Progress Notes (Signed)
Occupational Therapy Session Note  Patient Details  Name: Cassandra [name] MRN: [id number] Date of Birth: 04-08-1933  Today's Date: 01/20/2023 OT Individual Time: 0803-0900 & 4098-1191 OT Individual Time Calculation (min): 57 min & 71 min   Short Term Goals: Week 1:  OT Short Term Goal 1 (Week 1): Pt will complete sit > stand in prep for ADL with Min A using LRAD OT Short Term Goal 2 (Week 1): Pt will complete 1/3 toileting steps with CGA for balance OT Short Term Goal 3 (Week 1): Pt will complete toilet transfer with min A using LRAD  Skilled Therapeutic Interventions/Progress Updates:  Session 1 Skilled OT intervention completed with focus on ADL retraining, functional transfers and sit > stands. Pt received supine in bed with nursing assisting with brief change, agreeable to session. Intermittent spasm pain reported in Lt limb; pre-medicated. OT offered rest breaks, repositioning and distraction throughout for pain reduction.  Pt doffed 2.5 L of supplemental O2 via [place] at start of session. Mild SOB with activity on RA that recovered with rest, however poor reading on pulse ox and unable to determine full sat but pt denied acute distress/discomfort. Remained on RA at end of session.  Pt was more oriented and alert this session however still with mod difficulty redirecting to tasks but anticipate pt is scattered at baseline personality wise.  Transitioned to EOB with CGA for trunk elevation. Pt reported dizziness after sitting EOB for several mins. BP assessed at 132/77, symptoms improved with water consumption and prolonged sit. Doffed/donned shirt, washed face with supervision and threaded pants with CGA for sitting balance due to posterior bias. Donned L limb guard with max A for time. Encouraged pt to stand for donning of pants. Significant time to motor plan/coordinate scooting forward for R foot to be on floor, however able to stand with light min A using RW but noted RLE pushing  against bed. Able to maintain stance with CGA for total A donning of pants over hips. When instructed to trial sit > stand again without compensatory balance method of R leg on bed, pt needed mod A to stand and unable to maintain erect for stand pivot transfer.  OT encouraged recliner for OOB tolerance in between sessions, pt agreeable. Able to lean > L with CGA for total A placement of board. Then CGA increasing to min A SB transfer due to uneven board on roho cushion and max cues needed for safety when recliner shifted though brakes were locked. Mod A needed to position hips evenly and max A to scoot back onto w/c cushion for pressure relief. Pillows placed behind back for comfort. MD present for rounds.  Pt remained upright in recliner, with belt alarm on/activated, and with all needs in reach at end of session.  Session 2 Skilled OT intervention completed with focus on functional transfers, toileting education, wound care. Pt received upright in bed, expressed fatigue but agreeable to get OOB to try and use bathroom. Intermittent pain reported in L residual limb; pre-medicated. OT offered rest breaks, repositioning and distraction throughout for pain reduction.  Transitioned to EOB with supervision with time. Leaned > L with CGA for total A placement of SB. Initial CGA increasing to min A to get over roho cushion hump to center into w/c. Discovered that R arm rest would lift 2/2 being jammed, and pt in larger w/c than her current 16"x16" at home, therefore OT retrieved such w/c with flip back arm rests to increase efficiency and independence with  SB and squat pivot transfers during toilet as she has to transfer to R side. Min A slide board transfer to L side from w/c > other w/c with 2nd person stabilizing w/c.   Transported dependently to [facility] over toilet. Mod A squat pivot > BBSC using grab bars then semi stand with min A for doffing of pants at total A. Continent of void only. Able to wipe  anteriorly with cues for posterior lean for ease of access. Threaded new brief per request with total A. Pt with difficulty motor planning lateral leans for independent donning of LB clothing, as well as arm rest push up with pt initiating using grab bar to stand with with both hands though advised not to do this. Max A to donn pants over hips, min A for balance, then mod A squat/stand pivot to w/c. Pt completed hand hygiene with set up A.   R shin bandage with skin tear underneath noted to have bled through. Notified nursing who was present to place steri strips and new bandage. OT assisted with positioning of RLE in elevated position for comfort. Pt then self-propelled about 50 ft with supervision, mod cues for pushing BUE with longer strides for accuracy with steering. Transported back to room, then completed min A SB transfer > L to EOB. Transitioned supine with CGA, CGA to pull on both lower bed rails to achieve long sitting position then max A to scoot hips back towards HOB.    Pt remained semi upright in bed, with bed alarm on/activated, and with all needs in reach at end of session.   Therapy Documentation Precautions:  Precautions Precautions: Fall Precaution Comments: monitor BP and O2 Restrictions Weight Bearing Restrictions: Yes LLE Weight Bearing: Non weight bearing    Therapy/Group: Individual Therapy  Melvyn Novas, MS, OTR/L  01/20/2023, 3:35 PM

## 2023-01-21 LAB — GLUCOSE, CAPILLARY
Glucose-Capillary: 63 mg/dL — ABNORMAL LOW (ref 70–99)
Glucose-Capillary: 63 mg/dL — ABNORMAL LOW (ref 70–99)
Glucose-Capillary: 77 mg/dL (ref 70–99)
Glucose-Capillary: 66 mg/dL — ABNORMAL LOW (ref 70–99)
Glucose-Capillary: 154 mg/dL — ABNORMAL HIGH (ref 70–99)
Glucose-Capillary: 107 mg/dL — ABNORMAL HIGH (ref 70–99)
Glucose-Capillary: 59 mg/dL — ABNORMAL LOW (ref 70–99)
Glucose-Capillary: 133 mg/dL — ABNORMAL HIGH (ref 70–99)

## 2023-01-21 LAB — VITAMIN D 25 HYDROXY (VIT D DEFICIENCY, FRACTURES)

## 2023-01-21 MED ORDER — INSULIN GLARGINE-YFGN 100 UNIT/ML ~~LOC~~ SOLN
4.0000 [IU] | Freq: Every day | SUBCUTANEOUS | Status: DC
  Filled 2023-01-21: qty 0.04

## 2023-01-21 MED ORDER — HYDROCORTISONE (PERIANAL) 2.5 % EX CREA
TOPICAL_CREAM | Freq: Two times a day (BID) | CUTANEOUS | Status: DC
  Filled 2023-01-21: qty 28.35

## 2023-01-21 MED ORDER — SENNA 8.6 MG PO TABS
1.0000 | ORAL_TABLET | Freq: Every day | ORAL | Status: DC
  Administered 2023-01-21 – 2023-01-30 (×10): 8.6 mg via ORAL
  Filled 2023-01-21 (×10): qty 1

## 2023-01-21 MED ORDER — METOPROLOL SUCCINATE ER 25 MG PO TB24
12.5000 mg | ORAL_TABLET | Freq: Every day | ORAL | Status: DC
  Administered 2023-01-21: 12.5 mg via ORAL
  Filled 2023-01-21 (×2): qty 1

## 2023-01-21 NOTE — Plan of Care (Signed)
  Problem: Consults Goal: RH LIMB LOSS PATIENT EDUCATION Description: Description: See Patient Education module for eduction specifics. Outcome: Progressing   Problem: RH BOWEL ELIMINATION Goal: RH STG MANAGE BOWEL WITH ASSISTANCE Description: STG Manage Bowel with mod I Assistance. Outcome: Progressing Goal: RH STG MANAGE BOWEL W/MEDICATION W/ASSISTANCE Description: STG Manage Bowel with Medication with mod I Assistance. Outcome: Progressing   Problem: RH SKIN INTEGRITY Goal: RH STG SKIN FREE OF INFECTION/BREAKDOWN Description: With min assist Outcome: Progressing Goal: RH STG ABLE TO PERFORM INCISION/WOUND CARE W/ASSISTANCE Description: STG Able To Perform Incision/Wound Care With min Assistance. Outcome: Progressing   Problem: RH SAFETY Goal: RH STG ADHERE TO SAFETY PRECAUTIONS W/ASSISTANCE/DEVICE Description: STG Adhere to Safety Precautions With cues Assistance/Device. Outcome: Progressing   Problem: RH PAIN MANAGEMENT Goal: RH STG PAIN MANAGED AT OR BELOW PT'S PAIN GOAL Description: < 4 with prns Outcome: Progressing   Problem: RH KNOWLEDGE DEFICIT LIMB LOSS Goal: RH STG INCREASE KNOWLEDGE OF SELF CARE AFTER LIMB LOSS Description: Manage care using educational resources independently Outcome: Progressing   

## 2023-01-21 NOTE — Progress Notes (Signed)
Orthopedic Tech Progress Note Patient Details:  Cassandra [name] 01/26/1933 [id number]  Called in order to [employer] for a BKA SHRINKER   Patient ID: Cassandra [name], female   DOB: Apr 13, 1933, 89 y.o.   MRN: [id number]  [name] 01/21/2023, 11:34 AM

## 2023-01-21 NOTE — Progress Notes (Signed)
Hypoglycemic Event  CBG: 66  Treatment: 4 oz juice/soda  Symptoms: None  Follow-up CBG: Time: 1152  CBG Result:154  Possible Reasons for Event: Unknown  Comments/MD notified: Dr. Carlis Abbott aware; new orders in place.    Elgie Congo

## 2023-01-21 NOTE — Progress Notes (Signed)
Occupational Therapy Weekly Progress Note  Patient Details  Name: Cassandra [name] MRN: [id number] Date of Birth: 30-Mar-1933  Beginning of progress report period: Jan 15, 2023 End of progress report period: Jan 21, 2023  Patient has met 2 of 3 short term goals. Pt is making gradual progress towards LTGs. She is able to bathe at an overall mod A level, dress UB with supervision and LB with mod/max A and requires mod assist for toileting tasks. Pt continues to demonstrate endurance, cognitive (awareness, problem solving, attention) and sitting/standing balance deficits resulting in difficulty completing BADL tasks without increased physical assist.  She has also been limited by phantom pain, dizziness, BP fluctuations, and arousal with MD involved to make changes to meds for increased activity tolerance. Pt will benefit from continued skilled OT services to focus on mentioned deficits. Husband has been present but will need extensive hands on education prior to DC.   Patient continues to demonstrate the following deficits: muscle weakness, decreased cardiorespiratoy endurance, decreased coordination and decreased motor planning, decreased visual acuity, decreased initiation, decreased awareness, decreased problem solving, decreased safety awareness, and decreased memory, and decreased sitting balance, decreased standing balance, and difficulty maintaining precautions and therefore will continue to benefit from skilled OT intervention to enhance overall performance with BADL and Reduce care partner burden.  Patient progressing toward long term goals..  Continue plan of care.  OT Short Term Goals Week 1:  OT Short Term Goal 1 (Week 1): Pt will complete sit > stand in prep for ADL with Min A using LRAD OT Short Term Goal 1 - Progress (Week 1): Progressing toward goal OT Short Term Goal 2 (Week 1): Pt will complete 1/3 toileting steps with CGA for balance OT Short Term Goal 2 - Progress (Week 1):  Met OT Short Term Goal 3 (Week 1): Pt will complete toilet transfer with min A using LRAD OT Short Term Goal 3 - Progress (Week 1): Met Week 2:  OT Short Term Goal 1 (Week 2): STG = LTG due to ELOS    Cassandra Wickham E Jackee Glasner, MS, OTR/L  01/21/2023, 10:05 AM

## 2023-01-21 NOTE — Progress Notes (Signed)
Hypoglycemic Event  CBG: 55    Symptoms: None  Follow-up CBG: Time:1702 CBG Result:107  Possible Reasons for Event: Unknown  Comments/MD notified:MD aware    Cassandra [name]

## 2023-01-21 NOTE — Progress Notes (Signed)
Hypoglycemic Event  CBG: 63  Treatment: 4 oz juice/soda  Symptoms: None  Follow-up CBG: Time:0631 CBG Result:63 Follow-up CBG Time 0655 CBG Result:77  Possible Reasons for Event: Unknown  Comments/MD notified: Sula Soda MD    Ena Dawley Rachel Moulds
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
## 2023-01-21 NOTE — Progress Notes (Signed)
Occupational Therapy Session Note  Patient Details  Name: Cassandra [name] MRN: [id number] Date of Birth: February 10, 1933  Today's Date: 01/21/2023 OT Individual Time: 4098-1191 & 4782-9562 OT Individual Time Calculation (min): 72 min & 68 min   Short Term Goals: Week 1:  OT Short Term Goal 1 (Week 1): Pt will complete sit > stand in prep for ADL with Min A using LRAD OT Short Term Goal 2 (Week 1): Pt will complete 1/3 toileting steps with CGA for balance OT Short Term Goal 3 (Week 1): Pt will complete toilet transfer with min A using LRAD  Skilled Therapeutic Interventions/Progress Updates:  Session 1 Skilled OT intervention completed with focus on ADL retraining. Pt received seated in w/c, expressed fatigue but agreeable to session. No pain reported at start of session.  Of note- pt required mod/max cues and increased time for all tasks during session due to poor sequencing, problem solving and awareness.  Prompted pt to trial void as she reports not going since yesterday.  Transported dependently in w/c to toilet. Min A squat pivot to [facility] over toilet using grab bars. Min A sit > stand using grab bar for total A doffing of pants for urgency. Incontinent/continent of void, small BM; nurse made aware. CGA for anterior lean during self wiping posteriorly, supervision for front wiping. Small amount of blood noted to be on wash cloth after wiping, pt indicating raw rectal region, therefore OT applied barrier cream and notified nurse for inspection however no sacral wound noted. Threaded brief through legs with cues only, then mod A sit > stand as pt with poor command following of lateral leans.  Significant time spent educating and having pt problem solve lateral leans for maximizing independence with LB dressing at seated level as pt with poor balance standing and anticipate may DC at w/c level for safety of pt and husband. Max cues needed, visual demonstration and CGA for sitting balance provided  with pt then able to donn pants over hips overall at min A level. Will need reinforcement of this.  Min A squat pivot to w/c with grab bar, hand hygiene with set up A at sink, then supervision for shaving chin/facial hair upon request. No nicks noted after activity. Doffed/donned shirt with set up A, as well as combed hair. Min A sit > stand using RW with posterior bias therefore needed max A to donn over hips while providing mod A for balance. Doffed current shrinker total A. Education provided about importance of changing and washing shrinkers daily as part of limb care. Mod A needed to donn new shrinker however 4X size noted to not have enough compressive benefit; MD requested to order 2X for trial.  Pt remained seated in w/c, with belt alarm on/activated, and with all needs in reach at end of session.  Session 2 Skilled OT intervention completed with focus on ADL retraining, limb care education and DC planning. Pt received upright in bed, agreeable to session. Discomfort with wiping reported during toileting; applied barrier cream, and MD notified for potential hemorrhoid meds. OT offered rest breaks, repositioning throughout for pain reduction.  Pt noted to have 2X shrinker on residual limb by [employer] application. Educated husband [name], and pt on purpose, wear schedule and care routine of shrinker.   Pt continued to require mod/max multimodal cues throughout session for sequencing, problem solving and awareness. Transitioned to EOB with supervision with time. Pt was able to place the SB under R hip with min A with education provided  about placing towards middle of buttocks vs back for safe sliding to w/c. Able to SB transfer with CGA only for w/c and board stabilization, when given time. CGA for centering hips into w/c. Transported dependently in w/c to commode. Initial min increasing to mod A squat pivot to [facility] over toilet, due to poor R foot rotation during pivot. Max A to doff pants. Continent of  void only. Able to wipe with supervision while seated. Threaded new brief with total A with pt laterally leaning with max cues. Attempted to have pt recall lateral lean technique for donning pants from seated level however pt with high fatigue and therefore required mod A to do. Mod A squat pivot to w/c due to prior issue with R foot.   Extensive time spent with [name] and pt about plans and goals for DC. Both expressed their desire for pt to be at a standing and short ambulatory level for increased independence at home. [name] reports that all bathrooms are not w/c accessible though they do have BSC for out of bathroom toileting. Discussed pt's slow but gradual progress towards LTGs of supervision. Advised that pt could DC at modified w/c level to ease caregiver burden however both are motivated for pt to be at higher level therefore may need extension to achieve functional needs for safe DC home. Home measurement sheet issued to [name] to return back to therapy team. Will discuss DC plans further with care team.  Pt remained seated in w/c, with belt alarm on/activated, and with all needs in reach at end of session.   Therapy Documentation Precautions:  Precautions Precautions: Fall Precaution Comments: monitor BP and O2 Restrictions Weight Bearing Restrictions: Yes LLE Weight Bearing: Non weight bearing    Therapy/Group: Individual Therapy  Melvyn Novas, MS, OTR/L  01/21/2023, 3:16 PM

## 2023-01-21 NOTE — Progress Notes (Signed)
Physical Therapy Session Note  Patient Details  Name: Cassandra [name] MRN: [id number] Date of Birth: 12-Dec-1932  Today's Date: 01/21/2023 PT Individual Time: 0800-0910 PT Individual Time Calculation (min): 70 min   Short Term Goals: Week 1:  PT Short Term Goal 1 (Week 1): pt will transfer supine<>sitting EOB with CGA PT Short Term Goal 2 (Week 1): pt will transfer sit<>stand with LRAD and min A PT Short Term Goal 3 (Week 1): pt will transfer bed<>chair with LRAD and CGA  Skilled Therapeutic Interventions/Progress Updates:      Pt in bed to start - finished her breakfast. No reports of pain but does report feeling "sore" from yesterday.   Donned pants at bed level with setupA - patient wanting to do as much for her self as she can for dressing. Able to pull pants over hips via bridging in bed.   Supine<>sitting EOB with supervision with HOB flat. Needs minA for forward scooting to EOB. SB transfer completed with minA (totalA for board placement) from EOB to w/c, towards her R side. Several small scoots needed to achieve transfer. Difficult for her to produce power through RLE due to height.  Pt propelled herself with distant supervision in w/c (using BUE) from her room to main rehab gym, ~175ft. X1 extended seated rest break needed and very slow speed. Would benefit from a K005 ultralight weight with improved axil alignment to maximize stroke efficiency as she will likely be a life long wheelchair user.   SB transfer to mat table in similar manner as above. Sit>supine without assist on flat mat table. Mat table there-ex completed: -2x10 single leg bridges -2x15 glut sets -2x15 SLR on L -2x15 hip abd on L -2x15 sidelying hip abd on L -2x15 sidelying hip ext on L  Assisted back to w/c via SB transfer with light minA - again patient wanting to do as much as she can for herself.   Transported patient back to her room and ended session seated in w/c with safety belt alarm on and call  bell within reach.  HEP printout provided:   Access Code: [email or web address] URL: [email or web address] Date: 01/21/2023 Prepared by: Wynelle Link  Exercises - Supine Quad Set (BKA)  - 1 x daily - 7 x weekly - 3 sets - 15 reps - Supine Gluteal Sets  - 1 x daily - 7 x weekly - 3 sets - 15 reps - Supine Active Straight Leg Raise  - 1 x daily - 7 x weekly - 3 sets - 15 reps - Prone Hip Extension with Residual Limb (BKA)  - 1 x daily - 7 x weekly - 3 sets - 15 reps - Seated Knee Extension (BKA)  - 1 x daily - 7 x weekly - 3 sets - 15 reps - Seated Chair Push Ups  - 1 x daily - 7 x weekly - 3 sets - 10 reps - Supine Hip Extension with Towel Roll (BKA)  - 3 x daily - 7 x weekly - 57min-1hr hold - Modified Thomas Stretch  - 2 x daily - 7 x weekly - 1 sets - 3 reps - 45 hold  Patient Education - Tapping - Rubbing with Different Textures   Therapy Documentation Precautions:  Precautions Precautions: Fall Precaution Comments: monitor BP and O2 Restrictions Weight Bearing Restrictions: Yes LLE Weight Bearing: Non weight bearing General:      Therapy/Group: Individual Therapy  Yarelly Kuba P Kimberle Stanfill  PT, DPT, CSRS  01/21/2023, 7:37 AM

## 2023-01-22 DIAGNOSIS — I6782 Cerebral ischemia: Secondary | ICD-10-CM

## 2023-01-22 DIAGNOSIS — F0670 Mild neurocognitive disorder due to known physiological condition without behavioral disturbance: Secondary | ICD-10-CM

## 2023-01-22 LAB — CBC WITH DIFFERENTIAL/PLATELET
Abs Immature Granulocytes: 0.17 10*3/uL — ABNORMAL HIGH (ref 0.00–0.07)
Basophils Absolute: 0.2 10*3/uL — ABNORMAL HIGH (ref 0.0–0.1)
Basophils Relative: 1 %
Eosinophils Absolute: 0.6 10*3/uL — ABNORMAL HIGH (ref 0.0–0.5)
Eosinophils Relative: 4 %
HCT: 28.2 % — ABNORMAL LOW (ref 36.0–46.0)
Hemoglobin: 8.8 g/dL — ABNORMAL LOW (ref 12.0–15.0)
Immature Granulocytes: 1 %
Lymphocytes Relative: 9 %
Lymphs Abs: 1.3 10*3/uL (ref 0.7–4.0)
MCH: 27.7 pg (ref 26.0–34.0)
MCHC: 31.2 g/dL (ref 30.0–36.0)
MCV: 88.7 fL (ref 80.0–100.0)
Monocytes Absolute: 1.1 10*3/uL — ABNORMAL HIGH (ref 0.1–1.0)
Monocytes Relative: 8 %
Neutro Abs: 10.3 10*3/uL — ABNORMAL HIGH (ref 1.7–7.7)
Neutrophils Relative %: 77 %
Platelets: 519 10*3/uL — ABNORMAL HIGH (ref 150–400)
RBC: 3.18 MIL/uL — ABNORMAL LOW (ref 3.87–5.11)
RDW: 15.8 % — ABNORMAL HIGH (ref 11.5–15.5)
WBC: 13.5 10*3/uL — ABNORMAL HIGH (ref 4.0–10.5)
nRBC: 0 % (ref 0.0–0.2)

## 2023-01-22 LAB — URINALYSIS, ROUTINE W REFLEX MICROSCOPIC
Bilirubin Urine: NEGATIVE
Glucose, UA: NEGATIVE mg/dL
Hgb urine dipstick: NEGATIVE
Ketones, ur: NEGATIVE mg/dL
Leukocytes,Ua: NEGATIVE
Nitrite: NEGATIVE
Protein, ur: NEGATIVE mg/dL
Specific Gravity, Urine: 1.013 (ref 1.005–1.030)
pH: 5 (ref 5.0–8.0)

## 2023-01-22 LAB — GLUCOSE, CAPILLARY
Glucose-Capillary: 55 mg/dL — ABNORMAL LOW (ref 70–99)
Glucose-Capillary: 69 mg/dL — ABNORMAL LOW (ref 70–99)
Glucose-Capillary: 67 mg/dL — ABNORMAL LOW (ref 70–99)
Glucose-Capillary: 70 mg/dL (ref 70–99)
Glucose-Capillary: 114 mg/dL — ABNORMAL HIGH (ref 70–99)
Glucose-Capillary: 101 mg/dL — ABNORMAL HIGH (ref 70–99)
Glucose-Capillary: 164 mg/dL — ABNORMAL HIGH (ref 70–99)
Glucose-Capillary: 108 mg/dL — ABNORMAL HIGH (ref 70–99)

## 2023-01-22 LAB — BASIC METABOLIC PANEL
Anion gap: 11 (ref 5–15)
BUN: 44 mg/dL — ABNORMAL HIGH (ref 8–23)
CO2: 22 mmol/L (ref 22–32)
Calcium: 8.9 mg/dL (ref 8.9–10.3)
Chloride: 101 mmol/L (ref 98–111)
Creatinine, Ser: 1.49 mg/dL — ABNORMAL HIGH (ref 0.44–1.00)
GFR, Estimated: 34 mL/min — ABNORMAL LOW (ref >60)
Glucose, Bld: 214 mg/dL — ABNORMAL HIGH (ref 70–99)
Potassium: 4.2 mmol/L (ref 3.5–5.1)
Sodium: 134 mmol/L — ABNORMAL LOW (ref 135–145)

## 2023-01-22 MED ORDER — SODIUM BICARBONATE/SODIUM CHLORIDE MOUTHWASH: OROMUCOSAL | Status: DC | PRN

## 2023-01-22 MED ORDER — TORSEMIDE 20 MG PO TABS
10.0000 mg | ORAL_TABLET | Freq: Every day | ORAL | Status: DC
  Administered 2023-01-23 – 2023-01-28 (×6): 10 mg via ORAL
  Filled 2023-01-22 (×6): qty 1

## 2023-01-22 MED ORDER — CHLORHEXIDINE GLUCONATE 0.12 % MT SOLN
15.0000 mL | Freq: Two times a day (BID) | OROMUCOSAL | Status: DC
  Administered 2023-01-22 – 2023-01-30 (×10): 15 mL via OROMUCOSAL

## 2023-01-22 MED ORDER — METFORMIN HCL 500 MG PO TABS
500.0000 mg | ORAL_TABLET | Freq: Every day | ORAL | Status: DC
  Administered 2023-01-23 – 2023-01-31 (×9): 500 mg via ORAL
  Filled 2023-01-22 (×9): qty 1

## 2023-01-22 NOTE — Patient Care Conference (Cosign Needed)
Inpatient RehabilitationTeam Conference and Plan of Care Update Date: 01/23/2023   Time: 11:46 AM    Patient Name: Cassandra [name]      Medical Record Number: [id number]  Date of Birth: December 29, 1932 Sex: Female         Room/Bed: 4W05C/4W05C-01 Payor Info: Payor: [employer]vertising copy[employer] MEDICARE / Plan: [facility] MEDICARE / Product Type: *No Product type* /    Admit Date/Time:  01/14/2023  4:06 PM  Primary Diagnosis:  Left below-knee amputee [facility])  Hospital Problems: Principal Problem:   Left below-knee amputee [facility])    Expected Discharge Date: Expected Discharge Date: 01/29/23  Team Members Present: Physician leading conference: Dr. Sula Soda Social Worker Present: Lavera Guise, BSW Nurse Present: Chana Bode, RN PT Present: Raechel Chute, PT OT Present: Candee Furbish, OT SLP Present: Other (comment) Fae Pippin, SLP) PPS Coordinator present : Fae Pippin, SLP     Current Status/Progress Goal Weekly Team Focus  Bowel/Bladder   Continent/ incontinent of b&b   Regain continent and decrease incontinent episodes   Assess toileting q shift and as needed    Swallow/Nutrition/ Hydration               ADL's   Supervision UB, Mod/max LB, Max A toileting. Still limited by fatigue, cognition and problem solving   Supervision   ADL retraining, endurance, sit > stands, transfers, balance, family ed    Mobility   bed mobility supervision using bed features, slideboard transfers min A, sit<>stands mod A, WC mobility 181ft supervision - unable to ambulate   supervision  functional mobility/transfers, generalized strengthening and endurance, dynamic standing balanace/coordination, pain management, limb loss education, and D/C planning    Communication                Safety/Cognition/ Behavioral Observations               Pain   c/o of left BKA pain, prn tramadol   Pain < or =3 out 10   Assess pain q shift and as needed    Skin   left BKA   Maintain and  regain good skin integrity  Assess skin q shift and as needed      Discharge Planning:  Discharging home with spouse able to provide 24/7. 1 level home, level entry. 3L of 02.   Team Discussion: Patient post left BKA; limited by fatigue, hallucinations and slow progress.  Patient on target to meet rehab goals: no, currently needs min assist for slide-board transfers and mod assist for squat pivots and min - mod assist for sit - stand. No ambulation at present and anticipate patient will be w/c level at discharge. Hopeful for stand pivot transfers however gait goals were discontinued.  Currently needs mod - max assist for lower body care and toileting.   *See Care Plan and progress notes for long and short-term goals.   Revisions to Treatment Plan:  Medication adjusted; Semglee discontinued, UA; WNL WBC; stable at present [facility] OP treatment   Teaching Needs: Safety, skin care/shrinker use, medications, dietary modifications, transfers, toileting, etc.   Current Barriers to Discharge: Decreased caregiver support, Home enviroment access/layout, and Behavior  Possible Resolutions to Barriers: Family education HH follow up services     Medical Summary Current Status: hallucinations, dental pain, mouth ulcers, difficulty chewing, phantom limb pain  Barriers to Discharge: Medical stability  Barriers to Discharge Comments: hallucinations, dental pain, mouth ulcers, difficulty chewing, phantom limb pain Possible Resolutions to Barriers/Weekly Focus: checked UA, reviewed and negative,  chlorhexidine and normal saline ordered, gabapentin started at night   Continued Need for Acute Rehabilitation Level of Care: The patient requires daily medical management by a physician with specialized training in physical medicine and rehabilitation for the following reasons: Direction of a multidisciplinary physical rehabilitation program to maximize functional independence : Yes Medical management  of patient stability for increased activity during participation in an intensive rehabilitation regime.: Yes Analysis of laboratory values and/or radiology reports with any subsequent need for medication adjustment and/or medical intervention. : Yes   I attest that I was present, lead the team conference, and concur with the assessment and plan of the team.   Chana Bode B 01/23/2023, 8:13 AM

## 2023-01-22 NOTE — Progress Notes (Signed)
Physical Therapy Session Note  Patient Details  Name: Cassandra [name] MRN: [id number] Date of Birth: 12-04-1932  Today's Date: 01/22/2023 PT Individual Time: 0732-0827 PT Individual Time Calculation (min): 55 min   Short Term Goals: Week 1:  PT Short Term Goal 1 (Week 1): pt will transfer supine<>sitting EOB with CGA PT Short Term Goal 2 (Week 1): pt will transfer sit<>stand with LRAD and min A PT Short Term Goal 3 (Week 1): pt will transfer bed<>chair with LRAD and CGA  Skilled Therapeutic Interventions/Progress Updates:   Received pt semi-reclined in bed with 2 RNs present at bedside assessing blood glucose - level currently 70 and encouraged pt to drink/eat. Pt agreeable to PT treatment and reported pain in residual limb was "not too bad" this morning. Session with emphasis on dressing, functional mobility/transfers, generalized strengthening and endurance, and standing balance/coordination. Doffed shrinker with total A to inspect incision (dark scabs forming and RN aware) and pt donned 2XL shrinker with supervision and increased time. Donned pants with supervision and increased time, then requested to change pants due to tightness; therefore provided mod A with donning new pants for time and pt able to bridge multiple times to pull pants over hips with more than adequate time allowed. Donned limb guard with max A and pt transferred semi-reclined<>sitting EOB with HOB slightly elevated using bedrails and increased time with supervision - cues to hook RLE on side of bed for stability. Doffed dirty shirt and donned clean one with supervision. Scooted to EOB with supervision and increased time, then stood from EOB with RW and light mod A (cues for hand placement) and attempted stand<>pivot - pt unable to pivot on foot or boost up enough with UEs to clear R foot, then RLE began trembling. Returned to sitting and performed squat<>pivot bed<>WC to R with light mod A and cues for sequencing/technique  (pt does much better with SB). Pt sat in WC at sink and combed hair with set up assist - pt hallucinating, thinking she was seeing a bug crawling in the sink - treatment team notified. Concluded session with pt sitting in WC, needs within reach, and seatbelt alarm on. Of note, pt required significantly increased time and multiple rest breaks with mobility this morning due to decreased endurance and generalized weakness/deconditioning.   Therapy Documentation Precautions:  Precautions Precautions: Fall Precaution Comments: monitor BP and O2 Restrictions Weight Bearing Restrictions: Yes LLE Weight Bearing: Non weight bearing  Therapy/Group: Individual Therapy [name] PT, DPT 01/22/2023, 6:54 AM

## 2023-01-22 NOTE — Progress Notes (Signed)
Patient ID: Cassandra [name], female   DOB: 1932-09-17, 89 y.o.   MRN: [id number]  Team Conference Report to Patient/Family  Team Conference discussion was reviewed with the patient and caregiver, including goals, any changes in plan of care and target discharge date.  Patient and caregiver express understanding and are in agreement.  The patient has a target discharge date of 01/29/23.  Sw met with pt and spouse at bedside. SW provided team conference updates. Spouse reports he is not too happy with the new that he received with pt potentially will have to d/c at [facility] level and slide board tranfers. Spouse expressed that he prefers pt d/c at ambulating level.  Sw expressed to spouse that we will continue to monitor pt's progress and continue to keep him posted. Spouse has expressed he will not be able to care for pt at current level. No additional questions or concerns currently.  Andria Rhein 01/22/2023, 1:10 PM
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
## 2023-01-22 NOTE — Progress Notes (Signed)
Occupational Therapy Session Note  Patient Details  Name: Cassandra [name] MRN: [id number] Date of Birth: 02-21-1933  Today's Date: 01/22/2023 OT Individual Time: 6962-9528 & 4132-4401 OT Individual Time Calculation (min): 70 min & 72 min   Short Term Goals: Week 2:  OT Short Term Goal 1 (Week 2): STG = LTG due to ELOS  Skilled Therapeutic Interventions/Progress Updates:  Session 1 Skilled OT intervention completed with focus on functional transfers, toileting and simulated toileting tasks. Pt received seated in w/c, expressed fatigue but agreeable to session. Unrated pain reported in mouth; nurse in room to observe that pt has sore on L lower lip and upper gum bed from denture/implant as pt has declined wearing them. Pt pre-medicated. OT offered rest breaks, repositioning and distraction throughout for pain reduction.  Per nursing, need for pt to void to obtain clean catch for UTI check. Transported dependently to [facility] over toilet. Light min A squat pivot > BSC using grab bar with min cues for sequences. Pt unable to motor plan or follow directions for lateral lean, and sit > stand with pt using grab bar to the R side with both hands despite cues for avoiding this due to amputee imbalances on adjacent side. Total A doffing of clothing with min A for balance. Continent of void in urine hat; handed off to nursing for sample. Able to clean peri-area with supervision via posterior lean. Mod A sit > stand using grab bar, then total A donning of brief/pants. Min A squat pivot > w/c with grab bar.  Transported dependently in w/c <> gym for time/energy conservation. Able to place board under R hip with max cues and CGA, then Min A SB transfer due to slight incline with again max cues. Seated EOM, pt then participated in BUE push ups on wooden blocks. Despsite multimodal cueing for R foot to be firmly on floor, pt unable, therefore OT placed dycem on floor for visual cue/increased friction of R foot. Pt  then able to use blocks to fully push up and clear bottom almost to complete stand with CGA for balance x10 with 5 sec hold. Discussed how this needs to be translated to toileting and pushing up for assist with LB clothing due to repeated difficulty with lateral leans independently. Supervision for placement of board under L hip, then CGA SB transfer to w/c with min cues!   Back in room, pt remained seated in w/c, with chair alarm on/activated, ice water provided per request and with all needs in reach at end of session.  Session 2 Skilled OT intervention completed with focus on ADL retraining at shower level. Pt received seated in w/c, agreeable to session. Intermittent pain reported in L residual limb; nurse notified of pain med request. OT offered rest breaks, repositioning education and warm shower for muscle relaxation for pain reduction.  With encouragement, pt agreeable to shower. Transported dependently in w/c > tub bench. Min A squat pivot using grab bar to TTB. Pt with difficulty lateral leaning with attempt to stand without assist, cues needed for safety. Able to stand using grab bar with min A, for total A doffing pants.  L limb left uncovered due to staples and closed incision. Able to shower with CGA for lateral leans for periareas only, otherwise supervision for rest of parts but frequent safety and on task cues needed. Did become mildly SOB during shower, improved with prolonged rest and bathroom ventilation. Education provided about methods for energy conservation with luke warm water at home  to minimize cardiorespiratory fatigue. Min A for series of squat pivots > w/c using grab bar.  Set up A for UB dressing, able to thread brief/pants with supervision however needed up to mod A for sit > stand using RW and total A for donning over hips. Used dycem for R foot placement accuracy and promoting full foot WB, however required multiple trials to stay standing with mod A for posterior bias  and pt unable to correct despite cues. Donned new 2X shrinker on L limb with min A. Combed hair with set up A. Doffed R shin foam bandage and redressed with dry one.  [name] (husband) brought new tennis shoe with baseline custom arch insole that pt stated is used for her "tip toe walking method" therefore OT donned with total A for trial. Pt then able to stand using RW with CGA only!!!! Recommended to pt and primary PT notified for use in future sessions to assist with stands and mobility.  Min A squat pivot > EOB, transitioned to upright in bed with supervision. Pt remained upright in bed, with bed alarm on/activated, and with all needs in reach at end of session.   Therapy Documentation Precautions:  Precautions Precautions: Fall Precaution Comments: monitor BP and O2 Restrictions Weight Bearing Restrictions: Yes LLE Weight Bearing: Non weight bearing    Therapy/Group: Individual Therapy  Melvyn Novas, MS, OTR/L  01/22/2023, 4:06 PM

## 2023-01-22 NOTE — Progress Notes (Signed)
Patient BP was reported by the RNT  97/52. Manual BP by nurse was 100/52. On call provider notified and verbal order given to hold 2200 dose of Metoprolol XL 12.mg

## 2023-01-22 NOTE — Progress Notes (Addendum)
Notified Dr. Carlis Abbott of gum soreness and areas noted to gums. New orders placed.  Therapy reports patient noted with hallucinations. Notified MD. New orders placed.   Tilden Dome, LPN

## 2023-01-22 NOTE — Progress Notes (Signed)
Hypoglycemic Event  CBG: 69 then 59  Treatment: 4 oz juice/soda,  bowl of oatmeal with brown sugar, milk  Symptoms: None  Follow-up CBG: Time:7:30  CBG Result: 70  Possible Reasons for Event: Unknown  Comments/MD notified:yes    Cassandra [name] R
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
## 2023-01-22 NOTE — Progress Notes (Signed)
Physical Therapy Weekly Progress Note  Patient Details  Name: Cassandra [name] MRN: [id number] Date of Birth: 1932/11/21  Beginning of progress report period: Jan 15, 2023 End of progress report period: Jan 22, 2023  Patient has met 2 of 3 short term goals. Pt demonstrates slow progress towards long term goals. Pt is currently able to perform bed mobility with supervision using bed features, sit<>stands with RW and mod A, squat<>pivot transfers with mod A, slideboard transfers with min A, and WC mobility ~178ft using UE and supervision. Pt has been unable to progress to stand<>pivot transfers or ambulate due to weakness in RLE and BUEs, preventing her from pivoting/clearing her R foot. Pt continues to be limited by cognitive impairments, global weakness/deconditioning, intermittent phantom limb pain, and decreased standing balance/coordination. Pt's husband, [name], has been present to observe a few sessions but will require hands on family education prior to discharge.    Patient continues to demonstrate the following deficits muscle weakness and muscle joint tightness, decreased cardiorespiratoy endurance, impaired timing and sequencing, decreased coordination, and decreased motor planning, decreased awareness, decreased problem solving, decreased safety awareness, and decreased memory, and decreased standing balance, decreased postural control, decreased balance strategies, and difficulty maintaining precautions and therefore will continue to benefit from skilled PT intervention to increase functional independence with mobility.  Patient progressing toward long term goals..  Continue plan of care.  PT Short Term Goals Week 1:  PT Short Term Goal 1 (Week 1): pt will transfer supine<>sitting EOB with CGA PT Short Term Goal 1 - Progress (Week 1): Met PT Short Term Goal 2 (Week 1): pt will transfer sit<>stand with LRAD and min A PT Short Term Goal 2 - Progress (Week 1): Met PT Short Term Goal 3  (Week 1): pt will transfer bed<>chair with LRAD and CGA PT Short Term Goal 3 - Progress (Week 1): Progressing toward goal Week 2:  PT Short Term Goal 1 (Week 2): STG=LTG due to LOS  Skilled Therapeutic Interventions/Progress Updates:  Ambulation/gait training;Discharge planning;Functional mobility training;Psychosocial support;Therapeutic Activities;Visual/perceptual remediation/compensation;Balance/vestibular training;Disease management/prevention;Neuromuscular re-education;Skin care/wound management;Therapeutic Exercise;Wheelchair propulsion/positioning;Cognitive remediation/compensation;DME/adaptive equipment instruction;Pain management;Splinting/orthotics;UE/LE Strength taining/ROM;Community reintegration;Patient/family education;UE/LE Runner, broadcasting/film/video   Therapy Documentation Precautions:  Precautions Precautions: Fall Precaution Comments: monitor BP and O2 Restrictions Weight Bearing Restrictions: Yes LLE Weight Bearing: Non weight bearing  Therapy/Group: Individual Therapy [name] PT, DPT 01/22/2023, 7:22 AM

## 2023-01-23 LAB — GLUCOSE, CAPILLARY
Glucose-Capillary: 87 mg/dL (ref 70–99)
Glucose-Capillary: 129 mg/dL — ABNORMAL HIGH (ref 70–99)
Glucose-Capillary: 96 mg/dL (ref 70–99)
Glucose-Capillary: 162 mg/dL — ABNORMAL HIGH (ref 70–99)

## 2023-01-23 LAB — URINE CULTURE

## 2023-01-23 MED ORDER — TRAMADOL HCL 50 MG PO TABS
50.0000 mg | ORAL_TABLET | Freq: Four times a day (QID) | ORAL | Status: DC | PRN
  Administered 2023-01-23 – 2023-01-31 (×13): 50 mg via ORAL
  Filled 2023-01-23 (×14): qty 1

## 2023-01-23 NOTE — Progress Notes (Signed)
Occupational Therapy Session Note  Patient Details  Name: Cassandra [name] MRN: [id number] Date of Birth: 25-Mar-1933  Today's Date: 01/23/2023 OT Individual Time: 1420-1530 OT Individual Time Calculation (min): 70 min    Short Term Goals: Week 2:  OT Short Term Goal 1 (Week 2): STG = LTG due to ELOS  Skilled Therapeutic Interventions/Progress Updates:    Patient agreeable to participate in OT session. Reports increased fatigue from therapy sessions earlier today. Occasional phantom pain experienced during session. Pt agreed to participate in skilled OT session focusing on endurance, cardiovascular strength, and overall strength and activity tolerance.  Pt participated in dance group held in [place]. Completed all activities seated. Visual demonstration, VC, and tactile cues provided for proper form and technique. Pt participated in BUE and BLE A/ROM movements including core activation transitions in order to increase functional performance during BADL tasks with less fatigue demonstrated. Pt took rest breaks when needed due to fatigue.    When returning to room, pt completed functional transfer utilizing SB from chair to bed. SB was set-up and stabilized with pt completing lateral scoot without physical assist. VC provided for safety and sequencing.      Therapy Documentation Precautions:  Precautions Precautions: Fall Precaution Comments: monitor BP and O2 Restrictions Weight Bearing Restrictions: Yes LLE Weight Bearing: Non weight bearing   Therapy/Group: Individual Therapy  Limmie Patricia, OTR/L,CBIS  Supplemental OT - [facility] and [facility] Secure Chat Preferred   01/23/2023, 7:58 AM

## 2023-01-23 NOTE — Progress Notes (Signed)
Physical Therapy Session Note  Patient Details  Name: Cassandra [name] MRN: [id number] Date of Birth: [birth year]  Today's Date: 01/23/2023 PT Individual Time: [id number] and [id number] PT Individual Time Calculation (min): 73 min and 56 min  Short Term Goals: Week 1:  PT Short Term Goal 1 (Week 1): pt will transfer supine<>sitting EOB with CGA PT Short Term Goal 1 - Progress (Week 1): Met PT Short Term Goal 2 (Week 1): pt will transfer sit<>stand with LRAD and min A PT Short Term Goal 2 - Progress (Week 1): Met PT Short Term Goal 3 (Week 1): pt will transfer bed<>chair with LRAD and CGA PT Short Term Goal 3 - Progress (Week 1): Progressing toward goal Week 2:  PT Short Term Goal 1 (Week 2): STG=LTG due to LOS  Skilled Therapeutic Interventions/Progress Updates:   Treatment Session 1 Received pt semi-reclined in bed, pt agreeable to PT treatment, and reported pain 7/10 in L residual limb - RN notified and present to administer pain medication. Session with emphasis on functional mobility/transfers, dressing, toileting/hygiene, generalized strengthening and endurance, and dynamic standing balance/coordination. Pt reported brief was wet - bridged to remove soiled brief and performed pericare in supine with set up assist. Pt reported urge to void, but unable to locate bedpan in time and pt soiled chuck pad. Rolled L/R with supervision and removed soiled chuck pad and placed new one. Donned pants in supine with mod A for time management purposes and limb guard with max A. Pt transferred semi-reclined<>sitting EOB with HOB elevated and use of bedrails with supervision and increased time/effort to scoot to EOB. Doffed dirty shirt and donned clean one with supervision. Donned R shoe with orthotic and stood from EOB with RW and CGA. Pt able to perform x 2 heel raises but R knee very unstable and pt unable to pivot on R foot. Pt transferred bed<>WC via slideboard with CGA and total A to place board. Pt  transported to/from room in [facility] dependently for time management purposes. Attempted to stand in // bars x 4 trials with max A, however pt unable to come into complete stand and straighten arms and RLE - required max cues for anterior weight shifting. Transitioned to sit<>stands from [facility] with RW - pt required x 3 attempts and max A to stand from [facility] with RW due to difficulty anterior weight shfiting and extending R knee. Returned to room and concluded session with pt sitting in WC, needs within reach, and seatbelt alarm on.   Discussion had with pt (granddaughter present) regarding pt's CLOF and current recommendation for slideboard transfers due to safety, improved independence, and energy conservation. Pt verbalized understanding and in agreement. Pt continues to require increased time with all mobility due to global weakness/deconditioning, fatigue, and decreased activity tolerance.   Treatment Session 2 Received pt sitting in WC with Cassandra [name] present to observe session. Pt agreeable to PT treatment and reported pain 7/10 in L residual limb (premedciated) - repositioning and rest breaks done to reduce pain levels, and continued education on desensitization techniques. Session with emphasis on functional mobility/transfers, generalized strengthening and endurance, and dynamic standing balance/coordination. Pt transported to/from room in [facility] dependently for time management purposes. Pt stood form WC with RW and max A with cues for hand placement on RW - performed x3 R heel raises with min/mod A for balance, but still unable to pivot on R foot. Returned to sitting - pt with poor eccentric control, plopping into WC with R knee buckling. Pt then  transferred WC<>mat via slideboard to L with mod A (going uphill) with increased time and multimodal cues for technique, hand placement, and anterior weight shifting. Pt performed 2x15 RLE LAQ and 2x15 RLE hip flexion with 2.5lb ankle weight, followed by hip abduction with red TB  2x20. Pt then performed tricep push ups on yoga blocks 2x5 with emphasis on UE strength. Pt stood from elevated EOM with RW and min A x 5 reps with emphasis on technique, hand placement, and eccentric control when sitting. Pt then performed lateral trunk rotations with 3.3lb medicine ball 2x10 bilaterally with emphasis on core strength. Pt transferred mat<>WC via slideboard downhill with CGA and returned to room. Concluded session with pt sitting in [facility] with all needs within reach, awaiting upcoming OT session. Cassandra [name] present at bedside.   Therapy Documentation Precautions:  Precautions Precautions: Fall Precaution Comments: monitor BP and O2 Restrictions Weight Bearing Restrictions: Yes LLE Weight Bearing: Non weight bearing  Therapy/Group: Individual Therapy [name] PT, DPT 01/23/2023, 7:00 AM
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
## 2023-01-23 NOTE — Progress Notes (Signed)
Patient is irritable and agitated. Patient asked me to open her lids, milk, creamer, and syrup on her breakfast tray. Patient then stated don't touch my tray. Explained to patient that only open what she requested and never touched her food, Dr. Benjie Karvonen in the hallway to witness patients behavior. Also applied a restriced arm band on patients left arm due to mastectomy. Educated patient that is a precaution and needs to be applied. Patient upset and yelling at writer because she did not want the pink restricted arm band on.

## 2023-01-23 NOTE — Progress Notes (Signed)
Orthopaedic Trauma Progress Note  SUBJECTIVE: Patient doing okay today, about to eat lunch.  Had some issues with hypoglycemia, particularly in the mornings.  Has also developed some blisters in her mouth, it making it difficult for her to wear her dentures and to eat.  Unsure the cause of the oral blisters. See sutures from the left lower extremity been removed, incision is stable.  Pain tends to ebb and flow, having some issues with phantom limb pain.  OBJECTIVE:  Vitals:   01/23/23 0539 01/23/23 0600  BP: 128/63   Pulse: 64   Resp: 17   Temp: 97.8 F (36.6 C)   SpO2: 93% 93%   General: Sitting up in wheelchair, no acute distress Respiratory: No increased work of breathing at rest Left lower extremity: Sutures have been removed.  Incision stable there is some darkening of the skin edges laterally but no signs of infection.  No signs of wound dehiscence.  No significant tenderness with palpation about the stump.  No tenderness throughout the thigh or knee.  ASSESSMENT: Cassandra [name] is a 89 y.o. female s/p LEFT AMPUTATION BELOW KNEE by Dr. Jena Gauss on 01/03/2023  CV/Blood loss: Hemoglobin 8.8 on 01/22/2023.  Stable  PLAN: Weightbearing: NWB LLE ROM: Okay for hip and knee range of motion as tolerated Incisional and dressing care: Okay to leave incision open to air.   Showering: Okay to allow incision to get wet when showering Orthopedic device(s): Stump shrinker LLE Pain management: Tylenol, Neurontin, Robaxin, tramadol VTE prophylaxis:  Eliquis and Plavix , SCDs Impediments to Fracture Healing: Vitamin D level 39, continue on low dose supplementation Dispo: Continue care and therapies per CIR.  Follow - up plan: 2 weeks after d/c from CIR for wound check and referral to [facility] for prosthesis.   Contact information:  Truitt Merle MD, Thyra Breed PA-C. After hours and holidays please check [email or web address] for group call information for [facility]

## 2023-01-24 DIAGNOSIS — I10 Essential (primary) hypertension: Secondary | ICD-10-CM

## 2023-01-24 DIAGNOSIS — R52 Pain, unspecified: Secondary | ICD-10-CM

## 2023-01-24 DIAGNOSIS — E1142 Type 2 diabetes mellitus with diabetic polyneuropathy: Secondary | ICD-10-CM

## 2023-01-24 DIAGNOSIS — D62 Acute posthemorrhagic anemia: Secondary | ICD-10-CM

## 2023-01-24 DIAGNOSIS — G548 Other nerve root and plexus disorders: Secondary | ICD-10-CM

## 2023-01-24 DIAGNOSIS — E871 Hypo-osmolality and hyponatremia: Secondary | ICD-10-CM

## 2023-01-24 DIAGNOSIS — Z794 Long term (current) use of insulin: Secondary | ICD-10-CM

## 2023-01-24 LAB — GLUCOSE, CAPILLARY
Glucose-Capillary: 114 mg/dL — ABNORMAL HIGH (ref 70–99)
Glucose-Capillary: 131 mg/dL — ABNORMAL HIGH (ref 70–99)
Glucose-Capillary: 115 mg/dL — ABNORMAL HIGH (ref 70–99)
Glucose-Capillary: 102 mg/dL — ABNORMAL HIGH (ref 70–99)

## 2023-01-24 MED ORDER — PREGABALIN 25 MG PO CAPS
25.0000 mg | ORAL_CAPSULE | Freq: Every day | ORAL | Status: DC
  Administered 2023-01-24 – 2023-01-30 (×7): 25 mg via ORAL
  Filled 2023-01-24 (×7): qty 1

## 2023-01-24 MED ORDER — PREGABALIN 25 MG PO CAPS: 25.0000 mg | ORAL_CAPSULE | Freq: Every day | ORAL | Status: DC

## 2023-01-24 MED ORDER — GABAPENTIN 100 MG PO CAPS: 100.0000 mg | ORAL_CAPSULE | Freq: Two times a day (BID) | ORAL | Status: DC

## 2023-01-24 NOTE — Progress Notes (Signed)
Physical Therapy Session Note  Patient Details  Name: Cassandra [name] MRN: [id number] Date of Birth: Jan 27, 1933  Today's Date: 01/24/2023 PT Individual Time: 0815-0926 PT Individual Time Calculation (min): 71 min   Short Term Goals: Week 1:  PT Short Term Goal 1 (Week 1): pt will transfer supine<>sitting EOB with CGA PT Short Term Goal 1 - Progress (Week 1): Met PT Short Term Goal 2 (Week 1): pt will transfer sit<>stand with LRAD and min A PT Short Term Goal 2 - Progress (Week 1): Met PT Short Term Goal 3 (Week 1): pt will transfer bed<>chair with LRAD and CGA PT Short Term Goal 3 - Progress (Week 1): Progressing toward goal Week 2:  PT Short Term Goal 1 (Week 2): STG=LTG due to LOS  Skilled Therapeutic Interventions/Progress Updates:   Received pt semi-reclined in bed finishing breakfast. Pt agreeable to PT treatment and reported intermittent phantom pain spasms - RN notified of pt's request for pain medicine at end of session. Session with emphasis on functional mobility/transfers, generalized strengthening and endurance, dynamic standing balance/coordination, and simulated car transfers. Donned R shoe with orthotic and limb guard with max A and pt transferred semi-reclined<>sitting EOB with HOB elevated and use of bedrails with supervision with significantly increased time and effort to scoot to EOB (~5 minutes). Pt required numerous rest breaks transitioning to sitting EOB due to fatigue/SOB - pulse ox not giving accurate reading. Pt performed SB transfer bed<>WC to R (level surface) with CGA via multiple scoots, increased time, and total A to place board. Sat in WC and drank coffee while MD present for morning rounds. Pt rinsed with oral mouth wash and combed hair at sink with set up assist and increased time while placing lunch order, then transported to/from room in [facility] dependently for time management purposes. Pt performed simulated car transfer via SB with CGA and total A to place board.  Pt required max step by step cues for sequencing/technique and demonstrated poor recall of cues for hand placement, head/hips relationship, and anterior weight shifting ultimately opting to reach out and grab onto whatever she can reach to pull herself with. Pt then performed BUE strengthening on UBE at level 2 alternating 1 minute forwards and 1 minute backwards for 4 minutes with emphasis on UE strength/endurance - pt moving very slow throughout. Worked on standing technique and performed x 3 sit<>stands from [facility] with RW and max fading to mod A with step by step cues to scoot to edge of chair, for hand placement on RW and WC armrest, anterior weight shifting, and R knee extension. Pt with poor eccentric control when sitting, with R knee buckling once - max cues to reach back for armrest prior to sitting. Returned to room and reported urge to use restroom - NT notified due to time restrictions and pt left sitting in [facility] with all needs within reach.  Therapy Documentation Precautions:  Precautions Precautions: Fall Precaution Comments: monitor BP and O2 Restrictions Weight Bearing Restrictions: Yes LLE Weight Bearing: Non weight bearing  Therapy/Group: Individual Therapy Cassandra [name] Cassandra [name] PT, DPT 01/24/2023, 6:30 AM

## 2023-01-24 NOTE — Progress Notes (Signed)
Patient ID: Cassandra [name], female   DOB: 12-19-1932, 89 y.o.   MRN: [id number]  Transfer board and amputee pad ordered through [employer].  HH orders sent to [facility].
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
## 2023-01-24 NOTE — Progress Notes (Signed)
Occupational Therapy Session Note  Patient Details  Name: Cassandra [name] MRN: [id number] Date of Birth: [birth year]  Today's Date: 01/24/2023 OT Individual Time: 1002-1057 & 1300-1410 OT Individual Time Calculation (min): 55 min & 70 min   Short Term Goals: Week 2:  OT Short Term Goal 1 (Week 2): STG = LTG due to ELOS  Skilled Therapeutic Interventions/Progress Updates:  Session 1 Skilled OT intervention completed with focus on functional transfers, simulated toileting tasks. Pt received supine in bed, agreeable to session. Intermittent phantom pain reported in L residual limb; pre-medicated. OT offered rest breaks, repositioning and distraction throughout for pain reduction.  With light encouragement, pt agreeable to get OOB. Transitioned to EOB with CGA. Able to place board under R hip with min A, then supervision SB transfer to w/c with stabilization of w/c provided only. Seated at sink pt completed hand hygiene with set up A.  Dependently transported <> gym for energy conservation. CGA for placement of board under R hip, then CGA with extended time for SB transfer to EOM as board with incline. Mod cues needed for sequencing and off loading of limb guard needed. Discussed how this relates to transferring at w/c level to uneven surfaces at home.   Seated EOM, pt completed the following tasks to simulate toileting at a seated level for increased independence with toileting needs at DC: -utilized lateral leans with supervision/CGA to retrieve silicone dots under all areas of her bottom. Min difficulty noted with retrieving due to friction on mat however OT educated on this relation to friction with donning pants on BSC. Cues needed to stay within a realistic frame to simulate commode vs lying down, however improved lifting of both hips for ease of retrieving. -Donning/doffing yellow band from thighs <> chest for further practice with lateral leans and multitasking waist band of LB  clothing  CGA placement of board under L hip and SB transfer to w/c, though mod cues still needed for hand placement/safety. Back in room pt remained seated in w/c, with chair alarm on/activated, and with all needs in reach at end of session.  Session 2 Skilled OT intervention completed with focus on family education regarding SB and squat pivot transfers <> TTB and DC planning. Pt received seated in w/c, agreeable to session. Un-rated pain reported in L residual limb; nurse made aware of med request but not due; did issue at end of session. OT offered rest breaks, repositioning and distraction throughout for pain reduction.  [name] with expressed concern about current DC date inquiring extension. Education provided about pt's CLOF, slower than anticipated progress and maximum safety with modified transfers vs attempting to pivot in standing at this time. Offered for [name] to attend PM therapy sessions for lots of hands on practice and [name] participated in hands on during current session.  Transported dependently in w/c <> ADL bathroom for energy conservation. [name] reports pt having TTB for tub shower, however determined that due to space limitations pt will need to enter forward into bathroom, to transfer > L side onto bench. OT demonstrated with pt, SB transfer on slight incline with CGA/min A to TTB with mod cues, supervision pivot on bench into shower, then CGA squat pivot from bench > w/c towards R side with mod cues. Education provided on dycem for prevention of board movement, then [employer] CGA SB transfer with pt to bench and CGA squat pivot back to w/c. Discussed safety strategies, body mechanics as the helper, and considerations to be  mindful of. Discussed shower curtain tuck method to prevent water spills.   Back in room, pt completed CGA squat pivot to EOB, then supervision bed mobility to upright in bed. Asked [name] to take pictures of bathroom and current BSC to determine if  needed DME by DC.  Pt remained semi-upright in bed, with bed alarm on/activated, and with all needs in reach at end of session.   Therapy Documentation Precautions:  Precautions Precautions: Fall Precaution Comments: monitor BP and O2 Restrictions Weight Bearing Restrictions: Yes LLE Weight Bearing: Non weight bearing    Therapy/Group: Individual Therapy  Thanh Mottern E Jaben Benegas, MS, OTR/L  01/24/2023, 3:00 PM

## 2023-01-25 LAB — GLUCOSE, CAPILLARY
Glucose-Capillary: 109 mg/dL — ABNORMAL HIGH (ref 70–99)
Glucose-Capillary: 160 mg/dL — ABNORMAL HIGH (ref 70–99)

## 2023-01-25 MED ORDER — LIDOCAINE VISCOUS HCL 2 % MT SOLN: 15.0000 mL | Freq: Four times a day (QID) | OROMUCOSAL | Status: DC | PRN

## 2023-01-25 NOTE — Progress Notes (Signed)
Occupational Therapy Session Note  Patient Details  Name: Cassandra [name] MRN: [id number] Date of Birth: 1932-09-09  Today's Date: 01/25/2023 OT Individual Time: 1130-1207 OT Individual Time Calculation (min): 37 min  and Today's Date: 01/25/2023 OT Missed Time: 30 Minutes Missed Time Reason: Other (comment) (Therapist running behind with other patient.)   Short Term Goals: Week 2:  OT Short Term Goal 1 (Week 2): STG = LTG due to ELOS  Skilled Therapeutic Interventions/Progress Updates:  Pt received resting in bed for skilled OT session with focus on oral care and UE strengthening at bed-level. Pt agreeable to interventions, demonstrating overall pleasant mood. Pt with un-rated pain in mouth. OT offering intermediate rest breaks and positioning suggestions throughout session to address pain/fatigue and maximize participation/safety in session.   Pt initially declining intervention, requesting to rest, but with encouragement patient receptive of bed-level UE strengthening. Pt willing to participate in series of exercises, including: -Bicep curls -Overhead press -Punches  Pt completes 10-12 reps of each exercise with 2# DB, requiring multimodal cuing for correct form.   Pt verbalizes oral discomfort due to sore created by her dentures. Pt completes oral care with setup of needed items.   Pt re-educated on the importance residual limb positioning while in bed, knee extended vs propped on a pillow. Pt receptive of education, sharing she exercises her legs while in bed earlier in the morning. Pt demonstrating hip flexion and abduction.   Pt remained resting in bed with all immediate needs met at end of session. Pt continues to be appropriate for skilled OT intervention to promote further functional independence.   Therapy Documentation Precautions:  Precautions Precautions: Fall Precaution Comments: monitor BP and O2 Restrictions Weight Bearing Restrictions: Yes LLE Weight Bearing:  Non weight bearing   Therapy/Group: Individual Therapy  Lou Cal, OTR/L, MSOT  01/25/2023, 6:59 AM
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
## 2023-01-26 LAB — GLUCOSE, CAPILLARY
Glucose-Capillary: 123 mg/dL — ABNORMAL HIGH (ref 70–99)
Glucose-Capillary: 108 mg/dL — ABNORMAL HIGH (ref 70–99)
Glucose-Capillary: 113 mg/dL — ABNORMAL HIGH (ref 70–99)
Glucose-Capillary: 178 mg/dL — ABNORMAL HIGH (ref 70–99)

## 2023-01-26 NOTE — Progress Notes (Signed)
Physical Therapy Session Note  Patient Details  Name: Cassandra [name] MRN: [id number] Date of Birth: [birth year]  Today's Date: 01/26/2023 PT Individual Time: 0900-1002 PT Individual Time Calculation (min): 62 min   Short Term Goals: Week 2:  PT Short Term Goal 1 (Week 2): STG=LTG due to LOS  Skilled Therapeutic Interventions/Progress Updates:      Pt supine in bed upon arrival with pillow under L knee  Pt agreeable to therapy. Pt reporting 8/10 pain in posterior aspect of L LE.    Pt performed supine to sit with HOB elevated and CGA. Pt reporting dizziness, BP 159/66 HR 75. Pt asymptomatic after sitting EOB for <5 min. Pt performed scooting to EOB with mod A, verbal cues provided for lateral trunk lean. Pt performed slidebaord transfer to R with CGA, with total A for placing and removing sldieboard, max cuing provided for technique.    Pt transported dependently in WC as pt refused to propel WC 2/2 B UE pain.   Pt performed sit<>stand x1 from [facility] with RW  and mod A, verbal cues provided for technique, with empahsis on scooting forward, and glute activation for standing upright. Pt attempted sit <>stand x2 from [facility] with RW and max A, with verbal cues provided for scooting forward, and forward trunk lean and UE positioning, pt unable to get fully erect and reports FOF, faitgue, and pain.   Utilized seated rest break to provide pt education for positioning in [facility] and bed to reduce knee flexion/hip flexion contracture. Handout provided for bed/ wheelchair posiitoning, education provided  to keep knee extended, not resting on pillow in bed or in dependent positioning in WC. Pt verbalized understanding.   Pt demos tendency to rest L LE with hip flexed and knee flexed. PT assess pt PROM, and pt able to achieve full knee extension and neutral hip. Pt performed the following therex for B LE strengthening, and contracture prevention: max cuing and verbal/tactile cuing/demonstration provided  throughout to ensure slow/controlled motion, and motion within full range, to reduce compensation.   1x30 L LE quad sets  1x20 B LE LAQ  1x20 alternating marching 1x20 long sitting L LE hip abduction 1x30 glute sets  Pt requesting to get back in bed as pt is sleepy and did not sleep well last night. Pt performed slideboard transfer with CGA and max cuing for technique with emphasis on head/hip ratio and UE positioning.   Pt supine in bed with all needs within reach at end of session.       Therapy Documentation Precautions:  Precautions Precautions: Fall Precaution Comments: monitor BP and O2 Restrictions Weight Bearing Restrictions: Yes LLE Weight Bearing: Non weight bearing  Therapy/Group: Individual Therapy  [facility] [place], [place], DPT  01/26/2023, 7:31 AM

## 2023-01-26 NOTE — Progress Notes (Signed)
Physical Therapy Session Note  Patient Details  Name: Cassandra [name] MRN: [id number] Date of Birth: [birth year]  Today's Date: 01/26/2023 PT Individual Time: 1300-1308 PT Individual Time Calculation (min): 8 min  and Today's Date: 01/26/2023 PT Missed Time: 37 Minutes Missed Time Reason: Patient fatigue;Pain;Patient unwilling to participate (pt refused)  Short Term Goals: Week 1:  PT Short Term Goal 1 (Week 1): pt will transfer supine<>sitting EOB with CGA PT Short Term Goal 1 - Progress (Week 1): Met PT Short Term Goal 2 (Week 1): pt will transfer sit<>stand with LRAD and min A PT Short Term Goal 2 - Progress (Week 1): Met PT Short Term Goal 3 (Week 1): pt will transfer bed<>chair with LRAD and CGA PT Short Term Goal 3 - Progress (Week 1): Progressing toward goal Week 2:  PT Short Term Goal 1 (Week 2): STG=LTG due to LOS  Skilled Therapeutic Interventions/Progress Updates:      Pt supine in bed upon arrival with husband in room. Pt reporting 10/10 L LE pain, premedicated. Pt refusing therapy.   Therapist offered up exercises in bed only. Pt continuing to refuse.   Therapist followed up on pt positioning in bed from previous session as pt knee in significant flexion. Therapist adjusted bed controls on bed to improve knee extension. Education provided on how pt can adjust bed controls to improve positioning. Therapist ensure husband was also aware of bed and wheelchair positioning handout for contracture prevention.   Pt supine in bed with all needs within reach and bed alarm on at end of session.   Therapy Documentation Precautions:  Precautions Precautions: Fall Precaution Comments: monitor BP and O2 Restrictions Weight Bearing Restrictions: Yes LLE Weight Bearing: Non weight bearing    Therapy/Group: Individual Therapy  [facility] [place], [place], DPT  01/26/2023, 1:21 PM

## 2023-01-27 LAB — CBC
HCT: 26.8 % — ABNORMAL LOW (ref 36.0–46.0)
Hemoglobin: 8.3 g/dL — ABNORMAL LOW (ref 12.0–15.0)
MCH: 26.9 pg (ref 26.0–34.0)
MCHC: 31 g/dL (ref 30.0–36.0)
MCV: 87 fL (ref 80.0–100.0)
Platelets: 419 10*3/uL — ABNORMAL HIGH (ref 150–400)
RBC: 3.08 MIL/uL — ABNORMAL LOW (ref 3.87–5.11)
RDW: 15.8 % — ABNORMAL HIGH (ref 11.5–15.5)
WBC: 10.3 10*3/uL (ref 4.0–10.5)
nRBC: 0 % (ref 0.0–0.2)

## 2023-01-27 LAB — BASIC METABOLIC PANEL
Anion gap: 7 (ref 5–15)
BUN: 20 mg/dL (ref 8–23)
CO2: 25 mmol/L (ref 22–32)
Calcium: 8.6 mg/dL — ABNORMAL LOW (ref 8.9–10.3)
Chloride: 105 mmol/L (ref 98–111)
Creatinine, Ser: 1.12 mg/dL — ABNORMAL HIGH (ref 0.44–1.00)
GFR, Estimated: 47 mL/min — ABNORMAL LOW (ref >60)
Glucose, Bld: 109 mg/dL — ABNORMAL HIGH (ref 70–99)
Potassium: 4.7 mmol/L (ref 3.5–5.1)
Sodium: 137 mmol/L (ref 135–145)

## 2023-01-27 LAB — GLUCOSE, CAPILLARY
Glucose-Capillary: 102 mg/dL — ABNORMAL HIGH (ref 70–99)
Glucose-Capillary: 107 mg/dL — ABNORMAL HIGH (ref 70–99)
Glucose-Capillary: 130 mg/dL — ABNORMAL HIGH (ref 70–99)
Glucose-Capillary: 146 mg/dL — ABNORMAL HIGH (ref 70–99)

## 2023-01-27 MED ORDER — ACETAMINOPHEN 325 MG PO TABS: 325.0000 mg | ORAL_TABLET | Freq: Four times a day (QID) | ORAL | Status: AC | PRN

## 2023-01-27 NOTE — Plan of Care (Signed)
  Problem: RH Ambulation Goal: LTG Patient will ambulate in controlled environment (PT) Description: LTG: Patient will ambulate in a controlled environment, # of feet with assistance (PT). Outcome: Not Applicable Flowsheets (Taken 01/27/2023 0659) LTG: Pt will ambulate in controlled environ  assist needed:: (D/C) -- Note: D/C Goal: LTG Patient will ambulate in home environment (PT) Description: LTG: Patient will ambulate in home environment, # of feet with assistance (PT). Outcome: Not Applicable Flowsheets (Taken 01/27/2023 0659) LTG: Pt will ambulate in home environ  assist needed:: (D/C) -- Note: D/C

## 2023-01-27 NOTE — Progress Notes (Signed)
Patient having some concerns in regards to when she is being discharged. Concerns related to changes with lifestyle and adjustment outside of the hospital. Had questions about having a caregiver. Is unaware if would be covered by insurance and patient expressed plans to possibly pay out of pocket for service.
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
## 2023-01-27 NOTE — Progress Notes (Signed)
Physical Therapy Session Note  Patient Details  Name: Cassandra [name] MRN: [id number] Date of Birth: December 05, 1932  Today's Date: 01/27/2023 PT Individual Time: 0830-0942 PT Individual Time Calculation (min): 72 min   Short Term Goals: Week 1:  PT Short Term Goal 1 (Week 1): pt will transfer supine<>sitting EOB with CGA PT Short Term Goal 1 - Progress (Week 1): Met PT Short Term Goal 2 (Week 1): pt will transfer sit<>stand with LRAD and min A PT Short Term Goal 2 - Progress (Week 1): Met PT Short Term Goal 3 (Week 1): pt will transfer bed<>chair with LRAD and CGA PT Short Term Goal 3 - Progress (Week 1): Progressing toward goal Week 2:  PT Short Term Goal 1 (Week 2): STG=LTG due to LOS  Skilled Therapeutic Interventions/Progress Updates:  Received pt semi-reclined in bed, pt agreeable to PT treatment, and denied any pain during session. Session with emphasis on dressing, functional mobility/transfers, generalized strengthening and endurance, and dynamic standing balance/coordination. Pt reported brief was soiled - bridged to remove soiled brief and performed pericare with set up assist. Rolled L/R with supervision/mod I using bedrails and donned clean brief with max A. Donned pants in supine with max A via rolling for time management purposes. Donned R shoe with orthotic and L limb guard with max A. Pt transferred semi-reclined<>sitting EOB with HOB slightly elevated and use of bedrails with supervision and increased time/effort to scoot to EOB - discussed having [name] purchase bed assist rail. Doffed dirty shirt and donned clean one with supervision. Pt transferred bed<>WC via slideboard with CGA/close supervision and total A to place board. Pt demonstrates poor carry over with cues for technique and prefers to do it her own way - via multiple small scoots. Pt expressed being concerned that she "will never walk again" - discussed that realistically pt is not currently at a level where she is able  to safely able to "hop" on 1 leg right now but that she will continue to get PT services and her prosthetic upon discharge to progress with ambulation.   MD arrived for morning rounds and pt sat in [facility] at sink and attempted to put in dentures - still unable due to blisters along gums. Pt performed WC mobility 110ft using BUE and supervision with increased time to dayroom - emphasis on UE strength. Attempted to have pt set up transfer to mat - pt required mod A and max cues for transfer set up and transferred onto mat via slideboard and close supervision/CGA with assist to place and remove board. Pt stood from EOB with BUE support on RW and min A x 2 trials and performed standing R heel raises 2x5 with mod A due to R knee instability. Pt stood x 2 additional trials from EOM with RW and mod A (due to fatigue) and worked on dynamic standing balance tossing horseshoes x 2 trials with RUE and with LUE. Pt leaning heavily on mat for support and began sinking down with increased fatigue - requiring mod A for balance. Pt with one large instance of buckling of R knee, requiring total A for controlled descent onto mat.   Pt reported urge to toilet and transferred mat<>WC via slideboard with CGA and total A to place board. Returned to room and transferred to toilet with bedside commode over top via slideboard and mod A due to urgency - max cues for technique. Pt required max A for clothing management via lateral leans and boosted with arms for therapist to  remove brief/pants dependently. Pt continent of bowel and bladder and performed hygiene management seated with supervision. Donned clean brief with max A and pulled brief/pants over hips with total A with pt boosting up numerous times with UEs. Pt declined using SB returning to [facility] and transferred bedside commode<>WC via squat<>pivot to R with mod A using grab bars. Pt sat in WC at sink and washed hands, then family friend arrived. Concluded session with pt sitting in WC,  needs within reach, and seatbelt alarm on.   Therapy Documentation Precautions:  Precautions Precautions: Fall Precaution Comments: monitor BP and O2 Restrictions Weight Bearing Restrictions: Yes LLE Weight Bearing: Non weight bearing  Therapy/Group: Individual Therapy [name] PT, DPT 01/27/2023, 6:56 AM
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
## 2023-01-27 NOTE — Progress Notes (Signed)
Occupational Therapy Note  Patient Details  Name: Cassandra [name] MRN: [id number] Date of Birth: [birth year]  The following equipment is recommended by occupational therapy to ensure safe toilet transfers with a slide board, as well as to increase pt independence and decrease burden of care at discharge:  -Wide drop arm BSC   Melvyn Novas, MS, OTR/L  01/27/2023, 4:00 PM

## 2023-01-27 NOTE — Progress Notes (Signed)
Occupational Therapy Session Note  Patient Details  Name: Cassandra [name] MRN: [id number] Date of Birth: 12-13-1932  Today's Date: 01/27/2023 OT Individual Time: 1020-1115 & 1420-1534 OT Individual Time Calculation (min): 55 min & 74 min   Short Term Goals: Week 2:  OT Short Term Goal 1 (Week 2): STG = LTG due to ELOS  Skilled Therapeutic Interventions/Progress Updates:  Session 1 Skilled OT intervention completed with focus on DC planning/education, functional transfers, BUE endurance. Pt received seated in w/c with friend present, agreeable to session. 7/10 pain reported in L residual limb; nurse notified of med request. OT offered rest breaks, repositioning and distraction throughout for pain reduction.  Pt with extensive questions about DC plans/updates. Informed OT that she will have caregiving assist by friend/s (who have training/experience with healthcare) from 6-9 every day along with HHPT in home to restart services that pt was receiving PTA. Discussed pt's CLOF, about non-ambulatory at DC, with only attempting stands with a therapist due to poor safety and efficiency with these at times. Pt with poor awareness as despite education, verbalized her plan to walk outside for sunshine. Friend present who is a neighbor verbalized that she will make note of education info and reinforce with pt if present however Cassandra [name] (husband) planning to be present in PM session. Education provided on contracture prevention including residual limb positioning and adaptations that can be made to w/c or when in bed.  Transported pt dependently in w/c <> gym for energy conservation. Assessed pt's sequencing/safety with prepping for SB transfer; pt attempting to place SB over top of arm rest vs lifting the arm rest prior. Max cues for sequencing needed. Min A board placement under R hip then supervision transfer to EOM however board stability provided but much less than prior sessions.  Seated EOM pt  completed x3 BUE push ups on wooden blocks with supervision for balance and cues needed to avoid standing completely upright as OT was providing stability to blocks. Pt again required mod cues for sequencing a squat pivot > w/c. Able to transfer with CGA.   Back in room, again mod cues for prepping for squat pivot to EOB. Unable to teach back proper steps for task; may benefit from visual handout for increased recall. Pt attempted to stand by pulling on bed rail for lifting and needed min A to shift hips for safety. Transitioned to upright in bed with supervision. Pt remained upright in bed, with bed alarm on/activated, and with all needs in reach at end of session.  Session 2 Skilled OT intervention completed with focus on family education with husband Cassandra [name] present regarding toilet transfers and toilet education. Pt received upright in bed, agreeable to session. No pain reported.  Education/demonstration provided on the following throughout session in prep for upcoming DC: -Discussed needed DME including wide drop arm BSC as pt's current one does not have arm rests that lifts up, as well as wider surface area needed for safety of the board during transfer; handout issued in case insurance does not assist with this need -Reviewed over toilet method or at bedside -Discussed how to elevate arm rest on w/c in prep for w/c level transfer as pt's current one requires Cassandra [name] to remove it not just lift one side like present one -Demonstrated supervision level bed mobility to EOB, then CGA slideboard transfer with dycem on commode for stability from EOB > DABBSC with mod cues. Demo of pt pushing up with BUE on arm rests with CGA for OT  to doff LB clothing down due to difficulty laterally leaning and poor progress with this in therapy sessions -(Pt continent of void/BM; charted) Discussed ways to provide modesty/privacy however advised that pt have Cassandra [name] around for all toileting steps due to poor awareness and  safety, allowing distance for void/BM, but then present for supervised wiping and then min A donning pants over hips with pt in push up position -On 2nd trial, Cassandra [name] assisted with doffing/donning pants with pt in push up position for hands on practice. Cues provided for safety with use of gait belt, body mechanics as a caregiver and cues needed to give to pt -Demo of how to lower arm rest independently however pt with poor ability due to weak grasp/fine motor. -Demon of min A squat pivot > EOB, and supervision bed mobility to upright in bed  Discussed with pt and husband about potential extension for hands on training as both therapists feel Cassandra [name] would benefit from more education. [name] in agreement and in favor, however pt became tearful with this conversation stating "I don't know how long I have left to live, I need to go home and spend time with my daughter." Will share with team.  Pt remained upright in bed, with bed alarm on/activated, and with all needs in reach at end of session.    Therapy Documentation Precautions:  Precautions Precautions: Fall Precaution Comments: monitor BP and O2 Restrictions Weight Bearing Restrictions: Yes LLE Weight Bearing: Non weight bearing    Therapy/Group: Individual Therapy  Melvyn Novas, MS, OTR/L  01/27/2023, 3:59 PM

## 2023-01-28 LAB — GLUCOSE, CAPILLARY
Glucose-Capillary: 106 mg/dL — ABNORMAL HIGH (ref 70–99)
Glucose-Capillary: 133 mg/dL — ABNORMAL HIGH (ref 70–99)
Glucose-Capillary: 86 mg/dL (ref 70–99)
Glucose-Capillary: 169 mg/dL — ABNORMAL HIGH (ref 70–99)

## 2023-01-28 MED ORDER — CLOPIDOGREL BISULFATE 75 MG PO TABS: 75.0000 mg | ORAL_TABLET | Freq: Every day | ORAL | 0 refills | Status: DC

## 2023-01-28 MED ORDER — TORSEMIDE 10 MG PO TABS: 10.0000 mg | ORAL_TABLET | Freq: Every day | ORAL | 0 refills | Status: DC

## 2023-01-28 MED ORDER — APIXABAN 2.5 MG PO TABS: 2.5000 mg | ORAL_TABLET | Freq: Two times a day (BID) | ORAL | 0 refills | Status: DC

## 2023-01-28 MED ORDER — METFORMIN HCL 500 MG PO TABS: 500.0000 mg | ORAL_TABLET | Freq: Two times a day (BID) | ORAL | 0 refills | Status: DC

## 2023-01-28 MED ORDER — TRAMADOL HCL 50 MG PO TABS: 50.0000 mg | ORAL_TABLET | Freq: Four times a day (QID) | ORAL | 0 refills | Status: DC | PRN

## 2023-01-28 MED ORDER — EYE WASH OP SOLN: 1.0000 [drp] | OPHTHALMIC | Status: DC | PRN

## 2023-01-28 MED ORDER — DOCUSATE SODIUM 100 MG PO CAPS: 100.0000 mg | ORAL_CAPSULE | Freq: Two times a day (BID) | ORAL | 0 refills | Status: AC

## 2023-01-28 MED ORDER — TORSEMIDE 20 MG PO TABS
10.0000 mg | ORAL_TABLET | Freq: Once | ORAL | Status: AC
  Administered 2023-01-28: 10 mg via ORAL
  Filled 2023-01-28: qty 1

## 2023-01-28 MED ORDER — METHOCARBAMOL 500 MG PO TABS: 500.0000 mg | ORAL_TABLET | Freq: Four times a day (QID) | ORAL | 0 refills | Status: DC | PRN

## 2023-01-28 MED ORDER — TORSEMIDE 20 MG PO TABS
20.0000 mg | ORAL_TABLET | Freq: Every day | ORAL | Status: DC
  Administered 2023-01-29 – 2023-01-31 (×3): 20 mg via ORAL
  Filled 2023-01-28 (×3): qty 1

## 2023-01-28 MED ORDER — PREGABALIN 25 MG PO CAPS: 25.0000 mg | ORAL_CAPSULE | Freq: Every day | ORAL | 0 refills | Status: DC
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
## 2023-01-28 NOTE — Progress Notes (Addendum)
Patient ID: Cassandra [name], female   DOB: 11-07-1932, 89 y.o.   MRN: [id number]  Bari drop arm commode ordered through [employer] . HH orders sent to [facility] for d/c Friday.

## 2023-01-28 NOTE — Progress Notes (Signed)
Occupational Therapy Session Note  Patient Details  Name: Cassandra [name] MRN: [id number] Date of Birth: 1932/10/11  Today's Date: 01/28/2023 OT Individual Time: 0905-1000 & 1335-1417 OT Individual Time Calculation (min): 55 min & 42 min   Short Term Goals: Week 2:  OT Short Term Goal 1 (Week 2): STG = LTG due to ELOS  Skilled Therapeutic Interventions/Progress Updates:  Session 1 Skilled OT intervention completed with focus on limb care education, functional transfers, sit > stands. Pt received upright in bed, agreeable to session. Headache reported though unrated; pre-medicated. OT offered rest breaks, repositioning and distraction throughout for pain reduction.  Pt was already dressed and ready this session, however did recall not changing out her shrinker. Doffed current one independently. L residual limb on incisional aspect noted to have soft but black appearance (not blood); nurse notified for assessment however did not attend during session. Donned clean 2X shrinker with set up A while OT cleaned dirty one.   Transitioned to EOB with supervision, donned R shoe with insole with min A needed only for laces. Then with increased time, pt was able to teach back correct steps for prepping for SB transfer, with min A. Needed cues for lifting arm rest of chair, and sequencing which way to lean, but then pt was able to place board under R hip with close supervision, then supervision increasing to CGA/min A as pt with poor safety awareness and problem solving, attempting to stand from SB in order to shift. Re-education provided on SB vs squat pivot.  Discussed with pt the plan for extension for family ed with husband [name]. Pt was only receptive to a 2 day extension but care team notified of request for formal education to be arranged. MD present for this discussion as well.   Transported dependently in w/c <> gym for energy conservation. Completed x3 sit > stands using RW with mod cues for  positioning and Rt foot on dycem for visual reminder of correct RLE placement. Required initial min A increasing to mod A with onset of fatigue. Able to maintain stance with upright gaze and proper hip alignment with verbal cues, for total of about 1 min each trial when given distraction of stating 5 things she's thankful for while standing. Noted R knee hyperextension with fatigue onset as pt searched for external support of Rt knee on w/c to support balance. Able to correct with cues to "soften the knee" prior to sitting.   Back in room, pt completed min A squat pivot to EOB using bed rail, max cues as pt with attempt to stand fully erect to pivot instead of squatting. Supervision transition to upright in bed. Pt remained upright in bed, with bed alarm on/activated, and with all needs in reach at end of session.  Session 2 Skilled OT intervention completed with focus on family/DME education. Pt received upright in bed, agreeable to session. No pain reported.  CSW notified OT of current denial from insurance to assist with wide drop arm BSC. Discussed with pt/husband, [name], about trying standard drop arm BSC to avoid high OOP costs, and both agreeable.  Transitioned to EOB with supervision, donned R shoe with insole with total A for time. [name] assisted pt with mod A board placement under R hip, then CGA slideboard transfer to w/c with OT needing to cue pt and [name] for safety vs [name] only. Transported pt dependently in w/c <> ADL apartment.  Education provided about standard drop arm features and safety concerns vs wide  BSC. Pt required 2 attempts to complete transfer, max cues for safety and mod A to squat pivot from w/c > L onto standard drop arm BSC. Attempted lateral lean however pt required min A for sitting balance due to instability.   [name] is only able to provide CGA/min A at DC, and due to pt fall risk, and caregiver inability, OT ultimately recommended that they purchase wide drop arm despite  insurance coverage as it is a liability. Discussed how investing in the wider commode would be smart for aging in place, especially now as a new amputee, diabetic and someone with poor wound healing who is at greater risk for losing her contralateral leg also. Both in agreement. Showed [name] options for purchase online.  Back in room, pt direct care was handed off to next PT. All needs met at end of session.   Therapy Documentation Precautions:  Precautions Precautions: Fall Precaution Comments: monitor BP and O2 Restrictions Weight Bearing Restrictions: Yes LLE Weight Bearing: Non weight bearing    Therapy/Group: Individual Therapy  Melvyn Novas, MS, OTR/L  01/28/2023, 4:02 PM

## 2023-01-28 NOTE — Progress Notes (Signed)
Patient ID: Cassandra [name], female   DOB: Mar 01, 1933, 89 y.o.   MRN: [id number]  Sw informed pt discharge extended to 5/31. Spouse at bedside. Family education scheduled the remaining of the week 11 AM- 12 PM and 1 PM- 2 PM.

## 2023-01-28 NOTE — Progress Notes (Signed)
Patient ID: Cassandra [name], female   DOB: [birth year], 89 y.o.   MRN: [id number] Met with the patient and spouse to address concerns about assistance at home in addition to [facility] services. Provided a list of private duty agencies she or the spouse can contact to provide assistance for about 4 hours three days a week to assist with care. Continue to follow along to address educational needs for discharge. [name]

## 2023-01-28 NOTE — Progress Notes (Signed)
Patient ID: Cassandra [name], female   DOB: 12/01/1932, 89 y.o.   MRN: [id number]  Contact information provided for [employer] to make co-payment for Bari Drop arm.

## 2023-01-28 NOTE — Progress Notes (Signed)
Inpatient Rehabilitation Discharge Medication Review by a Pharmacist  A complete drug regimen review was completed for this patient to identify any potential clinically significant medication issues.  High Risk Drug Classes Is patient taking? Indication by Medication  Antipsychotic No   Anticoagulant Yes Apixaban- AF  Antibiotic No   Opioid Yes Tramadol- acute pain  Antiplatelet Yes Plavix- CAD- stent  Hypoglycemics/insulin Yes Metformin- T2DM (eGFR 47)  Vasoactive Medication Yes Demadex, lopressor, nitroSL- HTN, angina  Chemotherapy No   Other Yes Robaxin- muscle spasms Lyrica- phantom pain Pristiq- phantom pain Atarax- itching Synthroid- hypothyroidism Melatonin- sleep Crestor- HLD     Type of Medication Issue Identified Description of Issue Recommendation(s)  Drug Interaction(s) (clinically significant)     Duplicate Therapy     Allergy     No Medication Administration End Date     Incorrect Dose     Additional Drug Therapy Needed     Significant med changes from prior encounter (inform family/care partners about these prior to discharge).    Other       Clinically significant medication issues were identified that warrant physician communication and completion of prescribed/recommended actions by midnight of the next day:  No  Time spent performing this drug regimen review (minutes):  30   Kwabena Strutz BS, PharmD, BCPS Clinical Pharmacist 01/28/2023 7:32 AM  Contact: [phone] after 3 PM  "Be curious, not judgmental..." -Debbora Dus

## 2023-01-28 NOTE — Progress Notes (Signed)
Physical Therapy Session Note  Patient Details  Name: Cassandra [name] MRN: [id number] Date of Birth: [birth year]  Today's Date: 01/28/2023 PT Individual Time: 1101-1157 and 9629-5284 PT Individual Time Calculation (min): 56 min and 40 min  Short Term Goals: Week 1:  PT Short Term Goal 1 (Week 1): pt will transfer supine<>sitting EOB with CGA PT Short Term Goal 1 - Progress (Week 1): Met PT Short Term Goal 2 (Week 1): pt will transfer sit<>stand with LRAD and min A PT Short Term Goal 2 - Progress (Week 1): Met PT Short Term Goal 3 (Week 1): pt will transfer bed<>chair with LRAD and CGA PT Short Term Goal 3 - Progress (Week 1): Progressing toward goal Week 2:  PT Short Term Goal 1 (Week 2): STG=LTG due to LOS  Skilled Therapeutic Interventions/Progress Updates:   Treatment Session 1 Received pt semi-reclined in bed with Cassandra [name] present for family education training. Pt agreeable to PT treatment and did not complain of pain during session. Session with emphasis on discharge planning, functional mobility/transfers, generalized strengthening and endurance, and simulated car transfers. Donned limb guard in supine with max A provided by Cassandra [name]. Pt transferred supine<>sitting EOB from flat bed using bedrails and supervision with increased time. Donned R shoe with orthotic and max A provided by Cassandra [name] and Cassandra [name] instructed in technique to set up slideboard transfer. Pt transferred bed<>WC via slideboard to R with CGA and assist to place slideboard. Pt transported to/from room in [name] dependently for time management purposes.    Pt performed simulated car transfer via slideboard with min A provided by Cassandra [name] going uphill and CGA when going downhill to exit the car. Pt required max cues for technique and to keep RLE on floor as pt attempting to slide out of car with both LEs still in car. Pt transferred WC<>Nustep via slideboard with min A going uphill (provided by therapist) and CGA when going downhill (provided by  [name]). Pt performed BUE and RLE strengthening on Nustep at workload 1 for 8 minutes for a total of 313 steps with emphasis on cardiovascular endurance. Pt required multiple rest breaks in between due to fatigue. Returned to room and concluded session with pt sitting in [name] with all needs within reach. Pt reporting urge to toilet - notified NT due to time restrictions.   Discussed/Educated Cassandra [name] on the following: -technique for donning/doffing limb guard -purchasing bed assist rail for improved independence with bed mobility  -WC parts management (donning/doffing legrests and armrests) -technique/set up for slideboard transfers -that pt is NOT to attempt to ambulate with anyone other than PT/OT at home due to instability of R knee  Treatment Session 2 Received pt sitting in [name] finishing with OT with Cassandra [name] present at bedside. Pt agreeable to PT treatment and reported pain in L residual limb but not due for more pain medicine yet. Pt requested to return to bed to work on exercises. Cassandra [name] assisted pt with donning limb guard and set up transfer to bed with min cues for technique. Pt transferred WC<>bed via slideboard with CGA and total A from [name] to place board - cues to keep R foot in contact with floor and for which direction to scoot in bed, as pt attempting to scoot to foot of bed rather than head of bed. Removed limb guard and R shoe and pt transferred sit<>supine with supervision using bedrails. Pt performed the following exercises with emphasis on LE strength, ROM, and contracture prevention: -SLR 2x10 bilaterally -single leg bridge 2x10 with  therapist stabilizing RLE -hip abduction with red TB 2x10 -R sidelying R hip abduction 2x10 -hip adduction pillow squeezes 2x10 with 3 second isometric hold Concluded session with pt semi-reclined in bed, needs within reach, and bed alarm on.  Therapy Documentation Precautions:  Precautions Precautions: Fall Precaution Comments: monitor BP and  O2 Restrictions Weight Bearing Restrictions: Yes LLE Weight Bearing: Non weight bearing  Therapy/Group: Individual Therapy [name] PT, DPT 01/28/2023, 6:46 AM

## 2023-01-29 LAB — GLUCOSE, CAPILLARY
Glucose-Capillary: 102 mg/dL — ABNORMAL HIGH (ref 70–99)
Glucose-Capillary: 120 mg/dL — ABNORMAL HIGH (ref 70–99)
Glucose-Capillary: 136 mg/dL — ABNORMAL HIGH (ref 70–99)
Glucose-Capillary: 159 mg/dL — ABNORMAL HIGH (ref 70–99)

## 2023-01-29 MED ORDER — GABAPENTIN 100 MG PO CAPS: 100.0000 mg | ORAL_CAPSULE | Freq: Two times a day (BID) | ORAL | Status: DC | PRN

## 2023-01-29 NOTE — Progress Notes (Addendum)
Physical Therapy Session Note  Patient Details  Name: Cassandra [name] MRN: [id number] Date of Birth: 01-19-1933  Today's Date: 01/29/2023 PT Individual Time: 1191-4782 and 9562-1308 PT Individual Time Calculation (min): 54 min and 54 min  Short Term Goals: Week 1:  PT Short Term Goal 1 (Week 1): pt will transfer supine<>sitting EOB with CGA PT Short Term Goal 1 - Progress (Week 1): Met PT Short Term Goal 2 (Week 1): pt will transfer sit<>stand with LRAD and min A PT Short Term Goal 2 - Progress (Week 1): Met PT Short Term Goal 3 (Week 1): pt will transfer bed<>chair with LRAD and CGA PT Short Term Goal 3 - Progress (Week 1): Progressing toward goal Week 2:  PT Short Term Goal 1 (Week 2): STG=LTG due to LOS  Skilled Therapeutic Interventions/Progress Updates:   Treatment Session 1 Received pt sitting in WC, pt agreeable to PT treatment, and when asked about pain level stated "lets not talk about that". Session with emphasis on functional mobility/transfers, generalized strengthening and endurance, and dynamic standing balance/coordination. Pt performed WC mobility 165ft using BUE and supervision with increased time and multiple rest breaks to main therapy gym - emphasis on UE strength and endurance. Pt performed x 2 slideboard transfers to/from mat with close supervision with therapist placing/removing board. Pt performed 2x5 sit<>stands with RW from EOM with CGA fading to min A with fatigue - cues to push up from mat rather than pulling up on RW and to reach back prior to sitting. Pt performed the following exercises sitting EOM with emphasis on UE/LE strength/ROM: -modified crunches on wedge 2x10 progressing to modified crunches on wedge with ball toss x10 reps -lateral trunk rotations with 2lb dumbbell 2x10 bilaterally -L knee extension 2x12 with 2.5lb ankle weight -L hip flexion 2x12 with 2.5lb ankle weight -tricep extensions 2x5 Pt still demonstrates fluctuating levels of confusion  at one point stating "look at that man over there doing the same exercises as me", completley unaware that she was looking in the mirror. Pt transported back to room in [facility] dependently and sat in WC at sink and brushed gums. Pt then reported urge to void and soiled brief before therapist could transfer her to commode. Concluded session with pt sitting in WC, needs within reach, and seatbelt alarm on - NT notified of need assist pt with toileting/hygiene due to time restrictions.   Treatment Session 2 Received pt semi-reclined in bed with [name] present for continued family education. Scott from [place] present to deliver new shrinkers. Removed old shrinker without assist and donned size L shrinker with total A due to pain. Educated pt/Cassandra [name] on shrinker wear and care schedule and reinforced importance to avoiding putting shrinker in [employer], [employer]. Pt agreeable to PT treatment but reported increased pain in residual limb and requested to work on exercises due to fatigue/pain - RN notified.   Provided pt/Cassandra [name] with contracture prevention handout, information on amputee support group, and HEP and educated on frequency/duration/technique for the following exercises [sidelying hip and knee AROM, sidelying hip abduction, supine hip adduction/abduction, supine hip flexion, single leg bridge, supine hamstring stretch, supine quad set, supine crunches, supine glute squeezes, prone hip extension, and prone knee flexion}. Pt performed the following exercises with emphasis on LE strength, ROM, and contracture prevention: -hip adduction towel squeezes 2x10 -L hip abduction 2x10 -R sidelying L hip abduction 2x10 -R sidelying L hip flexion 2x10 -R sidelying L hip extension 2x10 -R single leg bridge 2x8 -L SLR 2x10 Pt scooted to  HOB with supervision pulling on headboard and concluded session with pt semi-reclined in bed, needs within reach, and bed alarm on.   Therapy Documentation Precautions:  Precautions Precautions:  Fall Precaution Comments: monitor BP and O2 Restrictions Weight Bearing Restrictions: Yes LLE Weight Bearing: Non weight bearing  Therapy/Group: Individual Therapy [name] PT, DPT 01/29/2023, 7:09 AM

## 2023-01-29 NOTE — Progress Notes (Signed)
Orthopedic Tech Progress Note Patient Details:  Cassandra [name] Jul 15, 1933 [id number]  Called in order to [employer] for a large shrinker   Patient ID: Cassandra [name], female   DOB: 08/01/1933, 89 y.o.   MRN: [id number]  [name] 01/29/2023, 1:55 PM

## 2023-01-29 NOTE — Progress Notes (Signed)
Patient ID: Cassandra [name], female   DOB: Oct 19, 1932, 89 y.o.   MRN: [id number]  All DME ordered through [employer]. HH established with [facility].

## 2023-01-29 NOTE — Progress Notes (Signed)
Occupational Therapy Session Note  Patient Details  Name: Cassandra [name] MRN: [id number] Date of Birth: [birth year]  Today's Date: 01/29/2023 OT Individual Time: 4098-1191 OT Individual Time Calculation (min): 26 min    Short Term Goals: Week 2:  OT Short Term Goal 1 (Week 2): STG = LTG due to ELOS  Skilled Therapeutic Interventions/Progress Updates:  Skilled OT intervention completed with focus on family education with husband, [name], present. Pt received upright in bed, agreeable to session. No pain reported.  Education provided on the following during session: -How to set up wide drop arm BSC and options for placement in home at bedside or over top of toilet with safety features also discussed -Wearing XL shrinker once delivered to room as her amputation has reduced edema and circumference -Wearing tennis shoe on R foot for efficiency with transfers depending on height of w/c at home as pt with shorter leg length and often hovers it in the air vs problem solving scooting forward to w/c -How to position w/c, lifting the arm rest, board safety/placement -Positioning w/c towards HOB in prep for transfer to avoid pt being halfway down in bed and assist needed later for boosting towards [facility]  Demo/hands on training during session: -[name] assisted pt with supervision bed mobility to EOB, then min A placement of slide board under R hip, then CGA transfer to w/c with OT needing to provide cues to [place] for safety and body mechanics -[name] assisted pt with min A squat pivot > back to EOB, however max cues for safety from OT as [name] with lack of cueing with pt impulsivity  Pt remained upright in bed, with bed alarm on/activated, and with all needs in reach at end of session.   Therapy Documentation Precautions:  Precautions Precautions: Fall Precaution Comments: monitor BP and O2 Restrictions Weight Bearing Restrictions: Yes LLE Weight Bearing: Non weight bearing    Therapy/Group:  Individual Therapy  Melvyn Novas, MS, OTR/L  01/29/2023, 12:03 PM
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
## 2023-01-29 NOTE — Progress Notes (Signed)
Occupational Therapy Session Note  Patient Details  Name: Cassandra [name] MRN: [id number] Date of Birth: 11-Jun-1933  Today's Date: 01/29/2023 OT Individual Time: 0805-0900 OT Individual Time Calculation (min): 55 min    Short Term Goals: Week 2:  OT Short Term Goal 1 (Week 2): STG = LTG due to ELOS  Skilled Therapeutic Interventions/Progress Updates:      Therapy Documentation Precautions:  Precautions Precautions: Fall Precaution Comments: monitor BP and O2 Restrictions Weight Bearing Restrictions: Yes LLE Weight Bearing: Non weight bearing  General: Pt supine in bed upon OT arrival, agreeable to OT session.   Pain: Pt reports 0/10 pain    ADL: Pt completed ADL this date with the following levels of function:  UB dressing: set up for overhead shirt seated in W/C LB dressing: Min A for brief, total A for pants d/t time constraints, set up for donning shrinker (2x size), W/C push up for managing pants over hips Footwear: Total A for tennis shoe d/t time constraint Bathing: SBA, able to bathe all body parts, VC for safety and lateral lean for peri care Shower transfer: Min A stand pivot with assistance from grab bars, 2 trials  Grooming: Mod I for hair care seated in W/C at sink Transfers: -EOB>W/C squat pivot transfer Min A with VC for hand placement    Session focus on ADL retraining and functional endurance . Pt seated in W/C at end of session with W/C alarm donned, call light within reach and 4Ps assessed.   Therapy/Group: Individual Therapy  Melvyn Novas, MS, OTR/L  01/30/2023, 7:58 AM

## 2023-01-29 NOTE — Patient Care Conference (Signed)
Inpatient RehabilitationTeam Conference and Plan of Care Update Date: 01/29/2023   Time: 11:44 AM    Patient Name: Cassandra [name]      Medical Record Number: [id number]  Date of Birth: Jun 05, 1933 Sex: Female         Room/Bed: 4W05C/4W05C-01 Payor Info: Payor: Advertising copywriter MEDICARE / Plan: [facility] MEDICARE / Product Type: *No Product type* /    Admit Date/Time:  01/14/2023  4:06 PM  Primary Diagnosis:  Left below-knee amputee [facility])  Hospital Problems: Principal Problem:   Left below-knee amputee [facility])    Expected Discharge Date: Expected Discharge Date: 01/31/23  Team Members Present: Physician leading conference: Dr. Sula Soda Social Worker Present: Lavera Guise, BSW Nurse Present: Chana Bode, RN PT Present: Raechel Chute, PT OT Present: Candee Furbish, OT SLP Present: Other (comment) Fae Pippin, SLP) PPS Coordinator present : Fae Pippin, SLP     Current Status/Progress Goal Weekly Team Focus  Bowel/Bladder   Continent/incontinent of B/B   Decrease incontinent episodes   Assist with toileting as needed    Swallow/Nutrition/ Hydration               ADL's   Poor awareness and problem, with fluctuating assist depending on fatigue/present mood but has been as little as supervision for UB bathing/dressing, Min A toileting if following commands but very little carryover with all tasks   Supervision; if agreeable to extend will need to downgrade to grossly CGA/min A   Family education, sitting/standing balance, DME education, functional transfers, endurance    Mobility   bed mobility supervision using bed features, slideboard transfers CGA on level surfaces (min A on unlevel surfaces when fatigued), sit<>stands with RW min A (can be mod A when fatigued), WC mobility 136ft supervision - unable to pivot on RLE or ambulate/"hop". Pt fluctuates with fatigue   supervision - D/C'd gait goals  functional mobility/transfers, generalized strengthening and  endurance, dynamic standing balanace/coordination, pain management, limb loss education, and D/C planning    Communication                Safety/Cognition/ Behavioral Observations               Pain   C/o Lt BKA pain, PRN tramadol available and being used   Pain <3/10   Assess Qshift and prn    Skin   Lt BKA, RLE skin tear   Promote healing and prevention of infection  Assess Qshift and prn      Discharge Planning:  Patient discharging home tomorrow with spouse.   Team Discussion: Patient with hallucinations (post BI) improved and more talkative/interactive since medications reduced.  Little pain noted; darkened area of skin around incision line, tolerating shrinker.  Mouth ulcers healing.  Continue to note slow progress with poor awareness, problem solving deficits and assistance with self care needs fluctuates. Unable to pivot on right foot, hop and knee buckles.  Patient on target to meet rehab goals: yes, currently needs CGA for sit - stand transfers, working on slide board transfers.  Needs min assist for toileting and max assist for lower body bathing and dressing. Goals for discharge set for supervision overall.  *See Care Plan and progress notes for long and short-term goals.   Revisions to Treatment Plan:  LOS extended for family education   Teaching Needs: Safety, transfers, toileting, medications, dietary modifications, skin care, etc.   Current Barriers to Discharge: Decreased caregiver support and Weight bearing restrictions and Home environment access/layout  Possible Resolutions to  Barriers: Family education HH follow up services Private duty service contact list provided to patient and spouse DME: Bariatric drop arm BSC, slide board, amputee support pad     Medical Summary Current Status: intermittent severe phantom limb pain, dental pain  Barriers to Discharge: Medical stability  Barriers to Discharge Comments: intermittent severe phantom limb  pain, dental pain Possible Resolutions to [employer] Focus: add gabapentin 100mg  BID prn, continue normal saline and lidocaine swish and swallow, encouraged outpatient dental follow-up   Continued Need for Acute Rehabilitation Level of Care: The patient requires daily medical management by a physician with specialized training in physical medicine and rehabilitation for the following reasons: Direction of a multidisciplinary physical rehabilitation program to maximize functional independence : Yes Medical management of patient stability for increased activity during participation in an intensive rehabilitation regime.: Yes Analysis of laboratory values and/or radiology reports with any subsequent need for medication adjustment and/or medical intervention. : Yes   I attest that I was present, lead the team conference, and concur with the assessment and plan of the team.   Chana Bode B 01/29/2023, 2:57 PM

## 2023-01-30 LAB — GLUCOSE, CAPILLARY
Glucose-Capillary: 108 mg/dL — ABNORMAL HIGH (ref 70–99)
Glucose-Capillary: 161 mg/dL — ABNORMAL HIGH (ref 70–99)
Glucose-Capillary: 102 mg/dL — ABNORMAL HIGH (ref 70–99)
Glucose-Capillary: 150 mg/dL — ABNORMAL HIGH (ref 70–99)

## 2023-01-30 MED ORDER — TORSEMIDE 20 MG PO TABS: 20.0000 mg | ORAL_TABLET | Freq: Every day | ORAL | 0 refills | Status: AC

## 2023-01-30 MED ORDER — GABAPENTIN 100 MG PO CAPS: 100.0000 mg | ORAL_CAPSULE | Freq: Two times a day (BID) | ORAL | 0 refills | Status: DC | PRN

## 2023-01-30 NOTE — Progress Notes (Signed)
Occupational Therapy Discharge Summary  Patient Details  Name: Cassandra [name] MRN: [id number] Date of Birth: 1932/11/12  Date of Discharge from OT service:Jan 30, 2023   Patient has met 6 of 11 long term goals due to improved activity tolerance, improved balance, postural control, functional use of  RIGHT upper, RIGHT lower, and LEFT upper extremity, improved awareness, and improved coordination.  Patient to discharge at [facility] Assist to Supervision level.  Patient's care partner is independent to provide the necessary physical and cognitive assistance at discharge. Pt's husband Cassandra [name] has been present for numerous hands on training sessions and has verbalized his readiness to assist pt at her CLOF and demonstrated safe ability to do so with all ADLs and functional transfers.  Reasons goals not met: Sit > stand, dynamic standing, toileting, sitting balance, and anticipatory awareness goals not met due to fluctuating cognition, confusion, endurance and balance requiring increased assist.  Recommendation:  Patient will benefit from ongoing skilled OT services in home health setting to continue to advance functional skills in the area of BADL, iADL, and Reduce care partner burden.  Equipment: Wide drop arm BSC  Reasons for discharge: treatment goals met  Patient/family agrees with progress made and goals achieved: Yes  OT Discharge Precautions/Restrictions  Precautions Precautions: Fall Precaution Comments: monitor BP and O2 Restrictions Weight Bearing Restrictions: Yes LLE Weight Bearing: Non weight bearing ADL ADL Eating: Set up Where Assessed-Eating: Wheelchair Grooming: Modified independent Where Assessed-Grooming: Sitting at sink Upper Body Bathing: Modified independent Where Assessed-Upper Body Bathing: Shower Lower Body Bathing: Supervision/safety Where Assessed-Lower Body Bathing: Shower Upper Body Dressing: Setup Where Assessed-Upper Body Dressing:  Wheelchair Lower Body Dressing: Supervision/safety Where Assessed-Lower Body Dressing: Bed level Toileting: Minimal assistance Where Assessed-Toileting: Bedside Commode Toilet Transfer: Close supervision Toilet Transfer Method: Scientist, research (life sciences): Extra wide drop arm bedside commode Tub/Shower Transfer: Close supervison Web designer Method: Psychologist, prison and probation services: Insurance underwriter: Administrator, arts Method: Squat pivot, Stand pivot Astronomer: Grab bars, Sales promotion account executive Baseline Vision/History: 1 Wears glasses Patient Visual Report: No change from baseline Vision Assessment?: No apparent visual deficits Perception  Perception: Within Functional Limits Praxis Praxis: Intact Cognition Cognition Overall Cognitive Status: History of cognitive impairments - at baseline Arousal/Alertness: Awake/alert Orientation Level: Person;Place;Situation Person: Oriented Place: Oriented Situation: Oriented Memory: Impaired Awareness: Impaired Problem Solving: Impaired Safety/Judgment: Impaired Brief Interview for Mental Status (BIMS) Repetition of Three Words (First Attempt): 3 Temporal Orientation: Year: Missed by more than 5 years (answered 1924) Temporal Orientation: Month: Accurate within 5 days Temporal Orientation: Day: Correct Recall: "Sock": Yes, no cue required Recall: "Blue": Yes, no cue required Recall: "Bed": Yes, no cue required BIMS Summary Score: 12 Sensation Sensation Light Touch: Appears Intact Hot/Cold: Appears Intact Proprioception: Appears Intact Stereognosis: Not tested Additional Comments: pt mostly limited by phantom pain Coordination Gross Motor Movements are Fluid and Coordinated: No Fine Motor Movements are Fluid and Coordinated: Yes Coordination and Movement Description: 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, poor problem solving, generalized  weakness/deconditioning, and pain Finger Nose Finger Test: [facility] but slow Motor  Motor Motor: Abnormal postural alignment and control Motor - Skilled Clinical Observations: 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, poor problem solving, generalized weakness/deconditioning, and pain Mobility  Bed Mobility Bed Mobility: Rolling Right;Rolling Left;Sit to Supine;Supine to Sit Rolling Right: Independent with assistive device Rolling Left: Independent with assistive device Supine to Sit: Independent with assistive device Sit to Supine: Independent with assistive device  Transfers Sit to Stand: Contact Guard/Touching assist Stand to Sit: Supervision/Verbal cueing  Trunk/Postural Assessment  Cervical Assessment Cervical Assessment: Exceptions to [facility] (forward head) Thoracic Assessment Thoracic Assessment: Exceptions to [facility] (age related kyphosis) Lumbar Assessment Lumbar Assessment: Exceptions to [facility] (posterior pelvic tilt in sitting) Postural Control Postural Control: Deficits on evaluation Trunk Control: delayed Protective Responses: delayed  Balance Balance Balance Assessed: Yes Static Sitting Balance Static Sitting - Balance Support: Feet supported;Bilateral upper extremity supported Static Sitting - Level of Assistance: 6: Modified independent (Device/Increase time) Dynamic Sitting Balance Dynamic Sitting - Balance Support: Feet supported;No upper extremity supported Dynamic Sitting - Level of Assistance: 5: Stand by assistance (supervision) Static Standing Balance Static Standing - Balance Support: Bilateral upper extremity supported;During functional activity (RW) Static Standing - Level of Assistance: 5: Stand by assistance (RW) Dynamic Standing Balance Dynamic Standing - Balance Support: Bilateral upper extremity supported;During functional activity (RW) Dynamic Standing - Level of Assistance: 4: Min assist Extremity/Trunk Assessment RUE Assessment RUE Assessment: Exceptions to  [facility] Active Range of Motion (AROM) Comments: WFL; arthritic hands limiting full ROM but functional General Strength Comments: 4-5 grossly LUE Assessment LUE Assessment: Exceptions to [facility] Active Range of Motion (AROM) Comments: WFL; arthritic hands limiting full ROM but functional General Strength Comments: 4-/5 grossly   Teneka Malmberg E Jerre Diguglielmo, MS, OTR/L  01/30/2023, 4:27 PM

## 2023-01-30 NOTE — Progress Notes (Signed)
Physical Therapy Session Note  Patient Details  Name: NELSON WESTERGAARD MRN: [id number] Date of Birth: 02-23-1933  Today's Date: 01/30/2023 PT Individual Time: [phone] and [phone] PT Individual Time Calculation (min): 41 min and 34 min PT Missed Time: 26 minutes PT Missed Time Reason: Fatigue, pain, and unwillingness to participate  Short Term Goals: Week 1:  PT Short Term Goal 1 (Week 1): pt will transfer supine<>sitting EOB with CGA PT Short Term Goal 1 - Progress (Week 1): Met PT Short Term Goal 2 (Week 1): pt will transfer sit<>stand with LRAD and min A PT Short Term Goal 2 - Progress (Week 1): Met PT Short Term Goal 3 (Week 1): pt will transfer bed<>chair with LRAD and CGA PT Short Term Goal 3 - Progress (Week 1): Progressing toward goal Week 2:  PT Short Term Goal 1 (Week 2): STG=LTG due to LOS  Skilled Therapeutic Interventions/Progress Updates:   Treatment Session 1 Received pt sitting in WC taking medications and MD arrived for morning rounds. Pt agreeable to PT treatment, and reported pain 6-7/10 in L residual limb. Session with emphasis on discharge planning, functional mobility/transfers, generalized strengthening and endurance, and dynamic standing balance/coordination. Went through sensation, MMT, and pain interference questionnaire. Donned limb guard with max A and pt transported to/from room in [facility] dependently for time management purposes. Pt stood from [facility] with RW and max A (cues to scoot to edge of chair, for hand placement on WC armrests and RW, and R knee extension in stance) and attempted to pick up tissue box from floor using reacher, however when pt let go of RW with RUE, demonstrated posterior LOB and buckling of R knee, requiring max A to control descent back into WC. Pt then performed WC mobility 92ft using BUE and supervision to fatigue and transported back to room in [facility] dependently. Concluded session with pt sitting in WC, needs within reach, and seatbelt alarm  on.  Treatment Session 2 Received pt sitting in WC with [name] present for continued family education training. Pt initially refused to participate stating, "I'm too tired and sore, I am not doing anything". With maximal encouragement, pt agreed to practice car transfer once more. Pt transported to/from room in [facility] dependently for time agreement purposes, however simulated car not available and pt unwilling to participate in any other tasks, requesting to return to bed. [name] assisted pt with transfer back to bed with one cue to remove legrests prior to transferring. Pt transferred WC<>bed via slideboard uphill with CGA provided by [name]. Pt required cues for safety as she attempted to take her shoe off before transferring. [name] removed R shoe and limb guard dependently and pt transferred sit<>supine mod I and scooted to [facility] with supervision pulling on headboard. Concluded session with pt semi-reclined in bed, needs within reach, and bed alarm on. 26 minutes missed of skilled physical therapy due to pain, fatigue, and unwillingness to participate.   Therapy Documentation Precautions:  Precautions Precautions: Fall Precaution Comments: monitor BP and O2 Restrictions Weight Bearing Restrictions: Yes LLE Weight Bearing: Non weight bearing  Therapy/Group: Individual Therapy [name] PT, DPT 01/30/2023, 7:25 AM
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
## 2023-01-30 NOTE — Progress Notes (Signed)
Physical Therapy Discharge Summary  Patient Details  Name: Cassandra [name] MRN: [id number] Date of Birth: Oct 15, 1932  Date of Discharge from PT service:Jan 30, 2023  Patient has met 6 of 7 long term goals due to improved activity tolerance, improved balance, improved postural control, increased strength, increased range of motion, ability to compensate for deficits, improved awareness, and improved coordination. Patient to discharge at a wheelchair level Supervision using slideboard. Patient's care partner is independent to provide the necessary physical and cognitive assistance at discharge. Pt's husband, Cassandra [name], has been present for multiple hands on education sessions and has verbalized and demonstrated confidence with tasks to ensure safe discharge home. Have reinforced to pt and Cassandra [name] that pt is not to attempt to ambulate/"hop" at home unless with therapy.   Reasons goals not met: pt did not meet dynamic standing goal of CGA as pt currently requires min A for dynamic standing balance due to weakness/deconditioning, decreased standing balance/coordination, and instability in R knee.   Recommendation:  Patient will benefit from ongoing skilled PT services in home health setting to continue to advance safe functional mobility, address ongoing impairments in transfers, generalized strengthening and endurance, dynamic standing balance/coordination, limb loss education, and to minimize fall risk.  Equipment: L amputee support pad, slideboard, bed assist rail  Reasons for discharge: treatment goals met and discharge from hospital  Patient/family agrees with progress made and goals achieved: Yes  PT Discharge Precautions/Restrictions Precautions Precautions: Fall (Simultaneous filing. User may not have seen previous data.) Precaution Comments: monitor BP and O2 Restrictions Weight Bearing Restrictions: Yes (Simultaneous filing. User may not have seen previous data.) LLE Weight Bearing: Non  weight bearing (Simultaneous filing. User may not have seen previous data.) Pain Interference Pain Interference Pain Effect on Sleep: 1. Rarely or not at all Pain Interference with Therapy Activities: 1. Rarely or not at all Pain Interference with Day-to-Day Activities: 1. 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 Coordination Gross Motor Movements are Fluid and Coordinated: No Fine Motor Movements are Fluid and Coordinated: Yes Coordination and Movement Description: 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, poor problem solving, generalized weakness/deconditioning, and pain Finger Nose Finger Test: [facility] but slow Heel Shin Test: unable to perform on LLE due to BKA Motor  Motor Motor: Abnormal postural alignment and control Motor - Skilled Clinical Observations: 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, poor problem solving, generalized weakness/deconditioning, and pain  Mobility Bed Mobility Bed Mobility: Rolling Right;Rolling Left;Sit to Supine;Supine to Sit Rolling Right: Independent with assistive device Rolling Left: Independent with assistive device Supine to Sit: Independent with assistive device Sit to Supine: Independent with assistive device Transfers Transfers: Sit to Stand;Stand to Sit;Lateral/Scoot Transfers Sit to Stand: Contact Guard/Touching assist Stand to Sit: Supervision/Verbal cueing Lateral/Scoot Transfers: Supervision/Verbal cueing Transfer (Assistive device): Other (Comment) (slideboard) Locomotion  Gait Ambulation:  No Gait Gait: No Stairs / Additional Locomotion Stairs: No Corporate treasurer: Yes Wheelchair Assistance: Doctor, general practice: Both upper extremities Wheelchair Parts Management: Needs assistance Distance: 166ft  Trunk/Postural Assessment  Cervical Assessment Cervical Assessment: Exceptions to [facility] (forward head) Thoracic Assessment Thoracic Assessment: Exceptions to [facility] (age related kyphosis) Lumbar Assessment Lumbar Assessment: Exceptions to [facility] (posterior pelvic tilt  in sitting) Postural Control Postural Control: Deficits on evaluation Trunk Control: delayed Protective Responses: delayed  Balance Balance Balance Assessed: Yes Static Sitting Balance Static Sitting - Balance Support: Feet supported;Bilateral upper extremity supported Static Sitting - Level of Assistance: 6: Modified independent (Device/Increase time) Dynamic Sitting Balance Dynamic Sitting - Balance Support: Feet supported;No upper extremity supported Dynamic Sitting - Level of Assistance: 5: Stand by assistance (supervision) Static Standing Balance Static Standing - Balance Support: Bilateral upper extremity supported;During functional activity (RW) Static Standing - Level of Assistance: 5: Stand by assistance (CGA) Dynamic Standing Balance Dynamic Standing - Balance Support: Bilateral upper extremity supported;During functional activity (RW) Dynamic Standing - Level of Assistance: 4: Min assist Extremity Assessment  RLE Assessment RLE Assessment: Exceptions to [facility] General Strength Comments: tested sitting in WC RLE Strength Right Hip Flexion: 4-/5 Right Hip ABduction: 4-/5 Right Hip ADduction: 4-/5 Right Knee Flexion: 3+/5 Right Knee Extension: 4-/5 Right Ankle Dorsiflexion: 3+/5 Right Ankle Plantar Flexion: 4-/5 LLE Assessment LLE Assessment: Exceptions to [facility] General Strength Comments: tested sitting in WC LLE Strength Left Hip Flexion:  4-/5 Left Hip ABduction: 4-/5 Left Hip ADduction: 4-/5 Left Knee Flexion: 3+/5 Left Knee Extension: 3+/5  Cassandra [name] Zaunegger [name] PT, DPT 01/30/2023, 7:55 AM

## 2023-01-30 NOTE — Plan of Care (Signed)
  Problem: RH Balance Goal: LTG: Patient will maintain dynamic sitting balance (OT) Description: LTG:  Patient will maintain dynamic sitting balance with assistance during activities of daily living (OT) Outcome: Not Met (add Reason) Flowsheets (Taken 01/30/2023 1627) LTG: Pt will maintain dynamic sitting balance during ADLs with: (Not met due to increased assist needed for balance, coordination and cognition deficits) -- Goal: LTG Patient will maintain dynamic standing with ADLs (OT) Description: LTG:  Patient will maintain dynamic standing balance with assist during activities of daily living (OT)  Outcome: Not Met (add Reason) Flowsheets (Taken 01/30/2023 1627) LTG: Pt will maintain dynamic standing balance during ADLs with: (Not met due to increased assist needed for balance, coordination and cognition deficits) --   Problem: Sit to Stand Goal: LTG:  Patient will perform sit to stand in prep for activites of daily living with assistance level (OT) Description: LTG:  Patient will perform sit to stand in prep for activites of daily living with assistance level (OT) Outcome: Not Met (add Reason) Flowsheets (Taken 01/30/2023 1627) LTG: PT will perform sit to stand in prep for activites of daily living with assistance level: (Not met due to increased assist needed for balance, coordination and cognition deficits) --   Problem: RH Awareness Goal: LTG: Patient will demonstrate awareness during functional activites type of (OT) Description: LTG: Patient will demonstrate awareness during functional activites type of (OT) Outcome: Not Met (add Reason) Flowsheets (Taken 01/30/2023 1627) LTG: Patient will demonstrate awareness during functional activites type of (OT): (Not met due to increased assist needed for balance, coordination and cognition deficits) --   Problem: RH Bathing Goal: LTG Patient will bathe all body parts with assist levels (OT) Description: LTG: Patient will bathe all body parts  with assist levels (OT) Outcome: Completed/Met   Problem: RH Dressing Goal: LTG Patient will perform upper body dressing (OT) Description: LTG Patient will perform upper body dressing with assist, with/without cues (OT). Outcome: Completed/Met Goal: LTG Patient will perform lower body dressing w/assist (OT) Description: LTG: Patient will perform lower body dressing with assist, with/without cues in positioning using equipment (OT) Outcome: Completed/Met   Problem: RH Toilet Transfers Goal: LTG Patient will perform toilet transfers w/assist (OT) Description: LTG: Patient will perform toilet transfers with assist, with/without cues using equipment (OT) Outcome: Completed/Met   Problem: RH Tub/Shower Transfers Goal: LTG Patient will perform tub/shower transfers w/assist (OT) Description: LTG: Patient will perform tub/shower transfers with assist, with/without cues using equipment (OT) Outcome: Completed/Met   Problem: RH Memory Goal: LTG Patient will demonstrate ability for day to day recall/carry over during activities of daily living with assistance level (OT) Description: LTG:  Patient will demonstrate ability for day to day recall/carry over during activities of daily living with assistance level (OT). Outcome: Completed/Met

## 2023-01-30 NOTE — Progress Notes (Signed)
Patient ID: Cassandra [name], female   DOB: [birth year], 89 y.o.   MRN: [id number]  Patient DME delivered.

## 2023-01-30 NOTE — Progress Notes (Signed)
Inpatient Rehabilitation Care Coordinator Discharge Note   Patient Details  Name: Cassandra [name] MRN: [id number] Date of Birth: 1933/04/20   Discharge location: Home with spouse  Length of Stay: 17 Days  Discharge activity level: sup/cga  Home/community participation: Spouse  Patient response OZ:HYQMVH Literacy - How often do you need to have someone help you when you read instructions, pamphlets, or other written material from your doctor or pharmacy?: Often  Patient response QI:ONGEXB Isolation - How often do you feel lonely or isolated from those around you?: Never  Services provided included: MD, RD, PT, OT, SLP, RN, CM, TR, Pharmacy, Neuropsych, SW  Financial Services:  Field seismologist Utilized: Media planner [facility]  Choices offered to/list presented to: patient and spouse  Follow-up services arranged:  Home Health, DME Home Health Agency: [facility] OT SLP    DME : Drop arm, Slide board and amputee pad    Patient response to transportation need: Is the patient able to respond to transportation needs?: Yes In the past 12 months, has lack of transportation kept you from medical appointments or from getting medications?: No In the past 12 months, has lack of transportation kept you from meetings, work, or from getting things needed for daily living?: No   Patient/Family verbalized understanding of follow-up arrangements:  Yes  Individual responsible for coordination of the follow-up plan: [phone]  Confirmed correct DME delivered: Andria Rhein 01/30/2023    Comments (or additional information):  Summary of Stay    Date/Time Discharge Planning CSW  01/28/23 0957 Patient discharging home tomorrow with spouse. CJB  01/21/23 1350 Discharging home with spouse able to provide 24/7. 1 level home, level entry. 3L of 02. CJB       Andria Rhein

## 2023-01-30 NOTE — Progress Notes (Signed)
Inpatient Rehabilitation Discharge Medication Review by a Pharmacist  A complete drug regimen review was completed for this patient to identify any potential clinically significant medication issues.  High Risk Drug Classes Is patient taking? Indication by Medication  Antipsychotic No   Anticoagulant Yes Apixaban- AF  Antibiotic No   Opioid Yes Tramadol- acute pain  Antiplatelet Yes Plavix- CAD- stent  Hypoglycemics/insulin Yes Metformin- T2DM (eGFR 47)  Vasoactive Medication Yes Demadex, lopressor, nitroSL- HTN, angina  Chemotherapy No   Other Yes Robaxin- muscle spasms Lyrica- phantom pain Pristiq- phantom pain Atarax- itching Synthroid- hypothyroidism Melatonin- sleep Crestor- HLD Gabapentin- phantom pain     Type of Medication Issue Identified Description of Issue Recommendation(s)  Drug Interaction(s) (clinically significant)     Duplicate Therapy     Allergy     No Medication Administration End Date     Incorrect Dose     Additional Drug Therapy Needed     Significant med changes from prior encounter (inform family/care partners about these prior to discharge).    Other       Clinically significant medication issues were identified that warrant physician communication and completion of prescribed/recommended actions by midnight of the next day:  No  Time spent performing this drug regimen review (minutes):  30   Marquis Diles BS, PharmD, BCPS Clinical Pharmacist 02/01/2023 7:48 AM  Contact: [phone] after 3 PM  "Be curious, not judgmental..." -Debbora Dus

## 2023-01-30 NOTE — Plan of Care (Signed)
  Problem: RH Toileting Goal: LTG Patient will perform toileting task (3/3 steps) with assistance level (OT) Description: LTG: Patient will perform toileting task (3/3 steps) with assistance level (OT)  01/30/2023 1631 by Candee Furbish E, OT Outcome: Not Met (add Reason) Flowsheets (Taken 01/30/2023 1631) LTG: Pt will perform toileting task (3/3 steps) with assistance level: (Not met due to increased assist needed for balance, coordination and cognition deficits) --

## 2023-01-30 NOTE — Progress Notes (Signed)
Occupational Therapy Session Note  Patient Details  Name: Cassandra [name] MRN: [id number] Date of Birth: 04-23-1933  Today's Date: 01/30/2023 OT Individual Time: 5284-1324 & 1105-1200 OT Individual Time Calculation (min): 40 min & 55 min   Short Term Goals: Week 2: OT Short Term Goal 1 (Week 2): STG = LTG due to ELOS  Skilled Therapeutic Interventions/Progress Updates:      Therapy Documentation Precautions:  Precautions Precautions: Fall Precaution Comments: monitor BP and O2 Restrictions Weight Bearing Restrictions: Yes LLE Weight Bearing: Non weight bearing  Session 1 General: "It's my day" Pt supine in bed upon OT arrival, agreeable to OT session.  Pain:   No pain reported/noted.  ADL:   Bed mobility: Mod I supine>EOB Footwear: SBA @ EOB to don tennis shoe  Transfers:  -EOB> W/C close supervision to right with transfer board and increased time, Min A for correct board placement VC for initiation and safety with transfer board placement Toilet transfers: W/C>< BSC over toilet Min A squat pivot with grab bars, VC for initiation and hand placement on grab bars Toileting: Min/mod A, assistance required for pants management, triceps push up on BSC for OT to complete pants management Grooming: hand hygiene and hair care Mod I  LB dressing: Mod I to don shrinker (L size) seated in W/C    Pt seated in W/C at end of session with W/C alarm donned, call light within reach and 4Ps assessed.   Session 2 General: "I am tired today" Pt seated in W/C asleep upon OT arrival, agreeable to OT. Pt fatigued during session. Nurse tech checked blood sugar during session.   Pain: 0/10 pain reported   ADL: Toilet transfer: Min A with use of grab bars and VC for hand placement and safety Toileting: Incontinent of void, however further continent of void on commode. Min/mod A needed; required for pants management, triceps push up on [facility] for OT to complete pants management  LB  dressing: Mod A for donning of new brief  Grooming: pt completed hand hygiene and hair care mod I seated in W/B at sink   Exercises: Pt completed the following exercise circuit with yellow theraband in order to improve functional activity, strength and endurance to prepare for ADLs such as bathing. Pt required increased time to complete. Pt completed the following exercises seated in W/C position with no noted LOB/SOB: -10X shoulder abduction -10X bicep curls BUE -10X triceps extension BUE -10X shoulder flexion BUE Pt required max VC/demonstration for exercises directions and correct body mechanics.    Family Education: Husband [name] present for end of session in order to provide education for tub shower transfer to prepare for DC. [name] completed shower transfer W/C>< tub bench Min A with VC for initiation. No safety concerns noted during transfer. Pt and husband educated on correct body mechanics during transfers in order to prevent injury. No concerns from pt or husband for home going.   Pt seated in W/C at end of session with W/C alarm donned, call light within reach and 4Ps assessed. Husband [name] present.  Therapy/Group: Individual Therapy  Melvyn Novas, MS, OTR/L  01/30/2023, 12:30 PM

## 2023-01-31 ENCOUNTER — Telehealth: Payer: Self-pay

## 2023-01-31 ENCOUNTER — Other Ambulatory Visit (HOSPITAL_COMMUNITY): Payer: Self-pay

## 2023-01-31 DIAGNOSIS — E1042 Type 1 diabetes mellitus with diabetic polyneuropathy: Secondary | ICD-10-CM

## 2023-01-31 LAB — GLUCOSE, CAPILLARY: Glucose-Capillary: 90 mg/dL (ref 70–99)

## 2023-01-31 MED ORDER — QUTENZA (2 PATCH) 8 % EX KIT
2.0000 | PACK | Freq: Once | CUTANEOUS | 0 refills | Status: AC
Start: 2023-01-31 — End: 2023-01-31
  Filled 2023-01-31: qty 2, 1d supply, fill #0

## 2023-01-31 MED ORDER — VITAMIN D (ERGOCALCIFEROL) 1.25 MG (50000 UNIT) PO CAPS: 50000.0000 [IU] | ORAL_CAPSULE | ORAL | 0 refills | Status: DC

## 2023-01-31 NOTE — Progress Notes (Signed)
Inpatient Rehabilitation Discharge Medication Review by a Pharmacist  A complete drug regimen review was completed for this patient to identify any potential clinically significant medication issues.  High Risk Drug Classes Is patient taking? Indication by Medication  Antipsychotic No   Anticoagulant Yes Apixaban- AF  Antibiotic No   Opioid Yes Tramadol- acute pain  Antiplatelet Yes Plavix- CAD- stent  Hypoglycemics/insulin Yes Metformin- T2DM (eGFR 47)  Vasoactive Medication Yes Demadex, lopressor, nitroSL- HTN, angina  Chemotherapy No   Other Yes Robaxin- muscle spasms Lyrica- phantom pain Pristiq- phantom pain Atarax- itching Synthroid- hypothyroidism Melatonin- sleep Crestor- HLD Gabapentin- phantom pain Colace-constipation APAP-pain MVI, KCl, vitD-vitamins/supplements     Type of Medication Issue Identified Description of Issue Recommendation(s)  Drug Interaction(s) (clinically significant)     Duplicate Therapy     Allergy     No Medication Administration End Date     Incorrect Dose     Additional Drug Therapy Needed     Significant med changes from prior encounter (inform family/care partners about these prior to discharge). Reduced apixaban dose New: Lyrica, robaxin, colace Discontinue: Telmisartan, omadacycline, amitriptylline, trazodone, Lantus Communicate changes to patient and family as appropriate  Other       Clinically significant medication issues were identified that warrant physician communication and completion of prescribed/recommended actions by midnight of the next day:  No  Time spent performing this drug regimen review (minutes):  30   Thank you for allowing pharmacy to be a part of this patient's care.   Signe Colt, PharmD 01/31/2023 8:07 AM  **Pharmacist phone directory can be found on [email or web address] listed under [facility]**

## 2023-01-31 NOTE — Telephone Encounter (Signed)
Patches sent to specialty pharmacy  

## 2023-01-31 NOTE — Progress Notes (Addendum)
Patient ID: Cassandra [name], female   DOB: 04/04/1933, 89 y.o.   MRN: [id number]  Sw spoke with spouse to provide [facility] contact information. SW and spouse waiting on a follow up on transfer board before d/c. SW will continue to FU with [facility].   11:07 AM: [employer] bringing slide board before d/c.
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
## 2023-02-14 ENCOUNTER — Emergency Department
Admission: EM | Admit: 2023-02-14 | Discharge: 2023-02-14 | Disposition: A | Discharge status: Other Acute Inpatient Hospital | Payer: Medicare Other | Attending: Emergency Medicine | Admitting: Emergency Medicine

## 2023-02-14 ENCOUNTER — Emergency Department: Payer: Medicare Other

## 2023-02-14 ENCOUNTER — Encounter (HOSPITAL_COMMUNITY): Payer: Self-pay | Admitting: Internal Medicine

## 2023-02-14 ENCOUNTER — Observation Stay (HOSPITAL_COMMUNITY)
Admission: RE | Admit: 2023-02-14 | Discharge: 2023-02-15 | Disposition: A | Discharge status: Home / Self Care | Payer: Medicare Other | Source: Other Acute Inpatient Hospital | Attending: Internal Medicine | Admitting: Internal Medicine

## 2023-02-14 ENCOUNTER — Encounter (HOSPITAL_COMMUNITY): Payer: Self-pay

## 2023-02-14 ENCOUNTER — Inpatient Hospital Stay (HOSPITAL_COMMUNITY): Payer: Medicare Other

## 2023-02-14 ENCOUNTER — Other Ambulatory Visit: Payer: Self-pay

## 2023-02-14 DIAGNOSIS — Z7901 Long term (current) use of anticoagulants: Secondary | ICD-10-CM | POA: Insufficient documentation

## 2023-02-14 DIAGNOSIS — E1142 Type 2 diabetes mellitus with diabetic polyneuropathy: Secondary | ICD-10-CM | POA: Insufficient documentation

## 2023-02-14 DIAGNOSIS — R6520 Severe sepsis without septic shock: Secondary | ICD-10-CM

## 2023-02-14 DIAGNOSIS — I130 Hypertensive heart and chronic kidney disease with heart failure and stage 1 through stage 4 chronic kidney disease, or unspecified chronic kidney disease: Secondary | ICD-10-CM | POA: Insufficient documentation

## 2023-02-14 DIAGNOSIS — T148XXA Other injury of unspecified body region, initial encounter: Secondary | ICD-10-CM

## 2023-02-14 DIAGNOSIS — I480 Paroxysmal atrial fibrillation: Secondary | ICD-10-CM | POA: Diagnosis not present

## 2023-02-14 DIAGNOSIS — A419 Sepsis, unspecified organism: Secondary | ICD-10-CM | POA: Diagnosis not present

## 2023-02-14 DIAGNOSIS — I5042 Chronic combined systolic (congestive) and diastolic (congestive) heart failure: Secondary | ICD-10-CM

## 2023-02-14 DIAGNOSIS — Z87891 Personal history of nicotine dependence: Secondary | ICD-10-CM | POA: Diagnosis not present

## 2023-02-14 DIAGNOSIS — N183 Chronic kidney disease, stage 3 unspecified: Secondary | ICD-10-CM | POA: Diagnosis not present

## 2023-02-14 DIAGNOSIS — N1832 Chronic kidney disease, stage 3b: Secondary | ICD-10-CM

## 2023-02-14 DIAGNOSIS — Z7902 Long term (current) use of antithrombotics/antiplatelets: Secondary | ICD-10-CM | POA: Insufficient documentation

## 2023-02-14 DIAGNOSIS — Z950 Presence of cardiac pacemaker: Secondary | ICD-10-CM | POA: Insufficient documentation

## 2023-02-14 DIAGNOSIS — E1122 Type 2 diabetes mellitus with diabetic chronic kidney disease: Secondary | ICD-10-CM | POA: Diagnosis not present

## 2023-02-14 DIAGNOSIS — Z85828 Personal history of other malignant neoplasm of skin: Secondary | ICD-10-CM | POA: Insufficient documentation

## 2023-02-14 DIAGNOSIS — E875 Hyperkalemia: Secondary | ICD-10-CM | POA: Diagnosis not present

## 2023-02-14 DIAGNOSIS — J449 Chronic obstructive pulmonary disease, unspecified: Secondary | ICD-10-CM | POA: Insufficient documentation

## 2023-02-14 DIAGNOSIS — L089 Local infection of the skin and subcutaneous tissue, unspecified: Secondary | ICD-10-CM

## 2023-02-14 DIAGNOSIS — D638 Anemia in other chronic diseases classified elsewhere: Secondary | ICD-10-CM

## 2023-02-14 DIAGNOSIS — Z8709 Personal history of other diseases of the respiratory system: Secondary | ICD-10-CM

## 2023-02-14 DIAGNOSIS — E119 Type 2 diabetes mellitus without complications: Secondary | ICD-10-CM | POA: Diagnosis not present

## 2023-02-14 DIAGNOSIS — T8130XA Disruption of wound, unspecified, initial encounter: Secondary | ICD-10-CM | POA: Diagnosis not present

## 2023-02-14 DIAGNOSIS — T8132XA Disruption of internal operation (surgical) wound, not elsewhere classified, initial encounter: Secondary | ICD-10-CM | POA: Diagnosis present

## 2023-02-14 DIAGNOSIS — E782 Mixed hyperlipidemia: Secondary | ICD-10-CM

## 2023-02-14 DIAGNOSIS — I2510 Atherosclerotic heart disease of native coronary artery without angina pectoris: Secondary | ICD-10-CM | POA: Insufficient documentation

## 2023-02-14 DIAGNOSIS — E039 Hypothyroidism, unspecified: Secondary | ICD-10-CM

## 2023-02-14 DIAGNOSIS — E785 Hyperlipidemia, unspecified: Secondary | ICD-10-CM | POA: Diagnosis present

## 2023-02-14 DIAGNOSIS — I10 Essential (primary) hypertension: Secondary | ICD-10-CM

## 2023-02-14 DIAGNOSIS — Z79899 Other long term (current) drug therapy: Secondary | ICD-10-CM | POA: Diagnosis not present

## 2023-02-14 DIAGNOSIS — Z853 Personal history of malignant neoplasm of breast: Secondary | ICD-10-CM | POA: Diagnosis not present

## 2023-02-14 DIAGNOSIS — I129 Hypertensive chronic kidney disease with stage 1 through stage 4 chronic kidney disease, or unspecified chronic kidney disease: Secondary | ICD-10-CM | POA: Insufficient documentation

## 2023-02-14 DIAGNOSIS — Z955 Presence of coronary angioplasty implant and graft: Secondary | ICD-10-CM | POA: Diagnosis not present

## 2023-02-14 LAB — COMPREHENSIVE METABOLIC PANEL
ALT: 9 U/L (ref 0–44)
AST: 19 U/L (ref 15–41)
Albumin: 3.2 g/dL — ABNORMAL LOW (ref 3.5–5.0)
Alkaline Phosphatase: 74 U/L (ref 38–126)
Anion gap: 14 (ref 5–15)
BUN: 23 mg/dL (ref 8–23)
CO2: 20 mmol/L — ABNORMAL LOW (ref 22–32)
Calcium: 9.3 mg/dL (ref 8.9–10.3)
Chloride: 108 mmol/L (ref 98–111)
Creatinine, Ser: 1.32 mg/dL — ABNORMAL HIGH (ref 0.44–1.00)
GFR, Estimated: 39 mL/min — ABNORMAL LOW (ref >60)
Glucose, Bld: 110 mg/dL — ABNORMAL HIGH (ref 70–99)
Potassium: 5.3 mmol/L — ABNORMAL HIGH (ref 3.5–5.1)
Sodium: 142 mmol/L (ref 135–145)
Total Bilirubin: 0.2 mg/dL — ABNORMAL LOW (ref 0.3–1.2)
Total Protein: 7.8 g/dL (ref 6.5–8.1)

## 2023-02-14 LAB — LACTIC ACID, PLASMA
Lactic Acid, Venous: 3.5 mmol/L (ref 0.5–1.9)
Lactic Acid, Venous: 2.7 mmol/L (ref 0.5–1.9)

## 2023-02-14 LAB — CBC WITH DIFFERENTIAL/PLATELET
Abs Immature Granulocytes: 0.09 10*3/uL — ABNORMAL HIGH (ref 0.00–0.07)
Basophils Absolute: 0.2 10*3/uL — ABNORMAL HIGH (ref 0.0–0.1)
Basophils Relative: 1 %
Eosinophils Absolute: 0.5 10*3/uL (ref 0.0–0.5)
Eosinophils Relative: 4 %
HCT: 34.8 % — ABNORMAL LOW (ref 36.0–46.0)
Hemoglobin: 10.3 g/dL — ABNORMAL LOW (ref 12.0–15.0)
Immature Granulocytes: 1 %
Lymphocytes Relative: 20 %
Lymphs Abs: 2.5 10*3/uL (ref 0.7–4.0)
MCH: 26.3 pg (ref 26.0–34.0)
MCHC: 29.6 g/dL — ABNORMAL LOW (ref 30.0–36.0)
MCV: 88.8 fL (ref 80.0–100.0)
Monocytes Absolute: 1 10*3/uL (ref 0.1–1.0)
Monocytes Relative: 8 %
Neutro Abs: 8.3 10*3/uL — ABNORMAL HIGH (ref 1.7–7.7)
Neutrophils Relative %: 66 %
Platelets: 414 10*3/uL — ABNORMAL HIGH (ref 150–400)
RBC: 3.92 MIL/uL (ref 3.87–5.11)
RDW: 15.6 % — ABNORMAL HIGH (ref 11.5–15.5)
WBC: 12.5 10*3/uL — ABNORMAL HIGH (ref 4.0–10.5)
nRBC: 0 % (ref 0.0–0.2)

## 2023-02-14 LAB — TYPE AND SCREEN
ABO/RH(D): A POS
Antibody Screen: NEGATIVE

## 2023-02-14 LAB — GLUCOSE, CAPILLARY: Glucose-Capillary: 90 mg/dL (ref 70–99)

## 2023-02-14 MED ORDER — LACTATED RINGERS IV BOLUS (SEPSIS)
250.0000 mL | Freq: Once | INTRAVENOUS | Status: AC
  Administered 2023-02-14: 250 mL via INTRAVENOUS

## 2023-02-14 MED ORDER — ACETAMINOPHEN 325 MG PO TABS: 650.0000 mg | ORAL_TABLET | Freq: Four times a day (QID) | ORAL | Status: DC | PRN

## 2023-02-14 MED ORDER — METOPROLOL TARTRATE 25 MG PO TABS
25.0000 mg | ORAL_TABLET | Freq: Two times a day (BID) | ORAL | Status: DC
  Administered 2023-02-14 – 2023-02-15 (×2): 25 mg via ORAL
  Filled 2023-02-14 (×2): qty 1

## 2023-02-14 MED ORDER — LACTATED RINGERS IV BOLUS (SEPSIS)
500.0000 mL | Freq: Once | INTRAVENOUS | Status: AC
  Administered 2023-02-14: 500 mL via INTRAVENOUS

## 2023-02-14 MED ORDER — VANCOMYCIN HCL IN DEXTROSE 1-5 GM/200ML-% IV SOLN
1000.0000 mg | Freq: Once | INTRAVENOUS | Status: AC
  Administered 2023-02-14: 1000 mg via INTRAVENOUS
  Filled 2023-02-14: qty 200

## 2023-02-14 MED ORDER — INSULIN ASPART 100 UNIT/ML IJ SOLN: 0.0000 [IU] | Freq: Four times a day (QID) | INTRAMUSCULAR | Status: DC

## 2023-02-14 MED ORDER — ALBUTEROL SULFATE (2.5 MG/3ML) 0.083% IN NEBU: 2.5000 mg | INHALATION_SOLUTION | RESPIRATORY_TRACT | Status: DC | PRN

## 2023-02-14 MED ORDER — FENTANYL CITRATE PF 50 MCG/ML IJ SOSY
25.0000 ug | PREFILLED_SYRINGE | INTRAMUSCULAR | Status: DC | PRN
  Administered 2023-02-15: 25 ug via INTRAVENOUS
  Filled 2023-02-14: qty 1

## 2023-02-14 MED ORDER — SODIUM CHLORIDE 0.9 % IV SOLN
2.0000 g | INTRAVENOUS | Status: DC
  Administered 2023-02-14: 2 g via INTRAVENOUS
  Filled 2023-02-14: qty 12.5

## 2023-02-14 MED ORDER — ROSUVASTATIN CALCIUM 5 MG PO TABS
10.0000 mg | ORAL_TABLET | Freq: Every day | ORAL | Status: DC
  Administered 2023-02-14: 10 mg via ORAL
  Filled 2023-02-14: qty 2

## 2023-02-14 MED ORDER — LACTATED RINGERS IV BOLUS (SEPSIS)
1000.0000 mL | Freq: Once | INTRAVENOUS | Status: AC
  Administered 2023-02-14: 1000 mL via INTRAVENOUS

## 2023-02-14 MED ORDER — VANCOMYCIN HCL 750 MG/150ML IV SOLN: 750.0000 mg | INTRAVENOUS | Status: DC

## 2023-02-14 MED ORDER — ACETAMINOPHEN 650 MG RE SUPP: 650.0000 mg | Freq: Four times a day (QID) | RECTAL | Status: DC | PRN

## 2023-02-14 MED ORDER — VENLAFAXINE HCL ER 75 MG PO CP24
75.0000 mg | ORAL_CAPSULE | Freq: Every day | ORAL | Status: DC
  Administered 2023-02-14: 75 mg via ORAL
  Filled 2023-02-14: qty 1

## 2023-02-14 MED ORDER — METRONIDAZOLE 500 MG/100ML IV SOLN
500.0000 mg | Freq: Two times a day (BID) | INTRAVENOUS | Status: DC
  Administered 2023-02-14: 500 mg via INTRAVENOUS
  Filled 2023-02-14: qty 100

## 2023-02-14 MED ORDER — LACTATED RINGERS IV SOLN: INTRAVENOUS | Status: DC

## 2023-02-14 MED ORDER — MELATONIN 3 MG PO TABS: 3.0000 mg | ORAL_TABLET | Freq: Every evening | ORAL | Status: DC | PRN

## 2023-02-14 MED ORDER — NALOXONE HCL 0.4 MG/ML IJ SOLN: 0.4000 mg | INTRAMUSCULAR | Status: DC | PRN

## 2023-02-14 MED ORDER — PREGABALIN 25 MG PO CAPS
25.0000 mg | ORAL_CAPSULE | Freq: Every day | ORAL | Status: DC
  Administered 2023-02-14: 25 mg via ORAL
  Filled 2023-02-14: qty 1

## 2023-02-14 MED ORDER — PIPERACILLIN-TAZOBACTAM 3.375 G IVPB 30 MIN
3.3750 g | Freq: Once | INTRAVENOUS | Status: AC
  Administered 2023-02-14: 3.375 g via INTRAVENOUS
  Filled 2023-02-14: qty 50

## 2023-02-14 MED ORDER — ONDANSETRON HCL 4 MG/2ML IJ SOLN: 4.0000 mg | Freq: Four times a day (QID) | INTRAMUSCULAR | Status: DC | PRN

## 2023-02-14 NOTE — ED Notes (Signed)
Called [facility] called for possible transport. Pt is on the list for both [facility] and [facility]

## 2023-02-14 NOTE — H&P (Signed)
History and Physical      Cassandra [name]:[id number] DOB: 13-Mar-1933 DOA: 02/14/2023; DOS: 02/14/2023  PCP: [name], MD  Patient coming from: home   I have personally briefly reviewed patient's old medical records in [facility]  Chief Complaint: pain at left bka site.  HPI: Cassandra [name] is a 89 y.o. female with medical history significant for type 2 diabetes mellitus, paroxysmal atrial fibrillation complicated by sick sinus syndrome status post pacemaker placement in 2017, chronically anticoagulated on Eliquis, CKD 3B associated baseline creatinine range 1.8-1.7, chronic systolic/diastolic heart failure as well as chronic right-sided systolic heart failure, anemia of chronic disease associated baseline hemoglobin 8-11, who is admitted to [facility] on 02/14/2023 by way of transfer from [facility] ED with severe sepsis due to concern for infected surgical wound involving left BKA, complicated by wound dehiscence,  after presenting from home to the latter facility complaining of pain at the left BKA site .   The patient had undergone left below the knee potation on 01/03/2023 with Dr. Jena Gauss of Orthopedic surgery.  She conveys that 3 days ago, a large scab associated with the left BKA site fell off while attempting to change some clothing that came to contact with this region.  Since that time, she has noted progressive left BKA stump erythema, swelling, tenderness, and subsequent development of serous drainage from the site over the last 1 to 2 days.  She denies any overt purulent drainage associate with this.  She denies any associated acute focal weakness, and or any acute focal numbness/paresthesias within the cavity at that she presents underlying diabetic peripheral polyneuropathy involving the bilateral lower extremities.  Denies any associated subjective fever, chills, rigors, or generalized myalgias.  Denies erythema in any other site.  No recent chest pain,  palpitations, diaphoresis, nausea, vomiting, dizziness, presyncope, or syncope.  She also denies any recent shortness of breath, orthopnea, or PND.  No recent cough, dysuria, gross hematuria.  In the setting of a history of paroxysmal atrial fibrillation, she is chronically anticoagulated on Eliquis.  Additionally, she is on Plavix as an outpatient in the absence of any aspirin.  Her outpatient medications are also notable for scheduled potassium chloride 20 mill colons p.o. daily, which she takes in the setting of daily torsemide use.     [facility] ED Course:  Vital signs in the ED were notable for the following: Afebrile; heart rates in the range of 67 and 97; initial blood pressure 108/40, which increased to 157/90 following interval IV fluids, as further quantified below; respiratory rate 16, oxygen saturation 95 to 100% on room air.  Labs were notable for the following: CMP notable for the following: Sodium 142, potassium 5.3, bicarbonate 20, anion gap 14, creatinine 1.32, glucose 110, calcium, adjusted for mild hypoalbuminemia noted to be 9.9, albumin 3.2, otherwise, liver enzymes within normal limits.  CBC notable for white cell count 12,900, hemoglobin 10.3 associated normocytic property.  Initial lactic acid 3.5, with repeat trending down to 2.7.  Blood cultures x 2 were collected prior to initiation of IV antibiotics.  Imaging and additional notable ED work-up: Plain films of the left knee, per formal radiology read showed status post interval BKA without acute osseous abnormality, including no overt evidence of underlying osteomyelitis.  [facility] ED d/w Dr. Blanchie Dessert, who is on-call for Dr. Jena Gauss at [facility]. Dr. Blanchie Dessert recommended TRH admit to [facility] and conveyed the ortho will formally consult/see patient in the AM. He was amenable to interval  broad spectrum abx in the interval.   While in the ED, the following were administered: IV vancomycin, Zosyn, lactated Ringer's x 1750 cc  bolus.  Subsequently, the patient was admitted for further evaluation management of severe sepsis due to concern for infected surgical wound involving left BKA, complicated by wound dehiscence, with presenting labs notable for mild hyperkalemia.    Review of Systems: As per HPI otherwise 10 point review of systems negative.   Past Medical History:  Diagnosis Date   Acquired hypothyroidism 01/06/2014   Anxiety    Arthritis    B12 deficiency 02/15/2014   Benign essential hypertension 01/06/2014   Breast cancer (HCC) 1982   Right breast cancer - chemotherapy   CAD (coronary artery disease), native coronary artery 01/06/2014   Carotid artery calcification    Chronic airway obstruction, not elsewhere classified 01/06/2014   Chronic mesenteric ischemia (HCC)    s/p SMA stent 11/03/19   Depression    Diabetes mellitus without complication (HCC)    Dysrhythmia    aflutter with RVR 06/2016   History of kidney stones 2019   left ureteral stone   Incontinence of urine    Myocardial infarction (HCC) 1982   small infarct   Neuromuscular disorder (HCC)    restless legs   Pacemaker    Presence of permanent cardiac pacemaker 07/2016   Pure hypercholesterolemia 01/06/2014   Renal insufficiency    Skin cancer    Sleep apnea    no longer uses a cpap. Uses oxygen as needed    Past Surgical History:  Procedure Laterality Date   AMPUTATION Left 01/03/2023   Procedure: AMPUTATION BELOW KNEE;  Surgeon: Roby Lofts, MD;  Location: [facility] OR;  Service: Orthopedics;  Laterality: Left;   ANKLE FUSION Left 10/10/2021   Procedure: ANKLE FUSION;  Surgeon: Roby Lofts, MD;  Location: [facility] OR;  Service: Orthopedics;  Laterality: Left;   APPENDECTOMY     APPLICATION OF WOUND VAC Left 02/24/2021   Procedure: APPLICATION OF WOUND VAC;  Surgeon: Samson Frederic, MD;  Location: [facility] OR;  Service: Orthopedics;  Laterality: Left;   AUGMENTATION MAMMAPLASTY Bilateral 1982/redo in 2014   prior mastectomy    CARDIAC CATHETERIZATION N/A 04/23/2016   Procedure: Left Heart Cath and Coronary Angiography;  Surgeon: Lamar Blinks, MD;  Location: [facility] INVASIVE CV LAB;  Service: Cardiovascular;  Laterality: N/A;   CARDIAC CATHETERIZATION Left 04/23/2016   Procedure: Coronary Stent Intervention;  Surgeon: Alwyn Pea, MD;  Location: [facility] INVASIVE CV LAB;  Service: Cardiovascular;  Laterality: Left;   CAROTID ARTERY ANGIOPLASTY Left 2010   had stent inserted and removed d/t infection. artery from leg inserted in left carotid   CAROTID ENDARTERECTOMY Left 2010   left CEA ~ 2010, s/p excision infected left carotid patch & pseudoaneurysm with left CCA-ICA bypass using SVG 05/20/12   CHOLECYSTECTOMY     CORONARY ANGIOPLASTY WITH STENT PLACEMENT  september 9th 2017   CYSTOSCOPY W/ URETERAL STENT PLACEMENT Left 08/17/2017   Procedure: CYSTOSCOPY WITH RETROGRADE PYELOGRAM/URETERAL STENT PLACEMENT;  Surgeon: Jerilee Field, MD;  Location: [facility] ORS;  Service: Urology;  Laterality: Left;   CYSTOSCOPY W/ URETERAL STENT PLACEMENT Left 09/16/2017   Procedure: CYSTOSCOPY WITH STENT REPLACEMENT;  Surgeon: [name], MD;  Location: [facility] ORS;  Service: Urology;  Laterality: Left;   CYSTOSCOPY/RETROGRADE/URETEROSCOPY/STONE EXTRACTION WITH BASKET Left 09/16/2017   Procedure: CYSTOSCOPY/RETROGRADE/URETEROSCOPY/STONE EXTRACTION WITH BASKET;  Surgeon: [name], MD;  Location: [facility] ORS;  Service: Urology;  Laterality: Left;  EXTERNAL FIXATION LEG Left 02/24/2021   Procedure: EXTERNAL FIXATION  ANKLE;  Surgeon: Samson Frederic, MD;  [facility]: [facility];  Service: Orthopedics;  Laterality: Left;   EXTERNAL FIXATION REMOVAL Left 02/26/2021   Procedure: REMOVAL EXTERNAL FIXATION LEG;  Surgeon: Roby Lofts, MD;  Location: [facility];  Service: Orthopedics;  Laterality: Left;   EYE SURGERY Bilateral    cataract extraction   I & D EXTREMITY Left 02/24/2021   Procedure: IRRIGATION AND DEBRIDEMENT EXTREMITY;   Surgeon: Samson Frederic, MD;  [facility]: [facility];  Service: Orthopedics;  Laterality: Left;   I & D EXTREMITY Left 12/21/2021   Procedure: IRRIGATION AND DEBRIDEMENT LEFT ANKLE;  Surgeon: Roby Lofts, MD;  [facility]: [facility];  Service: Orthopedics;  Laterality: Left;   I & D EXTREMITY Left 05/03/2022   Procedure: DEBRIDEMENT OF LEFT ANKLE INFECTION;  Surgeon: Roby Lofts, MD;  [facility]: [facility];  Service: Orthopedics;  Laterality: Left;   IR FLUORO GUIDE CV LINE LEFT  12/27/2021   IR FLUORO GUIDE CV LINE RIGHT  10/12/2021   IR REMOVAL TUN CV CATH W/O FL  11/22/2021   IR REMOVAL TUN CV CATH W/O FL  10/10/2022   IR US GUIDE VASC ACCESS LEFT  12/27/2021   IR US GUIDE VASC ACCESS RIGHT  10/12/2021   KYPHOPLASTY N/A 02/03/2018   Procedure: [id number];  Surgeon: Kennedy Bucker, MD;  Location: [facility] ORS;  Service: Orthopedics;  Laterality: N/A;   MASTECTOMY Right 1982   ORIF ANKLE FRACTURE Left 02/26/2021   Procedure: OPEN REDUCTION INTERNAL FIXATION (ORIF) ANKLE FRACTURE;  Surgeon: Roby Lofts, MD;  [facility]: [facility];  Service: Orthopedics;  Laterality: Left;   PACEMAKER INSERTION Left 07/03/2016   Procedure: INSERTION PACEMAKER;  Surgeon: Marcina Millard, MD;  Location: [facility] ORS;  Service: Cardiovascular;  Laterality: Left;   SKIN CANCER EXCISION     TONSILLECTOMY     TUBAL LIGATION     VISCERAL ANGIOGRAPHY N/A 11/03/2019   Procedure: VISCERAL ANGIOGRAPHY;  Surgeon: Renford Dills, MD;  Location: [facility] INVASIVE CV LAB;  Service: Cardiovascular;  Laterality: N/A;    Social History:  reports that she quit smoking about 68 years ago. Her smoking use included cigarettes. She started smoking about 69 years ago. She smoked an average of .5 packs per day. She has been exposed to tobacco smoke. She has never used smokeless tobacco. She reports current alcohol use. She reports that she does not currently use drugs.   Allergies  Allergen Reactions   Baclofen Other (See Comments)    Hallucinations    Levofloxacin Other (See Comments)    Hallucinations     Family History  Problem Relation Age of Onset   Prostate cancer Neg Hx    Kidney cancer Neg Hx    Breast cancer Neg Hx     Family history reviewed and not pertinent    Prior to Admission medications   Medication Sig Start Date End Date Taking? Authorizing Provider  apixaban (ELIQUIS) 2.5 MG TABS tablet Take 1 tablet (2.5 mg total) by mouth 2 (two) times daily. 01/28/23  Yes Angiulli, Mcarthur Rossetti, PA-C  levothyroxine (SYNTHROID) 112 MCG tablet Take 112 mcg by mouth daily before breakfast.   Yes [provider]  acetaminophen (TYLENOL) 325 MG tablet Take 1-2 tablets (325-650 mg total) by mouth every 6 (six) hours as needed for mild pain (pain score 1-3 or temp > 100.5). 01/27/23   Angiulli, Mcarthur Rossetti, PA-C  capsaicin topical system (QUTENZA, 4 PATCH,) 8 %  Apply 2 patches topically once. Placed on affected area of skin every 90 days as needed for diabetic neuropathy 03/14/23 09/02/23  Raulkar, Drema Pry, MD  clopidogrel (PLAVIX) 75 MG tablet Take 1 tablet (75 mg total) by mouth daily. 01/28/23   Angiulli, Mcarthur Rossetti, PA-C  desvenlafaxine (PRISTIQ) 50 MG 24 hr tablet Take 100 mg by mouth every evening.    [provider]  docusate sodium (COLACE) 100 MG capsule Take 1 capsule (100 mg total) by mouth 2 (two) times daily. 01/28/23   Angiulli, Mcarthur Rossetti, PA-C  gabapentin (NEURONTIN) 100 MG capsule Take 1 capsule (100 mg total) by mouth 2 (two) times daily as needed (severe phantom limb pain). 01/30/23   Angiulli, Mcarthur Rossetti, PA-C  hydrOXYzine (ATARAX) 10 MG tablet Take 1 tablet (10 mg total) by mouth 3 (three) times daily as needed. 10/03/22   Kuppelweiser, Cassie L, RPH-CPP  Melatonin 10 MG CAPS Take 10 mg by mouth at bedtime as needed (sleep).    [provider]  metFORMIN (GLUCOPHAGE) 500 MG tablet Take 1 tablet (500 mg total) by mouth 2 (two) times daily with a meal. 01/28/23   Angiulli, Mcarthur Rossetti, PA-C  methocarbamol  (ROBAXIN) 500 MG tablet Take 1 tablet (500 mg total) by mouth every 6 (six) hours as needed for muscle spasms. 01/28/23   Angiulli, Mcarthur Rossetti, PA-C  metoprolol tartrate (LOPRESSOR) 25 MG tablet Take 1 tablet (25 mg total) by mouth 2 (two) times daily. 08/10/22 12/30/22  Narda Bonds, MD  Multiple Vitamin (MULTIVITAMIN WITH MINERALS) TABS tablet Take 1 tablet by mouth daily. 09/24/17   Delfino Lovett, MD  nitroGLYCERIN (NITROSTAT) 0.4 MG SL tablet Place 1 tablet (0.4 mg total) under the tongue every 5 (five) minutes as needed for chest pain. 04/24/16   Adrian Saran, MD  potassium chloride (KLOR-CON M) 10 MEQ tablet Take 10 mEq by mouth 2 (two) times daily.    [provider]  pregabalin (LYRICA) 25 MG capsule Take 1 capsule (25 mg total) by mouth at bedtime. 01/28/23   Angiulli, Mcarthur Rossetti, PA-C  rosuvastatin (CRESTOR) 10 MG tablet Take 10 mg by mouth at bedtime. 09/04/22   [provider]  torsemide (DEMADEX) 20 MG tablet Take 1 tablet (20 mg total) by mouth daily. 01/30/23   Angiulli, Mcarthur Rossetti, PA-C  traMADol (ULTRAM) 50 MG tablet Take 1 tablet (50 mg total) by mouth every 6 (six) hours as needed for moderate pain or severe pain. 01/28/23   Angiulli, Mcarthur Rossetti, PA-C  VITAMIN D PO Take 1 capsule by mouth daily.    [provider]  Vitamin D, Ergocalciferol, (DRISDOL) 1.25 MG (50000 UNIT) CAPS capsule Take 1 capsule (50,000 Units total) by mouth every 7 (seven) days. Once a week on Thursdays 02/06/23   Jacquelynn Cree, PA-C     Objective    Physical Exam: There were no vitals filed for this visit.  General: appears to be stated age; alert, oriented Skin: warm, will left BKA site associated with mild erythema, swelling, but in the absence of any crepitus Head:  AT/[place] Mouth:  Oral mucosa membranes appear dry, normal dentition Neck: supple; trachea midline Heart:  RRR; did not appreciate any M/R/G Lungs: CTAB, did not appreciate any wheezes, rales, or rhonchi Abdomen: + BS; soft, ND,  NT Extremities: no muscle wasting; left BKA noted and associated mild erythema, swelling, as further detailed above Neuro: strength and sensation intact in upper and lower extremities b/l    Labs on Admission:  I have personally reviewed following labs and imaging studies  CBC: Recent Labs  Lab 02/14/23 1559  WBC 12.5*  NEUTROABS 8.3*  HGB 10.3*  HCT 34.8*  MCV 88.8  PLT 414*   Basic Metabolic Panel: Recent Labs  Lab 02/14/23 1559  NA 142  K 5.3*  CL 108  CO2 20*  GLUCOSE 110*  BUN 23  CREATININE 1.32*  CALCIUM 9.3   GFR: Estimated Creatinine Clearance: 21.9 mL/min (A) (by C-G formula based on SCr of 1.32 mg/dL (H)). Liver Function Tests: Recent Labs  Lab 02/14/23 1559  AST 19  ALT 9  ALKPHOS 74  BILITOT 0.2*  PROT 7.8  ALBUMIN 3.2*   No results for input(s): "LIPASE", "AMYLASE" in the last 168 hours. No results for input(s): "AMMONIA" in the last 168 hours. Coagulation Profile: No results for input(s): "INR", "PROTIME" in the last 168 hours. Cardiac Enzymes: No results for input(s): "CKTOTAL", "CKMB", "CKMBINDEX", "TROPONINI" in the last 168 hours. BNP (last 3 results) No results for input(s): "PROBNP" in the last 8760 hours. HbA1C: No results for input(s): "HGBA1C" in the last 72 hours. CBG: No results for input(s): "GLUCAP" in the last 168 hours. Lipid Profile: No results for input(s): "CHOL", "HDL", "LDLCALC", "TRIG", "CHOLHDL", "LDLDIRECT" in the last 72 hours. Thyroid Function Tests: No results for input(s): "TSH", "T4TOTAL", "FREET4", "T3FREE", "THYROIDAB" in the last 72 hours. Anemia Panel: No results for input(s): "VITAMINB12", "FOLATE", "FERRITIN", "TIBC", "IRON", "RETICCTPCT" in the last 72 hours. Urine analysis:    Component Value Date/Time   COLORURINE YELLOW 01/22/2023 0938   APPEARANCEUR CLEAR 01/22/2023 0938   APPEARANCEUR Clear 10/27/2017 1508   LABSPEC 1.013 01/22/2023 0938   LABSPEC 1.008 05/11/2012 1400   PHURINE 5.0 01/22/2023  0938   GLUCOSEU NEGATIVE 01/22/2023 0938   GLUCOSEU Negative 05/11/2012 1400   HGBUR NEGATIVE 01/22/2023 0938   BILIRUBINUR NEGATIVE 01/22/2023 0938   BILIRUBINUR Negative 10/27/2017 1508   BILIRUBINUR Negative 05/11/2012 1400   KETONESUR NEGATIVE 01/22/2023 0938   PROTEINUR NEGATIVE 01/22/2023 0938   NITRITE NEGATIVE 01/22/2023 0938   LEUKOCYTESUR NEGATIVE 01/22/2023 0938   LEUKOCYTESUR Trace 05/11/2012 1400    Radiological Exams on Admission: DG Knee 2 Views Left  Result Date: 02/14/2023 CLINICAL DATA:  Infection EXAM: LEFT KNEE - 1-2 VIEW COMPARISON:  CT of the left tibia and fibula 09/20/2022 FINDINGS: Patient is status post interval below-the-knee amputation. Osseous surgical margin appears within normal limits. There is overlying soft tissue swelling air. Peripheral vascular calcifications are present. No acute fracture or dislocation. There are moderate degenerative changes of the lateral compartment of the knee. IMPRESSION: Status post interval below-the-knee amputation. Osseous surgical margin appears within normal limits. Electronically Signed   By: Darliss Cheney M.D.   On: 02/14/2023 16:36      Assessment/Plan   Principal Problem:   Wound dehiscence Active Problems:   Chronic combined systolic and diastolic heart failure (HCC)   Acquired hypothyroidism   DM2 (diabetes mellitus, type 2) (HCC)   Essential hypertension   Severe sepsis (HCC)   HLD (hyperlipidemia)   Hyperkalemia   History of COPD   Paroxysmal atrial fibrillation (HCC)   CKD stage 3b, GFR 30-44 ml/min (HCC)   Anemia of chronic disease      #) severe sepsis due to concern for infected surgical wound involving left BKA, complicated by wound dehiscence: The context of recent left BKA performed by Dr. Jena Gauss  of Orthopedic surgery on 01/03/2023, patient presents with 3 days of progressive left BKA stump  erythema, swelling, serous drainage, after traumatic disruption of associated scab while attempting to  remove an article including 3 to 4 days ago.  Plain films without any evidence of osteomyelitis, and or any evidence of subcutaneous gas to suggest necrotizing fasciitis.  SIRS criteria met via presenting leukocytosis, tachycardia. Lactic acid level: Elevated initially at 3.5, therapy value trending down slightly to 2.7. Of note, given the associated presence of suspected end organ damage in the form of concominant presenting elevated lactate, criteria are met for pt's sepsis to be considered severe in nature. However, in the absence of lactic acid level that is greater than or equal to 4.0, and in the absence of any associated hypotension refractory to IVF's, there are no indications for administration of a 30 mL/kg IVF bolus at this time.   Additional ED work-up/management notable for: Blood cultures x 2 were collected followed by initiation of IV vancomycin and Zosyn.  Given increased risk for underlying osteomyelitis given associated wound dehiscence in the setting of recent left BKA, will continue empiric MRSA coverage as well as antipseudomonal coverage.  Additionally, as the patient possesses a wound in the setting of a history of diabetes, also provide continuation of anaerobic coverage.  No e/o additional infectious process at this time, although will check urinalysis and chest x-ray to further assess.  [facility] ED d/w Dr. Blanchie Dessert, who is on-call for Dr. Jena Gauss at [facility]. Dr. Blanchie Dessert recommended TRH admit to [facility] and conveyed that ortho will formally consult/see patient in the AM. He was amenable to interval broad spectrum abx in the interval. Patient made NPO at [facility].  Chales Abrahams Score for this patient in the context of potential ensuing surgical intervation conveys a  0.96% perioperative risk for significant cardiac event. No evidence to suggest acutely decompensated heart failure or acute MI. Consequently, no absolute contraindications to proceeding with proposed surgery at this time.   Plan: CBC w/  diff and CMP in AM.  Follow for results of blood cx's x 2. Abx: IV vancomycin, cefepime, IV Flagyl, as above.  Continues lactated Ringer's at 100 cc/h.  Repeat lactic acid level.  Check CRP, ESR, prn IV fentanyl.  Check preoperative EKG.  Check INR.  Type and screen ordered.  Orthopedic surgery consulted, as above.  Check urinalysis, chest x-ray.  N.p.o. at midnight.  Hold home Eliquis, Plavix for now.                 #) Hyperkalemia: Mildly elevated presenting serum potassium level of 5.3, likely type factorial in etiology, including contributions from her outpatient scheduled potassium supplementation as well as likely extracellular potassium shift as consequence of presenting lactic acidosis in the context of presenting severe sepsis.  Will continue to focus on treatment of underlying sepsis to promote intracellular shift while also holding home potassium supplementation, as further detailed below.  Plan: Check EKG.  Monitor on telemetry.  IV fluids, as above.  Hold home scheduled potassium supplementation for further evaluation management presents for sepsis, as above.  CMP in the morning.  Check serum magnesium level.                 #) Type 2 Diabetes Mellitus: documented history of such. Home insulin regimen: None. Home oral hypoglycemic agents: Met Forman. presenting blood sugar: 110. Most recent A1c noted to be 6.2% when checked on 01/15/2023.  Plan: In the setting of impending n.p.o. status, will pursue Accu-Cheks every 6 hours. With associated low-dose sliding scale insulin.  Hold home metformin during  this hospitalization.              #) Essential Hypertension: documented h/o such, with outpatient antihypertensive regimen including metoprolol to tartrate.  SBP's in the ED today: Low 100s to 150s mmHg.   Plan: Close monitoring of subsequent BP via routine VS. in the context of impending n.p.o. status, will hold home beta-blocker, plan for postoperative  resumption there for associated mortality benefit involving resumption of beta-blocker in the postoperative period.                    #) Hyperlipidemia: documented h/o such. On rosuvastatin as outpatient.   Plan: Holding home rosuvastatin in the setting of n.p.o. status.                 #) acquired hypothyroidism: documented h/o such, on Synthroid as outpatient.   Plan: Holding home Synthroid for now in the setting of current n.p.o. status.                  #) COPD: Documented history thereof, without clinical evidence of acute exacerbation at this time.  She conveys that she is a former smoker.  Plan:  Prn albuterol nebulizer. Check CMP and serum magnesium level in the AM.                      #) Chronic combined systolic and diastolic heart failure: documented history of such, as well as a documented history of chronic right-sided systolic heart failure, with most recent echocardiogram performed in December 2023 notable for LVEF 45 to 50%, grade 2 diastolic signs, mildly reduced right ventricular systolic function, moderately dilated bilateral atria, mild to moderate mitral regurgitation, moderate to severe tricuspid regurgitation..  No clinical evidence to suggest acutely decompensated heart failure at this time.   patient's home diuretic regimen reportedly consists of the following: Torsemide 20 mg p.o. daily, which we held for now in the context of presenting clinical evidence to suggest mild dehydration as well as subjective lactic acidosis, with associated plan for pursuit of additional gentle IV fluids, as outlined above.  Plan: monitor strict I's & O's and daily weights. Repeat BMP in the morning. Check serum magnesium level.  Hold home torsemide for now, as above.                 #) Paroxysmal atrial fibrillation: Documented history of such. In setting of CHA2DS2-VASc score of 9, there is an indication for  chronic anticoagulation for thromboembolic prophylaxis. Consistent with this, patient is chronically anticoagulated on Eliquis. Home AV nodal blocking regimen: Metoprolol tartrate.  Most recent echocardiogram was performed in December 2023, with results as further detailed above.  Plan: monitor strict I's & O's and daily weights. CMP/CBC in AM. Check serum mag level.  Hold home beta-blocker for now in the setting of impending n.p.o. status, as above.  Hold home Eliquis for now due to potential impending orthopedic surgical procedure, as above.  Monitor on symmetry.  Check EKG.             #) CKD Stage 3B: Documented history of such, with baseline creatinine 1.3-1.7, with presenting creatinine consistent with this baseline.    Plan: Monitor strict I's and O's and daily weights.  Attempt to avoid nephrotoxic agents.  CMP/magnesium level in the AM.               #) Anemia of chronic disease: Documented history of such, a/w with baseline hgb range  8-11, with presenting hgb consistent with this range, in the absence of any overt evidence of active bleed.     Plan: Repeat CBC in the morning.  Check INR.  In the context of potential ensuing orthopedic procedure, will also obtain type and screen at this time.      DVT prophylaxis: SCD on RLE  Code Status: Full code Family Communication: none Disposition Plan: Per Rounding Team Consults called: EDP at [facility] d/w patient's case/imaging with Dr. Blanchie Dessert, who conveyed that orthopedic surgery will formally consult, as further detailed above;  Admission status: Inpatient     I SPENT GREATER THAN 75  MINUTES IN CLINICAL CARE TIME/MEDICAL DECISION-MAKING IN COMPLETING THIS ADMISSION.      Chaney Born Chrysten Woulfe DO [facility]  From 7PM - 7AM   02/14/2023, 9:51 PM

## 2023-02-14 NOTE — ED Notes (Signed)
One set of blood cultures sent down 

## 2023-02-14 NOTE — ED Provider Notes (Signed)
[facility] Provider Note    Event Date/Time   First MD Initiated Contact with Patient 02/14/23 1610     (approximate)   History   Wound Dehiscence (Left BTK amputee)   HPI  Cassandra [name] is a 89 y.o. female with a history of DM, diabetic polyneuropathy, hypertension, hyperlipidemia, hypothyroidism, gout, breast cancer, COPD, sleep apnea, CAD, pacemaker, on Plavix and Eliquis, chronic mesenteric ischemia status post SMA stent, and CKD stage III who presents with concern for possible wound infection to a left below-knee amputation site.  The patient states that her at home rehab provider today noted that the wound was open and draining some fluid and thought it may be infected.  The patient states that a scab was pulled off 2 days ago when she took off his sock that had adhered to it.  She denies any significant increase in pain to the knee.  She has no fever or chills.  She denies any cough, shortness of breath, urinary symptoms, vomiting, or diarrhea.  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 Jena Gauss on 5/3.  She was discharged to inpatient rehab on 5/14.   Physical Exam   Triage Vital Signs: ED Triage Vitals  Enc Vitals Group     BP 02/14/23 1554 (!) 108/48     Pulse Rate 02/14/23 1554 67     Resp 02/14/23 1554 16     Temp 02/14/23 1554 98.3 F (36.8 C)     Temp Source 02/14/23 1554 Oral     SpO2 02/14/23 1554 95 %     Weight 02/14/23 1555 116 lb 13.5 oz (53 kg)     Height 02/14/23 1555 4\' 11"  (1.499 m)     Head Circumference --      Peak Flow --      Pain Score 02/14/23 1555 5     Pain Loc --      Pain Edu? --      Excl. in GC? --     Most recent vital signs: Vitals:   02/14/23 1931 02/14/23 2037  BP: (!) 158/84 (!) 152/56  Pulse: 88 63  Resp: 15   Temp: 98.3 F (36.8 C) (!) 97.3 F (36.3  C)  SpO2: 100% 97%     General: Awake, no distress.  CV:  Good peripheral perfusion.  Resp:  Normal effort.  Abd:  No distention.  Other:  Approximately 5x2 cm open wound to medial aspect of left knee surgical site with granulation tissue.  Faint surrounding erythema.  No abnormal warmth.  Clear fluid draining.  No purulence.   ED Results / Procedures / Treatments   Labs (all labs ordered are listed, but only abnormal results are displayed) Labs Reviewed  LACTIC ACID, PLASMA - Abnormal; Notable for the following components:      Result Value   Lactic Acid, Venous 3.5 (*)    All other components within normal limits  LACTIC ACID, PLASMA - Abnormal; Notable for the following components:   Lactic Acid, Venous 2.7 (*)    All other components within normal limits  COMPREHENSIVE METABOLIC PANEL - Abnormal; Notable for the following components:   Potassium 5.3 (*)    CO2 20 (*)    Glucose, Bld 110 (*)    Creatinine, Ser 1.32 (*)    Albumin 3.2 (*)  Total Bilirubin 0.2 (*)    GFR, Estimated 39 (*)    All other components within normal limits  CBC WITH DIFFERENTIAL/PLATELET - Abnormal; Notable for the following components:   WBC 12.5 (*)    Hemoglobin 10.3 (*)    HCT 34.8 (*)    MCHC 29.6 (*)    RDW 15.6 (*)    Platelets 414 (*)    Neutro Abs 8.3 (*)    Basophils Absolute 0.2 (*)    Abs Immature Granulocytes 0.09 (*)    All other components within normal limits  CULTURE, BLOOD (ROUTINE X 2)  CULTURE, BLOOD (ROUTINE X 2)  URINALYSIS, COMPLETE (UACMP) WITH MICROSCOPIC     EKG     RADIOLOGY  XR L knee: I independently viewed and interpreted the images; bony margin appears intact.  Radiology report indicates no acute abnormality.   PROCEDURES:  Critical Care performed: Yes, see critical care procedure note(s)  .Critical Care  Performed by: Dionne Bucy, MD Authorized by: Dionne Bucy, MD   Critical care provider statement:    Critical care time  (minutes):  30   Critical care time was exclusive of:  Separately billable procedures and treating other patients   Critical care was necessary to treat or prevent imminent or life-threatening deterioration of the following conditions:  Sepsis   Critical care was time spent personally by me on the following activities:  Development of treatment plan with patient or surrogate, discussions with consultants, evaluation of patient's response to treatment, examination of patient, ordering and review of laboratory studies, ordering and review of radiographic studies, ordering and performing treatments and interventions, pulse oximetry, re-evaluation of patient's condition, review of old charts and obtaining history from patient or surrogate   Care discussed with: admitting provider and accepting provider at another facility      MEDICATIONS ORDERED IN ED: Medications  lactated ringers bolus 1,000 mL (0 mLs Intravenous Stopped 02/14/23 1728)    And  lactated ringers bolus 500 mL (0 mLs Intravenous Stopped 02/14/23 1901)    And  lactated ringers bolus 250 mL (0 mLs Intravenous Stopped 02/14/23 1804)  vancomycin (VANCOCIN) IVPB 1000 mg/200 mL premix (0 mg Intravenous Stopped 02/14/23 1834)  piperacillin-tazobactam (ZOSYN) IVPB 3.375 g (0 g Intravenous Stopped 02/14/23 1717)     IMPRESSION / MDM / ASSESSMENT AND PLAN / ED COURSE  I reviewed the triage vital signs and the nursing notes.  89 year old female with PMH as noted above presents with concern for possible wound infection from left BKA from early May.  On exam the patient is overall well-appearing and her vital signs are normal.  There is an area of open wound with granulation tissue and some drainage but no induration, significant swelling, or surrounding erythema.  The patient denies any other focal symptoms to suggest other source of infection.  Differential diagnosis includes, but is not limited to, wound infection, cellulitis, abscess,  osteomyelitis.  We will obtain x-ray, lab workup, and reassess.  Patient's presentation is most consistent with acute presentation with potential threat to life or bodily function.  The patient is on the cardiac monitor to evaluate for evidence of arrhythmia and/or significant heart rate changes.   ----------------------------------------- 4:56 PM on 02/14/2023 -----------------------------------------  Lab workup shows leukocytosis and elevated concerning for sepsis.  X-ray shows no evidence of osteomyelitis.  I have ordered Vancomycin and Zosyn.  I am reaching out to transfer center to discuss possible transfer given that the patient was operated on by orthopedics there.  -----------------------------------------  6:14 PM on 02/14/2023 -----------------------------------------  I consulted and discussed the case with Dr. Tonny Bollman from orthopedics at [facility] who agrees with transfer there.  I then consulted and discussed the case with Dr. Allena Katz from the hospitalist service at [facility] who agrees to accept the patient.  The patient is stable for transfer at this time.   FINAL CLINICAL IMPRESSION(S) / ED DIAGNOSES   Final diagnoses:  Wound infection     Rx / DC Orders   ED Discharge Orders     None        Note:  This document was prepared using Dragon voice recognition software and may include unintentional dictation errors.    Dionne Bucy, MD 02/14/23 [phone]

## 2023-02-14 NOTE — Progress Notes (Signed)
Plan of Care Note for accepted transfer  Patient: FINN WEINS    WUJ:[id number]  DOA: 02/14/2023     Nursing staff, Please call [facility] Admits & Consults System-Wide number on Amion as soon as patient's arrival to the unit (not the listed attending) so that the appropriate admitting provider can evaluate the pt. ASAP to avoid any delay in care.  Facility requesting transfer: [facility] ED Requesting Provider: Dionne Bucy, MD  Reason for transfer: Admission for orthopedic evaluation for wound dehiscence Facility course: Patient with PMH of type II DM, diabetic neuropathy, HTN, HLD, hypothyroidism, breast cancer, COPD, OSA, CAD, pacemaker on Plavix, Eliquis, chronic presented ischemia SP SMA stent, CKD 3B presented to hospital with complaints of pain in her recent left BKA.  Underwent left BKA on 01/03/2023 by Dr. Jena Gauss. Patient noted that the scab on the amputation site was pulled off 2 days ago accidentally with removal of socks and started draining. EDP discussed with Dr. Tonny Bollman from orthopedics.  Who recommended the patient transferred to [facility] further evaluation.  Labs shows leukocytosis, hyperkalemia concern for sepsis.  X-ray negative for osteomyelitis.  Started on broad-spectrum antibiotic right now.  Blood cultures performed.  Plan of care: The patient is accepted for admission to Telemetry unit, at [facility].  Patient should not be transferred to any other facility other than [facility].  Notify orthopedic of patient's arrival. If patient's hemodynamic condition improves or potassium level improves then consider downgrading to MedSurg.  Author: Lynden Oxford, MD  02/14/2023  Check [email or web address] for on-call coverage.

## 2023-02-14 NOTE — ED Triage Notes (Signed)
Pt to ed from home via POV for wound dehiscence. Pt is caox4, in no acute distress in triage. Pt had BTK amputation 5/3 at [facility]. Pt is her ebc wound is painful and has busted open. Pt denies any fever, chills, N/V.

## 2023-02-14 NOTE — ED Notes (Signed)
Lactic 3.5

## 2023-02-14 NOTE — Progress Notes (Signed)
Pharmacy Antibiotic Note  Cassandra [name] is a 89 y.o. female admitted on 02/14/2023 with infected diabetic foot wound s/p BKA on 01/03/23.  Pharmacy has been consulted for vancomycin and cefepime dosing; also on flagyl.  Plan: Vancomycin 1g IV given at 17:32, continue with 750mg  IV q48h - next dose 6/16 for estimated AUC 466 using SCr 1.32, Vd 0.72 Check vancomycin levels at steady state, goal AUC 400-550 Cefepime 2g IV q24h / Flagyl 500mg  IV q12h Follow up renal function & cultures, clinical course, duration of therapy    Temp (24hrs), Avg:98 F (36.7 C), Min:97.3 F (36.3 C), Max:98.3 F (36.8 C)  Recent Labs  Lab 02/14/23 1559 02/14/23 1827  WBC 12.5*  --   CREATININE 1.32*  --   LATICACIDVEN 3.5* 2.7*    Estimated Creatinine Clearance: 21.9 mL/min (A) (by C-G formula based on SCr of 1.32 mg/dL (H)).    Allergies  Allergen Reactions   Baclofen Other (See Comments)    Hallucinations   Levofloxacin Other (See Comments)    Hallucinations     Antimicrobials this admission: 6/14 Zosyn x 1 6/14 Vancomycin >> 6/14 Cefepime >> 6/14 Flagyl >>  Dose adjustments this admission:  Microbiology results: none  Thank you for allowing pharmacy to be a part of this patient's care.  Loralee Pacas, PharmD, BCPS 02/14/2023 9:16 PM  Please check AMION for all [facility] Pharmacy phone numbers After 10:00 PM, call Main Pharmacy [phone]

## 2023-02-15 DIAGNOSIS — T8132XA Disruption of internal operation (surgical) wound, not elsewhere classified, initial encounter: Secondary | ICD-10-CM | POA: Diagnosis not present

## 2023-02-15 DIAGNOSIS — T8130XA Disruption of wound, unspecified, initial encounter: Secondary | ICD-10-CM | POA: Diagnosis not present

## 2023-02-15 LAB — CBC WITH DIFFERENTIAL/PLATELET
Abs Immature Granulocytes: 0.06 10*3/uL (ref 0.00–0.07)
Basophils Absolute: 0.1 10*3/uL (ref 0.0–0.1)
Basophils Relative: 1 %
Eosinophils Absolute: 0.5 10*3/uL (ref 0.0–0.5)
Eosinophils Relative: 5 %
HCT: 26.7 % — ABNORMAL LOW (ref 36.0–46.0)
Hemoglobin: 8.2 g/dL — ABNORMAL LOW (ref 12.0–15.0)
Immature Granulocytes: 1 %
Lymphocytes Relative: 18 %
Lymphs Abs: 1.8 10*3/uL (ref 0.7–4.0)
MCH: 25.9 pg — ABNORMAL LOW (ref 26.0–34.0)
MCHC: 30.7 g/dL (ref 30.0–36.0)
MCV: 84.2 fL (ref 80.0–100.0)
Monocytes Absolute: 1 10*3/uL (ref 0.1–1.0)
Monocytes Relative: 10 %
Neutro Abs: 6.5 10*3/uL (ref 1.7–7.7)
Neutrophils Relative %: 65 %
Platelets: 327 10*3/uL (ref 150–400)
RBC: 3.17 MIL/uL — ABNORMAL LOW (ref 3.87–5.11)
RDW: 15.3 % (ref 11.5–15.5)
WBC: 9.9 10*3/uL (ref 4.0–10.5)
nRBC: 0 % (ref 0.0–0.2)

## 2023-02-15 LAB — COMPREHENSIVE METABOLIC PANEL
ALT: 10 U/L (ref 0–44)
AST: 15 U/L (ref 15–41)
Albumin: 2.3 g/dL — ABNORMAL LOW (ref 3.5–5.0)
Alkaline Phosphatase: 58 U/L (ref 38–126)
Anion gap: 10 (ref 5–15)
BUN: 19 mg/dL (ref 8–23)
CO2: 23 mmol/L (ref 22–32)
Calcium: 8.4 mg/dL — ABNORMAL LOW (ref 8.9–10.3)
Chloride: 104 mmol/L (ref 98–111)
Creatinine, Ser: 1.23 mg/dL — ABNORMAL HIGH (ref 0.44–1.00)
GFR, Estimated: 42 mL/min — ABNORMAL LOW (ref >60)
Glucose, Bld: 149 mg/dL — ABNORMAL HIGH (ref 70–99)
Potassium: 5 mmol/L (ref 3.5–5.1)
Sodium: 137 mmol/L (ref 135–145)
Total Bilirubin: 0.4 mg/dL (ref 0.3–1.2)
Total Protein: 5.9 g/dL — ABNORMAL LOW (ref 6.5–8.1)

## 2023-02-15 LAB — MAGNESIUM: Magnesium: 1.5 mg/dL — ABNORMAL LOW (ref 1.7–2.4)

## 2023-02-15 LAB — C-REACTIVE PROTEIN: CRP: 0.8 mg/dL (ref <1.0)

## 2023-02-15 LAB — CULTURE, BLOOD (ROUTINE X 2): Special Requests: ADEQUATE

## 2023-02-15 LAB — ABO/RH: ABO/RH(D): A POS

## 2023-02-15 LAB — PROTIME-INR
INR: 1.5 — ABNORMAL HIGH (ref 0.8–1.2)
Prothrombin Time: 18.5 seconds — ABNORMAL HIGH (ref 11.4–15.2)

## 2023-02-15 LAB — GLUCOSE, CAPILLARY: Glucose-Capillary: 81 mg/dL (ref 70–99)

## 2023-02-15 LAB — LACTIC ACID, PLASMA: Lactic Acid, Venous: 1.6 mmol/L (ref 0.5–1.9)

## 2023-02-15 LAB — SEDIMENTATION RATE: Sed Rate: 85 mm/hr — ABNORMAL HIGH (ref 0–22)

## 2023-02-15 MED ORDER — SODIUM CHLORIDE 0.9 % IV SOLN: INTRAVENOUS | Status: DC

## 2023-02-15 MED ORDER — MAGNESIUM SULFATE 2 GM/50ML IV SOLN
2.0000 g | Freq: Once | INTRAVENOUS | Status: AC
  Administered 2023-02-15: 2 g via INTRAVENOUS
  Filled 2023-02-15: qty 50

## 2023-02-15 MED ORDER — AMOXICILLIN-POT CLAVULANATE 500-125 MG PO TABS: 1.0000 | ORAL_TABLET | Freq: Two times a day (BID) | ORAL | 0 refills | Status: AC

## 2023-02-15 MED ORDER — DOXYCYCLINE HYCLATE 100 MG PO TABS
100.0000 mg | ORAL_TABLET | Freq: Two times a day (BID) | ORAL | Status: DC
  Administered 2023-02-15: 100 mg via ORAL
  Filled 2023-02-15: qty 1

## 2023-02-15 MED ORDER — SACCHAROMYCES BOULARDII 250 MG PO CAPS
250.0000 mg | ORAL_CAPSULE | Freq: Two times a day (BID) | ORAL | Status: DC
  Administered 2023-02-15: 250 mg via ORAL
  Filled 2023-02-15: qty 1

## 2023-02-15 MED ORDER — DOXYCYCLINE HYCLATE 100 MG PO TABS: 100.0000 mg | ORAL_TABLET | Freq: Two times a day (BID) | ORAL | 0 refills | Status: AC

## 2023-02-15 MED ORDER — SACCHAROMYCES BOULARDII 250 MG PO CAPS: 250.0000 mg | ORAL_CAPSULE | Freq: Two times a day (BID) | ORAL | 0 refills | Status: AC

## 2023-02-15 MED ORDER — AMOXICILLIN-POT CLAVULANATE 500-125 MG PO TABS
1.0000 | ORAL_TABLET | Freq: Two times a day (BID) | ORAL | Status: DC
  Administered 2023-02-15: 1 via ORAL
  Filled 2023-02-15 (×2): qty 1

## 2023-02-15 NOTE — Consult Note (Signed)
ORTHOPAEDIC CONSULTATION  REQUESTING PHYSICIAN: Rolly Salter, MD  Chief Complaint: Left below-knee amputation concern for possible wound infection  HPI: Cassandra [name] is a 89 y.o. female who underwent below-knee amputation with Dr. Jena Gauss on the left leg on 01/03/2023.  She reports that when she was seen by home health the other day they had concerns that her wound might have infection and recommended she go to the emergency room.  She presented to [facility] regional and given that her care was with Dr. Jena Gauss recommended she be transferred to [facility].  She overall is doing well.  Denies fevers or chills.  Denies any drainage from her incision or wound.  Past Medical History:  Diagnosis Date   Acquired hypothyroidism 01/06/2014   Anxiety    Arthritis    B12 deficiency 02/15/2014   Benign essential hypertension 01/06/2014   Breast cancer (HCC) 1982   Right breast cancer - chemotherapy   CAD (coronary artery disease), native coronary artery 01/06/2014   Carotid artery calcification    Chronic airway obstruction, not elsewhere classified 01/06/2014   Chronic mesenteric ischemia (HCC)    s/p SMA stent 11/03/19   Depression    Diabetes mellitus without complication (HCC)    Dysrhythmia    aflutter with RVR 06/2016   History of kidney stones 2019   left ureteral stone   Incontinence of urine    Myocardial infarction (HCC) 1982   small infarct   Neuromuscular disorder (HCC)    restless legs   Pacemaker    Presence of permanent cardiac pacemaker 07/2016   Pure hypercholesterolemia 01/06/2014   Renal insufficiency    Skin cancer    Sleep apnea    no longer uses a cpap. Uses oxygen as needed   Past Surgical History:  Procedure Laterality Date   AMPUTATION Left 01/03/2023   Procedure: AMPUTATION BELOW KNEE;  Surgeon: Roby Lofts, MD;  Location: [facility] OR;  Service: Orthopedics;  Laterality: Left;   ANKLE FUSION Left 10/10/2021   Procedure: ANKLE FUSION;  Surgeon: Roby Lofts, MD;  Location: [facility] OR;  Service: Orthopedics;  Laterality: Left;   APPENDECTOMY     APPLICATION OF WOUND VAC Left 02/24/2021   Procedure: APPLICATION OF WOUND VAC;  Surgeon: Samson Frederic, MD;  Location: [facility] OR;  Service: Orthopedics;  Laterality: Left;   AUGMENTATION MAMMAPLASTY Bilateral 1982/redo in 2014   prior mastectomy   CARDIAC CATHETERIZATION N/A 04/23/2016   Procedure: Left Heart Cath and Coronary Angiography;  Surgeon: Lamar Blinks, MD;  Location: [facility] INVASIVE CV LAB;  Service: Cardiovascular;  Laterality: N/A;   CARDIAC CATHETERIZATION Left 04/23/2016   Procedure: Coronary Stent Intervention;  Surgeon: Alwyn Pea, MD;  Location: [facility] INVASIVE CV LAB;  Service: Cardiovascular;  Laterality: Left;   CAROTID ARTERY ANGIOPLASTY Left 2010   had stent inserted and removed d/t infection. artery from leg inserted in left carotid   CAROTID ENDARTERECTOMY Left 2010   left CEA ~ 2010, s/p excision infected left carotid patch & pseudoaneurysm with left CCA-ICA bypass using SVG 05/20/12   CHOLECYSTECTOMY     CORONARY ANGIOPLASTY WITH STENT PLACEMENT  september 9th 2017   CYSTOSCOPY W/ URETERAL STENT PLACEMENT Left 08/17/2017   Procedure: CYSTOSCOPY WITH RETROGRADE PYELOGRAM/URETERAL STENT PLACEMENT;  Surgeon: Jerilee Field, MD;  Location: [facility] ORS;  Service: Urology;  Laterality: Left;   CYSTOSCOPY W/ URETERAL STENT PLACEMENT Left 09/16/2017   Procedure: CYSTOSCOPY WITH STENT REPLACEMENT;  Surgeon: [name], MD;  Location: [facility] ORS;  Service: Urology;  Laterality: Left;   CYSTOSCOPY/RETROGRADE/URETEROSCOPY/STONE EXTRACTION WITH BASKET Left 09/16/2017   Procedure: CYSTOSCOPY/RETROGRADE/URETEROSCOPY/STONE EXTRACTION WITH BASKET;  Surgeon: [name], MD;  Location: [facility] ORS;  Service: Urology;  Laterality: Left;   EXTERNAL FIXATION LEG Left 02/24/2021   Procedure: EXTERNAL FIXATION  ANKLE;  Surgeon: Samson Frederic, MD;  [facility]: [facility];  Service: Orthopedics;   Laterality: Left;   EXTERNAL FIXATION REMOVAL Left 02/26/2021   Procedure: REMOVAL EXTERNAL FIXATION LEG;  Surgeon: Roby Lofts, MD;  Location: [facility];  Service: Orthopedics;  Laterality: Left;   EYE SURGERY Bilateral    cataract extraction   I & D EXTREMITY Left 02/24/2021   Procedure: IRRIGATION AND DEBRIDEMENT EXTREMITY;  Surgeon: Samson Frederic, MD;  Location: [facility];  Service: Orthopedics;  Laterality: Left;   I & D EXTREMITY Left 12/21/2021   Procedure: IRRIGATION AND DEBRIDEMENT LEFT ANKLE;  Surgeon: Roby Lofts, MD;  Location: [facility];  Service: Orthopedics;  Laterality: Left;   I & D EXTREMITY Left 05/03/2022   Procedure: DEBRIDEMENT OF LEFT ANKLE INFECTION;  Surgeon: Roby Lofts, MD;  Location: [facility];  Service: Orthopedics;  Laterality: Left;   IR FLUORO GUIDE CV LINE LEFT  12/27/2021   IR FLUORO GUIDE CV LINE RIGHT  10/12/2021   IR REMOVAL TUN CV CATH W/O FL  11/22/2021   IR REMOVAL TUN CV CATH W/O FL  10/10/2022   IR US GUIDE VASC ACCESS LEFT  12/27/2021   IR US GUIDE VASC ACCESS RIGHT  10/12/2021   KYPHOPLASTY N/A 02/03/2018   Procedure: [id number];  Surgeon: Kennedy Bucker, MD;  Location: [facility] ORS;  Service: Orthopedics;  Laterality: N/A;   MASTECTOMY Right 1982   ORIF ANKLE FRACTURE Left 02/26/2021   Procedure: OPEN REDUCTION INTERNAL FIXATION (ORIF) ANKLE FRACTURE;  Surgeon: Roby Lofts, MD;  Location: [facility];  Service: Orthopedics;  Laterality: Left;   PACEMAKER INSERTION Left 07/03/2016   Procedure: INSERTION PACEMAKER;  Surgeon: Marcina Millard, MD;  Location: [facility] ORS;  Service: Cardiovascular;  Laterality: Left;   SKIN CANCER EXCISION     TONSILLECTOMY     TUBAL LIGATION     VISCERAL ANGIOGRAPHY N/A 11/03/2019   Procedure: VISCERAL ANGIOGRAPHY;  Surgeon: Renford Dills, MD;  Location: [facility] INVASIVE CV LAB;  Service: Cardiovascular;  Laterality: N/A;   Social History   Socioeconomic History   Marital status: Married    Spouse name: [name]   Number of  children: 3   Years of education: Not on file   Highest education level: High school graduate  Occupational History   Not on file  Tobacco Use   Smoking status: Former    Packs/day: .5    Types: Cigarettes    Start date: 70    Quit date: 1956    Years since quitting: 68.5    Passive exposure: Past   Smokeless tobacco: Never  Vaping Use   Vaping Use: Never used  Substance and Sexual Activity   Alcohol use: Yes    Comment: occasional wine   Drug use: Not Currently   Sexual activity: Not Currently  Other Topics Concern   Not on file  Social History Narrative   ** Merged History Encounter **       Lives with husband and dog    Social Determinants of Health   Financial Resource Strain: Low Risk  (10/04/2018)   Overall Financial Resource Strain (CARDIA)    Difficulty of Paying Living Expenses: Not hard at all  Food  Insecurity: No Food Insecurity (10/04/2018)   Hunger Vital Sign    Worried About Running Out of Food in the Last Year: Never true    Ran Out of Food in the Last Year: Never true  Transportation Needs: No Transportation Needs (10/04/2018)   PRAPARE - Administrator, Civil Service (Medical): No    Lack of Transportation (Non-Medical): No  Physical Activity: Unknown (10/04/2018)   Exercise Vital Sign    Days of Exercise per Week: Patient declined    Minutes of Exercise per Session: Patient declined  Stress: Not on file  Social Connections: Unknown (10/04/2018)   Social Connection and Isolation Panel [NHANES]    Frequency of Communication with Friends and Family: Patient declined    Frequency of Social Gatherings with Friends and Family: Patient declined    Attends Religious Services: Patient declined    Database administrator or Organizations: Patient declined    Attends Engineer, structural: Patient declined    Marital Status: Patient declined   Family History  Problem Relation Age of Onset   Prostate cancer Neg Hx    Kidney cancer Neg Hx     Breast cancer Neg Hx    Allergies  Allergen Reactions   Baclofen Other (See Comments)    Hallucinations   Levofloxacin Other (See Comments)    Hallucinations      Positive ROS: All other systems have been reviewed and were otherwise negative with the exception of those mentioned in the HPI and as above.  Physical Exam: General: Alert, no acute distress Cardiovascular: No pedal edema Respiratory: No cyanosis, no use of accessory musculature Skin: No lesions in the area of chief complaint Neurologic: Sensation intact distally Psychiatric: Patient is competent for consent with normal mood and affect  MUSCULOSKELETAL:  LLE large eschar around the stump area of her healing incision, mild superficial dehiscence, no drainage or erythema  Stump is warm and well-perfused with good cap refill  No significant distal edema   IMAGING: X-rays left knee demonstrate below-knee amputation without acute fracture dislocation or other osseous abnormalities.  Assessment: Principal Problem:   Wound dehiscence Active Problems:   Acquired hypothyroidism   Essential hypertension   DM2 (diabetes mellitus, type 2) (HCC)   Severe sepsis (HCC)   HLD (hyperlipidemia)   Chronic combined systolic and diastolic heart failure (HCC)   Hyperkalemia   History of COPD   Paroxysmal atrial fibrillation (HCC)   CKD stage 3b, GFR 30-44 ml/min (HCC)   Anemia of chronic disease  Left below-knee amputation by Dr. Jena Gauss 01/03/2023, mild superficial dehiscence and eschar, no acute infection   Plan: Examination of the stump reassuring.  Patient's lactate and white count have also normalized.  Would recommend discharge on oral antibiotics and follow-up with Dr. Jena Gauss this week.  No plan for acute surgical intervention at this time. BKA stump shrinker sleeve was ordered for the patient from [facility].  Would recommend she go home with these.  Recommended daily dressing changes of the stump shrinker at home.  Okay to  wash.  Nonweightbearing on the left lower extremity.    Joen Laura, MD  Contact information:   [email or web address] 7am-5pm epic message Dr. Blanchie Dessert, or call office for patient follow up: [phone] After hours and holidays please check [email or web address] for group call information for [facility]

## 2023-02-15 NOTE — Care Management Obs Status (Signed)
MEDICARE OBSERVATION STATUS NOTIFICATION   Patient Details  Name: REANNE AMBLE MRN: [id number] Date of Birth: 14-Jan-1933   Medicare Observation Status Notification Given:  Yes    Galileo Colello G., RN 02/15/2023, 11:09 AM

## 2023-02-15 NOTE — Progress Notes (Addendum)
Orthopedic Tech Progress Note Patient Details:  Cassandra [name] March 22, 1933 [id number]  Called in order to [facility] for a BKA SHRINKERS   Patient ID: Cassandra [name], female   DOB: 12-29-1932, 89 y.o.   MRN: [id number]  [name] 02/15/2023, 8:46 AM

## 2023-02-15 NOTE — Discharge Summary (Signed)
Physician Discharge Summary   Patient: Cassandra [name] MRN: [id number] DOB: 05-Mar-1933  Admit date:     02/14/2023  Discharge date: 02/15/2023  Discharge Physician: Lynden [place]  PCP: [name], MD  Recommendations at discharge: Follow-up with PCP within 1 week. Follow-up with orthopedics in 1 week. Follow-up with [facility] to pick up stump shrinker.   Follow-up Information     [name], MD. Schedule an appointment as soon as possible for a visit in 1 week(s).   Specialty: Internal Medicine Contact information: [address] [facility] [place] [address] [phone]         Roby Lofts, MD. Go in 1 week(s).   Specialty: Orthopedic Surgery Why: Appointment As Scheduled Contact information: [address] [place] [address] [phone]         [facility] Follow [facility].   Why: pick up the stum shinker. can call them to get delivered to  clinic Contact information: [place] [address] [place], [place] [address] Phone: [phone]               Discharge Diagnoses: Principal Problem:   Wound dehiscence Active Problems:   Chronic combined systolic and diastolic heart failure (HCC)   Acquired hypothyroidism   DM2 (diabetes mellitus, type 2) (HCC)   Essential hypertension   Severe sepsis (HCC)   HLD (hyperlipidemia)   Hyperkalemia   History of COPD   Paroxysmal atrial fibrillation (HCC)   CKD stage 3b, GFR 30-44 ml/min (HCC)   Anemia of chronic disease  Assessment and Plan  Left BKA. Wound dehiscence. Patient presented with drainage from left BKA. Initially at [facility] ED.  Was transferred to [facility] for orthopedic evaluation. Orthopedic examined the patient and felt that the wound appears to be noninfected. Still recommended to continue some oral antibiotics and follow-up with Dr. Jena Gauss in 1 week. Patient has a follow-up scheduled on Tuesday. Stump centers were ordered which will be  delivered from [facility]. Recommended daily dressing changes. Nonweightbearing of the left lower extremity. Okay to wash.  CKD 3B. Renal function stable. Monitor.  Mild hyperkalemia. Potassium level is elevated. Patient's home oral potassium regimen was adjusted.  EGD. CAD. Patient on Eliquis and Plavix. Which I will continue.  Neuropathy. Continue gabapentin.  Hypothyroidism. Continue Synthroid.  OSA. Not on CPAP.  Paroxysmal A-fib, SP patient did not plan. On Eliquis. Which I will continue.  Chronic combined systolic and diastolic CHF. Monitor this appears to be adequate. Continue home regimen of diuretic but hold potassium due to hyperkalemia.   Consultants:  Orthopedics  Procedures performed:  None  DISCHARGE MEDICATION: Allergies as of 02/15/2023       Reactions   Baclofen Other (See Comments)   Hallucinations   Levofloxacin Other (See Comments)   Hallucinations         Medication List     STOP taking these medications    potassium chloride 10 MEQ tablet Commonly known as: KLOR-CON M       TAKE these medications    acetaminophen 325 MG tablet Commonly known as: TYLENOL Take 1-2 tablets (325-650 mg total) by mouth every 6 (six) hours as needed for mild pain (pain score 1-3 or temp > 100.5).   amoxicillin-clavulanate 500-125 MG tablet Commonly known as: AUGMENTIN Take 1 tablet by mouth 2 (two) times daily for 7 days.   apixaban 2.5 MG Tabs tablet Commonly known as: ELIQUIS Take 1 tablet (2.5 mg total) by mouth 2 (  two) times daily.   clopidogrel 75 MG tablet Commonly known as: PLAVIX Take 1 tablet (75 mg total) by mouth daily.   desvenlafaxine 50 MG 24 hr tablet Commonly known as: PRISTIQ Take 100 mg by mouth every evening.   docusate sodium 100 MG capsule Commonly known as: COLACE Take 1 capsule (100 mg total) by mouth 2 (two) times daily.   doxycycline 100 MG tablet Commonly known as: VIBRA-TABS Take 1 tablet (100 mg  total) by mouth 2 (two) times daily for 7 days.   gabapentin 100 MG capsule Commonly known as: NEURONTIN Take 1 capsule (100 mg total) by mouth 2 (two) times daily as needed (severe phantom limb pain).   hydrOXYzine 10 MG tablet Commonly known as: ATARAX Take 1 tablet (10 mg total) by mouth 3 (three) times daily as needed.   levothyroxine 112 MCG tablet Commonly known as: SYNTHROID Take 112 mcg by mouth daily before breakfast.   Melatonin 10 MG Caps Take 10 mg by mouth at bedtime as needed (sleep).   metFORMIN 500 MG tablet Commonly known as: GLUCOPHAGE Take 1 tablet (500 mg total) by mouth 2 (two) times daily with a meal.   methocarbamol 500 MG tablet Commonly known as: ROBAXIN Take 1 tablet (500 mg total) by mouth every 6 (six) hours as needed for muscle spasms.   metoprolol tartrate 25 MG tablet Commonly known as: LOPRESSOR Take 1 tablet (25 mg total) by mouth 2 (two) times daily.   multivitamin with minerals Tabs tablet Take 1 tablet by mouth daily.   nitroGLYCERIN 0.4 MG SL tablet Commonly known as: NITROSTAT Place 1 tablet (0.4 mg total) under the tongue every 5 (five) minutes as needed for chest pain.   pregabalin 25 MG capsule Commonly known as: LYRICA Take 1 capsule (25 mg total) by mouth at bedtime.   Qutenza (4 Patch) 8 % Generic drug: capsaicin topical system Apply 2 patches topically once. Placed on affected area of skin every 90 days as needed for diabetic neuropathy Start taking on: March 14, 2023   rosuvastatin 10 MG tablet Commonly known as: CRESTOR Take 10 mg by mouth at bedtime.   saccharomyces boulardii 250 MG capsule Commonly known as: FLORASTOR Take 1 capsule (250 mg total) by mouth 2 (two) times daily for 7 days.   torsemide 20 MG tablet Commonly known as: DEMADEX Take 1 tablet (20 mg total) by mouth daily.   traMADol 50 MG tablet Commonly known as: ULTRAM Take 1 tablet (50 mg total) by mouth every 6 (six) hours as needed for moderate  pain or severe pain.   Vitamin D (Ergocalciferol) 1.25 MG (50000 UNIT) Caps capsule Commonly known as: DRISDOL Take 1 capsule (50,000 Units total) by mouth every 7 (seven) days. Once a week on Thursdays   VITAMIN D PO Take 1 capsule by mouth daily.               Discharge Care Instructions  (From admission, onward)           Start     Ordered   02/15/23 0000  Discharge wound care:       Comments: BKA stump shrinker sleeve was ordered for the patient from [facility]. Recommended daily dressing changes of the stump shrinker at home.  Okay to wash.  Nonweightbearing on the left lower extremity.   02/15/23 1107           Disposition: Home Diet recommendation: Regular diet  Discharge Exam: Vitals:   02/15/23 0047 02/15/23 1027  BP: (!) 138/56 (!) 168/58  Pulse: 62 62  Temp: 97.8 F (36.6 C)   TempSrc: Oral   SpO2: 91% 96%   General: Appear in no distress; no visible Abnormal Neck Mass Or lumps, Conjunctiva normal Cardiovascular: S1 and S2 Present, no Murmur, Respiratory: good respiratory effort, Bilateral Air entry present and CTA, no Crackles, no wheezes Abdomen: Bowel Sound present, Non tender  Extremities: Left BKA wrapped by orthopedics, no pedal edema Neurology: alert and oriented to time, place, and person, hard of hearing  Condition at discharge: stable  The results of significant diagnostics from this hospitalization (including imaging, microbiology, ancillary and laboratory) are listed below for reference.   Imaging Studies: DG Chest Port 1 View  Result Date: 02/14/2023 CLINICAL DATA:  Leukocytosis EXAM: PORTABLE CHEST 1 VIEW COMPARISON:  08/09/2022 FINDINGS: Cardiac shadow is at the upper limits of normal in size. Pacing device is seen. Aortic calcifications are noted. The lungs are well aerated bilaterally. Interstitial scarring is noted bilaterally without focal infiltrate. Changes of prior vertebral augmentation are seen. IMPRESSION: No acute  abnormality noted. Electronically Signed   By: Alcide Clever M.D.   On: 02/14/2023 22:18   DG Knee 2 Views Left  Result Date: 02/14/2023 CLINICAL DATA:  Infection EXAM: LEFT KNEE - 1-2 VIEW COMPARISON:  CT of the left tibia and fibula 09/20/2022 FINDINGS: Patient is status post interval below-the-knee amputation. Osseous surgical margin appears within normal limits. There is overlying soft tissue swelling air. Peripheral vascular calcifications are present. No acute fracture or dislocation. There are moderate degenerative changes of the lateral compartment of the knee. IMPRESSION: Status post interval below-the-knee amputation. Osseous surgical margin appears within normal limits. Electronically Signed   By: Darliss Cheney M.D.   On: 02/14/2023 16:36    Microbiology: Results for orders placed or performed during the hospital encounter of 02/14/23  Blood Culture (routine x 2)     Status: None (Preliminary result)   Collection Time: 02/14/23  4:41 PM   Specimen: BLOOD  Result Value Ref Range Status   Specimen Description BLOOD BLOOD LEFT FOREARM  Final   Special Requests   Final    BOTTLES DRAWN AEROBIC AND ANAEROBIC Blood Culture adequate volume   Culture   Final    NO GROWTH < 24 HOURS Performed at [facility], [address] [place], [place] [address]    Report Status PENDING  Incomplete  Blood Culture (routine x 2)     Status: None (Preliminary result)   Collection Time: 02/14/23  4:41 PM   Specimen: BLOOD  Result Value Ref Range Status   Specimen Description BLOOD LEFT ANTECUBITAL  Final   Special Requests   Final    BOTTLES DRAWN AEROBIC AND ANAEROBIC Blood Culture adequate volume   Culture   Final    NO GROWTH < 24 HOURS Performed at [facility], [address] [place], [place] [address]    Report Status PENDING  Incomplete   Labs: CBC: Recent Labs  Lab 02/14/23 1559 02/15/23 0059  WBC 12.5* 9.9  NEUTROABS 8.3* 6.5  HGB 10.3* 8.2*  HCT 34.8* 26.7*   MCV 88.8 84.2  PLT 414* 327   Basic Metabolic Panel: Recent Labs  Lab 02/14/23 1559 02/15/23 0059  NA 142 137  K 5.3* 5.0  CL 108 104  CO2 20* 23  GLUCOSE 110* 149*  BUN 23 19  CREATININE 1.32* 1.23*  CALCIUM 9.3 8.4*  MG  --  1.5*   Liver Function Tests:  Recent Labs  Lab 02/14/23 1559 02/15/23 0059  AST 19 15  ALT 9 10  ALKPHOS 74 58  BILITOT 0.2* 0.4  PROT 7.8 5.9*  ALBUMIN 3.2* 2.3*   CBG: Recent Labs  Lab 02/14/23 2358 02/15/23 0555  GLUCAP 90 81    Discharge time spent: greater than 30 minutes.  Signed: Lynden [place], MD [facility] 02/15/2023

## 2023-02-15 NOTE — Care Management CC44 (Signed)
Condition Code 44 Documentation Completed  Patient Details  Name: Cassandra [name] MRN: [id number] Date of Birth: 10/04/1932   Condition Code 44 given:    Patient signature on Condition Code 44 notice:    Documentation of 2 MD's agreement:    Code 44 added to claim:       Erlean Mealor G., RN 02/15/2023, 11:19 AM  

## 2023-02-15 NOTE — Discharge Instructions (Signed)
Orthopedic Discharge Instructions  Diet: As you were doing prior to hospitalization   Shower:  May shower but keep the wounds dry, use an occlusive plastic wrap, NO SOAKING IN TUB.  If the bandage gets wet, change with a clean dry gauze.     Dressing: Apply the stump shrinker stocking to the left lower extremity.  Maintain at all times.  Change once daily.  Okay to hand wash and dry.    Weight Bearing:   No pressure or weight directly on the stump..    Please follow-up with Dr. Jena Gauss this week as already scheduled.

## 2023-02-15 NOTE — Care Management CC44 (Signed)
Condition Code 44 Documentation Completed  Patient Details  Name: COLEMAN EVERROAD MRN: [id number] Date of Birth: 11/30/1932   Condition Code 44 given:    Patient signature on Condition Code 44 notice:    Documentation of 2 MD's agreement:    Code 44 added to claim:       Dajah Fischman G., RN 02/15/2023, 11:19 AM

## 2023-02-16 LAB — CULTURE, BLOOD (ROUTINE X 2)

## 2023-02-18 LAB — CULTURE, BLOOD (ROUTINE X 2)
Culture: NO GROWTH
Culture: NO GROWTH
Special Requests: ADEQUATE

## 2023-03-14 ENCOUNTER — Encounter
Payer: Medicare Other | Attending: Physical Medicine and Rehabilitation | Admitting: Physical Medicine and Rehabilitation

## 2023-03-20 ENCOUNTER — Encounter: Payer: Self-pay | Admitting: Internal Medicine

## 2023-03-20 ENCOUNTER — Ambulatory Visit (INDEPENDENT_AMBULATORY_CARE_PROVIDER_SITE_OTHER): Payer: Medicare Other | Admitting: Internal Medicine

## 2023-03-20 ENCOUNTER — Other Ambulatory Visit: Payer: Self-pay

## 2023-03-20 VITALS — BP 121/67 | HR 67 | Resp 16 | Ht 59.0 in | Wt 116.0 lb

## 2023-03-20 DIAGNOSIS — M869 Osteomyelitis, unspecified: Secondary | ICD-10-CM
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
## 2023-04-17 ENCOUNTER — Ambulatory Visit: Payer: Medicare Other | Admitting: Physical Therapy

## 2023-04-17 ENCOUNTER — Ambulatory Visit: Payer: Medicare Other | Attending: Internal Medicine | Admitting: Physical Therapy

## 2023-04-17 DIAGNOSIS — Z89512 Acquired absence of left leg below knee: Secondary | ICD-10-CM | POA: Diagnosis not present

## 2023-04-17 DIAGNOSIS — M6281 Muscle weakness (generalized): Secondary | ICD-10-CM | POA: Diagnosis present

## 2023-04-17 DIAGNOSIS — R269 Unspecified abnormalities of gait and mobility: Secondary | ICD-10-CM | POA: Diagnosis present

## 2023-04-17 DIAGNOSIS — R2689 Other abnormalities of gait and mobility: Secondary | ICD-10-CM | POA: Insufficient documentation
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
## 2023-04-23 ENCOUNTER — Ambulatory Visit: Payer: Medicare Other | Admitting: Physical Therapy

## 2023-04-23 DIAGNOSIS — Z89512 Acquired absence of left leg below knee: Secondary | ICD-10-CM

## 2023-04-23 DIAGNOSIS — M6281 Muscle weakness (generalized): Secondary | ICD-10-CM

## 2023-04-23 DIAGNOSIS — R269 Unspecified abnormalities of gait and mobility: Secondary | ICD-10-CM

## 2023-04-23 DIAGNOSIS — R2689 Other abnormalities of gait and mobility: Secondary | ICD-10-CM
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
## 2023-04-29 ENCOUNTER — Ambulatory Visit: Payer: Medicare Other | Admitting: Physical Therapy

## 2023-04-29 DIAGNOSIS — R269 Unspecified abnormalities of gait and mobility: Secondary | ICD-10-CM

## 2023-04-29 DIAGNOSIS — M6281 Muscle weakness (generalized): Secondary | ICD-10-CM

## 2023-04-29 DIAGNOSIS — R2689 Other abnormalities of gait and mobility: Secondary | ICD-10-CM

## 2023-04-29 DIAGNOSIS — Z89512 Acquired absence of left leg below knee: Secondary | ICD-10-CM | POA: Diagnosis not present
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
## 2023-04-30 ENCOUNTER — Encounter: Payer: Medicare Other | Admitting: Physical Therapy

## 2023-05-01 ENCOUNTER — Ambulatory Visit: Payer: Medicare Other | Admitting: Physical Therapy

## 2023-05-01 DIAGNOSIS — Z89512 Acquired absence of left leg below knee: Secondary | ICD-10-CM

## 2023-05-01 DIAGNOSIS — R269 Unspecified abnormalities of gait and mobility: Secondary | ICD-10-CM

## 2023-05-01 DIAGNOSIS — R2689 Other abnormalities of gait and mobility: Secondary | ICD-10-CM

## 2023-05-01 DIAGNOSIS — M6281 Muscle weakness (generalized): Secondary | ICD-10-CM
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
## 2023-05-07 ENCOUNTER — Ambulatory Visit: Payer: Medicare Other | Attending: Internal Medicine | Admitting: Physical Therapy

## 2023-05-07 DIAGNOSIS — R269 Unspecified abnormalities of gait and mobility: Secondary | ICD-10-CM | POA: Insufficient documentation

## 2023-05-07 DIAGNOSIS — R2689 Other abnormalities of gait and mobility: Secondary | ICD-10-CM | POA: Diagnosis present

## 2023-05-07 DIAGNOSIS — M6281 Muscle weakness (generalized): Secondary | ICD-10-CM | POA: Insufficient documentation

## 2023-05-07 DIAGNOSIS — Z89512 Acquired absence of left leg below knee: Secondary | ICD-10-CM | POA: Insufficient documentation
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
## 2023-05-14 ENCOUNTER — Encounter: Payer: Medicare Other | Admitting: Physical Therapy

## 2023-05-14 ENCOUNTER — Ambulatory Visit: Payer: Medicare Other | Admitting: Physical Therapy

## 2023-05-14 DIAGNOSIS — M6281 Muscle weakness (generalized): Secondary | ICD-10-CM

## 2023-05-14 DIAGNOSIS — R2689 Other abnormalities of gait and mobility: Secondary | ICD-10-CM

## 2023-05-14 DIAGNOSIS — R269 Unspecified abnormalities of gait and mobility: Secondary | ICD-10-CM

## 2023-05-14 DIAGNOSIS — Z89512 Acquired absence of left leg below knee: Secondary | ICD-10-CM
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
## 2023-05-20 ENCOUNTER — Ambulatory Visit: Payer: Medicare Other | Admitting: Physical Therapy

## 2023-05-20 DIAGNOSIS — Z89512 Acquired absence of left leg below knee: Secondary | ICD-10-CM

## 2023-05-20 DIAGNOSIS — R2689 Other abnormalities of gait and mobility: Secondary | ICD-10-CM

## 2023-05-20 DIAGNOSIS — M6281 Muscle weakness (generalized): Secondary | ICD-10-CM

## 2023-05-20 DIAGNOSIS — R269 Unspecified abnormalities of gait and mobility: Secondary | ICD-10-CM
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
## 2023-05-22 ENCOUNTER — Ambulatory Visit: Payer: Medicare Other

## 2023-05-22 DIAGNOSIS — M6281 Muscle weakness (generalized): Secondary | ICD-10-CM

## 2023-05-22 DIAGNOSIS — Z89512 Acquired absence of left leg below knee: Secondary | ICD-10-CM

## 2023-05-22 DIAGNOSIS — R269 Unspecified abnormalities of gait and mobility: Secondary | ICD-10-CM

## 2023-05-22 DIAGNOSIS — R2689 Other abnormalities of gait and mobility: Secondary | ICD-10-CM
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
## 2023-05-23 ENCOUNTER — Encounter: Payer: Medicare Other | Admitting: Physical Therapy

## 2023-05-29 ENCOUNTER — Ambulatory Visit: Payer: Medicare Other | Admitting: Physical Therapy

## 2023-05-29 DIAGNOSIS — Z89512 Acquired absence of left leg below knee: Secondary | ICD-10-CM

## 2023-05-29 DIAGNOSIS — R269 Unspecified abnormalities of gait and mobility: Secondary | ICD-10-CM

## 2023-05-29 DIAGNOSIS — M6281 Muscle weakness (generalized): Secondary | ICD-10-CM

## 2023-05-29 DIAGNOSIS — R2689 Other abnormalities of gait and mobility: Secondary | ICD-10-CM
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
## 2023-06-03 ENCOUNTER — Ambulatory Visit: Payer: Medicare Other | Attending: Internal Medicine | Admitting: Physical Therapy

## 2023-06-03 DIAGNOSIS — Z89512 Acquired absence of left leg below knee: Secondary | ICD-10-CM | POA: Insufficient documentation

## 2023-06-03 DIAGNOSIS — R269 Unspecified abnormalities of gait and mobility: Secondary | ICD-10-CM | POA: Diagnosis present

## 2023-06-03 DIAGNOSIS — M6281 Muscle weakness (generalized): Secondary | ICD-10-CM | POA: Insufficient documentation

## 2023-06-03 DIAGNOSIS — R2689 Other abnormalities of gait and mobility: Secondary | ICD-10-CM | POA: Diagnosis present
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
## 2023-06-04 ENCOUNTER — Encounter: Payer: Medicare Other | Admitting: Physical Therapy

## 2023-06-05 ENCOUNTER — Ambulatory Visit: Payer: Medicare Other | Admitting: Physical Therapy

## 2023-06-05 DIAGNOSIS — R2689 Other abnormalities of gait and mobility: Secondary | ICD-10-CM

## 2023-06-05 DIAGNOSIS — Z89512 Acquired absence of left leg below knee: Secondary | ICD-10-CM

## 2023-06-05 DIAGNOSIS — R269 Unspecified abnormalities of gait and mobility: Secondary | ICD-10-CM | POA: Diagnosis not present

## 2023-06-05 DIAGNOSIS — M6281 Muscle weakness (generalized): Secondary | ICD-10-CM
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
## 2023-06-09 ENCOUNTER — Telehealth: Payer: Self-pay | Admitting: Physical Therapy

## 2023-06-09 ENCOUNTER — Ambulatory Visit: Payer: Medicare Other | Admitting: Physical Therapy

## 2023-06-09 NOTE — Telephone Encounter (Signed)
Pt missed Scheduled PT appointment on 06/09/23. This PT called pt to notify pt. No answer. Left voicemail with details for next scheduled PT treatment on 10/9 at 3:30pm.   Grier Rocher PT, DPT  Physical Therapist - [facility]  3:31 PM 06/09/23

## 2023-06-11 ENCOUNTER — Ambulatory Visit: Payer: Medicare Other | Admitting: Physical Therapy

## 2023-06-12 ENCOUNTER — Ambulatory Visit: Payer: Medicare Other | Admitting: Physical Therapy

## 2023-06-12 DIAGNOSIS — R269 Unspecified abnormalities of gait and mobility: Secondary | ICD-10-CM | POA: Diagnosis not present

## 2023-06-12 DIAGNOSIS — M6281 Muscle weakness (generalized): Secondary | ICD-10-CM

## 2023-06-12 DIAGNOSIS — R2689 Other abnormalities of gait and mobility: Secondary | ICD-10-CM

## 2023-06-12 DIAGNOSIS — Z89512 Acquired absence of left leg below knee: Secondary | ICD-10-CM
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
## 2023-06-16 ENCOUNTER — Ambulatory Visit: Payer: Medicare Other

## 2023-06-17 ENCOUNTER — Encounter: Payer: Self-pay | Admitting: Physical Therapy

## 2023-06-17 ENCOUNTER — Ambulatory Visit: Payer: Medicare Other | Admitting: Physical Therapy

## 2023-06-17 DIAGNOSIS — R269 Unspecified abnormalities of gait and mobility: Secondary | ICD-10-CM | POA: Diagnosis not present

## 2023-06-17 DIAGNOSIS — R2689 Other abnormalities of gait and mobility: Secondary | ICD-10-CM
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
## 2023-06-18 ENCOUNTER — Encounter: Payer: Medicare Other | Admitting: Physical Therapy

## 2023-06-19 ENCOUNTER — Ambulatory Visit: Payer: Medicare Other | Admitting: Physical Therapy

## 2023-06-19 DIAGNOSIS — R269 Unspecified abnormalities of gait and mobility: Secondary | ICD-10-CM

## 2023-06-19 DIAGNOSIS — R2689 Other abnormalities of gait and mobility: Secondary | ICD-10-CM

## 2023-06-19 DIAGNOSIS — Z89512 Acquired absence of left leg below knee: Secondary | ICD-10-CM

## 2023-06-19 DIAGNOSIS — M6281 Muscle weakness (generalized): Secondary | ICD-10-CM
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
## 2023-06-21 ENCOUNTER — Other Ambulatory Visit: Payer: Self-pay | Admitting: Pharmacist

## 2023-06-21 DIAGNOSIS — L299 Pruritus, unspecified: Secondary | ICD-10-CM

## 2023-06-23 ENCOUNTER — Encounter: Payer: Self-pay | Admitting: Physical Therapy

## 2023-06-23 ENCOUNTER — Ambulatory Visit: Payer: Medicare Other | Admitting: Physical Therapy

## 2023-06-23 DIAGNOSIS — R269 Unspecified abnormalities of gait and mobility: Secondary | ICD-10-CM | POA: Diagnosis not present

## 2023-06-23 DIAGNOSIS — R2689 Other abnormalities of gait and mobility: Secondary | ICD-10-CM
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
## 2023-06-24 ENCOUNTER — Other Ambulatory Visit: Payer: Self-pay | Admitting: Pharmacist

## 2023-06-24 DIAGNOSIS — L299 Pruritus, unspecified: Secondary | ICD-10-CM

## 2023-06-25 ENCOUNTER — Ambulatory Visit: Payer: Medicare Other | Admitting: Physical Therapy

## 2023-06-25 DIAGNOSIS — R269 Unspecified abnormalities of gait and mobility: Secondary | ICD-10-CM | POA: Diagnosis not present

## 2023-06-25 DIAGNOSIS — M6281 Muscle weakness (generalized): Secondary | ICD-10-CM

## 2023-06-25 DIAGNOSIS — Z89512 Acquired absence of left leg below knee: Secondary | ICD-10-CM

## 2023-06-25 DIAGNOSIS — R2689 Other abnormalities of gait and mobility: Secondary | ICD-10-CM
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
## 2023-06-30 ENCOUNTER — Ambulatory Visit: Payer: Medicare Other | Admitting: Physical Therapy

## 2023-06-30 DIAGNOSIS — R269 Unspecified abnormalities of gait and mobility: Secondary | ICD-10-CM

## 2023-06-30 DIAGNOSIS — R2689 Other abnormalities of gait and mobility: Secondary | ICD-10-CM

## 2023-06-30 DIAGNOSIS — M6281 Muscle weakness (generalized): Secondary | ICD-10-CM

## 2023-06-30 DIAGNOSIS — Z89512 Acquired absence of left leg below knee: Secondary | ICD-10-CM
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
## 2023-07-01 ENCOUNTER — Telehealth: Payer: Self-pay | Admitting: Pharmacist

## 2023-07-01 DIAGNOSIS — L299 Pruritus, unspecified: Secondary | ICD-10-CM

## 2023-07-01 MED ORDER — HYDROXYZINE HCL 10 MG PO TABS
10.0000 mg | ORAL_TABLET | Freq: Three times a day (TID) | ORAL | 1 refills | Status: AC | PRN
Start: 2023-07-01 — End: ?

## 2023-07-01 NOTE — Telephone Encounter (Signed)
Patient's husband, [name], called requesting a refill on the hydroxyzine that Trinie takes as needed for itching. We prescribed this back in February as we thought her antibiotics were causing the itching. She has been off of antibiotics since July. He states that they have not found the source of itching. I advised that I will send in a refill today but to defer to her primary care's office for future refills. [name] agreed with the plan.  Madysyn Hanken L. Jannette Fogo, PharmD, BCIDP, AAHIVP, CPP Clinical Pharmacist Practitioner Infectious Diseases Clinical Pharmacist [facility] 07/01/2023, 11:30 AM

## 2023-07-02 ENCOUNTER — Ambulatory Visit: Payer: Medicare Other | Admitting: Physical Therapy

## 2023-07-02 DIAGNOSIS — R269 Unspecified abnormalities of gait and mobility: Secondary | ICD-10-CM | POA: Diagnosis not present

## 2023-07-02 DIAGNOSIS — Z89512 Acquired absence of left leg below knee: Secondary | ICD-10-CM

## 2023-07-02 DIAGNOSIS — M6281 Muscle weakness (generalized): Secondary | ICD-10-CM

## 2023-07-02 DIAGNOSIS — R2689 Other abnormalities of gait and mobility: Secondary | ICD-10-CM
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
## 2023-07-08 ENCOUNTER — Ambulatory Visit: Payer: Medicare Other | Attending: Internal Medicine | Admitting: Physical Therapy

## 2023-07-08 ENCOUNTER — Encounter: Payer: Self-pay | Admitting: Physical Therapy

## 2023-07-08 DIAGNOSIS — R269 Unspecified abnormalities of gait and mobility: Secondary | ICD-10-CM | POA: Diagnosis present

## 2023-07-08 DIAGNOSIS — Z89512 Acquired absence of left leg below knee: Secondary | ICD-10-CM | POA: Diagnosis present

## 2023-07-08 DIAGNOSIS — M6281 Muscle weakness (generalized): Secondary | ICD-10-CM | POA: Diagnosis present

## 2023-07-08 DIAGNOSIS — R2689 Other abnormalities of gait and mobility: Secondary | ICD-10-CM | POA: Insufficient documentation
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
## 2023-07-10 ENCOUNTER — Ambulatory Visit: Payer: Medicare Other | Admitting: Physical Therapy

## 2023-07-10 DIAGNOSIS — M6281 Muscle weakness (generalized): Secondary | ICD-10-CM

## 2023-07-10 DIAGNOSIS — R2689 Other abnormalities of gait and mobility: Secondary | ICD-10-CM

## 2023-07-10 DIAGNOSIS — R269 Unspecified abnormalities of gait and mobility: Secondary | ICD-10-CM | POA: Diagnosis not present
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
## 2023-07-14 ENCOUNTER — Ambulatory Visit: Payer: Medicare Other | Admitting: Physical Therapy

## 2023-07-16 ENCOUNTER — Ambulatory Visit: Payer: Medicare Other | Admitting: Physical Therapy

## 2023-07-16 DIAGNOSIS — R269 Unspecified abnormalities of gait and mobility: Secondary | ICD-10-CM | POA: Diagnosis not present

## 2023-07-16 DIAGNOSIS — Z89512 Acquired absence of left leg below knee: Secondary | ICD-10-CM

## 2023-07-16 DIAGNOSIS — R2689 Other abnormalities of gait and mobility: Secondary | ICD-10-CM

## 2023-07-16 DIAGNOSIS — M6281 Muscle weakness (generalized): Secondary | ICD-10-CM
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
## 2023-07-21 ENCOUNTER — Ambulatory Visit: Payer: Medicare Other | Admitting: Physical Therapy

## 2023-07-21 DIAGNOSIS — R269 Unspecified abnormalities of gait and mobility: Secondary | ICD-10-CM

## 2023-07-21 DIAGNOSIS — R2689 Other abnormalities of gait and mobility: Secondary | ICD-10-CM

## 2023-07-21 DIAGNOSIS — M6281 Muscle weakness (generalized): Secondary | ICD-10-CM

## 2023-07-21 DIAGNOSIS — Z89512 Acquired absence of left leg below knee: Secondary | ICD-10-CM
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
## 2023-07-23 ENCOUNTER — Ambulatory Visit: Payer: Medicare Other | Admitting: Physical Therapy

## 2023-07-23 DIAGNOSIS — R269 Unspecified abnormalities of gait and mobility: Secondary | ICD-10-CM | POA: Diagnosis not present

## 2023-07-23 DIAGNOSIS — Z89512 Acquired absence of left leg below knee: Secondary | ICD-10-CM

## 2023-07-23 DIAGNOSIS — R2689 Other abnormalities of gait and mobility: Secondary | ICD-10-CM

## 2023-07-23 DIAGNOSIS — M6281 Muscle weakness (generalized): Secondary | ICD-10-CM
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
## 2023-07-28 ENCOUNTER — Ambulatory Visit: Payer: Medicare Other | Admitting: Physical Therapy

## 2023-07-28 DIAGNOSIS — R269 Unspecified abnormalities of gait and mobility: Secondary | ICD-10-CM | POA: Diagnosis not present

## 2023-07-28 DIAGNOSIS — M6281 Muscle weakness (generalized): Secondary | ICD-10-CM

## 2023-07-28 DIAGNOSIS — R2689 Other abnormalities of gait and mobility: Secondary | ICD-10-CM

## 2023-07-28 DIAGNOSIS — Z89512 Acquired absence of left leg below knee: Secondary | ICD-10-CM
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
## 2023-07-30 ENCOUNTER — Ambulatory Visit: Payer: Medicare Other | Admitting: Physical Therapy

## 2023-08-04 ENCOUNTER — Encounter: Payer: Self-pay | Admitting: Physical Therapy

## 2023-08-04 ENCOUNTER — Ambulatory Visit: Payer: Medicare Other | Attending: Internal Medicine | Admitting: Physical Therapy

## 2023-08-04 DIAGNOSIS — Z89512 Acquired absence of left leg below knee: Secondary | ICD-10-CM | POA: Insufficient documentation

## 2023-08-04 DIAGNOSIS — M6281 Muscle weakness (generalized): Secondary | ICD-10-CM | POA: Insufficient documentation

## 2023-08-04 DIAGNOSIS — R2689 Other abnormalities of gait and mobility: Secondary | ICD-10-CM | POA: Insufficient documentation

## 2023-08-04 DIAGNOSIS — R269 Unspecified abnormalities of gait and mobility: Secondary | ICD-10-CM | POA: Insufficient documentation
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
## 2023-08-06 ENCOUNTER — Ambulatory Visit: Payer: Medicare Other | Admitting: Physical Therapy

## 2023-08-11 ENCOUNTER — Ambulatory Visit: Payer: Medicare Other | Admitting: Physical Therapy

## 2023-08-13 ENCOUNTER — Ambulatory Visit: Payer: Medicare Other | Admitting: Physical Therapy

## 2023-08-18 ENCOUNTER — Ambulatory Visit: Payer: Medicare Other | Admitting: Physical Therapy

## 2023-08-20 ENCOUNTER — Ambulatory Visit: Payer: Medicare Other | Admitting: Physical Therapy

## 2023-08-26 ENCOUNTER — Ambulatory Visit: Payer: Medicare Other | Admitting: Physical Therapy

## 2023-08-28 ENCOUNTER — Ambulatory Visit: Payer: Medicare Other | Admitting: Physical Therapy
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
## 2023-09-01 ENCOUNTER — Ambulatory Visit: Payer: Medicare Other | Admitting: Physical Therapy

## 2023-09-04 ENCOUNTER — Ambulatory Visit: Payer: Medicare Other | Admitting: Physical Therapy
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
## 2023-09-09 ENCOUNTER — Ambulatory Visit: Payer: Medicare Other | Admitting: Physical Therapy

## 2023-09-11 ENCOUNTER — Ambulatory Visit: Payer: Medicare Other | Admitting: Physical Therapy

## 2023-09-16 ENCOUNTER — Ambulatory Visit: Payer: Medicare Other | Attending: Internal Medicine | Admitting: Physical Therapy

## 2023-09-16 DIAGNOSIS — R2689 Other abnormalities of gait and mobility: Secondary | ICD-10-CM | POA: Diagnosis present

## 2023-09-16 DIAGNOSIS — M6281 Muscle weakness (generalized): Secondary | ICD-10-CM | POA: Diagnosis present

## 2023-09-16 DIAGNOSIS — R269 Unspecified abnormalities of gait and mobility: Secondary | ICD-10-CM | POA: Insufficient documentation

## 2023-09-16 DIAGNOSIS — Z89512 Acquired absence of left leg below knee: Secondary | ICD-10-CM | POA: Diagnosis present
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
## 2023-09-17 ENCOUNTER — Ambulatory Visit: Payer: Medicare Other | Admitting: Internal Medicine

## 2023-09-18 ENCOUNTER — Encounter: Payer: Self-pay | Admitting: Physical Therapy

## 2023-09-18 ENCOUNTER — Ambulatory Visit: Payer: Medicare Other | Admitting: Physical Therapy

## 2023-09-18 DIAGNOSIS — M6281 Muscle weakness (generalized): Secondary | ICD-10-CM

## 2023-09-18 DIAGNOSIS — R2689 Other abnormalities of gait and mobility: Secondary | ICD-10-CM

## 2023-09-18 DIAGNOSIS — R269 Unspecified abnormalities of gait and mobility: Secondary | ICD-10-CM | POA: Diagnosis not present
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
## 2023-09-23 ENCOUNTER — Ambulatory Visit: Payer: Medicare Other | Admitting: Physical Therapy

## 2023-09-23 DIAGNOSIS — R269 Unspecified abnormalities of gait and mobility: Secondary | ICD-10-CM | POA: Diagnosis not present

## 2023-09-23 DIAGNOSIS — M6281 Muscle weakness (generalized): Secondary | ICD-10-CM

## 2023-09-23 DIAGNOSIS — Z89512 Acquired absence of left leg below knee: Secondary | ICD-10-CM

## 2023-09-23 DIAGNOSIS — R2689 Other abnormalities of gait and mobility: Secondary | ICD-10-CM
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
## 2023-09-25 ENCOUNTER — Ambulatory Visit: Payer: Medicare Other | Admitting: Physical Therapy

## 2023-09-30 ENCOUNTER — Ambulatory Visit: Payer: Medicare Other | Admitting: Physical Therapy

## 2023-09-30 DIAGNOSIS — R269 Unspecified abnormalities of gait and mobility: Secondary | ICD-10-CM

## 2023-09-30 DIAGNOSIS — R2689 Other abnormalities of gait and mobility: Secondary | ICD-10-CM

## 2023-09-30 DIAGNOSIS — Z89512 Acquired absence of left leg below knee: Secondary | ICD-10-CM

## 2023-09-30 DIAGNOSIS — M6281 Muscle weakness (generalized): Secondary | ICD-10-CM
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
## 2023-10-07 ENCOUNTER — Ambulatory Visit: Payer: Medicare Other | Attending: Internal Medicine | Admitting: Physical Therapy

## 2023-10-07 DIAGNOSIS — M6281 Muscle weakness (generalized): Secondary | ICD-10-CM | POA: Diagnosis present

## 2023-10-07 DIAGNOSIS — R2689 Other abnormalities of gait and mobility: Secondary | ICD-10-CM | POA: Diagnosis present

## 2023-10-07 DIAGNOSIS — R269 Unspecified abnormalities of gait and mobility: Secondary | ICD-10-CM | POA: Insufficient documentation
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
## 2023-10-09 ENCOUNTER — Ambulatory Visit: Payer: Medicare Other | Admitting: Physical Therapy

## 2023-10-14 ENCOUNTER — Ambulatory Visit: Payer: Medicare Other | Admitting: Physical Therapy

## 2023-10-15 ENCOUNTER — Ambulatory Visit: Payer: Medicare Other | Admitting: Urology

## 2023-10-16 ENCOUNTER — Ambulatory Visit: Payer: Medicare Other | Admitting: Physical Therapy

## 2023-10-20 ENCOUNTER — Ambulatory Visit: Payer: Medicare Other | Admitting: Physician Assistant

## 2023-10-20 VITALS — BP 122/71 | HR 66 | Ht 59.0 in | Wt 126.0 lb

## 2023-10-20 DIAGNOSIS — R351 Nocturia: Secondary | ICD-10-CM | POA: Diagnosis not present

## 2023-10-20 DIAGNOSIS — R300 Dysuria: Secondary | ICD-10-CM | POA: Diagnosis not present

## 2023-10-20 DIAGNOSIS — R32 Unspecified urinary incontinence: Secondary | ICD-10-CM

## 2023-10-20 MED ORDER — GEMTESA 75 MG PO TABS
75.0000 mg | ORAL_TABLET | Freq: Every day | ORAL | Status: DC
Start: 2023-10-20 — End: 2024-01-30

## 2023-10-20 NOTE — Patient Instructions (Signed)
Please get in contact with the doctor who manages your diabetes and see if they can clarify which medications you should be taking, and communicate this with [facility] to make sure you are getting the right medications. Start using your CPAP at night consistently. See if you are taking Myrbetriq (mirabegron). If you are, stop this. Start Gemtesa (vibegron) samples, one pill daily at nighttime.

## 2023-10-20 NOTE — Progress Notes (Unsigned)
10/20/2023 3:02 PM   Cassandra [name] [birth year] [id number]  CC: Chief Complaint  Patient presents with   Recurrent UTI   HPI: Cassandra [name] is a 89 y.o. female with PMH nephrolithiasis, OAB wet, diabetes, OSA not on CPAP, and CHF on who previously failed Myrbetriq 25 mg who presents today for evaluation of urinary leakage.   Today she reports bothersome urinary leakage, sometimes without awareness; nocturia; intermittent dysuria, none presently; and a jellylike substance comes out with her urine and congealed if left in her Depends.  She denies gross hematuria.  She gets her prescriptions in blister packs and individual bottles from [facility] and is unsure what she's currently taking. I spoke to [facility] staff; there remain significant discrepancies between reported and dispensed meds, including that she may not be on any diabetes medications at this time.  There are some notes that she is currently on Myrbetriq, but she is not sure if this is true.  Notably, there was an error in scheduling today's visit.  She has not been seen in our clinic in more than 3 years.  She is considered a new patient today.  In-office UA today positive for 3+ glucose and 1+ leukocytes; urine microscopy with >30 WBCs/HPF and moderate bacteria.  PMH: Past Medical History:  Diagnosis Date   Acquired hypothyroidism 01/06/2014   Anxiety    Arthritis    B12 deficiency 02/15/2014   Benign essential hypertension 01/06/2014   Breast cancer (HCC) 1982   Right breast cancer - chemotherapy   CAD (coronary artery disease), native coronary artery 01/06/2014   Carotid artery calcification    Chronic airway obstruction, not elsewhere classified 01/06/2014   Chronic mesenteric ischemia (HCC)    s/p SMA stent 11/03/19   Depression    Diabetes mellitus without complication (HCC)    Dysrhythmia    aflutter with RVR 06/2016   History of kidney stones 2019   left ureteral stone   Incontinence of  urine    Myocardial infarction (HCC) 1982   small infarct   Neuromuscular disorder (HCC)    restless legs   Pacemaker    Presence of permanent cardiac pacemaker 07/2016   Pure hypercholesterolemia 01/06/2014   Renal insufficiency    Skin cancer    Sleep apnea    no longer uses a cpap. Uses oxygen as needed    Surgical History: Past Surgical History:  Procedure Laterality Date   AMPUTATION Left 01/03/2023   Procedure: AMPUTATION BELOW KNEE;  Surgeon: Roby Lofts, MD;  Location: [facility];  Service: Orthopedics;  Laterality: Left;   ANKLE FUSION Left 10/10/2021   Procedure: ANKLE FUSION;  Surgeon: Roby Lofts, MD;  Location: [facility];  Service: Orthopedics;  Laterality: Left;   APPENDECTOMY     APPLICATION OF WOUND VAC Left 02/24/2021   Procedure: APPLICATION OF WOUND VAC;  Surgeon: Samson Frederic, MD;  Location: [facility];  Service: Orthopedics;  Laterality: Left;   AUGMENTATION MAMMAPLASTY Bilateral 1982/redo in 2014   prior mastectomy   CARDIAC CATHETERIZATION N/A 04/23/2016   Procedure: Left Heart Cath and Coronary Angiography;  Surgeon: Lamar Blinks, MD;  Location: [facility];  Service: Cardiovascular;  Laterality: N/A;   CARDIAC CATHETERIZATION Left 04/23/2016   Procedure: Coronary Stent Intervention;  Surgeon: Alwyn Pea, MD;  Location: [facility];  Service: Cardiovascular;  Laterality: Left;   CAROTID ARTERY ANGIOPLASTY Left 2010   had stent inserted and removed d/t infection. artery from  leg inserted in left carotid   CAROTID ENDARTERECTOMY Left 2010   left CEA ~ 2010, s/p excision infected left carotid patch & pseudoaneurysm with left CCA-ICA bypass using SVG 05/20/12   CHOLECYSTECTOMY     CORONARY ANGIOPLASTY WITH STENT PLACEMENT  september 9th 2017   CYSTOSCOPY W/ URETERAL STENT PLACEMENT Left 08/17/2017   Procedure: CYSTOSCOPY WITH RETROGRADE PYELOGRAM/URETERAL STENT PLACEMENT;  Surgeon: Jerilee Field, MD;  Location: [facility];  Service:  Urology;  Laterality: Left;   CYSTOSCOPY W/ URETERAL STENT PLACEMENT Left 09/16/2017   Procedure: CYSTOSCOPY WITH STENT REPLACEMENT;  Surgeon: [name], MD;  Location: [facility];  Service: Urology;  Laterality: Left;   CYSTOSCOPY/RETROGRADE/URETEROSCOPY/STONE EXTRACTION WITH BASKET Left 09/16/2017   Procedure: CYSTOSCOPY/RETROGRADE/URETEROSCOPY/STONE EXTRACTION WITH BASKET;  Surgeon: [name], MD;  Location: [facility];  Service: Urology;  Laterality: Left;   EXTERNAL FIXATION LEG Left 02/24/2021   Procedure: EXTERNAL FIXATION  ANKLE;  Surgeon: Samson Frederic, MD;  [facility];  Service: Orthopedics;  Laterality: Left;   EXTERNAL FIXATION REMOVAL Left 02/26/2021   Procedure: REMOVAL EXTERNAL FIXATION LEG;  Surgeon: Roby Lofts, MD;  Location: [facility];  Service: Orthopedics;  Laterality: Left;   EYE SURGERY Bilateral    cataract extraction   I & D EXTREMITY Left 02/24/2021   Procedure: IRRIGATION AND DEBRIDEMENT EXTREMITY;  Surgeon: Samson Frederic, MD;  [facility]: [facility];  Service: Orthopedics;  Laterality: Left;   I & D EXTREMITY Left 12/21/2021   Procedure: IRRIGATION AND DEBRIDEMENT LEFT ANKLE;  Surgeon: Roby Lofts, MD;  Location: [facility];  Service: Orthopedics;  Laterality: Left;   I & D EXTREMITY Left 05/03/2022   Procedure: DEBRIDEMENT OF LEFT ANKLE INFECTION;  Surgeon: Roby Lofts, MD;  Location: [facility];  Service: Orthopedics;  Laterality: Left;   IR FLUORO GUIDE CV LINE LEFT  12/27/2021   IR FLUORO GUIDE CV LINE RIGHT  10/12/2021   IR REMOVAL TUN CV CATH W/O FL  11/22/2021   IR REMOVAL TUN CV CATH W/O FL  10/10/2022   IR US GUIDE VASC ACCESS LEFT  12/27/2021   IR US GUIDE VASC ACCESS RIGHT  10/12/2021   KYPHOPLASTY N/A 02/03/2018   Procedure: [id number];  Surgeon: Kennedy Bucker, MD;  Location: [facility];  Service: Orthopedics;  Laterality: N/A;   MASTECTOMY Right 1982   ORIF ANKLE FRACTURE Left 02/26/2021   Procedure: OPEN REDUCTION INTERNAL FIXATION (ORIF) ANKLE  FRACTURE;  Surgeon: Roby Lofts, MD;  Location: [facility];  Service: Orthopedics;  Laterality: Left;   PACEMAKER INSERTION Left 07/03/2016   Procedure: INSERTION PACEMAKER;  Surgeon: Marcina Millard, MD;  Location: [facility];  Service: Cardiovascular;  Laterality: Left;   SKIN CANCER EXCISION     TONSILLECTOMY     TUBAL LIGATION     VISCERAL ANGIOGRAPHY N/A 11/03/2019   Procedure: VISCERAL ANGIOGRAPHY;  Surgeon: Renford Dills, MD;  Location: [facility];  Service: Cardiovascular;  Laterality: N/A;    Home Medications:  Allergies as of 10/20/2023       Reactions   Baclofen Other (See Comments)   Hallucinations   Levofloxacin Other (See Comments)   Hallucinations         Medication List        Accurate as of October 20, 2023  3:02 PM. If you have any questions, ask your nurse or doctor.          acetaminophen 325 MG tablet Commonly known as: TYLENOL Take 1-2 tablets (325-650 mg total)  by mouth every 6 (six) hours as needed for mild pain (pain score 1-3 or temp > 100.5).   amitriptyline 10 MG tablet Commonly known as: ELAVIL Take 1 tablet by mouth at bedtime.   apixaban 2.5 MG Tabs tablet Commonly known as: ELIQUIS Take 1 tablet (2.5 mg total) by mouth 2 (two) times daily.   clopidogrel 75 MG tablet Commonly known as: PLAVIX Take 1 tablet (75 mg total) by mouth daily.   desvenlafaxine 50 MG 24 hr tablet Commonly known as: PRISTIQ Take 100 mg by mouth every evening.   docusate sodium 100 MG capsule Commonly known as: COLACE Take 1 capsule (100 mg total) by mouth 2 (two) times daily.   gabapentin 100 MG capsule Commonly known as: NEURONTIN Take 1 capsule (100 mg total) by mouth 2 (two) times daily as needed (severe phantom limb pain). What changed: when to take this   gabapentin 100 MG capsule Commonly known as: NEURONTIN Take 1 capsule by mouth 3 (three) times daily. What changed: Another medication with the same name was changed. Make sure  you understand how and when to take each.   hydrOXYzine 10 MG tablet Commonly known as: ATARAX Take 1 tablet (10 mg total) by mouth 3 (three) times daily as needed.   levothyroxine 112 MCG tablet Commonly known as: SYNTHROID Take by mouth.   Melatonin 10 MG Caps Take 10 mg by mouth at bedtime as needed (sleep).   metFORMIN 500 MG tablet Commonly known as: GLUCOPHAGE Take 1 tablet (500 mg total) by mouth 2 (two) times daily with a meal.   methocarbamol 500 MG tablet Commonly known as: ROBAXIN Take 1 tablet (500 mg total) by mouth every 6 (six) hours as needed for muscle spasms.   metoprolol tartrate 25 MG tablet Commonly known as: LOPRESSOR Take 1 tablet (25 mg total) by mouth 2 (two) times daily.   mirabegron ER 50 MG Tb24 tablet Commonly known as: MYRBETRIQ Take 1 tablet by mouth daily.   multivitamin with minerals Tabs tablet Take 1 tablet by mouth daily.   nitroGLYCERIN 0.4 MG SL tablet Commonly known as: NITROSTAT Place 1 tablet (0.4 mg total) under the tongue every 5 (five) minutes as needed for chest pain.   pregabalin 25 MG capsule Commonly known as: LYRICA Take 1 capsule (25 mg total) by mouth at bedtime.   rosuvastatin 10 MG tablet Commonly known as: CRESTOR Take 10 mg by mouth at bedtime.   torsemide 20 MG tablet Commonly known as: DEMADEX Take 1 tablet (20 mg total) by mouth daily.   traMADol 50 MG tablet Commonly known as: ULTRAM Take 1 tablet (50 mg total) by mouth every 6 (six) hours as needed for moderate pain or severe pain.   Vitamin D (Ergocalciferol) 1.25 MG (50000 UNIT) Caps capsule Commonly known as: DRISDOL Take 1 capsule (50,000 Units total) by mouth every 7 (seven) days. Once a week on Thursdays   VITAMIN D PO Take 1 capsule by mouth daily.        Allergies:  Allergies  Allergen Reactions   Baclofen Other (See Comments)    Hallucinations   Levofloxacin Other (See Comments)    Hallucinations     Family History: Family  History  Problem Relation Age of Onset   Prostate cancer Neg Hx    Kidney cancer Neg Hx    Breast cancer Neg Hx     Social History:   reports that she quit smoking about 69 years ago. Her smoking use included cigarettes.  She started smoking about 70 years ago. She has a 0.5 pack-year smoking history. She has been exposed to tobacco smoke. She has never used smokeless tobacco. She reports current alcohol use. She reports that she does not currently use drugs.  Physical Exam: BP 122/71   Pulse 66   Ht 4\' 11"  (1.499 m)   Wt 126 lb (57.2 kg)   LMP  (LMP Unknown) Comment: age 35  BMI 25.45 kg/m   Constitutional:  Alert and oriented, no acute distress, nontoxic appearing HEENT: [place], AT Cardiovascular: No clubbing, cyanosis, or edema Respiratory: Normal respiratory effort, no increased work of breathing Skin: No rashes, bruises or suspicious lesions Neurologic: Grossly intact, no focal deficits, moving all 4 extremities Psychiatric: Normal mood and affect  Laboratory Data: Results for orders placed or performed in visit on 10/20/23  Microscopic Examination   Collection Time: 10/20/23  2:34 PM   Urine  Result Value Ref Range   WBC, UA >30 (A) 0 - 5 /hpf   RBC, Urine 0-2 0 - 2 /hpf   Epithelial Cells (non renal) 0-10 0 - 10 /hpf   Bacteria, UA Moderate (A) None seen/Few  Urinalysis, Complete   Collection Time: 10/20/23  2:34 PM  Result Value Ref Range   Specific Gravity, UA 1.020 1.005 - 1.030   pH, UA 5.0 5.0 - 7.5   Color, UA Yellow Yellow   Appearance Ur Cloudy (A) Clear   Leukocytes,UA 1+ (A) Negative   Protein,UA Negative Negative/Trace   Glucose, UA 3+ (A) Negative   Ketones, UA Negative Negative   RBC, UA Negative Negative   Bilirubin, UA Negative Negative   Urobilinogen, Ur 0.2 0.2 - 1.0 mg/dL   Nitrite, UA Negative Negative   Microscopic Examination See below:    Assessment & Plan:   1. Nocturia (Primary) We discussed the role of unmanaged sleep apnea and  nocturia and I encouraged her to start using her CPAP on a regular basis.  She expressed understanding.  2. Urinary incontinence, unspecified type Unclear if she is currently on Myrbetriq.  If she is, I would like her to stop it.  I gave her samples of Gemtesa and would like her to take these at nighttime to try to help with her nocturia, though I was clear with her that consistent CPAP use should make a greater difference.  She also has significant glucosuria today likely secondary to uncontrolled diabetes.  We discussed that this is likely worsening her voiding symptoms.  I encouraged her to follow-up with her diabetes management team to clarify her drug regimen and make sure this gets communicated to her pharmacy. - Vibegron (GEMTESA) 75 MG TABS; Take 1 tablet (75 mg total) by mouth daily.  3. Dysuria Intermittent, none currently.  Her UA is positive, suspect she is chronically colonized.  May consider estrogen cream in the future. - Urinalysis, Complete   Return in about 6 weeks (around 12/01/2023) for Symptom recheck with PVR with Dr. Lonna Cobb.  Carman Ching, PA-C  [facility] [place] [address], [address] [place], [place] [address] [phone]

## 2023-10-21 ENCOUNTER — Ambulatory Visit: Payer: Medicare Other | Admitting: Physical Therapy

## 2023-10-21 DIAGNOSIS — M6281 Muscle weakness (generalized): Secondary | ICD-10-CM

## 2023-10-21 DIAGNOSIS — R269 Unspecified abnormalities of gait and mobility: Secondary | ICD-10-CM | POA: Diagnosis not present

## 2023-10-21 DIAGNOSIS — R2689 Other abnormalities of gait and mobility: Secondary | ICD-10-CM

## 2023-10-21 LAB — URINALYSIS, COMPLETE
Bilirubin, UA: NEGATIVE
Ketones, UA: NEGATIVE
Nitrite, UA: NEGATIVE
Protein,UA: NEGATIVE
RBC, UA: NEGATIVE
Specific Gravity, UA: 1.02 (ref 1.005–1.030)
Urobilinogen, Ur: 0.2 mg/dL (ref 0.2–1.0)
pH, UA: 5 (ref 5.0–7.5)

## 2023-10-21 LAB — MICROSCOPIC EXAMINATION: WBC, UA: >30 /[HPF] — AB (ref 0–5)
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
## 2023-10-23 ENCOUNTER — Ambulatory Visit: Payer: Medicare Other | Admitting: Physical Therapy

## 2023-10-24 ENCOUNTER — Other Ambulatory Visit: Payer: Self-pay | Admitting: Pharmacist

## 2023-10-24 DIAGNOSIS — L299 Pruritus, unspecified: Secondary | ICD-10-CM

## 2023-10-24 NOTE — Telephone Encounter (Signed)
Future refills need to come from PCP per [facility].   Sandie Ano, RN

## 2023-10-28 ENCOUNTER — Ambulatory Visit: Payer: Medicare Other | Admitting: Physical Therapy

## 2023-10-28 DIAGNOSIS — R269 Unspecified abnormalities of gait and mobility: Secondary | ICD-10-CM

## 2023-10-28 DIAGNOSIS — R2689 Other abnormalities of gait and mobility: Secondary | ICD-10-CM

## 2023-10-28 DIAGNOSIS — M6281 Muscle weakness (generalized): Secondary | ICD-10-CM
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
## 2023-10-29 ENCOUNTER — Other Ambulatory Visit: Payer: Self-pay | Admitting: Pharmacist

## 2023-10-29 DIAGNOSIS — L299 Pruritus, unspecified: Secondary | ICD-10-CM

## 2023-10-30 ENCOUNTER — Ambulatory Visit: Payer: Medicare Other | Admitting: Physical Therapy

## 2023-11-04 ENCOUNTER — Ambulatory Visit: Payer: Medicare Other | Attending: Internal Medicine | Admitting: Physical Therapy

## 2023-11-04 DIAGNOSIS — R2689 Other abnormalities of gait and mobility: Secondary | ICD-10-CM | POA: Diagnosis present

## 2023-11-04 DIAGNOSIS — R269 Unspecified abnormalities of gait and mobility: Secondary | ICD-10-CM | POA: Diagnosis present

## 2023-11-04 DIAGNOSIS — M6281 Muscle weakness (generalized): Secondary | ICD-10-CM | POA: Diagnosis present
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
## 2023-11-06 ENCOUNTER — Ambulatory Visit: Payer: Medicare Other | Admitting: Physical Therapy

## 2023-11-10 ENCOUNTER — Other Ambulatory Visit: Payer: Self-pay

## 2023-11-10 DIAGNOSIS — I10 Essential (primary) hypertension: Secondary | ICD-10-CM | POA: Diagnosis not present

## 2023-11-10 DIAGNOSIS — E119 Type 2 diabetes mellitus without complications: Secondary | ICD-10-CM | POA: Diagnosis not present

## 2023-11-10 DIAGNOSIS — N179 Acute kidney failure, unspecified: Secondary | ICD-10-CM | POA: Insufficient documentation

## 2023-11-10 DIAGNOSIS — N3001 Acute cystitis with hematuria: Secondary | ICD-10-CM | POA: Diagnosis not present

## 2023-11-10 DIAGNOSIS — R1030 Lower abdominal pain, unspecified: Secondary | ICD-10-CM | POA: Diagnosis present

## 2023-11-10 LAB — URINALYSIS, ROUTINE W REFLEX MICROSCOPIC
Bilirubin Urine: NEGATIVE
Glucose, UA: >=500 mg/dL — AB
Ketones, ur: NEGATIVE mg/dL
Nitrite: POSITIVE — AB
Protein, ur: 30 mg/dL — AB
RBC / HPF: >50 RBC/hpf (ref 0–5)
Specific Gravity, Urine: 1.021 (ref 1.005–1.030)
Squamous Epithelial / HPF: 0 /HPF (ref 0–5)
WBC, UA: >50 WBC/hpf (ref 0–5)
pH: 5 (ref 5.0–8.0)

## 2023-11-10 LAB — CBC WITH DIFFERENTIAL/PLATELET
Abs Immature Granulocytes: 0.07 10*3/uL (ref 0.00–0.07)
Basophils Absolute: 0.1 10*3/uL (ref 0.0–0.1)
Basophils Relative: 1 %
Eosinophils Absolute: 0.4 10*3/uL (ref 0.0–0.5)
Eosinophils Relative: 3 %
HCT: 33.3 % — ABNORMAL LOW (ref 36.0–46.0)
Hemoglobin: 10.3 g/dL — ABNORMAL LOW (ref 12.0–15.0)
Immature Granulocytes: 1 %
Lymphocytes Relative: 16 %
Lymphs Abs: 2.2 10*3/uL (ref 0.7–4.0)
MCH: 23.8 pg — ABNORMAL LOW (ref 26.0–34.0)
MCHC: 30.9 g/dL (ref 30.0–36.0)
MCV: 77.1 fL — ABNORMAL LOW (ref 80.0–100.0)
Monocytes Absolute: 1.3 10*3/uL — ABNORMAL HIGH (ref 0.1–1.0)
Monocytes Relative: 9 %
Neutro Abs: 9.6 10*3/uL — ABNORMAL HIGH (ref 1.7–7.7)
Neutrophils Relative %: 70 %
Platelets: 250 10*3/uL (ref 150–400)
RBC: 4.32 MIL/uL (ref 3.87–5.11)
RDW: 16.7 % — ABNORMAL HIGH (ref 11.5–15.5)
WBC: 13.6 10*3/uL — ABNORMAL HIGH (ref 4.0–10.5)
nRBC: 0 % (ref 0.0–0.2)

## 2023-11-10 LAB — LIPASE, BLOOD: Lipase: 26 U/L (ref 11–51)

## 2023-11-10 LAB — COMPREHENSIVE METABOLIC PANEL
ALT: 9 U/L (ref 0–44)
AST: 12 U/L — ABNORMAL LOW (ref 15–41)
Albumin: 3 g/dL — ABNORMAL LOW (ref 3.5–5.0)
Alkaline Phosphatase: 72 U/L (ref 38–126)
Anion gap: 8 (ref 5–15)
BUN: 28 mg/dL — ABNORMAL HIGH (ref 8–23)
CO2: 21 mmol/L — ABNORMAL LOW (ref 22–32)
Calcium: 8.5 mg/dL — ABNORMAL LOW (ref 8.9–10.3)
Chloride: 104 mmol/L (ref 98–111)
Creatinine, Ser: 1.44 mg/dL — ABNORMAL HIGH (ref 0.44–1.00)
GFR, Estimated: 35 mL/min — ABNORMAL LOW (ref >60)
Glucose, Bld: 448 mg/dL — ABNORMAL HIGH (ref 70–99)
Potassium: 4.4 mmol/L (ref 3.5–5.1)
Sodium: 133 mmol/L — ABNORMAL LOW (ref 135–145)
Total Bilirubin: 0.5 mg/dL (ref 0.0–1.2)
Total Protein: 7 g/dL (ref 6.5–8.1)

## 2023-11-10 NOTE — ED Provider Triage Note (Signed)
 Emergency Medicine Provider Triage Evaluation Note  Cassandra [name] , a 89 y.o. female  was evaluated in triage.  Pt complains of urinary frequency and dysuria. Abd pain but no flank pain. No diarrhea or constipation.  Review of Systems  Positive: Dysuria, frequency, low abd pain Negative: Diarrhea, constipation  Physical Exam  Ht 4\' 11"  (1.499 m)   Wt 57.2 kg   LMP  (LMP Unknown) Comment: age 24  BMI 25.45 kg/m  Gen:   Awake, no distress   Resp:  Normal effort  MSK:   Moves extremities without difficulty  Other:    Medical Decision Making  Medically screening exam initiated at 9:42 PM.  Appropriate orders placed.  Cassandra [name] was informed that the remainder of the evaluation will be completed by another provider, this initial triage assessment does not replace that evaluation, and the importance of remaining in the ED until their evaluation is complete.  Labs, urinalysis   Racheal Patches, PA-C 11/10/23 2142

## 2023-11-10 NOTE — ED Triage Notes (Addendum)
 Pt reports she has had abd discomfort and increased urine output and dysuria for the past 3 days. Pt also reports noticing some blood coming from her vagina today.

## 2023-11-11 ENCOUNTER — Emergency Department

## 2023-11-11 ENCOUNTER — Ambulatory Visit: Payer: Medicare Other | Admitting: Physical Therapy

## 2023-11-11 ENCOUNTER — Emergency Department
Admission: EM | Admit: 2023-11-11 | Discharge: 2023-11-11 | Disposition: A | Discharge status: Home / Self Care | Attending: Emergency Medicine | Admitting: Emergency Medicine

## 2023-11-11 DIAGNOSIS — N3001 Acute cystitis with hematuria: Secondary | ICD-10-CM

## 2023-11-11 DIAGNOSIS — N179 Acute kidney failure, unspecified: Secondary | ICD-10-CM

## 2023-11-11 LAB — CBG MONITORING, ED: Glucose-Capillary: 191 mg/dL — ABNORMAL HIGH (ref 70–99)

## 2023-11-11 MED ORDER — IOHEXOL 9 MG/ML PO SOLN
500.0000 mL | ORAL | Status: AC
  Administered 2023-11-11: 500 mL via ORAL
  Filled 2023-11-11 (×2): qty 500

## 2023-11-11 MED ORDER — IOHEXOL 300 MG/ML  SOLN
75.0000 mL | Freq: Once | INTRAMUSCULAR | Status: AC | PRN
  Administered 2023-11-11: 75 mL via INTRAVENOUS

## 2023-11-11 MED ORDER — CEFUROXIME AXETIL 500 MG PO TABS: 500.0000 mg | ORAL_TABLET | Freq: Two times a day (BID) | ORAL | 0 refills | Status: AC

## 2023-11-11 MED ORDER — SODIUM CHLORIDE 0.9 % IV BOLUS
1000.0000 mL | Freq: Once | INTRAVENOUS | Status: AC
  Administered 2023-11-11: 1000 mL via INTRAVENOUS

## 2023-11-11 MED ORDER — SODIUM CHLORIDE 0.9 % IV SOLN
2.0000 g | Freq: Once | INTRAVENOUS | Status: AC
  Administered 2023-11-11: 2 g via INTRAVENOUS
  Filled 2023-11-11: qty 20

## 2023-11-11 MED ORDER — MORPHINE SULFATE (PF) 4 MG/ML IV SOLN
4.0000 mg | Freq: Once | INTRAVENOUS | Status: AC
  Administered 2023-11-11: 4 mg via INTRAVENOUS
  Filled 2023-11-11: qty 1

## 2023-11-11 NOTE — ED Provider Notes (Signed)
 Emergency department handoff note  Care of this patient was signed out to me at the end of the previous provider shift.  All pertinent patient information was conveyed and all questions were answered.  Patient pending results from CT abdomen pelvis that did not show any evidence of acute abnormalities.  Patient will be treated for unspecified level of urinary tract infection with Ceftin. The patient has been reexamined and is ready to be discharged.  All diagnostic results have been reviewed and discussed with the patient/family.  Care plan has been outlined and the patient/family understands all current diagnoses, results, and treatment plans.  There are no new complaints, changes, or physical findings at this time.  All questions have been addressed and answered.  Patient was instructed to, and agrees to follow-up with their primary care physician as well as return to the emergency department if any new or worsening symptoms develop.   Merwyn Katos, MD 11/11/23 1049

## 2023-11-11 NOTE — ED Notes (Signed)
 Incontinence care completed prior to dc.  Patient only wears oxygen at home when needed.

## 2023-11-11 NOTE — Inpatient Diabetes Management (Signed)
 Inpatient Diabetes Program Recommendations  AACE/ADA: New Consensus Statement on Inpatient Glycemic Control   Target Ranges:  Prepandial:   less than 140 mg/dL      Peak postprandial:   less than 180 mg/dL (1-2 hours)      Critically ill patients:  140 - 180 mg/dL    Latest Reference Range & Units 11/10/23 21:44  CO2 22 - 32 mmol/L 21 (L)  Glucose 70 - 99 mg/dL 952 (H)  Anion gap 5 - 15  8    Review of Glycemic Control  Diabetes history: DM2 Outpatient Diabetes medications: Lantus 8 units at bedtime, Humalog 5-8 units TID with meals (per Endocrinology note on 06/11/23) Current orders for Inpatient glycemic control: None; in ED  Inpatient Diabetes Program Recommendations:    Insulin: While holding in ED please consider ordering CBGs Q4H and Novolog 0-9 units Q4H.  NOTE: Patient in ED with abdominal pain and dysuria. Patient has hx of DM2 and per chart review, noted patient sees Dr. Gershon Crane (Endocrinologist) and was last seen on 06/11/23. Per office note on 06/11/23, patient is prescribed Lantus 8 units at bedtime and Humalog 5-8 units TID with meals.  Thanks, Orlando Penner, RN, MSN, CDCES Diabetes Coordinator Inpatient Diabetes Program [phone] (Team Pager from 8am to 5pm)

## 2023-11-11 NOTE — ED Notes (Signed)
 Patient brief changed.
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
## 2023-11-11 NOTE — ED Notes (Signed)
 Incontinence care provided with complete bed change

## 2023-11-11 NOTE — ED Provider Notes (Signed)
 [facility] Provider Note    Event Date/Time   First MD Initiated Contact with Patient 11/11/23 0241     (approximate)   History   Abdominal Pain   HPI Cassandra [name] is a 89 y.o. female with history of DM2, frequent UTIs, HTN, ureter obstruction presenting today for lower abdominal pain and dysuria.  Patient states she has had lower abdominal pain and dysuria symptoms for the past 3 days.  She felt like there was blood in her urine starting today.  Mild nausea but no vomiting.  Otherwise denies fever, cough, congestion, vomiting, chest pain, diarrhea, constipation.  Prior UTIs which have felt similar to this.     Physical Exam   Triage Vital Signs: ED Triage Vitals  Encounter Vitals Group     BP 11/10/23 2143 (!) 149/46     Systolic BP Percentile --      Diastolic BP Percentile --      Pulse Rate 11/10/23 2143 71     Resp 11/10/23 2143 20     Temp 11/10/23 2143 (!) 97.4 F (36.3 C)     Temp Source 11/10/23 2143 Oral     SpO2 11/10/23 2143 94 %     Weight 11/10/23 2141 126 lb (57.2 kg)     Height 11/10/23 2141 4\' 11"  (1.499 m)     Head Circumference --      Peak Flow --      Pain Score 11/10/23 2141 0     Pain Loc --      Pain Education --      Exclude from Growth Chart --     Most recent vital signs: Vitals:   11/11/23 0305 11/11/23 0454  BP:  (!) 152/62  Pulse: 68 83  Resp:  20  Temp:  (!) 97.4 F (36.3 C)  SpO2: 91% 100%   Physical Exam: I have reviewed the vital signs and nursing notes. General: Awake, alert, no acute distress.  Nontoxic appearing. Head:  Atraumatic, normocephalic.   ENT:  EOM intact, PERRL. Oral mucosa is pink and moist with no lesions. Neck: Neck is supple with full range of motion, No meningeal signs. Cardiovascular:  RRR, No murmurs. Peripheral pulses palpable and equal bilaterally. Respiratory:  Symmetrical chest wall expansion.  No rhonchi, rales, or wheezes.  Good air movement throughout.  No use of  accessory muscles.   Musculoskeletal:  No cyanosis or edema. Moving extremities with full ROM Abdomen:  Soft, sharp tenderness to palpation in the suprapubic and left lower quadrant, nondistended. Neuro:  GCS 15, moving all four extremities, interacting appropriately. Speech clear. Psych:  Calm, appropriate.   Skin:  Warm, dry, no rash.    ED Results / Procedures / Treatments   Labs (all labs ordered are listed, but only abnormal results are displayed) Labs Reviewed  COMPREHENSIVE METABOLIC PANEL - Abnormal; Notable for the following components:      Result Value   Sodium 133 (*)    CO2 21 (*)    Glucose, Bld 448 (*)    BUN 28 (*)    Creatinine, Ser 1.44 (*)    Calcium 8.5 (*)    Albumin 3.0 (*)    AST 12 (*)    GFR, Estimated 35 (*)    All other components within normal limits  CBC WITH DIFFERENTIAL/PLATELET - Abnormal; Notable for the following components:   WBC 13.6 (*)    Hemoglobin 10.3 (*)    HCT 33.3 (*)  MCV 77.1 (*)    MCH 23.8 (*)    RDW 16.7 (*)    Neutro Abs 9.6 (*)    Monocytes Absolute 1.3 (*)    All other components within normal limits  URINALYSIS, ROUTINE W REFLEX MICROSCOPIC - Abnormal; Notable for the following components:   Color, Urine YELLOW (*)    APPearance CLOUDY (*)    Glucose, UA >=500 (*)    Hgb urine dipstick LARGE (*)    Protein, ur 30 (*)    Nitrite POSITIVE (*)    Leukocytes,Ua LARGE (*)    Bacteria, UA RARE (*)    All other components within normal limits  LIPASE, BLOOD     EKG    RADIOLOGY CT pending at time of signout   PROCEDURES:  Critical Care performed: No  Procedures   MEDICATIONS ORDERED IN ED: Medications  iohexol (OMNIPAQUE) 9 MG/ML oral solution 500 mL (500 mLs Oral Contrast Given 11/11/23 0327)  sodium chloride 0.9 % bolus 1,000 mL (0 mLs Intravenous Stopped 11/11/23 0454)  cefTRIAXone (ROCEPHIN) 2 g in sodium chloride 0.9 % 100 mL IVPB (0 g Intravenous Stopped 11/11/23 0353)  morphine (PF) 4 MG/ML  injection 4 mg (4 mg Intravenous Given 11/11/23 0323)  iohexol (OMNIPAQUE) 300 MG/ML solution 75 mL (75 mLs Intravenous Contrast Given 11/11/23 0543)     IMPRESSION / MDM / ASSESSMENT AND PLAN / ED COURSE  I reviewed the triage vital signs and the nursing notes.                              Differential diagnosis includes, but is not limited to, acute cystitis, pyelonephritis, nephrolithiasis, diverticulitis, colitis, obstruction  Patient's presentation is most consistent with acute complicated illness / injury requiring diagnostic workup.  Patient is an 89 year old female presenting today for dysuria and lower abdominal pain symptoms.  Vital signs are stable but exam does show severe tenderness to palpation in the suprapubic and left lower quadrant.  She does have a UA indicating UTI but with her significant tenderness palpation left lower quadrant, will get CT to rule out further pathology such as pyelonephritis or diverticulitis.  CMP shows mild AKI as well as hyperglycemia.  No evidence of DKA.  Mild leukocytosis but patient not meeting sepsis criteria otherwise.  Will give 1 L fluids, ceftriaxone, Zofran, and morphine for symptomatic treatment and reassess following CT. patient signed out to oncoming provider pending results of CT.  She is otherwise stable and not septic from her UTI.  Suspect if CT is negative, patient can be discharged with oral antibiotics and outpatient follow-up with her PCP.     FINAL CLINICAL IMPRESSION(S) / ED DIAGNOSES   Final diagnoses:  Acute cystitis with hematuria  AKI (acute kidney injury) (HCC)     Rx / DC Orders   ED Discharge Orders          Ordered    cefUROXime (CEFTIN) 500 MG tablet  2 times daily with meals        11/11/23 0659             Note:  This document was prepared using Dragon voice recognition software and may include unintentional dictation errors.   Janith Lima, MD 11/11/23 [id number]

## 2023-11-11 NOTE — Discharge Instructions (Addendum)
 You have a urinary tract infection today which is likely the source of all your symptoms.  I have sent antibiotics to your pharmacy to continue treating this.  Make sure you are staying hydrated at home as well.  Please follow-up with your primary care provider and urologist as needed.  Please return for any severe worsening symptoms such as recurrent fevers, severe dehydration, or inability to keep food or fluid down.

## 2023-11-13 ENCOUNTER — Ambulatory Visit: Payer: Medicare Other | Admitting: Physical Therapy

## 2023-11-18 ENCOUNTER — Ambulatory Visit: Payer: Medicare Other | Admitting: Physical Therapy

## 2023-11-20 ENCOUNTER — Ambulatory Visit: Payer: Medicare Other | Admitting: Physical Therapy

## 2023-11-25 ENCOUNTER — Other Ambulatory Visit: Payer: Self-pay

## 2023-11-25 ENCOUNTER — Ambulatory Visit (INDEPENDENT_AMBULATORY_CARE_PROVIDER_SITE_OTHER): Admitting: Gastroenterology

## 2023-11-25 ENCOUNTER — Encounter: Payer: Self-pay | Admitting: Gastroenterology

## 2023-11-25 ENCOUNTER — Ambulatory Visit: Payer: Medicare Other | Admitting: Physical Therapy

## 2023-11-25 VITALS — BP 128/75 | HR 82 | Temp 97.8°F

## 2023-11-25 DIAGNOSIS — R1312 Dysphagia, oropharyngeal phase: Secondary | ICD-10-CM

## 2023-11-25 DIAGNOSIS — R1310 Dysphagia, unspecified: Secondary | ICD-10-CM | POA: Diagnosis not present
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
## 2023-11-25 NOTE — Addendum Note (Signed)
 Addended by: Adela Ports on: 11/25/2023 03:34 PM   Modules accepted: Orders

## 2023-11-25 NOTE — Progress Notes (Signed)
 Wyline Mood MD, MRCP(U.K) [address]  [address]  [place], [place] [address]  Main: [phone]  Fax: [phone]   Primary Care Physician: [name], MD  Primary Gastroenterologist:  Dr. Wyline Mood   Chief Complaint  Patient presents with   throat problems    HPI: Cassandra [name] is a 89 y.o. female Summary of history :   Referred and seen in October 2023 for dysphagia.  She states that she has difficulty swallowing small pills to get lost when she tries to swallow but takes it with applesauce denies any coughing denies any actual pills that get lodged into her esophagus when she swallows.  She has to be on oxygen but is still waiting for her oxygen.Previously seen and evaluated by [facility] clinic gastroenterology in March 20 21-20 23 for the same.  Issues with dysphagia ongoing since 2019.  Recently seen at [facility] GI office in March 2023 March 2021 barium showed mild prominence of cricopharyngeus fold.  Plan at that time was for barium swallow.   11/14/2021 barium swallow showed study positive for aspiration nonspecific esophageal dysmotility noted small hiatal hernia patient refused barium tablet, was recommended modified barium swallow.   08/16/2022 modified barium swallow shows laryngeal penetration without frank aspiration, During the evaluation it was noted that when she consumes drinks via straw that led her to cough and she was advised to consume fluids without a straw and no further intervention is required at this point of time.    Interval history 12/26/2022-11/25/2023    She was seen by Dr. Karna Christmas on 11/13/2023 and notes suggest that her breathing is not good despite using oxygen at home.  Plan is to repeat a pulmonary function test in 6 months I have reviewed the results with the patient and she was not aware that she should drink fluids without a straw she states that she invariably has every drink using a straw.   She is back today to the office for  the same reasons that she was seen in the past.  States her throat is dry and she has some issues with regurgitation.  When she came into my office she came in with a large sippy cup with a large straw and she said every time she takes a drink she can regurgitate.  While in my office I made her drink from the same sippy cup without using a straw and the fluid went down without any issues there was no coughing whatsoever   Current Outpatient Medications  Medication Sig Dispense Refill   acetaminophen (TYLENOL) 325 MG tablet Take 1-2 tablets (325-650 mg total) by mouth every 6 (six) hours as needed for mild pain (pain score 1-3 or temp > 100.5).     amitriptyline (ELAVIL) 10 MG tablet Take 1 tablet by mouth at bedtime.     apixaban (ELIQUIS) 2.5 MG TABS tablet Take 1 tablet (2.5 mg total) by mouth 2 (two) times daily. 60 tablet 0   clopidogrel (PLAVIX) 75 MG tablet Take 1 tablet (75 mg total) by mouth daily. 30 tablet 0   desvenlafaxine (PRISTIQ) 50 MG 24 hr tablet Take 100 mg by mouth every evening.     docusate sodium (COLACE) 100 MG capsule Take 1 capsule (100 mg total) by mouth 2 (two) times daily. 10 capsule 0   gabapentin (NEURONTIN) 100 MG capsule Take 1 capsule (100 mg total) by mouth 2 (two) times daily as needed (severe phantom limb pain). (Patient taking differently: Take 100  mg by mouth daily as needed (severe phantom limb pain).) 30 capsule 0   gabapentin (NEURONTIN) 100 MG capsule Take 1 capsule by mouth 3 (three) times daily.     hydrOXYzine (ATARAX) 10 MG tablet Take 1 tablet (10 mg total) by mouth 3 (three) times daily as needed. 30 tablet 1   levothyroxine (SYNTHROID) 112 MCG tablet Take by mouth.     Melatonin 10 MG CAPS Take 10 mg by mouth at bedtime as needed (sleep).     metFORMIN (GLUCOPHAGE) 500 MG tablet Take 1 tablet (500 mg total) by mouth 2 (two) times daily with a meal. 60 tablet 0   methocarbamol (ROBAXIN) 500 MG tablet Take 1 tablet (500 mg total) by mouth every 6 (six)  hours as needed for muscle spasms. 30 tablet 0   Multiple Vitamin (MULTIVITAMIN WITH MINERALS) TABS tablet Take 1 tablet by mouth daily. 30 tablet 0   nitroGLYCERIN (NITROSTAT) 0.4 MG SL tablet Place 1 tablet (0.4 mg total) under the tongue every 5 (five) minutes as needed for chest pain. 90 tablet 0   pantoprazole (PROTONIX) 40 MG tablet Take 40 mg by mouth daily.     pregabalin (LYRICA) 25 MG capsule Take 1 capsule (25 mg total) by mouth at bedtime. 30 capsule 0   rosuvastatin (CRESTOR) 10 MG tablet Take 10 mg by mouth at bedtime.     telmisartan (MICARDIS) 40 MG tablet Take 40 mg by mouth daily.     torsemide (DEMADEX) 20 MG tablet Take 1 tablet (20 mg total) by mouth daily. 30 tablet 0   traMADol (ULTRAM) 50 MG tablet Take 1 tablet (50 mg total) by mouth every 6 (six) hours as needed for moderate pain or severe pain. 30 tablet 0   Vibegron (GEMTESA) 75 MG TABS Take 1 tablet (75 mg total) by mouth daily.     VITAMIN D PO Take 1 capsule by mouth daily.     Vitamin D, Ergocalciferol, (DRISDOL) 1.25 MG (50000 UNIT) CAPS capsule Take 1 capsule (50,000 Units total) by mouth every 7 (seven) days. Once a week on Thursdays 5 capsule 0   metoprolol tartrate (LOPRESSOR) 25 MG tablet Take 1 tablet (25 mg total) by mouth 2 (two) times daily. 60 tablet 2   No current facility-administered medications for this visit.    Allergies as of 11/25/2023 - Review Complete 11/25/2023  Allergen Reaction Noted   Baclofen Other (See Comments) 06/07/2022   Levofloxacin Other (See Comments) 07/10/2021     ROS:  General: Negative for anorexia, weight loss, fever, chills, fatigue, weakness. ENT: Negative for hoarseness, difficulty swallowing , nasal congestion. CV: Negative for chest pain, angina, palpitations, dyspnea on exertion, peripheral edema.  Respiratory: Negative for dyspnea at rest, dyspnea on exertion, cough, sputum, wheezing.  GI: See history of present illness. GU:  Negative for dysuria, hematuria,  urinary incontinence, urinary frequency, nocturnal urination.  Endo: Negative for unusual weight change.    Physical Examination:   BP 128/75   Pulse 82   Temp 97.8 F (36.6 C) (Oral)   LMP  (LMP Unknown) Comment: age 21  General: Well-nourished, well-developed in no acute distress.  Eyes: No icterus. Conjunctivae pink. Mouth: Oropharyngeal mucosa moist and pink , no lesions erythema or exudate. Lungs: Clear to auscultation bilaterally. Non-labored. Heart: Regular rate and rhythm, no murmurs rubs or gallops.  Abdomen: Bowel sounds are normal, nontender, nondistended, no hepatosplenomegaly or masses, no abdominal bruits or hernia , no rebound or guarding.   Extremities: No lower  extremity edema. No clubbing or deformities. Neuro: Alert and oriented x 3.  Grossly intact. Skin: Warm and dry, no jaundice.   Psych: Alert and cooperative, normal mood and affect.   Imaging Studies: CT ABDOMEN PELVIS W CONTRAST Result Date: 11/11/2023 CLINICAL DATA:  Left lower quadrant pain EXAM: CT ABDOMEN AND PELVIS WITH CONTRAST TECHNIQUE: Multidetector CT imaging of the abdomen and pelvis was performed using the standard protocol following bolus administration of intravenous contrast. RADIATION DOSE REDUCTION: This exam was performed according to the departmental dose-optimization program which includes automated exposure control, adjustment of the mA and/or kV according to patient size and/or use of iterative reconstruction technique. CONTRAST:  75mL OMNIPAQUE IOHEXOL 300 MG/ML  SOLN COMPARISON:  02/24/2021 FINDINGS: Lower chest: No acute abnormality. Aortic and coronary artery atherosclerosis. Hepatobiliary: No focal liver lesion identified. Prior cholecystectomy. Mild increase in the degree of intrahepatic and extrahepatic biliary dilatation. No obstructive etiology is evident by CT. Pancreas: Pancreas is atrophic. No ductal dilatation or inflammatory changes. Spleen: Normal in size without focal  abnormality. Adrenals/Urinary Tract: Unremarkable adrenal glands. 3 mm nonobstructing stone in the left kidney. Additional calcifications in the bilateral renal hila are favored to be vascular. No ureteral calculi. No hydronephrosis. Kidneys enhance symmetrically. Urinary bladder within normal limits. Stomach/Bowel: Stomach is within normal limits. Colonic diverticulosis. No evidence of bowel wall thickening, distention, or inflammatory changes. Moderate volume stool within the colon. Vascular/Lymphatic: Severe aortoiliac atherosclerosis. No enlarged abdominal or pelvic lymph nodes. Reproductive: Anteverted uterus. Fluid and a small amount of air within the endometrial canal. No adnexal masses. Other: No free air or free fluid. Musculoskeletal: No acute or significant osseous findings. Chronic compression fractures of T11 and L1. IMPRESSION: 1. No acute abdominopelvic findings. 2. Nonobstructing left renal calculus. 3. Colonic diverticulosis without evidence of acute diverticulitis. 4. Mild increase in the degree of intrahepatic and extrahepatic biliary dilatation. No obstructive etiology is evident by CT. Correlate with LFTs. 5. Fluid and a small amount of air within the endometrial canal. Correlate with patient's symptoms and consider follow-up pelvic ultrasound. 6. Moderate volume stool within the colon. 7. Aortic atherosclerosis. Electronically Signed   By: Duanne Guess D.O.   On: 11/11/2023 10:41    Assessment and Plan:   Cassandra [name] is a 89 y.o. y/o female here to follow-up for dysphagia.  Barium swallow showed features of aspiration which stimulated cough response.  A modified barium swallow showed that she was aspirating when she drank fluids utilizing a straw which she uses most of the time.  We reviewed the results with her and I suggested that she stop using a straw for sipping on her fluids  I have tried to avoid an upper endoscopy with anesthesia in view of the risks associated due to  her comorbidities and her age but if we have no other option then we will need to proceed with that.    Today she returns to the office with the same complaints that she has been seen in the past for.  She actually came to the office with a sippy cup with a straw.  I made her drink from the same sippy cup without using the straw and there was no issues with the liquid going down there was no regurgitation no coughing no vomiting.  I strongly suggested to her to stop using the straw with a sippy cup.  Out of an abundance of caution I will also get a repeat barium swallow with tablet to see if there is any  luminal obstruction.      Dr Wyline Mood  MD,MRCP [facility]) Follow up as needed if symptoms recur or any abnormality on the barium swallow

## 2023-11-25 NOTE — Patient Instructions (Signed)
 Barium swallow test with tablet: Please have a regular breakfast and a light lunch.  Please STOP using a straw.

## 2023-11-27 ENCOUNTER — Ambulatory Visit: Payer: Medicare Other | Admitting: Physical Therapy

## 2023-12-01 ENCOUNTER — Telehealth: Payer: Self-pay

## 2023-12-01 ENCOUNTER — Encounter: Payer: Self-pay | Admitting: Urology

## 2023-12-01 ENCOUNTER — Ambulatory Visit (INDEPENDENT_AMBULATORY_CARE_PROVIDER_SITE_OTHER): Payer: Medicare Other | Admitting: Urology

## 2023-12-01 VITALS — BP 136/79 | HR 71 | Ht 59.0 in | Wt 126.0 lb

## 2023-12-01 DIAGNOSIS — N390 Urinary tract infection, site not specified: Secondary | ICD-10-CM

## 2023-12-01 DIAGNOSIS — R32 Unspecified urinary incontinence: Secondary | ICD-10-CM

## 2023-12-01 LAB — BLADDER SCAN AMB NON-IMAGING

## 2023-12-01 NOTE — Progress Notes (Signed)
 I,Maysun L Gibbs,acting as a scribe for Cassandra Altes, MD.,have documented all relevant documentation on the behalf of Cassandra Altes, MD,as directed by  Cassandra Altes, MD while in the presence of Cassandra Altes, MD.  12/01/2023 7:30 PM   Cassandra [name] 03/16/1933 [id number]  Referring provider: [name], MD [address] [facility] [place],  [place] [address]  Chief Complaint  Patient presents with   Follow-up    HPI:  Cassandra [name] is a 89 y.o. female presents for follow-up visit.   Refer to Cassandra [name]'s note of 2017-2025. Urinalysis at that visit showed greater than 30 WBCs, however, she was asymptomatic at that time. She was given samples of Gemtesa for her urinary incontinence, which she states was not effective. She was seen in the ED 11/11/2023 complaining of lower abdominal pain and dysuria. Urinalysis showed >50 RBC/WBC. A CT abdomen pelvis was ordered, which showed no GU abnormality, she did have a non-obstructing left renal calculus. No hydronephrosis was noted. She was discharged on Ceftin. A urine culture was not ordered Her dysuria and lower abdominal discomfort has resolved.    PMH: Past Medical History:  Diagnosis Date   Acquired hypothyroidism 01/06/2014   Anxiety    Arthritis    B12 deficiency 02/15/2014   Benign essential hypertension 01/06/2014   Breast cancer (HCC) 1982   Right breast cancer - chemotherapy   CAD (coronary artery disease), native coronary artery 01/06/2014   Carotid artery calcification    Chronic airway obstruction, not elsewhere classified 01/06/2014   Chronic mesenteric ischemia (HCC)    s/p SMA stent 11/03/19   Depression    Diabetes mellitus without complication (HCC)    Dysrhythmia    aflutter with RVR 06/2016   History of kidney stones 2019   left ureteral stone   Incontinence of urine    Myocardial infarction (HCC) 1982   small infarct   Neuromuscular disorder (HCC)    restless  legs   Pacemaker    Presence of permanent cardiac pacemaker 07/2016   Pure hypercholesterolemia 01/06/2014   Renal insufficiency    Skin cancer    Sleep apnea    no longer uses a cpap. Uses oxygen as needed    Surgical History: Past Surgical History:  Procedure Laterality Date   AMPUTATION Left 01/03/2023   Procedure: AMPUTATION BELOW KNEE;  Surgeon: Roby Lofts, MD;  Location: [facility] OR;  Service: Orthopedics;  Laterality: Left;   ANKLE FUSION Left 10/10/2021   Procedure: ANKLE FUSION;  Surgeon: Roby Lofts, MD;  Location: [facility] OR;  Service: Orthopedics;  Laterality: Left;   APPENDECTOMY     APPLICATION OF WOUND VAC Left 02/24/2021   Procedure: APPLICATION OF WOUND VAC;  Surgeon: Samson Frederic, MD;  Location: [facility] OR;  Service: Orthopedics;  Laterality: Left;   AUGMENTATION MAMMAPLASTY Bilateral 1982/redo in 2014   prior mastectomy   CARDIAC CATHETERIZATION N/A 04/23/2016   Procedure: Left Heart Cath and Coronary Angiography;  Surgeon: Lamar Blinks, MD;  Location: [facility] INVASIVE CV LAB;  Service: Cardiovascular;  Laterality: N/A;   CARDIAC CATHETERIZATION Left 04/23/2016   Procedure: Coronary Stent Intervention;  Surgeon: Alwyn Pea, MD;  Location: [facility] INVAS[facility] CV LAB;  Service: Cardiovascular;  Laterality: Left;   CAROTID ARTERY ANGIOPLASTY Left 2010   had stent inserted and removed d/t infection. artery from leg inserted in left carotid   CAROTID ENDARTERECTOMY Left 2010   left CEA ~ 2010, s/p excision  infected left carotid patch & pseudoaneurysm with left CCA-ICA bypass using SVG 05/20/12   CHOLECYSTECTOMY     CORONARY ANGIOPLASTY WITH STENT PLACEMENT  september 9th 2017   CYSTOSCOPY W/ URETERAL STENT PLACEMENT Left 08/17/2017   Procedure: CYSTOSCOPY WITH RETROGRADE PYELOGRAM/URETERAL STENT PLACEMENT;  Surgeon: Jerilee Field, MD;  Location: [facility] ORS;  Service: Urology;  Laterality: Left;   CYSTOSCOPY W/ URETERAL STENT PLACEMENT Left 09/16/2017   Procedure:  CYSTOSCOPY WITH STENT REPLACEMENT;  Surgeon: Cassandra Altes, MD;  Location: [facility] ORS;  Service: Urology;  Laterality: Left;   CYSTOSCOPY/RETROGRADE/URETEROSCOPY/STONE EXTRACTION WITH BASKET Left 09/16/2017   Procedure: CYSTOSCOPY/RETROGRADE/URETEROSCOPY/STONE EXTRACTION WITH BASKET;  Surgeon: Cassandra Altes, MD;  Location: [facility] OR[facility];  Service: Urology;  Laterality: Left;   EXTERNAL FIXATION LEG Left 02/24/2021   Procedure: EXTERNAL FIXATION  ANKLE;  Surgeon: Samson Frederic, MD;  [facility]: [facility];  Service: Orthopedics;  Laterality: Left;   EXTERNAL FIXATION REMOVAL Left 02/26/2021   Procedure: REMOVAL EXTERNAL FIXATION LEG;  Surgeon: Roby Lofts, MD;  Location: [facility];  Service: Orthopedics;  Laterality: Left;   EYE SURGERY Bilateral    cataract extraction   I & D EXTREMITY Left 02/24/2021   Procedure: IRRIGATION AND DEBRIDEMENT EXTREMITY;  Surgeon: Samson Frederic, MD;  [facility]: [facility];  Service: Orthopedics;  Laterality: Left;   I & D EXTREMITY Left 12/21/2021   Procedure: IRRIGATION AND DEBRIDEMENT LEFT ANKLE;  Surgeon: Roby Lofts, MD;  Location: [facility];  Service: Orthopedics;  Laterality: Left;   I & D EXTREMITY Left 05/03/2022   Procedure: DEBRIDEMENT OF LEFT ANKLE INFECTION;  Surgeon: Roby Lofts, MD;  Location: [facility];  Service: Orthopedics;  Laterality: Left;   IR FLUORO GUIDE CV LINE LEFT  12/27/2021   IR FLUORO GUIDE CV LINE RIGHT  10/12/2021   IR REMOVAL TUN CV CATH W/O FL  11/22/2021   IR REMOVAL TUN CV CATH W/O FL  10/10/2022   IR US GUIDE VASC ACCESS LEFT  12/27/2021   IR US GUIDE VASC ACCESS RIGHT  10/12/2021   KYPHOPLASTY N/A 02/03/2018   Procedure: [id number];  Surgeon: Kennedy Bucker, MD;  Location: [facility];  Service: Orthopedics;  Laterality: N/A;   MASTECTOMY Right 1982   ORIF ANKLE FRACTURE Left 02/26/2021   Procedure: OPEN REDUCTION INTERNAL FIXATION (ORIF) ANKLE FRACTURE;  Surgeon: Roby Lofts, MD;  Location: [facility];  Service: Orthopedics;  Laterality: Left;    PACEMAKER INSERTION Left 07/03/2016   Procedure: INSERTION PACEMAKER;  Surgeon: Marcina Millard, MD;  Location: [facility];  Service: Cardiovascular;  Laterality: Left;   SKIN CANCER EXCISION     TONSILLECTOMY     TUBAL LIGATION     VISCERAL ANGIOGRAPHY N/A 11/03/2019   Procedure: VISCERAL ANGIOGRAPHY;  Surgeon: Renford Dills, MD;  Location: [facility];  Service: Cardiovascular;  Laterality: N/A;    Home Medications:  Allergies as of 12/01/2023       Reactions   Baclofen Other (See Comments)   Hallucinations   Levofloxacin Other (See Comments)   Hallucinations         Medication List        Accurate as of December 01, 2023  7:30 PM. If you have any questions, ask your nurse or doctor.          acetaminophen 325 MG tablet Commonly known as: TYLENOL Take 1-2 tablets (325-650 mg total) by mouth every 6 (six) hours as needed for mild pain (pain score 1-3 or temp > 100.5).  amitriptyline 10 MG tablet Commonly known as: ELAVIL Take 1 tablet by mouth at bedtime.   apixaban 2.5 MG Tabs tablet Commonly known as: ELIQUIS Take 1 tablet (2.5 mg total) by mouth 2 (two) times daily.   clopidogrel 75 MG tablet Commonly known as: PLAVIX Take 1 tablet (75 mg total) by mouth daily.   desvenlafaxine 50 MG 24 hr tablet Commonly known as: PRISTIQ Take 100 mg by mouth every evening.   docusate sodium 100 MG capsule Commonly known as: COLACE Take 1 capsule (100 mg total) by mouth 2 (two) times daily.   gabapentin 100 MG capsule Commonly known as: NEURONTIN Take 1 capsule (100 mg total) by mouth 2 (two) times daily as needed (severe phantom limb pain). What changed: when to take this   gabapentin 100 MG capsule Commonly known as: NEURONTIN Take 1 capsule by mouth 3 (three) times daily. What changed: Another medication with the same name was changed. Make sure you understand how and when to take each.   Gemtesa 75 MG Tabs Generic drug: Vibegron Take 1 tablet  (75 mg total) by mouth daily.   hydrOXYzine 10 MG tablet Commonly known as: ATARAX Take 1 tablet (10 mg total) by mouth 3 (three) times daily as needed.   levothyroxine 112 MCG tablet Commonly known as: SYNTHROID Take by mouth.   Melatonin 10 MG Caps Take 10 mg by mouth at bedtime as needed (sleep).   metFORMIN 500 MG tablet Commonly known as: GLUCOPHAGE Take 1 tablet (500 mg total) by mouth 2 (two) times daily with a meal.   methocarbamol 500 MG tablet Commonly known as: ROBAXIN Take 1 tablet (500 mg total) by mouth every 6 (six) hours as needed for muscle spasms.   metoprolol tartrate 25 MG tablet Commonly known as: LOPRESSOR Take 1 tablet (25 mg total) by mouth 2 (two) times daily.   multivitamin with minerals Tabs tablet Take 1 tablet by mouth daily.   nitroGLYCERIN 0.4 MG SL tablet Commonly known as: NITROSTAT Place 1 tablet (0.4 mg total) under the tongue every 5 (five) minutes as needed for chest pain.   pantoprazole 40 MG tablet Commonly known as: PROTONIX Take 40 mg by mouth daily.   pregabalin 25 MG capsule Commonly known as: LYRICA Take 1 capsule (25 mg total) by mouth at bedtime.   rosuvastatin 10 MG tablet Commonly known as: CRESTOR Take 10 mg by mouth at bedtime.   telmisartan 40 MG tablet Commonly known as: MICARDIS Take 40 mg by mouth daily.   torsemide 20 MG tablet Commonly known as: DEMADEX Take 1 tablet (20 mg total) by mouth daily.   traMADol 50 MG tablet Commonly known as: ULTRAM Take 1 tablet (50 mg total) by mouth every 6 (six) hours as needed for moderate pain or severe pain.   Vitamin D (Ergocalciferol) 1.25 MG (50000 UNIT) Caps capsule Commonly known as: DRISDOL Take 1 capsule (50,000 Units total) by mouth every 7 (seven) days. Once a week on Thursdays   VITAMIN D PO Take 1 capsule by mouth daily.        Allergies:  Allergies  Allergen Reactions   Baclofen Other (See Comments)    Hallucinations   Levofloxacin Other (See  Comments)    Hallucinations     Family History: Family History  Problem Relation Age of Onset   Prostate cancer Neg Hx    Kidney cancer Neg Hx    Breast cancer Neg Hx     Social History:  reports that she  quit smoking about 69 years ago. Her smoking use included cigarettes. She started smoking about 70 years ago. She has a 0.5 pack-year smoking history. She has been exposed to tobacco smoke. She has never used smokeless tobacco. She reports current alcohol use. She reports that she does not currently use drugs.   Physical Exam: BP 136/79   Pulse 71   Ht 4\' 11"  (1.499 m)   Wt 126 lb (57.2 kg)   LMP  (LMP Unknown) Comment: age 57  BMI 25.45 kg/m   Constitutional:  Alert and oriented, No acute distress. HEENT: [place] AT Cardiovascular: No clubbing, cyanosis, or edema. Respiratory: Normal respiratory effort, no increased work of breathing. Psychiatric: Normal mood and affect.   Assessment & Plan:    1. Recent UTI  Symptoms improved with antibiotic therapy, though urine culture was not ordered.   2. Urinary incontinence Has been refractory to beta-3 agonist. Would not recommend anticholinergics therapy in her age group with the exception of trospium. PVR today was 45 mL Provided literature on PTNS PA follow-up 3 months.  I have reviewed the above documentation for accuracy and completeness, and I agree with the above.   Cassandra Altes, MD  [facility] [address], [address] [place], [place] [address] [phone]

## 2023-12-01 NOTE — Telephone Encounter (Signed)
 Mr. Cassandra [name] called wanting Dr. Tobi [name] to prescribe his wife cough medicine since she is getting all choked up with her phlegm. I told him that Dr. Tobi [name] doesn't prescribe cough medicine and to please contact her PCP and ask for advise. Mr. Cassandra [name] agreed and stated that he would call him.

## 2023-12-02 ENCOUNTER — Ambulatory Visit: Payer: Medicare Other | Admitting: Physical Therapy

## 2023-12-03 ENCOUNTER — Encounter: Payer: Self-pay | Admitting: Urology

## 2023-12-04 ENCOUNTER — Ambulatory Visit: Payer: Medicare Other | Admitting: Physical Therapy

## 2023-12-08 ENCOUNTER — Inpatient Hospital Stay
Admission: EM | Admit: 2023-12-08 | Discharge: 2023-12-15 | DRG: 391 | Disposition: A | Discharge status: Skilled Nursing Facility | Attending: Obstetrics and Gynecology | Admitting: Obstetrics and Gynecology

## 2023-12-08 ENCOUNTER — Other Ambulatory Visit: Payer: Self-pay

## 2023-12-08 DIAGNOSIS — K5730 Diverticulosis of large intestine without perforation or abscess without bleeding: Secondary | ICD-10-CM | POA: Diagnosis present

## 2023-12-08 DIAGNOSIS — E875 Hyperkalemia: Secondary | ICD-10-CM | POA: Diagnosis not present

## 2023-12-08 DIAGNOSIS — N3000 Acute cystitis without hematuria: Secondary | ICD-10-CM | POA: Diagnosis present

## 2023-12-08 DIAGNOSIS — Z7902 Long term (current) use of antithrombotics/antiplatelets: Secondary | ICD-10-CM

## 2023-12-08 DIAGNOSIS — I480 Paroxysmal atrial fibrillation: Secondary | ICD-10-CM | POA: Diagnosis present

## 2023-12-08 DIAGNOSIS — F419 Anxiety disorder, unspecified: Secondary | ICD-10-CM | POA: Diagnosis present

## 2023-12-08 DIAGNOSIS — Z955 Presence of coronary angioplasty implant and graft: Secondary | ICD-10-CM

## 2023-12-08 DIAGNOSIS — M4856XA Collapsed vertebra, not elsewhere classified, lumbar region, initial encounter for fracture: Secondary | ICD-10-CM | POA: Diagnosis present

## 2023-12-08 DIAGNOSIS — J449 Chronic obstructive pulmonary disease, unspecified: Secondary | ICD-10-CM | POA: Diagnosis present

## 2023-12-08 DIAGNOSIS — I700 Atherosclerosis of aorta: Secondary | ICD-10-CM | POA: Diagnosis present

## 2023-12-08 DIAGNOSIS — E785 Hyperlipidemia, unspecified: Secondary | ICD-10-CM | POA: Diagnosis not present

## 2023-12-08 DIAGNOSIS — I2510 Atherosclerotic heart disease of native coronary artery without angina pectoris: Secondary | ICD-10-CM | POA: Diagnosis present

## 2023-12-08 DIAGNOSIS — Z87891 Personal history of nicotine dependence: Secondary | ICD-10-CM

## 2023-12-08 DIAGNOSIS — N1832 Chronic kidney disease, stage 3b: Secondary | ICD-10-CM | POA: Diagnosis present

## 2023-12-08 DIAGNOSIS — G4733 Obstructive sleep apnea (adult) (pediatric): Secondary | ICD-10-CM | POA: Diagnosis present

## 2023-12-08 DIAGNOSIS — N189 Chronic kidney disease, unspecified: Secondary | ICD-10-CM

## 2023-12-08 DIAGNOSIS — K224 Dyskinesia of esophagus: Secondary | ICD-10-CM | POA: Diagnosis not present

## 2023-12-08 DIAGNOSIS — Z89512 Acquired absence of left leg below knee: Secondary | ICD-10-CM

## 2023-12-08 DIAGNOSIS — R1310 Dysphagia, unspecified: Secondary | ICD-10-CM | POA: Diagnosis not present

## 2023-12-08 DIAGNOSIS — Z79890 Hormone replacement therapy (postmenopausal): Secondary | ICD-10-CM

## 2023-12-08 DIAGNOSIS — K838 Other specified diseases of biliary tract: Secondary | ICD-10-CM | POA: Diagnosis present

## 2023-12-08 DIAGNOSIS — K130 Diseases of lips: Secondary | ICD-10-CM | POA: Diagnosis present

## 2023-12-08 DIAGNOSIS — Z7984 Long term (current) use of oral hypoglycemic drugs: Secondary | ICD-10-CM

## 2023-12-08 DIAGNOSIS — N179 Acute kidney failure, unspecified: Secondary | ICD-10-CM | POA: Diagnosis present

## 2023-12-08 DIAGNOSIS — I130 Hypertensive heart and chronic kidney disease with heart failure and stage 1 through stage 4 chronic kidney disease, or unspecified chronic kidney disease: Secondary | ICD-10-CM | POA: Diagnosis present

## 2023-12-08 DIAGNOSIS — I708 Atherosclerosis of other arteries: Secondary | ICD-10-CM | POA: Diagnosis present

## 2023-12-08 DIAGNOSIS — Z7901 Long term (current) use of anticoagulants: Secondary | ICD-10-CM

## 2023-12-08 DIAGNOSIS — I252 Old myocardial infarction: Secondary | ICD-10-CM

## 2023-12-08 DIAGNOSIS — F32A Depression, unspecified: Secondary | ICD-10-CM | POA: Diagnosis present

## 2023-12-08 DIAGNOSIS — B961 Klebsiella pneumoniae [K. pneumoniae] as the cause of diseases classified elsewhere: Secondary | ICD-10-CM | POA: Diagnosis present

## 2023-12-08 DIAGNOSIS — Z8673 Personal history of transient ischemic attack (TIA), and cerebral infarction without residual deficits: Secondary | ICD-10-CM

## 2023-12-08 DIAGNOSIS — E039 Hypothyroidism, unspecified: Secondary | ICD-10-CM | POA: Diagnosis present

## 2023-12-08 DIAGNOSIS — K144 Atrophy of tongue papillae: Secondary | ICD-10-CM | POA: Diagnosis present

## 2023-12-08 DIAGNOSIS — E7800 Pure hypercholesterolemia, unspecified: Secondary | ICD-10-CM | POA: Diagnosis present

## 2023-12-08 DIAGNOSIS — Z1152 Encounter for screening for COVID-19: Secondary | ICD-10-CM

## 2023-12-08 DIAGNOSIS — R410 Disorientation, unspecified: Secondary | ICD-10-CM | POA: Insufficient documentation

## 2023-12-08 DIAGNOSIS — Z950 Presence of cardiac pacemaker: Secondary | ICD-10-CM

## 2023-12-08 DIAGNOSIS — G9341 Metabolic encephalopathy: Secondary | ICD-10-CM | POA: Diagnosis not present

## 2023-12-08 DIAGNOSIS — E1142 Type 2 diabetes mellitus with diabetic polyneuropathy: Secondary | ICD-10-CM | POA: Diagnosis present

## 2023-12-08 DIAGNOSIS — I5040 Unspecified combined systolic (congestive) and diastolic (congestive) heart failure: Secondary | ICD-10-CM | POA: Diagnosis present

## 2023-12-08 DIAGNOSIS — E1122 Type 2 diabetes mellitus with diabetic chronic kidney disease: Secondary | ICD-10-CM | POA: Diagnosis present

## 2023-12-08 DIAGNOSIS — E860 Dehydration: Secondary | ICD-10-CM | POA: Diagnosis present

## 2023-12-08 DIAGNOSIS — E1165 Type 2 diabetes mellitus with hyperglycemia: Secondary | ICD-10-CM | POA: Diagnosis present

## 2023-12-08 DIAGNOSIS — M4854XA Collapsed vertebra, not elsewhere classified, thoracic region, initial encounter for fracture: Secondary | ICD-10-CM | POA: Diagnosis present

## 2023-12-08 DIAGNOSIS — K2289 Other specified disease of esophagus: Secondary | ICD-10-CM | POA: Diagnosis present

## 2023-12-08 LAB — HEPATIC FUNCTION PANEL
ALT: 19 U/L (ref 0–44)
AST: 22 U/L (ref 15–41)
Albumin: 2.8 g/dL — ABNORMAL LOW (ref 3.5–5.0)
Alkaline Phosphatase: 94 U/L (ref 38–126)
Bilirubin, Direct: <0.1 mg/dL (ref 0.0–0.2)
Total Bilirubin: 0.7 mg/dL (ref 0.0–1.2)
Total Protein: 6.9 g/dL (ref 6.5–8.1)

## 2023-12-08 LAB — CBC
HCT: 35.1 % — ABNORMAL LOW (ref 36.0–46.0)
Hemoglobin: 10.9 g/dL — ABNORMAL LOW (ref 12.0–15.0)
MCH: 23.3 pg — ABNORMAL LOW (ref 26.0–34.0)
MCHC: 31.1 g/dL (ref 30.0–36.0)
MCV: 75 fL — ABNORMAL LOW (ref 80.0–100.0)
Platelets: 269 10*3/uL (ref 150–400)
RBC: 4.68 MIL/uL (ref 3.87–5.11)
RDW: 17.8 % — ABNORMAL HIGH (ref 11.5–15.5)
WBC: 10.7 10*3/uL — ABNORMAL HIGH (ref 4.0–10.5)
nRBC: 0 % (ref 0.0–0.2)

## 2023-12-08 LAB — BASIC METABOLIC PANEL WITH GFR
Anion gap: 9 (ref 5–15)
BUN: 36 mg/dL — ABNORMAL HIGH (ref 8–23)
CO2: 24 mmol/L (ref 22–32)
Calcium: 9.5 mg/dL (ref 8.9–10.3)
Chloride: 105 mmol/L (ref 98–111)
Creatinine, Ser: 1.49 mg/dL — ABNORMAL HIGH (ref 0.44–1.00)
GFR, Estimated: 33 mL/min — ABNORMAL LOW (ref >60)
Glucose, Bld: 315 mg/dL — ABNORMAL HIGH (ref 70–99)
Potassium: 5.9 mmol/L — ABNORMAL HIGH (ref 3.5–5.1)
Sodium: 138 mmol/L (ref 135–145)

## 2023-12-08 MED ORDER — VITAMIN D 25 MCG (1000 UNIT) PO TABS
1000.0000 [IU] | ORAL_TABLET | Freq: Every day | ORAL | Status: DC
  Administered 2023-12-09 – 2023-12-15 (×7): 1000 [IU] via ORAL
  Filled 2023-12-08 (×7): qty 1

## 2023-12-08 MED ORDER — ONDANSETRON HCL 4 MG PO TABS
4.0000 mg | ORAL_TABLET | Freq: Four times a day (QID) | ORAL | Status: DC | PRN
Start: 2023-12-08 — End: 2023-12-15

## 2023-12-08 MED ORDER — NITROGLYCERIN 0.4 MG SL SUBL: 0.4000 mg | SUBLINGUAL_TABLET | SUBLINGUAL | Status: DC | PRN

## 2023-12-08 MED ORDER — ROSUVASTATIN CALCIUM 10 MG PO TABS
10.0000 mg | ORAL_TABLET | Freq: Every day | ORAL | Status: DC
  Administered 2023-12-09 – 2023-12-14 (×6): 10 mg via ORAL
  Filled 2023-12-08 (×7): qty 1

## 2023-12-08 MED ORDER — SODIUM CHLORIDE 0.9 % IV BOLUS
1000.0000 mL | Freq: Once | INTRAVENOUS | Status: AC
  Administered 2023-12-08: 1000 mL via INTRAVENOUS

## 2023-12-08 MED ORDER — SODIUM CHLORIDE 0.9 % IV SOLN: INTRAVENOUS | Status: DC

## 2023-12-08 MED ORDER — ENOXAPARIN SODIUM 40 MG/0.4ML IJ SOSY
40.0000 mg | PREFILLED_SYRINGE | INTRAMUSCULAR | Status: DC
Start: 2023-12-08 — End: 2023-12-08

## 2023-12-08 MED ORDER — MAGNESIUM HYDROXIDE 400 MG/5ML PO SUSP: 30.0000 mL | Freq: Every day | ORAL | Status: DC | PRN

## 2023-12-08 MED ORDER — MELATONIN 5 MG PO TABS
10.0000 mg | ORAL_TABLET | Freq: Every evening | ORAL | Status: DC | PRN
  Administered 2023-12-09 – 2023-12-14 (×3): 10 mg via ORAL
  Filled 2023-12-08 (×4): qty 2

## 2023-12-08 MED ORDER — ACETAMINOPHEN 325 MG PO TABS
650.0000 mg | ORAL_TABLET | Freq: Four times a day (QID) | ORAL | Status: DC | PRN
  Administered 2023-12-09 – 2023-12-10 (×2): 650 mg via ORAL
  Filled 2023-12-08 (×2): qty 2

## 2023-12-08 MED ORDER — APIXABAN 2.5 MG PO TABS
2.5000 mg | ORAL_TABLET | Freq: Two times a day (BID) | ORAL | Status: DC
  Administered 2023-12-09 – 2023-12-15 (×13): 2.5 mg via ORAL
  Filled 2023-12-08 (×14): qty 1

## 2023-12-08 MED ORDER — METHOCARBAMOL 500 MG PO TABS
500.0000 mg | ORAL_TABLET | Freq: Four times a day (QID) | ORAL | Status: DC | PRN
  Filled 2023-12-08 (×2): qty 1

## 2023-12-08 MED ORDER — SODIUM ZIRCONIUM CYCLOSILICATE 10 G PO PACK
10.0000 g | PACK | Freq: Once | ORAL | Status: AC
  Administered 2023-12-09: 10 g via ORAL
  Filled 2023-12-08: qty 1

## 2023-12-08 MED ORDER — ACETAMINOPHEN 650 MG RE SUPP: 650.0000 mg | Freq: Four times a day (QID) | RECTAL | Status: DC | PRN

## 2023-12-08 MED ORDER — TRAMADOL HCL 50 MG PO TABS: 50.0000 mg | ORAL_TABLET | Freq: Four times a day (QID) | ORAL | Status: DC | PRN

## 2023-12-08 MED ORDER — HYDROXYZINE HCL 10 MG PO TABS
10.0000 mg | ORAL_TABLET | Freq: Three times a day (TID) | ORAL | Status: DC | PRN
  Administered 2023-12-13 – 2023-12-14 (×2): 10 mg via ORAL
  Filled 2023-12-08 (×3): qty 1

## 2023-12-08 MED ORDER — ADULT MULTIVITAMIN W/MINERALS CH
1.0000 | ORAL_TABLET | Freq: Every day | ORAL | Status: DC
  Administered 2023-12-09 – 2023-12-15 (×7): 1 via ORAL
  Filled 2023-12-08 (×7): qty 1

## 2023-12-08 MED ORDER — DOCUSATE SODIUM 100 MG PO CAPS
100.0000 mg | ORAL_CAPSULE | Freq: Two times a day (BID) | ORAL | Status: DC
  Administered 2023-12-09 – 2023-12-15 (×10): 100 mg via ORAL
  Filled 2023-12-08 (×13): qty 1

## 2023-12-08 MED ORDER — TRAZODONE HCL 50 MG PO TABS
25.0000 mg | ORAL_TABLET | Freq: Every evening | ORAL | Status: DC | PRN
  Administered 2023-12-12 – 2023-12-13 (×2): 25 mg via ORAL
  Filled 2023-12-08 (×2): qty 1

## 2023-12-08 MED ORDER — VENLAFAXINE HCL ER 75 MG PO CP24
75.0000 mg | ORAL_CAPSULE | Freq: Every day | ORAL | Status: DC
  Administered 2023-12-09 – 2023-12-15 (×7): 75 mg via ORAL
  Filled 2023-12-08 (×7): qty 1

## 2023-12-08 MED ORDER — CLOPIDOGREL BISULFATE 75 MG PO TABS: 75.0000 mg | ORAL_TABLET | Freq: Every day | ORAL | Status: DC

## 2023-12-08 MED ORDER — PANTOPRAZOLE SODIUM 40 MG PO TBEC
40.0000 mg | DELAYED_RELEASE_TABLET | Freq: Every day | ORAL | Status: DC
  Administered 2023-12-09 – 2023-12-15 (×8): 40 mg via ORAL
  Filled 2023-12-08 (×8): qty 1

## 2023-12-08 MED ORDER — INSULIN ASPART 100 UNIT/ML IV SOLN
5.0000 [IU] | Freq: Once | INTRAVENOUS | Status: AC
  Administered 2023-12-09: 5 [IU] via INTRAVENOUS
  Filled 2023-12-08: qty 0.05

## 2023-12-08 MED ORDER — GABAPENTIN 100 MG PO CAPS: 100.0000 mg | ORAL_CAPSULE | Freq: Every day | ORAL | Status: DC | PRN

## 2023-12-08 MED ORDER — GABAPENTIN 100 MG PO CAPS
100.0000 mg | ORAL_CAPSULE | Freq: Three times a day (TID) | ORAL | Status: DC
Start: 2023-12-08 — End: 2023-12-08

## 2023-12-08 MED ORDER — METOPROLOL TARTRATE 25 MG PO TABS
25.0000 mg | ORAL_TABLET | Freq: Two times a day (BID) | ORAL | Status: DC
  Administered 2023-12-09 – 2023-12-15 (×12): 25 mg via ORAL
  Filled 2023-12-08 (×13): qty 1

## 2023-12-08 MED ORDER — ONDANSETRON HCL 4 MG/2ML IJ SOLN: 4.0000 mg | Freq: Four times a day (QID) | INTRAMUSCULAR | Status: DC | PRN

## 2023-12-08 MED ORDER — LEVOTHYROXINE SODIUM 112 MCG PO TABS
112.0000 ug | ORAL_TABLET | Freq: Every day | ORAL | Status: DC
  Administered 2023-12-09 – 2023-12-10 (×2): 112 ug via ORAL
  Filled 2023-12-08 (×2): qty 1

## 2023-12-08 MED ORDER — MIRABEGRON ER 25 MG PO TB24: 25.0000 mg | ORAL_TABLET | Freq: Every day | ORAL | Status: DC

## 2023-12-08 NOTE — ED Notes (Signed)
 Bladder scanned. Volume 22 mL

## 2023-12-08 NOTE — Assessment & Plan Note (Addendum)
-   The patient be admitted to a medical telemetry observation bed. - GI consult will be obtained. - She will be kept NPO. - Dr. Mia Creek was notified about the patient and is aware. - The patient be hydrated with IV normal saline.

## 2023-12-08 NOTE — H&P (Signed)
    PATIENT NAME: Cassandra [name]    MR#:  [id number]  DATE OF BIRTH:  02/06/1933  DATE OF ADMISSION:  12/08/2023  PRIMARY CARE PHYSICIAN: [name], MD   Patient is coming from: Home  REQUESTING/REFERRING PHYSICIAN: Artis Delay, MD  CHIEF COMPLAINT:   Chief Complaint  Patient presents with   Urinary Retention    HISTORY OF PRESENT ILLNESS:  Cassandra [name] is a 89 y.o. Caucasian female with medical history significant for anxiety, depression, type 2 diabetes mellitus, s/p left BKA, coronary artery disease, COPD, hypothyroidism, OSA, who presented to the emergency room with acute onset of dysphagia that has been getting worse lately.  She has been following with Dr. Tobi Bastos for it.  She has not been urinating over the last 8 hours and since she has been in the ER.  No dysuria or hematuria or flank pain.  No fever or chills.  She has been having dry cough with choking without wheezing or dyspnea.  No chest pain or palpitations.  No bleeding diathesis.  ED Course: When she came to the ER, vital signs were within normal.  Labs revealed hyperkalemia 5.9 and a blood glucose of 315 with a BUN of 36 and creatinine 1.49 compared to 28/1.44 on 11/10/2023.  Albumin was 2.8 and LFTs were otherwise normal.  CBC showed WBCs of 10.7 and anemia with hemoglobin 10.9 hematocrit 35.1 with microcytosis. EKG as reviewed by me : EKG showed atrial paced rhythm with rate of 70 with left axis deviation and right bundle branch block. Imaging: Portable chest x-ray showed no acute cardiopulmonary disease.  The patient was given 1 L bolus of IV normal saline and was ordered Lokelma and D50 insulin.  She will be admitted to a medical telemetry observation bed for further evaluation and management.[name] PAST MEDICAL HISTORY:   Past Medical History:  Diagnosis Date   Acquired hypothyroidism 01/06/2014   Anxiety    Arthritis    B12 deficiency 02/15/2014   Benign essential hypertension  01/06/2014   Breast cancer (HCC) 1982   Right breast cancer - chemotherapy   CAD (coronary artery disease), native coronary artery 01/06/2014   Carotid artery calcification    Chronic airway obstruction, not elsewhere classified 01/06/2014   Chronic mesenteric ischemia (HCC)    s/p SMA stent 11/03/19   Depression    Diabetes mellitus without complication (HCC)    Dysrhythmia    aflutter with RVR 06/2016   History of kidney stones 2019   left ureteral stone   Incontinence of urine    Myocardial infarction (HCC) 1982   small infarct   Neuromuscular disorder (HCC)    restless legs   Pacemaker    Presence of permanent cardiac pacemaker 07/2016   Pure hypercholesterolemia 01/06/2014   Renal insufficiency    Skin cancer    Sleep apnea    no longer uses a cpap. Uses oxygen as needed    PAST SURGICAL HISTORY:   Past Surgical History:  Procedure Laterality Date   AMPUTATION Left 01/03/2023   Procedure: AMPUTATION BELOW KNEE;  Surgeon: Roby Lofts, MD;  Location: [facility];  Service: Orthopedics;  Laterality: Left;   ANKLE FUSION Left 10/10/2021   Procedure: ANKLE FUSION;  Surgeon: Roby Lofts, MD;  Location: [facility];  Service: Orthopedics;  Laterality: Left;   APPENDECTOMY     APPLICATION OF WOUND VAC Left 02/24/2021   Procedure: APPLICATION OF WOUND VAC;  Surgeon: Samson Frederic, MD;  Location: [facility];  Service: Orthopedics;  Laterality: Left;   AUGMENTATION MAMMAPLASTY Bilateral 1982/redo in 2014   prior mastectomy   CARDIAC CATHETERIZATION N/A 04/23/2016   Procedure: Left Heart Cath and Coronary Angiography;  Surgeon: Lamar Blinks, MD;  Location: [facility] INVASIVE CV [facility];  Service: Cardiovascular;  Laterality: N/A;   CARDIAC CATHETERIZATION Left 04/23/2016   Procedure: Coronary Stent Intervention;  Surgeon: Alwyn Pea, MD;  Location: [facility] CV [facility];  Service: Cardiovascular;  Laterality: Left;   CAROTID ARTERY ANGIOPLASTY Left 2010   had stent inserted and removed d/t  infection. artery from leg inserted in left carotid   CAROTID ENDARTERECTOMY Left 2010   left CEA ~ 2010, s/p excision infected left carotid patch & pseudoaneurysm with left CCA-ICA bypass using SVG 05/20/12   CHOLECYSTECTOMY     CORONARY ANGIOPLASTY WITH STENT PLACEMENT  september 9th 2017   CYSTOSCOPY W/ URETERAL STENT PLACEMENT Left 08/17/2017   Procedure: CYSTOSCOPY WITH RETROGRADE PYELOGRAM/URETERAL STENT PLACEMENT;  Surgeon: Jerilee Field, MD;  Location: [facility] ORS;  Service: Urology;  Laterality: Left;   CYSTOSCOPY W/ URETERAL STENT PLACEMENT Left 09/16/2017   Procedure: CYSTOSCOPY WITH STENT REPLACEMENT;  Surgeon: [name], MD;  Location: [facility];  Service: Urology;  Laterality: Left;   CYSTOSCOPY/RETROGRADE/URETEROSCOPY/STONE EXTRACTION WITH BASKET Left 09/16/2017   Procedure: CYSTOSCOPY/RETROGRADE/URETEROSCOPY/STONE EXTRACTION WITH BASKET;  Surgeon: [name], MD;  Location: [facility];  Service: Urology;  Laterality: Left;   EXTERNAL FIXATION LEG Left 02/24/2021   Procedure: EXTERNAL FIXATION  ANKLE;  Surgeon: Samson Frederic, MD;  Location: [facility];  Service: Orthopedics;  Laterality: Left;   EXTERNAL FIXATION REMOVAL Left 02/26/2021   Procedure: REMOVAL EXTERNAL FIXATION LEG;  Surgeon: Roby Lofts, MD;  Location: [facility];  Service: Orthopedics;  Laterality: Left;   EYE SURGERY Bilateral    cataract extraction   I & D EXTREMITY Left 02/24/2021   Procedure: IRRIGATION AND DEBRIDEMENT EXTREMITY;  Surgeon: Samson Frederic, MD;  Location: [facility];  Service: Orthopedics;  Laterality: Left;   I & D EXTREMITY Left 12/21/2021   Procedure: IRRIGATION AND DEBRIDEMENT LEFT ANKLE;  Surgeon: Roby Lofts, MD;  Location: [facility];  Service: Orthopedics;  Laterality: Left;   I & D EXTREMITY Left 05/03/2022   Procedure: DEBRIDEMENT OF LEFT ANKLE INFECTION;  Surgeon: Roby Lofts, MD;  Location: [facility];  Service: Orthopedics;  Laterality: Left;   IR FLUORO GUIDE CV LINE LEFT  12/27/2021    IR FLUORO GUIDE CV LINE RIGHT  10/12/2021   IR REMOVAL TUN CV CATH W/O FL  11/22/2021   IR REMOVAL TUN CV CATH W/O FL  10/10/2022   IR US GUIDE VASC ACCESS LEFT  12/27/2021   IR US GUIDE VASC ACCESS RIGHT  10/12/2021   KYPHOPLASTY N/A 02/03/2018   Procedure: [id number];  Surgeon: Kennedy Bucker, MD;  Location: [facility];  Service: Orthopedics;  Laterality: N/A;   MASTECTOMY Right 1982   ORIF ANKLE FRACTURE Left 02/26/2021   Procedure: OPEN REDUCTION INTERNAL FIXATION (ORIF) ANKLE FRACTURE;  Surgeon: Roby Lofts, MD;  Location: [facility];  Service: Orthopedics;  Laterality: Left;   PACEMAKER INSERTION Left 07/03/2016   Procedure: INSERTION PACEMAKER;  Surgeon: Marcina Millard, MD;  Location: [facility];  Service: Cardiovascular;  Laterality: Left;   SKIN CANCER EXCISION     TONSILLECTOMY     TUBAL LIGATION     VISCERAL ANGIOGRAPHY N/A 11/03/2019   Procedure: VISCERAL ANGIOGRAPHY;  Surgeon: Renford Dills, MD;  Location: [facility] INVASIVE  CV LAB;  Service: Cardiovascular;  Laterality: N/A;    SOCIAL HISTORY:   Social History   Tobacco Use   Smoking status: Former    Current packs/day: 0.00    Average packs/day: 0.5 packs/day for 1 year (0.5 ttl pk-yrs)    Types: Cigarettes    Start date: 74    Quit date: 44    Years since quitting: 69.3    Passive exposure: Past   Smokeless tobacco: Never  Substance Use Topics   Alcohol use: Yes    Comment: occasional wine    FAMILY HISTORY:   Family History  Problem Relation Age of Onset   Prostate cancer Neg Hx    Kidney cancer Neg Hx    Breast cancer Neg Hx     DRUG ALLERGIES:   Allergies  Allergen Reactions   Baclofen Other (See Comments)    Hallucinations   Levofloxacin Other (See Comments)    Hallucinations     REVIEW OF SYSTEMS:   ROS As per history of present illness. All pertinent systems were reviewed above. Constitutional, HEENT, cardiovascular, respiratory, GI, GU, musculoskeletal, neuro, psychiatric,  endocrine, integumentary and hematologic systems were reviewed and are otherwise negative/unremarkable except for positive findings mentioned above in the HPI.   MEDICATIONS AT HOME:   Prior to Admission medications   Medication Sig Start Date End Date Taking? Authorizing Provider  acetaminophen (TYLENOL) 325 MG tablet Take 1-2 tablets (325-650 mg total) by mouth every 6 (six) hours as needed for mild pain (pain score 1-3 or temp > 100.5). 01/27/23   Angiulli, Mcarthur Rossetti, PA-C  amitriptyline (ELAVIL) 10 MG tablet Take 1 tablet by mouth at bedtime. 03/10/23   [provider]  apixaban (ELIQUIS) 2.5 MG TABS tablet Take 1 tablet (2.5 mg total) by mouth 2 (two) times daily. 01/28/23   Angiulli, Mcarthur Rossetti, PA-C  clopidogrel (PLAVIX) 75 MG tablet Take 1 tablet (75 mg total) by mouth daily. 01/28/23   Angiulli, Mcarthur Rossetti, PA-C  desvenlafaxine (PRISTIQ) 50 MG 24 hr tablet Take 100 mg by mouth every evening.    [provider]  docusate sodium (COLACE) 100 MG capsule Take 1 capsule (100 mg total) by mouth 2 (two) times daily. 01/28/23   Angiulli, Mcarthur Rossetti, PA-C  gabapentin (NEURONTIN) 100 MG capsule Take 1 capsule (100 mg total) by mouth 2 (two) times daily as needed (severe phantom limb pain). Patient taking differently: Take 100 mg by mouth daily as needed (severe phantom limb pain). 01/30/23   Angiulli, Mcarthur Rossetti, PA-C  gabapentin (NEURONTIN) 100 MG capsule Take 1 capsule by mouth 3 (three) times daily. 02/26/23   [provider]  hydrOXYzine (ATARAX) 10 MG tablet Take 1 tablet (10 mg total) by mouth 3 (three) times daily as needed. 07/01/23   Kuppelweiser, Cassie L, RPH-CPP  levothyroxine (SYNTHROID) 112 MCG tablet Take by mouth. 02/13/23 02/13/24  [provider]  Melatonin 10 MG CAPS Take 10 mg by mouth at bedtime as needed (sleep).    [provider]  metFORMIN (GLUCOPHAGE) 500 MG tablet Take 1 tablet (500 mg total) by mouth 2 (two) times daily with a meal. 01/28/23    Angiulli, Mcarthur Rossetti, PA-C  methocarbamol (ROBAXIN) 500 MG tablet Take 1 tablet (500 mg total) by mouth every 6 (six) hours as needed for muscle spasms. 01/28/23   Angiulli, Mcarthur Rossetti, PA-C  metoprolol tartrate (LOPRESSOR) 25 MG tablet Take 1 tablet (25 mg total) by mouth 2 (two) times daily. 08/10/22 12/30/22  Jacquelin Hawking  A, MD  Multiple Vitamin (MULTIVITAMIN WITH MINERALS) TABS tablet Take 1 tablet by mouth daily. 09/24/17   Delfino Lovett, MD  nitroGLYCERIN (NITROSTAT) 0.4 MG SL tablet Place 1 tablet (0.4 mg total) under the tongue every 5 (five) minutes as needed for chest pain. 04/24/16   Adrian Saran, MD  pantoprazole (PROTONIX) 40 MG tablet Take 40 mg by mouth daily.    [provider]  pregabalin (LYRICA) 25 MG capsule Take 1 capsule (25 mg total) by mouth at bedtime. 01/28/23   Angiulli, Mcarthur Rossetti, PA-C  rosuvastatin (CRESTOR) 10 MG tablet Take 10 mg by mouth at bedtime. 09/04/22   [provider]  telmisartan (MICARDIS) 40 MG tablet Take 40 mg by mouth daily.    [provider]  torsemide (DEMADEX) 20 MG tablet Take 1 tablet (20 mg total) by mouth daily. 01/30/23   Angiulli, Mcarthur Rossetti, PA-C  traMADol (ULTRAM) 50 MG tablet Take 1 tablet (50 mg total) by mouth every 6 (six) hours as needed for moderate pain or severe pain. 01/28/23   Angiulli, Mcarthur Rossetti, PA-C  Vibegron (GEMTESA) 75 MG TABS Take 1 tablet (75 mg total) by mouth daily. 10/20/23   Vaillancourt, Lelon Mast, PA-C  VITAMIN D PO Take 1 capsule by mouth daily.    [provider]  Vitamin D, Ergocalciferol, (DRISDOL) 1.25 MG (50000 UNIT) CAPS capsule Take 1 capsule (50,000 Units total) by mouth every 7 (seven) days. Once a week on Thursdays 02/06/23   Jacquelynn Cree, PA-C      VITAL SIGNS:  Blood pressure 106/77, pulse 87, temperature 97.8 F (36.6 C), temperature source Oral, resp. rate 20, SpO2 93%.  PHYSICAL EXAMINATION:  Physical Exam  GENERAL:  89 y.o.-year-old patient lying in the bed with no acute distress.   EYES: Pupils equal, round, reactive to light and accommodation. No scleral icterus. Extraocular muscles intact.  HEENT: Head atraumatic, normocephalic. Oropharynx and nasopharynx clear.  NECK:  Supple, no jugular venous distention. No thyroid enlargement, no tenderness.  LUNGS: Normal breath sounds bilaterally, no wheezing, rales,rhonchi or crepitation. No use of accessory muscles of respiration.  CARDIOVASCULAR: Regular rate and rhythm, S1, S2 normal. No murmurs, rubs, or gallops.  ABDOMEN: Soft, nondistended, nontender. Bowel sounds present. No organomegaly or mass.  EXTREMITIES: No lower extremity edema, cyanosis, or clubbing.  She is status post left BKA with intact stump. NEUROLOGIC: Cranial nerves II through XII are intact. Muscle strength 5/5 in all extremities. Sensation intact. Gait not checked.  PSYCHIATRIC: The patient is alert and oriented x 3.  Normal affect and good eye contact. SKIN: No obvious rash, lesion, or ulcer.   LABORATORY PANEL:   CBC Recent Labs  Lab 12/08/23 1616  WBC 10.7*  HGB 10.9*  HCT 35.1*  PLT 269   ------------------------------------------------------------------------------------------------------------------  Chemistries  Recent Labs  Lab 12/08/23 1616  NA 138  K 5.9*  CL 105  CO2 24  GLUCOSE 315*  BUN 36*  CREATININE 1.49*  CALCIUM 9.5  AST 22  ALT 19  ALKPHOS 94  BILITOT 0.7   ------------------------------------------------------------------------------------------------------------------  Cardiac Enzymes No results for input(s): "TROPONINI" in the last 168 hours. ------------------------------------------------------------------------------------------------------------------  RADIOLOGY:  No results found.    IMPRESSION AND PLAN:  Assessment and Plan: * Dysphagia - The patient be admitted to a medical telemetry observation bed. - GI consult will be obtained. - She will be kept NPO. - Dr. Mia Creek was notified about  the patient and is aware. - The patient be hydrated with IV normal  saline.  Hyperkalemia - This was managed and potassium will be followed.  Acute kidney injury superimposed on chronic kidney disease (HCC) - This is likely prerenal due to volume repletion and dehydration. - The patient be hydrated with IV normal saline and will follow BMP. - We will avoid nephrotoxins.  Dyslipidemia - We will continue statin therapy.  Hypothyroidism Will continue Synthroid.  Type 2 diabetes mellitus with peripheral neuropathy (HCC) - The patient will be placed on supplemental coverage with NovoLog. - Will hold off metformin. - We will continue Neurontin.  Paroxysmal atrial fibrillation (HCC) - We will continue Eliquis.       DVT prophylaxis: Eliquis. Advanced Care Planning:  Code Status: full code. Family Communication:  The plan of care was discussed in details with the patient (and family). I answered all questions. The patient agreed to proceed with the above mentioned plan. Further management will depend upon hospital course. Disposition Plan: Back to previous home environment Consults called: GI All the records are reviewed and case discussed with ED provider.  Status is: Observation  I certify that at the time of admission, it is my clinical judgment that the patient will require  hospital care extending less than 2 midnights.                            Dispo: The patient is from: Home              Anticipated d/c is to: Home              Patient currently is not medically stable to d/c.              Difficult to place patient: No  Hannah Beat M.D on 12/08/2023 at 9:05 PM  [facility]   From 7 PM-7 AM, contact night-coverage [email or web address]  CC: Primary care physician; [name], MD

## 2023-12-08 NOTE — ED Notes (Signed)
 Pure wick placed on this pt.

## 2023-12-08 NOTE — Assessment & Plan Note (Signed)
 -  We will continue statin therapy.

## 2023-12-08 NOTE — Assessment & Plan Note (Addendum)
-   This was managed and potassium will be followed.

## 2023-12-08 NOTE — Assessment & Plan Note (Signed)
-   Will continue Synthroid.

## 2023-12-08 NOTE — Assessment & Plan Note (Addendum)
-   This is likely prerenal due to volume repletion and dehydration. - The patient be hydrated with IV normal saline and will follow BMP. - We will avoid nephrotoxins.

## 2023-12-08 NOTE — ED Provider Notes (Signed)
 [facility] Provider Note    Event Date/Time   First MD Initiated Contact with Patient 12/08/23 1818     (approximate)   History   Urinary Retention   HPI  Cassandra [name] is a 89 y.o. female with history of esophageal issues who comes in with continued issues with inability to eat and swallow.  She reports that she has been having chronic issues with this but when she followed up with Dr. Tobi Bastos I had gotten more worse to the point where now recently it has been significantly more worse and she is having difficulties swallowing.  She therefore has not urinated for over 8 hours and has not urinated since being here.  She been compliant with all of her medications   Physical Exam   Triage Vital Signs: ED Triage Vitals  Encounter Vitals Group     BP 12/08/23 1611 106/77     Systolic BP Percentile --      Diastolic BP Percentile --      Pulse Rate 12/08/23 1611 87     Resp 12/08/23 1611 20     Temp 12/08/23 1611 97.8 F (36.6 C)     Temp Source 12/08/23 1611 Oral     SpO2 12/08/23 1611 93 %     Weight --      Height --      Head Circumference --      Peak Flow --      Pain Score 12/08/23 1613 0     Pain Loc --      Pain Education --      Exclude from Growth Chart --     Most recent vital signs: Vitals:   12/08/23 1611  BP: 106/77  Pulse: 87  Resp: 20  Temp: 97.8 F (36.6 C)  SpO2: 93%     General: Awake, no distress.  CV:  Good peripheral perfusion.  Resp:  Normal effort.  Abd:  No distention.  Soft and nontender Other:     ED Results / Procedures / Treatments   Labs (all labs ordered are listed, but only abnormal results are displayed) Labs Reviewed  BASIC METABOLIC PANEL WITH GFR - Abnormal; Notable for the following components:      Result Value   Potassium 5.9 (*)    Glucose, Bld 315 (*)    BUN 36 (*)    Creatinine, Ser 1.49 (*)    GFR, Estimated 33 (*)    All other components within normal limits  CBC - Abnormal;  Notable for the following components:   WBC 10.7 (*)    Hemoglobin 10.9 (*)    HCT 35.1 (*)    MCV 75.0 (*)    MCH 23.3 (*)    RDW 17.8 (*)    All other components within normal limits  URINALYSIS, ROUTINE W REFLEX MICROSCOPIC  HEPATIC FUNCTION PANEL  CBG MONITORING, ED     EKG  My interpretation of EKG:  Atrially paced rhythm with a rate of 70 without any ST elevation, T wave version in V2, right bundle branch block   PROCEDURES:  Critical Care performed: Yes, see critical care procedure note(s)  .Critical Care  Performed by: Concha Se, MD Authorized by: Concha Se, MD   Critical care provider statement:    Critical care time (minutes):  30   Critical care was necessary to treat or prevent imminent or life-threatening deterioration of the following conditions:  Renal failure   Critical care  was time spent personally by me on the following activities:  Development of treatment plan with patient or surrogate, discussions with consultants, evaluation of patient's response to treatment, examination of patient, ordering and review of laboratory studies, ordering and review of radiographic studies, ordering and performing treatments and interventions, pulse oximetry, re-evaluation of patient's condition and review of old charts .1-3 Lead EKG Interpretation  Performed by: Concha Se, MD Authorized by: Concha Se, MD     Interpretation: normal     ECG rate assessment: normal     Rhythm: paced     Ectopy: none     Conduction: normal      MEDICATIONS ORDERED IN ED: Medications  sodium zirconium cyclosilicate (LOKELMA) packet 10 g (has no administration in time range)  insulin aspart (novoLOG) injection 5 Units (has no administration in time range)  sodium chloride 0.9 % bolus 1,000 mL (1,000 mLs Intravenous New Bag/Given 12/08/23 1923)     IMPRESSION / MDM / ASSESSMENT AND PLAN / ED COURSE  I reviewed the triage vital signs and the nursing notes.   Patient's  presentation is most consistent with acute presentation with potential threat to life or bodily function.   Patient's bladder scan was 0.  I suspect patient is having difficulties eating and drinking secondary to dysphagia which is causing her not to urinate due to dehydration.  Her labs show increasing BUN, creatinine as well as elevated potassium.  Will give patient a liter of fluids and see if she can help urinate.  Her abdomen is soft and nontender low suspicion for acute abdominal process.  Her difficulties was swallowing seem to be more of an acute on chronic issue.  Hemoglobin is stable.  White count is slightly elevated.  Patient's kidney functions and continuing to increase up to 1.49 with a potassium of 5.9.  I suspect patient does have dehydration will get bladder scan to ensure that she is not retaining but I suspect that this is related to dehydration from not eating or drinking.  I do think patient could benefit from admission for GI consultation  D/w Dr Mia Creek and okay with admission for dehydration   I will give some medications for her hyperkalemia and discussed the hospitalist for admission   The patient is on the cardiac monitor to evaluate for evidence of arrhythmia and/or significant heart rate changes.      FINAL CLINICAL IMPRESSION(S) / ED DIAGNOSES   Final diagnoses:  Hyperkalemia  AKI (acute kidney injury) (HCC)  Dysphagia, unspecified type     Rx / DC Orders   ED Discharge Orders     None        Note:  This document was prepared using Dragon voice recognition software and may include unintentional dictation errors.   Concha Se, MD 12/08/23 2033

## 2023-12-08 NOTE — ED Triage Notes (Signed)
 Per EMS family called 911 to have patient evaluated due to urinary retention for 8 hours.

## 2023-12-08 NOTE — Assessment & Plan Note (Signed)
 -  We will continue Eliquis.

## 2023-12-08 NOTE — Assessment & Plan Note (Signed)
-   The patient will be placed on supplemental coverage with NovoLog. - Will hold off metformin. - We will continue Neurontin.

## 2023-12-09 ENCOUNTER — Observation Stay

## 2023-12-09 ENCOUNTER — Ambulatory Visit: Payer: Medicare Other | Admitting: Physical Therapy

## 2023-12-09 DIAGNOSIS — K224 Dyskinesia of esophagus: Secondary | ICD-10-CM | POA: Diagnosis present

## 2023-12-09 DIAGNOSIS — E1142 Type 2 diabetes mellitus with diabetic polyneuropathy: Secondary | ICD-10-CM

## 2023-12-09 DIAGNOSIS — Z1152 Encounter for screening for COVID-19: Secondary | ICD-10-CM | POA: Diagnosis not present

## 2023-12-09 DIAGNOSIS — B961 Klebsiella pneumoniae [K. pneumoniae] as the cause of diseases classified elsewhere: Secondary | ICD-10-CM | POA: Diagnosis present

## 2023-12-09 DIAGNOSIS — R1310 Dysphagia, unspecified: Secondary | ICD-10-CM | POA: Diagnosis present

## 2023-12-09 DIAGNOSIS — I5040 Unspecified combined systolic (congestive) and diastolic (congestive) heart failure: Secondary | ICD-10-CM | POA: Diagnosis present

## 2023-12-09 DIAGNOSIS — M4856XA Collapsed vertebra, not elsewhere classified, lumbar region, initial encounter for fracture: Secondary | ICD-10-CM | POA: Diagnosis present

## 2023-12-09 DIAGNOSIS — N3000 Acute cystitis without hematuria: Secondary | ICD-10-CM | POA: Diagnosis present

## 2023-12-09 DIAGNOSIS — Z89512 Acquired absence of left leg below knee: Secondary | ICD-10-CM | POA: Diagnosis not present

## 2023-12-09 DIAGNOSIS — I700 Atherosclerosis of aorta: Secondary | ICD-10-CM | POA: Diagnosis present

## 2023-12-09 DIAGNOSIS — E875 Hyperkalemia: Secondary | ICD-10-CM | POA: Diagnosis present

## 2023-12-09 DIAGNOSIS — F32A Depression, unspecified: Secondary | ICD-10-CM | POA: Diagnosis present

## 2023-12-09 DIAGNOSIS — E785 Hyperlipidemia, unspecified: Secondary | ICD-10-CM | POA: Diagnosis not present

## 2023-12-09 DIAGNOSIS — K2289 Other specified disease of esophagus: Secondary | ICD-10-CM | POA: Diagnosis present

## 2023-12-09 DIAGNOSIS — J449 Chronic obstructive pulmonary disease, unspecified: Secondary | ICD-10-CM | POA: Diagnosis present

## 2023-12-09 DIAGNOSIS — I130 Hypertensive heart and chronic kidney disease with heart failure and stage 1 through stage 4 chronic kidney disease, or unspecified chronic kidney disease: Secondary | ICD-10-CM | POA: Diagnosis present

## 2023-12-09 DIAGNOSIS — R1314 Dysphagia, pharyngoesophageal phase: Secondary | ICD-10-CM | POA: Diagnosis not present

## 2023-12-09 DIAGNOSIS — E039 Hypothyroidism, unspecified: Secondary | ICD-10-CM | POA: Diagnosis present

## 2023-12-09 DIAGNOSIS — G9341 Metabolic encephalopathy: Secondary | ICD-10-CM | POA: Diagnosis not present

## 2023-12-09 DIAGNOSIS — E1165 Type 2 diabetes mellitus with hyperglycemia: Secondary | ICD-10-CM | POA: Diagnosis present

## 2023-12-09 DIAGNOSIS — I480 Paroxysmal atrial fibrillation: Secondary | ICD-10-CM

## 2023-12-09 DIAGNOSIS — E860 Dehydration: Secondary | ICD-10-CM | POA: Diagnosis present

## 2023-12-09 DIAGNOSIS — K838 Other specified diseases of biliary tract: Secondary | ICD-10-CM | POA: Diagnosis present

## 2023-12-09 DIAGNOSIS — N1832 Chronic kidney disease, stage 3b: Secondary | ICD-10-CM | POA: Diagnosis present

## 2023-12-09 DIAGNOSIS — M4854XA Collapsed vertebra, not elsewhere classified, thoracic region, initial encounter for fracture: Secondary | ICD-10-CM | POA: Diagnosis present

## 2023-12-09 DIAGNOSIS — E1122 Type 2 diabetes mellitus with diabetic chronic kidney disease: Secondary | ICD-10-CM | POA: Diagnosis present

## 2023-12-09 DIAGNOSIS — N179 Acute kidney failure, unspecified: Secondary | ICD-10-CM | POA: Diagnosis not present

## 2023-12-09 LAB — RETICULOCYTES
Immature Retic Fract: 22.9 % — ABNORMAL HIGH (ref 2.3–15.9)
RBC.: 4.73 MIL/uL (ref 3.87–5.11)
Retic Count, Absolute: 71.9 10*3/uL (ref 19.0–186.0)
Retic Ct Pct: 1.5 % (ref 0.4–3.1)

## 2023-12-09 LAB — CBC
HCT: 35.1 % — ABNORMAL LOW (ref 36.0–46.0)
Hemoglobin: 11.1 g/dL — ABNORMAL LOW (ref 12.0–15.0)
MCH: 23.5 pg — ABNORMAL LOW (ref 26.0–34.0)
MCHC: 31.6 g/dL (ref 30.0–36.0)
MCV: 74.2 fL — ABNORMAL LOW (ref 80.0–100.0)
Platelets: 248 10*3/uL (ref 150–400)
RBC: 4.73 MIL/uL (ref 3.87–5.11)
RDW: 17.6 % — ABNORMAL HIGH (ref 11.5–15.5)
WBC: 9.3 10*3/uL (ref 4.0–10.5)
nRBC: 0 % (ref 0.0–0.2)

## 2023-12-09 LAB — GLUCOSE, CAPILLARY
Glucose-Capillary: 283 mg/dL — ABNORMAL HIGH (ref 70–99)
Glucose-Capillary: 301 mg/dL — ABNORMAL HIGH (ref 70–99)

## 2023-12-09 LAB — BASIC METABOLIC PANEL WITH GFR
Anion gap: 9 (ref 5–15)
BUN: 30 mg/dL — ABNORMAL HIGH (ref 8–23)
CO2: 19 mmol/L — ABNORMAL LOW (ref 22–32)
Calcium: 8.9 mg/dL (ref 8.9–10.3)
Chloride: 108 mmol/L (ref 98–111)
Creatinine, Ser: 1.21 mg/dL — ABNORMAL HIGH (ref 0.44–1.00)
GFR, Estimated: 43 mL/min — ABNORMAL LOW (ref >60)
Glucose, Bld: 195 mg/dL — ABNORMAL HIGH (ref 70–99)
Potassium: 3.9 mmol/L (ref 3.5–5.1)
Sodium: 136 mmol/L (ref 135–145)

## 2023-12-09 LAB — URINALYSIS, ROUTINE W REFLEX MICROSCOPIC
Bilirubin Urine: NEGATIVE
Glucose, UA: 50 mg/dL — AB
Hgb urine dipstick: NEGATIVE
Ketones, ur: NEGATIVE mg/dL
Nitrite: POSITIVE — AB
Protein, ur: 30 mg/dL — AB
Specific Gravity, Urine: 1.018 (ref 1.005–1.030)
pH: 5 (ref 5.0–8.0)

## 2023-12-09 LAB — FOLATE: Folate: >40 ng/mL (ref >5.9)

## 2023-12-09 LAB — IRON AND TIBC
Iron: 41 ug/dL (ref 28–170)
Saturation Ratios: 13 % (ref 10.4–31.8)
TIBC: 308 ug/dL (ref 250–450)
UIBC: 267 ug/dL

## 2023-12-09 LAB — CBG MONITORING, ED: Glucose-Capillary: 206 mg/dL — ABNORMAL HIGH (ref 70–99)

## 2023-12-09 LAB — FERRITIN: Ferritin: 23 ng/mL (ref 11–307)

## 2023-12-09 LAB — VITAMIN B12: Vitamin B-12: 576 pg/mL (ref 180–914)

## 2023-12-09 MED ORDER — INSULIN ASPART 100 UNIT/ML IJ SOLN
0.0000 [IU] | Freq: Three times a day (TID) | INTRAMUSCULAR | Status: DC
  Administered 2023-12-09: 8 [IU] via SUBCUTANEOUS
  Administered 2023-12-10 – 2023-12-11 (×4): 3 [IU] via SUBCUTANEOUS
  Administered 2023-12-12: 11 [IU] via SUBCUTANEOUS
  Administered 2023-12-12: 5 [IU] via SUBCUTANEOUS
  Administered 2023-12-12: 3 [IU] via SUBCUTANEOUS
  Administered 2023-12-13: 8 [IU] via SUBCUTANEOUS
  Administered 2023-12-13: 2 [IU] via SUBCUTANEOUS
  Administered 2023-12-13 (×2): 8 [IU] via SUBCUTANEOUS
  Administered 2023-12-14: 5 [IU] via SUBCUTANEOUS
  Administered 2023-12-14: 11 [IU] via SUBCUTANEOUS
  Administered 2023-12-14: 5 [IU] via SUBCUTANEOUS
  Administered 2023-12-15: 3 [IU] via SUBCUTANEOUS
  Administered 2023-12-15: 15 [IU] via SUBCUTANEOUS
  Filled 2023-12-09 (×17): qty 1

## 2023-12-09 MED ORDER — MIRABEGRON ER 25 MG PO TB24
25.0000 mg | ORAL_TABLET | Freq: Every day | ORAL | Status: DC
  Administered 2023-12-09 – 2023-12-15 (×7): 25 mg via ORAL
  Filled 2023-12-09 (×7): qty 1

## 2023-12-09 MED ORDER — CLOPIDOGREL BISULFATE 75 MG PO TABS
75.0000 mg | ORAL_TABLET | Freq: Every day | ORAL | Status: DC
  Administered 2023-12-09 – 2023-12-15 (×7): 75 mg via ORAL
  Filled 2023-12-09 (×7): qty 1

## 2023-12-09 NOTE — ED Notes (Signed)
 Pt ABCs intact. RR even and unlabored. Pt in NAD. Bed in lowest locked position. Call bell in reach. Denies needs at this time.

## 2023-12-09 NOTE — ED Notes (Signed)
 Pt resting in stretcher at this time. Pt ABCs intact. RR even and unlabored. Pt in NAD. Bed in lowest locked position. Call bell in reach.

## 2023-12-09 NOTE — Progress Notes (Signed)
 PROGRESS NOTE    Cassandra [name]  [name] DOB: 06/27/1933 DOA: 12/08/2023 PCP: [name], MD  Chief Complaint  Patient presents with   Urinary Retention    Hospital Course:  Cassandra [name] is 89 y.o. female with anxiety, depression, type 2 diabetes, status post left BKA, CAD, COPD, hypothyroidism, OSA, chronic dysphagia with acute worsening.  Patient has been following outpatient with gastroenterology and has had prior barium swallows are unremarkable.  She had planned for repeat barium swallow but has not had this done yet. Patient also endorses low urinary output but dry cough and choking sensation. In the ER she was found to be hyperkalemic, hyperglycemic, creatinine 1.49, BUN 36.  Patient was given 1 L NS, Lokelma, D50 insulin.  Subjective: Reports that she is feeling somewhat better.  She endorses pain in the corners of her mouth and a dry tongue.  She is also endorsing right tongue and difficulty swallowing.   Objective: Vitals:   12/09/23 0200 12/09/23 0340 12/09/23 0509 12/09/23 0724  BP: 107/63 131/61  (!) 146/73  Pulse: 63 66 69 67  Resp: 16 15 15 18   Temp:   (!) 97.5 F (36.4 C) (!) 97.5 F (36.4 C)  TempSrc:   Oral Axillary  SpO2: 98% 98% 99% 96%    Intake/Output Summary (Last 24 hours) at 12/09/2023 0758 Last data filed at 12/08/2023 2024 Gross per 24 hour  Intake 974.72 ml  Output --  Net 974.72 ml   There were no vitals filed for this visit.  Examination: General exam: Appears calm and comfortable, NAD HEENT: Tongue is abnormally smooth and glossy.  Beginnings of angular cheilitis appreciated Respiratory system: No work of breathing, symmetric chest wall expansion Cardiovascular system: S1 & S2 heard, RRR.  Gastrointestinal system: Abdomen is nondistended, soft and nontender.  Neuro: Alert and oriented. No focal neurological deficits. Extremities: Symmetric, expected ROM Skin: No rashes, lesions Psychiatry: Demonstrates appropriate  judgement and insight. Mood & affect appropriate for situation.   Assessment & Plan:  Principal Problem:   Dysphagia Active Problems:   Hyperkalemia   Acute kidney injury superimposed on chronic kidney disease (HCC)   Paroxysmal atrial fibrillation (HCC)   Type 2 diabetes mellitus with peripheral neuropathy (HCC)   Hypothyroidism   Dyslipidemia    Dysphagia - This is a chronic problem since at least 2023 - Has been following with gastroenterology, Dr. Tobi Bastos outpatient - Discussed directly with gastroenterology, Dr. Servando Snare who is planning for barium swallow tomorrow.  Atrophic glossitis Angular cheilitis - Patient endorses these conditions are painful - This may be secondary to vitamin deficiencies, B12, folate, and iron panel pending -- Symptomatic support with water and Carmex ordered  Hyperkalemia - Resolved, status post Lokelma and insulin  CKD stage IIIb - Some acute worsening.  Does not meet criteria for AKI. - Likely prerenal due to dehydration - Appears to have resolved near baseline.  Will discontinue further IV fluids - Appears baseline creatinine between 1.1 and 1.2. - Continue IV fluids, follow BMP - Avoid nephrotoxic meds - Creatinine clearance 24  Intrahepatic and extrahepatic biliary dilation - Nonobstructive etiology seen on CT - LFTs unremarkable - Trend CMP  Microcytic anemia - Hemoglobin 10.9 on arrival, repeat down to 7.7 also with thrombocytopenia.  Have rechecked CBC and these issues have resolved.  Suspect lab error.  - Continue to trend CBC - Transfuse if under 7 -- Anemia panel ordered  Aortoiliac atherosclerosis - Incidentally seen on CT.  Severe aortoiliac atherosclerosis -  Outpatient follow-up  Paroxysmal atrial fibrillation - Continue on Eliquis.  Left BKA - stable. Aware.  OSA - Does not use CPAP at home  Combined systolic and diastolic heart failure - Patient clinically dry on arrival - IV fluids as above, monitor closely for  volume status  Chronic compression fractures T11 and L1 - Seen on CT - Continue PT/OT  Colonic diverticulosis - Incidentally seen on CT - Continue bowel regimen  Air in endometrial canal - Incidentally seen on CT.  Patient is asymptomatic from this standpoint. - Consider pelvic ultrasound if changes   DVT prophylaxis: Eliquis   Code Status: Full Code Family Communication:  Discussed directly with patient Disposition: Inpatient, hospitalized for workup of dysphagia.  Barium swallow tomorrow.  Consultants:  Treatment Team:  Consulting Physician: [name], MD  Procedures:    Antimicrobials:  Anti-infectives (From admission, onward)    None       Data Reviewed: I have personally reviewed following labs and imaging studies CBC: Recent Labs  Lab 12/08/23 1616 12/09/23 0547  WBC 10.7* 7.7  HGB 10.9* 7.7*  HCT 35.1* 25.1*  MCV 75.0* 76.5*  PLT 269 136*   Basic Metabolic Panel: Recent Labs  Lab 12/08/23 1616 12/09/23 0547  NA 138 136  K 5.9* 3.9  CL 105 108  CO2 24 19*  GLUCOSE 315* 195*  BUN 36* 30*  CREATININE 1.49* 1.21*  CALCIUM 9.5 8.9   GFR: Estimated Creatinine Clearance: 24.3 mL/min (A) (by C-G formula based on SCr of 1.21 mg/dL (H)). Liver Function Tests: Recent Labs  Lab 12/08/23 1616  AST 22  ALT 19  ALKPHOS 94  BILITOT 0.7  PROT 6.9  ALBUMIN 2.8*   CBG: Recent Labs  Lab 12/09/23 0551  GLUCAP 206*    No results found for this or any previous visit (from the past 240 hours).   Radiology Studies: No results found.  Scheduled Meds:  apixaban  2.5 mg Oral BID   cholecalciferol  1,000 Units Oral Daily   clopidogrel  75 mg Oral Daily   docusate sodium  100 mg Oral BID   levothyroxine  112 mcg Oral Q0600   metoprolol tartrate  25 mg Oral BID   mirabegron ER  25 mg Oral Daily   multivitamin with minerals  1 tablet Oral Daily   pantoprazole  40 mg Oral Daily   rosuvastatin  10 mg Oral QHS   venlafaxine XR  75 mg Oral Q  breakfast   Continuous Infusions:  sodium chloride 100 mL/hr at 12/09/23 0024     LOS: 0 days  MDM: Patient is high risk for one or more organ failure.  They necessitate ongoing hospitalization for continued IV therapies and subsequent lab monitoring. Total time spent interpreting labs and vitals, coordinating care amongst consultants and care team members, directly assessing and discussing care with the patient and/or family: 55 min    Debarah Crape, DO [facility]  To contact the attending physician between 7A-7P please use Epic Chat. To contact the covering physician during after hours 7P-7A, please review Amion.   12/09/2023, 7:58 AM   *This document has been created with the assistance of dictation software. Please excuse typographical errors. *

## 2023-12-09 NOTE — Inpatient Diabetes Management (Signed)
 Inpatient Diabetes Program Recommendations  AACE/ADA: New Consensus Statement on Inpatient Glycemic Control (2015)  Target Ranges:  Prepandial:   less than 140 mg/dL      Peak postprandial:   less than 180 mg/dL (1-2 hours)      Critically ill patients:  140 - 180 mg/dL   Lab Results  Component Value Date   GLUCAP 206 (H) 12/09/2023   HGBA1C 6.2 (H) 01/15/2023    Review of Glycemic Control  Latest Reference Range & Units 12/08/23 16:16 12/09/23 05:47  Glucose 70 - 99 mg/dL 409 (H) 811 (H)   Diabetes history: DM  Outpatient Diabetes medications:  Metformin 500 mg bid Current orders for Inpatient glycemic control:  None  Inpatient Diabetes Program Recommendations:    May consider adding Novolog 0-6 units tid with meals and HS while in the hospital (starts at 151 mg/dL).   Thanks,  Lorenza Cambridge, RN, BC-ADM Inpatient Diabetes Coordinator Pager [phone]  (8a-5p)

## 2023-12-09 NOTE — ED Notes (Signed)
 This RN assumed care of pt at this time. Pt reporting to ED initially to urinary retention for 8 hours, possible UTI, and several other minor complaints. Pt is currently being changed into adult briefs with help of daughter and this RN. Pt found to be a L sided BTK amputee. Pt ABCs intact. RR even and unlabored. Pt in NAD. Bed in lowest locked position. Call bell in reach.   Past Medical History:  Diagnosis Date   Acquired hypothyroidism 01/06/2014   Anxiety    Arthritis    B12 deficiency 02/15/2014   Benign essential hypertension 01/06/2014   Breast cancer (HCC) 1982   Right breast cancer - chemotherapy   CAD (coronary artery disease), native coronary artery 01/06/2014   Carotid artery calcification    Chronic airway obstruction, not elsewhere classified 01/06/2014   Chronic mesenteric ischemia (HCC)    s/p SMA stent 11/03/19   Depression    Diabetes mellitus without complication (HCC)    Dysrhythmia    aflutter with RVR 06/2016   History of kidney stones 2019   left ureteral stone   Incontinence of urine    Myocardial infarction (HCC) 1982   small infarct   Neuromuscular disorder (HCC)    restless legs   Pacemaker    Presence of permanent cardiac pacemaker 07/2016   Pure hypercholesterolemia 01/06/2014   Renal insufficiency    Skin cancer    Sleep apnea    no longer uses a cpap. Uses oxygen as needed

## 2023-12-09 NOTE — ED Notes (Signed)
 Pt placed on [place] @ 2 lpm when this RN noted the pts O2 saturation decreased to 89% with good pleth.

## 2023-12-09 NOTE — Consult Note (Signed)
 Cassandra Minium, MD [facility]  [address] [place], [place] [address] Phone: [phone] Fax : [phone]  Consultation  Referring Provider:     Dr. Rennis Chris Primary Care Physician:  [name], MD Primary Gastroenterologist:  Dr. Tobi Bastos         Reason for Consultation:     Dysphagia  Date of Admission:  12/08/2023 Date of Consultation:  12/09/2023         HPI:   Cassandra [name] is a 89 y.o. female who has a history of depression, diabetes status post BKA with coronary artery disease COPD hypothyroidism and came to the hospital because of problems with urination for the last 8 hours prior to admission.  During this admission the patient had reported to the hospitalist that she continues to have trouble with swallowing.  She has had the sensation that food gets stuck in her esophagus after she eats.  The patient was seen by Dr. Tobi Bastos last month for the same issue.  In 2023 the patient underwent an evaluation by speech pathology that recommended thin liquids and the patient not to drink with a straw.  The reason for restricting the use with a straw was because of coughing when she would drink liquids. The patient had reported to Dr. Tobi Bastos that her throat was dry and today she is reporting to me that she has a dry tongue.  Due to her poor general state she was recommended to have a repeat barium swallow with trying to avoid an upper endoscopy due to her comorbidities.  From the chart it appears that the patient has a swallowing test tomorrow scheduled by Dr. Tobi Bastos.  Past Medical History:  Diagnosis Date   Acquired hypothyroidism 01/06/2014   Anxiety    Arthritis    B12 deficiency 02/15/2014   Benign essential hypertension 01/06/2014   Breast cancer (HCC) 1982   Right breast cancer - chemotherapy   CAD (coronary artery disease), native coronary artery 01/06/2014   Carotid artery calcification    Chronic airway obstruction, not elsewhere classified 01/06/2014   Chronic  mesenteric ischemia (HCC)    s/p SMA stent 11/03/19   Depression    Diabetes mellitus without complication (HCC)    Dysrhythmia    aflutter with RVR 06/2016   History of kidney stones 2019   left ureteral stone   Incontinence of urine    Myocardial infarction (HCC) 1982   small infarct   Neuromuscular disorder (HCC)    restless legs   Pacemaker    Presence of permanent cardiac pacemaker 07/2016   Pure hypercholesterolemia 01/06/2014   Renal insufficiency    Skin cancer    Sleep apnea    no longer uses a cpap. Uses oxygen as needed    Past Surgical History:  Procedure Laterality Date   AMPUTATION Left 01/03/2023   Procedure: AMPUTATION BELOW KNEE;  Surgeon: Roby Lofts, MD;  Location: [facility];  Service: Orthopedics;  Laterality: Left;   ANKLE FUSION Left 10/10/2021   Procedure: ANKLE FUSION;  Surgeon: Roby Lofts, MD;  Location: [facility];  Service: Orthopedics;  Laterality: Left;   APPENDECTOMY     APPLICATION OF WOUND VAC Left 02/24/2021   Procedure: APPLICATION OF WOUND VAC;  Surgeon: Samson Frederic, MD;  Location: [facility];  Service: Orthopedics;  Laterality: Left;   AUGMENTATION MAMMAPLASTY Bilateral 1982/redo in 2014   prior mastectomy   CARDIAC CATHETERIZATION N/A 04/23/2016   Procedure: Left Heart Cath and Coronary  Angiography;  Surgeon: Lamar Blinks, MD;  Location: [facility];  Service: Cardiovascular;  Laterality: N/A;   CARDIAC CATHETERIZATION Left 04/23/2016   Procedure: Coronary Stent Intervention;  Surgeon: Alwyn Pea, MD;  Location: [facility];  Service: Cardiovascular;  Laterality: Left;   CAROTID ARTERY ANGIOPLASTY Left 2010   had stent inserted and removed d/t infection. artery from leg inserted in left carotid   CAROTID ENDARTERECTOMY Left 2010   left CEA ~ 2010, s/p excision infected left carotid patch & pseudoaneurysm with left CCA-ICA bypass using SVG 05/20/12   CHOLECYSTECTOMY     CORONARY ANGIOPLASTY WITH STENT PLACEMENT   september 9th 2017   CYSTOSCOPY W/ URETERAL STENT PLACEMENT Left 08/17/2017   Procedure: CYSTOSCOPY WITH RETROGRADE PYELOGRAM/URETERAL STENT PLACEMENT;  Surgeon: Jerilee Field, MD;  Location: [facility];  Service: Urology;  Laterality: Left;   CYSTOSCOPY W/ URETERAL STENT PLACEMENT Left 09/16/2017   Procedure: CYSTOSCOPY WITH STENT REPLACEMENT;  Surgeon: [name], MD;  Location: [facility];  Service: Urology;  Laterality: Left;   CYSTOSCOPY/RETROGRADE/URETEROSCOPY/STONE EXTRACTION WITH BASKET Left 09/16/2017   Procedure: CYSTOSCOPY/RETROGRADE/URETEROSCOPY/STONE EXTRACTION WITH BASKET;  Surgeon: [name], MD;  Location: [facility];  Service: Urology;  Laterality: Left;   EXTERNAL FIXATION LEG Left 02/24/2021   Procedure: EXTERNAL FIXATION  ANKLE;  Surgeon: Samson Frederic, MD;  [facility];  Service: Orthopedics;  Laterality: Left;   EXTERNAL FIXATION REMOVAL Left 02/26/2021   Procedure: REMOVAL EXTERNAL FIXATION LEG;  Surgeon: Roby Lofts, MD;  Location: [facility];  Service: Orthopedics;  Laterality: Left;   EYE SURGERY Bilateral    cataract extraction   I & D EXTREMITY Left 02/24/2021   Procedure: IRRIGATION AND DEBRIDEMENT EXTREMITY;  Surgeon: Samson Frederic, MD;  [facility]: [facility];  Service: Orthopedics;  Laterality: Left;   I & D EXTREMITY Left 12/21/2021   Procedure: IRRIGATION AND DEBRIDEMENT LEFT ANKLE;  Surgeon: Roby Lofts, MD;  Location: [facility];  Service: Orthopedics;  Laterality: Left;   I & D EXTREMITY Left 05/03/2022   Procedure: DEBRIDEMENT OF LEFT ANKLE INFECTION;  Surgeon: Roby Lofts, MD;  Location: [facility];  Service: Orthopedics;  Laterality: Left;   IR FLUORO GUIDE CV LINE LEFT  12/27/2021   IR FLUORO GUIDE CV LINE RIGHT  10/12/2021   IR REMOVAL TUN CV CATH W/O FL  11/22/2021   IR REMOVAL TUN CV CATH W/O FL  10/10/2022   IR US GUIDE VASC ACCESS LEFT  12/27/2021   IR US GUIDE VASC ACCESS RIGHT  10/12/2021   KYPHOPLASTY N/A 02/03/2018   Procedure:  [id number];  Surgeon: Kennedy Bucker, MD;  Location: [facility];  Service: Orthopedics;  Laterality: N/A;   MASTECTOMY Right 1982   ORIF ANKLE FRACTURE Left 02/26/2021   Procedure: OPEN REDUCTION INTERNAL FIXATION (ORIF) ANKLE FRACTURE;  Surgeon: Roby Lofts, MD;  Location: [facility];  Service: Orthopedics;  Laterality: Left;   PACEMAKER INSERTION Left 07/03/2016   Procedure: INSERTION PACEMAKER;  Surgeon: Marcina Millard, MD;  Location: [facility];  Service: Cardiovascular;  Laterality: Left;   SKIN CANCER EXCISION     TONSILLECTOMY     TUBAL LIGATION     VISCERAL ANGIOGRAPHY N/A 11/03/2019   Procedure: VISCERAL ANGIOGRAPHY;  Surgeon: Renford Dills, MD;  Location: [facility];  Service: Cardiovascular;  Laterality: N/A;    Prior to Admission medications   Medication Sig Start Date End Date Taking? Authorizing Provider  acetaminophen (TYLENOL) 325 MG tablet Take 1-2 tablets (  325-650 mg total) by mouth every 6 (six) hours as needed for mild pain (pain score 1-3 or temp > 100.5). 01/27/23  Yes Angiulli, Mcarthur Rossetti, PA-C  amitriptyline (ELAVIL) 10 MG tablet Take 1 tablet by mouth at bedtime. 03/10/23  Yes [provider]  apixaban (ELIQUIS) 2.5 MG TABS tablet Take 1 tablet (2.5 mg total) by mouth 2 (two) times daily. 01/28/23  Yes Angiulli, Mcarthur Rossetti, PA-C  clopidogrel (PLAVIX) 75 MG tablet Take 1 tablet (75 mg total) by mouth daily. 01/28/23  Yes Angiulli, Mcarthur Rossetti, PA-C  docusate sodium (COLACE) 100 MG capsule Take 1 capsule (100 mg total) by mouth 2 (two) times daily. 01/28/23  Yes Angiulli, Mcarthur Rossetti, PA-C  gabapentin (NEURONTIN) 100 MG capsule Take 1 capsule (100 mg total) by mouth 2 (two) times daily as needed (severe phantom limb pain). Patient taking differently: Take 100 mg by mouth daily as needed (severe phantom limb pain). 01/30/23  Yes Angiulli, Mcarthur Rossetti, PA-C  gabapentin (NEURONTIN) 100 MG capsule Take 1 capsule by mouth 3 (three) times daily. 02/26/23  Yes [provider]  hydrOXYzine (ATARAX) 10 MG tablet Take 1 tablet (10 mg total) by mouth 3 (three) times daily as needed. 07/01/23  Yes Kuppelweiser, Cassie L, RPH-CPP  levothyroxine (SYNTHROID) 112 MCG tablet Take by mouth. 02/13/23 02/13/24 Yes [provider]  Melatonin 10 MG CAPS Take 10 mg by mouth at bedtime as needed (sleep).   Yes [provider]  metFORMIN (GLUCOPHAGE) 500 MG tablet Take 1 tablet (500 mg total) by mouth 2 (two) times daily with a meal. 01/28/23  Yes Angiulli, Mcarthur Rossetti, PA-C  methocarbamol (ROBAXIN) 500 MG tablet Take 1 tablet (500 mg total) by mouth every 6 (six) hours as needed for muscle spasms. 01/28/23  Yes Angiulli, Mcarthur Rossetti, PA-C  metoprolol tartrate (LOPRESSOR) 25 MG tablet Take 1 tablet (25 mg total) by mouth 2 (two) times daily. 08/10/22 12/09/23 Yes Narda Bonds, MD  nitroGLYCERIN (NITROSTAT) 0.4 MG SL tablet Place 1 tablet (0.4 mg total) under the tongue every 5 (five) minutes as needed for chest pain. 04/24/16  Yes Mody, Patricia Pesa, MD  pregabalin (LYRICA) 25 MG capsule Take 1 capsule (25 mg total) by mouth at bedtime. 01/28/23  Yes Angiulli, Mcarthur Rossetti, PA-C  rosuvastatin (CRESTOR) 10 MG tablet Take 10 mg by mouth at bedtime. 09/04/22  Yes [provider]  telmisartan (MICARDIS) 40 MG tablet Take 40 mg by mouth daily.   Yes [provider]  torsemide (DEMADEX) 20 MG tablet Take 1 tablet (20 mg total) by mouth daily. 01/30/23  Yes Angiulli, Mcarthur Rossetti, PA-C  Vibegron (GEMTESA) 75 MG TABS Take 1 tablet (75 mg total) by mouth daily. 10/20/23  Yes Vaillancourt, Samantha, PA-C  VITAMIN D PO Take 1 capsule by mouth daily.   Yes [provider]  Vitamin D, Ergocalciferol, (DRISDOL) 1.25 MG (50000 UNIT) CAPS capsule Take 1 capsule (50,000 Units total) by mouth every 7 (seven) days. Once a week on Thursdays 02/06/23  Yes Love, Evlyn Kanner, PA-C  desvenlafaxine (PRISTIQ) 50 MG 24 hr tablet Take 100 mg by mouth every evening. Patient not taking: Reported  on 12/09/2023    [provider]  Multiple Vitamin (MULTIVITAMIN WITH MINERALS) TABS tablet Take 1 tablet by mouth daily. Patient not taking: Reported on 12/09/2023 09/24/17   Delfino Lovett, MD  pantoprazole (PROTONIX) 40 MG tablet Take 40 mg by mouth daily. Patient not taking: Reported on 12/09/2023    [provider]  traMADol (ULTRAM) 50 MG tablet Take 1 tablet (50 mg total) by mouth every 6 (six) hours as needed for moderate pain or severe pain. Patient not taking: Reported on 12/09/2023 01/28/23   Angiulli, Mcarthur Rossetti, PA-C    Family History  Problem Relation Age of Onset   Prostate cancer Neg Hx    Kidney cancer Neg Hx    Breast cancer Neg Hx      Social History   Tobacco Use   Smoking status: Former    Current packs/day: 0.00    Average packs/day: 0.5 packs/day for 1 year (0.5 ttl pk-yrs)    Types: Cigarettes    Start date: 62    Quit date: 12    Years since quitting: 69.3    Passive exposure: Past   Smokeless tobacco: Never  Vaping Use   Vaping status: Never Used  Substance Use Topics   Alcohol use: Yes    Comment: occasional wine   Drug use: Not Currently    Allergies as of 12/08/2023 - Review Complete 12/08/2023  Allergen Reaction Noted   Baclofen Other (See Comments) 06/07/2022   Levofloxacin Other (See Comments) 07/10/2021    Review of Systems:    All systems reviewed and negative except where noted in HPI.   Physical Exam:  Vital signs in last 24 hours: Temp:  [97.4 F (36.3 C)-97.8 F (36.6 C)] 97.5 F (36.4 C) (04/08 1234) Pulse Rate:  [63-87] 75 (04/08 1037) Resp:  [13-20] 20 (04/08 1037) BP: (98-146)/(60-77) 110/72 (04/08 1037) SpO2:  [91 %-99 %] 92 % (04/08 1037) Weight:  [57.2 kg] 57.2 kg (04/08 1234)   General:   Pleasant, cooperative in NAD Head:  Normocephalic and atraumatic. Eyes:   No icterus.   Conjunctiva pink. PERRLA. Ears:  Normal auditory acuity. Neck:  Supple; no masses or thyroidomegaly Lungs: Patient on supplemental  oxygen with decreased breath sounds and diffuse rhonchi Heart:  Regular rate and rhythm;  Without murmur, clicks, rubs or gallops Abdomen:  Soft, nondistended, nontender. Normal bowel sounds. No appreciable masses or hepatomegaly.  No rebound or guarding.  Rectal:  Not performed. Msk:  Symmetrical without gross deformities.    Extremities:  Without edema, cyanosis or clubbing. Neurologic:  Alert and oriented x3;  grossly normal neurologically. Skin:  Intact without significant lesions or rashes. Cervical Nodes:  No significant cervical adenopathy. Psych:  Alert and cooperative. Normal affect.  LAB RESULTS: Recent Labs    12/08/23 1616 12/09/23 0547 12/09/23 0854  WBC 10.7* QUESTIONABLE RESULTS, RECOMMEND RECOLLECT TO VERIFY 9.3  HGB 10.9* QUESTIONABLE RESULTS, RECOMMEND RECOLLECT TO VERIFY 11.1*  HCT 35.1* QUESTIONABLE RESULTS, RECOMMEND RECOLLECT TO VERIFY 35.1*  PLT 269 QUESTIONABLE RESULTS, RECOMMEND RECOLLECT TO VERIFY 248   BMET Recent Labs    12/08/23 1616 12/09/23 0547  NA 138 136  K 5.9* 3.9  CL 105 108  CO2 24 19*  GLUCOSE 315* 195*  BUN 36* 30*  CREATININE 1.49* 1.21*  CALCIUM 9.5 8.9   LFT Recent Labs    12/08/23 1616  PROT 6.9  ALBUMIN 2.8*  AST 22  ALT 19  ALKPHOS 94  BILITOT 0.7  BILIDIR <0.1  IBILI NOT CALCULATED   PT/INR No results for input(s): "LABPROT", "INR" in the last 72 hours.  STUDIES: No results found.    Impression / Plan:   Assessment: Principal Problem:   Dysphagia Active Problems:   Hyperkalemia   Paroxysmal atrial fibrillation (HCC)   Acute kidney injury superimposed on chronic kidney disease (HCC)  Type 2 diabetes mellitus with peripheral neuropathy (HCC)   Hypothyroidism   Dyslipidemia   Cassandra [name] is a 89 y.o. y/o female with chronic dysphagia.  The patient has had evaluation by speech pathology and continues to have issues with dysphagia.  There was a report of this likely to be due to esophageal  dysmotility by speech pathology but the patient had refused a 13 mm tablet at the last modified barium swallow.  The patient continues to be high risk for general anesthesia due to to her underlying comorbidities.  Plan:  The patient and her husband have been explained the past findings and that an upper endoscopy would be recommended only if there was a stricture or pathology seen on the barium swallow.  Therefore I will set the patient up for a barium swallow to look for any strictures or narrowing or any other possible causes for her reports of dysphagia.  The patient and her husband have been explained the plan and agree with it.  Thank you for involving me in the care of this patient.      LOS: 0 days   Cassandra Minium, MD, MD. Clementeen Graham 12/09/2023, 12:44 PM,  Pager [phone] 7am-5pm  Check AMION for 5pm -7am coverage and on weekends   Note: This dictation was prepared with Dragon dictation along with smaller phrase technology. Any transcriptional errors that result from this process are unintentional.

## 2023-12-09 NOTE — ED Notes (Signed)
 Pt ABCs intact. RR even and unlabored. Pt reported needing to be cleaned up after feeling like her adult brief and Pure Wick were soiled. After this RN obtained assistance to check pt. Pt was found to have dry brief and linens. Pt repositioned in the bed for comfort. Pt in NAD. Bed in lowest locked position. Call bell in reach. Denies needs at this time.

## 2023-12-09 NOTE — ED Notes (Signed)
 Pt complaining that her [name] was not placed correctly. RN went to assess placement and found that the pt had pulled the [name] out of brief. RN secured [name] with paper tape and asked pt to refrain from excessive movement or tugging at the [employer].

## 2023-12-09 NOTE — Plan of Care (Signed)
  Problem: Nutrition: Goal: Adequate nutrition will be maintained Outcome: Progressing   Problem: Coping: Goal: Level of anxiety will decrease Outcome: Progressing   Problem: Pain Managment: Goal: General experience of comfort will improve and/or be controlled Outcome: Progressing   Problem: Safety: Goal: Ability to remain free from injury will improve Outcome: Progressing

## 2023-12-10 ENCOUNTER — Inpatient Hospital Stay

## 2023-12-10 ENCOUNTER — Ambulatory Visit

## 2023-12-10 DIAGNOSIS — R1310 Dysphagia, unspecified: Secondary | ICD-10-CM | POA: Diagnosis not present

## 2023-12-10 DIAGNOSIS — K224 Dyskinesia of esophagus: Secondary | ICD-10-CM

## 2023-12-10 LAB — COMPREHENSIVE METABOLIC PANEL WITH GFR
ALT: 15 U/L (ref 0–44)
AST: 20 U/L (ref 15–41)
Albumin: 2.6 g/dL — ABNORMAL LOW (ref 3.5–5.0)
Alkaline Phosphatase: 78 U/L (ref 38–126)
Anion gap: 8 (ref 5–15)
BUN: 21 mg/dL (ref 8–23)
CO2: 21 mmol/L — ABNORMAL LOW (ref 22–32)
Calcium: 8.7 mg/dL — ABNORMAL LOW (ref 8.9–10.3)
Chloride: 108 mmol/L (ref 98–111)
Creatinine, Ser: 1.19 mg/dL — ABNORMAL HIGH (ref 0.44–1.00)
GFR, Estimated: 44 mL/min — ABNORMAL LOW (ref >60)
Glucose, Bld: 84 mg/dL (ref 70–99)
Potassium: 4 mmol/L (ref 3.5–5.1)
Sodium: 137 mmol/L (ref 135–145)
Total Bilirubin: 0.6 mg/dL (ref 0.0–1.2)
Total Protein: 6.5 g/dL (ref 6.5–8.1)

## 2023-12-10 LAB — RESP PANEL BY RT-PCR (RSV, FLU A&B, COVID)  RVPGX2
Influenza A by PCR: NEGATIVE
Influenza B by PCR: NEGATIVE
Resp Syncytial Virus by PCR: NEGATIVE
SARS Coronavirus 2 by RT PCR: NEGATIVE

## 2023-12-10 LAB — HEMOGLOBIN A1C
Hgb A1c MFr Bld: 12.5 % — ABNORMAL HIGH (ref 4.8–5.6)
Mean Plasma Glucose: 312 mg/dL

## 2023-12-10 LAB — T4, FREE: Free T4: 1.71 ng/dL — ABNORMAL HIGH (ref 0.61–1.12)

## 2023-12-10 LAB — CBC WITH DIFFERENTIAL/PLATELET
Abs Immature Granulocytes: 0.06 10*3/uL (ref 0.00–0.07)
Basophils Absolute: 0.1 10*3/uL (ref 0.0–0.1)
Basophils Relative: 1 %
Eosinophils Absolute: 0.6 10*3/uL — ABNORMAL HIGH (ref 0.0–0.5)
Eosinophils Relative: 5 %
HCT: 32.2 % — ABNORMAL LOW (ref 36.0–46.0)
Hemoglobin: 10.1 g/dL — ABNORMAL LOW (ref 12.0–15.0)
Immature Granulocytes: 1 %
Lymphocytes Relative: 21 %
Lymphs Abs: 2.3 10*3/uL (ref 0.7–4.0)
MCH: 23.2 pg — ABNORMAL LOW (ref 26.0–34.0)
MCHC: 31.4 g/dL (ref 30.0–36.0)
MCV: 73.9 fL — ABNORMAL LOW (ref 80.0–100.0)
Monocytes Absolute: 1.2 10*3/uL — ABNORMAL HIGH (ref 0.1–1.0)
Monocytes Relative: 11 %
Neutro Abs: 6.5 10*3/uL (ref 1.7–7.7)
Neutrophils Relative %: 61 %
Platelets: 253 10*3/uL (ref 150–400)
RBC: 4.36 MIL/uL (ref 3.87–5.11)
RDW: 17.2 % — ABNORMAL HIGH (ref 11.5–15.5)
WBC: 10.7 10*3/uL — ABNORMAL HIGH (ref 4.0–10.5)
nRBC: 0 % (ref 0.0–0.2)

## 2023-12-10 LAB — BLOOD GAS, VENOUS
Acid-base deficit: 3 mmol/L — ABNORMAL HIGH (ref 0.0–2.0)
Bicarbonate: 22.7 mmol/L (ref 20.0–28.0)
O2 Saturation: 95.9 %
Patient temperature: 37
pCO2, Ven: 42 mmHg — ABNORMAL LOW (ref 44–60)
pH, Ven: 7.34 (ref 7.25–7.43)
pO2, Ven: 72 mmHg — ABNORMAL HIGH (ref 32–45)

## 2023-12-10 LAB — GLUCOSE, CAPILLARY
Glucose-Capillary: 156 mg/dL — ABNORMAL HIGH (ref 70–99)
Glucose-Capillary: 175 mg/dL — ABNORMAL HIGH (ref 70–99)
Glucose-Capillary: 117 mg/dL — ABNORMAL HIGH (ref 70–99)
Glucose-Capillary: 219 mg/dL — ABNORMAL HIGH (ref 70–99)

## 2023-12-10 LAB — PHOSPHORUS: Phosphorus: 3.2 mg/dL (ref 2.5–4.6)

## 2023-12-10 LAB — TSH: TSH: 0.01 u[IU]/mL — ABNORMAL LOW (ref 0.350–4.500)

## 2023-12-10 LAB — MAGNESIUM: Magnesium: 1.5 mg/dL — ABNORMAL LOW (ref 1.7–2.4)

## 2023-12-10 MED ORDER — LORAZEPAM 2 MG/ML IJ SOLN: 1.0000 mg | Freq: Once | INTRAMUSCULAR | Status: DC | PRN

## 2023-12-10 MED ORDER — MAGNESIUM SULFATE 2 GM/50ML IV SOLN
2.0000 g | Freq: Once | INTRAVENOUS | Status: AC
Start: 2023-12-10 — End: 2023-12-10
  Administered 2023-12-10: 2 g via INTRAVENOUS
  Filled 2023-12-10: qty 50

## 2023-12-10 NOTE — Progress Notes (Addendum)
 PROGRESS NOTE    Cassandra [name]  HQI:[id number] DOB: 04-06-1933 DOA: 12/08/2023 PCP: [name], MD  Chief Complaint  Patient presents with   Urinary Retention    Hospital Course:  Cassandra [name] is 89 y.o. female with anxiety, depression, type 2 diabetes, status post left BKA, CAD, COPD, hypothyroidism, OSA, chronic dysphagia with acute worsening.  Patient has been following outpatient with gastroenterology and has had prior barium swallows are unremarkable.  She had planned for repeat barium swallow but has not had this done yet. Patient also endorses low urinary output but dry cough and choking sensation. In the ER she was found to be hyperkalemic, hyperglycemic, creatinine 1.49, BUN 36.  Patient was given 1 L NS, Lokelma, D50 insulin.  Subjective: Confused, no complaints   Objective: Vitals:   12/09/23 2336 12/10/23 0350 12/10/23 0739 12/10/23 1151  BP: (!) 157/66 (!) 145/62 127/80 (!) 158/53  Pulse: 69 70 67 64  Resp:  16 17 18   Temp: 97.8 F (36.6 C) 97.7 F (36.5 C) 98.3 F (36.8 C) 97.7 F (36.5 C)  TempSrc: Oral Oral    SpO2:   99% 92%  Weight:      Height:        Intake/Output Summary (Last 24 hours) at 12/10/2023 1428 Last data filed at 12/10/2023 1300 Gross per 24 hour  Intake 970.38 ml  Output --  Net 970.38 ml   Filed Weights   12/09/23 1234  Weight: 57.2 kg    Examination: General exam: Appears calm and comfortable, NAD Respiratory system: No work of breathing, symmetric chest wall expansion, ctab Cardiovascular system: S1 & S2 heard, RRR.  Gastrointestinal system: Abdomen is nondistended, soft and nontender.  Neuro: Alert and oriented to self, non-focal. Extremities: Symmetric, expected ROM. S/p left aka Skin: No rashes, lesions Psychiatry: calm, confused  Assessment & Plan:  Principal Problem:   Dysphagia Active Problems:   Hyperkalemia   Acute kidney injury superimposed on chronic kidney disease (HCC)   AKI (acute kidney  injury) (HCC)   Paroxysmal atrial fibrillation (HCC)   Type 2 diabetes mellitus with peripheral neuropathy (HCC)   Hypothyroidism   Dyslipidemia    Dysphagia - This is a chronic problem since at least 2023 - Has been following with gastroenterology, Dr. Tobi Bastos outpatient - dr. Servando Snare consulted here, barium swallow yesterday shows presbyesophagus with mod to severe dysomotility, no evidence stricture. Nothing for gi to do - slp eval  Delirium No uremia, doesn't appear infected, no electrolyte disturbances, bicarb not elevated, doesn't appear at high risk for wernicke. B12 wnl. Husband says was like this last week, resolved, now 4 days of this. Ct 2023 showed subacute stroke. Tsh is low - ammonia - covid/rsv/flu panel - vbg - t4 - am cortisol - ct head (has pacemaker)  Hyperkalemia - Resolved, status post Lokelma and insulin  CKD stage IIIb stable  Paroxysmal atrial fibrillation - Continue on Eliquis.  Left BKA - stable. .  OSA - Does not use CPAP at home  Combined systolic and diastolic heart failure Appears euvolemic   Hypothyroid Tsh is low - hold home levo - f/u t4    DVT prophylaxis: Eliquis   Code Status: Full Code Family Communication:  husband rudy updated telephonically 4/9 Disposition: Inpatient, working up delirium today  Consultants: gi   Procedures:  esophogram  Antimicrobials:  Anti-infectives (From admission, onward)    None       Data Reviewed: I have personally reviewed following labs and imaging  studies CBC: Recent Labs  Lab 12/08/23 1616 12/09/23 0547 12/09/23 0854 12/10/23 0409  WBC 10.7* QUESTIONABLE RESULTS, RECOMMEND RECOLLECT TO VERIFY 9.3 10.7*  NEUTROABS  --   --   --  6.5  HGB 10.9* QUESTIONABLE RESULTS, RECOMMEND RECOLLECT TO VERIFY 11.1* 10.1*  HCT 35.1* QUESTIONABLE RESULTS, RECOMMEND RECOLLECT TO VERIFY 35.1* 32.2*  MCV 75.0* QUESTIONABLE RESULTS, RECOMMEND RECOLLECT TO VERIFY 74.2* 73.9*  PLT 269 QUESTIONABLE  RESULTS, RECOMMEND RECOLLECT TO VERIFY 248 253   Basic Metabolic Panel: Recent Labs  Lab 12/08/23 1616 12/09/23 0547 12/10/23 0409  NA 138 136 137  K 5.9* 3.9 4.0  CL 105 108 108  CO2 24 19* 21*  GLUCOSE 315* 195* 84  BUN 36* 30* 21  CREATININE 1.49* 1.21* 1.19*  CALCIUM 9.5 8.9 8.7*  MG  --   --  1.5*  PHOS  --   --  3.2   GFR: Estimated Creatinine Clearance: 24.7 mL/min (A) (by C-G formula based on SCr of 1.19 mg/dL (H)). Liver Function Tests: Recent Labs  Lab 12/08/23 1616 12/10/23 0409  AST 22 20  ALT 19 15  ALKPHOS 94 78  BILITOT 0.7 0.6  PROT 6.9 6.5  ALBUMIN 2.8* 2.6*   CBG: Recent Labs  Lab 12/09/23 0551 12/09/23 2021 12/09/23 2334 12/10/23 0740 12/10/23 1205  GLUCAP 206* 301* 283* 156* 175*    No results found for this or any previous visit (from the past 240 hours).   Radiology Studies: DG ESOPHAGUS W SINGLE CM (SOL OR THIN BA) Result Date: 12/09/2023 CLINICAL DATA:  89 year old female with difficulty swallowing, and reflux with vomiting. EXAM: ESOPHAGUS/BARIUM SWALLOW/TABLET STUDY TECHNIQUE: Single contrast examination was performed using thin liquid barium. This exam was performed by Buzzy Han, PA-C, and was supervised and interpreted by Dr. Roque Lias, MD. FLUOROSCOPY: Radiation Exposure Index (as provided by the fluoroscopic device): 27.20 mGy Kerma COMPARISON:  DG swallow function Medicare speech pathology on 08/16/2022 DG esophagus with double contrast media on 11/14/2021 FINDINGS: Swallowing: Appears normal. No vestibular penetration or aspiration seen. Pharynx: Unremarkable. Esophagus: Presbyesophagus Esophageal motility: Moderate to severe esophageal dysmotility with retropulsion and tertiary contractions Hiatal Hernia: None. Gastroesophageal reflux: Unable to assess due to low contrast volume in stomach. Ingested 13mm barium tablet: Became stuck Other: Study severely limited by patient's inability to stand, repositioned nor swallow thin liquids.  Patient tilted at less than 45 degrees. IMPRESSION: 1.  Presbyesophagus. Moderate to severe dysmotility. 2. Moderate to severe dysmotility with retropulsion and notable tertiary contractions. 3.  Unable to assess for reflux due to low gastric contrast volume. 4. Notable delay in passing of barium tablet, greater than 30 seconds. No evidence of stricture. Procedure performed by Buzzy Han, PA-C Electronically Signed   By: Lupita Raider M.D.   On: 12/09/2023 14:13    Scheduled Meds:  apixaban  2.5 mg Oral BID   cholecalciferol  1,000 Units Oral Daily   clopidogrel  75 mg Oral Daily   docusate sodium  100 mg Oral BID   insulin aspart  0-15 Units Subcutaneous TID AC & HS   levothyroxine  112 mcg Oral Q0600   metoprolol tartrate  25 mg Oral BID   mirabegron ER  25 mg Oral Daily   multivitamin with minerals  1 tablet Oral Daily   pantoprazole  40 mg Oral Daily   rosuvastatin  10 mg Oral QHS   venlafaxine XR  75 mg Oral Q breakfast   Continuous Infusions:  magnesium sulfate bolus IVPB  LOS: 1 day    Silvano Bilis, MD [facility]  To contact the attending physician between 7A-7P please use Epic Chat. To contact the covering physician during after hours 7P-7A, please review Amion.   12/10/2023, 2:28 PM

## 2023-12-10 NOTE — Progress Notes (Signed)
 Patient refused all evening medications. She was encouraged to take them but became agitated. She stated that I was neither her doctor or a pharmacist, therefore a nurse was not qualified to give her any medications. The medications were placed in the waste container by the Py[facility].

## 2023-12-10 NOTE — Progress Notes (Signed)
 [name], MD [facility]   [address] [place], [place] [address] Phone: [phone] Fax : [phone]   Subjective: This patient was seen for chronic dysphagia.  The patient was sent for a barium swallow yesterday that showed:  IMPRESSION: 1.  Presbyesophagus. Moderate to severe dysmotility.   2. Moderate to severe dysmotility with retropulsion and notable tertiary contractions.   3.  Unable to assess for reflux due to low gastric contrast volume.   4. Notable delay in passing of barium tablet, greater than 30 seconds. No evidence of stricture.  The patient has been seen by speech pathology in the past with recommendations given at that time.  She is now sitting up in bed eating eggs and toast without any issues.   Objective: Vital signs in last 24 hours: Vitals:   12/09/23 2142 12/09/23 2336 12/10/23 0350 12/10/23 0739  BP: 139/70 (!) 157/66 (!) 145/62 127/80  Pulse: 63 69 70 67  Resp:   16 17  Temp:  97.8 F (36.6 C) 97.7 F (36.5 C) 98.3 F (36.8 C)  TempSrc:  Oral Oral   SpO2:    99%  Weight:      Height:       Weight change:   Intake/Output Summary (Last 24 hours) at 12/10/2023 0840 Last data filed at 12/09/2023 2030 Gross per 24 hour  Intake 1533.25 ml  Output --  Net 1533.25 ml     Exam: Heart:: Regular rate and rhythm or without murmur or extra heart sounds Lungs: normal and clear to auscultation and percussion Abdomen: soft, nontender, normal bowel sounds   Lab Results: @LABTEST2 @ Micro Results: No results found for this or any previous visit (from the past 240 hours). Studies/Results: DG ESOPHAGUS W SINGLE CM (SOL OR THIN BA) Result Date: 12/09/2023 CLINICAL DATA:  89 year old female with difficulty swallowing, and reflux with vomiting. EXAM: ESOPHAGUS/BARIUM SWALLOW/TABLET STUDY TECHNIQUE: Single contrast examination was performed using thin liquid barium. This exam was performed by Buzzy Han, PA-C, and was supervised and  interpreted by Dr. Roque Lias, MD. FLUOROSCOPY: Radiation Exposure Index (as provided by the fluoroscopic device): 27.20 mGy Kerma COMPARISON:  DG swallow function [employer] speech pathology on 08/16/2022 DG esophagus with double contrast media on 11/14/2021 FINDINGS: Swallowing: Appears normal. No vestibular penetration or aspiration seen. Pharynx: Unremarkable. Esophagus: Presbyesophagus Esophageal motility: Moderate to severe esophageal dysmotility with retropulsion and tertiary contractions Hiatal Hernia: None. Gastroesophageal reflux: Unable to assess due to low contrast volume in stomach. Ingested 13mm barium tablet: Became stuck Other: Study severely limited by patient's inability to stand, repositioned nor swallow thin liquids. Patient tilted at less than 45 degrees. IMPRESSION: 1.  Presbyesophagus. Moderate to severe dysmotility. 2. Moderate to severe dysmotility with retropulsion and notable tertiary contractions. 3.  Unable to assess for reflux due to low gastric contrast volume. 4. Notable delay in passing of barium tablet, greater than 30 seconds. No evidence of stricture. Procedure performed by Buzzy Han, PA-C Electronically Signed   By: Lupita Raider M.D.   On: 12/09/2023 14:13   Medications: I have reviewed the patient's current medications. Scheduled Meds:  apixaban  2.5 mg Oral BID   cholecalciferol  1,000 Units Oral Daily   clopidogrel  75 mg Oral Daily   docusate sodium  100 mg Oral BID   insulin aspart  0-15 Units Subcutaneous TID AC & HS   levothyroxine  112 mcg Oral Q0600   metoprolol tartrate  25 mg Oral BID   mirabegron ER  25 mg Oral Daily   multivitamin with minerals  1 tablet Oral Daily   pantoprazole  40 mg Oral Daily   rosuvastatin  10 mg Oral QHS   venlafaxine XR  75 mg Oral Q breakfast   Continuous Infusions: PRN Meds:.acetaminophen **OR** acetaminophen, gabapentin, hydrOXYzine, magnesium hydroxide, melatonin, methocarbamol, nitroGLYCERIN, ondansetron **OR**  ondansetron (ZOFRAN) IV, traMADol, traZODone   Assessment: Principal Problem:   Dysphagia Active Problems:   AKI (acute kidney injury) (HCC)   Hyperkalemia   Paroxysmal atrial fibrillation (HCC)   Acute kidney injury superimposed on chronic kidney disease (HCC)   Type 2 diabetes mellitus with peripheral neuropathy (HCC)   Hypothyroidism   Dyslipidemia    Plan: This patient has moderate to severe esophageal dysmotility with tertiary contractions without a stricture or narrowing seen.  None of this is amenable to any endoscopic intervention and if she is not doing well with her present recommendations from speech pathology then a repeat evaluation with speech pathology may be warranted.  Nothing further to do from a GI point of view.  I will sign off.  Please call if any further GI concerns or questions.  We would like to thank you for the opportunity to participate in the care of Cassandra [name].    LOS: 1 day   [name], MD.FACG 12/10/2023, 8:40 AM Pager [phone] 7am-5pm  Check AMION for 5pm -7am coverage and on weekends

## 2023-12-10 NOTE — Plan of Care (Signed)
  Problem: Nutrition: Goal: Adequate nutrition will be maintained Outcome: Progressing   Problem: Skin Integrity: Goal: Risk for impaired skin integrity will decrease Outcome: Progressing   

## 2023-12-11 ENCOUNTER — Ambulatory Visit: Payer: Medicare Other | Admitting: Physical Therapy

## 2023-12-11 DIAGNOSIS — R1310 Dysphagia, unspecified: Secondary | ICD-10-CM | POA: Diagnosis not present

## 2023-12-11 LAB — CBC WITH DIFFERENTIAL/PLATELET
Abs Immature Granulocytes: 0.06 10*3/uL (ref 0.00–0.07)
Basophils Absolute: 0.1 10*3/uL (ref 0.0–0.1)
Basophils Relative: 1 %
Eosinophils Absolute: 0.4 10*3/uL (ref 0.0–0.5)
Eosinophils Relative: 4 %
HCT: 34.2 % — ABNORMAL LOW (ref 36.0–46.0)
Hemoglobin: 10.8 g/dL — ABNORMAL LOW (ref 12.0–15.0)
Immature Granulocytes: 1 %
Lymphocytes Relative: 17 %
Lymphs Abs: 1.8 10*3/uL (ref 0.7–4.0)
MCH: 22.9 pg — ABNORMAL LOW (ref 26.0–34.0)
MCHC: 31.6 g/dL (ref 30.0–36.0)
MCV: 72.5 fL — ABNORMAL LOW (ref 80.0–100.0)
Monocytes Absolute: 1 10*3/uL (ref 0.1–1.0)
Monocytes Relative: 9 %
Neutro Abs: 7.1 10*3/uL (ref 1.7–7.7)
Neutrophils Relative %: 68 %
Platelets: 283 10*3/uL (ref 150–400)
RBC: 4.72 MIL/uL (ref 3.87–5.11)
RDW: 17.7 % — ABNORMAL HIGH (ref 11.5–15.5)
WBC: 10.4 10*3/uL (ref 4.0–10.5)
nRBC: 0 % (ref 0.0–0.2)

## 2023-12-11 LAB — GLUCOSE, CAPILLARY
Glucose-Capillary: 189 mg/dL — ABNORMAL HIGH (ref 70–99)
Glucose-Capillary: 182 mg/dL — ABNORMAL HIGH (ref 70–99)
Glucose-Capillary: 167 mg/dL — ABNORMAL HIGH (ref 70–99)
Glucose-Capillary: 73 mg/dL (ref 70–99)
Glucose-Capillary: 112 mg/dL — ABNORMAL HIGH (ref 70–99)

## 2023-12-11 LAB — COMPREHENSIVE METABOLIC PANEL WITH GFR
ALT: 16 U/L (ref 0–44)
AST: 19 U/L (ref 15–41)
Albumin: 2.9 g/dL — ABNORMAL LOW (ref 3.5–5.0)
Alkaline Phosphatase: 77 U/L (ref 38–126)
Anion gap: 11 (ref 5–15)
BUN: 24 mg/dL — ABNORMAL HIGH (ref 8–23)
CO2: 22 mmol/L (ref 22–32)
Calcium: 9.4 mg/dL (ref 8.9–10.3)
Chloride: 102 mmol/L (ref 98–111)
Creatinine, Ser: 1.14 mg/dL — ABNORMAL HIGH (ref 0.44–1.00)
GFR, Estimated: 46 mL/min — ABNORMAL LOW (ref >60)
Glucose, Bld: 191 mg/dL — ABNORMAL HIGH (ref 70–99)
Potassium: 3.9 mmol/L (ref 3.5–5.1)
Sodium: 135 mmol/L (ref 135–145)
Total Bilirubin: 0.7 mg/dL (ref 0.0–1.2)
Total Protein: 7 g/dL (ref 6.5–8.1)

## 2023-12-11 LAB — MAGNESIUM: Magnesium: 2.1 mg/dL (ref 1.7–2.4)

## 2023-12-11 LAB — PHOSPHORUS: Phosphorus: 3.3 mg/dL (ref 2.5–4.6)

## 2023-12-11 LAB — CORTISOL-AM, BLOOD: Cortisol - AM: 15.6 ug/dL (ref 6.7–22.6)

## 2023-12-11 MED ORDER — HALOPERIDOL LACTATE 5 MG/ML IJ SOLN
1.0000 mg | Freq: Once | INTRAMUSCULAR | Status: AC
  Administered 2023-12-11: 1 mg via INTRAVENOUS
  Filled 2023-12-11 (×2): qty 1

## 2023-12-11 MED ORDER — INSULIN GLARGINE-YFGN 100 UNIT/ML ~~LOC~~ SOLN
5.0000 [IU] | Freq: Every day | SUBCUTANEOUS | Status: DC
  Administered 2023-12-11 – 2023-12-13 (×3): 5 [IU] via SUBCUTANEOUS
  Filled 2023-12-11 (×4): qty 0.05

## 2023-12-11 MED ORDER — CEPHALEXIN 500 MG PO CAPS
500.0000 mg | ORAL_CAPSULE | ORAL | Status: DC
  Administered 2023-12-11 – 2023-12-14 (×4): 500 mg via ORAL
  Filled 2023-12-11 (×4): qty 1

## 2023-12-11 MED ORDER — ENSURE ENLIVE PO LIQD
237.0000 mL | Freq: Two times a day (BID) | ORAL | Status: DC
  Administered 2023-12-11 – 2023-12-15 (×6): 237 mL via ORAL

## 2023-12-11 NOTE — Evaluation (Addendum)
 Clinical/Bedside Swallow Evaluation Patient Details  Name: Cassandra [name] MRN: [id number] Date of Birth: [birth year]  Today's Date: 12/11/2023 Time: SLP Start Time (ACUTE ONLY): 1000 SLP Stop Time (ACUTE ONLY): 1100 SLP Time Calculation (min) (ACUTE ONLY): 60 min  Past Medical History:  Past Medical History:  Diagnosis Date   Acquired hypothyroidism 01/06/2014   Anxiety    Arthritis    B12 deficiency 02/15/2014   Benign essential hypertension 01/06/2014   Breast cancer (HCC) 1982   Right breast cancer - chemotherapy   CAD (coronary artery disease), native coronary artery 01/06/2014   Carotid artery calcification    Chronic airway obstruction, not elsewhere classified 01/06/2014   Chronic mesenteric ischemia (HCC)    s/p SMA stent 11/03/19   Depression    Diabetes mellitus without complication (HCC)    Dysrhythmia    aflutter with RVR 06/2016   History of kidney stones 2019   left ureteral stone   Incontinence of urine    Myocardial infarction (HCC) 1982   small infarct   Neuromuscular disorder (HCC)    restless legs   Pacemaker    Presence of permanent cardiac pacemaker 07/2016   Pure hypercholesterolemia 01/06/2014   Renal insufficiency    Skin cancer    Sleep apnea    no longer uses a cpap. Uses oxygen as needed   Past Surgical History:  Past Surgical History:  Procedure Laterality Date   AMPUTATION Left 01/03/2023   Procedure: AMPUTATION BELOW KNEE;  Surgeon: Roby Lofts, MD;  Location: [facility];  Service: Orthopedics;  Laterality: Left;   ANKLE FUSION Left 10/10/2021   Procedure: ANKLE FUSION;  Surgeon: Roby Lofts, MD;  Location: [facility];  Service: Orthopedics;  Laterality: Left;   APPENDECTOMY     APPLICATION OF WOUND VAC Left 02/24/2021   Procedure: APPLICATION OF WOUND VAC;  Surgeon: Samson Frederic, MD;  Location: [facility];  Service: Orthopedics;  Laterality: Left;   AUGMENTATION MAMMAPLASTY Bilateral 1982/redo in 2014   prior mastectomy   CARDIAC  CATHETERIZATION N/A 04/23/2016   Procedure: Left Heart Cath and Coronary Angiography;  Surgeon: Lamar Blinks, MD;  Location: [facility];  Service: Cardiovascular;  Laterality: N/A;   CARDIAC CATHETERIZATION Left 04/23/2016   Procedure: Coronary Stent Intervention;  Surgeon: Alwyn Pea, MD;  Location: [facility];  Service: Cardiovascular;  Laterality: Left;   CAROTID ARTERY ANGIOPLASTY Left 2010   had stent inserted and removed d/t infection. artery from leg inserted in left carotid   CAROTID ENDARTERECTOMY Left 2010   left CEA ~ 2010, s/p excision infected left carotid patch & pseudoaneurysm with left CCA-ICA bypass using SVG 05/20/12   CHOLECYSTECTOMY     CORONARY ANGIOPLASTY WITH STENT PLACEMENT  september 9th 2017   CYSTOSCOPY W/ URETERAL STENT PLACEMENT Left 08/17/2017   Procedure: CYSTOSCOPY WITH RETROGRADE PYELOGRAM/URETERAL STENT PLACEMENT;  Surgeon: Jerilee Field, MD;  Location: [facility];  Service: Urology;  Laterality: Left;   CYSTOSCOPY W/ URETERAL STENT PLACEMENT Left 09/16/2017   Procedure: CYSTOSCOPY WITH STENT REPLACEMENT;  Surgeon: [name], MD;  Location: [facility];  Service: Urology;  Laterality: Left;   CYSTOSCOPY/RETROGRADE/URETEROSCOPY/STONE EXTRACTION WITH BASKET Left 09/16/2017   Procedure: CYSTOSCOPY/RETROGRADE/URETEROSCOPY/STONE EXTRACTION WITH BASKET;  Surgeon: [name], MD;  Location: [facility];  Service: Urology;  Laterality: Left;   EXTERNAL FIXATION LEG Left 02/24/2021   Procedure: EXTERNAL FIXATION  ANKLE;  Surgeon: Samson Frederic, MD;  [facility];  Service: Orthopedics;  Laterality: Left;  EXTERNAL FIXATION REMOVAL Left 02/26/2021   Procedure: REMOVAL EXTERNAL FIXATION LEG;  Surgeon: Roby Lofts, MD;  [facility];  Service: Orthopedics;  Laterality: Left;   EYE SURGERY Bilateral    cataract extraction   I & D EXTREMITY Left 02/24/2021   Procedure: IRRIGATION AND DEBRIDEMENT EXTREMITY;  Surgeon:  Samson Frederic, MD;  [facility];  Service: Orthopedics;  Laterality: Left;   I & D EXTREMITY Left 12/21/2021   Procedure: IRRIGATION AND DEBRIDEMENT LEFT ANKLE;  Surgeon: Roby Lofts, MD;  [facility];  Service: Orthopedics;  Laterality: Left;   I & D EXTREMITY Left 05/03/2022   Procedure: DEBRIDEMENT OF LEFT ANKLE INFECTION;  Surgeon: Roby Lofts, MD;  [facility];  Service: Orthopedics;  Laterality: Left;   IR FLUORO GUIDE CV LINE LEFT  12/27/2021   IR FLUORO GUIDE CV LINE RIGHT  10/12/2021   IR REMOVAL TUN CV CATH W/O FL  11/22/2021   IR REMOVAL TUN CV CATH W/O FL  10/10/2022   IR US GUIDE VASC ACCESS LEFT  12/27/2021   IR US GUIDE VASC ACCESS RIGHT  10/12/2021   KYPHOPLASTY N/A 02/03/2018   Procedure: [id number];  Surgeon: Kennedy Bucker, MD;  Location: [facility];  Service: Orthopedics;  Laterality: N/A;   MASTECTOMY Right 1982   ORIF ANKLE FRACTURE Left 02/26/2021   Procedure: OPEN REDUCTION INTERNAL FIXATION (ORIF) ANKLE FRACTURE;  Surgeon: Roby Lofts, MD;  [facility]: [facility];  Service: Orthopedics;  Laterality: Left;   PACEMAKER INSERTION Left 07/03/2016   Procedure: INSERTION PACEMAKER;  Surgeon: Marcina Millard, MD;  Location: [facility];  Service: Cardiovascular;  Laterality: Left;   SKIN CANCER EXCISION     TONSILLECTOMY     TUBAL LIGATION     VISCERAL ANGIOGRAPHY N/A 11/03/2019   Procedure: VISCERAL ANGIOGRAPHY;  Surgeon: Renford Dills, MD;  Location: [facility];  Service: Cardiovascular;  Laterality: N/A;   HPI:  Pt is 89 y.o. female with anxiety, depression, type 2 diabetes, status post left BKA, CAD, COPD, hypothyroidism, OSA, chronic Esiogaeal phase dysphagia with acute worsening.  Patient has been following outpatient with gastroenterology and has had prior barium swallows w/ the most recent one performed this hospitalization.   Per OutPatient chart notes from her MDs: "pt has CHRONIC Gastroesophageal Reflux Disease (GERD) and LPRD". Many  things have been discussed and Education given per OutPatient visits to MDs including:  "Chronic acid reflux exacerbated by wine consumption, causing nighttime reflux and coughing. Acid-reducing medication to improve symptoms and allow wine consumption without suffering.  - Prescribe acid-reducing medication, to be taken once in the morning on an empty stomach.  - Follow-up in one month to assess symptom improvement.    Xerostomia  Persistent dry mouth and lips, leading to cracked lips, burning sensation, and difficulty swallowing, especially with acidic beverages. No current medications identified as the cause. Discussed medication to increase saliva production.  - Prescribe medication to increase saliva production, to be taken three times a day.  - Follow-up in one month to assess symptom improvement.  Dysphagia  difficulty swallowing, likely related to Xerostomia and GERD.     Pt's DG Esophagus this hospitalization on 12/09/2023 indicated:  "Presbyesophagus. Moderate to severe dysmotility.    2. Moderate to severe dysmotility with retropulsion and notable  tertiary contractions.    3. Unable to assess for reflux due to low gastric contrast volume.    4. Notable delay in passing of barium tablet.  Swallowing: Appears Normal.  No vestibular penetration nor aspiration seen. Pharynx: Unremarkable.".    CT of the Abd on 11/11/2023: Lower chest:  No acute abnormality.     PER NURSING NOTE 12/11/2023: "This patient is Combative. She attempts to hit staff during care. She tries to pinch, bite and scratch. The makes verbal threats. She has refused all oral medications so we have not been able to help her calm down. She has been awake all night talking to people who are not there.". (underlying COGNITIVE decline?- addressed w/ MD) PER GI NOTE 12/10/2023" "patient has moderate to severe esophageal dysmotility with tertiary contractions without a stricture or narrowing seen. None of this is amenable to any endoscopic  intervention. Nothing further to do from a GI point of view."; during GI visit that date: "She is now sitting up in bed eating eggs and toast without any issues.".     Assessment / Plan / Recommendation  Clinical Impression   Pt seen for BSE. Per chart: pt has CHRONIC Gastroesophageal Reflux Disease (GERD) and LPRD; Presbyesophagus. Moderate to severe dysmotility and Moderate to Severe dysmotility with retropulsion and notable tertiary contractions.   At this BSE, pt awake, tangential and Confused -- often talking to other "people" in the room(and looking in different directions at "them"). Pt followed basic instructions w/ cue; quite distracted at times and constant chatter. NSG reported same last night and this morning while attempting to give morning meds. When meeting w/ her later, she asked if this SLP was "at the [facility] last night at the larger banquet w/ baked chicken and pork served; lots of people from [place] attended"- Husband was present at that visit and agreed w/ her during her conversation.  On RA, afebrile. WBC WNL.  Pt appears to present w/ grossly functional oropharyngeal phase swallowing w/ No overt oropharyngeal phase dysphagia noted, No neuromuscular deficits noted. Pt consumed po trials w/ No immediate, overt clinical s/s of aspiration during the trials.  Pt appears at reduced risk for aspiration from an oropharyngeal phase standpoint when following general aspiration precautions. HOWEVER, pt has DIAGNOSED ESOPHAGEAL PHASE DYSPHAGIA AND DYSMOTILITY as well as other factors that could impact her oropharyngeal swallowing including the ESOPHAGEAL PHASE DYSPHAGIA/DYSMOTILITY, advanced Age, deconditioning /weakness, and COGNTIVE DECLINE which impacted her follow through w/ all tasks including taking/swallowing Medication and self-feeding. These factors can increase risk for dysphagia/aspiration as well as decreased oral intake overall. ANY COGNITIVE DECLINE can impact awareness/timing of  swallowing during oral intake and increase risk for choking/dysphagia. Unsure of pt's premorbid Cognitive functioning.   During po trials of those she accepted, pt consumed all consistencies w/ no immediate, overt coughing, decline in vocal quality, or change in respiratory presentation during/post trials. Mild throat clear occurred but was not consistency nor did it increase in frequency w/ oral intake. O2 sats 99%. Oral phase appeared grossly [facility] w/ timely bolus management, mastication, and control of bolus propulsion for A-P transfer for swallowing. She was distracted at times and was unaware of food residue on chin. Oral clearing achieved w/ all trial consistencies given Time -- moistened, soft foods given.  OM Exam appeared [facility] w/ no unilateral weakness noted. Speech Clear. Pt fed self w/ setup/support. Dentures noted.  WITH NSG present: pt's Meds were CRUSHED per SLP's recommendation and mixed in yogurt consistency; pt helped to feed self and swallowed the CRUSHED Pills. This was further recommended to NSG.  Recommend a more Mech Soft consistency diet w/ MINCED meats and well-moistened foods to aid Esophageal phase clearing; Thin  liquids -- less straw use d/t air swallowed; pt should Hold Cup when drinking. Recommend general aspiration precautions to include Small bites/sips Slowly and clear mouth b/t bites. REDUCE DISTRACTIONS DURING MEALS. Tray setup and sitting support. STRICT REFLUX PRECAUTIONS W/ ORAL INTAKE. Time b/t bites/sips to allow for clearing.  Pills CRUSHED vs WHOLE in Puree for safer, easier swallowing and Esophageal clearing -- it was encouraged now and for D/C to the NSG.  Education given on Pills in Puree; food consistencies and easy to eat options; general aspiration precautions; REFLUX PRECAUTIONS to pt, NSG.   No skilled ST services indicated in setting of pt's CHRONIC, KNOWN Esophageal phase Dysphagia. Pt appears at her Baseline and is tolerating some po's in small amounts.  Recommend DIETICIAN f/u for support- a drink supplement. Ongoing f/u w/ GI for Education on her Chronic Dysmotility and its impact on her overall oral intake. MD/NSG to reconsult if any new needs arise during admit. NSG updated, agreed. MD updated. Recommend Palliative Care f/u for support and Education- GOC moving forward w/ pt's Comorbidities. Recommend Neurology consult for formal assessment of pt's COGNITIVE FUNCTIONING in order to determine Baseline status, POC ahead.  SLP Visit Diagnosis: Dysphagia, unspecified (R13.10) (Esophageal phase Dysphagia documented/dx'd per GI; Cognitive Decline present)    Aspiration Risk   (REFLUX aspiration risk present d/t Esophageal phase Dysmotility)    Diet Recommendation   Thin;Dysphagia 3 (mechanical soft) (MINCED meats w/ gravies for support of Esophageal phase clearing) = a more Mech Soft consistency diet w/ MINCED meats and well-moistened foods to aid Esophageal phase clearing; Thin liquids -- less straw use d/t air swallowed; pt should Hold Cup when drinking. Recommend general aspiration precautions to include Small bites/sips Slowly and clear mouth b/t bites. REDUCE DISTRACTIONS DURING MEALS. Tray setup and sitting support. STRICT REFLUX PRECAUTIONS W/ ORAL INTAKE. Time b/t bites/sips to allow for clearing.  Medication Administration: Crushed with puree (for ease of Esophageal clearing and d/t Cognitive Decline)    Other  Recommendations Recommended Consults: Consider GI evaluation;Consider esophageal assessment (have been following pt- signed off in hospital)see Note)) Oral Care Recommendations: Oral care BID;Oral care before and after PO;Staff/trained caregiver to provide oral care ([employer] care)    Recommendations for follow up therapy are one component of a multi-disciplinary discharge planning process, led by the attending physician.  Recommendations may be updated based on patient status, additional functional criteria and insurance  authorization.  Follow up Recommendations No SLP follow up      Assistance Recommended at Discharge  FULL during meals  Functional Status Assessment Patient has had a recent decline in their functional status and/or demonstrates limited ability to make significant improvements in function in a reasonable and predictable amount of time  Frequency and Duration  (n/a)   (n/a)       Prognosis Prognosis for improved oropharyngeal function: Fair Barriers to Reach Goals: Cognitive deficits;Time post onset;Severity of deficits;Behavior Barriers/Prognosis Comment: MOD-SEVERE Esophageal phase Dysmotility -- Esophageal phase Dysphagia at Baseline.  Cognitive decline Baseline?      Swallow Study   General Date of Onset: 12/08/23 HPI: Pt is 89 y.o. female with anxiety, depression, type 2 diabetes, status post left BKA, CAD, COPD, hypothyroidism, OSA, chronic Esiogaeal phase dysphagia with acute worsening.  Patient has been following outpatient with gastroenterology and has had prior barium swallows w/ the most recent one performed this hospitalization.  Per OutPatient chart notes from her MDs: "pt has CHRONIC Gastroesophageal Reflux Disease (GERD) and LPRD". Many things have been discussed  and Education given per OutPatient visits to MDs including:  "Chronic acid reflux exacerbated by wine consumption, causing nighttime reflux and coughing. Acid-reducing medication to improve symptoms and allow wine consumption without suffering.  - Prescribe acid-reducing medication, to be taken once in the morning on an empty stomach.  - Follow-up in one month to assess symptom improvement.    Xerostomia  Persistent dry mouth and lips, leading to cracked lips, burning sensation, and difficulty swallowing, especially with acidic beverages. No current medications identified as the cause. Discussed medication to increase saliva production.  - Prescribe medication to increase saliva production, to be taken three times a day.   - Follow-up in one month to assess symptom improvement.    Dysphagia  Difficulty swallowing, likely related to xerostomia and GERD.   Pt's DG Esophagus this hospitalization on 12/09/2023 indicated:  "Presbyesophagus. Moderate to severe dysmotility.    2. Moderate to severe dysmotility with retropulsion and notable  tertiary contractions.    3. Unable to assess for reflux due to low gastric contrast volume.    4. Notable delay in passing of barium tablet.  Swallowing: Appears Normal.  No vestibular penetration nor aspiration seen. Pharynx: Unremarkable.".    CT of the Abd on 11/11/2023: Lower chest:  No acute abnormality.   PER NURSING NOTE 12/11/2023: "This patient is combative. She attempts to hit staff during care. She tries to pinch, bite and scratch. The makes verbal threats. She has refused all oral medications so we have not been able to help her calm down. She has been awake all night talking to people who are not there.". Type of Study: Bedside Swallow Evaluation Previous Swallow Assessment: multiple swallowing assessments including MBSS in 2022, 2023, and 2024 all recommending a Regular consistency diet w/ thin liquids- WFL toleration of oral diet per SLPs. Diet Prior to this Study: Regular;Thin liquids (Level 0) Temperature Spikes Noted: No (wbc 10.4) Respiratory Status: Room air History of Recent Intubation: No Behavior/Cognition: Alert;Cooperative;Pleasant mood;Confused;Distractible;Requires cueing (Baseline Cogntiive Decline??) Oral Cavity Assessment: Within Functional Limits Oral Care Completed by SLP: Recent completion by staff Oral Cavity - Dentition:  (Dentures) Vision: Functional for self-feeding Self-Feeding Abilities: Able to feed self;Needs set up (reduce distractions) Patient Positioning: Upright in bed (needed more support for upright sitting- pillow lower behind back) Baseline Vocal Quality: Normal Volitional Cough: Strong Volitional Swallow: Able to elicit    Oral/Motor/Sensory  Function Overall Oral Motor/Sensory Function: Within functional limits   Ice Chips Ice chips: Not tested   Thin Liquid Thin Liquid: Within functional limits Presentation: Cup;Self Fed;Straw (4-5+ trials via each method) Other Comments: mild throat clearing x1-2 but not consistent nor immediate    Nectar Thick Nectar Thick Liquid: Not tested   Honey Thick Honey Thick Liquid: Not tested   Puree Puree: Within functional limits Presentation: Self Fed;Spoon (7-8 trials) Other Comments: yogurt consistency   Solid     Solid: Within functional limits Presentation: Self Fed;Spoon (3 trials) Other Comments: egg, muffin       Jerilynn Som, MS, CCC-SLP Speech Language Pathologist Rehab Services; [facility] [place] [phone] (ascom) Jenkins Risdon 12/11/2023,5:07 PM

## 2023-12-11 NOTE — Inpatient Diabetes Management (Signed)
 Inpatient Diabetes Program Recommendations  AACE/ADA: New Consensus Statement on Inpatient Glycemic Control (2015)  Target Ranges:  Prepandial:   less than 140 mg/dL      Peak postprandial:   less than 180 mg/dL (1-2 hours)      Critically ill patients:  140 - 180 mg/dL   Lab Results  Component Value Date   GLUCAP 189 (H) 12/11/2023   HGBA1C 12.5 (H) 12/10/2023    Review of Glycemic Control  Latest Reference Range & Units 12/09/23 23:34 12/10/23 07:40 12/10/23 12:05 12/10/23 16:56 12/10/23 21:09 12/11/23 09:29  Glucose-Capillary 70 - 99 mg/dL 130 (H) 865 (H) 784 (H) 117 (H) 219 (H) 189 (H)   Diabetes history: DM 2 Outpatient Diabetes medications:  Metformin 500 mg bid Current orders for Inpatient glycemic control:  Novolog 0-15 units tid with meals and HS  Inpatient Diabetes Program Recommendations:    Note elevated A1C of 12.5%.  May benefit from low dose basal insulin to prevent hyperglycemia.  Consider adding Semglee 8 units daily.  Also consider changing HS scale to  bedtime scale to reduce coverage at bedtime.   Thanks,  Lorenza Cambridge, RN, BC-ADM Inpatient Diabetes Coordinator Pager [phone]  (8a-5p)

## 2023-12-11 NOTE — Plan of Care (Signed)
  Problem: Health Behavior/Discharge Planning: Goal: Ability to manage health-related needs will improve Outcome: Not Progressing   Problem: Clinical Measurements: Goal: Ability to maintain clinical measurements within normal limits will improve Outcome: Not Progressing Goal: Will remain free from infection Outcome: Progressing Goal: Diagnostic test results will improve Outcome: Progressing Goal: Respiratory complications will improve Outcome: Progressing Goal: Cardiovascular complication will be avoided Outcome: Progressing   Problem: Activity: Goal: Risk for activity intolerance will decrease Outcome: Progressing   Problem: Nutrition: Goal: Adequate nutrition will be maintained Outcome: Progressing   Problem: Coping: Goal: Level of anxiety will decrease Outcome: Not Progressing   Problem: Elimination: Goal: Will not experience complications related to bowel motility Outcome: Progressing Goal: Will not experience complications related to urinary retention Outcome: Progressing   Problem: Pain Managment: Goal: General experience of comfort will improve and/or be controlled Outcome: Progressing   Problem: Safety: Goal: Ability to remain free from injury will improve Outcome: Progressing   Problem: Skin Integrity: Goal: Risk for impaired skin integrity will decrease Outcome: Progressing   Problem: Education: Goal: Ability to describe self-care measures that may prevent or decrease complications (Diabetes Survival Skills Education) will improve Outcome: Progressing Goal: Individualized Educational Video(s) Outcome: Progressing   Problem: Coping: Goal: Ability to adjust to condition or change in health will improve Outcome: Progressing   Problem: Fluid Volume: Goal: Ability to maintain a balanced intake and output will improve Outcome: Progressing   Problem: Health Behavior/Discharge Planning: Goal: Ability to identify and utilize available resources and  services will improve Outcome: Not Progressing Goal: Ability to manage health-related needs will improve Outcome: Not Progressing   Problem: Metabolic: Goal: Ability to maintain appropriate glucose levels will improve Outcome: Progressing   Problem: Nutritional: Goal: Maintenance of adequate nutrition will improve Outcome: Progressing Goal: Progress toward achieving an optimal weight will improve Outcome: Progressing   Problem: Skin Integrity: Goal: Risk for impaired skin integrity will decrease Outcome: Progressing   Problem: Tissue Perfusion: Goal: Adequacy of tissue perfusion will improve Outcome: Progressing

## 2023-12-11 NOTE — Progress Notes (Signed)
 PROGRESS NOTE    Cassandra [name]  ZOX:[id number] DOB: 23-May-1933 DOA: 12/08/2023 PCP: [name], MD    Hospital Course:  Cassandra [name] is 89 y.o. female with anxiety, depression, type 2 diabetes, status post left BKA, CAD, COPD, hypothyroidism, OSA, chronic dysphagia with acute worsening.  Patient has been following outpatient with gastroenterology and has had prior barium swallows are unremarkable.  She had planned for repeat barium swallow but has not had this done yet. Patient also endorses low urinary output but dry cough and choking sensation. In the ER she was found to be hyperkalemic, hyperglycemic, creatinine 1.49, BUN 36.  Patient was given 1 L NS, Lokelma, D50 insulin.  Subjective: Confused, no complaints   Objective: Vitals:   12/10/23 1656 12/10/23 2033 12/11/23 0404 12/11/23 0751  BP: (!) 143/82 (!) 161/118 (!) 188/69 (!) 157/85  Pulse: 71 77 77 79  Resp: (!) 24 18 19 18   Temp: 98.3 F (36.8 C) 97.9 F (36.6 C) 98.1 F (36.7 C) 97.6 F (36.4 C)  TempSrc:      SpO2: 98% 100% 95% 94%  Weight:      Height:        Intake/Output Summary (Last 24 hours) at 12/11/2023 1222 Last data filed at 12/10/2023 1800 Gross per 24 hour  Intake 290 ml  Output --  Net 290 ml   Filed Weights   12/09/23 1234  Weight: 57.2 kg    Examination: General exam: Appears calm and comfortable, NAD Respiratory system: No work of breathing, symmetric chest wall expansion, ctab Cardiovascular system: S1 & S2 heard, RRR.  Gastrointestinal system: Abdomen is nondistended, soft and nontender.  Neuro: Alert and oriented to self, non-focal. Extremities: Symmetric, expected ROM. S/p left aka Skin: No rashes, lesions Psychiatry: calm, confused  Assessment & Plan:  Principal Problem:   Dysphagia Active Problems:   Hyperkalemia   Acute kidney injury superimposed on chronic kidney disease (HCC)   AKI (acute kidney injury) (HCC)   Paroxysmal atrial fibrillation (HCC)   Type  2 diabetes mellitus with peripheral neuropathy (HCC)   Hypothyroidism   Dyslipidemia    Dysphagia - This is a chronic problem since at least 2023 - Has been following with gastroenterology, Dr. Tobi Bastos outpatient - dr. Servando Snare consulted here, barium swallow yesterday shows presbyesophagus with mod to severe dysomotility, no evidence stricture. Nothing for gi to do - slp eval pending  Delirium No uremia, doesn't appear infected, no electrolyte disturbances, bicarb not elevated, doesn't appear at high risk for wernicke. B12 wnl. Husband says was like this last week, resolved, now 4 days of this. Ct 2023 showed subacute stroke. Hypothyroidism is overtreated as below. Ua from 4/8 does show bacteriuria, this could be uti. Am cortisol relatively normal, vbg unremarkable. Respiratory panel neg. Ct head nothing acute (has pacemaker, can't get mri) - will check urine culture and blood cultures, will order abx for possible uti once culture has been sent - PT consult pending - hold prn tramadol  Hyperkalemia - Resolved, status post Lokelma and insulin  CKD stage IIIb stable  Paroxysmal atrial fibrillation - Continue on Eliquis.  Left BKA - stable.   OSA - Does not use CPAP at home  Combined systolic and diastolic heart failure Appears euvolemic   Hypothyroid Tsh is low and t4 elevated - hold home levo, would reduce dose at discharge    DVT prophylaxis: Eliquis   Code Status: Full Code Family Communication:  daughter updated telephonically 4/10 Disposition: Inpatient pending pt eval  Consultants: gi   Procedures:  esophogram  Antimicrobials:  Anti-infectives (From admission, onward)    None       Data Reviewed: I have personally reviewed following labs and imaging studies CBC: Recent Labs  Lab 12/08/23 1616 12/09/23 0547 12/09/23 0854 12/10/23 0409 12/11/23 0552  WBC 10.7* QUESTIONABLE RESULTS, RECOMMEND RECOLLECT TO VERIFY 9.3 10.7* 10.4  NEUTROABS  --   --   --   6.5 7.1  HGB 10.9* QUESTIONABLE RESULTS, RECOMMEND RECOLLECT TO VERIFY 11.1* 10.1* 10.8*  HCT 35.1* QUESTIONABLE RESULTS, RECOMMEND RECOLLECT TO VERIFY 35.1* 32.2* 34.2*  MCV 75.0* QUESTIONABLE RESULTS, RECOMMEND RECOLLECT TO VERIFY 74.2* 73.9* 72.5*  PLT 269 QUESTIONABLE RESULTS, RECOMMEND RECOLLECT TO VERIFY 248 253 283   Basic Metabolic Panel: Recent Labs  Lab 12/08/23 1616 12/09/23 0547 12/10/23 0409 12/11/23 0552  NA 138 136 137 135  K 5.9* 3.9 4.0 3.9  CL 105 108 108 102  CO2 24 19* 21* 22  GLUCOSE 315* 195* 84 191*  BUN 36* 30* 21 24*  CREATININE 1.49* 1.21* 1.19* 1.14*  CALCIUM 9.5 8.9 8.7* 9.4  MG  --   --  1.5* 2.1  PHOS  --   --  3.2 3.3   GFR: Estimated Creatinine Clearance: 25.8 mL/min (A) (by C-G formula based on SCr of 1.14 mg/dL (H)). Liver Function Tests: Recent Labs  Lab 12/08/23 1616 12/10/23 0409 12/11/23 0552  AST 22 20 19   ALT 19 15 16   ALKPHOS 94 78 77  BILITOT 0.7 0.6 0.7  PROT 6.9 6.5 7.0  ALBUMIN 2.8* 2.6* 2.9*   CBG: Recent Labs  Lab 12/10/23 1656 12/10/23 2109 12/11/23 0929 12/11/23 0936 12/11/23 1151  GLUCAP 117* 219* 189* 182* 167*    Recent Results (from the past 240 hours)  Resp panel by RT-PCR (RSV, Flu A&B, Covid) Anterior Nasal Swab     Status: None   Collection Time: 12/10/23  5:17 PM   Specimen: Anterior Nasal Swab  Result Value Ref Range Status   SARS Coronavirus 2 by RT PCR NEGATIVE NEGATIVE Final    Comment: (NOTE) SARS-CoV-2 target nucleic acids are NOT DETECTED.  The SARS-CoV-2 RNA is generally detectable in upper respiratory specimens during the acute phase of infection. The lowest concentration of SARS-CoV-2 viral copies this assay can detect is 138 copies/mL. A negative result does not preclude SARS-Cov-2 infection and should not be used as the sole basis for treatment or other patient management decisions. A negative result may occur with  improper specimen collection/handling, submission of specimen  other than nasopharyngeal swab, presence of viral mutation(s) within the areas targeted by this assay, and inadequate number of viral copies(<138 copies/mL). A negative result must be combined with clinical observations, patient history, and epidemiological information. The expected result is Negative.  Fact Sheet for Patients:  BloggerCourse.com  Fact Sheet for Healthcare Providers:  SeriousBroker.it  This test is no t yet approved or cleared by the [place] FDA and  has been authorized for detection and/or diagnosis of SARS-CoV-2 by FDA under an Emergency Use Authorization (EUA). This EUA will remain  in effect (meaning this test can be used) for the duration of the COVID-19 declaration under Section 564(b)(1) of the Act, 21 U.S.C.section 360bbb-3(b)(1), unless the authorization is terminated  or revoked sooner.       Influenza A by PCR NEGATIVE NEGATIVE Final   Influenza B by PCR NEGATIVE NEGATIVE Final    Comment: (NOTE) The Xpert Xpress SARS-CoV-2/FLU/RSV plus assay is intended as an  aid in the diagnosis of influenza from Nasopharyngeal swab specimens and should not be used as a sole basis for treatment. Nasal washings and aspirates are unacceptable for Xpert Xpress SARS-CoV-2/FLU/RSV testing.  Fact Sheet for Patients: BloggerCourse.com  Fact Sheet for Healthcare Providers: SeriousBroker.it  This test is not yet approved or cleared by the [place] FDA and has been authorized for detection and/or diagnosis of SARS-CoV-2 by FDA under an Emergency Use Authorization (EUA). This EUA will remain in effect (meaning this test can be used) for the duration of the COVID-19 declaration under Section 564(b)(1) of the Act, 21 U.S.C. section 360bbb-3(b)(1), unless the authorization is terminated or revoked.     Resp Syncytial Virus by PCR NEGATIVE NEGATIVE Final     Comment: (NOTE) Fact Sheet for Patients: BloggerCourse.com  Fact Sheet for Healthcare Providers: SeriousBroker.it  This test is not yet approved or cleared by the [place] FDA and has been authorized for detection and/or diagnosis of SARS-CoV-2 by FDA under an Emergency Use Authorization (EUA). This EUA will remain in effect (meaning this test can be used) for the duration of the COVID-19 declaration under Section 564(b)(1) of the Act, 21 U.S.C. section 360bbb-3(b)(1), unless the authorization is terminated or revoked.  Performed at [facility], [address] [place], [place] [address]      Radiology Studies: CT HEAD WO CONTRAST ( ) Result Date: 12/10/2023 CLINICAL DATA:  Initial evaluation for acute delirium, history of prior stroke. EXAM: CT HEAD WITHOUT CONTRAST TECHNIQUE: Contiguous axial images were obtained from the base of the skull through the vertex without intravenous contrast. RADIATION DOSE REDUCTION: This exam was performed according to the departmental dose-optimization program which includes automated exposure control, adjustment of the mA and/or kV according to patient size and/or use of iterative reconstruction technique. COMPARISON:  Prior study from 08/23/2022. FINDINGS: Brain: Cerebral volume within normal limits for age. Mild chronic microvascular ischemic disease noted involving the supratentorial cerebral white matter. Few small remote bilateral cerebellar infarcts noted. No acute intracranial hemorrhage. No acute large vessel territory infarct. No mass lesion or midline shift. Mild ventricular prominence related to global parenchymal volume loss without hydrocephalus. No extra-axial fluid collection. Empty sella noted. Vascular: No abnormal hyperdense vessel. Calcified atherosclerosis present at the skull base. Skull: Scalp soft tissues within normal limits.  Calvarium intact. Sinuses/Orbits: Globes  orbital soft tissues within normal limits. Paranasal sinuses are largely clear. No mastoid effusion. Other: None. IMPRESSION: 1. No acute intracranial abnormality. 2. Mild chronic microvascular ischemic disease with a few small remote bilateral cerebellar infarcts. Electronically Signed   By: Rise Mu M.D.   On: 12/10/2023 19:39   DG ESOPHAGUS W SINGLE CM (SOL OR THIN BA) Result Date: 12/09/2023 CLINICAL DATA:  89 year old female with difficulty swallowing, and reflux with vomiting. EXAM: ESOPHAGUS/BARIUM SWALLOW/TABLET STUDY TECHNIQUE: Single contrast examination was performed using thin liquid barium. This exam was performed by Buzzy Han, PA-C, and was supervised and interpreted by Dr. Roque Lias, MD. FLUOROSCOPY: Radiation Exposure Index (as provided by the fluoroscopic device): 27.20 mGy Kerma COMPARISON:  DG swallow function [employer] speech pathology on 08/16/2022 DG esophagus with double contrast media on 11/14/2021 FINDINGS: Swallowing: Appears normal. No vestibular penetration or aspiration seen. Pharynx: Unremarkable. Esophagus: Presbyesophagus Esophageal motility: Moderate to severe esophageal dysmotility with retropulsion and tertiary contractions Hiatal Hernia: None. Gastroesophageal reflux: Unable to assess due to low contrast volume in stomach. Ingested 13mm barium tablet: Became stuck Other: Study severely limited by patient's inability to stand, repositioned nor swallow thin  liquids. Patient tilted at less than 45 degrees. IMPRESSION: 1.  Presbyesophagus. Moderate to severe dysmotility. 2. Moderate to severe dysmotility with retropulsion and notable tertiary contractions. 3.  Unable to assess for reflux due to low gastric contrast volume. 4. Notable delay in passing of barium tablet, greater than 30 seconds. No evidence of stricture. Procedure performed by Buzzy Han, PA-C Electronically Signed   By: Lupita Raider M.D.   On: 12/09/2023 14:13    Scheduled Meds:  apixaban   2.5 mg Oral BID   cholecalciferol  1,000 Units Oral Daily   clopidogrel  75 mg Oral Daily   docusate sodium  100 mg Oral BID   haloperidol lactate  1 mg Intravenous Once   insulin aspart  0-15 Units Subcutaneous TID AC & HS   insulin glargine-yfgn  5 Units Subcutaneous QHS   metoprolol tartrate  25 mg Oral BID   mirabegron ER  25 mg Oral Daily   multivitamin with minerals  1 tablet Oral Daily   pantoprazole  40 mg Oral Daily   rosuvastatin  10 mg Oral QHS   venlafaxine XR  75 mg Oral Q breakfast   Continuous Infusions:     LOS: 2 days    Silvano Bilis, MD [facility]  To contact the attending physician between 7A-7P please use Epic Chat. To contact the covering physician during after hours 7P-7A, please review Amion.   12/11/2023, 12:22 PM

## 2023-12-11 NOTE — Evaluation (Signed)
 Physical Therapy Evaluation Patient Details Name: SALAYA HOLTROP MRN: [id number] DOB: 08/25/1933 Today's Date: 12/11/2023  History of Present Illness  LANIA ZAWISTOWSKI is a 89 y.o. Caucasian female with medical history significant for anxiety, depression, type 2 diabetes mellitus, s/p left BKA, coronary artery disease, COPD, hypothyroidism, OSA, who presented to the emergency room with acute onset of dysphagia that has been getting worse lately.  Clinical Impression  Patient received in bed talking to people that are not there. Bed soaking wet. She is HOH. She agrees to get up to [facility]. Patient seeing things and bugs during session. Requires a lot of cues for re-orientation and for following direction for mobility. Patient requires min A for bed mobility and min +2 A for stand pivot transfer to [facility] and back to bed. She is limited by cognitive status at this time. Patient will continue to benefit from skilled PT to improve safety and independence.         If plan is discharge home, recommend the following: A little help with bathing/dressing/bathroom;A lot of help with walking and/or transfers   Can travel by private vehicle    no    Equipment Recommendations Other (comment) (TBD)  Recommendations for Other Services       Functional Status Assessment Patient has had a recent decline in their functional status and demonstrates the ability to make significant improvements in function in a reasonable and predictable amount of time.     Precautions / Restrictions Precautions Precautions: Fall Recall of Precautions/Restrictions: Impaired Precaution/Restrictions Comments: L BKA ( old) Restrictions Weight Bearing Restrictions Per Provider Order: No      Mobility  Bed Mobility Overal bed mobility: Needs Assistance Bed Mobility: Supine to Sit, Sit to Supine     Supine to sit: Contact guard Sit to supine: Supervision   General bed mobility comments: patient very confused, not  allowing much assistance. Able to perform bed mobility mostly on her own.    Transfers Overall transfer level: Needs assistance Equipment used: None Transfers: Sit to/from Stand, Bed to chair/wheelchair/BSC Sit to Stand: Contact guard assist, +2 safety/equipment           General transfer comment: patient able to transfer to [facility] and back to bed with +2 min A for safety due to confusion    Ambulation/Gait               General Gait Details: not attempted, patient does not have prosthesis here.  Stairs            Wheelchair Mobility     Tilt Bed    Modified Rankin (Stroke Patients Only)       Balance Overall balance assessment: Needs assistance Sitting-balance support: Feet supported Sitting balance-Leahy Scale: Fair     Standing balance support: Bilateral upper extremity supported, During functional activity Standing balance-Leahy Scale: Fair                               Pertinent Vitals/Pain Pain Assessment Pain Assessment: No/denies pain    Home Living Family/patient expects to be discharged to:: Private residence Living Arrangements: Spouse/significant other Available Help at Discharge: Family;Available 24 hours/day Type of Home: Apartment Home Access: Level entry       Home Layout: One level Home Equipment: Shower seat;Wheelchair Financial trader (4 wheels);Rolling Walker (2 wheels);Cane - single point;Grab bars - toilet;BSC/3in1;Grab bars - tub/shower Additional Comments: pt reports having w/c, RW, rollator, BSC, TTB and  multiple canes. Reports using RW for hosehold ambulation and w/c for community level. will need to verify details with husband as no caregiver present and questionable historian- all history taken from prior admission 11 months ago. No family present and patient VERY confused at this assessment.    Prior Function Prior Level of Function : Needs assist             Mobility Comments: recent PT to start  walking on RW and uses wc for community ADLs Comments: Pt reporting he was performing BADLs.     Extremity/Trunk Assessment   Upper Extremity Assessment Upper Extremity Assessment: Overall WFL for tasks assessed    Lower Extremity Assessment Lower Extremity Assessment: Overall WFL for tasks assessed    Cervical / Trunk Assessment Cervical / Trunk Assessment: Normal  Communication   Communication Communication: Impaired Factors Affecting Communication: Hearing impaired    Cognition Arousal: Alert Behavior During Therapy: WFL for tasks assessed/performed   PT - Cognitive impairments: No family/caregiver present to determine baseline, Awareness, Attention, Initiation, Sequencing, Problem solving, Safety/Judgement                       PT - Cognition Comments: VERY confused with visual hallucinations Following commands: Impaired Following commands impaired: Follows one step commands with increased time     Cueing Cueing Techniques: Verbal cues, Gestural cues     General Comments      Exercises     Assessment/Plan    PT Assessment Patient needs continued PT services  PT Problem List Decreased activity tolerance;Decreased mobility;Decreased safety awareness;Decreased cognition       PT Treatment Interventions Functional mobility training;Therapeutic activities;Therapeutic exercise;Balance training;Patient/family education    PT Goals (Current goals can be found in the Care Plan section)  Acute Rehab PT Goals PT Goal Formulation: Patient unable to participate in goal setting Time For Goal Achievement: 12/18/23    Frequency Min 2X/week     Co-evaluation               AM-PAC PT "6 Clicks" Mobility  Outcome Measure Help needed turning from your back to your side while in a flat bed without using bedrails?: A Little Help needed moving from lying on your back to sitting on the side of a flat bed without using bedrails?: A Little Help needed moving  to and from a bed to a chair (including a wheelchair)?: A Lot Help needed standing up from a chair using your arms (e.g., wheelchair or bedside chair)?: A Lot Help needed to walk in hospital room?: Total Help needed climbing 3-5 steps with a railing? : Total 6 Click Score: 12    End of Session   Activity Tolerance: Other (comment) (limited by confusion) Patient left: in bed;with call bell/phone within reach;with bed alarm set Nurse Communication: Mobility status PT Visit Diagnosis: Unsteadiness on feet (R26.81);Other abnormalities of gait and mobility (R26.89)    Time: 1610-9604 PT Time Calculation (min) (ACUTE ONLY): 30 min   Charges:   PT Evaluation $PT Eval Moderate Complexity: 1 Mod PT Treatments $Therapeutic Activity: 8-22 mins PT General Charges $$ ACUTE PT VISIT: 1 Visit         Maryalice Pasley, PT, GCS 12/11/23,12:30 PM

## 2023-12-11 NOTE — Progress Notes (Signed)
 This patient is combative. She attempts to hit staff during care. She tries to pinch, bite and scratch. The makes verbal threats. She has refused all oral medications so we have not been able to help her calm down. She has been awake all night talking to people who are not there. Provider has been contacted for IV or IM medications since everything she currently has is oral.

## 2023-12-12 DIAGNOSIS — R1310 Dysphagia, unspecified: Secondary | ICD-10-CM | POA: Diagnosis not present

## 2023-12-12 LAB — COMPREHENSIVE METABOLIC PANEL WITH GFR
ALT: 15 U/L (ref 0–44)
AST: 18 U/L (ref 15–41)
Albumin: 2.8 g/dL — ABNORMAL LOW (ref 3.5–5.0)
Alkaline Phosphatase: 69 U/L (ref 38–126)
Anion gap: 10 (ref 5–15)
BUN: 24 mg/dL — ABNORMAL HIGH (ref 8–23)
CO2: 21 mmol/L — ABNORMAL LOW (ref 22–32)
Calcium: 8.8 mg/dL — ABNORMAL LOW (ref 8.9–10.3)
Chloride: 106 mmol/L (ref 98–111)
Creatinine, Ser: 1.11 mg/dL — ABNORMAL HIGH (ref 0.44–1.00)
GFR, Estimated: 48 mL/min — ABNORMAL LOW (ref >60)
Glucose, Bld: 242 mg/dL — ABNORMAL HIGH (ref 70–99)
Potassium: 3.7 mmol/L (ref 3.5–5.1)
Sodium: 137 mmol/L (ref 135–145)
Total Bilirubin: 0.6 mg/dL (ref 0.0–1.2)
Total Protein: 6.4 g/dL — ABNORMAL LOW (ref 6.5–8.1)

## 2023-12-12 LAB — CBC WITH DIFFERENTIAL/PLATELET
Abs Immature Granulocytes: 0.08 10*3/uL — ABNORMAL HIGH (ref 0.00–0.07)
Basophils Absolute: 0.1 10*3/uL (ref 0.0–0.1)
Basophils Relative: 1 %
Eosinophils Absolute: 0.5 10*3/uL (ref 0.0–0.5)
Eosinophils Relative: 5 %
HCT: 34.2 % — ABNORMAL LOW (ref 36.0–46.0)
Hemoglobin: 10.6 g/dL — ABNORMAL LOW (ref 12.0–15.0)
Immature Granulocytes: 1 %
Lymphocytes Relative: 20 %
Lymphs Abs: 1.9 10*3/uL (ref 0.7–4.0)
MCH: 23.2 pg — ABNORMAL LOW (ref 26.0–34.0)
MCHC: 31 g/dL (ref 30.0–36.0)
MCV: 74.8 fL — ABNORMAL LOW (ref 80.0–100.0)
Monocytes Absolute: 1.1 10*3/uL — ABNORMAL HIGH (ref 0.1–1.0)
Monocytes Relative: 11 %
Neutro Abs: 6.3 10*3/uL (ref 1.7–7.7)
Neutrophils Relative %: 62 %
Platelets: 268 10*3/uL (ref 150–400)
RBC: 4.57 MIL/uL (ref 3.87–5.11)
RDW: 18.3 % — ABNORMAL HIGH (ref 11.5–15.5)
WBC: 10 10*3/uL (ref 4.0–10.5)
nRBC: 0 % (ref 0.0–0.2)

## 2023-12-12 LAB — PHOSPHORUS: Phosphorus: 4.1 mg/dL (ref 2.5–4.6)

## 2023-12-12 LAB — GLUCOSE, CAPILLARY
Glucose-Capillary: 172 mg/dL — ABNORMAL HIGH (ref 70–99)
Glucose-Capillary: 99 mg/dL (ref 70–99)
Glucose-Capillary: 205 mg/dL — ABNORMAL HIGH (ref 70–99)
Glucose-Capillary: 332 mg/dL — ABNORMAL HIGH (ref 70–99)

## 2023-12-12 LAB — MAGNESIUM: Magnesium: 2 mg/dL (ref 1.7–2.4)

## 2023-12-12 MED ORDER — NON FORMULARY: 1.0000 | Status: DC | PRN

## 2023-12-12 MED ORDER — CARMEX CLASSIC LIP BALM EX OINT
TOPICAL_OINTMENT | CUTANEOUS | Status: DC | PRN
  Filled 2023-12-12 (×2): qty 1

## 2023-12-12 NOTE — Progress Notes (Signed)
 PT Cancellation Note  Patient Details Name: Cassandra [name] MRN: [id number] DOB: [birth year]   Cancelled Treatment:    Reason Eval/Treat Not Completed: Other (comment). RN reports patient has been very agitated today. Will hold PT for today and re-attempt when patient appropriate.    Keyana Guevara 12/12/2023, 11:17 AM

## 2023-12-12 NOTE — Progress Notes (Signed)
 PROGRESS NOTE    Cassandra [name]  ZOX:[id number] DOB: 08-Jan-1933 DOA: 12/08/2023 PCP: [name], MD    Hospital Course:  Cassandra [name] is 89 y.o. female with anxiety, depression, type 2 diabetes, status post left BKA, CAD, COPD, hypothyroidism, OSA, chronic dysphagia with acute worsening.  Patient has been following outpatient with gastroenterology and has had prior barium swallows are unremarkable.  She had planned for repeat barium swallow but has not had this done yet. Patient also endorses low urinary output but dry cough and choking sensation. In the ER she was found to be hyperkalemic, hyperglycemic, creatinine 1.49, BUN 36.  Patient was given 1 L NS, Lokelma, D50 insulin.  Subjective: Less confused today, no complaints   Objective: Vitals:   12/11/23 2200 12/12/23 0502 12/12/23 0807 12/12/23 1156  BP: (!) 176/88 (!) 175/68 (!) 181/84 128/74  Pulse: 79 76 73 68  Resp: 18 18 16 17   Temp: 97.8 F (36.6 C) 97.7 F (36.5 C) 98.5 F (36.9 C) 98.7 F (37.1 C)  TempSrc: Oral Oral  Oral  SpO2: 95% 97% 98% 97%  Weight:      Height:       No intake or output data in the 24 hours ending 12/12/23 1216  Filed Weights   12/09/23 1234  Weight: 57.2 kg    Examination: General exam: Appears calm and comfortable, NAD Respiratory system: No work of breathing, symmetric chest wall expansion, ctab Cardiovascular system: S1 & S2 heard, RRR.  Gastrointestinal system: Abdomen is nondistended, soft and nontender.  Neuro: Alert and oriented to self and place, moving all 4 Extremities: Symmetric, expected ROM. S/p left aka Skin: No rashes, lesions Psychiatry: calm, mild confusion  Assessment & Plan:  Principal Problem:   Dysphagia Active Problems:   Hyperkalemia   Acute kidney injury superimposed on chronic kidney disease (HCC)   AKI (acute kidney injury) (HCC)   Paroxysmal atrial fibrillation (HCC)   Type 2 diabetes mellitus with peripheral neuropathy (HCC)    Hypothyroidism   Dyslipidemia    Dysphagia - This is a chronic problem since at least 2023 - Has been following with gastroenterology, Dr. Tobi Bastos outpatient - dr. Servando Snare consulted here, barium swallow yesterday shows presbyesophagus with mod to severe dysomotility, no evidence stricture. Nothing for gi to do - slp evaluated and cleared for current dysphagia 3 diet  Delirium UTI? No uremia, doesn't appear infected, no electrolyte disturbances, bicarb not elevated, doesn't appear at high risk for wernicke. B12 wnl. Husband says was like this last week, resolved, now 4 days of this. Ct 2023 showed subacute stroke. Hypothyroidism is overtreated as below. Ua from 4/8 does show bacteriuria, this could be uti. Am cortisol relatively normal, vbg unremarkable. Respiratory panel neg. Ct head nothing acute (has pacemaker, can't get mri). Delirium is improving this morning, we did start keflex yesterday evening - continue keflex, f/u culture results - hold prn tramadol  Hyperkalemia - Resolved, status post Lokelma and insulin  CKD stage IIIb stable  Paroxysmal atrial fibrillation - Continue on Eliquis.  Left BKA - stable.   OSA - Does not use CPAP at home  Combined systolic and diastolic heart failure Appears euvolemic   Hypothyroid Tsh is low and t4 elevated - hold home levo, would reduce dose at discharge  [facility] PT advising SNF and husband agreeable, has snf bed, pending insurance auth    DVT prophylaxis: Eliquis   Code Status: Full Code Family Communication:  husband updated @ bedside 4/11 Disposition: snf pending  insurance auth  Consultants: gi (signed off)   Procedures:  esophogram  Antimicrobials:  Anti-infectives (From admission, onward)    Start     Dose/Rate Route Frequency Ordered Stop   12/11/23 2200  cephALEXin (KEFLEX) capsule 500 mg        500 mg Oral Every 24 hours 12/11/23 1740         Data Reviewed: I have personally reviewed following labs and  imaging studies CBC: Recent Labs  Lab 12/09/23 0547 12/09/23 0854 12/10/23 0409 12/11/23 0552 12/12/23 0410  WBC QUESTIONABLE RESULTS, RECOMMEND RECOLLECT TO VERIFY 9.3 10.7* 10.4 10.0  NEUTROABS  --   --  6.5 7.1 6.3  HGB QUESTIONABLE RESULTS, RECOMMEND RECOLLECT TO VERIFY 11.1* 10.1* 10.8* 10.6*  HCT QUESTIONABLE RESULTS, RECOMMEND RECOLLECT TO VERIFY 35.1* 32.2* 34.2* 34.2*  MCV QUESTIONABLE RESULTS, RECOMMEND RECOLLECT TO VERIFY 74.2* 73.9* 72.5* 74.8*  PLT QUESTIONABLE RESULTS, RECOMMEND RECOLLECT TO VERIFY 248 253 283 268   Basic Metabolic Panel: Recent Labs  Lab 12/08/23 1616 12/09/23 0547 12/10/23 0409 12/11/23 0552 12/12/23 0410  NA 138 136 137 135 137  K 5.9* 3.9 4.0 3.9 3.7  CL 105 108 108 102 106  CO2 24 19* 21* 22 21*  GLUCOSE 315* 195* 84 191* 242*  BUN 36* 30* 21 24* 24*  CREATININE 1.49* 1.21* 1.19* 1.14* 1.11*  CALCIUM 9.5 8.9 8.7* 9.4 8.8*  MG  --   --  1.5* 2.1 2.0  PHOS  --   --  3.2 3.3 4.1   GFR: Estimated Creatinine Clearance: 26.5 mL/min (A) (by C-G formula based on SCr of 1.11 mg/dL (H)). Liver Function Tests: Recent Labs  Lab 12/08/23 1616 12/10/23 0409 12/11/23 0552 12/12/23 0410  AST 22 20 19 18   ALT 19 15 16 15   ALKPHOS 94 78 77 69  BILITOT 0.7 0.6 0.7 0.6  PROT 6.9 6.5 7.0 6.4*  ALBUMIN 2.8* 2.6* 2.9* 2.8*   CBG: Recent Labs  Lab 12/11/23 1151 12/11/23 1633 12/11/23 2250 12/12/23 0800 12/12/23 1200  GLUCAP 167* 73 112* 172* 99    Recent Results (from the past 240 hours)  Resp panel by RT-PCR (RSV, Flu A&B, Covid) Anterior Nasal Swab     Status: None   Collection Time: 12/10/23  5:17 PM   Specimen: Anterior Nasal Swab  Result Value Ref Range Status   SARS Coronavirus 2 by RT PCR NEGATIVE NEGATIVE Final    Comment: (NOTE) SARS-CoV-2 target nucleic acids are NOT DETECTED.  The SARS-CoV-2 RNA is generally detectable in upper respiratory specimens during the acute phase of infection. The lowest concentration of  SARS-CoV-2 viral copies this assay can detect is 138 copies/mL. A negative result does not preclude SARS-Cov-2 infection and should not be used as the sole basis for treatment or other patient management decisions. A negative result may occur with  improper specimen collection/handling, submission of specimen other than nasopharyngeal swab, presence of viral mutation(s) within the areas targeted by this assay, and inadequate number of viral copies(<138 copies/mL). A negative result must be combined with clinical observations, patient history, and epidemiological information. The expected result is Negative.  Fact Sheet for Patients:  BloggerCourse.com  Fact Sheet for Healthcare Providers:  SeriousBroker.it  This test is no t yet approved or cleared by the [place] FDA and  has been authorized for detection and/or diagnosis of SARS-CoV-2 by FDA under an Emergency Use Authorization (EUA). This EUA will remain  in effect (meaning this test can be used) for the duration  of the COVID-19 declaration under Section 564(b)(1) of the Act, 21 U.S.C.section 360bbb-3(b)(1), unless the authorization is terminated  or revoked sooner.       Influenza A by PCR NEGATIVE NEGATIVE Final   Influenza B by PCR NEGATIVE NEGATIVE Final    Comment: (NOTE) The Xpert Xpress SARS-CoV-2/FLU/RSV plus assay is intended as an aid in the diagnosis of influenza from Nasopharyngeal swab specimens and should not be used as a sole basis for treatment. Nasal washings and aspirates are unacceptable for Xpert Xpress SARS-CoV-2/FLU/RSV testing.  Fact Sheet for Patients: BloggerCourse.com  Fact Sheet for Healthcare Providers: SeriousBroker.it  This test is not yet approved or cleared by the [place] FDA and has been authorized for detection and/or diagnosis of SARS-CoV-2 by FDA under an Emergency Use  Authorization (EUA). This EUA will remain in effect (meaning this test can be used) for the duration of the COVID-19 declaration under Section 564(b)(1) of the Act, 21 U.S.C. section 360bbb-3(b)(1), unless the authorization is terminated or revoked.     Resp Syncytial Virus by PCR NEGATIVE NEGATIVE Final    Comment: (NOTE) Fact Sheet for Patients: BloggerCourse.com  Fact Sheet for Healthcare Providers: SeriousBroker.it  This test is not yet approved or cleared by the [place] FDA and has been authorized for detection and/or diagnosis of SARS-CoV-2 by FDA under an Emergency Use Authorization (EUA). This EUA will remain in effect (meaning this test can be used) for the duration of the COVID-19 declaration under Section 564(b)(1) of the Act, 21 U.S.C. section 360bbb-3(b)(1), unless the authorization is terminated or revoked.  Performed at [facility], [address] [place], [place] [address]   Culture, blood (Routine X 2) w Reflex to ID Panel     Status: None (Preliminary result)   Collection Time: 12/11/23  1:00 PM   Specimen: BLOOD  Result Value Ref Range Status   Specimen Description BLOOD BLOOD RIGHT ARM  Final   Special Requests   Final    BOTTLES DRAWN AEROBIC AND ANAEROBIC Blood Culture adequate volume   Culture   Final    NO GROWTH < 24 HOURS Performed at [facility], [address], [place], [place] [address]    Report Status PENDING  Incomplete  Culture, blood (Routine X 2) w Reflex to ID Panel     Status: None (Preliminary result)   Collection Time: 12/11/23  1:17 PM   Specimen: BLOOD  Result Value Ref Range Status   Specimen Description BLOOD BLOOD RIGHT HAND  Final   Special Requests   Final    Blood Culture results may not be optimal due to an inadequate volume of blood received in culture bottles   Culture   Final    NO GROWTH < 24 HOURS Performed at [facility], [address], [place], [place] [address]    Report Status PENDING  Incomplete     Radiology Studies: CT HEAD WO CONTRAST ( ) Result Date: 12/10/2023 CLINICAL DATA:  Initial evaluation for acute delirium, history of prior stroke. EXAM: CT HEAD WITHOUT CONTRAST TECHNIQUE: Contiguous axial images were obtained from the base of the skull through the vertex without intravenous contrast. RADIATION DOSE REDUCTION: This exam was performed according to the departmental dose-optimization program which includes automated exposure control, adjustment of the mA and/or kV according to patient size and/or use of iterative reconstruction technique. COMPARISON:  Prior study from 08/23/2022. FINDINGS: Brain: Cerebral volume within normal limits for age. Mild chronic microvascular ischemic disease noted involving the  supratentorial cerebral white matter. Few small remote bilateral cerebellar infarcts noted. No acute intracranial hemorrhage. No acute large vessel territory infarct. No mass lesion or midline shift. Mild ventricular prominence related to global parenchymal volume loss without hydrocephalus. No extra-axial fluid collection. Empty sella noted. Vascular: No abnormal hyperdense vessel. Calcified atherosclerosis present at the skull base. Skull: Scalp soft tissues within normal limits.  Calvarium intact. Sinuses/Orbits: Globes orbital soft tissues within normal limits. Paranasal sinuses are largely clear. No mastoid effusion. Other: None. IMPRESSION: 1. No acute intracranial abnormality. 2. Mild chronic microvascular ischemic disease with a few small remote bilateral cerebellar infarcts. Electronically Signed   By: Rise Mu M.D.   On: 12/10/2023 19:39    Scheduled Meds:  apixaban  2.5 mg Oral BID   cephALEXin  500 mg Oral Q24H   cholecalciferol  1,000 Units Oral Daily   clopidogrel  75 mg Oral Daily   docusate sodium  100 mg Oral BID   feeding supplement  237 mL Oral BID BM   insulin aspart  0-15  Units Subcutaneous TID AC & HS   insulin glargine-yfgn  5 Units Subcutaneous QHS   metoprolol tartrate  25 mg Oral BID   mirabegron ER  25 mg Oral Daily   multivitamin with minerals  1 tablet Oral Daily   pantoprazole  40 mg Oral Daily   rosuvastatin  10 mg Oral QHS   venlafaxine XR  75 mg Oral Q breakfast   Continuous Infusions:     LOS: 3 days    Silvano Bilis, MD [facility]  To contact the attending physician between 7A-7P please use Epic Chat. To contact the covering physician during after hours 7P-7A, please review Amion.   12/12/2023, 12:16 PM

## 2023-12-12 NOTE — Plan of Care (Signed)
  Problem: Education: Goal: Knowledge of General Education information will improve Description: Including pain rating scale, medication(s)/side effects and non-pharmacologic comfort measures Outcome: Not Progressing   Problem: Health Behavior/Discharge Planning: Goal: Ability to manage health-related needs will improve Outcome: Not Progressing   Problem: Clinical Measurements: Goal: Ability to maintain clinical measurements within normal limits will improve Outcome: Not Progressing Goal: Will remain free from infection Outcome: Progressing Goal: Diagnostic test results will improve Outcome: Progressing Goal: Respiratory complications will improve Outcome: Progressing Goal: Cardiovascular complication will be avoided Outcome: Progressing   Problem: Activity: Goal: Risk for activity intolerance will decrease Outcome: Progressing   Problem: Nutrition: Goal: Adequate nutrition will be maintained Outcome: Not Progressing   Problem: Coping: Goal: Level of anxiety will decrease Outcome: Progressing   Problem: Elimination: Goal: Will not experience complications related to bowel motility Outcome: Progressing Goal: Will not experience complications related to urinary retention Outcome: Progressing   Problem: Pain Managment: Goal: General experience of comfort will improve and/or be controlled Outcome: Progressing   Problem: Safety: Goal: Ability to remain free from injury will improve Outcome: Progressing   Problem: Skin Integrity: Goal: Risk for impaired skin integrity will decrease Outcome: Progressing   Problem: Education: Goal: Ability to describe self-care measures that may prevent or decrease complications (Diabetes Survival Skills Education) will improve Outcome: Not Progressing Goal: Individualized Educational Video(s) Outcome: Not Progressing   Problem: Coping: Goal: Ability to adjust to condition or change in health will improve Outcome: Progressing    Problem: Fluid Volume: Goal: Ability to maintain a balanced intake and output will improve Outcome: Progressing   Problem: Health Behavior/Discharge Planning: Goal: Ability to identify and utilize available resources and services will improve Outcome: Progressing Goal: Ability to manage health-related needs will improve Outcome: Progressing   Problem: Metabolic: Goal: Ability to maintain appropriate glucose levels will improve Outcome: Progressing   Problem: Nutritional: Goal: Maintenance of adequate nutrition will improve Outcome: Progressing Goal: Progress toward achieving an optimal weight will improve Outcome: Progressing   Problem: Skin Integrity: Goal: Risk for impaired skin integrity will decrease Outcome: Progressing   Problem: Tissue Perfusion: Goal: Adequacy of tissue perfusion will improve Outcome: Progressing

## 2023-12-12 NOTE — Care Management Important Message (Signed)
 Important Message  Patient Details  Name: Cassandra [name] MRN: [id number] Date of Birth: [birth year]   Important Message Given:  Yes - Medicare IM     Cristela Blue, CMA 12/12/2023, 11:47 AM
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
## 2023-12-13 DIAGNOSIS — N179 Acute kidney failure, unspecified: Secondary | ICD-10-CM | POA: Diagnosis not present

## 2023-12-13 DIAGNOSIS — E785 Hyperlipidemia, unspecified: Secondary | ICD-10-CM | POA: Diagnosis not present

## 2023-12-13 DIAGNOSIS — R5381 Other malaise: Secondary | ICD-10-CM

## 2023-12-13 DIAGNOSIS — R1314 Dysphagia, pharyngoesophageal phase: Secondary | ICD-10-CM | POA: Diagnosis not present

## 2023-12-13 DIAGNOSIS — E875 Hyperkalemia: Secondary | ICD-10-CM | POA: Diagnosis not present

## 2023-12-13 LAB — CBC WITH DIFFERENTIAL/PLATELET
Abs Immature Granulocytes: 0.1 10*3/uL — ABNORMAL HIGH (ref 0.00–0.07)
Basophils Absolute: 0.1 10*3/uL (ref 0.0–0.1)
Basophils Relative: 1 %
Eosinophils Absolute: 0.5 10*3/uL (ref 0.0–0.5)
Eosinophils Relative: 4 %
HCT: 34.2 % — ABNORMAL LOW (ref 36.0–46.0)
Hemoglobin: 10.7 g/dL — ABNORMAL LOW (ref 12.0–15.0)
Immature Granulocytes: 1 %
Lymphocytes Relative: 17 %
Lymphs Abs: 2 10*3/uL (ref 0.7–4.0)
MCH: 23.3 pg — ABNORMAL LOW (ref 26.0–34.0)
MCHC: 31.3 g/dL (ref 30.0–36.0)
MCV: 74.3 fL — ABNORMAL LOW (ref 80.0–100.0)
Monocytes Absolute: 1.4 10*3/uL — ABNORMAL HIGH (ref 0.1–1.0)
Monocytes Relative: 12 %
Neutro Abs: 7.7 10*3/uL (ref 1.7–7.7)
Neutrophils Relative %: 65 %
Platelets: 288 10*3/uL (ref 150–400)
RBC: 4.6 MIL/uL (ref 3.87–5.11)
RDW: 18.1 % — ABNORMAL HIGH (ref 11.5–15.5)
WBC: 11.8 10*3/uL — ABNORMAL HIGH (ref 4.0–10.5)
nRBC: 0 % (ref 0.0–0.2)

## 2023-12-13 LAB — GLUCOSE, CAPILLARY
Glucose-Capillary: 121 mg/dL — ABNORMAL HIGH (ref 70–99)
Glucose-Capillary: 280 mg/dL — ABNORMAL HIGH (ref 70–99)
Glucose-Capillary: 279 mg/dL — ABNORMAL HIGH (ref 70–99)
Glucose-Capillary: 288 mg/dL — ABNORMAL HIGH (ref 70–99)

## 2023-12-13 LAB — URINE CULTURE: Culture: >=100000 — AB

## 2023-12-13 LAB — COMPREHENSIVE METABOLIC PANEL WITH GFR
ALT: 12 U/L (ref 0–44)
AST: 16 U/L (ref 15–41)
Albumin: 2.7 g/dL — ABNORMAL LOW (ref 3.5–5.0)
Alkaline Phosphatase: 64 U/L (ref 38–126)
Anion gap: 9 (ref 5–15)
BUN: 24 mg/dL — ABNORMAL HIGH (ref 8–23)
CO2: 24 mmol/L (ref 22–32)
Calcium: 9 mg/dL (ref 8.9–10.3)
Chloride: 109 mmol/L (ref 98–111)
Creatinine, Ser: 1.02 mg/dL — ABNORMAL HIGH (ref 0.44–1.00)
GFR, Estimated: 53 mL/min — ABNORMAL LOW (ref >60)
Glucose, Bld: 90 mg/dL (ref 70–99)
Potassium: 3.7 mmol/L (ref 3.5–5.1)
Sodium: 142 mmol/L (ref 135–145)
Total Bilirubin: 0.5 mg/dL (ref 0.0–1.2)
Total Protein: 6.4 g/dL — ABNORMAL LOW (ref 6.5–8.1)

## 2023-12-13 LAB — MAGNESIUM: Magnesium: 1.9 mg/dL (ref 1.7–2.4)

## 2023-12-13 LAB — PHOSPHORUS: Phosphorus: 3.7 mg/dL (ref 2.5–4.6)

## 2023-12-13 NOTE — Plan of Care (Signed)

## 2023-12-13 NOTE — Progress Notes (Signed)
 Progress Note   Patient: Cassandra [name] WUJ:[id number] DOB: October 20, 1932 DOA: 12/08/2023     4 DOS: the patient was seen and examined on 12/13/2023   Brief hospital course: KALEISHA BHARGAVA is 89 y.o. female with anxiety, depression, type 2 diabetes, status post left BKA, CAD, COPD, hypothyroidism, OSA, chronic dysphagia with acute worsening.  In the ER she was found to be hyperkalemic, hyperglycemic, creatinine 1.49, BUN 36. Patient was given 1 L NS, Lokelma, D50 insulin.   Patient is admitted to TRH service for further management evaluation of hyperkalemia, dysphagia Barium swallow done 12/09/23 shows presbyesophagus with mod to severe dysomotility, no evidence stricture.  SLP advised to dysphagia 2 diet. She is awaiting insurance authorization for SNF placement.  Assessment and Plan: * Dysphagia Chronic since 2023. GI consulted. Dr. Ole Berkeley advised barium swallow which showed presbyesophagus with mod to severe dysomotility, no evidence stricture. GI signed off  SLP advised dysphagia 3 diet.  Delirium Klebsiella UTI CT imaging unremarkable.  Mental status improved. Continue Keflex for UTI.  Hyperkalemia Resolved s/p Lokelma and insulin. Continue to trend electrolytes.  CKD stage 3 B Kidney function stable. Avoid nephrotoxic drugs.  Dyslipidemia Continue statin therapy.  Hypothyroidism Continue home dose Synthroid.  Type 2 diabetes mellitus with peripheral neuropathy (HCC) Continue Accu-Cheks, sliding scale insulin. Continue Semglee 5 units at bedtime. Hold metformin. Continue Neurontin.  Paroxysmal atrial fibrillation (HCC) Continue Eliquis.  Debility: Evaluated by PT advised skilled nursing facility. Patient is awaiting insurance authorization.      Out of bed to chair. Incentive spirometry. Nursing supportive care. Fall, aspiration precautions. Diet:  Diet Orders (From admission, onward)     Start     Ordered   12/11/23 1643  DIET DYS 3 Room service  appropriate? Yes with Assist; Fluid consistency: Thin  Diet effective now       Comments: Please MINCE meats and add extra Gravies. May have baked and sweet potatoes per Speech ok. Soups at lunch/dinner.   PLEASE SEND YOGURT TID MEALS!  Question Answer Comment  Room service appropriate? Yes with Assist   Fluid consistency: Thin      12/11/23 1643           DVT prophylaxis: apixaban (ELIQUIS) tablet 2.5 mg Start: 12/08/23 2200 apixaban (ELIQUIS) tablet 2.5 mg  Level of care: Med-Surg   Code Status: Full Code  Subjective: Patient is seen and examined today morning. She is alert, awake, able to answer me. Denies pain. Did not get out of bed. Eating fair.  Physical Exam: Vitals:   12/13/23 0045 12/13/23 0437 12/13/23 0441 12/13/23 0804  BP: (!) 99/51  (!) 148/57 (!) 118/45  Pulse: 61 69 63 62  Resp: 18 18  20   Temp: (!) 97.4 F (36.3 C) (!) 97 F (36.1 C)  97.7 F (36.5 C)  TempSrc:  Oral  Oral  SpO2: 97% 95%  92%  Weight:      Height:        General - Elderly Caucasian female, no apparent distress HEENT - PERRLA, EOMI, atraumatic head, non tender sinuses. Lung - Clear, basal rales, rhonchi, wheezes. Heart - S1, S2 heard, no murmurs, rubs, trace pedal edema. Abdomen - Soft, non tender, bowel sounds good Neuro - Alert, awake and oriented, non focal exam. Skin - Warm and dry.  Data Reviewed:      Latest Ref Rng & Units 12/13/2023    4:30 AM 12/12/2023    4:10 AM 12/11/2023    5:52 AM  CBC  WBC 4.0 - 10.5 K/uL 11.8  10.0  10.4   Hemoglobin 12.0 - 15.0 g/dL 16.1  09.6  04.5   Hematocrit 36.0 - 46.0 % 34.2  34.2  34.2   Platelets 150 - 400 K/uL 288  268  283       Latest Ref Rng & Units 12/13/2023    4:30 AM 12/12/2023    4:10 AM 12/11/2023    5:52 AM  BMP  Glucose 70 - 99 mg/dL 90  409  811   BUN 8 - 23 mg/dL 24  24  24    Creatinine 0.44 - 1.00 mg/dL 9.14  7.82  9.56   Sodium 135 - 145 mmol/L 142  137  135   Potassium 3.5 - 5.1 mmol/L 3.7  3.7  3.9   Chloride  98 - 111 mmol/L 109  106  102   CO2 22 - 32 mmol/L 24  21  22    Calcium 8.9 - 10.3 mg/dL 9.0  8.8  9.4    No results found.  Family Communication: Discussed with patient, she understand and agree. All questions answered.  Disposition: Status is: Inpatient Remains inpatient appropriate because: safe dc plan  Planned Discharge Destination: Skilled nursing facility pending insurance auth.     Time spent: 38 minutes  Author: Aisha Hove, MD 12/13/2023 2:13 PM Secure chat 7am to 7pm For on call review [email or web address].

## 2023-12-14 DIAGNOSIS — R1314 Dysphagia, pharyngoesophageal phase: Secondary | ICD-10-CM

## 2023-12-14 LAB — CBC WITH DIFFERENTIAL/PLATELET
Abs Immature Granulocytes: 0.07 10*3/uL (ref 0.00–0.07)
Basophils Absolute: 0.1 10*3/uL (ref 0.0–0.1)
Basophils Relative: 1 %
Eosinophils Absolute: 0.7 10*3/uL — ABNORMAL HIGH (ref 0.0–0.5)
Eosinophils Relative: 6 %
HCT: 33.4 % — ABNORMAL LOW (ref 36.0–46.0)
Hemoglobin: 10.2 g/dL — ABNORMAL LOW (ref 12.0–15.0)
Immature Granulocytes: 1 %
Lymphocytes Relative: 23 %
Lymphs Abs: 2.6 10*3/uL (ref 0.7–4.0)
MCH: 22.9 pg — ABNORMAL LOW (ref 26.0–34.0)
MCHC: 30.5 g/dL (ref 30.0–36.0)
MCV: 75.1 fL — ABNORMAL LOW (ref 80.0–100.0)
Monocytes Absolute: 1.1 10*3/uL — ABNORMAL HIGH (ref 0.1–1.0)
Monocytes Relative: 9 %
Neutro Abs: 7.1 10*3/uL (ref 1.7–7.7)
Neutrophils Relative %: 60 %
Platelets: 322 10*3/uL (ref 150–400)
RBC: 4.45 MIL/uL (ref 3.87–5.11)
RDW: 18.2 % — ABNORMAL HIGH (ref 11.5–15.5)
WBC: 11.7 10*3/uL — ABNORMAL HIGH (ref 4.0–10.5)
nRBC: 0 % (ref 0.0–0.2)

## 2023-12-14 LAB — COMPREHENSIVE METABOLIC PANEL WITH GFR
ALT: 11 U/L (ref 0–44)
AST: 15 U/L (ref 15–41)
Albumin: 2.6 g/dL — ABNORMAL LOW (ref 3.5–5.0)
Alkaline Phosphatase: 63 U/L (ref 38–126)
Anion gap: 6 (ref 5–15)
BUN: 27 mg/dL — ABNORMAL HIGH (ref 8–23)
CO2: 26 mmol/L (ref 22–32)
Calcium: 8.7 mg/dL — ABNORMAL LOW (ref 8.9–10.3)
Chloride: 105 mmol/L (ref 98–111)
Creatinine, Ser: 1.25 mg/dL — ABNORMAL HIGH (ref 0.44–1.00)
GFR, Estimated: 41 mL/min — ABNORMAL LOW (ref >60)
Glucose, Bld: 253 mg/dL — ABNORMAL HIGH (ref 70–99)
Potassium: 4.3 mmol/L (ref 3.5–5.1)
Sodium: 137 mmol/L (ref 135–145)
Total Bilirubin: 0.4 mg/dL (ref 0.0–1.2)
Total Protein: 6.6 g/dL (ref 6.5–8.1)

## 2023-12-14 LAB — MAGNESIUM: Magnesium: 1.8 mg/dL (ref 1.7–2.4)

## 2023-12-14 LAB — GLUCOSE, CAPILLARY
Glucose-Capillary: 206 mg/dL — ABNORMAL HIGH (ref 70–99)
Glucose-Capillary: 309 mg/dL — ABNORMAL HIGH (ref 70–99)
Glucose-Capillary: 107 mg/dL — ABNORMAL HIGH (ref 70–99)
Glucose-Capillary: 208 mg/dL — ABNORMAL HIGH (ref 70–99)

## 2023-12-14 LAB — PHOSPHORUS: Phosphorus: 3.2 mg/dL (ref 2.5–4.6)

## 2023-12-14 MED ORDER — INSULIN GLARGINE-YFGN 100 UNIT/ML ~~LOC~~ SOLN
10.0000 [IU] | Freq: Every day | SUBCUTANEOUS | Status: DC
  Administered 2023-12-14: 10 [IU] via SUBCUTANEOUS
  Filled 2023-12-14 (×2): qty 0.1

## 2023-12-14 NOTE — Progress Notes (Addendum)
 PROGRESS NOTE    Cassandra [name]  VOZ:[id number] DOB: November 14, 1932 DOA: 12/08/2023 PCP: Yehuda Helms, MD    Hospital Course:  Cassandra [name] is 89 y.o. female with anxiety, depression, type 2 diabetes, status post left BKA, CAD, COPD, hypothyroidism, OSA, chronic dysphagia with acute worsening.  Patient has been following outpatient with gastroenterology and has had prior barium swallows are unremarkable.  She had planned for repeat barium swallow but has not had this done yet. Patient also endorses low urinary output but dry cough and choking sensation. In the ER she was found to be hyperkalemic, hyperglycemic, creatinine 1.49, BUN 36.  Patient was given 1 L NS, Lokelma, D50 insulin.  Subjective: No complaints   Objective: Vitals:   12/13/23 1654 12/13/23 2043 12/14/23 0402 12/14/23 0733  BP: 139/84 121/78 (!) 112/57 (!) 145/57  Pulse: 70 60 65 60  Resp: 18 16 16 16   Temp: 98 F (36.7 C) (!) 97.5 F (36.4 C) (!) 97.5 F (36.4 C) 98 F (36.7 C)  TempSrc:  Oral Oral Oral  SpO2: 90% 96% 100% 100%  Weight:      Height:        Intake/Output Summary (Last 24 hours) at 12/14/2023 1447 Last data filed at 12/14/2023 1425 Gross per 24 hour  Intake 260 ml  Output --  Net 260 ml    Filed Weights   12/09/23 1234  Weight: 57.2 kg    Examination: General exam: Appears calm and comfortable, NAD Respiratory system: No work of breathing, symmetric chest wall expansion, ctab Cardiovascular system: S1 & S2 heard, RRR.  Gastrointestinal system: Abdomen is nondistended, soft and nontender.  Neuro: Alert and oriented to self and place, moving all 4 Extremities: Symmetric, expected ROM. S/p left aka Skin: No rashes, lesions Psychiatry: calm, mild confusion  Assessment & Plan:  Principal Problem:   Dysphagia Active Problems:   Hyperkalemia   Acute kidney injury superimposed on chronic kidney disease (HCC)   AKI (acute kidney injury) (HCC)   Paroxysmal atrial fibrillation  (HCC)   Type 2 diabetes mellitus with peripheral neuropathy (HCC)   Hypothyroidism   Dyslipidemia    Dysphagia - This is a chronic problem since at least 2023 - Has been following with gastroenterology, Dr. Antony Baumgartner outpatient - dr. Ole Berkeley consulted here, barium swallow yesterday shows presbyesophagus with mod to severe dysomotility, no evidence stricture. Nothing for gi to do - slp evaluated and cleared for current dysphagia 3 diet  Delirium UTI? No uremia, doesn't appear infected, no electrolyte disturbances, bicarb not elevated, doesn't appear at high risk for wernicke. B12 wnl. Husband says was like this last week, resolved, now 4 days of this. Ct 2023 showed subacute stroke. Hypothyroidism is overtreated as below. Ua from 4/8 does show bacteriuria, this could be uti. Am cortisol relatively normal, vbg unremarkable. Respiratory panel neg. Ct head nothing acute (has pacemaker, can't get mri). Delirium is improving following treatment for uti, culture growing klebsiella - continue keflex - hold prn tramadol  Hyperkalemia - Resolved, status post Lokelma and insulin  CKD stage IIIb stable  Paroxysmal atrial fibrillation - Continue on Eliquis.  T2DM Hyperglycemic - increase semglee from 5 to 10 - SSI  Left BKA - stable.   OSA - Does not use CPAP at home  Combined systolic and diastolic heart failure Appears euvolemic   Hypothyroid Tsh is low and t4 elevated - hold home levo, would reduce dose at discharge  Debility PT advising SNF and husband agreeable, has snf bed,  pending insurance auth    DVT prophylaxis: Eliquis   Code Status: Full Code Family Communication:  husband updated @ bedside 4/11, daughter telephonically 4/13 Disposition: snf pending insurance auth  Consultants: gi (signed off)   Procedures:  esophogram  Antimicrobials:  Anti-infectives (From admission, onward)    Start     Dose/Rate Route Frequency Ordered Stop   12/11/23 2200  cephALEXin  (KEFLEX) capsule 500 mg        500 mg Oral Every 24 hours 12/11/23 1740         Data Reviewed: I have personally reviewed following labs and imaging studies CBC: Recent Labs  Lab 12/10/23 0409 12/11/23 0552 12/12/23 0410 12/13/23 0430 12/14/23 0546  WBC 10.7* 10.4 10.0 11.8* 11.7*  NEUTROABS 6.5 7.1 6.3 7.7 7.1  HGB 10.1* 10.8* 10.6* 10.7* 10.2*  HCT 32.2* 34.2* 34.2* 34.2* 33.4*  MCV 73.9* 72.5* 74.8* 74.3* 75.1*  PLT 253 283 268 288 322   Basic Metabolic Panel: Recent Labs  Lab 12/10/23 0409 12/11/23 0552 12/12/23 0410 12/13/23 0430 12/14/23 0546  NA 137 135 137 142 137  K 4.0 3.9 3.7 3.7 4.3  CL 108 102 106 109 105  CO2 21* 22 21* 24 26  GLUCOSE 84 191* 242* 90 253*  BUN 21 24* 24* 24* 27*  CREATININE 1.19* 1.14* 1.11* 1.02* 1.25*  CALCIUM 8.7* 9.4 8.8* 9.0 8.7*  MG 1.5* 2.1 2.0 1.9 1.8  PHOS 3.2 3.3 4.1 3.7 3.2   GFR: Estimated Creatinine Clearance: 23.5 mL/min (A) (by C-G formula based on SCr of 1.25 mg/dL (H)). Liver Function Tests: Recent Labs  Lab 12/10/23 0409 12/11/23 0552 12/12/23 0410 12/13/23 0430 12/14/23 0546  AST 20 19 18 16 15   ALT 15 16 15 12 11   ALKPHOS 78 77 69 64 63  BILITOT 0.6 0.7 0.6 0.5 0.4  PROT 6.5 7.0 6.4* 6.4* 6.6  ALBUMIN 2.6* 2.9* 2.8* 2.7* 2.6*   CBG: Recent Labs  Lab 12/13/23 1142 12/13/23 1654 12/13/23 2202 12/14/23 0813 12/14/23 1140  GLUCAP 280* 279* 288* 206* 309*    Recent Results (from the past 240 hours)  Resp panel by RT-PCR (RSV, Flu A&B, Covid) Anterior Nasal Swab     Status: None   Collection Time: 12/10/23  5:17 PM   Specimen: Anterior Nasal Swab  Result Value Ref Range Status   SARS Coronavirus 2 by RT PCR NEGATIVE NEGATIVE Final    Comment: (NOTE) SARS-CoV-2 target nucleic acids are NOT DETECTED.  The SARS-CoV-2 RNA is generally detectable in upper respiratory specimens during the acute phase of infection. The lowest concentration of SARS-CoV-2 viral copies this assay can detect is 138  copies/mL. A negative result does not preclude SARS-Cov-2 infection and should not be used as the sole basis for treatment or other patient management decisions. A negative result may occur with  improper specimen collection/handling, submission of specimen other than nasopharyngeal swab, presence of viral mutation(s) within the areas targeted by this assay, and inadequate number of viral copies(<138 copies/mL). A negative result must be combined with clinical observations, patient history, and epidemiological information. The expected result is Negative.  Fact Sheet for Patients:  BloggerCourse.com  Fact Sheet for Healthcare Providers:  SeriousBroker.it  This test is no t yet approved or cleared by the [place]  FDA and  has been authorized for detection and/or diagnosis of SARS-CoV-2 by FDA under an Emergency Use Authorization (EUA). This EUA will remain  in effect (meaning this test can be used) for the duration  of the COVID-19 declaration under Section 564(b)(1) of the Act, 21 U.S.C.section 360bbb-3(b)(1), unless the authorization is terminated  or revoked sooner.       Influenza A by PCR NEGATIVE NEGATIVE Final   Influenza B by PCR NEGATIVE NEGATIVE Final    Comment: (NOTE) The Xpert Xpress SARS-CoV-2/FLU/RSV plus assay is intended as an aid in the diagnosis of influenza from Nasopharyngeal swab specimens and should not be used as a sole basis for treatment. Nasal washings and aspirates are unacceptable for Xpert Xpress SARS-CoV-2/FLU/RSV testing.  Fact Sheet for Patients: BloggerCourse.com  Fact Sheet for Healthcare Providers: SeriousBroker.it  This test is not yet approved or cleared by the [place]  FDA and has been authorized for detection and/or diagnosis of SARS-CoV-2 by FDA under an Emergency Use Authorization (EUA). This EUA will remain in effect (meaning  this test can be used) for the duration of the COVID-19 declaration under Section 564(b)(1) of the Act, 21 U.S.C. section 360bbb-3(b)(1), unless the authorization is terminated or revoked.     Resp Syncytial Virus by PCR NEGATIVE NEGATIVE Final    Comment: (NOTE) Fact Sheet for Patients: BloggerCourse.com  Fact Sheet for Healthcare Providers: SeriousBroker.it  This test is not yet approved or cleared by the [place]  FDA and has been authorized for detection and/or diagnosis of SARS-CoV-2 by FDA under an Emergency Use Authorization (EUA). This EUA will remain in effect (meaning this test can be used) for the duration of the COVID-19 declaration under Section 564(b)(1) of the Act, 21 U.S.C. section 360bbb-3(b)(1), unless the authorization is terminated or revoked.  Performed at [facility], [address] [place], [place] [address]   Culture, blood (Routine X 2) w Reflex to ID Panel     Status: None (Preliminary result)   Collection Time: 12/11/23  1:00 PM   Specimen: BLOOD  Result Value Ref Range Status   Specimen Description BLOOD BLOOD RIGHT ARM  Final   Special Requests   Final    BOTTLES DRAWN AEROBIC AND ANAEROBIC Blood Culture adequate volume   Culture   Final    NO GROWTH 3 DAYS Performed at [facility], [address], [place], [place] [address]    Report Status PENDING  Incomplete  Culture, blood (Routine X 2) w Reflex to ID Panel     Status: None (Preliminary result)   Collection Time: 12/11/23  1:17 PM   Specimen: BLOOD  Result Value Ref Range Status   Specimen Description BLOOD BLOOD RIGHT HAND  Final   Special Requests   Final    Blood Culture results may not be optimal due to an inadequate volume of blood received in culture bottles   Culture   Final    NO GROWTH 3 DAYS Performed at [facility], [address], [place], [place] [address]    Report Status PENDING   Incomplete  Urine Culture (for pregnant, neutropenic or urologic patients or patients with an indwelling urinary catheter)     Status: Abnormal   Collection Time: 12/11/23  4:50 PM   Specimen: Urine, Clean Catch  Result Value Ref Range Status   Specimen Description   Final    URINE, CLEAN CATCH Performed at [facility], [address], [place], [place] [address]    Special Requests   Final    NONE Performed at [facility], [address], [place], [place] [address]    Culture >=100,000 COLONIES/mL KLEBSIELLA PNEUMONIAE (A)  Final   Report Status 12/13/2023 FINAL  Final   Organism ID, Bacteria KLEBSIELLA PNEUMONIAE (A)  Final      Susceptibility   Klebsiella pneumoniae - MIC*    AMPICILLIN >=32 RESISTANT Resistant     CEFAZOLIN <=4 SENSITIVE Sensitive     CEFEPIME <=0.12 SENSITIVE Sensitive     CEFTRIAXONE <=0.25 SENSITIVE Sensitive     CIPROFLOXACIN <=0.25 SENSITIVE Sensitive     GENTAMICIN <=1 SENSITIVE Sensitive     IMIPENEM <=0.25 SENSITIVE Sensitive     NITROFURANTOIN 64 INTERMEDIATE Intermediate     TRIMETH/SULFA <=20 SENSITIVE Sensitive     AMPICILLIN/SULBACTAM 8 SENSITIVE Sensitive     PIP/TAZO <=4 SENSITIVE Sensitive ug/mL    * >=100,000 COLONIES/mL KLEBSIELLA PNEUMONIAE     Radiology Studies: No results found.   Scheduled Meds:  apixaban  2.5 mg Oral BID   cephALEXin  500 mg Oral Q24H   cholecalciferol  1,000 Units Oral Daily   clopidogrel  75 mg Oral Daily   docusate sodium  100 mg Oral BID   feeding supplement  237 mL Oral BID BM   insulin aspart  0-15 Units Subcutaneous TID AC & HS   insulin glargine-yfgn  5 Units Subcutaneous QHS   metoprolol tartrate  25 mg Oral BID   mirabegron ER  25 mg Oral Daily   multivitamin with minerals  1 tablet Oral Daily   pantoprazole  40 mg Oral Daily   rosuvastatin  10 mg Oral QHS   venlafaxine XR  75 mg Oral Q breakfast   Continuous Infusions:     LOS: 5 days    Raymonde Calico, MD [facility]  To contact the attending physician between 7A-7P please use Epic Chat. To contact the covering physician during after hours 7P-7A, please review Amion.   12/14/2023, 2:47 PM

## 2023-12-14 NOTE — Progress Notes (Signed)
 SLP F/u Note  Patient Details Name: Cassandra [name] MRN: [id number] DOB: [birth year]   Cancelled treatment:       Reason Eval/Treat Not Completed:  (chart reviewed. Met w/ pt.)   Cancelled treatment:       Reason Eval/Treat Not Completed:  (chart reviewed; talked w/ pt)  Per chart review, "Per OutPatient chart notes from her MDs: "pt has CHRONIC Gastroesophageal Reflux Disease (GERD) and LPRD". Many things have been discussed and Education given per OutPatient visits to MDs including:  "Chronic acid reflux exacerbated by wine consumption, causing nighttime reflux and coughing. Acid-reducing medication to improve symptoms and allow wine consumption without suffering. Prescribe acid-reducing medication.".  Pt's DG Esophagus this hospitalization on 12/09/2023 indicated: "Presbyesophagus. Moderate to severe dysmotility. 2. Moderate to severe dysmotility with retropulsion and notable tertiary contractions. 3. Unable to assess for reflux due to low gastric contrast volume. 4. Notable delay in passing of barium tablet. Swallowing: Appears Normal. No vestibular penetration nor aspiration seen. Pharynx: Unremarkable.".   Will f/u w/ pt tomorrow at bedside w/ a meal/po's for assessment and further education, needs. Pt agreed. NSG updated.    Late Entry: Darla Edward, MS, CCC-SLP Speech Language Pathologist Rehab Services; [facility] [place] [phone] (ascom) [name] 12/14/2023, 11:03 AM

## 2023-12-14 NOTE — Plan of Care (Signed)

## 2023-12-15 DIAGNOSIS — N3000 Acute cystitis without hematuria: Secondary | ICD-10-CM | POA: Insufficient documentation

## 2023-12-15 DIAGNOSIS — R1310 Dysphagia, unspecified: Secondary | ICD-10-CM | POA: Diagnosis not present

## 2023-12-15 DIAGNOSIS — R410 Disorientation, unspecified: Secondary | ICD-10-CM | POA: Insufficient documentation

## 2023-12-15 LAB — GLUCOSE, CAPILLARY
Glucose-Capillary: 190 mg/dL — ABNORMAL HIGH (ref 70–99)
Glucose-Capillary: 398 mg/dL — ABNORMAL HIGH (ref 70–99)

## 2023-12-15 MED ORDER — CEPHALEXIN 500 MG PO CAPS: 500.0000 mg | ORAL_CAPSULE | Freq: Every day | ORAL | Status: AC

## 2023-12-15 MED ORDER — LEVOTHYROXINE SODIUM 88 MCG PO TABS: 88.0000 ug | ORAL_TABLET | Freq: Every day | ORAL | Status: DC

## 2023-12-15 NOTE — Discharge Summary (Addendum)
 Cassandra [name] ZOX:[id number] DOB: 12-16-1932 DOA: 12/08/2023  PCP: [name], MD  Admit date: 12/08/2023 Discharge date: 12/15/2023  Time spent: 35 minutes  Recommendations for Outpatient Follow-up:  Pcp f/u  Tsh 4-6 weeks (levothyroxine dose decreased from 112 to 88)    Discharge Diagnoses:  Principal Problem:   Dysphagia Active Problems:   Hyperkalemia   Acute kidney injury superimposed on chronic kidney disease (HCC)   AKI (acute kidney injury) (HCC)   Paroxysmal atrial fibrillation (HCC)   Type 2 diabetes mellitus with peripheral neuropathy (HCC)   Hypothyroidism   Dyslipidemia   Acute cystitis   Delirium   Discharge Condition: improved  Diet recommendation: carb modified heart healthy dysphagia 3  Filed Weights   12/09/23 1234  Weight: 57.2 kg    History of present illness:  From admission h and p Cassandra [name] is a 89 y.o. Caucasian female with medical history significant for anxiety, depression, type 2 diabetes mellitus, s/p left BKA, coronary artery disease, COPD, hypothyroidism, OSA, who presented to the emergency room with acute onset of dysphagia that has been getting worse lately.  She has been following with Dr. Tobi Bastos for it.  She has not been urinating over the last 8 hours and since she has been in the ER.  No dysuria or hematuria or flank pain.  No fever or chills.  She has been having dry cough with choking without wheezing or dyspnea.  No chest pain or palpitations.  No bleeding diathesis.   Hospital Course:   Dysphagia - This is a chronic problem since at least 2023 - Has been following with gastroenterology, Dr. Tobi Bastos outpatient - dr. Servando Snare consulted here, barium swallow shows presbyesophagus with mod to severe dysomotility, no evidence stricture. Nothing for gi to do - slp evaluated and cleared for current dysphagia 3 diet   Delirium UTI? No uremia, doesn't appear infected, no electrolyte disturbances, bicarb not elevated, doesn't  appear at high risk for wernicke. B12 wnl. Husband says was like this last week, resolved, now 4 days of this. Ct 2023 showed subacute stroke. Hypothyroidism is overtreated as below. Ua from 4/8 does show bacteriuria, this could be uti. Am cortisol relatively normal, vbg unremarkable. Respiratory panel neg. Ct head nothing acute (has pacemaker, can't get mri). Delirium resolved with initiation of keflex, so think this was most likely symptom of uti (klebsiella) - continue keflex    Hyperkalemia - Resolved, status post Lokelma and insulin, stable off those meds   CKD stage IIIb stable   Paroxysmal atrial fibrillation - Continue on Eliquis.   Left BKA - stable.    OSA - Does not use CPAP at home   Combined systolic and diastolic heart failure Appears euvolemic   Hypothyroid Tsh is low and t4 elevated - hold home levo, will reduce dose from 112 to 88, needs repeat TFTs in 4-6 wks   [facility] PT advising SNF and husband agreeable, discharged there    Procedures: esophogram   Consultations: GI  Discharge Exam: Vitals:   12/15/23 0522 12/15/23 0746  BP: (!) 160/64 (!) 169/58  Pulse: 63 (!) 59  Resp: 18 17  Temp: 97.8 F (36.6 C) 97.6 F (36.4 C)  SpO2: 94% 92%    General: NAD Cardiovascular: RRR Respiratory: CTAB  Discharge Instructions   Discharge Instructions     Diet - low sodium heart healthy   Complete by: As directed    Increase activity slowly   Complete by: As directed  Allergies as of 12/15/2023       Reactions   Baclofen Other (See Comments)   Hallucinations   Levofloxacin Other (See Comments)   Hallucinations         Medication List     STOP taking these medications    desvenlafaxine 50 MG 24 hr tablet Commonly known as: PRISTIQ   traMADol 50 MG tablet Commonly known as: ULTRAM       TAKE these medications    acetaminophen 325 MG tablet Commonly known as: TYLENOL Take 1-2 tablets (325-650 mg total) by mouth every 6  (six) hours as needed for mild pain (pain score 1-3 or temp > 100.5).   amitriptyline 10 MG tablet Commonly known as: ELAVIL Take 1 tablet by mouth at bedtime.   apixaban 2.5 MG Tabs tablet Commonly known as: ELIQUIS Take 1 tablet (2.5 mg total) by mouth 2 (two) times daily.   cephALEXin 500 MG capsule Commonly known as: KEFLEX Take 1 capsule (500 mg total) by mouth at bedtime for 3 days.   clopidogrel 75 MG tablet Commonly known as: PLAVIX Take 1 tablet (75 mg total) by mouth daily.   docusate sodium 100 MG capsule Commonly known as: COLACE Take 1 capsule (100 mg total) by mouth 2 (two) times daily.   gabapentin 100 MG capsule Commonly known as: NEURONTIN Take 1 capsule (100 mg total) by mouth 2 (two) times daily as needed (severe phantom limb pain). What changed: when to take this   gabapentin 100 MG capsule Commonly known as: NEURONTIN Take 1 capsule by mouth 3 (three) times daily. What changed: Another medication with the same name was changed. Make sure you understand how and when to take each.   Gemtesa 75 MG Tabs Generic drug: Vibegron Take 1 tablet (75 mg total) by mouth daily.   hydrOXYzine 10 MG tablet Commonly known as: ATARAX Take 1 tablet (10 mg total) by mouth 3 (three) times daily as needed.   levothyroxine 112 MCG tablet Commonly known as: SYNTHROID Take by mouth.   Melatonin 10 MG Caps Take 10 mg by mouth at bedtime as needed (sleep).   metFORMIN 500 MG tablet Commonly known as: GLUCOPHAGE Take 1 tablet (500 mg total) by mouth 2 (two) times daily with a meal.   methocarbamol 500 MG tablet Commonly known as: ROBAXIN Take 1 tablet (500 mg total) by mouth every 6 (six) hours as needed for muscle spasms.   metoprolol tartrate 25 MG tablet Commonly known as: LOPRESSOR Take 1 tablet (25 mg total) by mouth 2 (two) times daily.   multivitamin with minerals Tabs tablet Take 1 tablet by mouth daily.   nitroGLYCERIN 0.4 MG SL tablet Commonly known  as: NITROSTAT Place 1 tablet (0.4 mg total) under the tongue every 5 (five) minutes as needed for chest pain.   pantoprazole 40 MG tablet Commonly known as: PROTONIX Take 40 mg by mouth daily.   pregabalin 25 MG capsule Commonly known as: LYRICA Take 1 capsule (25 mg total) by mouth at bedtime.   rosuvastatin 10 MG tablet Commonly known as: CRESTOR Take 10 mg by mouth at bedtime.   telmisartan 40 MG tablet Commonly known as: MICARDIS Take 40 mg by mouth daily.   torsemide 20 MG tablet Commonly known as: DEMADEX Take 1 tablet (20 mg total) by mouth daily.   Vitamin D (Ergocalciferol) 1.25 MG (50000 UNIT) Caps capsule Commonly known as: DRISDOL Take 1 capsule (50,000 Units total) by mouth every 7 (seven) days. Once a week  on Thursdays   VITAMIN D PO Take 1 capsule by mouth daily.       Allergies  Allergen Reactions   Baclofen Other (See Comments)    Hallucinations   Levofloxacin Other (See Comments)    Hallucinations     Follow-up Information     [name], MD Follow up.   Specialty: Internal Medicine Why: Hospital follow up Contact information: [address] [facility] [place] [address] [phone]                  The results of significant diagnostics from this hospitalization (including imaging, microbiology, ancillary and laboratory) are listed below for reference.    Significant Diagnostic Studies: CT HEAD WO CONTRAST ( ) Result Date: 12/10/2023 CLINICAL DATA:  Initial evaluation for acute delirium, history of prior stroke. EXAM: CT HEAD WITHOUT CONTRAST TECHNIQUE: Contiguous axial images were obtained from the base of the skull through the vertex without intravenous contrast. RADIATION DOSE REDUCTION: This exam was performed according to the departmental dose-optimization program which includes automated exposure control, adjustment of the mA and/or kV according to patient size and/or use of iterative reconstruction  technique. COMPARISON:  Prior study from 08/23/2022. FINDINGS: Brain: Cerebral volume within normal limits for age. Mild chronic microvascular ischemic disease noted involving the supratentorial cerebral white matter. Few small remote bilateral cerebellar infarcts noted. No acute intracranial hemorrhage. No acute large vessel territory infarct. No mass lesion or midline shift. Mild ventricular prominence related to global parenchymal volume loss without hydrocephalus. No extra-axial fluid collection. Empty sella noted. Vascular: No abnormal hyperdense vessel. Calcified atherosclerosis present at the skull base. Skull: Scalp soft tissues within normal limits.  Calvarium intact. Sinuses/Orbits: Globes orbital soft tissues within normal limits. Paranasal sinuses are largely clear. No mastoid effusion. Other: None. IMPRESSION: 1. No acute intracranial abnormality. 2. Mild chronic microvascular ischemic disease with a few small remote bilateral cerebellar infarcts. Electronically Signed   By: Rise Mu M.D.   On: 12/10/2023 19:39   DG ESOPHAGUS W SINGLE CM (SOL OR THIN BA) Result Date: 12/09/2023 CLINICAL DATA:  89 year old female with difficulty swallowing, and reflux with vomiting. EXAM: ESOPHAGUS/BARIUM SWALLOW/TABLET STUDY TECHNIQUE: Single contrast examination was performed using thin liquid barium. This exam was performed by Buzzy Han, PA-C, and was supervised and interpreted by Dr. Roque Lias, MD. FLUOROSCOPY: Radiation Exposure Index (as provided by the fluoroscopic device): 27.20 mGy Kerma COMPARISON:  DG swallow function [employer] speech pathology on 08/16/2022 DG esophagus with double contrast media on 11/14/2021 FINDINGS: Swallowing: Appears normal. No vestibular penetration or aspiration seen. Pharynx: Unremarkable. Esophagus: Presbyesophagus Esophageal motility: Moderate to severe esophageal dysmotility with retropulsion and tertiary contractions Hiatal Hernia: None. Gastroesophageal  reflux: Unable to assess due to low contrast volume in stomach. Ingested 13mm barium tablet: Became stuck Other: Study severely limited by patient's inability to stand, repositioned nor swallow thin liquids. Patient tilted at less than 45 degrees. IMPRESSION: 1.  Presbyesophagus. Moderate to severe dysmotility. 2. Moderate to severe dysmotility with retropulsion and notable tertiary contractions. 3.  Unable to assess for reflux due to low gastric contrast volume. 4. Notable delay in passing of barium tablet, greater than 30 seconds. No evidence of stricture. Procedure performed by Buzzy Han, PA-C Electronically Signed   By: Lupita Raider M.D.   On: 12/09/2023 14:13    Microbiology: Recent Results (from the past 240 hours)  Resp panel by RT-PCR (RSV, Flu A&B, Covid) Anterior Nasal Swab     Status: None  Collection Time: 12/10/23  5:17 PM   Specimen: Anterior Nasal Swab  Result Value Ref Range Status   SARS Coronavirus 2 by RT PCR NEGATIVE NEGATIVE Final    Comment: (NOTE) SARS-CoV-2 target nucleic acids are NOT DETECTED.  The SARS-CoV-2 RNA is generally detectable in upper respiratory specimens during the acute phase of infection. The lowest concentration of SARS-CoV-2 viral copies this assay can detect is 138 copies/mL. A negative result does not preclude SARS-Cov-2 infection and should not be used as the sole basis for treatment or other patient management decisions. A negative result may occur with  improper specimen collection/handling, submission of specimen other than nasopharyngeal swab, presence of viral mutation(s) within the areas targeted by this assay, and inadequate number of viral copies(<138 copies/mL). A negative result must be combined with clinical observations, patient history, and epidemiological information. The expected result is Negative.  Fact Sheet for Patients:  BloggerCourse.com  Fact Sheet for Healthcare Providers:   SeriousBroker.it  This test is no t yet approved or cleared by the [place] FDA and  has been authorized for detection and/or diagnosis of SARS-CoV-2 by FDA under an Emergency Use Authorization (EUA). This EUA will remain  in effect (meaning this test can be used) for the duration of the COVID-19 declaration under Section 564(b)(1) of the Act, 21 U.S.C.section 360bbb-3(b)(1), unless the authorization is terminated  or revoked sooner.       Influenza A by PCR NEGATIVE NEGATIVE Final   Influenza B by PCR NEGATIVE NEGATIVE Final    Comment: (NOTE) The Xpert Xpress SARS-CoV-2/FLU/RSV plus assay is intended as an aid in the diagnosis of influenza from Nasopharyngeal swab specimens and should not be used as a sole basis for treatment. Nasal washings and aspirates are unacceptable for Xpert Xpress SARS-CoV-2/FLU/RSV testing.  Fact Sheet for Patients: BloggerCourse.com  Fact Sheet for Healthcare Providers: SeriousBroker.it  This test is not yet approved or cleared by the [place] FDA and has been authorized for detection and/or diagnosis of SARS-CoV-2 by FDA under an Emergency Use Authorization (EUA). This EUA will remain in effect (meaning this test can be used) for the duration of the COVID-19 declaration under Section 564(b)(1) of the Act, 21 U.S.C. section 360bbb-3(b)(1), unless the authorization is terminated or revoked.     Resp Syncytial Virus by PCR NEGATIVE NEGATIVE Final    Comment: (NOTE) Fact Sheet for Patients: BloggerCourse.com  Fact Sheet for Healthcare Providers: SeriousBroker.it  This test is not yet approved or cleared by the [place] FDA and has been authorized for detection and/or diagnosis of SARS-CoV-2 by FDA under an Emergency Use Authorization (EUA). This EUA will remain in effect (meaning this test can be used) for  the duration of the COVID-19 declaration under Section 564(b)(1) of the Act, 21 U.S.C. section 360bbb-3(b)(1), unless the authorization is terminated or revoked.  Performed at [facility], [address] [place], [place] [address]   Culture, blood (Routine X 2) w Reflex to ID Panel     Status: None (Preliminary result)   Collection Time: 12/11/23  1:00 PM   Specimen: BLOOD  Result Value Ref Range Status   Specimen Description BLOOD BLOOD RIGHT ARM  Final   Special Requests   Final    BOTTLES DRAWN AEROBIC AND ANAEROBIC Blood Culture adequate volume   Culture   Final    NO GROWTH 4 DAYS Performed at [facility], [address], [place], [place] [address]    Report Status PENDING  Incomplete  Culture, blood (Routine X 2) w Reflex to ID Panel     Status: None (Preliminary result)   Collection Time: 12/11/23  1:17 PM   Specimen: BLOOD  Result Value Ref Range Status   Specimen Description BLOOD BLOOD RIGHT HAND  Final   Special Requests   Final    Blood Culture results may not be optimal due to an inadequate volume of blood received in culture bottles   Culture   Final    NO GROWTH 4 DAYS Performed at [facility], [address] [place], [place] [address]    Report Status PENDING  Incomplete  Urine Culture (for pregnant, neutropenic or urologic patients or patients with an indwelling urinary catheter)     Status: Abnormal   Collection Time: 12/11/23  4:50 PM   Specimen: Urine, Clean Catch  Result Value Ref Range Status   Specimen Description   Final    URINE, CLEAN CATCH Performed at [facility], [address] [place], [place] [address]    Special Requests   Final    NONE Performed at [facility], [address] [place], [place] [address]    Culture >=100,000 COLONIES/mL KLEBSIELLA PNEUMONIAE (A)  Final   Report Status 12/13/2023 FINAL  Final   Organism ID, Bacteria KLEBSIELLA PNEUMONIAE (A)  Final       Susceptibility   Klebsiella pneumoniae - MIC*    AMPICILLIN >=32 RESISTANT Resistant     CEFAZOLIN <=4 SENSITIVE Sensitive     CEFEPIME <=0.12 SENSITIVE Sensitive     CEFTRIAXONE <=0.25 SENSITIVE Sensitive     CIPROFLOXACIN <=0.25 SENSITIVE Sensitive     GENTAMICIN <=1 SENSITIVE Sensitive     IMIPENEM <=0.25 SENSITIVE Sensitive     NITROFURANTOIN 64 INTERMEDIATE Intermediate     TRIMETH/SULFA <=20 SENSITIVE Sensitive     AMPICILLIN/SULBACTAM 8 SENSITIVE Sensitive     PIP/TAZO <=4 SENSITIVE Sensitive ug/mL    * >=100,000 COLONIES/mL KLEBSIELLA PNEUMONIAE     Labs: Basic Metabolic Panel: Recent Labs  Lab 12/10/23 0409 12/11/23 0552 12/12/23 0410 12/13/23 0430 12/14/23 0546  NA 137 135 137 142 137  K 4.0 3.9 3.7 3.7 4.3  CL 108 102 106 109 105  CO2 21* 22 21* 24 26  GLUCOSE 84 191* 242* 90 253*  BUN 21 24* 24* 24* 27*  CREATININE 1.19* 1.14* 1.11* 1.02* 1.25*  CALCIUM 8.7* 9.4 8.8* 9.0 8.7*  MG 1.5* 2.1 2.0 1.9 1.8  PHOS 3.2 3.3 4.1 3.7 3.2   Liver Function Tests: Recent Labs  Lab 12/10/23 0409 12/11/23 0552 12/12/23 0410 12/13/23 0430 12/14/23 0546  AST 20 19 18 16 15   ALT 15 16 15 12 11   ALKPHOS 78 77 69 64 63  BILITOT 0.6 0.7 0.6 0.5 0.4  PROT 6.5 7.0 6.4* 6.4* 6.6  ALBUMIN 2.6* 2.9* 2.8* 2.7* 2.6*   No results for input(s): "LIPASE", "AMYLASE" in the last 168 hours. No results for input(s): "AMMONIA" in the last 168 hours. CBC: Recent Labs  Lab 12/10/23 0409 12/11/23 0552 12/12/23 0410 12/13/23 0430 12/14/23 0546  WBC 10.7* 10.4 10.0 11.8* 11.7*  NEUTROABS 6.5 7.1 6.3 7.7 7.1  HGB 10.1* 10.8* 10.6* 10.7* 10.2*  HCT 32.2* 34.2* 34.2* 34.2* 33.4*  MCV 73.9* 72.5* 74.8* 74.3* 75.1*  PLT 253 283 268 288 322   Cardiac Enzymes: No results for input(s): "CKTOTAL", "CKMB", "CKMBINDEX", "TROPONINI" in the last 168 hours. BNP: BNP (last 3 results) No results for input(s): "BNP" in the last 8760  hours.  ProBNP (last 3 results) No results for input(s):  "PROBNP" in the last 8760 hours.  CBG: Recent Labs  Lab 12/14/23 1140 12/14/23 1700 12/14/23 2016 12/15/23 0747 12/15/23 1136  GLUCAP 309* 107* 208* 190* 398*       Signed:  Raymonde Calico MD.  [facility] 12/15/2023, 11:48 AM

## 2023-12-15 NOTE — Inpatient Diabetes Management (Signed)
 Inpatient Diabetes Program Recommendations  AACE/ADA: New Consensus Statement on Inpatient Glycemic Control (2015)  Target Ranges:  Prepandial:   less than 140 mg/dL      Peak postprandial:   less than 180 mg/dL (1-2 hours)      Critically ill patients:  140 - 180 mg/dL    Latest Reference Range & Units 12/14/23 08:13 12/14/23 11:40 12/14/23 17:00 12/14/23 20:16  Glucose-Capillary 70 - 99 mg/dL 027 (H)  5 units Novolog  309 (H)  11 units Novolog  107 (H) 208 (H)  5 units Novolog  10 units Semglee  (H): Data is abnormally high  Latest Reference Range & Units 12/15/23 07:47  Glucose-Capillary 70 - 99 mg/dL 253 (H)  3 units Novolog   (H): Data is abnormally high    Home DM Meds: Metformin 500 mg BID  Current Orders: Semglee 10 units at bedtime     Novolog Moderate Correction Scale/ SSI (0-15 units) TID AC + HS    MD- Note CBG 190 this AM.  Getting Ensure Enlive PO supps BID + PO diet.  Please consider:  1.  Increase the Semglee slightly to 12 units at bedtime  2. Start low dose Novolog Meal Coverage: Novolog 2 units TID with meals HOLD if pt NPO HOLD if pt eats <50% meals    --Will follow patient during hospitalization--  Langston Pippins RN, MSN, CDCES Diabetes Coordinator Inpatient Glycemic Control Team Team Pager: [phone] (8a-5p)

## 2023-12-15 NOTE — Plan of Care (Signed)

## 2023-12-15 NOTE — TOC Transition Note (Addendum)
 Transition of Care [facility]) - Discharge Note   Patient Details  Name: Cassandra [name] MRN: [id number] Date of Birth: 10/06/1932  Transition of [facility] CM/SW Contact:  Elsie Halo, RN Phone Number: 12/15/2023, 11:44 AM   Clinical Narrative:    Patient is medically clear to dc to [facility] for short term rehab. The patient and her family are agreeable with the dc plan. The patients husband will transport her.No other TOC needs identified.    Barriers to Discharge: Insurance Authorization   Patient Goals and CMS Choice            Discharge Placement                       Discharge Plan and Services Additional resources added to the After Visit Summary for     Discharge Planning Services: CM Consult                                 Social Drivers of Health (SDOH) Interventions SDOH Screenings   Food Insecurity: No Food Insecurity (12/09/2023)  Housing: Low Risk  (12/09/2023)  Transportation Needs: No Transportation Needs (12/09/2023)  Utilities: Not At Risk (12/09/2023)  Depression (PHQ2-9): Low Risk  (03/20/2023)  Financial Resource Strain: Low Risk  (10/04/2018)  Physical Activity: Unknown (10/04/2018)  Social Connections: Socially Isolated (12/09/2023)  Tobacco Use: Medium Risk (12/08/2023)     Readmission Risk Interventions     No data to display

## 2023-12-16 ENCOUNTER — Ambulatory Visit: Payer: Medicare Other | Admitting: Physical Therapy

## 2023-12-16 LAB — CULTURE, BLOOD (ROUTINE X 2)
Culture: NO GROWTH
Culture: NO GROWTH
Special Requests: ADEQUATE

## 2023-12-18 ENCOUNTER — Ambulatory Visit: Payer: Medicare Other | Admitting: Physical Therapy

## 2023-12-23 ENCOUNTER — Ambulatory Visit: Payer: Medicare Other | Admitting: Physical Therapy

## 2023-12-25 ENCOUNTER — Telehealth: Payer: Self-pay

## 2023-12-25 ENCOUNTER — Ambulatory Visit: Payer: Medicare Other | Admitting: Physical Therapy

## 2023-12-25 ENCOUNTER — Ambulatory Visit (INDEPENDENT_AMBULATORY_CARE_PROVIDER_SITE_OTHER): Admitting: Gastroenterology

## 2023-12-25 VITALS — BP 115/72 | HR 78 | Temp 97.9°F

## 2023-12-25 DIAGNOSIS — R1319 Other dysphagia: Secondary | ICD-10-CM

## 2023-12-25 DIAGNOSIS — R1310 Dysphagia, unspecified: Secondary | ICD-10-CM | POA: Diagnosis not present

## 2023-12-25 NOTE — Telephone Encounter (Signed)
 [employer] and I was not able to leave a voicemail since nobody ever came to the phone. I will try again to reach out with the manager in charge of patient's food. Dr. Antony Baumgartner would [facility] to speak with him/her in reference to the food that the patient would like to eat. Patient stated that she liked to eat fruit loops and frosted flakes with milk and some coffee. She stated that for lunch she is still full from lunch so she skips the meal and then for dinner she would like to eat soft foods.

## 2023-12-25 NOTE — Patient Instructions (Signed)
 We will call [facility] and let them know that they will need to accommodate your food. Please let us  know if they don't, we will keep trying until they do.

## 2023-12-25 NOTE — Progress Notes (Signed)
 Luke Salaam MD, MRCP(U.K) [address]  [address]  [place], [place] [address]  Main: [phone]  Fax: [phone]   Primary Care Physician: Yehuda Helms, MD  Primary Gastroenterologist:  Dr. Luke Salaam   Chief Complaint  Patient presents with   Dysphagia    HPI: Cassandra [name] is a 89 y.o. female  Summary of history :   Referred and seen in October 2023 for dysphagia.  She states that she has difficulty swallowing small pills to get lost when she tries to swallow but takes it with applesauce denies any coughing denies any actual pills that get lodged into her esophagus when she swallows.  She has to be on oxygen but is still waiting for her oxygen.Previously seen and evaluated by [facility] clinic gastroenterology in March 20 21-20 23 for the same.  Issues with dysphagia ongoing since 2019.  Recently seen at [facility] office in March 2023 March 2021 barium showed mild prominence of cricopharyngeus fold.  Plan at that time was for barium swallow.   11/14/2021 barium swallow showed study positive for aspiration nonspecific esophageal dysmotility noted small hiatal hernia patient refused barium tablet, was recommended modified barium swallow.   08/16/2022 modified barium swallow shows laryngeal penetration without frank aspiration, During the evaluation it was noted that when she consumes drinks via straw that led her to cough and she was advised to consume fluids without a straw and no further intervention is required at this point of time.  She was seen by Dr. Jaclynn Mast on 11/13/2023 and notes suggest that her breathing is not good despite using oxygen at home.  Plan is to repeat a pulmonary function test in 6 months    Interval history 11/25/2023-12/25/2023  On 12/09/2023 and admitted to the hospital she was seen by Dr. Ole Berkeley for dysphagia.  He obtained a barium swallow on 12/09/2023 that showed presence by esophagus moderate to severe dysmotility notable delay in the passage of  the barium tablet taking over 30 seconds and no evidence of stricture.  The following day she was eating eggs and toast without any issues she was seen by speech pathology and no further evaluation by GI was recommended.  Speech pathology noted there was no issues with transfer dysphagia.  She was able to consume all consistencies of food and recommended mechanical soft diet with minced meats and well moisture and food reduced distractions during meals.    CT scan of the abdomen on 11/11/2023 that showed moderate volume of stool within the colon mild increase in degree of intrahepatic and extrahepatic biliary dilation.  LFTs were normal in April 2025.    Today 2 she says that when she eats a lot of food it gets accumulated in her esophagus and does not go down.  She was very frustrated very upset.  She says that certain foods go down such as grits and porridge which she would like to eat but does not get access to the same at [employer].   Current Outpatient Medications  Medication Sig Dispense Refill   acetaminophen  (TYLENOL ) 325 MG tablet Take 1-2 tablets (325-650 mg total) by mouth every 6 (six) hours as needed for mild pain (pain score 1-3 or temp > 100.5).     amitriptyline  (ELAVIL ) 10 MG tablet Take 1 tablet by mouth at bedtime.     apixaban  (ELIQUIS ) 2.5 MG TABS tablet Take 1 tablet (2.5 mg total) by mouth 2 (two) times daily. 60 tablet 0   clopidogrel  (PLAVIX )  75 MG tablet Take 1 tablet (75 mg total) by mouth daily. 30 tablet 0   docusate sodium  (COLACE) 100 MG capsule Take 1 capsule (100 mg total) by mouth 2 (two) times daily. 10 capsule 0   gabapentin  (NEURONTIN ) 100 MG capsule Take 1 capsule (100 mg total) by mouth 2 (two) times daily as needed (severe phantom limb pain). (Patient taking differently: Take 100 mg by mouth daily as needed (severe phantom limb pain).) 30 capsule 0   gabapentin  (NEURONTIN ) 100 MG capsule Take 1 capsule by mouth 3 (three) times daily.     hydrOXYzine   (ATARAX ) 10 MG tablet Take 1 tablet (10 mg total) by mouth 3 (three) times daily as needed. 30 tablet 1   levothyroxine  (SYNTHROID ) 88 MCG tablet Take 1 tablet (88 mcg total) by mouth daily before breakfast.     Melatonin 10 MG CAPS Take 10 mg by mouth at bedtime as needed (sleep).     metFORMIN  (GLUCOPHAGE ) 500 MG tablet Take 1 tablet (500 mg total) by mouth 2 (two) times daily with a meal. 60 tablet 0   methocarbamol  (ROBAXIN ) 500 MG tablet Take 1 tablet (500 mg total) by mouth every 6 (six) hours as needed for muscle spasms. 30 tablet 0   metoprolol  tartrate (LOPRESSOR ) 25 MG tablet Take 1 tablet (25 mg total) by mouth 2 (two) times daily. 60 tablet 2   Multiple Vitamin (MULTIVITAMIN WITH MINERALS) TABS tablet Take 1 tablet by mouth daily. 30 tablet 0   nitroGLYCERIN  (NITROSTAT ) 0.4 MG SL tablet Place 1 tablet (0.4 mg total) under the tongue every 5 (five) minutes as needed for chest pain. 90 tablet 0   pantoprazole  (PROTONIX ) 40 MG tablet Take 40 mg by mouth daily.     pregabalin  (LYRICA ) 25 MG capsule Take 1 capsule (25 mg total) by mouth at bedtime. 30 capsule 0   rosuvastatin  (CRESTOR ) 10 MG tablet Take 10 mg by mouth at bedtime.     telmisartan (MICARDIS) 40 MG tablet Take 40 mg by mouth daily.     torsemide  (DEMADEX ) 20 MG tablet Take 1 tablet (20 mg total) by mouth daily. 30 tablet 0   Vibegron  (GEMTESA ) 75 MG TABS Take 1 tablet (75 mg total) by mouth daily.     VITAMIN D  PO Take 1 capsule by mouth daily.     Vitamin D , Ergocalciferol , (DRISDOL ) 1.25 MG (50000 UNIT) CAPS capsule Take 1 capsule (50,000 Units total) by mouth every 7 (seven) days. Once a week on Thursdays 5 capsule 0   No current facility-administered medications for this visit.    Allergies as of 12/25/2023 - Review Complete 12/25/2023  Allergen Reaction Noted   Baclofen Other (See Comments) 06/07/2022   Levofloxacin Other (See Comments) 07/10/2021     ROS:  General: Negative for anorexia, weight loss, fever,  chills, fatigue, weakness. ENT: Negative for hoarseness, difficulty swallowing , nasal congestion. CV: Negative for chest pain, angina, palpitations, dyspnea on exertion, peripheral edema.  Respiratory: Negative for dyspnea at rest, dyspnea on exertion, cough, sputum, wheezing.  GI: See history of present illness. GU:  Negative for dysuria, hematuria, urinary incontinence, urinary frequency, nocturnal urination.  Endo: Negative for unusual weight change.    Physical Examination:   BP 115/72   Pulse 78   Temp 97.9 F (36.6 C) (Oral)   LMP  (LMP Unknown) Comment: age 16  General: Well-nourished, well-developed in no acute distress.  Eyes: No icterus. Conjunctivae pink. Mouth: Oropharyngeal mucosa moist and pink , no lesions  erythema or exudate. Neuro: Alert and oriented x 3.  Grossly intact. Skin: Warm and dry, no jaundice.   Psych: Alert and cooperative, normal mood and affect.   Imaging Studies: CT HEAD WO CONTRAST ( ) Result Date: 12/10/2023 CLINICAL DATA:  Initial evaluation for acute delirium, history of prior stroke. EXAM: CT HEAD WITHOUT CONTRAST TECHNIQUE: Contiguous axial images were obtained from the base of the skull through the vertex without intravenous contrast. RADIATION DOSE REDUCTION: This exam was performed according to the departmental dose-optimization program which includes automated exposure control, adjustment of the mA and/or kV according to patient size and/or use of iterative reconstruction technique. COMPARISON:  Prior study from 08/23/2022. FINDINGS: Brain: Cerebral volume within normal limits for age. Mild chronic microvascular ischemic disease noted involving the supratentorial cerebral white matter. Few small remote bilateral cerebellar infarcts noted. No acute intracranial hemorrhage. No acute large vessel territory infarct. No mass lesion or midline shift. Mild ventricular prominence related to global parenchymal volume loss without hydrocephalus. No  extra-axial fluid collection. Empty sella noted. Vascular: No abnormal hyperdense vessel. Calcified atherosclerosis present at the skull base. Skull: Scalp soft tissues within normal limits.  Calvarium intact. Sinuses/Orbits: Globes orbital soft tissues within normal limits. Paranasal sinuses are largely clear. No mastoid effusion. Other: None. IMPRESSION: 1. No acute intracranial abnormality. 2. Mild chronic microvascular ischemic disease with a few small remote bilateral cerebellar infarcts. Electronically Signed   By: Virgia Griffins M.D.   On: 12/10/2023 19:39   DG ESOPHAGUS W SINGLE CM (SOL OR THIN BA) Result Date: 12/09/2023 CLINICAL DATA:  89 year old female with difficulty swallowing, and reflux with vomiting. EXAM: ESOPHAGUS/BARIUM SWALLOW/TABLET STUDY TECHNIQUE: Single contrast examination was performed using thin liquid barium. This exam was performed by Lambert Pillion, PA-C, and was supervised and interpreted by Dr. Collie Days, MD. FLUOROSCOPY: Radiation Exposure Index (as provided by the fluoroscopic device): 27.20 mGy Kerma COMPARISON:  DG swallow function [employer] speech pathology on 08/16/2022 DG esophagus with double contrast media on 11/14/2021 FINDINGS: Swallowing: Appears normal. No vestibular penetration or aspiration seen. Pharynx: Unremarkable. Esophagus: Presbyesophagus Esophageal motility: Moderate to severe esophageal dysmotility with retropulsion and tertiary contractions Hiatal Hernia: None. Gastroesophageal reflux: Unable to assess due to low contrast volume in stomach. Ingested 13mm barium tablet: Became stuck Other: Study severely limited by patient's inability to stand, repositioned nor swallow thin liquids. Patient tilted at less than 45 degrees. IMPRESSION: 1.  Presbyesophagus. Moderate to severe dysmotility. 2. Moderate to severe dysmotility with retropulsion and notable tertiary contractions. 3.  Unable to assess for reflux due to low gastric contrast volume. 4. Notable  delay in passing of barium tablet, greater than 30 seconds. No evidence of stricture. Procedure performed by Lambert Pillion, PA-C Electronically Signed   By: Rosalene Colon M.D.   On: 12/09/2023 14:13    Assessment and Plan:   Cassandra [name] is a 89 y.o. y/o female here to follow-up with significant history of dysphagia due to esophageal dysmotility. During recent admission in April 2025 was seen and assessed by speech pathology recently with no issues of transfer dysphagia was suggested mechanical soft diet in addition to other interventions that are conservative in their note.  She was also noted to have some cognitive decline.  From the GI point of view there is no blockage of the esophagus hence no indication for EGD especially considering the increased risk.  In terms of esophageal dysmotility we do not have any safe medications to use in her case.  The options would  only include conservative management with eating small bites spaced intervals while sitting upright washing it down with some fluid.  Down the road an option would be a G-tube for nutrition if needed but that needs to be put into context with her overall health and goals of care.  She was obviously not too excited about G-tube which is understandable.  I had a long discussion with her and from my understanding it appears that certain foods go down well and certain foods do not the foods that she gets at [employer] seem to have a problem with her dysphagia and she thinks they may be very dry.  During the whole office visit I noticed that she was having difficulty with articulation of speech and her mouth was extremely dry as well.  She did say that certain foods such as grits porridge go down very easily and she does not have a problem but she does not have access to this at [employer].  I explained to her that I will have my office get in touch with [employer] so that I can speak to them and explained to them the situation  and see if we can have them provide the food that is acceptable to the patient.  The hope is that we can do what ever we can to improve her quality of life.  Mentally today she appeared to be pretty sharp.  I also suggested that she sip on liquids throughout her meals and have small meals more often rather than large meals at 1 time.  My office should help coordinate a phone call between myself and [employer] to explain the situation to them next week    Dr Luke Salaam  MD,MRCP [facility]) Follow up as needed

## 2023-12-29 NOTE — Telephone Encounter (Signed)
 Dr. Antony Baumgartner, I called [facility] [employer] and asked to speak with Mrs. Roggenkamp's nurse. I was then transferred and spoke with Deandra, RN and let her know that you would like to speak with someone to provide information for her daily meals. I was informed that you would be receiving a call from Dr. Jaye Mettle to discuss your concerns. Your phone number was provided.

## 2023-12-30 ENCOUNTER — Ambulatory Visit: Payer: Medicare Other | Admitting: Physical Therapy

## 2024-01-01 ENCOUNTER — Ambulatory Visit: Payer: Medicare Other | Admitting: Physical Therapy

## 2024-01-06 ENCOUNTER — Ambulatory Visit: Payer: Medicare Other | Admitting: Physical Therapy

## 2024-01-08 ENCOUNTER — Ambulatory Visit: Payer: Medicare Other | Admitting: Physical Therapy

## 2024-01-13 ENCOUNTER — Ambulatory Visit: Payer: Medicare Other | Admitting: Physical Therapy

## 2024-01-15 ENCOUNTER — Ambulatory Visit: Payer: Medicare Other | Admitting: Physical Therapy

## 2024-01-20 ENCOUNTER — Ambulatory Visit: Payer: Medicare Other | Admitting: Physical Therapy
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
## 2024-01-21 ENCOUNTER — Ambulatory Visit: Attending: Internal Medicine | Admitting: Physical Therapy

## 2024-01-21 DIAGNOSIS — Z89512 Acquired absence of left leg below knee: Secondary | ICD-10-CM | POA: Diagnosis present

## 2024-01-21 DIAGNOSIS — R2689 Other abnormalities of gait and mobility: Secondary | ICD-10-CM | POA: Insufficient documentation

## 2024-01-21 DIAGNOSIS — M6281 Muscle weakness (generalized): Secondary | ICD-10-CM | POA: Insufficient documentation

## 2024-01-21 DIAGNOSIS — R269 Unspecified abnormalities of gait and mobility: Secondary | ICD-10-CM | POA: Insufficient documentation

## 2024-01-22 ENCOUNTER — Ambulatory Visit: Payer: Medicare Other | Admitting: Physical Therapy

## 2024-01-27 ENCOUNTER — Ambulatory Visit: Payer: Medicare Other | Admitting: Physical Therapy

## 2024-01-28 ENCOUNTER — Other Ambulatory Visit: Payer: Self-pay

## 2024-01-28 ENCOUNTER — Encounter: Payer: Self-pay | Admitting: Emergency Medicine

## 2024-01-28 ENCOUNTER — Observation Stay
Admission: EM | Admit: 2024-01-28 | Discharge: 2024-01-30 | Disposition: A | Discharge status: Home / Self Care | Attending: Internal Medicine | Admitting: Internal Medicine

## 2024-01-28 DIAGNOSIS — I130 Hypertensive heart and chronic kidney disease with heart failure and stage 1 through stage 4 chronic kidney disease, or unspecified chronic kidney disease: Secondary | ICD-10-CM | POA: Diagnosis not present

## 2024-01-28 DIAGNOSIS — E1122 Type 2 diabetes mellitus with diabetic chronic kidney disease: Secondary | ICD-10-CM | POA: Insufficient documentation

## 2024-01-28 DIAGNOSIS — E039 Hypothyroidism, unspecified: Secondary | ICD-10-CM | POA: Insufficient documentation

## 2024-01-28 DIAGNOSIS — I10 Essential (primary) hypertension: Secondary | ICD-10-CM | POA: Diagnosis present

## 2024-01-28 DIAGNOSIS — Z9861 Coronary angioplasty status: Secondary | ICD-10-CM | POA: Diagnosis not present

## 2024-01-28 DIAGNOSIS — F32A Depression, unspecified: Secondary | ICD-10-CM | POA: Insufficient documentation

## 2024-01-28 DIAGNOSIS — Z7984 Long term (current) use of oral hypoglycemic drugs: Secondary | ICD-10-CM | POA: Insufficient documentation

## 2024-01-28 DIAGNOSIS — Z7901 Long term (current) use of anticoagulants: Secondary | ICD-10-CM | POA: Insufficient documentation

## 2024-01-28 DIAGNOSIS — N1832 Chronic kidney disease, stage 3b: Secondary | ICD-10-CM | POA: Diagnosis not present

## 2024-01-28 DIAGNOSIS — I5042 Chronic combined systolic (congestive) and diastolic (congestive) heart failure: Secondary | ICD-10-CM | POA: Diagnosis not present

## 2024-01-28 DIAGNOSIS — E663 Overweight: Secondary | ICD-10-CM | POA: Diagnosis not present

## 2024-01-28 DIAGNOSIS — N179 Acute kidney failure, unspecified: Secondary | ICD-10-CM | POA: Diagnosis not present

## 2024-01-28 DIAGNOSIS — I2510 Atherosclerotic heart disease of native coronary artery without angina pectoris: Secondary | ICD-10-CM | POA: Insufficient documentation

## 2024-01-28 DIAGNOSIS — E785 Hyperlipidemia, unspecified: Secondary | ICD-10-CM | POA: Insufficient documentation

## 2024-01-28 DIAGNOSIS — I739 Peripheral vascular disease, unspecified: Secondary | ICD-10-CM | POA: Diagnosis not present

## 2024-01-28 DIAGNOSIS — Z85828 Personal history of other malignant neoplasm of skin: Secondary | ICD-10-CM | POA: Diagnosis not present

## 2024-01-28 DIAGNOSIS — F419 Anxiety disorder, unspecified: Secondary | ICD-10-CM | POA: Diagnosis not present

## 2024-01-28 DIAGNOSIS — I480 Paroxysmal atrial fibrillation: Secondary | ICD-10-CM | POA: Insufficient documentation

## 2024-01-28 DIAGNOSIS — Z7902 Long term (current) use of antithrombotics/antiplatelets: Secondary | ICD-10-CM | POA: Insufficient documentation

## 2024-01-28 DIAGNOSIS — J449 Chronic obstructive pulmonary disease, unspecified: Secondary | ICD-10-CM | POA: Diagnosis not present

## 2024-01-28 DIAGNOSIS — R799 Abnormal finding of blood chemistry, unspecified: Secondary | ICD-10-CM | POA: Diagnosis present

## 2024-01-28 DIAGNOSIS — E875 Hyperkalemia: Principal | ICD-10-CM | POA: Insufficient documentation

## 2024-01-28 DIAGNOSIS — Z87891 Personal history of nicotine dependence: Secondary | ICD-10-CM | POA: Diagnosis not present

## 2024-01-28 DIAGNOSIS — Z79899 Other long term (current) drug therapy: Secondary | ICD-10-CM | POA: Insufficient documentation

## 2024-01-28 DIAGNOSIS — E1129 Type 2 diabetes mellitus with other diabetic kidney complication: Secondary | ICD-10-CM | POA: Diagnosis present

## 2024-01-28 DIAGNOSIS — Z950 Presence of cardiac pacemaker: Secondary | ICD-10-CM | POA: Diagnosis not present

## 2024-01-28 DIAGNOSIS — F418 Other specified anxiety disorders: Secondary | ICD-10-CM | POA: Diagnosis present

## 2024-01-28 DIAGNOSIS — Z853 Personal history of malignant neoplasm of breast: Secondary | ICD-10-CM | POA: Insufficient documentation

## 2024-01-28 DIAGNOSIS — Z6825 Body mass index (BMI) 25.0-25.9, adult: Secondary | ICD-10-CM | POA: Insufficient documentation

## 2024-01-28 LAB — BRAIN NATRIURETIC PEPTIDE: B Natriuretic Peptide: 257.5 pg/mL — ABNORMAL HIGH (ref 0.0–100.0)

## 2024-01-28 LAB — COMPREHENSIVE METABOLIC PANEL WITH GFR
ALT: 12 U/L (ref 0–44)
AST: 16 U/L (ref 15–41)
Albumin: 3.2 g/dL — ABNORMAL LOW (ref 3.5–5.0)
Alkaline Phosphatase: 83 U/L (ref 38–126)
Anion gap: 7 (ref 5–15)
BUN: 38 mg/dL — ABNORMAL HIGH (ref 8–23)
CO2: 20 mmol/L — ABNORMAL LOW (ref 22–32)
Calcium: 9.1 mg/dL (ref 8.9–10.3)
Chloride: 108 mmol/L (ref 98–111)
Creatinine, Ser: 1.77 mg/dL — ABNORMAL HIGH (ref 0.44–1.00)
GFR, Estimated: 27 mL/min — ABNORMAL LOW (ref >60)
Glucose, Bld: 214 mg/dL — ABNORMAL HIGH (ref 70–99)
Potassium: 6.7 mmol/L (ref 3.5–5.1)
Sodium: 135 mmol/L (ref 135–145)
Total Bilirubin: 0.4 mg/dL (ref 0.0–1.2)
Total Protein: 7.5 g/dL (ref 6.5–8.1)

## 2024-01-28 LAB — CBC
HCT: 33 % — ABNORMAL LOW (ref 36.0–46.0)
Hemoglobin: 10.3 g/dL — ABNORMAL LOW (ref 12.0–15.0)
MCH: 23.4 pg — ABNORMAL LOW (ref 26.0–34.0)
MCHC: 31.2 g/dL (ref 30.0–36.0)
MCV: 75 fL — ABNORMAL LOW (ref 80.0–100.0)
Platelets: 281 10*3/uL (ref 150–400)
RBC: 4.4 MIL/uL (ref 3.87–5.11)
RDW: 18.9 % — ABNORMAL HIGH (ref 11.5–15.5)
WBC: 10.3 10*3/uL (ref 4.0–10.5)
nRBC: 0 % (ref 0.0–0.2)

## 2024-01-28 LAB — GLUCOSE, CAPILLARY: Glucose-Capillary: 252 mg/dL — ABNORMAL HIGH (ref 70–99)

## 2024-01-28 LAB — POTASSIUM: Potassium: 5.4 mmol/L — ABNORMAL HIGH (ref 3.5–5.1)

## 2024-01-28 MED ORDER — SODIUM CHLORIDE 0.9 % IV BOLUS
500.0000 mL | Freq: Once | INTRAVENOUS | Status: AC
  Administered 2024-01-28: 500 mL via INTRAVENOUS

## 2024-01-28 MED ORDER — NITROGLYCERIN 0.4 MG SL SUBL: 0.4000 mg | SUBLINGUAL_TABLET | SUBLINGUAL | Status: DC | PRN

## 2024-01-28 MED ORDER — CALCIUM GLUCONATE-NACL 1-0.675 GM/50ML-% IV SOLN
1.0000 g | Freq: Once | INTRAVENOUS | Status: AC
  Administered 2024-01-28: 1000 mg via INTRAVENOUS
  Filled 2024-01-28: qty 50

## 2024-01-28 MED ORDER — ALBUTEROL SULFATE (2.5 MG/3ML) 0.083% IN NEBU
10.0000 mg | INHALATION_SOLUTION | Freq: Once | RESPIRATORY_TRACT | Status: AC
  Administered 2024-01-28: 10 mg via RESPIRATORY_TRACT
  Filled 2024-01-28: qty 12

## 2024-01-28 MED ORDER — CLOPIDOGREL BISULFATE 75 MG PO TABS
75.0000 mg | ORAL_TABLET | Freq: Every day | ORAL | Status: DC
  Administered 2024-01-29 – 2024-01-30 (×2): 75 mg via ORAL
  Filled 2024-01-28 (×2): qty 1

## 2024-01-28 MED ORDER — APIXABAN 2.5 MG PO TABS
2.5000 mg | ORAL_TABLET | Freq: Two times a day (BID) | ORAL | Status: DC
  Administered 2024-01-29 – 2024-01-30 (×3): 2.5 mg via ORAL
  Filled 2024-01-28 (×4): qty 1

## 2024-01-28 MED ORDER — HYDROXYZINE HCL 10 MG PO TABS: 10.0000 mg | ORAL_TABLET | Freq: Three times a day (TID) | ORAL | Status: DC | PRN

## 2024-01-28 MED ORDER — ALBUTEROL SULFATE (2.5 MG/3ML) 0.083% IN NEBU: 3.0000 mL | INHALATION_SOLUTION | RESPIRATORY_TRACT | Status: DC | PRN

## 2024-01-28 MED ORDER — PANTOPRAZOLE SODIUM 40 MG PO TBEC
40.0000 mg | DELAYED_RELEASE_TABLET | Freq: Every day | ORAL | Status: DC
  Administered 2024-01-29 – 2024-01-30 (×2): 40 mg via ORAL
  Filled 2024-01-28 (×2): qty 1

## 2024-01-28 MED ORDER — METOPROLOL TARTRATE 25 MG PO TABS
25.0000 mg | ORAL_TABLET | Freq: Two times a day (BID) | ORAL | Status: DC
  Administered 2024-01-29 – 2024-01-30 (×3): 25 mg via ORAL
  Filled 2024-01-28 (×3): qty 1

## 2024-01-28 MED ORDER — MELATONIN 5 MG PO TABS
5.0000 mg | ORAL_TABLET | Freq: Every day | ORAL | Status: DC
  Administered 2024-01-28 – 2024-01-29 (×2): 5 mg via ORAL
  Filled 2024-01-28 (×2): qty 1

## 2024-01-28 MED ORDER — MIRABEGRON ER 25 MG PO TB24
25.0000 mg | ORAL_TABLET | Freq: Every day | ORAL | Status: DC
  Administered 2024-01-29 – 2024-01-30 (×2): 25 mg via ORAL
  Filled 2024-01-28 (×2): qty 1

## 2024-01-28 MED ORDER — SODIUM ZIRCONIUM CYCLOSILICATE 10 G PO PACK
10.0000 g | PACK | Freq: Once | ORAL | Status: AC
  Administered 2024-01-28: 10 g via ORAL
  Filled 2024-01-28: qty 1

## 2024-01-28 MED ORDER — INSULIN ASPART 100 UNIT/ML IV SOLN
10.0000 [IU] | Freq: Once | INTRAVENOUS | Status: AC
Start: 2024-01-28 — End: 2024-01-28
  Administered 2024-01-28: 10 [IU] via INTRAVENOUS
  Filled 2024-01-28: qty 0.1

## 2024-01-28 MED ORDER — DEXTROSE 50 % IV SOLN
1.0000 | Freq: Once | INTRAVENOUS | Status: AC
Start: 2024-01-28 — End: 2024-01-28
  Administered 2024-01-28: 50 mL via INTRAVENOUS
  Filled 2024-01-28: qty 50

## 2024-01-28 MED ORDER — MELATONIN 5 MG PO TABS: 10.0000 mg | ORAL_TABLET | Freq: Every evening | ORAL | Status: DC | PRN

## 2024-01-28 MED ORDER — ROSUVASTATIN CALCIUM 10 MG PO TABS
10.0000 mg | ORAL_TABLET | Freq: Every day | ORAL | Status: DC
  Administered 2024-01-29: 10 mg via ORAL
  Filled 2024-01-28: qty 1

## 2024-01-28 MED ORDER — METHOCARBAMOL 500 MG PO TABS: 500.0000 mg | ORAL_TABLET | Freq: Four times a day (QID) | ORAL | Status: DC | PRN

## 2024-01-28 MED ORDER — GABAPENTIN 100 MG PO CAPS
100.0000 mg | ORAL_CAPSULE | Freq: Three times a day (TID) | ORAL | Status: DC
  Administered 2024-01-29 – 2024-01-30 (×3): 100 mg via ORAL
  Filled 2024-01-28 (×3): qty 1

## 2024-01-28 MED ORDER — DM-GUAIFENESIN ER 30-600 MG PO TB12: 1.0000 | ORAL_TABLET | Freq: Two times a day (BID) | ORAL | Status: DC | PRN

## 2024-01-28 MED ORDER — LEVOTHYROXINE SODIUM 88 MCG PO TABS
88.0000 ug | ORAL_TABLET | Freq: Every day | ORAL | Status: DC
  Administered 2024-01-29 – 2024-01-30 (×2): 88 ug via ORAL
  Filled 2024-01-28 (×2): qty 1

## 2024-01-28 MED ORDER — DOCUSATE SODIUM 100 MG PO CAPS: 100.0000 mg | ORAL_CAPSULE | Freq: Two times a day (BID) | ORAL | Status: DC | PRN

## 2024-01-28 MED ORDER — HYDRALAZINE HCL 20 MG/ML IJ SOLN: 5.0000 mg | INTRAMUSCULAR | Status: DC | PRN

## 2024-01-28 MED ORDER — ONDANSETRON HCL 4 MG/2ML IJ SOLN: 4.0000 mg | Freq: Three times a day (TID) | INTRAMUSCULAR | Status: DC | PRN

## 2024-01-28 MED ORDER — ACETAMINOPHEN 325 MG PO TABS: 650.0000 mg | ORAL_TABLET | Freq: Four times a day (QID) | ORAL | Status: DC | PRN

## 2024-01-28 MED ORDER — AMITRIPTYLINE HCL 10 MG PO TABS
10.0000 mg | ORAL_TABLET | Freq: Every day | ORAL | Status: DC
  Administered 2024-01-29: 10 mg via ORAL
  Filled 2024-01-28: qty 1

## 2024-01-28 NOTE — ED Triage Notes (Signed)
 Patient to ED via POV for abnormal lab. Pt had blood work drawn this AM- potassium 7.4. Pt denies any complaints at this time.

## 2024-01-28 NOTE — ED Provider Notes (Signed)
 [facility] Provider Note    Event Date/Time   First MD Initiated Contact with Patient 01/28/24 1734     (approximate)   History   Chief Complaint Abnormal Lab   HPI  Cassandra [name] is a 89 y.o. female with past medical history of diabetes, CAD, CHF, atrial fibrillation on Eliquis , CKD, COPD, and hypothyroidism who presents to the ED for abnormal labs.  Patient reports that she had follow-up with her PCP today following admission last month for AKI and hyperkalemia.  She received a phone call shortly after the visit that her potassium was elevated and that she needed to come to the hospital.  Currently, her only complaint is ongoing dysuria following a course of antibiotics for UTI that she finished yesterday.  She denies any abdominal pain, fevers, or flank pain.  She states that she continues to make a normal amount of urine, has been eating and drinking normally.  She does eat 1 banana daily with breakfast.     Physical Exam   Triage Vital Signs: ED Triage Vitals  Encounter Vitals Group     BP 01/28/24 1729 107/79     Systolic BP Percentile --      Diastolic BP Percentile --      Pulse Rate 01/28/24 1729 71     Resp 01/28/24 1729 17     Temp 01/28/24 1729 97.8 F (36.6 C)     Temp Source 01/28/24 1729 Oral     SpO2 01/28/24 1729 93 %     Weight 01/28/24 1727 134 lb (60.8 kg)     Height 01/28/24 1727 4\' 11"  (1.499 m)     Head Circumference --      Peak Flow --      Pain Score 01/28/24 1727 0     Pain Loc --      Pain Education --      Exclude from Growth Chart --     Most recent vital signs: Vitals:   01/28/24 1729  BP: 107/79  Pulse: 71  Resp: 17  Temp: 97.8 F (36.6 C)  SpO2: 93%    Constitutional: Alert and oriented. Eyes: Conjunctivae are normal. Head: Atraumatic. Nose: No congestion/rhinnorhea. Mouth/Throat: Mucous membranes are moist.  Cardiovascular: Normal rate, regular rhythm. Grossly normal heart sounds.  2+ radial  pulses bilaterally. Respiratory: Normal respiratory effort.  No retractions. Lungs CTAB. Gastrointestinal: Soft and nontender. No distention. Musculoskeletal: No lower extremity tenderness nor edema.  Status post left BKA. Neurologic:  Normal speech and language. No gross focal neurologic deficits are appreciated.    ED Results / Procedures / Treatments   Labs (all labs ordered are listed, but only abnormal results are displayed) Labs Reviewed  COMPREHENSIVE METABOLIC PANEL WITH GFR - Abnormal; Notable for the following components:      Result Value   Potassium 6.7 (*)    CO2 20 (*)    Glucose, Bld 214 (*)    BUN 38 (*)    Creatinine, Ser 1.77 (*)    Albumin  3.2 (*)    GFR, Estimated 27 (*)    All other components within normal limits  CBC - Abnormal; Notable for the following components:   Hemoglobin 10.3 (*)    HCT 33.0 (*)    MCV 75.0 (*)    MCH 23.4 (*)    RDW 18.9 (*)    All other components within normal limits  URINALYSIS, ROUTINE W REFLEX MICROSCOPIC  BRAIN NATRIURETIC PEPTIDE  POTASSIUM  BASIC METABOLIC PANEL WITH GFR  CBC  CBG MONITORING, ED     EKG  ED ECG REPORT I, Twilla Galea, the attending physician, personally viewed and interpreted this ECG.   Date: 01/28/2024  EKG Time: 16:20  Rate: 70  Rhythm: normal sinus rhythm  Axis: Normal  Intervals:right bundle branch block and left anterior fascicular block  ST&T Change: None  PROCEDURES:  Critical Care performed: Yes, see critical care procedure note(s)  .Critical Care  Performed by: Twilla Galea, MD Authorized by: Twilla Galea, MD   Critical care provider statement:    Critical care time (minutes):  30   Critical care time was exclusive of:  Separately billable procedures and treating other patients and teaching time   Critical care was necessary to treat or prevent imminent or life-threatening deterioration of the following conditions:  Metabolic crisis   Critical care was time spent  personally by me on the following activities:  Development of treatment plan with patient or surrogate, discussions with consultants, evaluation of patient's response to treatment, examination of patient, ordering and review of laboratory studies, ordering and review of radiographic studies, ordering and performing treatments and interventions, pulse oximetry, re-evaluation of patient's condition and review of old charts   I assumed direction of critical care for this patient from another provider in my specialty: no     Care discussed with: admitting provider      MEDICATIONS ORDERED IN ED: Medications  albuterol  (PROVENTIL ) (2.5 MG/3ML) 0.083% nebulizer solution 10 mg (has no administration in time range)  insulin  aspart (novoLOG ) injection 10 Units (has no administration in time range)    And  dextrose  50 % solution 50 mL (has no administration in time range)  calcium  gluconate 1 g/ 50 mL sodium chloride  IVPB (has no administration in time range)  albuterol  (PROVENTIL ) (2.5 MG/3ML) 0.083% nebulizer solution 3 mL (has no administration in time range)  dextromethorphan-guaiFENesin  (MUCINEX  DM) 30-600 MG per 12 hr tablet 1 tablet (has no administration in time range)  ondansetron  (ZOFRAN ) injection 4 mg (has no administration in time range)  hydrALAZINE  (APRESOLINE ) injection 5 mg (has no administration in time range)  acetaminophen  (TYLENOL ) tablet 650 mg (has no administration in time range)  sodium chloride  0.9 % bolus 500 mL (500 mLs Intravenous New Bag/Given 01/28/24 1917)  sodium zirconium cyclosilicate  (LOKELMA ) packet 10 g (10 g Oral Given 01/28/24 1917)     IMPRESSION / MDM / ASSESSMENT AND PLAN / ED COURSE  I reviewed the triage vital signs and the nursing notes.                              89 y.o. female with a past medical history of diabetes, CAD, CHF, atrial fibrillation on Eliquis , CKD, COPD, and hypothyroidism who presents to the ED for abnormal labs and hyperkalemia noted  as an outpatient.  Patient's presentation is most consistent with acute presentation with potential threat to life or bodily function.  Differential diagnosis includes, but is not limited to, hyperkalemia, AKI, other electrolyte abnormality, lab error.  Patient nontoxic-appearing and in no acute distress, vital signs are unremarkable.  I did review her labs from PCP visit earlier today, which shows potassium of 7.4.  We will repeat here in the ED to confirm, also check urinalysis given patient's ongoing dysuria.  EKG shows bifascicular block similar to previous, no acute QRS widening or other EKG changes noted.  Labs show AKI with associated hyperkalemia,  will treat with high-dose albuterol , insulin , and Lokelma .  No significant anemia or leukocytosis noted, LFTs are unremarkable.  Case discussed with hospitalist for admission.      FINAL CLINICAL IMPRESSION(S) / ED DIAGNOSES   Final diagnoses:  Hyperkalemia  AKI (acute kidney injury) (HCC)     Rx / DC Orders   ED Discharge Orders     None        Note:  This document was prepared using Dragon voice recognition software and may include unintentional dictation errors.   Twilla Galea, MD 01/28/24 [phone]

## 2024-01-28 NOTE — H&P (Signed)
 History and Physical    Cassandra [name] ZOX:[id number] DOB: [birth year] DOA: 01/28/2024  Referring MD/NP/PA:   PCP: Yehuda Helms, MD   Patient coming from:  The patient is coming from home.     Chief Complaint: Abnormal lab with hyperkalemia  HPI: Cassandra [name] is a 89 y.o. female with medical history significant of CKD-3B, HTN, HLD, DM, COPD, PVD, CAD, PPM due to SSS, sCHF with EF 45-50%, hypothyroidism, depression with anxiety, PAF on Eliquis , SAH, s/p of left BKA, chronic mesenteric ischemia, lymphedema, vitamin B12 deficiency, breast cancer (s/p of chemotherapy), RLS, dysphagia, who presents with abnormal lab with hyperkalemia.  Patient reports that she had follow-up with her PCP today following admission last month for AKI and hyperkalemia. She had labs drawn. She received a phone call shortly after the visit that her potassium was elevated up to 7.0. She was sent to ED for further evaluation treatment.  Patient does not have chest pain, cough, SOB.  No nausea, vomiting, diarrhea or abdominal pain.  She states that she completed course of antibiotics for UTI yesterday, but still has some dysuria and burning on urination.  No hematuria.  No fever or chills.   Data reviewed independently and ED Course: pt was found to have K 6.7, worsening renal function with creatinine 1.77, BUN 38 and GFR 27 (recent baseline creatinine 1.1-1.4), temperature normal, blood pressure 107/79, heart rate 71, RR 17, oxygen saturation 93% on room air.  Patient is placed in telemetry bed for observation.   EKG: I have personally reviewed.  Sinus rhythm, QTc 461, bifascicular block, poor R wave progression   Review of Systems:   General: no fevers, chills, no body weight gain, fatigue HEENT: no blurry vision, hearing changes or sore throat Respiratory: no dyspnea, coughing, wheezing CV: no chest pain, no palpitations GI: no nausea, vomiting, abdominal pain, diarrhea, constipation GU: has  dysuria, burning on urination, no increased urinary frequency, hematuria  Ext: no leg edema Neuro: no unilateral weakness, numbness, or tingling, no vision change or hearing loss Skin: no rash, no skin tear. MSK: No muscle spasm, no deformity, no limitation of range of movement in spin Heme: No easy bruising.  Travel history: No recent long distant travel.   Allergy:  Allergies  Allergen Reactions   Baclofen Other (See Comments)    Hallucinations   Levofloxacin Other (See Comments)    Hallucinations     Past Medical History:  Diagnosis Date   Acquired hypothyroidism 01/06/2014   Anxiety    Arthritis    B12 deficiency 02/15/2014   Benign essential hypertension 01/06/2014   Breast cancer (HCC) 1982   Right breast cancer - chemotherapy   CAD (coronary artery disease), native coronary artery 01/06/2014   Carotid artery calcification    Chronic airway obstruction, not elsewhere classified 01/06/2014   Chronic mesenteric ischemia (HCC)    s/p SMA stent 11/03/19   Depression    Diabetes mellitus without complication (HCC)    Dysrhythmia    aflutter with RVR 06/2016   History of kidney stones 2019   left ureteral stone   Incontinence of urine    Myocardial infarction (HCC) 1982   small infarct   Neuromuscular disorder (HCC)    restless legs   Pacemaker    Presence of permanent cardiac pacemaker 07/2016   Pure hypercholesterolemia 01/06/2014   Renal insufficiency    Skin cancer    Sleep apnea    no longer uses a cpap. Uses oxygen as needed  Past Surgical History:  Procedure Laterality Date   AMPUTATION Left 01/03/2023   Procedure: AMPUTATION BELOW KNEE;  Surgeon: Laneta Pintos, MD;  Location: [facility];  Service: Orthopedics;  Laterality: Left;   ANKLE FUSION Left 10/10/2021   Procedure: ANKLE FUSION;  Surgeon: Laneta Pintos, MD;  Location: [facility];  Service: Orthopedics;  Laterality: Left;   APPENDECTOMY     APPLICATION OF WOUND VAC Left 02/24/2021   Procedure:  APPLICATION OF WOUND VAC;  Surgeon: Adonica Hoose, MD;  Location: [facility];  Service: Orthopedics;  Laterality: Left;   AUGMENTATION MAMMAPLASTY Bilateral 1982/redo in 2014   prior mastectomy   CARDIAC CATHETERIZATION N/A 04/23/2016   Procedure: Left Heart Cath and Coronary Angiography;  Surgeon: [name] Aid, MD;  Location: [facility];  Service: Cardiovascular;  Laterality: N/A;   CARDIAC CATHETERIZATION Left 04/23/2016   Procedure: Coronary Stent Intervention;  Surgeon: Antonette Batters, MD;  Location: [facility];  Service: Cardiovascular;  Laterality: Left;   CAROTID ARTERY ANGIOPLASTY Left 2010   had stent inserted and removed d/t infection. artery from leg inserted in left carotid   CAROTID ENDARTERECTOMY Left 2010   left CEA ~ 2010, s/p excision infected left carotid patch & pseudoaneurysm with left CCA-ICA bypass using SVG 05/20/12   CHOLECYSTECTOMY     CORONARY ANGIOPLASTY WITH STENT PLACEMENT  september 9th 2017   CYSTOSCOPY W/ URETERAL STENT PLACEMENT Left 08/17/2017   Procedure: CYSTOSCOPY WITH RETROGRADE PYELOGRAM/URETERAL STENT PLACEMENT;  Surgeon: Christina Coyer, MD;  Location: [facility];  Service: Urology;  Laterality: Left;   CYSTOSCOPY W/ URETERAL STENT PLACEMENT Left 09/16/2017   Procedure: CYSTOSCOPY WITH STENT REPLACEMENT;  Surgeon: Geraline Knapp, MD;  Location: [facility];  Service: Urology;  Laterality: Left;   CYSTOSCOPY/RETROGRADE/URETEROSCOPY/STONE EXTRACTION WITH BASKET Left 09/16/2017   Procedure: CYSTOSCOPY/RETROGRADE/URETEROSCOPY/STONE EXTRACTION WITH BASKET;  Surgeon: Geraline Knapp, MD;  Location: [facility];  Service: Urology;  Laterality: Left;   EXTERNAL FIXATION LEG Left 02/24/2021   Procedure: EXTERNAL FIXATION  ANKLE;  Surgeon: Adonica Hoose, MD;  [facility];  Service: Orthopedics;  Laterality: Left;   EXTERNAL FIXATION REMOVAL Left 02/26/2021   Procedure: REMOVAL EXTERNAL FIXATION LEG;  Surgeon: Laneta Pintos, MD;   [facility];  Service: Orthopedics;  Laterality: Left;   EYE SURGERY Bilateral    cataract extraction   I & D EXTREMITY Left 02/24/2021   Procedure: IRRIGATION AND DEBRIDEMENT EXTREMITY;  Surgeon: Adonica Hoose, MD;  [facility];  Service: Orthopedics;  Laterality: Left;   I & D EXTREMITY Left 12/21/2021   Procedure: IRRIGATION AND DEBRIDEMENT LEFT ANKLE;  Surgeon: Laneta Pintos, MD;  [facility];  Service: Orthopedics;  Laterality: Left;   I & D EXTREMITY Left 05/03/2022   Procedure: DEBRIDEMENT OF LEFT ANKLE INFECTION;  Surgeon: Laneta Pintos, MD;  [facility];  Service: Orthopedics;  Laterality: Left;   IR FLUORO GUIDE CV LINE LEFT  12/27/2021   IR FLUORO GUIDE CV LINE RIGHT  10/12/2021   IR REMOVAL TUN CV CATH W/O FL  11/22/2021   IR REMOVAL TUN CV CATH W/O FL  10/10/2022   IR US  GUIDE VASC ACCESS LEFT  12/27/2021   IR US  GUIDE VASC ACCESS RIGHT  10/12/2021   KYPHOPLASTY N/A 02/03/2018   Procedure: [id number];  Surgeon: Molli Angelucci, MD;  Location: [facility];  Service: Orthopedics;  Laterality: N/A;   MASTECTOMY Right 1982   ORIF ANKLE FRACTURE Left 02/26/2021   Procedure: OPEN  REDUCTION INTERNAL FIXATION (ORIF) ANKLE FRACTURE;  Surgeon: Laneta Pintos, MD;  [facility]: [facility];  Service: Orthopedics;  Laterality: Left;   PACEMAKER INSERTION Left 07/03/2016   Procedure: INSERTION PACEMAKER;  Surgeon: Percival Brace, MD;  Location: [facility];  Service: Cardiovascular;  Laterality: Left;   SKIN CANCER EXCISION     TONSILLECTOMY     TUBAL LIGATION     VISCERAL ANGIOGRAPHY N/A 11/03/2019   Procedure: VISCERAL ANGIOGRAPHY;  Surgeon: Jackquelyn Mass, MD;  Location: [facility];  Service: Cardiovascular;  Laterality: N/A;    Social History:  reports that she quit smoking about 69 years ago. Her smoking use included cigarettes. She started smoking about 70 years ago. She has a 0.5 pack-year smoking history. She has been exposed to tobacco smoke. She has never  used smokeless tobacco. She reports current alcohol use. She reports that she does not currently use drugs.  Family History:  Family History  Problem Relation Age of Onset   Prostate cancer Neg Hx    Kidney cancer Neg Hx    Breast cancer Neg Hx      Prior to Admission medications   Medication Sig Start Date End Date Taking? Authorizing Provider  acetaminophen  (TYLENOL ) 325 MG tablet Take 1-2 tablets (325-650 mg total) by mouth every 6 (six) hours as needed for mild pain (pain score 1-3 or temp > 100.5). 01/27/23   Angiulli, Everlyn Hockey, PA-C  amitriptyline  (ELAVIL ) 10 MG tablet Take 1 tablet by mouth at bedtime. 03/10/23   [provider]  apixaban  (ELIQUIS ) 2.5 MG TABS tablet Take 1 tablet (2.5 mg total) by mouth 2 (two) times daily. 01/28/23   Angiulli, Everlyn Hockey, PA-C  clopidogrel  (PLAVIX ) 75 MG tablet Take 1 tablet (75 mg total) by mouth daily. 01/28/23   Angiulli, Everlyn Hockey, PA-C  docusate sodium  (COLACE) 100 MG capsule Take 1 capsule (100 mg total) by mouth 2 (two) times daily. 01/28/23   Angiulli, Everlyn Hockey, PA-C  gabapentin  (NEURONTIN ) 100 MG capsule Take 1 capsule (100 mg total) by mouth 2 (two) times daily as needed (severe phantom limb pain). Patient taking differently: Take 100 mg by mouth daily as needed (severe phantom limb pain). 01/30/23   Angiulli, Everlyn Hockey, PA-C  gabapentin  (NEURONTIN ) 100 MG capsule Take 1 capsule by mouth 3 (three) times daily. 02/26/23   [provider]  hydrOXYzine  (ATARAX ) 10 MG tablet Take 1 tablet (10 mg total) by mouth 3 (three) times daily as needed. 07/01/23   Kuppelweiser, Cassie L, RPH-CPP  levothyroxine  (SYNTHROID ) 88 MCG tablet Take 1 tablet (88 mcg total) by mouth daily before breakfast. 12/15/23 12/14/24  Wouk, Haynes Lips, MD  Melatonin 10 MG CAPS Take 10 mg by mouth at bedtime as needed (sleep).    [provider]  metFORMIN  (GLUCOPHAGE ) 500 MG tablet Take 1 tablet (500 mg total) by mouth 2 (two) times daily with a meal. 01/28/23    Angiulli, Everlyn Hockey, PA-C  methocarbamol  (ROBAXIN ) 500 MG tablet Take 1 tablet (500 mg total) by mouth every 6 (six) hours as needed for muscle spasms. 01/28/23   Angiulli, Everlyn Hockey, PA-C  metoprolol  tartrate (LOPRESSOR ) 25 MG tablet Take 1 tablet (25 mg total) by mouth 2 (two) times daily. 08/10/22 12/25/23  Verlyn Goad, MD  Multiple Vitamin (MULTIVITAMIN WITH MINERALS) TABS tablet Take 1 tablet by mouth daily. 09/24/17   Brenna Cam, MD  nitroGLYCERIN  (NITROSTAT ) 0.4 MG SL tablet Place 1 tablet (0.4 mg total) under the tongue every  5 (five) minutes as needed for chest pain. 04/24/16   Altha Athens, MD  pantoprazole  (PROTONIX ) 40 MG tablet Take 40 mg by mouth daily.    [provider]  pregabalin  (LYRICA ) 25 MG capsule Take 1 capsule (25 mg total) by mouth at bedtime. 01/28/23   Angiulli, Everlyn Hockey, PA-C  rosuvastatin  (CRESTOR ) 10 MG tablet Take 10 mg by mouth at bedtime. 09/04/22   [provider]  telmisartan (MICARDIS) 40 MG tablet Take 40 mg by mouth daily.    [provider]  torsemide  (DEMADEX ) 20 MG tablet Take 1 tablet (20 mg total) by mouth daily. 01/30/23   Angiulli, Everlyn Hockey, PA-C  Vibegron  (GEMTESA ) 75 MG TABS Take 1 tablet (75 mg total) by mouth daily. 10/20/23   Vaillancourt, Samantha, PA-C  VITAMIN D  PO Take 1 capsule by mouth daily.    [provider]  Vitamin D , Ergocalciferol , (DRISDOL ) 1.25 MG (50000 UNIT) CAPS capsule Take 1 capsule (50,000 Units total) by mouth every 7 (seven) days. Once a week on Thursdays 02/06/23   Zelda Hickman, [place]    Physical Exam: Vitals:   01/28/24 1727 01/28/24 1729 01/28/24 2106  BP:  107/79 (!) 164/49  Pulse:  71 72  Resp:  17 16  Temp:  97.8 F (36.6 C) (!) 96.8 F (36 C)  TempSrc:  Oral   SpO2:  93% 95%  Weight: 60.8 kg    Height: 4\' 11"  (1.499 m)     General: Not in acute distress HEENT:       Eyes: PERRL, EOMI, no jaundice       ENT: No discharge from the ears and nose, no pharynx injection, no tonsillar  enlargement.        Neck: No JVD, no bruit, no mass felt. Heme: No neck lymph node enlargement. Cardiac: S1/S2, RRR, No murmurs, No gallops or rubs. Respiratory: No rales, wheezing, rhonchi or rubs. GI: Soft, nondistended, nontender, no rebound pain, no organomegaly, BS present. GU: No hematuria Ext: No pitting leg edema bilaterally. S/p of left BKA Musculoskeletal: No joint deformities, No joint redness or warmth, no limitation of ROM in spin. Skin: No rashes.  Neuro: Alert, oriented X3, cranial nerves II-XII grossly intact, moves all extremities normally.  Psych: Patient is not psychotic, no suicidal or hemocidal ideation.  Labs on Admission: I have personally reviewed following labs and imaging studies  CBC: Recent Labs  Lab 01/28/24 1753  WBC 10.3  HGB 10.3*  HCT 33.0*  MCV 75.0*  PLT 281   Basic Metabolic Panel: Recent Labs  Lab 01/28/24 1753  NA 135  K 6.7*  CL 108  CO2 20*  GLUCOSE 214*  BUN 38*  CREATININE 1.77*  CALCIUM  9.1   GFR: Estimated Creatinine Clearance: 17.1 mL/min (A) (by C-G formula based on SCr of 1.77 mg/dL (H)). Liver Function Tests: Recent Labs  Lab 01/28/24 1753  AST 16  ALT 12  ALKPHOS 83  BILITOT 0.4  PROT 7.5  ALBUMIN  3.2*   No results for input(s): "LIPASE", "AMYLASE" in the last 168 hours. No results for input(s): "AMMONIA" in the last 168 hours. Coagulation Profile: No results for input(s): "INR", "PROTIME" in the last 168 hours. Cardiac Enzymes: No results for input(s): "CKTOTAL", "CKMB", "CKMBINDEX", "TROPONINI" in the last 168 hours. BNP (last 3 results) No results for input(s): "PROBNP" in the last 8760 hours. HbA1C: No results for input(s): "HGBA1C" in the last 72 hours. CBG: No results for input(s): "GLUCAP" in the last 168 hours.  Lipid Profile: No results for input(s): "CHOL", "HDL", "LDLCALC", "TRIG", "CHOLHDL", "LDLDIRECT" in the last 72 hours. Thyroid  Function Tests: No results for input(s): "TSH", "T4TOTAL",  "FREET4", "T3FREE", "THYROIDAB" in the last 72 hours. Anemia Panel: No results for input(s): "VITAMINB12", "FOLATE", "FERRITIN", "TIBC", "IRON ", "RETICCTPCT" in the last 72 hours. Urine analysis:    Component Value Date/Time   COLORURINE YELLOW (A) 12/09/2023 0345   APPEARANCEUR HAZY (A) 12/09/2023 0345   APPEARANCEUR Cloudy (A) 10/20/2023 1434   LABSPEC 1.018 12/09/2023 0345   LABSPEC 1.008 05/11/2012 1400   PHURINE 5.0 12/09/2023 0345   GLUCOSEU 50 (A) 12/09/2023 0345   GLUCOSEU Negative 05/11/2012 1400   HGBUR NEGATIVE 12/09/2023 0345   BILIRUBINUR NEGATIVE 12/09/2023 0345   BILIRUBINUR Negative 10/20/2023 1434   BILIRUBINUR Negative 05/11/2012 1400   KETONESUR NEGATIVE 12/09/2023 0345   PROTEINUR 30 (A) 12/09/2023 0345   NITRITE POSITIVE (A) 12/09/2023 0345   LEUKOCYTESUR LARGE (A) 12/09/2023 0345   LEUKOCYTESUR Trace 05/11/2012 1400   Sepsis Labs: @LABRCNTIP (procalcitonin:4,lacticidven:4) )No results found for this or any previous visit (from the past 240 hours).   Radiological Exams on Admission:   Assessment/Plan Principal Problem:   Hyperkalemia Active Problems:   Chronic combined systolic and diastolic heart failure (HCC)   CAD S/P percutaneous coronary angioplasty   Acute renal failure superimposed on stage 3b chronic kidney disease (HCC)   COPD (chronic obstructive pulmonary disease) (HCC)   Essential hypertension   HLD (hyperlipidemia)   PAF (paroxysmal atrial fibrillation) (HCC)   Type II diabetes mellitus with renal manifestations (HCC)   Hypothyroidism   PVD (peripheral vascular disease) (HCC)   Depression with anxiety   Overweight (BMI 25.0-29.9)   Assessment and Plan:  Hyperkalemia: Potassium 6.7, no T wave peaking on EKG. - Place in telemetry bed for observation - Patient was treated with: Albuterol  nebulizer, NovoLog  10 units, D50, calcium  gluconate 1 g, 10 g of Lokelma  -IVF: 500 cc of NS in ED  - will hold Micardis --> probably need  permanently discontinue Micardis - will repeat K at 10:00PM  Chronic combined systolic and diastolic heart failure (HCC): 2D echo on 08/20/2022 showed EF of 45 to 50% with grade 2 diastolic dysfunction.  Patient does not have SOB, no leg edema.  CHF is compensated. -Hold torsemide  due to worsening renal function  CAD S/P percutaneous coronary angioplasty: No chest pain - Crestor , Plavix , metoprolol   Acute renal failure superimposed on stage 3b chronic kidney disease (HCC): Likely due to dehydration and continuation of torsemide , Micardis -Hold torsemide  and Micardis - IV fluids above  COPD (chronic obstructive pulmonary disease) (HCC): Stable -Bronchodilators and as needed Mucinex   Essential hypertension: Blood pressure 107/79 -IV hydralazine  as needed - Metoprolol  - Hold Micardis and torsemide  as above  HLD (hyperlipidemia) -Crestor   PAF (paroxysmal atrial fibrillation) (HCC): Heart rate 70s - Eliquis  and metoprolol   Type II diabetes mellitus with renal manifestations (HCC): Recent A1c 12.5, poorly controlled.  Patient is taking metformin  at home -Sliding scale insulin   Hypothyroidism -Synthroid   PVD (peripheral vascular disease) (HCC) -Plavix , Crestor   Depression with anxiety -Continue home medications  Overweight (BMI 25.0-29.9): Body weight 60.8 kg, BMI 27.06 - Encourage losing weight - Exercise healthy diet       DVT ppx: on Eliquis   Code Status: Full code   Family Communication:     not done, no family member is at bed side.     Disposition Plan:  Anticipate discharge back to previous environment  Consults called:  none  Admission  status and Level of care: Telemetry Medical:    for obs     Dispo: The patient is from: Home              Anticipated d/c is to: Home              Anticipated d/c date is: 1 day              Patient currently is not medically stable to d/c.    Severity of Illness:  The appropriate patient status for this patient  is OBSERVATION. Observation status is judged to be reasonable and necessary in order to provide the required intensity of service to ensure the patient's safety. The patient's presenting symptoms, physical exam findings, and initial radiographic and laboratory data in the context of their medical condition is felt to place them at decreased risk for further clinical deterioration. Furthermore, it is anticipated that the patient will be medically stable for discharge from the hospital within 2 midnights of admission.        Date of Service 01/28/2024    Fidencio Hue [facility]   If 7PM-7AM, please contact night-coverage [email or web address] 01/28/2024, 10:38 PM

## 2024-01-29 ENCOUNTER — Ambulatory Visit: Payer: Medicare Other | Admitting: Physical Therapy

## 2024-01-29 DIAGNOSIS — J439 Emphysema, unspecified: Secondary | ICD-10-CM

## 2024-01-29 DIAGNOSIS — E1122 Type 2 diabetes mellitus with diabetic chronic kidney disease: Secondary | ICD-10-CM

## 2024-01-29 DIAGNOSIS — E875 Hyperkalemia: Secondary | ICD-10-CM | POA: Diagnosis not present

## 2024-01-29 DIAGNOSIS — N1831 Chronic kidney disease, stage 3a: Secondary | ICD-10-CM

## 2024-01-29 DIAGNOSIS — N179 Acute kidney failure, unspecified: Secondary | ICD-10-CM | POA: Diagnosis not present

## 2024-01-29 DIAGNOSIS — I2510 Atherosclerotic heart disease of native coronary artery without angina pectoris: Secondary | ICD-10-CM | POA: Diagnosis not present

## 2024-01-29 DIAGNOSIS — I5042 Chronic combined systolic (congestive) and diastolic (congestive) heart failure: Secondary | ICD-10-CM | POA: Diagnosis not present

## 2024-01-29 LAB — CBC
HCT: 30.7 % — ABNORMAL LOW (ref 36.0–46.0)
Hemoglobin: 9.6 g/dL — ABNORMAL LOW (ref 12.0–15.0)
MCH: 23.1 pg — ABNORMAL LOW (ref 26.0–34.0)
MCHC: 31.3 g/dL (ref 30.0–36.0)
MCV: 74 fL — ABNORMAL LOW (ref 80.0–100.0)
Platelets: 234 10*3/uL (ref 150–400)
RBC: 4.15 MIL/uL (ref 3.87–5.11)
RDW: 18.8 % — ABNORMAL HIGH (ref 11.5–15.5)
WBC: 11.5 10*3/uL — ABNORMAL HIGH (ref 4.0–10.5)
nRBC: 0 % (ref 0.0–0.2)

## 2024-01-29 LAB — BASIC METABOLIC PANEL WITH GFR
Anion gap: 7 (ref 5–15)
BUN: 36 mg/dL — ABNORMAL HIGH (ref 8–23)
CO2: 21 mmol/L — ABNORMAL LOW (ref 22–32)
Calcium: 9.1 mg/dL (ref 8.9–10.3)
Chloride: 108 mmol/L (ref 98–111)
Creatinine, Ser: 1.58 mg/dL — ABNORMAL HIGH (ref 0.44–1.00)
GFR, Estimated: 31 mL/min — ABNORMAL LOW (ref >60)
Glucose, Bld: 273 mg/dL — ABNORMAL HIGH (ref 70–99)
Potassium: 5.7 mmol/L — ABNORMAL HIGH (ref 3.5–5.1)
Sodium: 136 mmol/L (ref 135–145)

## 2024-01-29 LAB — GLUCOSE, CAPILLARY
Glucose-Capillary: 242 mg/dL — ABNORMAL HIGH (ref 70–99)
Glucose-Capillary: 192 mg/dL — ABNORMAL HIGH (ref 70–99)
Glucose-Capillary: 268 mg/dL — ABNORMAL HIGH (ref 70–99)

## 2024-01-29 LAB — URINALYSIS, ROUTINE W REFLEX MICROSCOPIC
Bilirubin Urine: NEGATIVE
Glucose, UA: 150 mg/dL — AB
Hgb urine dipstick: NEGATIVE
Ketones, ur: NEGATIVE mg/dL
Leukocytes,Ua: NEGATIVE
Nitrite: NEGATIVE
Protein, ur: NEGATIVE mg/dL
Specific Gravity, Urine: 1.014 (ref 1.005–1.030)
pH: 5 (ref 5.0–8.0)

## 2024-01-29 LAB — POTASSIUM: Potassium: 6 mmol/L — ABNORMAL HIGH (ref 3.5–5.1)

## 2024-01-29 MED ORDER — PATIROMER SORBITEX CALCIUM 8.4 G PO PACK
16.8000 g | PACK | Freq: Every day | ORAL | Status: DC
  Administered 2024-01-29 – 2024-01-30 (×2): 16.8 g via ORAL
  Filled 2024-01-29 (×3): qty 2

## 2024-01-29 MED ORDER — PATIROMER SORBITEX CALCIUM 8.4 G PO PACK
8.4000 g | PACK | Freq: Every day | ORAL | Status: DC
  Filled 2024-01-29: qty 1

## 2024-01-29 MED ORDER — FE FUM-VIT C-VIT B12-FA 460-60-0.01-1 MG PO CAPS
1.0000 | ORAL_CAPSULE | Freq: Every day | ORAL | Status: DC
  Administered 2024-01-29 – 2024-01-30 (×2): 1 via ORAL
  Filled 2024-01-29 (×2): qty 1

## 2024-01-29 MED ORDER — SODIUM CHLORIDE 0.9 % IV SOLN: INTRAVENOUS | Status: DC

## 2024-01-29 MED ORDER — SODIUM ZIRCONIUM CYCLOSILICATE 10 G PO PACK
10.0000 g | PACK | Freq: Every day | ORAL | Status: DC
  Administered 2024-01-29: 10 g via ORAL
  Filled 2024-01-29 (×2): qty 1

## 2024-01-29 MED ORDER — POLYETHYLENE GLYCOL 3350 17 G PO PACK
17.0000 g | PACK | Freq: Every day | ORAL | Status: DC
  Administered 2024-01-29 – 2024-01-30 (×2): 17 g via ORAL
  Filled 2024-01-29: qty 1

## 2024-01-29 MED ORDER — INSULIN ASPART 100 UNIT/ML IJ SOLN
0.0000 [IU] | Freq: Every day | INTRAMUSCULAR | Status: DC
  Administered 2024-01-29: 3 [IU] via SUBCUTANEOUS
  Filled 2024-01-29: qty 1

## 2024-01-29 MED ORDER — INSULIN ASPART 100 UNIT/ML IJ SOLN
0.0000 [IU] | Freq: Three times a day (TID) | INTRAMUSCULAR | Status: DC
  Administered 2024-01-29: 2 [IU] via SUBCUTANEOUS
  Administered 2024-01-29: 3 [IU] via SUBCUTANEOUS
  Administered 2024-01-30: 5 [IU] via SUBCUTANEOUS
  Filled 2024-01-29 (×3): qty 1

## 2024-01-29 NOTE — Assessment & Plan Note (Addendum)
 Persistent hyperkalemia, no EKG changes. Received albuterol , NovoLog , calcium  gluconate and Lokelma  before.  Potassium at 5.7 this morning and 6 on repeat. - Continue with Lokelma  -Add Veltassa -Monitor potassium -Giving some IV fluid -Telmisartan was discontinued and should not be restarted on discharge

## 2024-01-29 NOTE — Assessment & Plan Note (Signed)
-  Continue Plavix and Crestor 

## 2024-01-29 NOTE — Plan of Care (Signed)
  Problem: Health Behavior/Discharge Planning: Goal: Ability to manage health-related needs will improve Outcome: Progressing   Problem: Clinical Measurements: Goal: Diagnostic test results will improve Outcome: Progressing   Problem: Nutrition: Goal: Adequate nutrition will be maintained Outcome: Progressing   Problem: Elimination: Goal: Will not experience complications related to urinary retention Outcome: Progressing

## 2024-01-29 NOTE — Hospital Course (Addendum)
 Taken in H&P.  HAIZEL GATCHELL is a 89 y.o. female with medical history significant of CKD-3B, HTN, HLD, DM, COPD, PVD, CAD, PPM due to SSS, sCHF with EF 45-50%, hypothyroidism, depression with anxiety, PAF on Eliquis , SAH, s/p of left BKA, chronic mesenteric ischemia, lymphedema, vitamin B12 deficiency, breast cancer (s/p of chemotherapy), RLS, dysphagia, who presents with abnormal lab with hyperkalemia.   Patient had a recent hospitalization for AKI and hyperkalemia and went to see PCP for follow-up and found to have potassium of 7 so she was sent to ED.  On presentation stable vitals, labs with potassium of 6.7, AKI with creatinine at 1.77, BUN 38, recent baseline of 1.1-1.4.  BNP 257 EKG with NSR, QTc 461, bifascicular block and poor R wave progression.  No peaked T waves on EKG.  Patient received albuterol  nebulizer, NovoLog , calcium  gluconate and Lokelma  along with IV fluid in ED.  Home telmisartan was discontinued.  5/29: Vital stable, labs with potassium of 5.7, bicarb 21, blood glucose 273 some improvement to creatinine now at 1.58.  CBC with mild leukocytosis at 11.5, hemoglobin 9.6.  Added Lokelma  with Veltassa. Repeat potassium at 6.  Ordered some more IV fluid.

## 2024-01-29 NOTE — Assessment & Plan Note (Signed)
 Patient is overweight.  -Exercise and healthy diet

## 2024-01-29 NOTE — Assessment & Plan Note (Signed)
 Likely due to dehydration and continuation of torsemide  and Micardis.  Slowly improving renal function after getting 500 cc of IV fluid.  Still above baseline - Micardis was discontinued -Holding torsemide  for another day -Giving some IV fluid - Monitor renal function

## 2024-01-29 NOTE — Plan of Care (Signed)

## 2024-01-29 NOTE — Assessment & Plan Note (Signed)
 Continue home meds

## 2024-01-29 NOTE — Assessment & Plan Note (Signed)
 Uncontrolled with hyperglycemia and recent A1c of 12.5. Patient was just taking metformin  at home -SSI

## 2024-01-29 NOTE — Assessment & Plan Note (Signed)
 2D echo on 08/20/2022 showed EF of 45 to 50% with grade 2 diastolic dysfunction.  Patient does not have SOB, no leg edema.  CHF is compensated. -Hold torsemide  due to worsening renal function -Monitor volume status closely as giving some IV fluid

## 2024-01-29 NOTE — Assessment & Plan Note (Signed)
 Seems stable. -Continue with bronchodilators as needed

## 2024-01-29 NOTE — Assessment & Plan Note (Signed)
 No chest pain. -Continue home Crestor , Plavix  and metoprolol 

## 2024-01-29 NOTE — Care Management Obs Status (Signed)
 MEDICARE OBSERVATION STATUS NOTIFICATION   Patient Details  Name: Cassandra [name] MRN: [id number] Date of Birth: 28-Aug-1933   Medicare Observation Status Notification Given:  Yes    Lamario Mani W, CMA 01/29/2024, 11:04 AM

## 2024-01-29 NOTE — Assessment & Plan Note (Signed)
 Continue home Synthroid

## 2024-01-29 NOTE — Progress Notes (Signed)
 Progress Note   Patient: Cassandra [name] MVH:[id number] DOB: 26-Apr-1933 DOA: 01/28/2024     0 DOS: the patient was seen and examined on 01/29/2024   Brief hospital course: Taken in H&P.  Cassandra [name] is a 89 y.o. female with medical history significant of CKD-3B, HTN, HLD, DM, COPD, PVD, CAD, PPM due to SSS, sCHF with EF 45-50%, hypothyroidism, depression with anxiety, PAF on Eliquis , SAH, s/p of left BKA, chronic mesenteric ischemia, lymphedema, vitamin B12 deficiency, breast cancer (s/p of chemotherapy), RLS, dysphagia, who presents with abnormal lab with hyperkalemia.   Patient had a recent hospitalization for AKI and hyperkalemia and went to see PCP for follow-up and found to have potassium of 7 so she was sent to ED.  On presentation stable vitals, labs with potassium of 6.7, AKI with creatinine at 1.77, BUN 38, recent baseline of 1.1-1.4.  BNP 257 EKG with NSR, QTc 461, bifascicular block and poor R wave progression.  No peaked T waves on EKG.  Patient received albuterol  nebulizer, NovoLog , calcium  gluconate and Lokelma  along with IV fluid in ED.  Home telmisartan was discontinued.  5/29: Vital stable, labs with potassium of 5.7, bicarb 21, blood glucose 273 some improvement to creatinine now at 1.58.  CBC with mild leukocytosis at 11.5, hemoglobin 9.6.  Added Lokelma  with Veltassa. Repeat potassium at 6.  Ordered some more IV fluid.  Assessment and Plan: * Hyperkalemia Persistent hyperkalemia, no EKG changes. Received albuterol , NovoLog , calcium  gluconate and Lokelma  before.  Potassium at 5.7 this morning and 6 on repeat. - Continue with Lokelma  -Add Veltassa -Monitor potassium -Giving some IV fluid -Telmisartan was discontinued and should not be restarted on discharge  Chronic combined systolic and diastolic heart failure (HCC)  2D echo on 08/20/2022 showed EF of 45 to 50% with grade 2 diastolic dysfunction.  Patient does not have SOB, no leg edema.  CHF is  compensated. -Hold torsemide  due to worsening renal function -Monitor volume status closely as giving some IV fluid  CAD S/P percutaneous coronary angioplasty No chest pain. -Continue home Crestor , Plavix  and metoprolol   Acute renal failure superimposed on stage 3b chronic kidney disease (HCC) Likely due to dehydration and continuation of torsemide  and Micardis.  Slowly improving renal function after getting 500 cc of IV fluid.  Still above baseline - Micardis was discontinued -Holding torsemide  for another day -Giving some IV fluid - Monitor renal function  COPD (chronic obstructive pulmonary disease) (HCC) Seems stable. -Continue with bronchodilators as needed  Essential hypertension Blood pressure currently within goal -Discontinue home Micardis-we can add amlodipine  if needed -Holding torsemide  -As needed hydralazine   PAF (paroxysmal atrial fibrillation) (HCC) Seems stable -Continue home Eliquis  and metoprolol   PVD (peripheral vascular disease) (HCC) - Continue Plavix  and Crestor   Type II diabetes mellitus with renal manifestations (HCC) Uncontrolled with hyperglycemia and recent A1c of 12.5. Patient was just taking metformin  at home -SSI  HLD (hyperlipidemia) - Continue home Crestor   Hypothyroidism - Continue home Synthroid   Depression with anxiety - Continue home meds  Overweight (BMI 25.0-29.9) Patient is overweight.  -Exercise and healthy diet   Subjective: Patient was seen and examined today.  Denies any pain or shortness of breath.  She wants to go home.  Husband at bedside.  Physical Exam: Vitals:   01/28/24 2345 01/29/24 0341 01/29/24 0835 01/29/24 1121  BP: (!) 90/40 (!) 127/49 (!) 143/92 (!) 143/81  Pulse: 81 71 67 62  Resp: 16 16 19 18   Temp: 97.9 F (36.6 C) 98.1  F (36.7 C) 98.1 F (36.7 C) 98.1 F (36.7 C)  TempSrc:      SpO2: 100% 96% (!) 84% 93%  Weight:      Height:       General.  Hard of hearing elderly lady, in no acute  distress. Pulmonary.  Lungs clear bilaterally, normal respiratory effort. CV.  Regular rate and rhythm, no JVD, rub or murmur. Abdomen.  Soft, nontender, nondistended, BS positive. CNS.  Alert and oriented .  No focal neurologic deficit. Extremities.  No edema, no cyanosis, pulses intact and symmetrical. Psychiatry.  Judgment and insight appears normal.   Data Reviewed: Prior data reviewed  Family Communication: Discussed with husband at bedside  Disposition: Status is: Observation The patient will require care spanning > 2 midnights and should be moved to inpatient because: Persistent hyperkalemia  Planned Discharge Destination: Home  DVT prophylaxis.  Eliquis  Time spent: 50 minutes  This record has been created using Conservation officer, historic buildings. Errors have been sought and corrected,but may not always be located. Such creation errors do not reflect on the standard of care.   Author: Luna Salinas, MD 01/29/2024 3:32 PM  For on call review [email or web address].

## 2024-01-29 NOTE — Assessment & Plan Note (Signed)
 Seems stable -Continue home Eliquis  and metoprolol 

## 2024-01-29 NOTE — Assessment & Plan Note (Signed)
 Blood pressure currently within goal -Discontinue home Micardis-we can add amlodipine  if needed -Holding torsemide  -As needed hydralazine 

## 2024-01-29 NOTE — Assessment & Plan Note (Signed)
 Continue home Crestor

## 2024-01-30 DIAGNOSIS — N179 Acute kidney failure, unspecified: Secondary | ICD-10-CM | POA: Diagnosis not present

## 2024-01-30 DIAGNOSIS — I5042 Chronic combined systolic (congestive) and diastolic (congestive) heart failure: Secondary | ICD-10-CM | POA: Diagnosis not present

## 2024-01-30 DIAGNOSIS — I2510 Atherosclerotic heart disease of native coronary artery without angina pectoris: Secondary | ICD-10-CM | POA: Diagnosis not present

## 2024-01-30 DIAGNOSIS — E875 Hyperkalemia: Secondary | ICD-10-CM | POA: Diagnosis not present

## 2024-01-30 LAB — RENAL FUNCTION PANEL
Albumin: 2.7 g/dL — ABNORMAL LOW (ref 3.5–5.0)
Anion gap: 6 (ref 5–15)
BUN: 33 mg/dL — ABNORMAL HIGH (ref 8–23)
CO2: 20 mmol/L — ABNORMAL LOW (ref 22–32)
Calcium: 9.4 mg/dL (ref 8.9–10.3)
Chloride: 109 mmol/L (ref 98–111)
Creatinine, Ser: 1.49 mg/dL — ABNORMAL HIGH (ref 0.44–1.00)
GFR, Estimated: 33 mL/min — ABNORMAL LOW (ref >60)
Glucose, Bld: 329 mg/dL — ABNORMAL HIGH (ref 70–99)
Phosphorus: 4.6 mg/dL (ref 2.5–4.6)
Potassium: 4.9 mmol/L (ref 3.5–5.1)
Sodium: 135 mmol/L (ref 135–145)

## 2024-01-30 LAB — GLUCOSE, CAPILLARY: Glucose-Capillary: 273 mg/dL — ABNORMAL HIGH (ref 70–99)

## 2024-01-30 MED ORDER — INSULIN ASPART 100 UNIT/ML IJ SOLN: 5.0000 [IU] | Freq: Three times a day (TID) | INTRAMUSCULAR | Status: DC

## 2024-01-30 MED ORDER — ENSURE PLUS HIGH PROTEIN PO LIQD: 237.0000 mL | Freq: Two times a day (BID) | ORAL | Status: DC

## 2024-01-30 MED ORDER — METOPROLOL TARTRATE 25 MG PO TABS: 25.0000 mg | ORAL_TABLET | Freq: Two times a day (BID) | ORAL | 2 refills | Status: AC

## 2024-01-30 MED ORDER — FE FUM-VIT C-VIT B12-FA 460-60-0.01-1 MG PO CAPS: 1.0000 | ORAL_CAPSULE | Freq: Every day | ORAL | 0 refills | Status: AC

## 2024-01-30 MED ORDER — SODIUM BICARBONATE 650 MG PO TABS: 650.0000 mg | ORAL_TABLET | Freq: Two times a day (BID) | ORAL | 1 refills | Status: AC

## 2024-01-30 NOTE — Inpatient Diabetes Management (Signed)
 Inpatient Diabetes Program Recommendations  AACE/ADA: New Consensus Statement on Inpatient Glycemic Control (2015)  Target Ranges:  Prepandial:   less than 140 mg/dL      Peak postprandial:   less than 180 mg/dL (1-2 hours)      Critically ill patients:  140 - 180 mg/dL   Lab Results  Component Value Date   GLUCAP 273 (H) 01/30/2024   HGBA1C 12.5 (H) 12/10/2023    Latest Reference Range & Units 01/28/24 23:47 01/29/24 12:51 01/29/24 16:33 01/29/24 19:59 01/30/24 07:24  Glucose-Capillary 70 - 99 mg/dL 161 (H) 096 (H) 045 (H) 268 (H) 273 (H)  (H): Data is abnormally high  Diabetes history: DM 2 Outpatient Diabetes medications:  Metformin  500 mg bid Current orders for Inpatient glycemic control:  Novolog  0-9 units tid with meals and HS Novolog  5 units tid meal coverage  Inpatient Diabetes Program Recommendations:   Novolog  5 units tid meal coverage just added. May consider: - adding Semglee  8 units daily. -decrease Novolog  meal coverage to 2 units tid if eats 50%  Thank you, Norma Beckers. Darrek Leasure, RN, MSN, CDCES  Diabetes Coordinator Inpatient Glycemic Control Team Team Pager [phone] (8am-5pm) 01/30/2024 10:12 AM

## 2024-01-30 NOTE — Assessment & Plan Note (Signed)
 Persistent hyperkalemia, no EKG changes. Received albuterol , NovoLog , calcium  gluconate and Lokelma  before.  Potassium improved to 4.9 today -Telmisartan was discontinued and should not be restarted on discharge

## 2024-01-30 NOTE — Discharge Summary (Signed)
 Physician Discharge Summary   Patient: Cassandra [name] MRN: [id number] DOB: [birth year]  Admit date:     01/28/2024  Discharge date: 01/30/24  Discharge Physician: Luna Salinas   PCP: Yehuda Helms, MD   Recommendations at discharge:  Please obtain CBC and BMP on follow-up Follow-up with primary care provider within next few days to have potassium rechecked Restart home telmisartan Please avoid drinking Ensure as it has high potassium content  Discharge Diagnoses: Principal Problem:   Hyperkalemia Active Problems:   Chronic combined systolic and diastolic heart failure (HCC)   CAD S/P percutaneous coronary angioplasty   Acute renal failure superimposed on stage 3b chronic kidney disease (HCC)   COPD (chronic obstructive pulmonary disease) (HCC)   Essential hypertension   PAF (paroxysmal atrial fibrillation) (HCC)   PVD (peripheral vascular disease) (HCC)   Type II diabetes mellitus with renal manifestations (HCC)   HLD (hyperlipidemia)   Hypothyroidism   Depression with anxiety   Overweight (BMI 25.0-29.9)   Hospital Course: Taken in H&P.  Cassandra [name] is a 89 y.o. female with medical history significant of CKD-3B, HTN, HLD, DM, COPD, PVD, CAD, PPM due to SSS, sCHF with EF 45-50%, hypothyroidism, depression with anxiety, PAF on Eliquis , SAH, s/p of left BKA, chronic mesenteric ischemia, lymphedema, vitamin B12 deficiency, breast cancer (s/p of chemotherapy), RLS, dysphagia, who presents with abnormal lab with hyperkalemia.   Patient had a recent hospitalization for AKI and hyperkalemia and went to see PCP for follow-up and found to have potassium of 7 so she was sent to ED.  On presentation stable vitals, labs with potassium of 6.7, AKI with creatinine at 1.77, BUN 38, recent baseline of 1.1-1.4.  BNP 257 EKG with NSR, QTc 461, bifascicular block and poor R wave progression.  No peaked T waves on EKG.  Patient received albuterol  nebulizer, NovoLog , calcium   gluconate and Lokelma  along with IV fluid in ED.  Home telmisartan was discontinued.  5/29: Vital stable, labs with potassium of 5.7, bicarb 21, blood glucose 273 some improvement to creatinine now at 1.58.  CBC with mild leukocytosis at 11.5, hemoglobin 9.6.  Added Lokelma  with Veltassa. Repeat potassium at 6.    5/30: Vital stable and potassium improved to 4.9.  CBG elevated.  Patient was apparently recently started on Lantus  and NovoLog .  We added mealtime coverage and she will resume her home regimen on discharge. We discontinued home telmisartan and her PCP can add in different agent if needed.  Blood pressure currently within goal.  She should also avoid high potassium food.  Patient was instructed to hold torsemide  for another day and then resume it.  Renal function slowly improving.  Bicarb borderline low so she was given bicarb tablet and PCP can stop once improved.  Patient will continue on current medications and need to have a close follow-up with her providers for further assistance.  Assessment and Plan: * Hyperkalemia Persistent hyperkalemia, no EKG changes. Received albuterol , NovoLog , calcium  gluconate and Lokelma  before.  Potassium improved to 4.9 today -Telmisartan was discontinued and should not be restarted on discharge  Chronic combined systolic and diastolic heart failure (HCC)  2D echo on 08/20/2022 showed EF of 45 to 50% with grade 2 diastolic dysfunction.  Patient does not have SOB, no leg edema.  CHF is compensated. -Hold torsemide  due to worsening renal function-instructed to resume in 2 days.  CAD S/P percutaneous coronary angioplasty No chest pain. -Continue home Crestor , Plavix  and metoprolol   Acute renal failure superimposed on  stage 3b chronic kidney disease (HCC) Likely due to dehydration and continuation of torsemide  and Micardis.  Slowly improving renal function, did receive some IV fluid - Micardis was discontinued -Holding torsemide  for another  day - Monitor renal function  COPD (chronic obstructive pulmonary disease) (HCC) Seems stable. -Continue with bronchodilators as needed  Essential hypertension Blood pressure currently within goal -Discontinue home Micardis-we can add amlodipine  if needed -Holding torsemide  -As needed hydralazine   PAF (paroxysmal atrial fibrillation) (HCC) Seems stable -Continue home Eliquis  and metoprolol   PVD (peripheral vascular disease) (HCC) - Continue  Crestor   Type II diabetes mellitus with renal manifestations (HCC) Uncontrolled with hyperglycemia and recent A1c of 12.5. -SSI - Will resume home regimen on discharge with a close outpatient follow-up with PCP  HLD (hyperlipidemia) - Continue home Crestor   Hypothyroidism - Continue home Synthroid   Depression with anxiety - Continue home meds  Overweight (BMI 25.0-29.9) Patient is overweight.  -Exercise and healthy diet   Consultants: None Procedures performed: None Disposition: Home Diet recommendation:  Discharge Diet Orders (From admission, onward)     Start     Ordered   01/30/24 0000  Diet - low sodium heart healthy        01/30/24 1023           Cardiac and Carb modified diet DISCHARGE MEDICATION: Allergies as of 01/30/2024       Reactions   Baclofen Other (See Comments)   Hallucinations   Levofloxacin Other (See Comments)   Hallucinations         Medication List     STOP taking these medications    clopidogrel  75 MG tablet Commonly known as: PLAVIX    telmisartan 40 MG tablet Commonly known as: MICARDIS       TAKE these medications    acetaminophen  325 MG tablet Commonly known as: TYLENOL  Take 1-2 tablets (325-650 mg total) by mouth every 6 (six) hours as needed for mild pain (pain score 1-3 or temp > 100.5).   docusate sodium  100 MG capsule Commonly known as: COLACE Take 1 capsule (100 mg total) by mouth 2 (two) times daily.   DULoxetine 30 MG capsule Commonly known as:  CYMBALTA Take 60 mg by mouth daily.   Eliquis  5 MG Tabs tablet Generic drug: apixaban  Take 5 mg by mouth 2 (two) times daily.   Fe Fum-Vit C-Vit B12-FA Caps capsule Commonly known as: TRIGELS-F FORTE Take 1 capsule by mouth daily after breakfast.   gabapentin  100 MG capsule Commonly known as: NEURONTIN  Take 1 capsule by mouth 3 (three) times daily.   hydrOXYzine  10 MG tablet Commonly known as: ATARAX  Take 1 tablet (10 mg total) by mouth 3 (three) times daily as needed.   Lantus  SoloStar 100 UNIT/ML Solostar Pen Generic drug: insulin  glargine Inject 14 Units into the skin at bedtime.   levothyroxine  175 MCG tablet Commonly known as: SYNTHROID  Take 175 mcg by mouth daily.   Melatonin 10 MG Tabs Take 10 mg by mouth at bedtime as needed (Sleep).   metoprolol  tartrate 25 MG tablet Commonly known as: LOPRESSOR  Take 1 tablet (25 mg total) by mouth 2 (two) times daily.   multivitamin with minerals Tabs tablet Take 1 tablet by mouth daily.   nitroGLYCERIN  0.4 MG SL tablet Commonly known as: NITROSTAT  Place 1 tablet (0.4 mg total) under the tongue every 5 (five) minutes as needed for chest pain.   NovoLOG  FlexPen 100 UNIT/ML FlexPen Generic drug: insulin  aspart Inject 4-10 Units into the skin 3 (three) times  daily with meals.   pantoprazole  40 MG tablet Commonly known as: PROTONIX  Take 40 mg by mouth daily.   rosuvastatin  10 MG tablet Commonly known as: CRESTOR  Take 10 mg by mouth at bedtime.   sodium bicarbonate  650 MG tablet Take 1 tablet (650 mg total) by mouth 2 (two) times daily.   torsemide  20 MG tablet Commonly known as: DEMADEX  Take 1 tablet (20 mg total) by mouth daily.   VITAMIN D  PO Take 1 capsule by mouth daily.        Follow-up Information     Sparks, Rosalynn Come, MD Follow up.   Specialty: Internal Medicine Why: Hospital follow up Contact information: [address] [facility] [place] [address] [phone]                 Discharge Exam: Cleavon Curls Weights   01/28/24 1727 01/30/24 0500  Weight: 60.8 kg 61.4 kg   General.  Frail and hard of hearing elderly lady, in no acute distress. Pulmonary.  Lungs clear bilaterally, normal respiratory effort. CV.  Regular rate and rhythm, no JVD, rub or murmur. Abdomen.  Soft, nontender, nondistended, BS positive. CNS.  Alert and oriented .  No focal neurologic deficit. Extremities.  Left BKA, no edema on right  Condition at discharge: stable  The results of significant diagnostics from this hospitalization (including imaging, microbiology, ancillary and laboratory) are listed below for reference.   Imaging Studies: No results found.  Microbiology: Results for orders placed or performed during the hospital encounter of 12/08/23  Resp panel by RT-PCR (RSV, Flu A&B, Covid) Anterior Nasal Swab     Status: None   Collection Time: 12/10/23  5:17 PM   Specimen: Anterior Nasal Swab  Result Value Ref Range Status   SARS Coronavirus 2 by RT PCR NEGATIVE NEGATIVE Final    Comment: (NOTE) SARS-CoV-2 target nucleic acids are NOT DETECTED.  The SARS-CoV-2 RNA is generally detectable in upper respiratory specimens during the acute phase of infection. The lowest concentration of SARS-CoV-2 viral copies this assay can detect is 138 copies/mL. A negative result does not preclude SARS-Cov-2 infection and should not be used as the sole basis for treatment or other patient management decisions. A negative result may occur with  improper specimen collection/handling, submission of specimen other than nasopharyngeal swab, presence of viral mutation(s) within the areas targeted by this assay, and inadequate number of viral copies(<138 copies/mL). A negative result must be combined with clinical observations, patient history, and epidemiological information. The expected result is Negative.  Fact Sheet for Patients:  BloggerCourse.com  Fact Sheet  for Healthcare Providers:  SeriousBroker.it  This test is no t yet approved or cleared by the [place]  FDA and  has been authorized for detection and/or diagnosis of SARS-CoV-2 by FDA under an Emergency Use Authorization (EUA). This EUA will remain  in effect (meaning this test can be used) for the duration of the COVID-19 declaration under Section 564(b)(1) of the Act, 21 U.S.C.section 360bbb-3(b)(1), unless the authorization is terminated  or revoked sooner.       Influenza A by PCR NEGATIVE NEGATIVE Final   Influenza B by PCR NEGATIVE NEGATIVE Final    Comment: (NOTE) The Xpert Xpress SARS-CoV-2/FLU/RSV plus assay is intended as an aid in the diagnosis of influenza from Nasopharyngeal swab specimens and should not be used as a sole basis for treatment. Nasal washings and aspirates are unacceptable for Xpert Xpress SARS-CoV-2/FLU/RSV testing.  Fact Sheet for Patients: BloggerCourse.com  Fact Sheet  for Healthcare Providers: SeriousBroker.it  This test is not yet approved or cleared by the [place]  FDA and has been authorized for detection and/or diagnosis of SARS-CoV-2 by FDA under an Emergency Use Authorization (EUA). This EUA will remain in effect (meaning this test can be used) for the duration of the COVID-19 declaration under Section 564(b)(1) of the Act, 21 U.S.C. section 360bbb-3(b)(1), unless the authorization is terminated or revoked.     Resp Syncytial Virus by PCR NEGATIVE NEGATIVE Final    Comment: (NOTE) Fact Sheet for Patients: BloggerCourse.com  Fact Sheet for Healthcare Providers: SeriousBroker.it  This test is not yet approved or cleared by the [place]  FDA and has been authorized for detection and/or diagnosis of SARS-CoV-2 by FDA under an Emergency Use Authorization (EUA). This EUA will remain in effect (meaning  this test can be used) for the duration of the COVID-19 declaration under Section 564(b)(1) of the Act, 21 U.S.C. section 360bbb-3(b)(1), unless the authorization is terminated or revoked.  Performed at [facility], [address] [place], [place] [address]   Culture, blood (Routine X 2) w Reflex to ID Panel     Status: None   Collection Time: 12/11/23  1:00 PM   Specimen: BLOOD  Result Value Ref Range Status   Specimen Description BLOOD BLOOD RIGHT ARM  Final   Special Requests   Final    BOTTLES DRAWN AEROBIC AND ANAEROBIC Blood Culture adequate volume   Culture   Final    NO GROWTH 5 DAYS Performed at [facility], [address] [place], [place] [address]    Report Status 12/16/2023 FINAL  Final  Culture, blood (Routine X 2) w Reflex to ID Panel     Status: None   Collection Time: 12/11/23  1:17 PM   Specimen: BLOOD  Result Value Ref Range Status   Specimen Description BLOOD BLOOD RIGHT HAND  Final   Special Requests   Final    Blood Culture results may not be optimal due to an inadequate volume of blood received in culture bottles   Culture   Final    NO GROWTH 5 DAYS Performed at [facility], [address] [place], [place] [address]    Report Status 12/16/2023 FINAL  Final  Urine Culture (for pregnant, neutropenic or urologic patients or patients with an indwelling urinary catheter)     Status: Abnormal   Collection Time: 12/11/23  4:50 PM   Specimen: Urine, Clean Catch  Result Value Ref Range Status   Specimen Description   Final    URINE, CLEAN CATCH Performed at [facility], [address], [place], [place] [address]    Special Requests   Final    NONE Performed at [facility], [address] [place], [place] [address]    Culture >=100,000 COLONIES/mL KLEBSIELLA PNEUMONIAE (A)  Final   Report Status 12/13/2023 FINAL  Final   Organism ID, Bacteria KLEBSIELLA PNEUMONIAE (A)  Final      Susceptibility    Klebsiella pneumoniae - MIC*    AMPICILLIN >=32 RESISTANT Resistant     CEFAZOLIN  <=4 SENSITIVE Sensitive     CEFEPIME  <=0.12 SENSITIVE Sensitive     CEFTRIAXONE  <=0.25 SENSITIVE Sensitive     CIPROFLOXACIN  <=0.25 SENSITIVE Sensitive     GENTAMICIN  <=1 SENSITIVE Sensitive     IMIPENEM  <=0.25 SENSITIVE Sensitive     NITROFURANTOIN 64 INTERMEDIATE Intermediate     TRIMETH/SULFA <=20 SENSITIVE Sensitive     AMPICILLIN/SULBACTAM 8 SENSITIVE  Sensitive     PIP/TAZO <=4 SENSITIVE Sensitive ug/mL    * >=100,000 COLONIES/mL KLEBSIELLA PNEUMONIAE    Labs: CBC: Recent Labs  Lab 01/28/24 1753 01/29/24 0436  WBC 10.3 11.5*  HGB 10.3* 9.6*  HCT 33.0* 30.7*  MCV 75.0* 74.0*  PLT 281 234   Basic Metabolic Panel: Recent Labs  Lab 01/28/24 1753 01/28/24 2222 01/29/24 0436 01/29/24 1446 01/30/24 0341  NA 135  --  136  --  135  K 6.7* 5.4* 5.7* 6.0* 4.9  CL 108  --  108  --  109  CO2 20*  --  21*  --  20*  GLUCOSE 214*  --  273*  --  329*  BUN 38*  --  36*  --  33*  CREATININE 1.77*  --  1.58*  --  1.49*  CALCIUM  9.1  --  9.1  --  9.4  PHOS  --   --   --   --  4.6   Liver Function Tests: Recent Labs  Lab 01/28/24 1753 01/30/24 0341  AST 16  --   ALT 12  --   ALKPHOS 83  --   BILITOT 0.4  --   PROT 7.5  --   ALBUMIN  3.2* 2.7*   CBG: Recent Labs  Lab 01/28/24 2347 01/29/24 1251 01/29/24 1633 01/29/24 1959 01/30/24 0724  GLUCAP 252* 242* 192* 268* 273*    Discharge time spent: greater than 30 minutes.  This record has been created using Conservation officer, historic buildings. Errors have been sought and corrected,but may not always be located. Such creation errors do not reflect on the standard of care.   Signed: Luna Salinas, MD [facility] 01/30/2024

## 2024-01-30 NOTE — TOC Transition Note (Signed)
 Transition of Care [facility]) - Discharge Note   Patient Details  Name: Cassandra [name] MRN: [id number] Date of Birth: [birth year]  [facility] CM/SW Contact:  Crayton Docker, RN 01/30/2024, 1:41 PM   Clinical Narrative:     Discharge orders noted for home/self care. Private transportation arranged.   Final next level of care: Home/Self Care Barriers to Discharge: No Barriers Identified   Patient Goals and CMS Choice    Home/self care  Discharge Placement     Home/self care           Discharge Plan and Services Additional resources added to the After Visit Summary for Social Drivers of Health (SDOH) Interventions SDOH Screenings   Food Insecurity: No Food Insecurity (01/28/2024)  Housing: Low Risk  (01/28/2024)  Transportation Needs: No Transportation Needs (01/28/2024)  Utilities: Not At Risk (01/28/2024)  Depression (PHQ2-9): Low Risk  (03/20/2023)  Financial Resource Strain: Low Risk  (10/04/2018)  Physical Activity: Unknown (10/04/2018)  Social Connections: Socially Isolated (01/28/2024)  Tobacco Use: Medium Risk (01/28/2024)     Readmission Risk Interventions     No data to display

## 2024-01-30 NOTE — Assessment & Plan Note (Signed)
 Seems stable -Continue home Eliquis  and metoprolol 

## 2024-01-30 NOTE — Assessment & Plan Note (Signed)
 2D echo on 08/20/2022 showed EF of 45 to 50% with grade 2 diastolic dysfunction.  Patient does not have SOB, no leg edema.  CHF is compensated. -Hold torsemide  due to worsening renal function-instructed to resume in 2 days.

## 2024-01-30 NOTE — Assessment & Plan Note (Signed)
 Likely due to dehydration and continuation of torsemide  and Micardis.  Slowly improving renal function, did receive some IV fluid - Micardis was discontinued -Holding torsemide  for another day - Monitor renal function

## 2024-01-30 NOTE — Assessment & Plan Note (Signed)
 Continue Crestor

## 2024-01-30 NOTE — Assessment & Plan Note (Signed)
 Uncontrolled with hyperglycemia and recent A1c of 12.5. -SSI - Will resume home regimen on discharge with a close outpatient follow-up with PCP

## 2024-01-30 NOTE — Plan of Care (Signed)
  Problem: Health Behavior/Discharge Planning: Goal: Ability to manage health-related needs will improve Outcome: Progressing   Problem: Clinical Measurements: Goal: Diagnostic test results will improve Outcome: Progressing   Problem: Nutrition: Goal: Adequate nutrition will be maintained Outcome: Progressing   Problem: Coping: Goal: Level of anxiety will decrease Outcome: Progressing   

## 2024-02-03 ENCOUNTER — Ambulatory Visit: Payer: Medicare Other | Admitting: Physical Therapy

## 2024-02-05 ENCOUNTER — Ambulatory Visit: Payer: Medicare Other | Admitting: Physical Therapy

## 2024-02-10 ENCOUNTER — Ambulatory Visit: Payer: Medicare Other | Admitting: Physical Therapy

## 2024-02-12 ENCOUNTER — Ambulatory Visit: Payer: Medicare Other | Admitting: Physical Therapy

## 2024-02-17 ENCOUNTER — Ambulatory Visit: Payer: Medicare Other | Admitting: Physical Therapy

## 2024-02-19 ENCOUNTER — Ambulatory Visit: Payer: Medicare Other | Admitting: Physical Therapy

## 2024-02-24 ENCOUNTER — Ambulatory Visit: Payer: Medicare Other | Admitting: Physical Therapy

## 2024-02-26 ENCOUNTER — Ambulatory Visit: Payer: Medicare Other | Admitting: Physical Therapy

## 2024-03-02 ENCOUNTER — Ambulatory Visit: Payer: Medicare Other | Admitting: Physical Therapy

## 2024-03-04 ENCOUNTER — Ambulatory Visit: Payer: Medicare Other | Admitting: Physical Therapy

## 2024-03-09 ENCOUNTER — Ambulatory Visit: Payer: Medicare Other | Admitting: Physical Therapy

## 2024-03-11 ENCOUNTER — Ambulatory Visit: Payer: Medicare Other | Admitting: Physical Therapy

## 2024-03-16 ENCOUNTER — Ambulatory Visit: Payer: Medicare Other | Admitting: Physical Therapy

## 2024-03-18 ENCOUNTER — Ambulatory Visit: Payer: Medicare Other | Admitting: Physical Therapy

## 2024-03-23 ENCOUNTER — Ambulatory Visit: Payer: Medicare Other | Admitting: Physical Therapy

## 2024-03-25 ENCOUNTER — Ambulatory Visit: Payer: Medicare Other | Admitting: Physical Therapy

## 2024-03-30 ENCOUNTER — Ambulatory Visit: Admitting: Physical Therapy

## 2024-04-06 ENCOUNTER — Ambulatory Visit: Admitting: Physical Therapy

## 2024-04-13 ENCOUNTER — Ambulatory Visit: Admitting: Physical Therapy

## 2024-04-20 ENCOUNTER — Ambulatory Visit: Admitting: Physical Therapy

## 2024-04-26 ENCOUNTER — Ambulatory Visit: Attending: Internal Medicine | Admitting: Physical Therapy

## 2024-04-26 DIAGNOSIS — R2689 Other abnormalities of gait and mobility: Secondary | ICD-10-CM | POA: Diagnosis present

## 2024-04-26 DIAGNOSIS — R269 Unspecified abnormalities of gait and mobility: Secondary | ICD-10-CM | POA: Diagnosis present

## 2024-04-26 DIAGNOSIS — Z89512 Acquired absence of left leg below knee: Secondary | ICD-10-CM | POA: Insufficient documentation

## 2024-04-26 DIAGNOSIS — M6281 Muscle weakness (generalized): Secondary | ICD-10-CM | POA: Insufficient documentation
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
## 2024-04-27 ENCOUNTER — Ambulatory Visit: Admitting: Physical Therapy

## 2024-04-29 ENCOUNTER — Ambulatory Visit: Admitting: Physical Therapy

## 2024-04-29 DIAGNOSIS — R269 Unspecified abnormalities of gait and mobility: Secondary | ICD-10-CM

## 2024-04-29 DIAGNOSIS — R2689 Other abnormalities of gait and mobility: Secondary | ICD-10-CM

## 2024-04-29 DIAGNOSIS — Z89512 Acquired absence of left leg below knee: Secondary | ICD-10-CM
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
## 2024-05-04 ENCOUNTER — Ambulatory Visit: Admitting: Physical Therapy

## 2024-05-05 ENCOUNTER — Ambulatory Visit: Admitting: Physical Therapy

## 2024-05-11 ENCOUNTER — Ambulatory Visit

## 2024-05-11 ENCOUNTER — Ambulatory Visit: Admitting: Physical Therapy
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
## 2024-05-18 ENCOUNTER — Ambulatory Visit: Admitting: Physical Therapy

## 2024-05-18 ENCOUNTER — Ambulatory Visit: Attending: Internal Medicine | Admitting: Physical Therapy

## 2024-05-18 DIAGNOSIS — M6281 Muscle weakness (generalized): Secondary | ICD-10-CM | POA: Insufficient documentation

## 2024-05-18 DIAGNOSIS — R269 Unspecified abnormalities of gait and mobility: Secondary | ICD-10-CM | POA: Insufficient documentation

## 2024-05-18 DIAGNOSIS — Z89512 Acquired absence of left leg below knee: Secondary | ICD-10-CM | POA: Diagnosis present

## 2024-05-18 DIAGNOSIS — R2689 Other abnormalities of gait and mobility: Secondary | ICD-10-CM | POA: Diagnosis present
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
## 2024-05-20 ENCOUNTER — Ambulatory Visit: Admitting: Physical Therapy

## 2024-05-25 ENCOUNTER — Ambulatory Visit: Admitting: Physical Therapy

## 2024-05-25 DIAGNOSIS — R2689 Other abnormalities of gait and mobility: Secondary | ICD-10-CM

## 2024-05-25 DIAGNOSIS — Z89512 Acquired absence of left leg below knee: Secondary | ICD-10-CM

## 2024-05-25 DIAGNOSIS — R269 Unspecified abnormalities of gait and mobility: Secondary | ICD-10-CM | POA: Diagnosis not present
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
## 2024-05-27 ENCOUNTER — Encounter

## 2024-05-27 ENCOUNTER — Ambulatory Visit: Admitting: Physical Therapy

## 2024-05-31 ENCOUNTER — Encounter

## 2024-06-01 ENCOUNTER — Ambulatory Visit: Admitting: Physical Therapy

## 2024-06-01 DIAGNOSIS — Z89512 Acquired absence of left leg below knee: Secondary | ICD-10-CM

## 2024-06-01 DIAGNOSIS — R2689 Other abnormalities of gait and mobility: Secondary | ICD-10-CM

## 2024-06-01 DIAGNOSIS — M6281 Muscle weakness (generalized): Secondary | ICD-10-CM

## 2024-06-01 DIAGNOSIS — R269 Unspecified abnormalities of gait and mobility: Secondary | ICD-10-CM | POA: Diagnosis not present
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
## 2024-06-04 ENCOUNTER — Encounter: Admitting: Physical Therapy

## 2024-06-08 ENCOUNTER — Ambulatory Visit: Attending: Internal Medicine | Admitting: Physical Therapy

## 2024-06-08 ENCOUNTER — Ambulatory Visit: Admitting: Physical Therapy

## 2024-06-08 DIAGNOSIS — R2689 Other abnormalities of gait and mobility: Secondary | ICD-10-CM | POA: Diagnosis present

## 2024-06-08 DIAGNOSIS — Z89512 Acquired absence of left leg below knee: Secondary | ICD-10-CM | POA: Diagnosis present

## 2024-06-08 DIAGNOSIS — M6281 Muscle weakness (generalized): Secondary | ICD-10-CM | POA: Diagnosis present

## 2024-06-08 DIAGNOSIS — R269 Unspecified abnormalities of gait and mobility: Secondary | ICD-10-CM | POA: Insufficient documentation
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
## 2024-06-11 ENCOUNTER — Ambulatory Visit: Admitting: Physical Therapy

## 2024-06-14 ENCOUNTER — Ambulatory Visit: Admitting: Physical Therapy

## 2024-06-15 ENCOUNTER — Ambulatory Visit: Admitting: Physical Therapy

## 2024-06-16 ENCOUNTER — Ambulatory Visit: Admitting: Physical Therapy
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
## 2024-06-21 ENCOUNTER — Ambulatory Visit: Admitting: Physical Therapy
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
## 2024-06-22 ENCOUNTER — Ambulatory Visit: Admitting: Physical Therapy

## 2024-06-23 ENCOUNTER — Encounter: Admitting: Physical Therapy

## 2024-06-28 ENCOUNTER — Encounter: Admitting: Physical Therapy

## 2024-06-30 ENCOUNTER — Encounter: Admitting: Physical Therapy

## 2024-07-06 ENCOUNTER — Ambulatory Visit: Admitting: Physical Therapy

## 2024-07-06 NOTE — Progress Notes (Signed)
 Chief Complaint  Patient presents with  . Urinary Tract Infection    HPI  Cassandra [name] is a 89 year old female here for an acute issue of vaginal irritation and vulvar irritation.  States she has been having this issue for several months.  Whenever she urinates as she wears depends a will quietly irritate.  Has not noticed any erythema.  No drainage of the vaginal canal.  Denies any fevers or chills.  No actual dysuria when she has urination.  No flank pain or abdominal pains.  Tried Desitin which seems to improve symptoms.    ROS  Review of systems is unremarkable for any active cardiac, respiratory, GI, GU, hematologic, neurologic, dermatologic, HEENT, or psychiatric symptoms except as noted above.  No fevers, chills, or constitutional symptoms.   Current Outpatient Medications  Medication Sig Dispense Refill  . acetaminophen  (TYLENOL ) 325 MG tablet Take 325 mg by mouth Acetaminophen  Tablet 325 MG active [id number] RXNORM 2 tablet Oral as needed PRN Give 2 tablet by mouth every 6 hours as needed for mild Pain or temp &gt;100.5 Do not exceed 3 grams of APAP/day from all sources. 12/24/2023 -    . albuterol  MDI, PROVENTIL , VENTOLIN , PROAIR , HFA 90 mcg/actuation inhaler Inhale 2 inhalations into the lungs every 6 (six) hours as needed for Wheezing 1 each 3  . benzonatate (TESSALON) 200 MG capsule Take 1 capsule (200 mg total) by mouth 3 (three) times daily as needed for Cough for up to 90 days 180 capsule 0  . cholecalciferol  (VITAMIN D3) 1000 unit tablet Take by mouth    . docusate (COLACE) 100 MG capsule Take 100 mg by mouth 2 (two) times daily    . DULoxetine (CYMBALTA) 20 MG DR capsule Take 1 capsule (20 mg total) by mouth once daily 30 capsule 11  . ELIQUIS  5 mg tablet TAKE ONE (1) TABLET BY MOUTH TWO TIMES PER DAY 180 tablet 1  . ferrous fum-vit C-vit B12-FA 460-60-0.01-1 mg Cap Take 1 capsule by mouth every morning    . FUROsemide  (LASIX ) 20 MG tablet Take 1 tablet (20 mg total)  by mouth once daily 30 tablet 3  . hydrOXYzine  HCL (ATARAX ) 10 MG tablet TAKE ONE TABLET BY MOUTH 3 TIMES DAILY AS NEEDED FOR UP TO 90 DAYS 270 tablet 1  . insulin  ASPART (NOVOLOG  FLEXPEN U-100 INSULIN ) pen injector (concentration 100 units/mL) INJECT 5 UNITS WITH MEALS; OK TO ADD SLIDING SCALE AS NECESSARY **MAXIMUM DAILY DOSE IS 50 UNITS** (Patient taking differently: INJECT 12 UNITS WITH MEALS; OK TO ADD SLIDING SCALE AS NECESSARY **MAXIMUM DAILY DOSE IS 50 UNITS**) 15 mL 12  . insulin  GLARGINE (LANTUS  SOLOSTAR U-100 INSULIN ) pen injector (concentration 100 units/mL) Inject 14 Units subcutaneously at bedtime 15 mL 11  . metoprolol  tartrate (LOPRESSOR ) 25 MG tablet take one tablet by mouth twice daily 60 tablet 5  . nortriptyline (PAMELOR) 10 MG capsule Take 1 capsule (10 mg total) by mouth at bedtime 30 capsule 11  . ONETOUCH ULTRA BLUE TEST STRIP test strip 1 STRIP VIA METER 3 TIMES A DAY AS DIRECTED 100 each 5  . sodium bicarbonate  650 MG tablet TAKE ONE TABLET TWICE DAILY 100 tablet 1  . cetirizine (ZYRTEC) 10 MG tablet Take 1 tablet (10 mg total) by mouth once daily 30 tablet 11  . levalbuterol (XOPENEX HFA) inhaler Inhale 2 inhalations into the lungs every 4 (four) hours as needed for Wheezing (Patient not taking: Reported on 07/06/2024) 15 g 3  . levothyroxine  (SYNTHROID ) 100  MCG tablet Take 1 tablet (100 mcg total) by mouth once daily Take on an empty stomach with a glass of water at least 30-60 minutes before breakfast. 30 tablet 3  . pantoprazole  (PROTONIX ) 40 MG DR tablet Take 1 tablet (40 mg total) by mouth once daily 30 tablet 11  . rosuvastatin  (CRESTOR ) 10 MG tablet TAKE 1 TABLET BY MOUTH DAILY 90 tablet 4   No current facility-administered medications for this visit.    Allergies as of 07/06/2024 - Reviewed 07/06/2024  Allergen Reaction Noted  . Baclofen Hallucination 06/07/2022  . Levofloxacin Hallucination and Other (See Comments) 07/10/2021    Patient Active Problem List   Diagnosis  . Acquired hypothyroidism  . Hyperlipidemia associated with type 2 diabetes mellitus (CMS-HCC)  . COPD with asthma , unspecified (CMS-HCC)  . Type II diabetes mellitus with complication (CMS/HHS-HCC)  . Hypertension associated with diabetes (CMS-HCC)  . Headache(784.0)  . Coronary atherosclerosis of native coronary artery  . Hypersomnia with sleep apnea  . B12 deficiency  . Vitamin D  deficiency  . Long-term insulin  use (CMS/HHS-HCC)  . Syncope  . Thoracic back pain  . Abnormal UGI series  . Moderate episode of recurrent major depressive disorder (CMS-HCC)  . Pacemaker  . Dizziness  . Acute vomiting  . Falls frequently  . Visual hallucinations  . Carotid stenosis  . Loss of weight  . History of nephrolithiasis  . Urge incontinence  . Chronic hypoxemic respiratory failure (CMS/HHS-HCC)  . Edema leg  . Stage 3b chronic kidney disease (CMS-HCC)    Past Medical History:  Diagnosis Date  . Abnormal UGI series 03/31/2018  . Anxiety   . Arrhythmia   . Arthritis   . Cancer (CMS/HHS-HCC)    breast  . Carotid artery occlusion   . Carotid bruit   . Colon polyp   . COPD (chronic obstructive pulmonary disease) (CMS/HHS-HCC)   . Coronary artery disease   . Diabetes mellitus type 2, uncomplicated (CMS/HHS-HCC)   . Gout   . Headache   . History of bone density study 02/17/2013, 12/12/2009  . History of breast cancer in female   . History of cardiac dysrhythmia   . Hyperlipidemia   . Hypertension   . Neuropathy   . Non-cardiac chest pain   . Sleep apnea   . Syncope and collapse   . Thoracic back pain 01/23/2018  . Thyroid  disease   . Tobacco abuse   . Venous disease     Past Surgical History:  Procedure Laterality Date  . MASTECTOMY Bilateral 1992   with reconstruction.  . COLONOSCOPY  07/20/1997   Adenomatous Polyp  . EGD  06/17/2007   No repeat per RTE (dw)  . CHOLECYSTECTOMY  04/2011  . Breast implant replaced  01/2012  . COLONOSCOPY  08/01/2014    Adenomatous Polyps: No repeat due to age per RTE (dw)  . CYSTOSCOPY WITH RETROGRADE PYELOGRAM/URETERAL STENT PLACEMENT  08/17/2017  . CYSTOSCOPY/RETROGRADE/URETEROSCOPY/STONE EXTRACTION WITH BASKET Left 09/16/2017  . kyphoplasty T11  02/03/2018   Dr.Menz  . ANGIOGRAPHY VISCERAL  11/03/2019  . ankle surgery Left 02/24/2021  . IRRIGATION AND DEBRIDEMENT EXTREMITY Left 02/24/2021  . ORIF ANKLE FRACTURE Left 02/26/2021  . ANKLE FUSION n/a  10/10/2021  . IRRIGATION AND DEBRIDEMENT LEFT ANKLE Left 12/21/2021  . DEBRIDEMENT OF LEFT ANKLE INFECTION Left 05/03/2022  . BELOW KNEE LEG AMPUTATION Left 01/03/2023  . APPENDECTOMY    . CAROTID ENDARTERECTOMY    . CAROTID STENT    . CARPAL TUNNEL RELEASE    .  CATARACT EXTRACTION    . COLONOSCOPY  05/13/2001, 06/17/2007   [facility] Adenomatous Polyp  . ENDOSCOPIC CARPAL TUNNEL RELEASE Bilateral    and trigger finger repair; Dr. Billee  . HERNIA REPAIR     umbilical, Dr. Ubaldo Cleveland  . LAPAROSCOPIC TUBAL LIGATION    . Resection of a lump on her left hand    . Trigger finger repair      Vitals:   07/06/24 1339  BP: 132/72  Pulse: 84  SpO2: 93%  Height: 149.9 cm (4' 11)  PainSc: 0-No pain   Body mass index is 28.07 kg/m.  Exam BP 132/72 (BP Location: Left upper arm, Patient Position: Sitting, BP Cuff Size: Adult)   Pulse 84   Ht 149.9 cm (4' 11)   LMP  (LMP Unknown)   SpO2 93%   BMI 28.07 kg/m   General. Well appearing; NAD; VS reviewed     Eyes. Sclera and conjunctiva clear Neck. Supple. No swelling, masses, thyroid  normal size, no masses palpated.   Lungs. Respirations unlabored; clear to auscultation bilaterally Cardiovascular. Heart regular rate and rhythm without murmurs, gallops, or rubs Extremities: No edema.  Amputation of the left leg. GU: External female genitalia without rash or erythema.  Within the labial folds there is some mild erythema.  No drainage from the vaginal canal but in the 5 o'clock position just at the  opening of the vaginal canal there is a small area of erythema and what appears to be a small orifice.  Again no drainage from this area.  Only minimally tender to palpate but no fluctuance or induration. Skin. Normal color and turgor  Robyn Yow used as chaperone for exam. Assessment & Plan   Vaginal irritation Acute; vaginal irritation could be from urine incontinence.  Will give her nystatin  cream to apply.  There was a small orifice/ulceration within the vaginal canal.  Will place referral to GYN for further evaluation given symptoms have been present for several months.  Wet prep pending.  Urinary incontinence, unspecified type Ongoing; likely causing some fungal irritation.  Assessment plan as above.  F/U: Patient to follow-up as needed.  JASON HESTLE WHITAKER, PA  This note has been created using automated tools and reviewed for accuracy by JASON HESTLE WHITAKER.   Note: This dictation was prepared with Dragon dictation along with smaller phrase technology. Any transcriptional errors that result from this process are unintentional.

## 2024-07-07 ENCOUNTER — Ambulatory Visit: Admitting: Physical Therapy

## 2024-07-09 ENCOUNTER — Other Ambulatory Visit: Payer: Self-pay

## 2024-07-09 ENCOUNTER — Emergency Department
Admission: EM | Admit: 2024-07-09 | Discharge: 2024-07-09 | Disposition: A | Discharge status: Home / Self Care | Attending: Emergency Medicine | Admitting: Emergency Medicine

## 2024-07-09 DIAGNOSIS — I959 Hypotension, unspecified: Secondary | ICD-10-CM | POA: Diagnosis not present

## 2024-07-09 DIAGNOSIS — B9689 Other specified bacterial agents as the cause of diseases classified elsewhere: Secondary | ICD-10-CM | POA: Diagnosis not present

## 2024-07-09 DIAGNOSIS — I10 Essential (primary) hypertension: Secondary | ICD-10-CM | POA: Insufficient documentation

## 2024-07-09 DIAGNOSIS — N390 Urinary tract infection, site not specified: Secondary | ICD-10-CM | POA: Insufficient documentation

## 2024-07-09 DIAGNOSIS — E119 Type 2 diabetes mellitus without complications: Secondary | ICD-10-CM | POA: Insufficient documentation

## 2024-07-09 DIAGNOSIS — E039 Hypothyroidism, unspecified: Secondary | ICD-10-CM | POA: Diagnosis not present

## 2024-07-09 DIAGNOSIS — R42 Dizziness and giddiness: Secondary | ICD-10-CM | POA: Diagnosis present

## 2024-07-09 LAB — BASIC METABOLIC PANEL WITH GFR
Anion gap: 13 (ref 5–15)
BUN: 33 mg/dL — ABNORMAL HIGH (ref 8–23)
CO2: 23 mmol/L (ref 22–32)
Calcium: 8.6 mg/dL — ABNORMAL LOW (ref 8.9–10.3)
Chloride: 100 mmol/L (ref 98–111)
Creatinine, Ser: 1.49 mg/dL — ABNORMAL HIGH (ref 0.44–1.00)
GFR, Estimated: 33 mL/min — ABNORMAL LOW (ref >60)
Glucose, Bld: 362 mg/dL — ABNORMAL HIGH (ref 70–99)
Potassium: 4.7 mmol/L (ref 3.5–5.1)
Sodium: 136 mmol/L (ref 135–145)

## 2024-07-09 LAB — URINALYSIS, ROUTINE W REFLEX MICROSCOPIC
Bilirubin Urine: NEGATIVE
Glucose, UA: >=500 mg/dL — AB
Ketones, ur: NEGATIVE mg/dL
Nitrite: NEGATIVE
Protein, ur: NEGATIVE mg/dL
Specific Gravity, Urine: 1.008 (ref 1.005–1.030)
WBC, UA: >50 WBC/hpf (ref 0–5)
pH: 5 (ref 5.0–8.0)

## 2024-07-09 LAB — CBC
HCT: 35.6 % — ABNORMAL LOW (ref 36.0–46.0)
Hemoglobin: 11 g/dL — ABNORMAL LOW (ref 12.0–15.0)
MCH: 22.9 pg — ABNORMAL LOW (ref 26.0–34.0)
MCHC: 30.9 g/dL (ref 30.0–36.0)
MCV: 74 fL — ABNORMAL LOW (ref 80.0–100.0)
Platelets: 235 K/uL (ref 150–400)
RBC: 4.81 MIL/uL (ref 3.87–5.11)
RDW: 17.3 % — ABNORMAL HIGH (ref 11.5–15.5)
WBC: 10.5 K/uL (ref 4.0–10.5)
nRBC: 0 % (ref 0.0–0.2)

## 2024-07-09 LAB — TROPONIN I (HIGH SENSITIVITY): Troponin I (High Sensitivity): 10 ng/L (ref <18)

## 2024-07-09 MED ORDER — CEPHALEXIN 500 MG PO CAPS
500.0000 mg | ORAL_CAPSULE | Freq: Once | ORAL | Status: AC
  Administered 2024-07-09: 500 mg via ORAL
  Filled 2024-07-09: qty 1

## 2024-07-09 MED ORDER — CEPHALEXIN 500 MG PO CAPS: 500.0000 mg | ORAL_CAPSULE | Freq: Two times a day (BID) | ORAL | 0 refills | Status: AC

## 2024-07-09 NOTE — Discharge Instructions (Signed)
 Take the antibiotic as prescribed and finish the full 7-day course.  Follow-up with your primary care provider.  Return to the ER for new, worsening, or persistent low blood pressure readings, difficulty urinating, fever, vomiting, weakness, or any other new or worsening symptoms that concern you.

## 2024-07-09 NOTE — ED Notes (Signed)
 Lab called for blood draw.

## 2024-07-09 NOTE — ED Triage Notes (Signed)
 Pt reports went to PCP today and was referred to ER d/t hypotension. Endorses dizziness x1 week.

## 2024-07-09 NOTE — ED Provider Notes (Signed)
 [facility] Provider Note    Event Date/Time   First MD Initiated Contact with Patient 07/09/24 1658     (approximate)   History   Hypotension and Dizziness   HPI  Cassandra [name] is a 89 y.o. female with a history of hypertension, hyperlipidemia, diabetes, hypothyroidism who presents with a blood pressure reading of 104 systolic this morning after she got up.  She has been feeling somewhat dizzy and lightheaded in the mornings for about the last week.  She denies any lightheadedness currently.  She denies any chest pain or difficulty breathing.  She has no fever or chills or any vomiting or diarrhea.  She called the nurse line for her doctor's office and was instructed to come to the ED for evaluation.  I reviewed the past medical records.  The patient's most recent outpatient visits in our system were with internal medicine on 9/3 for follow-up of her chronic conditions, endocrinology on 10/15 for follow-up of her diabetes, and family medicine on 11/4 for vulvar irritation.   Physical Exam   Triage Vital Signs: ED Triage Vitals  Encounter Vitals Group     BP 07/09/24 1419 (!) 112/57     Girls Systolic BP Percentile --      Girls Diastolic BP Percentile --      Boys Systolic BP Percentile --      Boys Diastolic BP Percentile --      Pulse Rate 07/09/24 1419 63     Resp 07/09/24 1419 18     Temp 07/09/24 1419 97.6 F (36.4 C)     Temp src --      SpO2 07/09/24 1419 98 %     Weight 07/09/24 1420 124 lb (56.2 kg)     Height 07/09/24 1420 4' 11 (1.499 m)     Head Circumference --      Peak Flow --      Pain Score 07/09/24 1420 0     Pain Loc --      Pain Education --      Exclude from Growth Chart --     Most recent vital signs: Vitals:   07/09/24 1730 07/09/24 1800  BP: (!) 162/62 (!) 149/73  Pulse: (!) 59 63  Resp:  18  Temp:    SpO2: 91% 94%     General: Alert, well-appearing for age, no distress.  CV:  Good peripheral perfusion.   Resp:  Normal effort.  Lungs CTAB. Abd:  Left and nontender.  No distention.  Other:  Moist mucous membranes.  No peripheral edema.  Motor intact in all extremities.  EOMI.  PERRLA.  No facial droop.  Normal speech.   ED Results / Procedures / Treatments   Labs (all labs ordered are listed, but only abnormal results are displayed) Labs Reviewed  CBC - Abnormal; Notable for the following components:      Result Value   Hemoglobin 11.0 (*)    HCT 35.6 (*)    MCV 74.0 (*)    MCH 22.9 (*)    RDW 17.3 (*)    All other components within normal limits  BASIC METABOLIC PANEL WITH GFR - Abnormal; Notable for the following components:   Glucose, Bld 362 (*)    BUN 33 (*)    Creatinine, Ser 1.49 (*)    Calcium  8.6 (*)    GFR, Estimated 33 (*)    All other components within normal limits  URINALYSIS, ROUTINE W REFLEX MICROSCOPIC -  Abnormal; Notable for the following components:   Color, Urine YELLOW (*)    APPearance CLOUDY (*)    Glucose, UA >=500 (*)    Hgb urine dipstick SMALL (*)    Leukocytes,Ua LARGE (*)    Bacteria, UA RARE (*)    All other components within normal limits  TROPONIN I (HIGH SENSITIVITY)     EKG  ED ECG REPORT I, Waylon Cassis, the attending physician, personally viewed and interpreted this ECG.  Date: 07/09/2024 EKG Time: 1424 Rate: 65 Rhythm: atrial paced rhythm QRS Axis: normal Intervals: RBBB ST/T Wave abnormalities: normal Narrative Interpretation: no evidence of acute ischemia    RADIOLOGY    PROCEDURES:  Critical Care performed: No  Procedures   MEDICATIONS ORDERED IN ED: Medications  cephALEXin  (KEFLEX ) capsule 500 mg (has no administration in time range)     IMPRESSION / MDM / ASSESSMENT AND PLAN / ED COURSE  I reviewed the triage vital signs and the nursing notes.  89 year old female with PMH as noted above presents with borderline hypotension this morning along with some intermittent lightheadedness occurring mainly  in the mornings over the last week.  She is asymptomatic currently.  On exam she is overall well-appearing for age and her blood pressure has improved to the 140s systolic.  Her vital signs are otherwise normal.  Physical exam is unremarkable for acute findings.  Differential diagnosis includes, but is not limited to, medication side effect from her antihypertensives (metoprolol  currently) dehydration, hypovolemia, UTI, less likely cardiac etiology.  EKG is nonischemic.  BMP shows hyperglycemia with a stable creatinine and no other acute findings.  CBC is also consistent with baseline.  I have added on a troponin and urinalysis.  There is no indication for fluids at this time. Patient's presentation is most consistent with acute complicated illness / injury requiring diagnostic workup.  The patient is on the cardiac monitor to evaluate for evidence of arrhythmia and/or significant heart rate changes.  ----------------------------------------- 6:41 PM on 07/09/2024 -----------------------------------------  Troponin is negative.  There is no indication for serial enzymes at this time.  Urinalysis does show findings compatible with a UTI.  I did consider whether the patient may benefit from inpatient admission.  However her blood pressure has remained stable throughout the ED stay.  She is currently asymptomatic.  Her labs are unremarkable and there is no evidence of sepsis.  She feels well and is eager to go home.  Therefore, I feel that outpatient treatment is reasonable.  I have given her dose of Keflex  here and prescribed a 1 week course of Keflex .  She agrees to follow-up with her primary care provider.  I gave strict return precautions, and she expresses understanding.   FINAL CLINICAL IMPRESSION(S) / ED DIAGNOSES   Final diagnoses:  Urinary tract infection without hematuria, site unspecified  Hypotension, unspecified hypotension type     Rx / DC Orders   ED Discharge Orders           Ordered    cephALEXin  (KEFLEX ) 500 MG capsule  2 times daily        07/09/24 1841             Note:  This document was prepared using Dragon voice recognition software and may include unintentional dictation errors.    Cassis Waylon, MD 07/09/24 1842

## 2024-07-09 NOTE — ED Notes (Signed)
 Purewick placed to catch urine at pt request verse I&O cath.

## 2024-07-09 NOTE — ED Notes (Signed)
 Pt's husband up to desk multiple times complaining about wait time and inquiring when pt will be taken to room to see doctor. This RN continues to explain pt is still in the que to be see by our main side.

## 2024-07-09 NOTE — ED Notes (Signed)
 Pt was assisted into bed and changed out of a soiled brief.  Pt was made a high falls risk with no bundle in place. This RN placed arm band, and alarm activated. 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

## 2024-07-13 ENCOUNTER — Ambulatory Visit: Admitting: Physical Therapy
